# Patient Record
Sex: Female | Born: 1955 | Race: Black or African American | Hispanic: No | Smoking: Former smoker
Health system: Southern US, Community
[De-identification: ages and names within clinical notes are randomized; demographics above are authoritative.]

## PROBLEM LIST (undated history)

## (undated) DIAGNOSIS — M1990 Unspecified osteoarthritis, unspecified site: Secondary | ICD-10-CM

## (undated) DIAGNOSIS — J189 Pneumonia, unspecified organism: Secondary | ICD-10-CM

## (undated) DIAGNOSIS — D573 Sickle-cell trait: Secondary | ICD-10-CM

## (undated) DIAGNOSIS — C539 Malignant neoplasm of cervix uteri, unspecified: Secondary | ICD-10-CM

## (undated) DIAGNOSIS — E785 Hyperlipidemia, unspecified: Secondary | ICD-10-CM

## (undated) DIAGNOSIS — M109 Gout, unspecified: Secondary | ICD-10-CM

## (undated) DIAGNOSIS — Z720 Tobacco use: Secondary | ICD-10-CM

## (undated) DIAGNOSIS — F419 Anxiety disorder, unspecified: Secondary | ICD-10-CM

## (undated) DIAGNOSIS — E119 Type 2 diabetes mellitus without complications: Secondary | ICD-10-CM

## (undated) DIAGNOSIS — Z9889 Other specified postprocedural states: Secondary | ICD-10-CM

## (undated) DIAGNOSIS — K8020 Calculus of gallbladder without cholecystitis without obstruction: Secondary | ICD-10-CM

## (undated) DIAGNOSIS — Z9981 Dependence on supplemental oxygen: Secondary | ICD-10-CM

## (undated) DIAGNOSIS — G4733 Obstructive sleep apnea (adult) (pediatric): Secondary | ICD-10-CM

## (undated) DIAGNOSIS — J449 Chronic obstructive pulmonary disease, unspecified: Secondary | ICD-10-CM

## (undated) DIAGNOSIS — I10 Essential (primary) hypertension: Secondary | ICD-10-CM

## (undated) DIAGNOSIS — D649 Anemia, unspecified: Secondary | ICD-10-CM

## (undated) DIAGNOSIS — Z9989 Dependence on other enabling machines and devices: Secondary | ICD-10-CM

## (undated) DIAGNOSIS — I5032 Chronic diastolic (congestive) heart failure: Secondary | ICD-10-CM

## (undated) DIAGNOSIS — K219 Gastro-esophageal reflux disease without esophagitis: Secondary | ICD-10-CM

## (undated) DIAGNOSIS — R0902 Hypoxemia: Secondary | ICD-10-CM

## (undated) HISTORY — DX: Essential (primary) hypertension: I10

## (undated) HISTORY — DX: Obstructive sleep apnea (adult) (pediatric): G47.33

## (undated) HISTORY — DX: Other specified postprocedural states: Z98.890

## (undated) HISTORY — PX: CARDIAC CATHETERIZATION: SHX172

## (undated) HISTORY — DX: Dependence on other enabling machines and devices: Z99.89

## (undated) HISTORY — DX: Chronic obstructive pulmonary disease, unspecified: J44.9

## (undated) HISTORY — PX: TUBAL LIGATION: SHX77

## (undated) HISTORY — DX: Malignant neoplasm of cervix uteri, unspecified: C53.9

## (undated) HISTORY — PX: BREAST BIOPSY: SHX20

## (undated) HISTORY — DX: Chronic diastolic (congestive) heart failure: I50.32

---

## 1988-11-12 DIAGNOSIS — C539 Malignant neoplasm of cervix uteri, unspecified: Secondary | ICD-10-CM

## 1988-11-12 HISTORY — DX: Malignant neoplasm of cervix uteri, unspecified: C53.9

## 2007-03-16 ENCOUNTER — Emergency Department (HOSPITAL_COMMUNITY): Admission: EM | Admit: 2007-03-16 | Discharge: 2007-03-16 | Payer: Self-pay | Admitting: Emergency Medicine

## 2008-10-06 LAB — PULMONARY FUNCTION TEST

## 2010-07-31 LAB — PULMONARY FUNCTION TEST

## 2011-04-04 ENCOUNTER — Ambulatory Visit (INDEPENDENT_AMBULATORY_CARE_PROVIDER_SITE_OTHER): Payer: Self-pay | Admitting: Emergency Medicine

## 2011-04-04 ENCOUNTER — Encounter: Payer: Self-pay | Admitting: Emergency Medicine

## 2011-04-04 DIAGNOSIS — G4733 Obstructive sleep apnea (adult) (pediatric): Secondary | ICD-10-CM | POA: Insufficient documentation

## 2011-04-04 DIAGNOSIS — E119 Type 2 diabetes mellitus without complications: Secondary | ICD-10-CM | POA: Insufficient documentation

## 2011-04-04 DIAGNOSIS — J449 Chronic obstructive pulmonary disease, unspecified: Secondary | ICD-10-CM | POA: Insufficient documentation

## 2011-04-04 DIAGNOSIS — I10 Essential (primary) hypertension: Secondary | ICD-10-CM

## 2011-04-04 DIAGNOSIS — J309 Allergic rhinitis, unspecified: Secondary | ICD-10-CM | POA: Insufficient documentation

## 2011-04-04 MED ORDER — FLUTICASONE-SALMETEROL 250-50 MCG/DOSE IN AEPB: 1.0000 | INHALATION_SPRAY | Freq: Two times a day (BID) | RESPIRATORY_TRACT | Status: DC

## 2011-04-04 MED ORDER — TIOTROPIUM BROMIDE MONOHYDRATE 18 MCG IN CAPS: 18.0000 ug | ORAL_CAPSULE | Freq: Every day | RESPIRATORY_TRACT | Status: DC

## 2011-04-04 MED ORDER — TRIAMTERENE-HCTZ 37.5-25 MG PO CAPS: 1.0000 | ORAL_CAPSULE | Freq: Every day | ORAL | Status: DC

## 2011-04-04 NOTE — Progress Notes (Signed)
SATURATION QUALIFICATIONS:  Patient Saturations on Room Air at Rest = 94%  Patient Saturations on Room Air while Ambulating = 88%  Patient Saturations on 2 Liters of oxygen while Ambulating = 95%

## 2011-04-04 NOTE — Assessment & Plan Note (Signed)
Will fill her HTN meds until we can get her a local PCP

## 2011-04-04 NOTE — Assessment & Plan Note (Signed)
Needs to get copies of her PSG from [facility]

## 2011-04-04 NOTE — Patient Instructions (Addendum)
We will obtain copies of your records from [facility] Please continue your Advair and Spiriva Continue your oxygen as you are using it. We will refer you to a local DME company to get your oxygen.  Walking oximetry today We will fill your triamterene-HCTZ until you can get a local primary doctor You need to to quit smoking - continue your efforts to stop! Follow up with Dr Delton Coombes in 3 months

## 2011-04-04 NOTE — Assessment & Plan Note (Signed)
Walking oximetry today to qualify for O2 Refer to DME company for o2 and CPAP Advair + Spiriva Obtain old records

## 2011-04-04 NOTE — Progress Notes (Signed)
  Subjective:    Patient ID: Stephanie Schultz, female    DOB: June 01, 1956, 55 y.o.   MRN: [id number]  HPI 55 yo smoker (90 pk-yrs, has cut down to about 2 cig a day), hx HTN, borderline DM, allergies. Dx with COPD at [facility] in [place]. She was started on O2 in ~2009. She had PFT within the last year. She remains fairly active as long as she wears her O2. She can walk about 30-40 feet, has to stop when shopping to rest. She has daily cough, productive of white phlegm. She has rare exacerbations, last was in Jan 2012.   Review of Systems  HENT: Positive for ear pain.   Respiratory: Positive for cough and shortness of breath.   Gastrointestinal:       Heartburn  Musculoskeletal: Positive for joint swelling.  Neurological: Positive for headaches.  Psychiatric/Behavioral:       Anxiety   Past Medical History  Diagnosis Date  . Emphysema   . COPD (chronic obstructive pulmonary disease)   . OSA on CPAP   . Borderline diabetes   . Hypertension      Family History  Problem Relation Age of Onset  . Lung cancer Paternal Aunt   . Lung cancer Paternal Grandfather      History   Social History  . Marital Status: Single    Spouse Name: N/A    Number of Children: 3  . Years of Education: N/A   Occupational History  . Disabled     Emphysema   Social History Main Topics  . Smoking status: Current Everyday Smoker -- 0.2 packs/day for 35 years    Types: Cigarettes  . Smokeless tobacco: Never Used  . Alcohol Use: No  . Drug Use: No  . Sexually Active: Not on file   Other Topics Concern  . Not on file   Social History Narrative  . No narrative on file     No Known Allergies   No outpatient prescriptions prior to visit.        Objective:   Physical Exam Gen: Pleasant, obese, in no distress,  normal affect  ENT: No lesions,  mouth clear,  oropharynx clear, no postnasal drip  Neck: No JVD, no TMG, no carotid bruits  Lungs: No use of accessory muscles, no dullness to  percussion, clear without rales or rhonchi  Cardiovascular: RRR, heart sounds normal, no murmur or gallops, no peripheral edema  Musculoskeletal: No deformities, no cyanosis or clubbing  Neuro: alert, non focal  Skin: Warm, no lesions or rashes, old scars on B UE's      Assessment & Plan:  COPD (chronic obstructive pulmonary disease) Walking oximetry today to qualify for O2 Refer to DME company for o2 and CPAP Advair + Spiriva Obtain old records  Obstructive sleep apnea Needs to get copies of her PSG from [place]  Hypertension Will fill her HTN meds until we can get her a local PCP

## 2011-04-24 ENCOUNTER — Encounter: Payer: Self-pay | Admitting: Emergency Medicine

## 2011-04-25 ENCOUNTER — Telehealth: Payer: Self-pay | Admitting: Emergency Medicine

## 2011-04-25 NOTE — Telephone Encounter (Signed)
Per pt's chart, looks like this was already called in on 04/04/11 for # 30 x 11 refills. Called and spoke with Beth from [facility] and was informed they did indeed receive this rx but kept it on hold until pt was ready for refill.  Called and spoke with pt and informed her of this.  Nothing further needed.

## 2011-07-05 ENCOUNTER — Ambulatory Visit (INDEPENDENT_AMBULATORY_CARE_PROVIDER_SITE_OTHER): Payer: Self-pay | Admitting: Emergency Medicine

## 2011-07-05 ENCOUNTER — Encounter: Payer: Self-pay | Admitting: Emergency Medicine

## 2011-07-05 DIAGNOSIS — J4489 Other specified chronic obstructive pulmonary disease: Secondary | ICD-10-CM

## 2011-07-05 DIAGNOSIS — J449 Chronic obstructive pulmonary disease, unspecified: Secondary | ICD-10-CM

## 2011-07-05 DIAGNOSIS — G4733 Obstructive sleep apnea (adult) (pediatric): Secondary | ICD-10-CM

## 2011-07-05 MED ORDER — ALBUTEROL SULFATE HFA 108 (90 BASE) MCG/ACT IN AERS: 2.0000 | INHALATION_SPRAY | RESPIRATORY_TRACT | Status: DC | PRN

## 2011-07-05 MED ORDER — ALBUTEROL SULFATE (2.5 MG/3ML) 0.083% IN NEBU: 2.5000 mg | INHALATION_SOLUTION | RESPIRATORY_TRACT | Status: DC | PRN

## 2011-07-05 NOTE — Progress Notes (Signed)
  Subjective:    Patient ID: Stephanie Schultz, female    DOB: 1956-10-16, 55 y.o.   MRN: [id number]  HPI 55 yo smoker (90 pk-yrs, has cut down to about 2 cig a day), hx HTN, borderline DM, allergies. Dx with COPD at [facility] in [place]. She was started on O2 in ~2009. She had PFT within the last year. She remains fairly active as long as she wears her O2. She can walk about 30-40 feet, has to stop when shopping to rest. She has daily cough, productive of white phlegm. She has rare exacerbations, last was in Jan 2012.   ROV 07/05/11 -- COPD, OSA. Her data from [facility] showed she needs CPAP 10. We ordered O2 thru [facility]. She continues to smoke 3 cigs a day. She has been coughing more, having more sputum - yellow/white sputum.  She is having more dyspnea, more wheezing x 3 weeks. She has gained 30+ lbs since coming to [place]. She has not been wearing her O2 reliably.     Objective:   Physical Exam Gen: Pleasant, obese, in no distress,  normal affect  ENT: No lesions,  mouth clear,  oropharynx clear, no postnasal drip  Neck: No JVD, no TMG, no carotid bruits  Lungs: No use of accessory muscles, distant, no wheezes but she does have UA   Cardiovascular: RRR, heart sounds normal, no murmur or gallops, no peripheral edema  Musculoskeletal: No deformities, no cyanosis or clubbing  Neuro: alert, non focal  Skin: Warm, no lesions or rashes, old scars on B UE's      Assessment & Plan:  COPD (chronic obstructive pulmonary disease) Do not believe she is exacerbated at this time.  - encouraged her to use O2 more reloiably w exertion - Spiriva + Advair - need to get her prn albuterol nebs + HFA - rov 3 months   Obstructive sleep apnea Re[port from [facility] indicates she need CPAP 10cmH2O, will order maintenance thru [facility]

## 2011-07-05 NOTE — Patient Instructions (Signed)
Please continue Spiriva and Advair We will have [facility] maintain your CPAP, and will ask them to provide nebulized albuterol Please wear your oxygen with all exertion.  Follow up with Dr Delton Coombes in 3 months or sooner if you have any problems

## 2011-07-05 NOTE — Assessment & Plan Note (Signed)
Do not believe she is exacerbated at this time.  - encouraged her to use O2 more reloiably w exertion - Spiriva + Advair - need to get her prn albuterol nebs + HFA - rov 3 months

## 2011-07-05 NOTE — Assessment & Plan Note (Signed)
Re[port from [facility] indicates she need CPAP 10cmH2O, will order maintenance thru [facility]

## 2011-09-20 ENCOUNTER — Encounter: Payer: Self-pay | Admitting: Emergency Medicine

## 2011-09-20 ENCOUNTER — Ambulatory Visit (INDEPENDENT_AMBULATORY_CARE_PROVIDER_SITE_OTHER): Payer: Medicaid Other | Admitting: Emergency Medicine

## 2011-09-20 DIAGNOSIS — J449 Chronic obstructive pulmonary disease, unspecified: Secondary | ICD-10-CM

## 2011-09-20 DIAGNOSIS — Z23 Encounter for immunization: Secondary | ICD-10-CM

## 2011-09-20 DIAGNOSIS — R911 Solitary pulmonary nodule: Secondary | ICD-10-CM | POA: Insufficient documentation

## 2011-09-20 DIAGNOSIS — G4733 Obstructive sleep apnea (adult) (pediatric): Secondary | ICD-10-CM

## 2011-09-20 DIAGNOSIS — J984 Other disorders of lung: Secondary | ICD-10-CM

## 2011-09-20 DIAGNOSIS — K219 Gastro-esophageal reflux disease without esophagitis: Secondary | ICD-10-CM

## 2011-09-20 DIAGNOSIS — J309 Allergic rhinitis, unspecified: Secondary | ICD-10-CM

## 2011-09-20 NOTE — Progress Notes (Signed)
  Subjective:    Patient ID: Stephanie Schultz, female    DOB: 1956/01/11, 55 y.o.   MRN: [id number]  HPI 55 yo smoker (90 pk-yrs, has cut down to about 2 cig a day), hx HTN, borderline DM, allergies. Dx with COPD at [facility] in [place]. She was started on O2 in ~2009. She had PFT within the last year. She remains fairly active as long as she wears her O2. She can walk about 30-40 feet, has to stop when shopping to rest. She has daily cough, productive of white phlegm. She has rare exacerbations, last was in Jan 2012.   ROV 07/05/11 -- COPD, OSA. Her data from [facility] showed she needs CPAP 10. We ordered O2 thru [facility]. She continues to smoke 3 cigs a day. She has been coughing more, having more sputum - yellow/white sputum.  She is having more dyspnea, more wheezing x 3 weeks. She has gained 30+ lbs since coming to [place]. She has not been wearing her O2 reliably.   ROV 09/20/11 -- COPD, OSA. Returns for f/u. Tells me that she has been rx for COPD exacerbation x 2 since our last visit. She tells me that her CXR showed "a spot on one of her lungs". Was done at [facility] on 08/23/11. She was treated with pred and azithro on each occasion. She is on Advair and Spiriva. Continues to have severe cough, costochondritic pain. The pred and azithro helped some but not completely. Ran out loratadine 2 weeks ago. Still on omeprazole bid. She is wearing CPAP 10 reliably.     Objective:   Physical Exam Gen: Pleasant, obese, in no distress,  normal affect  ENT: No lesions,  mouth clear,  oropharynx clear, no postnasal drip  Neck: No JVD, no TMG, no carotid bruits  Lungs: No use of accessory muscles, distant, no wheezes but she does have UA   Cardiovascular: RRR, heart sounds normal, no murmur or gallops, no peripheral edema  Musculoskeletal: No deformities, no cyanosis or clubbing  Neuro: alert, non focal  Skin: Warm, no lesions or rashes, old scars on B UE's      Assessment & Plan:  COPD (chronic  obstructive pulmonary disease) May have had 2 exacerbations since last time, but I suspect that this was UA instability and cough leading to SOB. Need to control her UA irritation as well as her COPD - trial changing advair to dulera to see if better tolerated - continue spiriva - prn saba  - flu shot - needs pneumovax after age 34 - rov 1 month  Chronic allergic rhinitis Restart loratadine  Obstructive sleep apnea CPAP 10  GERD (gastroesophageal reflux disease) Omeprazole bid  Pulmonary nodule Will obtain the CXR from [place] and initiate workup from there if nodule present

## 2011-09-20 NOTE — Progress Notes (Signed)
Addended by: Julaine Hua on: 09/20/2011 04:12 PM   Modules accepted: Orders

## 2011-09-20 NOTE — Assessment & Plan Note (Signed)
May have had 2 exacerbations since last time, but I suspect that this was UA instability and cough leading to SOB. Need to control her UA irritation as well as her COPD - trial changing advair to dulera to see if better tolerated - continue spiriva - prn saba  - flu shot - needs pneumovax after age 55 - rov 1 month

## 2011-09-20 NOTE — Assessment & Plan Note (Signed)
Omeprazole bid 

## 2011-09-20 NOTE — Patient Instructions (Signed)
We will obtain your CXR from [facility] Change Advair to [facility] 200/5, 2 puffs twice a day Continue Spiriva Restart loratadine daily Continue your omeprazole twice a day Wear your CPAP every night Flu shot today You need to stop smoking Follow up in 1 month

## 2011-09-20 NOTE — Assessment & Plan Note (Signed)
Will obtain the CXR from [place] and initiate workup from there if nodule present

## 2011-09-20 NOTE — Assessment & Plan Note (Signed)
Restart loratadine

## 2011-09-20 NOTE — Assessment & Plan Note (Signed)
CPAP 10

## 2011-10-25 ENCOUNTER — Other Ambulatory Visit (INDEPENDENT_AMBULATORY_CARE_PROVIDER_SITE_OTHER): Payer: Medicaid Other

## 2011-10-25 ENCOUNTER — Encounter: Payer: Self-pay | Admitting: Emergency Medicine

## 2011-10-25 ENCOUNTER — Ambulatory Visit (INDEPENDENT_AMBULATORY_CARE_PROVIDER_SITE_OTHER): Payer: Medicaid Other | Admitting: Emergency Medicine

## 2011-10-25 DIAGNOSIS — J449 Chronic obstructive pulmonary disease, unspecified: Secondary | ICD-10-CM

## 2011-10-25 DIAGNOSIS — G4733 Obstructive sleep apnea (adult) (pediatric): Secondary | ICD-10-CM

## 2011-10-25 DIAGNOSIS — J984 Other disorders of lung: Secondary | ICD-10-CM

## 2011-10-25 DIAGNOSIS — K219 Gastro-esophageal reflux disease without esophagitis: Secondary | ICD-10-CM

## 2011-10-25 DIAGNOSIS — R911 Solitary pulmonary nodule: Secondary | ICD-10-CM

## 2011-10-25 DIAGNOSIS — J309 Allergic rhinitis, unspecified: Secondary | ICD-10-CM

## 2011-10-25 LAB — BASIC METABOLIC PANEL
CO2: 29 mEq/L (ref 19–32)
Calcium: 9.6 mg/dL (ref 8.4–10.5)
Chloride: 102 mEq/L (ref 96–112)
Glucose, Bld: 92 mg/dL (ref 70–99)
Potassium: 3.7 mEq/L (ref 3.5–5.1)
Sodium: 141 mEq/L (ref 135–145)

## 2011-10-25 MED ORDER — MOMETASONE FURO-FORMOTEROL FUM 200-5 MCG/ACT IN AERO: 2.0000 | INHALATION_SPRAY | Freq: Two times a day (BID) | RESPIRATORY_TRACT | Status: DC

## 2011-10-25 MED ORDER — FLUTICASONE PROPIONATE 50 MCG/ACT NA SUSP: 2.0000 | Freq: Two times a day (BID) | NASAL | Status: DC

## 2011-10-25 NOTE — Assessment & Plan Note (Signed)
Omeprazole bid 

## 2011-10-25 NOTE — Assessment & Plan Note (Signed)
Unclear whether this is a true nodule - will order CT scan of the chest at [facility]

## 2011-10-25 NOTE — Progress Notes (Signed)
Addended by: Michel Bickers A on: 10/25/2011 02:34 PM   Modules accepted: Orders

## 2011-10-25 NOTE — Assessment & Plan Note (Addendum)
Continue dulera and spiriva SABA prn She would like to be referred downstairs for new PCP but they are not taking medicaid.

## 2011-10-25 NOTE — Assessment & Plan Note (Signed)
Continue CPAP 10

## 2011-10-25 NOTE — Assessment & Plan Note (Signed)
Still problematic,  - add fluticasone to loratadine

## 2011-10-25 NOTE — Progress Notes (Signed)
  Subjective:    Patient ID: Stephanie Schultz, female    DOB: 1956-06-25, 56 y.o.   MRN: [id number]  HPI 55 yo smoker (90 pk-yrs, has cut down to about 2 cig a day), hx HTN, borderline DM, allergies. Dx with COPD at [facility] in [place]. She was started on O2 in ~2009. She had PFT within the last year. She remains fairly active as long as she wears her O2. She can walk about 30-40 feet, has to stop when shopping to rest. She has daily cough, productive of white phlegm. She has rare exacerbations, last was in Jan 2012.   ROV 07/05/11 -- COPD, OSA. Her data from [facility] showed she needs CPAP 10. We ordered O2 thru [facility]. She continues to smoke 3 cigs a day. She has been coughing more, having more sputum - yellow/white sputum.  She is having more dyspnea, more wheezing x 3 weeks. She has gained 30+ lbs since coming to [place]. She has not been wearing her O2 reliably.   ROV 09/20/11 -- COPD, OSA. Returns for f/u. Tells me that she has been rx for COPD exacerbation x 2 since our last visit. She tells me that her CXR showed "a spot on one of her lungs". Was done at [facility] on 08/23/11. She was treated with pred and azithro on each occasion. She is on Advair and Spiriva. Continues to have severe cough, costochondritic pain. The pred and azithro helped some but not completely. Ran out loratadine 2 weeks ago. Still on omeprazole bid. She is wearing CPAP 10 reliably.   ROV 10/25/11 -- COPD, OSA on CPAP 10. Last time we started [facility] in place of Advair to see if this helped w UA sx. Still on Spiriva. Restarted loratadine and continued omeprazole bid. She feels that her breathing is the same, cough is a bit better on the [facility] No flares, but she is still having nasal gtt despite the loratadine.     Objective:   Physical Exam Gen: Pleasant, obese, in no distress,  normal affect  ENT: No lesions,  mouth clear,  oropharynx clear, no postnasal drip  Neck: No JVD, no TMG, no carotid bruits  Lungs: No use of accessory  muscles, distant, no wheezes but she does have UA   Cardiovascular: RRR, heart sounds normal, no murmur or gallops, no peripheral edema  Musculoskeletal: No deformities, no cyanosis or clubbing  Neuro: alert, non focal  Skin: Warm, no lesions or rashes, old scars on B UE's      Assessment & Plan:  Chronic allergic rhinitis Still problematic,  - add fluticasone to loratadine  COPD (chronic obstructive pulmonary disease) Continue dulera and spiriva SABA prn She would like to be referred downstairs for new PCP but they are not taking medicaid.   Pulmonary nodule Unclear whether this is a true nodule - will order CT scan of the chest at [facility] street  Obstructive sleep apnea Continue CPAP 10  GERD (gastroesophageal reflux disease) Omeprazole bid

## 2011-10-25 NOTE — Patient Instructions (Addendum)
Continue your dulera 2 puffs twice a day and Spiriva once a day Continue your loratadine 10mg  daily Start fluticasone nasal spray 2 sprays each side twice a day Continue your CPAP every night Use your omeprazole 20mg  twice a day We will perform a CT scan of the chest to look for pulmonary nodule Follow with Dr Delton Coombes in 4 months or sooner if you have any problems.

## 2011-10-26 ENCOUNTER — Telehealth: Payer: Self-pay | Admitting: Emergency Medicine

## 2011-10-26 NOTE — Telephone Encounter (Signed)
Dulera APPROVED from 10/26/2011 to 10/25/2012 through [employer] at [phone]. Member ID # [id number] M. Pt has tried and failed Advair. Patient and pharmacy notified of approval.

## 2011-10-31 ENCOUNTER — Other Ambulatory Visit: Payer: Medicaid Other

## 2011-11-01 ENCOUNTER — Ambulatory Visit (INDEPENDENT_AMBULATORY_CARE_PROVIDER_SITE_OTHER)
Admission: RE | Admit: 2011-11-01 | Discharge: 2011-11-01 | Disposition: A | Discharge status: Home / Self Care | Payer: Medicaid Other | Source: Ambulatory Visit | Attending: Emergency Medicine | Admitting: Emergency Medicine

## 2011-11-01 DIAGNOSIS — J984 Other disorders of lung: Secondary | ICD-10-CM

## 2011-11-01 DIAGNOSIS — R911 Solitary pulmonary nodule: Secondary | ICD-10-CM

## 2011-11-01 MED ORDER — IOHEXOL 300 MG/ML  SOLN
80.0000 mL | Freq: Once | INTRAMUSCULAR | Status: AC | PRN
  Administered 2011-11-01: 80 mL via INTRAVENOUS

## 2011-11-07 ENCOUNTER — Telehealth: Payer: Self-pay | Admitting: Emergency Medicine

## 2011-11-07 NOTE — Telephone Encounter (Signed)
Patient has pending results and would like her CT result. Please advise. Stephanie Schultz

## 2011-11-16 NOTE — Telephone Encounter (Signed)
Contacted patient and patient is aware of CT result

## 2011-11-16 NOTE — Telephone Encounter (Signed)
Please inform patient that there is NO pulmonary nodule on her CT scan, it was a false alarm. Thank you

## 2011-11-26 ENCOUNTER — Telehealth: Payer: Self-pay | Admitting: Emergency Medicine

## 2011-11-26 DIAGNOSIS — J449 Chronic obstructive pulmonary disease, unspecified: Secondary | ICD-10-CM

## 2011-11-26 MED ORDER — ALBUTEROL SULFATE (2.5 MG/3ML) 0.083% IN NEBU: 2.5000 mg | INHALATION_SOLUTION | RESPIRATORY_TRACT | Status: DC | PRN

## 2011-11-26 NOTE — Telephone Encounter (Signed)
SPOKE WITH PATIENT REQUESTED RX FOR ALBUTEROL NEB SOLUTION. [facility] [place]

## 2011-12-05 ENCOUNTER — Other Ambulatory Visit: Payer: Self-pay

## 2011-12-05 ENCOUNTER — Emergency Department (HOSPITAL_COMMUNITY)
Admission: EM | Admit: 2011-12-05 | Discharge: 2011-12-05 | Disposition: A | Discharge status: Home / Self Care | Payer: Medicaid Other | Attending: Emergency Medicine | Admitting: Emergency Medicine

## 2011-12-05 ENCOUNTER — Encounter (HOSPITAL_COMMUNITY): Payer: Self-pay | Admitting: *Deleted

## 2011-12-05 ENCOUNTER — Emergency Department (HOSPITAL_COMMUNITY): Payer: Medicaid Other

## 2011-12-05 ENCOUNTER — Telehealth: Payer: Self-pay | Admitting: Emergency Medicine

## 2011-12-05 DIAGNOSIS — R042 Hemoptysis: Secondary | ICD-10-CM | POA: Insufficient documentation

## 2011-12-05 DIAGNOSIS — I10 Essential (primary) hypertension: Secondary | ICD-10-CM | POA: Insufficient documentation

## 2011-12-05 DIAGNOSIS — J449 Chronic obstructive pulmonary disease, unspecified: Secondary | ICD-10-CM

## 2011-12-05 DIAGNOSIS — R079 Chest pain, unspecified: Secondary | ICD-10-CM | POA: Insufficient documentation

## 2011-12-05 DIAGNOSIS — J438 Other emphysema: Secondary | ICD-10-CM | POA: Insufficient documentation

## 2011-12-05 DIAGNOSIS — R0602 Shortness of breath: Secondary | ICD-10-CM | POA: Insufficient documentation

## 2011-12-05 LAB — COMPREHENSIVE METABOLIC PANEL
Albumin: 4.2 g/dL (ref 3.5–5.2)
Alkaline Phosphatase: 52 U/L (ref 39–117)
BUN: 15 mg/dL (ref 6–23)
CO2: 28 mEq/L (ref 19–32)
Chloride: 98 mEq/L (ref 96–112)
Creatinine, Ser: 1.16 mg/dL — ABNORMAL HIGH (ref 0.50–1.10)
GFR calc non Af Amer: 53 mL/min — ABNORMAL LOW (ref >90)
Potassium: 3.8 mEq/L (ref 3.5–5.1)
Total Bilirubin: 0.2 mg/dL — ABNORMAL LOW (ref 0.3–1.2)

## 2011-12-05 LAB — CBC
HCT: 38.3 % (ref 36.0–46.0)
Hemoglobin: 13.7 g/dL (ref 12.0–15.0)
MCH: 29 pg (ref 26.0–34.0)
MCHC: 35.8 g/dL (ref 30.0–36.0)
MCV: 81.1 fL (ref 78.0–100.0)
RBC: 4.72 MIL/uL (ref 3.87–5.11)

## 2011-12-05 LAB — TROPONIN I: Troponin I: <0.3 ng/mL (ref <0.30)

## 2011-12-05 MED ORDER — ALBUTEROL SULFATE (5 MG/ML) 0.5% IN NEBU
2.5000 mg | INHALATION_SOLUTION | RESPIRATORY_TRACT | Status: AC
  Administered 2011-12-05: 2.5 mg via RESPIRATORY_TRACT
  Filled 2011-12-05: qty 0.5

## 2011-12-05 MED ORDER — AZITHROMYCIN 250 MG PO TABS: 250.0000 mg | ORAL_TABLET | Freq: Every day | ORAL | Status: AC

## 2011-12-05 MED ORDER — METHYLPREDNISOLONE SODIUM SUCC 125 MG IJ SOLR
125.0000 mg | INTRAMUSCULAR | Status: AC
  Administered 2011-12-05: 125 mg via INTRAVENOUS
  Filled 2011-12-05: qty 2

## 2011-12-05 MED ORDER — PREDNISONE 10 MG PO TABS: 50.0000 mg | ORAL_TABLET | Freq: Every day | ORAL | Status: AC

## 2011-12-05 NOTE — ED Provider Notes (Signed)
History     CSN: [id number]  Arrival date & time 12/05/11  1655   First MD Initiated Contact with Patient 12/05/11 1704      Chief Complaint  Patient presents with  . Hemoptysis    (Consider location/radiation/quality/duration/timing/severity/associated sxs/prior treatment) HPI This patient presents with hemoptysis.  She notes that over the past 3 weeks.  She has had intermittent pain focally about her lower sternum / epigastrum and left inframammary area.  The pain is described as a tightness, is worse with deep inspiration and with coughing.  No clear alleviating factors.  She also notes cough that is been persistent over the past 3 or 4 days.  Today during a coughing spell.  The patient received a small amount of blood in her sputum she denies any current fever, other abdominal pain. She does c/o new diarrhea, and mild anorexia.  Past Medical History  Diagnosis Date  . Emphysema   . COPD (chronic obstructive pulmonary disease)   . OSA on CPAP   . Borderline diabetes   . Hypertension     Past Surgical History  Procedure Date  . Tubal ligation   . Cardiac catheterization     Family History  Problem Relation Age of Onset  . Lung cancer Paternal Aunt   . Lung cancer Paternal Grandfather     History  Substance Use Topics  . Smoking status: Current Everyday Smoker -- 0.2 packs/day for 35 years    Types: Cigarettes  . Smokeless tobacco: Never Used   Comment: Approx 90 pk-yrs  . Alcohol Use: No    OB History    Grav Para Term Preterm Abortions TAB SAB Ect Mult Living                  Review of Systems  Constitutional:       HPI  HENT:       HPI otherwise negative  Eyes: Negative.   Respiratory:       HPI, otherwise negative  Cardiovascular:       HPI, otherwise nmegative  Gastrointestinal: Negative for vomiting.  Genitourinary:       HPI, otherwise negative  Musculoskeletal:       HPI, otherwise negative  Skin: Negative.   Neurological: Negative for  syncope.    Allergies  Review of patient's allergies indicates no known allergies.  Home Medications   Current Outpatient Rx  Name Route Sig Dispense Refill  . ALBUTEROL SULFATE HFA 108 (90 BASE) MCG/ACT IN AERS Inhalation Inhale 2 puffs into the lungs every 4 (four) hours as needed for shortness of breath. 1 Inhaler 11  . ALBUTEROL SULFATE (2.5 MG/3ML) 0.083% IN NEBU Nebulization Take 3 mLs (2.5 mg total) by nebulization every 4 (four) hours as needed for wheezing. 75 mL 12  . AMITRIPTYLINE HCL 10 MG PO TABS Oral Take 20 mg by mouth at bedtime.     . CYCLOBENZAPRINE HCL 10 MG PO TABS Oral Take 10 mg by mouth 3 (three) times daily as needed. For muscle spasms    . FLUTICASONE PROPIONATE 50 MCG/ACT NA SUSP Nasal Place 2 sprays into the nose 2 (two) times daily. 16 g 12  . LORATADINE 10 MG PO TABS Oral Take 10 mg by mouth daily.      . MOMETASONE FURO-FORMOTEROL FUM 200-5 MCG/ACT IN AERO Inhalation Inhale 2 puffs into the lungs 2 (two) times daily. 1 Inhaler 11  . OMEPRAZOLE 20 MG PO CPDR Oral Take 20 mg by mouth 2 (two)  times daily.      Marland Kitchen TIOTROPIUM BROMIDE MONOHYDRATE 18 MCG IN CAPS Inhalation Place 1 capsule (18 mcg total) into inhaler and inhale daily. 30 capsule 11  . TRIAMTERENE-HCTZ 37.5-25 MG PO CAPS Oral Take 1 capsule by mouth daily. 30 capsule 2  . VITAMIN B-12 50 MCG PO TABS Oral Take 50 mcg by mouth daily.        BP 131/61  Pulse 90  Temp(Src) 98 F (36.7 C) (Oral)  Resp 24  SpO2 98%  Physical Exam  Nursing note and vitals reviewed. Constitutional: She is oriented to person, place, and time. She appears well-developed and well-nourished. No distress.  HENT:  Head: Normocephalic and atraumatic.  Eyes: Conjunctivae and EOM are normal.  Cardiovascular: Normal rate and regular rhythm.   Pulmonary/Chest: Effort normal and breath sounds normal. No stridor. No respiratory distress.  Abdominal: She exhibits no distension.  Musculoskeletal: She exhibits no edema.    Neurological: She is alert and oriented to person, place, and time. No cranial nerve deficit.  Skin: Skin is warm and dry.  Psychiatric: She has a normal mood and affect.    ED Course  Procedures (including critical care time)   Labs Reviewed  CBC  TROPONIN I  LIPASE, BLOOD  COMPREHENSIVE METABOLIC PANEL   No results found.   No diagnosis found.   Pulse ox 99% on 2l [place], abnormal  Smoking cessation counseling provided   Date: 12/05/2011  Rate: 84  Rhythm: normal sinus rhythm  QRS Axis: normal  Intervals: normal  ST/T Wave abnormalities: normal  Conduction Disutrbances:none  Narrative Interpretation:   Old EKG Reviewed: none available NORMAL   CXR: reviewed by me  MDM  This 56 year old female with COPD, now presents with chest pain, and dyspnea.  On exam, she is in no distress, and her VS are unremarkable for her ([place] noted).  The patient 's labs are reassuring, w minor elevations in her transaminases.  Her CXR is c/w emphysematous changes.  This presentation is most c/w progression of her COPD.  She received ED nebulizer treatment and steroids.  D/C home in stable condition.        Gerhard Munch, MD 12/05/11 [phone]

## 2011-12-05 NOTE — ED Notes (Signed)
[id number]<JY> Expected date:<BR> Expected time:<BR> Means of arrival:<BR> Comments:<BR> Pt is in endoscopy, so must hold bed

## 2011-12-05 NOTE — ED Notes (Signed)
Per EMS, pt from home, pt was sitting in the couch upon arrival, reports coughing up very small amount of blood.  Pt wears home O2 at 2L [place].  Pt also reports sternal chest pain when coughing.

## 2011-12-05 NOTE — ED Notes (Signed)
Pt alert and oriented x4. Respirations even and unlabored, bilateral symmetrical rise and fall of chest. Skin warm and dry. In no acute distress. Denies needs.   

## 2011-12-05 NOTE — Telephone Encounter (Signed)
Spoke with pt. She is c/o nasal congestion and nose bleeds on and off x 1 month. She states that she has also been noticing increased SOB and has had prod cough since this am with blood streaked sputum. No fever. Advised needs ov- offered appt for today and she states unable to come in then due to transoprtation issues and so scheduled ov with TP for 9:45 am tomorrow. Advised seek emergency care sooner if needed.

## 2011-12-06 ENCOUNTER — Ambulatory Visit: Payer: Medicaid Other | Admitting: Adult Health

## 2011-12-06 ENCOUNTER — Telehealth: Payer: Self-pay | Admitting: Emergency Medicine

## 2011-12-06 DIAGNOSIS — J309 Allergic rhinitis, unspecified: Secondary | ICD-10-CM

## 2011-12-06 DIAGNOSIS — I10 Essential (primary) hypertension: Secondary | ICD-10-CM

## 2011-12-06 DIAGNOSIS — J449 Chronic obstructive pulmonary disease, unspecified: Secondary | ICD-10-CM

## 2011-12-06 MED ORDER — ALBUTEROL SULFATE (2.5 MG/3ML) 0.083% IN NEBU: 2.5000 mg | INHALATION_SOLUTION | RESPIRATORY_TRACT | Status: DC | PRN

## 2011-12-06 MED ORDER — ALBUTEROL SULFATE HFA 108 (90 BASE) MCG/ACT IN AERS: 2.0000 | INHALATION_SPRAY | RESPIRATORY_TRACT | Status: DC | PRN

## 2011-12-06 MED ORDER — TRIAMTERENE-HCTZ 37.5-25 MG PO CAPS: 1.0000 | ORAL_CAPSULE | Freq: Every day | ORAL | Status: DC

## 2011-12-06 MED ORDER — FLUTICASONE PROPIONATE 50 MCG/ACT NA SUSP: 2.0000 | Freq: Two times a day (BID) | NASAL | Status: DC

## 2011-12-06 MED ORDER — MOMETASONE FURO-FORMOTEROL FUM 200-5 MCG/ACT IN AERO: 2.0000 | INHALATION_SPRAY | Freq: Two times a day (BID) | RESPIRATORY_TRACT | Status: DC

## 2011-12-06 MED ORDER — TIOTROPIUM BROMIDE MONOHYDRATE 18 MCG IN CAPS: 18.0000 ug | ORAL_CAPSULE | Freq: Every day | RESPIRATORY_TRACT | Status: DC

## 2011-12-06 NOTE — Telephone Encounter (Signed)
I spoke with pt and she stated she needs all her rx's [facility] prescribes for her sent to [facility]. She states she has moved and now lives in [place]. i have sent her albuerol inhaler, albuterol neb, dyazide, spiriva, flonase, dulera. Nothing further was needed

## 2011-12-10 ENCOUNTER — Ambulatory Visit: Payer: Medicaid Other | Admitting: Adult Health

## 2011-12-10 ENCOUNTER — Telehealth: Payer: Self-pay | Admitting: Emergency Medicine

## 2011-12-10 MED ORDER — LORATADINE 10 MG PO TABS: 10.0000 mg | ORAL_TABLET | Freq: Every day | ORAL | Status: DC

## 2011-12-10 NOTE — Telephone Encounter (Signed)
Pt requesting refills on Claritin, Amitryptyline and Flexeril.  Pt informed that I would send refill for Claritin but other meds would need to be refilled by primary md.  Pt verbalized understanding.

## 2011-12-17 ENCOUNTER — Encounter: Payer: Self-pay | Admitting: Adult Health

## 2011-12-17 ENCOUNTER — Ambulatory Visit (INDEPENDENT_AMBULATORY_CARE_PROVIDER_SITE_OTHER): Payer: Medicaid Other | Admitting: Adult Health

## 2011-12-17 VITALS — BP 106/66 | HR 90 | Temp 97.0°F | Ht 60.0 in | Wt 216.0 lb

## 2011-12-17 DIAGNOSIS — J449 Chronic obstructive pulmonary disease, unspecified: Secondary | ICD-10-CM

## 2011-12-17 MED ORDER — CEFDINIR 300 MG PO CAPS: 300.0000 mg | ORAL_CAPSULE | Freq: Two times a day (BID) | ORAL | Status: DC

## 2011-12-17 NOTE — Progress Notes (Signed)
Addended by: Boone Master E on: 12/17/2011 02:56 PM   Modules accepted: Orders

## 2011-12-17 NOTE — Assessment & Plan Note (Signed)
Flare  Encouraged on smoking cesstation  Omnicef 300mg  Twice daily  For 7 days  Mucinex DM Twice daily  As needed  Cough/congestion  Fluids and rest  Please contact office for sooner follow up if symptoms do not improve or worsen or seek emergency care  follow up Dr. Delton Coombes  In 2 months and As needed

## 2011-12-17 NOTE — Patient Instructions (Addendum)
Omnicef 300mg  Twice daily  For 7 days  Mucinex DM Twice daily  As needed  Cough/congestion  Fluids and rest  Please contact office for sooner follow up if symptoms do not improve or worsen or seek emergency care  follow up Dr. Delton Coombes  In 2 months and As needed

## 2011-12-17 NOTE — Progress Notes (Signed)
Subjective:    Patient ID: Stephanie Schultz, female    DOB: 1956/03/28, 56 y.o.   MRN: [id number]  HPI 56 yo smoker (90 pk-yrs, has cut down to about 2 cig a day), hx HTN, borderline DM, allergies. Dx with COPD at [facility] in [place]. She was started on O2 in ~2009. She had PFT within the last year. She remains fairly active as long as she wears her O2. She can walk about 30-40 feet, has to stop when shopping to rest. She has daily cough, productive of white phlegm. She has rare exacerbations, last was in Jan 2012.   ROV 07/05/11 -- COPD, OSA. Her data from [facility] showed she needs CPAP 10. We ordered O2 thru [facility]. She continues to smoke 3 cigs a day. She has been coughing more, having more sputum - yellow/white sputum.  She is having more dyspnea, more wheezing x 3 weeks. She has gained 30+ lbs since coming to [place]. She has not been wearing her O2 reliably.   ROV 09/20/11 -- COPD, OSA. Returns for f/u. Tells me that she has been rx for COPD exacerbation x 2 since our last visit. She tells me that her CXR showed "a spot on one of her lungs". Was done at [facility] ER on 08/23/11. She was treated with pred and azithro on each occasion. She is on Advair and Spiriva. Continues to have severe cough, costochondritic pain. The pred and azithro helped some but not completely. Ran out loratadine 2 weeks ago. Still on omeprazole bid. She is wearing CPAP 10 reliably.   ROV 10/25/11 -- COPD, OSA on CPAP 10. Last time we started [facility] in place of Advair to see if this helped w UA sx. Still on Spiriva. Restarted loratadine and continued omeprazole bid. She feels that her breathing is the same, cough is a bit better on the [facility]. No flares, but she is still having nasal gtt despite the loratadine.   12/17/2011 Acute OV  Pt presents for an acute office visit, complains of increased SOB, wheezing, prod cough with yellow-to-brown mucus  2 weeks . Went to ED on 1.23.13 and was given zpak and prednisone 50mg  x 4 days. CXR with  chronic changing. Has finished abx and steroids. Got some better but cough never went away. Still smoking 1/2 PPD (takes O2 off -goes outside to smoke) .  Remains on Dulera and Spiriva  With no missed doses.  OTC not helping.  Wants referral for home health.  Encouraged to quit smoking.   ROS:  Constitutional:   No  weight loss, night sweats,  Fevers, chills,  +fatigue, or  lassitude.  HEENT:   No headaches,  Difficulty swallowing,  Tooth/dental problems, or  Sore throat,                No sneezing, itching, ear ache, nasal congestion, post nasal drip,   CV:  No chest pain,  Orthopnea, PND, swelling in lower extremities, anasarca, dizziness, palpitations, syncope.   GI  No heartburn, indigestion, abdominal pain, nausea, vomiting, diarrhea, change in bowel habits, loss of appetite, bloody stools.   Resp:  No coughing up of blood.    No chest wall deformity  Skin: no rash or lesions.  GU: no dysuria, change in color of urine, no urgency or frequency.  No flank pain, no hematuria   MS:  No joint pain or swelling.  No decreased range of motion.  No back pain.  Psych:  No change in mood or affect.  No depression or anxiety.  No memory loss.        Objective:   Physical Exam Gen: Pleasant, obese, in no distress,  normal affect  ENT: No lesions,  mouth clear,  oropharynx clear, no postnasal drip  Neck: No JVD, no TMG, no carotid bruits  Lungs: No use of accessory muscles, distant, coarse BS , no wheezes   Cardiovascular: RRR, heart sounds normal, no murmur or gallops, no peripheral edema  Musculoskeletal: No deformities, no cyanosis or clubbing  Neuro: alert, non focal  Skin: Warm, no lesions or rashes, old scars on B UE's      Assessment & Plan:

## 2011-12-26 ENCOUNTER — Encounter (HOSPITAL_COMMUNITY): Payer: Self-pay | Admitting: Emergency Medicine

## 2011-12-26 ENCOUNTER — Emergency Department (HOSPITAL_COMMUNITY): Payer: Medicaid Other

## 2011-12-26 ENCOUNTER — Emergency Department (HOSPITAL_COMMUNITY)
Admission: EM | Admit: 2011-12-26 | Discharge: 2011-12-26 | Disposition: A | Discharge status: Home / Self Care | Payer: Medicaid Other | Attending: Emergency Medicine | Admitting: Emergency Medicine

## 2011-12-26 ENCOUNTER — Other Ambulatory Visit: Payer: Self-pay

## 2011-12-26 DIAGNOSIS — R059 Cough, unspecified: Secondary | ICD-10-CM | POA: Insufficient documentation

## 2011-12-26 DIAGNOSIS — Z79899 Other long term (current) drug therapy: Secondary | ICD-10-CM | POA: Insufficient documentation

## 2011-12-26 DIAGNOSIS — J449 Chronic obstructive pulmonary disease, unspecified: Secondary | ICD-10-CM | POA: Insufficient documentation

## 2011-12-26 DIAGNOSIS — Z9981 Dependence on supplemental oxygen: Secondary | ICD-10-CM | POA: Insufficient documentation

## 2011-12-26 DIAGNOSIS — R5381 Other malaise: Secondary | ICD-10-CM | POA: Insufficient documentation

## 2011-12-26 DIAGNOSIS — K7689 Other specified diseases of liver: Secondary | ICD-10-CM | POA: Insufficient documentation

## 2011-12-26 DIAGNOSIS — J4489 Other specified chronic obstructive pulmonary disease: Secondary | ICD-10-CM | POA: Insufficient documentation

## 2011-12-26 DIAGNOSIS — I517 Cardiomegaly: Secondary | ICD-10-CM | POA: Insufficient documentation

## 2011-12-26 DIAGNOSIS — R0602 Shortness of breath: Secondary | ICD-10-CM | POA: Insufficient documentation

## 2011-12-26 DIAGNOSIS — R509 Fever, unspecified: Secondary | ICD-10-CM | POA: Insufficient documentation

## 2011-12-26 DIAGNOSIS — R05 Cough: Secondary | ICD-10-CM | POA: Insufficient documentation

## 2011-12-26 LAB — COMPREHENSIVE METABOLIC PANEL WITH GFR
ALT: 68 U/L — ABNORMAL HIGH (ref 0–35)
AST: 49 U/L — ABNORMAL HIGH (ref 0–37)
Albumin: 4.3 g/dL (ref 3.5–5.2)
Alkaline Phosphatase: 58 U/L (ref 39–117)
BUN: 14 mg/dL (ref 6–23)
CO2: 27 meq/L (ref 19–32)
Calcium: 10.4 mg/dL (ref 8.4–10.5)
Chloride: 100 meq/L (ref 96–112)
Creatinine, Ser: 1.06 mg/dL (ref 0.50–1.10)
GFR calc Af Amer: 68 mL/min — ABNORMAL LOW
GFR calc non Af Amer: 59 mL/min — ABNORMAL LOW
Glucose, Bld: 99 mg/dL (ref 70–99)
Potassium: 3.8 meq/L (ref 3.5–5.1)
Sodium: 140 meq/L (ref 135–145)
Total Bilirubin: 0.2 mg/dL — ABNORMAL LOW (ref 0.3–1.2)
Total Protein: 9.1 g/dL — ABNORMAL HIGH (ref 6.0–8.3)

## 2011-12-26 LAB — URINALYSIS, ROUTINE W REFLEX MICROSCOPIC
Bilirubin Urine: NEGATIVE
Glucose, UA: NEGATIVE mg/dL
Hgb urine dipstick: NEGATIVE
Ketones, ur: NEGATIVE mg/dL
Leukocytes, UA: NEGATIVE
Nitrite: NEGATIVE
Protein, ur: NEGATIVE mg/dL
Specific Gravity, Urine: 1.015 (ref 1.005–1.030)
Urobilinogen, UA: 0.2 mg/dL (ref 0.0–1.0)
pH: 6.5 (ref 5.0–8.0)

## 2011-12-26 LAB — D-DIMER, QUANTITATIVE: D-Dimer, Quant: 0.97 ug/mL-FEU — ABNORMAL HIGH (ref 0.00–0.48)

## 2011-12-26 LAB — PRO B NATRIURETIC PEPTIDE: Pro B Natriuretic peptide (BNP): 5 pg/mL (ref 0–125)

## 2011-12-26 LAB — CBC
Hemoglobin: 14.2 g/dL (ref 12.0–15.0)
MCH: 29.5 pg (ref 26.0–34.0)
MCHC: 36.2 g/dL — ABNORMAL HIGH (ref 30.0–36.0)
RDW: 14.9 % (ref 11.5–15.5)

## 2011-12-26 LAB — LIPASE, BLOOD: Lipase: 24 U/L (ref 11–59)

## 2011-12-26 LAB — POCT I-STAT TROPONIN I: Troponin i, poc: 0 ng/mL (ref 0.00–0.08)

## 2011-12-26 MED ORDER — IOHEXOL 300 MG/ML  SOLN
100.0000 mL | Freq: Once | INTRAMUSCULAR | Status: AC | PRN
  Administered 2011-12-26: 70 mL via INTRAVENOUS

## 2011-12-26 MED ORDER — IPRATROPIUM BROMIDE 0.02 % IN SOLN
0.5000 mg | Freq: Once | RESPIRATORY_TRACT | Status: AC
  Administered 2011-12-26: 0.5 mg via RESPIRATORY_TRACT
  Filled 2011-12-26: qty 2.5

## 2011-12-26 MED ORDER — ALBUTEROL SULFATE (5 MG/ML) 0.5% IN NEBU
5.0000 mg | INHALATION_SOLUTION | Freq: Once | RESPIRATORY_TRACT | Status: AC
  Administered 2011-12-26: 5 mg via RESPIRATORY_TRACT
  Filled 2011-12-26: qty 1

## 2011-12-26 MED ORDER — METHYLPREDNISOLONE SODIUM SUCC 125 MG IJ SOLR
125.0000 mg | Freq: Once | INTRAMUSCULAR | Status: AC
  Administered 2011-12-26: 125 mg via INTRAVENOUS
  Filled 2011-12-26: qty 2

## 2011-12-26 MED ORDER — PREDNISONE 10 MG PO TABS: 20.0000 mg | ORAL_TABLET | Freq: Every day | ORAL | Status: DC

## 2011-12-26 NOTE — ED Notes (Signed)
Per pt.  Pt was seen last Friday for cough.  Pt states she was given abx but has not seen relief.  Pt states she has had fever, chills, non productive cough and sob.  Pt has COPD and normally on 2L.  Lungs clear bilaterally.  Pt dyspneic.  100% on 6L.  Pt also has sharp pain in LUQ.

## 2011-12-26 NOTE — Discharge Instructions (Signed)
Return to the ED with any concerns including difficulty breathing, chest pain, swelling of your legs, vomiting and not able to keep down liquids, decreased level of alertness or lethargy, or any other alarming symptoms.

## 2011-12-26 NOTE — ED Notes (Signed)
Patient transported to CT 

## 2011-12-26 NOTE — ED Provider Notes (Signed)
History     CSN: [id number]  Arrival date & time 12/26/11  1543   First MD Initiated Contact with Patient 12/26/11 1631      Chief Complaint  Patient presents with  . Shortness of Breath  . Cough  . Abdominal Pain    (Consider location/radiation/quality/duration/timing/severity/associated sxs/prior treatment) HPI Patient presents with complaint of cough and shortness of breath. She states that she has a history of COPD and uses 2 L of oxygen at baseline at home. She has been coughing up a small amount of sputum. She feels that her symptoms are wo swelling in her legs. She denies any chest pain. She also complains of intermittent sharp pains in her left upper abdomen which she describes as "knots". She states that she has had muscle spasms in the past. These pains come and go and upon my evaluation she has no abdominal pain or tenderness. She denies any fever or chills. She was seen recently in the ED and diagnosed with a COPD exacerbation. She was then followed with her primary Dr. who started her on Cefdinir. She is almost finished with the course of antibiotics and will be finished tomorrow.  There are no other associated systemic symptoms, there are no alleviating or modifying factors.    Past Medical History  Diagnosis Date  . Emphysema   . COPD (chronic obstructive pulmonary disease)   . OSA on CPAP   . Borderline diabetes   . Hypertension     Past Surgical History  Procedure Date  . Tubal ligation   . Cardiac catheterization     Family History  Problem Relation Age of Onset  . Lung cancer Paternal Aunt   . Lung cancer Paternal Grandfather     History  Substance Use Topics  . Smoking status: Current Everyday Smoker -- 0.2 packs/day for 35 years    Types: Cigarettes  . Smokeless tobacco: Never Used   Comment: Approx 90 pk-yrs  . Alcohol Use: No    OB History    Grav Para Term Preterm Abortions TAB SAB Ect Mult Living                  Review of Systems ROS  reviewed and otherwise negative except for mentioned in HPI  Allergies  Review of patient's allergies indicates no known allergies.  Home Medications   Current Outpatient Rx  Name Route Sig Dispense Refill  . ALBUTEROL SULFATE HFA 108 (90 BASE) MCG/ACT IN AERS Inhalation Inhale 2 puffs into the lungs every 4 (four) hours as needed for shortness of breath. 1 Inhaler 11  . ALBUTEROL SULFATE (2.5 MG/3ML) 0.083% IN NEBU Nebulization Take 2.5 mg by nebulization 2 (two) times daily.    Marland Kitchen AMITRIPTYLINE HCL 10 MG PO TABS Oral Take 20 mg by mouth at bedtime.     Marland Kitchen CEFDINIR 300 MG PO CAPS Oral Take 300 mg by mouth See admin instructions. Course started 12/21/11 for 7 day supply..Medication is being taken twice a day    . CYCLOBENZAPRINE HCL 10 MG PO TABS Oral Take 10 mg by mouth 3 (three) times daily as needed. For muscle spasms    . FLUTICASONE PROPIONATE 50 MCG/ACT NA SUSP Nasal Place 2 sprays into the nose 2 (two) times daily. 16 g 5  . LORATADINE 10 MG PO TABS Oral Take 1 tablet (10 mg total) by mouth daily. 30 tablet 5  . MOMETASONE FURO-FORMOTEROL FUM 200-5 MCG/ACT IN AERO Inhalation Inhale 2 puffs into the lungs 2 (two)  times daily. 1 Inhaler 5  . TIOTROPIUM BROMIDE MONOHYDRATE 18 MCG IN CAPS Inhalation Place 1 capsule (18 mcg total) into inhaler and inhale daily. 30 capsule 5  . TRIAMTERENE-HCTZ 37.5-25 MG PO CAPS Oral Take 1 each (1 capsule total) by mouth daily. 30 capsule 3  . VITAMIN B-12 50 MCG PO TABS Oral Take 50 mcg by mouth daily.      Marland Kitchen OMEPRAZOLE 20 MG PO CPDR Oral Take 1 capsule (20 mg total) by mouth 2 (two) times daily. 60 capsule 5  . PREDNISONE 10 MG PO TABS Oral Take 2 tablets (20 mg total) by mouth daily. 10 tablet 0    BP 136/78  Pulse 90  Temp(Src) 98 F (36.7 C) (Oral)  Resp 22  SpO2 100% Vitals reviewed Physical Exam Physical Examination: General appearance - alert, well appearing, and in no distress Mental status - alert, oriented to person, place, and time Mouth  - mucous membranes moist, pharynx normal without lesions Chest - clear to auscultation, no wheezes, rales or rhonchi, symmetric air entry Heart - normal rate, regular rhythm, normal S1, S2, no murmurs, rubs, clicks or gallops Abdomen - soft, nontender, nondistended, no masses or organomegaly Musculoskeletal - no joint tenderness, deformity or swelling Extremities - peripheral pulses normal, no pedal edema, no clubbing or cyanosis Skin - normal coloration and turgor, no rashes  ED Course  Procedures (including critical care time)   Date: 12/26/2011  Rate: 84  Rhythm: normal sinus rhythm  QRS Axis: normal  Intervals: normal  ST/T Wave abnormalities: normal  Conduction Disutrbances:none  Narrative Interpretation:   Old EKG Reviewed: unchanged    Labs Reviewed  CBC - Abnormal; Notable for the following:    MCHC 36.2 (*)    All other components within normal limits  COMPREHENSIVE METABOLIC PANEL - Abnormal; Notable for the following:    Total Protein 9.1 (*)    AST 49 (*)    ALT 68 (*)    Total Bilirubin 0.2 (*)    GFR calc non Af Amer 59 (*)    GFR calc Af Amer 68 (*)    All other components within normal limits  D-DIMER, QUANTITATIVE - Abnormal; Notable for the following:    D-Dimer, Quant 0.97 (*)    All other components within normal limits  LIPASE, BLOOD  URINALYSIS, ROUTINE W REFLEX MICROSCOPIC  PRO B NATRIURETIC PEPTIDE  POCT I-STAT TROPONIN I  LAB REPORT - SCANNED   Dg Chest 2 View  12/26/2011  *RADIOLOGY REPORT*  Clinical Data: Cough, shortness of breath, weakness.  CHEST - 2 VIEW  Comparison: 12/05/2011  Findings: Mild cardiomegaly.  No overt edema.  No focal opacity or effusion.  Degenerative changes in the thoracic spine.  IMPRESSION: Cardiomegaly.  No acute or active disease.  Original Report Authenticated By: Cyndie Chime, M.D.   Ct Angio Chest W/cm &/or Wo Cm  12/26/2011  *RADIOLOGY REPORT*  Clinical Data: Shortness of breath, cough, fever  CT ANGIOGRAPHY  CHEST  Technique:  Multidetector CT imaging of the chest using the standard protocol during bolus administration of intravenous contrast. Multiplanar reconstructed images including MIPs were obtained and reviewed to evaluate the vascular anatomy.  Contrast: 70mL OMNIPAQUE IOHEXOL 300 MG/ML IV SOLN  Comparison: 11/01/2011  Findings: There is good contrast opacification of the pulmonary artery branches.  No discrete filling defect to suggest acute PE.Patchy aortic arch calcifications. Good contrast opacification of the thoracic aorta with no evidence of dissection, aneurysm, or stenosis. There is classic 3-vessel  brachiocephalic arch anatomy. No pleural or pericardial effusion.  No hilar or mediastinal adenopathy.  Minimal linear scarring or subsegmental atelectasis in basilar segments of both lower lobes.  No confluent airspace infiltrate.  There is diffuse fatty infiltration of the liver with a small hypervascular lesion in the anterior right hepatic segment near the dome as before.  The remainder the visualized upper abdomen is unremarkable.  Flowing osteophytes across multiple contiguous levels in the mid and lower thoracic spine.  IMPRESSION:  1.  Negative for acute PE or thoracic aortic dissection. 2.  Fatty liver.  Original Report Authenticated By: Thora Lance III, M.D.     1. COPD (chronic obstructive pulmonary disease)       MDM  Patient presenting with cough and shortness of breath. She is currently taking antibiotics and completed a course of steroids approximately 2 weeks ago for COPD exacerbation. Her chest x-ray was reassuring today. Her d-dimer was elevated so CT angiogram for chest was obtained which was also reassuring, no evidence for PE. Patient states she's feeling improved after breathing treatments and steroids in ED period she was instructed to finish her course of Ceftin ER and will be given another round of oral steroids. She is to use her albuterol inhaler/nebulizer every 6  hours at home. She's given strict return precautions and is agreeable with the plan for discharge.        Ethelda Chick, MD 12/28/11 [phone]

## 2011-12-26 NOTE — ED Notes (Signed)
Patient stable upon discharge.  

## 2011-12-27 ENCOUNTER — Telehealth: Payer: Self-pay | Admitting: Emergency Medicine

## 2011-12-27 MED ORDER — OMEPRAZOLE 20 MG PO CPDR: 20.0000 mg | DELAYED_RELEASE_CAPSULE | Freq: Two times a day (BID) | ORAL | Status: DC

## 2011-12-27 NOTE — Telephone Encounter (Signed)
Left detailed message on pt's VM advising rx has been sent and tcb if anything further was needed

## 2011-12-27 NOTE — Telephone Encounter (Signed)
RX has been sent--atc pt to make aware but NA [facility]

## 2012-01-03 ENCOUNTER — Ambulatory Visit: Payer: Medicaid Other | Admitting: Adult Health

## 2012-02-18 ENCOUNTER — Ambulatory Visit (INDEPENDENT_AMBULATORY_CARE_PROVIDER_SITE_OTHER): Payer: Medicaid Other | Admitting: Emergency Medicine

## 2012-02-18 ENCOUNTER — Encounter: Payer: Self-pay | Admitting: Emergency Medicine

## 2012-02-18 VITALS — BP 124/72 | HR 88 | Temp 97.8°F | Ht 60.0 in | Wt 216.0 lb

## 2012-02-18 DIAGNOSIS — G4733 Obstructive sleep apnea (adult) (pediatric): Secondary | ICD-10-CM

## 2012-02-18 DIAGNOSIS — J309 Allergic rhinitis, unspecified: Secondary | ICD-10-CM

## 2012-02-18 DIAGNOSIS — K219 Gastro-esophageal reflux disease without esophagitis: Secondary | ICD-10-CM

## 2012-02-18 DIAGNOSIS — J449 Chronic obstructive pulmonary disease, unspecified: Secondary | ICD-10-CM

## 2012-02-18 MED ORDER — NICOTINE 7 MG/24HR TD PT24: 1.0000 | MEDICATED_PATCH | TRANSDERMAL | Status: AC

## 2012-02-18 NOTE — Assessment & Plan Note (Signed)
Continue PPI ?

## 2012-02-18 NOTE — Patient Instructions (Signed)
Please continue your inhaled medications and Oxygen as you are using them We discussed stopping smoking today - we will try to start nicotine patches and then stopping. Set your quit date and then start using the patches to help with the cravings Use your CPAP every night Continue your flonase nasal spray and restart your loratadine (Claritin) every day Follow with Dr Delton Coombes in 3 months

## 2012-02-18 NOTE — Assessment & Plan Note (Signed)
-   continue same BD regimen - O2 - discussed tobacco cessation.

## 2012-02-18 NOTE — Assessment & Plan Note (Signed)
Using flonase. Didn't continue loratadine, but is abou8t to restart  - nasal steroid + add back loratadiine.

## 2012-02-18 NOTE — Progress Notes (Signed)
Subjective:    Patient ID: Stephanie Schultz, female    DOB: January 20, 1956, 56 y.o.   MRN: [id number]  HPI 56 yo smoker (90 pk-yrs, has cut down to about 2 cig a day), hx HTN, borderline DM, allergies. Dx with COPD at [facility] in [place]. She was started on O2 in ~2009. She had PFT within the last year. She remains fairly active as long as she wears her O2. She can walk about 30-40 feet, has to stop when shopping to rest. She has daily cough, productive of white phlegm. She has rare exacerbations, last was in Jan 2012.   ROV 07/05/11 -- COPD, OSA. Her data from [facility] showed she needs CPAP 10. We ordered O2 thru [facility]. She continues to smoke 3 cigs a day. She has been coughing more, having more sputum - yellow/white sputum.  She is having more dyspnea, more wheezing x 3 weeks. She has gained 30+ lbs since coming to [place]. She has not been wearing her O2 reliably.   ROV 09/20/11 -- COPD, OSA. Returns for f/u. Tells me that she has been rx for COPD exacerbation x 2 since our last visit. She tells me that her CXR showed "a spot on one of her lungs". Was done at [facility] ER on 08/23/11. She was treated with pred and azithro on each occasion. She is on Advair and Spiriva. Continues to have severe cough, costochondritic pain. The pred and azithro helped some but not completely. Ran out loratadine 2 weeks ago. Still on omeprazole bid. She is wearing CPAP 10 reliably.   ROV 10/25/11 -- COPD, OSA on CPAP 10. Last time we started [facility] in place of Advair to see if this helped w UA sx. Still on Spiriva. Restarted loratadine and continued omeprazole bid. She feels that her breathing is the same, cough is a bit better on the [facility]. No flares, but she is still having nasal gtt despite the loratadine.   12/17/2011 Acute OV  Pt presents for an acute office visit, complains of increased SOB, wheezing, prod cough with yellow-to-brown mucus  2 weeks . Went to ED on 1.23.13 and was given zpak and prednisone 50mg  x 4 days. CXR with  chronic changing. Has finished abx and steroids. Got some better but cough never went away. Still smoking 1/2 PPD (takes O2 off -goes outside to smoke) .  Remains on Dulera and Spiriva  With no missed doses.  OTC not helping.  Wants referral for home health.  Encouraged to quit smoking.   ROV 02/18/12 -- COPD, OSA on CPAP, chronic cough. Had an AE with residual cough as described above 2/13. Remains on Spiriva + Dulera. Remains on omeprazole bid, flonase, not using loratadine right now. Still smoking about 3 cig a day. She is interested in trying the nicotine patches    Objective:   Physical Exam Gen: Pleasant, obese, in no distress,  normal affect  ENT: No lesions,  mouth clear,  oropharynx clear, no postnasal drip  Neck: No JVD, no TMG, no carotid bruits  Lungs: No use of accessory muscles, distant, coarse BS , no wheezes   Cardiovascular: RRR, heart sounds normal, no murmur or gallops, no peripheral edema  Musculoskeletal: No deformities, no cyanosis or clubbing  Neuro: alert, non focal  Skin: Warm, no lesions or rashes, old scars on B UE's   Assessment & Plan:  COPD (chronic obstructive pulmonary disease) - continue same BD regimen - O2 - discussed tobacco cessation.   Obstructive sleep apnea -  continue CPAP every night  GERD (gastroesophageal reflux disease) Continue PPI  Chronic allergic rhinitis Using flonase. Didn't continue loratadine, but is abou8t to restart  - nasal steroid + add back loratadiine.

## 2012-02-18 NOTE — Assessment & Plan Note (Signed)
continue CPAP every night 

## 2012-02-27 ENCOUNTER — Other Ambulatory Visit: Payer: Self-pay | Admitting: Internal Medicine

## 2012-02-27 DIAGNOSIS — Z1231 Encounter for screening mammogram for malignant neoplasm of breast: Secondary | ICD-10-CM

## 2012-03-04 ENCOUNTER — Ambulatory Visit
Admission: RE | Admit: 2012-03-04 | Discharge: 2012-03-04 | Disposition: A | Discharge status: Home / Self Care | Payer: Medicaid Other | Source: Ambulatory Visit | Attending: Internal Medicine | Admitting: Internal Medicine

## 2012-03-04 DIAGNOSIS — Z1231 Encounter for screening mammogram for malignant neoplasm of breast: Secondary | ICD-10-CM

## 2012-03-05 ENCOUNTER — Other Ambulatory Visit: Payer: Self-pay | Admitting: Internal Medicine

## 2012-03-05 DIAGNOSIS — N644 Mastodynia: Secondary | ICD-10-CM

## 2012-04-16 ENCOUNTER — Other Ambulatory Visit: Payer: Self-pay | Admitting: Emergency Medicine

## 2012-04-17 ENCOUNTER — Telehealth: Payer: Self-pay | Admitting: Emergency Medicine

## 2012-04-17 NOTE — Telephone Encounter (Signed)
RX for this was sent called in yesterday 04/16/12--pt aware and needed nothing further

## 2012-04-18 ENCOUNTER — Ambulatory Visit
Admission: RE | Admit: 2012-04-18 | Discharge: 2012-04-18 | Disposition: A | Discharge status: Home / Self Care | Payer: Medicaid Other | Source: Ambulatory Visit | Attending: Internal Medicine | Admitting: Internal Medicine

## 2012-04-18 ENCOUNTER — Other Ambulatory Visit: Payer: Self-pay | Admitting: Internal Medicine

## 2012-04-18 DIAGNOSIS — N644 Mastodynia: Secondary | ICD-10-CM

## 2012-04-18 DIAGNOSIS — N63 Unspecified lump in unspecified breast: Secondary | ICD-10-CM

## 2012-05-20 ENCOUNTER — Ambulatory Visit
Admission: RE | Admit: 2012-05-20 | Discharge: 2012-05-20 | Disposition: A | Discharge status: Home / Self Care | Payer: Medicaid Other | Source: Ambulatory Visit | Attending: Internal Medicine | Admitting: Internal Medicine

## 2012-05-20 DIAGNOSIS — N63 Unspecified lump in unspecified breast: Secondary | ICD-10-CM

## 2012-06-11 ENCOUNTER — Ambulatory Visit: Payer: Medicaid Other | Admitting: Emergency Medicine

## 2012-06-11 ENCOUNTER — Other Ambulatory Visit: Payer: Self-pay | Admitting: Emergency Medicine

## 2012-07-08 ENCOUNTER — Ambulatory Visit (INDEPENDENT_AMBULATORY_CARE_PROVIDER_SITE_OTHER): Payer: Medicaid Other | Admitting: Emergency Medicine

## 2012-07-08 ENCOUNTER — Encounter: Payer: Self-pay | Admitting: Emergency Medicine

## 2012-07-08 VITALS — BP 124/72 | HR 95 | Temp 98.7°F | Ht 60.0 in | Wt 220.2 lb

## 2012-07-08 DIAGNOSIS — K219 Gastro-esophageal reflux disease without esophagitis: Secondary | ICD-10-CM

## 2012-07-08 DIAGNOSIS — R040 Epistaxis: Secondary | ICD-10-CM

## 2012-07-08 DIAGNOSIS — G4733 Obstructive sleep apnea (adult) (pediatric): Secondary | ICD-10-CM

## 2012-07-08 DIAGNOSIS — J449 Chronic obstructive pulmonary disease, unspecified: Secondary | ICD-10-CM

## 2012-07-08 DIAGNOSIS — J309 Allergic rhinitis, unspecified: Secondary | ICD-10-CM

## 2012-07-08 MED ORDER — FLUTICASONE PROPIONATE 50 MCG/ACT NA SUSP: 2.0000 | Freq: Two times a day (BID) | NASAL | Status: DC

## 2012-07-08 NOTE — Patient Instructions (Addendum)
Please continue your oxygen and CPAP as you are using them Continue your Spiriva and Dulera.  Continue your omeprazole twice a day We will refer you to see the ENT doctor about your nose bleeds Restart your fluticasone nasal spray Follow with Dr Delton Coombes in 6 months or sooner if you have any problems

## 2012-07-08 NOTE — Progress Notes (Signed)
Subjective:    Patient ID: Stephanie Schultz, female    DOB: 19-Apr-1956, 56 y.o.   MRN: [id number]  HPI 56 yo smoker (90 pk-yrs, has cut down to about 2 cig a day), hx HTN, borderline DM, allergies. Dx with COPD at [facility] in [place]. She was started on O2 in ~2009. She had PFT within the last year. She remains fairly active as long as she wears her O2. She can walk about 30-40 feet, has to stop when shopping to rest. She has daily cough, productive of white phlegm. She has rare exacerbations, last was in Jan 2012.   ROV 07/05/11 -- COPD, OSA. Her data from [facility] showed she needs CPAP 10. We ordered O2 thru [facility]. She continues to smoke 3 cigs a day. She has been coughing more, having more sputum - yellow/white sputum.  She is having more dyspnea, more wheezing x 3 weeks. She has gained 30+ lbs since coming to [place]. She has not been wearing her O2 reliably.   ROV 09/20/11 -- COPD, OSA. Returns for f/u. Tells me that she has been rx for COPD exacerbation x 2 since our last visit. She tells me that her CXR showed "a spot on one of her lungs". Was done at [facility] ER on 08/23/11. She was treated with pred and azithro on each occasion. She is on Advair and Spiriva. Continues to have severe cough, costochondritic pain. The pred and azithro helped some but not completely. Ran out loratadine 2 weeks ago. Still on omeprazole bid. She is wearing CPAP 10 reliably.   ROV 10/25/11 -- COPD, OSA on CPAP 10. Last time we started [facility] in place of Advair to see if this helped w UA sx. Still on Spiriva. Restarted loratadine and continued omeprazole bid. She feels that her breathing is the same, cough is a bit better on the [facility]. No flares, but she is still having nasal gtt despite the loratadine.   12/17/2011 Acute OV  Pt presents for an acute office visit, complains of increased SOB, wheezing, prod cough with yellow-to-brown mucus  2 weeks . Went to ED on 1.23.13 and was given zpak and prednisone 50mg  x 4 days. CXR with  chronic changing. Has finished abx and steroids. Got some better but cough never went away. Still smoking 1/2 PPD (takes O2 off -goes outside to smoke) .  Remains on Dulera and Spiriva  With no missed doses.  OTC not helping.  Wants referral for home health.  Encouraged to quit smoking.   ROV 02/18/12 -- COPD, OSA on CPAP, chronic cough. Had an AE with residual cough as described above 2/13. Remains on Spiriva + Dulera. Remains on omeprazole bid, flonase, not using loratadine right now. Still smoking about 3 cig a day. She is interested in trying the nicotine patches  ROV 07/08/12 -- COPD, OSA on CPAP, chronic cough. She reports that she quit smoking July 3! She has gained 60 lbs over the last year. Her breathing is stable, still limited. She is wearing her O2 and her CPAP. Continues to take Spiriva + Dulera. She reports that she is still having GERD sx even on omeprazole bid. She ran out of flonase 1 month ago. She has had some epistaxis on R.     Objective:   Physical Exam Filed Vitals:   07/08/12 1630  BP: 124/72  Pulse: 95  Temp: 98.7 F (37.1 C)   Gen: Pleasant, obese, in no distress,  normal affect  ENT: No lesions,  mouth  clear,  oropharynx clear, no postnasal drip  Neck: No JVD, no TMG, no carotid bruits  Lungs: No use of accessory muscles, distant, coarse BS , no wheezes   Cardiovascular: RRR, heart sounds normal, no murmur or gallops, no peripheral edema  Musculoskeletal: No deformities, no cyanosis or clubbing  Neuro: alert, non focal  Skin: Warm, no lesions or rashes, old scars on B UE's   Assessment & Plan:  Obstructive sleep apnea Continue CPAP  GERD (gastroesophageal reflux disease) Continue omeprazole, need to work on wt loss  COPD (chronic obstructive pulmonary disease) Continue same BDs  Chronic allergic rhinitis Restart flonase  Epistaxis, recurrent Has required cautery in the past (as a child) - referral to ENT for eval

## 2012-07-08 NOTE — Assessment & Plan Note (Signed)
Restart flonase 

## 2012-07-08 NOTE — Assessment & Plan Note (Signed)
Continue CPAP.  

## 2012-07-08 NOTE — Assessment & Plan Note (Signed)
Continue omeprazole, need to work on wt loss

## 2012-07-08 NOTE — Assessment & Plan Note (Signed)
Continue same BD's  

## 2012-07-08 NOTE — Assessment & Plan Note (Signed)
Has required cautery in the past (as a child) - referral to ENT for eval

## 2012-07-10 ENCOUNTER — Other Ambulatory Visit: Payer: Self-pay | Admitting: Emergency Medicine

## 2012-07-17 ENCOUNTER — Emergency Department (HOSPITAL_COMMUNITY): Payer: Medicaid Other

## 2012-07-17 ENCOUNTER — Emergency Department (HOSPITAL_COMMUNITY)
Admission: EM | Admit: 2012-07-17 | Discharge: 2012-07-17 | Disposition: A | Discharge status: Home / Self Care | Payer: Medicaid Other | Attending: Emergency Medicine | Admitting: Emergency Medicine

## 2012-07-17 ENCOUNTER — Encounter (HOSPITAL_COMMUNITY): Payer: Self-pay | Admitting: *Deleted

## 2012-07-17 DIAGNOSIS — J4489 Other specified chronic obstructive pulmonary disease: Secondary | ICD-10-CM | POA: Insufficient documentation

## 2012-07-17 DIAGNOSIS — F411 Generalized anxiety disorder: Secondary | ICD-10-CM | POA: Insufficient documentation

## 2012-07-17 DIAGNOSIS — Z9981 Dependence on supplemental oxygen: Secondary | ICD-10-CM | POA: Insufficient documentation

## 2012-07-17 DIAGNOSIS — J029 Acute pharyngitis, unspecified: Secondary | ICD-10-CM | POA: Insufficient documentation

## 2012-07-17 DIAGNOSIS — I10 Essential (primary) hypertension: Secondary | ICD-10-CM | POA: Insufficient documentation

## 2012-07-17 DIAGNOSIS — R0602 Shortness of breath: Secondary | ICD-10-CM | POA: Insufficient documentation

## 2012-07-17 DIAGNOSIS — Z79899 Other long term (current) drug therapy: Secondary | ICD-10-CM | POA: Insufficient documentation

## 2012-07-17 DIAGNOSIS — J449 Chronic obstructive pulmonary disease, unspecified: Secondary | ICD-10-CM

## 2012-07-17 DIAGNOSIS — J40 Bronchitis, not specified as acute or chronic: Secondary | ICD-10-CM

## 2012-07-17 HISTORY — DX: Anxiety disorder, unspecified: F41.9

## 2012-07-17 MED ORDER — ALBUTEROL SULFATE (5 MG/ML) 0.5% IN NEBU
5.0000 mg | INHALATION_SOLUTION | Freq: Once | RESPIRATORY_TRACT | Status: AC
  Administered 2012-07-17: 5 mg via RESPIRATORY_TRACT
  Filled 2012-07-17: qty 1

## 2012-07-17 MED ORDER — IPRATROPIUM BROMIDE 0.02 % IN SOLN
0.5000 mg | Freq: Once | RESPIRATORY_TRACT | Status: AC
  Administered 2012-07-17: 0.5 mg via RESPIRATORY_TRACT
  Filled 2012-07-17: qty 2.5

## 2012-07-17 MED ORDER — AZITHROMYCIN 250 MG PO TABS
500.0000 mg | ORAL_TABLET | Freq: Once | ORAL | Status: AC
  Administered 2012-07-17: 500 mg via ORAL
  Filled 2012-07-17: qty 2

## 2012-07-17 MED ORDER — PREDNISONE 20 MG PO TABS
60.0000 mg | ORAL_TABLET | Freq: Once | ORAL | Status: AC
  Administered 2012-07-17: 60 mg via ORAL
  Filled 2012-07-17: qty 3

## 2012-07-17 MED ORDER — ALBUTEROL SULFATE HFA 108 (90 BASE) MCG/ACT IN AERS: 2.0000 | INHALATION_SPRAY | RESPIRATORY_TRACT | Status: DC | PRN

## 2012-07-17 MED ORDER — ALPRAZOLAM 0.25 MG PO TABS
0.2500 mg | ORAL_TABLET | Freq: Once | ORAL | Status: AC
  Administered 2012-07-17: 0.25 mg via ORAL
  Filled 2012-07-17: qty 1

## 2012-07-17 MED ORDER — PREDNISONE 20 MG PO TABS: ORAL_TABLET | ORAL | Status: AC

## 2012-07-17 MED ORDER — AZITHROMYCIN 250 MG PO TABS: ORAL_TABLET | ORAL | Status: AC

## 2012-07-17 NOTE — ED Notes (Signed)
MD at bedside. 

## 2012-07-17 NOTE — ED Notes (Signed)
RT at bedside.

## 2012-07-17 NOTE — ED Notes (Signed)
Patient transported to X-ray 

## 2012-07-17 NOTE — ED Notes (Signed)
RUE:[id number]<JW> Expected date:<BR> Expected time: 8:49 AM<BR> Means of arrival:Ambulance<BR> Comments:<BR> SOB/ Hx COPD, stable

## 2012-07-17 NOTE — ED Notes (Signed)
Per [facility] pt from home with reports of shortness of breath but endorses hx of COPD for about 3 years. EMS reports pt also endorses anxiety and lives alone. EMS reports pt wears home O2 at 2L which she was wearing upon their arrival, O2 was increased to 4L and after speaking with pt and helping her calm down, symptoms improved.

## 2012-07-17 NOTE — ED Provider Notes (Signed)
History     CSN: [id number]  Arrival date & time 07/17/12  6295   First MD Initiated Contact with Patient 07/17/12 [phone]      Chief Complaint  Patient presents with  . Shortness of Breath  . Anxiety    (Consider location/radiation/quality/duration/timing/severity/associated sxs/prior treatment) Patient is a 56 y.o. female presenting with shortness of breath and anxiety. The history is provided by the patient.  Shortness of Breath  Associated symptoms include cough, shortness of breath and wheezing. Pertinent negatives include no chest pain and no fever.  Anxiety Associated symptoms include shortness of breath. Pertinent negatives include no chest pain and no abdominal pain.  pt w hx copd, presents w increased wheezing, and worsening non productive cough in past week. Denies sore throat or runny nose. No fever or chills. No chest pain or discomfort. States on home o2 continuously. Uses neb txs daily/prn, however has not used yet today. States otherwise compliant w meds, denies recent change in meds. No leg pain or swelling. No dvt or pe hx. Denies orthopnea or pnd.      Past Medical History  Diagnosis Date  . Emphysema   . COPD (chronic obstructive pulmonary disease)   . OSA on CPAP   . Borderline diabetes   . Hypertension   . Anxiety     Past Surgical History  Procedure Date  . Tubal ligation   . Cardiac catheterization     Family History  Problem Relation Age of Onset  . Lung cancer Paternal Aunt   . Lung cancer Paternal Grandfather     History  Substance Use Topics  . Smoking status: Former Smoker -- 1.0 packs/day for 36 years    Types: Cigarettes    Quit date: 05/14/2012  . Smokeless tobacco: Never Used   Comment: Approx 90 pk-yrs  . Alcohol Use: No    OB History    Grav Para Term Preterm Abortions TAB SAB Ect Mult Living                  Review of Systems  Constitutional: Negative for fever and chills.  HENT: Negative for neck pain and neck  stiffness.   Eyes: Negative for redness.  Respiratory: Positive for cough, shortness of breath and wheezing.   Cardiovascular: Negative for chest pain and leg swelling.  Gastrointestinal: Negative for abdominal pain.  Genitourinary: Negative for flank pain.  Musculoskeletal: Negative for back pain.  Skin: Negative for rash.  Neurological: Negative for light-headedness.  Hematological: Does not bruise/bleed easily.  Psychiatric/Behavioral: The patient is nervous/anxious.     Allergies  Review of patient's allergies indicates no known allergies.  Home Medications   Current Outpatient Rx  Name Route Sig Dispense Refill  . ACETAMINOPHEN 500 MG PO TABS Oral Take 500 mg by mouth every 6 (six) hours as needed.    . ALBUTEROL SULFATE HFA 108 (90 BASE) MCG/ACT IN AERS Inhalation Inhale 2 puffs into the lungs every 4 (four) hours as needed for shortness of breath. 1 Inhaler 11  . ALBUTEROL SULFATE (2.5 MG/3ML) 0.083% IN NEBU Nebulization Take 2.5 mg by nebulization 4 (four) times daily as needed.     Marland Kitchen AMITRIPTYLINE HCL 50 MG PO TABS Oral Take 100 mg by mouth at bedtime.    . ASPIRIN EC 81 MG PO TBEC Oral Take 81 mg by mouth daily.    Marland Kitchen VITAMIN B-12 500 MCG PO LOZG Oral Take 500 mcg by mouth daily.    . DULERA 200-5 MCG/ACT IN  AERO  inhale 2 puffs twice a day 13 g 0  . FLUTICASONE PROPIONATE 50 MCG/ACT NA SUSP Nasal Place 2 sprays into the nose 2 (two) times daily. 16 g 11  . LORATADINE 10 MG PO TABS Oral Take 1 tablet (10 mg total) by mouth daily. 30 tablet 5  . OMEPRAZOLE 20 MG PO CPDR  take 1 capsule by mouth twice a day 60 capsule 0  . SPIRIVA HANDIHALER 18 MCG IN CAPS  inhale contents of 1 capsule by mouth once daily 30 capsule 5  . TRAMADOL HCL 50 MG PO TABS Oral Take 50 mg by mouth every 6 (six) hours as needed. pain    . TRIAMTERENE-HCTZ 37.5-25 MG PO CAPS  take 1 capsule by mouth once daily 30 capsule 3    BP 137/64  Pulse 89  Temp 98 F (36.7 C) (Oral)  Resp 22  SpO2  97%  Physical Exam  Nursing note and vitals reviewed. Constitutional: She is oriented to person, place, and time. She appears well-developed and well-nourished. No distress.  HENT:  Nose: Nose normal.  Mouth/Throat: Oropharynx is clear and moist.  Eyes: Conjunctivae are normal. No scleral icterus.  Neck: Neck supple. No tracheal deviation present.  Cardiovascular: Normal rate, regular rhythm, normal heart sounds and intact distal pulses.   Pulmonary/Chest: Effort normal. No respiratory distress. She has wheezes.       Diminished/difficut to hear breath sounds bil. Wheezing bil.   Abdominal: Soft. Normal appearance. She exhibits no distension. There is no tenderness.  Musculoskeletal: She exhibits no edema and no tenderness.  Neurological: She is alert and oriented to person, place, and time.  Skin: Skin is warm and dry. No rash noted.  Psychiatric:       Mildly anxious.     ED Course  Procedures (including critical care time)   Dg Chest 2 View  07/17/2012  *RADIOLOGY REPORT*  Clinical Data: Shortness of breath and cough  CHEST - 2 VIEW  Comparison: 08/24/2012  Findings: Heart size appears normal.  There is no pleural effusion or edema identified.  No airspace consolidation identified.  There is coarsened interstitial markings  IMPRESSION: No active cardiopulmonary abnormalities.   Original Report Authenticated By: Rosealee Albee, M.D.        MDM  Albuterol and atrovent neb.   Cxr.  Reviewed nursing notes and prior charts for additional history.   Pt notes hx anxiety, states out of med for same. Xanax po.   Mild wheezing persists.  pred po. Additional albuterol and atrovent neb.  Given copd, worsening cough, will rx bronchitis. No pna on cxr.   Recheck pt eating/drinking. Nad.           Suzi Roots, MD 07/17/12 [phone]

## 2012-07-17 NOTE — ED Notes (Signed)
Pt reports that she feels that breathing has worsened since she began taking Ultram on Friday which is a new prescription.

## 2012-07-24 ENCOUNTER — Telehealth: Payer: Self-pay | Admitting: Emergency Medicine

## 2012-07-24 NOTE — Telephone Encounter (Signed)
I spoke with pt and she stated she has a new PCP Dr. Nanine Means. He will not respond to refilling amitriptyline since they did not originally prescribe this for pt. This was giving to her from her doctor in [place]. She is wanting to know if Dr. Delton Coombes will refill this for her. She is completely out. Please advise Dr. Delton Coombes thank you

## 2012-07-25 MED ORDER — AMITRIPTYLINE HCL 50 MG PO TABS: 100.0000 mg | ORAL_TABLET | Freq: Every day | ORAL | Status: DC

## 2012-07-25 NOTE — Telephone Encounter (Signed)
I didn't prescribe this medication. We can give her 1 month supply, no RF and then she needs to discuss with PCP, arrange to get from him.

## 2012-07-25 NOTE — Telephone Encounter (Signed)
I spoke with pt and she verbalized her understanding of this. rx has been sent

## 2012-07-25 NOTE — Telephone Encounter (Signed)
Pt is very mad that she did not get this medication filled yesterday & would like to ensure that this gets filled today.  Pt states she is only getting one hr of sleep at night for the past two weeks & wants this taken care of.  Advised pt that RB would be in the office this afternoon.  Antionette Fairy

## 2012-08-11 ENCOUNTER — Other Ambulatory Visit: Payer: Self-pay | Admitting: Emergency Medicine

## 2012-08-18 ENCOUNTER — Other Ambulatory Visit: Payer: Self-pay | Admitting: Gastroenterology

## 2012-08-25 ENCOUNTER — Other Ambulatory Visit: Payer: Self-pay | Admitting: Emergency Medicine

## 2012-09-05 ENCOUNTER — Encounter (HOSPITAL_COMMUNITY): Payer: Self-pay | Admitting: *Deleted

## 2012-09-05 ENCOUNTER — Ambulatory Visit (HOSPITAL_COMMUNITY)
Admission: RE | Admit: 2012-09-05 | Discharge: 2012-09-05 | Disposition: A | Discharge status: Home / Self Care | Payer: Medicaid Other | Source: Ambulatory Visit | Attending: Gastroenterology | Admitting: Gastroenterology

## 2012-09-05 ENCOUNTER — Encounter (HOSPITAL_COMMUNITY)
Admission: RE | Disposition: A | Discharge status: Home / Self Care | Payer: Self-pay | Source: Ambulatory Visit | Attending: Gastroenterology

## 2012-09-05 DIAGNOSIS — K648 Other hemorrhoids: Secondary | ICD-10-CM | POA: Insufficient documentation

## 2012-09-05 DIAGNOSIS — I10 Essential (primary) hypertension: Secondary | ICD-10-CM | POA: Insufficient documentation

## 2012-09-05 DIAGNOSIS — K644 Residual hemorrhoidal skin tags: Secondary | ICD-10-CM | POA: Insufficient documentation

## 2012-09-05 DIAGNOSIS — D126 Benign neoplasm of colon, unspecified: Secondary | ICD-10-CM | POA: Insufficient documentation

## 2012-09-05 DIAGNOSIS — K219 Gastro-esophageal reflux disease without esophagitis: Secondary | ICD-10-CM | POA: Insufficient documentation

## 2012-09-05 DIAGNOSIS — Z1211 Encounter for screening for malignant neoplasm of colon: Secondary | ICD-10-CM | POA: Insufficient documentation

## 2012-09-05 DIAGNOSIS — J438 Other emphysema: Secondary | ICD-10-CM | POA: Insufficient documentation

## 2012-09-05 HISTORY — PX: COLONOSCOPY: SHX5424

## 2012-09-05 HISTORY — DX: Gastro-esophageal reflux disease without esophagitis: K21.9

## 2012-09-05 HISTORY — DX: Anemia, unspecified: D64.9

## 2012-09-05 SURGERY — COLONOSCOPY
Anesthesia: Moderate Sedation

## 2012-09-05 MED ORDER — ALBUTEROL SULFATE (5 MG/ML) 0.5% IN NEBU
INHALATION_SOLUTION | RESPIRATORY_TRACT | Status: AC
  Filled 2012-09-05: qty 0.5

## 2012-09-05 MED ORDER — ALBUTEROL SULFATE (5 MG/ML) 0.5% IN NEBU: 2.5000 mg | INHALATION_SOLUTION | Freq: Once | RESPIRATORY_TRACT | Status: DC

## 2012-09-05 MED ORDER — MIDAZOLAM HCL 10 MG/2ML IJ SOLN
INTRAMUSCULAR | Status: AC
  Filled 2012-09-05: qty 4

## 2012-09-05 MED ORDER — SODIUM CHLORIDE 0.9 % IV SOLN: INTRAVENOUS | Status: DC

## 2012-09-05 MED ORDER — FENTANYL CITRATE 0.05 MG/ML IJ SOLN
INTRAMUSCULAR | Status: DC | PRN
  Administered 2012-09-05 (×3): 25 ug via INTRAVENOUS
  Administered 2012-09-05: 12.5 ug via INTRAVENOUS

## 2012-09-05 MED ORDER — DIPHENHYDRAMINE HCL 50 MG/ML IJ SOLN
INTRAMUSCULAR | Status: AC
  Filled 2012-09-05: qty 1

## 2012-09-05 MED ORDER — FENTANYL CITRATE 0.05 MG/ML IJ SOLN
INTRAMUSCULAR | Status: AC
  Filled 2012-09-05: qty 4

## 2012-09-05 MED ORDER — MIDAZOLAM HCL 10 MG/2ML IJ SOLN
INTRAMUSCULAR | Status: DC | PRN
  Administered 2012-09-05: 2 mg via INTRAVENOUS
  Administered 2012-09-05 (×2): 1 mg via INTRAVENOUS
  Administered 2012-09-05: 2 mg via INTRAVENOUS

## 2012-09-05 NOTE — Op Note (Signed)
[facility] [address] [place], [address]   OPERATIVE PROCEDURE REPORT  PATIENT: Stephanie Schultz, Stephanie Schultz  MR#: [id number] BIRTHDATE: August 01, 1956  GENDER: Female ENDOSCOPIST: Jeani Hawking, MD ASSISTANT:   Angelique Blonder, RN Dorisann Frames, technician PROCEDURE DATE: 09/05/2012 PROCEDURE:   Colonoscopy with snare polypectomy ASA CLASS:   Class III INDICATIONS:average risk patient for colon cancer. MEDICATIONS: Versed 6 mg IV and Fentanyl 87.5 mcg IV  DESCRIPTION OF PROCEDURE:   After the risks benefits and alternatives of the procedure were thoroughly explained, informed consent was obtained.        The Pentax Colonoscope [id number] endoscope was introduced through the anus  and advanced to the    , No adverse events experienced.    The quality of the prep was good. .  The instrument was then slowly withdrawn as the colon was fully examined.     COLON FINDINGS: The patient had an excellent prep except for the cecum.  In the cecum vegetable matter was encountered and it was difficult to suction, but with rotation of the patient and flooding the cecum good views of the mucosal were obtained.  A 2 mm sessile cecal polyp was removed with a cold snare.  A large 1.5 cm pedunculated descending colon polyp was removed with a cold snare. A 3 mm sessile descending colon polyp was removed with a cold snare.  No other abnormalities identified.     Retroflexed views revealed internal/external hemorrhoids.     Technical difficulties precluded the capture of some images.  The scope was then withdrawn from the patient and the procedure terminated.  COMPLICATIONS: There were no complications.  IMPRESSION 1) Polyps. 2) Hemorrhoids.  RECOMMENDATIONS: 1.  Await biopsy results 2.  2) Repeat the colonoscopy in 3 years.   _______________________________ eSignedJeani Hawking, MD 09/05/2012 10:38 AM

## 2012-09-05 NOTE — H&P (Signed)
  Reason for Consult: Screening colonoscopy Referring Physician: Lonia Blood, M.D.  Stephanie Schultz HPI: This is a 56 year old female with severe COPD referred for a screening colonoscopy.  The patient denies any issues with hematochezia, melena, abdominal pain, nausea, vomiting, or any family history of colon cancer.  Past Medical History  Diagnosis Date  . Borderline diabetes   . Hypertension   . Anxiety   . Emphysema   . COPD (chronic obstructive pulmonary disease)   . OSA on CPAP     CPAP at night   . Dizziness     pt believes this is motion sickness or vertigo  . GERD (gastroesophageal reflux disease)   . Headache   . Cancer 1990    cervical   . Anemia     Past Surgical History  Procedure Date  . Tubal ligation   . Cardiac catheterization     Family History  Problem Relation Age of Onset  . Lung cancer Paternal Aunt   . Lung cancer Paternal Grandfather     Social History:  reports that she has been smoking Cigarettes.  She has a 36 pack-year smoking history. She has never used smokeless tobacco. She reports that she does not drink alcohol or use illicit drugs.  Allergies: No Known Allergies  Medications:  Scheduled:   . albuterol  2.5 mg Nebulization Once   Continuous:   . sodium chloride      Results for orders placed during the hospital encounter of 09/05/12 (from the past 24 hour(s))  GLUCOSE, CAPILLARY     Status: Normal   Collection Time   09/05/12  8:48 AM      Component Value Range   Glucose-Capillary 99  70 - 99 mg/dL     No results found.  ROS:  As stated above in the HPI otherwise negative.  Blood pressure 124/71, temperature 97.9 F (36.6 C), temperature source Oral, resp. rate 33, height 5' (1.524 m), weight 100.699 kg (222 lb), SpO2 93.00%.    PE: Gen: NAD, Alert and Oriented HEENT:  [place]/AT, EOMI Neck: Supple, no LAD Lungs: Some end-expiratory wheezing CV: RRR without M/G/R ABM: Soft, NTND, +BS Ext: No C/C/E  Assessment/Plan: 1)  Screening colonoscopy. 2) COPD.  Plan: 1) Colonoscopy. 2) Pre procedure albuterol.  Stephanie Schultz D 09/05/2012, 9:50 AM

## 2012-09-08 ENCOUNTER — Encounter (HOSPITAL_COMMUNITY): Payer: Self-pay | Admitting: Gastroenterology

## 2012-09-16 ENCOUNTER — Other Ambulatory Visit: Payer: Self-pay | Admitting: Internal Medicine

## 2012-09-16 DIAGNOSIS — N63 Unspecified lump in unspecified breast: Secondary | ICD-10-CM

## 2012-10-16 ENCOUNTER — Emergency Department (HOSPITAL_COMMUNITY): Payer: Medicaid Other

## 2012-10-16 ENCOUNTER — Emergency Department (HOSPITAL_COMMUNITY)
Admission: EM | Admit: 2012-10-16 | Discharge: 2012-10-16 | Disposition: A | Discharge status: Home / Self Care | Payer: Medicaid Other | Attending: Emergency Medicine | Admitting: Emergency Medicine

## 2012-10-16 ENCOUNTER — Other Ambulatory Visit: Payer: Medicaid Other

## 2012-10-16 ENCOUNTER — Encounter (HOSPITAL_COMMUNITY): Payer: Self-pay | Admitting: Emergency Medicine

## 2012-10-16 DIAGNOSIS — F17200 Nicotine dependence, unspecified, uncomplicated: Secondary | ICD-10-CM | POA: Insufficient documentation

## 2012-10-16 DIAGNOSIS — L0231 Cutaneous abscess of buttock: Secondary | ICD-10-CM | POA: Insufficient documentation

## 2012-10-16 DIAGNOSIS — Z7982 Long term (current) use of aspirin: Secondary | ICD-10-CM | POA: Insufficient documentation

## 2012-10-16 DIAGNOSIS — Z79899 Other long term (current) drug therapy: Secondary | ICD-10-CM | POA: Insufficient documentation

## 2012-10-16 DIAGNOSIS — R0789 Other chest pain: Secondary | ICD-10-CM

## 2012-10-16 DIAGNOSIS — R1013 Epigastric pain: Secondary | ICD-10-CM | POA: Insufficient documentation

## 2012-10-16 DIAGNOSIS — Z8719 Personal history of other diseases of the digestive system: Secondary | ICD-10-CM | POA: Insufficient documentation

## 2012-10-16 DIAGNOSIS — R0609 Other forms of dyspnea: Secondary | ICD-10-CM | POA: Insufficient documentation

## 2012-10-16 DIAGNOSIS — Z9981 Dependence on supplemental oxygen: Secondary | ICD-10-CM | POA: Insufficient documentation

## 2012-10-16 DIAGNOSIS — Z862 Personal history of diseases of the blood and blood-forming organs and certain disorders involving the immune mechanism: Secondary | ICD-10-CM | POA: Insufficient documentation

## 2012-10-16 DIAGNOSIS — F411 Generalized anxiety disorder: Secondary | ICD-10-CM | POA: Insufficient documentation

## 2012-10-16 DIAGNOSIS — Z9989 Dependence on other enabling machines and devices: Secondary | ICD-10-CM | POA: Insufficient documentation

## 2012-10-16 DIAGNOSIS — R0989 Other specified symptoms and signs involving the circulatory and respiratory systems: Secondary | ICD-10-CM | POA: Insufficient documentation

## 2012-10-16 DIAGNOSIS — J4489 Other specified chronic obstructive pulmonary disease: Secondary | ICD-10-CM | POA: Insufficient documentation

## 2012-10-16 DIAGNOSIS — L0390 Cellulitis, unspecified: Secondary | ICD-10-CM

## 2012-10-16 DIAGNOSIS — I10 Essential (primary) hypertension: Secondary | ICD-10-CM | POA: Insufficient documentation

## 2012-10-16 DIAGNOSIS — J449 Chronic obstructive pulmonary disease, unspecified: Secondary | ICD-10-CM | POA: Insufficient documentation

## 2012-10-16 DIAGNOSIS — Z8541 Personal history of malignant neoplasm of cervix uteri: Secondary | ICD-10-CM | POA: Insufficient documentation

## 2012-10-16 DIAGNOSIS — J438 Other emphysema: Secondary | ICD-10-CM | POA: Insufficient documentation

## 2012-10-16 LAB — CBC WITH DIFFERENTIAL/PLATELET
Basophils Absolute: 0 10*3/uL (ref 0.0–0.1)
Basophils Relative: 0 % (ref 0–1)
Eosinophils Absolute: 0.2 10*3/uL (ref 0.0–0.7)
Eosinophils Relative: 3 % (ref 0–5)
HCT: 38.7 % (ref 36.0–46.0)
Hemoglobin: 13.8 g/dL (ref 12.0–15.0)
Lymphocytes Relative: 39 % (ref 12–46)
Lymphs Abs: 2.3 10*3/uL (ref 0.7–4.0)
MCH: 29.4 pg (ref 26.0–34.0)
MCHC: 35.7 g/dL (ref 30.0–36.0)
MCV: 82.3 fL (ref 78.0–100.0)
Monocytes Absolute: 0.3 10*3/uL (ref 0.1–1.0)
Monocytes Relative: 6 % (ref 3–12)
Neutro Abs: 3 10*3/uL (ref 1.7–7.7)
Neutrophils Relative %: 52 % (ref 43–77)
Platelets: 229 10*3/uL (ref 150–400)
RBC: 4.7 MIL/uL (ref 3.87–5.11)
RDW: 14.4 % (ref 11.5–15.5)
WBC: 5.8 10*3/uL (ref 4.0–10.5)

## 2012-10-16 LAB — COMPREHENSIVE METABOLIC PANEL
ALT: 48 U/L — ABNORMAL HIGH (ref 0–35)
AST: 32 U/L (ref 0–37)
Albumin: 3.7 g/dL (ref 3.5–5.2)
Alkaline Phosphatase: 53 U/L (ref 39–117)
BUN: 8 mg/dL (ref 6–23)
CO2: 27 mEq/L (ref 19–32)
Calcium: 9.4 mg/dL (ref 8.4–10.5)
Chloride: 101 mEq/L (ref 96–112)
Creatinine, Ser: 0.89 mg/dL (ref 0.50–1.10)
GFR calc Af Amer: 84 mL/min — ABNORMAL LOW (ref >90)
GFR calc non Af Amer: 72 mL/min — ABNORMAL LOW (ref >90)
Glucose, Bld: 124 mg/dL — ABNORMAL HIGH (ref 70–99)
Potassium: 3 mEq/L — ABNORMAL LOW (ref 3.5–5.1)
Sodium: 140 mEq/L (ref 135–145)
Total Bilirubin: 0.2 mg/dL — ABNORMAL LOW (ref 0.3–1.2)
Total Protein: 7.7 g/dL (ref 6.0–8.3)

## 2012-10-16 LAB — URINALYSIS, ROUTINE W REFLEX MICROSCOPIC
Bilirubin Urine: NEGATIVE
Glucose, UA: NEGATIVE mg/dL
Hgb urine dipstick: NEGATIVE
Ketones, ur: NEGATIVE mg/dL
Leukocytes, UA: NEGATIVE
Nitrite: NEGATIVE
Protein, ur: NEGATIVE mg/dL
Specific Gravity, Urine: 1.021 (ref 1.005–1.030)
Urobilinogen, UA: 0.2 mg/dL (ref 0.0–1.0)
pH: 6.5 (ref 5.0–8.0)

## 2012-10-16 LAB — MAGNESIUM: Magnesium: 2.1 mg/dL (ref 1.5–2.5)

## 2012-10-16 LAB — TROPONIN I: Troponin I: <0.3 ng/mL (ref <0.30)

## 2012-10-16 LAB — POCT I-STAT TROPONIN I

## 2012-10-16 MED ORDER — SULFAMETHOXAZOLE-TRIMETHOPRIM 800-160 MG PO TABS: 2.0000 | ORAL_TABLET | Freq: Two times a day (BID) | ORAL | Status: DC

## 2012-10-16 MED ORDER — POTASSIUM CHLORIDE CRYS ER 20 MEQ PO TBCR
40.0000 meq | EXTENDED_RELEASE_TABLET | Freq: Once | ORAL | Status: AC
  Administered 2012-10-16: 40 meq via ORAL
  Filled 2012-10-16: qty 2

## 2012-10-16 MED ORDER — IPRATROPIUM BROMIDE 0.02 % IN SOLN
0.5000 mg | Freq: Once | RESPIRATORY_TRACT | Status: AC
  Administered 2012-10-16: 0.5 mg via RESPIRATORY_TRACT
  Filled 2012-10-16: qty 2.5

## 2012-10-16 MED ORDER — ALBUTEROL SULFATE (5 MG/ML) 0.5% IN NEBU
5.0000 mg | INHALATION_SOLUTION | Freq: Once | RESPIRATORY_TRACT | Status: AC
  Administered 2012-10-16: 5 mg via RESPIRATORY_TRACT
  Filled 2012-10-16: qty 1

## 2012-10-16 NOTE — ED Provider Notes (Signed)
History     CSN: [id number]  Arrival date & time 10/16/12  0918   First MD Initiated Contact with Patient 10/16/12 1104      Chief Complaint  Patient presents with  . Recurrent Skin Infections    (Consider location/radiation/quality/duration/timing/severity/associated sxs/prior treatment) HPI The patient presents to the ED with chief complaint of dizziness for 9 days.  SHe reports near syncope event 9 days ago.  She has tried Meclizine for her dizziness without relief.  She also complains of increase in dyspnea requiring more home oxygen. She denies LE swelling. She reports central and Left sided chest discomfort during encounter.  The discomfort is described as burning and lasts for seconds.  She reports only eating a piece of candy today.    She also complains of a "spider bite" on her R buttox. She denies pain but reports purities in the area.  She has tried to express the area and has reported putting alcohol on the area.   Past Medical History  Diagnosis Date  . Borderline diabetes   . Hypertension   . Anxiety   . Emphysema   . COPD (chronic obstructive pulmonary disease)   . OSA on CPAP     CPAP at night   . Dizziness     pt believes this is motion sickness or vertigo  . GERD (gastroesophageal reflux disease)   . Headache   . Cancer 1990    cervical   . Anemia     Past Surgical History  Procedure Date  . Tubal ligation   . Cardiac catheterization   . Colonoscopy 09/05/2012    Procedure: COLONOSCOPY;  Surgeon: Theda Belfast, MD;  Location: [facility];  Service: Endoscopy;  Laterality: N/A;    Family History  Problem Relation Age of Onset  . Lung cancer Paternal Aunt   . Lung cancer Paternal Grandfather     History  Substance Use Topics  . Smoking status: Current Every Day Smoker -- 1.0 packs/day for 36 years    Types: Cigarettes    Last Attempt to Quit: 05/14/2012  . Smokeless tobacco: Never Used     Comment: Approx 90 pk-yrs  . Alcohol Use: No     OB History    Grav Para Term Preterm Abortions TAB SAB Ect Mult Living                  Review of Systems All other systems negative except as documented in the HPI. All pertinent positives and negatives as reviewed in the HPI.  Allergies  Review of patient's allergies indicates no known allergies.  Home Medications   Current Outpatient Rx  Name  Route  Sig  Dispense  Refill  . ALBUTEROL SULFATE HFA 108 (90 BASE) MCG/ACT IN AERS   Inhalation   Inhale 2 puffs into the lungs every 4 (four) hours as needed for wheezing.   1 Inhaler   3   . ALBUTEROL SULFATE (2.5 MG/3ML) 0.083% IN NEBU   Nebulization   Take 2.5 mg by nebulization 4 (four) times daily as needed.          Marland Kitchen AMITRIPTYLINE HCL 50 MG PO TABS   Oral   Take 2 tablets (100 mg total) by mouth at bedtime. Further refills will need to come from her PCP   60 tablet   0   . ASPIRIN EC 81 MG PO TBEC   Oral   Take 81 mg by mouth daily.         Marland Kitchen  VITAMIN B-12 500 MCG PO LOZG   Oral   Take 500 mcg by mouth daily.         . DULERA 200-5 MCG/ACT IN AERO      inhale 2 puffs twice a day   1 Inhaler   3   . FLUTICASONE PROPIONATE 50 MCG/ACT NA SUSP   Nasal   Place 2 sprays into the nose 2 (two) times daily.   16 g   11   . LORATADINE 10 MG PO TABS   Oral   Take 1 tablet (10 mg total) by mouth daily.   30 tablet   5   . OMEPRAZOLE 20 MG PO CPDR      take 1 capsule by mouth twice a day   60 capsule   3   . SPIRIVA HANDIHALER 18 MCG IN CAPS      inhale contents of 1 capsule by mouth once daily   30 capsule   5   . TRAMADOL HCL 50 MG PO TABS   Oral   Take 50 mg by mouth every 6 (six) hours as needed. pain         . TRIAMTERENE-HCTZ 37.5-25 MG PO CAPS      take 1 capsule by mouth once daily   30 capsule   3     BP 139/69  Pulse 94  Temp 98.1 F (36.7 C) (Oral)  Resp 18  SpO2 94%  Physical Exam  Constitutional: She appears well-developed and well-nourished.  Eyes: EOM are  normal. Right eye exhibits no nystagmus.  Cardiovascular: Normal rate, regular rhythm, S1 normal and S2 normal.        Distant hear sounds  Pulmonary/Chest: Effort normal. Not tachypneic. She has wheezes.  Abdominal: Soft. Bowel sounds are normal. There is tenderness in the epigastric area. There is no rebound and no guarding.  Neurological: She is alert.    ED Course  Procedures (including critical care time)  Labs Reviewed  COMPREHENSIVE METABOLIC PANEL - Abnormal; Notable for the following:    Potassium 3.0 (*)     Glucose, Bld 124 (*)     ALT 48 (*)     Total Bilirubin 0.2 (*)     GFR calc non Af Amer 72 (*)     GFR calc Af Amer 84 (*)     All other components within normal limits  URINALYSIS, ROUTINE W REFLEX MICROSCOPIC - Abnormal; Notable for the following:    APPearance CLOUDY (*)     All other components within normal limits  CBC WITH DIFFERENTIAL  TROPONIN I   Dg Chest 2 View  10/16/2012  *RADIOLOGY REPORT*  Clinical Data: Chest pain, shortness of breath  CHEST - 2 VIEW  Comparison: Chest x-ray of 07/17/2012  Findings: No active infiltrate or effusion is seen.  Cardiomegaly is stable.  There are diffuse degenerative changes throughout the thoracic spine.  IMPRESSION: Stable cardiomegaly.  No active lung disease.   Original Report Authenticated By: Dwyane Dee, M.D.      No diagnosis found.  1315 Patient reports chest discomfort is intermittent, lasting up to 1 minute and is requesting food. 1450 Upon further questioning she describes the dizziness as lightheadedness that occurs after ambulation from a seated position, which has increased in frequency since Sunday. Pulm/Chest clear to ascultation bilaterally, no wheezing.   The patients pain is atypical for cardiac chest pain. The patient has pain for a few seconds at a time. The patient was witnessed to have  this episode by me and she states that it was a burning pain. MDM  MDM Reviewed: nursing note, vitals and  previous chart Interpretation: labs, ECG and x-ray    Date: 10/16/2012  Rate: 76  Rhythm: normal sinus rhythm  QRS Axis: normal  Intervals: normal  ST/T Wave abnormalities: nonspecific T wave changes  Conduction Disutrbances:none  Narrative Interpretation:   Old EKG Reviewed: unchanged          Carlyle Dolly, PA-C 10/16/12 1611

## 2012-10-16 NOTE — ED Notes (Signed)
On Sunday noticed a bump to rt buttocks area and has had small bumps there before and states this is bigger than those. Has itching and not feeling well. States that is has caused her to feel dizzy at times.

## 2012-10-16 NOTE — ED Notes (Signed)
RT paged for breathing tx

## 2012-10-21 NOTE — ED Provider Notes (Signed)
Medical screening examination/treatment/procedure(s) were performed by non-physician practitioner and as supervising physician I was immediately available for consultation/collaboration.  Marwan T Powers, MD 10/21/12 0811 

## 2012-10-23 ENCOUNTER — Other Ambulatory Visit: Payer: Medicaid Other

## 2012-11-12 HISTORY — PX: BREAST BIOPSY: SHX20

## 2012-11-14 ENCOUNTER — Telehealth: Payer: Self-pay | Admitting: Emergency Medicine

## 2012-11-14 ENCOUNTER — Other Ambulatory Visit: Payer: Self-pay | Admitting: Emergency Medicine

## 2012-11-14 DIAGNOSIS — G4733 Obstructive sleep apnea (adult) (pediatric): Secondary | ICD-10-CM

## 2012-11-14 NOTE — Telephone Encounter (Signed)
Duplicate msg.

## 2012-11-14 NOTE — Telephone Encounter (Signed)
Spoke with pt She states due for new machine Order was sent to [facility] for new CPAP  Nothing further needed per pt

## 2012-11-20 ENCOUNTER — Other Ambulatory Visit: Payer: Self-pay | Admitting: Internal Medicine

## 2012-11-20 ENCOUNTER — Ambulatory Visit
Admission: RE | Admit: 2012-11-20 | Discharge: 2012-11-20 | Disposition: A | Discharge status: Home / Self Care | Payer: Medicaid Other | Source: Ambulatory Visit | Attending: Internal Medicine | Admitting: Internal Medicine

## 2012-11-20 DIAGNOSIS — N63 Unspecified lump in unspecified breast: Secondary | ICD-10-CM

## 2012-11-25 ENCOUNTER — Encounter: Payer: Self-pay | Admitting: Emergency Medicine

## 2012-11-25 ENCOUNTER — Ambulatory Visit (INDEPENDENT_AMBULATORY_CARE_PROVIDER_SITE_OTHER): Payer: Medicaid Other | Admitting: Emergency Medicine

## 2012-11-25 VITALS — BP 126/80 | HR 97 | Temp 97.6°F | Ht 60.0 in | Wt 211.8 lb

## 2012-11-25 DIAGNOSIS — G4733 Obstructive sleep apnea (adult) (pediatric): Secondary | ICD-10-CM

## 2012-11-25 DIAGNOSIS — J449 Chronic obstructive pulmonary disease, unspecified: Secondary | ICD-10-CM

## 2012-11-25 MED ORDER — ALBUTEROL SULFATE (2.5 MG/3ML) 0.083% IN NEBU: 2.5000 mg | INHALATION_SOLUTION | Freq: Four times a day (QID) | RESPIRATORY_TRACT | Status: DC | PRN

## 2012-11-25 MED ORDER — MOMETASONE FURO-FORMOTEROL FUM 200-5 MCG/ACT IN AERO: 2.0000 | INHALATION_SPRAY | Freq: Two times a day (BID) | RESPIRATORY_TRACT | Status: DC

## 2012-11-25 MED ORDER — ALBUTEROL SULFATE HFA 108 (90 BASE) MCG/ACT IN AERS: 2.0000 | INHALATION_SPRAY | RESPIRATORY_TRACT | Status: DC | PRN

## 2012-11-25 NOTE — Patient Instructions (Addendum)
Please restart your Dulera 2 puffs twice a day Continue your Spiriva Use your albuterol as needed Wear your oxygen as prescribed We will work on getting your CPAP replaced after the company gets your sleep study from [place]. You may need to have a pressure re-titration in the future to insure you are on the correct settings Follow with Dr Delton Coombes in 3 months or sooner if you have any problems.

## 2012-11-25 NOTE — Assessment & Plan Note (Signed)
More cough, more frequent albuterol use.  - need to restart her Dulera - contineu the spiriva - discussed smoking cessation strategies - rov 3 months

## 2012-11-25 NOTE — Progress Notes (Signed)
Subjective:    Patient ID: Stephanie Schultz, female    DOB: 1956/06/30, 57 y.o.   MRN: [id number]  HPI 57 yo smoker (90 pk-yrs, has cut down to about 2 cig a day), hx HTN, borderline DM, allergies. Dx with COPD at [facility] in [place]. She was started on O2 in ~2009. She had PFT within the last year. She remains fairly active as long as she wears her O2. She can walk about 30-40 feet, has to stop when shopping to rest. She has daily cough, productive of white phlegm. She has rare exacerbations, last was in Jan 2012.   ROV 07/05/11 -- COPD, OSA. Her data from [facility] showed she needs CPAP 10. We ordered O2 thru [facility]. She continues to smoke 3 cigs a day. She has been coughing more, having more sputum - yellow/white sputum.  She is having more dyspnea, more wheezing x 3 weeks. She has gained 30+ lbs since coming to [place]. She has not been wearing her O2 reliably.   ROV 09/20/11 -- COPD, OSA. Returns for f/u. Tells me that she has been rx for COPD exacerbation x 2 since our last visit. She tells me that her CXR showed "a spot on one of her lungs". Was done at [facility] ER on 08/23/11. She was treated with pred and azithro on each occasion. She is on Advair and Spiriva. Continues to have severe cough, costochondritic pain. The pred and azithro helped some but not completely. Ran out loratadine 2 weeks ago. Still on omeprazole bid. She is wearing CPAP 10 reliably.   ROV 10/25/11 -- COPD, OSA on CPAP 10. Last time we started [facility] in place of Advair to see if this helped w UA sx. Still on Spiriva. Restarted loratadine and continued omeprazole bid. She feels that her breathing is the same, cough is a bit better on the [facility]. No flares, but she is still having nasal gtt despite the loratadine.   12/17/2011 Acute OV  Pt presents for an acute office visit, complains of increased SOB, wheezing, prod cough with yellow-to-brown mucus  2 weeks . Went to ED on 1.23.13 and was given zpak and prednisone 50mg  x 4 days. CXR with  chronic changing. Has finished abx and steroids. Got some better but cough never went away. Still smoking 1/2 PPD (takes O2 off -goes outside to smoke) .  Remains on Dulera and Spiriva  With no missed doses.  OTC not helping.  Wants referral for home health.  Encouraged to quit smoking.   ROV 02/18/12 -- COPD, OSA on CPAP, chronic cough. Had an AE with residual cough as described above 2/13. Remains on Spiriva + Dulera. Remains on omeprazole bid, flonase, not using loratadine right now. Still smoking about 3 cig a day. She is interested in trying the nicotine patches  ROV 07/08/12 -- COPD, OSA on CPAP, chronic cough. She reports that she quit smoking July 3! She has gained 60 lbs over the last year. Her breathing is stable, still limited. She is wearing her O2 and her CPAP. Continues to take Spiriva + Dulera. She reports that she is still having GERD sx even on omeprazole bid. She ran out of flonase 1 month ago. She has had some epistaxis on R.   ROV 11/25/12 -- COPD, OSA on CPAP, chronic cough. Since last time she restarted smoking - currently at 5 cig a day. She ran out of her Elwin Sleight a week ago. Still taking spiriva. She has been having more cough over about  2 weeks. She has been using albuterol nebs bid - about to run out of it, need refill. She remains on loratadine, fluticasone, omeprazole. She hasn't been using CPAP regularly - needs new mask, this is being arranged. She is to undergo breast bx for a nodule this week.     Objective:   Physical Exam Filed Vitals:   11/25/12 1409  BP: 126/80  Pulse: 97  Temp: 97.6 F (36.4 C)   Gen: Pleasant, obese, in no distress,  normal affect  ENT: No lesions,  mouth clear,  oropharynx clear, no postnasal drip  Neck: No JVD, no TMG, no carotid bruits  Lungs: No use of accessory muscles, distant, coarse BS , no wheezes   Cardiovascular: RRR, heart sounds normal, no murmur or gallops, no peripheral edema  Musculoskeletal: No deformities, no  cyanosis or clubbing  Neuro: alert, non focal  Skin: Warm, no lesions or rashes, old scars on B UE's   Assessment & Plan:  Obstructive sleep apnea Working on getting a replacement machine and mask. Needed her to authorize release of her sleep study for her DME company - this is being done. Suspect she will need a pressure re-titration since she has gained significant weight since the original test  COPD (chronic obstructive pulmonary disease) More cough, more frequent albuterol use.  - need to restart her Dulera - contineu the spiriva - discussed smoking cessation strategies - rov 3 months

## 2012-11-25 NOTE — Assessment & Plan Note (Signed)
Working on getting a replacement machine and mask. Needed her to authorize release of her sleep study for her DME company - this is being done. Suspect she will need a pressure re-titration since she has gained significant weight since the original test

## 2012-11-25 NOTE — Addendum Note (Signed)
Addended by: Darrell Jewel on: 11/25/2012 05:08 PM   Modules accepted: Orders

## 2012-11-26 ENCOUNTER — Telehealth: Payer: Self-pay | Admitting: Emergency Medicine

## 2012-11-26 MED ORDER — BUDESONIDE-FORMOTEROL FUMARATE 160-4.5 MCG/ACT IN AERO: 2.0000 | INHALATION_SPRAY | Freq: Two times a day (BID) | RESPIRATORY_TRACT | Status: DC

## 2012-11-26 NOTE — Telephone Encounter (Signed)
Let her know we will try symbicort 160 instead, 2 puffs bid

## 2012-11-26 NOTE — Telephone Encounter (Signed)
lmomtcb x1 

## 2012-11-26 NOTE — Telephone Encounter (Signed)
Spoke with patient, she is aware that symbicort 160 2 puffs bid will be called into [employer] per RB. Patient verbalized understanding and nothing further needed at this time.

## 2012-11-26 NOTE — Telephone Encounter (Signed)
Called [facility]. dulera needs PA Phone #: [phone] Medicaid # [id number]   Called to initiate PA on dulera. It was denied. Pt must first try and fail bit advair diskus and advair HFA/symbicort. Pt has only tried advair and spiriva. Please advise RB thanks

## 2012-11-28 ENCOUNTER — Ambulatory Visit
Admission: RE | Admit: 2012-11-28 | Discharge: 2012-11-28 | Disposition: A | Discharge status: Home / Self Care | Payer: Medicaid Other | Source: Ambulatory Visit | Attending: Internal Medicine | Admitting: Internal Medicine

## 2012-11-28 DIAGNOSIS — N63 Unspecified lump in unspecified breast: Secondary | ICD-10-CM

## 2012-12-05 ENCOUNTER — Emergency Department (HOSPITAL_COMMUNITY): Payer: Medicaid Other

## 2012-12-05 ENCOUNTER — Inpatient Hospital Stay (HOSPITAL_COMMUNITY)
Admission: EM | Admit: 2012-12-05 | Discharge: 2012-12-08 | DRG: 191 | Disposition: A | Discharge status: Home / Self Care | Payer: Medicaid Other | Attending: Internal Medicine | Admitting: Internal Medicine

## 2012-12-05 DIAGNOSIS — J45901 Unspecified asthma with (acute) exacerbation: Principal | ICD-10-CM | POA: Diagnosis present

## 2012-12-05 DIAGNOSIS — R911 Solitary pulmonary nodule: Secondary | ICD-10-CM

## 2012-12-05 DIAGNOSIS — F17200 Nicotine dependence, unspecified, uncomplicated: Secondary | ICD-10-CM | POA: Diagnosis present

## 2012-12-05 DIAGNOSIS — J441 Chronic obstructive pulmonary disease with (acute) exacerbation: Principal | ICD-10-CM | POA: Diagnosis present

## 2012-12-05 DIAGNOSIS — E119 Type 2 diabetes mellitus without complications: Secondary | ICD-10-CM | POA: Diagnosis present

## 2012-12-05 DIAGNOSIS — R040 Epistaxis: Secondary | ICD-10-CM

## 2012-12-05 DIAGNOSIS — Z6841 Body Mass Index (BMI) 40.0 and over, adult: Secondary | ICD-10-CM

## 2012-12-05 DIAGNOSIS — Z79899 Other long term (current) drug therapy: Secondary | ICD-10-CM

## 2012-12-05 DIAGNOSIS — E669 Obesity, unspecified: Secondary | ICD-10-CM | POA: Diagnosis present

## 2012-12-05 DIAGNOSIS — R51 Headache: Secondary | ICD-10-CM | POA: Diagnosis present

## 2012-12-05 DIAGNOSIS — Z7982 Long term (current) use of aspirin: Secondary | ICD-10-CM

## 2012-12-05 DIAGNOSIS — K219 Gastro-esophageal reflux disease without esophagitis: Secondary | ICD-10-CM | POA: Diagnosis present

## 2012-12-05 DIAGNOSIS — J309 Allergic rhinitis, unspecified: Secondary | ICD-10-CM

## 2012-12-05 DIAGNOSIS — G4733 Obstructive sleep apnea (adult) (pediatric): Secondary | ICD-10-CM | POA: Diagnosis present

## 2012-12-05 DIAGNOSIS — Z8541 Personal history of malignant neoplasm of cervix uteri: Secondary | ICD-10-CM

## 2012-12-05 DIAGNOSIS — I10 Essential (primary) hypertension: Secondary | ICD-10-CM | POA: Diagnosis present

## 2012-12-05 DIAGNOSIS — Z9981 Dependence on supplemental oxygen: Secondary | ICD-10-CM

## 2012-12-05 DIAGNOSIS — J449 Chronic obstructive pulmonary disease, unspecified: Secondary | ICD-10-CM | POA: Diagnosis present

## 2012-12-05 DIAGNOSIS — F411 Generalized anxiety disorder: Secondary | ICD-10-CM | POA: Diagnosis present

## 2012-12-05 LAB — CBC
Hemoglobin: 13 g/dL (ref 12.0–15.0)
MCH: 30.2 pg (ref 26.0–34.0)
MCHC: 36.4 g/dL — ABNORMAL HIGH (ref 30.0–36.0)
RDW: 14.8 % (ref 11.5–15.5)

## 2012-12-05 LAB — BASIC METABOLIC PANEL
CO2: 28 mEq/L (ref 19–32)
Calcium: 9.5 mg/dL (ref 8.4–10.5)
Creatinine, Ser: 0.94 mg/dL (ref 0.50–1.10)
GFR calc non Af Amer: 68 mL/min — ABNORMAL LOW (ref >90)

## 2012-12-05 LAB — TROPONIN I: Troponin I: <0.3 ng/mL (ref <0.30)

## 2012-12-05 MED ORDER — METHYLPREDNISOLONE SODIUM SUCC 125 MG IJ SOLR
125.0000 mg | Freq: Once | INTRAMUSCULAR | Status: AC
  Administered 2012-12-05: 125 mg via INTRAVENOUS
  Filled 2012-12-05: qty 2

## 2012-12-05 MED ORDER — ALBUTEROL (5 MG/ML) CONTINUOUS INHALATION SOLN
10.0000 mg/h | INHALATION_SOLUTION | Freq: Once | RESPIRATORY_TRACT | Status: AC
  Administered 2012-12-05: 10 mg/h via RESPIRATORY_TRACT
  Filled 2012-12-05: qty 20

## 2012-12-05 NOTE — ED Notes (Signed)
Pt has been having painful breathing x 3 days; pt reports breathing being worse "when sun goes down"; pt also c/o pain in back bilaterally with inspiration; pt has also been coughing up white sputum and has filled up 3 cups of sputum; pt received 1 albuterol treatment en route; pt had expiratory wheezes and diminished breath sounds prior to treatment; pt does wear oxygen at home 2L; pt also reports having a fever at home yesterday (did not check; felt warm) along with chills and diaphoresis; pt denying those symptoms today; pt has cough present;

## 2012-12-05 NOTE — ED Provider Notes (Signed)
History     CSN: [id number]  Arrival date & time 12/05/12  1910   First MD Initiated Contact with Patient 12/05/12 1917      Chief Complaint  Patient presents with  . Shortness of Breath    (Consider location/radiation/quality/duration/timing/severity/associated sxs/prior treatment) HPI Comments: 57 y/o f h/o COPD p/w SOB and diffuse upper back pain. Feels like prior pna's. No chest pain. Subjective fever and chills last night. Not today. Cough productive of white sputum. No urinary changes.   Patient is a 57 y.o. female presenting with shortness of breath. The history is provided by the patient.  Shortness of Breath  Episode onset: 3 days ago. The onset was gradual. The problem occurs continuously. The problem has been gradually worsening. The problem is moderate. The symptoms are relieved by rest. The symptoms are aggravated by activity. Associated symptoms include a fever (subjective. yesterday), cough (3 days. white sputum today), shortness of breath and wheezing. Pertinent negatives include no chest pain and no rhinorrhea. The fever has been present for less than 1 day. Her temperature was unmeasured prior to arrival. The cough is productive. The cough is worsened by activity. Urine output has been normal.    Past Medical History  Diagnosis Date  . Borderline diabetes   . Hypertension   . Anxiety   . Emphysema   . COPD (chronic obstructive pulmonary disease)   . OSA on CPAP     CPAP at night   . Dizziness     pt believes this is motion sickness or vertigo  . GERD (gastroesophageal reflux disease)   . Headache   . Cancer 1990    cervical   . Anemia     Past Surgical History  Procedure Date  . Tubal ligation   . Cardiac catheterization   . Colonoscopy 09/05/2012    Procedure: COLONOSCOPY;  Surgeon: Theda Belfast, MD;  Location: [facility];  Service: Endoscopy;  Laterality: N/A;    Family History  Problem Relation Age of Onset  . Lung cancer Paternal Aunt   .  Lung cancer Paternal Grandfather     History  Substance Use Topics  . Smoking status: Current Every Day Smoker -- 1.0 packs/day for 36 years    Types: Cigarettes    Last Attempt to Quit: 05/14/2012  . Smokeless tobacco: Never Used     Comment: Approx 90 pk-yrs. Pt states smoking 5 cigs per day.  . Alcohol Use: No    OB History    Grav Para Term Preterm Abortions TAB SAB Ect Mult Living                  Review of Systems  Constitutional: Positive for fever (subjective. yesterday) and chills.  HENT: Positive for congestion. Negative for rhinorrhea, neck pain and neck stiffness.   Eyes: Negative for pain and visual disturbance.  Respiratory: Positive for cough (3 days. white sputum today), shortness of breath and wheezing.   Cardiovascular: Negative for chest pain and leg swelling.  Gastrointestinal: Positive for diarrhea (x4. NB). Negative for nausea, vomiting, abdominal pain and blood in stool.  Genitourinary: Negative for dysuria, hematuria, flank pain and difficulty urinating.  Musculoskeletal: Positive for back pain.  Skin: Negative for color change and rash.  Neurological: Negative for dizziness and headaches.  All other systems reviewed and are negative.    Allergies  Review of patient's allergies indicates no known allergies.  Home Medications   Current Outpatient Rx  Name  Route  Sig  Dispense  Refill  . ACETAMINOPHEN 500 MG PO TABS   Oral   Take 1,000 mg by mouth daily as needed. For pain         . ALBUTEROL SULFATE HFA 108 (90 BASE) MCG/ACT IN AERS   Inhalation   Inhale 2 puffs into the lungs every 4 (four) hours as needed for wheezing.   1 Inhaler   3   . ALBUTEROL SULFATE (2.5 MG/3ML) 0.083% IN NEBU   Nebulization   Take 2.5 mg by nebulization 4 (four) times daily as needed. DX: 496. For wheezing         . AMITRIPTYLINE HCL 50 MG PO TABS   Oral   Take 2 tablets (100 mg total) by mouth at bedtime. Further refills will need to come from her PCP    60 tablet   0   . ASPIRIN EC 81 MG PO TBEC   Oral   Take 81 mg by mouth daily.         . BUDESONIDE-FORMOTEROL FUMARATE 160-4.5 MCG/ACT IN AERO   Inhalation   Inhale 2 puffs into the lungs 2 (two) times daily.   1 Inhaler   3   . VITAMIN B-12 500 MCG SL SUBL   Sublingual   Place 1 tablet under the tongue daily.         Marland Kitchen FLUTICASONE PROPIONATE 50 MCG/ACT NA SUSP   Nasal   Place 2 sprays into the nose 2 (two) times daily.   16 g   11   . LORATADINE 10 MG PO TABS      take 1 tablet by mouth once daily   30 tablet   5   . OMEPRAZOLE 20 MG PO CPDR   Oral   Take 20 mg by mouth 2 (two) times daily.         Marland Kitchen POTASSIUM CHLORIDE CRYS ER 20 MEQ PO TBCR   Oral   Take 20 mEq by mouth 2 (two) times daily.         Marland Kitchen SALINE NASAL SPRAY 0.65 % NA SOLN   Nasal   Place 1 spray into the nose at bedtime as needed. For nasal congestion         . SPIRIVA HANDIHALER 18 MCG IN CAPS      inhale contents of 1 capsule by mouth once daily   30 capsule   5   . TRAMADOL HCL 50 MG PO TABS   Oral   Take 50 mg by mouth every 6 (six) hours as needed. pain         . TRIAMTERENE-HCTZ 37.5-25 MG PO CAPS      take 1 capsule by mouth once daily   30 capsule   3     BP 110/89  Pulse 95  Temp 98.4 F (36.9 C) (Oral)  Resp 29  SpO2 96%  Physical Exam  Nursing note and vitals reviewed. Constitutional: She is oriented to person, place, and time. She appears well-developed and well-nourished. No distress.  HENT:  Head: Normocephalic and atraumatic.  Mouth/Throat: Oropharynx is clear and moist.  Eyes: Conjunctivae normal are normal. Right eye exhibits no discharge. Left eye exhibits no discharge.  Neck: Normal range of motion. No tracheal deviation present.  Cardiovascular: Normal rate, regular rhythm, normal heart sounds and intact distal pulses.   Pulmonary/Chest: Effort normal. No stridor. No respiratory distress. She has wheezes (diffuse expiratory wheezes). She has no  rales.  Abdominal: Soft. She exhibits no distension. There is  no tenderness. There is no rigidity, no guarding and no CVA tenderness.  Musculoskeletal: She exhibits no edema and no tenderness.       Cervical back: Normal. She exhibits no tenderness, no bony tenderness and no deformity.       Thoracic back: She exhibits tenderness. She exhibits no bony tenderness and no deformity.       Lumbar back: Normal.       Diffuse upper back pain b/l. No focal bony tenderness.  Neurological: She is alert and oriented to person, place, and time.  Skin: Skin is warm and dry.  Psychiatric: She has a normal mood and affect. Her behavior is normal.    ED Course  Procedures (including critical care time)  Labs Reviewed  CBC - Abnormal; Notable for the following:    HCT 35.7 (*)     MCHC 36.4 (*)     All other components within normal limits  BASIC METABOLIC PANEL - Abnormal; Notable for the following:    Potassium 3.4 (*)     Glucose, Bld 117 (*)     GFR calc non Af Amer 68 (*)     GFR calc Af Amer 78 (*)     All other components within normal limits  TROPONIN I   Dg Chest Port 1 View  12/05/2012  *RADIOLOGY REPORT*  Clinical Data: Shortness of breath, cough, fever  PORTABLE CHEST - 1 VIEW  Comparison: 10/16/2012  Findings: Cardiomediastinal silhouette is unremarkable.  No acute infiltrate or pleural effusion.  No pulmonary edema.  Stable mild degenerative changes thoracic spine.  IMPRESSION: No active disease.  No significant change.   Original Report Authenticated By: Natasha Mead, M.D.      1. Chronic allergic rhinitis      Date: 12/05/2012  Rate: 93  Rhythm: normal sinus rhythm  QRS Axis: normal  Intervals: normal  ST/T Wave abnormalities: nonspecific T wave changes  Conduction Disutrbances:none  Narrative Interpretation:   Old EKG Reviewed: unchanged    MDM    57 y/o F h/o COPD, pna, HTN p/w SOB and associated diffuse upper back pain. Similar to prior pna's. Subjective fever and  chills yesterday. Productive cough x3 days. Some improvement with albuterol neb en route with EMS. Baseline 2L [place] at home.  HDS, af. O2 sats 98% on 2L [place]. Expiratory wheezes. Given cont albuterol treatment   Likely COPD exacerbation. Some improvement with solumedrol and continuous albuterol neb for one hour. Still with wheezing and subjective SOB. No resp distress. Admit to hospitalist service. Levaquin given per request.  Unlikely PNA as CXR neg, no leukocytosis, no fever Unlikely ACS as troponin neg, EKG as above Unlikely PE as atypical presentation, low risk per [facility] Doubt Aortic Dissection, Pancreatitis, Pneumothorax, Arrhythmia, Endo/Myo/Pericarditis, Esophageal pathology, or other Emergent pathology.    Labs and imaging reviewed by myself and considered in medical decision making if ordered. Imaging interpreted by radiology.   Discussed case with Dr. Ranae Palms who is in agreement with assessment and plan.         Stevie Kern, MD 12/06/12 [phone]

## 2012-12-05 NOTE — ED Notes (Signed)
MD at bedside. 

## 2012-12-06 ENCOUNTER — Encounter (HOSPITAL_COMMUNITY): Payer: Self-pay | Admitting: Internal Medicine

## 2012-12-06 DIAGNOSIS — I10 Essential (primary) hypertension: Secondary | ICD-10-CM

## 2012-12-06 DIAGNOSIS — J449 Chronic obstructive pulmonary disease, unspecified: Secondary | ICD-10-CM

## 2012-12-06 DIAGNOSIS — J309 Allergic rhinitis, unspecified: Secondary | ICD-10-CM

## 2012-12-06 DIAGNOSIS — E119 Type 2 diabetes mellitus without complications: Secondary | ICD-10-CM

## 2012-12-06 LAB — GLUCOSE, CAPILLARY
Glucose-Capillary: 143 mg/dL — ABNORMAL HIGH (ref 70–99)
Glucose-Capillary: 223 mg/dL — ABNORMAL HIGH (ref 70–99)

## 2012-12-06 MED ORDER — CYANOCOBALAMIN 500 MCG PO TABS
500.0000 ug | ORAL_TABLET | Freq: Every day | ORAL | Status: DC
  Administered 2012-12-06 – 2012-12-08 (×3): 500 ug via ORAL
  Filled 2012-12-06 (×3): qty 1

## 2012-12-06 MED ORDER — VITAMIN B-12 500 MCG SL SUBL: 1.0000 | SUBLINGUAL_TABLET | Freq: Every day | SUBLINGUAL | Status: DC

## 2012-12-06 MED ORDER — SODIUM CHLORIDE 0.9 % IV SOLN: 250.0000 mL | INTRAVENOUS | Status: DC | PRN

## 2012-12-06 MED ORDER — SODIUM CHLORIDE 0.9 % IJ SOLN
3.0000 mL | Freq: Two times a day (BID) | INTRAMUSCULAR | Status: DC
  Administered 2012-12-08: 3 mL via INTRAVENOUS

## 2012-12-06 MED ORDER — TRIAMTERENE-HCTZ 37.5-25 MG PO TABS
1.0000 | ORAL_TABLET | Freq: Every day | ORAL | Status: DC
  Administered 2012-12-06 – 2012-12-08 (×3): 1 via ORAL
  Filled 2012-12-06 (×3): qty 1

## 2012-12-06 MED ORDER — ONDANSETRON HCL 4 MG/2ML IJ SOLN: 4.0000 mg | Freq: Four times a day (QID) | INTRAMUSCULAR | Status: DC | PRN

## 2012-12-06 MED ORDER — ACETAMINOPHEN 500 MG PO TABS: 1000.0000 mg | ORAL_TABLET | Freq: Every day | ORAL | Status: DC | PRN

## 2012-12-06 MED ORDER — LEVOFLOXACIN 500 MG PO TABS
500.0000 mg | ORAL_TABLET | Freq: Once | ORAL | Status: AC
  Administered 2012-12-06: 500 mg via ORAL
  Filled 2012-12-06: qty 1

## 2012-12-06 MED ORDER — AMITRIPTYLINE HCL 100 MG PO TABS
100.0000 mg | ORAL_TABLET | Freq: Every day | ORAL | Status: DC
  Administered 2012-12-06 – 2012-12-07 (×2): 100 mg via ORAL
  Filled 2012-12-06 (×3): qty 1

## 2012-12-06 MED ORDER — ALBUTEROL SULFATE (5 MG/ML) 0.5% IN NEBU
2.5000 mg | INHALATION_SOLUTION | Freq: Four times a day (QID) | RESPIRATORY_TRACT | Status: DC
  Administered 2012-12-06 – 2012-12-08 (×10): 2.5 mg via RESPIRATORY_TRACT
  Filled 2012-12-06 (×11): qty 0.5

## 2012-12-06 MED ORDER — METHYLPREDNISOLONE SODIUM SUCC 125 MG IJ SOLR
60.0000 mg | Freq: Four times a day (QID) | INTRAMUSCULAR | Status: DC
  Administered 2012-12-06 (×2): 60 mg via INTRAVENOUS
  Administered 2012-12-06: 05:00:00 via INTRAVENOUS
  Administered 2012-12-07 (×2): 60 mg via INTRAVENOUS
  Filled 2012-12-06 (×9): qty 0.96

## 2012-12-06 MED ORDER — BUDESONIDE-FORMOTEROL FUMARATE 160-4.5 MCG/ACT IN AERO
2.0000 | INHALATION_SPRAY | Freq: Two times a day (BID) | RESPIRATORY_TRACT | Status: DC
  Administered 2012-12-06 – 2012-12-08 (×4): 2 via RESPIRATORY_TRACT
  Filled 2012-12-06: qty 6

## 2012-12-06 MED ORDER — TIOTROPIUM BROMIDE MONOHYDRATE 18 MCG IN CAPS
18.0000 ug | ORAL_CAPSULE | Freq: Every day | RESPIRATORY_TRACT | Status: DC
  Administered 2012-12-07 – 2012-12-08 (×2): 18 ug via RESPIRATORY_TRACT
  Filled 2012-12-06: qty 5

## 2012-12-06 MED ORDER — INSULIN ASPART 100 UNIT/ML ~~LOC~~ SOLN
0.0000 [IU] | Freq: Three times a day (TID) | SUBCUTANEOUS | Status: DC
  Administered 2012-12-06 (×2): 7 [IU] via SUBCUTANEOUS
  Administered 2012-12-06: 3 [IU] via SUBCUTANEOUS
  Administered 2012-12-07 (×2): 7 [IU] via SUBCUTANEOUS
  Administered 2012-12-07: 3 [IU] via SUBCUTANEOUS
  Administered 2012-12-08: 4 [IU] via SUBCUTANEOUS

## 2012-12-06 MED ORDER — ALBUTEROL (5 MG/ML) CONTINUOUS INHALATION SOLN
10.0000 mg/h | INHALATION_SOLUTION | Freq: Once | RESPIRATORY_TRACT | Status: AC
  Administered 2012-12-06: 10 mg/h via RESPIRATORY_TRACT

## 2012-12-06 MED ORDER — ALBUTEROL SULFATE (5 MG/ML) 0.5% IN NEBU
2.5000 mg | INHALATION_SOLUTION | RESPIRATORY_TRACT | Status: DC | PRN
  Administered 2012-12-07: 2.5 mg via RESPIRATORY_TRACT
  Filled 2012-12-06 (×2): qty 0.5

## 2012-12-06 MED ORDER — POTASSIUM CHLORIDE CRYS ER 20 MEQ PO TBCR
20.0000 meq | EXTENDED_RELEASE_TABLET | Freq: Two times a day (BID) | ORAL | Status: DC
  Administered 2012-12-06 – 2012-12-08 (×5): 20 meq via ORAL
  Filled 2012-12-06 (×6): qty 1

## 2012-12-06 MED ORDER — ENOXAPARIN SODIUM 40 MG/0.4ML ~~LOC~~ SOLN
40.0000 mg | SUBCUTANEOUS | Status: DC
  Administered 2012-12-06 – 2012-12-08 (×3): 40 mg via SUBCUTANEOUS
  Filled 2012-12-06 (×3): qty 0.4

## 2012-12-06 MED ORDER — DOCUSATE SODIUM 100 MG PO CAPS
100.0000 mg | ORAL_CAPSULE | Freq: Two times a day (BID) | ORAL | Status: DC
  Administered 2012-12-06 – 2012-12-08 (×5): 100 mg via ORAL
  Filled 2012-12-06 (×6): qty 1

## 2012-12-06 MED ORDER — LEVOFLOXACIN IN D5W 500 MG/100ML IV SOLN
500.0000 mg | INTRAVENOUS | Status: DC
  Administered 2012-12-06 – 2012-12-07 (×2): 500 mg via INTRAVENOUS
  Filled 2012-12-06 (×3): qty 100

## 2012-12-06 MED ORDER — ASPIRIN EC 81 MG PO TBEC
81.0000 mg | DELAYED_RELEASE_TABLET | Freq: Every day | ORAL | Status: DC
  Administered 2012-12-06 – 2012-12-08 (×3): 81 mg via ORAL
  Filled 2012-12-06 (×3): qty 1

## 2012-12-06 MED ORDER — INSULIN ASPART 100 UNIT/ML ~~LOC~~ SOLN
0.0000 [IU] | Freq: Every day | SUBCUTANEOUS | Status: DC
  Administered 2012-12-07: 2 [IU] via SUBCUTANEOUS

## 2012-12-06 MED ORDER — FLUTICASONE PROPIONATE 50 MCG/ACT NA SUSP
2.0000 | Freq: Two times a day (BID) | NASAL | Status: DC
  Administered 2012-12-06 – 2012-12-07 (×4): 2 via NASAL
  Filled 2012-12-06 (×2): qty 16

## 2012-12-06 MED ORDER — ZOLPIDEM TARTRATE 5 MG PO TABS
5.0000 mg | ORAL_TABLET | Freq: Every evening | ORAL | Status: DC | PRN
  Administered 2012-12-06 – 2012-12-07 (×2): 5 mg via ORAL
  Filled 2012-12-06 (×2): qty 1

## 2012-12-06 MED ORDER — ALUM & MAG HYDROXIDE-SIMETH 200-200-20 MG/5ML PO SUSP: 30.0000 mL | Freq: Four times a day (QID) | ORAL | Status: DC | PRN

## 2012-12-06 MED ORDER — TRIAMTERENE-HCTZ 37.5-25 MG PO CAPS
1.0000 | ORAL_CAPSULE | Freq: Every day | ORAL | Status: DC
Start: 2012-12-06 — End: 2012-12-06
  Filled 2012-12-06: qty 1

## 2012-12-06 MED ORDER — SODIUM CHLORIDE 0.9 % IJ SOLN: 3.0000 mL | INTRAMUSCULAR | Status: DC | PRN

## 2012-12-06 MED ORDER — PANTOPRAZOLE SODIUM 40 MG PO TBEC
40.0000 mg | DELAYED_RELEASE_TABLET | Freq: Every day | ORAL | Status: DC
  Administered 2012-12-06 – 2012-12-08 (×3): 40 mg via ORAL
  Filled 2012-12-06 (×3): qty 1

## 2012-12-06 MED ORDER — TRAMADOL HCL 50 MG PO TABS
50.0000 mg | ORAL_TABLET | Freq: Four times a day (QID) | ORAL | Status: DC | PRN
  Administered 2012-12-07 (×2): 50 mg via ORAL
  Filled 2012-12-06 (×2): qty 1

## 2012-12-06 MED ORDER — SODIUM CHLORIDE 0.9 % IJ SOLN
3.0000 mL | Freq: Two times a day (BID) | INTRAMUSCULAR | Status: DC
  Administered 2012-12-06 – 2012-12-08 (×4): 3 mL via INTRAVENOUS

## 2012-12-06 MED ORDER — ONDANSETRON HCL 4 MG PO TABS: 4.0000 mg | ORAL_TABLET | Freq: Four times a day (QID) | ORAL | Status: DC | PRN

## 2012-12-06 NOTE — ED Provider Notes (Signed)
Pt admitted for COPD exacerbation. Nurse called me into room with concerns of patient having significant difficulty breathing.  Pt last continuous neb Tx at 8pm yesterday. Albuterol on continuous Neb re-ordered because patients lungs are tight and patient is having inspiratory and expiratory wheezes. Bipap put on patient by nurse. O2 sats stayed above 90%. Pt does not appear fatigued. Respiratory Therapy in room and is starting Neb Tx.   Dorthula Matas, PA 12/06/12 0130

## 2012-12-06 NOTE — ED Provider Notes (Signed)
I saw and evaluated the patient, reviewed the resident's note and I agree with the findings and plan.   Loren Racer, MD 12/06/12 [phone]

## 2012-12-06 NOTE — ED Provider Notes (Signed)
Medical screening examination/treatment/procedure(s) were performed by non-physician practitioner and as supervising physician I was immediately available for consultation/collaboration.   Nyheem Binette, MD 12/06/12 1533 

## 2012-12-06 NOTE — ED Notes (Signed)
Nebulizer treatment given to pt; RN reassessed pt; pt still has inspiratory and expiratory wheezes;

## 2012-12-06 NOTE — ED Notes (Signed)
Pts monitor starting to alarm stating pt was in asystole; RN went to check on pt; pt found to be in a coughing fit stating she was not able to catch her breath; pt looked like she was having difficulty breathing; pt placed on non-rebreather with O2 sats in mid 90's on 6L; pt on assessment with expiratory and inspiratory wheezes with chest tightness; PA notified; PA assessed pt; continuous nebulizer ordered; RT at bedside starting continuous nebulizer;

## 2012-12-06 NOTE — H&P (Signed)
[facility] History and Physical  Stephanie Schultz EAV:[id number] DOB: 10-02-1956    PCP:   Dorrene German, MD   Chief Complaint: increase shortness of breath and wheezing.  HPI: Stephanie Schultz is an 57 y.o. female with hx of severe COPD and asthma, one home oxygen of 2 L [place], obesity, sleep apnea, HTN, GERD, presents to the ER with 3 days hx of increase white sputum productive cough, increase shortness of breath and wheezing, unrelieved with nebulizer at home.  She denied any fever or chills, chest pain only with coughing.  Evaluation in the ER included a negative CXR, a normal WBC, normal renal fx tests.  Her troponin was negative.  She was given several nebs, IV solumedrol, and IV antibiotic and hospitalist was asked to admit her for COPD exacerbation.  Rewiew of Systems:  Constitutional: Negative for malaise, fever and chills. No significant weight loss or weight gain Eyes: Negative for eye pain, redness and discharge, diplopia, visual changes, or flashes of light. ENMT: Negative for ear pain, hoarseness, nasal congestion, sinus pressure and sore throat. No headaches; tinnitus, drooling, or problem swallowing. Cardiovascular: Negative for palpitations, diaphoresis, and peripheral edema. ; No orthopnea, PND Respiratory: Negative for hemoptysis,  and stridor. No pleuritic chestpain. Gastrointestinal: Negative for nausea, vomiting, diarrhea, constipation, abdominal pain, melena, blood in stool, hematemesis, jaundice and rectal bleeding.    Genitourinary: Negative for frequency, dysuria, incontinence,flank pain and hematuria; Musculoskeletal: Negative for back pain and neck pain. Negative for swelling and trauma.;  Skin: . Negative for pruritus, rash, abrasions, bruising and skin lesion.; ulcerations Neuro: Negative for headache, lightheadedness and neck stiffness. Negative for weakness, altered level of consciousness , altered mental status, extremity weakness, burning feet, involuntary  movement, seizure and syncope.  Psych: negative for anxiety, depression, insomnia, tearfulness, panic attacks, hallucinations, paranoia, suicidal or homicidal ideation    Past Medical History  Diagnosis Date  . Borderline diabetes   . Hypertension   . Anxiety   . Emphysema   . COPD (chronic obstructive pulmonary disease)   . OSA on CPAP     CPAP at night   . Dizziness     pt believes this is motion sickness or vertigo  . GERD (gastroesophageal reflux disease)   . Headache   . Cancer 1990    cervical   . Anemia     Past Surgical History  Procedure Date  . Tubal ligation   . Cardiac catheterization   . Colonoscopy 09/05/2012    Procedure: COLONOSCOPY;  Surgeon: Theda Belfast, MD;  Location: [facility];  Service: Endoscopy;  Laterality: N/A;    Medications:  HOME MEDS: Prior to Admission medications   Medication Sig Start Date End Date Taking? Authorizing Provider  acetaminophen (TYLENOL) 500 MG tablet Take 1,000 mg by mouth daily as needed. For pain   Yes Historical Provider, MD  albuterol (PROVENTIL HFA;VENTOLIN HFA) 108 (90 BASE) MCG/ACT inhaler Inhale 2 puffs into the lungs every 4 (four) hours as needed for wheezing. 11/25/12 11/25/13 Yes Leslye Peer, MD  albuterol (PROVENTIL) (2.5 MG/3ML) 0.083% nebulizer solution Take 2.5 mg by nebulization 4 (four) times daily as needed. DX: 496. For wheezing 11/25/12 11/25/13 Yes Leslye Peer, MD  amitriptyline (ELAVIL) 50 MG tablet Take 2 tablets (100 mg total) by mouth at bedtime. Further refills will need to come from her PCP 07/25/12  Yes Leslye Peer, MD  aspirin EC 81 MG tablet Take 81 mg by mouth daily.   Yes Historical Provider, MD  budesonide-formoterol (SYMBICORT) 160-4.5 MCG/ACT inhaler Inhale 2 puffs into the lungs 2 (two) times daily. 11/26/12  Yes Leslye Peer, MD  Cyanocobalamin (VITAMIN B-12) 500 MCG SUBL Place 1 tablet under the tongue daily.   Yes Historical Provider, MD  fluticasone (FLONASE) 50 MCG/ACT nasal  spray Place 2 sprays into the nose 2 (two) times daily. 07/08/12 07/08/13 Yes Leslye Peer, MD  loratadine (CLARITIN) 10 MG tablet take 1 tablet by mouth once daily 11/14/12  Yes Leslye Peer, MD  omeprazole (PRILOSEC) 20 MG capsule Take 20 mg by mouth 2 (two) times daily.   Yes Historical Provider, MD  potassium chloride SA (K-DUR,KLOR-CON) 20 MEQ tablet Take 20 mEq by mouth 2 (two) times daily.   Yes Historical Provider, MD  sodium chloride (OCEAN) 0.65 % nasal spray Place 1 spray into the nose at bedtime as needed. For nasal congestion   Yes Historical Provider, MD  SPIRIVA HANDIHALER 18 MCG inhalation capsule inhale contents of 1 capsule by mouth once daily 06/11/12  Yes Leslye Peer, MD  traMADol (ULTRAM) 50 MG tablet Take 50 mg by mouth every 6 (six) hours as needed. pain   Yes Historical Provider, MD  triamterene-hydrochlorothiazide (DYAZIDE) 37.5-25 MG per capsule take 1 capsule by mouth once daily 08/11/12  Yes Leslye Peer, MD     Allergies:  No Known Allergies  Social History:   reports that she has been smoking Cigarettes.  She has a 36 pack-year smoking history. She has never used smokeless tobacco. She reports that she does not drink alcohol or use illicit drugs.  Family History: Family History  Problem Relation Age of Onset  . Lung cancer Paternal Aunt   . Lung cancer Paternal Grandfather      Physical Exam: Filed Vitals:   12/06/12 0230 12/06/12 0245 12/06/12 0300 12/06/12 0348  BP: 120/49 134/61 127/66 127/66  Pulse: 109 113 113 115  Temp:    98.4 F (36.9 C)  TempSrc:    Oral  Resp:    21  Height:    5' (1.524 m)  Weight:    94.847 kg (209 lb 1.6 oz)  SpO2: 100% 99% 99% 97%   Blood pressure 127/66, pulse 115, temperature 98.4 F (36.9 C), temperature source Oral, resp. rate 21, height 5' (1.524 m), weight 94.847 kg (209 lb 1.6 oz), SpO2 97.00%.  GEN:  Pleasant  patient lying in the stretcher in no acute distress; cooperative with exam. PSYCH:  alert and  oriented x4; does not appear anxious or depressed; affect is appropriate. HEENT: Mucous membranes pink and anicteric; PERRLA; EOM intact; no cervical lymphadenopathy nor thyromegaly or carotid bruit; no JVD; There were no stridor. Neck is very supple. Breasts:: Not examined CHEST WALL: No tenderness CHEST: She has tight insp and exp wheezing, but able to complete her sentences and uses no accessory muscles. HEART: Regular rate and rhythm.  There are no murmur, rub, or gallops.   BACK: No kyphosis or scoliosis; no CVA tenderness ABDOMEN: soft and non-tender; no masses, no organomegaly, normal abdominal bowel sounds; no pannus; no intertriginous candida. There is no rebound and no distention. Rectal Exam: Not done EXTREMITIES: No bone or joint deformity; age-appropriate arthropathy of the hands and knees; no edema; no ulcerations.  There is no calf tenderness. Genitalia: not examined PULSES: 2+ and symmetric SKIN: Normal hydration no rash or ulceration CNS: Cranial nerves 2-12 grossly intact no focal lateralizing neurologic deficit.  Speech is fluent; uvula elevated with phonation, facial symmetry  and tongue midline. DTR are normal bilaterally, cerebella exam is intact, barbinski is negative and strengths are equaled bilaterally.  No sensory loss.   Labs on Admission:  Basic Metabolic Panel:  Lab 12/05/12 1610  NA 139  K 3.4*  CL 101  CO2 28  GLUCOSE 117*  BUN 8  CREATININE 0.94  CALCIUM 9.5  MG --  PHOS --   Liver Function Tests: No results found for this basename: AST:5,ALT:5,ALKPHOS:5,BILITOT:5,PROT:5,ALBUMIN:5 in the last 168 hours No results found for this basename: LIPASE:5,AMYLASE:5 in the last 168 hours No results found for this basename: AMMONIA:5 in the last 168 hours CBC:  Lab 12/05/12 1941  WBC 5.7  NEUTROABS --  HGB 13.0  HCT 35.7*  MCV 82.8  PLT 235   Cardiac Enzymes:  Lab 12/05/12 1941  CKTOTAL --  CKMB --  CKMBINDEX --  TROPONINI <0.30    CBG:  Lab  12/06/12 0603  GLUCAP 218*     Radiological Exams on Admission: Dg Chest Port 1 View  12/05/2012  *RADIOLOGY REPORT*  Clinical Data: Shortness of breath, cough, fever  PORTABLE CHEST - 1 VIEW  Comparison: 10/16/2012  Findings: Cardiomediastinal silhouette is unremarkable.  No acute infiltrate or pleural effusion.  No pulmonary edema.  Stable mild degenerative changes thoracic spine.  IMPRESSION: No active disease.  No significant change.   Original Report Authenticated By: Natasha Mead, M.D.     Assessment/Plan Present on Admission:  . COPD (chronic obstructive pulmonary disease) . Obstructive sleep apnea . Hypertension . Diabetes mellitus . GERD (gastroesophageal reflux disease)   PLAN:  She is admitted to telemetry for COPD exacerbation.  I will give her supplemental oxygen.  She will be given frequent nebs and IV solumedrol.  Since there is evidence (NEJM, 2013) that inpatient COPD does better with antibiotics, will give her Levaquin as well.  For her DM, will need to add additional resistant SSI.  She is quite wheezy, but stable enough for admission to telemetry.  She is a full code and will be under [facility] service.  Thank you for allowing me to partake in the care of your nice patient.  Other plans as per orders.  Code Status: FULL Unk Lightning, MD. [facility] Pager [phone] 7pm to 7am.  12/06/2012, 6:29 AM

## 2012-12-07 DIAGNOSIS — G4733 Obstructive sleep apnea (adult) (pediatric): Secondary | ICD-10-CM

## 2012-12-07 DIAGNOSIS — J441 Chronic obstructive pulmonary disease with (acute) exacerbation: Secondary | ICD-10-CM

## 2012-12-07 LAB — GLUCOSE, CAPILLARY
Glucose-Capillary: 214 mg/dL — ABNORMAL HIGH (ref 70–99)
Glucose-Capillary: 235 mg/dL — ABNORMAL HIGH (ref 70–99)

## 2012-12-07 MED ORDER — METHYLPREDNISOLONE SODIUM SUCC 125 MG IJ SOLR
80.0000 mg | Freq: Four times a day (QID) | INTRAMUSCULAR | Status: AC
  Administered 2012-12-07 – 2012-12-08 (×4): 80 mg via INTRAVENOUS
  Filled 2012-12-07 (×4): qty 1.28

## 2012-12-07 MED ORDER — PREDNISONE 50 MG PO TABS
60.0000 mg | ORAL_TABLET | Freq: Every day | ORAL | Status: DC
  Administered 2012-12-08: 60 mg via ORAL
  Filled 2012-12-07 (×2): qty 1

## 2012-12-07 NOTE — Progress Notes (Signed)
[facility] PROGRESS NOTE Assessment/Plan: COPD (chronic obstructive pulmonary disease) (04/04/2011) - solumedrol, inhalers and antibiotics started on 1.25.2014. - still SOB with ambulation, wheezing B/l on physical exam. -   Diabetes mellitus (04/04/2011) - good controlled, cont current treatment.  Hypertension (04/04/2011) - well controlled.  Obstructive sleep apnea (04/04/2011) - bi-pap  Code Status: full Family Communication: none  Disposition Plan: home in am   Consultants:  none  Procedures:  none  Antibiotics:  levaquin  HPI/Subjective: Relates her SOB is improved but still dyspneic with ambulation  Objective: Filed Vitals:   12/06/12 2013 12/06/12 2241 12/07/12 0156 12/07/12 0446  BP: 152/76   118/57  Pulse: 88 93  90  Temp: 97.6 F (36.4 C)   98.4 F (36.9 C)  TempSrc: Oral   Oral  Resp: 19 20  18   Height:      Weight:      SpO2: 98% 100% 100% 97%    Intake/Output Summary (Last 24 hours) at 12/07/12 0811 Last data filed at 12/06/12 0932  Gross per 24 hour  Intake      0 ml  Output    400 ml  Net   -400 ml   Filed Weights   12/06/12 0348  Weight: 94.847 kg (209 lb 1.6 oz)    Exam:  General: Alert, awake, oriented x3, in no acute distress.  HEENT: No bruits, no goiter.  Heart: Regular rate and rhythm, without murmurs, rubs, gallops.  Lungs: Good air movement, wheezing B/L Abdomen: Soft, nontender, nondistended, positive bowel sounds.  Neuro: Grossly intact, nonfocal.   Data Reviewed: Basic Metabolic Panel:  Lab 12/05/12 1610  NA 139  K 3.4*  CL 101  CO2 28  GLUCOSE 117*  BUN 8  CREATININE 0.94  CALCIUM 9.5  MG --  PHOS --   Liver Function Tests: No results found for this basename: AST:5,ALT:5,ALKPHOS:5,BILITOT:5,PROT:5,ALBUMIN:5 in the last 168 hours No results found for this basename: LIPASE:5,AMYLASE:5 in the last 168 hours No results found for this basename: AMMONIA:5 in the last 168 hours CBC:  Lab 12/05/12  1941  WBC 5.7  NEUTROABS --  HGB 13.0  HCT 35.7*  MCV 82.8  PLT 235   Cardiac Enzymes:  Lab 12/05/12 1941  CKTOTAL --  CKMB --  CKMBINDEX --  TROPONINI <0.30   BNP (last 3 results)  Basename 12/26/11 1620  PROBNP 5.0   CBG:  Lab 12/07/12 0553 12/06/12 2057 12/06/12 1629 12/06/12 1122 12/06/12 0603  GLUCAP 150* 147* 223* 143* 218*    No results found for this or any previous visit (from the past 240 hour(s)).   Studies: Dg Chest Port 1 View  12/05/2012  *RADIOLOGY REPORT*  Clinical Data: Shortness of breath, cough, fever  PORTABLE CHEST - 1 VIEW  Comparison: 10/16/2012  Findings: Cardiomediastinal silhouette is unremarkable.  No acute infiltrate or pleural effusion.  No pulmonary edema.  Stable mild degenerative changes thoracic spine.  IMPRESSION: No active disease.  No significant change.   Original Report Authenticated By: Natasha Mead, M.D.     Scheduled Meds:   . albuterol  2.5 mg Nebulization Q6H  . amitriptyline  100 mg Oral QHS  . aspirin EC  81 mg Oral Daily  . budesonide-formoterol  2 puff Inhalation BID  . vitamin B-12  500 mcg Oral Daily  . docusate sodium  100 mg Oral BID  . enoxaparin (LOVENOX) injection  40 mg Subcutaneous Q24H  . fluticasone  2 spray Each Nare BID  . insulin aspart  0-20 Units Subcutaneous TID WC  . insulin aspart  0-5 Units Subcutaneous QHS  . levofloxacin (LEVAQUIN) IV  500 mg Intravenous Q24H  . pantoprazole  40 mg Oral Daily  . potassium chloride SA  20 mEq Oral BID  . predniSONE  60 mg Oral Q breakfast  . sodium chloride  3 mL Intravenous Q12H  . sodium chloride  3 mL Intravenous Q12H  . tiotropium  18 mcg Inhalation Daily  . triamterene-hydrochlorothiazide  1 each Oral Daily   Continuous Infusions:    Marinda Elk  [facility] Pager [phone].  If 8PM-8AM, please contact night-coverage at [email or web address], password [facility] 12/07/2012, 8:11 AM  LOS: 2 days

## 2012-12-08 DIAGNOSIS — J449 Chronic obstructive pulmonary disease, unspecified: Secondary | ICD-10-CM

## 2012-12-08 LAB — GLUCOSE, CAPILLARY

## 2012-12-08 MED ORDER — LEVOFLOXACIN 750 MG PO TABS: 750.0000 mg | ORAL_TABLET | Freq: Every day | ORAL | Status: DC

## 2012-12-08 MED ORDER — PREDNISONE 10 MG PO TABS: ORAL_TABLET | ORAL | Status: DC

## 2012-12-08 NOTE — Progress Notes (Signed)
Utilization Review Completed.Stephanie Schultz T12/05/2013  

## 2012-12-08 NOTE — Discharge Summary (Signed)
Physician Discharge Summary  Stephanie Schultz ZOX:[id number] DOB: 01-01-1956 DOA: 12/05/2012  PCP: Dorrene German, MD  Admit date: 12/05/2012 Discharge date: 12/08/2012  Time spent: 30 minutes  Recommendations for Outpatient Follow-up:  1. Follow up with PCP  Discharge Diagnoses:  Principal Problem:  *COPD with exacerbation Active Problems:  Diabetes mellitus  Hypertension  Obstructive sleep apnea  GERD (gastroesophageal reflux disease)   Discharge Condition: stable  Diet recommendation: heart healthy   Filed Weights   12/06/12 0348  Weight: 94.847 kg (209 lb 1.6 oz)    History of present illness:  57 y.o. female with hx of severe COPD and asthma, one home oxygen of 2 L [place], obesity, sleep apnea, HTN, GERD, presents to the ER with 3 days hx of increase white sputum productive cough, increase shortness of breath and wheezing, unrelieved with nebulizer at home. She denied any fever or chills, chest pain only with coughing. Evaluation in the ER included a negative CXR, a normal WBC, normal renal fx tests. Her troponin was negative. She was given several nebs, IV solumedrol, and IV antibiotic and hospitalist was asked to admit her for COPD exacerbation   Hospital Course:  COPD (chronic obstructive pulmonary disease) (04/04/2011) - solumedrol, inhalers and antibiotics started on 1.25.2014.  - transition to oral steroids and will continue tapered as an outpatient. Also cont antibiotics for additional 5 days. - most likely 2/2 to ongoing smoking.  Diabetes mellitus (04/04/2011) - good controlled, cont current treatment.   Hypertension (04/04/2011) - well controlled.   Obstructive sleep apnea (04/04/2011) - bi-pap   Procedures:  None  Consultations:  None    Discharge Exam: Filed Vitals:   12/07/12 2105 12/08/12 0204 12/08/12 0411 12/08/12 0753  BP: 125/63  129/68   Pulse: 88  96   Temp: 97.3 F (36.3 C)  98 F (36.7 C)   TempSrc: Oral  Oral   Resp: 18  19     Height:      Weight:      SpO2: 97% 97% 99% 97%    General: A&O x3 Cardiovascular: RRR Respiratory: good air movement CTA B/L  Discharge Instructions  Discharge Orders    Future Orders Please Complete By Expires   Diet - low sodium heart healthy      Increase activity slowly          Medication List     As of 12/08/2012  9:55 AM    TAKE these medications         acetaminophen 500 MG tablet   Commonly known as: TYLENOL   Take 1,000 mg by mouth daily as needed. For pain      albuterol 108 (90 BASE) MCG/ACT inhaler   Commonly known as: PROVENTIL HFA;VENTOLIN HFA   Inhale 2 puffs into the lungs every 4 (four) hours as needed for wheezing.      albuterol (2.5 MG/3ML) 0.083% nebulizer solution   Commonly known as: PROVENTIL   Take 2.5 mg by nebulization 4 (four) times daily as needed. DX: 496.  For wheezing      amitriptyline 50 MG tablet   Commonly known as: ELAVIL   Take 2 tablets (100 mg total) by mouth at bedtime. Further refills will need to come from her PCP      aspirin EC 81 MG tablet   Take 81 mg by mouth daily.      budesonide-formoterol 160-4.5 MCG/ACT inhaler   Commonly known as: SYMBICORT   Inhale 2 puffs into the lungs 2 (two)  times daily.      fluticasone 50 MCG/ACT nasal spray   Commonly known as: FLONASE   Place 2 sprays into the nose 2 (two) times daily.      levofloxacin 750 MG tablet   Commonly known as: LEVAQUIN   Take 1 tablet (750 mg total) by mouth daily.      loratadine 10 MG tablet   Commonly known as: CLARITIN   take 1 tablet by mouth once daily      omeprazole 20 MG capsule   Commonly known as: PRILOSEC   Take 20 mg by mouth 2 (two) times daily.      potassium chloride SA 20 MEQ tablet   Commonly known as: K-DUR,KLOR-CON   Take 20 mEq by mouth 2 (two) times daily.      predniSONE 10 MG tablet   Commonly known as: DELTASONE   Takes 6 tablets for 1 days, then 5 tablets for 1 days, then 4 tablets for 1 days, then 3 tablets for  1 days, then 2 tabs for 1 days, then 1 tab for 1 days, and then stop.      sodium chloride 0.65 % nasal spray   Commonly known as: OCEAN   Place 1 spray into the nose at bedtime as needed. For nasal congestion      SPIRIVA HANDIHALER 18 MCG inhalation capsule   Generic drug: tiotropium   inhale contents of 1 capsule by mouth once daily      traMADol 50 MG tablet   Commonly known as: ULTRAM   Take 50 mg by mouth every 6 (six) hours as needed. pain      triamterene-hydrochlorothiazide 37.5-25 MG per capsule   Commonly known as: DYAZIDE   take 1 capsule by mouth once daily      Vitamin B-12 500 MCG Subl   Place 1 tablet under the tongue daily.           Follow-up Information    Follow up with AVBUERE,EDWIN A, MD. In 2 weeks. (hospital follow up)    Contact information:   [address] [place] [address] [phone]           The results of significant diagnostics from this hospitalization (including imaging, microbiology, ancillary and laboratory) are listed below for reference.    Significant Diagnostic Studies: US Breast Right  11/20/2012  *RADIOLOGY REPORT*  Clinical Data:  Patient presents for a 39-month follow-up diagnostic right breast mammogram and ultrasound to evaluate a mass at the 6 o'clock position.  The patient also states new palpable abnormality in the right axilla.  DIGITAL DIAGNOSTIC RIGHT MAMMOGRAM WITH CAD AND RIGHT BREAST ULTRASOUND:  Comparison:  04/18/2012 and ultrasounds 07/22/2009, 08/31/2008 and 08/09/2008  Findings:  ACR Breast Density Category 2: There is a scattered fibroglandular pattern.  Exam demonstrates no change in mass of the upper outer right breast which was previously biopsied and found to be benign.  There is no significant change in a circumscribed ovoid mass over the lower central right breast.  There is no change in a 5 mm mass of the upper outer right breast.  There is a new 6 mm nodular density over the upper outer right breast  in the posterior third.  Mammographic images were processed with CAD.  On physical exam, I palpate a superficial 3-4 mm nodular density of the right axilla likely dermal in origin.  Ultrasound is performed, showing a well defined ovoid hypoechoic focus with increased through transmission within the skin over  the right axilla corresponding to patient's palpable abnormality and measuring 2 x 4 x 5 mm compatible with a sebaceous cyst or epidermal inclusion cyst.  Ultrasound over the 6 o'clock position 8 cm from the nipple again demonstrates a circumscribed ovoid hypoechoic mass with increased through transmission measuring 0.6 x 1.0 x 1.1 cm likely benign.   Ultrasound over the upper outer right breast 15 cm from the nipple demonstrates a  6 mm intramammary lymph node.  Ultrasound also at the 11 o'clock position 18 cm from the nipple demonstrates a small hypoechoic focus with echogenic halo measuring 7 x 8 x 8 mm likely corresponding to the new mammographic nodular density.  This may represent a focus of fat necrosis, although is indeterminate.  IMPRESSION: Stable probable benign mass at the 6 o'clock position of the right breast 8 cm from the nipple.  5 mm dermatologic lesion of the right axilla corresponding to patient's palpable abnormality.  New indeterminate  7 x 8 x 8 mm hypoechoic focus with echogenic halo over the 11 o'clock position of the right breast 18 cm from the nipple.  RECOMMENDATION: Recommend ultrasound-guided core biopsy of the indeterminate mass at the 11 o'clock position 18 cm from the nipple.  Also recommend continued 79-month follow-up diagnostic right breast mammogram and ultrasound of the probable benign mass at the 6 o'clock position. Recommend follow-up of patient's palpable dermatologic lesion on a clinical basis.  I have discussed the findings and recommendations with the patient. Results were also provided in writing at the conclusion of the visit.   BI-RADS CATEGORY 4:  Suspicious  abnormality - biopsy should be considered.  Biopsy scheduled for 11/28/2012.   Original Report Authenticated By: Elberta Fortis, M.D.    Dg Chest Port 1 View  12/05/2012  *RADIOLOGY REPORT*  Clinical Data: Shortness of breath, cough, fever  PORTABLE CHEST - 1 VIEW  Comparison: 10/16/2012  Findings: Cardiomediastinal silhouette is unremarkable.  No acute infiltrate or pleural effusion.  No pulmonary edema.  Stable mild degenerative changes thoracic spine.  IMPRESSION: No active disease.  No significant change.   Original Report Authenticated By: Natasha Mead, M.D.    Mm Digital Diagnostic Unilat R  11/20/2012  *RADIOLOGY REPORT*  Clinical Data:  Patient presents for a 87-month follow-up diagnostic right breast mammogram and ultrasound to evaluate a mass at the 6 o'clock position.  The patient also states new palpable abnormality in the right axilla.  DIGITAL DIAGNOSTIC RIGHT MAMMOGRAM WITH CAD AND RIGHT BREAST ULTRASOUND:  Comparison:  04/18/2012 and ultrasounds 07/22/2009, 08/31/2008 and 08/09/2008  Findings:  ACR Breast Density Category 2: There is a scattered fibroglandular pattern.  Exam demonstrates no change in mass of the upper outer right breast which was previously biopsied and found to be benign.  There is no significant change in a circumscribed ovoid mass over the lower central right breast.  There is no change in a 5 mm mass of the upper outer right breast.  There is a new 6 mm nodular density over the upper outer right breast in the posterior third.  Mammographic images were processed with CAD.  On physical exam, I palpate a superficial 3-4 mm nodular density of the right axilla likely dermal in origin.  Ultrasound is performed, showing a well defined ovoid hypoechoic focus with increased through transmission within the skin over the right axilla corresponding to patient's palpable abnormality and measuring 2 x 4 x 5 mm compatible with a sebaceous cyst or epidermal inclusion cyst.  Ultrasound over the  6  o'clock position 8 cm from the nipple again demonstrates a circumscribed ovoid hypoechoic mass with increased through transmission measuring 0.6 x 1.0 x 1.1 cm likely benign.   Ultrasound over the upper outer right breast 15 cm from the nipple demonstrates a  6 mm intramammary lymph node.  Ultrasound also at the 11 o'clock position 18 cm from the nipple demonstrates a small hypoechoic focus with echogenic halo measuring 7 x 8 x 8 mm likely corresponding to the new mammographic nodular density.  This may represent a focus of fat necrosis, although is indeterminate.  IMPRESSION: Stable probable benign mass at the 6 o'clock position of the right breast 8 cm from the nipple.  5 mm dermatologic lesion of the right axilla corresponding to patient's palpable abnormality.  New indeterminate  7 x 8 x 8 mm hypoechoic focus with echogenic halo over the 11 o'clock position of the right breast 18 cm from the nipple.  RECOMMENDATION: Recommend ultrasound-guided core biopsy of the indeterminate mass at the 11 o'clock position 18 cm from the nipple.  Also recommend continued 54-month follow-up diagnostic right breast mammogram and ultrasound of the probable benign mass at the 6 o'clock position. Recommend follow-up of patient's palpable dermatologic lesion on a clinical basis.  I have discussed the findings and recommendations with the patient. Results were also provided in writing at the conclusion of the visit.   BI-RADS CATEGORY 4:  Suspicious abnormality - biopsy should be considered.  Biopsy scheduled for 11/28/2012.   Original Report Authenticated By: Elberta Fortis, M.D.    US Breast Bx W Loc Dev 1st Lesion Img Bx Spec US Guide  12/08/2012  **ADDENDUM** CREATED: 12/01/2012 10:24:38  Pathology revealed fat necrosis in the right breast. This was found to be concordant by Dr. Cain Saupe. Pathology was relayed to the patient by telephone. The patient reported minimal tenderness at the biopsy site. Post biopsy instructions  were reviewed and her questions were answered. She was encouraged to call [facility] for any additional concerns. She was asked to return in July of 2014 for screening mammography.  Pathology results are dictated by Sonnie Alamo RN, BSN on December 01, 2012.  **END ADDENDUM** SIGNED BY: Harrel Lemon, M.D.   11/28/2012  *RADIOLOGY REPORT*  Clinical Data:  Suspicious right breast mass 11 o'clock location  ULTRASOUND GUIDED VACUUM ASSISTED CORE BIOPSY OF THE RIGHT BREAST  Comparison: Previous exams.  I met with the patient and we discussed the procedure of ultrasound- guided biopsy, including benefits and alternatives.  We discussed the high likelihood of a successful procedure. We discussed the risks of the procedure including infection, bleeding, tissue injury, clip migration, and inadequate sampling.  Informed written consent was given.  Using sterile technique, 2% lidocaine ultrasound guidance and a 12 gauge vacuum assisted needle biopsy was performed of the right breast mass 11 o'clock location using a lateral to medial approach.  At the conclusion of the procedure, a  wing shaped tissue marker clip was deployed into the biopsy cavity.  Follow-up 2-view mammogram was performed and dictated separately.  IMPRESSION:  Ultrasound-guided biopsy of right breast mass 11 o'clock location, with wing shaped clip placement.  Pathology is pending.  No apparent complications.  *RADIOLOGY REPORT*  Clinical Data:  Post ultrasound guided right breast biopsy 11 o'clock location  DIGITAL DIAGNOSTIC RIGHT MAMMOGRAM  Comparison:  Previous exams.  Findings:  Films are performed following ultrasound guided biopsy of right breast mass 11 o'clock location.  A wing shaped clip is appropriately located at the site of the biopsied mass.  IMPRESSION:  Appropriate wing shaped clip location, right breast 11 o'clock location biopsy site.  Original Report Authenticated By: Christiana Pellant, M.D.      Microbiology: No results found for this or any previous visit (from the past 240 hour(s)).   Labs: Basic Metabolic Panel:  Lab 12/05/12 1610  NA 139  K 3.4*  CL 101  CO2 28  GLUCOSE 117*  BUN 8  CREATININE 0.94  CALCIUM 9.5  MG --  PHOS --   Liver Function Tests: No results found for this basename: AST:5,ALT:5,ALKPHOS:5,BILITOT:5,PROT:5,ALBUMIN:5 in the last 168 hours No results found for this basename: LIPASE:5,AMYLASE:5 in the last 168 hours No results found for this basename: AMMONIA:5 in the last 168 hours CBC:  Lab 12/05/12 1941  WBC 5.7  NEUTROABS --  HGB 13.0  HCT 35.7*  MCV 82.8  PLT 235   Cardiac Enzymes:  Lab 12/05/12 1941  CKTOTAL --  CKMB --  CKMBINDEX --  TROPONINI <0.30   BNP: BNP (last 3 results)  Basename 12/26/11 1620  PROBNP 5.0   CBG:  Lab 12/08/12 0557 12/07/12 2133 12/07/12 1601 12/07/12 1105 12/07/12 0553  GLUCAP 186* 235* 214* 209* 150*    Signed:  Marinda Elk  [facility] 12/08/2012, 9:55 AM

## 2012-12-08 NOTE — Progress Notes (Signed)
PT has refused CPAP machine for tonight. PT says she has been resting good tonight with her nasal cannula and may try the CPAP again tomorrow night. RT will assist as needed.

## 2012-12-08 NOTE — Progress Notes (Signed)
Discharge instructions along with med list and scripts provided. IV d/c'd with catheter intact. Patient transported to main lobby via volunteer wheelchair. Belongings and patient O2 with patient. [name]

## 2012-12-15 ENCOUNTER — Other Ambulatory Visit: Payer: Self-pay | Admitting: Emergency Medicine

## 2012-12-16 ENCOUNTER — Telehealth: Payer: Self-pay | Admitting: Emergency Medicine

## 2012-12-16 NOTE — Telephone Encounter (Signed)
LMTCB x 1 for the pt  

## 2012-12-16 NOTE — Telephone Encounter (Signed)
I spoke with pt. She c/o cough w/ dark yellow thick phlem, sweats, chills, increase SOB (using oxygen 24/7), wheezing, ? Fever. She was d/c from hospital 12/08/12. Pt stated she finished abx and prednisone giving to her from the hospital. Offered appt to be seen today but refused stated she does not have transportation. She is requesting recs. Please advise RB thanks  No Known Allergies

## 2012-12-16 NOTE — Telephone Encounter (Signed)
LMOM TCB x1. Sick, will hold in triage.

## 2012-12-16 NOTE — Telephone Encounter (Signed)
She needs to be seen as soon as she can arrange transportation. Needs CXR, full eval

## 2012-12-16 NOTE — Telephone Encounter (Signed)
Pt returned call & can be reached at [phone].  I attempted to schedule an appt w/ the pt.  Pt refused all appointments stating that she is going to an funeral tomorrow & then states that she has to give so much notice to transportation to get here.  I offered to schedule an appt w/ either the NP or another doc later this week, pt again refused these appointments.  Would like to speak w/ the nurse.  Pt states she is afraid she will end up in ED like previously.  Antionette Fairy

## 2012-12-17 NOTE — Telephone Encounter (Signed)
Pt has been scheduled for an appt with TP on Fri., 12/19/12 @ 9:30. She will be back in town tonight but needs time to arrange for transportation. If the pt's sxs get any worse she will go to an Urgent Care or seek emergency help. Pt verbalized understanding.

## 2012-12-19 ENCOUNTER — Encounter: Payer: Self-pay | Admitting: Adult Health

## 2012-12-19 ENCOUNTER — Ambulatory Visit (INDEPENDENT_AMBULATORY_CARE_PROVIDER_SITE_OTHER): Payer: Medicaid Other | Admitting: Adult Health

## 2012-12-19 ENCOUNTER — Ambulatory Visit (INDEPENDENT_AMBULATORY_CARE_PROVIDER_SITE_OTHER)
Admission: RE | Admit: 2012-12-19 | Discharge: 2012-12-19 | Disposition: A | Discharge status: Home / Self Care | Payer: Medicaid Other | Source: Ambulatory Visit | Attending: Adult Health | Admitting: Adult Health

## 2012-12-19 VITALS — BP 118/70 | HR 107 | Temp 96.9°F | Ht 60.0 in | Wt 212.0 lb

## 2012-12-19 DIAGNOSIS — R490 Dysphonia: Secondary | ICD-10-CM

## 2012-12-19 DIAGNOSIS — J441 Chronic obstructive pulmonary disease with (acute) exacerbation: Secondary | ICD-10-CM

## 2012-12-19 NOTE — Patient Instructions (Addendum)
Continue on Symbicort, and Spiriva, make sure you brush rinse and gargle after inhaler use. Use Claritin daily for drainage. Work on stopping smoking. Continue on CPAP at nighttime. Clinical weight loss. Fair referring you to a ENT  specialist for your chronic hoarseness. Follow with Dr. Delton Coombes in 3-4 weeks and as needed

## 2012-12-19 NOTE — Progress Notes (Signed)
Subjective:    Patient ID: Stephanie Schultz, female    DOB: 09-04-1956, 57 y.o.   MRN: [id number]  HPI 57 yo smoker (90 pk-yrs, has cut down to about 2 cig a day), hx HTN, borderline DM, allergies. Dx with COPD at [facility] in [place]. She was started on O2 in ~2009. She had PFT within the last year. She remains fairly active as long as she wears her O2. She can walk about 30-40 feet, has to stop when shopping to rest. She has daily cough, productive of white phlegm. She has rare exacerbations, last was in Jan 2012.   ROV 07/05/11 -- COPD, OSA. Her data from [facility] showed she needs CPAP 10. We ordered O2 thru [facility]. She continues to smoke 3 cigs a day. She has been coughing more, having more sputum - yellow/white sputum.  She is having more dyspnea, more wheezing x 3 weeks. She has gained 30+ lbs since coming to [place]. She has not been wearing her O2 reliably.   ROV 09/20/11 -- COPD, OSA. Returns for f/u. Tells me that she has been rx for COPD exacerbation x 2 since our last visit. She tells me that her CXR showed "a spot on one of her lungs". Was done at [facility] ER on 08/23/11. She was treated with pred and azithro on each occasion. She is on Advair and Spiriva. Continues to have severe cough, costochondritic pain. The pred and azithro helped some but not completely. Ran out loratadine 2 weeks ago. Still on omeprazole bid. She is wearing CPAP 10 reliably.   ROV 10/25/11 -- COPD, OSA on CPAP 10. Last time we started [facility] in place of Advair to see if this helped w UA sx. Still on Spiriva. Restarted loratadine and continued omeprazole bid. She feels that her breathing is the same, cough is a bit better on the [facility]. No flares, but she is still having nasal gtt despite the loratadine.   12/17/2011 Acute OV  Pt presents for an acute office visit, complains of increased SOB, wheezing, prod cough with yellow-to-brown mucus  2 weeks . Went to ED on 1.23.13 and was given zpak and prednisone 50mg  x 4 days. CXR with  chronic changing. Has finished abx and steroids. Got some better but cough never went away. Still smoking 1/2 PPD (takes O2 off -goes outside to smoke) .  Remains on Dulera and Spiriva  With no missed doses.  OTC not helping.  Wants referral for home health.  Encouraged to quit smoking.   ROV 02/18/12 -- COPD, OSA on CPAP, chronic cough. Had an AE with residual cough as described above 2/13. Remains on Spiriva + Dulera. Remains on omeprazole bid, flonase, not using loratadine right now. Still smoking about 3 cig a day. She is interested in trying the nicotine patches  ROV 07/08/12 -- COPD, OSA on CPAP, chronic cough. She reports that she quit smoking July 3! She has gained 60 lbs over the last year. Her breathing is stable, still limited. She is wearing her O2 and her CPAP. Continues to take Spiriva + Dulera. She reports that she is still having GERD sx even on omeprazole bid. She ran out of flonase 1 month ago. She has had some epistaxis on R.   ROV 11/25/12 -- COPD, OSA on CPAP, chronic cough. Since last time she restarted smoking - currently at 5 cig a day. She ran out of her Elwin Sleight a week ago. Still taking spiriva. She has been having more cough over about  2 weeks. She has been using albuterol nebs bid - about to run out of it, need refill. She remains on loratadine, fluticasone, omeprazole. She hasn't been using CPAP regularly - needs new mask, this is being arranged. She is to undergo breast bx for a nodule this week.    12/19/2012 Post Hospital follow up  Patient returns for post hospital followup. Patient was admitted January 24-27th 2014 for COPD, exacerbation. She was treated with IV antibiotics, nebulized bronchodilators, and IV steroids.  She was discharged on a steroid taper, along with Levaquin.  Since discharge. She has had  some improvement however, continues to have productive cough, with some thick, yellow. Mucus. Unfortunately patient has restarted smoking. Smoking cessation education  was given. Chest x-ray today shows no acute process Patient denies any hemoptysis, orthopnea, PND, or leg swelling.     ROS:  Constitutional:   No  weight loss, night sweats,  Fevers, chills,  +fatigue, or  lassitude.  HEENT:   No headaches,  Difficulty swallowing,  Tooth/dental problems, or  Sore throat,                No sneezing, itching, ear ache,  +nasal congestion, post nasal drip,   CV:  No chest pain,  Orthopnea, PND, swelling in lower extremities, anasarca, dizziness, palpitations, syncope.   GI  No heartburn, indigestion, abdominal pain, nausea, vomiting, diarrhea, change in bowel habits, loss of appetite, bloody stools.   Resp:  No coughing up of blood.    No chest wall deformity  Skin: no rash or lesions.  GU: no dysuria, change in color of urine, no urgency or frequency.  No flank pain, no hematuria   MS:  No joint pain or swelling.  No decreased range of motion.  No back pain.  Psych:  No change in mood or affect. No depression or anxiety.  No memory loss.         Objective:   Physical Exam Filed Vitals:   12/19/12 0945  BP: 118/70  Pulse: 107  Temp: 96.9 F (36.1 C)   Gen: Pleasant, obese, in no distress,  normal affect  ENT: No lesions,  mouth clear,  oropharynx clear, no postnasal drip  Neck: No JVD, no TMG, no carotid bruits  Lungs: No use of accessory muscles, distant, coarse BS , no wheezes   Cardiovascular: RRR, heart sounds normal, no murmur or gallops, no peripheral edema  Musculoskeletal: No deformities, no cyanosis or clubbing  Neuro: alert, non focal  Skin: Warm, no lesions or rashes, old scars on B UE's   Assessment & Plan:  No problem-specific assessment & plan notes found for this encounter.

## 2012-12-22 ENCOUNTER — Encounter: Payer: Self-pay | Admitting: Adult Health

## 2012-12-22 DIAGNOSIS — R490 Dysphonia: Secondary | ICD-10-CM | POA: Insufficient documentation

## 2012-12-22 NOTE — Assessment & Plan Note (Signed)
Resolving exacerbation    Plan  Continue on Symbicort, and Spiriva, make sure you brush rinse and gargle after inhaler use. Use Claritin daily for drainage. Work on stopping smoking. Continue on CPAP at nighttime. Clinical weight loss. Fair referring you to a ENT  specialist for your chronic hoarseness. Follow with Dr. Delton Coombes in 3-4 weeks and as needed

## 2013-01-13 ENCOUNTER — Telehealth: Payer: Self-pay | Admitting: Emergency Medicine

## 2013-01-13 DIAGNOSIS — G4733 Obstructive sleep apnea (adult) (pediatric): Secondary | ICD-10-CM

## 2013-01-13 NOTE — Telephone Encounter (Signed)
lmomtcb on Stephanie Schultz's named VM

## 2013-01-13 NOTE — Telephone Encounter (Signed)
Spoke with [place] and she stated that they need a new order faxed in for this pts cpap for the settings.  RB please advise thanks  No Known Allergies

## 2013-01-14 ENCOUNTER — Ambulatory Visit: Payer: Medicaid Other | Admitting: Emergency Medicine

## 2013-01-14 NOTE — Telephone Encounter (Signed)
According to my last documentation she was on CPAP 10 cmH20. Not clear to me why they need a new order. I will put into EP[facility], but please clarify why they need this.

## 2013-01-15 ENCOUNTER — Inpatient Hospital Stay (HOSPITAL_COMMUNITY): Payer: Medicaid Other

## 2013-01-15 ENCOUNTER — Inpatient Hospital Stay (HOSPITAL_COMMUNITY)
Admission: EM | Admit: 2013-01-15 | Discharge: 2013-01-20 | DRG: 189 | Disposition: A | Discharge status: Home / Self Care | Payer: Medicaid Other | Attending: Internal Medicine | Admitting: Internal Medicine

## 2013-01-15 ENCOUNTER — Emergency Department (HOSPITAL_COMMUNITY): Payer: Medicaid Other

## 2013-01-15 ENCOUNTER — Encounter (HOSPITAL_COMMUNITY): Payer: Self-pay | Admitting: Emergency Medicine

## 2013-01-15 ENCOUNTER — Other Ambulatory Visit: Payer: Self-pay

## 2013-01-15 DIAGNOSIS — Z9981 Dependence on supplemental oxygen: Secondary | ICD-10-CM

## 2013-01-15 DIAGNOSIS — R911 Solitary pulmonary nodule: Secondary | ICD-10-CM

## 2013-01-15 DIAGNOSIS — R072 Precordial pain: Secondary | ICD-10-CM | POA: Diagnosis present

## 2013-01-15 DIAGNOSIS — D573 Sickle-cell trait: Secondary | ICD-10-CM | POA: Diagnosis present

## 2013-01-15 DIAGNOSIS — E119 Type 2 diabetes mellitus without complications: Secondary | ICD-10-CM | POA: Diagnosis present

## 2013-01-15 DIAGNOSIS — I509 Heart failure, unspecified: Secondary | ICD-10-CM | POA: Diagnosis present

## 2013-01-15 DIAGNOSIS — J309 Allergic rhinitis, unspecified: Secondary | ICD-10-CM

## 2013-01-15 DIAGNOSIS — J449 Chronic obstructive pulmonary disease, unspecified: Secondary | ICD-10-CM | POA: Diagnosis present

## 2013-01-15 DIAGNOSIS — J9620 Acute and chronic respiratory failure, unspecified whether with hypoxia or hypercapnia: Secondary | ICD-10-CM | POA: Diagnosis present

## 2013-01-15 DIAGNOSIS — L02219 Cutaneous abscess of trunk, unspecified: Secondary | ICD-10-CM | POA: Diagnosis present

## 2013-01-15 DIAGNOSIS — R040 Epistaxis: Secondary | ICD-10-CM

## 2013-01-15 DIAGNOSIS — R079 Chest pain, unspecified: Secondary | ICD-10-CM

## 2013-01-15 DIAGNOSIS — Z87891 Personal history of nicotine dependence: Secondary | ICD-10-CM | POA: Diagnosis present

## 2013-01-15 DIAGNOSIS — Z7982 Long term (current) use of aspirin: Secondary | ICD-10-CM

## 2013-01-15 DIAGNOSIS — D649 Anemia, unspecified: Secondary | ICD-10-CM | POA: Diagnosis present

## 2013-01-15 DIAGNOSIS — I2510 Atherosclerotic heart disease of native coronary artery without angina pectoris: Secondary | ICD-10-CM | POA: Diagnosis present

## 2013-01-15 DIAGNOSIS — J441 Chronic obstructive pulmonary disease with (acute) exacerbation: Secondary | ICD-10-CM

## 2013-01-15 DIAGNOSIS — I10 Essential (primary) hypertension: Secondary | ICD-10-CM | POA: Diagnosis present

## 2013-01-15 DIAGNOSIS — F17200 Nicotine dependence, unspecified, uncomplicated: Secondary | ICD-10-CM | POA: Diagnosis present

## 2013-01-15 DIAGNOSIS — R0602 Shortness of breath: Secondary | ICD-10-CM

## 2013-01-15 DIAGNOSIS — Z720 Tobacco use: Secondary | ICD-10-CM

## 2013-01-15 DIAGNOSIS — R490 Dysphonia: Secondary | ICD-10-CM

## 2013-01-15 DIAGNOSIS — Z79899 Other long term (current) drug therapy: Secondary | ICD-10-CM

## 2013-01-15 DIAGNOSIS — G4733 Obstructive sleep apnea (adult) (pediatric): Secondary | ICD-10-CM | POA: Diagnosis present

## 2013-01-15 DIAGNOSIS — D72829 Elevated white blood cell count, unspecified: Secondary | ICD-10-CM | POA: Diagnosis present

## 2013-01-15 DIAGNOSIS — N764 Abscess of vulva: Secondary | ICD-10-CM | POA: Diagnosis present

## 2013-01-15 DIAGNOSIS — I5031 Acute diastolic (congestive) heart failure: Secondary | ICD-10-CM | POA: Diagnosis present

## 2013-01-15 DIAGNOSIS — R9389 Abnormal findings on diagnostic imaging of other specified body structures: Secondary | ICD-10-CM

## 2013-01-15 DIAGNOSIS — K219 Gastro-esophageal reflux disease without esophagitis: Secondary | ICD-10-CM | POA: Diagnosis present

## 2013-01-15 HISTORY — DX: Morbid (severe) obesity due to excess calories: E66.01

## 2013-01-15 HISTORY — DX: Sickle-cell trait: D57.3

## 2013-01-15 HISTORY — DX: Tobacco use: Z72.0

## 2013-01-15 LAB — URINALYSIS, MICROSCOPIC ONLY
Glucose, UA: NEGATIVE mg/dL
Hgb urine dipstick: NEGATIVE
Protein, ur: NEGATIVE mg/dL
pH: 5.5 (ref 5.0–8.0)

## 2013-01-15 LAB — GLUCOSE, CAPILLARY
Glucose-Capillary: 212 mg/dL — ABNORMAL HIGH (ref 70–99)
Glucose-Capillary: 137 mg/dL — ABNORMAL HIGH (ref 70–99)

## 2013-01-15 LAB — CBC WITH DIFFERENTIAL/PLATELET
Basophils Absolute: 0 10*3/uL (ref 0.0–0.1)
Basophils Relative: 0 % (ref 0–1)
Hemoglobin: 11.4 g/dL — ABNORMAL LOW (ref 12.0–15.0)
MCHC: 36.3 g/dL — ABNORMAL HIGH (ref 30.0–36.0)
Neutro Abs: 6.5 10*3/uL (ref 1.7–7.7)
Neutrophils Relative %: 84 % — ABNORMAL HIGH (ref 43–77)
RDW: 15 % (ref 11.5–15.5)

## 2013-01-15 LAB — COMPREHENSIVE METABOLIC PANEL
AST: 188 U/L — ABNORMAL HIGH (ref 0–37)
Albumin: 3.5 g/dL (ref 3.5–5.2)
Alkaline Phosphatase: 43 U/L (ref 39–117)
Chloride: 106 mEq/L (ref 96–112)
Potassium: 3.7 mEq/L (ref 3.5–5.1)
Sodium: 140 mEq/L (ref 135–145)
Total Bilirubin: 0.3 mg/dL (ref 0.3–1.2)

## 2013-01-15 LAB — POCT I-STAT, CHEM 8
BUN: 10 mg/dL (ref 6–23)
Calcium, Ion: 1.18 mmol/L (ref 1.12–1.23)
Chloride: 107 mEq/L (ref 96–112)
Creatinine, Ser: 0.8 mg/dL (ref 0.50–1.10)
Glucose, Bld: 134 mg/dL — ABNORMAL HIGH (ref 70–99)

## 2013-01-15 MED ORDER — TIOTROPIUM BROMIDE MONOHYDRATE 18 MCG IN CAPS
18.0000 ug | ORAL_CAPSULE | Freq: Every day | RESPIRATORY_TRACT | Status: DC
  Administered 2013-01-15 – 2013-01-20 (×6): 18 ug via RESPIRATORY_TRACT
  Filled 2013-01-15 (×2): qty 5

## 2013-01-15 MED ORDER — SODIUM CHLORIDE 0.9 % IJ SOLN
3.0000 mL | Freq: Two times a day (BID) | INTRAMUSCULAR | Status: DC
  Administered 2013-01-16 – 2013-01-20 (×5): 3 mL via INTRAVENOUS

## 2013-01-15 MED ORDER — PANTOPRAZOLE SODIUM 40 MG PO TBEC: 40.0000 mg | DELAYED_RELEASE_TABLET | Freq: Every day | ORAL | Status: DC

## 2013-01-15 MED ORDER — ACETAMINOPHEN 325 MG PO TABS
650.0000 mg | ORAL_TABLET | Freq: Once | ORAL | Status: AC
  Administered 2013-01-15: 650 mg via ORAL
  Filled 2013-01-15: qty 2

## 2013-01-15 MED ORDER — INSULIN ASPART 100 UNIT/ML ~~LOC~~ SOLN
0.0000 [IU] | Freq: Three times a day (TID) | SUBCUTANEOUS | Status: DC
  Administered 2013-01-15: 5 [IU] via SUBCUTANEOUS
  Administered 2013-01-15: 2 [IU] via SUBCUTANEOUS
  Administered 2013-01-16 (×2): 3 [IU] via SUBCUTANEOUS
  Administered 2013-01-17: 8 [IU] via SUBCUTANEOUS
  Administered 2013-01-17: 2 [IU] via SUBCUTANEOUS
  Administered 2013-01-17: 3 [IU] via SUBCUTANEOUS
  Administered 2013-01-18: 5 [IU] via SUBCUTANEOUS
  Administered 2013-01-18: 8 [IU] via SUBCUTANEOUS
  Administered 2013-01-18: 11 [IU] via SUBCUTANEOUS
  Administered 2013-01-19: 5 [IU] via SUBCUTANEOUS
  Administered 2013-01-19: 11 [IU] via SUBCUTANEOUS
  Administered 2013-01-19: 3 [IU] via SUBCUTANEOUS

## 2013-01-15 MED ORDER — ASPIRIN EC 81 MG PO TBEC
81.0000 mg | DELAYED_RELEASE_TABLET | Freq: Every day | ORAL | Status: DC
  Administered 2013-01-15 – 2013-01-20 (×6): 81 mg via ORAL
  Filled 2013-01-15 (×6): qty 1

## 2013-01-15 MED ORDER — MORPHINE SULFATE 2 MG/ML IJ SOLN
2.0000 mg | INTRAMUSCULAR | Status: DC | PRN
  Filled 2013-01-15: qty 1

## 2013-01-15 MED ORDER — SODIUM CHLORIDE 0.9 % IJ SOLN
3.0000 mL | Freq: Two times a day (BID) | INTRAMUSCULAR | Status: DC
  Administered 2013-01-15 – 2013-01-20 (×9): 3 mL via INTRAVENOUS

## 2013-01-15 MED ORDER — SODIUM CHLORIDE 0.9 % IJ SOLN
3.0000 mL | INTRAMUSCULAR | Status: DC | PRN
  Administered 2013-01-18: 3 mL via INTRAVENOUS

## 2013-01-15 MED ORDER — PANTOPRAZOLE SODIUM 40 MG PO TBEC
40.0000 mg | DELAYED_RELEASE_TABLET | Freq: Every day | ORAL | Status: DC
  Administered 2013-01-15 – 2013-01-20 (×6): 40 mg via ORAL
  Filled 2013-01-15 (×6): qty 1

## 2013-01-15 MED ORDER — DEXTROSE 5 % IV SOLN
500.0000 mg | INTRAVENOUS | Status: DC
  Administered 2013-01-15: 500 mg via INTRAVENOUS
  Filled 2013-01-15 (×2): qty 500

## 2013-01-15 MED ORDER — SODIUM CHLORIDE 0.9 % IV SOLN: 250.0000 mL | INTRAVENOUS | Status: DC | PRN

## 2013-01-15 MED ORDER — SALINE SPRAY 0.65 % NA SOLN
1.0000 | NASAL | Status: DC | PRN
  Filled 2013-01-15: qty 44

## 2013-01-15 MED ORDER — IPRATROPIUM BROMIDE 0.02 % IN SOLN
0.5000 mg | Freq: Once | RESPIRATORY_TRACT | Status: AC
  Administered 2013-01-15: 0.5 mg via RESPIRATORY_TRACT
  Filled 2013-01-15: qty 2.5

## 2013-01-15 MED ORDER — ALBUTEROL SULFATE (5 MG/ML) 0.5% IN NEBU
5.0000 mg | INHALATION_SOLUTION | Freq: Once | RESPIRATORY_TRACT | Status: AC
  Administered 2013-01-15: 5 mg via RESPIRATORY_TRACT
  Filled 2013-01-15: qty 40

## 2013-01-15 MED ORDER — PREDNISONE 20 MG PO TABS
60.0000 mg | ORAL_TABLET | Freq: Once | ORAL | Status: DC
  Filled 2013-01-15: qty 3

## 2013-01-15 MED ORDER — FUROSEMIDE 10 MG/ML IJ SOLN
20.0000 mg | Freq: Once | INTRAMUSCULAR | Status: AC
  Administered 2013-01-15: 20 mg via INTRAVENOUS
  Filled 2013-01-15: qty 2

## 2013-01-15 MED ORDER — TRAMADOL HCL 50 MG PO TABS
50.0000 mg | ORAL_TABLET | Freq: Four times a day (QID) | ORAL | Status: DC | PRN
  Administered 2013-01-15 – 2013-01-20 (×6): 50 mg via ORAL
  Filled 2013-01-15 (×5): qty 1

## 2013-01-15 MED ORDER — FLUTICASONE PROPIONATE 50 MCG/ACT NA SUSP
2.0000 | Freq: Two times a day (BID) | NASAL | Status: DC
  Administered 2013-01-15 – 2013-01-20 (×10): 2 via NASAL
  Filled 2013-01-15 (×2): qty 16

## 2013-01-15 MED ORDER — LORATADINE 10 MG PO TABS
10.0000 mg | ORAL_TABLET | Freq: Every day | ORAL | Status: DC
  Administered 2013-01-15 – 2013-01-20 (×6): 10 mg via ORAL
  Filled 2013-01-15 (×6): qty 1

## 2013-01-15 MED ORDER — POTASSIUM CHLORIDE CRYS ER 20 MEQ PO TBCR
20.0000 meq | EXTENDED_RELEASE_TABLET | Freq: Two times a day (BID) | ORAL | Status: DC
  Administered 2013-01-15 – 2013-01-20 (×11): 20 meq via ORAL
  Filled 2013-01-15 (×13): qty 1

## 2013-01-15 MED ORDER — ALBUTEROL SULFATE (5 MG/ML) 0.5% IN NEBU
2.5000 mg | INHALATION_SOLUTION | Freq: Four times a day (QID) | RESPIRATORY_TRACT | Status: DC
  Administered 2013-01-15 – 2013-01-19 (×19): 2.5 mg via RESPIRATORY_TRACT
  Filled 2013-01-15 (×19): qty 0.5

## 2013-01-15 MED ORDER — TRIAMTERENE-HCTZ 37.5-25 MG PO TABS
1.0000 | ORAL_TABLET | Freq: Every day | ORAL | Status: DC
  Administered 2013-01-15 – 2013-01-20 (×6): 1 via ORAL
  Filled 2013-01-15 (×6): qty 1

## 2013-01-15 MED ORDER — ENOXAPARIN SODIUM 40 MG/0.4ML ~~LOC~~ SOLN
40.0000 mg | SUBCUTANEOUS | Status: DC
  Administered 2013-01-15 – 2013-01-20 (×6): 40 mg via SUBCUTANEOUS
  Filled 2013-01-15 (×6): qty 0.4

## 2013-01-15 MED ORDER — INSULIN ASPART 100 UNIT/ML ~~LOC~~ SOLN
0.0000 [IU] | Freq: Every day | SUBCUTANEOUS | Status: DC
  Administered 2013-01-17: 3 [IU] via SUBCUTANEOUS
  Administered 2013-01-18: 5 [IU] via SUBCUTANEOUS

## 2013-01-15 MED ORDER — ONDANSETRON HCL 4 MG PO TABS
4.0000 mg | ORAL_TABLET | Freq: Four times a day (QID) | ORAL | Status: DC | PRN
  Administered 2013-01-15: 4 mg via ORAL
  Filled 2013-01-15: qty 1

## 2013-01-15 MED ORDER — ALBUTEROL SULFATE (5 MG/ML) 0.5% IN NEBU: 2.5000 mg | INHALATION_SOLUTION | RESPIRATORY_TRACT | Status: DC | PRN

## 2013-01-15 MED ORDER — PREDNISONE 20 MG PO TABS
40.0000 mg | ORAL_TABLET | Freq: Every day | ORAL | Status: DC
  Administered 2013-01-16: 40 mg via ORAL
  Filled 2013-01-15 (×2): qty 2

## 2013-01-15 MED ORDER — ONDANSETRON HCL 4 MG/2ML IJ SOLN: 4.0000 mg | Freq: Four times a day (QID) | INTRAMUSCULAR | Status: DC | PRN

## 2013-01-15 MED ORDER — FUROSEMIDE 10 MG/ML IJ SOLN
20.0000 mg | Freq: Every day | INTRAMUSCULAR | Status: DC
  Administered 2013-01-15 – 2013-01-19 (×5): 20 mg via INTRAVENOUS
  Filled 2013-01-15 (×4): qty 2

## 2013-01-15 MED ORDER — BUDESONIDE-FORMOTEROL FUMARATE 160-4.5 MCG/ACT IN AERO
2.0000 | INHALATION_SPRAY | Freq: Two times a day (BID) | RESPIRATORY_TRACT | Status: DC
  Administered 2013-01-15 – 2013-01-20 (×11): 2 via RESPIRATORY_TRACT
  Filled 2013-01-15: qty 6

## 2013-01-15 MED ORDER — ACETAMINOPHEN 500 MG PO TABS: 1000.0000 mg | ORAL_TABLET | ORAL | Status: DC | PRN

## 2013-01-15 MED ORDER — AMITRIPTYLINE HCL 100 MG PO TABS
100.0000 mg | ORAL_TABLET | Freq: Every day | ORAL | Status: DC
  Administered 2013-01-15 – 2013-01-19 (×5): 100 mg via ORAL
  Filled 2013-01-15 (×6): qty 1

## 2013-01-15 MED ORDER — SALINE NASAL SPRAY 0.65 % NA SOLN: 1.0000 | NASAL | Status: DC | PRN

## 2013-01-15 MED ORDER — AZITHROMYCIN 500 MG PO TABS
500.0000 mg | ORAL_TABLET | Freq: Every day | ORAL | Status: DC
  Administered 2013-01-16 – 2013-01-20 (×5): 500 mg via ORAL
  Filled 2013-01-15 (×5): qty 1

## 2013-01-15 MED ORDER — DOCUSATE SODIUM 100 MG PO CAPS
100.0000 mg | ORAL_CAPSULE | Freq: Two times a day (BID) | ORAL | Status: DC
  Administered 2013-01-15 – 2013-01-20 (×11): 100 mg via ORAL
  Filled 2013-01-15 (×12): qty 1

## 2013-01-15 NOTE — ED Provider Notes (Signed)
History     CSN: [id number]  Arrival date & time 01/15/13  0101   None     Chief Complaint  Patient presents with  . Shortness of Breath    (Consider location/radiation/quality/duration/timing/severity/associated sxs/prior treatment) The history is provided by a caregiver.   History provided by pt.   Pt w/ h/o emphysema, on 2L home O2, presents w/ progressively worsening exertional SOB x 3 days.  Has difficulty moving from her bed to bathroom now, unable to lay flat and has had to increase her O2.  No relief w/ nebs.  Associated w/ productive cough, tactile fever and left and right lateral rib cage pain.  Denies CP and LE edema.  Per prior chart, most recent admission in 11/2012 for COPD exacerbation.  No h/o CHF.  Has multiple other complaints as well, including headache, 2 weeks of abdominal pain, episode of vomiting 3 days ago, diarrhea, rectal boils and 2 weeks vaginal pruritis.   Past Medical History  Diagnosis Date  . Borderline diabetes   . Hypertension   . Anxiety   . Emphysema   . COPD (chronic obstructive pulmonary disease)   . OSA on CPAP     CPAP at night   . Dizziness     pt believes this is motion sickness or vertigo  . GERD (gastroesophageal reflux disease)   . Headache   . Cancer 1990    cervical   . Anemia     Past Surgical History  Procedure Laterality Date  . Tubal ligation    . Cardiac catheterization    . Colonoscopy  09/05/2012    Procedure: COLONOSCOPY;  Surgeon: Theda Belfast, MD;  Location: [facility];  Service: Endoscopy;  Laterality: N/A;    Family History  Problem Relation Age of Onset  . Lung cancer Paternal Aunt   . Lung cancer Paternal Grandfather     History  Substance Use Topics  . Smoking status: Current Every Day Smoker -- 1.00 packs/day for 36 years    Types: Cigarettes    Last Attempt to Quit: 05/14/2012  . Smokeless tobacco: Never Used     Comment: Approx 90 pk-yrs. Pt states smoking 5 cigs per day.  . Alcohol Use: No     OB History   Grav Para Term Preterm Abortions TAB SAB Ect Mult Living                  Review of Systems  All other systems reviewed and are negative.    Allergies  Review of patient's allergies indicates no known allergies.  Home Medications   Current Outpatient Rx  Name  Route  Sig  Dispense  Refill  . acetaminophen (TYLENOL) 500 MG tablet   Oral   Take 1,000 mg by mouth daily as needed. For pain         . albuterol (PROVENTIL HFA;VENTOLIN HFA) 108 (90 BASE) MCG/ACT inhaler   Inhalation   Inhale 2 puffs into the lungs every 4 (four) hours as needed for wheezing.   1 Inhaler   3   . albuterol (PROVENTIL) (2.5 MG/3ML) 0.083% nebulizer solution   Nebulization   Take 2.5 mg by nebulization 4 (four) times daily as needed. DX: 496. For wheezing         . amitriptyline (ELAVIL) 50 MG tablet   Oral   Take 2 tablets (100 mg total) by mouth at bedtime. Further refills will need to come from her PCP   60 tablet   0   .  aspirin EC 81 MG tablet   Oral   Take 81 mg by mouth daily.         . budesonide-formoterol (SYMBICORT) 160-4.5 MCG/ACT inhaler   Inhalation   Inhale 2 puffs into the lungs 2 (two) times daily.   1 Inhaler   3   . Cyanocobalamin (VITAMIN B-12) 500 MCG SUBL   Sublingual   Place 1 tablet under the tongue daily.         . fluticasone (FLONASE) 50 MCG/ACT nasal spray   Nasal   Place 2 sprays into the nose 2 (two) times daily.   16 g   11   . levofloxacin (LEVAQUIN) 750 MG tablet   Oral   Take 1 tablet (750 mg total) by mouth daily.   5 tablet   0   . loratadine (CLARITIN) 10 MG tablet      take 1 tablet by mouth once daily   30 tablet   5   . omeprazole (PRILOSEC) 20 MG capsule   Oral   Take 20 mg by mouth 2 (two) times daily.         Marland Kitchen omeprazole (PRILOSEC) 20 MG capsule      take 1 capsule by mouth twice a day   60 capsule   3   . potassium chloride SA (K-DUR,KLOR-CON) 20 MEQ tablet   Oral   Take 20 mEq by mouth 2  (two) times daily.         . predniSONE (DELTASONE) 10 MG tablet      Takes 6 tablets for 1 days, then 5 tablets for 1 days, then 4 tablets for 1 days, then 3 tablets for 1 days, then 2 tabs for 1 days, then 1 tab for 1 days, and then stop.   21 tablet   0   . sodium chloride (OCEAN) 0.65 % nasal spray   Nasal   Place 1 spray into the nose at bedtime as needed. For nasal congestion         . SPIRIVA HANDIHALER 18 MCG inhalation capsule      inhale contents of 1 capsule by mouth once daily   30 each   5   . traMADol (ULTRAM) 50 MG tablet   Oral   Take 50 mg by mouth every 6 (six) hours as needed. pain         . triamterene-hydrochlorothiazide (DYAZIDE) 37.5-25 MG per capsule      take 1 capsule by mouth once daily   30 capsule   3     BP 138/69  Pulse 97  Temp(Src) 97.9 F (36.6 C) (Oral)  Resp 24  SpO2 97%  Physical Exam  Nursing note and vitals reviewed. Constitutional: She is oriented to person, place, and time. She appears well-developed and well-nourished. No distress.  obese  HENT:  Head: Normocephalic and atraumatic.  Eyes:  Normal appearance  Neck: Normal range of motion.  Cardiovascular: Normal rate and regular rhythm.   Pulmonary/Chest: Effort normal and breath sounds normal. No respiratory distress.  Mild dyspnea but able to complete sentences at rest.  Worsens when she stands at bedside and leans back in bed.  Expiratory wheezing at lung bases.  Shallow respirations.   Abdominal: Soft. Bowel sounds are normal. She exhibits no distension.  Obese. Mild RUQ and epigastric ttp.    Genitourinary:  External genitalia appears nml but exam limited because patient unable to lay back d/t respiratory distress.  2cm firm, tender subq mass of left  lower medial buttock w/out overlying skin changes.   Musculoskeletal: Normal range of motion.  No LE edema  Neurological: She is alert and oriented to person, place, and time.  Skin: Skin is warm and dry. No rash  noted.  Pt appears mildly jaundiced    Psychiatric: She has a normal mood and affect. Her behavior is normal.    ED Course  Procedures (including critical care time)   Date: 01/15/2013  Rate: 86  Rhythm: normal sinus rhythm  QRS Axis: normal  Intervals: normal  ST/T Wave abnormalities: normal  Conduction Disutrbances:none  Narrative Interpretation:   Old EKG Reviewed: no sig changes   Labs Reviewed  CBC WITH DIFFERENTIAL - Abnormal; Notable for the following:    RBC 3.81 (*)    Hemoglobin 11.4 (*)    HCT 31.4 (*)    MCHC 36.3 (*)    Neutrophils Relative 84 (*)    Monocytes Relative 2 (*)    All other components within normal limits  COMPREHENSIVE METABOLIC PANEL - Abnormal; Notable for the following:    Glucose, Bld 137 (*)    AST 188 (*)    ALT 210 (*)    GFR calc non Af Amer 76 (*)    GFR calc Af Amer 88 (*)    All other components within normal limits  URINALYSIS, MICROSCOPIC ONLY - Abnormal; Notable for the following:    Bacteria, UA FEW (*)    All other components within normal limits  GLUCOSE, CAPILLARY - Abnormal; Notable for the following:    Glucose-Capillary 140 (*)    All other components within normal limits  GLUCOSE, CAPILLARY - Abnormal; Notable for the following:    Glucose-Capillary 212 (*)    All other components within normal limits  POCT I-STAT, CHEM 8 - Abnormal; Notable for the following:    Glucose, Bld 134 (*)    Hemoglobin 11.2 (*)    HCT 33.0 (*)    All other components within normal limits  TSH  HEPATITIS PANEL, ACUTE  POCT I-STAT TROPONIN I   Dg Chest 2 View  01/15/2013  *RADIOLOGY REPORT*  Clinical Data: Sudden onset of shortness of breath.  CHEST - 2 VIEW  Comparison: Chest radiograph performed 12/19/2012  Findings: The lungs are well-aerated.  Vascular congestion is noted, with mild bibasilar opacities, raising concern for mild interstitial edema.  Trace fluid is seen tracking along the right minor fissure.  No significant pleural  effusion or pneumothorax is seen.  The heart is borderline normal in size.  No acute osseous abnormalities are seen.  IMPRESSION: Vascular congestion, with mild bibasilar opacities, raising concern for mild interstitial edema.   Original Report Authenticated By: Tonia Ghent, M.D.      1. Shortness of breath   2. COPD with exacerbation   3. Diabetes mellitus   4. Obstructive sleep apnea       MDM  56yo F w/ h/o emphysema on home O2 presents w/ progressive dyspnea w/ increased oxygen demands, as well as several other complaints. Afebrile, respiratory distress, particularly w/ movement and leaning back, and diffuse expiratory wheezing on exam.  CXR shows vascular congestion and possible mild interstitial edema.  No relief of sx w/ albuterol neb (received IV solumedrol en route).  20mg  IV lasix ordered and [facility] consulted for admission.         Otilio Miu, PA-C 01/15/13 1421  Otilio Miu, PA-C 01/15/13 1424

## 2013-01-15 NOTE — Progress Notes (Signed)
[facility] PROGRESS NOTE  Stephanie Schultz WUJ:[id number] DOB: 17-Jun-1956 DOA: 01/15/2013 PCP: Dorrene German, MD  Brief narrative 57 y.o. female with hx of severe COPD and asthma, one home oxygen of 2 L [place], obesity, sleep apnea on 10cm CPAP under the care of Dr Delton Coombes, HTN, GERD, presents to the ER with 3 days hx of increase white sputum productive cough, increase shortness of breath and wheezing, unrelieved with nebulizer at home. She denied any fever or chills, chest pain only with coughing. Evaluation in the ER included a CXR although without infiltrate, does have interstitial edema, a normal WBC, normal renal fx tests. She was given several nebs, IV solumedrol in the ambulance, hospitalist was asked to admit her for COPD exacerbation. I noted she has been on PO levoquin. She also has vague and mild abdominal discomfort. There has been no nausea or vomiting. She was noted to have elevated LFTs but not alkaline phosphatase.    Assessment/Plan: 1. Acute on chronic respiratory failure: Precipitated by COPD exacerbation and possible acute diastolic CHF. Improving. Continue treatment for underlying causes. Continue oxygen 2. Acute exacerbation of COPD: Continue prednisone, IV azithromycin and bronchodilator nebulization's 3. Possible acute diastolic CHF: Follow 2-D echo. Continue IV Lasix. 4. Type II DM: Continue SSI. Fluctuating control. Monitor 5. Hypertension: Reasonably controlled 6. OSA: Continue nightly CPAP 7. Abdominal pain: Seems to have resolved. Mild transaminitis. Followup a.m. LFTs.? Fatty liver on abdominal ultrasound. 8. Anemia: Possibly chronic. Follow CBC in a.m.  Code Status: Full Family Communication: Discussed with patient. Disposition Plan: Home when medically stable.   Consultants:  None   Procedures:  None  Antibiotics:  IV azithromycin 3/6 >   HPI/Subjective: Indicates that dyspnea is better. Denies chest pain. Persisting mostly dry cough. Denies  abdominal pain. Wants Foley catheter removed.  Objective: Filed Vitals:   01/15/13 0600 01/15/13 0645 01/15/13 0926 01/15/13 1300  BP: 135/73 162/69  143/55  Pulse: 91 92  90  Temp:  97.6 F (36.4 C)  97.6 F (36.4 C)  TempSrc:  Oral  Oral  Resp: 22 24  20   Height:  5\' 1"  (1.549 m)    Weight:  96.4 kg (212 lb 8.4 oz)    SpO2: 95% 94% 95% 95%    Intake/Output Summary (Last 24 hours) at 01/15/13 1716 Last data filed at 01/15/13 1450  Gross per 24 hour  Intake    480 ml  Output   1750 ml  Net  -1270 ml   Filed Weights   01/15/13 0645  Weight: 96.4 kg (212 lb 8.4 oz)    Exam:   General exam: Comfortable. Morbidly obese  Respiratory system: Reduced breath sounds bilaterally with occasional bilateral expiratory rhonchi. No increased work of breathing. Able to speak in full sentences.  Cardiovascular system: S1 & S2 heard, RRR. No JVD, murmurs, gallops, clicks but has trace bilateral leg edema. Telemetry shows sinus tachycardia in the 110s.  Gastrointestinal system: Abdomen is nondistended, soft and nontender. Normal bowel sounds heard.  Central nervous system: Alert and oriented. No focal neurological deficits.  Extremities: Symmetric 5 x 5 power.   Data Reviewed: Basic Metabolic Panel:  Recent Labs Lab 01/15/13 0245 01/15/13 0305  NA 140 144  K 3.7 3.8  CL 106 107  CO2 26  --   GLUCOSE 137* 134*  BUN 11 10  CREATININE 0.85 0.80  CALCIUM 9.2  --    Liver Function Tests:  Recent Labs Lab 01/15/13 0245  AST 188*  ALT 210*  ALKPHOS 43  BILITOT 0.3  PROT 7.4  ALBUMIN 3.5   No results found for this basename: LIPASE, AMYLASE,  in the last 168 hours No results found for this basename: AMMONIA,  in the last 168 hours CBC:  Recent Labs Lab 01/15/13 0245 01/15/13 0305  WBC 7.7  --   NEUTROABS 6.5  --   HGB 11.4* 11.2*  HCT 31.4* 33.0*  MCV 82.4  --   PLT 199  --    Cardiac Enzymes: No results found for this basename: CKTOTAL, CKMB, CKMBINDEX,  TROPONINI,  in the last 168 hours BNP (last 3 results) No results found for this basename: PROBNP,  in the last 8760 hours CBG:  Recent Labs Lab 01/15/13 0805 01/15/13 1117 01/15/13 1630  GLUCAP 140* 212* 134*    No results found for this or any previous visit (from the past 240 hour(s)).   Studies: Dg Chest 2 View  01/15/2013  *RADIOLOGY REPORT*  Clinical Data: Sudden onset of shortness of breath.  CHEST - 2 VIEW  Comparison: Chest radiograph performed 12/19/2012  Findings: The lungs are well-aerated.  Vascular congestion is noted, with mild bibasilar opacities, raising concern for mild interstitial edema.  Trace fluid is seen tracking along the right minor fissure.  No significant pleural effusion or pneumothorax is seen.  The heart is borderline normal in size.  No acute osseous abnormalities are seen.  IMPRESSION: Vascular congestion, with mild bibasilar opacities, raising concern for mild interstitial edema.   Original Report Authenticated By: Tonia Ghent, M.D.    US Abdomen Complete  01/15/2013  *RADIOLOGY REPORT*  Clinical Data:  Elevated LFTs, history hypertension, COPD, GERD, cancer smoking, borderline diabetes  ULTRASOUND ABDOMEN:  Technique:  Sonography of upper abdominal structures was performed.  Comparison:  None  Gallbladder:  Normally distended without stones or wall thickening. No pericholecystic fluid or sonographic Murphy sign.  Common bile duct:  Normal caliber 4 mm diameter  Liver:  Echogenic, likely fatty infiltration, though this can be seen with cirrhosis and certain infiltrative disorders.  No gross hepatic mass or nodularity, though intrahepatic visualization is severely limited by poor sound transmission through echogenic parenchyma.  Hepatopetal portal venous flow.  IVC:  Normal appearance  Pancreas:  Small portion of pancreatic body normal appearance, remainder obscured by combination of bowel gas, increased hepatic echogenicity and body habitus.  Spleen:  Normal  morphology, 8.0 cm length  Right kidney:  11.0 cm length. Normal morphology without mass or hydronephrosis.  Left kidney:  10.5 cm length. Normal morphology without mass or hydronephrosis.  Aorta:  Significant portions obscured by bowel gas.  Proximally normal caliber.  Other:  No free fluid  IMPRESSION: Incomplete visualization of pancreas and liver as above. Probable fatty infiltration of liver. If better intrahepatic visualization is required, recommend CT or MR imaging with contrast.   Original Report Authenticated By: Ulyses Southward, M.D.      Additional labs:   Scheduled Meds: . albuterol  2.5 mg Nebulization Q6H  . amitriptyline  100 mg Oral QHS  . aspirin EC  81 mg Oral Daily  . azithromycin  500 mg Intravenous Q24H  . budesonide-formoterol  2 puff Inhalation BID  . docusate sodium  100 mg Oral BID  . enoxaparin (LOVENOX) injection  40 mg Subcutaneous Q24H  . fluticasone  2 spray Each Nare BID  . furosemide  20 mg Intravenous Daily  . insulin aspart  0-15 Units Subcutaneous TID WC  . insulin aspart  0-5 Units Subcutaneous QHS  .  loratadine  10 mg Oral Daily  . pantoprazole  40 mg Oral Daily  . potassium chloride SA  20 mEq Oral BID  . [START ON 01/16/2013] predniSONE  40 mg Oral QAC breakfast  . sodium chloride  3 mL Intravenous Q12H  . sodium chloride  3 mL Intravenous Q12H  . tiotropium  18 mcg Inhalation Daily  . triamterene-hydrochlorothiazide  1 each Oral Daily   Continuous Infusions:   Active Problems:   COPD with exacerbation   Diabetes mellitus   Hypertension   Obstructive sleep apnea    Time spent: 35 minutes    [facility]  [facility] Pager [phone].   If 8PM-8AM, please contact night-coverage at [email or web address], password [email or web address] 01/15/2013, 5:16 PM  LOS: 0 days

## 2013-01-15 NOTE — H&P (Signed)
[facility] History and Physical  Stephanie Schultz ZOX:[id number] DOB: 11-06-1956    PCP:   Dorrene German, MD   Chief Complaint: shortness of breath with exertion.  HPI: Stephanie Schultz is an 57 y.o. female  with hx of severe COPD and asthma, one home oxygen of 2 L [place], obesity, sleep apnea on 10cm CPAP under the care of Dr Delton Coombes,  HTN, GERD, presents to the ER with 3 days hx of increase white sputum productive cough, increase shortness of breath and wheezing, unrelieved with nebulizer at home. She denied any fever or chills, chest pain only with coughing. Evaluation in the ER included a CXR although without infiltrate, does have interstitial edema, a normal WBC, normal renal fx tests.  She was given several nebs, IV solumedrol in the ambulance, hospitalist was asked to admit her for COPD exacerbation.  I noted she has been on PO levoquin.  She also has vague and mild abdominal discomfort.  There has been no nausea or vomiting.   She was noted to have elevated LFTs but not alkaline phosphatase.     Rewiew of Systems:  Constitutional: Negative for malaise, fever and chills. No significant weight loss or weight gain Eyes: Negative for eye pain, redness and discharge, diplopia, visual changes, or flashes of light. ENMT: Negative for ear pain, hoarseness, nasal congestion, sinus pressure and sore throat. No headaches; tinnitus, drooling, or problem swallowing. Cardiovascular: Negative for chest pain, palpitations, diaphoresis,and peripheral edema. ; No orthopnea, PND Respiratory: Negative for cough, hemoptysis,  and stridor. No pleuritic chestpain. Gastrointestinal: Negative for nausea, vomiting, diarrhea, constipation, melena, blood in stool, hematemesis, jaundice and rectal bleeding.    Genitourinary: Negative for frequency, dysuria, incontinence,flank pain and hematuria; Musculoskeletal: Negative for back pain and neck pain. Negative for swelling and trauma.;  Skin: . Negative for pruritus,  rash, abrasions, bruising and skin lesion.; ulcerations Neuro: Negative for headache, lightheadedness and neck stiffness. Negative for weakness, altered level of consciousness , altered mental status, extremity weakness, burning feet, involuntary movement, seizure and syncope.  Psych: negative for anxiety, depression, insomnia, tearfulness, panic attacks, hallucinations, paranoia, suicidal or homicidal ideation   Past Medical History  Diagnosis Date  . Borderline diabetes   . Hypertension   . Anxiety   . Emphysema   . COPD (chronic obstructive pulmonary disease)   . OSA on CPAP     CPAP at night   . Dizziness     pt believes this is motion sickness or vertigo  . GERD (gastroesophageal reflux disease)   . Headache   . Cancer 1990    cervical   . Anemia     Past Surgical History  Procedure Laterality Date  . Tubal ligation    . Cardiac catheterization    . Colonoscopy  09/05/2012    Procedure: COLONOSCOPY;  Surgeon: Theda Belfast, MD;  Location: [facility];  Service: Endoscopy;  Laterality: N/A;    Medications:  HOME MEDS: Prior to Admission medications   Medication Sig Start Date End Date Taking? Authorizing Provider  albuterol (PROVENTIL HFA;VENTOLIN HFA) 108 (90 BASE) MCG/ACT inhaler Inhale 2 puffs into the lungs every 4 (four) hours as needed for wheezing. 11/25/12 11/25/13 Yes Leslye Peer, MD  albuterol (PROVENTIL) (2.5 MG/3ML) 0.083% nebulizer solution Take 2.5 mg by nebulization 4 (four) times daily as needed. DX: 496. For wheezing 11/25/12 11/25/13 Yes Leslye Peer, MD  aspirin EC 81 MG tablet Take 81 mg by mouth daily.   Yes Historical Provider, MD  budesonide-formoterol (  SYMBICORT) 160-4.5 MCG/ACT inhaler Inhale 2 puffs into the lungs 2 (two) times daily. 11/26/12  Yes Leslye Peer, MD  Cyanocobalamin (VITAMIN B-12) 500 MCG SUBL Place 1 tablet under the tongue daily.   Yes Historical Provider, MD  fluticasone (FLONASE) 50 MCG/ACT nasal spray Place 2 sprays into  the nose 2 (two) times daily. 07/08/12 07/08/13 Yes Leslye Peer, MD  loratadine (CLARITIN) 10 MG tablet take 1 tablet by mouth once daily 11/14/12  Yes Leslye Peer, MD  omeprazole (PRILOSEC) 20 MG capsule Take 20 mg by mouth 2 (two) times daily.   Yes Historical Provider, MD  potassium chloride SA (K-DUR,KLOR-CON) 20 MEQ tablet Take 20 mEq by mouth 2 (two) times daily.   Yes Historical Provider, MD  sodium chloride (OCEAN) 0.65 % nasal spray Place 1 spray into the nose at bedtime as needed. For nasal congestion   Yes Historical Provider, MD  SPIRIVA HANDIHALER 18 MCG inhalation capsule inhale contents of 1 capsule by mouth once daily 12/15/12  Yes Leslye Peer, MD  triamterene-hydrochlorothiazide (DYAZIDE) 37.5-25 MG per capsule take 1 capsule by mouth once daily 08/11/12  Yes Leslye Peer, MD     Allergies:  No Known Allergies  Social History:   reports that she has been smoking Cigarettes.  She has a 36 pack-year smoking history. She has never used smokeless tobacco. She reports that she does not drink alcohol or use illicit drugs.  Family History: Family History  Problem Relation Age of Onset  . Lung cancer Paternal Aunt   . Lung cancer Paternal Grandfather      Physical Exam: Filed Vitals:   01/15/13 0200 01/15/13 0256 01/15/13 0548 01/15/13 0600  BP: 122/61 130/73 139/112 135/73  Pulse: 94   91  Temp:   98.1 F (36.7 C)   TempSrc:   Oral   Resp: 22 22 25 22   SpO2: 97% 99% 97% 95%   Blood pressure 135/73, pulse 91, temperature 98.1 F (36.7 C), temperature source Oral, resp. rate 22, SpO2 95.00%.  GEN:  Pleasant patient lying in the stretcher in no acute distress; cooperative with exam. Not having respiratory difficulty. PSYCH:  alert and oriented x4; does not appear anxious or depressed; affect is appropriate. HEENT: Mucous membranes pink and anicteric; PERRLA; EOM intact; no cervical lymphadenopathy nor thyromegaly or carotid bruit; no JVD; There were no stridor. Neck  is very supple. Breasts:: Not examined CHEST WALL: No tenderness CHEST: Normal respiration, decreased breath sounds and having tight wheezing but no rales. HEART: Regular rate and rhythm.  There are no murmur, rub, or gallops.   BACK: No kyphosis or scoliosis; no CVA tenderness ABDOMEN: soft and non-tender; no masses, no organomegaly, normal abdominal bowel sounds; no pannus; no intertriginous candida. There is no rebound and no distention. Rectal Exam: Not done EXTREMITIES: No bone or joint deformity; age-appropriate arthropathy of the hands and knees; no edema; no ulcerations.  There is no calf tenderness. Genitalia: not examined PULSES: 2+ and symmetric SKIN: Normal hydration no rash or ulceration CNS: Cranial nerves 2-12 grossly intact no focal lateralizing neurologic deficit.  Speech is fluent; uvula elevated with phonation, facial symmetry and tongue midline. DTR are normal bilaterally, cerebella exam is intact, barbinski is negative and strengths are equaled bilaterally.  No sensory loss.   Labs on Admission:  Basic Metabolic Panel:  Recent Labs Lab 01/15/13 0245 01/15/13 0305  NA 140 144  K 3.7 3.8  CL 106 107  CO2 26  --  GLUCOSE 137* 134*  BUN 11 10  CREATININE 0.85 0.80  CALCIUM 9.2  --    Liver Function Tests:  Recent Labs Lab 01/15/13 0245  AST 188*  ALT 210*  ALKPHOS 43  BILITOT 0.3  PROT 7.4  ALBUMIN 3.5   No results found for this basename: LIPASE, AMYLASE,  in the last 168 hours No results found for this basename: AMMONIA,  in the last 168 hours CBC:  Recent Labs Lab 01/15/13 0245 01/15/13 0305  WBC 7.7  --   NEUTROABS 6.5  --   HGB 11.4* 11.2*  HCT 31.4* 33.0*  MCV 82.4  --   PLT 199  --    Cardiac Enzymes: No results found for this basename: CKTOTAL, CKMB, CKMBINDEX, TROPONINI,  in the last 168 hours  CBG: No results found for this basename: GLUCAP,  in the last 168 hours   Radiological Exams on Admission: Dg Chest 2  View  01/15/2013  *RADIOLOGY REPORT*  Clinical Data: Sudden onset of shortness of breath.  CHEST - 2 VIEW  Comparison: Chest radiograph performed 12/19/2012  Findings: The lungs are well-aerated.  Vascular congestion is noted, with mild bibasilar opacities, raising concern for mild interstitial edema.  Trace fluid is seen tracking along the right minor fissure.  No significant pleural effusion or pneumothorax is seen.  The heart is borderline normal in size.  No acute osseous abnormalities are seen.  IMPRESSION: Vascular congestion, with mild bibasilar opacities, raising concern for mild interstitial edema.   Original Report Authenticated By: Tonia Ghent, M.D.     EKG: Independently reviewed. NSR there is no acute ST-T changes.   Assessment/Plan Present on Admission:  . COPD with exacerbation . Diabetes mellitus . Hypertension . Obstructive sleep apnea Elevated LFTs.   PLAN: Will admit her for increased DOE.  I suspect this is mainly COPD exacerbation and a little volume overloaded.  I will continue with 40mg  PO Prednisone, along with oxygen and frequent nebs.  I will give her Lasix 20mg  IV daily along with her home diuretics just to get a little bit more fluid off. Please follow her creatinine carefully.   Since she has been on Levoquin, will change her antibiotic to IV Zithromax.  With respect to her DM, she is diet control, and now with steroids, I will give Insulin sliding scale and place her on carb modified diet.  For her HTN, it has been stable.  Her sleep apnea requires q hs CPAP with water pressure of 10cm as per Dr Delton Coombes.  I will continue that.  Her elevated liver fx test could be some congestion vs medication.  I will obtain a RUQ [place] to exclude cholelithiasis along with hepatitis panel.  She is stable, full code, and will be admitted to [facility].  Thank you for allowing me to partake in the care of your nice patient.  Other plans as per orders.  Code Status: FULL  Unk Lightning, MD. [facility] Pager [phone] 7pm to 7am.  01/15/2013, 6:05 AM

## 2013-01-15 NOTE — Progress Notes (Signed)
UR Chart Review Completed  

## 2013-01-15 NOTE — ED Notes (Signed)
Patient short of breath during ambulation to bathroom.

## 2013-01-15 NOTE — ED Notes (Signed)
Dr Conley Rolls in with patient at this time.

## 2013-01-15 NOTE — Progress Notes (Signed)
  Echocardiogram 2D Echocardiogram has been performed.  Jorje Guild 01/15/2013, 12:23 PM

## 2013-01-15 NOTE — ED Notes (Addendum)
Patient with increased shortness of breath since yesterday am, has taken 5 neb treatments since waking up.  Last one was at 8pm last night.  Patient denies CP.  Patient was given 5mg /0.5mg  DuoNeb and 125mg  Solumedrol enroute to ED.

## 2013-01-15 NOTE — ED Notes (Signed)
Patient complaining of headache at this time.  Patient medicated per Dr Conley Rolls.

## 2013-01-16 ENCOUNTER — Inpatient Hospital Stay (HOSPITAL_COMMUNITY): Payer: Medicaid Other

## 2013-01-16 DIAGNOSIS — J9620 Acute and chronic respiratory failure, unspecified whether with hypoxia or hypercapnia: Secondary | ICD-10-CM | POA: Diagnosis present

## 2013-01-16 DIAGNOSIS — I509 Heart failure, unspecified: Secondary | ICD-10-CM

## 2013-01-16 DIAGNOSIS — I5031 Acute diastolic (congestive) heart failure: Secondary | ICD-10-CM

## 2013-01-16 DIAGNOSIS — I5189 Other ill-defined heart diseases: Secondary | ICD-10-CM | POA: Insufficient documentation

## 2013-01-16 LAB — COMPREHENSIVE METABOLIC PANEL
Alkaline Phosphatase: 35 U/L — ABNORMAL LOW (ref 39–117)
BUN: 15 mg/dL (ref 6–23)
CO2: 29 mEq/L (ref 19–32)
Chloride: 102 mEq/L (ref 96–112)
Creatinine, Ser: 1 mg/dL (ref 0.50–1.10)
GFR calc Af Amer: 73 mL/min — ABNORMAL LOW (ref >90)
GFR calc non Af Amer: 63 mL/min — ABNORMAL LOW (ref >90)
Glucose, Bld: 107 mg/dL — ABNORMAL HIGH (ref 70–99)
Potassium: 3.6 mEq/L (ref 3.5–5.1)
Total Bilirubin: 0.3 mg/dL (ref 0.3–1.2)

## 2013-01-16 LAB — CBC
HCT: 32.7 % — ABNORMAL LOW (ref 36.0–46.0)
Hemoglobin: 11.5 g/dL — ABNORMAL LOW (ref 12.0–15.0)
MCV: 83.6 fL (ref 78.0–100.0)
WBC: 11.5 10*3/uL — ABNORMAL HIGH (ref 4.0–10.5)

## 2013-01-16 LAB — GLUCOSE, CAPILLARY
Glucose-Capillary: 153 mg/dL — ABNORMAL HIGH (ref 70–99)
Glucose-Capillary: 196 mg/dL — ABNORMAL HIGH (ref 70–99)

## 2013-01-16 MED ORDER — METHYLPREDNISOLONE SODIUM SUCC 125 MG IJ SOLR
60.0000 mg | Freq: Four times a day (QID) | INTRAMUSCULAR | Status: DC
  Administered 2013-01-16 – 2013-01-18 (×9): 60 mg via INTRAVENOUS
  Filled 2013-01-16 (×12): qty 0.96

## 2013-01-16 MED ORDER — CLOTRIMAZOLE 1 % VA CREA
1.0000 | TOPICAL_CREAM | Freq: Every day | VAGINAL | Status: DC
  Administered 2013-01-16 – 2013-01-19 (×4): 1 via VAGINAL
  Filled 2013-01-16: qty 45

## 2013-01-16 NOTE — Progress Notes (Signed)
The patient dropped to 80% on 2L of O2 via N/C after ambulating approximately 350 feet.  The patient recovered to 93% on 2 L of O2 when resting.

## 2013-01-16 NOTE — ED Provider Notes (Signed)
Medical screening examination/treatment/procedure(s) were performed by non-physician practitioner and as supervising physician I was immediately available for consultation/collaboration.  Sunnie Nielsen, MD 01/16/13 [phone]

## 2013-01-16 NOTE — Progress Notes (Signed)
[facility] PROGRESS NOTE  Denae Zulueta ZOX:[id number] DOB: Oct 24, 1956 DOA: 01/15/2013 PCP: Dorrene German, MD  Brief narrative 57 y.o. female with hx of severe COPD and asthma, one home oxygen of 2 L [place], obesity, sleep apnea on 10cm CPAP under the care of Dr Delton Coombes, HTN, GERD, presents to the ER with 3 days hx of increase white sputum productive cough, increase shortness of breath and wheezing, unrelieved with nebulizer at home. She denied any fever or chills, chest pain only with coughing. Evaluation in the ER included a CXR although without infiltrate, does have interstitial edema, a normal WBC, normal renal fx tests. She was given several nebs, IV solumedrol in the ambulance, hospitalist was asked to admit her for COPD exacerbation. I noted she has been on PO levoquin. She also has vague and mild abdominal discomfort. There has been no nausea or vomiting. She was noted to have elevated LFTs but not alkaline phosphatase.    Assessment/Plan: 1. Acute on chronic respiratory failure: Precipitated by COPD exacerbation and possible acute diastolic CHF. Continue treatment for underlying causes. Continue oxygen. Dyspnea at rest is better but still quite dyspneic with exertion and O2 sats dropped to 80% on oxygen. 2. Acute exacerbation of COPD: Continue IV azithromycin and bronchodilator nebulization's. Patient continues to have significant DOE and hypoxemia with activity. We'll change steroids to IV. Monitor closely. 3. Possible acute diastolic CHF: Continue IV Lasix. Echo shows moderate LVH and LVEF 65-70%. 4. Type II DM: Continue SSI. Fluctuating control. Monitor while on IV steroids. 5. Hypertension: Reasonably controlled 6. OSA: Continue nightly CPAP 7. Abdominal pain: resolved. Mild transaminitis. LFTs have improved.? Fatty liver on abdominal ultrasound. Unclear etiology. 8. Anemia: Possibly chronic. Stable  Code Status: Full Family Communication: Discussed with patient. Disposition Plan:  Home when medically stable. Not stable for discharge.   Consultants:  None   Procedures:  None  Antibiotics:  IV azithromycin 3/6 >   HPI/Subjective: Dyspnea at rest is better but still has significant DOE with activity. Nursing staff ambulated patient to nursing station on oxygen and patient became dyspneic and dropped saturation to 82%. Cough is better. No chest pain. No abdominal pain, nausea or vomiting. Complains of vaginal pruritus/thrush  Objective: Filed Vitals:   01/16/13 0629 01/16/13 0816 01/16/13 1401 01/16/13 1526  BP: 120/85  128/77   Pulse: 92  98   Temp: 97.3 F (36.3 C)  97.6 F (36.4 C)   TempSrc: Oral  Oral   Resp: 20  20   Height:      Weight: 96.888 kg (213 lb 9.6 oz)     SpO2: 98% 97% 95% 95%    Intake/Output Summary (Last 24 hours) at 01/16/13 1645 Last data filed at 01/16/13 1402  Gross per 24 hour  Intake    723 ml  Output   2947 ml  Net  -2224 ml   Filed Weights   01/15/13 0645 01/16/13 0629  Weight: 96.4 kg (212 lb 8.4 oz) 96.888 kg (213 lb 9.6 oz)    Exam:   General exam: Comfortable. Morbidly obese  Respiratory system: Reduced breath sounds bilaterally with occasional bilateral expiratory rhonchi. No increased work of breathing. Able to speak in full sentences.  Cardiovascular system: S1 & S2 heard, RRR. No JVD, murmurs, gallops, clicks but has trace bilateral leg edema. Telemetry shows sinus rhythm in the 80s to 90s  Gastrointestinal system: Abdomen is nondistended, soft and nontender. Normal bowel sounds heard.  Central nervous system: Alert and oriented. No focal neurological deficits.  Extremities: Symmetric 5 x 5 power.   Data Reviewed: Basic Metabolic Panel:  Recent Labs Lab 01/15/13 0245 01/15/13 0305 01/16/13 0618  NA 140 144 142  K 3.7 3.8 3.6  CL 106 107 102  CO2 26  --  29  GLUCOSE 137* 134* 107*  BUN 11 10 15   CREATININE 0.85 0.80 1.00  CALCIUM 9.2  --  9.3   Liver Function Tests:  Recent  Labs Lab 01/15/13 0245 01/16/13 0618  AST 188* 49*  ALT 210* 138*  ALKPHOS 43 35*  BILITOT 0.3 0.3  PROT 7.4 7.3  ALBUMIN 3.5 3.5   No results found for this basename: LIPASE, AMYLASE,  in the last 168 hours No results found for this basename: AMMONIA,  in the last 168 hours CBC:  Recent Labs Lab 01/15/13 0245 01/15/13 0305 01/16/13 0618  WBC 7.7  --  11.5*  NEUTROABS 6.5  --   --   HGB 11.4* 11.2* 11.5*  HCT 31.4* 33.0* 32.7*  MCV 82.4  --  83.6  PLT 199  --  249   Cardiac Enzymes: No results found for this basename: CKTOTAL, CKMB, CKMBINDEX, TROPONINI,  in the last 168 hours BNP (last 3 results) No results found for this basename: PROBNP,  in the last 8760 hours CBG:  Recent Labs Lab 01/15/13 1630 01/15/13 2140 01/16/13 0639 01/16/13 1140 01/16/13 1602  GLUCAP 134* 137* 112* 167* 153*    No results found for this or any previous visit (from the past 240 hour(s)).   Studies: Dg Chest 2 View  01/15/2013  *RADIOLOGY REPORT*  Clinical Data: Sudden onset of shortness of breath.  CHEST - 2 VIEW  Comparison: Chest radiograph performed 12/19/2012  Findings: The lungs are well-aerated.  Vascular congestion is noted, with mild bibasilar opacities, raising concern for mild interstitial edema.  Trace fluid is seen tracking along the right minor fissure.  No significant pleural effusion or pneumothorax is seen.  The heart is borderline normal in size.  No acute osseous abnormalities are seen.  IMPRESSION: Vascular congestion, with mild bibasilar opacities, raising concern for mild interstitial edema.   Original Report Authenticated By: Tonia Ghent, M.D.    US Abdomen Complete  01/15/2013  *RADIOLOGY REPORT*  Clinical Data:  Elevated LFTs, history hypertension, COPD, GERD, cancer smoking, borderline diabetes  ULTRASOUND ABDOMEN:  Technique:  Sonography of upper abdominal structures was performed.  Comparison:  None  Gallbladder:  Normally distended without stones or wall  thickening. No pericholecystic fluid or sonographic Murphy sign.  Common bile duct:  Normal caliber 4 mm diameter  Liver:  Echogenic, likely fatty infiltration, though this can be seen with cirrhosis and certain infiltrative disorders.  No gross hepatic mass or nodularity, though intrahepatic visualization is severely limited by poor sound transmission through echogenic parenchyma.  Hepatopetal portal venous flow.  IVC:  Normal appearance  Pancreas:  Small portion of pancreatic body normal appearance, remainder obscured by combination of bowel gas, increased hepatic echogenicity and body habitus.  Spleen:  Normal morphology, 8.0 cm length  Right kidney:  11.0 cm length. Normal morphology without mass or hydronephrosis.  Left kidney:  10.5 cm length. Normal morphology without mass or hydronephrosis.  Aorta:  Significant portions obscured by bowel gas.  Proximally normal caliber.  Other:  No free fluid  IMPRESSION: Incomplete visualization of pancreas and liver as above. Probable fatty infiltration of liver. If better intrahepatic visualization is required, recommend CT or MR imaging with contrast.   Original Report Authenticated By:  Ulyses Southward, M.D.    2-D echo 01/15/13 Study Conclusions  Left ventricle: The cavity size was normal. Wall thickness was increased in a pattern of moderate LVH. Systolic function was vigorous. The estimated ejection fraction was in the range of 65% to 70%. Wall motion was normal; there were no regional wall motion abnormalities.    Additional labs:   Scheduled Meds: . albuterol  2.5 mg Nebulization Q6H  . amitriptyline  100 mg Oral QHS  . aspirin EC  81 mg Oral Daily  . azithromycin  500 mg Oral Daily  . budesonide-formoterol  2 puff Inhalation BID  . clotrimazole  1 Applicatorful Vaginal QHS  . docusate sodium  100 mg Oral BID  . enoxaparin (LOVENOX) injection  40 mg Subcutaneous Q24H  . fluticasone  2 spray Each Nare BID  . furosemide  20 mg Intravenous Daily  .  insulin aspart  0-15 Units Subcutaneous TID WC  . insulin aspart  0-5 Units Subcutaneous QHS  . loratadine  10 mg Oral Daily  . methylPREDNISolone (SOLU-MEDROL) injection  60 mg Intravenous Q6H  . pantoprazole  40 mg Oral Daily  . potassium chloride SA  20 mEq Oral BID  . sodium chloride  3 mL Intravenous Q12H  . sodium chloride  3 mL Intravenous Q12H  . tiotropium  18 mcg Inhalation Daily  . triamterene-hydrochlorothiazide  1 each Oral Daily   Continuous Infusions:   Active Problems:   COPD with exacerbation   Diabetes mellitus   Hypertension   Obstructive sleep apnea    Time spent: 35 minutes    [facility]  [facility] Pager [phone].   If 8PM-8AM, please contact night-coverage at [email or web address], password [facility] 01/16/2013, 4:45 PM  LOS: 1 day

## 2013-01-17 LAB — GLUCOSE, CAPILLARY
Glucose-Capillary: 126 mg/dL — ABNORMAL HIGH (ref 70–99)
Glucose-Capillary: 158 mg/dL — ABNORMAL HIGH (ref 70–99)

## 2013-01-17 NOTE — Progress Notes (Signed)
Placed patient on CPAP via auto-mode with maximum pressure set at 20cm and minimum pressure set at 4cm. Oxygen set at 2lpm. Patient c/o left arm pain,nurse notified on arm pain

## 2013-01-17 NOTE — Progress Notes (Addendum)
[facility] PROGRESS NOTE  Stephanie Schultz WNU:[id number] DOB: [birth year] DOA: 01/15/2013 PCP: Dorrene German, MD  Brief narrative 57 y.o. female with hx of severe COPD and asthma, one home oxygen of 2 L [place], obesity, sleep apnea on 10cm CPAP under the care of Dr Delton Coombes, HTN, GERD, presents to the ER with 3 days hx of increase white sputum productive cough, increase shortness of breath and wheezing, unrelieved with nebulizer at home. She denied any fever or chills, chest pain only with coughing. Evaluation in the ER included a CXR although without infiltrate, does have interstitial edema, a normal WBC, normal renal fx tests. She was given several nebs, IV solumedrol in the ambulance, hospitalist was asked to admit her for COPD exacerbation. I noted she has been on PO levoquin. She also has vague and mild abdominal discomfort. There has been no nausea or vomiting. She was noted to have elevated LFTs but not alkaline phosphatase.    Assessment/Plan: 1. Acute on chronic respiratory failure: Secondary to COPD, will check BNP to check for possible diastolic CHF.  2. Acute exacerbation of COPD: Continue IV solumedrol, zithromax.  3. Possible acute diastolic CHF: Continue IV Lasix. Echo shows moderate LVH and LVEF 65-70%.  Will check BNP.  4. Type II DM: Continue sliding scale insulin. Glucose well controlled.                         5. Hypertension: BP stable                              6. OSA: Continue  CPAP at night  7.  Abdominal pain: Fatty liver on ultrasound,  May be stretching of liver capsule. Will recheck LFT in am.  8.   Anemia: Possibly chronic. Stable  Code Status: Full Family Communication: Discussed with patient. Disposition Plan: Home when medically stable. Not stable for discharge.   Consultants:  None   Procedures:  None  Antibiotics:  IV azithromycin 3/6 >   HPI/Subjective: Patient says that her breathing has improved but still not at her  baseline.  Objective: Filed Vitals:   01/17/13 0118 01/17/13 0506 01/17/13 0822 01/17/13 1139  BP:  143/72  137/80  Pulse:  106  84  Temp:  97.8 F (36.6 C)    TempSrc:  Oral    Resp:  18  18  Height:      Weight:  95.255 kg (210 lb)    SpO2: 96% 98% 98% 100%    Intake/Output Summary (Last 24 hours) at 01/17/13 1327 Last data filed at 01/17/13 1256  Gross per 24 hour  Intake   2080 ml  Output   3050 ml  Net   -970 ml   Filed Weights   01/15/13 0645 01/16/13 0629 01/17/13 0506  Weight: 96.4 kg (212 lb 8.4 oz) 96.888 kg (213 lb 9.6 oz) 95.255 kg (210 lb)    Exam:  Physical Exam: Head: Normocephalic, atraumatic.  Eyes: No signs of jaundice, EOMI Nose: Mucous membranes dry.   Neck: supple,No deformities, masses, or tenderness noted. Lungs: Normal respiratory effort. Scattered wheezing. Heart: Regular RR. S1 and S2 normal  Abdomen: BS normoactive. Soft, Nondistended, non-tender.  Extremities: No pretibial edema, no erythema  .   Data Reviewed: Basic Metabolic Panel:  Recent Labs Lab 01/15/13 0245 01/15/13 0305 01/16/13 0618  NA 140 144 142  K 3.7 3.8 3.6  CL 106 107 102  CO2 26  --  29  GLUCOSE 137* 134* 107*  BUN 11 10 15   CREATININE 0.85 0.80 1.00  CALCIUM 9.2  --  9.3   Liver Function Tests:  Recent Labs Lab 01/15/13 0245 01/16/13 0618  AST 188* 49*  ALT 210* 138*  ALKPHOS 43 35*  BILITOT 0.3 0.3  PROT 7.4 7.3  ALBUMIN 3.5 3.5   No results found for this basename: LIPASE, AMYLASE,  in the last 168 hours No results found for this basename: AMMONIA,  in the last 168 hours CBC:  Recent Labs Lab 01/15/13 0245 01/15/13 0305 01/16/13 0618  WBC 7.7  --  11.5*  NEUTROABS 6.5  --   --   HGB 11.4* 11.2* 11.5*  HCT 31.4* 33.0* 32.7*  MCV 82.4  --  83.6  PLT 199  --  249   Cardiac Enzymes: No results found for this basename: CKTOTAL, CKMB, CKMBINDEX, TROPONINI,  in the last 168 hours BNP (last 3 results) No results found for this  basename: PROBNP,  in the last 8760 hours CBG:  Recent Labs Lab 01/16/13 1140 01/16/13 1602 01/16/13 2142 01/17/13 0540 01/17/13 1119  GLUCAP 167* 153* 196* 126* 158*    No results found for this or any previous visit (from the past 240 hour(s)).   Studies: Dg Chest Port 1 View  01/16/2013  *RADIOLOGY REPORT*  Clinical Data: Shortness of breath, COPD/CHF exacerbation  PORTABLE CHEST - 1 VIEW  Comparison: 01/15/2013  Findings: Increased interstitial markings, possibly chronic.  No frank interstitial edema.  Mild bibasilar opacities, likely atelectasis.  No pleural effusion or pneumothorax.  Mild cardiomegaly.  IMPRESSION: Mild cardiomegaly.  No frank interstitial edema.  Mild bibasilar opacities, likely atelectasis.   Original Report Authenticated By: Charline Bills, M.D.    2-D echo 01/15/13 Study Conclusions  Left ventricle: The cavity size was normal. Wall thickness was increased in a pattern of moderate LVH. Systolic function was vigorous. The estimated ejection fraction was in the range of 65% to 70%. Wall motion was normal; there were no regional wall motion abnormalities.    Additional labs:   Scheduled Meds: . albuterol  2.5 mg Nebulization Q6H  . amitriptyline  100 mg Oral QHS  . aspirin EC  81 mg Oral Daily  . azithromycin  500 mg Oral Daily  . budesonide-formoterol  2 puff Inhalation BID  . clotrimazole  1 Applicatorful Vaginal QHS  . docusate sodium  100 mg Oral BID  . enoxaparin (LOVENOX) injection  40 mg Subcutaneous Q24H  . fluticasone  2 spray Each Nare BID  . furosemide  20 mg Intravenous Daily  . insulin aspart  0-15 Units Subcutaneous TID WC  . insulin aspart  0-5 Units Subcutaneous QHS  . loratadine  10 mg Oral Daily  . methylPREDNISolone (SOLU-MEDROL) injection  60 mg Intravenous Q6H  . pantoprazole  40 mg Oral Daily  . potassium chloride SA  20 mEq Oral BID  . sodium chloride  3 mL Intravenous Q12H  . sodium chloride  3 mL Intravenous Q12H  .  tiotropium  18 mcg Inhalation Daily  . triamterene-hydrochlorothiazide  1 each Oral Daily   Continuous Infusions:   Principal Problem:   Respiratory failure, acute-on-chronic Active Problems:   COPD with exacerbation   Diabetes mellitus   Hypertension   Obstructive sleep apnea   Diastolic CHF, acute    Time spent: 35 minutes    [facility]  [facility] Pager [phone].   If 8PM-8AM, please contact night-coverage  at [email or web address], password [facility] 01/17/2013, 1:27 PM  LOS: 2 days

## 2013-01-18 LAB — COMPREHENSIVE METABOLIC PANEL
AST: 21 U/L (ref 0–37)
CO2: 29 mEq/L (ref 19–32)
Chloride: 96 mEq/L (ref 96–112)
Creatinine, Ser: 0.85 mg/dL (ref 0.50–1.10)
GFR calc non Af Amer: 76 mL/min — ABNORMAL LOW (ref >90)
Glucose, Bld: 346 mg/dL — ABNORMAL HIGH (ref 70–99)
Total Bilirubin: 0.2 mg/dL — ABNORMAL LOW (ref 0.3–1.2)

## 2013-01-18 LAB — GLUCOSE, CAPILLARY
Glucose-Capillary: 327 mg/dL — ABNORMAL HIGH (ref 70–99)
Glucose-Capillary: 294 mg/dL — ABNORMAL HIGH (ref 70–99)
Glucose-Capillary: 247 mg/dL — ABNORMAL HIGH (ref 70–99)

## 2013-01-18 MED ORDER — INSULIN GLARGINE 100 UNIT/ML ~~LOC~~ SOLN
10.0000 [IU] | Freq: Every day | SUBCUTANEOUS | Status: DC
  Administered 2013-01-18: 10 [IU] via SUBCUTANEOUS

## 2013-01-18 MED ORDER — METHYLPREDNISOLONE SODIUM SUCC 40 MG IJ SOLR
40.0000 mg | Freq: Four times a day (QID) | INTRAMUSCULAR | Status: DC
  Administered 2013-01-18 – 2013-01-19 (×3): 40 mg via INTRAVENOUS
  Filled 2013-01-18 (×7): qty 1

## 2013-01-18 NOTE — Progress Notes (Signed)
Pt. Is not ready to wear CPAP at this time.  Pt. Is encouraged to call for RT when ready.  RN aware.

## 2013-01-18 NOTE — Progress Notes (Signed)
[facility] PROGRESS NOTE  Stephanie Schultz AVW:[id number] DOB: 05-15-1956 DOA: 01/15/2013 PCP: Dorrene German, MD  Brief narrative 57 y.o. female with hx of severe COPD and asthma, one home oxygen of 2 L [place], obesity, sleep apnea on 10cm CPAP under the care of Dr Delton Coombes, HTN, GERD, presents to the ER with 3 days hx of increase white sputum productive cough, increase shortness of breath and wheezing, unrelieved with nebulizer at home. She denied any fever or chills, chest pain only with coughing. Evaluation in the ER included a CXR although without infiltrate, does have interstitial edema, a normal WBC, normal renal fx tests. She was given several nebs, IV solumedrol in the ambulance, hospitalist was asked to admit her for COPD exacerbation. I noted she has been on PO levoquin. She also has vague and mild abdominal discomfort. There has been no nausea or vomiting. She was noted to have elevated LFTs but not alkaline phosphatase.    Assessment/Plan: 1. Acute on chronic respiratory failure: Secondary to COPD,BNP is normal   2. Acute exacerbation of COPD: Continue IV solumedrol, zithromax. Tolerating Bipap  3. Possible acute diastolic CHF: Echo shows moderate LVH and LVEF 65-70%. BNP is normal, output is negative four liters, if cr starts to go up in am, consider switching to po lasix.  4. Type II DM: Continue sliding scale insulin. Glucose elevated due to steroids, added lantus 10 units at bedtime.                         5. Hypertension: BP stable                              6. OSA: Continue  CPAP at night  7.  Abdominal pain: Fatty liver on ultrasound,  May be stretching of liver capsule. LFT's are back to normal.  8.   Anemia: Possibly chronic. Stable  Code Status: Full Family Communication: Discussed with patient. Disposition Plan: Home when medically stable. Not stable for discharge.   Consultants:  None   Procedures:  None  Antibiotics:  IV azithromycin 3/6 >    HPI/Subjective: Patient says that her breathing has improved but still not at her baseline.  Objective: Filed Vitals:   01/18/13 0145 01/18/13 0533 01/18/13 0820 01/18/13 1400  BP:  140/62  135/69  Pulse: 98 90  109  Temp:  97.8 F (36.6 C)  98.1 F (36.7 C)  TempSrc:  Axillary  Oral  Resp: 18 18  20   Height:      Weight:  91.672 kg (202 lb 1.6 oz)    SpO2: 97% 98% 96% 96%    Intake/Output Summary (Last 24 hours) at 01/18/13 1511 Last data filed at 01/18/13 1300  Gross per 24 hour  Intake   2000 ml  Output   2650 ml  Net   -650 ml   Filed Weights   01/16/13 0629 01/17/13 0506 01/18/13 0533  Weight: 96.888 kg (213 lb 9.6 oz) 95.255 kg (210 lb) 91.672 kg (202 lb 1.6 oz)    Exam:  Physical Exam: Head: Normocephalic, atraumatic.  Eyes: No signs of jaundice, EOMI Nose: Mucous membranes dry.   Neck: supple,No deformities, masses, or tenderness noted. Lungs: Normal respiratory effort. Scattered wheezing. Heart: Regular RR. S1 and S2 normal  Abdomen: BS normoactive. Soft, Nondistended, non-tender.  Extremities: No pretibial edema, no erythema  .   Data Reviewed: Basic Metabolic Panel:  Recent Labs  Lab 01/15/13 0245 01/15/13 0305 01/16/13 0618 01/18/13 0540  NA 140 144 142 137  K 3.7 3.8 3.6 4.3  CL 106 107 102 96  CO2 26  --  29 29  GLUCOSE 137* 134* 107* 346*  BUN 11 10 15 19   CREATININE 0.85 0.80 1.00 0.85  CALCIUM 9.2  --  9.3 9.5   Liver Function Tests:  Recent Labs Lab 01/15/13 0245 01/16/13 0618 01/18/13 0540  AST 188* 49* 21  ALT 210* 138* 84*  ALKPHOS 43 35* 83  BILITOT 0.3 0.3 0.2*  PROT 7.4 7.3 7.9  ALBUMIN 3.5 3.5 3.8   No results found for this basename: LIPASE, AMYLASE,  in the last 168 hours No results found for this basename: AMMONIA,  in the last 168 hours CBC:  Recent Labs Lab 01/15/13 0245 01/15/13 0305 01/16/13 0618  WBC 7.7  --  11.5*  NEUTROABS 6.5  --   --   HGB 11.4* 11.2* 11.5*  HCT 31.4* 33.0* 32.7*  MCV  82.4  --  83.6  PLT 199  --  249   Cardiac Enzymes: No results found for this basename: CKTOTAL, CKMB, CKMBINDEX, TROPONINI,  in the last 168 hours BNP (last 3 results)  Recent Labs  01/17/13 1410  PROBNP 103.2   CBG:  Recent Labs Lab 01/17/13 1119 01/17/13 1626 01/17/13 2121 01/18/13 0445 01/18/13 1150  GLUCAP 158* 268* 275* 327* 294*    No results found for this or any previous visit (from the past 240 hour(s)).   Studies: Dg Chest Port 1 View  01/16/2013  *RADIOLOGY REPORT*  Clinical Data: Shortness of breath, COPD/CHF exacerbation  PORTABLE CHEST - 1 VIEW  Comparison: 01/15/2013  Findings: Increased interstitial markings, possibly chronic.  No frank interstitial edema.  Mild bibasilar opacities, likely atelectasis.  No pleural effusion or pneumothorax.  Mild cardiomegaly.  IMPRESSION: Mild cardiomegaly.  No frank interstitial edema.  Mild bibasilar opacities, likely atelectasis.   Original Report Authenticated By: Charline Bills, M.D.    2-D echo 01/15/13 Study Conclusions  Left ventricle: The cavity size was normal. Wall thickness was increased in a pattern of moderate LVH. Systolic function was vigorous. The estimated ejection fraction was in the range of 65% to 70%. Wall motion was normal; there were no regional wall motion abnormalities.    Additional labs:   Scheduled Meds: . albuterol  2.5 mg Nebulization Q6H  . amitriptyline  100 mg Oral QHS  . aspirin EC  81 mg Oral Daily  . azithromycin  500 mg Oral Daily  . budesonide-formoterol  2 puff Inhalation BID  . clotrimazole  1 Applicatorful Vaginal QHS  . docusate sodium  100 mg Oral BID  . enoxaparin (LOVENOX) injection  40 mg Subcutaneous Q24H  . fluticasone  2 spray Each Nare BID  . furosemide  20 mg Intravenous Daily  . insulin aspart  0-15 Units Subcutaneous TID WC  . insulin aspart  0-5 Units Subcutaneous QHS  . loratadine  10 mg Oral Daily  . methylPREDNISolone (SOLU-MEDROL) injection  60 mg  Intravenous Q6H  . pantoprazole  40 mg Oral Daily  . potassium chloride SA  20 mEq Oral BID  . sodium chloride  3 mL Intravenous Q12H  . sodium chloride  3 mL Intravenous Q12H  . tiotropium  18 mcg Inhalation Daily  . triamterene-hydrochlorothiazide  1 each Oral Daily   Continuous Infusions:   Principal Problem:   Respiratory failure, acute-on-chronic Active Problems:   COPD with exacerbation  Diabetes mellitus   Hypertension   Obstructive sleep apnea   Diastolic CHF, acute    Time spent: 30 minutes    [facility]  [facility] Pager [phone].   If 8PM-8AM, please contact night-coverage at [email or web address], password [facility] 01/18/2013, 3:11 PM  LOS: 3 days

## 2013-01-18 NOTE — Progress Notes (Signed)
Placed pt. On CPAP via [facility], auto settings (min 4.0, max 20.0) with 3 lpm O2 bleed in.  Pt. Tolerating well at this time.

## 2013-01-19 ENCOUNTER — Encounter (HOSPITAL_COMMUNITY): Payer: Self-pay | Admitting: Nurse Practitioner

## 2013-01-19 DIAGNOSIS — L0231 Cutaneous abscess of buttock: Secondary | ICD-10-CM

## 2013-01-19 DIAGNOSIS — Z87891 Personal history of nicotine dependence: Secondary | ICD-10-CM | POA: Diagnosis present

## 2013-01-19 DIAGNOSIS — Z720 Tobacco use: Secondary | ICD-10-CM | POA: Diagnosis present

## 2013-01-19 DIAGNOSIS — J9620 Acute and chronic respiratory failure, unspecified whether with hypoxia or hypercapnia: Principal | ICD-10-CM

## 2013-01-19 DIAGNOSIS — L03317 Cellulitis of buttock: Secondary | ICD-10-CM

## 2013-01-19 DIAGNOSIS — D573 Sickle-cell trait: Secondary | ICD-10-CM | POA: Diagnosis present

## 2013-01-19 DIAGNOSIS — R079 Chest pain, unspecified: Secondary | ICD-10-CM

## 2013-01-19 LAB — CBC
Hemoglobin: 12.8 g/dL (ref 12.0–15.0)
MCHC: 36.5 g/dL — ABNORMAL HIGH (ref 30.0–36.0)
Platelets: 278 10*3/uL (ref 150–400)

## 2013-01-19 LAB — BASIC METABOLIC PANEL
BUN: 22 mg/dL (ref 6–23)
Calcium: 9.5 mg/dL (ref 8.4–10.5)
GFR calc Af Amer: >90 mL/min (ref >90)
GFR calc non Af Amer: >90 mL/min (ref >90)
Potassium: 4.5 mEq/L (ref 3.5–5.1)
Sodium: 137 mEq/L (ref 135–145)

## 2013-01-19 LAB — GLUCOSE, CAPILLARY
Glucose-Capillary: 302 mg/dL — ABNORMAL HIGH (ref 70–99)
Glucose-Capillary: 197 mg/dL — ABNORMAL HIGH (ref 70–99)

## 2013-01-19 LAB — HEPATITIS PANEL, ACUTE: Hep B C IgM: NEGATIVE

## 2013-01-19 MED ORDER — PREDNISONE 50 MG PO TABS
50.0000 mg | ORAL_TABLET | Freq: Every day | ORAL | Status: DC
  Filled 2013-01-19 (×2): qty 1

## 2013-01-19 MED ORDER — INSULIN ASPART 100 UNIT/ML ~~LOC~~ SOLN
4.0000 [IU] | Freq: Three times a day (TID) | SUBCUTANEOUS | Status: DC
  Administered 2013-01-19 – 2013-01-20 (×4): 4 [IU] via SUBCUTANEOUS

## 2013-01-19 MED ORDER — PREDNISONE 20 MG PO TABS
40.0000 mg | ORAL_TABLET | Freq: Every day | ORAL | Status: DC
  Administered 2013-01-19 – 2013-01-20 (×2): 40 mg via ORAL
  Filled 2013-01-19 (×3): qty 2

## 2013-01-19 MED ORDER — ALBUTEROL SULFATE (5 MG/ML) 0.5% IN NEBU
2.5000 mg | INHALATION_SOLUTION | Freq: Four times a day (QID) | RESPIRATORY_TRACT | Status: DC
  Administered 2013-01-20 (×3): 2.5 mg via RESPIRATORY_TRACT
  Filled 2013-01-19 (×3): qty 0.5

## 2013-01-19 MED ORDER — INSULIN GLARGINE 100 UNIT/ML ~~LOC~~ SOLN
15.0000 [IU] | Freq: Every day | SUBCUTANEOUS | Status: DC
  Administered 2013-01-19: 15 [IU] via SUBCUTANEOUS

## 2013-01-19 NOTE — Telephone Encounter (Signed)
Spoke with Stephanie Schultz. Efraim Kaufmann is looking into reason behind needing new CPAP settings. Will call our office back.

## 2013-01-19 NOTE — Consult Note (Signed)
CARDIOLOGY CONSULT NOTE  Patient ID: Stephanie Schultz MRN: [id number], DOB/AGE: [birth year]   Admit date: 01/15/2013 Date of Consult: 01/19/2013   Primary Physician: Dorrene German, MD Primary Cardiologist: new - seen by Ermalene Postin, MD  Pt. Profile  57 year old female with reported history of nonobstructive CAD by catheterization approximately 4 years ago who presented with acute on chronic respiratory failure and now reports exertional chest pain.  Problem List  Past Medical History  Diagnosis Date  . Borderline diabetes   . Hypertension   . Anxiety   . Emphysema   . COPD (chronic obstructive pulmonary disease)   . OSA on CPAP     CPAP at night   . Dizziness     pt believes this is motion sickness or vertigo  . GERD (gastroesophageal reflux disease)   . Headache   . Cancer 1990    cervical   . Anemia   . Morbid obesity   . Sickle cell trait   . Chest pain     a. 2010 cath @ [facility] in [place]->"nl"  . Tobacco abuse     a. up to 3ppd from age 44 to 29, now 1/4 ppd (01/2013)    Past Surgical History  Procedure Laterality Date  . Tubal ligation    . Cardiac catheterization    . Colonoscopy  09/05/2012    Procedure: COLONOSCOPY;  Surgeon: Theda Belfast, MD;  Location: [facility];  Service: Endoscopy;  Laterality: N/A;     Allergies  No Known Allergies  HPI   57 year old female with the above problem list. She has a long history of tobacco abuse, smoking up to 3 packs per day for a good portion of her adult life. She says she was diagnosed with COPD approximately 5 years ago and subsequently placed on oxygen. She has since cut back in her smoking and currently smokes a quarter of a pack a day. While living in [place] about 4 years ago, she developed acute dyspnea as well as chest tightness with radiation down the left arm. She was apparently hospitalized and says that she underwent stress testing which was followed by diagnostic catheterization. She was told that she had  normal coronary arteries and that her heart was not responsible for her chest pain or dyspnea. She moved to [place] just over a year ago and has been followed closely by pulmonology. She wears oxygen 24 hours a day, except when she is smoking.  Patient reports that over the past year or more, she has experienced intermittent episodes of exertional substernal chest squeezing with radiation down her left arm that occurs with activity almost exclusively when she is not wearing her oxygen. Symptoms often last about 3 or 4 minutes and resolve after she is placed her oxygen back on and has rested. Symptoms occur infrequently for the most part and are not generally predictable. She was admitted on March 6 secondary to a three-day history of productive cough, dyspnea, and wheezing. Following admission, she has been treated for acute on chronic respiratory failure and COPD exacerbation as well as mild volume overload with normal proBNP. Echocardiogram was performed on March 6, and showed normal LV function with moderate LVH.  Since hospitalization, she has noted recurrence of exertional chest squeezing left arm discomfort anytime she takes off her oxygen to walk to and from the bathroom. Because of this, we have been called to evaluate. She currently is pain-free at rest and offers no complaints.  Inpatient Medications  . albuterol  2.5 mg Nebulization Q6H  . amitriptyline  100 mg Oral QHS  . aspirin EC  81 mg Oral Daily  . azithromycin  500 mg Oral Daily  . budesonide-formoterol  2 puff Inhalation BID  . clotrimazole  1 Applicatorful Vaginal QHS  . docusate sodium  100 mg Oral BID  . enoxaparin (LOVENOX) injection  40 mg Subcutaneous Q24H  . fluticasone  2 spray Each Nare BID  . furosemide  20 mg Intravenous Daily  . insulin aspart  0-15 Units Subcutaneous TID WC  . insulin aspart  0-5 Units Subcutaneous QHS  . insulin aspart  4 Units Subcutaneous TID WC  . insulin glargine  15 Units Subcutaneous QHS    . loratadine  10 mg Oral Daily  . pantoprazole  40 mg Oral Daily  . potassium chloride SA  20 mEq Oral BID  . predniSONE  50 mg Oral Q breakfast  . sodium chloride  3 mL Intravenous Q12H  . sodium chloride  3 mL Intravenous Q12H  . tiotropium  18 mcg Inhalation Daily  . triamterene-hydrochlorothiazide  1 each Oral Daily   Family History Family History  Problem Relation Age of Onset  . Lung cancer Paternal Aunt   . Lung cancer Paternal Grandfather   . Other Father     unaware of father's medical history  . Diabetes Mother     alive @ 68  . Other      multiple siblings a&w.    Social History History   Social History  . Marital Status: Single    Spouse Name: N/A    Number of Children: 3  . Years of Education: N/A   Occupational History  . Disabled     Emphysema   Social History Main Topics  . Smoking status: Current Every Day Smoker -- 3.00 packs/day for 36 years    Types: Cigarettes    Last Attempt to Quit: 05/14/2012  . Smokeless tobacco: Never Used     Comment: Approx 90 pk-yrs (up to 3ppd until ~ 2009). Smoking 5 cigs per day now.  . Alcohol Use: No  . Drug Use: No  . Sexually Active: Not on file   Other Topics Concern  . Not on file   Social History Narrative   From [place], [place].  Moved to [place] about 8 years ago to be closer to her daughter. She moved back to [place] about a year and a half ago. She lives by herself. She does not routinely exercise or adhere to any particular diet.    Review of Systems  General:  No chills, fever, night sweats or weight changes.  Cardiovascular: +++ chest pain as outlined above.  She has chronic dyspnea on exertion, which acutely worsened prior to admission.  No edema, orthopnea, palpitations, paroxysmal nocturnal dyspnea. Dermatological: No rash, lesions/masses Respiratory: +++ productive cough and dyspnea. Urologic: No hematuria, dysuria Abdominal:   No nausea, vomiting, diarrhea, bright red blood per rectum,  melena, or hematemesis Neurologic:  No visual changes, wkns, changes in mental status. All other systems reviewed and are otherwise negative except as noted above.  Physical Exam  Blood pressure 164/86, pulse 91, temperature 97.8 F (36.6 C), temperature source Oral, resp. rate 20, height 5\' 1"  (1.549 m), weight 210 lb 12.2 oz (95.6 kg), SpO2 98.00%.  General: Pleasant, NAD Psych: Normal affect. Neuro: Alert and oriented X 3. Moves all extremities spontaneously. HEENT: Normal  Neck: Supple without bruits.  JVD difficult to assess 2/2 girth. Lungs:  Resp regular and unlabored, CTA. Heart: RRR no s3, s4, or murmurs. Abdomen: Soft, non-tender, non-distended, BS + x 4.   Extremities: No clubbing, cyanosis or edema. DP/PT/Radials 2+ and equal bilaterally.  Labs  Lab Results  Component Value Date   WBC 17.4* 01/19/2013   HGB 12.8 01/19/2013   HCT 35.1* 01/19/2013   MCV 82.8 01/19/2013   PLT 278 01/19/2013     Recent Labs Lab 01/18/13 0540 01/19/13 0546  NA 137 137  K 4.3 4.5  CL 96 98  CO2 29 26  BUN 19 22  CREATININE 0.85 0.78  CALCIUM 9.5 9.5  PROT 7.9  --   BILITOT 0.2*  --   ALKPHOS 83  --   ALT 84*  --   AST 21  --   GLUCOSE 346* 220*   Lab Results  Component Value Date   DDIMER 0.97* 12/26/2011    Radiology/Studies  US Abdomen Complete  01/15/2013  *RADIOLOGY REPORT*  Clinical Data:  Elevated LFTs, history hypertension, COPD, GERD, cancer smoking, borderline diabetes  ULTRASOUND ABDOMEN:  Technique:  Sonography of upper abdominal structures was performed.  Comparison:  None  Gallbladder:  Normally distended without stones or wall thickening. No pericholecystic fluid or sonographic Murphy sign.  Common bile duct:  Normal caliber 4 mm diameter  Liver:  Echogenic, likely fatty infiltration, though this can be seen with cirrhosis and certain infiltrative disorders.  No gross hepatic mass or nodularity, though intrahepatic visualization is severely limited by poor sound  transmission through echogenic parenchyma.  Hepatopetal portal venous flow.  IVC:  Normal appearance  Pancreas:  Small portion of pancreatic body normal appearance, remainder obscured by combination of bowel gas, increased hepatic echogenicity and body habitus.  Spleen:  Normal morphology, 8.0 cm length  Right kidney:  11.0 cm length. Normal morphology without mass or hydronephrosis.  Left kidney:  10.5 cm length. Normal morphology without mass or hydronephrosis.  Aorta:  Significant portions obscured by bowel gas.  Proximally normal caliber.  Other:  No free fluid  IMPRESSION: Incomplete visualization of pancreas and liver as above. Probable fatty infiltration of liver. If better intrahepatic visualization is required, recommend CT or MR imaging with contrast.   Original Report Authenticated By: Ulyses Southward, M.D.    Dg Chest Port 1 View  01/16/2013  *RADIOLOGY REPORT*  Clinical Data: Shortness of breath, COPD/CHF exacerbation  PORTABLE CHEST - 1 VIEW  Comparison: 01/15/2013  Findings: Increased interstitial markings, possibly chronic.  No frank interstitial edema.  Mild bibasilar opacities, likely atelectasis.  No pleural effusion or pneumothorax.  Mild cardiomegaly.  IMPRESSION: Mild cardiomegaly.  No frank interstitial edema.  Mild bibasilar opacities, likely atelectasis.   Original Report Authenticated By: Charline Bills, M.D.    2D Echocardiogram 01/15/2013  Study Conclusions  Left ventricle: The cavity size was normal. Wall thickness was increased in a pattern of moderate LVH. Systolic function was vigorous. The estimated ejection fraction was in the range of 65% to 70%. Wall motion was normal; there were no regional wall motion abnormalities. _____________  ECG  Rsr, 86, no acute st/t changes.  ASSESSMENT AND PLAN  1.  Exertional chest discomfort:  Pt reports a 1 yr h/o intermittent exertional chest discomfort with radiation of discomfort down the left arm, which has been more frequent  during this hospitalization.  Ss occur only when O2 is not being worn.  She had similar Ss about 4 yrs ago and was evaluated @ [facility] in [place] with stress  testing and cath, which she says showed normal coronary arteries.  She has persistent risk factors for CAD including obesity, htn, tobacco abuse, and diabetes (listed as borderline with markedly elevated sugars in setting of steroids here).  Recommend outpatient cardiology f/u after recovery from acute respiratory illness @ which time we can consider further ischemic evaluation.  Will try and obtain records from [facility].  2.  Acute on chronic respiratory failure:  Abx, inhalers, steroids per IM.  She is on triamterene, HCTZ, and IV lasix (20mg  daily).  ProBNP has been normal.  D/C IV lasix.    3.  HTN:  BP up this AM and has been running in 130-140 range otherwise while on IV lasix.  In setting of LVH along with diabetes, she would benefit from acei/arb therapy.  With #2, would not use ACEI.  Consider starting losartan 25mg  daily.  4.  DM:  Steroids presently driving hyperglycemia.  Per IM.  5.  Leukocytosis: in setting of steroids.  6.  Tobacco Abuse:  Cessation advised.  Signed, Nicolasa Ducking, NP 01/19/2013, 12:29 PM  Patient seen and examined, and situation examined.  She has had intermitted chest discomfort over the years.  It has been intermittent, and occurs centrally and down the left arm.  It is not reliabe in recurring.  She had similar findings at [facility] 4 years ago and they did a cath and told her that her heart looked "great".  It is not clearly associated with exertion, but can occur with exertion when she takes her oxygen off.  She was admitted with acute on chronic respiratory failure, and has a sickle trait.  Unfortunately she continues to smoke.  Exam remarkable for prolonged expiration and mild wheezes.  She coughs throughout the exam.  Cor regular.  No LE edema.   ECG  NSR with some delay R wave  progression, no acute changes.  CXR shows increased interstitial markings.  1.  Would attempt to resolve pulm issue at this time.  2. STRONGLY encourage dc of smoking---she has heard this before.  3.  Obtain records from [facility] in [place]----While at risk if she had normal cath 4 years ago then less likely to be cardiac in etiology  4.  Would defer stress testing at present as she is not a good candidate for testing.  Last test led to unremarkable cath per patient. 5.  If symptoms persist when pulmonary issues settle down, then consider recath.     Reviewed with patient and she is agreeable to this plan.

## 2013-01-19 NOTE — Consult Note (Signed)
Reason for Consult: Buttocks and labia abscess  Referring Physician: Dr. Marcellus Scott  Stephanie Schultz is an 57 y.o. female.  HPI: 57 year old African American female admitted with COPD and worsening shortness of breath and wheezing.She was hospitalized 01/15/13, for these symptoms. Over the last few days she's had some discomfort on her buttock and right labia. She actually has some drainage from one side on the buttock. We were asked to see to evaluate the sites. She is on steroids and insulin for her COPD and diabetes.  Past Medical History  Diagnosis Date  . Borderline diabetes   . Hypertension   . Anxiety   . Emphysema   . COPD (chronic obstructive pulmonary disease)   . OSA on CPAP     CPAP at night   . Dizziness     pt believes this is motion sickness or vertigo  . GERD (gastroesophageal reflux disease)   . Headache   . Cancer 1990    cervical   . Anemia    Body mass index is 39.8   . Shortness of breath     Past Surgical History  Procedure Laterality Date  . Tubal ligation    . Cardiac catheterization    . Colonoscopy  09/05/2012    Procedure: COLONOSCOPY;  Surgeon: Theda Belfast, MD;  Location: [facility];  Service: Endoscopy;  Laterality: N/A;    Family History  Problem Relation Age of Onset  . Lung cancer Paternal Aunt   . Lung cancer Paternal Grandfather     Social History:  reports that she has been smoking Cigarettes.  She has a 36 pack-year smoking history. She has never used smokeless tobacco. She reports that she does not drink alcohol or use illicit drugs.  Allergies: No Known Allergies  Medications:   Prior to Admission:  Prescriptions prior to admission  Medication Sig Dispense Refill  . albuterol (PROVENTIL HFA;VENTOLIN HFA) 108 (90 BASE) MCG/ACT inhaler Inhale 2 puffs into the lungs every 4 (four) hours as needed for wheezing.  1 Inhaler  3  . albuterol (PROVENTIL) (2.5 MG/3ML) 0.083% nebulizer solution Take 2.5 mg by nebulization 4 (four)  times daily as needed. DX: 496. For wheezing      . aspirin EC 81 MG tablet Take 81 mg by mouth daily.      . budesonide-formoterol (SYMBICORT) 160-4.5 MCG/ACT inhaler Inhale 2 puffs into the lungs 2 (two) times daily.  1 Inhaler  3  . Cyanocobalamin (VITAMIN B-12) 500 MCG SUBL Place 1 tablet under the tongue daily.      . fluticasone (FLONASE) 50 MCG/ACT nasal spray Place 2 sprays into the nose 2 (two) times daily.  16 g  11  . loratadine (CLARITIN) 10 MG tablet take 1 tablet by mouth once daily  30 tablet  5  . omeprazole (PRILOSEC) 20 MG capsule Take 20 mg by mouth 2 (two) times daily.      . potassium chloride SA (K-DUR,KLOR-CON) 20 MEQ tablet Take 20 mEq by mouth 2 (two) times daily.      . sodium chloride (OCEAN) 0.65 % nasal spray Place 1 spray into the nose at bedtime as needed. For nasal congestion      . SPIRIVA HANDIHALER 18 MCG inhalation capsule inhale contents of 1 capsule by mouth once daily  30 each  5  . triamterene-hydrochlorothiazide (DYAZIDE) 37.5-25 MG per capsule take 1 capsule by mouth once daily  30 capsule  3   Scheduled: . albuterol  2.5 mg Nebulization Q6H  .  amitriptyline  100 mg Oral QHS  . aspirin EC  81 mg Oral Daily  . azithromycin  500 mg Oral Daily  . budesonide-formoterol  2 puff Inhalation BID  . clotrimazole  1 Applicatorful Vaginal QHS  . docusate sodium  100 mg Oral BID  . enoxaparin (LOVENOX) injection  40 mg Subcutaneous Q24H  . fluticasone  2 spray Each Nare BID  . furosemide  20 mg Intravenous Daily  . insulin aspart  0-15 Units Subcutaneous TID WC  . insulin aspart  0-5 Units Subcutaneous QHS  . insulin aspart  4 Units Subcutaneous TID WC  . insulin glargine  15 Units Subcutaneous QHS  . loratadine  10 mg Oral Daily  . pantoprazole  40 mg Oral Daily  . potassium chloride SA  20 mEq Oral BID  . predniSONE  50 mg Oral Q breakfast  . sodium chloride  3 mL Intravenous Q12H  . sodium chloride  3 mL Intravenous Q12H  . tiotropium  18 mcg  Inhalation Daily  . triamterene-hydrochlorothiazide  1 each Oral Daily   Continuous:  ZOX:WRUEAV chloride, acetaminophen, albuterol, morphine injection, ondansetron (ZOFRAN) IV, ondansetron, sodium chloride, sodium chloride, traMADol Anti-infectives   Start     Dose/Rate Route Frequency Ordered Stop   01/16/13 1000  azithromycin (ZITHROMAX) tablet 500 mg     500 mg Oral Daily 01/15/13 1732     01/15/13 0730  azithromycin (ZITHROMAX) 500 mg in dextrose 5 % 250 mL IVPB  Status:  Discontinued     500 mg 250 mL/hr over 60 Minutes Intravenous Every 24 hours 01/15/13 0644 01/15/13 1732      Results for orders placed during the hospital encounter of 01/15/13 (from the past 48 hour(s))  PRO B NATRIURETIC PEPTIDE     Status: None   Collection Time    01/17/13  2:10 PM      Result Value Range   Pro B Natriuretic peptide (BNP) 103.2  0 - 125 pg/mL  GLUCOSE, CAPILLARY     Status: Abnormal   Collection Time    01/17/13  4:26 PM      Result Value Range   Glucose-Capillary 268 (*) 70 - 99 mg/dL   Comment 1 Notify RN    GLUCOSE, CAPILLARY     Status: Abnormal   Collection Time    01/17/13  9:21 PM      Result Value Range   Glucose-Capillary 275 (*) 70 - 99 mg/dL  GLUCOSE, CAPILLARY     Status: Abnormal   Collection Time    01/18/13  4:45 AM      Result Value Range   Glucose-Capillary 327 (*) 70 - 99 mg/dL  COMPREHENSIVE METABOLIC PANEL     Status: Abnormal   Collection Time    01/18/13  5:40 AM      Result Value Range   Sodium 137  135 - 145 mEq/L   Potassium 4.3  3.5 - 5.1 mEq/L   Chloride 96  96 - 112 mEq/L   CO2 29  19 - 32 mEq/L   Glucose, Bld 346 (*) 70 - 99 mg/dL   BUN 19  6 - 23 mg/dL   Creatinine, Ser 4.09  0.50 - 1.10 mg/dL   Calcium 9.5  8.4 - 81.1 mg/dL   Total Protein 7.9  6.0 - 8.3 g/dL   Albumin 3.8  3.5 - 5.2 g/dL   AST 21  0 - 37 U/L   ALT 84 (*) 0 - 35 U/L  Alkaline Phosphatase 83  39 - 117 U/L   Total Bilirubin 0.2 (*) 0.3 - 1.2 mg/dL   GFR calc non Af Amer  76 (*) >90 mL/min   GFR calc Af Amer 88 (*) >90 mL/min   Comment:            The eGFR has been calculated     using the CKD EPI equation.     This calculation has not been     validated in all clinical     situations.     eGFR's persistently     <90 mL/min signify     possible Chronic Kidney Disease.  GLUCOSE, CAPILLARY     Status: Abnormal   Collection Time    01/18/13 11:50 AM      Result Value Range   Glucose-Capillary 294 (*) 70 - 99 mg/dL   Comment 1 Notify RN    GLUCOSE, CAPILLARY     Status: Abnormal   Collection Time    01/18/13  5:16 PM      Result Value Range   Glucose-Capillary 247 (*) 70 - 99 mg/dL   Comment 1 Documented in Chart    GLUCOSE, CAPILLARY     Status: Abnormal   Collection Time    01/18/13  9:13 PM      Result Value Range   Glucose-Capillary 359 (*) 70 - 99 mg/dL  BASIC METABOLIC PANEL     Status: Abnormal   Collection Time    01/19/13  5:46 AM      Result Value Range   Sodium 137  135 - 145 mEq/L   Potassium 4.5  3.5 - 5.1 mEq/L   Chloride 98  96 - 112 mEq/L   CO2 26  19 - 32 mEq/L   Glucose, Bld 220 (*) 70 - 99 mg/dL   BUN 22  6 - 23 mg/dL   Creatinine, Ser 2.95  0.50 - 1.10 mg/dL   Calcium 9.5  8.4 - 62.1 mg/dL   GFR calc non Af Amer >90  >90 mL/min   GFR calc Af Amer >90  >90 mL/min   Comment:            The eGFR has been calculated     using the CKD EPI equation.     This calculation has not been     validated in all clinical     situations.     eGFR's persistently     <90 mL/min signify     possible Chronic Kidney Disease.  CBC     Status: Abnormal   Collection Time    01/19/13  5:46 AM      Result Value Range   WBC 17.4 (*) 4.0 - 10.5 K/uL   RBC 4.24  3.87 - 5.11 MIL/uL   Hemoglobin 12.8  12.0 - 15.0 g/dL   HCT 30.8 (*) 65.7 - 84.6 %   MCV 82.8  78.0 - 100.0 fL   MCH 30.2  26.0 - 34.0 pg   MCHC 36.5 (*) 30.0 - 36.0 g/dL   RDW 96.2  95.2 - 84.1 %   Platelets 278  150 - 400 K/uL  GLUCOSE, CAPILLARY     Status: Abnormal    Collection Time    01/19/13  5:53 AM      Result Value Range   Glucose-Capillary 212 (*) 70 - 99 mg/dL  GLUCOSE, CAPILLARY     Status: Abnormal   Collection Time    01/19/13 11:13 AM  Result Value Range   Glucose-Capillary 302 (*) 70 - 99 mg/dL   Comment 1 Notify RN      No results found.   Review of Systems  All other systems reviewed and are negative.  Ongoing SOB from COPD Blood pressure 164/86, pulse 91, temperature 97.8 F (36.6 C), temperature source Oral, resp. rate 20, height 5\' 1"  (1.549 m), weight 210 lb 12.2 oz (95.6 kg), SpO2 98.00%. Physical Exam  Constitutional: She is oriented to person, place, and time. She appears well-developed.  Overweight, dyspneic, no acute distress.  HENT:  Head: Normocephalic.  Nose: Nose normal.  Eyes: Conjunctivae are normal. Pupils are equal, round, and reactive to light. Right eye exhibits no discharge. Left eye exhibits no discharge.  Neck: Normal range of motion. Neck supple. No JVD present. No tracheal deviation present. No thyromegaly present.  Cardiovascular: Normal rate and regular rhythm.   Respiratory: No respiratory distress. She has no wheezes. She has no rales.  On O2 dyspneic with movement.  GI: Soft. Bowel sounds are normal. She exhibits no distension and no mass. There is no tenderness. There is no rebound and no guarding.  Musculoskeletal: Normal range of motion. She exhibits no edema.  Lymphadenopathy:    She has no cervical adenopathy.  Neurological: She is alert and oriented to person, place, and time. No cranial nerve deficit.  Skin:     Psychiatric: She has a normal mood and affect. Her behavior is normal. Judgment and thought content normal.    Assessment/Plan: 1. Small abscess left buttocks and right inferior labia. 2. COPD with acute on chronic respiratory failure 3. Type 2 diabetes 4. Hypertension 5. Obstructive sleep apnea with CPAP 6. Fatty liver on abdominal ultrasound 7. BMI 39.8  Plan: I  would recommend he local warm packs and sitz bath, with good hygiene.  If her sugars are well-controlled these should improve with local care.  Will [facility] physician assistant for Dr. Axel Filler.  JENNINGS,WILLARD 01/19/2013, 11:40 AM

## 2013-01-19 NOTE — Progress Notes (Signed)
SATURATION QUALIFICATIONS: (This note is used to comply with regulatory documentation for home oxygen)  Patient Saturations on Room Air at Res99 %  Patient Saturations on Room Air while Ambulating = 87%  Patient Saturations on 3 Liters of oxygen while Ambulating =95% Please briefly explain why patient needs home oxygen: desats while walking . With accompanying sob . Recovered O2v sat when applied

## 2013-01-19 NOTE — Progress Notes (Addendum)
[facility] PROGRESS NOTE  Stephanie Schultz ZOX:[id number] DOB: 1955-12-21 DOA: 01/15/2013 PCP: Dorrene German, MD  Brief narrative 57 y.o. female with hx of severe COPD and asthma, one home oxygen of 2 L [place], obesity, sleep apnea on 10cm CPAP under the care of Dr Delton Coombes, HTN, GERD, presents to the ER with 3 days hx of increase white sputum productive cough, increase shortness of breath and wheezing, unrelieved with nebulizer at home. She denied any fever or chills, chest pain only with coughing. Evaluation in the ER included a CXR although without infiltrate, does have interstitial edema, a normal WBC, normal renal fx tests. She was given several nebs, IV solumedrol in the ambulance, hospitalist was asked to admit her for COPD exacerbation. I noted she has been on PO levoquin. She also has vague and mild abdominal discomfort. There has been no nausea or vomiting. She was noted to have elevated LFTs but not alkaline phosphatase.    Assessment/Plan: 1. Acute on chronic respiratory failure: Secondary to COPD,BNP is normal. Improved. Ambulate patient and see how she does today.  2. Acute exacerbation of COPD: Improved. Patient indicates that her breathing is almost back to baseline. We'll change steroids to by mouth and taper. 3. Possible acute diastolic CHF: Echo shows moderate LVH and LVEF 65-70%. BNP is normal. TC Lasix. Continue Maxzide. 4. Type II DM: Uncontrolled secondary steroids. Rapidly taper steroids. Continue bedtime Lantus and sliding scale insulin. Should improve with tapering steroids. 5. Chest pain: Patient complains of precordial/substernal chest pain with radiation to left arm intermittently, worse when she has worsening dyspnea or with activity. Resolved after resting for a couple of minutes. Cardiac cath 4 years ago in [place] said to be normal. [facility] cardiology consulted. 6. Small abscess right buttock and left labia: Surgery consulted.                     7. Hypertension: BP stable.   8. OSA: Continue  CPAP at night.  9. Abdominal pain: Fatty liver on ultrasound,  May be stretching of liver capsule. LFT's have almost normalized. 10. Anemia: Possibly chronic. Stable 11. Tobacco abuse: Cessation counseled. 12. Leukocytosis: Most likely secondary to steroids.  Code Status: Full Family Communication: Discussed with patient. Disposition Plan: Home when medically stable. Not stable for discharge.   Consultants:  [place] cardiology  General surgery  Procedures:  None  Antibiotics:  IV azithromycin 3/6 >   HPI/Subjective: Complaints of boils in her vaginal and buttock region-? Draining/bleeding. Intermittent chest pain radiating to left arm-not new but has been having couple of episodes in the hospital.  Objective: Filed Vitals:   01/19/13 0204 01/19/13 0608 01/19/13 0830 01/19/13 1429  BP:  164/86  147/76  Pulse:  91  109  Temp:  97.8 F (36.6 C)  97.5 F (36.4 C)  TempSrc:  Oral  Oral  Resp:  20  20  Height:      Weight:  95.6 kg (210 lb 12.2 oz)    SpO2: 97% 95% 98% 95%    Intake/Output Summary (Last 24 hours) at 01/19/13 1509 Last data filed at 01/19/13 1427  Gross per 24 hour  Intake   1420 ml  Output   2850 ml  Net  -1430 ml   Filed Weights   01/17/13 0506 01/18/13 0533 01/19/13 0608  Weight: 95.255 kg (210 lb) 91.672 kg (202 lb 1.6 oz) 95.6 kg (210 lb 12.2 oz)    Exam:   General exam: Comfortable. Morbidly obese  Respiratory system: Much improved. Clear to auscultation. No increased work of breathing. Able to speak in full sentences.   Cardiovascular system: S1 & S2 heard, RRR. No JVD, murmurs, gallops, clicks but has trace bilateral leg edema.   Gastrointestinal system: Abdomen is nondistended, soft and nontender. Normal bowel sounds heard.   Central nervous system: Alert and oriented. No focal neurological deficits.   Extremities: Symmetric 5 x 5 power  Skin: Patient examined with female nurse chaperone in the room. Patient  has approximately 1 cm diameter area of induration, pointing, tenderness and fluctuance in the left gluteal region. She has a larger area of induration in the left external labial fold without drainage or open wound.   Data Reviewed: Basic Metabolic Panel:  Recent Labs Lab 01/15/13 0245 01/15/13 0305 01/16/13 0618 01/18/13 0540 01/19/13 0546  NA 140 144 142 137 137  K 3.7 3.8 3.6 4.3 4.5  CL 106 107 102 96 98  CO2 26  --  29 29 26   GLUCOSE 137* 134* 107* 346* 220*  BUN 11 10 15 19 22   CREATININE 0.85 0.80 1.00 0.85 0.78  CALCIUM 9.2  --  9.3 9.5 9.5   Liver Function Tests:  Recent Labs Lab 01/15/13 0245 01/16/13 0618 01/18/13 0540  AST 188* 49* 21  ALT 210* 138* 84*  ALKPHOS 43 35* 83  BILITOT 0.3 0.3 0.2*  PROT 7.4 7.3 7.9  ALBUMIN 3.5 3.5 3.8   No results found for this basename: LIPASE, AMYLASE,  in the last 168 hours No results found for this basename: AMMONIA,  in the last 168 hours CBC:  Recent Labs Lab 01/15/13 0245 01/15/13 0305 01/16/13 0618 01/19/13 0546  WBC 7.7  --  11.5* 17.4*  NEUTROABS 6.5  --   --   --   HGB 11.4* 11.2* 11.5* 12.8  HCT 31.4* 33.0* 32.7* 35.1*  MCV 82.4  --  83.6 82.8  PLT 199  --  249 278   Cardiac Enzymes: No results found for this basename: CKTOTAL, CKMB, CKMBINDEX, TROPONINI,  in the last 168 hours BNP (last 3 results)  Recent Labs  01/17/13 1410  PROBNP 103.2   CBG:  Recent Labs Lab 01/18/13 1150 01/18/13 1716 01/18/13 2113 01/19/13 0553 01/19/13 1113  GLUCAP 294* 247* 359* 212* 302*    No results found for this or any previous visit (from the past 240 hour(s)).   Studies: No results found. 2-D echo 01/15/13 Study Conclusions  Left ventricle: The cavity size was normal. Wall thickness was increased in a pattern of moderate LVH. Systolic function was vigorous. The estimated ejection fraction was in the range of 65% to 70%. Wall motion was normal; there were no regional wall motion  abnormalities.    Additional labs:   Scheduled Meds: . albuterol  2.5 mg Nebulization Q6H  . amitriptyline  100 mg Oral QHS  . aspirin EC  81 mg Oral Daily  . azithromycin  500 mg Oral Daily  . budesonide-formoterol  2 puff Inhalation BID  . clotrimazole  1 Applicatorful Vaginal QHS  . docusate sodium  100 mg Oral BID  . enoxaparin (LOVENOX) injection  40 mg Subcutaneous Q24H  . fluticasone  2 spray Each Nare BID  . insulin aspart  0-15 Units Subcutaneous TID WC  . insulin aspart  0-5 Units Subcutaneous QHS  . insulin aspart  4 Units Subcutaneous TID WC  . insulin glargine  15 Units Subcutaneous QHS  . loratadine  10 mg Oral Daily  .  pantoprazole  40 mg Oral Daily  . potassium chloride SA  20 mEq Oral BID  . predniSONE  40 mg Oral Q breakfast  . sodium chloride  3 mL Intravenous Q12H  . sodium chloride  3 mL Intravenous Q12H  . tiotropium  18 mcg Inhalation Daily  . triamterene-hydrochlorothiazide  1 each Oral Daily   Continuous Infusions:   Principal Problem:   Respiratory failure, acute-on-chronic Active Problems:   COPD with exacerbation   Diabetes mellitus   Hypertension   Obstructive sleep apnea   Tobacco abuse   Chest pain   Sickle cell trait   Morbid obesity    Time spent: 30 minutes    [facility]  [facility] Pager [phone].   If 8PM-8AM, please contact night-coverage at [email or web address], password [facility] 01/19/2013, 3:09 PM  LOS: 4 days

## 2013-01-19 NOTE — Consult Note (Signed)
I have seen and examined the patient and agree with PA Marlyne Beards note. Would treat with warm compresses at this time and Bactrim. No surgical needs at this time Call us if we help./

## 2013-01-20 ENCOUNTER — Telehealth: Payer: Self-pay | Admitting: Emergency Medicine

## 2013-01-20 LAB — GLUCOSE, CAPILLARY
Glucose-Capillary: 113 mg/dL — ABNORMAL HIGH (ref 70–99)
Glucose-Capillary: 111 mg/dL — ABNORMAL HIGH (ref 70–99)
Glucose-Capillary: 139 mg/dL — ABNORMAL HIGH (ref 70–99)

## 2013-01-20 MED ORDER — CLOTRIMAZOLE 1 % VA CREA: 1.0000 | TOPICAL_CREAM | Freq: Every day | VAGINAL | Status: DC

## 2013-01-20 MED ORDER — SULFAMETHOXAZOLE-TRIMETHOPRIM 800-160 MG PO TABS
1.0000 | ORAL_TABLET | Freq: Two times a day (BID) | ORAL | Status: DC
Start: 2013-01-20 — End: 2013-02-26

## 2013-01-20 MED ORDER — PREDNISONE 10 MG PO TABS: ORAL_TABLET | ORAL | Status: DC

## 2013-01-20 NOTE — Telephone Encounter (Signed)
[email or web address] -- does pt know who called her?

## 2013-01-20 NOTE — Progress Notes (Signed)
Patient Name: Stephanie Schultz Date of Encounter: 01/20/2013     Principal Problem:   Respiratory failure, acute-on-chronic Active Problems:   COPD with exacerbation   Diabetes mellitus   Hypertension   Obstructive sleep apnea   Tobacco abuse   Chest pain   Sickle cell trait   Morbid obesity    SUBJECTIVE  She is doing much better.  No chest pain.  This has resolved.  She and I discussed.  Awaiting records from [facility].    CURRENT MEDS . albuterol  2.5 mg Nebulization QID  . amitriptyline  100 mg Oral QHS  . aspirin EC  81 mg Oral Daily  . azithromycin  500 mg Oral Daily  . budesonide-formoterol  2 puff Inhalation BID  . clotrimazole  1 Applicatorful Vaginal QHS  . docusate sodium  100 mg Oral BID  . enoxaparin (LOVENOX) injection  40 mg Subcutaneous Q24H  . fluticasone  2 spray Each Nare BID  . insulin aspart  0-15 Units Subcutaneous TID WC  . insulin aspart  0-5 Units Subcutaneous QHS  . insulin aspart  4 Units Subcutaneous TID WC  . insulin glargine  15 Units Subcutaneous QHS  . loratadine  10 mg Oral Daily  . pantoprazole  40 mg Oral Daily  . potassium chloride SA  20 mEq Oral BID  . predniSONE  40 mg Oral Q breakfast  . sodium chloride  3 mL Intravenous Q12H  . sodium chloride  3 mL Intravenous Q12H  . tiotropium  18 mcg Inhalation Daily  . triamterene-hydrochlorothiazide  1 each Oral Daily    OBJECTIVE  Filed Vitals:   01/19/13 1429 01/19/13 2049 01/20/13 0610 01/20/13 0834  BP: 147/76 135/65 120/93   Pulse: 109 96 86   Temp: 97.5 F (36.4 C) 98 F (36.7 C) 97.5 F (36.4 C)   TempSrc: Oral Oral Oral   Resp: 20 20 20    Height:      Weight:   209 lb 11.2 oz (95.119 kg)   SpO2: 95% 92% 95% 94%    Intake/Output Summary (Last 24 hours) at 01/20/13 1432 Last data filed at 01/20/13 0924  Gross per 24 hour  Intake    650 ml  Output    301 ml  Net    349 ml   Filed Weights   01/18/13 0533 01/19/13 0608 01/20/13 0610  Weight: 202 lb 1.6 oz  (91.672 kg) 210 lb 12.2 oz (95.6 kg) 209 lb 11.2 oz (95.119 kg)    PHYSICAL EXAM  General: Pleasant, NAD. Neuro: Alert and oriented X 3. Moves all extremities spontaneously. Psych: Normal affect. HEENT:  Normal  Neck: Supple without bruits or JVD. Lungs:  Mild wheezing and prolonged expiration.   Heart: RRR no s3, s4, or murmurs. Extremities: No clubbing, cyanosis or edema. DP/PT/Radials 2+ and equal bilaterally.  Accessory Clinical Findings  CBC  Recent Labs  01/19/13 0546  WBC 17.4*  HGB 12.8  HCT 35.1*  MCV 82.8  PLT 278   Basic Metabolic Panel  Recent Labs  01/18/13 0540 01/19/13 0546  NA 137 137  K 4.3 4.5  CL 96 98  CO2 29 26  GLUCOSE 346* 220*  BUN 19 22  CREATININE 0.85 0.78  CALCIUM 9.5 9.5   Liver Function Tests  Recent Labs  01/18/13 0540  AST 21  ALT 84*  ALKPHOS 83  BILITOT 0.2*  PROT 7.9  ALBUMIN 3.8   No results found for this basename: LIPASE, AMYLASE,  in  the last 72 hours Cardiac Enzymes No results found for this basename: CKTOTAL, CKMB, CKMBINDEX, TROPONINI,  in the last 72 hours BNP No components found with this basename: POCBNP,  D-Dimer No results found for this basename: DDIMER,  in the last 72 hours Hemoglobin A1C No results found for this basename: HGBA1C,  in the last 72 hours Fasting Lipid Panel No results found for this basename: CHOL, HDL, LDLCALC, TRIG, CHOLHDL, LDLDIRECT,  in the last 72 hours Thyroid Function Tests No results found for this basename: TSH, T4TOTAL, FREET3, T3FREE, THYROIDAB,  in the last 72 hours   ECHO Study Conclusions  Left ventricle: The cavity size was normal. Wall thickness was increased in a pattern of moderate LVH. Systolic function was vigorous. The estimated ejection fraction was in the range of 65% to 70%. Wall motion was normal; there were no regional wall motion abnormalities.     Radiology/Studies  Dg Chest 2 View  01/15/2013  *RADIOLOGY REPORT*  Clinical Data: Sudden onset  of shortness of breath.  CHEST - 2 VIEW  Comparison: Chest radiograph performed 12/19/2012  Findings: The lungs are well-aerated.  Vascular congestion is noted, with mild bibasilar opacities, raising concern for mild interstitial edema.  Trace fluid is seen tracking along the right minor fissure.  No significant pleural effusion or pneumothorax is seen.  The heart is borderline normal in size.  No acute osseous abnormalities are seen.  IMPRESSION: Vascular congestion, with mild bibasilar opacities, raising concern for mild interstitial edema.   Original Report Authenticated By: Tonia Ghent, M.D.    US Abdomen Complete  01/15/2013  *RADIOLOGY REPORT*  Clinical Data:  Elevated LFTs, history hypertension, COPD, GERD, cancer smoking, borderline diabetes  ULTRASOUND ABDOMEN:  Technique:  Sonography of upper abdominal structures was performed.  Comparison:  None  Gallbladder:  Normally distended without stones or wall thickening. No pericholecystic fluid or sonographic Murphy sign.  Common bile duct:  Normal caliber 4 mm diameter  Liver:  Echogenic, likely fatty infiltration, though this can be seen with cirrhosis and certain infiltrative disorders.  No gross hepatic mass or nodularity, though intrahepatic visualization is severely limited by poor sound transmission through echogenic parenchyma.  Hepatopetal portal venous flow.  IVC:  Normal appearance  Pancreas:  Small portion of pancreatic body normal appearance, remainder obscured by combination of bowel gas, increased hepatic echogenicity and body habitus.  Spleen:  Normal morphology, 8.0 cm length  Right kidney:  11.0 cm length. Normal morphology without mass or hydronephrosis.  Left kidney:  10.5 cm length. Normal morphology without mass or hydronephrosis.  Aorta:  Significant portions obscured by bowel gas.  Proximally normal caliber.  Other:  No free fluid  IMPRESSION: Incomplete visualization of pancreas and liver as above. Probable fatty infiltration of  liver. If better intrahepatic visualization is required, recommend CT or MR imaging with contrast.   Original Report Authenticated By: Ulyses Southward, M.D.    Dg Chest Port 1 View  01/16/2013  *RADIOLOGY REPORT*  Clinical Data: Shortness of breath, COPD/CHF exacerbation  PORTABLE CHEST - 1 VIEW  Comparison: 01/15/2013  Findings: Increased interstitial markings, possibly chronic.  No frank interstitial edema.  Mild bibasilar opacities, likely atelectasis.  No pleural effusion or pneumothorax.  Mild cardiomegaly.  IMPRESSION: Mild cardiomegaly.  No frank interstitial edema.  Mild bibasilar opacities, likely atelectasis.   Original Report Authenticated By: Charline Bills, M.D.     ASSESSMENT AND PLAN  1.  Currently stable for now.  Will continue to observe.  Awaiting fu  data.  She will need OP fu after discharge.   She knows to report recurrent chest pain.    Signed, Shawnie Pons MD, [facility], FSCAI

## 2013-01-20 NOTE — Discharge Summary (Signed)
Physician Discharge Summary  Stephanie Schultz YQM:[id number] DOB: 08-Aug-1956 DOA: 01/15/2013  PCP: Lonia Blood, MD  Admit date: 01/15/2013 Discharge date: 01/20/2013  Time spent: Greater than 30 minutes  Recommendations for Outpatient Follow-up:  1. With Dr. Lonia Blood, PCP in 1 week 2. Follow up results of HbA1C drawn in hospital. 3. Please follow cardiac cath results that were done in [place], 4 years ago. 4. With [facility]  Discharge Diagnoses:  Principal Problem:   Respiratory failure, acute-on-chronic Active Problems:   COPD with exacerbation   Diabetes mellitus   Hypertension   Obstructive sleep apnea   Tobacco abuse   Chest pain   Sickle cell trait   Morbid obesity   Discharge Condition: Improved & Stable  Diet recommendation: Heart Healthy & Diabetic diet.  Filed Weights   01/18/13 0533 01/19/13 0608 01/20/13 0610  Weight: 91.672 kg (202 lb 1.6 oz) 95.6 kg (210 lb 12.2 oz) 95.119 kg (209 lb 11.2 oz)    History of present illness:  57 y.o. female with hx of severe COPD and asthma, one home oxygen of 2 L [place], obesity, sleep apnea on 10cm CPAP under the care of Dr Delton Coombes, HTN, GERD, presents to the ER with 3 days hx of increase white sputum productive cough, increase shortness of breath and wheezing, unrelieved with nebulizer at home. She denied any fever or chills, chest pain only with coughing. Evaluation in the ER included a CXR although without infiltrate, does have interstitial edema, a normal WBC, normal renal fx tests. She was given several nebs, IV solumedrol in the ambulance, hospitalist was asked to admit her for COPD exacerbation. I noted she has been on PO levoquin. She also has vague and mild abdominal discomfort. There has been no nausea or vomiting. She was noted to have elevated LFTs but not alkaline phosphatase.   Hospital Course:  1. Acute on chronic respiratory failure: Felt to be secondary to COPD exacerbation and element of acute diastolic CHF.  She was treated for same and her acute respiratory failure has resolved. Continue home or to a nightly CPAP. 2. Acute exacerbation of COPD: She was treated with azithromycin, bronchodilator nebulizations, oxygen and initially was placed on oral prednisone. She however did not demonstrate adequate response. Prednisone was switched to IV Solu-Medrol. With these measures she has done quite well and her breathing has returned to baseline. Acute exacerbation has resolved. Will complete rapid taper of prednisone. Completed 5 days of antibiotics. Continue home O2. 3. Possible acute diastolic CHF: Echo shows moderate LVH and LVEF 65-70%. BNP is normal. She was briefly placed on Lasix which was discontinued. Continue Maxzide. 4. Type II DM: Apparently diet controlled at home. Here she became hyperglycemic secondary to steroids. Lantus, mealtime and sliding scale insulins were started. Once her steroids were tapered, her hyperglycemia has started to rapidly improve. Will not send her home on any hypoglycemic agents but will rapidly taper her steroids. No A1c in system-requested prior to discharge and can be followed up by PCP. Diabetic diet. May consider medications depending on her A1c. 5. Chest pain: Patient complained of precordial/substernal chest pain with radiation to left arm intermittently, worse when she has worsening dyspnea or with activity. Resolves after resting for a couple of minutes. Cardiac cath 4 years ago in [place] said to be normal. [facility] consulted-they recommended treating acute pulmonary issues, discontinue smoking defer stress testing at present time. If her symptoms persists after resolution of pulmonary issues then may consider recath. Discussed with Dr. Rise Patience  for discharge home and he will arrange for outpatient followup with [facility] cardiology. 6. Small abscess right buttock and left labia: Surgery consulted-recommended warm compresses, oral Bactrim and good hygiene.   7. Hypertension: BP stable.  8. OSA: Continue CPAP at night.  9. Abdominal pain: Fatty liver on ultrasound. LFT's have almost normalized. Outpatient followup as deemed necessary 10. Anemia: Possibly chronic. Stable 11. Tobacco abuse: Cessation counseled. 12. Leukocytosis: Most likely secondary to steroids.   Consultants:  [place] cardiology  General surgery  Procedures:  None   Discharge Exam:  Complaints: Indicates that her breathing is back to her usual. No further chest pains. Vaginal and buttock swellings with minimal drainage and occasional mild pain-better.  Filed Vitals:   01/19/13 1429 01/19/13 2049 01/20/13 0610 01/20/13 0834  BP: 147/76 135/65 120/93   Pulse: 109 96 86   Temp: 97.5 F (36.4 C) 98 F (36.7 C) 97.5 F (36.4 C)   TempSrc: Oral Oral Oral   Resp: 20 20 20    Height:      Weight:   95.119 kg (209 lb 11.2 oz)   SpO2: 95% 92% 95% 94%    General exam: Comfortable. Morbidly obese  Respiratory system: Clear to auscultation. No increased work of breathing. Able to speak in full sentences.  Cardiovascular system: S1 & S2 heard, RRR. No JVD, murmurs, gallops, clicks or pedal edema. Telemetry shows sinus rhythm. Gastrointestinal system: Abdomen is nondistended, soft and nontender. Normal bowel sounds heard.  Central nervous system: Alert and oriented. No focal neurological deficits.  Extremities: Symmetric 5 x 5 power     Discharge Instructions      Discharge Orders   Future Orders Complete By Expires     (HEART FAILURE PATIENTS) Call MD:  Anytime you have any of the following symptoms: 1) 3 pound weight gain in 24 hours or 5 pounds in 1 week 2) shortness of breath, with or without a dry hacking cough 3) swelling in the hands, feet or stomach 4) if you have to sleep on extra pillows at night in order to breathe.  As directed     Call MD for:  difficulty breathing, headache or visual disturbances  As directed     Call MD for:  redness, tenderness, or  signs of infection (pain, swelling, redness, odor or green/yellow discharge around incision site)  As directed     Call MD for:  severe uncontrolled pain  As directed     Call MD for:  temperature >100.4  As directed     Diet - low sodium heart healthy  As directed     Diet Carb Modified  As directed     Discharge instructions  As directed     Comments:      1. Care of buttock & vaginal lesions: local warm packs and sitz bath, with good hygiene.  2. Continue home oxygen at previous settings. 3. Continue nightly CPAP at previous home settings.    Increase activity slowly  As directed         Medication List    TAKE these medications       albuterol 108 (90 BASE) MCG/ACT inhaler  Commonly known as:  PROVENTIL HFA;VENTOLIN HFA  Inhale 2 puffs into the lungs every 4 (four) hours as needed for wheezing.     albuterol (2.5 MG/3ML) 0.083% nebulizer solution  Commonly known as:  PROVENTIL  Take 2.5 mg by nebulization 4 (four) times daily as needed. DX: 496.  For wheezing  aspirin EC 81 MG tablet  Take 81 mg by mouth daily.     budesonide-formoterol 160-4.5 MCG/ACT inhaler  Commonly known as:  SYMBICORT  Inhale 2 puffs into the lungs 2 (two) times daily.     clotrimazole 1 % vaginal cream  Commonly known as:  GYNE-LOTRIMIN  Place 1 Applicatorful vaginally at bedtime. 7 days.     fluticasone 50 MCG/ACT nasal spray  Commonly known as:  FLONASE  Place 2 sprays into the nose 2 (two) times daily.     loratadine 10 MG tablet  Commonly known as:  CLARITIN  take 1 tablet by mouth once daily     omeprazole 20 MG capsule  Commonly known as:  PRILOSEC  Take 20 mg by mouth 2 (two) times daily.     potassium chloride SA 20 MEQ tablet  Commonly known as:  K-DUR,KLOR-CON  Take 20 mEq by mouth 2 (two) times daily.     predniSONE 10 MG tablet  Commonly known as:  DELTASONE  Take 3 tabs daily for 2 days, then 2 tabs daily for 2 days, then 1 tab daily for 2 days, then stop.      sodium chloride 0.65 % nasal spray  Commonly known as:  OCEAN  Place 1 spray into the nose at bedtime as needed. For nasal congestion     SPIRIVA HANDIHALER 18 MCG inhalation capsule  Generic drug:  tiotropium  inhale contents of 1 capsule by mouth once daily     sulfamethoxazole-trimethoprim 800-160 MG per tablet  Commonly known as:  BACTRIM DS  Take 1 tablet by mouth 2 (two) times daily.     triamterene-hydrochlorothiazide 37.5-25 MG per capsule  Commonly known as:  DYAZIDE  take 1 capsule by mouth once daily     Vitamin B-12 500 MCG Subl  Place 1 tablet under the tongue daily.       Follow-up Information   Follow up with [facility], MD. Schedule an appointment as soon as possible for a visit in 1 week.   Contact information:   [address] [place] [address] [phone]        The results of significant diagnostics from this hospitalization (including imaging, microbiology, ancillary and laboratory) are listed below for reference.    Significant Diagnostic Studies: Dg Chest 2 View  01/15/2013  *RADIOLOGY REPORT*  Clinical Data: Sudden onset of shortness of breath.  CHEST - 2 VIEW  Comparison: Chest radiograph performed 12/19/2012  Findings: The lungs are well-aerated.  Vascular congestion is noted, with mild bibasilar opacities, raising concern for mild interstitial edema.  Trace fluid is seen tracking along the right minor fissure.  No significant pleural effusion or pneumothorax is seen.  The heart is borderline normal in size.  No acute osseous abnormalities are seen.  IMPRESSION: Vascular congestion, with mild bibasilar opacities, raising concern for mild interstitial edema.   Original Report Authenticated By: Tonia Ghent, M.D.    US Abdomen Complete  01/15/2013  *RADIOLOGY REPORT*  Clinical Data:  Elevated LFTs, history hypertension, COPD, GERD, cancer smoking, borderline diabetes  ULTRASOUND ABDOMEN:  Technique:  Sonography of upper abdominal structures was  performed.  Comparison:  None  Gallbladder:  Normally distended without stones or wall thickening. No pericholecystic fluid or sonographic Murphy sign.  Common bile duct:  Normal caliber 4 mm diameter  Liver:  Echogenic, likely fatty infiltration, though this can be seen with cirrhosis and certain infiltrative disorders.  No gross hepatic mass or nodularity, though intrahepatic visualization is severely  limited by poor sound transmission through echogenic parenchyma.  Hepatopetal portal venous flow.  IVC:  Normal appearance  Pancreas:  Small portion of pancreatic body normal appearance, remainder obscured by combination of bowel gas, increased hepatic echogenicity and body habitus.  Spleen:  Normal morphology, 8.0 cm length  Right kidney:  11.0 cm length. Normal morphology without mass or hydronephrosis.  Left kidney:  10.5 cm length. Normal morphology without mass or hydronephrosis.  Aorta:  Significant portions obscured by bowel gas.  Proximally normal caliber.  Other:  No free fluid  IMPRESSION: Incomplete visualization of pancreas and liver as above. Probable fatty infiltration of liver. If better intrahepatic visualization is required, recommend CT or MR imaging with contrast.   Original Report Authenticated By: Ulyses Southward, M.D.    Dg Chest Port 1 View  01/16/2013  *RADIOLOGY REPORT*  Clinical Data: Shortness of breath, COPD/CHF exacerbation  PORTABLE CHEST - 1 VIEW  Comparison: 01/15/2013  Findings: Increased interstitial markings, possibly chronic.  No frank interstitial edema.  Mild bibasilar opacities, likely atelectasis.  No pleural effusion or pneumothorax.  Mild cardiomegaly.  IMPRESSION: Mild cardiomegaly.  No frank interstitial edema.  Mild bibasilar opacities, likely atelectasis.   Original Report Authenticated By: Charline Bills, M.D.    2-D echo 01/15/13  Study Conclusions  Left ventricle: The cavity size was normal. Wall thickness was increased in a pattern of moderate LVH. Systolic function  was vigorous. The estimated ejection fraction was in the range of 65% to 70%. Wall motion was normal; there were no regional wall motion abnormalities.    Microbiology: No results found for this or any previous visit (from the past 240 hour(s)).   Labs: Basic Metabolic Panel:  Recent Labs Lab 01/15/13 0245 01/15/13 0305 01/16/13 0618 01/18/13 0540 01/19/13 0546  NA 140 144 142 137 137  K 3.7 3.8 3.6 4.3 4.5  CL 106 107 102 96 98  CO2 26  --  29 29 26   GLUCOSE 137* 134* 107* 346* 220*  BUN 11 10 15 19 22   CREATININE 0.85 0.80 1.00 0.85 0.78  CALCIUM 9.2  --  9.3 9.5 9.5   Liver Function Tests:  Recent Labs Lab 01/15/13 0245 01/16/13 0618 01/18/13 0540  AST 188* 49* 21  ALT 210* 138* 84*  ALKPHOS 43 35* 83  BILITOT 0.3 0.3 0.2*  PROT 7.4 7.3 7.9  ALBUMIN 3.5 3.5 3.8   No results found for this basename: LIPASE, AMYLASE,  in the last 168 hours No results found for this basename: AMMONIA,  in the last 168 hours CBC:  Recent Labs Lab 01/15/13 0245 01/15/13 0305 01/16/13 0618 01/19/13 0546  WBC 7.7  --  11.5* 17.4*  NEUTROABS 6.5  --   --   --   HGB 11.4* 11.2* 11.5* 12.8  HCT 31.4* 33.0* 32.7* 35.1*  MCV 82.4  --  83.6 82.8  PLT 199  --  249 278   Cardiac Enzymes: No results found for this basename: CKTOTAL, CKMB, CKMBINDEX, TROPONINI,  in the last 168 hours BNP: BNP (last 3 results)  Recent Labs  01/17/13 1410  PROBNP 103.2   CBG:  Recent Labs Lab 01/19/13 1113 01/19/13 1609 01/19/13 2209 01/20/13 0622 01/20/13 1104  GLUCAP 302* 183* 197* 113* 111*    Additional labs:  TSH: 0.492  Hepatitis A IgM, hepatitis B surface antigen, hepatitis B C IgM, HCV antibody: Negative  UA: Not suggestive of UTI.     Signed:  HONGALGI,ANAND  [facility] 01/20/2013, 2:01 PM

## 2013-01-20 NOTE — Telephone Encounter (Signed)
According to Melissa: Dr. Delton Coombes sent in order for new CPAP machine for patient in Jan, but [facility] needed to get sleep study from [place] first. On the order placed in Jan 2014 there was no mention of cpap pressure setting. Melissa states they still have not received order so I have placed order to have CPAP set at 10 per Dr. Delton Coombes. (as listed below)  OV on 11/25/12 Patient Instructions    Please restart your Dulera 2 puffs twice a day  Continue your Spiriva  Use your albuterol as needed  Wear your oxygen as prescribed  We will work on getting your CPAP replaced after the company gets your sleep study from [place]. You may need to have a pressure re-titration in the future to insure you are on the correct settings  Follow with Dr Delton Coombes in 3 months or sooner if you have any problems.

## 2013-01-21 LAB — HEMOGLOBIN A1C: Mean Plasma Glucose: 151 mg/dL — ABNORMAL HIGH (ref <117)

## 2013-01-22 NOTE — Telephone Encounter (Signed)
Pt states nothing further needed at this time. Jennifer Castillo, CMA  

## 2013-02-02 ENCOUNTER — Encounter: Payer: Medicaid Other | Admitting: Physician Assistant

## 2013-02-12 ENCOUNTER — Ambulatory Visit (INDEPENDENT_AMBULATORY_CARE_PROVIDER_SITE_OTHER): Payer: Medicaid Other | Admitting: Physician Assistant

## 2013-02-12 ENCOUNTER — Encounter: Payer: Self-pay | Admitting: Physician Assistant

## 2013-02-12 VITALS — BP 114/68 | HR 90 | Ht 61.0 in | Wt 213.0 lb

## 2013-02-12 DIAGNOSIS — R079 Chest pain, unspecified: Secondary | ICD-10-CM

## 2013-02-12 DIAGNOSIS — Z720 Tobacco use: Secondary | ICD-10-CM

## 2013-02-12 DIAGNOSIS — I10 Essential (primary) hypertension: Secondary | ICD-10-CM

## 2013-02-12 DIAGNOSIS — J449 Chronic obstructive pulmonary disease, unspecified: Secondary | ICD-10-CM

## 2013-02-12 DIAGNOSIS — F17200 Nicotine dependence, unspecified, uncomplicated: Secondary | ICD-10-CM

## 2013-02-12 NOTE — Patient Instructions (Addendum)
NO CHANGES WITH MEDICATIONS TODAY  PLEASE FOLLOW UP WITH SCOTT WEAVER, PAC ON 02/26/13 @ 10:30 SAME DAY DR. Riley Kill IS IN THE OFFICE

## 2013-02-12 NOTE — Progress Notes (Signed)
[address] [place], [place]  [address] Phone: [phone] Fax:  [phone]  Date:  02/12/2013   ID:  Stephanie Schultz, DOB 10/17/1956, MRN [id number]  PCP:  Lonia Blood, MD  Primary Cardiologist:  Dr.  Shawnie Pons     History of Present Illness: Stephanie Schultz is a 57 y.o. female who returns for f/u after a recent admission to the hospital for chest pain in the setting of a/c respiratory failure from acute COPD exacerbation +/- acute diastolic CHF.  She was treated with steroids, pulmonary toilet and antibiotics as well as brief course of Lasix.  She has a PMH of DM2, HTN, COPD, OSA, tobacco abuse.  She reported a "normal" cath at [facility] in [place], [place] 4 years ago.  I received a copy of her cath report after the patient left the office.  LHC at [facility] in [place], [place] 09/2008:  Normal coronary arteries EF 70%.  Patient noted exertional chest and left arm squeezing while her O2 was off.  She was seen by cardiology.  Echo 01/15/13:  Mod LVH, EF 65-70%, normal wall motion.  She r/o for MI.  Dr. Riley Kill recommended treating respiratory illness first. It was felt that proceeding with stress testing would not be helpful as it led to a normal cath in the past.  Recommendation was to consider a cath if symptoms persisted.  Patient notes her CP is resolved.  She has occasional brief chest pain at rest.  The chest and left arm squeezing with exertion is gone. She has chronic DOE (NYHA Class III).  She sleeps on 2-3 pillows.  No PND.  She has had some recent hand and ankle edema.  No syncope.  She does get dizzy sometimes.    Labs (3/14):  K 4.5, Cr 0.78, ALT 84, Hgb 12.8, TSH 0.492  Wt Readings from Last 3 Encounters:  02/12/13 213 lb (96.616 kg)  01/20/13 209 lb 11.2 oz (95.119 kg)  12/19/12 212 lb (96.163 kg)     Past Medical History  Diagnosis Date  . Borderline diabetes   . Hypertension   . Anxiety   . Emphysema   . COPD (chronic obstructive pulmonary disease)   . OSA on  CPAP     CPAP at night   . Dizziness     pt believes this is motion sickness or vertigo  . GERD (gastroesophageal reflux disease)   . Headache   . Cancer 1990    cervical   . Anemia   . Morbid obesity   . Sickle cell trait   . Hx of cardiac catheterization     a. LHC at [facility] in [place], [place] 09/2008:  Normal coronary arteries EF 70%.  . Tobacco abuse     a. up to 3ppd from age 41 to 89, now 1/4 ppd (01/2013)    Current Outpatient Prescriptions  Medication Sig Dispense Refill  . albuterol (PROVENTIL HFA;VENTOLIN HFA) 108 (90 BASE) MCG/ACT inhaler Inhale 2 puffs into the lungs every 4 (four) hours as needed for wheezing.  1 Inhaler  3  . albuterol (PROVENTIL) (2.5 MG/3ML) 0.083% nebulizer solution Take 2.5 mg by nebulization 4 (four) times daily as needed. DX: 496. For wheezing      . aspirin EC 81 MG tablet Take 81 mg by mouth daily.      . budesonide-formoterol (SYMBICORT) 160-4.5 MCG/ACT inhaler Inhale 2 puffs into the lungs 2 (two) times daily.  1 Inhaler  3  . clotrimazole (GYNE-LOTRIMIN) 1 % vaginal  cream Place 1 Applicatorful vaginally at bedtime. 7 days.  45 g  0  . Cyanocobalamin (VITAMIN B-12) 500 MCG SUBL Place 1 tablet under the tongue daily.      . fluticasone (FLONASE) 50 MCG/ACT nasal spray Place 2 sprays into the nose 2 (two) times daily.  16 g  11  . loratadine (CLARITIN) 10 MG tablet take 1 tablet by mouth once daily  30 tablet  5  . omeprazole (PRILOSEC) 20 MG capsule Take 20 mg by mouth 2 (two) times daily.      . potassium chloride SA (K-DUR,KLOR-CON) 20 MEQ tablet Take 20 mEq by mouth 2 (two) times daily.      . predniSONE (DELTASONE) 10 MG tablet Take 3 tabs daily for 2 days, then 2 tabs daily for 2 days, then 1 tab daily for 2 days, then stop.  12 tablet  0  . sodium chloride (OCEAN) 0.65 % nasal spray Place 1 spray into the nose at bedtime as needed. For nasal congestion      . SPIRIVA HANDIHALER 18 MCG inhalation capsule inhale contents of 1 capsule by mouth once  daily  30 each  5  . sulfamethoxazole-trimethoprim (BACTRIM DS) 800-160 MG per tablet Take 1 tablet by mouth 2 (two) times daily.  14 tablet  0  . triamterene-hydrochlorothiazide (DYAZIDE) 37.5-25 MG per capsule take 1 capsule by mouth once daily  30 capsule  3   No current facility-administered medications for this visit.    Allergies:   No Known Allergies  Social History:  The patient  reports that she has been smoking Cigarettes.  She has a 108 pack-year smoking history. She has never used smokeless tobacco. She reports that she does not drink alcohol or use illicit drugs.   ROS:  Please see the history of present illness.   She has had some diarrhea recently.    All other systems reviewed and negative.   PHYSICAL EXAM: VS:  BP 114/68  Pulse 90  Ht 5\' 1"  (1.549 m)  Wt 213 lb (96.616 kg)  BMI 40.27 kg/m2 Well nourished, well developed, in no acute distress HEENT: normal Neck: no JVD Cardiac:  normal S1, S2; RRR; no murmur Chest:  Mild tenderness to palpation  Lungs:  Decreased breath sounds bilaterally, no wheezing, rhonchi or rales Abd: soft, nontender, no hepatomegaly Ext: no edema Skin: warm and dry Neuro:  CNs 2-12 intact, no focal abnormalities noted  EKG:  NSR, HR 92, normal axis, NSSTTW changes, no significant change when compare to several prior tracings.      ASSESSMENT AND PLAN:  1. Chest Pain:  Likely related to bronchospasm from recent COPD exacerbation.  Symptoms now improved.  Her cath in 2009 was completely normal.  Could consider repeat functional study if symptoms return.  I will bring her back in close f/u. 2. COPD:  F/u with pulmonary as planned. 3. Hypertension:  Controlled.  Continue current therapy.  4. Tobacco Abuse:  She knows she needs to quit. 5. Disposition:  F/u with me in 2-3 weeks.   [name], PA-C  12:32 PM 02/12/2013

## 2013-02-26 ENCOUNTER — Encounter: Payer: Self-pay | Admitting: Physician Assistant

## 2013-02-26 ENCOUNTER — Ambulatory Visit: Payer: Medicaid Other | Admitting: Emergency Medicine

## 2013-02-26 ENCOUNTER — Ambulatory Visit (INDEPENDENT_AMBULATORY_CARE_PROVIDER_SITE_OTHER): Payer: Medicaid Other | Admitting: Physician Assistant

## 2013-02-26 VITALS — BP 128/76 | HR 99 | Ht 60.0 in | Wt 212.8 lb

## 2013-02-26 DIAGNOSIS — I10 Essential (primary) hypertension: Secondary | ICD-10-CM

## 2013-02-26 DIAGNOSIS — F17200 Nicotine dependence, unspecified, uncomplicated: Secondary | ICD-10-CM

## 2013-02-26 DIAGNOSIS — J449 Chronic obstructive pulmonary disease, unspecified: Secondary | ICD-10-CM

## 2013-02-26 DIAGNOSIS — R079 Chest pain, unspecified: Secondary | ICD-10-CM

## 2013-02-26 DIAGNOSIS — Z720 Tobacco use: Secondary | ICD-10-CM

## 2013-02-26 NOTE — Progress Notes (Signed)
[address], [address] [place], [place]  [address] Phone: [phone] Fax:  [phone]  Date:  02/26/2013   ID:  Stephanie Schultz, DOB Dec 19, 1955, MRN [id number]  PCP:  Lonia Blood, MD  Primary Cardiologist:  Dr.  Shawnie Pons     History of Present Illness: Stephanie Schultz is a 57 y.o. female who returns for f/u.  She was recently admitted to the hospital for chest pain in the setting of a/c respiratory failure from acute COPD exacerbation +/- acute diastolic CHF.   She has a PMH of DM2, HTN, COPD, OSA, tobacco abuse.   LHC at [facility] in [place], [place] 09/2008:  Normal coronary arteries EF 70%.  Patient noted exertional chest and left arm squeezing while her O2 was off and was seen by cardiology.  Echo 01/15/13:  Mod LVH, EF 65-70%, normal wall motion.  Respiratory issues were to be treated first.  Recommendation was to consider a cath if symptoms persisted.  I saw her 4/3 in f/u.  Symptoms were improved.  I had not received her heart cath report yet from [facility].  I had her f/u today to revisit her symptoms.  She feels better.  No chest pain.  Dyspnea is stable.  She does note some sputum production.  No orthopnea, PND.  She has occasional pedal edema.  No syncope.    Labs (3/14):  K 4.5, Cr 0.78, ALT 84, Hgb 12.8, TSH 0.492  Wt Readings from Last 3 Encounters:  02/26/13 212 lb 12.8 oz (96.525 kg)  02/12/13 213 lb (96.616 kg)  01/20/13 209 lb 11.2 oz (95.119 kg)     Past Medical History  Diagnosis Date  . Borderline diabetes   . Hypertension   . Anxiety   . Emphysema   . COPD (chronic obstructive pulmonary disease)   . OSA on CPAP     CPAP at night   . Dizziness     pt believes this is motion sickness or vertigo  . GERD (gastroesophageal reflux disease)   . Headache   . Cancer 1990    cervical   . Anemia   . Morbid obesity   . Sickle cell trait   . Hx of cardiac catheterization     a. LHC at [facility] in [place], [place] 09/2008:  Normal coronary arteries EF 70%.  . Tobacco abuse       a. up to 3ppd from age 58 to 45, now 1/4 ppd (01/2013)    Current Outpatient Prescriptions  Medication Sig Dispense Refill  . albuterol (PROVENTIL HFA;VENTOLIN HFA) 108 (90 BASE) MCG/ACT inhaler Inhale 2 puffs into the lungs every 4 (four) hours as needed for wheezing.  1 Inhaler  3  . albuterol (PROVENTIL) (2.5 MG/3ML) 0.083% nebulizer solution Take 2.5 mg by nebulization 4 (four) times daily as needed. DX: 496. For wheezing      . aspirin EC 81 MG tablet Take 81 mg by mouth daily.      . budesonide-formoterol (SYMBICORT) 160-4.5 MCG/ACT inhaler Inhale 2 puffs into the lungs 2 (two) times daily.  1 Inhaler  3  . clotrimazole (GYNE-LOTRIMIN) 1 % vaginal cream Place 1 Applicatorful vaginally at bedtime. 7 days.  45 g  0  . Cyanocobalamin (VITAMIN B-12) 500 MCG SUBL Place 1 tablet under the tongue daily.      . fluticasone (FLONASE) 50 MCG/ACT nasal spray Place 2 sprays into the nose 2 (two) times daily.  16 g  11  . loratadine (CLARITIN) 10 MG tablet take 1  tablet by mouth once daily  30 tablet  5  . omeprazole (PRILOSEC) 20 MG capsule Take 20 mg by mouth 2 (two) times daily.      . potassium chloride SA (K-DUR,KLOR-CON) 20 MEQ tablet Take 20 mEq by mouth 2 (two) times daily.      . sodium chloride (OCEAN) 0.65 % nasal spray Place 1 spray into the nose at bedtime as needed. For nasal congestion      . SPIRIVA HANDIHALER 18 MCG inhalation capsule inhale contents of 1 capsule by mouth once daily  30 each  5  . triamterene-hydrochlorothiazide (DYAZIDE) 37.5-25 MG per capsule take 1 capsule by mouth once daily  30 capsule  3   No current facility-administered medications for this visit.    Allergies:   No Known Allergies  Social History:  The patient  reports that she has been smoking Cigarettes.  She has a 108 pack-year smoking history. She has never used smokeless tobacco. She reports that she does not drink alcohol or use illicit drugs.   ROS:  Please see the history of present illness.     All other systems reviewed and negative.   PHYSICAL EXAM: VS:  BP 128/76  Pulse 99  Ht 5' (1.524 m)  Wt 212 lb 12.8 oz (96.525 kg)  BMI 41.56 kg/m2 Well nourished, well developed, in no acute distress HEENT: normal Cardiac:  normal S1, S2; RRR; no murmur Lungs:  Decreased breath sounds bilaterally, no wheezing, rhonchi or rales Abd: soft, nontender  Ext: no edema Skin: warm and dry Neuro:  CNs 2-12 intact, no focal abnormalities noted  EKG:  NSR, HR 99, normal axis, NSSTTW changes, no significant change when compare to prior tracings.      ASSESSMENT AND PLAN:  1. Chest Pain:  Likely related to bronchospasm from recent COPD exacerbation.  No further CP.  Reviewed cath report from [facility] in 2009.  She had no CAD.  She can f/u with cardiology prn.   2. COPD:  F/u with pulmonary as planned. 3. Hypertension:  Controlled.  Continue current therapy.  4. Tobacco Abuse:  She knows she needs to quit. 5. Fatty Liver:  I advised her to f/u with her PCP. 6. Disposition:  F/u with cardiology prn.    [name], PA-C  10:43 AM 02/26/2013

## 2013-02-26 NOTE — Patient Instructions (Addendum)
NO CHANGES WERE MADE TODAY  YOU CAN FOLLOW UP AS NEEDED

## 2013-03-11 ENCOUNTER — Encounter: Payer: Self-pay | Admitting: Emergency Medicine

## 2013-03-11 ENCOUNTER — Ambulatory Visit (INDEPENDENT_AMBULATORY_CARE_PROVIDER_SITE_OTHER): Payer: Medicaid Other | Admitting: Emergency Medicine

## 2013-03-11 VITALS — BP 118/82 | HR 103 | Temp 98.1°F | Ht 60.0 in | Wt 216.2 lb

## 2013-03-11 DIAGNOSIS — G4733 Obstructive sleep apnea (adult) (pediatric): Secondary | ICD-10-CM

## 2013-03-11 DIAGNOSIS — J449 Chronic obstructive pulmonary disease, unspecified: Secondary | ICD-10-CM

## 2013-03-11 MED ORDER — ALBUTEROL SULFATE HFA 108 (90 BASE) MCG/ACT IN AERS: 2.0000 | INHALATION_SPRAY | RESPIRATORY_TRACT | Status: DC | PRN

## 2013-03-11 NOTE — Assessment & Plan Note (Signed)
Please continue your current medications Wear your CPAP every night Work on stopping smoking  Follow with Dr Delton Coombes in 2 months or sooner if you have any problems.

## 2013-03-11 NOTE — Progress Notes (Signed)
Subjective:    Patient ID: Stephanie Schultz, female    DOB: 1956-06-19, 57 y.o.   MRN: [id number]  HPI 57 yo smoker (90 pk-yrs, has cut down to about 2 cig a day), hx HTN, borderline DM, allergies. Dx with COPD at [facility] in [place]. She was started on O2 in ~2009. She had PFT within the last year. She remains fairly active as long as she wears her O2. She can walk about 30-40 feet, has to stop when shopping to rest. She has daily cough, productive of white phlegm. She has rare exacerbations, last was in Jan 2012.   ROV 07/05/11 -- COPD, OSA. Her data from [facility] showed she needs CPAP 10. We ordered O2 thru [facility]. She continues to smoke 3 cigs a day. She has been coughing more, having more sputum - yellow/white sputum.  She is having more dyspnea, more wheezing x 3 weeks. She has gained 30+ lbs since coming to [place]. She has not been wearing her O2 reliably.   ROV 09/20/11 -- COPD, OSA. Returns for f/u. Tells me that she has been rx for COPD exacerbation x 2 since our last visit. She tells me that her CXR showed "a spot on one of her lungs". Was done at [facility] on 08/23/11. She was treated with pred and azithro on each occasion. She is on Advair and Spiriva. Continues to have severe cough, costochondritic pain. The pred and azithro helped some but not completely. Ran out loratadine 2 weeks ago. Still on omeprazole bid. She is wearing CPAP 10 reliably.   ROV 10/25/11 -- COPD, OSA on CPAP 10. Last time we started [facility] in place of Advair to see if this helped w UA sx. Still on Spiriva. Restarted loratadine and continued omeprazole bid. She feels that her breathing is the same, cough is a bit better on the [facility]. No flares, but she is still having nasal gtt despite the loratadine.   12/17/2011 Acute OV  Pt presents for an acute office visit, complains of increased SOB, wheezing, prod cough with yellow-to-brown mucus  2 weeks . Went to ED on 1.23.13 and was given zpak and prednisone 50mg  x 4 days. CXR with  chronic changing. Has finished abx and steroids. Got some better but cough never went away. Still smoking 1/2 PPD (takes O2 off -goes outside to smoke) .  Remains on Dulera and Spiriva  With no missed doses.  OTC not helping.  Wants referral for home health.  Encouraged to quit smoking.   ROV 02/18/12 -- COPD, OSA on CPAP, chronic cough. Had an AE with residual cough as described above 2/13. Remains on Spiriva + Dulera. Remains on omeprazole bid, flonase, not using loratadine right now. Still smoking about 3 cig a day. She is interested in trying the nicotine patches  ROV 07/08/12 -- COPD, OSA on CPAP, chronic cough. She reports that she quit smoking July 3! She has gained 60 lbs over the last year. Her breathing is stable, still limited. She is wearing her O2 and her CPAP. Continues to take Spiriva + Dulera. She reports that she is still having GERD sx even on omeprazole bid. She ran out of flonase 1 month ago. She has had some epistaxis on R.   ROV 11/25/12 -- COPD, OSA on CPAP, chronic cough. Since last time she restarted smoking - currently at 5 cig a day. She ran out of her [place] Sleight a week ago. Still taking spiriva. She has been having more cough over about  2 weeks. She has been using albuterol nebs bid - about to run out of it, need refill. She remains on loratadine, fluticasone, omeprazole. She hasn't been using CPAP regularly - needs new mask, this is being arranged. She is to undergo breast bx for a nodule this week.    Post Hospital follow up 12/19/12 Patient returns for post hospital followup. Patient was admitted January 24-27th 2014 for COPD, exacerbation. She was treated with IV antibiotics, nebulized bronchodilators, and IV steroids.  She was discharged on a steroid taper, along with Levaquin.  Since discharge. She has had  some improvement however, continues to have productive cough, with some thick, yellow. Mucus. Unfortunately patient has restarted smoking. Smoking cessation education was  given. Chest x-ray today shows no acute process Patient denies any hemoptysis, orthopnea, PND, or leg swelling.   03/11/13 -- COPD, OSA on CPAP, chronic cough, tobacco use. Admitted 3/6 - 3/11 for AE-COPD. She feels better. She is having some nasal congestion, on loratadine + nasal steroid. ENT saw her and increased her PPI. She is wearing her CPAP.       Objective:   Physical Exam Filed Vitals:   03/11/13 1621  BP: 118/82  Pulse: 103  Temp: 98.1 F (36.7 C)   Gen: Pleasant, obese, in no distress,  normal affect  ENT: No lesions,  mouth clear,  oropharynx clear, no postnasal drip  Neck: No JVD, no TMG, no carotid bruits  Lungs: No use of accessory muscles, distant, coarse BS , no wheezes   Cardiovascular: RRR, heart sounds normal, no murmur or gallops, no peripheral edema  Musculoskeletal: No deformities, no cyanosis or clubbing  Neuro: alert, non focal  Skin: Warm, no lesions or rashes, old scars on B UE's   Assessment & Plan:  COPD (chronic obstructive pulmonary disease) Please continue your current medications Wear your CPAP every night Work on stopping smoking  Follow with Dr Delton Coombes in 2 months or sooner if you have any problems.

## 2013-03-11 NOTE — Patient Instructions (Addendum)
Please continue your current medications Wear your CPAP every night Work on stopping smoking  Follow with Dr Ilyana Manuele in 2 months or sooner if you have any problems. 

## 2013-04-10 ENCOUNTER — Other Ambulatory Visit: Payer: Self-pay | Admitting: Emergency Medicine

## 2013-04-13 ENCOUNTER — Encounter (HOSPITAL_COMMUNITY): Payer: Self-pay | Admitting: *Deleted

## 2013-04-13 ENCOUNTER — Observation Stay (HOSPITAL_COMMUNITY)
Admission: EM | Admit: 2013-04-13 | Discharge: 2013-04-15 | Disposition: A | Discharge status: Home / Self Care | Payer: Medicaid Other | Attending: Internal Medicine | Admitting: Internal Medicine

## 2013-04-13 ENCOUNTER — Emergency Department (HOSPITAL_COMMUNITY): Payer: Medicaid Other

## 2013-04-13 DIAGNOSIS — Z6841 Body Mass Index (BMI) 40.0 and over, adult: Secondary | ICD-10-CM | POA: Insufficient documentation

## 2013-04-13 DIAGNOSIS — K219 Gastro-esophageal reflux disease without esophagitis: Secondary | ICD-10-CM | POA: Insufficient documentation

## 2013-04-13 DIAGNOSIS — J9610 Chronic respiratory failure, unspecified whether with hypoxia or hypercapnia: Secondary | ICD-10-CM | POA: Insufficient documentation

## 2013-04-13 DIAGNOSIS — Z79899 Other long term (current) drug therapy: Secondary | ICD-10-CM | POA: Insufficient documentation

## 2013-04-13 DIAGNOSIS — I10 Essential (primary) hypertension: Secondary | ICD-10-CM | POA: Insufficient documentation

## 2013-04-13 DIAGNOSIS — J449 Chronic obstructive pulmonary disease, unspecified: Secondary | ICD-10-CM

## 2013-04-13 DIAGNOSIS — E119 Type 2 diabetes mellitus without complications: Secondary | ICD-10-CM

## 2013-04-13 DIAGNOSIS — J440 Chronic obstructive pulmonary disease with acute lower respiratory infection: Principal | ICD-10-CM | POA: Insufficient documentation

## 2013-04-13 DIAGNOSIS — J309 Allergic rhinitis, unspecified: Secondary | ICD-10-CM

## 2013-04-13 DIAGNOSIS — F17200 Nicotine dependence, unspecified, uncomplicated: Secondary | ICD-10-CM | POA: Insufficient documentation

## 2013-04-13 DIAGNOSIS — J441 Chronic obstructive pulmonary disease with (acute) exacerbation: Secondary | ICD-10-CM

## 2013-04-13 DIAGNOSIS — Z720 Tobacco use: Secondary | ICD-10-CM | POA: Diagnosis present

## 2013-04-13 DIAGNOSIS — D573 Sickle-cell trait: Secondary | ICD-10-CM

## 2013-04-13 DIAGNOSIS — R911 Solitary pulmonary nodule: Secondary | ICD-10-CM

## 2013-04-13 DIAGNOSIS — G4733 Obstructive sleep apnea (adult) (pediatric): Secondary | ICD-10-CM | POA: Insufficient documentation

## 2013-04-13 DIAGNOSIS — Z9981 Dependence on supplemental oxygen: Secondary | ICD-10-CM | POA: Insufficient documentation

## 2013-04-13 DIAGNOSIS — R079 Chest pain, unspecified: Secondary | ICD-10-CM

## 2013-04-13 DIAGNOSIS — R040 Epistaxis: Secondary | ICD-10-CM

## 2013-04-13 DIAGNOSIS — R490 Dysphonia: Secondary | ICD-10-CM

## 2013-04-13 DIAGNOSIS — J209 Acute bronchitis, unspecified: Principal | ICD-10-CM | POA: Diagnosis present

## 2013-04-13 DIAGNOSIS — I5031 Acute diastolic (congestive) heart failure: Secondary | ICD-10-CM

## 2013-04-13 DIAGNOSIS — Z87891 Personal history of nicotine dependence: Secondary | ICD-10-CM | POA: Diagnosis present

## 2013-04-13 DIAGNOSIS — R0602 Shortness of breath: Secondary | ICD-10-CM | POA: Insufficient documentation

## 2013-04-13 LAB — CBC
Hemoglobin: 12.8 g/dL (ref 12.0–15.0)
MCH: 28.9 pg (ref 26.0–34.0)
MCHC: 34.7 g/dL (ref 30.0–36.0)
MCHC: 34.5 g/dL (ref 30.0–36.0)
MCV: 83.3 fL (ref 78.0–100.0)
Platelets: 213 10*3/uL (ref 150–400)
RBC: 4.56 MIL/uL (ref 3.87–5.11)
RDW: 14.4 % (ref 11.5–15.5)
RDW: 14.4 % (ref 11.5–15.5)
WBC: 7.1 10*3/uL (ref 4.0–10.5)

## 2013-04-13 LAB — BASIC METABOLIC PANEL
CO2: 28 mEq/L (ref 19–32)
Calcium: 9.4 mg/dL (ref 8.4–10.5)
Creatinine, Ser: 0.85 mg/dL (ref 0.50–1.10)
GFR calc non Af Amer: 76 mL/min — ABNORMAL LOW (ref >90)
Glucose, Bld: 105 mg/dL — ABNORMAL HIGH (ref 70–99)
Sodium: 141 mEq/L (ref 135–145)

## 2013-04-13 LAB — CREATININE, SERUM
Creatinine, Ser: 0.92 mg/dL (ref 0.50–1.10)
GFR calc non Af Amer: 69 mL/min — ABNORMAL LOW (ref >90)

## 2013-04-13 LAB — POCT I-STAT TROPONIN I: Troponin i, poc: 0 ng/mL (ref 0.00–0.08)

## 2013-04-13 MED ORDER — BUDESONIDE-FORMOTEROL FUMARATE 160-4.5 MCG/ACT IN AERO
2.0000 | INHALATION_SPRAY | Freq: Two times a day (BID) | RESPIRATORY_TRACT | Status: DC
  Administered 2013-04-14 – 2013-04-15 (×3): 2 via RESPIRATORY_TRACT
  Filled 2013-04-13: qty 6

## 2013-04-13 MED ORDER — PREDNISONE 50 MG PO TABS
60.0000 mg | ORAL_TABLET | Freq: Two times a day (BID) | ORAL | Status: DC
  Administered 2013-04-13 – 2013-04-14 (×2): 60 mg via ORAL
  Filled 2013-04-13 (×3): qty 1

## 2013-04-13 MED ORDER — GUAIFENESIN ER 600 MG PO TB12
600.0000 mg | ORAL_TABLET | Freq: Two times a day (BID) | ORAL | Status: DC
  Administered 2013-04-13 – 2013-04-15 (×4): 600 mg via ORAL
  Filled 2013-04-13 (×5): qty 1

## 2013-04-13 MED ORDER — ALBUTEROL SULFATE (5 MG/ML) 0.5% IN NEBU: 2.5000 mg | INHALATION_SOLUTION | RESPIRATORY_TRACT | Status: DC

## 2013-04-13 MED ORDER — VITAMIN B-12 500 MCG PO TABS: 500.0000 ug | ORAL_TABLET | Freq: Every day | ORAL | Status: DC

## 2013-04-13 MED ORDER — ACETAMINOPHEN 325 MG PO TABS: 650.0000 mg | ORAL_TABLET | Freq: Four times a day (QID) | ORAL | Status: DC | PRN

## 2013-04-13 MED ORDER — ACETAMINOPHEN 650 MG RE SUPP: 650.0000 mg | Freq: Four times a day (QID) | RECTAL | Status: DC | PRN

## 2013-04-13 MED ORDER — IPRATROPIUM BROMIDE 0.02 % IN SOLN
0.5000 mg | RESPIRATORY_TRACT | Status: DC
  Administered 2013-04-13 – 2013-04-14 (×3): 0.5 mg via RESPIRATORY_TRACT
  Filled 2013-04-13 (×3): qty 2.5

## 2013-04-13 MED ORDER — SODIUM CHLORIDE 0.9 % IJ SOLN
3.0000 mL | Freq: Two times a day (BID) | INTRAMUSCULAR | Status: DC
  Administered 2013-04-13 – 2013-04-15 (×4): 3 mL via INTRAVENOUS

## 2013-04-13 MED ORDER — TRAZODONE HCL 50 MG PO TABS
50.0000 mg | ORAL_TABLET | Freq: Every evening | ORAL | Status: DC | PRN
  Administered 2013-04-13: 50 mg via ORAL
  Filled 2013-04-13: qty 1

## 2013-04-13 MED ORDER — IPRATROPIUM BROMIDE 0.02 % IN SOLN
0.5000 mg | Freq: Once | RESPIRATORY_TRACT | Status: AC
  Administered 2013-04-13: 0.5 mg via RESPIRATORY_TRACT
  Filled 2013-04-13 (×2): qty 2.5

## 2013-04-13 MED ORDER — SODIUM CHLORIDE 0.9 % IV SOLN: 250.0000 mL | INTRAVENOUS | Status: DC | PRN

## 2013-04-13 MED ORDER — DOXYCYCLINE HYCLATE 100 MG PO TABS
100.0000 mg | ORAL_TABLET | Freq: Two times a day (BID) | ORAL | Status: DC
  Administered 2013-04-13 – 2013-04-15 (×4): 100 mg via ORAL
  Filled 2013-04-13 (×6): qty 1

## 2013-04-13 MED ORDER — TRIAMTERENE-HCTZ 37.5-25 MG PO CAPS
1.0000 | ORAL_CAPSULE | Freq: Every day | ORAL | Status: DC
  Administered 2013-04-14 – 2013-04-15 (×2): 1 via ORAL
  Filled 2013-04-13 (×2): qty 1

## 2013-04-13 MED ORDER — ALBUTEROL SULFATE (5 MG/ML) 0.5% IN NEBU
5.0000 mg | INHALATION_SOLUTION | Freq: Once | RESPIRATORY_TRACT | Status: AC
  Administered 2013-04-13: 5 mg via RESPIRATORY_TRACT
  Filled 2013-04-13: qty 1

## 2013-04-13 MED ORDER — FLUTICASONE PROPIONATE 50 MCG/ACT NA SUSP
2.0000 | Freq: Every day | NASAL | Status: DC
  Administered 2013-04-14 – 2013-04-15 (×2): 2 via NASAL
  Filled 2013-04-13: qty 16

## 2013-04-13 MED ORDER — ALUM & MAG HYDROXIDE-SIMETH 200-200-20 MG/5ML PO SUSP: 30.0000 mL | Freq: Four times a day (QID) | ORAL | Status: DC | PRN

## 2013-04-13 MED ORDER — ALBUTEROL SULFATE (5 MG/ML) 0.5% IN NEBU
5.0000 mg | INHALATION_SOLUTION | Freq: Once | RESPIRATORY_TRACT | Status: AC
  Administered 2013-04-13: 5 mg via RESPIRATORY_TRACT
  Filled 2013-04-13 (×2): qty 1

## 2013-04-13 MED ORDER — LORATADINE 10 MG PO TABS
10.0000 mg | ORAL_TABLET | Freq: Every day | ORAL | Status: DC
  Administered 2013-04-13 – 2013-04-15 (×3): 10 mg via ORAL
  Filled 2013-04-13 (×3): qty 1

## 2013-04-13 MED ORDER — HYDROCODONE-ACETAMINOPHEN 5-325 MG PO TABS: 1.0000 | ORAL_TABLET | ORAL | Status: DC | PRN

## 2013-04-13 MED ORDER — ALBUTEROL SULFATE (5 MG/ML) 0.5% IN NEBU
10.0000 mg | INHALATION_SOLUTION | Freq: Once | RESPIRATORY_TRACT | Status: AC
  Administered 2013-04-13: 10 mg via RESPIRATORY_TRACT
  Filled 2013-04-13: qty 2

## 2013-04-13 MED ORDER — SODIUM CHLORIDE 0.9 % IJ SOLN
3.0000 mL | INTRAMUSCULAR | Status: DC | PRN
  Administered 2013-04-13: 3 mL via INTRAVENOUS

## 2013-04-13 MED ORDER — ASPIRIN EC 81 MG PO TBEC
81.0000 mg | DELAYED_RELEASE_TABLET | Freq: Every day | ORAL | Status: DC
  Administered 2013-04-13 – 2013-04-15 (×3): 81 mg via ORAL
  Filled 2013-04-13 (×3): qty 1

## 2013-04-13 MED ORDER — IPRATROPIUM BROMIDE 0.02 % IN SOLN: 0.5000 mg | RESPIRATORY_TRACT | Status: DC

## 2013-04-13 MED ORDER — SENNOSIDES-DOCUSATE SODIUM 8.6-50 MG PO TABS: 1.0000 | ORAL_TABLET | Freq: Every evening | ORAL | Status: DC | PRN

## 2013-04-13 MED ORDER — POTASSIUM CHLORIDE CRYS ER 20 MEQ PO TBCR
20.0000 meq | EXTENDED_RELEASE_TABLET | Freq: Two times a day (BID) | ORAL | Status: DC
  Administered 2013-04-13 – 2013-04-15 (×4): 20 meq via ORAL
  Filled 2013-04-13 (×5): qty 1

## 2013-04-13 MED ORDER — PREDNISONE 20 MG PO TABS
60.0000 mg | ORAL_TABLET | Freq: Once | ORAL | Status: AC
  Administered 2013-04-13: 60 mg via ORAL
  Filled 2013-04-13: qty 3

## 2013-04-13 MED ORDER — ENOXAPARIN SODIUM 40 MG/0.4ML ~~LOC~~ SOLN
40.0000 mg | SUBCUTANEOUS | Status: DC
  Administered 2013-04-13 – 2013-04-14 (×2): 40 mg via SUBCUTANEOUS
  Filled 2013-04-13 (×3): qty 0.4

## 2013-04-13 MED ORDER — ONDANSETRON HCL 4 MG PO TABS: 4.0000 mg | ORAL_TABLET | Freq: Four times a day (QID) | ORAL | Status: DC | PRN

## 2013-04-13 MED ORDER — CYANOCOBALAMIN 500 MCG PO TABS
500.0000 ug | ORAL_TABLET | Freq: Every day | ORAL | Status: DC
  Administered 2013-04-14 – 2013-04-15 (×2): 500 ug via ORAL
  Filled 2013-04-13 (×2): qty 1

## 2013-04-13 MED ORDER — PANTOPRAZOLE SODIUM 40 MG PO TBEC
40.0000 mg | DELAYED_RELEASE_TABLET | Freq: Every day | ORAL | Status: DC
  Administered 2013-04-14 – 2013-04-15 (×2): 40 mg via ORAL
  Filled 2013-04-13: qty 1

## 2013-04-13 MED ORDER — ONDANSETRON HCL 4 MG/2ML IJ SOLN: 4.0000 mg | Freq: Four times a day (QID) | INTRAMUSCULAR | Status: DC | PRN

## 2013-04-13 MED ORDER — ALBUTEROL SULFATE (5 MG/ML) 0.5% IN NEBU
2.5000 mg | INHALATION_SOLUTION | RESPIRATORY_TRACT | Status: DC
  Administered 2013-04-13 – 2013-04-14 (×3): 2.5 mg via RESPIRATORY_TRACT
  Filled 2013-04-13 (×3): qty 0.5

## 2013-04-13 MED ORDER — DOXYCYCLINE HYCLATE 100 MG PO TABS
100.0000 mg | ORAL_TABLET | Freq: Once | ORAL | Status: AC
  Administered 2013-04-13: 100 mg via ORAL
  Filled 2013-04-13: qty 1

## 2013-04-13 NOTE — ED Provider Notes (Signed)
History     CSN: [id number]  Arrival date & time 04/13/13  1043   First MD Initiated Contact with Patient 04/13/13 1108      Chief Complaint  Patient presents with  . Shortness of Breath  . Chest Pain    Patient is a 57 y.o. female presenting with shortness of breath. The history is provided by the patient.  Shortness of Breath Severity:  Moderate Onset quality:  Gradual Duration:  4 days Timing:  Intermittent Progression:  Worsening Chronicity:  Recurrent Relieved by:  Nothing Worsened by:  Nothing tried Ineffective treatments:  Oxygen (nebulizer) Associated symptoms: fever, hemoptysis, sputum production and wheezing   Associated symptoms: no vomiting    Pt has h/o COPD with O2 2L at all times Presents with increasing cough, SOB and sputum production.  She reports mostly greenish sputum but reports small amt of blood mixed in sputum She also reports bilateral "lung pain" from cough She also reports feeling feverish No LE edema She does report increased use of oxygen She reports similar to previous episodes of pneumonia  Past Medical History  Diagnosis Date  . Borderline diabetes   . Hypertension   . Anxiety   . Emphysema   . COPD (chronic obstructive pulmonary disease)   . OSA on CPAP     CPAP at night   . Dizziness     pt believes this is motion sickness or vertigo  . GERD (gastroesophageal reflux disease)   . Headache(784.0)   . Cancer 1990    cervical   . Anemia   . Morbid obesity   . Sickle cell trait   . Hx of cardiac catheterization     a. LHC at [facility] in [place], [place] 09/2008:  Normal coronary arteries EF 70%.  . Tobacco abuse     a. up to 3ppd from age 50 to 8, now 1/4 ppd (01/2013)    Past Surgical History  Procedure Laterality Date  . Tubal ligation    . Cardiac catheterization    . Colonoscopy  09/05/2012    Procedure: COLONOSCOPY;  Surgeon: Theda Belfast, MD;  Location: [facility] ENDOSCOPY;  Service: Endoscopy;  Laterality: N/A;    Family  History  Problem Relation Age of Onset  . Lung cancer Paternal Aunt   . Lung cancer Paternal Grandfather   . Other Father     unaware of father's medical history  . Diabetes Mother     alive @ 40  . Other      multiple siblings a&w.    History  Substance Use Topics  . Smoking status: Current Every Day Smoker -- 0.30 packs/day for 36 years    Types: Cigarettes    Last Attempt to Quit: 05/14/2012  . Smokeless tobacco: Never Used     Comment: Approx 90 pk-yrs (up to 3ppd until ~ 2009). Smoking 5 cigs per day now.  . Alcohol Use: No    OB History   Grav Para Term Preterm Abortions TAB SAB Ect Mult Living                  Review of Systems  Constitutional: Positive for fever and chills.  Respiratory: Positive for hemoptysis, sputum production, shortness of breath and wheezing.   Cardiovascular: Negative for leg swelling.  Gastrointestinal: Positive for diarrhea. Negative for vomiting.  Neurological: Negative for syncope and weakness.  All other systems reviewed and are negative.    Allergies  Review of patient's allergies indicates no known allergies.  Home Medications   Current Outpatient Rx  Name  Route  Sig  Dispense  Refill  . albuterol (PROVENTIL HFA;VENTOLIN HFA) 108 (90 BASE) MCG/ACT inhaler   Inhalation   Inhale 2 puffs into the lungs every 4 (four) hours as needed for wheezing.   1 Inhaler   3   . albuterol (PROVENTIL) (2.5 MG/3ML) 0.083% nebulizer solution   Nebulization   Take 2.5 mg by nebulization 4 (four) times daily as needed. DX: 496. For wheezing         . aspirin EC 81 MG tablet   Oral   Take 81 mg by mouth daily.         . fluticasone (FLONASE) 50 MCG/ACT nasal spray   Nasal   Place 2 sprays into the nose 2 (two) times daily.   16 g   11   . loratadine (CLARITIN) 10 MG tablet      take 1 tablet by mouth once daily   30 tablet   5   . omeprazole (PRILOSEC) 20 MG capsule   Oral   Take 20 mg by mouth 2 (two) times daily.          . potassium chloride SA (K-DUR,KLOR-CON) 20 MEQ tablet   Oral   Take 20 mEq by mouth 2 (two) times daily.         . sodium chloride (OCEAN) 0.65 % nasal spray   Nasal   Place 1 spray into the nose at bedtime as needed. For nasal congestion         . SPIRIVA HANDIHALER 18 MCG inhalation capsule      inhale contents of 1 capsule by mouth once daily   30 each   5   . SYMBICORT 160-4.5 MCG/ACT inhaler      inhale 2 puffs by mouth twice a day   10.2 g   6   . triamterene-hydrochlorothiazide (DYAZIDE) 37.5-25 MG per capsule      take 1 capsule by mouth once daily   30 capsule   3   . vitamin B-12 (CYANOCOBALAMIN) 500 MCG tablet   Oral   Take 500 mcg by mouth daily.           BP 138/83  Pulse 101  Temp(Src) 98.2 F (36.8 C) (Oral)  Resp 24  SpO2 98%  LMP 06/04/2000  Physical Exam CONSTITUTIONAL: Well developed/well nourished HEAD: Normocephalic/atraumatic EYES: EOMI/PERRL ENMT: Mucous membranes moist NECK: supple no meningeal signs SPINE:entire spine nontender CV: S1/S2 noted, no murmurs/rubs/gallops noted LUNGS:wheezing noted bilaterally.  No distress noted.  She is able to speak to me comfortably ABDOMEN: soft, nontender, no rebound or guarding GU:no cva tenderness NEURO: Pt is awake/alert, moves all extremitiesx4 EXTREMITIES: pulses normal, full ROM, no LE edema SKIN: warm, color normal PSYCH: no abnormalities of mood noted  ED Course  Procedures   Labs Reviewed  BASIC METABOLIC PANEL  CBC  POCT I-STAT TROPONIN I   11:40 AM Pt here with increased cough, sputum production and wheezing Will start with nebs and reassess  My suspicion for ACS/PE at this time is low 12:48 PM Pt with some improvement but does have wheeze still present Will try another neb She denies h/o intubation 2:46 PM Pt symptomatic after walking with increased wheeze despite recent nebs in the ED Will admit for observation for continued nebs and monitoring D/w dr  Lendell Caprice to admit  Pt agreeable with plan  MDM  Nursing notes including past medical history and social history reviewed and  considered in documentation xrays reviewed and considered Previous records reviewed and considered     04/13/2013 1049am / Rate: 95  /Rhythm: normal sinus rhythm  QRS Axis: normal  Intervals: normal  ST/T Wave abnormalities: nonspecific ST changes  Conduction Disutrbances:none  Narrative Interpretation:   Old EKG Reviewed: unchanged    Joya Gaskins, MD 04/13/13 [phone]

## 2013-04-13 NOTE — ED Notes (Signed)
Ambulated pt. Pt complained off dizziness and increased wheezing and SOB. Patient was assisted back to bed.

## 2013-04-13 NOTE — H&P (Signed)
[facility] History and Physical  Stephanie Schultz ZOX:[id number] DOB: 1956-02-19 DOA: 04/13/2013  Referring physician: Bebe Shaggy PCP: Lonia Blood, MD   Chief Complaint: cough, wheeze  HPI: Stephanie Schultz is a 57 y.o. female with dyspnea and cough productive of yellow sputum for a few days.  Chronic home O2 and cpap qhs for OSA.  Still smokes. Afebrile and VS stable.  CXR no infiltrate. Received pred and albuterol.  Remains DOE in the ED, so placed on obs.  Review of Systems: all systems reviewed. As above, otherwise negative  Past Medical History  Diagnosis Date  . Borderline diabetes   . Hypertension   . Anxiety   . Emphysema   . COPD (chronic obstructive pulmonary disease)   . OSA on CPAP     CPAP at night   . Dizziness     pt believes this is motion sickness or vertigo  . GERD (gastroesophageal reflux disease)   . Headache(784.0)   . Cancer 1990    cervical   . Anemia   . Morbid obesity   . Sickle cell trait   . Hx of cardiac catheterization     a. LHC at [facility] in [place], [place] 09/2008:  Normal coronary arteries EF 70%.  . Tobacco abuse     a. up to 3ppd from age 85 to 4, now 1/4 ppd (01/2013)   Past Surgical History  Procedure Laterality Date  . Tubal ligation    . Cardiac catheterization    . Colonoscopy  09/05/2012    Procedure: COLONOSCOPY;  Surgeon: Theda Belfast, MD;  Location: [facility];  Service: Endoscopy;  Laterality: N/A;   Social History: smokes a few cigarettes per day. Disabled. Denies heavy alcohol or drugs  No Known Allergies  Family History  Problem Relation Age of Onset  . Lung cancer Paternal Aunt   . Lung cancer Paternal Grandfather   . Other Father     unaware of father's medical history  . Diabetes Mother     alive @ 43  . Other      multiple siblings a&w.    Prior to Admission medications   Medication Sig Start Date End Date Taking? Authorizing Provider  albuterol (PROVENTIL HFA;VENTOLIN HFA) 108 (90 BASE) MCG/ACT inhaler Inhale  2 puffs into the lungs every 4 (four) hours as needed for wheezing. 03/11/13 03/11/14 Yes Leslye Peer, MD  albuterol (PROVENTIL) (2.5 MG/3ML) 0.083% nebulizer solution Take 2.5 mg by nebulization 4 (four) times daily as needed. DX: 496. For wheezing 11/25/12 11/25/13 Yes Leslye Peer, MD  aspirin EC 81 MG tablet Take 81 mg by mouth daily.   Yes Historical Provider, MD  fluticasone (FLONASE) 50 MCG/ACT nasal spray Place 2 sprays into the nose 2 (two) times daily. 07/08/12 07/08/13 Yes Leslye Peer, MD  loratadine (CLARITIN) 10 MG tablet take 1 tablet by mouth once daily 11/14/12  Yes Leslye Peer, MD  omeprazole (PRILOSEC) 20 MG capsule Take 20 mg by mouth 2 (two) times daily.   Yes Historical Provider, MD  potassium chloride SA (K-DUR,KLOR-CON) 20 MEQ tablet Take 20 mEq by mouth 2 (two) times daily.   Yes Historical Provider, MD  sodium chloride (OCEAN) 0.65 % nasal spray Place 1 spray into the nose at bedtime as needed. For nasal congestion   Yes Historical Provider, MD  [facility] HANDIHALER 18 MCG inhalation capsule inhale contents of 1 capsule by mouth once daily 12/15/12  Yes Leslye Peer, MD  SYMBICORT 160-4.5 MCG/ACT inhaler inhale 2  puffs by mouth twice a day 04/10/13  Yes Leslye Peer, MD  triamterene-hydrochlorothiazide (DYAZIDE) 37.5-25 MG per capsule take 1 capsule by mouth once daily 08/11/12  Yes Leslye Peer, MD  vitamin B-12 (CYANOCOBALAMIN) 500 MCG tablet Take 500 mcg by mouth daily.   Yes Historical Provider, MD   Physical Exam: Filed Vitals:   04/13/13 1400 04/13/13 1420 04/13/13 1430 04/13/13 1500  BP: 134/36  145/110 128/68  Pulse: 103  103 104  Temp:      TempSrc:      Resp: 22  21 28   SpO2: 96% 98% 100% 97%   BP 111/88  Pulse 101  Temp(Src) 98.2 F (36.8 C) (Oral)  Resp 18  Ht 5' (1.524 m)  Wt 93.6 kg (206 lb 5.6 oz)  BMI 40.3 kg/m2  SpO2 98%  LMP 06/04/2000  General Appearance:    Alert, cooperative, no distress, appears stated age. obese  Head:     Normocephalic, without obvious abnormality, atraumatic  Eyes:    PERRL, conjunctiva/corneas clear, EOM's intact, fundi    benign, both eyes  Ears:    Normal TM's and external ear canals, both ears  Nose:   Nares normal, septum midline, mucosa normal, no drainage    or sinus tenderness  Throat:   Lips, mucosa, and tongue normal; teeth and gums normal  Neck:   Supple, symmetrical, trachea midline, no adenopathy;    thyroid:  no enlargement/tenderness/nodules; no carotid   bruit or JVD  Back:     Symmetric, no curvature, ROM normal, no CVA tenderness  Lungs:     Diminished. Moderate air movement. Exp wheeze  Chest Wall:    No tenderness or deformity   Heart:    Regular rate and rhythm, S1 and S2 normal, no murmur, rub   or gallop  Breast Exam:    deferred  Abdomen:     Soft, non-tender, bowel sounds active all four quadrants,    no masses, no organomegaly  Genitalia:   deferred  Rectal:   deffered  Extremities:   Extremities normal, atraumatic, no cyanosis or edema  Pulses:   2+ and symmetric all extremities  Skin:   Skin color, texture, turgor normal, no rashes or lesions  Lymph nodes:   Cervical, supraclavicular, and axillary nodes normal  Neurologic:   CNII-XII intact, normal strength, sensation and reflexes    throughout    Psychiatric:  Normal affect  Labs on Admission:  Basic Metabolic Panel:  Recent Labs Lab 04/13/13 1103  NA 141  K 3.6  CL 101  CO2 28  GLUCOSE 105*  BUN 8  CREATININE 0.85  CALCIUM 9.4   Liver Function Tests: No results found for this basename: AST, ALT, ALKPHOS, BILITOT, PROT, ALBUMIN,  in the last 168 hours No results found for this basename: LIPASE, AMYLASE,  in the last 168 hours No results found for this basename: AMMONIA,  in the last 168 hours CBC:  Recent Labs Lab 04/13/13 1103  WBC 4.8  HGB 13.2  HCT 38.0  MCV 83.3  PLT 213   Cardiac Enzymes: No results found for this basename: CKTOTAL, CKMB, CKMBINDEX, TROPONINI,  in the last  168 hours  BNP (last 3 results)  Recent Labs  01/17/13 1410  PROBNP 103.2   CBG: No results found for this basename: GLUCAP,  in the last 168 hours  Radiological Exams on Admission: Dg Chest Portable 1 View  04/13/2013   *RADIOLOGY REPORT*  Clinical Data: Shortness of breath, cough  and congestion.  PORTABLE CHEST - 1 VIEW  Comparison: Single view chest 01/16/2013 and PA and lateral chest 12/05/2011.  CT chest 12/26/2011.  Findings: The lungs are emphysematous but clear.  Heart size is normal.  No pneumothorax or pleural effusion.  IMPRESSION: Emphysema without acute disease.   Original Report Authenticated By: Holley Dexter, M.D.   Assessment/Plan Principal Problem:   COPD exacerbation Active Problems:   Hypertension   Obstructive sleep apnea   GERD (gastroesophageal reflux disease)   Tobacco abuse   Morbid obesity   Acute bronchitis  Observation, antibiotics, prednisone, bronchodilators. Monitor CBGs. CPAP qhs. Continue O2 at 2 liters (home dose)  Code Status: full Family Communication:  Disposition Plan: home  Time spent: 50 minutes  Stephanie Schultz L [facility] Pager [phone]  If 7PM-7AM, please contact night-coverage [email or web address] Password [facility] 04/13/2013, 5:13 PM

## 2013-04-13 NOTE — Progress Notes (Signed)
Placed patient on auto titrate max 20 min 7 with 3lpm 02 bleed in and medium full face mask.  Patient tolerating well.

## 2013-04-13 NOTE — ED Notes (Signed)
Pt is here with increased shortness of breath, back pain, and chest tightness.  HX of COPD and oxygen dependent.  Pt reports some hemoptysis and green sputum

## 2013-04-14 MED ORDER — IPRATROPIUM BROMIDE 0.02 % IN SOLN
0.5000 mg | RESPIRATORY_TRACT | Status: DC
  Administered 2013-04-14 (×2): 0.5 mg via RESPIRATORY_TRACT
  Filled 2013-04-14 (×2): qty 2.5

## 2013-04-14 MED ORDER — ALBUTEROL SULFATE (5 MG/ML) 0.5% IN NEBU
2.5000 mg | INHALATION_SOLUTION | Freq: Four times a day (QID) | RESPIRATORY_TRACT | Status: DC
  Administered 2013-04-14 – 2013-04-15 (×5): 2.5 mg via RESPIRATORY_TRACT
  Filled 2013-04-14 (×5): qty 0.5

## 2013-04-14 MED ORDER — PREDNISONE 50 MG PO TABS
60.0000 mg | ORAL_TABLET | Freq: Every day | ORAL | Status: DC
  Administered 2013-04-15: 60 mg via ORAL
  Filled 2013-04-14 (×2): qty 1

## 2013-04-14 MED ORDER — ALBUTEROL SULFATE (5 MG/ML) 0.5% IN NEBU: 2.5000 mg | INHALATION_SOLUTION | RESPIRATORY_TRACT | Status: DC | PRN

## 2013-04-14 MED ORDER — ALBUTEROL SULFATE (5 MG/ML) 0.5% IN NEBU: 2.5000 mg | INHALATION_SOLUTION | RESPIRATORY_TRACT | Status: DC

## 2013-04-14 MED ORDER — GUAIFENESIN-DM 100-10 MG/5ML PO SYRP
10.0000 mL | ORAL_SOLUTION | ORAL | Status: DC | PRN
  Administered 2013-04-14 – 2013-04-15 (×6): 10 mL via ORAL
  Filled 2013-04-14: qty 10
  Filled 2013-04-14: qty 5
  Filled 2013-04-14 (×3): qty 10
  Filled 2013-04-14: qty 5
  Filled 2013-04-14: qty 10

## 2013-04-14 MED ORDER — INSULIN ASPART 100 UNIT/ML ~~LOC~~ SOLN
0.0000 [IU] | Freq: Three times a day (TID) | SUBCUTANEOUS | Status: DC
  Administered 2013-04-14: 3 [IU] via SUBCUTANEOUS
  Administered 2013-04-15: 2 [IU] via SUBCUTANEOUS
  Administered 2013-04-15: 1 [IU] via SUBCUTANEOUS

## 2013-04-14 NOTE — Progress Notes (Addendum)
[facility] PROGRESS NOTE  Stephanie Schultz WUJ:[id number] DOB: 1956/05/25 DOA: 04/13/2013 PCP: Lonia Blood, MD  Brief narrative Stephanie Schultz is a 57 y.o. female with dyspnea and cough productive of yellow sputum for a few days. Chronic home O2 and cpap qhs for OSA. Still smokes. Afebrile and VS stable. CXR no infiltrate. Received pred and albuterol. Remains DOE in the ED, so placed on obs.  Assessment/Plan: 1. COPD exacerbation/acute bronchitis: Extensively counseled regarding tobacco cessation and patient verbalized understanding. Continue prednisone, bronchodilators, oxygen. Add flutter valve. Improving. Monitor closely. 2. Tobacco abuse: Cessation counseled. 3. OSA: Continue nightly CPAP. 4. Chronic respiratory failure: Secondary to COPD and OSA. Management as above. 5. Hypertension: Continue diuretics. 6. Uncontrolled type II DM: Hemoglobin A1c in March 6.9. Not on meds at home. SSI. Consider by mouth meds at discharge. 7. GERD: Continue PPI 8. Morbid obesity  Code Status: Full Family Communication: None Disposition Plan: Home when medically stable   Consultants:  None  Procedures:  None  Antibiotics:  Oral doxycycline   HPI/Subjective: Overall feels much better. Some dyspnea on exertion. Denies chest pain.  Objective: Filed Vitals:   04/13/13 1700 04/13/13 1802 04/13/13 2209 04/14/13 0530  BP: 135/67 135/67 94/57 111/88  Pulse: 107 107 111 101  Temp: 97.4 F (36.3 C) 97.4 F (36.3 C) 98.4 F (36.9 C) 98.2 F (36.8 C)  TempSrc:  Oral Oral Oral  Resp:  18 18 18   Height: 5' (1.524 m) 5' (1.524 m)    Weight: 93.622 kg (206 lb 6.4 oz) 93.6 kg (206 lb 5.6 oz)    SpO2: 97% 97% 97% 98%    Intake/Output Summary (Last 24 hours) at 04/14/13 0740 Last data filed at 04/14/13 0532  Gross per 24 hour  Intake      0 ml  Output      4 ml  Net     -4 ml   Filed Weights   04/13/13 1700 04/13/13 1802  Weight: 93.622 kg (206 lb 6.4 oz) 93.6 kg (206 lb 5.6 oz)     Exam:   General exam: Comfortable. Seen ambulating in the room. Obese.  Respiratory system: Reduced breath sounds bilaterally with occasional expiratory rhonchi but no crackles. No increased work of breathing. Able to speak in full sentences.  Cardiovascular system: S1 & S2 heard, RRR. No JVD, murmurs, gallops, clicks or pedal edema.  Gastrointestinal system: Abdomen is obese/nondistended, soft and nontender. Normal bowel sounds heard.  Central nervous system: Alert and oriented. No focal neurological deficits.  Extremities: Symmetric 5 x 5 power.   Data Reviewed: Basic Metabolic Panel:  Recent Labs Lab 04/13/13 1103 04/13/13 1920  NA 141  --   K 3.6  --   CL 101  --   CO2 28  --   GLUCOSE 105*  --   BUN 8  --   CREATININE 0.85 0.92  CALCIUM 9.4  --    Liver Function Tests: No results found for this basename: AST, ALT, ALKPHOS, BILITOT, PROT, ALBUMIN,  in the last 168 hours No results found for this basename: LIPASE, AMYLASE,  in the last 168 hours No results found for this basename: AMMONIA,  in the last 168 hours CBC:  Recent Labs Lab 04/13/13 1103 04/13/13 1920  WBC 4.8 7.1  HGB 13.2 12.8  HCT 38.0 37.1  MCV 83.3 83.0  PLT 213 201   Cardiac Enzymes: No results found for this basename: CKTOTAL, CKMB, CKMBINDEX, TROPONINI,  in the last 168 hours BNP (last 3 results)  Recent Labs  01/17/13 1410  PROBNP 103.2   CBG:  Recent Labs Lab 04/13/13 1807 04/14/13 0530  GLUCAP 195* 190*    No results found for this or any previous visit (from the past 240 hour(s)).   Studies: Dg Chest Portable 1 View  04/13/2013   *RADIOLOGY REPORT*  Clinical Data: Shortness of breath, cough and congestion.  PORTABLE CHEST - 1 VIEW  Comparison: Single view chest 01/16/2013 and PA and lateral chest 12/05/2011.  CT chest 12/26/2011.  Findings: The lungs are emphysematous but clear.  Heart size is normal.  No pneumothorax or pleural effusion.  IMPRESSION: Emphysema  without acute disease.   Original Report Authenticated By: Holley Dexter, M.D.     Additional labs:   Scheduled Meds: . albuterol  2.5 mg Nebulization Q4H while awake  . aspirin EC  81 mg Oral Daily  . budesonide-formoterol  2 puff Inhalation BID  . vitamin B-12  500 mcg Oral Daily  . doxycycline  100 mg Oral Q12H  . enoxaparin (LOVENOX) injection  40 mg Subcutaneous Q24H  . fluticasone  2 spray Each Nare Daily  . guaiFENesin  600 mg Oral BID  . ipratropium  0.5 mg Nebulization Q4H while awake  . loratadine  10 mg Oral Daily  . pantoprazole  40 mg Oral Daily  . potassium chloride SA  20 mEq Oral BID  . predniSONE  60 mg Oral BID  . sodium chloride  3 mL Intravenous Q12H  . triamterene-hydrochlorothiazide  1 each Oral Daily   Continuous Infusions:   Principal Problem:   COPD exacerbation Active Problems:   Hypertension   Obstructive sleep apnea   GERD (gastroesophageal reflux disease)   Tobacco abuse   Morbid obesity   Acute bronchitis    Time spent: 20 minutes    [facility]  [facility] Pager [phone].   If 8PM-8AM, please contact night-coverage at [email or web address], password [facility] 04/14/2013, 7:40 AM  LOS: 1 day

## 2013-04-15 DIAGNOSIS — J449 Chronic obstructive pulmonary disease, unspecified: Secondary | ICD-10-CM

## 2013-04-15 DIAGNOSIS — R079 Chest pain, unspecified: Secondary | ICD-10-CM

## 2013-04-15 DIAGNOSIS — J9610 Chronic respiratory failure, unspecified whether with hypoxia or hypercapnia: Secondary | ICD-10-CM

## 2013-04-15 DIAGNOSIS — J309 Allergic rhinitis, unspecified: Secondary | ICD-10-CM

## 2013-04-15 LAB — GLUCOSE, CAPILLARY: Glucose-Capillary: 162 mg/dL — ABNORMAL HIGH (ref 70–99)

## 2013-04-15 MED ORDER — PREDNISONE (PAK) 10 MG PO TABS: ORAL_TABLET | ORAL | Status: DC

## 2013-04-15 MED ORDER — GUAIFENESIN ER 600 MG PO TB12: 600.0000 mg | ORAL_TABLET | Freq: Two times a day (BID) | ORAL | Status: DC

## 2013-04-15 MED ORDER — DOXYCYCLINE HYCLATE 100 MG PO TABS: 100.0000 mg | ORAL_TABLET | Freq: Two times a day (BID) | ORAL | Status: DC

## 2013-04-15 MED ORDER — ALBUTEROL SULFATE (2.5 MG/3ML) 0.083% IN NEBU: 2.5000 mg | INHALATION_SOLUTION | Freq: Four times a day (QID) | RESPIRATORY_TRACT | Status: DC

## 2013-04-15 MED ORDER — IPRATROPIUM BROMIDE 0.02 % IN SOLN
0.5000 mg | Freq: Four times a day (QID) | RESPIRATORY_TRACT | Status: DC
  Administered 2013-04-15 (×2): 0.5 mg via RESPIRATORY_TRACT
  Filled 2013-04-15 (×2): qty 2.5

## 2013-04-15 NOTE — Progress Notes (Signed)
Flutter valve started. Pt. Has good effort.

## 2013-04-15 NOTE — Progress Notes (Signed)
Patient discharged to home with instructions. 

## 2013-04-15 NOTE — Discharge Summary (Signed)
Physician Discharge Summary  Patient ID: Stephanie Schultz MRN: [id number] DOB/AGE: 1956-05-15 57 y.o.  Admit date: 04/13/2013 Discharge date: 04/15/2013  Primary Care Physician:  Lonia Blood, MD  Discharge Diagnoses:    . Tobacco abuse . Obstructive sleep apnea . Morbid obesity . Hypertension . GERD (gastroesophageal reflux disease) . COPD exacerbation . Acute bronchitis  Consults: None    Allergies:  No Known Allergies   Discharge Medications:   Medication List    TAKE these medications       albuterol 108 (90 BASE) MCG/ACT inhaler  Commonly known as:  PROVENTIL HFA;VENTOLIN HFA  Inhale 2 puffs into the lungs every 4 (four) hours as needed for wheezing.     albuterol (2.5 MG/3ML) 0.083% nebulizer solution  Commonly known as:  PROVENTIL  Take 3 mLs (2.5 mg total) by nebulization 4 (four) times daily. DX: 496.  For wheezing     aspirin EC 81 MG tablet  Take 81 mg by mouth daily.     doxycycline 100 MG tablet  Commonly known as:  VIBRA-TABS  Take 1 tablet (100 mg total) by mouth every 12 (twelve) hours. X 10days     fluticasone 50 MCG/ACT nasal spray  Commonly known as:  FLONASE  Place 2 sprays into the nose 2 (two) times daily.     guaiFENesin 600 MG 12 hr tablet  Commonly known as:  MUCINEX  Take 1 tablet (600 mg total) by mouth 2 (two) times daily.     loratadine 10 MG tablet  Commonly known as:  CLARITIN  take 1 tablet by mouth once daily     omeprazole 20 MG capsule  Commonly known as:  PRILOSEC  Take 20 mg by mouth 2 (two) times daily.     potassium chloride SA 20 MEQ tablet  Commonly known as:  K-DUR,KLOR-CON  Take 20 mEq by mouth 2 (two) times daily.     predniSONE 10 MG tablet  Commonly known as:  STERAPRED UNI-PAK  Take Prednisone 40mg  (4 tabs) x 3 days, then  30mg  (3 tabs) x 3 days, then 20mg  (2 tabs) x 3days, then 10mg  (1 tab) x 3days, then OFF.     sodium chloride 0.65 % nasal spray  Commonly known as:  OCEAN  Place 1 spray into the nose  at bedtime as needed. For nasal congestion     SPIR[facility]A HANDIHALER 18 MCG inhalation capsule  Generic drug:  tiotropium  inhale contents of 1 capsule by mouth once daily     SYMBICORT 160-4.5 MCG/ACT inhaler  Generic drug:  budesonide-formoterol  inhale 2 puffs by mouth twice a day     triamterene-hydrochlorothiazide 37.5-25 MG per capsule  Commonly known as:  DYAZIDE  take 1 capsule by mouth once daily     vitamin B-12 500 MCG tablet  Commonly known as:  CYANOCOBALAMIN  Take 500 mcg by mouth daily.         Brief H and P: For complete details please refer to admission H and P, but in brief Stephanie Schultz is a 57 y.o. female with dyspnea and cough productive of yellow sputum for a few days. Chronic home O2 and cpap qhs for OSA. Still smokes. Afebrile and VS stable. CXR no infiltrate. Received pred and albuterol. The patient was admitted for observation  Hospital Course:     COPD exacerbation: patient was admitted for COPD exacerbation, placed on scheduled nebs, oxygen, steroids, doxycycline antibiotic. Patient had significant improvement in her symptoms. She was recommended to continue with nebulizer  treatments 4 times a day, with prednisone taper. She still had some scattered wheezing however per patient, this had significantly improved and she can manage at home at this point. Advised the patient to followup with her primary care physician Dr. Mikeal Hawthorne on Monday.    Obstructive sleep apnea:  Cont CPAP at night    GERD (gastroesophageal reflux disease): cont PPI    Tobacco abuse- patient counseled extensively on smoking  Day of Discharge BP 127/77  Pulse 106  Temp(Src) 97.9 F (36.6 C) (Oral)  Resp 18  Ht 5' (1.524 m)  Wt 93.6 kg (206 lb 5.6 oz)  BMI 40.3 kg/m2  SpO2 98%  LMP 06/04/2000  Physical Exam: General: Alert and awake oriented x3 not in any acute distress. CVS: S1-S2 clear no murmur rubs or gallops Chest: Mild scattered wheezing  Abdomen: soft nontender,  nondistended, normal bowel sounds Extremities: no cyanosis, clubbing or edema noted bilaterally    The results of significant diagnostics from this hospitalization (including imaging, microbiology, ancillary and laboratory) are listed below for reference.    LAB RESULTS: Basic Metabolic Panel:  Recent Labs Lab 04/13/13 1103 04/13/13 1920  NA 141  --   K 3.6  --   CL 101  --   CO2 28  --   GLUCOSE 105*  --   BUN 8  --   CREATININE 0.85 0.92  CALCIUM 9.4  --    CBC:  Recent Labs Lab 04/13/13 1103 04/13/13 1920  WBC 4.8 7.1  HGB 13.2 12.8  HCT 38.0 37.1  MCV 83.3 83.0  PLT 213 201   Cardiac Enzymes: No results found for this basename: CKTOTAL, CKMB, CKMBINDEX, TROPONINI,  in the last 168 hours BNP: No components found with this basename: POCBNP,  CBG:  Recent Labs Lab 04/14/13 2110 04/15/13 0745  GLUCAP 211* 133*    Significant Diagnostic Studies:  Dg Chest Portable 1 View  04/13/2013   *RADIOLOGY REPORT*  Clinical Data: Shortness of breath, cough and congestion.  PORTABLE CHEST - 1 VIEW  Comparison: Single view chest 01/16/2013 and PA and lateral chest 12/05/2011.  CT chest 12/26/2011.  Findings: The lungs are emphysematous but clear.  Heart size is normal.  No pneumothorax or pleural effusion.  IMPRESSION: Emphysema without acute disease.   Original Report Authenticated By: Holley Dexter, M.D.     Disposition and Follow-up:     Discharge Orders   Future Appointments Provider Department Dept Phone   05/26/2013 9:00 AM Leslye Peer, MD [facility] [phone]   Future Orders Complete By Expires     Diet Carb Modified  As directed     Increase activity slowly  As directed         DISPOSITION: Home DIET: Carb modified diet ACTIVITY: As tolerated   DISCHARGE FOLLOW-UP Follow-up Information   Follow up with [facility], MD. Schedule an appointment as soon as possible for a visit in 1 week. (for follow-up)    Contact information:    [address] [place] [address] [phone]       Time spent on Discharge: 37 mins  Signed:   Zakye Baby M.D. [facility] 04/15/2013, 11:48 AM Pager: [phone]

## 2013-04-15 NOTE — Progress Notes (Signed)
Pt.refused CPAP at this time. States It isn't like her machine at home and she doesn't like it.

## 2013-04-26 ENCOUNTER — Encounter (HOSPITAL_COMMUNITY): Payer: Self-pay | Admitting: Emergency Medicine

## 2013-04-26 ENCOUNTER — Emergency Department (HOSPITAL_COMMUNITY): Payer: Medicaid Other

## 2013-04-26 ENCOUNTER — Inpatient Hospital Stay (HOSPITAL_COMMUNITY): Payer: Medicaid Other

## 2013-04-26 ENCOUNTER — Inpatient Hospital Stay (HOSPITAL_COMMUNITY)
Admission: EM | Admit: 2013-04-26 | Discharge: 2013-04-30 | DRG: 190 | Disposition: A | Discharge status: Home Health | Payer: Medicaid Other | Attending: Internal Medicine | Admitting: Internal Medicine

## 2013-04-26 DIAGNOSIS — J449 Chronic obstructive pulmonary disease, unspecified: Secondary | ICD-10-CM

## 2013-04-26 DIAGNOSIS — J9620 Acute and chronic respiratory failure, unspecified whether with hypoxia or hypercapnia: Secondary | ICD-10-CM | POA: Diagnosis present

## 2013-04-26 DIAGNOSIS — R911 Solitary pulmonary nodule: Secondary | ICD-10-CM

## 2013-04-26 DIAGNOSIS — Z9981 Dependence on supplemental oxygen: Secondary | ICD-10-CM

## 2013-04-26 DIAGNOSIS — R490 Dysphonia: Secondary | ICD-10-CM

## 2013-04-26 DIAGNOSIS — F411 Generalized anxiety disorder: Secondary | ICD-10-CM | POA: Diagnosis present

## 2013-04-26 DIAGNOSIS — J209 Acute bronchitis, unspecified: Secondary | ICD-10-CM

## 2013-04-26 DIAGNOSIS — Z87891 Personal history of nicotine dependence: Secondary | ICD-10-CM | POA: Diagnosis present

## 2013-04-26 DIAGNOSIS — D649 Anemia, unspecified: Secondary | ICD-10-CM | POA: Diagnosis present

## 2013-04-26 DIAGNOSIS — R51 Headache: Secondary | ICD-10-CM | POA: Diagnosis present

## 2013-04-26 DIAGNOSIS — R040 Epistaxis: Secondary | ICD-10-CM

## 2013-04-26 DIAGNOSIS — R079 Chest pain, unspecified: Secondary | ICD-10-CM | POA: Diagnosis present

## 2013-04-26 DIAGNOSIS — I10 Essential (primary) hypertension: Secondary | ICD-10-CM | POA: Diagnosis present

## 2013-04-26 DIAGNOSIS — T380X5A Adverse effect of glucocorticoids and synthetic analogues, initial encounter: Secondary | ICD-10-CM | POA: Diagnosis present

## 2013-04-26 DIAGNOSIS — E119 Type 2 diabetes mellitus without complications: Secondary | ICD-10-CM | POA: Diagnosis present

## 2013-04-26 DIAGNOSIS — I5189 Other ill-defined heart diseases: Secondary | ICD-10-CM | POA: Diagnosis present

## 2013-04-26 DIAGNOSIS — Z720 Tobacco use: Secondary | ICD-10-CM

## 2013-04-26 DIAGNOSIS — J309 Allergic rhinitis, unspecified: Secondary | ICD-10-CM

## 2013-04-26 DIAGNOSIS — D573 Sickle-cell trait: Secondary | ICD-10-CM | POA: Diagnosis present

## 2013-04-26 DIAGNOSIS — G4733 Obstructive sleep apnea (adult) (pediatric): Secondary | ICD-10-CM | POA: Diagnosis present

## 2013-04-26 DIAGNOSIS — K219 Gastro-esophageal reflux disease without esophagitis: Secondary | ICD-10-CM | POA: Diagnosis present

## 2013-04-26 DIAGNOSIS — I5032 Chronic diastolic (congestive) heart failure: Secondary | ICD-10-CM

## 2013-04-26 DIAGNOSIS — Z79899 Other long term (current) drug therapy: Secondary | ICD-10-CM

## 2013-04-26 DIAGNOSIS — D72829 Elevated white blood cell count, unspecified: Secondary | ICD-10-CM | POA: Diagnosis not present

## 2013-04-26 DIAGNOSIS — F17200 Nicotine dependence, unspecified, uncomplicated: Secondary | ICD-10-CM | POA: Diagnosis present

## 2013-04-26 DIAGNOSIS — J441 Chronic obstructive pulmonary disease with (acute) exacerbation: Principal | ICD-10-CM | POA: Diagnosis present

## 2013-04-26 LAB — COMPREHENSIVE METABOLIC PANEL
AST: 138 U/L — ABNORMAL HIGH (ref 0–37)
Albumin: 3.4 g/dL — ABNORMAL LOW (ref 3.5–5.2)
Chloride: 100 mEq/L (ref 96–112)
Creatinine, Ser: 0.81 mg/dL (ref 0.50–1.10)
Sodium: 140 mEq/L (ref 135–145)
Total Bilirubin: 0.2 mg/dL — ABNORMAL LOW (ref 0.3–1.2)

## 2013-04-26 LAB — CBC WITH DIFFERENTIAL/PLATELET
Basophils Absolute: 0 10*3/uL (ref 0.0–0.1)
Basophils Relative: 0 % (ref 0–1)
MCHC: 35.1 g/dL (ref 30.0–36.0)
Monocytes Absolute: 0.5 10*3/uL (ref 0.1–1.0)
Neutro Abs: 7.8 10*3/uL — ABNORMAL HIGH (ref 1.7–7.7)
Neutrophils Relative %: 71 % (ref 43–77)
Platelets: 197 10*3/uL (ref 150–400)
RDW: 15.2 % (ref 11.5–15.5)

## 2013-04-26 MED ORDER — ONDANSETRON HCL 4 MG PO TABS: 4.0000 mg | ORAL_TABLET | Freq: Four times a day (QID) | ORAL | Status: DC | PRN

## 2013-04-26 MED ORDER — ALBUTEROL SULFATE (5 MG/ML) 0.5% IN NEBU: 2.5000 mg | INHALATION_SOLUTION | RESPIRATORY_TRACT | Status: DC | PRN

## 2013-04-26 MED ORDER — NITROGLYCERIN 0.4 MG SL SUBL
0.4000 mg | SUBLINGUAL_TABLET | SUBLINGUAL | Status: DC | PRN
  Administered 2013-04-27 (×2): 0.4 mg via SUBLINGUAL
  Filled 2013-04-26: qty 25

## 2013-04-26 MED ORDER — LEVOFLOXACIN IN D5W 750 MG/150ML IV SOLN
750.0000 mg | Freq: Every day | INTRAVENOUS | Status: DC
  Administered 2013-04-27 – 2013-04-28 (×2): 750 mg via INTRAVENOUS
  Filled 2013-04-26 (×4): qty 150

## 2013-04-26 MED ORDER — LORAZEPAM 1 MG PO TABS
1.0000 mg | ORAL_TABLET | Freq: Four times a day (QID) | ORAL | Status: DC | PRN
  Administered 2013-04-27 – 2013-04-28 (×4): 1 mg via ORAL
  Filled 2013-04-26: qty 2
  Filled 2013-04-26: qty 1
  Filled 2013-04-26: qty 2
  Filled 2013-04-26: qty 1
  Filled 2013-04-26: qty 2

## 2013-04-26 MED ORDER — ONDANSETRON HCL 4 MG/2ML IJ SOLN: 4.0000 mg | Freq: Four times a day (QID) | INTRAMUSCULAR | Status: DC | PRN

## 2013-04-26 MED ORDER — PANTOPRAZOLE SODIUM 40 MG IV SOLR
40.0000 mg | Freq: Every day | INTRAVENOUS | Status: DC
  Administered 2013-04-27: 40 mg via INTRAVENOUS
  Filled 2013-04-26 (×2): qty 40

## 2013-04-26 MED ORDER — ASPIRIN EC 81 MG PO TBEC
81.0000 mg | DELAYED_RELEASE_TABLET | Freq: Every day | ORAL | Status: DC
  Administered 2013-04-27 – 2013-04-30 (×4): 81 mg via ORAL
  Filled 2013-04-26 (×4): qty 1

## 2013-04-26 MED ORDER — SODIUM CHLORIDE 0.9 % IV SOLN
INTRAVENOUS | Status: DC
  Administered 2013-04-26 – 2013-04-27 (×2): via INTRAVENOUS

## 2013-04-26 MED ORDER — IPRATROPIUM BROMIDE 0.02 % IN SOLN
0.5000 mg | RESPIRATORY_TRACT | Status: DC
  Administered 2013-04-27 (×5): 0.5 mg via RESPIRATORY_TRACT
  Filled 2013-04-26 (×5): qty 2.5

## 2013-04-26 MED ORDER — SODIUM CHLORIDE 0.9 % IV SOLN: INTRAVENOUS | Status: DC

## 2013-04-26 MED ORDER — METHYLPREDNISOLONE SODIUM SUCC 125 MG IJ SOLR
125.0000 mg | Freq: Once | INTRAMUSCULAR | Status: AC
  Administered 2013-04-26: 125 mg via INTRAVENOUS
  Filled 2013-04-26: qty 2

## 2013-04-26 MED ORDER — METHYLPREDNISOLONE SODIUM SUCC 125 MG IJ SOLR
125.0000 mg | Freq: Two times a day (BID) | INTRAMUSCULAR | Status: DC
  Administered 2013-04-27 (×2): 125 mg via INTRAVENOUS
  Filled 2013-04-26 (×3): qty 2

## 2013-04-26 MED ORDER — HEPARIN BOLUS VIA INFUSION
4000.0000 [IU] | Freq: Once | INTRAVENOUS | Status: AC
  Administered 2013-04-26: 4000 [IU] via INTRAVENOUS
  Filled 2013-04-26: qty 4000

## 2013-04-26 MED ORDER — MORPHINE SULFATE 2 MG/ML IJ SOLN
2.0000 mg | INTRAMUSCULAR | Status: DC | PRN
  Administered 2013-04-26: 2 mg via INTRAVENOUS
  Filled 2013-04-26: qty 1

## 2013-04-26 MED ORDER — ATORVASTATIN CALCIUM 80 MG PO TABS
80.0000 mg | ORAL_TABLET | Freq: Every day | ORAL | Status: DC
  Administered 2013-04-26 – 2013-04-29 (×4): 80 mg via ORAL
  Filled 2013-04-26 (×5): qty 1

## 2013-04-26 MED ORDER — LEVOFLOXACIN IN D5W 500 MG/100ML IV SOLN
500.0000 mg | Freq: Once | INTRAVENOUS | Status: AC
  Administered 2013-04-26: 500 mg via INTRAVENOUS
  Filled 2013-04-26: qty 100

## 2013-04-26 MED ORDER — ALBUTEROL (5 MG/ML) CONTINUOUS INHALATION SOLN
10.0000 mg/h | INHALATION_SOLUTION | RESPIRATORY_TRACT | Status: DC
  Administered 2013-04-26: 10 mg/h via RESPIRATORY_TRACT
  Filled 2013-04-26: qty 20

## 2013-04-26 MED ORDER — MORPHINE SULFATE 4 MG/ML IJ SOLN
4.0000 mg | Freq: Once | INTRAMUSCULAR | Status: AC
  Administered 2013-04-26: 4 mg via INTRAVENOUS
  Filled 2013-04-26: qty 1

## 2013-04-26 MED ORDER — ALBUTEROL SULFATE (5 MG/ML) 0.5% IN NEBU
2.5000 mg | INHALATION_SOLUTION | RESPIRATORY_TRACT | Status: DC
  Administered 2013-04-27 (×5): 2.5 mg via RESPIRATORY_TRACT
  Filled 2013-04-26 (×5): qty 0.5

## 2013-04-26 MED ORDER — HEPARIN (PORCINE) IN NACL 100-0.45 UNIT/ML-% IJ SOLN
1100.0000 [IU]/h | INTRAMUSCULAR | Status: DC
  Administered 2013-04-26: 900 [IU]/h via INTRAVENOUS
  Administered 2013-04-27: 1100 [IU]/h via INTRAVENOUS
  Filled 2013-04-26 (×2): qty 250

## 2013-04-26 NOTE — ED Provider Notes (Signed)
History     CSN: [id number]  Arrival date & time 04/26/13  1648   First MD Initiated Contact with Patient 04/26/13 1649      Chief Complaint  Patient presents with  . Shortness of Breath    (Consider location/radiation/quality/duration/timing/severity/associated sxs/prior treatment) HPI Pt admitted 6/2 for COPD exacerbation. States she left 6/11 AMA. She has had persistent and worsening SOB with productive cough. No fever or chills. Pt came by EMS and was given neb with no improvement. States she is on 2 liter [place] at home.  Past Medical History  Diagnosis Date  . Borderline diabetes   . Hypertension   . Anxiety   . Emphysema   . COPD (chronic obstructive pulmonary disease)   . OSA on CPAP     CPAP at night   . Dizziness     pt believes this is motion sickness or vertigo  . GERD (gastroesophageal reflux disease)   . Headache(784.0)   . Cancer 1990    cervical   . Anemia   . Morbid obesity   . Sickle cell trait   . Hx of cardiac catheterization     a. LHC at [facility] in [place], [place] 09/2008:  Normal coronary arteries EF 70%.  . Tobacco abuse     a. up to 3ppd from age 11 to 6, now 1/4 ppd (01/2013)    Past Surgical History  Procedure Laterality Date  . Tubal ligation    . Cardiac catheterization    . Colonoscopy  09/05/2012    Procedure: COLONOSCOPY;  Surgeon: Theda Belfast, MD;  Location: [facility];  Service: Endoscopy;  Laterality: N/A;    Family History  Problem Relation Age of Onset  . Lung cancer Paternal Aunt   . Lung cancer Paternal Grandfather   . Other Father     unaware of father's medical history  . Diabetes Mother     alive @ 4  . Other      multiple siblings a&w.    History  Substance Use Topics  . Smoking status: Current Every Day Smoker -- 0.30 packs/day for 36 years    Types: Cigarettes    Last Attempt to Quit: 05/14/2012  . Smokeless tobacco: Never Used     Comment: Approx 90 pk-yrs (up to 3ppd until ~ 2009). Smoking 5 cigs per day  now.  . Alcohol Use: No    OB History   Grav Para Term Preterm Abortions TAB SAB Ect Mult Living                  Review of Systems  Constitutional: Negative for fever and chills.  Respiratory: Positive for cough, shortness of breath and wheezing. Negative for chest tightness.   Cardiovascular: Negative for chest pain, palpitations and leg swelling.  Gastrointestinal: Negative for nausea, vomiting and abdominal pain.  Musculoskeletal: Negative for myalgias and back pain.  Skin: Negative for rash and wound.  Neurological: Negative for dizziness, weakness, light-headedness, numbness and headaches.  All other systems reviewed and are negative.    Allergies  Review of patient's allergies indicates no known allergies.  Home Medications   Current Outpatient Rx  Name  Route  Sig  Dispense  Refill  . albuterol (PROVENTIL HFA;VENTOLIN HFA) 108 (90 BASE) MCG/ACT inhaler   Inhalation   Inhale 2 puffs into the lungs every 4 (four) hours as needed for wheezing.   1 Inhaler   3   . albuterol (PROVENTIL) (2.5 MG/3ML) 0.083% nebulizer solution  Nebulization   Take 3 mLs (2.5 mg total) by nebulization 4 (four) times daily. DX: 496. For wheezing   75 mL   12   . cyclobenzaprine (FLEXERIL) 10 MG tablet   Oral   Take 10 mg by mouth 2 (two) times daily as needed for muscle spasms.         . diazepam (VALIUM) 10 MG tablet   Oral   Take 10 mg by mouth at bedtime.         Marland Kitchen doxycycline (VIBRA-TABS) 100 MG tablet   Oral   Take 1 tablet (100 mg total) by mouth every 12 (twelve) hours. X 10days   20 tablet   0   . fluticasone (FLONASE) 50 MCG/ACT nasal spray   Nasal   Place 2 sprays into the nose 2 (two) times daily.   16 g   11   . guaiFENesin (MUCINEX) 600 MG 12 hr tablet   Oral   Take 1 tablet (600 mg total) by mouth 2 (two) times daily.   30 tablet   0   . loratadine (CLARITIN) 10 MG tablet      take 1 tablet by mouth once daily   30 tablet   5   . miconazole  (MICOTIN) 2 % cream   Topical   Apply 1 application topically 2 (two) times daily.         Marland Kitchen omeprazole (PRILOSEC) 20 MG capsule   Oral   Take 20 mg by mouth 2 (two) times daily.         . potassium chloride SA (K-DUR,KLOR-CON) 20 MEQ tablet   Oral   Take 20 mEq by mouth See admin instructions. Sometimes 20 MEQ takes once daily, but usually 20 MEQ twice daily.         Marland Kitchen SPIRIVA HANDIHALER 18 MCG inhalation capsule      inhale contents of 1 capsule by mouth once daily   30 each   5   . SYMBICORT 160-4.5 MCG/ACT inhaler      inhale 2 puffs by mouth twice a day   10.2 g   6   . triamterene-hydrochlorothiazide (DYAZIDE) 37.5-25 MG per capsule      take 1 capsule by mouth once daily   30 capsule   3   . vitamin B-12 (CYANOCOBALAMIN) 500 MCG tablet   Oral   Take 500 mcg by mouth daily.         Marland Kitchen aspirin EC 81 MG tablet   Oral   Take 81 mg by mouth daily.         . predniSONE (STERAPRED UNI-PAK) 10 MG tablet      Take Prednisone 40mg  (4 tabs) x 3 days, then  30mg  (3 tabs) x 3 days, then 20mg  (2 tabs) x 3days, then 10mg  (1 tab) x 3days, then OFF.   30 tablet   0     BP 158/53  Pulse 110  Temp(Src) 98.5 F (36.9 C)  SpO2 97%  LMP 06/04/2000  Physical Exam  Nursing note and vitals reviewed. Constitutional: She is oriented to person, place, and time. She appears well-developed and well-nourished. No distress.  HENT:  Head: Normocephalic and atraumatic.  Mouth/Throat: Oropharynx is clear and moist.  Eyes: EOM are normal. Pupils are equal, round, and reactive to light.  Neck: Normal range of motion. Neck supple.  Cardiovascular: Normal rate and regular rhythm.   Pulmonary/Chest: Effort normal. No respiratory distress. She has wheezes. She has no rales.  Decreased  air movement with diffuse high pitched wheezes  Abdominal: Soft. Bowel sounds are normal. She exhibits no distension and no mass. There is no tenderness. There is no rebound and no guarding.   Musculoskeletal: Normal range of motion. She exhibits no edema and no tenderness.  Neurological: She is alert and oriented to person, place, and time.  Moves all ext without deficit, sensation intact  Skin: Skin is warm and dry. No rash noted. No erythema.  Psychiatric: She has a normal mood and affect. Her behavior is normal.    ED Course  Procedures (including critical care time)  Labs Reviewed  CBC WITH DIFFERENTIAL - Abnormal; Notable for the following:    WBC 10.9 (*)    Neutro Abs 7.8 (*)    All other components within normal limits  COMPREHENSIVE METABOLIC PANEL - Abnormal; Notable for the following:    Glucose, Bld 121 (*)    Albumin 3.4 (*)    AST 138 (*)    ALT 250 (*)    Total Bilirubin 0.2 (*)    GFR calc non Af Amer 81 (*)    All other components within normal limits  TROPONIN I   Dg Chest Port 1 View  04/26/2013   *RADIOLOGY REPORT*  Clinical Data: Shortness of breath  PORTABLE CHEST - 1 VIEW  Comparison: 04/13/2013  Findings: Cardiomediastinal silhouette is stable.  No acute infiltrate or pleural effusion.  No pulmonary edema.  Stable chronic mild interstitial prominence.  IMPRESSION: No active disease.  No significant change.   Original Report Authenticated By: Natasha Mead, M.D.     1. COPD exacerbation      Date: 04/26/2013  Rate:108  Rhythm: sinus tachycardia  QRS Axis: normal  Intervals: normal  ST/T Wave abnormalities: normal  Conduction Disutrbances:none  Narrative Interpretation:   Old EKG Reviewed: unchanged    MDM  Pt with persistent wheezing and decreased air movement despite continuous neb and steroids. Will discuss with [facility].         Loren Racer, MD 04/26/13 Ebony Cargo

## 2013-04-26 NOTE — H&P (Signed)
History and Physical  Stephanie Schultz ZOX:[id number] DOB: 06-05-1956 DOA: 04/26/2013  Referring physician: Dr. Ranae Palms PCP: Lonia Blood, MD   Chief Complaint: Shortness of breath  HPI: Stephanie Schultz is a 57 y.o. female with past medical history most significant for hypertension, diabetes, anxiety, COPD on home oxygen, obstructive sleep apnea, reflux disease, morbid obesity and ongoing tobacco use comes in with chief complaint of progressive shortness of breath . Patient was recently discharged from the hospital on June 4th. Patient said that she has been taking all her medications as prescribed including all her inhalers but her shortness of breath progressively got worse over last one and a half week. Shortness of breath was associated with progressive productive cough with yellow sputum and also increased use of her inhalers. Patient denies any fever or chills.   Patient's chest x-ray and initial labs are unremarkable for any acute lung infection. Patient's shortness of breath was unrelieved with multiple nebulizer treatments in the ER. At the time of visit patient started having 8/10 chest pain, pressure like sensation, present in the center of the chest, radiating to left arm, no exacerbating or relieving factors, unrelated with deep breathing. Patient was given morphine in the ER which relieved the pain somewhat.   It was decided to admit the patient to step down unit because of ongoing chest pain despite initial troponins and EKG being negative.    Chart Review: Patient had cardiac catheterization in 2009 with suggested normal coronary arteries.  Review of Systems:  15 point review of system was negative except for as noted above in the history of present illness.  Past Medical History  Diagnosis Date  . Borderline diabetes   . Hypertension   . Anxiety   . Emphysema   . COPD (chronic obstructive pulmonary disease)   . OSA on CPAP     CPAP at night   . Dizziness     pt believes this  is motion sickness or vertigo  . GERD (gastroesophageal reflux disease)   . Headache(784.0)   . Cancer 1990    cervical   . Anemia   . Morbid obesity   . Sickle cell trait   . Hx of cardiac catheterization     a. LHC at [facility] in [place], [place] 09/2008:  Normal coronary arteries EF 70%.  . Tobacco abuse     a. up to 3ppd from age 53 to 89, now 1/4 ppd (01/2013)    Past Surgical History  Procedure Laterality Date  . Tubal ligation    . Cardiac catheterization    . Colonoscopy  09/05/2012    Procedure: COLONOSCOPY;  Surgeon: Theda Belfast, MD;  Location: [facility];  Service: Endoscopy;  Laterality: N/A;    Social History:  reports that she has been smoking Cigarettes.  She has a 10.8 pack-year smoking history. She has never used smokeless tobacco. She reports that she does not drink alcohol or use illicit drugs.  No Known Allergies  Family History  Problem Relation Age of Onset  . Lung cancer Paternal Aunt   . Lung cancer Paternal Grandfather   . Other Father     unaware of father's medical history  . Diabetes Mother     alive @ 79  . Other      multiple siblings a&w.     Prior to Admission medications   Medication Sig Start Date End Date Taking? Authorizing Provider  albuterol (PROVENTIL HFA;VENTOLIN HFA) 108 (90 BASE) MCG/ACT inhaler Inhale 2 puffs into the lungs  every 4 (four) hours as needed for wheezing. 03/11/13 03/11/14 Yes Leslye Peer, MD  albuterol (PROVENTIL) (2.5 MG/3ML) 0.083% nebulizer solution Take 3 mLs (2.5 mg total) by nebulization 4 (four) times daily. DX: 496. For wheezing 04/15/13 04/15/14 Yes Ripudeep Jenna Luo, MD  cyclobenzaprine (FLEXERIL) 10 MG tablet Take 10 mg by mouth 2 (two) times daily as needed for muscle spasms.   Yes Historical Provider, MD  diazepam (VALIUM) 10 MG tablet Take 10 mg by mouth at bedtime.   Yes Historical Provider, MD  doxycycline (VIBRA-TABS) 100 MG tablet Take 1 tablet (100 mg total) by mouth every 12 (twelve) hours. X 10days  04/15/13  Yes Ripudeep K Rai, MD  fluticasone (FLONASE) 50 MCG/ACT nasal spray Place 2 sprays into the nose 2 (two) times daily. 07/08/12 07/08/13 Yes Leslye Peer, MD  guaiFENesin (MUCINEX) 600 MG 12 hr tablet Take 1 tablet (600 mg total) by mouth 2 (two) times daily. 04/15/13  Yes Ripudeep Jenna Luo, MD  loratadine (CLARITIN) 10 MG tablet take 1 tablet by mouth once daily 11/14/12  Yes Leslye Peer, MD  miconazole (MICOTIN) 2 % cream Apply 1 application topically 2 (two) times daily.   Yes Historical Provider, MD  omeprazole (PRILOSEC) 20 MG capsule Take 20 mg by mouth 2 (two) times daily.   Yes Historical Provider, MD  potassium chloride SA (K-DUR,KLOR-CON) 20 MEQ tablet Take 20 mEq by mouth See admin instructions. Sometimes 20 MEQ takes once daily, but usually 20 MEQ twice daily.   Yes Historical Provider, MD  SPIRIVA HANDIHALER 18 MCG inhalation capsule inhale contents of 1 capsule by mouth once daily 12/15/12  Yes Leslye Peer, MD  SYMBICORT 160-4.5 MCG/ACT inhaler inhale 2 puffs by mouth twice a day 04/10/13  Yes Leslye Peer, MD  triamterene-hydrochlorothiazide (DYAZIDE) 37.5-25 MG per capsule take 1 capsule by mouth once daily 08/11/12  Yes Leslye Peer, MD  vitamin B-12 (CYANOCOBALAMIN) 500 MCG tablet Take 500 mcg by mouth daily.   Yes Historical Provider, MD  aspirin EC 81 MG tablet Take 81 mg by mouth daily.    Historical Provider, MD  predniSONE (STERAPRED UNI-PAK) 10 MG tablet Take Prednisone 40mg  (4 tabs) x 3 days, then  30mg  (3 tabs) x 3 days, then 20mg  (2 tabs) x 3days, then 10mg  (1 tab) x 3days, then OFF. 04/15/13   Ripudeep Jenna Luo, MD   Physical Exam: Filed Vitals:   04/26/13 1930 04/26/13 2030 04/26/13 2130 04/26/13 2205  BP: 119/75 135/51 133/89 140/69  Pulse: 124 122 125 124  Temp:    97.9 F (36.6 C)  TempSrc:    Oral  Resp: 29 25 17 28   SpO2: 94% 94% 93% 95%   Physical Exam: General: Vital signs reviewed and noted. Well-developed, well-nourished, in no acute distress; alert,  appropriate and cooperative throughout examination.patient was somewhat tearful do to discomfort from chest pain   Head: Normocephalic, atraumatic.  Eyes: PERRL, EOMI, No signs of anemia or jaundince.  Nose: Mucous membranes moist, not inflammed, nonerythematous.  Throat: Oropharynx nonerythematous, no exudate appreciated.   Neck: No deformities, masses, or tenderness noted.Supple, No carotid Bruits, no JVD.  Lungs:  Normal respiratory effort. Diffuse expiratory wheezes noted throughout bilateral lung fields   Heart: RRR. S1 and S2 normal without gallop, murmur, or rubs.  Abdomen:  BS normoactive. Soft, Nondistended, non-tender.  No masses or organomegaly.  Extremities: No pretibial edema.  Neurologic: A&O X3, CN II - XII are grossly intact. Motor strength is  5/5 in the all 4 extremities, Sensations intact to light touch, Cerebellar signs negative.  Skin: No visible rashes, scars.     Wt Readings from Last 3 Encounters:  04/13/13 206 lb 5.6 oz (93.6 kg)  03/11/13 216 lb 3.2 oz (98.068 kg)  02/26/13 212 lb 12.8 oz (96.525 kg)    Labs on Admission:  Basic Metabolic Panel:  Recent Labs Lab 04/26/13 1748  NA 140  K 4.1  CL 100  CO2 30  GLUCOSE 121*  BUN 17  CREATININE 0.81  CALCIUM 9.8    Liver Function Tests:  Recent Labs Lab 04/26/13 1748  AST 138*  ALT 250*  ALKPHOS 64  BILITOT 0.2*  PROT 7.2  ALBUMIN 3.4*   CBC:  Recent Labs Lab 04/26/13 1748  WBC 10.9*  NEUTROABS 7.8*  HGB 13.3  HCT 37.9  MCV 83.7  PLT 197    Cardiac Enzymes:  Recent Labs Lab 04/26/13 1748  TROPONINI <0.30    BNP (last 3 results)  Recent Labs  01/17/13 1410  PROBNP 103.2    Radiological Exams on Admission: Dg Chest Port 1 View  04/26/2013   *RADIOLOGY REPORT*  Clinical Data: Shortness of breath  PORTABLE CHEST - 1 VIEW  Comparison: 04/13/2013  Findings: Cardiomediastinal silhouette is stable.  No acute infiltrate or pleural effusion.  No pulmonary edema.  Stable chronic  mild interstitial prominence.  IMPRESSION: No active disease.  No significant change.   Original Report Authenticated By: Natasha Mead, M.D.    EKG: Independently reviewed. 108 beats per minute, normal axis, sinus rhythm, nonspecific T wave changes in the lateral leads that is leading 1 and aVL which was also seen in previous EKG done on June 5   Principal Problem:   COPD exacerbation Active Problems:   COPD (chronic obstructive pulmonary disease)   Diabetes mellitus   Hypertension   Obstructive sleep apnea   GERD (gastroesophageal reflux disease)   Tobacco abuse   Chest pain   Sickle cell trait   Morbid obesity   Assessment/Plan  patient 57 year old female with past medical history most significant for severe COPD who was admitted again after recently being discharged for COPD exacerbation .   1. COPD exacerbation : Admit to step down as patient is having concurrent chest pain  -IV steroids  -Levaquin IV  -Albuterol and Atrovent every 4 hours scheduled  -Reassess in the morning  -No need for BiPAP at this time  -Since primary disease is emphysema patient has significant air trapping and I believe that anxiety is playing a big trouble in her exacerbation, hence I will start the patient on Ativan 1 mg when necessary every 6 hours  2. chest pain: Although patient had normal troponins and normal EKG but the description of chest pain is  typical for ACS/unstable angina -We'll admit the patient to step down -I will start heparin drip -Aspirin, statin, avoid beta blocker due to COPD exacerbation -Check fasting lipid panel and TSH -Nitrate, morphine, oxygen  3. type 2 diabetes -Sliding scale insulin  4. reflux disease: -IV Protonix  I will withhold rest of the medications at this time and restart oral medications in the morning.  Code Status:  full code  Family Communication:  None present at bedside Disposition Plan/Anticipated LOS:  2 days   Time spent:  75  minutes  Lars Mage, MD  [facility] Team 5  If 7PM-7AM, please contact night-coverage at [email or web address], password [facility] 04/26/2013, 10:23 PM

## 2013-04-26 NOTE — ED Notes (Signed)
Pt here from home with c/o sob ,pt wheezing upon arrival , neb  Going upon arrival

## 2013-04-26 NOTE — Progress Notes (Signed)
ANTICOAGULATION CONSULT NOTE - Initial Consult  Pharmacy Consult for Heparin Indication: chest pain/ACS  No Known Allergies  Patient Measurements:    Ht: 59 in    Wt: ~93 kg  IBW: 43 kg Heparin Dosing Weight: 65 kg  Vital Signs: Temp: 97.9 F (36.6 C) (06/15 2205) Temp src: Oral (06/15 2205) BP: 155/81 mmHg (06/15 2230) Pulse Rate: 123 (06/15 2230)  Labs:  Recent Labs  04/26/13 1748  HGB 13.3  HCT 37.9  PLT 197  CREATININE 0.81  TROPONINI <0.30    The CrCl is unknown because both a height and weight (above a minimum accepted value) are required for this calculation.   Medical History: Past Medical History  Diagnosis Date  . Borderline diabetes   . Hypertension   . Anxiety   . Emphysema   . COPD (chronic obstructive pulmonary disease)   . OSA on CPAP     CPAP at night   . Dizziness     pt believes this is motion sickness or vertigo  . GERD (gastroesophageal reflux disease)   . Headache(784.0)   . Cancer 1990    cervical   . Anemia   . Morbid obesity   . Sickle cell trait   . Hx of cardiac catheterization     a. LHC at [facility] in [place], [place] 09/2008:  Normal coronary arteries EF 70%.  . Tobacco abuse     a. up to 3ppd from age 36 to 12, now 1/4 ppd (01/2013)    Medications:  Prescriptions prior to admission  Medication Sig Dispense Refill  . albuterol (PROVENTIL HFA;VENTOLIN HFA) 108 (90 BASE) MCG/ACT inhaler Inhale 2 puffs into the lungs every 4 (four) hours as needed for wheezing.  1 Inhaler  3  . albuterol (PROVENTIL) (2.5 MG/3ML) 0.083% nebulizer solution Take 3 mLs (2.5 mg total) by nebulization 4 (four) times daily. DX: 496. For wheezing  75 mL  12  . cyclobenzaprine (FLEXERIL) 10 MG tablet Take 10 mg by mouth 2 (two) times daily as needed for muscle spasms.      . diazepam (VALIUM) 10 MG tablet Take 10 mg by mouth at bedtime.      Marland Kitchen doxycycline (VIBRA-TABS) 100 MG tablet Take 1 tablet (100 mg total) by mouth every 12 (twelve) hours. X 10days  20  tablet  0  . fluticasone (FLONASE) 50 MCG/ACT nasal spray Place 2 sprays into the nose 2 (two) times daily.  16 g  11  . guaiFENesin (MUCINEX) 600 MG 12 hr tablet Take 1 tablet (600 mg total) by mouth 2 (two) times daily.  30 tablet  0  . loratadine (CLARITIN) 10 MG tablet take 1 tablet by mouth once daily  30 tablet  5  . miconazole (MICOTIN) 2 % cream Apply 1 application topically 2 (two) times daily.      Marland Kitchen omeprazole (PRILOSEC) 20 MG capsule Take 20 mg by mouth 2 (two) times daily.      . potassium chloride SA (K-DUR,KLOR-CON) 20 MEQ tablet Take 20 mEq by mouth See admin instructions. Sometimes 20 MEQ takes once daily, but usually 20 MEQ twice daily.      Marland Kitchen SPIRIVA HANDIHALER 18 MCG inhalation capsule inhale contents of 1 capsule by mouth once daily  30 each  5  . SYMBICORT 160-4.5 MCG/ACT inhaler inhale 2 puffs by mouth twice a day  10.2 g  6  . triamterene-hydrochlorothiazide (DYAZIDE) 37.5-25 MG per capsule take 1 capsule by mouth once daily  30 capsule  3  . vitamin B-12 (CYANOCOBALAMIN) 500 MCG tablet Take 500 mcg by mouth daily.      Marland Kitchen aspirin EC 81 MG tablet Take 81 mg by mouth daily.      . predniSONE (STERAPRED UNI-PAK) 10 MG tablet Take Prednisone 40mg  (4 tabs) x 3 days, then  30mg  (3 tabs) x 3 days, then 20mg  (2 tabs) x 3days, then 10mg  (1 tab) x 3days, then OFF.  30 tablet  0    Assessment: 57 y.o. female presents with SOB, chest pain. Recently d/c from hospital 6/4 for COPD exacerbation. To begin heparin for r/o ACS. CBC stable at baseline.  Goal of Therapy:  Heparin level 0.3-0.7 units/ml Monitor platelets by anticoagulation protocol: Yes   Plan:  1. Heparin IV bolus 4000 units 2. Heparin gtt at 900 units/hr 3. F/u 6 hr heparin level  4. Daily heparin level and CBC  Christoper Fabian, PharmD, BCPS Clinical pharmacist, pager [phone] 04/26/2013,10:47 PM

## 2013-04-27 ENCOUNTER — Encounter (HOSPITAL_COMMUNITY): Payer: Self-pay | Admitting: *Deleted

## 2013-04-27 DIAGNOSIS — D72829 Elevated white blood cell count, unspecified: Secondary | ICD-10-CM | POA: Diagnosis not present

## 2013-04-27 LAB — TROPONIN I
Troponin I: <0.3 ng/mL (ref <0.30)
Troponin I: <0.3 ng/mL (ref <0.30)

## 2013-04-27 LAB — LIPID PANEL: HDL: 81 mg/dL (ref >39)

## 2013-04-27 LAB — TSH: TSH: 0.516 u[IU]/mL (ref 0.350–4.500)

## 2013-04-27 LAB — CBC
Hemoglobin: 12.3 g/dL (ref 12.0–15.0)
MCH: 29.4 pg (ref 26.0–34.0)
RBC: 4.19 MIL/uL (ref 3.87–5.11)

## 2013-04-27 LAB — D-DIMER, QUANTITATIVE: D-Dimer, Quant: 0.39 ug/mL-FEU (ref 0.00–0.48)

## 2013-04-27 LAB — HEPARIN LEVEL (UNFRACTIONATED)
Heparin Unfractionated: <0.1 [IU]/mL — ABNORMAL LOW (ref 0.30–0.70)
Heparin Unfractionated: 0.13 IU/mL — ABNORMAL LOW (ref 0.30–0.70)

## 2013-04-27 MED ORDER — FLUTICASONE PROPIONATE 50 MCG/ACT NA SUSP
2.0000 | Freq: Two times a day (BID) | NASAL | Status: DC
  Administered 2013-04-27 – 2013-04-30 (×6): 2 via NASAL
  Filled 2013-04-27 (×2): qty 16

## 2013-04-27 MED ORDER — HEPARIN SODIUM (PORCINE) 5000 UNIT/ML IJ SOLN
5000.0000 [IU] | Freq: Three times a day (TID) | INTRAMUSCULAR | Status: DC
  Administered 2013-04-28 – 2013-04-30 (×7): 5000 [IU] via SUBCUTANEOUS
  Filled 2013-04-27 (×11): qty 1

## 2013-04-27 MED ORDER — LORATADINE 10 MG PO TABS
10.0000 mg | ORAL_TABLET | Freq: Every day | ORAL | Status: DC
  Administered 2013-04-27 – 2013-04-30 (×4): 10 mg via ORAL
  Filled 2013-04-27 (×4): qty 1

## 2013-04-27 MED ORDER — HEPARIN BOLUS VIA INFUSION
2000.0000 [IU] | Freq: Once | INTRAVENOUS | Status: AC
  Administered 2013-04-27: 2000 [IU] via INTRAVENOUS
  Filled 2013-04-27: qty 2000

## 2013-04-27 MED ORDER — HEPARIN (PORCINE) IN NACL 100-0.45 UNIT/ML-% IJ SOLN
1350.0000 [IU]/h | INTRAMUSCULAR | Status: DC
  Administered 2013-04-27: 1350 [IU]/h via INTRAVENOUS
  Filled 2013-04-27: qty 250

## 2013-04-27 MED ORDER — GUAIFENESIN ER 600 MG PO TB12
600.0000 mg | ORAL_TABLET | Freq: Two times a day (BID) | ORAL | Status: DC
  Administered 2013-04-27 – 2013-04-30 (×6): 600 mg via ORAL
  Filled 2013-04-27 (×7): qty 1

## 2013-04-27 MED ORDER — MORPHINE SULFATE 2 MG/ML IJ SOLN
2.0000 mg | INTRAMUSCULAR | Status: DC | PRN
  Administered 2013-04-27 – 2013-04-29 (×7): 2 mg via INTRAVENOUS
  Filled 2013-04-27 (×7): qty 1

## 2013-04-27 MED ORDER — LEVALBUTEROL HCL 0.63 MG/3ML IN NEBU
0.6300 mg | INHALATION_SOLUTION | RESPIRATORY_TRACT | Status: DC | PRN
  Administered 2013-04-28: 0.63 mg via RESPIRATORY_TRACT
  Filled 2013-04-27: qty 3

## 2013-04-27 MED ORDER — ALBUTEROL SULFATE (5 MG/ML) 0.5% IN NEBU: 2.5000 mg | INHALATION_SOLUTION | Freq: Four times a day (QID) | RESPIRATORY_TRACT | Status: DC

## 2013-04-27 MED ORDER — IPRATROPIUM BROMIDE 0.02 % IN SOLN
0.5000 mg | Freq: Four times a day (QID) | RESPIRATORY_TRACT | Status: DC
  Administered 2013-04-27 – 2013-04-30 (×10): 0.5 mg via RESPIRATORY_TRACT
  Filled 2013-04-27 (×12): qty 2.5

## 2013-04-27 MED ORDER — PREDNISONE 20 MG PO TABS
40.0000 mg | ORAL_TABLET | Freq: Two times a day (BID) | ORAL | Status: DC
  Administered 2013-04-28: 40 mg via ORAL
  Filled 2013-04-27 (×3): qty 2

## 2013-04-27 MED ORDER — PANTOPRAZOLE SODIUM 40 MG PO TBEC
40.0000 mg | DELAYED_RELEASE_TABLET | Freq: Every day | ORAL | Status: DC
  Administered 2013-04-28 – 2013-04-29 (×2): 40 mg via ORAL
  Filled 2013-04-27 (×2): qty 1

## 2013-04-27 MED ORDER — LEVALBUTEROL HCL 0.63 MG/3ML IN NEBU
0.6300 mg | INHALATION_SOLUTION | Freq: Four times a day (QID) | RESPIRATORY_TRACT | Status: DC
  Administered 2013-04-27 – 2013-04-30 (×10): 0.63 mg via RESPIRATORY_TRACT
  Filled 2013-04-27 (×14): qty 3

## 2013-04-27 NOTE — Progress Notes (Signed)
[facility] Progress Note [place] TEAM 1 - Stepdown/ICU TEAM   Stephanie Schultz ZOX:[id number] DOB: 08/20/1956 DOA: 04/26/2013 PCP: Lonia Blood, MD  Brief narrative: 57 y.o. female with past medical history most significant for hypertension, diabetes, anxiety, COPD on home oxygen, obstructive sleep apnea, reflux disease, morbid obesity and ongoing tobacco use comes in with chief complaint of progressive shortness of breath . Patient was recently discharged from the hospital on June 4th. Patient said that she has been taking all her medications as prescribed including all her inhalers but her shortness of breath progressively got worse over last one and a half week. Shortness of breath was associated with progressive productive cough with yellow sputum and also increased use of her inhalers. Patient denied any fever or chills.   Patient's chest x-ray and initial labs were unremarkable for any acute lung infection. Patient's shortness of breath was unrelieved with multiple nebulizer treatments in the ER. At the time of visit patient started having 8/10 chest pain, pressure like sensation, present in the center of the chest, radiating to left arm, no exacerbating or relieving factors, unrelated with deep breathing. Patient was given morphine in the ER which relieved the pain somewhat.    Assessment/Plan:  Acute on chronic respiratory failure due to:   ? Chronic diastolic heart failure/ LVH   COPD exacerbation   Obstructive sleep apnea -still wheezing with wet sounding cough -no focal infiltrates seen on CXR -cont supportive care - taper steroids (was on Pred-pak at home with doxycycline) -no evidence of CHF -?compliance with CPAP at home  Diabetes mellitus type 2, controlled -last HgbA1c 6.9 -follow CBG while on steroids -was not on meds pre admit  Hypertension -moderately controlled -was on diuretic at home- hold now since URI with relative dehydration -decrease IVF to 50/hr    Leukocytosis -due to steroids  GERD (gastroesophageal reflux disease) -cont PPI  Tobacco abuse -counseled re: cessation   Chest pain -d dimer negative (6/16) -troponin negative x3 -resolved at time of visit today  Sickle cell trait  Morbid obesity  DVT prophylaxis: Heparin SQ Code Status: Full Family Communication: Patient Disposition Plan: Stepdown Isolation: None  Consultants: None  Procedures: None  Antibiotics: Levaquin 6/15 >>>  HPI/Subjective: Patient awake and endorses issues with persistent shortness of breath and cough but states does feel better than at time of admission.  Denies current chest pain.   Objective: Blood pressure 160/86, pulse 111, temperature 98.4 F (36.9 C), temperature source Oral, resp. rate 17, height 5' (1.524 m), weight 100 kg (220 lb 7.4 oz), last menstrual period 06/04/2000, SpO2 100.00%.  Intake/Output Summary (Last 24 hours) at 04/27/13 1346 Last data filed at 04/27/13 1342  Gross per 24 hour  Intake 1404.5 ml  Output    875 ml  Net  529.5 ml   Exam: General: No acute respiratory distress Lungs: diffuse exp wheeze - no focal crackles  Cardiovascular: Regular rate and rhythm with a mild persistent tachycardia; but without murmur gallop or rub normal S1 and S2, no peripheral edema or JVD Abdomen: Nontender, nondistended, soft, bowel sounds positive, no rebound, no ascites, no appreciable mass Musculoskeletal: No significant cyanosis, clubbing of bilateral lower extremities Neurological: Alert and oriented x 3, moves all extremities x 4 without focal neurological deficits, CN 2-12 intact  Scheduled Meds: Scheduled Meds: . albuterol  2.5 mg Nebulization Q4H  . aspirin EC  81 mg Oral Daily  . atorvastatin  80 mg Oral q1800  . ipratropium  0.5 mg Nebulization Q4H  .  levofloxacin (LEVAQUIN) IV  750 mg Intravenous QHS  . methylPREDNISolone (SOLU-MEDROL) injection  125 mg Intravenous Q12H  . pantoprazole (PROTONIX) IV  40 mg  Intravenous QHS    Data Reviewed: Basic Metabolic Panel:  Recent Labs Lab 04/26/13 1748  NA 140  K 4.1  CL 100  CO2 30  GLUCOSE 121*  BUN 17  CREATININE 0.81  CALCIUM 9.8   Liver Function Tests:  Recent Labs Lab 04/26/13 1748  AST 138*  ALT 250*  ALKPHOS 64  BILITOT 0.2*  PROT 7.2  ALBUMIN 3.4*   CBC:  Recent Labs Lab 04/26/13 1748 04/27/13 0725  WBC 10.9* 17.5*  NEUTROABS 7.8*  --   HGB 13.3 12.3  HCT 37.9 34.8*  MCV 83.7 83.1  PLT 197 188   Cardiac Enzymes:  Recent Labs Lab 04/26/13 1748 04/26/13 2333 04/27/13 0725  TROPONINI <0.30 <0.30 <0.30   BNP (last 3 results)  Recent Labs  01/17/13 1410  PROBNP 103.2     Recent Results (from the past 240 hour(s))  MRSA PCR SCREENING     Status: None   Collection Time    04/27/13 12:56 AM      Result Value Range Status   MRSA by PCR NEGATIVE  NEGATIVE Final   Comment:            The GeneXpert MRSA Assay (FDA     approved for NASAL specimens     only), is one component of a     comprehensive MRSA colonization     surveillance program. It is not     intended to diagnose MRSA     infection nor to guide or     monitor treatment for     MRSA infections.     Studies:  Recent x-ray studies have been reviewed in detail by the Attending Physician   Junious Silk, ANP [facility] Office  [phone] Pager [phone]  **If unable to reach the above provider after paging please contact the Flow Manager @ [phone]  On-Call/Text Page:      [email or web address]      password [email or web address]  If 7PM-7AM, please contact night-coverage [email or web address] Password [email or web address] 04/27/2013, 1:46 PM   LOS: 1 day   I have personally examined this patient and reviewed the entire database. I have reviewed the above note, made any necessary editorial changes, and agree with its content.  Lonia Blood, MD [facility]

## 2013-04-27 NOTE — Progress Notes (Signed)
ANTICOAGULATION CONSULT NOTE - Follow-up  Pharmacy Consult for Heparin Indication: chest pain/ACS  No Known Allergies  Patient Measurements: Height: 5' (152.4 cm) Weight: 220 lb 7.4 oz (100 kg) IBW/kg (Calculated) : 45.5   Heparin Dosing Weight: 65 kg  Vital Signs: Temp: 98.4 F (36.9 C) (06/16 1138) Temp src: Oral (06/16 1138) BP: 160/86 mmHg (06/16 1138) Pulse Rate: 111 (06/16 1138)  Labs:  Recent Labs  04/26/13 1748 04/26/13 2333 04/27/13 0725 04/27/13 1511  HGB 13.3  --  12.3  --   HCT 37.9  --  34.8*  --   PLT 197  --  188  --   HEPARINUNFRC  --   --  <0.10* 0.13*  CREATININE 0.81  --   --   --   TROPONINI <0.30 <0.30 <0.30  --     Estimated Creatinine Clearance: 84.4 ml/min (by C-G formula based on Cr of 0.81).  Assessment: 57 y.o. female presented with SOB, chest pain. Recently d/c from hospital 6/4 for COPD exacerbation. She continues on IV heparin and the heparin level is still low at 0.13 on 1100 units/hr.  Goal of Therapy:  Heparin level 0.3-0.7 units/ml Monitor platelets by anticoagulation protocol: Yes   Plan:  1. Heparin bolus 2000 units IV x 1 2. Increase heparin gtt to 1250 units/hr 3. Check a 6 hour heparin level  Harland German, [place] D 04/27/2013 5:21 PM

## 2013-04-27 NOTE — Progress Notes (Signed)
ANTICOAGULATION CONSULT NOTE - Follow-up  Pharmacy Consult for Heparin Indication: chest pain/ACS  No Known Allergies  Patient Measurements: Height: 5' (152.4 cm) Weight: 220 lb 7.4 oz (100 kg) IBW/kg (Calculated) : 45.5   Heparin Dosing Weight: 65 kg  Vital Signs: Temp: 98.5 F (36.9 C) (06/16 0735) Temp src: Oral (06/16 0735) BP: 144/76 mmHg (06/16 0735) Pulse Rate: 112 (06/16 0735)  Labs:  Recent Labs  04/26/13 1748 04/26/13 2333 04/27/13 0725  HGB 13.3  --  12.3  HCT 37.9  --  34.8*  PLT 197  --  188  HEPARINUNFRC  --   --  <0.10*  CREATININE 0.81  --   --   TROPONINI <0.30 <0.30 <0.30    Estimated Creatinine Clearance: 84.4 ml/min (by C-G formula based on Cr of 0.81).  Assessment: 57 y.o. female presented with SOB, chest pain. Recently d/c from hospital 6/4 for COPD exacerbation. She continues on IV heparin. Her initial heparin level is undetectable. CBC is stable, no bleeding noted.   Goal of Therapy:  Heparin level 0.3-0.7 units/ml Monitor platelets by anticoagulation protocol: Yes   Plan:  1. Heparin bolus 2000 units IV x 1 2. Increase heparin gtt to 1100 units/hr 3. Check a 6 hour heparin level  Lysle Pearl, PharmD, BCPS Pager # [phone] 04/27/2013 8:55 AM

## 2013-04-27 NOTE — Progress Notes (Signed)
Lenny Pastel NP made aware of MSCP radiating to back unrelieived by NTG SL . EKG obtained

## 2013-04-27 NOTE — Progress Notes (Signed)
[facility] progress note Chief complaint. Chest pain. History of present illness. This 57 year old female presented to [facility] with complaints of shortness of breath and chest pain. She was admitted for further evaluation. Her initial troponins and EKG were negative. Patient now complains of chest pain to nursing. EKG was obtained and reviewed. The EKG does not look significantly changed from the one done previously in the emergency room and does not look suggestive of ischemia. I came to see the patient at bedside. Post administration of morphine the patient states her chest pain had resolved. She describes the pain starting above the left breast and radiating to the left arm. The pain also radiated to her back. She describes pain 7/10 at its worst and currently she is pain-free. She states that the pain did produce some diaphoresis. Vital signs. Temperature 98.4, pulse 125, respiration 29, blood pressure 142/59. O2 sats 93%. General appearance. Obese middle-aged female who is alert, cooperative, and in no distress. Cardiac. Rate mildly tachycardic and rhythm regular. Lungs. Clear but reduced in the bases. Mild expiratory wheezing. Abdomen. Soft and obese with positive bowel sounds. No pain with palpation. Impression/plan. Problem #1. Chest pain. Current EKG looks unchanged and does not look suggestive of ischemia. Patient states that palpation over the left chest does cause pain similar to what she is experiencing. 2 of 3 troponins have now resulted and thus far are negative. He further EKG is scheduled for later this a.m. Also 1 further troponin is pending. We'll follow for these results. Patient currently chest pain-free. Patient continues on heparin drip and aspirin.

## 2013-04-28 DIAGNOSIS — R079 Chest pain, unspecified: Secondary | ICD-10-CM

## 2013-04-28 DIAGNOSIS — E119 Type 2 diabetes mellitus without complications: Secondary | ICD-10-CM

## 2013-04-28 LAB — BASIC METABOLIC PANEL
BUN: 28 mg/dL — ABNORMAL HIGH (ref 6–23)
GFR calc Af Amer: 81 mL/min — ABNORMAL LOW (ref >90)
GFR calc non Af Amer: 70 mL/min — ABNORMAL LOW (ref >90)
Potassium: 4.5 mEq/L (ref 3.5–5.1)

## 2013-04-28 LAB — CBC
HCT: 34.4 % — ABNORMAL LOW (ref 36.0–46.0)
MCHC: 34.3 g/dL (ref 30.0–36.0)
Platelets: 199 10*3/uL (ref 150–400)
RDW: 15.4 % (ref 11.5–15.5)
WBC: 21.2 10*3/uL — ABNORMAL HIGH (ref 4.0–10.5)

## 2013-04-28 MED ORDER — KETOROLAC TROMETHAMINE 15 MG/ML IJ SOLN
15.0000 mg | Freq: Once | INTRAMUSCULAR | Status: AC
  Administered 2013-04-28: 15 mg via INTRAVENOUS
  Filled 2013-04-28: qty 1

## 2013-04-28 MED ORDER — PREDNISONE 20 MG PO TABS: 20.0000 mg | ORAL_TABLET | Freq: Two times a day (BID) | ORAL | Status: DC

## 2013-04-28 MED ORDER — PREDNISONE 20 MG PO TABS
20.0000 mg | ORAL_TABLET | Freq: Two times a day (BID) | ORAL | Status: DC
  Administered 2013-04-29 – 2013-04-30 (×3): 20 mg via ORAL
  Filled 2013-04-28 (×5): qty 1

## 2013-04-28 NOTE — Progress Notes (Signed)
[facility] Progress Note [place] TEAM 1 - Stepdown/ICU TEAM   Arieonna Medine WRU:[id number] DOB: Oct 06, 1956 DOA: 04/26/2013 PCP: Lonia Blood, MD  Brief narrative: 57 y.o. female with past medical history most significant for hypertension, diabetes, anxiety, COPD on home oxygen, obstructive sleep apnea, reflux disease, morbid obesity and ongoing tobacco use comes in with chief complaint of progressive shortness of breath . Patient was recently discharged from the hospital on June 4th. Patient said that she has been taking all her medications as prescribed including all her inhalers but her shortness of breath progressively got worse over last one and a half week. Shortness of breath was associated with progressive productive cough with yellow sputum and also increased use of her inhalers. Patient denied any fever or chills.   Patient's chest x-ray and initial labs were unremarkable for any acute lung infection. Patient's shortness of breath was unrelieved with multiple nebulizer treatments in the ER. At the time of visit patient started having 8/10 chest pain, pressure like sensation, present in the center of the chest, radiating to left arm, no exacerbating or relieving factors, unrelated with deep breathing. Patient was given morphine in the ER which relieved the pain somewhat.    Assessment/Plan:  Acute on chronic respiratory failure due to:   ? Chronic diastolic heart failure/ LVH   COPD exacerbation   Obstructive sleep apnea -still wheezing but only minimally in lungs-primarily upper airway and associated with hoarseness -no focal infiltrates seen on CXR -cont supportive care - taper steroids (was on Pred-pak at home with doxycycline) -no evidence of CHF -?compliance with CPAP at home -mobilize  Diabetes mellitus type 2, controlled -last HgbA1c 6.9 -follow CBG while on steroids -was not on meds pre admit  Hypertension -moderately controlled -was on diuretic at home- hold  now since URI with relative dehydration -continue IVF at 50/hr   Leukocytosis -due to steroids  GERD (gastroesophageal reflux disease) -cont PPI  Tobacco abuse -counseled re: cessation   Chest pain -d dimer negative (6/16) -troponin negative x3 -recurrent nocturnally with features of costochrondritis  Sickle cell trait  Morbid obesity  DVT prophylaxis: Heparin SQ Code Status: Full Family Communication: Patient Disposition Plan: Transfer to floor Isolation: None  Consultants: None  Procedures: None  Antibiotics: Levaquin 6/15 >>>  HPI/Subjective: Patient still mildly SOB and hoarse-sts has not been OOB yet  Objective: Blood pressure 141/71, pulse 107, temperature 97.4 F (36.3 C), temperature source Axillary, resp. rate 23, height 5' (1.524 m), weight 100 kg (220 lb 7.4 oz), last menstrual period 06/04/2000, SpO2 98.00%.  Intake/Output Summary (Last 24 hours) at 04/28/13 1141 Last data filed at 04/28/13 1100  Gross per 24 hour  Intake   2980 ml  Output   1250 ml  Net   1730 ml   Exam: General: No acute respiratory distress Lungs: Focal wheeze left mid lung- upper airway wheezing with hoarseness - no focal crackles , 2L Cardiovascular: Regular rate and rhythm with a mild persistent tachycardia; but without murmur gallop or rub normal S1 and S2, no peripheral edema or JVD Abdomen: Nontender, nondistended, soft, bowel sounds positive, no rebound, no ascites, no appreciable mass Musculoskeletal: No significant cyanosis, clubbing of bilateral lower extremities Neurological: Alert and oriented x 3, moves all extremities x 4 without focal neurological deficits, CN 2-12 intact  Scheduled Meds: Scheduled Meds: . aspirin EC  81 mg Oral Daily  . atorvastatin  80 mg Oral q1800  . fluticasone  2 spray Each Nare BID  . guaiFENesin  600 mg Oral BID  . heparin subcutaneous  5,000 Units Subcutaneous Q8H  . ipratropium  0.5 mg Nebulization QID  . levalbuterol  0.63 mg  Nebulization QID  . levofloxacin (LEVAQUIN) IV  750 mg Intravenous QHS  . loratadine  10 mg Oral Daily  . pantoprazole  40 mg Oral Q1200  . predniSONE  40 mg Oral BID WC    Data Reviewed: Basic Metabolic Panel:  Recent Labs Lab 04/26/13 1748 04/28/13 0550  NA 140 143  K 4.1 4.5  CL 100 103  CO2 30 31  GLUCOSE 121* 149*  BUN 17 28*  CREATININE 0.81 0.91  CALCIUM 9.8 9.3   Liver Function Tests:  Recent Labs Lab 04/26/13 1748  AST 138*  ALT 250*  ALKPHOS 64  BILITOT 0.2*  PROT 7.2  ALBUMIN 3.4*   CBC:  Recent Labs Lab 04/26/13 1748 04/27/13 0725 04/28/13 0550  WBC 10.9* 17.5* 21.2*  NEUTROABS 7.8*  --   --   HGB 13.3 12.3 11.8*  HCT 37.9 34.8* 34.4*  MCV 83.7 83.1 84.1  PLT 197 188 199   Cardiac Enzymes:  Recent Labs Lab 04/26/13 1748 04/26/13 2333 04/27/13 0725  TROPONINI <0.30 <0.30 <0.30   BNP (last 3 results)  Recent Labs  01/17/13 1410  PROBNP 103.2     Recent Results (from the past 240 hour(s))  MRSA PCR SCREENING     Status: None   Collection Time    04/27/13 12:56 AM      Result Value Range Status   MRSA by PCR NEGATIVE  NEGATIVE Final   Comment:            The GeneXpert MRSA Assay (FDA     approved for NASAL specimens     only), is one component of a     comprehensive MRSA colonization     surveillance program. It is not     intended to diagnose MRSA     infection nor to guide or     monitor treatment for     MRSA infections.     Studies:  Recent x-ray studies have been reviewed in detail by the Attending Physician   Junious Silk, ANP [facility] Office  [phone] Pager [phone]  **If unable to reach the above provider after paging please contact the Flow Manager @ [phone]  On-Call/Text Page:      [email or web address]      password [email or web address]  If 7PM-7AM, please contact night-coverage [email or web address] Password [facility] 04/28/2013, 11:41 AM   LOS: 2 days    I have examined the patient, reviewed the chart and  modified the above note which I agree with.   Joyia Riehle,MD [phone] 04/28/2013, 4:07 PM

## 2013-04-29 DIAGNOSIS — F17200 Nicotine dependence, unspecified, uncomplicated: Secondary | ICD-10-CM

## 2013-04-29 DIAGNOSIS — I5032 Chronic diastolic (congestive) heart failure: Secondary | ICD-10-CM

## 2013-04-29 DIAGNOSIS — D72829 Elevated white blood cell count, unspecified: Secondary | ICD-10-CM

## 2013-04-29 DIAGNOSIS — J9620 Acute and chronic respiratory failure, unspecified whether with hypoxia or hypercapnia: Secondary | ICD-10-CM

## 2013-04-29 DIAGNOSIS — G4733 Obstructive sleep apnea (adult) (pediatric): Secondary | ICD-10-CM

## 2013-04-29 DIAGNOSIS — I10 Essential (primary) hypertension: Secondary | ICD-10-CM

## 2013-04-29 LAB — BASIC METABOLIC PANEL
Chloride: 103 mEq/L (ref 96–112)
GFR calc Af Amer: 73 mL/min — ABNORMAL LOW (ref >90)
GFR calc non Af Amer: 63 mL/min — ABNORMAL LOW (ref >90)
Glucose, Bld: 121 mg/dL — ABNORMAL HIGH (ref 70–99)
Potassium: 4.2 mEq/L (ref 3.5–5.1)
Sodium: 141 mEq/L (ref 135–145)

## 2013-04-29 LAB — CBC
Hemoglobin: 11.8 g/dL — ABNORMAL LOW (ref 12.0–15.0)
MCHC: 35.3 g/dL (ref 30.0–36.0)
RDW: 15.5 % (ref 11.5–15.5)
WBC: 14.5 10*3/uL — ABNORMAL HIGH (ref 4.0–10.5)

## 2013-04-29 MED ORDER — ACETAMINOPHEN 325 MG PO TABS
650.0000 mg | ORAL_TABLET | Freq: Four times a day (QID) | ORAL | Status: DC | PRN
  Administered 2013-04-29 – 2013-04-30 (×3): 650 mg via ORAL
  Filled 2013-04-29 (×3): qty 2

## 2013-04-29 MED ORDER — LEVOFLOXACIN 500 MG PO TABS
500.0000 mg | ORAL_TABLET | Freq: Every day | ORAL | Status: DC
  Administered 2013-04-29: 500 mg via ORAL
  Filled 2013-04-29 (×2): qty 1

## 2013-04-29 MED ORDER — TRIAMTERENE-HCTZ 37.5-25 MG PO TABS
1.0000 | ORAL_TABLET | Freq: Every day | ORAL | Status: DC
  Administered 2013-04-29 – 2013-04-30 (×2): 1 via ORAL
  Filled 2013-04-29 (×2): qty 1

## 2013-04-29 MED ORDER — ACETAMINOPHEN 325 MG PO TABS
650.0000 mg | ORAL_TABLET | Freq: Once | ORAL | Status: AC
  Administered 2013-04-29: 650 mg via ORAL
  Filled 2013-04-29: qty 2

## 2013-04-29 MED ORDER — TRIAMTERENE-HCTZ 37.5-25 MG PO CAPS
1.0000 | ORAL_CAPSULE | Freq: Every day | ORAL | Status: DC
  Filled 2013-04-29: qty 1

## 2013-04-29 MED ORDER — CLONAZEPAM 0.5 MG PO TABS
0.2500 mg | ORAL_TABLET | Freq: Two times a day (BID) | ORAL | Status: DC | PRN
Start: 2013-04-29 — End: 2013-04-30

## 2013-04-29 NOTE — Progress Notes (Signed)
Received pt.from 2600 nurse via wheelchair with 02 at 2lnc in use.Oriented pt.to the room & call bell.She is AAO X4 ;obese,skin intact ;SOB  On exertion.V/S taken.made comfortable in bed.Will cont.to monitor pt.Marland KitchenMarland KitchenMarland KitchenSonia Flem Enderle.RN

## 2013-04-29 NOTE — Consult Note (Signed)
PULMONARY  / CRITICAL CARE MEDICINE  Name: Stephanie Schultz MRN: [id number] DOB: 1956-11-05    ADMISSION DATE:  04/26/2013 CONSULTATION DATE:  04/29/13  REFERRING MD :  ghimire PRIMARY SERVICE:  [facility]  CHIEF COMPLAINT:  SOB  BRIEF PATIENT DESCRIPTION: 57 yo female with hx HTN, COPD on home O2, OSA, morbid obesity, ongoing tobacco abuse with recurrent hospitalizations r/t acute on chronic respiratory failure.  Admitted 6/15 by [facility] with SOB, cough and increased yellow sputum.  Also c/o chest pain.  Troponins and EKG negative. PCCM consulted to assist with chronic resp failure and frequent admissions.    SIGNIFICANT EVENTS / STUDIES:  none  LINES / TUBES: none  CULTURES: none  ANTIBIOTICS: Levaquin 6/15>>>  HISTORY OF PRESENT ILLNESS:  57yo female with hx HTN, COPD on home O2, morbid obesity, OSA, ongoing tobacco abuse presented 6/15 with SOB, cough, increased yellow sputum and chest pain.  Initial troponins and EKG negative.  Pt cont to c/o chest/back pain, worse with activity.  She also still c/o SOB and cough although improved since admission.    PAST MEDICAL HISTORY :  Past Medical History  Diagnosis Date  . Borderline diabetes   . Hypertension   . Anxiety   . Emphysema   . COPD (chronic obstructive pulmonary disease)   . OSA on CPAP     CPAP at night   . Dizziness     pt believes this is motion sickness or vertigo  . GERD (gastroesophageal reflux disease)   . Headache(784.0)   . Cancer 1990    cervical   . Anemia   . Morbid obesity   . Sickle cell trait   . Hx of cardiac catheterization     a. LHC at [facility] in [place], [place] 09/2008:  Normal coronary arteries EF 70%.  . Tobacco abuse     a. up to 3ppd from age 80 to 55, now 1/4 ppd (01/2013)   Past Surgical History  Procedure Laterality Date  . Tubal ligation    . Cardiac catheterization    . Colonoscopy  09/05/2012    Procedure: COLONOSCOPY;  Surgeon: Theda Belfast, MD;  Location: [facility] ENDOSCOPY;  Service:  Endoscopy;  Laterality: N/A;   Prior to Admission medications   Medication Sig Start Date End Date Taking? Authorizing Provider  albuterol (PROVENTIL HFA;VENTOLIN HFA) 108 (90 BASE) MCG/ACT inhaler Inhale 2 puffs into the lungs every 4 (four) hours as needed for wheezing. 03/11/13 03/11/14 Yes Leslye Peer, MD  albuterol (PROVENTIL) (2.5 MG/3ML) 0.083% nebulizer solution Take 3 mLs (2.5 mg total) by nebulization 4 (four) times daily. DX: 496. For wheezing 04/15/13 04/15/14 Yes Ripudeep Jenna Luo, MD  cyclobenzaprine (FLEXERIL) 10 MG tablet Take 10 mg by mouth 2 (two) times daily as needed for muscle spasms.   Yes Historical Provider, MD  diazepam (VALIUM) 10 MG tablet Take 10 mg by mouth at bedtime.   Yes Historical Provider, MD  doxycycline (VIBRA-TABS) 100 MG tablet Take 1 tablet (100 mg total) by mouth every 12 (twelve) hours. X 10days 04/15/13  Yes Ripudeep K Rai, MD  fluticasone (FLONASE) 50 MCG/ACT nasal spray Place 2 sprays into the nose 2 (two) times daily. 07/08/12 07/08/13 Yes Leslye Peer, MD  guaiFENesin (MUCINEX) 600 MG 12 hr tablet Take 1 tablet (600 mg total) by mouth 2 (two) times daily. 04/15/13  Yes Ripudeep Jenna Luo, MD  loratadine (CLARITIN) 10 MG tablet take 1 tablet by mouth once daily 11/14/12  Yes Leslye Peer, MD  miconazole (MICOTIN) 2 % cream Apply 1 application topically 2 (two) times daily.   Yes Historical Provider, MD  omeprazole (PRILOSEC) 20 MG capsule Take 20 mg by mouth 2 (two) times daily.   Yes Historical Provider, MD  potassium chloride SA (K-DUR,KLOR-CON) 20 MEQ tablet Take 20 mEq by mouth See admin instructions. Sometimes 20 MEQ takes once daily, but usually 20 MEQ twice daily.   Yes Historical Provider, MD  SPIRIVA HANDIHALER 18 MCG inhalation capsule inhale contents of 1 capsule by mouth once daily 12/15/12  Yes Leslye Peer, MD  SYMBICORT 160-4.5 MCG/ACT inhaler inhale 2 puffs by mouth twice a day 04/10/13  Yes Leslye Peer, MD  triamterene-hydrochlorothiazide (DYAZIDE)  37.5-25 MG per capsule take 1 capsule by mouth once daily 08/11/12  Yes Leslye Peer, MD  vitamin B-12 (CYANOCOBALAMIN) 500 MCG tablet Take 500 mcg by mouth daily.   Yes Historical Provider, MD  aspirin EC 81 MG tablet Take 81 mg by mouth daily.    Historical Provider, MD  predniSONE (STERAPRED UNI-PAK) 10 MG tablet Take Prednisone 40mg  (4 tabs) x 3 days, then  30mg  (3 tabs) x 3 days, then 20mg  (2 tabs) x 3days, then 10mg  (1 tab) x 3days, then OFF. 04/15/13   Ripudeep Jenna Luo, MD   No Known Allergies  FAMILY HISTORY:  Family History  Problem Relation Age of Onset  . Lung cancer Paternal Aunt   . Lung cancer Paternal Grandfather   . Other Father     unaware of father's medical history  . Diabetes Mother     alive @ 30  . Other      multiple siblings a&w.   SOCIAL HISTORY:  reports that she has been smoking Cigarettes.  She has a 10.8 pack-year smoking history. She has never used smokeless tobacco. She reports that she does not drink alcohol or use illicit drugs.  REVIEW OF SYSTEMS:   C/o SOB, 5/10 chest/back pain, does not radiate.  C/o cough, yellow sputum, sleepy.  Denies hemoptysis, leg/calf pain, headache, night sweats.  All other systems reviewed and were neg.   VITAL SIGNS: Temp:  [97.4 F (36.3 C)-98.4 F (36.9 C)] 98.3 F (36.8 C) (06/18 0602) Pulse Rate:  [98-125] 98 (06/18 0602) Resp:  [23-40] 28 (06/18 0602) BP: (126-162)/(77-108) 144/84 mmHg (06/18 0602) SpO2:  [84 %-99 %] 98 % (06/18 0813) Weight:  [227 lb 1.2 oz (103 kg)] 227 lb 1.2 oz (103 kg) (06/17 2323)  PHYSICAL EXAMINATION: General:  Obese female, NAD in bed Neuro:  Drowsy but awake, appropriate, MAE HEENT:  Mm dry, no JVD Cardiovascular:  s1s2 distant Lungs:  resps even non labored on [place], diminished throughout Abdomen:  Obese, soft, +bs Musculoskeletal:  Warm and dry, scant BLE edema    Recent Labs Lab 04/26/13 1748 04/28/13 0550 04/29/13 0616  NA 140 143 141  K 4.1 4.5 4.2  CL 100 103 103  CO2  30 31 30   BUN 17 28* 25*  CREATININE 0.81 0.91 1.00  GLUCOSE 121* 149* 121*    Recent Labs Lab 04/27/13 0725 04/28/13 0550 04/29/13 0616  HGB 12.3 11.8* 11.8*  HCT 34.8* 34.4* 33.4*  WBC 17.5* 21.2* 14.5*  PLT 188 199 191   No results found.  ASSESSMENT / PLAN:  Acute on chronic respiratory failure - multifactorial in setting AECOPD, OSA/OHS, possible chronic diastolic heart failure.  CXR without obvious infiltrate. No significant hypoxia.  PLAN -  -add qhs CPAP, auto-titration setup - pt says she  uses at home, not on here, ?complaince at home  -smoking cessation!  -keep dry as tol  -cont abx x 5 d total (last day 6/19) change to PO 6/18; steroids, BD; recommend tapering steroids to zero over about 10 days.  -keep dry as tol  -mobilize  -pulm hygiene  -needs pain control for chest/back pain but avoid oversedation (?etiology); whether she needs increas in her GERD therapy, ENT has made note of cobblestoning in her posterior pharynx on prior evals -frequent outpt f/u > follow w Aden Youngman July 11 at 4:15pm   [facility], NP 04/29/2013  9:02 AM Pager: [phone] or [phone]  *Care during the described time interval was provided by me and/or other providers on the critical care team. I have reviewed this patient's available data, including medical history, events of note, physical examination and test results as part of my evaluation.  Levy Pupa, MD, PhD 04/29/2013, 12:20 PM [facility] [phone] or if no answer [phone]

## 2013-04-29 NOTE — Progress Notes (Signed)
[facility] PROGRESS NOTE  Stephanie Schultz UJW:[id number] DOB: September 18, 1956 DOA: 04/26/2013 PCP: Lonia Blood, MD  Assessment/Plan: 1. Acute on chronic respiratory failure -multifactorial; may be due to COPD exacerbation, chronic diastolic heart failure/LVH, obstructive sleep apnea.   -still wheezing but only minimally in lungs-primarily upper airway and associated with hoarseness; hoarseness is chronic  -no focal infiltrates seen on CXR  -cont supportive care (atrovent, mucolytics, breathing treatments)- taper steroids (was on Pred-pak at home with doxycycline);   -no evidence of CHF  -?compliance with CPAP at home  -mobilize -discontinue morphine.  2. Diabetes mellitus type 2, controlled  -last HgbA1c 6.9  -follow CBG while on steroids  -was not on meds pre admit  3. Hypertension  -moderately controlled  -was on diuretic at home- resume HCTZ/triamterene today  4. Leukocytosis  -due to steroids -improving  5. GERD (gastroesophageal reflux disease)  -cont PPI   6. Tobacco abuse  -smoking cessation counseling   7. Chest pain  -d dimer negative (6/16)  -troponin negative x3  -recurrent nocturnally with features of costochrondritis   8. Sickle cell trait   9. Morbid obesity  Code Status: Full Family Communication: none Disposition Plan: Inpatient   Consultants:  Pulmonology  Procedures:  none  Antibiotics:  Levaquin started 6/15  HPI/Subjective: Patient is complaining of worsening shortness of breath and chest pain associated with coughing. She desires more pain relief.   Objective: Filed Vitals:   04/28/13 1954 04/28/13 2323 04/29/13 0602 04/29/13 0813  BP: 145/84 162/102 144/84   Pulse: 117 101 98   Temp: 98.4 F (36.9 C) 98.2 F (36.8 C) 98.3 F (36.8 C)   TempSrc: Oral Oral Oral   Resp: 23 24 28    Height:  5' (1.524 m)    Weight:  103 kg (227 lb 1.2 oz)    SpO2: 98% 97% 96% 98%    Intake/Output Summary (Last 24 hours) at 04/29/13  1135 Last data filed at 04/29/13 0904  Gross per 24 hour  Intake    970 ml  Output   1350 ml  Net   -380 ml   Filed Weights   04/27/13 0400 04/28/13 0406 04/28/13 2323  Weight: 100 kg (220 lb 7.4 oz) 100 kg (220 lb 7.4 oz) 103 kg (227 lb 1.2 oz)    Exam:   General:  Alert and oriented morbidly obese female, rapidly breathing but in no acute respiratory distress.   Cardiovascular: Regular rate and rhythm with persistent borderline tachycardia; but without murmur gallop or rub normal S1 and S2, no peripheral edema or JVD  Respiratory: mild wheezing in lower lung fields bilaterally. Diminished breath sounds throughout. On 2L oxygen. Fast respiratory rate, no retractions or increased work of breathing.   Abdomen: +BS, soft, non-tender, no masses.   Musculoskeletal: minimal bilateral lower extremity edema.    Data Reviewed: Basic Metabolic Panel:  Recent Labs Lab 04/26/13 1748 04/28/13 0550 04/29/13 0616  NA 140 143 141  K 4.1 4.5 4.2  CL 100 103 103  CO2 30 31 30   GLUCOSE 121* 149* 121*  BUN 17 28* 25*  CREATININE 0.81 0.91 1.00  CALCIUM 9.8 9.3 8.8   Liver Function Tests:  Recent Labs Lab 04/26/13 1748  AST 138*  ALT 250*  ALKPHOS 64  BILITOT 0.2*  PROT 7.2  ALBUMIN 3.4*   No results found for this basename: LIPASE, AMYLASE,  in the last 168 hours No results found for this basename: AMMONIA,  in the last 168 hours CBC:  Recent  Labs Lab 04/26/13 1748 04/27/13 0725 04/28/13 0550 04/29/13 0616  WBC 10.9* 17.5* 21.2* 14.5*  NEUTROABS 7.8*  --   --   --   HGB 13.3 12.3 11.8* 11.8*  HCT 37.9 34.8* 34.4* 33.4*  MCV 83.7 83.1 84.1 83.7  PLT 197 188 199 191   Cardiac Enzymes:  Recent Labs Lab 04/26/13 1748 04/26/13 2333 04/27/13 0725  TROPONINI <0.30 <0.30 <0.30   BNP (last 3 results)  Recent Labs  01/17/13 1410  PROBNP 103.2   CBG: No results found for this basename: GLUCAP,  in the last 168 hours  Recent Results (from the past 240  hour(s))  MRSA PCR SCREENING     Status: None   Collection Time    04/27/13 12:56 AM      Result Value Range Status   MRSA by PCR NEGATIVE  NEGATIVE Final   Comment:            The GeneXpert MRSA Assay (FDA     approved for NASAL specimens     only), is one component of a     comprehensive MRSA colonization     surveillance program. It is not     intended to diagnose MRSA     infection nor to guide or     monitor treatment for     MRSA infections.     Studies: No results found.  Scheduled Meds: . aspirin EC  81 mg Oral Daily  . atorvastatin  80 mg Oral q1800  . fluticasone  2 spray Each Nare BID  . guaiFENesin  600 mg Oral BID  . heparin subcutaneous  5,000 Units Subcutaneous Q8H  . ipratropium  0.5 mg Nebulization QID  . levalbuterol  0.63 mg Nebulization QID  . levofloxacin (LEVAQUIN) IV  750 mg Intravenous QHS  . loratadine  10 mg Oral Daily  . pantoprazole  40 mg Oral Q1200  . predniSONE  20 mg Oral BID WC   Continuous Infusions:   Active Problems:   Diabetes mellitus type 2, controlled   Hypertension   Obstructive sleep apnea   GERD (gastroesophageal reflux disease)   Acute and chronic respiratory failure   ? Chronic diastolic heart failure/ LVH   Tobacco abuse   Chest pain   Sickle cell trait   Morbid obesity   COPD exacerbation   Leukocytosis    Time spent: 35 min    Maris Berger PA-S  [facility] Pager [phone] If 7PM-7AM, please contact night-coverage at [email or web address], password [facility] 04/29/2013, 11:35 AM  LOS: 3 days   Attending Patient seen and examined, the with the above assessment and plan. She has had recurrent admissions, she has multifactorial dyspnea with elements from COPD, obstructive sleep apnea, anxiety all playing  a role. We'll get pulmonary evaluation today, suspect she is not very far from her usual baseline, she likely will be discharged home tomorrow.  S Ghimire

## 2013-04-30 MED ORDER — LEVOFLOXACIN 500 MG PO TABS: 500.0000 mg | ORAL_TABLET | Freq: Every day | ORAL | Status: DC

## 2013-04-30 MED ORDER — MORPHINE SULFATE 2 MG/ML IJ SOLN: 1.0000 mg | INTRAMUSCULAR | Status: DC | PRN

## 2013-04-30 MED ORDER — PREDNISONE (PAK) 10 MG PO TABS: ORAL_TABLET | ORAL | Status: DC

## 2013-04-30 MED ORDER — IPRATROPIUM BROMIDE 0.02 % IN SOLN: 0.5000 mg | Freq: Four times a day (QID) | RESPIRATORY_TRACT | Status: DC

## 2013-04-30 NOTE — Care Management Note (Addendum)
    Page 1 of 2   04/30/2013     11:21:15 AM   CARE MANAGEMENT NOTE 04/30/2013  Patient:  Stephanie Schultz,Stephanie Schultz   Account Number:  [id number]  Date Initiated:  04/30/2013  Documentation initiated by:  Letha Cape  Subjective/Objective Assessment:   dx dm, copd  admit- lives alone.  Patient has oxygen at home.     Action/Plan:   pt eval- no pt f/u needed.   Anticipated DC Date:  04/30/2013   Anticipated DC Plan:  HOME W HOME HEALTH SERVICES      DC Planning Services  CM consult      [facility] Choice  HOME HEALTH   Choice offered to / List presented to:  C-1 Patient        HH arranged  HH-1 RN  HH-10 DISEASE MANAGEMENT      HH agency  [facility]   Status of service:  Completed, signed off Medicare Important Message given?   (If response is "NO", the following Medicare IM given date fields will be blank) Date Medicare IM given:   Date Additional Medicare IM given:    Discharge Disposition:  HOME W HOME HEALTH SERVICES  Per UR Regulation:  Reviewed for med. necessity/level of care/duration of stay  If discussed at Long Length of Stay Meetings, dates discussed:    Comments:  04/30/13 11:11 Letha Cape RN, BSN [phone] patient lives alone, pta indep, patient has oxygen at home, patient would like to have [facility] for Copd program, she chose [facility], referral made to [facility] for [facility].  Soc will begin 24-48 hrs poste discharge. Per phsycial therapy no pt f/u needed. Per Tim at [facility] patient will need to follow up with Medicaid Case Worker,  called [facility] to see if she has a Counsellor.

## 2013-04-30 NOTE — Progress Notes (Signed)
Pt was placed on Auto CPAP with a nasal mask. 3 LPM oxygen bleed in. Pt vitals are stable and pt is comfortable.

## 2013-04-30 NOTE — Progress Notes (Signed)
NURSING PROGRESS NOTE  Stephanie Schultz [id number] Discharge Data: 04/30/2013 11:48 AM Attending Provider: Maretta Bees, MD VOZ:DGUYQ,IHKVQ, MD     [name] to be D/C'd Home per MD order.    All IV's discontinued with no bleeding noted.  All belongings returned to patient for patient to take home.   Last Vital Signs:  Blood pressure 160/92, pulse 98, temperature 98 F (36.7 C), temperature source Oral, resp. rate 24, height 5' (1.524 m), weight 103 kg (227 lb 1.2 oz), last menstrual period 06/04/2000, SpO2 96.00%.  Discharge Medication List   Medication List    STOP taking these medications       doxycycline 100 MG tablet  Commonly known as:  VIBRA-TABS      TAKE these medications       albuterol 108 (90 BASE) MCG/ACT inhaler  Commonly known as:  PROVENTIL HFA;VENTOLIN HFA  Inhale 2 puffs into the lungs every 4 (four) hours as needed for wheezing.     albuterol (2.5 MG/3ML) 0.083% nebulizer solution  Commonly known as:  PROVENTIL  Take 3 mLs (2.5 mg total) by nebulization 4 (four) times daily. DX: 496.  For wheezing     aspirin EC 81 MG tablet  Take 81 mg by mouth daily.     cyclobenzaprine 10 MG tablet  Commonly known as:  FLEXERIL  Take 10 mg by mouth 2 (two) times daily as needed for muscle spasms.     diazepam 10 MG tablet  Commonly known as:  VALIUM  Take 10 mg by mouth at bedtime.     fluticasone 50 MCG/ACT nasal spray  Commonly known as:  FLONASE  Place 2 sprays into the nose 2 (two) times daily.     guaiFENesin 600 MG 12 hr tablet  Commonly known as:  MUCINEX  Take 1 tablet (600 mg total) by mouth 2 (two) times daily.     ipratropium 0.02 % nebulizer solution  Commonly known as:  ATROVENT  Take 2.5 mLs (0.5 mg total) by nebulization 4 (four) times daily.     levofloxacin 500 MG tablet  Commonly known as:  LEVAQUIN  Take 1 tablet (500 mg total) by mouth at bedtime.     loratadine 10 MG tablet  Commonly known as:  CLARITIN  take 1 tablet  by mouth once daily     miconazole 2 % cream  Commonly known as:  MICOTIN  Apply 1 application topically 2 (two) times daily.     omeprazole 20 MG capsule  Commonly known as:  PRILOSEC  Take 20 mg by mouth 2 (two) times daily.     potassium chloride SA 20 MEQ tablet  Commonly known as:  K-DUR,KLOR-CON  Take 20 mEq by mouth See admin instructions. Sometimes 20 MEQ takes once daily, but usually 20 MEQ twice daily.     predniSONE 10 MG tablet  Commonly known as:  STERAPRED UNI-PAK  Take Prednisone 40mg  (4 tabs) x 3 days, then  30mg  (3 tabs) x 3 days, then 20mg  (2 tabs) x 3days, then 10mg  (1 tab) x 3days, then OFF.     SPIRIVA HANDIHALER 18 MCG inhalation capsule  Generic drug:  tiotropium  inhale contents of 1 capsule by mouth once daily     SYMBICORT 160-4.5 MCG/ACT inhaler  Generic drug:  budesonide-formoterol  inhale 2 puffs by mouth twice a day     triamterene-hydrochlorothiazide 37.5-25 MG per capsule  Commonly known as:  DYAZIDE  take 1 capsule by mouth once daily  vitamin B-12 500 MCG tablet  Commonly known as:  CYANOCOBALAMIN  Take 500 mcg by mouth daily.        Madelin Rear, MSN, RN, [employer]

## 2013-04-30 NOTE — Evaluation (Signed)
Physical Therapy Evaluation Patient Details Name: Stephanie Schultz MRN: [id number] DOB: Sep 11, 1956 Today's Date: 04/30/2013 Time: 1478-2956 PT Time Calculation (min): 11 min  PT Assessment / Plan / Recommendation Clinical Impression  Pt modified independent with mobility.  Pt is just limited by her SOB as she always is at baseline. No further PT needed.    PT Assessment  Patent does not need any further PT services    Follow Up Recommendations  No PT follow up    Does the patient have the potential to tolerate intense rehabilitation      Barriers to Discharge        Equipment Recommendations  None recommended by PT    Recommendations for Other Services     Frequency      Precautions / Restrictions Precautions Precautions: None Restrictions Weight Bearing Restrictions: No   Pertinent Vitals/Pain SaO2 96% after amb on 4L of O2.      Mobility  Bed Mobility Bed Mobility: Supine to Sit;Sitting - Scoot to Edge of Bed;Sit to Supine Supine to Sit: 6: Modified independent (Device/Increase time);HOB elevated Sitting - Scoot to Edge of Bed: 7: Independent Sit to Supine: 6: Modified independent (Device/Increase time);HOB elevated Transfers Transfers: Sit to Stand;Stand to Sit Sit to Stand: 7: Independent;With upper extremity assist;From bed Stand to Sit: 7: Independent;With upper extremity assist;To bed Ambulation/Gait Ambulation/Gait Assistance: 6: Modified independent (Device/Increase time) Ambulation Distance (Feet): 100 Feet Assistive device: Other (Comment) (Occasionally stopping at wall rail to catch breath) Ambulation/Gait Assistance Details: Pt require 3-4 standing rest breaks.  Pt able to push portable O2 tank. Gait Pattern: Step-through pattern;Decreased stride length Gait velocity: decr    Exercises     PT Diagnosis:    PT Problem List:   PT Treatment Interventions:     PT Goals    Visit Information  Last PT Received On: 04/30/13 Assistance Needed: +1     Subjective Data  Subjective: "Do I still get to go home?" Patient Stated Goal: Go home   Prior Functioning  Home Living Lives With: Alone Available Help at Discharge: Personal care attendant Type of Home: House Home Access: Stairs to enter Secretary/administrator of Steps: 2 Home Layout: One level Additional Comments: O2 Prior Function Comments: Amb without assistive device Communication Communication: No difficulties    Cognition  Cognition Arousal/Alertness: Awake/alert Behavior During Therapy: WFL for tasks assessed/performed Overall Cognitive Status: Within Functional Limits for tasks assessed    Extremity/Trunk Assessment Right Upper Extremity Assessment RUE ROM/Strength/Tone: [facility] for tasks assessed Left Upper Extremity Assessment LUE ROM/Strength/Tone: WFL for tasks assessed Right Lower Extremity Assessment RLE ROM/Strength/Tone: [facility] for tasks assessed Left Lower Extremity Assessment LLE ROM/Strength/Tone: WFL for tasks assessed   Balance Balance Balance Assessed: Yes Static Standing Balance Static Standing - Balance Support: No upper extremity supported;During functional activity Static Standing - Level of Assistance: 7: Independent  End of Session PT - End of Session Equipment Utilized During Treatment: Oxygen Activity Tolerance: Other (comment) (Limited by baseline SOB) Patient left: in bed;with call bell/phone within reach Nurse Communication: Mobility status  GP     [facility] 04/30/2013, 11:08 AM  Skip Mayer PT [phone]

## 2013-04-30 NOTE — Discharge Summary (Signed)
PATIENT DETAILS Name: Stephanie Schultz Age: 57 y.o. Sex: female Date of Birth: 24-Nov-1955 MRN: [id number]. Admit Date: 04/26/2013 Admitting Physician: Lars Mage, MD WUJ:WJXBJ,YNWGN, MD  Recommendations for Outpatient Follow-up:  1. Optimize COPD medications. 2. Avoid narcotics and benzodiazepines it can cause sedation 3. May need initiation of oral hypoglycemic agents.  PRIMARY DISCHARGE DIAGNOSIS:  Active Problems:   Diabetes mellitus type 2, controlled   Hypertension   Obstructive sleep apnea   GERD (gastroesophageal reflux disease)   Acute and chronic respiratory failure   ? Chronic diastolic heart failure/ LVH   Tobacco abuse   Chest pain   Sickle cell trait   Morbid obesity   COPD exacerbation   Leukocytosis      PAST MEDICAL HISTORY: Past Medical History  Diagnosis Date  . Borderline diabetes   . Hypertension   . Anxiety   . Emphysema   . COPD (chronic obstructive pulmonary disease)   . OSA on CPAP     CPAP at night   . Dizziness     pt believes this is motion sickness or vertigo  . GERD (gastroesophageal reflux disease)   . Headache(784.0)   . Cancer 1990    cervical   . Anemia   . Morbid obesity   . Sickle cell trait   . Hx of cardiac catheterization     a. LHC at [facility] in [place], [place] 09/2008:  Normal coronary arteries EF 70%.  . Tobacco abuse     a. up to 3ppd from age 89 to 58, now 1/4 ppd (01/2013)    DISCHARGE MEDICATIONS:   Medication List    STOP taking these medications       doxycycline 100 MG tablet  Commonly known as:  VIBRA-TABS      TAKE these medications       albuterol 108 (90 BASE) MCG/ACT inhaler  Commonly known as:  PROVENTIL HFA;VENTOLIN HFA  Inhale 2 puffs into the lungs every 4 (four) hours as needed for wheezing.     albuterol (2.5 MG/3ML) 0.083% nebulizer solution  Commonly known as:  PROVENTIL  Take 3 mLs (2.5 mg total) by nebulization 4 (four) times daily. DX: 496.  For wheezing     aspirin EC 81 MG tablet  Take  81 mg by mouth daily.     cyclobenzaprine 10 MG tablet  Commonly known as:  FLEXERIL  Take 10 mg by mouth 2 (two) times daily as needed for muscle spasms.     diazepam 10 MG tablet  Commonly known as:  VALIUM  Take 10 mg by mouth at bedtime.     fluticasone 50 MCG/ACT nasal spray  Commonly known as:  FLONASE  Place 2 sprays into the nose 2 (two) times daily.     guaiFENesin 600 MG 12 hr tablet  Commonly known as:  MUCINEX  Take 1 tablet (600 mg total) by mouth 2 (two) times daily.     ipratropium 0.02 % nebulizer solution  Commonly known as:  ATROVENT  Take 2.5 mLs (0.5 mg total) by nebulization 4 (four) times daily.     levofloxacin 500 MG tablet  Commonly known as:  LEVAQUIN  Take 1 tablet (500 mg total) by mouth at bedtime.     loratadine 10 MG tablet  Commonly known as:  CLARITIN  take 1 tablet by mouth once daily     miconazole 2 % cream  Commonly known as:  MICOTIN  Apply 1 application topically 2 (two) times daily.  omeprazole 20 MG capsule  Commonly known as:  PRILOSEC  Take 20 mg by mouth 2 (two) times daily.     potassium chloride SA 20 MEQ tablet  Commonly known as:  K-DUR,KLOR-CON  Take 20 mEq by mouth See admin instructions. Sometimes 20 MEQ takes once daily, but usually 20 MEQ twice daily.     predniSONE 10 MG tablet  Commonly known as:  STERAPRED UNI-PAK  Take Prednisone 40mg  (4 tabs) x 3 days, then  30mg  (3 tabs) x 3 days, then 20mg  (2 tabs) x 3days, then 10mg  (1 tab) x 3days, then OFF.     SPIRIVA HANDIHALER 18 MCG inhalation capsule  Generic drug:  tiotropium  inhale contents of 1 capsule by mouth once daily     SYMBICORT 160-4.5 MCG/ACT inhaler  Generic drug:  budesonide-formoterol  inhale 2 puffs by mouth twice a day     triamterene-hydrochlorothiazide 37.5-25 MG per capsule  Commonly known as:  DYAZIDE  take 1 capsule by mouth once daily     vitamin B-12 500 MCG tablet  Commonly known as:  CYANOCOBALAMIN  Take 500 mcg by mouth daily.         ALLERGIES:  No Known Allergies  BRIEF HPI:  See H&P, Labs, Consult and Test reports for all details in brief, patient is a 57 year old female with a past medical history of COPD on home oxygen, with recurrent numerous hospitalizations this year to be admitted for shortness of breath from presumed COPD exacerbation.   CONSULTATIONS:   pulmonary/intensive care  PERTINENT RADIOLOGIC STUDIES: X-ray Chest Pa And Lateral   04/27/2013   *RADIOLOGY REPORT*  Clinical Data: Acute shortness of breath.  CHEST - 2 VIEW  Comparison: Chest x-ray 04/26/2013.  Findings: Lung volumes are normal.  No consolidative airspace disease.  No pleural effusions.  No pneumothorax.  No pulmonary nodule or mass noted.  Pulmonary vasculature and the cardiomediastinal silhouette are within normal limits. Atherosclerosis in the thoracic aorta.  IMPRESSION: 1. No radiographic evidence of acute cardiopulmonary disease. 2.  Atherosclerosis.   Original Report Authenticated By: Trudie Reed, M.D.   Dg Chest Port 1 View  04/26/2013   *RADIOLOGY REPORT*  Clinical Data: Shortness of breath  PORTABLE CHEST - 1 VIEW  Comparison: 04/13/2013  Findings: Cardiomediastinal silhouette is stable.  No acute infiltrate or pleural effusion.  No pulmonary edema.  Stable chronic mild interstitial prominence.  IMPRESSION: No active disease.  No significant change.   Original Report Authenticated By: Natasha Mead, M.D.   Dg Chest Portable 1 View  04/13/2013   *RADIOLOGY REPORT*  Clinical Data: Shortness of breath, cough and congestion.  PORTABLE CHEST - 1 VIEW  Comparison: Single view chest 01/16/2013 and PA and lateral chest 12/05/2011.  CT chest 12/26/2011.  Findings: The lungs are emphysematous but clear.  Heart size is normal.  No pneumothorax or pleural effusion.  IMPRESSION: Emphysema without acute disease.   Original Report Authenticated By: Holley Dexter, M.D.     PERTINENT LAB RESULTS: CBC:  Recent Labs  04/28/13 0550  04/29/13 0616  WBC 21.2* 14.5*  HGB 11.8* 11.8*  HCT 34.4* 33.4*  PLT 199 191   CMET CMP     Component Value Date/Time   NA 141 04/29/2013 0616   K 4.2 04/29/2013 0616   CL 103 04/29/2013 0616   CO2 30 04/29/2013 0616   GLUCOSE 121* 04/29/2013 0616   BUN 25* 04/29/2013 0616   CREATININE 1.00 04/29/2013 0616   CALCIUM 8.8 04/29/2013 8295  PROT 7.2 04/26/2013 1748   ALBUMIN 3.4* 04/26/2013 1748   AST 138* 04/26/2013 1748   ALT 250* 04/26/2013 1748   ALKPHOS 64 04/26/2013 1748   BILITOT 0.2* 04/26/2013 1748   GFRNONAA 63* 04/29/2013 0616   GFRAA 73* 04/29/2013 0616    GFR Estimated Creatinine Clearance: 69.5 ml/min (by C-G formula based on Cr of 1). No results found for this basename: LIPASE, AMYLASE,  in the last 72 hours No results found for this basename: CKTOTAL, CKMB, CKMBINDEX, TROPONINI,  in the last 72 hours No components found with this basename: POCBNP,  No results found for this basename: DDIMER,  in the last 72 hours No results found for this basename: HGBA1C,  in the last 72 hours No results found for this basename: CHOL, HDL, LDLCALC, TRIG, CHOLHDL, LDLDIRECT,  in the last 72 hours No results found for this basename: TSH, T4TOTAL, FREET3, T3FREE, THYROIDAB,  in the last 72 hours No results found for this basename: VITAMINB12, FOLATE, FERRITIN, TIBC, IRON, RETICCTPCT,  in the last 72 hours Coags: No results found for this basename: PT, INR,  in the last 72 hours Microbiology: Recent Results (from the past 240 hour(s))  MRSA PCR SCREENING     Status: None   Collection Time    04/27/13 12:56 AM      Result Value Range Status   MRSA by PCR NEGATIVE  NEGATIVE Final   Comment:            The GeneXpert MRSA Assay (FDA     approved for NASAL specimens     only), is one component of a     comprehensive MRSA colonization     surveillance program. It is not     intended to diagnose MRSA     infection nor to guide or     monitor treatment for     MRSA infections.      BRIEF HOSPITAL COURSE:  Acute on chronic respiratory failure  -multifactorial; may be due to COPD exacerbation, chronic diastolic heart failure/LVH, obstructive sleep apnea.  -still wheezing but only minimally in lungs-primarily upper airway and associated with hoarseness; hoarseness is chronic No focal infiltrates seen on CXR  - Patient was admitted and given supportive care with steroids, antibiotics, nebulized bronchodilators. With this she made clinical improvement. Given the fact that she's had numerous recent admissions, pulmonology was consulted, she was seen by Dr. Delton Coombes. Pulmonology had no further recommendations from pulmonology apart from close outpatient followup, I will slowly taper off of prednisone, suspect she is in need of long-term prednisone at some point in the near future. She has been advised to completely stop smoking, compliance to medications and the CPAP was emphasized again.  . 2. Diabetes mellitus type 2, controlled  -last HgbA1c 6.9  -follow CBG while on steroids  -was not on meds pre admit  - Continue to monitor her, may need to be started on metformin at some point. At this time she wants to stay off medications for diabetes P.  3. Hypertension  -moderately controlled during her hospital stay - resume HCTZ/triamterene resumed on 6/18, please evaluate for further titration/ addition of other antihypertensive medications during her followup visit with her primary care practitioner or her primary pulmonologist  4. Leukocytosis  -due to steroids , no other evidence of infection. -improving   5. GERD (gastroesophageal reflux disease)  -cont PPI   6. Tobacco abuse  -smoking cessation counseling   7. Chest pain  -d dimer negative (6/16)  -  troponin negative x3  -recurrent nocturnally with features of costochrondritis  - Claims to be chest pain free this morning to me.  8. Sickle cell trait   9. Morbid obesity - Have the emphasized the importance of  weight loss.  10. Obstructive sleep apnea - Patient has been counseled extensively regarding the importance of compliance to CPAP therapy and also to the importance of weight loss.  TODAY-DAY OF DISCHARGE:  Subjective:   Kamri Gotsch today has no headache,no chest abdominal pain,no new weakness tingling or numbness, feels much better wants to go home today.   Objective:   Blood pressure 160/92, pulse 98, temperature 98 F (36.7 C), temperature source Oral, resp. rate 24, height 5' (1.524 m), weight 103 kg (227 lb 1.2 oz), last menstrual period 06/04/2000, SpO2 96.00%.  Intake/Output Summary (Last 24 hours) at 04/30/13 1120 Last data filed at 04/29/13 2233  Gross per 24 hour  Intake    460 ml  Output      0 ml  Net    460 ml   Filed Weights   04/27/13 0400 04/28/13 0406 04/28/13 2323  Weight: 100 kg (220 lb 7.4 oz) 100 kg (220 lb 7.4 oz) 103 kg (227 lb 1.2 oz)    Exam Awake Alert, Oriented *3, No new F.N deficits, Normal affect Jeannette.AT,PERRAL Supple Neck,No JVD, No cervical lymphadenopathy appriciated.  Symmetrical Chest wall movement, Good air movement bilaterally, CTAB RRR,No Gallops,Rubs or new Murmurs, No Parasternal Heave +ve B.Sounds, Abd Soft, Non tender, No organomegaly appriciated, No rebound -guarding or rigidity. No Cyanosis, Clubbing or edema, No new Rash or bruise  DISCHARGE CONDITION: Stable  DISPOSITION: Home  DISCHARGE INSTRUCTIONS:    Activity:  As tolerated with Full fall precautions use walker/cane & assistance as needed  Diet recommendation: Diabetic Diet Heart Healthy diet       Discharge Orders   Future Appointments Provider Department Dept Phone   05/22/2013 4:15 PM Leslye Peer, MD [facility] [phone]   05/26/2013 9:00 AM Leslye Peer, MD [facility] [phone]   Future Orders Complete By Expires     Call MD for:  difficulty breathing, headache or visual disturbances  As directed     Diet - low sodium  heart healthy  As directed     Increase activity slowly  As directed        Follow-up Information   Follow up with Leslye Peer., MD On 05/22/2013. (4:15pm)    Contact information:   [address] [place] [address] [phone]       Follow up with Lonia Blood, MD. Schedule an appointment as soon as possible for a visit in 1 week.   Contact information:   [address] [place] [address] [phone]      Total Time spent on discharge equals 45 minutes.  SignedJeoffrey Massed 04/30/2013 11:20 AM

## 2013-05-04 ENCOUNTER — Telehealth: Payer: Self-pay | Admitting: Emergency Medicine

## 2013-05-04 DIAGNOSIS — J9620 Acute and chronic respiratory failure, unspecified whether with hypoxia or hypercapnia: Secondary | ICD-10-CM

## 2013-05-04 DIAGNOSIS — J449 Chronic obstructive pulmonary disease, unspecified: Secondary | ICD-10-CM

## 2013-05-04 NOTE — Telephone Encounter (Signed)
Spoke with pt  She states that she was recently d/c'ed from the hospital  She was advised nurse with [facility] would come to check on her and she still has not heard from them I see no mention of this on the d/c summary  Do you want to order referral for home health aid for her? Please advise, thanks!

## 2013-05-05 NOTE — Telephone Encounter (Signed)
Spoke with pt and advised that referral has been sent to [facility] for home health evaluation.

## 2013-05-05 NOTE — Telephone Encounter (Signed)
Yes order a HH evaluation to see what services she needs / qualifies for. Thanks.

## 2013-05-08 ENCOUNTER — Inpatient Hospital Stay (HOSPITAL_COMMUNITY)
Admission: AD | Admit: 2013-05-08 | Discharge: 2013-05-11 | DRG: 190 | Disposition: A | Discharge status: Home / Self Care | Payer: Medicaid Other | Source: Other Acute Inpatient Hospital | Attending: Internal Medicine | Admitting: Internal Medicine

## 2013-05-08 DIAGNOSIS — Z6841 Body Mass Index (BMI) 40.0 and over, adult: Secondary | ICD-10-CM

## 2013-05-08 DIAGNOSIS — I10 Essential (primary) hypertension: Secondary | ICD-10-CM | POA: Diagnosis present

## 2013-05-08 DIAGNOSIS — J309 Allergic rhinitis, unspecified: Secondary | ICD-10-CM

## 2013-05-08 DIAGNOSIS — I509 Heart failure, unspecified: Secondary | ICD-10-CM | POA: Diagnosis present

## 2013-05-08 DIAGNOSIS — T380X5A Adverse effect of glucocorticoids and synthetic analogues, initial encounter: Secondary | ICD-10-CM | POA: Diagnosis present

## 2013-05-08 DIAGNOSIS — R7402 Elevation of levels of lactic acid dehydrogenase (LDH): Secondary | ICD-10-CM | POA: Diagnosis present

## 2013-05-08 DIAGNOSIS — J209 Acute bronchitis, unspecified: Secondary | ICD-10-CM

## 2013-05-08 DIAGNOSIS — Z720 Tobacco use: Secondary | ICD-10-CM

## 2013-05-08 DIAGNOSIS — F411 Generalized anxiety disorder: Secondary | ICD-10-CM | POA: Diagnosis present

## 2013-05-08 DIAGNOSIS — E119 Type 2 diabetes mellitus without complications: Secondary | ICD-10-CM | POA: Diagnosis present

## 2013-05-08 DIAGNOSIS — Z91199 Patient's noncompliance with other medical treatment and regimen due to unspecified reason: Secondary | ICD-10-CM

## 2013-05-08 DIAGNOSIS — R7401 Elevation of levels of liver transaminase levels: Secondary | ICD-10-CM | POA: Diagnosis present

## 2013-05-08 DIAGNOSIS — R490 Dysphonia: Secondary | ICD-10-CM

## 2013-05-08 DIAGNOSIS — F17200 Nicotine dependence, unspecified, uncomplicated: Secondary | ICD-10-CM | POA: Diagnosis present

## 2013-05-08 DIAGNOSIS — D72829 Elevated white blood cell count, unspecified: Secondary | ICD-10-CM

## 2013-05-08 DIAGNOSIS — E876 Hypokalemia: Secondary | ICD-10-CM | POA: Diagnosis present

## 2013-05-08 DIAGNOSIS — J449 Chronic obstructive pulmonary disease, unspecified: Secondary | ICD-10-CM

## 2013-05-08 DIAGNOSIS — J9620 Acute and chronic respiratory failure, unspecified whether with hypoxia or hypercapnia: Secondary | ICD-10-CM | POA: Diagnosis present

## 2013-05-08 DIAGNOSIS — E875 Hyperkalemia: Secondary | ICD-10-CM | POA: Diagnosis present

## 2013-05-08 DIAGNOSIS — D573 Sickle-cell trait: Secondary | ICD-10-CM | POA: Diagnosis present

## 2013-05-08 DIAGNOSIS — R040 Epistaxis: Secondary | ICD-10-CM

## 2013-05-08 DIAGNOSIS — R079 Chest pain, unspecified: Secondary | ICD-10-CM

## 2013-05-08 DIAGNOSIS — Z9981 Dependence on supplemental oxygen: Secondary | ICD-10-CM

## 2013-05-08 DIAGNOSIS — J441 Chronic obstructive pulmonary disease with (acute) exacerbation: Principal | ICD-10-CM | POA: Diagnosis present

## 2013-05-08 DIAGNOSIS — Z87891 Personal history of nicotine dependence: Secondary | ICD-10-CM | POA: Diagnosis present

## 2013-05-08 DIAGNOSIS — I5032 Chronic diastolic (congestive) heart failure: Secondary | ICD-10-CM

## 2013-05-08 DIAGNOSIS — K7689 Other specified diseases of liver: Secondary | ICD-10-CM | POA: Diagnosis present

## 2013-05-08 DIAGNOSIS — K219 Gastro-esophageal reflux disease without esophagitis: Secondary | ICD-10-CM | POA: Diagnosis present

## 2013-05-08 DIAGNOSIS — G4733 Obstructive sleep apnea (adult) (pediatric): Secondary | ICD-10-CM | POA: Diagnosis present

## 2013-05-08 DIAGNOSIS — R911 Solitary pulmonary nodule: Secondary | ICD-10-CM

## 2013-05-08 DIAGNOSIS — Z9119 Patient's noncompliance with other medical treatment and regimen: Secondary | ICD-10-CM

## 2013-05-08 DIAGNOSIS — D649 Anemia, unspecified: Secondary | ICD-10-CM | POA: Diagnosis present

## 2013-05-08 HISTORY — DX: Pneumonia, unspecified organism: J18.9

## 2013-05-09 ENCOUNTER — Encounter (HOSPITAL_COMMUNITY): Payer: Self-pay | Admitting: General Practice

## 2013-05-09 DIAGNOSIS — J449 Chronic obstructive pulmonary disease, unspecified: Secondary | ICD-10-CM

## 2013-05-09 DIAGNOSIS — E119 Type 2 diabetes mellitus without complications: Secondary | ICD-10-CM

## 2013-05-09 DIAGNOSIS — J9620 Acute and chronic respiratory failure, unspecified whether with hypoxia or hypercapnia: Secondary | ICD-10-CM

## 2013-05-09 LAB — URINALYSIS W MICROSCOPIC + REFLEX CULTURE
Glucose, UA: NEGATIVE mg/dL
Hgb urine dipstick: NEGATIVE
Specific Gravity, Urine: 1.007 (ref 1.005–1.030)
pH: 7 (ref 5.0–8.0)

## 2013-05-09 LAB — COMPREHENSIVE METABOLIC PANEL
BUN: 24 mg/dL — ABNORMAL HIGH (ref 6–23)
CO2: 33 mEq/L — ABNORMAL HIGH (ref 19–32)
Calcium: 9.2 mg/dL (ref 8.4–10.5)
Chloride: 96 mEq/L (ref 96–112)
Creatinine, Ser: 0.82 mg/dL (ref 0.50–1.10)
GFR calc non Af Amer: 80 mL/min — ABNORMAL LOW (ref >90)
Total Bilirubin: 0.3 mg/dL (ref 0.3–1.2)

## 2013-05-09 LAB — CBC
HCT: 36.3 % (ref 36.0–46.0)
Hemoglobin: 12.7 g/dL (ref 12.0–15.0)
MCH: 29.3 pg (ref 26.0–34.0)
MCHC: 35 g/dL (ref 30.0–36.0)
RBC: 4.33 MIL/uL (ref 3.87–5.11)

## 2013-05-09 LAB — RAPID URINE DRUG SCREEN, HOSP PERFORMED
Cocaine: NOT DETECTED
Opiates: NOT DETECTED

## 2013-05-09 LAB — GLUCOSE, CAPILLARY: Glucose-Capillary: 153 mg/dL — ABNORMAL HIGH (ref 70–99)

## 2013-05-09 MED ORDER — PANTOPRAZOLE SODIUM 40 MG PO TBEC
40.0000 mg | DELAYED_RELEASE_TABLET | Freq: Every day | ORAL | Status: DC
  Administered 2013-05-09 – 2013-05-11 (×3): 40 mg via ORAL
  Filled 2013-05-09 (×3): qty 1

## 2013-05-09 MED ORDER — INSULIN ASPART 100 UNIT/ML ~~LOC~~ SOLN
0.0000 [IU] | Freq: Every day | SUBCUTANEOUS | Status: DC
  Administered 2013-05-09: 2 [IU] via SUBCUTANEOUS
  Administered 2013-05-10: 3 [IU] via SUBCUTANEOUS

## 2013-05-09 MED ORDER — ALBUTEROL SULFATE (5 MG/ML) 0.5% IN NEBU
2.5000 mg | INHALATION_SOLUTION | Freq: Four times a day (QID) | RESPIRATORY_TRACT | Status: DC
  Administered 2013-05-09: 2.5 mg via RESPIRATORY_TRACT
  Filled 2013-05-09: qty 0.5

## 2013-05-09 MED ORDER — FLUTICASONE PROPIONATE 50 MCG/ACT NA SUSP
2.0000 | Freq: Two times a day (BID) | NASAL | Status: DC
  Administered 2013-05-09 – 2013-05-11 (×5): 2 via NASAL
  Filled 2013-05-09: qty 16

## 2013-05-09 MED ORDER — BUDESONIDE-FORMOTEROL FUMARATE 160-4.5 MCG/ACT IN AERO
2.0000 | INHALATION_SPRAY | Freq: Two times a day (BID) | RESPIRATORY_TRACT | Status: DC
  Administered 2013-05-09 – 2013-05-11 (×4): 2 via RESPIRATORY_TRACT
  Filled 2013-05-09: qty 6

## 2013-05-09 MED ORDER — PREDNISONE 20 MG PO TABS
30.0000 mg | ORAL_TABLET | Freq: Every day | ORAL | Status: DC
  Administered 2013-05-09: 30 mg via ORAL
  Filled 2013-05-09 (×2): qty 1

## 2013-05-09 MED ORDER — METHYLPREDNISOLONE SODIUM SUCC 125 MG IJ SOLR: 60.0000 mg | Freq: Two times a day (BID) | INTRAMUSCULAR | Status: DC

## 2013-05-09 MED ORDER — TRIAMTERENE-HCTZ 37.5-25 MG PO TABS
1.0000 | ORAL_TABLET | Freq: Every day | ORAL | Status: DC
  Administered 2013-05-09 – 2013-05-10 (×2): 1 via ORAL
  Filled 2013-05-09 (×2): qty 1

## 2013-05-09 MED ORDER — TRIAMTERENE-HCTZ 37.5-25 MG PO CAPS
1.0000 | ORAL_CAPSULE | Freq: Every day | ORAL | Status: DC
  Filled 2013-05-09: qty 1

## 2013-05-09 MED ORDER — ASPIRIN EC 81 MG PO TBEC
81.0000 mg | DELAYED_RELEASE_TABLET | Freq: Every day | ORAL | Status: DC
  Administered 2013-05-09 – 2013-05-11 (×3): 81 mg via ORAL
  Filled 2013-05-09 (×3): qty 1

## 2013-05-09 MED ORDER — SODIUM CHLORIDE 0.9 % IJ SOLN
3.0000 mL | Freq: Two times a day (BID) | INTRAMUSCULAR | Status: DC
  Administered 2013-05-09 (×3): 3 mL via INTRAVENOUS

## 2013-05-09 MED ORDER — INSULIN ASPART 100 UNIT/ML ~~LOC~~ SOLN
0.0000 [IU] | Freq: Three times a day (TID) | SUBCUTANEOUS | Status: DC
  Administered 2013-05-09: 5 [IU] via SUBCUTANEOUS
  Administered 2013-05-10: 15 [IU] via SUBCUTANEOUS
  Administered 2013-05-10: 3 [IU] via SUBCUTANEOUS
  Administered 2013-05-11: 11 [IU] via SUBCUTANEOUS
  Administered 2013-05-11: 2 [IU] via SUBCUTANEOUS

## 2013-05-09 MED ORDER — METHYLPREDNISOLONE SODIUM SUCC 40 MG IJ SOLR
40.0000 mg | Freq: Two times a day (BID) | INTRAMUSCULAR | Status: DC
  Administered 2013-05-09 – 2013-05-10 (×3): 40 mg via INTRAVENOUS
  Filled 2013-05-09 (×4): qty 1

## 2013-05-09 MED ORDER — HEPARIN SODIUM (PORCINE) 5000 UNIT/ML IJ SOLN
5000.0000 [IU] | Freq: Three times a day (TID) | INTRAMUSCULAR | Status: DC
  Administered 2013-05-09 – 2013-05-11 (×8): 5000 [IU] via SUBCUTANEOUS
  Filled 2013-05-09 (×10): qty 1

## 2013-05-09 MED ORDER — IPRATROPIUM BROMIDE 0.02 % IN SOLN
0.5000 mg | Freq: Four times a day (QID) | RESPIRATORY_TRACT | Status: DC
  Administered 2013-05-09: 0.5 mg via RESPIRATORY_TRACT
  Filled 2013-05-09: qty 2.5

## 2013-05-09 MED ORDER — CYCLOBENZAPRINE HCL 10 MG PO TABS
10.0000 mg | ORAL_TABLET | Freq: Two times a day (BID) | ORAL | Status: DC | PRN
  Administered 2013-05-09 – 2013-05-11 (×4): 10 mg via ORAL
  Filled 2013-05-09 (×5): qty 1

## 2013-05-09 MED ORDER — POTASSIUM CHLORIDE CRYS ER 20 MEQ PO TBCR
20.0000 meq | EXTENDED_RELEASE_TABLET | Freq: Every day | ORAL | Status: DC
  Administered 2013-05-09: 20 meq via ORAL
  Filled 2013-05-09 (×2): qty 1

## 2013-05-09 MED ORDER — ALBUTEROL SULFATE HFA 108 (90 BASE) MCG/ACT IN AERS: 2.0000 | INHALATION_SPRAY | RESPIRATORY_TRACT | Status: DC | PRN

## 2013-05-09 MED ORDER — ALBUTEROL SULFATE (5 MG/ML) 0.5% IN NEBU
2.5000 mg | INHALATION_SOLUTION | Freq: Three times a day (TID) | RESPIRATORY_TRACT | Status: DC
  Administered 2013-05-09 – 2013-05-11 (×6): 2.5 mg via RESPIRATORY_TRACT
  Filled 2013-05-09 (×7): qty 0.5

## 2013-05-09 MED ORDER — IPRATROPIUM BROMIDE 0.02 % IN SOLN
0.5000 mg | Freq: Three times a day (TID) | RESPIRATORY_TRACT | Status: DC
  Administered 2013-05-09 – 2013-05-11 (×6): 0.5 mg via RESPIRATORY_TRACT
  Filled 2013-05-09 (×7): qty 2.5

## 2013-05-09 MED ORDER — GUAIFENESIN ER 600 MG PO TB12
600.0000 mg | ORAL_TABLET | Freq: Two times a day (BID) | ORAL | Status: DC
  Administered 2013-05-09 – 2013-05-11 (×5): 600 mg via ORAL
  Filled 2013-05-09 (×6): qty 1

## 2013-05-09 MED ORDER — TIOTROPIUM BROMIDE MONOHYDRATE 18 MCG IN CAPS: 18.0000 ug | ORAL_CAPSULE | Freq: Every day | RESPIRATORY_TRACT | Status: DC

## 2013-05-09 NOTE — Progress Notes (Addendum)
[facility] PROGRESS NOTE  Stephanie Schultz ZOX:[id number] DOB: [birth year] DOA: 05/08/2013 PCP: Lonia Blood, MD  Brief history 57 yo female with hx HTN, COPD on home O2-2 liters, OSA on 10cm, morbid obesity, ongoing tobacco abuse with recurrent hospitalizations r/t acute on chronic respiratory failure. The patient presented to the emergency department at [facility] after the patient became severely short of breath when she began smoking again. The patient was recently discharged from the hospital after a stay from 04/26/2013-04/30/2013 for COPD exacerbation. The patient was given aerosolized albuterol and Atrovent with some improvement of her wheezing. However the patient continued to have hypoxemia with oxygen saturation of 85% with conversation while still on her supplemental oxygen. D-dimer was negative at [facility]. Chest x-ray at [facility] showed chronic changes. EKG was sinus tachycardia without any acute ST-T wave changes. The patient was subsequently transferred to [facility] for definitive care.  Assessment/Plan: Acute on chronic respiratory failure  -multifactorial; may be due to COPD exacerbation, chronic diastolic heart failure/LVH, obstructive sleep apnea.  -still wheezing but only minimally in lungs-primarily upper airway and associated with hoarseness; hoarseness is chronic -start solumedrol 40mg  IV bid -UDS 2. Diabetes mellitus type 2, controlled  -last HgbA1c 6.9 on 01/20/2013 -follow CBG while on steroids  -was not on meds pre admit  - Continue to monitor her, may need to be started on metformin at some point.  3. Hypertension  - resume HCTZ/triamterene  4. Leukocytosis  -due to steroids -Urinalysis with reflex urine culture 5. Transaminasemia -UDS -RUQ U/S -?related to hepatic steatosis -Trend LFTs -Viral Hepatitis serology negative on 01/15/2013 6. Tobacco abuse  -smoking cessation counseling  7. Sickle cell trait  8. Morbid obesity  - Have the  emphasized the importance of weight loss.  9. Obstructive sleep apnea  - Patient has been counseled extensively regarding the importance of compliance to CPAP therapy and also to the importance of weight loss.   Family Communication:   Pt at beside Disposition Plan:   Home when medically stable          Procedures/Studies: X-ray Chest Pa And Lateral   04/27/2013   *RADIOLOGY REPORT*  Clinical Data: Acute shortness of breath.  CHEST - 2 VIEW  Comparison: Chest x-ray 04/26/2013.  Findings: Lung volumes are normal.  No consolidative airspace disease.  No pleural effusions.  No pneumothorax.  No pulmonary nodule or mass noted.  Pulmonary vasculature and the cardiomediastinal silhouette are within normal limits. Atherosclerosis in the thoracic aorta.  IMPRESSION: 1. No radiographic evidence of acute cardiopulmonary disease. 2.  Atherosclerosis.   Original Report Authenticated By: Trudie Reed, M.D.   Dg Chest Port 1 View  04/26/2013   *RADIOLOGY REPORT*  Clinical Data: Shortness of breath  PORTABLE CHEST - 1 VIEW  Comparison: 04/13/2013  Findings: Cardiomediastinal silhouette is stable.  No acute infiltrate or pleural effusion.  No pulmonary edema.  Stable chronic mild interstitial prominence.  IMPRESSION: No active disease.  No significant change.   Original Report Authenticated By: Natasha Mead, M.D.   Dg Chest Portable 1 View  04/13/2013   *RADIOLOGY REPORT*  Clinical Data: Shortness of breath, cough and congestion.  PORTABLE CHEST - 1 VIEW  Comparison: Single view chest 01/16/2013 and PA and lateral chest 12/05/2011.  CT chest 12/26/2011.  Findings: The lungs are emphysematous but clear.  Heart size is normal.  No pneumothorax or pleural effusion.  IMPRESSION: Emphysema without acute disease.   Original Report Authenticated By: Holley Dexter, M.D.  Subjective: Patient feels that she is breathing a little bit better. Denies any fevers, chills, chest pain, nausea, vomiting,  diarrhea. She has some right upper quadrant abdominal pain. No dysuria hematuria.  Objective: Filed Vitals:   05/09/13 0003 05/09/13 0348 05/09/13 0527 05/09/13 0737  BP: 168/78  143/82   Pulse: 112  92   Temp:   97.8 F (36.6 C)   TempSrc:   Oral   Resp: 18  18   Height:      Weight:   99.791 kg (220 lb)   SpO2: 96% 96% 98% 95%    Intake/Output Summary (Last 24 hours) at 05/09/13 1027 Last data filed at 05/09/13 1003  Gross per 24 hour  Intake    480 ml  Output    825 ml  Net   -345 ml   Weight change:  Exam:   General:  Pt is alert, follows commands appropriately, not in acute distress  HEENT: No icterus, No thrush,  [place]/AT  Cardiovascular: RRR, S1/S2, no rubs, no gallops  Respiratory: Mild bibasilar expiratory wheezes. Good air movement.  Abdomen: Soft/+BS, right upper quadrant tender non distended, no guarding. No peritoneal signs  Extremities: No edema, No lymphangitis, No petechiae, No rashes, no synovitis  Data Reviewed: Basic Metabolic Panel:  Recent Labs Lab 05/09/13 0450  NA 140  K 4.2  CL 96  CO2 33*  GLUCOSE 125*  BUN 24*  CREATININE 0.82  CALCIUM 9.2   Liver Function Tests:  Recent Labs Lab 05/09/13 0450  AST 149*  ALT 284*  ALKPHOS 74  BILITOT 0.3  PROT 7.2  ALBUMIN 3.6   No results found for this basename: LIPASE, AMYLASE,  in the last 168 hours No results found for this basename: AMMONIA,  in the last 168 hours CBC:  Recent Labs Lab 05/09/13 0450  WBC 10.2  HGB 12.7  HCT 36.3  MCV 83.8  PLT 173   Cardiac Enzymes: No results found for this basename: CKTOTAL, CKMB, CKMBINDEX, TROPONINI,  in the last 168 hours BNP: No components found with this basename: POCBNP,  CBG:  Recent Labs Lab 05/09/13 0612  GLUCAP 153*    No results found for this or any previous visit (from the past 240 hour(s)).   Scheduled Meds: . albuterol  2.5 mg Nebulization TID  . aspirin EC  81 mg Oral Daily  . budesonide-formoterol  2 puff  Inhalation BID  . fluticasone  2 spray Each Nare BID  . guaiFENesin  600 mg Oral BID  . heparin  5,000 Units Subcutaneous Q8H  . ipratropium  0.5 mg Nebulization TID  . methylPREDNISolone (SOLU-MEDROL) injection  40 mg Intravenous Q12H  . pantoprazole  40 mg Oral Daily  . potassium chloride SA  20 mEq Oral Daily  . sodium chloride  3 mL Intravenous Q12H  . triamterene-hydrochlorothiazide  1 tablet Oral Daily   Continuous Infusions:    Liza Czerwinski, DO  [facility] Pager [phone]  If 7PM-7AM, please contact night-coverage [email or web address] Password [facility] 05/09/2013, 10:27 AM   LOS: 1 day

## 2013-05-09 NOTE — H&P (Signed)
[facility] History and Physical  Dorrie Cocuzza ZOX:[id number] DOB: 1956-03-15 DOA: 05/08/2013  Referring physician: [facility] PCP: Lonia Blood, MD  Chief Complaint: SOB  HPI: Stephanie Schultz is a 57 y.o. female who is well known to our service and was just in the hospital from 6/15 to 6/19 for COPD exacerbation presents to [facility] with SOB.  The patient admits that she hadnt smoked anything since discharge on 6/19 but she tried to smoke a cigarette again today.  Unfortunately after taking her O2 off and trying to smoke the cigarette she became severely short of breath (so short of breath per report that she couldn't finish the cigarette).  Because she couldn't catch her breath she presented to the ED at [facility].  Review of Systems: 12 systems reviewed and otherwise negative.  Past Medical History  Diagnosis Date  . Borderline diabetes   . Hypertension   . Anxiety   . Emphysema   . COPD (chronic obstructive pulmonary disease)   . OSA on CPAP     CPAP at night   . Dizziness     pt believes this is motion sickness or vertigo  . GERD (gastroesophageal reflux disease)   . Headache(784.0)   . Cancer 1990    cervical   . Anemia   . Morbid obesity   . Sickle cell trait   . Hx of cardiac catheterization     a. LHC at [facility] in [place], [place] 09/2008:  Normal coronary arteries EF 70%.  . Tobacco abuse     a. up to 3ppd from age 28 to 64, now 1/4 ppd (01/2013)  . Shortness of breath   . Pneumonia    Past Surgical History  Procedure Laterality Date  . Tubal ligation    . Cardiac catheterization    . Colonoscopy  09/05/2012    Procedure: COLONOSCOPY;  Surgeon: Theda Belfast, MD;  Location: [facility] ENDOSCOPY;  Service: Endoscopy;  Laterality: N/A;   Social History:  reports that she has been smoking Cigarettes.  She has a 9 pack-year smoking history. She has never used smokeless tobacco. She reports that she does not drink alcohol or use illicit drugs.   No Known  Allergies  Family History  Problem Relation Age of Onset  . Lung cancer Paternal Aunt   . Lung cancer Paternal Grandfather   . Other Father     unaware of father's medical history  . Diabetes Mother     alive @ 55  . Other      multiple siblings a&w.    Prior to Admission medications   Medication Sig Start Date End Date Taking? Authorizing Provider  albuterol (PROVENTIL HFA;VENTOLIN HFA) 108 (90 BASE) MCG/ACT inhaler Inhale 2 puffs into the lungs every 4 (four) hours as needed for wheezing. 03/11/13 03/11/14  Leslye Peer, MD  albuterol (PROVENTIL) (2.5 MG/3ML) 0.083% nebulizer solution Take 3 mLs (2.5 mg total) by nebulization 4 (four) times daily. DX: 496. For wheezing 04/15/13 04/15/14  Ripudeep Jenna Luo, MD  aspirin EC 81 MG tablet Take 81 mg by mouth daily.    Historical Provider, MD  cyclobenzaprine (FLEXERIL) 10 MG tablet Take 10 mg by mouth 2 (two) times daily as needed for muscle spasms.    Historical Provider, MD  diazepam (VALIUM) 10 MG tablet Take 10 mg by mouth at bedtime.    Historical Provider, MD  fluticasone (FLONASE) 50 MCG/ACT nasal spray Place 2 sprays into the nose 2 (two) times daily. 07/08/12 07/08/13  Molly Maduro  Kavin Leech, MD  guaiFENesin (MUCINEX) 600 MG 12 hr tablet Take 1 tablet (600 mg total) by mouth 2 (two) times daily. 04/15/13   Ripudeep Jenna Luo, MD  ipratropium (ATROVENT) 0.02 % nebulizer solution Take 2.5 mLs (0.5 mg total) by nebulization 4 (four) times daily. 04/30/13   Shanker Levora Dredge, MD  levofloxacin (LEVAQUIN) 500 MG tablet Take 1 tablet (500 mg total) by mouth at bedtime. 04/30/13   Shanker Levora Dredge, MD  loratadine (CLARITIN) 10 MG tablet take 1 tablet by mouth once daily 11/14/12   Leslye Peer, MD  miconazole (MICOTIN) 2 % cream Apply 1 application topically 2 (two) times daily.    Historical Provider, MD  omeprazole (PRILOSEC) 20 MG capsule Take 20 mg by mouth 2 (two) times daily.    Historical Provider, MD  potassium chloride SA (K-DUR,KLOR-CON) 20 MEQ tablet  Take 20 mEq by mouth See admin instructions. Sometimes 20 MEQ takes once daily, but usually 20 MEQ twice daily.    Historical Provider, MD  predniSONE (STERAPRED UNI-PAK) 10 MG tablet Take Prednisone 40mg  (4 tabs) x 3 days, then  30mg  (3 tabs) x 3 days, then 20mg  (2 tabs) x 3days, then 10mg  (1 tab) x 3days, then OFF. 04/30/13   Shanker Levora Dredge, MD  SPIRIVA HANDIHALER 18 MCG inhalation capsule inhale contents of 1 capsule by mouth once daily 12/15/12   Leslye Peer, MD  SYMBICORT 160-4.5 MCG/ACT inhaler inhale 2 puffs by mouth twice a day 04/10/13   Leslye Peer, MD  triamterene-hydrochlorothiazide (DYAZIDE) 37.5-25 MG per capsule take 1 capsule by mouth once daily 08/11/12   Leslye Peer, MD  vitamin B-12 (CYANOCOBALAMIN) 500 MCG tablet Take 500 mcg by mouth daily.    Historical Provider, MD   Physical Exam: Filed Vitals:   05/08/13 2258 05/08/13 2300 05/09/13 0003  BP: 138/87  168/78  Pulse: 99 110 112  Temp: 97.8 F (36.6 C)    TempSrc: Oral    Resp: 17 18 18   Height: 5' (1.524 m)    Weight: 101.379 kg (223 lb 8 oz)    SpO2: 95%  96%    General:  NAD, resting comfortably in bed Eyes: PEERLA EOMI ENT: mucous membranes moist Neck: supple w/o JVD Cardiovascular: RRR w/o MRG Respiratory: Rales B, short of breath Abdomen: soft, nt, nd, bs+ Skin: no rash nor lesion Musculoskeletal: MAE, full ROM all 4 extremities Psychiatric: normal tone and affect Neurologic: AAOx3, grossly non-focal  Labs on Admission:  Basic Metabolic Panel: No results found for this basename: NA, K, CL, CO2, GLUCOSE, BUN, CREATININE, CALCIUM, MG, PHOS,  in the last 168 hours Liver Function Tests: No results found for this basename: AST, ALT, ALKPHOS, BILITOT, PROT, ALBUMIN,  in the last 168 hours No results found for this basename: LIPASE, AMYLASE,  in the last 168 hours No results found for this basename: AMMONIA,  in the last 168 hours CBC: No results found for this basename: WBC, NEUTROABS, HGB, HCT,  MCV, PLT,  in the last 168 hours Cardiac Enzymes: No results found for this basename: CKTOTAL, CKMB, CKMBINDEX, TROPONINI,  in the last 168 hours  BNP (last 3 results)  Recent Labs  01/17/13 1410  PROBNP 103.2   CBG: No results found for this basename: GLUCAP,  in the last 168 hours  Radiological Exams on Admission: No results found.  EKG: Independently reviewed.  Assessment/Plan Active Problems:   COPD (chronic obstructive pulmonary disease)   Diabetes mellitus type 2, controlled  Acute and chronic respiratory failure   Morbid obesity   COPD exacerbation   1. COPD - mild exacerbation possibly but seems to be more chronic and I think what happened is more that she took her O2 off and tried to smoke a cigarette which caused her to desat.  Have talked at length with the patient about the need to quit smoking.  Lungs dont really have severe wheezes at this time, will put patient on prednisone 30mg  daily (had been down to 20mg  daily on her taper at home). 2. DM2 - monitor CBG checks AC/HS, add SSI if needed especially as she is on prednisone, dosent appear to be on home meds though    Code Status: Full Code (must indicate code status--if unknown or must be presumed, indicate so) Family Communication: No family in room (indicate person spoken with, if applicable, with phone number if by telephone) Disposition Plan: Admit to inpatient (indicate anticipated LOS)  Time spent: 70 min  Kaira Stringfield M. [facility] Pager [phone]  If 7PM-7AM, please contact night-coverage [email or web address] Password [facility] 05/09/2013, 2:33 AM

## 2013-05-09 NOTE — Progress Notes (Signed)
Pt continues to be SOB with exertion but is constantly removing her oxygen. O2 replaced as needed

## 2013-05-10 ENCOUNTER — Inpatient Hospital Stay (HOSPITAL_COMMUNITY): Payer: Medicaid Other

## 2013-05-10 DIAGNOSIS — J441 Chronic obstructive pulmonary disease with (acute) exacerbation: Principal | ICD-10-CM

## 2013-05-10 DIAGNOSIS — I10 Essential (primary) hypertension: Secondary | ICD-10-CM

## 2013-05-10 LAB — COMPREHENSIVE METABOLIC PANEL
ALT: 281 U/L — ABNORMAL HIGH (ref 0–35)
AST: 97 U/L — ABNORMAL HIGH (ref 0–37)
Alkaline Phosphatase: 79 U/L (ref 39–117)
CO2: 33 mEq/L — ABNORMAL HIGH (ref 19–32)
Calcium: 9.6 mg/dL (ref 8.4–10.5)
Chloride: 100 mEq/L (ref 96–112)
GFR calc non Af Amer: 77 mL/min — ABNORMAL LOW (ref >90)
Potassium: 5.2 mEq/L — ABNORMAL HIGH (ref 3.5–5.1)
Sodium: 141 mEq/L (ref 135–145)
Total Bilirubin: 0.3 mg/dL (ref 0.3–1.2)

## 2013-05-10 LAB — GLUCOSE, CAPILLARY: Glucose-Capillary: 184 mg/dL — ABNORMAL HIGH (ref 70–99)

## 2013-05-10 MED ORDER — LORAZEPAM 1 MG PO TABS
1.0000 mg | ORAL_TABLET | Freq: Four times a day (QID) | ORAL | Status: DC | PRN
  Administered 2013-05-10: 1 mg via ORAL
  Filled 2013-05-10: qty 1

## 2013-05-10 MED ORDER — ACETAMINOPHEN 325 MG PO TABS
650.0000 mg | ORAL_TABLET | Freq: Four times a day (QID) | ORAL | Status: DC | PRN
  Administered 2013-05-11: 650 mg via ORAL
  Filled 2013-05-10: qty 2

## 2013-05-10 MED ORDER — PREDNISONE 20 MG PO TABS
40.0000 mg | ORAL_TABLET | Freq: Every day | ORAL | Status: DC
  Administered 2013-05-11: 40 mg via ORAL
  Filled 2013-05-10 (×2): qty 2

## 2013-05-10 MED ORDER — SODIUM CHLORIDE 0.9 % IV SOLN
INTRAVENOUS | Status: DC
  Administered 2013-05-10: 17:00:00 via INTRAVENOUS

## 2013-05-10 NOTE — Progress Notes (Signed)
[facility] PROGRESS NOTE  Stephanie Schultz ZOX:[id number] DOB: 1956/09/13 DOA: 05/08/2013 PCP: Lonia Blood, MD  Assessment/Plan: Acute on chronic respiratory failure  -multifactorial; may be due to COPD exacerbation, chronic diastolic heart failure/LVH, obstructive sleep apnea.  -wheezing has improved; hoarseness is chronic  -de-escalate solumedrol to po prednisone -UDS--neg 0. Hyperkalemia -Likely due to the patient's potassium supplementation as well as her triamterene -Discontinue potassium supplementation, discontinue triamterene 1. COPD exacerbation -Clinically improving on intravenous steroids -Change to by mouth steroids 2. Diabetes mellitus type 2, controlled  -last HgbA1c 6.9 on 01/20/2013  -follow CBG while on steroids  -was not on meds pre admit  - Continue to monitor her, may need to be started on metformin at some point.  -Noncompliance with diet -Nursing staff has noted that patient has been eating cookies and candy bars and other outside food 3. Hypertension  - resume HCTZ/triamterene  4. Leukocytosis  -due to steroids  -Urinalysis -negative for pyuria 5. Transaminasemia  -UDS  -RUQ U/S  -?related to hepatic steatosis  -Trend LFTs--trending down  -Viral Hepatitis serology negative on 01/15/2013  -Check HIV serology 6. Tobacco abuse  -smoking cessation counseling  7. Sickle cell trait  8. Morbid obesity  - Have the emphasized the importance of weight loss.  9. Obstructive sleep apnea  - Patient has been counseled extensively regarding the importance of compliance to CPAP therapy and also to the importance of weight loss.  Family Communication: sister at beside  Disposition Plan: Home when medically stable           Procedures/Studies: X-ray Chest Pa And Lateral   04/27/2013   *RADIOLOGY REPORT*  Clinical Data: Acute shortness of breath.  CHEST - 2 VIEW  Comparison: Chest x-ray 04/26/2013.  Findings: Lung volumes are normal.  No consolidative  airspace disease.  No pleural effusions.  No pneumothorax.  No pulmonary nodule or mass noted.  Pulmonary vasculature and the cardiomediastinal silhouette are within normal limits. Atherosclerosis in the thoracic aorta.  IMPRESSION: 1. No radiographic evidence of acute cardiopulmonary disease. 2.  Atherosclerosis.   Original Report Authenticated By: Trudie Reed, M.D.   US Abdomen Complete  05/10/2013   *RADIOLOGY REPORT*  Clinical Data:  Right upper quadrant pain  ABDOMINAL ULTRASOUND COMPLETE  Comparison:  01/15/2013  Findings:  Gallbladder:  No gallstones, gallbladder wall thickening, or pericholecystic fluid.  Common Bile Duct:  Within normal limits in caliber.  Liver: There is diffusely increased echogenicity without focal mass.  IVC:  Appears normal.  Pancreas:  The pancreas was poorly visualized.  No obvious mass.  Spleen:  Within normal limits in size and echotexture.  Right kidney:  Normal in size and parenchymal echogenicity.  No evidence of mass or hydronephrosis.  Left kidney:  Normal in size and parenchymal echogenicity.  No evidence of mass or hydronephrosis.  Abdominal Aorta:  Portions of the aorta were obscured.  No obvious aneurysm.  IMPRESSION: Limited visualization of the pancreas and aorta.  Probable diffuse hepatic steatosis.  Stable examination.   Original Report Authenticated By: Jolaine Click, M.D.   Dg Chest Port 1 View  04/26/2013   *RADIOLOGY REPORT*  Clinical Data: Shortness of breath  PORTABLE CHEST - 1 VIEW  Comparison: 04/13/2013  Findings: Cardiomediastinal silhouette is stable.  No acute infiltrate or pleural effusion.  No pulmonary edema.  Stable chronic mild interstitial prominence.  IMPRESSION: No active disease.  No significant change.   Original Report Authenticated By: Natasha Mead, M.D.   Dg Chest Portable 1 View  04/13/2013   *  RADIOLOGY REPORT*  Clinical Data: Shortness of breath, cough and congestion.  PORTABLE CHEST - 1 VIEW  Comparison: Single view chest 01/16/2013  and PA and lateral chest 12/05/2011.  CT chest 12/26/2011.  Findings: The lungs are emphysematous but clear.  Heart size is normal.  No pneumothorax or pleural effusion.  IMPRESSION: Emphysema without acute disease.   Original Report Authenticated By: Holley Dexter, M.D.         Subjective: Patient states her breathing has improved. Denies any dizziness, nausea, vomiting, diarrhea, abdominal pain, dysuria, hematuria. C/o chest pain.  Objective: Filed Vitals:   05/09/13 2106 05/10/13 0623 05/10/13 1419 05/10/13 1449  BP:  138/70  137/97  Pulse:  102  109  Temp:  98 F (36.7 C)  98 F (36.7 C)  TempSrc:  Oral  Oral  Resp:  18  19  Height:      Weight:  99.202 kg (218 lb 11.2 oz)    SpO2: 96% 98% 96% 98%    Intake/Output Summary (Last 24 hours) at 05/10/13 1522 Last data filed at 05/10/13 0931  Gross per 24 hour  Intake    120 ml  Output   1500 ml  Net  -1380 ml   Weight change: -2.177 kg (-4 lb 12.8 oz) Exam:   General:  Pt is alert, follows commands appropriately, not in acute distress  HEENT: No icterus, No thrush, [place]/AT  Cardiovascular: RRR, S1/S2, no rubs, no gallops  Respiratory: Diminished breath sounds bilaterally. No wheezing. Good air movement  Abdomen: Soft/+BS, non tender, non distended, no guarding  Extremities: trace edema, No lymphangitis, No petechiae, No rashes, no synovitis  Data Reviewed: Basic Metabolic Panel:  Recent Labs Lab 05/09/13 0450 05/10/13 0542  NA 140 141  K 4.2 5.2*  CL 96 100  CO2 33* 33*  GLUCOSE 125* 225*  BUN 24* 27*  CREATININE 0.82 0.84  CALCIUM 9.2 9.6   Liver Function Tests:  Recent Labs Lab 05/09/13 0450 05/10/13 0542  AST 149* 97*  ALT 284* 281*  ALKPHOS 74 79  BILITOT 0.3 0.3  PROT 7.2 7.5  ALBUMIN 3.6 3.6   No results found for this basename: LIPASE, AMYLASE,  in the last 168 hours No results found for this basename: AMMONIA,  in the last 168 hours CBC:  Recent Labs Lab 05/09/13 0450  WBC  10.2  HGB 12.7  HCT 36.3  MCV 83.8  PLT 173   Cardiac Enzymes: No results found for this basename: CKTOTAL, CKMB, CKMBINDEX, TROPONINI,  in the last 168 hours BNP: No components found with this basename: POCBNP,  CBG:  Recent Labs Lab 05/09/13 0612 05/09/13 1613 05/09/13 2134 05/10/13 0555 05/10/13 1055  GLUCAP 153* 213* 243* 196* 184*    No results found for this or any previous visit (from the past 240 hour(s)).   Scheduled Meds: . albuterol  2.5 mg Nebulization TID  . aspirin EC  81 mg Oral Daily  . budesonide-formoterol  2 puff Inhalation BID  . fluticasone  2 spray Each Nare BID  . guaiFENesin  600 mg Oral BID  . heparin  5,000 Units Subcutaneous Q8H  . insulin aspart  0-15 Units Subcutaneous TID WC  . insulin aspart  0-5 Units Subcutaneous QHS  . ipratropium  0.5 mg Nebulization TID  . methylPREDNISolone (SOLU-MEDROL) injection  40 mg Intravenous Q12H  . pantoprazole  40 mg Oral Daily  . sodium chloride  3 mL Intravenous Q12H   Continuous Infusions:    Laquandra Carrillo,  DO  [facility] Pager [phone]  If 7PM-7AM, please contact night-coverage [email or web address] Password [email or web address] 05/10/2013, 3:22 PM   LOS: 2 days

## 2013-05-10 NOTE — Discharge Summary (Signed)
Physician Discharge Summary  Stephanie Schultz WJX:[id number] DOB: [birth year] DOA: 05/08/2013  PCP: Lonia Blood, MD  Admit date: 05/08/2013 Discharge date: 05/11/2013  Recommendations for Outpatient Follow-up:  1. Pt will need to follow up with PCP in 1 week post discharge 2. Please obtain BMP to evaluate electrolytes and kidney function 3. Please also check CBC to evaluate Hg and Hct levels 4. Please monitor LFTs in 1 week Followup for HIV antibody test results  Discharge Diagnoses:  Active Problems:   COPD (chronic obstructive pulmonary disease)   Diabetes mellitus type 2, controlled   Hypertension   Acute and chronic respiratory failure   Tobacco abuse   Morbid obesity   COPD exacerbation   Transaminasemia Acute on chronic respiratory failure  -multifactorial; may be due to COPD exacerbation, chronic diastolic heart failure/LVH, obstructive sleep apnea.  -wheezing has improved; hoarseness is chronic  -de-escalate solumedrol to po prednisone  -UDS--neg  0. Hyperkalemia  -Likely due to the patient's potassium supplementation as well as her triamterene  -Discontinue potassium supplementation, discontinue triamterene  -judicious fluids were given -K improved with above measures -Patient will not be restarted on her triamterene or potassium supplementation 1. COPD exacerbation  -Clinically improving on intravenous steroids  -Change to by mouth steroids  -home with weaning prednisone, 40mg  starting 05/12/13 -Plan to wean 10 mg every 24 hours starting with prednisone 40 mg daily -continue usual home 2 liters [place] which she was using before admit 2. Diabetes mellitus type 2, controlled  -last HgbA1c 6.9 on 01/20/2013  -follow CBG while on steroids--CBGs were in the 200s during the hospitalization -was not on meds pre admit  -Noncompliance with diet  -Nursing staff has noted that patient has been eating cookies and candy bars and other outside food  -Patient was given a prescription for  metformin 500 mg daily at time of d/c -she will f/u with primary care physician for further titration -Patient was instructed to follow up with her primary care provider for further adjustment of her diabetic regimen 3. Hypertension  - due to the patient's hyperkalemia, Dyazide was d/ced -with the patient's tachycardia, pt started on  Metoprolol 12.5 mg twice a day -Echocardiogram on 01/15/2013 showed ejection fraction 65-70% with moderate LVH -The patient experienced chest pain during the hospitalization. EKG and troponins were negative. 3a.  Chest pain/Tachycardia -D-dimer was negative on 05/08/2013 and was also previously negative 04/27/2013 -Troponins were negative x3 -EKG did not show any acute ST to T wave change -Patient was started on metoprolol 12.5 mg twice a day 4. Leukocytosis  -due to steroids  -Urinalysis -negative for pyuria  5. Transaminasemia  -UDSneg -RUQ U/S--negative for gallbladder wall thickening or cholelithiasis. Negative CBD, + hepatic steatosis  -?related to hepatic steatosis  -Trend LFTs--trending down  -Viral Hepatitis serology negative on 01/15/2013  -Check HIV serology--pending at the time of discharge  -Patient was instructed to follow up with her primary care provider to continue workup and followup 6. Tobacco abuse  -smoking cessation counseling  7. Sickle cell trait  8. Morbid obesity  - Have the emphasized the importance of weight loss.  9. Obstructive sleep apnea  - Patient has been counseled extensively regarding the importance of compliance to CPAP therapy and also to the importance of weight loss.      Discharge Condition: stable  Disposition:  Follow-up Information   Follow up with [facility], MD In 1 week.   Contact information:   [address] [place] [address] [phone]     home  Diet:1800 calorie ADA Wt Readings from Last 3 Encounters:  05/11/13 100.835 kg (222 lb 4.8 oz)  04/28/13 103 kg (227 lb 1.2 oz)   04/13/13 93.6 kg (206 lb 5.6 oz)    Hospital Course:  57 yo female with hx HTN, COPD on home O2-2 liters, OSA on 10cm, morbid obesity, ongoing tobacco abuse with recurrent hospitalizations r/t acute on chronic respiratory failure. The patient presented to the emergency department at [facility] after the patient became severely short of breath when she began smoking again. The patient was recently discharged from the hospital after a stay from 04/26/2013-04/30/2013 for COPD exacerbation. The patient was given aerosolized albuterol and Atrovent with some improvement of her wheezing. However the patient continued to have hypoxemia with oxygen saturation of 85% with conversation while still on her supplemental oxygen. D-dimer was negative at [facility]. Chest x-ray at [facility] showed chronic changes. EKG was sinus tachycardia without any acute ST-T wave changes. The patient was subsequently transferred to [facility] for definitive care. The patient was found to be wheezing on the first hospital day. The patient was started on intravenous Solu-Medrol twice a day. Her Solu-Medrol was gradually decreased and the escalated to oral prednisone. The patient clinically improved without any further respiratory distress. She was maintained on her home O2. During the hospitalization, the patient was found to be noncompliant with her diabetic diet. A variety of outside food was brought in including cookies and candy bars. The patient was provided instructions regarding a diabetic diet. At the time of discharge, the patient was started on metformin 500 mg daily. She will follow up with her primary care physician who adjusted her medications as necessary. The patient was found to be hypokalemic. Judicious fluids were provided. Her triamterene was discontinued as well as potassium supplement patient with improvement of her potassium. The patient was noted to be intermittently tachycardic throughout the hospitalization  normally 100-110 range. This was felt to be due to her acute medical condition as well as her nebulizer treatments. The patient was given judicious fluids without significant improvement. Echocardiogram previously did not show any wall motion abnormalities on 01/25/2013. As a result, the patient was started on metoprolol tartrate 12.5 mg twice a day.     Discharge Exam: Filed Vitals:   05/11/13 0917  BP: 152/81  Pulse: 108  Temp:   Resp:    Filed Vitals:   05/10/13 2118 05/11/13 0451 05/11/13 0838 05/11/13 0917  BP:  160/88 132/68 152/81  Pulse: 110 99 102 108  Temp:  98.3 F (36.8 C)    TempSrc:  Oral    Resp: 17 20    Height:      Weight:  100.835 kg (222 lb 4.8 oz)    SpO2: 100% 94% 98%    General: A&O x 3, NAD, pleasant, cooperative Cardiovascular: RRR, no rub, no gallop, no S3 Respiratory: Scattered rales bilaterally. No wheezing. Good air movement. Abdomen:soft, nontender, nondistended, positive bowel sounds Extremities: trace edema, No lymphangitis, no petechiae  Discharge Instructions      Discharge Orders   Future Appointments Provider Department Dept Phone   05/22/2013 4:15 PM Leslye Peer, MD [facility] [phone]   05/26/2013 9:00 AM Leslye Peer, MD  [facility] [phone]   Future Orders Complete By Expires     Diet - low sodium heart healthy  As directed     Discharge instructions  As directed     Comments:      Prednisone 40mg  (  4 tabs)--start on 05/12/13 morning and decrease by one tablet daily until gone.    Increase activity slowly  As directed         Medication List    STOP taking these medications       levofloxacin 500 MG tablet  Commonly known as:  LEVAQUIN     potassium chloride SA 20 MEQ tablet  Commonly known as:  K-DUR,KLOR-CON     predniSONE 10 MG tablet  Commonly known as:  STERAPRED UNI-PAK  Replaced by:  predniSONE 10 MG tablet     triamterene-hydrochlorothiazide 37.5-25 MG per capsule  Commonly  known as:  DYAZIDE      TAKE these medications       albuterol 108 (90 BASE) MCG/ACT inhaler  Commonly known as:  PROVENTIL HFA;VENTOLIN HFA  Inhale 2 puffs into the lungs every 4 (four) hours as needed for wheezing.     albuterol (2.5 MG/3ML) 0.083% nebulizer solution  Commonly known as:  PROVENTIL  - Take 3 mLs (2.5 mg total) by nebulization 4 (four) times daily. DX: 496.  - For wheezing     aspirin EC 81 MG tablet  Take 81 mg by mouth daily.     budesonide-formoterol 160-4.5 MCG/ACT inhaler  Commonly known as:  SYMBICORT  Inhale 2 puffs into the lungs 2 (two) times daily.     cyclobenzaprine 10 MG tablet  Commonly known as:  FLEXERIL  Take 10 mg by mouth 2 (two) times daily as needed for muscle spasms.     diazepam 10 MG tablet  Commonly known as:  VALIUM  Take 10 mg by mouth at bedtime.     fluticasone 50 MCG/ACT nasal spray  Commonly known as:  FLONASE  Place 2 sprays into the nose 2 (two) times daily.     guaiFENesin 600 MG 12 hr tablet  Commonly known as:  MUCINEX  Take 1 tablet (600 mg total) by mouth 2 (two) times daily.     ipratropium 0.02 % nebulizer solution  Commonly known as:  ATROVENT  Take 2.5 mLs (0.5 mg total) by nebulization 4 (four) times daily.     loratadine 10 MG tablet  Commonly known as:  CLARITIN  Take 10 mg by mouth daily.     metFORMIN 500 MG tablet  Commonly known as:  GLUCOPHAGE  Take 1 tablet (500 mg total) by mouth daily with breakfast.     metoprolol tartrate 12.5 mg Tabs  Commonly known as:  LOPRESSOR  Take 0.5 tablets (12.5 mg total) by mouth 2 (two) times daily.     omeprazole 20 MG capsule  Commonly known as:  PRILOSEC  Take 20 mg by mouth 2 (two) times daily.     predniSONE 10 MG tablet  Commonly known as:  DELTASONE  Take 4 tablets (40 mg total) by mouth daily with breakfast. Start 05/12/13.  Decrease by 1 tablet every day.     tiotropium 18 MCG inhalation capsule  Commonly known as:  SPIRIVA  Place 18 mcg into  inhaler and inhale daily.     vitamin B-12 500 MCG tablet  Commonly known as:  CYANOCOBALAMIN  Take 500 mcg by mouth daily.         The results of significant diagnostics from this hospitalization (including imaging, microbiology, ancillary and laboratory) are listed below for reference.    Significant Diagnostic Studies: X-ray Chest Pa And Lateral   04/27/2013   *RADIOLOGY REPORT*  Clinical Data: Acute shortness of breath.  CHEST -  2 VIEW  Comparison: Chest x-ray 04/26/2013.  Findings: Lung volumes are normal.  No consolidative airspace disease.  No pleural effusions.  No pneumothorax.  No pulmonary nodule or mass noted.  Pulmonary vasculature and the cardiomediastinal silhouette are within normal limits. Atherosclerosis in the thoracic aorta.  IMPRESSION: 1. No radiographic evidence of acute cardiopulmonary disease. 2.  Atherosclerosis.   Original Report Authenticated By: Trudie Reed, M.D.   US Abdomen Complete  05/10/2013   *RADIOLOGY REPORT*  Clinical Data:  Right upper quadrant pain  ABDOMINAL ULTRASOUND COMPLETE  Comparison:  01/15/2013  Findings:  Gallbladder:  No gallstones, gallbladder wall thickening, or pericholecystic fluid.  Common Bile Duct:  Within normal limits in caliber.  Liver: There is diffusely increased echogenicity without focal mass.  IVC:  Appears normal.  Pancreas:  The pancreas was poorly visualized.  No obvious mass.  Spleen:  Within normal limits in size and echotexture.  Right kidney:  Normal in size and parenchymal echogenicity.  No evidence of mass or hydronephrosis.  Left kidney:  Normal in size and parenchymal echogenicity.  No evidence of mass or hydronephrosis.  Abdominal Aorta:  Portions of the aorta were obscured.  No obvious aneurysm.  IMPRESSION: Limited visualization of the pancreas and aorta.  Probable diffuse hepatic steatosis.  Stable examination.   Original Report Authenticated By: Jolaine Click, M.D.   Dg Chest Port 1 View  04/26/2013   *RADIOLOGY  REPORT*  Clinical Data: Shortness of breath  PORTABLE CHEST - 1 VIEW  Comparison: 04/13/2013  Findings: Cardiomediastinal silhouette is stable.  No acute infiltrate or pleural effusion.  No pulmonary edema.  Stable chronic mild interstitial prominence.  IMPRESSION: No active disease.  No significant change.   Original Report Authenticated By: Natasha Mead, M.D.   Dg Chest Portable 1 View  04/13/2013   *RADIOLOGY REPORT*  Clinical Data: Shortness of breath, cough and congestion.  PORTABLE CHEST - 1 VIEW  Comparison: Single view chest 01/16/2013 and PA and lateral chest 12/05/2011.  CT chest 12/26/2011.  Findings: The lungs are emphysematous but clear.  Heart size is normal.  No pneumothorax or pleural effusion.  IMPRESSION: Emphysema without acute disease.   Original Report Authenticated By: Holley Dexter, M.D.     Microbiology: No results found for this or any previous visit (from the past 240 hour(s)).   Labs: Basic Metabolic Panel:  Recent Labs Lab 05/09/13 0450 05/10/13 0542 05/11/13 0907  NA 140 141 139  K 4.2 5.2* 4.7  CL 96 100 95*  CO2 33* 33* 31  GLUCOSE 125* 225* 259*  BUN 24* 27* 28*  CREATININE 0.82 0.84 0.82  CALCIUM 9.2 9.6 9.5   Liver Function Tests:  Recent Labs Lab 05/09/13 0450 05/10/13 0542  AST 149* 97*  ALT 284* 281*  ALKPHOS 74 79  BILITOT 0.3 0.3  PROT 7.2 7.5  ALBUMIN 3.6 3.6   No results found for this basename: LIPASE, AMYLASE,  in the last 168 hours No results found for this basename: AMMONIA,  in the last 168 hours CBC:  Recent Labs Lab 05/09/13 0450  WBC 10.2  HGB 12.7  HCT 36.3  MCV 83.8  PLT 173   Cardiac Enzymes:  Recent Labs Lab 05/10/13 1810 05/10/13 2357 05/11/13 0910  TROPONINI <0.30 <0.30 <0.30   BNP: No components found with this basename: POCBNP,  CBG:  Recent Labs Lab 05/10/13 1055 05/10/13 1605 05/10/13 2109 05/11/13 0629 05/11/13 1102  GLUCAP 184* 459* 263* 121* 318*    Time coordinating discharge:  Greater than 30 minutes  Signed:  Blimy Napoleon, DO [facility] Pager: [phone] 05/11/2013, 1:25 PM

## 2013-05-10 NOTE — Progress Notes (Signed)
Pt  Called desk to c/o chest pain. When I went in to room she said that she had lower back pain.  I asked if she had chest pain like she reported    To desk and then she grabbed her chest and "yes and chest pain too." bp154/81  Sat 97% on 2l [place]. Ekg done and shown to Dr Tat. While doing ekg pt had been lying in bed quietly.

## 2013-05-11 DIAGNOSIS — R079 Chest pain, unspecified: Secondary | ICD-10-CM

## 2013-05-11 LAB — TROPONIN I
Troponin I: <0.3 ng/mL (ref <0.30)
Troponin I: <0.3 ng/mL (ref <0.30)

## 2013-05-11 LAB — BASIC METABOLIC PANEL
BUN: 28 mg/dL — ABNORMAL HIGH (ref 6–23)
Calcium: 9.5 mg/dL (ref 8.4–10.5)
Creatinine, Ser: 0.82 mg/dL (ref 0.50–1.10)
GFR calc Af Amer: >90 mL/min (ref >90)
GFR calc non Af Amer: 80 mL/min — ABNORMAL LOW (ref >90)

## 2013-05-11 MED ORDER — METOPROLOL TARTRATE 12.5 MG HALF TABLET: 12.5000 mg | ORAL_TABLET | Freq: Two times a day (BID) | ORAL | Status: DC

## 2013-05-11 MED ORDER — PREDNISONE 10 MG PO TABS: 40.0000 mg | ORAL_TABLET | Freq: Every day | ORAL | Status: DC

## 2013-05-11 MED ORDER — METFORMIN HCL 500 MG PO TABS: 500.0000 mg | ORAL_TABLET | Freq: Every day | ORAL | Status: DC

## 2013-05-11 MED ORDER — METOPROLOL TARTRATE 12.5 MG HALF TABLET
12.5000 mg | ORAL_TABLET | Freq: Two times a day (BID) | ORAL | Status: DC
  Filled 2013-05-11 (×2): qty 1

## 2013-05-11 NOTE — Plan of Care (Signed)
Problem: Undesirable Food Choices (NB-1.7) Goal: Nutrition education Formal process to instruct or train a patient/client in a skill or to impart knowledge to help patients/clients voluntarily manage or modify food choices and eating behavior to maintain or improve health. Outcome: Completed/Met Date Met:  05/11/13 RD consulted for diet education regarding DM.  Pt ready for d/c, tech preparing to take pt out of room. RD provided pt with "carbohydrate counting for diabetes" hand out from the Academy of [employer]. Provided phone number to contact if has any questions. Pt denies any questions regarding her diet at this time.   Clarene Duke RD, LDN Pager [phone] After Hours pager [phone]

## 2013-05-11 NOTE — Progress Notes (Signed)
Pt being dc to home, pt given dc instructions, follow up appointments and medications reviewed, pt given information on smoking cessation, pt verbalized understanding, pt left via wheelchair with family by cab

## 2013-05-11 NOTE — Progress Notes (Signed)
Inpatient Diabetes Program Recommendations  AACE/ADA: New Consensus Statement on Inpatient Glycemic Control (2013)  Target Ranges:  Prepandial:   less than 140 mg/dL      Peak postprandial:   less than 180 mg/dL (1-2 hours)      Critically ill patients:  140 - 180 mg/dL   Results for JEMA, DEEGAN (MRN [id number]) as of 05/11/2013 11:20  Ref. Range 05/10/2013 05:55 05/10/2013 10:55 05/10/2013 16:05 05/10/2013 21:09 05/11/2013 06:29 05/11/2013 11:02  Glucose-Capillary Latest Range: 70-99 mg/dL 409 (H) 811 (H) 914 (H) 263 (H) 121 (H) 318 (H)    Inpatient Diabetes Program Recommendations Correction (SSI): Please consider increasing Novolog correction to resistant scale. Insulin - Meal Coverage: Please consider adding Novolog 4 units TID meal coverage.  Note: According to the H&P, patient has a history of borderline diabetes and was not on any medications for diabetes management prior to admission.  Blood glucose over the past 24 hours has ranged from 121-459 mg/dl.  Noted in progress note on 6/30 from Dr. Arbutus Leas that nursing staff reports that patient has been eating cookies, candy, and other outside food being brought in.  Please increase Novolog correction to resistant scale and add Novolog 4 units TID meal coverage to improve inpatient glycemic control.  Will place dietician consult for diabetic diet teaching and will continue to follow.  Thanks, Orlando Penner, RN, MSN, CCRN Diabetes Coordinator Inpatient Diabetes Program [phone]

## 2013-05-12 NOTE — Progress Notes (Signed)
Utilization Review Completed.   Gulianna Hornsby, RN, BSN Nurse Case Manager  [phone]  

## 2013-05-13 ENCOUNTER — Inpatient Hospital Stay (HOSPITAL_COMMUNITY)
Admission: EM | Admit: 2013-05-13 | Discharge: 2013-05-17 | DRG: 191 | Discharge status: Against Medical Advice | Payer: Medicaid Other | Attending: Internal Medicine | Admitting: Internal Medicine

## 2013-05-13 ENCOUNTER — Emergency Department (HOSPITAL_COMMUNITY): Payer: Medicaid Other

## 2013-05-13 ENCOUNTER — Encounter (HOSPITAL_COMMUNITY): Payer: Self-pay | Admitting: Emergency Medicine

## 2013-05-13 DIAGNOSIS — J441 Chronic obstructive pulmonary disease with (acute) exacerbation: Principal | ICD-10-CM | POA: Diagnosis present

## 2013-05-13 DIAGNOSIS — R490 Dysphonia: Secondary | ICD-10-CM | POA: Diagnosis present

## 2013-05-13 DIAGNOSIS — R0789 Other chest pain: Secondary | ICD-10-CM | POA: Diagnosis present

## 2013-05-13 DIAGNOSIS — D573 Sickle-cell trait: Secondary | ICD-10-CM | POA: Diagnosis present

## 2013-05-13 DIAGNOSIS — F17200 Nicotine dependence, unspecified, uncomplicated: Secondary | ICD-10-CM | POA: Diagnosis present

## 2013-05-13 DIAGNOSIS — IMO0002 Reserved for concepts with insufficient information to code with codable children: Secondary | ICD-10-CM

## 2013-05-13 DIAGNOSIS — K219 Gastro-esophageal reflux disease without esophagitis: Secondary | ICD-10-CM | POA: Diagnosis present

## 2013-05-13 DIAGNOSIS — J9620 Acute and chronic respiratory failure, unspecified whether with hypoxia or hypercapnia: Secondary | ICD-10-CM

## 2013-05-13 DIAGNOSIS — E119 Type 2 diabetes mellitus without complications: Secondary | ICD-10-CM

## 2013-05-13 DIAGNOSIS — Z801 Family history of malignant neoplasm of trachea, bronchus and lung: Secondary | ICD-10-CM

## 2013-05-13 DIAGNOSIS — Z833 Family history of diabetes mellitus: Secondary | ICD-10-CM

## 2013-05-13 DIAGNOSIS — I10 Essential (primary) hypertension: Secondary | ICD-10-CM | POA: Diagnosis present

## 2013-05-13 DIAGNOSIS — G4733 Obstructive sleep apnea (adult) (pediatric): Secondary | ICD-10-CM | POA: Diagnosis present

## 2013-05-13 DIAGNOSIS — R197 Diarrhea, unspecified: Secondary | ICD-10-CM | POA: Diagnosis present

## 2013-05-13 DIAGNOSIS — R079 Chest pain, unspecified: Secondary | ICD-10-CM | POA: Diagnosis present

## 2013-05-13 DIAGNOSIS — Z6841 Body Mass Index (BMI) 40.0 and over, adult: Secondary | ICD-10-CM

## 2013-05-13 LAB — BASIC METABOLIC PANEL
BUN: 25 mg/dL — ABNORMAL HIGH (ref 6–23)
Creatinine, Ser: 0.92 mg/dL (ref 0.50–1.10)
GFR calc Af Amer: 80 mL/min — ABNORMAL LOW (ref >90)
GFR calc non Af Amer: 69 mL/min — ABNORMAL LOW (ref >90)
Glucose, Bld: 183 mg/dL — ABNORMAL HIGH (ref 70–99)
Potassium: 4.6 mEq/L (ref 3.5–5.1)

## 2013-05-13 LAB — CBC WITH DIFFERENTIAL/PLATELET
Basophils Relative: 0 % (ref 0–1)
Eosinophils Absolute: 0 10*3/uL (ref 0.0–0.7)
Eosinophils Relative: 0 % (ref 0–5)
HCT: 37.7 % (ref 36.0–46.0)
Hemoglobin: 13.4 g/dL (ref 12.0–15.0)
Lymphs Abs: 1.7 10*3/uL (ref 0.7–4.0)
MCH: 29.8 pg (ref 26.0–34.0)
MCHC: 35.5 g/dL (ref 30.0–36.0)
MCV: 83.8 fL (ref 78.0–100.0)
Monocytes Absolute: 0.4 10*3/uL (ref 0.1–1.0)
Monocytes Relative: 3 % (ref 3–12)
Neutrophils Relative %: 84 % — ABNORMAL HIGH (ref 43–77)
RBC: 4.5 MIL/uL (ref 3.87–5.11)

## 2013-05-13 LAB — POCT I-STAT 3, VENOUS BLOOD GAS (G3P V)
Acid-Base Excess: 5 mmol/L — ABNORMAL HIGH (ref 0.0–2.0)
O2 Saturation: 67 %
Patient temperature: 98
pO2, Ven: 37 mmHg (ref 30.0–45.0)

## 2013-05-13 MED ORDER — AZITHROMYCIN 250 MG PO TABS
500.0000 mg | ORAL_TABLET | Freq: Once | ORAL | Status: AC
  Administered 2013-05-14: 500 mg via ORAL
  Filled 2013-05-13: qty 2

## 2013-05-13 MED ORDER — ONDANSETRON HCL 4 MG/2ML IJ SOLN: 4.0000 mg | Freq: Three times a day (TID) | INTRAMUSCULAR | Status: DC | PRN

## 2013-05-13 MED ORDER — ALBUTEROL (5 MG/ML) CONTINUOUS INHALATION SOLN
INHALATION_SOLUTION | RESPIRATORY_TRACT | Status: AC
  Administered 2013-05-13: 10 mg
  Filled 2013-05-13: qty 20

## 2013-05-13 MED ORDER — ALBUTEROL SULFATE (5 MG/ML) 0.5% IN NEBU
INHALATION_SOLUTION | RESPIRATORY_TRACT | Status: AC
  Administered 2013-05-13: 5 mg
  Filled 2013-05-13: qty 1

## 2013-05-13 MED ORDER — IPRATROPIUM BROMIDE 0.02 % IN SOLN
RESPIRATORY_TRACT | Status: AC
  Administered 2013-05-13: 20:00:00
  Filled 2013-05-13: qty 2.5

## 2013-05-13 NOTE — ED Notes (Signed)
Respiratory paged and notified of continuous neb request per Dr lozier at bedside

## 2013-05-13 NOTE — ED Notes (Addendum)
Pt's nebulizer mask had been taken off, and the pt had not been given any supplemental O2. The respiratory therapist states that she did not take the mask off of the pt. Pt was satting at 85%. This RN put the pt on 2 L of supplemental 02, and her sats rose to 93%. Pt rapidly desats when she coughs.

## 2013-05-13 NOTE — ED Notes (Signed)
Dr. Rancour at bedside. 

## 2013-05-13 NOTE — ED Provider Notes (Signed)
History    CSN: [id number] Arrival date & time 05/13/13  4098  First MD Initiated Contact with Patient 05/13/13 1854     Chief Complaint  Patient presents with  . Shortness of Breath  . COPD   HPI Stephanie Schultz is a 57 year old female with an extensive past medical history including COPD GERD, pneumonia, morbid obesity, and emphysema presents emergency department for worsening shortness of breath. She was recently discharged from the hospital 6 days ago for a COPD exacerbation. She's been short of breath since that time. The shortness of breath has been continuous. It worsened with exertion. The shortness of breath was initially improved with her inhalers but has become less responsive. She became more short of breath today and called EMS. She just reports a productive cough of green sputum that has been worsening over past 4-5 days as well. She also reports left-sided pleuritic chest pain that has continued since her last hospitalization and has been worsening. She denies fevers chills sweats nausea vomiting. She also has right ear quadrant abdominal pain and what she had an abdominal ultrasound for several days ago and showed no evidence of cholecystitis. She also reports clay colored stools and diarrhea. Past Medical History  Diagnosis Date  . Borderline diabetes   . Hypertension   . Anxiety   . Emphysema   . COPD (chronic obstructive pulmonary disease)   . OSA on CPAP     CPAP at night   . Dizziness     pt believes this is motion sickness or vertigo  . GERD (gastroesophageal reflux disease)   . Headache(784.0)   . Cancer 1990    cervical   . Anemia   . Morbid obesity   . Sickle cell trait   . Hx of cardiac catheterization     a. LHC at [facility] in [place], [place] 09/2008:  Normal coronary arteries EF 70%.  . Tobacco abuse     a. up to 3ppd from age 8 to 29, now 1/4 ppd (01/2013)  . Shortness of breath   . Pneumonia    Past Surgical History  Procedure Laterality Date  . Tubal  ligation    . Cardiac catheterization    . Colonoscopy  09/05/2012    Procedure: COLONOSCOPY;  Surgeon: Theda Belfast, MD;  Location: [facility];  Service: Endoscopy;  Laterality: N/A;   Family History  Problem Relation Age of Onset  . Lung cancer Paternal Aunt   . Lung cancer Paternal Grandfather   . Other Father     unaware of father's medical history  . Diabetes Mother     alive @ 51  . Other      multiple siblings a&w.   History  Substance Use Topics  . Smoking status: Current Every Day Smoker -- 0.25 packs/day for 36 years    Types: Cigarettes  . Smokeless tobacco: Never Used     Comment: Approx 90 pk-yrs (up to 3ppd until ~ 2009). Smoking 5 cigs per day now.  . Alcohol Use: No   OB History   Grav Para Term Preterm Abortions TAB SAB Ect Mult Living                 Review of Systems  Constitutional: Negative for fever, chills, diaphoresis, activity change and appetite change.  HENT: Negative for sore throat, rhinorrhea, sneezing, drooling and trouble swallowing.   Eyes: Negative for discharge and redness.  Respiratory: Positive for cough, shortness of breath and wheezing. Negative for chest  tightness and stridor.   Cardiovascular: Positive for chest pain. Negative for leg swelling.  Gastrointestinal: Negative for nausea, vomiting, abdominal pain, diarrhea, constipation and blood in stool.  Genitourinary: Negative for difficulty urinating.  Musculoskeletal: Negative for myalgias and arthralgias.  Skin: Negative for pallor.  Neurological: Negative for dizziness, syncope, speech difficulty, weakness, light-headedness and headaches.  Hematological: Negative for adenopathy. Does not bruise/bleed easily.  Psychiatric/Behavioral: Negative for confusion and agitation.    Allergies  Review of patient's allergies indicates no known allergies.  Home Medications   Current Outpatient Rx  Name  Route  Sig  Dispense  Refill  . albuterol (PROVENTIL HFA;VENTOLIN HFA) 108 (90  BASE) MCG/ACT inhaler   Inhalation   Inhale 2 puffs into the lungs every 4 (four) hours as needed for wheezing.   1 Inhaler   3   . albuterol (PROVENTIL) (2.5 MG/3ML) 0.083% nebulizer solution   Nebulization   Take 3 mLs (2.5 mg total) by nebulization 4 (four) times daily. DX: 496. For wheezing   75 mL   12   . aspirin EC 81 MG tablet   Oral   Take 81 mg by mouth daily.         . budesonide-formoterol (SYMBICORT) 160-4.5 MCG/ACT inhaler   Inhalation   Inhale 2 puffs into the lungs 2 (two) times daily.         . cyclobenzaprine (FLEXERIL) 10 MG tablet   Oral   Take 10 mg by mouth 2 (two) times daily as needed for muscle spasms.         . diazepam (VALIUM) 10 MG tablet   Oral   Take 10 mg by mouth at bedtime.         . fluticasone (FLONASE) 50 MCG/ACT nasal spray   Nasal   Place 2 sprays into the nose 2 (two) times daily.   16 g   11   . guaiFENesin (MUCINEX) 600 MG 12 hr tablet   Oral   Take 1 tablet (600 mg total) by mouth 2 (two) times daily.   30 tablet   0   . ipratropium (ATROVENT) 0.02 % nebulizer solution   Nebulization   Take 2.5 mLs (0.5 mg total) by nebulization 4 (four) times daily.   75 mL   12   . loratadine (CLARITIN) 10 MG tablet   Oral   Take 10 mg by mouth daily.         . metFORMIN (GLUCOPHAGE) 500 MG tablet   Oral   Take 1 tablet (500 mg total) by mouth daily with breakfast.   30 tablet   0   . metoprolol tartrate (LOPRESSOR) 12.5 mg TABS   Oral   Take 0.5 tablets (12.5 mg total) by mouth 2 (two) times daily.   60 tablet   0   . omeprazole (PRILOSEC) 20 MG capsule   Oral   Take 20 mg by mouth 2 (two) times daily.         Marland Kitchen oxycodone (OXY-IR) 5 MG capsule   Oral   Take 5 mg by mouth every 6 (six) hours as needed for pain.         . predniSONE (DELTASONE) 10 MG tablet   Oral   Take 4 tablets (40 mg total) by mouth daily with breakfast. Start 05/12/13.  Decrease by 1 tablet every day.   10 tablet   0   .  tiotropium (SPIRIVA) 18 MCG inhalation capsule   Inhalation   Place 18 mcg into  inhaler and inhale daily.         . vitamin B-12 (CYANOCOBALAMIN) 500 MCG tablet   Oral   Take 500 mcg by mouth daily.          BP 121/61  Pulse 102  Temp(Src) 98.3 F (36.8 C) (Oral)  Resp 29  SpO2 97%  LMP 06/04/2000 Physical Exam  Constitutional: She is oriented to person, place, and time. She appears well-developed and well-nourished. She appears distressed Stage manager).  HENT:  Head: Normocephalic and atraumatic.  Right Ear: External ear normal.  Left Ear: External ear normal.  Eyes: Conjunctivae and EOM are normal. Right eye exhibits no discharge. Left eye exhibits no discharge.  Neck: Normal range of motion. Neck supple. No JVD present.  Cardiovascular: Normal rate, regular rhythm and normal heart sounds.  Exam reveals no gallop and no friction rub.   No murmur heard. Pulmonary/Chest: No stridor. She is in respiratory distress (coarse expiratory wheezing, decreased breath sounds). She has wheezes. She has no rales. She exhibits no tenderness.  Abdominal: Soft. Bowel sounds are normal. She exhibits no distension. There is no tenderness. There is no rebound and no guarding.  Musculoskeletal: Normal range of motion. She exhibits no edema.  Neurological: She is alert and oriented to person, place, and time.  Skin: Skin is warm. No rash noted. She is not diaphoretic.  Psychiatric: She has a normal mood and affect. Her behavior is normal.    ED Course  Procedures (including critical care time) Labs Reviewed  CBC WITH DIFFERENTIAL - Abnormal; Notable for the following:    WBC 13.7 (*)    RDW 15.7 (*)    Neutrophils Relative % 84 (*)    Neutro Abs 11.5 (*)    All other components within normal limits  BASIC METABOLIC PANEL - Abnormal; Notable for the following:    Glucose, Bld 183 (*)    BUN 25 (*)    GFR calc non Af Amer 69 (*)    GFR calc Af Amer 80 (*)    All other components within  normal limits  POCT I-STAT 3, BLOOD GAS (G3P V) - Abnormal; Notable for the following:    pH, Ven 7.357 (*)    pCO2, Ven 57.2 (*)    Bicarbonate 32.2 (*)    Acid-Base Excess 5.0 (*)    All other components within normal limits  BLOOD GAS, VENOUS  POCT I-STAT TROPONIN I   Dg Chest Portable 1 View  05/13/2013   *RADIOLOGY REPORT*  Clinical Data: Shortness of breath and cough.  PORTABLE CHEST - 1 VIEW  Comparison: Plain films of the chest 05/08/2013.  Findings: The lungs are clear.  Heart size is normal.  No pneumothorax or pleural fluid.  Study is somewhat limited by the patient's body habitus and portable technique.  IMPRESSION: No acute abnormality.   Original Report Authenticated By: Holley Dexter, M.D.   1. COPD exacerbation    Results for orders placed during the hospital encounter of 05/13/13  CBC WITH DIFFERENTIAL      Result Value Range   WBC 13.7 (*) 4.0 - 10.5 K/uL   RBC 4.50  3.87 - 5.11 MIL/uL   Hemoglobin 13.4  12.0 - 15.0 g/dL   HCT 16.1  09.6 - 04.5 %   MCV 83.8  78.0 - 100.0 fL   MCH 29.8  26.0 - 34.0 pg   MCHC 35.5  30.0 - 36.0 g/dL   RDW 40.9 (*) 81.1 - 91.4 %   Platelets  196  150 - 400 K/uL   Neutrophils Relative % 84 (*) 43 - 77 %   Neutro Abs 11.5 (*) 1.7 - 7.7 K/uL   Lymphocytes Relative 13  12 - 46 %   Lymphs Abs 1.7  0.7 - 4.0 K/uL   Monocytes Relative 3  3 - 12 %   Monocytes Absolute 0.4  0.1 - 1.0 K/uL   Eosinophils Relative 0  0 - 5 %   Eosinophils Absolute 0.0  0.0 - 0.7 K/uL   Basophils Relative 0  0 - 1 %   Basophils Absolute 0.0  0.0 - 0.1 K/uL  BASIC METABOLIC PANEL      Result Value Range   Sodium 140  135 - 145 mEq/L   Potassium 4.6  3.5 - 5.1 mEq/L   Chloride 98  96 - 112 mEq/L   CO2 31  19 - 32 mEq/L   Glucose, Bld 183 (*) 70 - 99 mg/dL   BUN 25 (*) 6 - 23 mg/dL   Creatinine, Ser 1.61  0.50 - 1.10 mg/dL   Calcium 9.6  8.4 - 09.6 mg/dL   GFR calc non Af Amer 69 (*) >90 mL/min   GFR calc Af Amer 80 (*) >90 mL/min  POCT I-STAT 3, BLOOD  GAS (G3P V)      Result Value Range   pH, Ven 7.357 (*) 7.250 - 7.300   pCO2, Ven 57.2 (*) 45.0 - 50.0 mmHg   pO2, Ven 37.0  30.0 - 45.0 mmHg   Bicarbonate 32.2 (*) 20.0 - 24.0 mEq/L   TCO2 34  0 - 100 mmol/L   O2 Saturation 67.0     Acid-Base Excess 5.0 (*) 0.0 - 2.0 mmol/L   Patient temperature 98.0 F     Collection site BRACHIAL ARTERY     Sample type VENOUS     Comment NOTIFIED PHYSICIAN    POCT I-STAT TROPONIN I      Result Value Range   Troponin i, poc 0.05  0.00 - 0.08 ng/mL   Comment 3              Date: 05/14/2013  Rate: 98  Rhythm: normal sinus rhythm  QRS Axis: normal  Intervals: normal  ST/T Wave abnormalities: nonspecific T wave changes  Conduction Disutrbances:none  Narrative Interpretation: NSR  Old EKG Reviewed: unchanged   MDM  [name] 57 y.o. presents with worsening shortness of breath since she was discharged recently. In route with EMS patient received 125 mg of Solu-Medrol, 1 treatment of albuterol, and one treatment of Atrovent. On arrival she was tachypneic, tachycardic, and in respiratory distress. She was started on a continuous DuoNeb tx with some improvement. CXR neg for PNA/PTX. PH 7.35, no acidosis. PCO2 elevated, which could be cw her COPD. Trop neg and doubt ACS as symptoms have persisted now for 4 days. Patient continues to report shortness of breath. There is persistent wheezing as well. Admission for acute COPD exacerbation indicated. Gave 500 mg Azithromycin in ED. Patient admitted. I discussed this patient;s care with my attending, Dr. Manus Gunning.  Sena Hitch, MD 05/14/13 [phone]

## 2013-05-13 NOTE — ED Notes (Signed)
Per EMS: shortness of breath since last Thursday, possible fever, yellow green mucus with cough since Thursday, and diarrhea. Pt hx of COPD. Started IV and given 125 solumedrol, 5 of albuterol; diminished throughout, lungs opened up after treatment. VSS 127/87 sat 97% on neb treatment HR 94 NSR CBG 153

## 2013-05-14 ENCOUNTER — Encounter (HOSPITAL_COMMUNITY): Payer: Self-pay | Admitting: Internal Medicine

## 2013-05-14 DIAGNOSIS — I10 Essential (primary) hypertension: Secondary | ICD-10-CM

## 2013-05-14 DIAGNOSIS — G4733 Obstructive sleep apnea (adult) (pediatric): Secondary | ICD-10-CM

## 2013-05-14 DIAGNOSIS — E119 Type 2 diabetes mellitus without complications: Secondary | ICD-10-CM

## 2013-05-14 DIAGNOSIS — J441 Chronic obstructive pulmonary disease with (acute) exacerbation: Principal | ICD-10-CM

## 2013-05-14 LAB — BLOOD GAS, ARTERIAL
Drawn by: 281201
O2 Content: 3 L/min
O2 Saturation: 97.2 %
Patient temperature: 98.6
pO2, Arterial: 102 mmHg — ABNORMAL HIGH (ref 80.0–100.0)

## 2013-05-14 LAB — TROPONIN I
Troponin I: <0.3 ng/mL (ref <0.30)
Troponin I: <0.3 ng/mL (ref <0.30)

## 2013-05-14 LAB — CBC WITH DIFFERENTIAL/PLATELET
Eosinophils Relative: 0 % (ref 0–5)
HCT: 35.9 % — ABNORMAL LOW (ref 36.0–46.0)
Lymphocytes Relative: 8 % — ABNORMAL LOW (ref 12–46)
Lymphs Abs: 1.3 10*3/uL (ref 0.7–4.0)
MCV: 84.1 fL (ref 78.0–100.0)
Monocytes Absolute: 0.5 10*3/uL (ref 0.1–1.0)
RBC: 4.27 MIL/uL (ref 3.87–5.11)
WBC: 17.2 10*3/uL — ABNORMAL HIGH (ref 4.0–10.5)

## 2013-05-14 LAB — COMPREHENSIVE METABOLIC PANEL
Albumin: 3.6 g/dL (ref 3.5–5.2)
BUN: 26 mg/dL — ABNORMAL HIGH (ref 6–23)
Creatinine, Ser: 0.77 mg/dL (ref 0.50–1.10)
Total Protein: 7.6 g/dL (ref 6.0–8.3)

## 2013-05-14 LAB — D-DIMER, QUANTITATIVE: D-Dimer, Quant: 0.3 ug/mL-FEU (ref 0.00–0.48)

## 2013-05-14 LAB — GLUCOSE, CAPILLARY
Glucose-Capillary: 359 mg/dL — ABNORMAL HIGH (ref 70–99)
Glucose-Capillary: 236 mg/dL — ABNORMAL HIGH (ref 70–99)

## 2013-05-14 LAB — PRO B NATRIURETIC PEPTIDE: Pro B Natriuretic peptide (BNP): 46.2 pg/mL (ref 0–125)

## 2013-05-14 MED ORDER — FLUTICASONE PROPIONATE 50 MCG/ACT NA SUSP
2.0000 | Freq: Every day | NASAL | Status: DC
  Administered 2013-05-14 – 2013-05-17 (×4): 2 via NASAL
  Filled 2013-05-14 (×2): qty 16

## 2013-05-14 MED ORDER — ONDANSETRON HCL 4 MG/2ML IJ SOLN: 4.0000 mg | Freq: Four times a day (QID) | INTRAMUSCULAR | Status: DC | PRN

## 2013-05-14 MED ORDER — ACETAMINOPHEN 325 MG PO TABS
650.0000 mg | ORAL_TABLET | Freq: Four times a day (QID) | ORAL | Status: DC | PRN
  Administered 2013-05-17: 650 mg via ORAL
  Filled 2013-05-14: qty 2

## 2013-05-14 MED ORDER — VITAMIN B-12 1000 MCG PO TABS
500.0000 ug | ORAL_TABLET | Freq: Every day | ORAL | Status: DC
  Administered 2013-05-14 – 2013-05-17 (×4): 500 ug via ORAL
  Filled 2013-05-14 (×4): qty 0.5

## 2013-05-14 MED ORDER — METHYLPREDNISOLONE SODIUM SUCC 125 MG IJ SOLR
80.0000 mg | Freq: Three times a day (TID) | INTRAMUSCULAR | Status: DC
  Filled 2013-05-14 (×2): qty 1.28

## 2013-05-14 MED ORDER — ONDANSETRON HCL 4 MG PO TABS: 4.0000 mg | ORAL_TABLET | Freq: Four times a day (QID) | ORAL | Status: DC | PRN

## 2013-05-14 MED ORDER — SODIUM CHLORIDE 0.9 % IJ SOLN
3.0000 mL | Freq: Two times a day (BID) | INTRAMUSCULAR | Status: DC
  Administered 2013-05-14 – 2013-05-17 (×8): 3 mL via INTRAVENOUS

## 2013-05-14 MED ORDER — METOPROLOL TARTRATE 12.5 MG HALF TABLET
12.5000 mg | ORAL_TABLET | Freq: Two times a day (BID) | ORAL | Status: DC
  Administered 2013-05-14 – 2013-05-15 (×4): 12.5 mg via ORAL
  Filled 2013-05-14 (×5): qty 1

## 2013-05-14 MED ORDER — ALBUTEROL SULFATE (5 MG/ML) 0.5% IN NEBU: 2.5000 mg | INHALATION_SOLUTION | RESPIRATORY_TRACT | Status: DC | PRN

## 2013-05-14 MED ORDER — CYCLOBENZAPRINE HCL 10 MG PO TABS
10.0000 mg | ORAL_TABLET | Freq: Two times a day (BID) | ORAL | Status: DC | PRN
  Administered 2013-05-17: 10 mg via ORAL
  Filled 2013-05-14: qty 1

## 2013-05-14 MED ORDER — LORATADINE 10 MG PO TABS
10.0000 mg | ORAL_TABLET | Freq: Every day | ORAL | Status: DC
  Administered 2013-05-14 – 2013-05-17 (×4): 10 mg via ORAL
  Filled 2013-05-14 (×4): qty 1

## 2013-05-14 MED ORDER — IPRATROPIUM BROMIDE 0.02 % IN SOLN
0.5000 mg | Freq: Four times a day (QID) | RESPIRATORY_TRACT | Status: DC
  Administered 2013-05-14 – 2013-05-17 (×14): 0.5 mg via RESPIRATORY_TRACT
  Filled 2013-05-14 (×15): qty 2.5

## 2013-05-14 MED ORDER — METHYLPREDNISOLONE SODIUM SUCC 125 MG IJ SOLR
80.0000 mg | Freq: Three times a day (TID) | INTRAMUSCULAR | Status: DC
  Administered 2013-05-14 – 2013-05-16 (×6): 80 mg via INTRAVENOUS
  Filled 2013-05-14 (×9): qty 1.28

## 2013-05-14 MED ORDER — ENOXAPARIN SODIUM 40 MG/0.4ML ~~LOC~~ SOLN
40.0000 mg | SUBCUTANEOUS | Status: DC
  Administered 2013-05-14 – 2013-05-17 (×4): 40 mg via SUBCUTANEOUS
  Filled 2013-05-14 (×4): qty 0.4

## 2013-05-14 MED ORDER — ACETAMINOPHEN 650 MG RE SUPP: 650.0000 mg | Freq: Four times a day (QID) | RECTAL | Status: DC | PRN

## 2013-05-14 MED ORDER — GUAIFENESIN ER 600 MG PO TB12
1200.0000 mg | ORAL_TABLET | Freq: Two times a day (BID) | ORAL | Status: DC
  Administered 2013-05-14 – 2013-05-17 (×7): 1200 mg via ORAL
  Filled 2013-05-14 (×9): qty 2

## 2013-05-14 MED ORDER — GUAIFENESIN ER 600 MG PO TB12
600.0000 mg | ORAL_TABLET | Freq: Two times a day (BID) | ORAL | Status: DC
  Administered 2013-05-14: 600 mg via ORAL
  Filled 2013-05-14 (×2): qty 1

## 2013-05-14 MED ORDER — DEXTROSE 5 % IV SOLN
500.0000 mg | INTRAVENOUS | Status: DC
  Administered 2013-05-14 – 2013-05-16 (×3): 500 mg via INTRAVENOUS
  Filled 2013-05-14 (×3): qty 500

## 2013-05-14 MED ORDER — PANTOPRAZOLE SODIUM 40 MG PO TBEC
40.0000 mg | DELAYED_RELEASE_TABLET | Freq: Every day | ORAL | Status: DC
  Administered 2013-05-14 – 2013-05-17 (×4): 40 mg via ORAL
  Filled 2013-05-14 (×4): qty 1

## 2013-05-14 MED ORDER — HYDRALAZINE HCL 20 MG/ML IJ SOLN: 10.0000 mg | INTRAMUSCULAR | Status: DC | PRN

## 2013-05-14 MED ORDER — OXYCODONE HCL 5 MG PO TABS
5.0000 mg | ORAL_TABLET | ORAL | Status: DC | PRN
  Administered 2013-05-15 – 2013-05-17 (×7): 5 mg via ORAL
  Filled 2013-05-14 (×7): qty 1

## 2013-05-14 MED ORDER — GUAIFENESIN-DM 100-10 MG/5ML PO SYRP
5.0000 mL | ORAL_SOLUTION | ORAL | Status: DC | PRN
  Administered 2013-05-14 – 2013-05-17 (×2): 5 mL via ORAL
  Filled 2013-05-14 (×2): qty 5

## 2013-05-14 MED ORDER — LEVALBUTEROL HCL 0.63 MG/3ML IN NEBU
0.6300 mg | INHALATION_SOLUTION | Freq: Four times a day (QID) | RESPIRATORY_TRACT | Status: DC
  Administered 2013-05-14 – 2013-05-17 (×13): 0.63 mg via RESPIRATORY_TRACT
  Filled 2013-05-14 (×24): qty 3

## 2013-05-14 MED ORDER — INSULIN ASPART 100 UNIT/ML ~~LOC~~ SOLN
0.0000 [IU] | Freq: Three times a day (TID) | SUBCUTANEOUS | Status: DC
  Administered 2013-05-14: 7 [IU] via SUBCUTANEOUS
  Administered 2013-05-14 (×2): 3 [IU] via SUBCUTANEOUS
  Administered 2013-05-15: 7 [IU] via SUBCUTANEOUS
  Administered 2013-05-15: 2 [IU] via SUBCUTANEOUS

## 2013-05-14 MED ORDER — LEVOFLOXACIN IN D5W 500 MG/100ML IV SOLN
500.0000 mg | INTRAVENOUS | Status: DC
  Administered 2013-05-14 – 2013-05-15 (×2): 500 mg via INTRAVENOUS
  Filled 2013-05-14 (×3): qty 100

## 2013-05-14 MED ORDER — ALBUTEROL SULFATE (5 MG/ML) 0.5% IN NEBU
2.5000 mg | INHALATION_SOLUTION | Freq: Four times a day (QID) | RESPIRATORY_TRACT | Status: DC
  Administered 2013-05-14: 2.5 mg via RESPIRATORY_TRACT
  Filled 2013-05-14 (×2): qty 0.5

## 2013-05-14 MED ORDER — SODIUM CHLORIDE 0.9 % IJ SOLN
3.0000 mL | Freq: Two times a day (BID) | INTRAMUSCULAR | Status: DC
  Administered 2013-05-14: 3 mL via INTRAVENOUS

## 2013-05-14 MED ORDER — METHYLPREDNISOLONE SODIUM SUCC 40 MG IJ SOLR
40.0000 mg | Freq: Every day | INTRAMUSCULAR | Status: DC
  Administered 2013-05-14: 40 mg via INTRAVENOUS
  Filled 2013-05-14: qty 1

## 2013-05-14 MED ORDER — ASPIRIN EC 325 MG PO TBEC
325.0000 mg | DELAYED_RELEASE_TABLET | Freq: Every day | ORAL | Status: DC
  Administered 2013-05-14 – 2013-05-17 (×4): 325 mg via ORAL
  Filled 2013-05-14 (×4): qty 1

## 2013-05-14 MED ORDER — BUDESONIDE 0.25 MG/2ML IN SUSP
0.2500 mg | Freq: Two times a day (BID) | RESPIRATORY_TRACT | Status: DC
  Administered 2013-05-14 – 2013-05-17 (×5): 0.25 mg via RESPIRATORY_TRACT
  Filled 2013-05-14 (×12): qty 2

## 2013-05-14 MED ORDER — LEVALBUTEROL HCL 0.63 MG/3ML IN NEBU
0.6300 mg | INHALATION_SOLUTION | RESPIRATORY_TRACT | Status: DC | PRN
  Administered 2013-05-15 – 2013-05-17 (×4): 0.63 mg via RESPIRATORY_TRACT
  Filled 2013-05-14 (×4): qty 3

## 2013-05-14 MED ORDER — OXYCODONE HCL 5 MG PO TABS: 5.0000 mg | ORAL_TABLET | Freq: Four times a day (QID) | ORAL | Status: DC | PRN

## 2013-05-14 NOTE — Progress Notes (Signed)
Pt. Admitted to unit this am from the ED. Pt. Alert and oriented. BP 190/83 HR 109 on admission. Scheduled BP medication administered as ordered and effective. Pt. SOB at rest and with exertion. Pt. Oriented to room and call light placed within reach. Pt. Denies pain. RN will continue to monitor pt. For changes in condition. Stephanie Schultz, Stephanie Schultz

## 2013-05-14 NOTE — Progress Notes (Addendum)
Pt found to have medications in her room, staff saw pt take pill, pt stated she needs her prednisone, nurse educated pt that she is recieveving IV solumedrol and does not need her po prednisone, pt then verbalized understanding, medications removed and being sent home with care giver, pt educated on policy on home medications, Dr Janee Morn notified and aware  Pt has mepliex to buttock for scratch, pt inc of urine no further skin breakdowns noted

## 2013-05-14 NOTE — Progress Notes (Signed)
Pt c/o c/p this am, EKG done, vss, Dr. Janee Morn notified, pt stated after EKG her pain subsided, no PRN's needed at this time, will continue to monitor

## 2013-05-14 NOTE — Progress Notes (Signed)
Spoke with pt in regards to CPAP ordered, pt currently refusing. Pt verbalizes understanding to call if at any point this decision changes. RT will assist as needed.

## 2013-05-14 NOTE — Progress Notes (Signed)
Pt. Refused cpap. 

## 2013-05-14 NOTE — Plan of Care (Signed)
Problem: Phase I Progression Outcomes Goal: Pain controlled Outcome: Completed/Met Date Met:  05/14/13 Pt has not had any c/o pain Goal: Tolerating diet Outcome: Completed/Met Date Met:  05/14/13 Pt tolerating diet, no c/o n/v

## 2013-05-14 NOTE — H&P (Signed)
[facility] History and Physical  Stephanie Schultz ZDG:[id number] DOB: 1956-10-03 DOA: 05/13/2013  Referring physician: ER physician. PCP: Lonia Blood, MD   Chief Complaint: Shortness of breath.  HPI: Stephanie Schultz is a 57 y.o. female with known history of COPD and ongoing tobacco abuse who was recently admitted for COPD exacerbation presents to the ER because of persistent shortness of breath. Patient states that after her discharge she was better but her symptoms started to recur. She was short of breath at rest also increased on exertion. Had productive cough denies any fever chills. In addition patient also has been having some chest pressure off and on. In the ER patient was found to have wheezing bilaterally and was placed on nebulizer and steroids and has been admitted for COPD exacerbation. EKG showed normal changes with chest x-ray showing nothing acute. Patient states that she also had diarrhea 5 episodes today morning but denies any nausea vomiting. During her last admission patient had a sonogram of the abdomen since patient complained of right upper quadrant pain which was unremarkable.  Review of Systems: As presented in the history of presenting illness, rest negative.  Past Medical History  Diagnosis Date  . Borderline diabetes   . Hypertension   . Anxiety   . Emphysema   . COPD (chronic obstructive pulmonary disease)   . OSA on CPAP     CPAP at night   . Dizziness     pt believes this is motion sickness or vertigo  . GERD (gastroesophageal reflux disease)   . Headache(784.0)   . Cancer 1990    cervical   . Anemia   . Morbid obesity   . Sickle cell trait   . Hx of cardiac catheterization     a. LHC at [facility] in [place], [place] 09/2008:  Normal coronary arteries EF 70%.  . Tobacco abuse     a. up to 3ppd from age 84 to 42, now 1/4 ppd (01/2013)  . Shortness of breath   . Pneumonia    Past Surgical History  Procedure Laterality Date  . Tubal ligation    . Cardiac  catheterization    . Colonoscopy  09/05/2012    Procedure: COLONOSCOPY;  Surgeon: Theda Belfast, MD;  Location: [facility];  Service: Endoscopy;  Laterality: N/A;   Social History:  reports that she has been smoking Cigarettes.  She has a 9 pack-year smoking history. She has never used smokeless tobacco. She reports that she does not drink alcohol or use illicit drugs. Home. where does patient live-- Can do ADLs. Can patient participate in ADLs?  No Known Allergies  Family History  Problem Relation Age of Onset  . Lung cancer Paternal Aunt   . Lung cancer Paternal Grandfather   . Other Father     unaware of father's medical history  . Diabetes Mother     alive @ 31  . Other      multiple siblings a&w.      Prior to Admission medications   Medication Sig Start Date End Date Taking? Authorizing Provider  albuterol (PROVENTIL HFA;VENTOLIN HFA) 108 (90 BASE) MCG/ACT inhaler Inhale 2 puffs into the lungs every 4 (four) hours as needed for wheezing. 03/11/13 03/11/14 Yes Leslye Peer, MD  albuterol (PROVENTIL) (2.5 MG/3ML) 0.083% nebulizer solution Take 3 mLs (2.5 mg total) by nebulization 4 (four) times daily. DX: 496. For wheezing 04/15/13 04/15/14 Yes Ripudeep Jenna Luo, MD  aspirin EC 81 MG tablet Take 81 mg by mouth daily.  Yes Historical Provider, MD  budesonide-formoterol (SYMBICORT) 160-4.5 MCG/ACT inhaler Inhale 2 puffs into the lungs 2 (two) times daily.   Yes Historical Provider, MD  cyclobenzaprine (FLEXERIL) 10 MG tablet Take 10 mg by mouth 2 (two) times daily as needed for muscle spasms.   Yes Historical Provider, MD  diazepam (VALIUM) 10 MG tablet Take 10 mg by mouth at bedtime.   Yes Historical Provider, MD  fluticasone (FLONASE) 50 MCG/ACT nasal spray Place 2 sprays into the nose 2 (two) times daily. 07/08/12 07/08/13 Yes Leslye Peer, MD  guaiFENesin (MUCINEX) 600 MG 12 hr tablet Take 1 tablet (600 mg total) by mouth 2 (two) times daily. 04/15/13  Yes Ripudeep K Rai, MD   ipratropium (ATROVENT) 0.02 % nebulizer solution Take 2.5 mLs (0.5 mg total) by nebulization 4 (four) times daily. 04/30/13  Yes Shanker Levora Dredge, MD  loratadine (CLARITIN) 10 MG tablet Take 10 mg by mouth daily.   Yes Historical Provider, MD  metFORMIN (GLUCOPHAGE) 500 MG tablet Take 1 tablet (500 mg total) by mouth daily with breakfast. 05/11/13  Yes Catarina Hartshorn, MD  metoprolol tartrate (LOPRESSOR) 12.5 mg TABS Take 0.5 tablets (12.5 mg total) by mouth 2 (two) times daily. 05/11/13  Yes Catarina Hartshorn, MD  omeprazole (PRILOSEC) 20 MG capsule Take 20 mg by mouth 2 (two) times daily.   Yes Historical Provider, MD  oxycodone (OXY-IR) 5 MG capsule Take 5 mg by mouth every 6 (six) hours as needed for pain.   Yes Historical Provider, MD  predniSONE (DELTASONE) 10 MG tablet Take 4 tablets (40 mg total) by mouth daily with breakfast. Start 05/12/13.  Decrease by 1 tablet every day. 05/11/13  Yes Catarina Hartshorn, MD  tiotropium (SPIRIVA) 18 MCG inhalation capsule Place 18 mcg into inhaler and inhale daily.   Yes Historical Provider, MD  vitamin B-12 (CYANOCOBALAMIN) 500 MCG tablet Take 500 mcg by mouth daily.   Yes Historical Provider, MD   Physical Exam: Filed Vitals:   05/13/13 2115 05/13/13 2154 05/13/13 2328 05/14/13 0004  BP: 136/61 129/88 157/85 121/61  Pulse: 107 103 107 102  Temp:      TempSrc:      Resp:  20 16 29   SpO2: 92% 95% 96% 97%     General:  Well-developed and nourished.  Eyes: Anicteric no pallor.  ENT: No discharge from ears eyes nose mouth.  Neck: No mass felt.  Cardiovascular: S1-S2 heard.  Respiratory: Mild expiratory wheeze heard bilaterally. No crepitations.  Abdomen: Soft nontender bowel sounds present.  Skin: No rash.  Musculoskeletal: No edema.  Psychiatric: Appears normal.  Neurologic: Alert awake oriented to time place and person. Moves all extremities.  Labs on Admission:  Basic Metabolic Panel:  Recent Labs Lab 05/09/13 0450 05/10/13 0542 05/11/13 0907  05/13/13 2016  NA 140 141 139 140  K 4.2 5.2* 4.7 4.6  CL 96 100 95* 98  CO2 33* 33* 31 31  GLUCOSE 125* 225* 259* 183*  BUN 24* 27* 28* 25*  CREATININE 0.82 0.84 0.82 0.92  CALCIUM 9.2 9.6 9.5 9.6   Liver Function Tests:  Recent Labs Lab 05/09/13 0450 05/10/13 0542  AST 149* 97*  ALT 284* 281*  ALKPHOS 74 79  BILITOT 0.3 0.3  PROT 7.2 7.5  ALBUMIN 3.6 3.6   No results found for this basename: LIPASE, AMYLASE,  in the last 168 hours No results found for this basename: AMMONIA,  in the last 168 hours CBC:  Recent Labs Lab 05/09/13 0450  05/13/13 2016  WBC 10.2 13.7*  NEUTROABS  --  11.5*  HGB 12.7 13.4  HCT 36.3 37.7  MCV 83.8 83.8  PLT 173 196   Cardiac Enzymes:  Recent Labs Lab 05/10/13 1810 05/10/13 2357 05/11/13 0910  TROPONINI <0.30 <0.30 <0.30    BNP (last 3 results)  Recent Labs  01/17/13 1410  PROBNP 103.2   CBG:  Recent Labs Lab 05/10/13 1055 05/10/13 1605 05/10/13 2109 05/11/13 0629 05/11/13 1102  GLUCAP 184* 459* 263* 121* 318*    Radiological Exams on Admission: Dg Chest Portable 1 View  05/13/2013   *RADIOLOGY REPORT*  Clinical Data: Shortness of breath and cough.  PORTABLE CHEST - 1 VIEW  Comparison: Plain films of the chest 05/08/2013.  Findings: The lungs are clear.  Heart size is normal.  No pneumothorax or pleural fluid.  Study is somewhat limited by the patient's body habitus and portable technique.  IMPRESSION: No acute abnormality.   Original Report Authenticated By: Holley Dexter, M.D.    EKG: Independently reviewed. Normal sinus rhythm with ST-T changes comparable with old EKG.  Assessment/Plan Principal Problem:   COPD exacerbation Active Problems:   Diabetes mellitus type 2, controlled   Hypertension   Obstructive sleep apnea   1. COPD exacerbation - continue IV steroids, nebulizer and Pulmicort with antibiotics. Check BNP and d-dimer. 2. Chest pain - cycle cardiac markers. Check d-dimer. 3. Diarrhea -  check stool for C. Difficile. 4. Recent admission showed elevated LFTs - recent sonogram of the abdomen did not show anything acute. Follow LFTs. 5. OSA - continue CPAP per respiratory. 6. Hypertension - recently taken off triamterene due to hyperkalemia. I have placed patient on when necessary IV hydralazine for systolic blood pressure more on 60. 7. Diabetes mellitus type 2 - closely follow CBGs as patient is on steroids. 8. Hoarseness - will need ENT referral eventually.    Code Status: Full code.  Family Communication: None.  Disposition Plan: Admit to inpatient.    Baylea Milburn N. [facility] Pager [phone].  If 7PM-7AM, please contact night-coverage [email or web address] Password [facility] 05/14/2013, 12:17 AM

## 2013-05-14 NOTE — Progress Notes (Signed)
I have seen and assessed patient and agree with Dr Katherene Ponto assessment and plan. Increase Solumedrol to 80mg  IVQ8. Change albuterol to xopenex. Add levaquin to azithromycin and follow. Flutter valve.

## 2013-05-14 NOTE — ED Provider Notes (Signed)
I saw and evaluated the patient, reviewed the resident's note and I agree with the findings and plan. If applicable, I agree with the resident's interpretation of the EKG.  If applicable, I was present for critical portions of any procedures performed.  SOB, cough, wheezing since hospital discharge for COPD exacerbation on 6/29. Mild distress, pursed lip breathing, expiratory wheezing bilaterally with rhonchi. Nebs, steroids, ABG, CXR. Doubt ACS or PE.  Glynn Octave, MD 05/14/13 [phone]

## 2013-05-15 DIAGNOSIS — R079 Chest pain, unspecified: Secondary | ICD-10-CM

## 2013-05-15 LAB — CBC
Hemoglobin: 12.6 g/dL (ref 12.0–15.0)
MCH: 29.5 pg (ref 26.0–34.0)
MCHC: 34.8 g/dL (ref 30.0–36.0)
Platelets: 226 10*3/uL (ref 150–400)

## 2013-05-15 LAB — GLUCOSE, CAPILLARY
Glucose-Capillary: 294 mg/dL — ABNORMAL HIGH (ref 70–99)
Glucose-Capillary: 204 mg/dL — ABNORMAL HIGH (ref 70–99)

## 2013-05-15 LAB — BASIC METABOLIC PANEL
BUN: 29 mg/dL — ABNORMAL HIGH (ref 6–23)
Calcium: 9.6 mg/dL (ref 8.4–10.5)
GFR calc non Af Amer: >90 mL/min (ref >90)
Glucose, Bld: 175 mg/dL — ABNORMAL HIGH (ref 70–99)

## 2013-05-15 MED ORDER — AMLODIPINE BESYLATE 5 MG PO TABS: 5.0000 mg | ORAL_TABLET | Freq: Every day | ORAL | Status: DC

## 2013-05-15 MED ORDER — NICOTINE 21 MG/24HR TD PT24
21.0000 mg | MEDICATED_PATCH | Freq: Every day | TRANSDERMAL | Status: DC
  Administered 2013-05-15 – 2013-05-17 (×3): 21 mg via TRANSDERMAL
  Filled 2013-05-15 (×3): qty 1

## 2013-05-15 MED ORDER — INSULIN GLARGINE 100 UNIT/ML ~~LOC~~ SOLN
5.0000 [IU] | Freq: Every day | SUBCUTANEOUS | Status: DC
  Administered 2013-05-15: 5 [IU] via SUBCUTANEOUS
  Filled 2013-05-15: qty 0.05

## 2013-05-15 MED ORDER — AMLODIPINE BESYLATE 10 MG PO TABS
10.0000 mg | ORAL_TABLET | Freq: Every day | ORAL | Status: DC
  Administered 2013-05-15 – 2013-05-16 (×2): 10 mg via ORAL
  Filled 2013-05-15 (×4): qty 1

## 2013-05-15 MED ORDER — INSULIN ASPART 100 UNIT/ML ~~LOC~~ SOLN
0.0000 [IU] | Freq: Three times a day (TID) | SUBCUTANEOUS | Status: DC
  Administered 2013-05-16: 20 [IU] via SUBCUTANEOUS
  Administered 2013-05-16: 15 [IU] via SUBCUTANEOUS
  Administered 2013-05-16: 7 [IU] via SUBCUTANEOUS
  Administered 2013-05-17: 20 [IU] via SUBCUTANEOUS
  Administered 2013-05-17: 11 [IU] via SUBCUTANEOUS
  Administered 2013-05-17: 4 [IU] via SUBCUTANEOUS

## 2013-05-15 MED ORDER — INSULIN ASPART 100 UNIT/ML ~~LOC~~ SOLN
11.0000 [IU] | Freq: Once | SUBCUTANEOUS | Status: AC
  Administered 2013-05-15: 11 [IU] via SUBCUTANEOUS

## 2013-05-15 NOTE — Progress Notes (Signed)
Pt given exit care note handouts on diabetes management, dietary consult placed, pt stated she wants any free resource available to help better manager her diabetes, pt also given information about how to watch diabetes videos while in the hospital

## 2013-05-15 NOTE — Plan of Care (Signed)
Problem: Problem: Diabetes Management Progression Goal: INCREASE DIABETES KNOWLEDGE Outcome: Progressing Pt given handouts on diabetes, dietary consult put in for pt per nursing, pt stated she wants to make changes regarding her health, education given to pt

## 2013-05-15 NOTE — Progress Notes (Signed)
Pt. Removed telemetry leads numerous times during the night. RN in pts. Room to explain to patient the importance of keeping the leads on . Pt. Verbalized understanding. Pt. Showing signs of confusion. Up and dressed with leads off stating she was getting ready to go home. RN reoriented pt. And reapplied telemetry monitor. Pt. Stated she forgot where she was and stated she was going to stay in the hospital until she was better. VSS. Pt. Resting in bed with call light within reach. RN will continue to monitor pt. For changes in condition. Prisma Decarlo, Cheryll Dessert

## 2013-05-15 NOTE — Progress Notes (Signed)
Pt. Refused cpap for tonight. Pt. States she is not going to wear cpap while she is in the hospital.

## 2013-05-15 NOTE — Plan of Care (Signed)
Problem: Food- and Nutrition-Related Knowledge Deficit (NB-1.1) Goal: Nutrition education Formal process to instruct or train a patient/client in a skill or to impart knowledge to help patients/clients voluntarily manage or modify food choices and eating behavior to maintain or improve health. Outcome: Completed/Met Date Met:  05/15/13  RD consulted for nutrition education regarding diabetes.     Lab Results  Component Value Date    HGBA1C 6.9* 01/20/2013    RD provided "Carbohydrate Counting for People with Diabetes" handout from the Academy of [employer]. Discussed different food groups and their effects on blood sugar, emphasizing carbohydrate-containing foods. Provided list of carbohydrates and recommended serving sizes of common foods.  Discussed importance of controlled and consistent carbohydrate intake throughout the day. Provided examples of ways to balance meals/snacks and encouraged intake of high-fiber, whole grain complex carbohydrates. Teach back method used.  Expect poor compliance.  Body mass index is 43.79 kg/(m^2). Pt meets criteria for Obese Class III based on current BMI.  Current diet order is Carbohydrate Modified Medium, patient is consuming approximately 100% of meals at this time. Labs and medications reviewed. No further nutrition interventions warranted at this time. RD contact information provided. If additional nutrition issues arise, please re-consult RD.  Jarold Motto MS, RD, LDN Pager: [phone] After-hours pager: [phone]

## 2013-05-15 NOTE — Progress Notes (Signed)
[facility] PROGRESS NOTE  Stephanie Schultz WUJ:[id number] DOB: [birth year] DOA: 05/13/2013 PCP: Lonia Blood, MD  Assessment/Plan: #1. Acute COPD exac Slowly improving clinically. Continue IV levaquin, azithromycin, IV steriods, mucinex, nebs, oxygen. Follow.   #2 chest pain Likely secondary to problem #1. Cardiac enzymes negative x3. D. Dimer negative. Patient with recent 2-D echo 3/ 2014 with EF of 65-70% with no wall motion abnormalities.EKG is unchanged. No need for any further workup cardiac wise at this time.  #3 hypertension Stable. D/C metoprolol for now secondary to problem #1.Will start Norvasc 10 mg daily.  #4 obstructive sleep apnea CPAP each bedtime.  #5 type 2 diabetes CBGs ranging from 185 - 305. Hemoglobin A1c was 6.9 on 01/20/2013. Diabetes education. CBGs elevated likely secondary to steroids. Will place on Lantus 5 units daily.  Change Sliding scale insulin to resistant.  #6 hoarseness Will need to followup with ENT as outpatient.  #7 prophylaxis PPI for GI prophylaxis.Lovenox for DVT prophylaxis.    Code Status: Full Family Communication: Updated patient at bedside. Disposition Plan: Home when medically stable.   Consultants:  None  Procedures:  CXR 05/13/13  Antibiotics:  IV Azithromycin 05/14/13  IV Levaquin 05/14/13   HPI/Subjective: Patient states feeling a little better  Objective: Filed Vitals:   05/15/13 0129 05/15/13 0514 05/15/13 0917 05/15/13 1352  BP:  136/84  156/77  Pulse:  104  103  Temp:  97.6 F (36.4 C)  98.3 F (36.8 C)  TempSrc:  Oral  Oral  Resp:  20  22  Height:      Weight:  101.696 kg (224 lb 3.2 oz)    SpO2: 100% 100% 100% 98%    Intake/Output Summary (Last 24 hours) at 05/15/13 1450 Last data filed at 05/15/13 1313  Gross per 24 hour  Intake   1380 ml  Output    250 ml  Net   1130 ml   Filed Weights   05/14/13 0105 05/15/13 0514  Weight: 102.059 kg (225 lb) 101.696 kg (224 lb 3.2 oz)     Exam:   General:  NAD  Cardiovascular: RRR  Respiratory: Expiratory wheezes. Some coarse BS.  Abdomen: Soft/NT/ND/+BS  Extremities: No c/c/e  Data Reviewed: Basic Metabolic Panel:  Recent Labs Lab 05/10/13 0542 05/11/13 0907 05/13/13 2016 05/14/13 0854 05/15/13 0500  NA 141 139 140 139 140  K 5.2* 4.7 4.6 4.6 4.9  CL 100 95* 98 98 100  CO2 33* 31 31 28  32  GLUCOSE 225* 259* 183* 264* 175*  BUN 27* 28* 25* 26* 29*  CREATININE 0.84 0.82 0.92 0.77 0.76  CALCIUM 9.6 9.5 9.6 9.8 9.6   Liver Function Tests:  Recent Labs Lab 05/09/13 0450 05/10/13 0542 05/14/13 0854  AST 149* 97* 57*  ALT 284* 281* 187*  ALKPHOS 74 79 71  BILITOT 0.3 0.3 0.3  PROT 7.2 7.5 7.6  ALBUMIN 3.6 3.6 3.6   No results found for this basename: LIPASE, AMYLASE,  in the last 168 hours No results found for this basename: AMMONIA,  in the last 168 hours CBC:  Recent Labs Lab 05/09/13 0450 05/13/13 2016 05/14/13 0854 05/15/13 0500  WBC 10.2 13.7* 17.2* 17.2*  NEUTROABS  --  11.5* 15.4*  --   HGB 12.7 13.4 12.5 12.6  HCT 36.3 37.7 35.9* 36.2  MCV 83.8 83.8 84.1 84.8  PLT 173 196 213 226   Cardiac Enzymes:  Recent Labs Lab 05/10/13 2357 05/11/13 0910 05/14/13 0230 05/14/13 0854 05/14/13 2004  TROPONINI <0.30 <0.30 <  0.30 <0.30 <0.30   BNP (last 3 results)  Recent Labs  01/17/13 1410 05/14/13 0230  PROBNP 103.2 46.2   CBG:  Recent Labs Lab 05/14/13 1057 05/14/13 1635 05/14/13 2255 05/15/13 0707 05/15/13 1045  GLUCAP 215* 350* 236* 185* 305*    Recent Results (from the past 240 hour(s))  CLOSTRIDIUM DIFFICILE BY PCR     Status: None   Collection Time    05/14/13  8:42 AM      Result Value Range Status   C difficile by pcr NEGATIVE  NEGATIVE Final     Studies: Dg Chest Portable 1 View  05/13/2013   *RADIOLOGY REPORT*  Clinical Data: Shortness of breath and cough.  PORTABLE CHEST - 1 VIEW  Comparison: Plain films of the chest 05/08/2013.  Findings: The  lungs are clear.  Heart size is normal.  No pneumothorax or pleural fluid.  Study is somewhat limited by the patient's body habitus and portable technique.  IMPRESSION: No acute abnormality.   Original Report Authenticated By: Holley Dexter, M.D.    Scheduled Meds: . aspirin EC  325 mg Oral Daily  . azithromycin  500 mg Intravenous Q24H  . budesonide (PULMICORT) nebulizer solution  0.25 mg Nebulization BID  . enoxaparin (LOVENOX) injection  40 mg Subcutaneous Q24H  . fluticasone  2 spray Each Nare Daily  . guaiFENesin  1,200 mg Oral BID  . insulin aspart  0-9 Units Subcutaneous TID WC  . ipratropium  0.5 mg Nebulization Q6H  . levalbuterol  0.63 mg Nebulization Q6H  . levofloxacin (LEVAQUIN) IV  500 mg Intravenous Q24H  . loratadine  10 mg Oral Daily  . methylPREDNISolone (SOLU-MEDROL) injection  80 mg Intravenous Q8H  . metoprolol tartrate  12.5 mg Oral BID  . nicotine  21 mg Transdermal Daily  . pantoprazole  40 mg Oral Daily  . sodium chloride  3 mL Intravenous Q12H  . vitamin B-12  500 mcg Oral Daily   Continuous Infusions:   Principal Problem:   COPD exacerbation Active Problems:   Diabetes mellitus type 2, controlled   Hypertension   Obstructive sleep apnea    Time spent: > 35 mins    [facility]  [facility] Pager [phone]. If 7PM-7AM, please contact night-coverage at [email or web address], password [facility] 05/15/2013, 2:50 PM  LOS: 2 days

## 2013-05-16 LAB — GLUCOSE, CAPILLARY
Glucose-Capillary: 354 mg/dL — ABNORMAL HIGH (ref 70–99)
Glucose-Capillary: 372 mg/dL — ABNORMAL HIGH (ref 70–99)
Glucose-Capillary: 263 mg/dL — ABNORMAL HIGH (ref 70–99)

## 2013-05-16 LAB — URINALYSIS, ROUTINE W REFLEX MICROSCOPIC
Glucose, UA: 500 mg/dL — AB
Hgb urine dipstick: NEGATIVE
Leukocytes, UA: NEGATIVE
Specific Gravity, Urine: 1.028 (ref 1.005–1.030)
Urobilinogen, UA: 1 mg/dL (ref 0.0–1.0)

## 2013-05-16 LAB — CBC
HCT: 35.9 % — ABNORMAL LOW (ref 36.0–46.0)
Platelets: 221 10*3/uL (ref 150–400)
RDW: 15.5 % (ref 11.5–15.5)
WBC: 14 10*3/uL — ABNORMAL HIGH (ref 4.0–10.5)

## 2013-05-16 LAB — BASIC METABOLIC PANEL
Chloride: 96 mEq/L (ref 96–112)
Creatinine, Ser: 0.84 mg/dL (ref 0.50–1.10)
GFR calc Af Amer: 90 mL/min — ABNORMAL LOW (ref >90)
GFR calc non Af Amer: 77 mL/min — ABNORMAL LOW (ref >90)

## 2013-05-16 MED ORDER — INSULIN GLARGINE 100 UNIT/ML ~~LOC~~ SOLN
15.0000 [IU] | Freq: Every day | SUBCUTANEOUS | Status: DC
  Administered 2013-05-16: 15 [IU] via SUBCUTANEOUS
  Filled 2013-05-16: qty 0.15

## 2013-05-16 MED ORDER — LEVOFLOXACIN 500 MG PO TABS
500.0000 mg | ORAL_TABLET | ORAL | Status: DC
  Administered 2013-05-16 – 2013-05-17 (×2): 500 mg via ORAL
  Filled 2013-05-16 (×2): qty 1

## 2013-05-16 MED ORDER — INSULIN GLARGINE 100 UNIT/ML ~~LOC~~ SOLN
5.0000 [IU] | Freq: Once | SUBCUTANEOUS | Status: AC
  Administered 2013-05-16: 5 [IU] via SUBCUTANEOUS
  Filled 2013-05-16: qty 0.05

## 2013-05-16 MED ORDER — INSULIN GLARGINE 100 UNIT/ML ~~LOC~~ SOLN
10.0000 [IU] | Freq: Every day | SUBCUTANEOUS | Status: DC
  Filled 2013-05-16: qty 0.1

## 2013-05-16 MED ORDER — LORAZEPAM 0.5 MG PO TABS
0.5000 mg | ORAL_TABLET | Freq: Three times a day (TID) | ORAL | Status: DC | PRN
  Administered 2013-05-16 (×2): 0.5 mg via ORAL
  Filled 2013-05-16 (×2): qty 1

## 2013-05-16 MED ORDER — METHYLPREDNISOLONE SODIUM SUCC 125 MG IJ SOLR
80.0000 mg | Freq: Two times a day (BID) | INTRAMUSCULAR | Status: DC
  Administered 2013-05-17 (×2): 80 mg via INTRAVENOUS
  Filled 2013-05-16 (×3): qty 1.28

## 2013-05-16 NOTE — Progress Notes (Signed)
[facility] PROGRESS NOTE  Stephanie Schultz ZOX:[id number] DOB: 1956/02/10 DOA: 05/13/2013 PCP: Lonia Blood, MD  Assessment/Plan: #1. Acute COPD exac Slowly improving clinically. DC IV azithromycin. Change IV Levaquin to oral Levaquin. IV steriod taper. Continue mucinex, nebs, oxygen. Follow.   #2 chest pain Likely secondary to problem #1. No further chest pain. Cardiac enzymes negative x3. D. Dimer negative. Patient with recent 2-D echo 3/ 2014 with EF of 65-70% with no wall motion abnormalities.EKG is unchanged. No need for any further workup cardiac wise at this time.  #3 hypertension Stable. D/C'd metoprolol secondary to problem #1. Continue Norvasc 10 mg daily.  #4 obstructive sleep apnea CPAP each bedtime.  #5 type 2 diabetes CBGs ranging from 204 - 372. Hemoglobin A1c was 6.9 on 01/20/2013. Diabetes education. CBGs elevated likely secondary to steroids. Will increase Lantus 15 units daily.  Continue resistant Sliding scale insulin.  #6 hoarseness Will need to followup with ENT as outpatient.  #7 prophylaxis PPI for GI prophylaxis.Lovenox for DVT prophylaxis.    Code Status: Full Family Communication: Updated patient at bedside. Disposition Plan: Home when medically stable.   Consultants:  None  Procedures:  CXR 05/13/13  Antibiotics:  IV Azithromycin 05/14/13--->05/16/2013  IV Levaquin 05/14/13 ---> 05/16/2013  Oral Levaquin 05/16/2013  HPI/Subjective: Patient states feeling better.  Objective: Filed Vitals:   05/16/13 0130 05/16/13 0437 05/16/13 0639 05/16/13 1100  BP:  127/64  151/77  Pulse:  113  108  Temp:  97.6 F (36.4 C)    TempSrc:  Oral    Resp:  20    Height:      Weight:  102.15 kg (225 lb 3.2 oz)    SpO2: 98% 99% 96%     Intake/Output Summary (Last 24 hours) at 05/16/13 1405 Last data filed at 05/16/13 0920  Gross per 24 hour  Intake    946 ml  Output    675 ml  Net    271 ml   Filed Weights   05/14/13 0105 05/15/13 0514 05/16/13  0437  Weight: 102.059 kg (225 lb) 101.696 kg (224 lb 3.2 oz) 102.15 kg (225 lb 3.2 oz)    Exam:   General:  NAD  Cardiovascular: RRR  Respiratory: Expiratory wheezes. Some coarse BS.  Abdomen: Soft/NT/ND/+BS  Extremities: No c/c/e  Data Reviewed: Basic Metabolic Panel:  Recent Labs Lab 05/11/13 0907 05/13/13 2016 05/14/13 0854 05/15/13 0500 05/16/13 0620  NA 139 140 139 140 138  K 4.7 4.6 4.6 4.9 4.7  CL 95* 98 98 100 96  CO2 31 31 28  32 29  GLUCOSE 259* 183* 264* 175* 372*  BUN 28* 25* 26* 29* 29*  CREATININE 0.82 0.92 0.77 0.76 0.84  CALCIUM 9.5 9.6 9.8 9.6 9.5   Liver Function Tests:  Recent Labs Lab 05/10/13 0542 05/14/13 0854  AST 97* 57*  ALT 281* 187*  ALKPHOS 79 71  BILITOT 0.3 0.3  PROT 7.5 7.6  ALBUMIN 3.6 3.6   No results found for this basename: LIPASE, AMYLASE,  in the last 168 hours No results found for this basename: AMMONIA,  in the last 168 hours CBC:  Recent Labs Lab 05/13/13 2016 05/14/13 0854 05/15/13 0500 05/16/13 0620  WBC 13.7* 17.2* 17.2* 14.0*  NEUTROABS 11.5* 15.4*  --   --   HGB 13.4 12.5 12.6 12.4  HCT 37.7 35.9* 36.2 35.9*  MCV 83.8 84.1 84.8 84.7  PLT 196 213 226 221   Cardiac Enzymes:  Recent Labs Lab 05/10/13 2357 05/11/13 0910 05/14/13  0230 05/14/13 0854 05/14/13 2004  TROPONINI <0.30 <0.30 <0.30 <0.30 <0.30   BNP (last 3 results)  Recent Labs  01/17/13 1410 05/14/13 0230  PROBNP 103.2 46.2   CBG:  Recent Labs Lab 05/15/13 1045 05/15/13 1615 05/15/13 2026 05/16/13 0628 05/16/13 1151  GLUCAP 305* 294* 204* 354* 372*    Recent Results (from the past 240 hour(s))  CLOSTRIDIUM DIFFICILE BY PCR     Status: None   Collection Time    05/14/13  8:42 AM      Result Value Range Status   C difficile by pcr NEGATIVE  NEGATIVE Final     Studies: No results found.  Scheduled Meds: . amLODipine  10 mg Oral Daily  . aspirin EC  325 mg Oral Daily  . budesonide (PULMICORT) nebulizer solution   0.25 mg Nebulization BID  . enoxaparin (LOVENOX) injection  40 mg Subcutaneous Q24H  . fluticasone  2 spray Each Nare Daily  . guaiFENesin  1,200 mg Oral BID  . insulin aspart  0-20 Units Subcutaneous TID WC  . insulin glargine  10 Units Subcutaneous QHS  . ipratropium  0.5 mg Nebulization Q6H  . levalbuterol  0.63 mg Nebulization Q6H  . levofloxacin  500 mg Oral Q24H  . loratadine  10 mg Oral Daily  . methylPREDNISolone (SOLU-MEDROL) injection  80 mg Intravenous Q8H  . nicotine  21 mg Transdermal Daily  . pantoprazole  40 mg Oral Daily  . sodium chloride  3 mL Intravenous Q12H  . vitamin B-12  500 mcg Oral Daily   Continuous Infusions:   Principal Problem:   COPD exacerbation Active Problems:   Diabetes mellitus type 2, controlled   Hypertension   Obstructive sleep apnea   GERD (gastroesophageal reflux disease)   Chest pain    Time spent: > 35 mins    [facility]  [facility] Pager [phone]. If 7PM-7AM, please contact night-coverage at [email or web address], password [facility] 05/16/2013, 2:05 PM  LOS: 3 days

## 2013-05-16 NOTE — Progress Notes (Signed)
Client is resting quieter since being given the Ativan.  Respiratory exchange even and slightly labored.  Head of bed elevated at 30 degrees.

## 2013-05-17 LAB — BASIC METABOLIC PANEL
BUN: 26 mg/dL — ABNORMAL HIGH (ref 6–23)
Calcium: 9 mg/dL (ref 8.4–10.5)
Creatinine, Ser: 0.72 mg/dL (ref 0.50–1.10)
GFR calc non Af Amer: >90 mL/min (ref >90)
Glucose, Bld: 340 mg/dL — ABNORMAL HIGH (ref 70–99)
Sodium: 138 mEq/L (ref 135–145)

## 2013-05-17 LAB — CBC
MCH: 29.3 pg (ref 26.0–34.0)
MCHC: 34.2 g/dL (ref 30.0–36.0)
Platelets: 209 10*3/uL (ref 150–400)
RDW: 15.8 % — ABNORMAL HIGH (ref 11.5–15.5)

## 2013-05-17 LAB — GLUCOSE, CAPILLARY: Glucose-Capillary: 183 mg/dL — ABNORMAL HIGH (ref 70–99)

## 2013-05-17 LAB — URINE CULTURE

## 2013-05-17 MED ORDER — INSULIN GLARGINE 100 UNIT/ML ~~LOC~~ SOLN
20.0000 [IU] | Freq: Every day | SUBCUTANEOUS | Status: DC
  Filled 2013-05-17: qty 0.2

## 2013-05-17 MED ORDER — METOPROLOL TARTRATE 12.5 MG HALF TABLET
12.5000 mg | ORAL_TABLET | Freq: Two times a day (BID) | ORAL | Status: DC
  Administered 2013-05-17: 12.5 mg via ORAL
  Filled 2013-05-17 (×2): qty 1

## 2013-05-17 NOTE — Progress Notes (Addendum)
Client is disoriented this morning.  This behavior noted by night nurse after being given Ativan.  MD made aware and Ativan discontinued.  Up in chair eating breakfast.    1456 More alert at this time.  Sitting on side of bed with Oxygen per order.  1816 Client has been on phone with daughter and now wants to go home.  States, " My daughter and I have been fighting but it better now." " She also says that she will call a cab to go home."    Md was notified and advised, he states that she can go AMA,.  Explained to her the issue and she now still wants to go home.  Very aggressive verbally.  She now will not sign AMA paper unless we replace $350.00 dollars she says is missing.  It is noted that client was in a yellow shiny wallet earlier today and had a bill in her hand, she showed me a twenty dollar bill and says someone took the other money.  She has been advised that it is not in her favor to leave without completing treatment, but, is insistent on leaving. SOB on exertion.   IV and monitor has been discontinued.   [id number]  Security has been called to address the issue related to the claim that money is missing.   The patient in 30 has given her an oversized top and new panties to wear home.  This was not requested, but, client in [phone] overheard [place] trying to find something for her to wear out.    She has home Oxygen that was brought to unit since her transport tank was noted empty by myself.  [facility] supplied the tank. The small transport tank was put in her grey and black bag. Along with black top, navy blue top.  Client has not been out of room other than her brief walk outside of door.   16 Security now on unit to talk with patient. Nida Boatman  Joni Reining her daughter called me from [place] and says that all her daughters are there.  They are concerned that she is texting "crazy stuff".  Client however is aware that she is in the hospital, knows her name, birth date, and what the day is.   Daughters feel that she is wanting out so she can smoke.   Joni Reining also says that there has been no disagreement between them.    Client signed AMA paper, but, remains in room.  Security was present during the signing of paper.  Tearful.  Now thinks $20.00 is missing, but, will not look in the wallet for it.  Sitting on side of bed with home oxygen in use.  Security watched me close black bag and place yellow shiny wallet in it. 1851 She now says that a home health aid is coming to get her.  Client has brown rim glasses with her. 1946 Security here to escort client off unit.  Remains alert and verbally aggressive.  Voided prior to leaving.

## 2013-05-17 NOTE — Care Management Note (Signed)
Cm by bedside RN concerning pt's empty portable oxygen tank. [facility] dme liason Jermaine notified at [phone]. No other needs identified at this time.   Leonie Green [phone]

## 2013-05-17 NOTE — Progress Notes (Signed)
[facility] PROGRESS NOTE  Stephanie Schultz ZOX:[id number] DOB: 12/24/1955 DOA: 05/13/2013 PCP: Lonia Blood, MD  Assessment/Plan: #1. Acute COPD exac Slowly improving clinically. Continue oxygen, nebs, Mucinex, Levaquin, steroid taper.    #2 chest pain Likely secondary to problem #1. No further chest pain. Cardiac enzymes negative x3. D. Dimer negative. Patient with recent 2-D echo 3/ 2014 with EF of 65-70% with no wall motion abnormalities.EKG is unchanged. No need for any further workup cardiac wise at this time.  #3 hypertension Stable. Resume metoprolol. Discontinue Norvasc.   #4 obstructive sleep apnea CPAP each bedtime.  #5 type 2 diabetes CBGs ranging from 242 - 383. Hemoglobin A1c was 6.9 on 01/20/2013. Diabetes education. CBGs elevated likely secondary to steroids. Will increase Lantus 20 units daily.  Continue resistant Sliding scale insulin.  #6 hoarseness Will need to followup with ENT as outpatient.  #7 prophylaxis PPI for GI prophylaxis.Lovenox for DVT prophylaxis.    Code Status: Full Family Communication: Updated patient at bedside. Disposition Plan: Home when medically stable.   Consultants:  None  Procedures:  CXR 05/13/13  Antibiotics:  IV Azithromycin 05/14/13--->05/16/2013  IV Levaquin 05/14/13 ---> 05/16/2013  Oral Levaquin 05/16/2013  HPI/Subjective: Patient states feeling better. Per nursing patient with some confusion after ativan.  Objective: Filed Vitals:   05/17/13 0158 05/17/13 0635 05/17/13 0729 05/17/13 1012  BP:  164/81  154/72  Pulse:  98  107  Temp:  97.9 F (36.6 C)    TempSrc:  Oral    Resp:  18    Height:      Weight:  103.42 kg (228 lb)    SpO2: 97% 98% 97%     Intake/Output Summary (Last 24 hours) at 05/17/13 1311 Last data filed at 05/17/13 1100  Gross per 24 hour  Intake   1090 ml  Output    925 ml  Net    165 ml   Filed Weights   05/15/13 0514 05/16/13 0437 05/17/13 0635  Weight: 101.696 kg (224 lb 3.2 oz)  102.15 kg (225 lb 3.2 oz) 103.42 kg (228 lb)    Exam:   General:  NAD  Cardiovascular: RRR  Respiratory: Min to mild wheezes. Some coarse BS.  Abdomen: Soft/NT/ND/+BS  Extremities: No c/c/e  Data Reviewed: Basic Metabolic Panel:  Recent Labs Lab 05/13/13 2016 05/14/13 0854 05/15/13 0500 05/16/13 0620 05/17/13 0615  NA 140 139 140 138 138  K 4.6 4.6 4.9 4.7 4.6  CL 98 98 100 96 97  CO2 31 28 32 29 32  GLUCOSE 183* 264* 175* 372* 340*  BUN 25* 26* 29* 29* 26*  CREATININE 0.92 0.77 0.76 0.84 0.72  CALCIUM 9.6 9.8 9.6 9.5 9.0   Liver Function Tests:  Recent Labs Lab 05/14/13 0854  AST 57*  ALT 187*  ALKPHOS 71  BILITOT 0.3  PROT 7.6  ALBUMIN 3.6   No results found for this basename: LIPASE, AMYLASE,  in the last 168 hours No results found for this basename: AMMONIA,  in the last 168 hours CBC:  Recent Labs Lab 05/13/13 2016 05/14/13 0854 05/15/13 0500 05/16/13 0620 05/17/13 0615  WBC 13.7* 17.2* 17.2* 14.0* 10.7*  NEUTROABS 11.5* 15.4*  --   --   --   HGB 13.4 12.5 12.6 12.4 12.2  HCT 37.7 35.9* 36.2 35.9* 35.7*  MCV 83.8 84.1 84.8 84.7 85.6  PLT 196 213 226 221 209   Cardiac Enzymes:  Recent Labs Lab 05/10/13 2357 05/11/13 0910 05/14/13 0230 05/14/13 0854 05/14/13 2004  TROPONINI <0.30 <0.30 <0.30 <0.30 <0.30   BNP (last 3 results)  Recent Labs  01/17/13 1410 05/14/13 0230  PROBNP 103.2 46.2   CBG:  Recent Labs Lab 05/16/13 1151 05/16/13 1633 05/16/13 2105 05/17/13 0614 05/17/13 1139  GLUCAP 372* 215* 263* 383* 242*    Recent Results (from the past 240 hour(s))  CLOSTRIDIUM DIFFICILE BY PCR     Status: None   Collection Time    05/14/13  8:42 AM      Result Value Range Status   C difficile by pcr NEGATIVE  NEGATIVE Final     Studies: No results found.  Scheduled Meds: . aspirin EC  325 mg Oral Daily  . budesonide (PULMICORT) nebulizer solution  0.25 mg Nebulization BID  . enoxaparin (LOVENOX) injection  40 mg  Subcutaneous Q24H  . fluticasone  2 spray Each Nare Daily  . guaiFENesin  1,200 mg Oral BID  . insulin aspart  0-20 Units Subcutaneous TID WC  . insulin glargine  20 Units Subcutaneous QHS  . ipratropium  0.5 mg Nebulization Q6H  . levalbuterol  0.63 mg Nebulization Q6H  . levofloxacin  500 mg Oral Q24H  . loratadine  10 mg Oral Daily  . methylPREDNISolone (SOLU-MEDROL) injection  80 mg Intravenous Q12H  . metoprolol tartrate  12.5 mg Oral BID  . nicotine  21 mg Transdermal Daily  . pantoprazole  40 mg Oral Daily  . sodium chloride  3 mL Intravenous Q12H  . vitamin B-12  500 mcg Oral Daily   Continuous Infusions:   Principal Problem:   COPD exacerbation Active Problems:   Diabetes mellitus type 2, controlled   Hypertension   Obstructive sleep apnea   GERD (gastroesophageal reflux disease)   Chest pain    Time spent: > 35 mins    [facility]  [facility] Pager [phone]. If 7PM-7AM, please contact night-coverage at [email or web address], password [facility] 05/17/2013, 1:11 PM  LOS: 4 days

## 2013-05-18 NOTE — Discharge Summary (Signed)
Physician Discharge Summary  Stephanie Schultz ZOX:[id number] DOB: May 24, 1956 DOA: 05/13/2013  PCP: Lonia Blood, MD  Admit date: 05/13/2013 Discharge date: 05/17/2013    Patient left AGAINST MEDICAL ADVICE Time spent: 60 minutes  Recommendations for Outpatient Follow-up:  1. Patient left AGAINST MEDICAL ADVICE. Hopefully patient will followup with her PCP.  Discharge Diagnoses:  Principal Problem:   COPD exacerbation Active Problems:   Diabetes mellitus type 2, controlled   Hypertension   Obstructive sleep apnea   GERD (gastroesophageal reflux disease)   Chest pain   Discharge Condition: Patient left AGAINST MEDICAL ADVICE  Diet recommendation: Patient left AGAINST MEDICAL ADVICE  Filed Weights   05/15/13 0514 05/16/13 0437 05/17/13 8119  Weight: 101.696 kg (224 lb 3.2 oz) 102.15 kg (225 lb 3.2 oz) 103.42 kg (228 lb)    History of present illness:  Stephanie Schultz is a 57 y.o. female with known history of COPD and ongoing tobacco abuse who was recently admitted for COPD exacerbation presents to the ER because of persistent shortness of breath. Patient states that after her discharge she was better but her symptoms started to recur. She was short of breath at rest also increased on exertion. Had productive cough denies any fever chills. In addition patient also has been having some chest pressure off and on. In the ER patient was found to have wheezing bilaterally and was placed on nebulizer and steroids and has been admitted for COPD exacerbation. EKG showed normal changes with chest x-ray showing nothing acute. Patient states that she also had diarrhea 5 episodes today morning but denies any nausea vomiting. During her last admission patient had a sonogram of the abdomen since patient complained of right upper quadrant pain which was unremarkable.      Hospital Course:  #1. Acute COPD exac  Patient was admitted with shortness of breath bilateral wheezing and felt to be in an acute COPD  exacerbation. Patient was not on oxygen, nebulizer treatments, IV steroids, IV antibiotics, Mucinex and follow. Patient was improving clinically but slowly during the hospitalization. Patient on the day of discharge and wanted to go and smoke a cigarette was told she could not do that and a such patient left AGAINST MEDICAL ADVICE. #2 chest pain  Likely secondary to problem #1. No further chest pain. Cardiac enzymes negative x3. D. Dimer negative. Patient with recent 2-D echo 3/ 2014 with EF of 65-70% with no wall motion abnormalities.EKG is unchanged. No need for any further workup cardiac wise at this time.  #3 hypertension  Stable. Resumed on home dose metoprolol.   #4 obstructive sleep apnea  CPAP each bedtime.  #5 type 2 diabetes  CBGs ranging from 242 - 383. Hemoglobin A1c was 6.9 on 01/20/2013. Diabetes education. CBGs elevated likely secondary to steroids. Due to elevated CBGs likely secondary to steroids patient's Lantus dose was increased to 20 units daily. Patient was subsequently placed on a resistant sliding scale insulin.  #6 hoarseness  Will need to followup with ENT as outpatient.   Procedures:  Chest x-ray 05/13/2013  Consultations:  None   Discharge Exam: Filed Vitals:   05/17/13 0729 05/17/13 1012 05/17/13 1329 05/17/13 1500  BP:  154/72  150/88  Pulse:  107  92  Temp:    98 F (36.7 C)  TempSrc:    Oral  Resp:    20  Height:      Weight:      SpO2: 97%  98% 100%    General: NAD Cardiovascular: RRR Respiratory: Minimal  to mild wheezes. Some coarse breath sounds.  Discharge Instructions   Future Appointments Provider Department Dept Phone   05/22/2013 4:15 PM Leslye Peer, MD [facility] [phone]   05/26/2013 9:00 AM Leslye Peer, MD [facility] [phone]       Medication List    ASK your doctor about these medications       albuterol 108 (90 BASE) MCG/ACT inhaler  Commonly known as:  PROVENTIL HFA;VENTOLIN HFA   Inhale 2 puffs into the lungs every 4 (four) hours as needed for wheezing.     albuterol (2.5 MG/3ML) 0.083% nebulizer solution  Commonly known as:  PROVENTIL  - Take 3 mLs (2.5 mg total) by nebulization 4 (four) times daily. DX: 496.  - For wheezing     aspirin EC 81 MG tablet  Take 81 mg by mouth daily.     budesonide-formoterol 160-4.5 MCG/ACT inhaler  Commonly known as:  SYMBICORT  Inhale 2 puffs into the lungs 2 (two) times daily.     cyclobenzaprine 10 MG tablet  Commonly known as:  FLEXERIL  Take 10 mg by mouth 2 (two) times daily as needed for muscle spasms.     diazepam 10 MG tablet  Commonly known as:  VALIUM  Take 10 mg by mouth at bedtime.     fluticasone 50 MCG/ACT nasal spray  Commonly known as:  FLONASE  Place 2 sprays into the nose 2 (two) times daily.     guaiFENesin 600 MG 12 hr tablet  Commonly known as:  MUCINEX  Take 1 tablet (600 mg total) by mouth 2 (two) times daily.     ipratropium 0.02 % nebulizer solution  Commonly known as:  ATROVENT  Take 2.5 mLs (0.5 mg total) by nebulization 4 (four) times daily.     loratadine 10 MG tablet  Commonly known as:  CLARITIN  Take 10 mg by mouth daily.     metFORMIN 500 MG tablet  Commonly known as:  GLUCOPHAGE  Take 1 tablet (500 mg total) by mouth daily with breakfast.     metoprolol tartrate 12.5 mg Tabs  Commonly known as:  LOPRESSOR  Take 0.5 tablets (12.5 mg total) by mouth 2 (two) times daily.     omeprazole 20 MG capsule  Commonly known as:  PRILOSEC  Take 20 mg by mouth 2 (two) times daily.     oxycodone 5 MG capsule  Commonly known as:  OXY-IR  Take 5 mg by mouth every 6 (six) hours as needed for pain.     predniSONE 10 MG tablet  Commonly known as:  DELTASONE  Take 4 tablets (40 mg total) by mouth daily with breakfast. Start 05/12/13.  Decrease by 1 tablet every day.     tiotropium 18 MCG inhalation capsule  Commonly known as:  SPIRIVA  Place 18 mcg into inhaler and inhale daily.      vitamin B-12 500 MCG tablet  Commonly known as:  CYANOCOBALAMIN  Take 500 mcg by mouth daily.       No Known Allergies    The results of significant diagnostics from this hospitalization (including imaging, microbiology, ancillary and laboratory) are listed below for reference.    Significant Diagnostic Studies: X-ray Chest Pa And Lateral   04/27/2013   *RADIOLOGY REPORT*  Clinical Data: Acute shortness of breath.  CHEST - 2 VIEW  Comparison: Chest x-ray 04/26/2013.  Findings: Lung volumes are normal.  No consolidative airspace disease.  No pleural effusions.  No  pneumothorax.  No pulmonary nodule or mass noted.  Pulmonary vasculature and the cardiomediastinal silhouette are within normal limits. Atherosclerosis in the thoracic aorta.  IMPRESSION: 1. No radiographic evidence of acute cardiopulmonary disease. 2.  Atherosclerosis.   Original Report Authenticated By: Trudie Reed, M.D.   US Abdomen Complete  05/10/2013   *RADIOLOGY REPORT*  Clinical Data:  Right upper quadrant pain  ABDOMINAL ULTRASOUND COMPLETE  Comparison:  01/15/2013  Findings:  Gallbladder:  No gallstones, gallbladder wall thickening, or pericholecystic fluid.  Common Bile Duct:  Within normal limits in caliber.  Liver: There is diffusely increased echogenicity without focal mass.  IVC:  Appears normal.  Pancreas:  The pancreas was poorly visualized.  No obvious mass.  Spleen:  Within normal limits in size and echotexture.  Right kidney:  Normal in size and parenchymal echogenicity.  No evidence of mass or hydronephrosis.  Left kidney:  Normal in size and parenchymal echogenicity.  No evidence of mass or hydronephrosis.  Abdominal Aorta:  Portions of the aorta were obscured.  No obvious aneurysm.  IMPRESSION: Limited visualization of the pancreas and aorta.  Probable diffuse hepatic steatosis.  Stable examination.   Original Report Authenticated By: Jolaine Click, M.D.   Dg Chest Portable 1 View  05/13/2013   *RADIOLOGY REPORT*   Clinical Data: Shortness of breath and cough.  PORTABLE CHEST - 1 VIEW  Comparison: Plain films of the chest 05/08/2013.  Findings: The lungs are clear.  Heart size is normal.  No pneumothorax or pleural fluid.  Study is somewhat limited by the patient's body habitus and portable technique.  IMPRESSION: No acute abnormality.   Original Report Authenticated By: Holley Dexter, M.D.   Dg Chest Port 1 View  04/26/2013   *RADIOLOGY REPORT*  Clinical Data: Shortness of breath  PORTABLE CHEST - 1 VIEW  Comparison: 04/13/2013  Findings: Cardiomediastinal silhouette is stable.  No acute infiltrate or pleural effusion.  No pulmonary edema.  Stable chronic mild interstitial prominence.  IMPRESSION: No active disease.  No significant change.   Original Report Authenticated By: Natasha Mead, M.D.    Microbiology: Recent Results (from the past 240 hour(s))  CLOSTRIDIUM DIFFICILE BY PCR     Status: None   Collection Time    05/14/13  8:42 AM      Result Value Range Status   C difficile by pcr NEGATIVE  NEGATIVE Final  URINE CULTURE     Status: None   Collection Time    05/16/13  3:37 AM      Result Value Range Status   Specimen Description URINE, CLEAN CATCH   Final   Special Requests NONE   Final   Culture  Setup Time 05/16/2013 15:32   Final   Colony Count 55,000 COLONIES/ML   Final   Culture     Final   Value: Multiple bacterial morphotypes present, none predominant. Suggest appropriate recollection if clinically indicated.   Report Status 05/17/2013 FINAL   Final     Labs: Basic Metabolic Panel:  Recent Labs Lab 05/13/13 2016 05/14/13 0854 05/15/13 0500 05/16/13 0620 05/17/13 0615  NA 140 139 140 138 138  K 4.6 4.6 4.9 4.7 4.6  CL 98 98 100 96 97  CO2 31 28 32 29 32  GLUCOSE 183* 264* 175* 372* 340*  BUN 25* 26* 29* 29* 26*  CREATININE 0.92 0.77 0.76 0.84 0.72  CALCIUM 9.6 9.8 9.6 9.5 9.0   Liver Function Tests:  Recent Labs Lab 05/14/13 0854  AST 57*  ALT 187*  ALKPHOS 71   BILITOT 0.3  PROT 7.6  ALBUMIN 3.6   No results found for this basename: LIPASE, AMYLASE,  in the last 168 hours No results found for this basename: AMMONIA,  in the last 168 hours CBC:  Recent Labs Lab 05/13/13 2016 05/14/13 0854 05/15/13 0500 05/16/13 0620 05/17/13 0615  WBC 13.7* 17.2* 17.2* 14.0* 10.7*  NEUTROABS 11.5* 15.4*  --   --   --   HGB 13.4 12.5 12.6 12.4 12.2  HCT 37.7 35.9* 36.2 35.9* 35.7*  MCV 83.8 84.1 84.8 84.7 85.6  PLT 196 213 226 221 209   Cardiac Enzymes:  Recent Labs Lab 05/14/13 0230 05/14/13 0854 05/14/13 2004  TROPONINI <0.30 <0.30 <0.30   BNP: BNP (last 3 results)  Recent Labs  01/17/13 1410 05/14/13 0230  PROBNP 103.2 46.2   CBG:  Recent Labs Lab 05/16/13 1633 05/16/13 2105 05/17/13 0614 05/17/13 1139 05/17/13 1649  GLUCAP 215* 263* 383* 242* 183*       Signed:  Kaliq Lege  [facility] 05/18/2013, 7:08 PM

## 2013-05-20 ENCOUNTER — Other Ambulatory Visit: Payer: Self-pay | Admitting: Emergency Medicine

## 2013-05-21 ENCOUNTER — Encounter (HOSPITAL_COMMUNITY): Payer: Self-pay | Admitting: Emergency Medicine

## 2013-05-21 ENCOUNTER — Inpatient Hospital Stay (HOSPITAL_COMMUNITY)
Admission: EM | Admit: 2013-05-21 | Discharge: 2013-05-26 | DRG: 190 | Disposition: A | Discharge status: Home Health | Payer: Medicaid Other | Attending: Family Medicine | Admitting: Family Medicine

## 2013-05-21 ENCOUNTER — Emergency Department (HOSPITAL_COMMUNITY): Payer: Medicaid Other

## 2013-05-21 DIAGNOSIS — K219 Gastro-esophageal reflux disease without esophagitis: Secondary | ICD-10-CM

## 2013-05-21 DIAGNOSIS — F419 Anxiety disorder, unspecified: Secondary | ICD-10-CM | POA: Diagnosis present

## 2013-05-21 DIAGNOSIS — D573 Sickle-cell trait: Secondary | ICD-10-CM | POA: Diagnosis present

## 2013-05-21 DIAGNOSIS — I10 Essential (primary) hypertension: Secondary | ICD-10-CM | POA: Diagnosis present

## 2013-05-21 DIAGNOSIS — J449 Chronic obstructive pulmonary disease, unspecified: Secondary | ICD-10-CM

## 2013-05-21 DIAGNOSIS — I5032 Chronic diastolic (congestive) heart failure: Secondary | ICD-10-CM

## 2013-05-21 DIAGNOSIS — R040 Epistaxis: Secondary | ICD-10-CM

## 2013-05-21 DIAGNOSIS — R490 Dysphonia: Secondary | ICD-10-CM

## 2013-05-21 DIAGNOSIS — Z87891 Personal history of nicotine dependence: Secondary | ICD-10-CM | POA: Diagnosis present

## 2013-05-21 DIAGNOSIS — R911 Solitary pulmonary nodule: Secondary | ICD-10-CM

## 2013-05-21 DIAGNOSIS — Z6841 Body Mass Index (BMI) 40.0 and over, adult: Secondary | ICD-10-CM

## 2013-05-21 DIAGNOSIS — F411 Generalized anxiety disorder: Secondary | ICD-10-CM | POA: Diagnosis present

## 2013-05-21 DIAGNOSIS — J309 Allergic rhinitis, unspecified: Secondary | ICD-10-CM

## 2013-05-21 DIAGNOSIS — F17200 Nicotine dependence, unspecified, uncomplicated: Secondary | ICD-10-CM | POA: Diagnosis present

## 2013-05-21 DIAGNOSIS — Z720 Tobacco use: Secondary | ICD-10-CM | POA: Diagnosis present

## 2013-05-21 DIAGNOSIS — J9620 Acute and chronic respiratory failure, unspecified whether with hypoxia or hypercapnia: Secondary | ICD-10-CM | POA: Diagnosis present

## 2013-05-21 DIAGNOSIS — R000 Tachycardia, unspecified: Secondary | ICD-10-CM | POA: Diagnosis present

## 2013-05-21 DIAGNOSIS — E119 Type 2 diabetes mellitus without complications: Secondary | ICD-10-CM | POA: Diagnosis present

## 2013-05-21 DIAGNOSIS — D72829 Elevated white blood cell count, unspecified: Secondary | ICD-10-CM

## 2013-05-21 DIAGNOSIS — G4733 Obstructive sleep apnea (adult) (pediatric): Secondary | ICD-10-CM | POA: Diagnosis present

## 2013-05-21 DIAGNOSIS — R079 Chest pain, unspecified: Secondary | ICD-10-CM

## 2013-05-21 DIAGNOSIS — J441 Chronic obstructive pulmonary disease with (acute) exacerbation: Principal | ICD-10-CM | POA: Diagnosis present

## 2013-05-21 DIAGNOSIS — Z79899 Other long term (current) drug therapy: Secondary | ICD-10-CM

## 2013-05-21 DIAGNOSIS — J209 Acute bronchitis, unspecified: Secondary | ICD-10-CM

## 2013-05-21 NOTE — ED Notes (Signed)
XR at bedside

## 2013-05-21 NOTE — ED Notes (Signed)
Dr Otter at bedside  

## 2013-05-21 NOTE — ED Notes (Signed)
Respiratory rate parameters adjusted to 8-35

## 2013-05-21 NOTE — ED Notes (Signed)
Patient reports hx of both GERD and Anxiety and states she takes medications daily for both of these. Patient initially c/o muscle spasms in her R leg on arrival, no longer complaining of this, patient now c/o mid sternal squeezing type pain rated 9.5/10. Patient states this is the same pain she was having in her leg and it has now moved. Patient states this started shortly after arrival to our facility.

## 2013-05-21 NOTE — ED Notes (Signed)
Per EMS. Patient was d/c from [facility] on 05/13/13. Patient with hx COPD, emphysema. Patient reports increased SOB all day today, states she has used "a bunch" of neb treatments at home. Patient states she uses 2L [place] at home. Patient received two 5 albuterol and 0.5 atrovent treatments while en route. Patient was speaking in broken sentences when they initially arrived.

## 2013-05-22 ENCOUNTER — Inpatient Hospital Stay: Payer: Medicaid Other | Admitting: Emergency Medicine

## 2013-05-22 DIAGNOSIS — J9620 Acute and chronic respiratory failure, unspecified whether with hypoxia or hypercapnia: Secondary | ICD-10-CM

## 2013-05-22 DIAGNOSIS — J441 Chronic obstructive pulmonary disease with (acute) exacerbation: Principal | ICD-10-CM

## 2013-05-22 DIAGNOSIS — F411 Generalized anxiety disorder: Secondary | ICD-10-CM

## 2013-05-22 DIAGNOSIS — F419 Anxiety disorder, unspecified: Secondary | ICD-10-CM | POA: Diagnosis present

## 2013-05-22 DIAGNOSIS — E119 Type 2 diabetes mellitus without complications: Secondary | ICD-10-CM | POA: Diagnosis present

## 2013-05-22 DIAGNOSIS — I10 Essential (primary) hypertension: Secondary | ICD-10-CM

## 2013-05-22 DIAGNOSIS — K219 Gastro-esophageal reflux disease without esophagitis: Secondary | ICD-10-CM

## 2013-05-22 LAB — BASIC METABOLIC PANEL
CO2: 36 mEq/L — ABNORMAL HIGH (ref 19–32)
Calcium: 9.9 mg/dL (ref 8.4–10.5)
Creatinine, Ser: 0.88 mg/dL (ref 0.50–1.10)
GFR calc non Af Amer: 73 mL/min — ABNORMAL LOW (ref >90)
Glucose, Bld: 198 mg/dL — ABNORMAL HIGH (ref 70–99)
Sodium: 139 mEq/L (ref 135–145)

## 2013-05-22 LAB — CBC WITH DIFFERENTIAL/PLATELET
Basophils Absolute: 0 10*3/uL (ref 0.0–0.1)
Eosinophils Absolute: 0.1 10*3/uL (ref 0.0–0.7)
Eosinophils Relative: 1 % (ref 0–5)
HCT: 41.6 % (ref 36.0–46.0)
Lymphocytes Relative: 8 % — ABNORMAL LOW (ref 12–46)
Lymphs Abs: 1.2 10*3/uL (ref 0.7–4.0)
MCH: 29.6 pg (ref 26.0–34.0)
MCV: 85.6 fL (ref 78.0–100.0)
Monocytes Absolute: 0.3 10*3/uL (ref 0.1–1.0)
Platelets: 256 10*3/uL (ref 150–400)
RDW: 15.5 % (ref 11.5–15.5)

## 2013-05-22 LAB — GLUCOSE, CAPILLARY
Glucose-Capillary: 249 mg/dL — ABNORMAL HIGH (ref 70–99)
Glucose-Capillary: 232 mg/dL — ABNORMAL HIGH (ref 70–99)
Glucose-Capillary: 136 mg/dL — ABNORMAL HIGH (ref 70–99)

## 2013-05-22 LAB — BLOOD GAS, ARTERIAL
Drawn by: 340271
FIO2: 0.36 %
Patient temperature: 98.6
pCO2 arterial: 62.7 mmHg (ref 35.0–45.0)
pH, Arterial: 7.368 (ref 7.350–7.450)

## 2013-05-22 MED ORDER — ACETAMINOPHEN 325 MG PO TABS
650.0000 mg | ORAL_TABLET | Freq: Four times a day (QID) | ORAL | Status: DC | PRN
  Administered 2013-05-26: 650 mg via ORAL
  Filled 2013-05-22: qty 2

## 2013-05-22 MED ORDER — METHYLPREDNISOLONE SODIUM SUCC 125 MG IJ SOLR
125.0000 mg | Freq: Once | INTRAMUSCULAR | Status: AC
  Administered 2013-05-22: 125 mg via INTRAVENOUS
  Filled 2013-05-22: qty 2

## 2013-05-22 MED ORDER — FLUTICASONE PROPIONATE 50 MCG/ACT NA SUSP
2.0000 | Freq: Two times a day (BID) | NASAL | Status: DC
  Administered 2013-05-22 – 2013-05-26 (×9): 2 via NASAL
  Filled 2013-05-22: qty 16

## 2013-05-22 MED ORDER — ALUM & MAG HYDROXIDE-SIMETH 200-200-20 MG/5ML PO SUSP
30.0000 mL | Freq: Four times a day (QID) | ORAL | Status: DC | PRN
  Administered 2013-05-23: 30 mL via ORAL
  Filled 2013-05-22: qty 30

## 2013-05-22 MED ORDER — OXYCODONE HCL 5 MG PO TABS
5.0000 mg | ORAL_TABLET | Freq: Four times a day (QID) | ORAL | Status: DC | PRN
Start: 2013-05-22 — End: 2013-05-26
  Administered 2013-05-22 – 2013-05-25 (×7): 5 mg via ORAL
  Filled 2013-05-22 (×9): qty 1

## 2013-05-22 MED ORDER — GUAIFENESIN ER 600 MG PO TB12
600.0000 mg | ORAL_TABLET | Freq: Two times a day (BID) | ORAL | Status: DC
  Administered 2013-05-22 – 2013-05-26 (×9): 600 mg via ORAL
  Filled 2013-05-22 (×10): qty 1

## 2013-05-22 MED ORDER — LORATADINE 10 MG PO TABS
10.0000 mg | ORAL_TABLET | Freq: Every day | ORAL | Status: DC
  Administered 2013-05-22 – 2013-05-26 (×5): 10 mg via ORAL
  Filled 2013-05-22 (×5): qty 1

## 2013-05-22 MED ORDER — ALBUTEROL SULFATE (5 MG/ML) 0.5% IN NEBU
2.5000 mg | INHALATION_SOLUTION | RESPIRATORY_TRACT | Status: DC
  Administered 2013-05-22 – 2013-05-24 (×16): 2.5 mg via RESPIRATORY_TRACT
  Filled 2013-05-22 (×16): qty 0.5

## 2013-05-22 MED ORDER — PANTOPRAZOLE SODIUM 40 MG PO TBEC
40.0000 mg | DELAYED_RELEASE_TABLET | Freq: Every day | ORAL | Status: DC
  Administered 2013-05-22 – 2013-05-25 (×4): 40 mg via ORAL
  Filled 2013-05-22 (×4): qty 1

## 2013-05-22 MED ORDER — CYANOCOBALAMIN 500 MCG PO TABS
500.0000 ug | ORAL_TABLET | Freq: Every day | ORAL | Status: DC
  Administered 2013-05-22 – 2013-05-26 (×5): 500 ug via ORAL
  Filled 2013-05-22 (×5): qty 1

## 2013-05-22 MED ORDER — BUDESONIDE-FORMOTEROL FUMARATE 160-4.5 MCG/ACT IN AERO
2.0000 | INHALATION_SPRAY | Freq: Two times a day (BID) | RESPIRATORY_TRACT | Status: DC
  Administered 2013-05-22 – 2013-05-26 (×9): 2 via RESPIRATORY_TRACT
  Filled 2013-05-22: qty 6

## 2013-05-22 MED ORDER — DIAZEPAM 5 MG PO TABS
10.0000 mg | ORAL_TABLET | Freq: Every day | ORAL | Status: DC
  Administered 2013-05-22 – 2013-05-25 (×4): 10 mg via ORAL
  Filled 2013-05-22 (×4): qty 2

## 2013-05-22 MED ORDER — ACETAMINOPHEN 650 MG RE SUPP: 650.0000 mg | Freq: Four times a day (QID) | RECTAL | Status: DC | PRN

## 2013-05-22 MED ORDER — HEPARIN SODIUM (PORCINE) 5000 UNIT/ML IJ SOLN
5000.0000 [IU] | Freq: Three times a day (TID) | INTRAMUSCULAR | Status: DC
  Administered 2013-05-22 – 2013-05-26 (×13): 5000 [IU] via SUBCUTANEOUS
  Filled 2013-05-22 (×16): qty 1

## 2013-05-22 MED ORDER — METFORMIN HCL 500 MG PO TABS
500.0000 mg | ORAL_TABLET | Freq: Every day | ORAL | Status: DC
  Administered 2013-05-22 – 2013-05-26 (×5): 500 mg via ORAL
  Filled 2013-05-22 (×6): qty 1

## 2013-05-22 MED ORDER — INSULIN ASPART 100 UNIT/ML ~~LOC~~ SOLN
0.0000 [IU] | Freq: Three times a day (TID) | SUBCUTANEOUS | Status: DC
  Administered 2013-05-22: 7 [IU] via SUBCUTANEOUS
  Administered 2013-05-22: 3 [IU] via SUBCUTANEOUS
  Administered 2013-05-22: 7 [IU] via SUBCUTANEOUS
  Administered 2013-05-23: 11 [IU] via SUBCUTANEOUS
  Administered 2013-05-23: 4 [IU] via SUBCUTANEOUS
  Administered 2013-05-24 (×2): 7 [IU] via SUBCUTANEOUS
  Administered 2013-05-24: 4 [IU] via SUBCUTANEOUS
  Administered 2013-05-25: 7 [IU] via SUBCUTANEOUS
  Administered 2013-05-25: 4 [IU] via SUBCUTANEOUS
  Administered 2013-05-26: 7 [IU] via SUBCUTANEOUS
  Administered 2013-05-26: 3 [IU] via SUBCUTANEOUS

## 2013-05-22 MED ORDER — SENNOSIDES-DOCUSATE SODIUM 8.6-50 MG PO TABS
1.0000 | ORAL_TABLET | Freq: Every evening | ORAL | Status: DC | PRN
  Filled 2013-05-22: qty 1

## 2013-05-22 MED ORDER — ONDANSETRON HCL 4 MG/2ML IJ SOLN: 4.0000 mg | Freq: Four times a day (QID) | INTRAMUSCULAR | Status: DC | PRN

## 2013-05-22 MED ORDER — ASPIRIN EC 81 MG PO TBEC
81.0000 mg | DELAYED_RELEASE_TABLET | Freq: Every day | ORAL | Status: DC
  Administered 2013-05-22 – 2013-05-26 (×5): 81 mg via ORAL
  Filled 2013-05-22 (×5): qty 1

## 2013-05-22 MED ORDER — ONDANSETRON HCL 4 MG PO TABS
4.0000 mg | ORAL_TABLET | Freq: Four times a day (QID) | ORAL | Status: DC | PRN
  Administered 2013-05-25: 4 mg via ORAL
  Filled 2013-05-22: qty 1

## 2013-05-22 MED ORDER — PREDNISONE 50 MG PO TABS
60.0000 mg | ORAL_TABLET | Freq: Two times a day (BID) | ORAL | Status: DC
  Administered 2013-05-22 – 2013-05-24 (×5): 60 mg via ORAL
  Filled 2013-05-22 (×7): qty 1

## 2013-05-22 MED ORDER — DOCUSATE SODIUM 100 MG PO CAPS
100.0000 mg | ORAL_CAPSULE | Freq: Two times a day (BID) | ORAL | Status: DC
  Administered 2013-05-22 – 2013-05-26 (×9): 100 mg via ORAL
  Filled 2013-05-22 (×10): qty 1

## 2013-05-22 MED ORDER — IPRATROPIUM BROMIDE 0.02 % IN SOLN
0.5000 mg | Freq: Four times a day (QID) | RESPIRATORY_TRACT | Status: DC
  Administered 2013-05-22 – 2013-05-25 (×15): 0.5 mg via RESPIRATORY_TRACT
  Filled 2013-05-22 (×15): qty 2.5

## 2013-05-22 MED ORDER — DOXYCYCLINE HYCLATE 100 MG PO TABS
100.0000 mg | ORAL_TABLET | Freq: Two times a day (BID) | ORAL | Status: DC
  Administered 2013-05-23 – 2013-05-26 (×7): 100 mg via ORAL
  Filled 2013-05-22 (×8): qty 1

## 2013-05-22 MED ORDER — CYCLOBENZAPRINE HCL 10 MG PO TABS
10.0000 mg | ORAL_TABLET | Freq: Two times a day (BID) | ORAL | Status: DC | PRN
  Administered 2013-05-23 (×2): 10 mg via ORAL
  Filled 2013-05-22 (×2): qty 1

## 2013-05-22 MED ORDER — INSULIN ASPART 100 UNIT/ML ~~LOC~~ SOLN
0.0000 [IU] | Freq: Every day | SUBCUTANEOUS | Status: DC
  Administered 2013-05-22: 4 [IU] via SUBCUTANEOUS
  Administered 2013-05-23: 5 [IU] via SUBCUTANEOUS

## 2013-05-22 NOTE — ED Notes (Signed)
Ambulated pt with portable pulse ox-pt acutely dyspneic with slight exertion-ambulated down side hall-RR increased to 40's/HR increased to 165 and O2 sat decreased to 83% on O2 4l/[place]-Dr. Norlene Campbell made aware-pt sitting on end of bed-too dyspneic to get back in bed-bedside table given to pt to support herself.  Placed back on monitor and continuous pulse ox.  O2 at 4l/[place]-Dr. Norlene Campbell back in to talk with pt-pt at this time agreeable to admission.

## 2013-05-22 NOTE — Progress Notes (Signed)
RT notified by RN. Patient asking for nocturnal CPAP. Placed on 10cmH20 via nasal apparatus and 4L O2 bleed. Ramp set to start at 4cm. Patient tolerated for approximately 3 minutes and refuses to try any longer at this time. She does, however, stress the need to get used to using a device at night and states she is aware that this will help her in the future. She complains that the feel of the flow/pressure is not like her home equipment, and she would like to possibly use it during awake hours to help her adjust to the feel. She also expressed interest in having her home health aide to bring in her home equipment for use during this admission. RT provided education and discussed options for CPAP. She continues to refuse to attempt again at this time. She states it makes her feel anxious and like she can not breath.

## 2013-05-22 NOTE — Progress Notes (Signed)
Patient seen and evaluated earlier this am by my associate.  Please refer to H and P for details regarding Assessment and plan.  Will reassess next am.  Discussed with nursing decreasing supplemental oxygen so that not to decrease respiratory drive in COPD patient.  Nazirah Tri, [employer]

## 2013-05-22 NOTE — ED Provider Notes (Signed)
History    CSN: [id number] Arrival date & time 05/21/13  2154  First MD Initiated Contact with Patient 05/21/13 2304     Chief Complaint  Patient presents with  . Shortness of Breath   (Consider location/radiation/quality/duration/timing/severity/associated sxs/prior Treatment) HPI] 57 yo female presents to the ER with complaint of sob, wheezing.  Pt with h/o COPD, recently left AMA on 7/2 after admission for COPD exacerbation.  Pt reports over the last 2 days she has had increased sob with exertion, tightness, wheezing.  She reports she has been using her medications as prescribed, using neb tx q 1-2 hours without relief.  Pt received albuterol/atrovent in route, reported to have been speaking in broken sentences initially.  Pt without difficulties speaking.  Reports steroid taper ended on Tuesday.  Reports she has not smoked since being admitted to the hospital this last time.  Past Medical History  Diagnosis Date  . Borderline diabetes   . Hypertension   . Anxiety   . Emphysema   . COPD (chronic obstructive pulmonary disease)   . OSA on CPAP     CPAP at night   . Dizziness     pt believes this is motion sickness or vertigo  . GERD (gastroesophageal reflux disease)   . Headache(784.0)   . Cancer 1990    cervical   . Anemia   . Morbid obesity   . Sickle cell trait   . Hx of cardiac catheterization     a. LHC at [facility] in [place], [place] 09/2008:  Normal coronary arteries EF 70%.  . Tobacco abuse     a. up to 3ppd from age 16 to 24, now 1/4 ppd (01/2013)  . Shortness of breath   . Pneumonia    Past Surgical History  Procedure Laterality Date  . Tubal ligation    . Cardiac catheterization    . Colonoscopy  09/05/2012    Procedure: COLONOSCOPY;  Surgeon: Theda Belfast, MD;  Location: [facility];  Service: Endoscopy;  Laterality: N/A;   Family History  Problem Relation Age of Onset  . Lung cancer Paternal Aunt   . Lung cancer Paternal Grandfather   . Other Father    unaware of father's medical history  . Diabetes Mother     alive @ 85  . Other      multiple siblings a&w.   History  Substance Use Topics  . Smoking status: Current Every Day Smoker -- 0.25 packs/day for 36 years    Types: Cigarettes  . Smokeless tobacco: Never Used     Comment: Approx 90 pk-yrs (up to 3ppd until ~ 2009). Smoking 5 cigs per day now.  . Alcohol Use: No   OB History   Grav Para Term Preterm Abortions TAB SAB Ect Mult Living                 Review of Systems  All other systems reviewed and are negative.    Allergies  Review of patient's allergies indicates no known allergies.  Home Medications   Current Outpatient Rx  Name  Route  Sig  Dispense  Refill  . albuterol (PROVENTIL HFA;VENTOLIN HFA) 108 (90 BASE) MCG/ACT inhaler   Inhalation   Inhale 2 puffs into the lungs every 4 (four) hours as needed for wheezing.   1 Inhaler   3   . albuterol (PROVENTIL) (2.5 MG/3ML) 0.083% nebulizer solution   Nebulization   Take 3 mLs (2.5 mg total) by nebulization 4 (four) times daily.  DX: 496. For wheezing   75 mL   12   . aspirin EC 81 MG tablet   Oral   Take 81 mg by mouth daily.         . budesonide-formoterol (SYMBICORT) 160-4.5 MCG/ACT inhaler   Inhalation   Inhale 2 puffs into the lungs 2 (two) times daily.         . cyclobenzaprine (FLEXERIL) 10 MG tablet   Oral   Take 10 mg by mouth 2 (two) times daily as needed for muscle spasms.         . diazepam (VALIUM) 10 MG tablet   Oral   Take 10 mg by mouth at bedtime.         . fluticasone (FLONASE) 50 MCG/ACT nasal spray   Nasal   Place 2 sprays into the nose 2 (two) times daily.   16 g   11   . guaiFENesin (MUCINEX) 600 MG 12 hr tablet   Oral   Take 1 tablet (600 mg total) by mouth 2 (two) times daily.   30 tablet   0   . ipratropium (ATROVENT) 0.02 % nebulizer solution   Nebulization   Take 2.5 mLs (0.5 mg total) by nebulization 4 (four) times daily.   75 mL   12   . loratadine  (CLARITIN) 10 MG tablet   Oral   Take 10 mg by mouth daily.         Marland Kitchen omeprazole (PRILOSEC) 20 MG capsule   Oral   Take 20 mg by mouth 2 (two) times daily.         Marland Kitchen oxycodone (OXY-IR) 5 MG capsule   Oral   Take 5 mg by mouth every 6 (six) hours as needed for pain.         . predniSONE (DELTASONE) 10 MG tablet   Oral   Take 4 tablets (40 mg total) by mouth daily with breakfast. Start 05/12/13.  Decrease by 1 tablet every day.   10 tablet   0   . tiotropium (SPIRIVA) 18 MCG inhalation capsule   Inhalation   Place 18 mcg into inhaler and inhale daily.         . vitamin B-12 (CYANOCOBALAMIN) 500 MCG tablet   Oral   Take 500 mcg by mouth daily.         . metFORMIN (GLUCOPHAGE) 500 MG tablet   Oral   Take 1 tablet (500 mg total) by mouth daily with breakfast.   30 tablet   0   . metoprolol tartrate (LOPRESSOR) 12.5 mg TABS   Oral   Take 0.5 tablets (12.5 mg total) by mouth 2 (two) times daily.   60 tablet   0    BP 139/74  Pulse 84  Temp(Src) 98.6 F (37 C) (Oral)  Resp 28  SpO2 98%  LMP 06/04/2000 Physical Exam  Constitutional: She is oriented to person, place, and time. She appears well-developed and well-nourished.  Morbidly obese female, hoarse voice  HENT:  Head: Normocephalic and atraumatic.  Nose: Nose normal.  Mouth/Throat: Oropharynx is clear and moist.  Eyes: Conjunctivae and EOM are normal. Pupils are equal, round, and reactive to light.  Neck: Normal range of motion. Neck supple. No JVD present. No tracheal deviation present. No thyromegaly present.  Cardiovascular: Normal rate, regular rhythm, normal heart sounds and intact distal pulses.  Exam reveals no gallop and no friction rub.   No murmur heard. Pulmonary/Chest: Effort normal. No stridor. No respiratory distress. She  has no wheezes. She has no rales. She exhibits no tenderness.  Prolonged expiratory phase, distant lung sounds but no wheeze noted  Abdominal: Soft. Bowel sounds are  normal. She exhibits no distension and no mass. There is no tenderness. There is no rebound and no guarding.  Musculoskeletal: Normal range of motion. She exhibits no edema and no tenderness.  Lymphadenopathy:    She has no cervical adenopathy.  Neurological: She is alert and oriented to person, place, and time. No cranial nerve deficit. She exhibits normal muscle tone. Coordination normal.  Skin: Skin is warm and dry. No rash noted. No erythema. No pallor.  Psychiatric: She has a normal mood and affect. Her behavior is normal. Judgment and thought content normal.    ED Course  Procedures (including critical care time) Labs Reviewed  CBC WITH DIFFERENTIAL - Abnormal; Notable for the following:    WBC 14.6 (*)    Neutrophils Relative % 90 (*)    Neutro Abs 13.1 (*)    Lymphocytes Relative 8 (*)    Monocytes Relative 2 (*)    All other components within normal limits  BASIC METABOLIC PANEL   Dg Chest Port 1 View  05/21/2013   *RADIOLOGY REPORT*  Clinical Data: Shortness of breath.  COPD.  PORTABLE CHEST - 1 VIEW  Comparison: 05/13/2013  Findings: Shallow inspiration.  Heart size and pulmonary vascularity are normal for technique and stable since previous study.  No focal consolidation or airspace disease.  No blunting of costophrenic angles.  No pneumothorax.  Mediastinal contours appear intact.  Calcification of the aorta.  IMPRESSION: No evidence of active pulmonary disease.  No significant change since previous study.   Original Report Authenticated By: Burman Nieves, M.D.   1. COPD exacerbation     MDM  57 yo female with sob, wheezing.  Wheezing much improved after one neb, thought initially may be due to anxiety, living along.  Pt ambulated with significant drop on o2 sats despite her home 02, became dyspneic and fatigued quickly.  No acute findings on workup.  Pt stopped steroid taper 2 days ago.  Reports she quit smoking, but does smell heavily of tobacco.  D/w hospitalist for  admission.  Olivia Mackie, MD 05/22/13 [phone]

## 2013-05-22 NOTE — Progress Notes (Signed)
CRITICAL VALUE ALERT  Critical value received:  CO2 64.4  Date of notification:  05/22/2013  Time of notification:  1630  Critical value read back:yes  Nurse who received alert:  Philbert Riser, RN  MD notified (1st page): Cena Benton  Time of first page:  1630  MD notified (2nd page):Vega  Time of second page:1630  Responding MD:  Cena Benton  Time MD responded:  1630

## 2013-05-22 NOTE — ED Notes (Signed)
Attempted to ambulate patient in hallway with O2 and portable pulse ox. Patient would not keep probe on her finger and became frustrated with me as I attempted to replace SPO2 sensor. Patient returned to her room and refused to ambulate any further. Patient requested to see her physician. Dr Norlene Campbell to bedside.

## 2013-05-22 NOTE — H&P (Signed)
[facility] History and Physical  Armenta Erskin ZOX:[id number] DOB: Sep 04, 1956 DOA: 05/21/2013  Referring physician: Norlene Campbell PCP: Lonia Blood, MD   Chief Complaint:   HPI: Stephanie Schultz is a 57 y.o. female with a history of COPD, chronic respiratory failure and obstructive sleep apnea who presents with worsening shortness of breath. She has been admitted to the hospital 4 times in the past 6 weeks. She left the hospital AGAINST MEDICAL ADVICE just 3 days ago. According to discharge summary, it was to go smoke. However, she reports that she is no longer smoking. Patient received bronchodilators and steroids here in the emergency room. Chest x-ray shows no pneumonia. The plan was to discharge home, but she became severely short of breath with ambulation, dropped her oxygen saturations into the 80s, so hospitalists were called to admit. During last hospitalization, she was on levofloxacin and azithromycin. She has been coughing up thick sputum. She has been seen by Dr. Delton Coombes during one of the recent hospitalizations and was to followup in his office today.   Review of Systems: as above. Otherwise negative  Past Medical History  Diagnosis Date  . Borderline diabetes   . Hypertension   . Anxiety   . Emphysema   . COPD (chronic obstructive pulmonary disease)   . OSA on CPAP     CPAP at night   . Dizziness     pt believes this is motion sickness or vertigo  . GERD (gastroesophageal reflux disease)   . Headache(784.0)   . Cancer 1990    cervical   . Anemia   . Morbid obesity   . Sickle cell trait   . Hx of cardiac catheterization     a. LHC at [facility] in [place], [place] 09/2008:  Normal coronary arteries EF 70%.  . Tobacco abuse     a. up to 3ppd from age 67 to 72, now 1/4 ppd (01/2013)  . Shortness of breath   . Pneumonia    Past Surgical History  Procedure Laterality Date  . Tubal ligation    . Cardiac catheterization    . Colonoscopy  09/05/2012    Procedure: COLONOSCOPY;  Surgeon:  Theda Belfast, MD;  Location: [facility];  Service: Endoscopy;  Laterality: N/A;   Social History: Heavy smoking history, previously up to 3 packs a day. Denies current use, but records show she left AMA to smoke a cigarette just 3 days ago. Denies heavy drinking or drug use.  No Known Allergies  Family History  Problem Relation Age of Onset  . Lung cancer Paternal Aunt   . Lung cancer Paternal Grandfather   . Other Father     unaware of father's medical history  . Diabetes Mother     alive @ 73  . Other      multiple siblings a&w.    Prior to Admission medications   Medication Sig Start Date End Date Taking? Authorizing Provider  albuterol (PROVENTIL HFA;VENTOLIN HFA) 108 (90 BASE) MCG/ACT inhaler Inhale 2 puffs into the lungs every 4 (four) hours as needed for wheezing. 03/11/13 03/11/14 Yes Leslye Peer, MD  albuterol (PROVENTIL) (2.5 MG/3ML) 0.083% nebulizer solution Take 3 mLs (2.5 mg total) by nebulization 4 (four) times daily. DX: 496. For wheezing 04/15/13 04/15/14 Yes Ripudeep Jenna Luo, MD  aspirin EC 81 MG tablet Take 81 mg by mouth daily.   Yes Historical Provider, MD  budesonide-formoterol (SYMBICORT) 160-4.5 MCG/ACT inhaler Inhale 2 puffs into the lungs 2 (two) times daily.   Yes Historical  Provider, MD  cyclobenzaprine (FLEXERIL) 10 MG tablet Take 10 mg by mouth 2 (two) times daily as needed for muscle spasms.   Yes Historical Provider, MD  diazepam (VALIUM) 10 MG tablet Take 10 mg by mouth at bedtime.   Yes Historical Provider, MD  fluticasone (FLONASE) 50 MCG/ACT nasal spray Place 2 sprays into the nose 2 (two) times daily. 07/08/12 07/08/13 Yes Leslye Peer, MD  guaiFENesin (MUCINEX) 600 MG 12 hr tablet Take 1 tablet (600 mg total) by mouth 2 (two) times daily. 04/15/13  Yes Ripudeep K Rai, MD  ipratropium (ATROVENT) 0.02 % nebulizer solution Take 2.5 mLs (0.5 mg total) by nebulization 4 (four) times daily. 04/30/13  Yes Shanker Levora Dredge, MD  loratadine (CLARITIN) 10 MG  tablet Take 10 mg by mouth daily.   Yes Historical Provider, MD  omeprazole (PRILOSEC) 20 MG capsule Take 20 mg by mouth 2 (two) times daily.   Yes Historical Provider, MD  oxycodone (OXY-IR) 5 MG capsule Take 5 mg by mouth every 6 (six) hours as needed for pain.   Yes Historical Provider, MD  predniSONE (DELTASONE) 10 MG tablet Take 4 tablets (40 mg total) by mouth daily with breakfast. Start 05/12/13.  Decrease by 1 tablet every day. 05/11/13  Yes Catarina Hartshorn, MD  tiotropium (SPIRIVA) 18 MCG inhalation capsule Place 18 mcg into inhaler and inhale daily.   Yes Historical Provider, MD  vitamin B-12 (CYANOCOBALAMIN) 500 MCG tablet Take 500 mcg by mouth daily.   Yes Historical Provider, MD  metFORMIN (GLUCOPHAGE) 500 MG tablet Take 1 tablet (500 mg total) by mouth daily with breakfast. 05/11/13   Catarina Hartshorn, MD  metoprolol tartrate (LOPRESSOR) 12.5 mg TABS Take 0.5 tablets (12.5 mg total) by mouth 2 (two) times daily. 05/11/13   Catarina Hartshorn, MD   Physical Exam: Filed Vitals:   05/21/13 2201 05/21/13 2208  BP:  139/74  Pulse:  84  Temp:  98.6 F (37 C)  TempSrc:  Oral  Resp:  28  SpO2: 99% 98%   BP 121/52  Pulse 92  Temp(Src) 98.6 F (37 C) (Oral)  Resp 28  SpO2 96%  LMP 06/04/2000  General Appearance:    Alert, cooperative, no distress, appears stated age. cushingoid   Head:    Normocephalic, without obvious abnormality, atraumatic.nose and face sees   Eyes:    PERRL, conjunctiva/corneas clear, EOM's intact, fundi    benign, both eyes  Ears:    Normal TM's and external ear canals, both ears  Nose:   Nares normal, septum midline, mucosa normal, no drainage    or sinus tenderness  Throat:   Lips, mucosa, and tongue normal; teeth and gums normal  Neck:   Supple, symmetrical, trachea midline, no adenopathy;    thyroid:  no enlargement/tenderness/nodules; no carotid   bruit or JVD  Back:     Symmetric, no curvature, ROM normal, no CVA tenderness  Lungs:     diminished throughout without  wheezes rhonchi or rales   Chest Wall:    No tenderness or deformity   Heart:    Regular rate and rhythm, S1 and S2 normal, no murmur, rub   or gallop     Abdomen:     Soft, non-tender, bowel sounds active all four quadrants,    no masses, no organomegaly  Genitalia:   deferred  Rectal:   deferred  Extremities:   Extremities normal, atraumatic, no cyanosis or edema  Pulses:   2+ and symmetric all extremities  Skin:   Skin color, texture, turgor normal, no rashes or lesions  Lymph nodes:   Cervical, supraclavicular, and axillary nodes normal  Neurologic:   CNII-XII intact, normal strength, sensation and reflexes    throughout    Psychiatric: Currently calm and cooperative, but according to ED physician, was agitated previously   Labs on Admission:  Basic Metabolic Panel:  Recent Labs Lab 05/15/13 0500 05/16/13 0620 05/17/13 0615 05/22/13 0045  NA 140 138 138 139  K 4.9 4.7 4.6 4.3  CL 100 96 97 93*  CO2 32 29 32 36*  GLUCOSE 175* 372* 340* 198*  BUN 29* 29* 26* 18  CREATININE 0.76 0.84 0.72 0.88  CALCIUM 9.6 9.5 9.0 9.9   Liver Function Tests: No results found for this basename: AST, ALT, ALKPHOS, BILITOT, PROT, ALBUMIN,  in the last 168 hours No results found for this basename: LIPASE, AMYLASE,  in the last 168 hours No results found for this basename: AMMONIA,  in the last 168 hours CBC:  Recent Labs Lab 05/15/13 0500 05/16/13 0620 05/17/13 0615 05/22/13 0045  WBC 17.2* 14.0* 10.7* 14.6*  NEUTROABS  --   --   --  13.1*  HGB 12.6 12.4 12.2 14.4  HCT 36.2 35.9* 35.7* 41.6  MCV 84.8 84.7 85.6 85.6  PLT 226 221 209 256   Cardiac Enzymes: No results found for this basename: CKTOTAL, CKMB, CKMBINDEX, TROPONINI,  in the last 168 hours  BNP (last 3 results)  Recent Labs  01/17/13 1410 05/14/13 0230  PROBNP 103.2 46.2   CBG:  Recent Labs Lab 05/16/13 1633 05/16/13 2105 05/17/13 0614 05/17/13 1139 05/17/13 1649  GLUCAP 215* 263* 383* 242* 183*     Radiological Exams on Admission: Dg Chest Port 1 View  05/21/2013   *RADIOLOGY REPORT*  Clinical Data: Shortness of breath.  COPD.  PORTABLE CHEST - 1 VIEW  Comparison: 05/13/2013  Findings: Shallow inspiration.  Heart size and pulmonary vascularity are normal for technique and stable since previous study.  No focal consolidation or airspace disease.  No blunting of costophrenic angles.  No pneumothorax.  Mediastinal contours appear intact.  Calcification of the aorta.  IMPRESSION: No evidence of active pulmonary disease.  No significant change since previous study.   Original Report Authenticated By: Burman Nieves, M.D.     Assessment/Plan Principal Problem:   COPD exacerbation: With recurrent hospitalizations. Will admit. Steroids, bronchodilators, oxygen, CPAP nightly, continue twice a day PPI. Needs to quit smoking! Recently had 5 days of levofloxacin and azithromycin. Will give doxycycline. Active Problems:   Hypertension   GERD (gastroesophageal reflux disease)   Hoarseness   Acute and chronic respiratory failure   Tobacco abuse   Morbid obesity   DM type 2 (diabetes mellitus, type 2): Continue metformin. Will give sliding scale insulin.   Anxiety   Code Status: full Family Communication: none today Disposition Plan: home  Time spent: 60 minutes  Rickayla Wieland L [facility] Pager [phone]  If 7PM-7AM, please contact night-coverage [email or web address] Password [email or web address] 05/22/2013, 1:24 AM

## 2013-05-22 NOTE — ED Notes (Signed)
Attempted to call report, "nurse doesn't know she is getting a patient, she'll have to call you back" per chandra.

## 2013-05-23 DIAGNOSIS — D72829 Elevated white blood cell count, unspecified: Secondary | ICD-10-CM

## 2013-05-23 DIAGNOSIS — J449 Chronic obstructive pulmonary disease, unspecified: Secondary | ICD-10-CM

## 2013-05-23 DIAGNOSIS — R079 Chest pain, unspecified: Secondary | ICD-10-CM

## 2013-05-23 LAB — GLUCOSE, CAPILLARY
Glucose-Capillary: 173 mg/dL — ABNORMAL HIGH (ref 70–99)
Glucose-Capillary: 104 mg/dL — ABNORMAL HIGH (ref 70–99)
Glucose-Capillary: 252 mg/dL — ABNORMAL HIGH (ref 70–99)

## 2013-05-23 NOTE — Progress Notes (Addendum)
Dayshift RT setup CPAP machine for patient but she has refused to wear it tonight, RT to monitor and assess as needed.

## 2013-05-23 NOTE — Progress Notes (Signed)
Pt states she does not want to wear CPAP tonight. RT will continue to monitor.

## 2013-05-23 NOTE — Progress Notes (Signed)
[facility] PROGRESS NOTE  Stephanie Schultz ZOX:[id number] DOB: 28-Jul-1956 DOA: 05/21/2013 PCP: Lonia Blood, MD  Assessment/Plan: 1. COPD exacerbation - Continue steroids (currently on prednisone 60 mg po daily), symbicort, albuterol, and doxycycline. Patient feels improvement on this regimen. - Will recheck cbc next am - Will try to wean patient back to home regimen of supplemental oxygen (2L)  2.  HTN - No antihypertensive medication on board - place on low sodium diet  3. GERD - Protonix on board currently stable  4. DM 2 - On diabetic diet - continue routine CBG's - Continue SSI - Patient on metformin with no plans to obtain CT scan with contrast I will continue currently.  Code Status: full Family Communication: no family at bedside Disposition Plan: Pending improvement in respiratory rate   Consultants:  None  Procedures:  None  Antibiotics:  doxycycline  HPI/Subjective: Patient feels better currently.  Still not at baseline  Objective: Filed Vitals:   05/23/13 0200 05/23/13 0600 05/23/13 0806 05/23/13 1159  BP: 150/73 142/58    Pulse: 102 100    Temp: 98.2 F (36.8 C) 97.7 F (36.5 C)    TempSrc: Oral Oral    Resp: 20 20    Height:      Weight:      SpO2: 100% 100% 100% 95%   No intake or output data in the 24 hours ending 05/23/13 1502 Filed Weights   05/22/13 0200  Weight: 99.4 kg (219 lb 2.2 oz)    Exam:   General:  Pt in NAD, alert and oriented  Cardiovascular: RRR, no MRG  Respiratory: prolonged expiratory phase, mild expiratory wheeze  Abdomen: soft, NT, ND  Musculoskeletal: no cyanosis or clubbing   Data Reviewed: Basic Metabolic Panel:  Recent Labs Lab 05/17/13 0615 05/22/13 0045  NA 138 139  K 4.6 4.3  CL 97 93*  CO2 32 36*  GLUCOSE 340* 198*  BUN 26* 18  CREATININE 0.72 0.88  CALCIUM 9.0 9.9   Liver Function Tests: No results found for this basename: AST, ALT, ALKPHOS, BILITOT, PROT, ALBUMIN,  in the last  168 hours No results found for this basename: LIPASE, AMYLASE,  in the last 168 hours No results found for this basename: AMMONIA,  in the last 168 hours CBC:  Recent Labs Lab 05/17/13 0615 05/22/13 0045  WBC 10.7* 14.6*  NEUTROABS  --  13.1*  HGB 12.2 14.4  HCT 35.7* 41.6  MCV 85.6 85.6  PLT 209 256   Cardiac Enzymes: No results found for this basename: CKTOTAL, CKMB, CKMBINDEX, TROPONINI,  in the last 168 hours BNP (last 3 results)  Recent Labs  01/17/13 1410 05/14/13 0230  PROBNP 103.2 46.2   CBG:  Recent Labs Lab 05/22/13 1157 05/22/13 1654 05/22/13 2207 05/23/13 0802 05/23/13 1151  GLUCAP 232* 136* 314* 173* 104*    Recent Results (from the past 240 hour(s))  CLOSTRIDIUM DIFFICILE BY PCR     Status: None   Collection Time    05/14/13  8:42 AM      Result Value Range Status   C difficile by pcr NEGATIVE  NEGATIVE Final  URINE CULTURE     Status: None   Collection Time    05/16/13  3:37 AM      Result Value Range Status   Specimen Description URINE, CLEAN CATCH   Final   Special Requests NONE   Final   Culture  Setup Time 05/16/2013 15:32   Final   Colony Count 55,000 COLONIES/ML  Final   Culture     Final   Value: Multiple bacterial morphotypes present, none predominant. Suggest appropriate recollection if clinically indicated.   Report Status 05/17/2013 FINAL   Final     Studies: Dg Chest Port 1 View  05/21/2013   *RADIOLOGY REPORT*  Clinical Data: Shortness of breath.  COPD.  PORTABLE CHEST - 1 VIEW  Comparison: 05/13/2013  Findings: Shallow inspiration.  Heart size and pulmonary vascularity are normal for technique and stable since previous study.  No focal consolidation or airspace disease.  No blunting of costophrenic angles.  No pneumothorax.  Mediastinal contours appear intact.  Calcification of the aorta.  IMPRESSION: No evidence of active pulmonary disease.  No significant change since previous study.   Original Report Authenticated By: Burman Nieves, M.D.    Scheduled Meds: . albuterol  2.5 mg Nebulization Q4H  . aspirin EC  81 mg Oral Daily  . budesonide-formoterol  2 puff Inhalation BID  . cyanocobalamin  500 mcg Oral Daily  . diazepam  10 mg Oral QHS  . docusate sodium  100 mg Oral BID  . doxycycline  100 mg Oral Q12H  . fluticasone  2 spray Each Nare BID  . guaiFENesin  600 mg Oral BID  . heparin  5,000 Units Subcutaneous Q8H  . insulin aspart  0-20 Units Subcutaneous TID WC  . insulin aspart  0-5 Units Subcutaneous QHS  . ipratropium  0.5 mg Nebulization QID  . loratadine  10 mg Oral Daily  . metFORMIN  500 mg Oral Q breakfast  . pantoprazole  40 mg Oral Daily  . predniSONE  60 mg Oral BID WC   Continuous Infusions:   Principal Problem:   COPD exacerbation Active Problems:   Hypertension   GERD (gastroesophageal reflux disease)   Hoarseness   Acute and chronic respiratory failure   Tobacco abuse   Morbid obesity   DM type 2 (diabetes mellitus, type 2)   Anxiety    Time spent: > 35 minutes    Stephanie Schultz  [facility] Pager [phone]. If 7PM-7AM, please contact night-coverage at [email or web address], password [facility] 05/23/2013, 3:02 PM  LOS: 2 days

## 2013-05-24 DIAGNOSIS — J209 Acute bronchitis, unspecified: Secondary | ICD-10-CM

## 2013-05-24 LAB — CBC
Hemoglobin: 12.8 g/dL (ref 12.0–15.0)
MCH: 29.6 pg (ref 26.0–34.0)
MCHC: 34.6 g/dL (ref 30.0–36.0)
RDW: 15.8 % — ABNORMAL HIGH (ref 11.5–15.5)

## 2013-05-24 LAB — GLUCOSE, CAPILLARY
Glucose-Capillary: 347 mg/dL — ABNORMAL HIGH (ref 70–99)
Glucose-Capillary: 235 mg/dL — ABNORMAL HIGH (ref 70–99)

## 2013-05-24 MED ORDER — DEXTROMETHORPHAN POLISTIREX 30 MG/5ML PO LQCR
30.0000 mg | Freq: Two times a day (BID) | ORAL | Status: DC
  Administered 2013-05-24 – 2013-05-26 (×6): 30 mg via ORAL
  Filled 2013-05-24 (×7): qty 5

## 2013-05-24 MED ORDER — PREDNISONE 50 MG PO TABS
60.0000 mg | ORAL_TABLET | Freq: Every day | ORAL | Status: DC
  Administered 2013-05-25 – 2013-05-26 (×2): 60 mg via ORAL
  Filled 2013-05-24 (×3): qty 1

## 2013-05-24 MED ORDER — LISINOPRIL 10 MG PO TABS
10.0000 mg | ORAL_TABLET | Freq: Every day | ORAL | Status: DC
  Administered 2013-05-24 – 2013-05-26 (×3): 10 mg via ORAL
  Filled 2013-05-24 (×4): qty 1

## 2013-05-24 MED ORDER — LEVALBUTEROL HCL 1.25 MG/0.5ML IN NEBU
1.2500 mg | INHALATION_SOLUTION | Freq: Four times a day (QID) | RESPIRATORY_TRACT | Status: DC
  Administered 2013-05-24 – 2013-05-25 (×4): 1.25 mg via RESPIRATORY_TRACT
  Filled 2013-05-24 (×8): qty 0.5

## 2013-05-24 NOTE — Progress Notes (Signed)
PT refused her cpap, tried to talk her into wearing it. But she said no

## 2013-05-24 NOTE — Progress Notes (Signed)
[facility] PROGRESS NOTE  Stephanie Schultz ZOX:[id number] DOB: 1956/04/19 DOA: 05/21/2013 PCP: Lonia Blood, MD  Assessment/Plan: 1. COPD exacerbation - Continue steroids: will decrease to prednisone 60 mg po daily (was taking bid), symbicort, albuterol, and doxycycline. Patient feels improvement on this regimen. - Will try to wean patient back to home regimen of supplemental oxygen (2L) - Given elevated heart rate will change albuterol to xopenex  2.  HTN - No antihypertensive medication on board - place on low sodium diet - start lisinopril  3. GERD - Protonix on board currently stable  4. DM 2 - On diabetic diet - continue routine CBG's - Continue SSI - Patient on metformin with no plans to obtain CT scan with contrast I will continue currently.  Code Status: full Family Communication: no family at bedside Disposition Plan: Pending improvement in respiratory rate   Consultants:  None  Procedures:  None  Antibiotics:  doxycycline  HPI/Subjective: Patient feels better currently.  No new complaints.  Objective: Filed Vitals:   05/24/13 0600 05/24/13 0720 05/24/13 0947 05/24/13 1323  BP: 160/85  142/75 151/66  Pulse: 105  107 111  Temp: 98.1 F (36.7 C)  97.7 F (36.5 C) 98.5 F (36.9 C)  TempSrc: Oral  Oral Oral  Resp: 20  20 22   Height:      Weight:      SpO2: 95% 99% 96% 97%    Intake/Output Summary (Last 24 hours) at 05/24/13 1523 Last data filed at 05/24/13 1324  Gross per 24 hour  Intake   1320 ml  Output      0 ml  Net   1320 ml   Filed Weights   05/22/13 0200  Weight: 99.4 kg (219 lb 2.2 oz)    Exam:   General:  Pt in NAD, alert and oriented  Cardiovascular: RRR, no MRG  Respiratory: prolonged expiratory phase, mild expiratory wheeze  Abdomen: soft, NT, ND  Musculoskeletal: no cyanosis or clubbing   Data Reviewed: Basic Metabolic Panel:  Recent Labs Lab 05/22/13 0045  NA 139  K 4.3  CL 93*  CO2 36*  GLUCOSE 198*   BUN 18  CREATININE 0.88  CALCIUM 9.9   Liver Function Tests: No results found for this basename: AST, ALT, ALKPHOS, BILITOT, PROT, ALBUMIN,  in the last 168 hours No results found for this basename: LIPASE, AMYLASE,  in the last 168 hours No results found for this basename: AMMONIA,  in the last 168 hours CBC:  Recent Labs Lab 05/22/13 0045 05/24/13 0600  WBC 14.6* 16.8*  NEUTROABS 13.1*  --   HGB 14.4 12.8  HCT 41.6 37.0  MCV 85.6 85.6  PLT 256 228   Cardiac Enzymes: No results found for this basename: CKTOTAL, CKMB, CKMBINDEX, TROPONINI,  in the last 168 hours BNP (last 3 results)  Recent Labs  01/17/13 1410 05/14/13 0230  PROBNP 103.2 46.2   CBG:  Recent Labs Lab 05/23/13 1151 05/23/13 1645 05/23/13 2131 05/24/13 0746 05/24/13 1147  GLUCAP 104* 252* 347* 163* 212*    Recent Results (from the past 240 hour(s))  URINE CULTURE     Status: None   Collection Time    05/16/13  3:37 AM      Result Value Range Status   Specimen Description URINE, CLEAN CATCH   Final   Special Requests NONE   Final   Culture  Setup Time 05/16/2013 15:32   Final   Colony Count 55,000 COLONIES/ML   Final   Culture  Final   Value: Multiple bacterial morphotypes present, none predominant. Suggest appropriate recollection if clinically indicated.   Report Status 05/17/2013 FINAL   Final     Studies: No results found.  Scheduled Meds: . albuterol  2.5 mg Nebulization Q4H  . aspirin EC  81 mg Oral Daily  . budesonide-formoterol  2 puff Inhalation BID  . cyanocobalamin  500 mcg Oral Daily  . dextromethorphan  30 mg Oral BID  . diazepam  10 mg Oral QHS  . docusate sodium  100 mg Oral BID  . doxycycline  100 mg Oral Q12H  . fluticasone  2 spray Each Nare BID  . guaiFENesin  600 mg Oral BID  . heparin  5,000 Units Subcutaneous Q8H  . insulin aspart  0-20 Units Subcutaneous TID WC  . insulin aspart  0-5 Units Subcutaneous QHS  . ipratropium  0.5 mg Nebulization QID  .  loratadine  10 mg Oral Daily  . metFORMIN  500 mg Oral Q breakfast  . pantoprazole  40 mg Oral Daily  . [START ON 05/25/2013] predniSONE  60 mg Oral Daily   Continuous Infusions:   Principal Problem:   COPD exacerbation Active Problems:   Hypertension   GERD (gastroesophageal reflux disease)   Hoarseness   Acute and chronic respiratory failure   Tobacco abuse   Morbid obesity   DM type 2 (diabetes mellitus, type 2)   Anxiety    Time spent: > 35 minutes    [name]  [facility] Pager [phone]. If 7PM-7AM, please contact night-coverage at [email or web address], password [facility] 05/24/2013, 3:23 PM  LOS: 3 days

## 2013-05-24 NOTE — Progress Notes (Signed)
Patient continues non compliance with ordered care path removing oxygen, refusing to wear cpap, and eating home supplied snacks.  Patient counseled on effective of concentrated starchy sweets on diabetes and cpap use for effective sleep.  Patient given evening valium and has slept off and on in between encounters by care team and bathroom use.

## 2013-05-25 LAB — BASIC METABOLIC PANEL
BUN: 20 mg/dL (ref 6–23)
CO2: 38 mEq/L — ABNORMAL HIGH (ref 19–32)
Calcium: 9.8 mg/dL (ref 8.4–10.5)
GFR calc non Af Amer: 61 mL/min — ABNORMAL LOW (ref >90)
Glucose, Bld: 129 mg/dL — ABNORMAL HIGH (ref 70–99)

## 2013-05-25 LAB — CBC
Hemoglobin: 12.2 g/dL (ref 12.0–15.0)
MCH: 29.2 pg (ref 26.0–34.0)
MCHC: 33.7 g/dL (ref 30.0–36.0)
MCV: 86.6 fL (ref 78.0–100.0)

## 2013-05-25 LAB — TROPONIN I: Troponin I: <0.3 ng/mL (ref <0.30)

## 2013-05-25 LAB — GLUCOSE, CAPILLARY
Glucose-Capillary: 184 mg/dL — ABNORMAL HIGH (ref 70–99)
Glucose-Capillary: 148 mg/dL — ABNORMAL HIGH (ref 70–99)

## 2013-05-25 MED ORDER — PANTOPRAZOLE SODIUM 40 MG PO TBEC
40.0000 mg | DELAYED_RELEASE_TABLET | Freq: Two times a day (BID) | ORAL | Status: DC
  Administered 2013-05-25 – 2013-05-26 (×2): 40 mg via ORAL
  Filled 2013-05-25 (×5): qty 1

## 2013-05-25 NOTE — Progress Notes (Signed)
Spoke to Dr. Cena Benton informed that before am meds given HR 119, but patient up to bathroom without O2 and O2 replaced. Md states to continue to monitor and he will likely discharge tomorrow.

## 2013-05-25 NOTE — Progress Notes (Signed)
Paged Dr. Cena Benton that EKG results avail in computer

## 2013-05-25 NOTE — Progress Notes (Signed)
Inpatient Diabetes Program Recommendations  AACE/ADA: New Consensus Statement on Inpatient Glycemic Control (2013)  Target Ranges:  Prepandial:   less than 140 mg/dL      Peak postprandial:   less than 180 mg/dL (1-2 hours)      Critically ill patients:  140 - 180 mg/dL   Reason for Visit: Hyperglycemia Results for Stephanie Schultz, Stephanie Schultz (MRN [id number]) as of 05/25/2013 12:02  Ref. Range 05/24/2013 11:47 05/24/2013 16:49 05/24/2013 22:31 05/25/2013 07:40  Glucose-Capillary Latest Range: 70-99 mg/dL 629 (H) 528 (H) 413 (H) 118 (H)     Inpatient Diabetes Program Recommendations Correction (SSI): . HgbA1C: Need updated HgbA1C to assess glycemic control prior to hospitalization  Note: Will follow.  Thank you. Ailene Ards, RD, LDN, CDE Inpatient Diabetes Coordinator [phone]

## 2013-05-25 NOTE — Progress Notes (Signed)
[facility] PROGRESS NOTE  Stephanie Schultz ZOX:[id number] DOB: [birth year] DOA: 05/21/2013 PCP: Lonia Blood, MD  Assessment/Plan: 1. COPD exacerbation - Continue steroids: will decrease to prednisone 60 mg po daily (was taking bid), symbicort, albuterol, and doxycycline. Patient feels improvement on this regimen. - Will try to wean patient back to home regimen of supplemental oxygen (2L) - Patient still tachycardic, will obtain ekg.    2.  HTN - No antihypertensive medication on board - place on low sodium diet - start lisinopril and creatinine 1.02 with improved BP with last recorded at 127/75  3. GERD - Protonix on board currently stable  4. DM 2 - On diabetic diet - continue routine CBG's - Continue SSI - Patient on metformin, currently with no plans to obtain CT scan with contrast as such I will continue.  Code Status: full Family Communication: no family at bedside Disposition Plan: Pending improvement in respiratory rate   Consultants:  None  Procedures:  None  Antibiotics:  doxycycline  HPI/Subjective: Patient feels better currently.  No new complaints.  Objective: Filed Vitals:   05/24/13 2305 05/25/13 0519 05/25/13 0833 05/25/13 1032  BP:  147/75  127/75  Pulse: 111 89  119  Temp:  97.8 F (36.6 C)    TempSrc:  Oral    Resp:  18    Height:      Weight:      SpO2: 92% 100% 96%     Intake/Output Summary (Last 24 hours) at 05/25/13 1310 Last data filed at 05/25/13 0600  Gross per 24 hour  Intake    720 ml  Output      2 ml  Net    718 ml   Filed Weights   05/22/13 0200  Weight: 99.4 kg (219 lb 2.2 oz)    Exam:   General:  Pt in NAD, alert and oriented  Cardiovascular: RRR, no MRG  Respiratory: prolonged expiratory phase, mild expiratory wheeze  Abdomen: soft, NT, ND  Musculoskeletal: no cyanosis or clubbing   Data Reviewed: Basic Metabolic Panel:  Recent Labs Lab 05/22/13 0045 05/25/13 0518  NA 139 144  K 4.3 3.9  CL  93* 100  CO2 36* 38*  GLUCOSE 198* 129*  BUN 18 20  CREATININE 0.88 1.02  CALCIUM 9.9 9.8   Liver Function Tests: No results found for this basename: AST, ALT, ALKPHOS, BILITOT, PROT, ALBUMIN,  in the last 168 hours No results found for this basename: LIPASE, AMYLASE,  in the last 168 hours No results found for this basename: AMMONIA,  in the last 168 hours CBC:  Recent Labs Lab 05/22/13 0045 05/24/13 0600 05/25/13 0518  WBC 14.6* 16.8* 11.4*  NEUTROABS 13.1*  --   --   HGB 14.4 12.8 12.2  HCT 41.6 37.0 36.2  MCV 85.6 85.6 86.6  PLT 256 228 198   Cardiac Enzymes: No results found for this basename: CKTOTAL, CKMB, CKMBINDEX, TROPONINI,  in the last 168 hours BNP (last 3 results)  Recent Labs  01/17/13 1410 05/14/13 0230  PROBNP 103.2 46.2   CBG:  Recent Labs Lab 05/24/13 1147 05/24/13 1649 05/24/13 2231 05/25/13 0740 05/25/13 1202  GLUCAP 212* 235* 139* 118* 222*    Recent Results (from the past 240 hour(s))  URINE CULTURE     Status: None   Collection Time    05/16/13  3:37 AM      Result Value Range Status   Specimen Description URINE, CLEAN CATCH   Final   Special Requests  NONE   Final   Culture  Setup Time 05/16/2013 15:32   Final   Colony Count 55,000 COLONIES/ML   Final   Culture     Final   Value: Multiple bacterial morphotypes present, none predominant. Suggest appropriate recollection if clinically indicated.   Report Status 05/17/2013 FINAL   Final     Studies: No results found.  Scheduled Meds: . aspirin EC  81 mg Oral Daily  . budesonide-formoterol  2 puff Inhalation BID  . cyanocobalamin  500 mcg Oral Daily  . dextromethorphan  30 mg Oral BID  . diazepam  10 mg Oral QHS  . docusate sodium  100 mg Oral BID  . doxycycline  100 mg Oral Q12H  . fluticasone  2 spray Each Nare BID  . guaiFENesin  600 mg Oral BID  . heparin  5,000 Units Subcutaneous Q8H  . insulin aspart  0-20 Units Subcutaneous TID WC  . insulin aspart  0-5 Units  Subcutaneous QHS  . ipratropium  0.5 mg Nebulization QID  . levalbuterol  1.25 mg Nebulization Q6H  . lisinopril  10 mg Oral Daily  . loratadine  10 mg Oral Daily  . metFORMIN  500 mg Oral Q breakfast  . pantoprazole  40 mg Oral Daily  . predniSONE  60 mg Oral Q breakfast   Continuous Infusions:   Principal Problem:   COPD exacerbation Active Problems:   Hypertension   GERD (gastroesophageal reflux disease)   Hoarseness   Acute and chronic respiratory failure   Tobacco abuse   Morbid obesity   DM type 2 (diabetes mellitus, type 2)   Anxiety    Time spent: > 35 minutes    [name]  [facility] Pager [phone]. If 7PM-7AM, please contact night-coverage at [email or web address], password [facility] 05/25/2013, 1:10 PM  LOS: 4 days

## 2013-05-25 NOTE — Progress Notes (Signed)
Pt stated that she does not want to wear cpap tonight.  Pt was advised that RT is available all night should she change her mind.  RN aware.

## 2013-05-25 NOTE — Progress Notes (Addendum)
Received referral from COPD [facility] program to evaluate for [facility] Care Management services. Spoke with Stephanie Schultz to explain [facility] Care Management services. Consents signed. States she lives alone but has an Engineer, production that comes 1-2 hrs a day thru [employer] out of [place]. Reports she uses [employer] for transportation to MD appointments. However, has to borrow money sometimes to pay for her medications. Discussed with patient at bedside that [facility] Care Management will follow up with her post discharge and will assess for the need for monthly visits. Confirmed best contact number to reach her. Left packet and contact information at bedside. Will update inpatient RNCM about bedside conversation.   [name], MSN- Ed, Charity fundraiser, BSN-- [facility] [phone]

## 2013-05-26 ENCOUNTER — Ambulatory Visit: Payer: Medicaid Other | Admitting: Emergency Medicine

## 2013-05-26 DIAGNOSIS — I5032 Chronic diastolic (congestive) heart failure: Secondary | ICD-10-CM

## 2013-05-26 MED ORDER — PREDNISONE 10 MG PO TABS: 10.0000 mg | ORAL_TABLET | ORAL | Status: DC

## 2013-05-26 MED ORDER — IPRATROPIUM BROMIDE 0.02 % IN SOLN
0.5000 mg | Freq: Four times a day (QID) | RESPIRATORY_TRACT | Status: DC
  Administered 2013-05-26 (×3): 0.5 mg via RESPIRATORY_TRACT
  Filled 2013-05-26 (×3): qty 2.5

## 2013-05-26 MED ORDER — DOXYCYCLINE HYCLATE 100 MG PO TABS: 100.0000 mg | ORAL_TABLET | Freq: Two times a day (BID) | ORAL | Status: DC

## 2013-05-26 MED ORDER — LEVALBUTEROL HCL 1.25 MG/0.5ML IN NEBU
1.2500 mg | INHALATION_SOLUTION | Freq: Four times a day (QID) | RESPIRATORY_TRACT | Status: DC
  Administered 2013-05-26 (×3): 1.25 mg via RESPIRATORY_TRACT
  Filled 2013-05-26 (×6): qty 0.5

## 2013-05-26 NOTE — Discharge Summary (Signed)
Physician Discharge Summary  Stephanie Schultz ZOX:[id number] DOB: 04/30/1956 DOA: 05/21/2013  PCP: Lonia Blood, MD  Admit date: 05/21/2013 Discharge date: 05/26/2013  Time spent: > 35 minutes  Recommendations for Outpatient Follow-up:  1. Please be sure to follow up with your primary care physician or pulmonologist in 1-2 weeks 2. Assess blood pressures and adjust antihypertensive regimen 3. Decide whether or not patient will require a more prolonged prednisone course.  Discharge Diagnoses:  Principal Problem:   COPD exacerbation Active Problems:   Hypertension   GERD (gastroesophageal reflux disease)   Hoarseness   Acute and chronic respiratory failure   Tobacco abuse   Morbid obesity   DM type 2 (diabetes mellitus, type 2)   Anxiety   Discharge Condition: stable  Diet recommendation: low sodium heart healthy  Filed Weights   05/22/13 0200  Weight: 99.4 kg (219 lb 2.2 oz)    History of present illness:  57 y/o with h/o chronic respiratory failure and copd that presented to the ED complaining of SOB.   Hospital Course:  1. COPD exacerbation - Continue steroids: will plan on sending patient home on prednisone taper - continue doxycycline to complete a total of 7 days of antibiotic therapy - continue home regimen   - Tachycardia was sinus rhythm on EKG. HR has fluctuated from 107-87 on day of discharge  2. HTN  - Will place back on her metoprolol on discharge   3. GERD  - stable will plan on continuing home regimen once patient discharged.  4. DM 2  - diabetic diet - patient to continue home regimen  Procedures:  None  Consultations:  none  Discharge Exam: Filed Vitals:   05/26/13 0555 05/26/13 0907 05/26/13 1007 05/26/13 1416  BP: 154/69  119/68 149/73  Pulse: 98  87 107  Temp: 98.6 F (37 C)  98.3 F (36.8 C) 98.4 F (36.9 C)  TempSrc: Oral  Oral Oral  Resp: 20  20 20   Height:      Weight:      SpO2: 100% 100% 96% 96%    General: Pt in NAD,  Alert and awake Cardiovascular: RRR, no MRG Respiratory: CTA BL, no wheezes, prolonged expiratory phase.  Discharge Instructions  Discharge Orders   Future Orders Complete By Expires     Call MD for:  difficulty breathing, headache or visual disturbances  As directed     Call MD for:  temperature >100.4  As directed     Diet - low sodium heart healthy  As directed     Discharge instructions  As directed     Comments:      Discharge to home with home health. F/u with your pulmonologist or primary care physician in 1-2 weeks or sooner should any new concerns arise.    Increase activity slowly  As directed         Medication List         albuterol 108 (90 BASE) MCG/ACT inhaler  Commonly known as:  PROVENTIL HFA;VENTOLIN HFA  Inhale 2 puffs into the lungs every 4 (four) hours as needed for wheezing.     albuterol (2.5 MG/3ML) 0.083% nebulizer solution  Commonly known as:  PROVENTIL  - Take 3 mLs (2.5 mg total) by nebulization 4 (four) times daily. DX: 496.  - For wheezing     aspirin EC 81 MG tablet  Take 81 mg by mouth daily.     budesonide-formoterol 160-4.5 MCG/ACT inhaler  Commonly known as:  SYMBICORT  Inhale 2  puffs into the lungs 2 (two) times daily.     cyclobenzaprine 10 MG tablet  Commonly known as:  FLEXERIL  Take 10 mg by mouth 2 (two) times daily as needed for muscle spasms.     diazepam 10 MG tablet  Commonly known as:  VALIUM  Take 10 mg by mouth at bedtime.     doxycycline 100 MG tablet  Commonly known as:  VIBRA-TABS  Take 1 tablet (100 mg total) by mouth every 12 (twelve) hours.     fluticasone 50 MCG/ACT nasal spray  Commonly known as:  FLONASE  Place 2 sprays into the nose 2 (two) times daily.     guaiFENesin 600 MG 12 hr tablet  Commonly known as:  MUCINEX  Take 1 tablet (600 mg total) by mouth 2 (two) times daily.     ipratropium 0.02 % nebulizer solution  Commonly known as:  ATROVENT  Take 2.5 mLs (0.5 mg total) by nebulization 4 (four)  times daily.     loratadine 10 MG tablet  Commonly known as:  CLARITIN  Take 10 mg by mouth daily.     metFORMIN 500 MG tablet  Commonly known as:  GLUCOPHAGE  Take 1 tablet (500 mg total) by mouth daily with breakfast.     metoprolol tartrate 12.5 mg Tabs  Commonly known as:  LOPRESSOR  Take 0.5 tablets (12.5 mg total) by mouth 2 (two) times daily.     omeprazole 20 MG capsule  Commonly known as:  PRILOSEC  Take 20 mg by mouth 2 (two) times daily.     oxycodone 5 MG capsule  Commonly known as:  OXY-IR  Take 5 mg by mouth every 6 (six) hours as needed for pain.     predniSONE 10 MG tablet  Commonly known as:  DELTASONE  Take 1 tablet (10 mg total) by mouth as directed. Please take 40 mg po daily for the next 4 days, then take 20 mg po daily for the next 4 days, then take 10 mg po daily for the next 4 days     tiotropium 18 MCG inhalation capsule  Commonly known as:  SPIRIVA  Place 18 mcg into inhaler and inhale daily.     vitamin B-12 500 MCG tablet  Commonly known as:  CYANOCOBALAMIN  Take 500 mcg by mouth daily.       No Known Allergies    The results of significant diagnostics from this hospitalization (including imaging, microbiology, ancillary and laboratory) are listed below for reference.    Significant Diagnostic Studies: X-ray Chest Pa And Lateral   04/27/2013   *RADIOLOGY REPORT*  Clinical Data: Acute shortness of breath.  CHEST - 2 VIEW  Comparison: Chest x-ray 04/26/2013.  Findings: Lung volumes are normal.  No consolidative airspace disease.  No pleural effusions.  No pneumothorax.  No pulmonary nodule or mass noted.  Pulmonary vasculature and the cardiomediastinal silhouette are within normal limits. Atherosclerosis in the thoracic aorta.  IMPRESSION: 1. No radiographic evidence of acute cardiopulmonary disease. 2.  Atherosclerosis.   Original Report Authenticated By: Trudie Reed, M.D.   US Abdomen Complete  05/10/2013   *RADIOLOGY REPORT*  Clinical  Data:  Right upper quadrant pain  ABDOMINAL ULTRASOUND COMPLETE  Comparison:  01/15/2013  Findings:  Gallbladder:  No gallstones, gallbladder wall thickening, or pericholecystic fluid.  Common Bile Duct:  Within normal limits in caliber.  Liver: There is diffusely increased echogenicity without focal mass.  IVC:  Appears normal.  Pancreas:  The  pancreas was poorly visualized.  No obvious mass.  Spleen:  Within normal limits in size and echotexture.  Right kidney:  Normal in size and parenchymal echogenicity.  No evidence of mass or hydronephrosis.  Left kidney:  Normal in size and parenchymal echogenicity.  No evidence of mass or hydronephrosis.  Abdominal Aorta:  Portions of the aorta were obscured.  No obvious aneurysm.  IMPRESSION: Limited visualization of the pancreas and aorta.  Probable diffuse hepatic steatosis.  Stable examination.   Original Report Authenticated By: Jolaine Click, M.D.   Dg Chest Port 1 View  05/21/2013   *RADIOLOGY REPORT*  Clinical Data: Shortness of breath.  COPD.  PORTABLE CHEST - 1 VIEW  Comparison: 05/13/2013  Findings: Shallow inspiration.  Heart size and pulmonary vascularity are normal for technique and stable since previous study.  No focal consolidation or airspace disease.  No blunting of costophrenic angles.  No pneumothorax.  Mediastinal contours appear intact.  Calcification of the aorta.  IMPRESSION: No evidence of active pulmonary disease.  No significant change since previous study.   Original Report Authenticated By: Burman Nieves, M.D.   Dg Chest Portable 1 View  05/13/2013   *RADIOLOGY REPORT*  Clinical Data: Shortness of breath and cough.  PORTABLE CHEST - 1 VIEW  Comparison: Plain films of the chest 05/08/2013.  Findings: The lungs are clear.  Heart size is normal.  No pneumothorax or pleural fluid.  Study is somewhat limited by the patient's body habitus and portable technique.  IMPRESSION: No acute abnormality.   Original Report Authenticated By: Holley Dexter, M.D.   Dg Chest Port 1 View  04/26/2013   *RADIOLOGY REPORT*  Clinical Data: Shortness of breath  PORTABLE CHEST - 1 VIEW  Comparison: 04/13/2013  Findings: Cardiomediastinal silhouette is stable.  No acute infiltrate or pleural effusion.  No pulmonary edema.  Stable chronic mild interstitial prominence.  IMPRESSION: No active disease.  No significant change.   Original Report Authenticated By: Natasha Mead, M.D.    Microbiology: No results found for this or any previous visit (from the past 240 hour(s)).   Labs: Basic Metabolic Panel:  Recent Labs Lab 05/22/13 0045 05/25/13 0518  NA 139 144  K 4.3 3.9  CL 93* 100  CO2 36* 38*  GLUCOSE 198* 129*  BUN 18 20  CREATININE 0.88 1.02  CALCIUM 9.9 9.8   Liver Function Tests: No results found for this basename: AST, ALT, ALKPHOS, BILITOT, PROT, ALBUMIN,  in the last 168 hours No results found for this basename: LIPASE, AMYLASE,  in the last 168 hours No results found for this basename: AMMONIA,  in the last 168 hours CBC:  Recent Labs Lab 05/22/13 0045 05/24/13 0600 05/25/13 0518  WBC 14.6* 16.8* 11.4*  NEUTROABS 13.1*  --   --   HGB 14.4 12.8 12.2  HCT 41.6 37.0 36.2  MCV 85.6 85.6 86.6  PLT 256 228 198   Cardiac Enzymes:  Recent Labs Lab 05/25/13 1430  TROPONINI <0.30   BNP: BNP (last 3 results)  Recent Labs  01/17/13 1410 05/14/13 0230  PROBNP 103.2 46.2   CBG:  Recent Labs Lab 05/25/13 1202 05/25/13 1717 05/25/13 2149 05/26/13 0738 05/26/13 1130  GLUCAP 222* 184* 148* 134* 207*       Signed:  [name]  [facility] 05/26/2013, 3:39 PM

## 2013-06-12 ENCOUNTER — Ambulatory Visit (INDEPENDENT_AMBULATORY_CARE_PROVIDER_SITE_OTHER): Payer: Medicaid Other | Admitting: Adult Health

## 2013-06-12 ENCOUNTER — Ambulatory Visit: Payer: Medicaid Other | Admitting: Emergency Medicine

## 2013-06-12 ENCOUNTER — Encounter: Payer: Self-pay | Admitting: Adult Health

## 2013-06-12 VITALS — BP 140/90 | HR 88 | Temp 97.9°F | Wt 225.8 lb

## 2013-06-12 DIAGNOSIS — G4733 Obstructive sleep apnea (adult) (pediatric): Secondary | ICD-10-CM

## 2013-06-12 DIAGNOSIS — J441 Chronic obstructive pulmonary disease with (acute) exacerbation: Secondary | ICD-10-CM

## 2013-06-12 NOTE — Patient Instructions (Addendum)
Continue on Symbicort, and Spiriva, make sure you brush rinse and gargle after inhaler use. Work on stopping smoking.-set a quit date - Engineer, maintenance day would be a great idea "  Restart CPAP at nighttime. We will send an order for new nasal pillows .  Follow with Dr. Delton Coombes in 6 weeks and as needed

## 2013-06-12 NOTE — Progress Notes (Signed)
Subjective:    Patient ID: Stephanie Schultz, female    DOB: 11-12-1956, 57 y.o.   MRN: [id number]  HPI 57 yo smoker (90 pk-yrs, has cut down to about 2 cig a day), hx HTN, borderline DM, allergies. Dx with COPD at [facility] in [place]. She was started on O2 in ~2009. She had PFT within the last year. She remains fairly active as long as she wears her O2. She can walk about 30-40 feet, has to stop when shopping to rest. She has daily cough, productive of white phlegm. She has rare exacerbations, last was in Jan 2012.   ROV 07/05/11 -- COPD, OSA. Her data from [facility] showed she needs CPAP 10. We ordered O2 thru [facility]. She continues to smoke 3 cigs a day. She has been coughing more, having more sputum - yellow/white sputum.  She is having more dyspnea, more wheezing x 3 weeks. She has gained 30+ lbs since coming to [place]. She has not been wearing her O2 reliably.   ROV 09/20/11 -- COPD, OSA. Returns for f/u. Tells me that she has been rx for COPD exacerbation x 2 since our last visit. She tells me that her CXR showed "a spot on one of her lungs". Was done at [facility] ER on 08/23/11. She was treated with pred and azithro on each occasion. She is on Advair and Spiriva. Continues to have severe cough, costochondritic pain. The pred and azithro helped some but not completely. Ran out loratadine 2 weeks ago. Still on omeprazole bid. She is wearing CPAP 10 reliably.   ROV 10/25/11 -- COPD, OSA on CPAP 10. Last time we started [facility] in place of Advair to see if this helped w UA sx. Still on Spiriva. Restarted loratadine and continued omeprazole bid. She feels that her breathing is the same, cough is a bit better on the [facility]. No flares, but she is still having nasal gtt despite the loratadine.   12/17/2011 Acute OV  Pt presents for an acute office visit, complains of increased SOB, wheezing, prod cough with yellow-to-brown mucus  2 weeks . Went to ED on 1.23.13 and was given zpak and prednisone 50mg  x 4 days. CXR with  chronic changing. Has finished abx and steroids. Got some better but cough never went away. Still smoking 1/2 PPD (takes O2 off -goes outside to smoke) .  Remains on Dulera and Spiriva  With no missed doses.  OTC not helping.  Wants referral for home health.  Encouraged to quit smoking.   ROV 02/18/12 -- COPD, OSA on CPAP, chronic cough. Had an AE with residual cough as described above 2/13. Remains on Spiriva + Dulera. Remains on omeprazole bid, flonase, not using loratadine right now. Still smoking about 3 cig a day. She is interested in trying the nicotine patches  ROV 07/08/12 -- COPD, OSA on CPAP, chronic cough. She reports that she quit smoking July 3! She has gained 60 lbs over the last year. Her breathing is stable, still limited. She is wearing her O2 and her CPAP. Continues to take Spiriva + Dulera. She reports that she is still having GERD sx even on omeprazole bid. She ran out of flonase 1 month ago. She has had some epistaxis on R.   ROV 11/25/12 -- COPD, OSA on CPAP, chronic cough. Since last time she restarted smoking - currently at 5 cig a day. She ran out of her Elwin Sleight a week ago. Still taking spiriva. She has been having more cough over about  2 weeks. She has been using albuterol nebs bid - about to run out of it, need refill. She remains on loratadine, fluticasone, omeprazole. She hasn't been using CPAP regularly - needs new mask, this is being arranged. She is to undergo breast bx for a nodule this week.    Post Hospital follow up 12/19/12 Patient returns for post hospital followup. Patient was admitted January 24-27th 2014 for COPD, exacerbation. She was treated with IV antibiotics, nebulized bronchodilators, and IV steroids.  She was discharged on a steroid taper, along with Levaquin.  Since discharge. She has had  some improvement however, continues to have productive cough, with some thick, yellow. Mucus. Unfortunately patient has restarted smoking. Smoking cessation education was  given. Chest x-ray today shows no acute process Patient denies any hemoptysis, orthopnea, PND, or leg swelling.   03/11/13 -- COPD, OSA on CPAP, chronic cough, tobacco use. Admitted 3/6 - 3/11 for AE-COPD. She feels better. She is having some nasal congestion, on loratadine + nasal steroid. ENT saw her and increased her PPI. She is wearing her CPAP.   06/12/2013 ER Follow up pt reports breathing is doing much better since last ED visit 7/10 w SOB given pred and abx but did not take abx-- wearing CPAP 1-2 nights per week-- states it makes her feel claustrophobic-- denies any other concerns at this time Cough and congestion have improved. Shortness, of breath is also decreased. Patient denies any hemoptysis, orthopnea, PND, or increased leg swelling  ROS  See history of present illness   Objective:   Physical Exam  Gen: Pleasant, obese, in no distress,  normal affect  ENT: No lesions,  mouth clear,  oropharynx clear, no postnasal drip  Neck: No JVD, no TMG, no carotid bruits  Lungs: No use of accessory muscles, distant, coarse BS , no wheezes   Cardiovascular: RRR, heart sounds normal, no murmur or gallops, no peripheral edema  Musculoskeletal: No deformities, no cyanosis or clubbing  Neuro: alert, non focal  Skin: Warm, no lesions or rashes, old scars on B UE's   Assessment & Plan:

## 2013-06-15 ENCOUNTER — Other Ambulatory Visit: Payer: Self-pay | Admitting: Emergency Medicine

## 2013-06-17 NOTE — Assessment & Plan Note (Signed)
Flare   Plan  Continue on Symbicort, and Spiriva, make sure you brush rinse and gargle after inhaler use. Work on stopping smoking.-set a quit date - Engineer, maintenance day would be a great idea "  Follow with Dr. Delton Coombes in 6 weeks and as needed

## 2013-06-17 NOTE — Assessment & Plan Note (Signed)
Restart CPAP at nighttime. We will send an order for new nasal pillows .  Follow with Dr. Delton Coombes in 6 weeks and as needed

## 2013-06-18 ENCOUNTER — Telehealth: Payer: Self-pay | Admitting: Emergency Medicine

## 2013-06-18 NOTE — Telephone Encounter (Signed)
LMTCB x1 for Efraim Kaufmann to see what is going on

## 2013-06-18 NOTE — Telephone Encounter (Signed)
Melissa returned call. Please call back at [phone]

## 2013-06-18 NOTE — Telephone Encounter (Signed)
LMOM x 2 

## 2013-06-19 NOTE — Telephone Encounter (Signed)
Per Melissa. Pt received a new mask w/in the past 6 months (not sure exact date). Medicaid will not cover another mask/nasal pillows until it is due for her again. If she wants the nasal pillows then she would have to pay out of pocket for this. Melissa stated they have already explained this to pt.   LMTCB x1 for pt

## 2013-06-19 NOTE — Telephone Encounter (Signed)
LMTCB for [facility]

## 2013-06-19 NOTE — Telephone Encounter (Signed)
Spoke with the pt and notified her of the below recs and she verbalized understanding  Nothing further needed per pt

## 2013-07-05 ENCOUNTER — Emergency Department (HOSPITAL_COMMUNITY)
Admission: EM | Admit: 2013-07-05 | Discharge: 2013-07-06 | Disposition: A | Discharge status: Home / Self Care | Payer: Medicaid Other | Attending: Emergency Medicine | Admitting: Emergency Medicine

## 2013-07-05 ENCOUNTER — Emergency Department (HOSPITAL_COMMUNITY): Payer: Medicaid Other

## 2013-07-05 ENCOUNTER — Encounter (HOSPITAL_COMMUNITY): Payer: Self-pay | Admitting: *Deleted

## 2013-07-05 DIAGNOSIS — R05 Cough: Secondary | ICD-10-CM | POA: Insufficient documentation

## 2013-07-05 DIAGNOSIS — I10 Essential (primary) hypertension: Secondary | ICD-10-CM | POA: Insufficient documentation

## 2013-07-05 DIAGNOSIS — Z9861 Coronary angioplasty status: Secondary | ICD-10-CM | POA: Insufficient documentation

## 2013-07-05 DIAGNOSIS — IMO0002 Reserved for concepts with insufficient information to code with codable children: Secondary | ICD-10-CM | POA: Insufficient documentation

## 2013-07-05 DIAGNOSIS — Z7982 Long term (current) use of aspirin: Secondary | ICD-10-CM | POA: Insufficient documentation

## 2013-07-05 DIAGNOSIS — Z8669 Personal history of other diseases of the nervous system and sense organs: Secondary | ICD-10-CM | POA: Insufficient documentation

## 2013-07-05 DIAGNOSIS — K219 Gastro-esophageal reflux disease without esophagitis: Secondary | ICD-10-CM | POA: Insufficient documentation

## 2013-07-05 DIAGNOSIS — G4733 Obstructive sleep apnea (adult) (pediatric): Secondary | ICD-10-CM | POA: Insufficient documentation

## 2013-07-05 DIAGNOSIS — Z9981 Dependence on supplemental oxygen: Secondary | ICD-10-CM | POA: Insufficient documentation

## 2013-07-05 DIAGNOSIS — Z8541 Personal history of malignant neoplasm of cervix uteri: Secondary | ICD-10-CM | POA: Insufficient documentation

## 2013-07-05 DIAGNOSIS — F411 Generalized anxiety disorder: Secondary | ICD-10-CM | POA: Insufficient documentation

## 2013-07-05 DIAGNOSIS — J441 Chronic obstructive pulmonary disease with (acute) exacerbation: Secondary | ICD-10-CM

## 2013-07-05 DIAGNOSIS — R0789 Other chest pain: Secondary | ICD-10-CM | POA: Insufficient documentation

## 2013-07-05 DIAGNOSIS — F17200 Nicotine dependence, unspecified, uncomplicated: Secondary | ICD-10-CM | POA: Insufficient documentation

## 2013-07-05 DIAGNOSIS — E119 Type 2 diabetes mellitus without complications: Secondary | ICD-10-CM | POA: Insufficient documentation

## 2013-07-05 DIAGNOSIS — R112 Nausea with vomiting, unspecified: Secondary | ICD-10-CM | POA: Insufficient documentation

## 2013-07-05 DIAGNOSIS — D571 Sickle-cell disease without crisis: Secondary | ICD-10-CM | POA: Insufficient documentation

## 2013-07-05 DIAGNOSIS — Z79899 Other long term (current) drug therapy: Secondary | ICD-10-CM | POA: Insufficient documentation

## 2013-07-05 DIAGNOSIS — Z8701 Personal history of pneumonia (recurrent): Secondary | ICD-10-CM | POA: Insufficient documentation

## 2013-07-05 DIAGNOSIS — R059 Cough, unspecified: Secondary | ICD-10-CM | POA: Insufficient documentation

## 2013-07-05 DIAGNOSIS — Z8709 Personal history of other diseases of the respiratory system: Secondary | ICD-10-CM | POA: Insufficient documentation

## 2013-07-05 LAB — POCT I-STAT TROPONIN I: Troponin i, poc: 0.01 ng/mL (ref 0.00–0.08)

## 2013-07-05 LAB — BASIC METABOLIC PANEL
BUN: 8 mg/dL (ref 6–23)
Calcium: 9.4 mg/dL (ref 8.4–10.5)
GFR calc Af Amer: >90 mL/min (ref >90)
GFR calc non Af Amer: >90 mL/min (ref >90)
Potassium: 3.4 mEq/L — ABNORMAL LOW (ref 3.5–5.1)
Sodium: 144 mEq/L (ref 135–145)

## 2013-07-05 LAB — CBC
Hemoglobin: 12.9 g/dL (ref 12.0–15.0)
MCHC: 33.6 g/dL (ref 30.0–36.0)
Platelets: 267 10*3/uL (ref 150–400)
RBC: 4.46 MIL/uL (ref 3.87–5.11)

## 2013-07-05 MED ORDER — METHYLPREDNISOLONE SODIUM SUCC 125 MG IJ SOLR
125.0000 mg | Freq: Once | INTRAMUSCULAR | Status: AC
Start: 2013-07-05 — End: 2013-07-05
  Administered 2013-07-05: 125 mg via INTRAVENOUS
  Filled 2013-07-05: qty 2

## 2013-07-05 MED ORDER — ALBUTEROL SULFATE (5 MG/ML) 0.5% IN NEBU
5.0000 mg | INHALATION_SOLUTION | Freq: Once | RESPIRATORY_TRACT | Status: AC
  Administered 2013-07-05: 5 mg via RESPIRATORY_TRACT

## 2013-07-05 MED ORDER — IPRATROPIUM BROMIDE 0.02 % IN SOLN
0.5000 mg | Freq: Once | RESPIRATORY_TRACT | Status: AC
  Administered 2013-07-05: 0.5 mg via RESPIRATORY_TRACT
  Filled 2013-07-05: qty 2.5

## 2013-07-05 MED ORDER — IPRATROPIUM BROMIDE 0.02 % IN SOLN: 0.5000 mg | Freq: Once | RESPIRATORY_TRACT | Status: DC

## 2013-07-05 MED ORDER — DOXYCYCLINE HYCLATE 100 MG PO CAPS: 100.0000 mg | ORAL_CAPSULE | Freq: Two times a day (BID) | ORAL | Status: DC

## 2013-07-05 MED ORDER — ALBUTEROL SULFATE (5 MG/ML) 0.5% IN NEBU
5.0000 mg | INHALATION_SOLUTION | Freq: Once | RESPIRATORY_TRACT | Status: DC
  Filled 2013-07-05: qty 1

## 2013-07-05 MED ORDER — PREDNISONE 20 MG PO TABS: 40.0000 mg | ORAL_TABLET | Freq: Every day | ORAL | Status: DC

## 2013-07-05 NOTE — ED Notes (Signed)
Patient transported to X-ray and returned 

## 2013-07-05 NOTE — ED Provider Notes (Signed)
CSN: [id number]     Arrival date & time 07/05/13  1940 History     First MD Initiated Contact with Patient 07/05/13 1955     Chief Complaint  Patient presents with  . Shortness of Breath   (Consider location/radiation/quality/duration/timing/severity/associated sxs/prior Treatment) The history is provided by the patient and medical records. No language interpreter was used.    Stephanie Schultz is a 57 y.o. female  with a hx of COPD, diabetes, hypertension, anxiety, sleep apnea on CPAP, GERD, anemia, morbid obesity, tobacco abuse presents to the Emergency Department complaining of gradual, persistent, progressively worsening chest pain described as chest tightness with associated increased shortness of breath and productive cough of yellow sputum beginning yesterday.  Associated symptoms include chills without fever.  She also endorses 2 episodes of nausea and vomiting which resolved on their own. Patient reports she is using her home nebulizer with some relief but then the chest pressure and shortness of breath returns. Exertion makes it worse.  Patient is on home oxygen 2 L per minute. Pt denies fever, headache, neck pain, abdominal pain,  diarrhea, weakness, dizziness, syncope.  Review shows the patient has been admitted twice in July for COPD exacerbations. She continues to smoke in spite of her oxygen use and nicotine patch use.     Past Medical History  Diagnosis Date  . Borderline diabetes   . Hypertension   . Anxiety   . Emphysema   . COPD (chronic obstructive pulmonary disease)   . OSA on CPAP     CPAP at night   . Dizziness     pt believes this is motion sickness or vertigo  . GERD (gastroesophageal reflux disease)   . Headache(784.0)   . Cancer 1990    cervical   . Anemia   . Morbid obesity   . Sickle cell trait   . Hx of cardiac catheterization     a. LHC at [facility] in [place], [place] 09/2008:  Normal coronary arteries EF 70%.  . Tobacco abuse     a. up to 3ppd from age 25 to  54, now 1/4 ppd (01/2013)  . Shortness of breath   . Pneumonia    Past Surgical History  Procedure Laterality Date  . Tubal ligation    . Cardiac catheterization    . Colonoscopy  09/05/2012    Procedure: COLONOSCOPY;  Surgeon: Theda Belfast, MD;  Location: [facility];  Service: Endoscopy;  Laterality: N/A;   Family History  Problem Relation Age of Onset  . Lung cancer Paternal Aunt   . Lung cancer Paternal Grandfather   . Other Father     unaware of father's medical history  . Diabetes Mother     alive @ 31  . Other      multiple siblings a&w.   History  Substance Use Topics  . Smoking status: Current Every Day Smoker -- 0.25 packs/day for 36 years    Types: Cigarettes  . Smokeless tobacco: Never Used     Comment: Approx 90 pk-yrs (up to 3ppd until ~ 2009). Smoking 5 cigs per day now.  . Alcohol Use: No   OB History   Grav Para Term Preterm Abortions TAB SAB Ect Mult Living                 Review of Systems  Constitutional: Negative for fever, diaphoresis, appetite change, fatigue and unexpected weight change.  HENT: Negative for mouth sores and neck stiffness.   Eyes: Negative for visual  disturbance.  Respiratory: Positive for cough, chest tightness, shortness of breath and wheezing.   Cardiovascular: Positive for chest pain.  Gastrointestinal: Positive for nausea and vomiting. Negative for abdominal pain, diarrhea and constipation.  Endocrine: Negative for polydipsia, polyphagia and polyuria.  Genitourinary: Negative for dysuria, urgency, frequency and hematuria.  Musculoskeletal: Negative for back pain.  Skin: Negative for rash.  Allergic/Immunologic: Negative for immunocompromised state.  Neurological: Negative for syncope, light-headedness and headaches.  Hematological: Does not bruise/bleed easily.  Psychiatric/Behavioral: Negative for sleep disturbance. The patient is not nervous/anxious.     Allergies  Review of patient's allergies indicates no known  allergies.  Home Medications   Current Outpatient Rx  Name  Route  Sig  Dispense  Refill  . albuterol (PROVENTIL HFA;VENTOLIN HFA) 108 (90 BASE) MCG/ACT inhaler   Inhalation   Inhale 2 puffs into the lungs every 4 (four) hours as needed for wheezing.   1 Inhaler   3   . albuterol (PROVENTIL) (2.5 MG/3ML) 0.083% nebulizer solution   Nebulization   Take 3 mLs (2.5 mg total) by nebulization 4 (four) times daily. DX: 496. For wheezing   75 mL   12   . aspirin EC 81 MG tablet   Oral   Take 81 mg by mouth daily.         . budesonide-formoterol (SYMBICORT) 160-4.5 MCG/ACT inhaler   Inhalation   Inhale 2 puffs into the lungs 2 (two) times daily.         . cyclobenzaprine (FLEXERIL) 10 MG tablet   Oral   Take 10 mg by mouth 2 (two) times daily as needed for muscle spasms.         . diazepam (VALIUM) 10 MG tablet   Oral   Take 10 mg by mouth at bedtime.         Stephanie Kitchen doxycycline (VIBRA-TABS) 100 MG tablet   Oral   Take 100 mg by mouth 2 (two) times daily.         . fluticasone (FLONASE) 50 MCG/ACT nasal spray   Nasal   Place 2 sprays into the nose 2 (two) times daily.   16 g   11   . ipratropium (ATROVENT) 0.02 % nebulizer solution   Nebulization   Take 2.5 mLs (0.5 mg total) by nebulization 4 (four) times daily.   75 mL   12   . loratadine (CLARITIN) 10 MG tablet   Oral   Take 10 mg by mouth daily.         . metFORMIN (GLUCOPHAGE) 500 MG tablet   Oral   Take 1 tablet (500 mg total) by mouth daily with breakfast.   30 tablet   0   . metoprolol tartrate (LOPRESSOR) 12.5 mg TABS   Oral   Take 0.5 tablets (12.5 mg total) by mouth 2 (two) times daily.   60 tablet   0   . omeprazole (PRILOSEC) 20 MG capsule   Oral   Take 20 mg by mouth 2 (two) times daily.         Stephanie Kitchen oxycodone (OXY-IR) 5 MG capsule   Oral   Take 5 mg by mouth every 6 (six) hours as needed for pain.         Stephanie Kitchen tiotropium (SPIRIVA) 18 MCG inhalation capsule   Inhalation   Place 18  mcg into inhaler and inhale daily.         . vitamin B-12 (CYANOCOBALAMIN) 500 MCG tablet   Oral   Take 500  mcg by mouth daily.         Stephanie Kitchen doxycycline (VIBRAMYCIN) 100 MG capsule   Oral   Take 1 capsule (100 mg total) by mouth 2 (two) times daily.   20 capsule   0   . predniSONE (DELTASONE) 20 MG tablet   Oral   Take 2 tablets (40 mg total) by mouth daily.   10 tablet   0    BP 156/89  Pulse 93  Temp(Src) 98.5 F (36.9 C) (Oral)  Resp 22  SpO2 96%  LMP 06/04/2000 Physical Exam  Nursing note and vitals reviewed. Constitutional: She appears well-developed and well-nourished. No distress.  Awake, alert, nontoxic appearance  HENT:  Head: Normocephalic and atraumatic.  Mouth/Throat: Oropharynx is clear and moist. No oropharyngeal exudate.  Eyes: Conjunctivae are normal. Pupils are equal, round, and reactive to light. No scleral icterus.  Neck: Normal range of motion. Neck supple.  Cardiovascular: Normal rate, regular rhythm, normal heart sounds and intact distal pulses.   No murmur heard. Pulses:      Radial pulses are 2+ on the right side, and 2+ on the left side.       Dorsalis pedis pulses are 2+ on the right side, and 2+ on the left side.  Capillary refill less than 3 seconds  Pulmonary/Chest: Accessory muscle usage present. Not tachypneic. She has decreased breath sounds (throughout). She has no wheezes. She has no rhonchi. She has no rales. She exhibits no tenderness and no bony tenderness.  Distant and decreased breath sounds throughout without audible wheezes, rhonchi or rales.  Abdominal: Soft. Bowel sounds are normal. She exhibits no mass. There is no tenderness. There is no rigidity, no rebound, no guarding and no CVA tenderness.  Obese abdomen Nontender to palpation without rigidity or guarding  Musculoskeletal: Normal range of motion. She exhibits no edema.  Lymphadenopathy:    She has no cervical adenopathy.  Neurological: She is alert.  Speech is clear  and goal oriented Moves extremities without ataxia  Skin: Skin is warm and dry. She is not diaphoretic.  Psychiatric: She has a normal mood and affect.    ED Course   Procedures (including critical care time)  Labs Reviewed  BASIC METABOLIC PANEL - Abnormal; Notable for the following:    Potassium 3.4 (*)    CO2 37 (*)    Glucose, Bld 102 (*)    All other components within normal limits  CBC  POCT I-STAT TROPONIN I   Dg Chest 2 View (if Patient Has Fever And/or Copd)  07/05/2013   CLINICAL DATA:  Shortness of breath with wheezing and low oxygen saturation. History of emphysema.  EXAM: CHEST  2 VIEW  COMPARISON:  05/21/2013.  FINDINGS: There is stable cardiomegaly with aortic atherosclerosis and vascular congestion. The basilar aeration has slightly improved. There is no confluent airspace opacity or significant pleural effusion. Degenerative changes throughout the spine are stable.  IMPRESSION: Stable cardiomegaly and vascular congestion. Mildly improved bibasilar atelectasis.   Electronically Signed   By: Roxy Horseman   On: 07/05/2013 20:55   1. COPD exacerbation     MDM  Stephanie Schultz presents with shortness of breath, chest pain, wheezing and significant history of COPD exacerbation. Patient continues to smoke in spite of her frequent exacerbations and home oxygen use.  Will assess for infection vs exacerbation.    11:32 PM Chest x-ray without evidence of pneumonia or pulmonary edema. CBC without leukocytosis, BMP mild hypokalemia 3.4 and negative troponin. ECG nonischemic.  Patient given 2 rounds of albuterol with complete relief of her chest pain, chest tightness and hypoxia.  Patient ambulated in the department with oxygen saturations above 90% (on her home O2 at 2LPM via [place]).  Significant improvement on lung exam with increased tidal volume and clear and equal breath sounds.  She states she feels much better wants to go home. No evidence of infection at this time. We'll discharge  home with doxycycline     (as she lost this prescription did not complete it) and prednisone.  I recommended followup with her primary care physician for further evaluation and treatment.  I have also discussed reasons to return immediately to the ER.  Patient expresses understanding and agrees with plan.     Stephanie Schultz Merinda Victorino, PA-C 07/05/13 [phone]

## 2013-07-05 NOTE — ED Notes (Addendum)
Pt presents with SHOB and cough, recent antibiotic treatment for congestion, pt took 4 days then could not find rest of medicine, noted decreased 02 sat on 2L and labored resp. Poor appetite and vomiting reported

## 2013-07-07 NOTE — ED Provider Notes (Signed)
Medical screening examination/treatment/procedure(s) were performed by non-physician practitioner and as supervising physician I was immediately available for consultation/collaboration.   Lyanne Co, MD 07/07/13 2258

## 2013-07-14 ENCOUNTER — Telehealth: Payer: Self-pay | Admitting: Emergency Medicine

## 2013-07-14 ENCOUNTER — Other Ambulatory Visit: Payer: Self-pay | Admitting: Emergency Medicine

## 2013-07-14 NOTE — Telephone Encounter (Signed)
I spoke with pt. She stated she called [facility] to have them look at her O2 bc she thinks it is leaking and 02 only lasting for about 30-40 minutes. She has not heard from anyone. I have sent to staff message to [place]. Pt aware we will have them give her a call

## 2013-07-16 ENCOUNTER — Emergency Department (HOSPITAL_COMMUNITY)
Admission: EM | Admit: 2013-07-16 | Discharge: 2013-07-16 | Disposition: A | Discharge status: Home / Self Care | Payer: Medicaid Other | Attending: Emergency Medicine | Admitting: Emergency Medicine

## 2013-07-16 ENCOUNTER — Emergency Department (HOSPITAL_COMMUNITY): Payer: Medicaid Other

## 2013-07-16 ENCOUNTER — Encounter (HOSPITAL_COMMUNITY): Payer: Self-pay

## 2013-07-16 DIAGNOSIS — G4733 Obstructive sleep apnea (adult) (pediatric): Secondary | ICD-10-CM | POA: Insufficient documentation

## 2013-07-16 DIAGNOSIS — Z79899 Other long term (current) drug therapy: Secondary | ICD-10-CM | POA: Insufficient documentation

## 2013-07-16 DIAGNOSIS — Z7982 Long term (current) use of aspirin: Secondary | ICD-10-CM | POA: Insufficient documentation

## 2013-07-16 DIAGNOSIS — K219 Gastro-esophageal reflux disease without esophagitis: Secondary | ICD-10-CM | POA: Insufficient documentation

## 2013-07-16 DIAGNOSIS — I10 Essential (primary) hypertension: Secondary | ICD-10-CM | POA: Insufficient documentation

## 2013-07-16 DIAGNOSIS — Z8541 Personal history of malignant neoplasm of cervix uteri: Secondary | ICD-10-CM | POA: Insufficient documentation

## 2013-07-16 DIAGNOSIS — M19079 Primary osteoarthritis, unspecified ankle and foot: Secondary | ICD-10-CM | POA: Insufficient documentation

## 2013-07-16 DIAGNOSIS — F17200 Nicotine dependence, unspecified, uncomplicated: Secondary | ICD-10-CM | POA: Insufficient documentation

## 2013-07-16 DIAGNOSIS — F411 Generalized anxiety disorder: Secondary | ICD-10-CM | POA: Insufficient documentation

## 2013-07-16 DIAGNOSIS — M19071 Primary osteoarthritis, right ankle and foot: Secondary | ICD-10-CM

## 2013-07-16 DIAGNOSIS — Z8701 Personal history of pneumonia (recurrent): Secondary | ICD-10-CM | POA: Insufficient documentation

## 2013-07-16 DIAGNOSIS — IMO0002 Reserved for concepts with insufficient information to code with codable children: Secondary | ICD-10-CM | POA: Insufficient documentation

## 2013-07-16 DIAGNOSIS — D649 Anemia, unspecified: Secondary | ICD-10-CM | POA: Insufficient documentation

## 2013-07-16 DIAGNOSIS — Z9861 Coronary angioplasty status: Secondary | ICD-10-CM | POA: Insufficient documentation

## 2013-07-16 DIAGNOSIS — D573 Sickle-cell trait: Secondary | ICD-10-CM | POA: Insufficient documentation

## 2013-07-16 DIAGNOSIS — Z792 Long term (current) use of antibiotics: Secondary | ICD-10-CM | POA: Insufficient documentation

## 2013-07-16 MED ORDER — OXYCODONE-ACETAMINOPHEN 5-325 MG PO TABS: 1.0000 | ORAL_TABLET | ORAL | Status: DC | PRN

## 2013-07-16 MED ORDER — MORPHINE SULFATE 4 MG/ML IJ SOLN
4.0000 mg | Freq: Once | INTRAMUSCULAR | Status: AC
  Administered 2013-07-16: 4 mg via INTRAVENOUS
  Filled 2013-07-16: qty 1

## 2013-07-16 MED ORDER — ONDANSETRON HCL 4 MG/2ML IJ SOLN
4.0000 mg | Freq: Once | INTRAMUSCULAR | Status: AC
  Administered 2013-07-16: 4 mg via INTRAVENOUS

## 2013-07-16 NOTE — ED Notes (Signed)
Bed: [facility] Expected date: 07/16/13 Expected time: 5:15 AM Means of arrival: Ambulance Comments: Foot pain

## 2013-07-16 NOTE — ED Notes (Signed)
Per EMS, pt from home.  States she woke up with right  foot pain.  Pain hurt all day yesterday but worsened in the night.  No injury to site.  Pt able to ambulate in apartment.  Pt has her home oxygen tank with her per EMS.  Vitals: None taken

## 2013-07-16 NOTE — ED Provider Notes (Signed)
CSN: [id number]     Arrival date & time 07/16/13  6213 History   First MD Initiated Contact with Patient 07/16/13 [phone]     Chief Complaint  Patient presents with  . Foot Pain   (Consider location/radiation/quality/duration/timing/severity/associated sxs/prior Treatment) HPI Comments: 57 year old female with 24 hours of right foot pain. Pain is most localized in the medial great toe. No trauma. Patient denies any erythema or warmth. No fevers, chills or swelling. Denies any prior history of gout. Denies any numbness, weakness, or tingling. Denies any proximal leg swelling.  The history is provided by the patient.    Past Medical History  Diagnosis Date  . Borderline diabetes   . Hypertension   . Anxiety   . Emphysema   . COPD (chronic obstructive pulmonary disease)   . OSA on CPAP     CPAP at night   . Dizziness     pt believes this is motion sickness or vertigo  . GERD (gastroesophageal reflux disease)   . Headache(784.0)   . Cancer 1990    cervical   . Anemia   . Morbid obesity   . Sickle cell trait   . Hx of cardiac catheterization     a. LHC at [facility] in [place], [place] 09/2008:  Normal coronary arteries EF 70%.  . Tobacco abuse     a. up to 3ppd from age 17 to 13, now 1/4 ppd (01/2013)  . Shortness of breath   . Pneumonia    Past Surgical History  Procedure Laterality Date  . Tubal ligation    . Cardiac catheterization    . Colonoscopy  09/05/2012    Procedure: COLONOSCOPY;  Surgeon: Theda Belfast, MD;  Location: [facility];  Service: Endoscopy;  Laterality: N/A;   Family History  Problem Relation Age of Onset  . Lung cancer Paternal Aunt   . Lung cancer Paternal Grandfather   . Other Father     unaware of father's medical history  . Diabetes Mother     alive @ 38  . Other      multiple siblings a&w.   History  Substance Use Topics  . Smoking status: Current Every Day Smoker -- 0.25 packs/day for 36 years    Types: Cigarettes  . Smokeless tobacco: Never  Used     Comment: Approx 90 pk-yrs (up to 3ppd until ~ 2009). Smoking 5 cigs per day now.  . Alcohol Use: No   OB History   Grav Para Term Preterm Abortions TAB SAB Ect Mult Living                 Review of Systems  Constitutional: Negative for fever and chills.  Gastrointestinal: Negative for vomiting.  Musculoskeletal: Negative for joint swelling.  Skin: Negative for color change and wound.  All other systems reviewed and are negative.    Allergies  Review of patient's allergies indicates no known allergies.  Home Medications   Current Outpatient Rx  Name  Route  Sig  Dispense  Refill  . albuterol (PROVENTIL HFA;VENTOLIN HFA) 108 (90 BASE) MCG/ACT inhaler   Inhalation   Inhale 2 puffs into the lungs every 4 (four) hours as needed for wheezing.   1 Inhaler   3   . albuterol (PROVENTIL) (2.5 MG/3ML) 0.083% nebulizer solution   Nebulization   Take 3 mLs (2.5 mg total) by nebulization 4 (four) times daily. DX: 496. For wheezing   75 mL   12   . budesonide-formoterol (SYMBICORT) 160-4.5 MCG/ACT  inhaler   Inhalation   Inhale 2 puffs into the lungs 2 (two) times daily.         . cyclobenzaprine (FLEXERIL) 10 MG tablet   Oral   Take 10 mg by mouth 2 (two) times daily as needed for muscle spasms.         . diazepam (VALIUM) 10 MG tablet   Oral   Take 10 mg by mouth at bedtime.         Marland Kitchen doxycycline (VIBRAMYCIN) 100 MG capsule   Oral   Take 1 capsule (100 mg total) by mouth 2 (two) times daily.   20 capsule   0   . fluticasone (FLONASE) 50 MCG/ACT nasal spray      instill 2 sprays INTO NOSE twice a day   16 g   6   . ipratropium (ATROVENT) 0.02 % nebulizer solution   Nebulization   Take 2.5 mLs (0.5 mg total) by nebulization 4 (four) times daily.   75 mL   12   . loratadine (CLARITIN) 10 MG tablet   Oral   Take 10 mg by mouth daily.         . metFORMIN (GLUCOPHAGE) 500 MG tablet   Oral   Take 1 tablet (500 mg total) by mouth daily with  breakfast.   30 tablet   0   . metoprolol tartrate (LOPRESSOR) 12.5 mg TABS   Oral   Take 0.5 tablets (12.5 mg total) by mouth 2 (two) times daily.   60 tablet   0   . omeprazole (PRILOSEC) 20 MG capsule   Oral   Take 20 mg by mouth 2 (two) times daily.         Marland Kitchen tiotropium (SPIRIVA) 18 MCG inhalation capsule   Inhalation   Place 18 mcg into inhaler and inhale daily.         . vitamin B-12 (CYANOCOBALAMIN) 500 MCG tablet   Oral   Take 500 mcg by mouth daily.         Marland Kitchen aspirin EC 81 MG tablet   Oral   Take 81 mg by mouth daily.         Marland Kitchen oxycodone (OXY-IR) 5 MG capsule   Oral   Take 5 mg by mouth every 6 (six) hours as needed for pain.         Marland Kitchen oxyCODONE-acetaminophen (PERCOCET/ROXICET) 5-325 MG per tablet   Oral   Take 1 tablet by mouth every 4 (four) hours as needed for pain.   20 tablet   0    BP 148/92  Pulse 97  Temp(Src) 98.2 F (36.8 C) (Oral)  Resp 20  Ht 5' (1.524 m)  Wt 216 lb (97.977 kg)  BMI 42.18 kg/m2  SpO2 92%  LMP 06/04/2000 Physical Exam  Vitals reviewed. Constitutional: She is oriented to person, place, and time. She appears well-developed and well-nourished.  HENT:  Head: Normocephalic and atraumatic.  Right Ear: External ear normal.  Left Ear: External ear normal.  Nose: Nose normal.  Eyes: Right eye exhibits no discharge. Left eye exhibits no discharge.  Cardiovascular:  Pulses:      Dorsalis pedis pulses are 2+ on the right side, and 2+ on the left side.  Pulmonary/Chest: Effort normal.  Abdominal: She exhibits no distension.  Musculoskeletal:       Right foot: She exhibits tenderness.       Feet:  NV intact in bilateral lower extremities  Neurological: She is alert and oriented to  person, place, and time. She has normal strength. No sensory deficit.  Skin: Skin is warm and dry. No erythema.    ED Course  Procedures (including critical care time) Labs Review Labs Reviewed - No data to display Imaging Review Dg  Foot Complete Right  07/16/2013   *RADIOLOGY REPORT*  Clinical Data: Foot pain.  No known trauma.  RIGHT FOOT COMPLETE - 3+ VIEW  Comparison: None.  Findings: No acute fracture, subluxation, or suspicious joint narrowing.  There is an 8 mm lucency within the proximal aspect proximal great toe phalanx, subchondral.  This predominately has corticated margins, and is likely a subchondral degenerative cyst secondary to osteoarthritis.  Related joint narrowing and marginal osteophytes are relatively mild.  No aggressive osseous erosion.  No radiodense foreign body.  IMPRESSION:  1.  No acute osseous abnormality. 2.  First MTP osteoarthritis with subchondral cyst/erosion in the proximal phalanx.   Original Report Authenticated By: Tiburcio Pea    MDM   1. Osteoarthritis of toe joint, right    No signs of infection or trauma. Patient's pain controlled with IV narcotics. No fevers or systemic symptoms to suggest infected joint. Has pain with range of motion but not limited range of motion. I feel that this is unlikely to be a septic joint based on presentation. Will treat with oral pain control and orthopedic followup.    Audree Camel, MD 07/16/13 [phone]

## 2013-08-06 ENCOUNTER — Telehealth: Payer: Self-pay | Admitting: Emergency Medicine

## 2013-08-06 NOTE — Telephone Encounter (Signed)
Melissa at [facility] is looking into this; may be a recert for O2. Melissa will call our office back. Hold in Triage until then.

## 2013-08-07 NOTE — Telephone Encounter (Signed)
I called and II llmm

## 2013-08-07 NOTE — Telephone Encounter (Signed)
I spoke with Stephanie Schultz. She stated she checked with many departments and was advised they did not need anything from Stephanie Schultz. I called Stephanie Schultz and made her aware. Nothing further needed

## 2013-09-22 ENCOUNTER — Telehealth: Payer: Self-pay | Admitting: Emergency Medicine

## 2013-09-22 DIAGNOSIS — J449 Chronic obstructive pulmonary disease, unspecified: Secondary | ICD-10-CM

## 2013-09-22 NOTE — Telephone Encounter (Signed)
I spoke with the patient and she states when she lived in [place] she had a PFT once a year. She states she has been seeing Dr. Delton Coombes x 4 years and has never had one in that time. She wants to know why and if one can be ordered. Please advise.  Stephanie Schultz, CMA

## 2013-09-22 NOTE — Telephone Encounter (Signed)
She is correct that [facility] in 2011 at [facility]. I don't think she needs PFTs every year, but it is time to repeat them now (probably overdue). I will order. Please let her know and make sure they get scheduled.

## 2013-09-23 NOTE — Telephone Encounter (Signed)
I spoke with pt. appt has been scheduled. She wanted to wait until Jan to have this done

## 2013-10-02 ENCOUNTER — Emergency Department (HOSPITAL_COMMUNITY)
Admission: EM | Admit: 2013-10-02 | Discharge: 2013-10-02 | Disposition: A | Discharge status: Home / Self Care | Payer: Medicaid Other | Attending: Emergency Medicine | Admitting: Emergency Medicine

## 2013-10-02 ENCOUNTER — Encounter (HOSPITAL_COMMUNITY): Payer: Self-pay | Admitting: Emergency Medicine

## 2013-10-02 ENCOUNTER — Emergency Department (HOSPITAL_COMMUNITY): Payer: Medicaid Other

## 2013-10-02 DIAGNOSIS — K219 Gastro-esophageal reflux disease without esophagitis: Secondary | ICD-10-CM | POA: Insufficient documentation

## 2013-10-02 DIAGNOSIS — Z8541 Personal history of malignant neoplasm of cervix uteri: Secondary | ICD-10-CM | POA: Insufficient documentation

## 2013-10-02 DIAGNOSIS — G4733 Obstructive sleep apnea (adult) (pediatric): Secondary | ICD-10-CM | POA: Insufficient documentation

## 2013-10-02 DIAGNOSIS — Z7982 Long term (current) use of aspirin: Secondary | ICD-10-CM | POA: Insufficient documentation

## 2013-10-02 DIAGNOSIS — Z8701 Personal history of pneumonia (recurrent): Secondary | ICD-10-CM | POA: Insufficient documentation

## 2013-10-02 DIAGNOSIS — Z862 Personal history of diseases of the blood and blood-forming organs and certain disorders involving the immune mechanism: Secondary | ICD-10-CM | POA: Insufficient documentation

## 2013-10-02 DIAGNOSIS — M25579 Pain in unspecified ankle and joints of unspecified foot: Secondary | ICD-10-CM | POA: Insufficient documentation

## 2013-10-02 DIAGNOSIS — M79675 Pain in left toe(s): Secondary | ICD-10-CM

## 2013-10-02 DIAGNOSIS — F17200 Nicotine dependence, unspecified, uncomplicated: Secondary | ICD-10-CM | POA: Insufficient documentation

## 2013-10-02 DIAGNOSIS — Z79899 Other long term (current) drug therapy: Secondary | ICD-10-CM | POA: Insufficient documentation

## 2013-10-02 DIAGNOSIS — F411 Generalized anxiety disorder: Secondary | ICD-10-CM | POA: Insufficient documentation

## 2013-10-02 DIAGNOSIS — I10 Essential (primary) hypertension: Secondary | ICD-10-CM | POA: Insufficient documentation

## 2013-10-02 DIAGNOSIS — J441 Chronic obstructive pulmonary disease with (acute) exacerbation: Secondary | ICD-10-CM | POA: Insufficient documentation

## 2013-10-02 DIAGNOSIS — Z95818 Presence of other cardiac implants and grafts: Secondary | ICD-10-CM | POA: Insufficient documentation

## 2013-10-02 DIAGNOSIS — IMO0002 Reserved for concepts with insufficient information to code with codable children: Secondary | ICD-10-CM | POA: Insufficient documentation

## 2013-10-02 DIAGNOSIS — E119 Type 2 diabetes mellitus without complications: Secondary | ICD-10-CM | POA: Insufficient documentation

## 2013-10-02 HISTORY — DX: Type 2 diabetes mellitus without complications: E11.9

## 2013-10-02 LAB — CBC WITH DIFFERENTIAL/PLATELET
Basophils Absolute: 0 10*3/uL (ref 0.0–0.1)
Basophils Relative: 0 % (ref 0–1)
Eosinophils Relative: 3 % (ref 0–5)
HCT: 38.7 % (ref 36.0–46.0)
Lymphocytes Relative: 31 % (ref 12–46)
Lymphs Abs: 2.4 10*3/uL (ref 0.7–4.0)
MCHC: 34.4 g/dL (ref 30.0–36.0)
MCV: 80 fL (ref 78.0–100.0)
Monocytes Absolute: 0.4 10*3/uL (ref 0.1–1.0)
Neutro Abs: 4.6 10*3/uL (ref 1.7–7.7)
Platelets: 225 10*3/uL (ref 150–400)
RDW: 16 % — ABNORMAL HIGH (ref 11.5–15.5)
WBC: 7.6 10*3/uL (ref 4.0–10.5)

## 2013-10-02 LAB — COMPREHENSIVE METABOLIC PANEL
ALT: 26 U/L (ref 0–35)
AST: 22 U/L (ref 0–37)
Albumin: 4 g/dL (ref 3.5–5.2)
BUN: 11 mg/dL (ref 6–23)
CO2: 26 mEq/L (ref 19–32)
Calcium: 9.8 mg/dL (ref 8.4–10.5)
Creatinine, Ser: 0.86 mg/dL (ref 0.50–1.10)
Sodium: 139 mEq/L (ref 135–145)
Total Protein: 8.1 g/dL (ref 6.0–8.3)

## 2013-10-02 LAB — TROPONIN I: Troponin I: <0.3 ng/mL (ref <0.30)

## 2013-10-02 MED ORDER — METHYLPREDNISOLONE SODIUM SUCC 125 MG IJ SOLR
125.0000 mg | Freq: Once | INTRAMUSCULAR | Status: AC
  Administered 2013-10-02: 125 mg via INTRAVENOUS
  Filled 2013-10-02: qty 2

## 2013-10-02 MED ORDER — HYDROCODONE-ACETAMINOPHEN 5-325 MG PO TABS: 1.0000 | ORAL_TABLET | ORAL | Status: DC | PRN

## 2013-10-02 MED ORDER — MORPHINE SULFATE 4 MG/ML IJ SOLN
4.0000 mg | Freq: Once | INTRAMUSCULAR | Status: AC
  Administered 2013-10-02: 4 mg via INTRAVENOUS
  Filled 2013-10-02: qty 1

## 2013-10-02 MED ORDER — PREDNISONE 50 MG PO TABS: 50.0000 mg | ORAL_TABLET | Freq: Every day | ORAL | Status: DC

## 2013-10-02 MED ORDER — ALBUTEROL SULFATE (5 MG/ML) 0.5% IN NEBU
5.0000 mg | INHALATION_SOLUTION | Freq: Once | RESPIRATORY_TRACT | Status: AC
  Administered 2013-10-02: 5 mg via RESPIRATORY_TRACT
  Filled 2013-10-02: qty 1

## 2013-10-02 NOTE — ED Provider Notes (Signed)
CSN: [id number]     Arrival date & time 10/02/13  0119 History   First MD Initiated Contact with Patient 10/02/13 0227     Chief Complaint  Patient presents with  . Foot Pain   (Consider location/radiation/quality/duration/timing/severity/associated sxs/prior Treatment) HPI Patient presents with left great toe pain x1 week. States the pain is gradually worsening and woke her from sleep. She denies any known trauma. She states she's had gout in the right foot in the past and thinks that is what may be going on in the left.   Patient has history of COPD and wears 2 L of oxygen at home. She states she has mild increased shortness of breath this evening. Denies any chest pain or pedal edema. She has a mild nonproductive cough. Past Medical History  Diagnosis Date  . Hypertension   . Anxiety   . Emphysema   . COPD (chronic obstructive pulmonary disease)   . OSA on CPAP     CPAP at night   . Dizziness     pt believes this is motion sickness or vertigo  . GERD (gastroesophageal reflux disease)   . Headache(784.0)   . Cancer 1990    cervical   . Anemia   . Morbid obesity   . Sickle cell trait   . Hx of cardiac catheterization     a. LHC at [facility] in [place], [place] 09/2008:  Normal coronary arteries EF 70%.  . Tobacco abuse     a. up to 3ppd from age 37 to 14, now 1/4 ppd (01/2013)  . Shortness of breath   . Pneumonia   . Diabetes mellitus without complication    Past Surgical History  Procedure Laterality Date  . Tubal ligation    . Cardiac catheterization    . Colonoscopy  09/05/2012    Procedure: COLONOSCOPY;  Surgeon: Theda Belfast, MD;  Location: [facility];  Service: Endoscopy;  Laterality: N/A;   Family History  Problem Relation Age of Onset  . Lung cancer Paternal Aunt   . Lung cancer Paternal Grandfather   . Other Father     unaware of father's medical history  . Diabetes Mother     alive @ 31  . Other      multiple siblings a&w.   History  Substance Use Topics   . Smoking status: Current Every Day Smoker -- 0.25 packs/day for 36 years    Types: Cigarettes  . Smokeless tobacco: Never Used     Comment: Approx 90 pk-yrs (up to 3ppd until ~ 2009). Smoking 5 cigs per day now.  . Alcohol Use: No   OB History   Grav Para Term Preterm Abortions TAB SAB Ect Mult Living                 Review of Systems  Constitutional: Negative for fever and chills.  Respiratory: Positive for cough, shortness of breath and wheezing.   Cardiovascular: Negative for chest pain, palpitations and leg swelling.  Gastrointestinal: Negative for nausea, vomiting, abdominal pain and diarrhea.  Genitourinary: Negative for dysuria, frequency, hematuria and flank pain.  Musculoskeletal: Positive for arthralgias. Negative for back pain, myalgias, neck pain and neck stiffness.  Skin: Negative for pallor, rash and wound.  Neurological: Negative for dizziness, weakness, light-headedness, numbness and headaches.  All other systems reviewed and are negative.    Allergies  Review of patient's allergies indicates no known allergies.  Home Medications   Current Outpatient Rx  Name  Route  Sig  Dispense  Refill  . albuterol (PROVENTIL HFA;VENTOLIN HFA) 108 (90 BASE) MCG/ACT inhaler   Inhalation   Inhale 2 puffs into the lungs every 4 (four) hours as needed for wheezing.   1 Inhaler   3   . albuterol (PROVENTIL) (2.5 MG/3ML) 0.083% nebulizer solution   Nebulization   Take 3 mLs (2.5 mg total) by nebulization 4 (four) times daily. DX: 496. For wheezing   75 mL   12   . aspirin EC 81 MG tablet   Oral   Take 81 mg by mouth daily.         . budesonide-formoterol (SYMBICORT) 160-4.5 MCG/ACT inhaler   Inhalation   Inhale 2 puffs into the lungs 2 (two) times daily.         . cyclobenzaprine (FLEXERIL) 10 MG tablet   Oral   Take 10 mg by mouth 2 (two) times daily as needed for muscle spasms.         . diazepam (VALIUM) 10 MG tablet   Oral   Take 10 mg by mouth at  bedtime.         . fluticasone (FLONASE) 50 MCG/ACT nasal spray      instill 2 sprays INTO NOSE twice a day   16 g   6   . glipiZIDE (GLUCOTROL) 10 MG tablet   Oral   Take 10 mg by mouth daily before breakfast.         . ipratropium (ATROVENT) 0.02 % nebulizer solution   Nebulization   Take 2.5 mLs (0.5 mg total) by nebulization 4 (four) times daily.   75 mL   12   . loratadine (CLARITIN) 10 MG tablet   Oral   Take 10 mg by mouth daily.         . metoprolol tartrate (LOPRESSOR) 12.5 mg TABS   Oral   Take 0.5 tablets (12.5 mg total) by mouth 2 (two) times daily.   60 tablet   0   . omeprazole (PRILOSEC) 20 MG capsule   Oral   Take 20 mg by mouth 2 (two) times daily.         Marland Kitchen oxycodone (OXY-IR) 5 MG capsule   Oral   Take 5 mg by mouth every 6 (six) hours as needed for pain.         . potassium chloride (K-DUR) 10 MEQ tablet   Oral   Take 10 mEq by mouth daily.         Marland Kitchen tiotropium (SPIRIVA) 18 MCG inhalation capsule   Inhalation   Place 18 mcg into inhaler and inhale daily.         . vitamin B-12 (CYANOCOBALAMIN) 500 MCG tablet   Oral   Take 500 mcg by mouth daily.          BP 122/58  Pulse 92  Temp(Src) 98.5 F (36.9 C) (Oral)  Resp 20  SpO2 96%  LMP 06/04/2000 Physical Exam  Nursing note and vitals reviewed. Constitutional: She is oriented to person, place, and time. She appears well-developed and well-nourished. No distress.  HENT:  Head: Normocephalic and atraumatic.  Mouth/Throat: Oropharynx is clear and moist. No oropharyngeal exudate.  Eyes: EOM are normal. Pupils are equal, round, and reactive to light.  Neck: Normal range of motion. Neck supple.  Cardiovascular: Normal rate and regular rhythm.   Pulmonary/Chest: Effort normal. No respiratory distress. She has no wheezes. She has no rales.  Decreased breath sounds throughout. Question whether due to body habitus.  Abdominal: Soft. Bowel sounds are normal. She exhibits no  distension and no mass. There is no tenderness. There is no rebound and no guarding.  Musculoskeletal: Normal range of motion. She exhibits tenderness (Tenderness to palpation over the MTP joint of the first digit of the left foot. No definite swelling to, warmth or redness.). She exhibits no edema.  No calf swelling or tenderness.  Neurological: She is alert and oriented to person, place, and time.  Moves all extremities without deficit. Sensation grossly intact.  Skin: Skin is warm and dry. No rash noted. No erythema.  Psychiatric:  Anxious appearing    ED Course  Procedures (including critical care time) Labs Review Labs Reviewed  CBC WITH DIFFERENTIAL  COMPREHENSIVE METABOLIC PANEL  PRO B NATRIURETIC PEPTIDE  TROPONIN I   Imaging Review No results found.  EKG Interpretation   None       MDM   No acute findings on workup. Of her left foot pain may be related to gallops I see no evidence for acute flare at this point. X-ray without any bony acute findings. Will treat symptomatically. Patient respiratory status appears to be at her baseline. Will get short course of steroids. Patient been advised to followup with her primary Dr. return reevaluate her foot and pulmonary status. Return precautions have been given.  Loren Racer, MD 10/02/13 [phone]

## 2013-10-02 NOTE — ED Notes (Signed)
Per EMS report: pt from home: Pt c/o left foot pain x 1 week.  EMS noted some swelling, redness, and increased temperature. Pt hx of COPD and c/o of some SOB. Pt on home O2, 2L [place].  Pt denies trauma to the foot.  EMS observed pt ambulate from pt's couch to their stretcher.  Pt a/o x 4.  Skin warm and dry.

## 2013-10-02 NOTE — ED Notes (Signed)
Patient transported to X-ray 

## 2013-10-02 NOTE — ED Notes (Signed)
Bed: WA06 Expected date: 10/02/13 Expected time: 1:06 AM Means of arrival: Ambulance Comments: 57 yo F  Foot pain

## 2013-11-02 ENCOUNTER — Other Ambulatory Visit: Payer: Self-pay | Admitting: Emergency Medicine

## 2013-11-03 ENCOUNTER — Telehealth: Payer: Self-pay | Admitting: Emergency Medicine

## 2013-11-03 NOTE — Telephone Encounter (Signed)
Rx has been sent in already. Pt is aware.

## 2013-11-11 ENCOUNTER — Other Ambulatory Visit: Payer: Self-pay | Admitting: Gastroenterology

## 2013-11-11 DIAGNOSIS — R1033 Periumbilical pain: Secondary | ICD-10-CM

## 2013-11-11 DIAGNOSIS — R1013 Epigastric pain: Secondary | ICD-10-CM

## 2013-11-16 ENCOUNTER — Ambulatory Visit (INDEPENDENT_AMBULATORY_CARE_PROVIDER_SITE_OTHER): Payer: Medicaid Other | Admitting: Emergency Medicine

## 2013-11-16 DIAGNOSIS — J449 Chronic obstructive pulmonary disease, unspecified: Secondary | ICD-10-CM

## 2013-11-16 LAB — PULMONARY FUNCTION TEST
DL/VA % pred: 61 %
DL/VA: 2.6 ml/min/mmHg/L
DLCO unc % pred: 39 %
DLCO unc: 7.51 ml/min/mmHg
FEF 25-75 POST: 0.56 L/s
FEF 25-75 PRE: 0.44 L/s
FEF2575-%Change-Post: 26 %
FEF2575-%PRED-POST: 28 %
FEF2575-%Pred-Pre: 22 %
FEV1-%CHANGE-POST: 7 %
FEV1-%PRED-POST: 53 %
FEV1-%Pred-Pre: 50 %
FEV1-PRE: 0.94 L
FEV1-Post: 1 L
FEV1FVC-%Change-Post: 2 %
FEV1FVC-%Pred-Pre: 73 %
FEV6-%Change-Post: 5 %
FEV6-%Pred-Post: 73 %
FEV6-%Pred-Pre: 69 %
FEV6-Post: 1.66 L
FEV6-Pre: 1.56 L
FEV6FVC-%Change-Post: 1 %
FEV6FVC-%PRED-PRE: 101 %
FEV6FVC-%Pred-Post: 102 %
FVC-%Change-Post: 4 %
FVC-%Pred-Post: 70 %
FVC-%Pred-Pre: 67 %
FVC-Post: 1.66 L
FVC-Pre: 1.58 L
Post FEV1/FVC ratio: 60 %
Post FEV6/FVC ratio: 100 %
Pre FEV1/FVC ratio: 59 %
Pre FEV6/FVC Ratio: 99 %
RV % pred: 109 %
RV: 1.84 L
TLC % pred: 80 %
TLC: 3.6 L

## 2013-11-16 NOTE — Progress Notes (Signed)
PFT done today. 

## 2013-11-20 ENCOUNTER — Ambulatory Visit
Admission: RE | Admit: 2013-11-20 | Discharge: 2013-11-20 | Disposition: A | Discharge status: Home / Self Care | Payer: Medicaid Other | Source: Ambulatory Visit | Attending: Gastroenterology | Admitting: Gastroenterology

## 2013-11-20 DIAGNOSIS — R1033 Periumbilical pain: Secondary | ICD-10-CM

## 2013-11-20 DIAGNOSIS — R1013 Epigastric pain: Secondary | ICD-10-CM

## 2013-11-20 MED ORDER — IOHEXOL 300 MG/ML  SOLN
125.0000 mL | Freq: Once | INTRAMUSCULAR | Status: AC | PRN
  Administered 2013-11-20: 125 mL via INTRAVENOUS

## 2013-11-30 ENCOUNTER — Telehealth: Payer: Self-pay | Admitting: Emergency Medicine

## 2013-11-30 NOTE — Telephone Encounter (Signed)
Pt states is very disappointed that she has not yet received result's of PFT done 11/16/13.  States she should have gotten a phone call with these results. Pt upset and wants call.  Should pt be scheduled for f/u as well?  Pt VERY upset!!! Please advise RB.

## 2013-12-01 NOTE — Telephone Encounter (Signed)
Stephanie Schultz addressed this. Stephanie Schultz has a follow up appt to discuss her PFT

## 2013-12-08 ENCOUNTER — Encounter: Payer: Self-pay | Admitting: Emergency Medicine

## 2013-12-08 ENCOUNTER — Ambulatory Visit (INDEPENDENT_AMBULATORY_CARE_PROVIDER_SITE_OTHER): Payer: Medicaid Other | Admitting: Emergency Medicine

## 2013-12-08 VITALS — BP 134/82 | HR 78 | Temp 98.0°F | Ht 60.0 in | Wt 215.0 lb

## 2013-12-08 DIAGNOSIS — F17200 Nicotine dependence, unspecified, uncomplicated: Secondary | ICD-10-CM

## 2013-12-08 DIAGNOSIS — Z720 Tobacco use: Secondary | ICD-10-CM

## 2013-12-08 DIAGNOSIS — J9620 Acute and chronic respiratory failure, unspecified whether with hypoxia or hypercapnia: Secondary | ICD-10-CM

## 2013-12-08 DIAGNOSIS — G4733 Obstructive sleep apnea (adult) (pediatric): Secondary | ICD-10-CM

## 2013-12-08 DIAGNOSIS — J449 Chronic obstructive pulmonary disease, unspecified: Secondary | ICD-10-CM

## 2013-12-08 NOTE — Patient Instructions (Signed)
Please continue your same medications as you have been taking them Wear your oxygen at 2L/min at all times Wear your CPAP every night Follow with Dr Lamonte Sakai in 4 months or sooner if you have any problems.

## 2013-12-08 NOTE — Assessment & Plan Note (Signed)
-   discussed cessation today

## 2013-12-08 NOTE — Assessment & Plan Note (Signed)
Severe based on sx and PFT. Reviewed these today and gave her a copy. Encouraged her to continue to work on stopping smoking.  - same meds and O2 - rov 4

## 2013-12-08 NOTE — Progress Notes (Signed)
Subjective:    Patient ID: Stephanie Schultz, female    DOB: 1956/01/29, 58 y.o.   MRN: [id number]  HPI 58 yo smoker (67 pk-yrs, has cut down to about 2 cig a day), hx HTN, borderline DM, allergies. Dx with COPD at [facility] in [place]. She was started on O2 in ~2009. She had PFT within the last year. She remains fairly active as long as she wears her O2. She can walk about 30-40 feet, has to stop when shopping to rest. She has daily cough, productive of white phlegm. She has rare exacerbations, last was in Jan 2012.   ROV 07/05/11 -- COPD, OSA. Her data from [place] showed she needs CPAP 10. We ordered O2 thru [facility]. She continues to smoke 3 cigs a day. She has been coughing more, having more sputum - yellow/white sputum.  She is having more dyspnea, more wheezing x 3 weeks. She has gained 30+ lbs since coming to [place]. She has not been wearing her O2 reliably.   ROV 09/20/11 -- COPD, OSA. Returns for f/u. Tells me that she has been rx for COPD exacerbation x 2 since our last visit. She tells me that her CXR showed "a spot on one of her lungs". Was done at [facility] ER on 08/23/11. She was treated with pred and azithro on each occasion. She is on Advair and Spiriva. Continues to have severe cough, costochondritic pain. The pred and azithro helped some but not completely. Ran out loratadine 2 weeks ago. Still on omeprazole bid. She is wearing CPAP 10 reliably.   ROV 10/25/11 -- COPD, OSA on CPAP 10. Last time we started [name] in place of Advair to see if this helped w UA sx. Still on Spiriva. Restarted loratadine and continued omeprazole bid. She feels that her breathing is the same, cough is a bit better on the [facility]. No flares, but she is still having nasal gtt despite the loratadine.   12/17/2011 Acute OV  Pt presents for an acute office visit, complains of increased SOB, wheezing, prod cough with yellow-to-brown mucus  2 weeks . Went to ED on 1.23.13 and was given zpak and prednisone 50mg  x 4 days. CXR with  chronic changing. Has finished abx and steroids. Got some better but cough never went away. Still smoking 1/2 PPD (takes O2 off -goes outside to smoke) .  Remains on Dulera and Spiriva  With no missed doses.  OTC not helping.  Wants referral for home health.  Encouraged to quit smoking.   ROV 02/18/12 -- COPD, OSA on CPAP, chronic cough. Had an AE with residual cough as described above 2/13. Remains on Spiriva + Dulera. Remains on omeprazole bid, flonase, not using loratadine right now. Still smoking about 3 cig a day. She is interested in trying the nicotine patches  ROV 07/08/12 -- COPD, OSA on CPAP, chronic cough. She reports that she quit smoking July 3! She has gained 60 lbs over the last year. Her breathing is stable, still limited. She is wearing her O2 and her CPAP. Continues to take Spiriva + Dulera. She reports that she is still having GERD sx even on omeprazole bid. She ran out of flonase 1 month ago. She has had some epistaxis on R.   ROV 11/25/12 -- COPD, OSA on CPAP, chronic cough. Since last time she restarted smoking - currently at 5 cig a day. She ran out of her [name] a week ago. Still taking spiriva. She has been having more cough over about  2 weeks. She has been using albuterol nebs bid - about to run out of it, need refill. She remains on loratadine, fluticasone, omeprazole. She hasn't been using CPAP regularly - needs new mask, this is being arranged. She is to undergo breast bx for a nodule this week.    [facility] follow up 12/19/12 Patient returns for post hospital followup. Patient was admitted January 24-27th 2014 for COPD, exacerbation. She was treated with IV antibiotics, nebulized bronchodilators, and IV steroids.  She was discharged on a steroid taper, along with Levaquin.  Since discharge. She has had  some improvement however, continues to have productive cough, with some thick, yellow. Mucus. Unfortunately patient has restarted smoking. Smoking cessation education was  given. Chest x-ray today shows no acute process Patient denies any hemoptysis, orthopnea, PND, or leg swelling.   03/11/13 -- COPD, OSA on CPAP, chronic cough, tobacco use. Admitted 3/6 - 3/11 for AE-COPD. She feels better. She is having some nasal congestion, on loratadine + nasal steroid. ENT saw her and increased her PPI. She is wearing her CPAP.   ER Follow up 06/12/13 --  pt reports breathing is doing much better since last ED visit 7/10 w SOB given pred and abx but did not take abx-- wearing CPAP 1-2 nights per week-- states it makes her feel claustrophobic-- denies any other concerns at this time Cough and congestion have improved. Shortness, of breath is also decreased. Patient denies any hemoptysis, orthopnea, PND, or increased leg swelling  ROV 12/08/13 -- COPD, OSA on CPAP, chronic cough, tobacco use.  She underwent PFT on 11/16/13 > severe AFL, no BD response, superimposed restriction, decreased DLCO.  Her FEV1 is stable from '09, FVC is decrease slightly. She is upset today because she was unable to be seen on 1/5 when she was here for PFT and was sick. She is also upset that I didn't come to see her when she was admitted to hospital since last Summer. She is still smoking a few cigarettes a day. Remains on Spiriva, symbicort, duonebs prn. Her O2 is set at  2L/min. She is wearing CPAP reliably    ROS  See history of present illness   Objective:   Physical Exam Filed Vitals:   12/08/13 1054 12/08/13 1059  BP: 134/82   Pulse: 78   Temp: 98 F (36.7 C)   TempSrc: Oral   Height: 5' (1.524 m)   Weight: 215 lb (97.523 kg)   SpO2: 80% 95%    Gen: Angry today, obese, in no distress,  normal affect  ENT: No lesions,  mouth clear,  oropharynx clear, no postnasal drip, hoarse voice  Neck: No JVD, no TMG, no carotid bruits  Lungs: No use of accessory muscles, distant, no wheezes   Cardiovascular: RRR, heart sounds normal, no murmur or gallops, no peripheral edema  Musculoskeletal:  No deformities, no cyanosis or clubbing  Neuro: alert, non focal  Skin: Warm, no lesions or rashes, old scars on B UE's   Assessment & Plan:   COPD (chronic obstructive pulmonary disease) Severe based on sx and PFT. Reviewed these today and gave her a copy. Encouraged her to continue to work on stopping smoking.  - same meds and O2 - rov 4  Acute and chronic respiratory failure Multifactorial - COPD, OSA/OHS, UA irritation   Tobacco abuse - discussed cessation today  Obstructive sleep apnea - continue CPAP qhs - distilled water only in humidifier

## 2013-12-08 NOTE — Assessment & Plan Note (Signed)
-   continue CPAP qhs - distilled water only in humidifier

## 2013-12-08 NOTE — Assessment & Plan Note (Signed)
Multifactorial - COPD, OSA/OHS, UA irritation

## 2013-12-10 ENCOUNTER — Other Ambulatory Visit: Payer: Self-pay | Admitting: Emergency Medicine

## 2013-12-14 ENCOUNTER — Other Ambulatory Visit: Payer: Self-pay | Admitting: Internal Medicine

## 2013-12-14 DIAGNOSIS — N63 Unspecified lump in unspecified breast: Secondary | ICD-10-CM

## 2013-12-22 ENCOUNTER — Ambulatory Visit
Admission: RE | Admit: 2013-12-22 | Discharge: 2013-12-22 | Disposition: A | Discharge status: Home / Self Care | Payer: Medicaid Other | Source: Ambulatory Visit | Attending: Internal Medicine | Admitting: Internal Medicine

## 2013-12-22 DIAGNOSIS — N63 Unspecified lump in unspecified breast: Secondary | ICD-10-CM

## 2013-12-31 ENCOUNTER — Other Ambulatory Visit: Payer: Self-pay | Admitting: Emergency Medicine

## 2014-01-01 ENCOUNTER — Telehealth: Payer: Self-pay | Admitting: Emergency Medicine

## 2014-01-01 NOTE — Telephone Encounter (Signed)
Pt will come by on Wednesday to be requalified for oxygen.

## 2014-01-01 NOTE — Telephone Encounter (Signed)
Returning call can be reached at [phone].Elnita Maxwell

## 2014-01-01 NOTE — Telephone Encounter (Signed)
When pt was in 12/08/13 she was not re tested.  Pt will need to be brought back in to do so.    LMTCB x1

## 2014-01-06 ENCOUNTER — Encounter (INDEPENDENT_AMBULATORY_CARE_PROVIDER_SITE_OTHER): Payer: Medicaid Other

## 2014-01-06 DIAGNOSIS — J449 Chronic obstructive pulmonary disease, unspecified: Secondary | ICD-10-CM

## 2014-01-09 ENCOUNTER — Other Ambulatory Visit: Payer: Self-pay | Admitting: Emergency Medicine

## 2014-01-18 NOTE — Progress Notes (Signed)
This encounter was created in error - please disregard.

## 2014-02-02 ENCOUNTER — Telehealth: Payer: Self-pay | Admitting: Emergency Medicine

## 2014-02-02 MED ORDER — IPRATROPIUM BROMIDE 0.02 % IN SOLN: 0.5000 mg | Freq: Four times a day (QID) | RESPIRATORY_TRACT | Status: DC

## 2014-02-02 NOTE — Telephone Encounter (Signed)
Pt aware RX has been sent to the pharm. Nothing further needed 

## 2014-02-08 ENCOUNTER — Telehealth: Payer: Self-pay | Admitting: Emergency Medicine

## 2014-02-08 DIAGNOSIS — J449 Chronic obstructive pulmonary disease, unspecified: Secondary | ICD-10-CM

## 2014-02-08 NOTE — Telephone Encounter (Signed)
Spoke with the pt and notified of recs per RB  She verbalized understanding and states nothing further needed Order was sent to [facility] for POC

## 2014-02-08 NOTE — Telephone Encounter (Signed)
Yes this is Ok 

## 2014-02-08 NOTE — Telephone Encounter (Signed)
Spoke with pt. She is requesting POC order to be faxed to [facility] for simply go. Per pt she uses 2 liters O2. Please advise RB if okay? thanks

## 2014-03-03 ENCOUNTER — Encounter: Payer: Self-pay | Admitting: Diagnostic Neuroimaging

## 2014-03-03 ENCOUNTER — Ambulatory Visit (INDEPENDENT_AMBULATORY_CARE_PROVIDER_SITE_OTHER): Payer: Medicaid Other | Admitting: Diagnostic Neuroimaging

## 2014-03-03 ENCOUNTER — Encounter (INDEPENDENT_AMBULATORY_CARE_PROVIDER_SITE_OTHER): Payer: Self-pay

## 2014-03-03 VITALS — BP 120/76 | HR 75 | Temp 98.0°F | Ht 60.0 in | Wt 206.5 lb

## 2014-03-03 DIAGNOSIS — E1142 Type 2 diabetes mellitus with diabetic polyneuropathy: Secondary | ICD-10-CM

## 2014-03-03 DIAGNOSIS — E1140 Type 2 diabetes mellitus with diabetic neuropathy, unspecified: Secondary | ICD-10-CM

## 2014-03-03 DIAGNOSIS — E1149 Type 2 diabetes mellitus with other diabetic neurological complication: Secondary | ICD-10-CM

## 2014-03-03 DIAGNOSIS — G5600 Carpal tunnel syndrome, unspecified upper limb: Secondary | ICD-10-CM

## 2014-03-03 DIAGNOSIS — M109 Gout, unspecified: Secondary | ICD-10-CM | POA: Insufficient documentation

## 2014-03-03 MED ORDER — GABAPENTIN 300 MG PO CAPS: 300.0000 mg | ORAL_CAPSULE | Freq: Two times a day (BID) | ORAL | Status: DC

## 2014-03-03 NOTE — Patient Instructions (Signed)
Start gabapentin 300 mg at bedtime. After one to 2 week, increase to twice a day. Stay on gabapentin for at least one to 2 months.

## 2014-03-03 NOTE — Progress Notes (Signed)
[facility]  PATIENT: Stephanie Schultz DOB: September 18, 1956  REFERRING CLINICIAN: Garba HISTORY FROM: patient  REASON FOR VISIT: new consult    HISTORICAL  CHIEF COMPLAINT:  Chief Complaint  Patient presents with  . Carpal Tunnel  . Numbness    fingers, both hands  . Pain    R hand    HISTORY OF PRESENT ILLNESS:   58 year old right-handed female here for evaluation of bilateral hand and foot pain and numbness and swelling.  5 years ago patient developed pain and numbness in her hands, was evaluated with EMG nerve conduction in [place], diagnosed carpal tunnel syndrome. She was treated with cortisone injections and splints without relief.  Now patient continues to have symptoms, getting worse. She also has swelling in her hand joints and toes. She was using diagnosed with gout and started on Indocin. Patient also has numbness and tingling in her toes.  Patient has diabetes with morning sugars running in the 80-100 range.  REVIEW OF SYSTEMS: Full 14 system review of systems performed and notable only for headache numbness anxiety change in appetite cramps I pain blurred vision weight loss itching.  ALLERGIES: No Known Allergies  HOME MEDICATIONS: Outpatient Prescriptions Prior to Visit  Medication Sig Dispense Refill  . albuterol (PROVENTIL HFA;VENTOLIN HFA) 108 (90 BASE) MCG/ACT inhaler Inhale 2 puffs into the lungs every 4 (four) hours as needed for wheezing.  1 Inhaler  3  . albuterol (PROVENTIL) (2.5 MG/3ML) 0.083% nebulizer solution INHALE CONTENTS OF 1 VIAL IN NEBULIZER 4 TIMES A DAY  300 mL  1  . aspirin EC 81 MG tablet Take 81 mg by mouth daily.      . budesonide-formoterol (SYMBICORT) 160-4.5 MCG/ACT inhaler Inhale 2 puffs into the lungs 2 (two) times daily.      Marland Kitchen esomeprazole (NEXIUM) 20 MG capsule Take 20 mg by mouth daily at 12 noon.      Marland Kitchen glipiZIDE (GLUCOTROL) 10 MG tablet Take 10 mg by mouth daily before breakfast.      .  HYDROcodone-acetaminophen (NORCO) 5-325 MG per tablet Take 1 tablet by mouth every 4 (four) hours as needed for severe pain.  10 tablet  0  . ipratropium (ATROVENT) 0.02 % nebulizer solution Take 2.5 mLs (0.5 mg total) by nebulization 4 (four) times daily.  360 mL  6  . loratadine (CLARITIN) 10 MG tablet Take 10 mg by mouth daily.      Marland Kitchen oxycodone (OXY-IR) 5 MG capsule Take 5 mg by mouth every 6 (six) hours as needed for pain.      Marland Kitchen SPIRIVA HANDIHALER 18 MCG inhalation capsule inhale the contents of one capsule in the handihaler once daily  30 capsule  5  . SYMBICORT 160-4.5 MCG/ACT inhaler inhale 2 puffs by mouth twice a day  10.2 g  6  . tiotropium (SPIRIVA) 18 MCG inhalation capsule Place 18 mcg into inhaler and inhale daily.      . potassium chloride (K-DUR) 10 MEQ tablet Take 10 mEq by mouth as needed.       . vitamin B-12 (CYANOCOBALAMIN) 500 MCG tablet Take 500 mcg by mouth daily.       No facility-administered medications prior to visit.    PAST MEDICAL HISTORY: Past Medical History  Diagnosis Date  . Hypertension   . Anxiety   . Emphysema   . COPD (chronic obstructive pulmonary disease)   . OSA on CPAP     CPAP at night   . Dizziness  pt believes this is motion sickness or vertigo  . GERD (gastroesophageal reflux disease)   . Headache(784.0)   . Cancer 1990    cervical   . Anemia   . Morbid obesity   . Sickle cell trait   . Hx of cardiac catheterization     a. LHC at [facility] in [place] 09/2008:  Normal coronary arteries EF 70%.  . Tobacco abuse     a. up to 3ppd from age 79 to 14, now 1/4 ppd (01/2013)  . Shortness of breath   . Pneumonia   . Diabetes mellitus without complication     PAST SURGICAL HISTORY: Past Surgical History  Procedure Laterality Date  . Tubal ligation    . Cardiac catheterization    . Colonoscopy  09/05/2012    Procedure: COLONOSCOPY;  Surgeon: Beryle Beams, MD;  Location: [facility];  Service: Endoscopy;  Laterality: N/A;     FAMILY HISTORY: Family History  Problem Relation Age of Onset  . Lung cancer Paternal Aunt   . Lung cancer Paternal Grandfather   . Other Father     unaware of father's medical history  . Diabetes Mother     alive @ 36  . Myasthenia gravis Mother   . Other      multiple siblings a&w.    SOCIAL HISTORY:  History   Social History  . Marital Status: Single    Spouse Name: N/A    Number of Children: 3  . Years of Education: 11th   Occupational History  . Disabled     Emphysema   Social History Main Topics  . Smoking status: Current Every Day Smoker -- 0.25 packs/day for 36 years    Types: Cigarettes  . Smokeless tobacco: Never Used     Comment: Approx 90 pk-yrs (up to 3ppd until ~ 2009). Smoking 3 cigs per day now.  . Alcohol Use: No  . Drug Use: No  . Sexual Activity: Yes   Other Topics Concern  . Not on file   Social History Narrative   From [place], [place].  Moved to [place] about 8 years ago to be closer to her daughter. She moved back to [place] about a year and a half ago. She lives by herself. She does not routinely exercise or adhere to any particular diet.   Caffeine Use: 1-2 cups daily     PHYSICAL EXAM  Filed Vitals:   03/03/14 0856  BP: 120/76  Pulse: 75  Temp: 98 F (36.7 C)  TempSrc: Oral  Height: 5' (1.524 m)  Weight: 206 lb 8 oz (93.668 kg)    Not recorded    Body mass index is 40.33 kg/(m^2).  GENERAL EXAM: Patient is in no distress; well developed, nourished and groomed; neck is supple  CARDIOVASCULAR: Regular rate and rhythm, no murmurs, no carotid bruits  NEUROLOGIC: MENTAL STATUS: awake, alert, oriented to person, place and time, recent and remote memory intact, normal attention and concentration, language fluent, comprehension intact, naming intact, fund of knowledge appropriate; PRESSURED SPEECH; PLEASANT.  CRANIAL NERVE: no papilledema on fundoscopic exam, pupils equal and reactive to light, visual fields full to  confrontation, extraocular muscles intact, no nystagmus, facial sensation and strength symmetric, hearing intact, palate elevates symmetrically, uvula midline, shoulder shrug symmetric, tongue midline. MOTOR: normal bulk and tone, full strength in the BUE, BLE; EXCEPT LIMITED IN GRIP (4) DUE TO PAIN SENSORY: VIB 5-6 SEC AT TOES; DECR PP IN FEET. POSITIVE PHALENS. NEG TINELS. COORDINATION: finger-nose-finger,  fine finger movements  normal REFLEXES: BUE TRACE, KNEES TRACE, ANKLES 0 GAIT/STATION: narrow based gait; SLOW AND CAUTIOUS.    DIAGNOSTIC DATA (LABS, IMAGING, TESTING) - I reviewed patient records, labs, notes, testing and imaging myself where available.  Lab Results  Component Value Date   WBC 7.6 10/02/2013   HGB 13.3 10/02/2013   HCT 38.7 10/02/2013   MCV 80.0 10/02/2013   PLT 225 10/02/2013      Component Value Date/Time   NA 139 10/02/2013 0333   K 3.6 10/02/2013 0333   CL 101 10/02/2013 0333   CO2 26 10/02/2013 0333   GLUCOSE 102* 10/02/2013 0333   BUN 11 10/02/2013 0333   CREATININE 0.86 10/02/2013 0333   CALCIUM 9.8 10/02/2013 0333   PROT 8.1 10/02/2013 0333   ALBUMIN 4.0 10/02/2013 0333   AST 22 10/02/2013 0333   ALT 26 10/02/2013 0333   ALKPHOS 40 10/02/2013 0333   BILITOT 0.3 10/02/2013 0333   GFRNONAA 75* 10/02/2013 0333   GFRAA 87* 10/02/2013 0333   Lab Results  Component Value Date   CHOL 201* 04/26/2013   HDL 81 04/26/2013   LDLCALC 88 04/26/2013   TRIG 160* 04/26/2013   CHOLHDL 2.5 04/26/2013   Lab Results  Component Value Date   HGBA1C 6.9* 01/20/2013   No results found for this basename: [id number]   Lab Results  Component Value Date   TSH 0.516 04/26/2013    09/25/13 URIC ACID - 7.5 (H)   ASSESSMENT AND PLAN  58 y.o. year old female here with diabetes, COPD, gout, here for evaluation of bilateral hand numbness and tingling, bilateral foot numbness, bilateral hand and foot swelling.   DIAGNOSIS: - Her symptoms are likely due to  combination of diabetic neuropathy, carpal tunnel syndrome and gout.   PLAN: - Patient is reluctant to undergo additional EMG nerve conduction testing, cortisone injections, surgery. We will treat conservatively with gabapentin.  Meds ordered this encounter  Medications  . gabapentin (NEURONTIN) 300 MG capsule    Sig: Take 1 capsule (300 mg total) by mouth 2 (two) times daily.    Dispense:  60 capsule    Refill:  6   Return in about 3 months (around 06/02/2014) for with Charlott Holler or Penumalli.   Penni Bombard, MD 05/18/8674, 4:49 AM Certified in Neurology, Neurophysiology and Neuroimaging  [facility] [address] [place], [place] [address] [phone]

## 2014-04-12 ENCOUNTER — Other Ambulatory Visit: Payer: Self-pay | Admitting: Gastroenterology

## 2014-04-12 DIAGNOSIS — R1013 Epigastric pain: Secondary | ICD-10-CM

## 2014-04-19 ENCOUNTER — Other Ambulatory Visit: Payer: Medicaid Other

## 2014-04-27 ENCOUNTER — Ambulatory Visit
Admission: RE | Admit: 2014-04-27 | Discharge: 2014-04-27 | Disposition: A | Discharge status: Home / Self Care | Payer: Medicaid Other | Source: Ambulatory Visit | Attending: Gastroenterology | Admitting: Gastroenterology

## 2014-04-27 ENCOUNTER — Telehealth: Payer: Self-pay | Admitting: Emergency Medicine

## 2014-04-27 DIAGNOSIS — R1013 Epigastric pain: Secondary | ICD-10-CM

## 2014-04-27 NOTE — Telephone Encounter (Signed)
Called spoke with pt. Aware she needs OV for an eval. She did not want to come in until Thursday. appt scheduled. Nothing further needed

## 2014-04-29 ENCOUNTER — Encounter: Payer: Self-pay | Admitting: Internal Medicine

## 2014-04-29 ENCOUNTER — Encounter (INDEPENDENT_AMBULATORY_CARE_PROVIDER_SITE_OTHER): Payer: Self-pay

## 2014-04-29 ENCOUNTER — Ambulatory Visit (INDEPENDENT_AMBULATORY_CARE_PROVIDER_SITE_OTHER): Payer: Medicaid Other | Admitting: Internal Medicine

## 2014-04-29 VITALS — BP 122/70 | HR 83 | Ht 60.0 in | Wt 211.0 lb

## 2014-04-29 DIAGNOSIS — G4733 Obstructive sleep apnea (adult) (pediatric): Secondary | ICD-10-CM

## 2014-04-29 DIAGNOSIS — J449 Chronic obstructive pulmonary disease, unspecified: Secondary | ICD-10-CM

## 2014-04-29 DIAGNOSIS — F17200 Nicotine dependence, unspecified, uncomplicated: Secondary | ICD-10-CM

## 2014-04-29 DIAGNOSIS — Z720 Tobacco use: Secondary | ICD-10-CM

## 2014-04-29 DIAGNOSIS — J441 Chronic obstructive pulmonary disease with (acute) exacerbation: Secondary | ICD-10-CM

## 2014-04-29 MED ORDER — METHYLPREDNISOLONE ACETATE 80 MG/ML IJ SUSP
80.0000 mg | Freq: Once | INTRAMUSCULAR | Status: AC
  Administered 2014-04-29: 80 mg via INTRAMUSCULAR

## 2014-04-29 MED ORDER — DOXYCYCLINE HYCLATE 100 MG PO TABS: ORAL_TABLET | ORAL | Status: DC

## 2014-04-29 NOTE — Patient Instructions (Signed)
Depo 80  Script sent for doxycycline antibiotic

## 2014-04-29 NOTE — Progress Notes (Signed)
Subjective:    Patient ID: Stephanie Schultz, female    DOB: 04/26/1956, 58 y.o.   MRN: [id number]  HPI 58 yo smoker (36 pk-yrs, has cut down to about 2 cig a day), hx HTN, borderline DM, allergies. Dx with COPD at [facility] in [place]. She was started on O2 in ~2009. She had PFT within the last year. She remains fairly active as long as she wears her O2. She can walk about 30-40 feet, has to stop when shopping to rest. She has daily cough, productive of white phlegm. She has rare exacerbations, last was in Jan 2012.   ROV 07/05/11 -- COPD, OSA. Her data from [place] showed she needs CPAP 10. We ordered O2 thru [facility]. She continues to smoke 3 cigs a day. She has been coughing more, having more sputum - yellow/white sputum.  She is having more dyspnea, more wheezing x 3 weeks. She has gained 30+ lbs since coming to [place]. She has not been wearing her O2 reliably.   ROV 09/20/11 -- COPD, OSA. Returns for f/u. Tells me that she has been rx for COPD exacerbation x 2 since our last visit. She tells me that her CXR showed "a spot on one of her lungs". Was done at [facility] ER on 08/23/11. She was treated with pred and azithro on each occasion. She is on Advair and Spiriva. Continues to have severe cough, costochondritic pain. The pred and azithro helped some but not completely. Ran out loratadine 2 weeks ago. Still on omeprazole bid. She is wearing CPAP 10 reliably.   ROV 10/25/11 -- COPD, OSA on CPAP 10. Last time we started [facility] in place of Advair to see if this helped w UA sx. Still on Spiriva. Restarted loratadine and continued omeprazole bid. She feels that her breathing is the same, cough is a bit better on the [facility]. No flares, but she is still having nasal gtt despite the loratadine.   12/17/2011 Acute OV  Pt presents for an acute office visit, complains of increased SOB, wheezing, prod cough with yellow-to-brown mucus  2 weeks . Went to ED on 1.23.13 and was given zpak and prednisone 50mg  x 4 days. CXR with  chronic changing. Has finished abx and steroids. Got some better but cough never went away. Still smoking 1/2 PPD (takes O2 off -goes outside to smoke) .  Remains on Dulera and Spiriva  With no missed doses.  OTC not helping.  Wants referral for home health.  Encouraged to quit smoking.   ROV 02/18/12 -- COPD, OSA on CPAP, chronic cough. Had an AE with residual cough as described above 2/13. Remains on Spiriva + Dulera. Remains on omeprazole bid, flonase, not using loratadine right now. Still smoking about 3 cig a day. She is interested in trying the nicotine patches  ROV 07/08/12 -- COPD, OSA on CPAP, chronic cough. She reports that she quit smoking July 3! She has gained 60 lbs over the last year. Her breathing is stable, still limited. She is wearing her O2 and her CPAP. Continues to take Spiriva + Dulera. She reports that she is still having GERD sx even on omeprazole bid. She ran out of flonase 1 month ago. She has had some epistaxis on R.   ROV 11/25/12 -- COPD, OSA on CPAP, chronic cough. Since last time she restarted smoking - currently at 5 cig a day. She ran out of her [name] a week ago. Still taking spiriva. She has been having more cough over about  2 weeks. She has been using albuterol nebs bid - about to run out of it, need refill. She remains on loratadine, fluticasone, omeprazole. She hasn't been using CPAP regularly - needs new mask, this is being arranged. She is to undergo breast bx for a nodule this week.    [facility] follow up 12/19/12 Patient returns for post hospital followup. Patient was admitted January 24-27th 2014 for COPD, exacerbation. She was treated with IV antibiotics, nebulized bronchodilators, and IV steroids.  She was discharged on a steroid taper, along with Levaquin.  Since discharge. She has had  some improvement however, continues to have productive cough, with some thick, yellow. Mucus. Unfortunately patient has restarted smoking. Smoking cessation education was  given. Chest x-ray today shows no acute process Patient denies any hemoptysis, orthopnea, PND, or leg swelling.   03/11/13 -- COPD, OSA on CPAP, chronic cough, tobacco use. Admitted 3/6 - 3/11 for AE-COPD. She feels better. She is having some nasal congestion, on loratadine + nasal steroid. ENT saw her and increased her PPI. She is wearing her CPAP.   ER Follow up 06/12/13 --  pt reports breathing is doing much better since last ED visit 7/10 w SOB given pred and abx but did not take abx-- wearing CPAP 1-2 nights per week-- states it makes her feel claustrophobic-- denies any other concerns at this time Cough and congestion have improved. Shortness, of breath is also decreased. Patient denies any hemoptysis, orthopnea, PND, or increased leg swelling  ROV 12/08/13 -- COPD, OSA on CPAP, chronic cough, tobacco use.  She underwent PFT on 11/16/13 > severe AFL, no BD response, superimposed restriction, decreased DLCO.  Her FEV1 is stable from '09, FVC is decrease slightly. She is upset today because she was unable to be seen on 1/5 when she was here for PFT and was sick. She is also upset that I didn't come to see her when she was admitted to hospital since last Summer. She is still smoking a few cigarettes a day. Remains on Spiriva, symbicort, duonebs prn. Her O2 is set at  2L/min. She is wearing CPAP reliably   COPD (chronic obstructive pulmonary disease) Severe based on sx and PFT. Reviewed these today and gave her a copy. Encouraged her to continue to work on stopping smoking.  - same meds and O2 - rov 4 Acute and chronic respiratory failure Multifactorial - COPD, OSA/OHS, UA irritation  Tobacco abuse - discussed cessation today Obstructive sleep apnea - continue CPAP qhs - distilled water only in humidifier  04/29/14- 19 yoF smoker followed by Dr Lamonte Sakai for COPD, OSA on CPAP, chronic cough, tobacco use ACUTE VISIT:  c/o: COPD flare.  Having increased sob, wheezing, tightness in chest and cough  non-productive.  Also, having in back lung area and front.  Symptoms x1 week.  Wearing CPAP 6-8 hours per night Admits still smoking "some". 2 episodes in the past week of increased cough, dry or scant brown/green. Ache across sternum with hard cough. Increased shortness of breath. Suspects she is catching a cold. No fever. Hopes to avoid prednisone. Using nebulizer 4 times daily, CPAP every night, O2 2 L/[facility].  ROS-see HPI Constitutional:   No-   weight loss, night sweats, fevers, chills, fatigue, lassitude. HEENT:   No-  headaches, difficulty swallowing, tooth/dental problems, sore throat,       No-  sneezing, itching, ear ache, nasal congestion, post nasal drip,  CV:  +atypicalchest pain, no-orthopnea, PND, swelling in lower extremities, anasarca,  dizziness, palpitations Resp: + shortness of breath with exertion or at rest.              + productive cough,  + non-productive cough,  No- coughing up of blood.              + change in color of mucus.  No- wheezing.   Skin: No-   rash or lesions. GI:  No-   heartburn, indigestion, abdominal pain, nausea, vomiting, GU:  MS:  No-   joint pain or swelling.   Neuro-     nothing unusual Psych:  No- change in mood or affect. No depression or anxiety.  No memory loss.   OBJ- Physical Exam O2 2L sat 94% General- Alert, Oriented, Affect-appropriate, Distress- none acute. +obese Skin- rash-none, lesions- none, excoriation- none Lymphadenopathy- none Head- atraumatic            Eyes- Gross vision intact, PERRLA, conjunctivae and secretions clear            Ears- Hearing, canals-normal            Nose- Clear, no-Septal dev, mucus, polyps, erosion, perforation             Throat- Mallampati II , mucosa clear , drainage- none, tonsils- atrophic Neck- flexible , trachea midline, no stridor , thyroid nl, carotid no bruit Chest - symmetrical excursion , unlabored           Heart/CV- RRR , no murmur , no gallop  , no  rub, nl s1 s2                           - JVD- none , edema- none, stasis changes- none, varices- none           Lung- +few distant rhonchi, unlabored, wheeze- none, cough+light , dullness-none,                       Rub-none           Chest wall-  Abd- Br/ Gen/ Rectal- Not done, not indicated Extrem- cyanosis- none, clubbing, none, atrophy- none, strength- nl Neuro- grossly intact to observation    Objective:

## 2014-05-02 ENCOUNTER — Encounter: Payer: Self-pay | Admitting: Internal Medicine

## 2014-05-02 NOTE — Assessment & Plan Note (Signed)
Compliant with CPAP 

## 2014-05-02 NOTE — Assessment & Plan Note (Signed)
Acute bronchitic exacerbation of COPD Plan-smoking cessation, Depo-Medrol, doxycycline. Follow up with [name]

## 2014-05-02 NOTE — Assessment & Plan Note (Signed)
I  told her diagnosis of severe COPD based on PFTs and counseled smoking cessation

## 2014-05-20 ENCOUNTER — Ambulatory Visit (INDEPENDENT_AMBULATORY_CARE_PROVIDER_SITE_OTHER): Payer: Medicaid Other | Admitting: General Surgery

## 2014-05-20 ENCOUNTER — Encounter (INDEPENDENT_AMBULATORY_CARE_PROVIDER_SITE_OTHER): Payer: Self-pay | Admitting: General Surgery

## 2014-05-20 ENCOUNTER — Other Ambulatory Visit: Payer: Self-pay | Admitting: Emergency Medicine

## 2014-05-20 VITALS — BP 145/86 | HR 81 | Temp 98.1°F | Resp 22 | Ht 60.0 in | Wt 210.8 lb

## 2014-05-20 DIAGNOSIS — K8020 Calculus of gallbladder without cholecystitis without obstruction: Secondary | ICD-10-CM

## 2014-05-20 NOTE — Progress Notes (Signed)
Patient ID: Stephanie Schultz, female   DOB: 1956/08/01, 58 y.o.   MRN: [id number]  Chief Complaint  Patient presents with  . Abdominal Pain    HPI Stephanie Schultz is a 58 y.o. female.  The patient is a 58 year old female who is referred by Dr. Almyra Free for evaluation of gallstones. 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 The patient recently underwent ultrasound which reveals multiple gallstones within the gallbladder. Patient comes in today with chief complaint of epigastric pain. She has been treated for GERD and recently had her Nexium increased. She states that her pain is not related to meals if she can remember. She states the pain in the epigastrium occurs throughout the day he had not after she eats, she'll states the pain is referred for left upper quadrant.Marland Kitchen  HPI  Past Medical History  Diagnosis Date  . Hypertension   . Anxiety   . Emphysema   . COPD (chronic obstructive pulmonary disease)   . OSA on CPAP     CPAP at night   . Dizziness     pt believes this is motion sickness or vertigo  . GERD (gastroesophageal reflux disease)   . Headache(784.0)   . Cancer 1990    cervical   . Anemia   . Morbid obesity   . Sickle cell trait   . Hx of cardiac catheterization     a. LHC at [facility] in [place] 09/2008:  Normal coronary arteries EF 70%.  . Tobacco abuse     a. up to 3ppd from age 49 to 59, now 1/4 ppd (01/2013)  . Shortness of breath   . Pneumonia   . Diabetes mellitus without complication     Past Surgical History  Procedure Laterality Date  . Tubal ligation    . Cardiac catheterization    . Colonoscopy  09/05/2012    Procedure: COLONOSCOPY;  Surgeon: Beryle Beams, MD;  Location: [facility];  Service: Endoscopy;  Laterality: N/A;    Family History  Problem Relation Age of Onset  . Lung cancer Paternal Aunt   . Lung cancer Paternal Grandfather   . Other Father     unaware  of father's medical history  . Diabetes Mother     alive @ 13  . Myasthenia gravis Mother   . Other      multiple siblings a&w.    Social History History  Substance Use Topics  . Smoking status: Current Every Day Smoker -- 0.25 packs/day for 36 years    Types: Cigarettes  . Smokeless tobacco: Never Used     Comment: Approx 90 pk-yrs (up to 3ppd until ~ 2009). Smoking 3 cigs per day now.  . Alcohol Use: No    No Known Allergies  Current Outpatient Prescriptions  Medication Sig Dispense Refill  . albuterol (PROVENTIL HFA;VENTOLIN HFA) 108 (90 BASE) MCG/ACT inhaler Inhale 2 puffs into the lungs every 4 (four) hours as needed for wheezing.  1 Inhaler  3  . albuterol (PROVENTIL) (2.5 MG/3ML) 0.083% nebulizer solution INHALE CONTENTS OF 1 VIAL IN NEBULIZER 4 TIMES A DAY  300 mL  1  . aspirin EC 81 MG tablet Take 81 mg by mouth daily.      . budesonide-formoterol (SYMBICORT) 160-4.5 MCG/ACT inhaler Inhale 2 puffs into the lungs 2 (two) times daily.      Marland Kitchen doxycycline (VIBRA-TABS) 100 MG tablet 2 today then one daily  8 tablet  0  . esomeprazole (NEXIUM) 20 MG capsule Take 20 mg by mouth daily at 12 noon.      . gabapentin (NEURONTIN) 300 MG capsule Take 1 capsule (300 mg total) by mouth 2 (two) times daily.  60 capsule  6  . glipiZIDE (GLUCOTROL) 10 MG tablet Take 10 mg by mouth daily before breakfast.      . HYDROcodone-acetaminophen (NORCO) 5-325 MG per tablet Take 1 tablet by mouth every 4 (four) hours as needed for severe pain.  10 tablet  0  . ipratropium (ATROVENT) 0.02 % nebulizer solution Take 2.5 mLs (0.5 mg total) by nebulization 4 (four) times daily.  360 mL  6  . loratadine (CLARITIN) 10 MG tablet Take 10 mg by mouth daily.      Marland Kitchen SPIRIVA HANDIHALER 18 MCG inhalation capsule inhale the contents of one capsule in the handihaler once daily  30 capsule  5  . tiotropium (SPIRIVA) 18 MCG inhalation capsule Place 18 mcg into inhaler and inhale daily.      Marland Kitchen zolpidem (AMBIEN) 10 MG  tablet Take 10 mg by mouth at bedtime.       No current facility-administered medications for this visit.    Review of Systems Review of Systems  Constitutional: Negative.   HENT: Negative.   Respiratory: Negative.   Cardiovascular: Negative.   Gastrointestinal: Positive for nausea and abdominal pain.  Neurological: Negative.   All other systems reviewed and are negative.   Blood pressure 145/86, pulse 81, temperature 98.1 F (36.7 C), temperature source Temporal, resp. rate 22, height 5' (1.524 m), weight 210 lb 12.8 oz (95.618 kg), last menstrual period 06/04/2000.  Physical Exam Physical Exam  Constitutional: She is oriented to person, place, and time. She appears well-developed and well-nourished.  HENT:  Head: Normocephalic and atraumatic.  Eyes: Conjunctivae and EOM are normal. Pupils are equal, round, and reactive to light.  Neck: Normal range of motion. Neck supple.  Cardiovascular: Normal rate, regular rhythm and normal heart sounds.   Pulmonary/Chest: Effort normal and breath sounds normal.  Abdominal: Soft. Bowel sounds are normal. She exhibits no distension. There is no tenderness. There is no rebound.  Musculoskeletal: Normal range of motion.  Neurological: She is alert and oriented to person, place, and time.  Skin: Skin is warm and dry.  Psychiatric: She has a normal mood and affect.    Data Reviewed Ultrasound with multiple gallstones  Assessment    58 year old female with cholelithiasis     Plan    1. At this time the patient let's see if increasing her Nexium will help with her reflux and epigastric pain. Long discussion with her that it is difficult to discern whether or not her pain is coming from her gallstones hypertension GERD. I discussed that secondary to her comorbidities she would be a higher risk for operative intervention and potentially could require postoperative stay in the hospital secondary to her pulmonary issues. 2. Patient in followup  if her pain is not resolving and we can discuss cholecystectomy.        Rosario Jacks., Petrice Beedy 05/20/2014, 10:23 AM

## 2014-06-02 ENCOUNTER — Encounter: Payer: Self-pay | Admitting: Nurse Practitioner

## 2014-06-02 ENCOUNTER — Ambulatory Visit (INDEPENDENT_AMBULATORY_CARE_PROVIDER_SITE_OTHER): Payer: Medicaid Other | Admitting: Nurse Practitioner

## 2014-06-02 VITALS — BP 131/75 | HR 75 | Temp 98.0°F | Ht 60.0 in | Wt 214.0 lb

## 2014-06-02 DIAGNOSIS — E1342 Other specified diabetes mellitus with diabetic polyneuropathy: Secondary | ICD-10-CM

## 2014-06-02 DIAGNOSIS — E1142 Type 2 diabetes mellitus with diabetic polyneuropathy: Secondary | ICD-10-CM

## 2014-06-02 DIAGNOSIS — G5600 Carpal tunnel syndrome, unspecified upper limb: Secondary | ICD-10-CM

## 2014-06-02 DIAGNOSIS — G5603 Carpal tunnel syndrome, bilateral upper limbs: Secondary | ICD-10-CM

## 2014-06-02 DIAGNOSIS — E1149 Type 2 diabetes mellitus with other diabetic neurological complication: Secondary | ICD-10-CM

## 2014-06-02 MED ORDER — GABAPENTIN 300 MG PO CAPS: 300.0000 mg | ORAL_CAPSULE | Freq: Three times a day (TID) | ORAL | Status: DC

## 2014-06-02 NOTE — Patient Instructions (Signed)
May increase Gabapentin 300 mg to 1 capsule 3 times a day. Return in about 6 months, sooner as needed.  Call with any problems or questions.  Neuropathic Pain We often think that pain has a physical cause. If we get rid of the cause, the pain should go away. Nerves themselves can also cause pain. It is called neuropathic pain, which means nerve abnormality. It may be difficult for the patients who have it and for the treating caregivers. Pain is usually described as acute (short-lived) or chronic (long-lasting). Acute pain is related to the physical sensations caused by an injury. It can last from a few seconds to many weeks, but it usually goes away when normal healing occurs. Chronic pain lasts beyond the typical healing time. With neuropathic pain, the nerve fibers themselves may be damaged or injured. They then send incorrect signals to other pain centers. The pain you feel is real, but the cause is not easy to find.  CAUSES  Chronic pain can result from diseases, such as diabetes and shingles (an infection related to chickenpox), or from trauma, surgery, or amputation. It can also happen without any known injury or disease. The nerves are sending pain messages, even though there is no identifiable cause for such messages.   Other common causes of neuropathy include diabetes, phantom limb pain, or Regional Pain Syndrome (RPS).  As with all forms of chronic back pain, if neuropathy is not correctly treated, there can be a number of associated problems that lead to a downward cycle for the patient. These include depression, sleeplessness, feelings of fear and anxiety, limited social interaction and inability to do normal daily activities or work.  The most dramatic and mysterious example of neuropathic pain is called "phantom limb syndrome." This occurs when an arm or a leg has been removed because of illness or injury. The brain still gets pain messages from the nerves that originally carried impulses  from the missing limb. These nerves now seem to misfire and cause troubling pain.  Neuropathic pain often seems to have no cause. It responds poorly to standard pain treatment. Neuropathic pain can occur after:  Shingles (herpes zoster virus infection).  A lasting burning sensation of the skin, caused usually by injury to a peripheral nerve.  Peripheral neuropathy which is widespread nerve damage, often caused by diabetes or alcoholism.  Phantom limb pain following an amputation.  Facial nerve problems (trigeminal neuralgia).  Multiple sclerosis.  Reflex sympathetic dystrophy.  Pain which comes with cancer and cancer chemotherapy.  Entrapment neuropathy such as when pressure is put on a nerve such as in carpal tunnel syndrome.  Back, leg, and hip problems (sciatica).  Spine or back surgery.  HIV Infection or AIDS where nerves are infected by viruses. Your caregiver can explain items in the above list which may apply to you. SYMPTOMS  Characteristics of neuropathic pain are:  Severe, sharp, electric shock-like, shooting, lightening-like, knife-like.  Pins and needles sensation.  Deep burning, deep cold, or deep ache.  Persistent numbness, tingling, or weakness.  Pain resulting from light touch or other stimulus that would not usually cause pain.  Increased sensitivity to something that would normally cause pain, such as a pinprick. Pain may persist for months or years following the healing of damaged tissues. When this happens, pain signals no longer sound an alarm about current injuries or injuries about to happen. Instead, the alarm system itself is not working correctly.  Neuropathic pain may get worse instead of better over time.  For some people, it can lead to serious disability. It is important to be aware that severe injury in a limb can occur without a proper, protective pain response.Burns, cuts, and other injuries may go unnoticed. Without proper treatment, these  injuries can become infected or lead to further disability. Take any injury seriously, and consult your caregiver for treatment. DIAGNOSIS  When you have a pain with no known cause, your caregiver will probably ask some specific questions:   Do you have any other conditions, such as diabetes, shingles, multiple sclerosis, or HIV infection?  How would you describe your pain? (Neuropathic pain is often described as shooting, stabbing, burning, or searing.)  Is your pain worse at any time of the day? (Neuropathic pain is usually worse at night.)  Does the pain seem to follow a certain physical pathway?  Does the pain come from an area that has missing or injured nerves? (An example would be phantom limb pain.)  Is the pain triggered by minor things such as rubbing against the sheets at night? These questions often help define the type of pain involved. Once your caregiver knows what is happening, treatment can begin. Anticonvulsant, antidepressant drugs, and various pain relievers seem to work in some cases. If another condition, such as diabetes is involved, better management of that disorder may relieve the neuropathic pain.  TREATMENT  Neuropathic pain is frequently long-lasting and tends not to respond to treatment with narcotic type pain medication. It may respond well to other drugs such as antiseizure and antidepressant medications. Usually, neuropathic problems do not completely go away, but partial improvement is often possible with proper treatment. Your caregivers have large numbers of medications available to treat you. Do not be discouraged if you do not get immediate relief. Sometimes different medications or a combination of medications will be tried before you receive the results you are hoping for. See your caregiver if you have pain that seems to be coming from nowhere and does not go away. Help is available.  SEEK IMMEDIATE MEDICAL CARE IF:   There is a sudden change in the  quality of your pain, especially if the change is on only one side of the body.  You notice changes of the skin, such as redness, black or purple discoloration, swelling, or an ulcer.  You cannot move the affected limbs. Document Released: 07/26/2004 Document Revised: 01/21/2012 Document Reviewed: 07/26/2004 [facility] Patient Information 2015 [place], [place]. This information is not intended to replace advice given to you by your health care provider. Make sure you discuss any questions you have with your health care provider.

## 2014-06-02 NOTE — Progress Notes (Signed)
PATIENT: Stephanie Schultz DOB: November 12, 1956  REASON FOR VISIT: routine follow up for neuropathy in hands HISTORY FROM: patient  HISTORY OF PRESENT ILLNESS: 58 year old right-handed female here for evaluation of bilateral hand and foot pain and numbness and swelling.   5 years ago patient developed pain and numbness in her hands, was evaluated with EMG nerve conduction in [place], diagnosed carpal tunnel syndrome. She was treated with cortisone injections and splints without relief. Now patient continues to have symptoms, getting worse. She also has swelling in her hand joints and toes. She was using diagnosed with gout and started on Indocin. Patient also has numbness and tingling in her toes.  Patient has diabetes with morning sugars running in the 80-100 range.   Update 06/02/14 (LL): Sine last visit, patient has had a good response to gabapentin. She states she can actually feel her fingertips now, but still has some pain in her hands. She is very pleased with current treatment.  REVIEW OF SYSTEMS: Full 14 system review of systems performed and notable only for headache, numbness, anxiety, change in appetite, blurred vision, eye itching, chest pain, nausea, ringing in ears.   ALLERGIES: No Known Allergies  HOME MEDICATIONS: Outpatient Prescriptions Prior to Visit  Medication Sig Dispense Refill  . albuterol (PROVENTIL HFA;VENTOLIN HFA) 108 (90 BASE) MCG/ACT inhaler Inhale 2 puffs into the lungs every 4 (four) hours as needed for wheezing.  1 Inhaler  3  . albuterol (PROVENTIL) (2.5 MG/3ML) 0.083% nebulizer solution INHALE CONTENTS OF 1 VIAL IN NEBULIZER 4 TIMES A DAY  300 mL  1  . aspirin EC 81 MG tablet Take 81 mg by mouth daily.      . budesonide-formoterol (SYMBICORT) 160-4.5 MCG/ACT inhaler Inhale 2 puffs into the lungs 2 (two) times daily.      Marland Kitchen esomeprazole (NEXIUM) 20 MG capsule Take 20 mg by mouth daily at 12 noon.      Marland Kitchen glipiZIDE (GLUCOTROL) 10 MG tablet Take 10 mg by  mouth daily before breakfast.      . HYDROcodone-acetaminophen (NORCO) 5-325 MG per tablet Take 1 tablet by mouth every 4 (four) hours as needed for severe pain.  10 tablet  0  . ipratropium (ATROVENT) 0.02 % nebulizer solution Take 2.5 mLs (0.5 mg total) by nebulization 4 (four) times daily.  360 mL  6  . loratadine (CLARITIN) 10 MG tablet Take 10 mg by mouth daily.      Marland Kitchen PROAIR HFA 108 (90 BASE) MCG/ACT inhaler inhale 2 puffs by mouth every 4 hours if needed for wheezing  8.5 g  3  . SPIRIVA HANDIHALER 18 MCG inhalation capsule inhale the contents of one capsule in the handihaler once daily  30 capsule  5  . tiotropium (SPIRIVA) 18 MCG inhalation capsule Place 18 mcg into inhaler and inhale daily.      Marland Kitchen zolpidem (AMBIEN) 10 MG tablet Take 10 mg by mouth at bedtime.      . gabapentin (NEURONTIN) 300 MG capsule Take 1 capsule (300 mg total) by mouth 2 (two) times daily.  60 capsule  6  . doxycycline (VIBRA-TABS) 100 MG tablet 2 today then one daily  8 tablet  0   No facility-administered medications prior to visit.    PHYSICAL EXAM Filed Vitals:   06/02/14 0828  BP: 131/75  Pulse: 75  Temp: 98 F (36.7 C)  TempSrc: Oral  Height: 5' (1.524 m)  Weight: 214 lb (97.07 kg)   Body mass index is 41.79  kg/(m^2).  Generalized: Well developed, in no acute distress.  On portable O2, 2L.  Head: normocephalic and atraumatic. Oropharynx benign  Neck: Supple, no carotid bruits  Cardiac: Regular rate rhythm, no murmur  Musculoskeletal: No deformity   Neurological examination  Mentation: Alert oriented to time, place, history taking. Follows all commands speech and language fluent Cranial nerve II-XII: Fundoscopic exam not done. Pupils were equal round reactive to light extraocular movements were full, visual field were full on confrontational test. Facial sensation and strength were normal. hearing was intact to finger rubbing bilaterally. Uvula tongue midline. head turning and shoulder shrug  and were normal and symmetric.Tongue protrusion into cheek strength was normal. Motor: The motor testing reveals 5 over 5 strength of all 4 extremities. Good symmetric motor tone is noted throughout. EXCEPT LIMITED IN GRIP (4) DUE TO PAIN  Sensory: VIB 5-6 SEC AT TOES; DECR PP IN FEET. POSITIVE PHALENS. NEG TINELS.  Coordination: Cerebellar testing reveals good finger-nose-finger and heel-to-shin bilaterally.  Gait and station: narrow based gait; SLOW AND CAUTIOUS.  Reflexes: BUE TRACE, KNEES TRACE, ANKLES 0   ASSESSMENT: 58 y.o. year old female here with diabetes, COPD, gout, here for evaluation of bilateral hand numbness and tingling, bilateral foot numbness, bilateral hand and foot swelling.   DIAGNOSIS:  - Her symptoms are likely due to combination of diabetic neuropathy, carpal tunnel syndrome and gout.   PLAN:  - Patient is reluctant to undergo additional EMG nerve conduction testing, cortisone injections, surgery. We will treat conservatively with gabapentin.  May increase Gabapentin 300 mg to 1 capsule 3 times a day. Return in about 6 months, sooner as needed.  Call with any problems or questions.  Meds ordered this encounter  Medications  . gabapentin (NEURONTIN) 300 MG capsule    Sig: Take 1 capsule (300 mg total) by mouth 3 (three) times daily.    Dispense:  90 capsule    Refill:  6    Order Specific Question:  Supervising Provider    Answer:  Penni Bombard [[id number]]   Return in about 6 months (around 12/03/2014) for neuropathy.  Rudi Rummage Zainab Crumrine, MSN, FNP-BC, A/GNP-C 06/02/2014, 8:57 AM [facility] [address], [place],  [address] [phone]  Note: This document was prepared with digital dictation and possible smart phrase technology. Any transcriptional errors that result from this process are unintentional.

## 2014-06-04 NOTE — Progress Notes (Signed)
I reviewed note and agree with plan.   Toi Stelly R. Sereen Schaff, MD  Certified in Neurology, Neurophysiology and Neuroimaging  [facility] [address], [address] [place], [place] [address] [phone]   

## 2014-07-05 ENCOUNTER — Emergency Department (HOSPITAL_COMMUNITY): Payer: Medicaid Other

## 2014-07-05 ENCOUNTER — Encounter (HOSPITAL_COMMUNITY): Payer: Self-pay | Admitting: Emergency Medicine

## 2014-07-05 ENCOUNTER — Emergency Department (HOSPITAL_COMMUNITY)
Admission: EM | Admit: 2014-07-05 | Discharge: 2014-07-05 | Disposition: A | Discharge status: Home / Self Care | Payer: Medicaid Other | Attending: Emergency Medicine | Admitting: Emergency Medicine

## 2014-07-05 DIAGNOSIS — E119 Type 2 diabetes mellitus without complications: Secondary | ICD-10-CM | POA: Diagnosis not present

## 2014-07-05 DIAGNOSIS — IMO0002 Reserved for concepts with insufficient information to code with codable children: Secondary | ICD-10-CM | POA: Diagnosis not present

## 2014-07-05 DIAGNOSIS — R0602 Shortness of breath: Secondary | ICD-10-CM | POA: Insufficient documentation

## 2014-07-05 DIAGNOSIS — J438 Other emphysema: Secondary | ICD-10-CM | POA: Insufficient documentation

## 2014-07-05 DIAGNOSIS — Z79899 Other long term (current) drug therapy: Secondary | ICD-10-CM | POA: Insufficient documentation

## 2014-07-05 DIAGNOSIS — E669 Obesity, unspecified: Secondary | ICD-10-CM | POA: Diagnosis not present

## 2014-07-05 DIAGNOSIS — Z8541 Personal history of malignant neoplasm of cervix uteri: Secondary | ICD-10-CM | POA: Diagnosis not present

## 2014-07-05 DIAGNOSIS — K219 Gastro-esophageal reflux disease without esophagitis: Secondary | ICD-10-CM | POA: Diagnosis not present

## 2014-07-05 DIAGNOSIS — Z9889 Other specified postprocedural states: Secondary | ICD-10-CM | POA: Insufficient documentation

## 2014-07-05 DIAGNOSIS — G4733 Obstructive sleep apnea (adult) (pediatric): Secondary | ICD-10-CM | POA: Insufficient documentation

## 2014-07-05 DIAGNOSIS — F17200 Nicotine dependence, unspecified, uncomplicated: Secondary | ICD-10-CM | POA: Insufficient documentation

## 2014-07-05 DIAGNOSIS — F411 Generalized anxiety disorder: Secondary | ICD-10-CM | POA: Diagnosis not present

## 2014-07-05 DIAGNOSIS — R51 Headache: Secondary | ICD-10-CM | POA: Insufficient documentation

## 2014-07-05 DIAGNOSIS — I10 Essential (primary) hypertension: Secondary | ICD-10-CM | POA: Diagnosis not present

## 2014-07-05 DIAGNOSIS — Z8701 Personal history of pneumonia (recurrent): Secondary | ICD-10-CM | POA: Diagnosis not present

## 2014-07-05 DIAGNOSIS — Z9981 Dependence on supplemental oxygen: Secondary | ICD-10-CM | POA: Insufficient documentation

## 2014-07-05 DIAGNOSIS — Z862 Personal history of diseases of the blood and blood-forming organs and certain disorders involving the immune mechanism: Secondary | ICD-10-CM | POA: Insufficient documentation

## 2014-07-05 DIAGNOSIS — J441 Chronic obstructive pulmonary disease with (acute) exacerbation: Secondary | ICD-10-CM

## 2014-07-05 LAB — I-STAT TROPONIN, ED: TROPONIN I, POC: 0 ng/mL (ref 0.00–0.08)

## 2014-07-05 LAB — BASIC METABOLIC PANEL
ANION GAP: 15 (ref 5–15)
BUN: 17 mg/dL (ref 6–23)
CHLORIDE: 103 meq/L (ref 96–112)
CO2: 24 mEq/L (ref 19–32)
CREATININE: 0.96 mg/dL (ref 0.50–1.10)
Calcium: 9.7 mg/dL (ref 8.4–10.5)
GFR calc Af Amer: 76 mL/min — ABNORMAL LOW (ref >90)
GFR, EST NON AFRICAN AMERICAN: 65 mL/min — AB (ref >90)
Glucose, Bld: 118 mg/dL — ABNORMAL HIGH (ref 70–99)
POTASSIUM: 3.7 meq/L (ref 3.7–5.3)
Sodium: 142 mEq/L (ref 137–147)

## 2014-07-05 LAB — CBC
HCT: 34.5 % — ABNORMAL LOW (ref 36.0–46.0)
HEMOGLOBIN: 12 g/dL (ref 12.0–15.0)
MCH: 29.2 pg (ref 26.0–34.0)
MCHC: 34.8 g/dL (ref 30.0–36.0)
MCV: 83.9 fL (ref 78.0–100.0)
PLATELETS: 231 10*3/uL (ref 150–400)
RBC: 4.11 MIL/uL (ref 3.87–5.11)
RDW: 14.2 % (ref 11.5–15.5)
WBC: 6.1 10*3/uL (ref 4.0–10.5)

## 2014-07-05 LAB — PRO B NATRIURETIC PEPTIDE: Pro B Natriuretic peptide (BNP): 16.1 pg/mL (ref 0–125)

## 2014-07-05 MED ORDER — PREDNISONE (PAK) 10 MG PO TABS: ORAL_TABLET | ORAL | Status: DC

## 2014-07-05 MED ORDER — METHYLPREDNISOLONE SODIUM SUCC 125 MG IJ SOLR
125.0000 mg | Freq: Once | INTRAMUSCULAR | Status: AC
  Administered 2014-07-05: 125 mg via INTRAVENOUS
  Filled 2014-07-05: qty 2

## 2014-07-05 MED ORDER — ALBUTEROL (5 MG/ML) CONTINUOUS INHALATION SOLN
10.0000 mg/h | INHALATION_SOLUTION | Freq: Once | RESPIRATORY_TRACT | Status: AC
  Administered 2014-07-05: 10 mg/h via RESPIRATORY_TRACT
  Filled 2014-07-05: qty 20

## 2014-07-05 NOTE — ED Provider Notes (Signed)
CSN: [id number]     Arrival date & time 07/05/14  1140 History   First MD Initiated Contact with Patient 07/05/14 1154     Chief Complaint  Patient presents with  . Shortness of Breath    Patient is a 58 y.o. female presenting with shortness of breath. The history is provided by the patient.  Shortness of Breath Severity:  Moderate Onset quality:  Gradual Duration:  4 days Timing:  Intermittent Progression:  Worsening Context: activity   Worsened by:  Activity Associated symptoms: cough and headaches   Associated symptoms: no abdominal pain, no chest pain, no fever, no rash, no swollen glands and no vomiting   She has noticed that walking short distances will make her get short of breath. Usually she can walk without trouble when she has her oxygen.  Now she has noticed that walking around in her house she has been getting short of breath.  She has been taking all of her medications.  She is worried that she has fluid on her lungs.  She continues to smoke cigarettes although not as much as before.  Past Medical History  Diagnosis Date  . Hypertension   . Anxiety   . Emphysema   . COPD (chronic obstructive pulmonary disease)   . OSA on CPAP     CPAP at night   . Dizziness     pt believes this is motion sickness or vertigo  . GERD (gastroesophageal reflux disease)   . Headache(784.0)   . Cancer 1990    cervical   . Anemia   . Morbid obesity   . Sickle cell trait   . Hx of cardiac catheterization     a. LHC at [facility] in [place], [place] 09/2008:  Normal coronary arteries EF 70%.  . Tobacco abuse     a. up to 3ppd from age 58 to 65, now 1/4 ppd (01/2013)  . Shortness of breath   . Pneumonia   . Diabetes mellitus without complication    Past Surgical History  Procedure Laterality Date  . Tubal ligation    . Cardiac catheterization    . Colonoscopy  09/05/2012    Procedure: COLONOSCOPY;  Surgeon: Beryle Beams, MD;  Location: [facility] ENDOSCOPY;  Service: Endoscopy;  Laterality: N/A;    Family History  Problem Relation Age of Onset  . Lung cancer Paternal Aunt   . Lung cancer Paternal Grandfather   . Other Father     unaware of father's medical history  . Diabetes Mother     alive @ 49  . Myasthenia gravis Mother   . Other      multiple siblings a&w.   History  Substance Use Topics  . Smoking status: Current Every Day Smoker -- 0.25 packs/day for 36 years    Types: Cigarettes  . Smokeless tobacco: Never Used     Comment: Approx 90 pk-yrs (up to 3ppd until ~ 2009). Smoking 3 cigs per day now.  . Alcohol Use: No   OB History   Grav Para Term Preterm Abortions TAB SAB Ect Mult Living                 Review of Systems  Constitutional: Negative for fever.  Respiratory: Positive for cough and shortness of breath.   Cardiovascular: Negative for chest pain.  Gastrointestinal: Negative for vomiting and abdominal pain.  Skin: Negative for rash.  Neurological: Positive for headaches.  All other systems reviewed and are negative.     Allergies  Review of patient's allergies indicates no known allergies.  Home Medications   Prior to Admission medications   Medication Sig Start Date End Date Taking? Authorizing Provider  albuterol (PROVENTIL HFA;VENTOLIN HFA) 108 (90 BASE) MCG/ACT inhaler Inhale 2 puffs into the lungs every 4 (four) hours as needed for wheezing. 03/11/13  Yes Collene Gobble, MD  albuterol (PROVENTIL) (2.5 MG/3ML) 0.083% nebulizer solution Take 2.5 mg by nebulization 4 (four) times daily.   Yes Historical Provider, MD  budesonide-formoterol (SYMBICORT) 160-4.5 MCG/ACT inhaler Inhale 2 puffs into the lungs 2 (two) times daily.   Yes Historical Provider, MD  gabapentin (NEURONTIN) 300 MG capsule Take 300 mg by mouth 2 (two) times daily.   Yes Historical Provider, MD  glipiZIDE (GLUCOTROL) 10 MG tablet Take 10 mg by mouth daily before breakfast.   Yes Historical Provider, MD  hyoscyamine (ANASPAZ) 0.125 MG TBDP disintergrating tablet Place 0.125 mg  under the tongue 4 (four) times daily.   Yes Historical Provider, MD  indomethacin (INDOCIN) 25 MG capsule Take 25 mg by mouth 2 (two) times daily with a meal.   Yes Historical Provider, MD  ipratropium (ATROVENT) 0.02 % nebulizer solution Take 2.5 mLs (0.5 mg total) by nebulization 4 (four) times daily. 02/02/14  Yes Collene Gobble, MD  loratadine (CLARITIN) 10 MG tablet Take 10 mg by mouth daily.   Yes Historical Provider, MD  omeprazole (PRILOSEC) 40 MG capsule Take 40 mg by mouth daily.   Yes Historical Provider, MD  tiotropium (SPIRIVA) 18 MCG inhalation capsule Place 18 mcg into inhaler and inhale daily.   Yes Historical Provider, MD  triamterene-hydrochlorothiazide (DYAZIDE) 37.5-25 MG per capsule Take 1 capsule by mouth daily.   Yes Historical Provider, MD  zolpidem (AMBIEN) 10 MG tablet Take 10 mg by mouth at bedtime.   Yes Historical Provider, MD  predniSONE (STERAPRED UNI-PAK) 10 MG tablet Take 6 tabs by mouth daily  for 2 days, then 5 tabs for 2 days, then 4 tabs for 2 days, then 3 tabs for 2 days, 2 tabs for 2 days, then 1 tab by mouth daily for 2 days 07/05/14   Dorie Rank, MD   BP 126/66  Pulse 91  Temp(Src) 98.1 F (36.7 C) (Oral)  Resp 20  SpO2 95%  LMP 06/04/2000 Physical Exam  Nursing note and vitals reviewed. Constitutional: She appears well-developed and well-nourished. No distress.  Obese   HENT:  Head: Normocephalic and atraumatic.  Right Ear: External ear normal.  Left Ear: External ear normal.  Eyes: Conjunctivae are normal. Right eye exhibits no discharge. Left eye exhibits no discharge. No scleral icterus.  Neck: Neck supple. No tracheal deviation present.  Cardiovascular: Normal rate, regular rhythm and intact distal pulses.   Pulmonary/Chest: Effort normal. No accessory muscle usage or stridor. No respiratory distress. She has decreased breath sounds. She has wheezes. She has no rales.  Abdominal: Soft. Bowel sounds are normal. She exhibits no distension. There  is no tenderness. There is no rebound and no guarding.  Musculoskeletal: She exhibits no edema and no tenderness.  Neurological: She is alert. She has normal strength. No cranial nerve deficit (no facial droop, extraocular movements intact, no slurred speech) or sensory deficit. She exhibits normal muscle tone. She displays no seizure activity. Coordination normal.  Skin: Skin is warm and dry. No rash noted.  Psychiatric: She has a normal mood and affect.    ED Course  Procedures (including critical care time) Labs Review Labs Reviewed  BASIC METABOLIC PANEL -  Abnormal; Notable for the following:    Glucose, Bld 118 (*)    GFR calc non Af Amer 65 (*)    GFR calc Af Amer 76 (*)    All other components within normal limits  CBC - Abnormal; Notable for the following:    HCT 34.5 (*)    All other components within normal limits  PRO B NATRIURETIC PEPTIDE  I-STAT TROPOININ, ED    Imaging Review Dg Chest 2 View  07/05/2014   CLINICAL DATA:  Shortness of breath.  EXAM: CHEST  2 VIEW  COMPARISON:  10/02/2013  FINDINGS: The cardiac silhouette, mediastinal and hilar contours are within normal limits. Chronic lung changes but no acute overlying pulmonary process. No pleural effusion. The bony thorax is intact.  IMPRESSION: Chronic lung changes but no acute pulmonary findings.   Electronically Signed   By: Kalman Jewels M.D.   On: 07/05/2014 13:08     EKG Interpretation   Date/Time:  Monday July 05 2014 12:00:02 EDT Ventricular Rate:  101 PR Interval:  136 QRS Duration: 91 QT Interval:  364 QTC Calculation: 472 R Axis:   47 Text Interpretation:  Sinus tachycardia Ventricular premature complex Low  voltage, precordial leads Nonspecific T abnormalities, lateral leads  Baseline wander in lead(s) III aVL aVF V1 V2 Artifact No significant  change since last tracing Confirmed by Jahnaya Branscome  MD-J, Cyndee Giammarco ([id number]) on  07/05/2014 12:06:42 PM     Medications  albuterol (PROVENTIL,VENTOLIN) solution  continuous neb (10 mg/hr Nebulization Given 07/05/14 1258)  methylPREDNISolone sodium succinate (SOLU-MEDROL) 125 mg/2 mL injection 125 mg (125 mg Intravenous Given 07/05/14 1231)   1516  Pt is breathing easily.  Feels better after treatment. MDM   Final diagnoses:  COPD exacerbation   Patient's symptoms are related to his COPD exacerbation. She does have evidence of pneumonia or congestive heart failure on her chest x-ray and laboratory tests.  She improved with breathing treatments and steroids. We'll discharge her home on a prednisone taper. I instructed her to follow up with her pulmonary doctor in the next week or so to make sure she is improving. Return as needed for worsening symptoms.   Dorie Rank, MD 07/05/14 [phone]

## 2014-07-05 NOTE — ED Notes (Signed)
Patient is X-RAY

## 2014-07-05 NOTE — Discharge Instructions (Signed)
Chronic Asthmatic Bronchitis Chronic asthmatic bronchitis is a complication of persistent asthma. After a period of time with asthma, some people develop airflow obstruction that is present all the time, even when not having an asthma attack.There is also persistent inflammation of the airways, and the bronchial tubes produce more mucus. Chronic asthmatic bronchitis usually is a permanent problem with the lungs. CAUSES  Chronic asthmatic bronchitis happens most often in people who have asthma and also smoke cigarettes. Occasionally, it can happen to a person with long-standing or severe asthma even if the person is not a smoker. SIGNS AND SYMPTOMS  Chronic asthmatic bronchitis usually causes symptoms of both asthma and chronic bronchitis, including:   Coughing.  Increased sputum production.  Wheezing and shortness of breath.  Chest discomfort.  Recurring infections. DIAGNOSIS  Your health care provider will take a medical history and perform a physical exam. Chronic asthmatic bronchitis is suspected when a person with asthma has abnormal results on breathing tests (pulmonary function tests) even when breathing symptoms are at their best. Other tests, such as a chest X-ray, may be performed to rule out other conditions.  TREATMENT  Treatment involves controlling symptoms with medicine and lifestyle changes.  Your health care provider may prescribe asthma medicines, including inhaler and nebulizer medicines.  Infection can be treated with medicine to kill germs (antibiotics). Serious infections may require hospitalization. These can include:  Pneumonia.  Sinus infections.  Acute bronchitis.   Preventing infection and hospitalization is very important. Get an influenza vaccination every year as directed by your health care provider. Ask your health care provider whether you need a pneumonia vaccine.  Ask your health care provider whether you would benefit from a pulmonary  rehabilitation program. HOME CARE INSTRUCTIONS  Take medicines only as directed by your health care provider.  If you are a cigarette smoker, the most important thing that you can do is quit. Talk to your health care provider for help with quitting smoking.  Avoid pollen, dust, animal dander, molds, smoke, and other things that cause attacks.  Regular exercise is very important to help you feel better. Discuss possible exercise routines with your health care provider.  If animal dander is the cause of asthma, you may not be able to keep pets.  It is important that you:  Become educated about your medical condition.  Participate in maintaining wellness.  Seek medical care as directed. Delay in seeking medical care could cause permanent injury and may be a risk to your life. SEEK MEDICAL CARE IF:  You have wheezing and shortness of breath even if taking medicine to prevent attacks.  You have muscle aches, chest pain, or thickening of sputum.  Your sputum changes from clear or white to yellow, green, gray, or bloody. SEEK IMMEDIATE MEDICAL CARE IF:  Your usual medicines do not stop your wheezing.  You have increased coughing or shortness of breath or both.  You have increased difficulty breathing.  You have any problems from the medicine you are taking, such as a rash, itching, swelling, or trouble breathing. MAKE SURE YOU:   Understand these instructions.  Will watch your condition.  Will get help right away if you are not doing well or get worse. Document Released: 08/16/2006 Document Revised: 03/15/2014 Document Reviewed: 12/07/2013 Exit[employer] Patient Information 2015 [employer]. This information is not intended to replace advice given to you by your health care provider. Make sure you discuss any questions you have with your health care provider.  

## 2014-07-05 NOTE — ED Notes (Signed)
Pt states she has had fluid around her lungs before and she feels like this at the moment, pt having SOB states it comes and goes. States she had pneumonia before and this does not feel like that.

## 2014-07-06 ENCOUNTER — Ambulatory Visit (INDEPENDENT_AMBULATORY_CARE_PROVIDER_SITE_OTHER): Payer: Medicaid Other | Admitting: Emergency Medicine

## 2014-07-06 ENCOUNTER — Encounter: Payer: Self-pay | Admitting: Emergency Medicine

## 2014-07-06 VITALS — BP 132/78 | HR 91 | Temp 98.0°F | Ht 60.0 in | Wt 212.8 lb

## 2014-07-06 DIAGNOSIS — J309 Allergic rhinitis, unspecified: Secondary | ICD-10-CM

## 2014-07-06 DIAGNOSIS — J449 Chronic obstructive pulmonary disease, unspecified: Secondary | ICD-10-CM

## 2014-07-06 DIAGNOSIS — K219 Gastro-esophageal reflux disease without esophagitis: Secondary | ICD-10-CM

## 2014-07-06 DIAGNOSIS — G4733 Obstructive sleep apnea (adult) (pediatric): Secondary | ICD-10-CM

## 2014-07-06 NOTE — Assessment & Plan Note (Signed)
Continue CPAP.  

## 2014-07-06 NOTE — Assessment & Plan Note (Signed)
Continue loratadine 

## 2014-07-06 NOTE — Patient Instructions (Signed)
Please continue your Spiriva and Symbicort Use albuterol as needed Wear your oxygen, and your CPAP at night Continue your omeprazole and you loratadine.  Follow with Dr Lamonte Sakai in 1 month

## 2014-07-06 NOTE — Assessment & Plan Note (Signed)
I do not believe she has an AE - her noise is all UA and there is no wheeze. Will reassure her and continue her usual BDs.

## 2014-07-06 NOTE — Assessment & Plan Note (Signed)
omeprazole

## 2014-07-06 NOTE — Progress Notes (Signed)
Subjective:    Patient ID: Stephanie Schultz, female    DOB: 1956/01/29, 58 y.o.   MRN: [id number]  HPI 58 yo smoker (67 pk-yrs, has cut down to about 2 cig a day), hx HTN, borderline DM, allergies. Dx with COPD at [facility] in [place]. She was started on O2 in ~2009. She had PFT within the last year. She remains fairly active as long as she wears her O2. She can walk about 30-40 feet, has to stop when shopping to rest. She has daily cough, productive of white phlegm. She has rare exacerbations, last was in Jan 2012.   ROV 07/05/11 -- COPD, OSA. Her data from [place] showed she needs CPAP 10. We ordered O2 thru [facility]. She continues to smoke 3 cigs a day. She has been coughing more, having more sputum - yellow/white sputum.  She is having more dyspnea, more wheezing x 3 weeks. She has gained 30+ lbs since coming to [place]. She has not been wearing her O2 reliably.   ROV 09/20/11 -- COPD, OSA. Returns for f/u. Tells me that she has been rx for COPD exacerbation x 2 since our last visit. She tells me that her CXR showed "a spot on one of her lungs". Was done at [facility] ER on 08/23/11. She was treated with pred and azithro on each occasion. She is on Advair and Spiriva. Continues to have severe cough, costochondritic pain. The pred and azithro helped some but not completely. Ran out loratadine 2 weeks ago. Still on omeprazole bid. She is wearing CPAP 10 reliably.   ROV 10/25/11 -- COPD, OSA on CPAP 10. Last time we started [name] in place of Advair to see if this helped w UA sx. Still on Spiriva. Restarted loratadine and continued omeprazole bid. She feels that her breathing is the same, cough is a bit better on the [facility]. No flares, but she is still having nasal gtt despite the loratadine.   12/17/2011 Acute OV  Pt presents for an acute office visit, complains of increased SOB, wheezing, prod cough with yellow-to-brown mucus  2 weeks . Went to ED on 1.23.13 and was given zpak and prednisone 50mg  x 4 days. CXR with  chronic changing. Has finished abx and steroids. Got some better but cough never went away. Still smoking 1/2 PPD (takes O2 off -goes outside to smoke) .  Remains on Dulera and Spiriva  With no missed doses.  OTC not helping.  Wants referral for home health.  Encouraged to quit smoking.   ROV 02/18/12 -- COPD, OSA on CPAP, chronic cough. Had an AE with residual cough as described above 2/13. Remains on Spiriva + Dulera. Remains on omeprazole bid, flonase, not using loratadine right now. Still smoking about 3 cig a day. She is interested in trying the nicotine patches  ROV 07/08/12 -- COPD, OSA on CPAP, chronic cough. She reports that she quit smoking July 3! She has gained 60 lbs over the last year. Her breathing is stable, still limited. She is wearing her O2 and her CPAP. Continues to take Spiriva + Dulera. She reports that she is still having GERD sx even on omeprazole bid. She ran out of flonase 1 month ago. She has had some epistaxis on R.   ROV 11/25/12 -- COPD, OSA on CPAP, chronic cough. Since last time she restarted smoking - currently at 5 cig a day. She ran out of her [name] a week ago. Still taking spiriva. She has been having more cough over about  2 weeks. She has been using albuterol nebs bid - about to run out of it, need refill. She remains on loratadine, fluticasone, omeprazole. She hasn't been using CPAP regularly - needs new mask, this is being arranged. She is to undergo breast bx for a nodule this week.    [facility] follow up 12/19/12 Patient returns for post hospital followup. Patient was admitted January 24-27th 2014 for COPD, exacerbation. She was treated with IV antibiotics, nebulized bronchodilators, and IV steroids.  She was discharged on a steroid taper, along with Levaquin.  Since discharge. She has had  some improvement however, continues to have productive cough, with some thick, yellow. Mucus. Unfortunately patient has restarted smoking. Smoking cessation education was  given. Chest x-ray today shows no acute process Patient denies any hemoptysis, orthopnea, PND, or leg swelling.   03/11/13 -- COPD, OSA on CPAP, chronic cough, tobacco use. Admitted 3/6 - 3/11 for AE-COPD. She feels better. She is having some nasal congestion, on loratadine + nasal steroid. ENT saw her and increased her PPI. She is wearing her CPAP.   ER Follow up 06/12/13 --  pt reports breathing is doing much better since last ED visit 7/10 w SOB given pred and abx but did not take abx-- wearing CPAP 1-2 nights per week-- states it makes her feel claustrophobic-- denies any other concerns at this time Cough and congestion have improved. Shortness, of breath is also decreased. Patient denies any hemoptysis, orthopnea, PND, or increased leg swelling  ROV 12/08/13 -- COPD, OSA on CPAP, chronic cough, tobacco use.  She underwent PFT on 11/16/13 > severe AFL, no BD response, superimposed restriction, decreased DLCO.  Her FEV1 is stable from '09, FVC is decrease slightly. She is upset today because she was unable to be seen on 1/5 when she was here for PFT and was sick. She is also upset that I didn't come to see her when she was admitted to hospital since last Summer. She is still smoking a few cigarettes a day. Remains on Spiriva, symbicort, duonebs prn. Her O2 is set at  2L/min. She is wearing CPAP reliably   ROV 07/06/14 -- follows for severe COPD, OSA, cough, continued tobacco. She was seen by Dr Stephanie Schultz in 6/15, and then again at the ED 8/24 (yesterday) for dyspnea, no chest tightness, no cough. She was given solumedrol and a script for a pred taper > she hasn't filled it.  She has taken her spiriva and symbicort. She is reliable with her CPAP mask.  She is on omeprazole and loratadine.     ROS  See history of present illness   Objective:   Physical Exam Filed Vitals:   07/06/14 1133  BP: 132/78  Pulse: 91  Temp: 98 F (36.7 C)  TempSrc: Oral  Height: 5' (1.524 m)  Weight: 212 lb 12.8 oz  (96.525 kg)  SpO2: 95%    Gen: Angry today, obese, in no distress,  normal affect  ENT: No lesions,  mouth clear,  oropharynx clear, no postnasal drip, hoarse voice  Neck: No JVD, no TMG, no carotid bruits, loud exp stridor and hoarse voice  Lungs: No use of accessory muscles, distant, no wheezes   Cardiovascular: RRR, heart sounds normal, no murmur or gallops, no peripheral edema  Musculoskeletal: No deformities, no cyanosis or clubbing  Neuro: alert, non focal  Skin: Warm, no lesions or rashes, old scars on B UE's   Assessment & Plan:   COPD (chronic obstructive pulmonary disease) I do  not believe she has an AE - her noise is all UA and there is no wheeze. Will reassure her and continue her usual BDs.   Obstructive sleep apnea Continue CPAP.   GERD (gastroesophageal reflux disease) omeprazole  Chronic allergic rhinitis Continue loratadine

## 2014-07-14 ENCOUNTER — Other Ambulatory Visit: Payer: Self-pay | Admitting: Emergency Medicine

## 2014-07-15 ENCOUNTER — Telehealth: Payer: Self-pay | Admitting: Emergency Medicine

## 2014-07-15 MED ORDER — ALBUTEROL SULFATE (2.5 MG/3ML) 0.083% IN NEBU: 2.5000 mg | INHALATION_SOLUTION | Freq: Four times a day (QID) | RESPIRATORY_TRACT | Status: DC

## 2014-07-15 NOTE — Telephone Encounter (Signed)
Pt notified that refill for Albuterol neb med was sent to pharmacy

## 2014-07-29 ENCOUNTER — Ambulatory Visit: Payer: Medicaid Other | Admitting: Internal Medicine

## 2014-08-03 ENCOUNTER — Encounter: Payer: Self-pay | Admitting: Emergency Medicine

## 2014-08-03 ENCOUNTER — Ambulatory Visit (INDEPENDENT_AMBULATORY_CARE_PROVIDER_SITE_OTHER): Payer: Medicaid Other | Admitting: Emergency Medicine

## 2014-08-03 VITALS — BP 130/60 | HR 86 | Temp 98.1°F | Ht 60.0 in | Wt 216.6 lb

## 2014-08-03 DIAGNOSIS — Z23 Encounter for immunization: Secondary | ICD-10-CM

## 2014-08-03 DIAGNOSIS — J449 Chronic obstructive pulmonary disease, unspecified: Secondary | ICD-10-CM

## 2014-08-03 DIAGNOSIS — J309 Allergic rhinitis, unspecified: Secondary | ICD-10-CM

## 2014-08-03 DIAGNOSIS — K219 Gastro-esophageal reflux disease without esophagitis: Secondary | ICD-10-CM

## 2014-08-03 NOTE — Assessment & Plan Note (Signed)
Please continue your Spiriva and Symbicort  Use albuterol as needed  Wear your oxygen at all times Flu shot today Follow with Dr Lamonte Sakai in 3 months or sooner if you have any problems

## 2014-08-03 NOTE — Assessment & Plan Note (Signed)
-   continue omeprazole

## 2014-08-03 NOTE — Progress Notes (Signed)
Subjective:    Patient ID: Stephanie Schultz, female    DOB: [birth year], 58 y.o.   MRN: [id number]  HPI 58 yo smoker (35 pk-yrs, has cut down to about 2 cig a day), hx HTN, borderline DM, allergies. Dx with COPD at [facility] in [place]. She was started on O2 in ~2009. She had PFT within the last year. She remains fairly active as long as she wears her O2. She can walk about 30-40 feet, has to stop when shopping to rest. She has daily cough, productive of white phlegm. She has rare exacerbations, last was in Jan 2012.   ROV 07/05/11 -- COPD, OSA. Her data from [place] showed she needs CPAP 10. We ordered O2 thru [facility]. She continues to smoke 3 cigs a day. She has been coughing more, having more sputum - yellow/white sputum.  She is having more dyspnea, more wheezing x 3 weeks. She has gained 30+ lbs since coming to [place]. She has not been wearing her O2 reliably.   ROV 09/20/11 -- COPD, OSA. Returns for f/u. Tells me that she has been rx for COPD exacerbation x 2 since our last visit. She tells me that her CXR showed "a spot on one of her lungs". Was done at [facility] ER on 08/23/11. She was treated with pred and azithro on each occasion. She is on Advair and Spiriva. Continues to have severe cough, costochondritic pain. The pred and azithro helped some but not completely. Ran out loratadine 2 weeks ago. Still on omeprazole bid. She is wearing CPAP 10 reliably.   ROV 10/25/11 -- COPD, OSA on CPAP 10. Last time we started [facility] in place of Advair to see if this helped w UA sx. Still on Spiriva. Restarted loratadine and continued omeprazole bid. She feels that her breathing is the same, cough is a bit better on the [facility]. No flares, but she is still having nasal gtt despite the loratadine.   12/17/2011 Acute OV  Pt presents for an acute office visit, complains of increased SOB, wheezing, prod cough with yellow-to-brown mucus  2 weeks . Went to ED on 1.23.13 and was given zpak and prednisone 50mg  x 4 days. CXR with  chronic changing. Has finished abx and steroids. Got some better but cough never went away. Still smoking 1/2 PPD (takes O2 off -goes outside to smoke) .  Remains on Dulera and Spiriva  With no missed doses.  OTC not helping.  Wants referral for home health.  Encouraged to quit smoking.   ROV 02/18/12 -- COPD, OSA on CPAP, chronic cough. Had an AE with residual cough as described above 2/13. Remains on Spiriva + Dulera. Remains on omeprazole bid, flonase, not using loratadine right now. Still smoking about 3 cig a day. She is interested in trying the nicotine patches  ROV 07/08/12 -- COPD, OSA on CPAP, chronic cough. She reports that she quit smoking July 3! She has gained 60 lbs over the last year. Her breathing is stable, still limited. She is wearing her O2 and her CPAP. Continues to take Spiriva + Dulera. She reports that she is still having GERD sx even on omeprazole bid. She ran out of flonase 1 month ago. She has had some epistaxis on R.   ROV 11/25/12 -- COPD, OSA on CPAP, chronic cough. Since last time she restarted smoking - currently at 5 cig a day. She ran out of her [name] a week ago. Still taking spiriva. She has been having more cough over about  2 weeks. She has been using albuterol nebs bid - about to run out of it, need refill. She remains on loratadine, fluticasone, omeprazole. She hasn't been using CPAP regularly - needs new mask, this is being arranged. She is to undergo breast bx for a nodule this week.    [facility] follow up 12/19/12 Patient returns for post hospital followup. Patient was admitted January 24-27th 2014 for COPD, exacerbation. She was treated with IV antibiotics, nebulized bronchodilators, and IV steroids.  She was discharged on a steroid taper, along with Levaquin.  Since discharge. She has had  some improvement however, continues to have productive cough, with some thick, yellow. Mucus. Unfortunately patient has restarted smoking. Smoking cessation education was  given. Chest x-ray today shows no acute process Patient denies any hemoptysis, orthopnea, PND, or leg swelling.   03/11/13 -- COPD, OSA on CPAP, chronic cough, tobacco use. Admitted 3/6 - 3/11 for AE-COPD. She feels better. She is having some nasal congestion, on loratadine + nasal steroid. ENT saw her and increased her PPI. She is wearing her CPAP.   ER Follow up 06/12/13 --  pt reports breathing is doing much better since last ED visit 7/10 w SOB given pred and abx but did not take abx-- wearing CPAP 1-2 nights per week-- states it makes her feel claustrophobic-- denies any other concerns at this time Cough and congestion have improved. Shortness, of breath is also decreased. Patient denies any hemoptysis, orthopnea, PND, or increased leg swelling  ROV 12/08/13 -- COPD, OSA on CPAP, chronic cough, tobacco use.  She underwent PFT on 11/16/13 > severe AFL, no BD response, superimposed restriction, decreased DLCO.  Her FEV1 is stable from '09, FVC is decrease slightly. She is upset today because she was unable to be seen on 1/5 when she was here for PFT and was sick. She is also upset that I didn't come to see her when she was admitted to hospital since last Summer. She is still smoking a few cigarettes a day. Remains on Spiriva, symbicort, duonebs prn. Her O2 is set at  2L/min. She is wearing CPAP reliably   ROV 07/06/14 -- follows for severe COPD, OSA, cough, continued tobacco. She was seen by Dr Annamaria Boots in 6/15, and then again at the ED 8/24 (yesterday) for dyspnea, no chest tightness, no cough. She was given solumedrol and a script for a pred taper > she hasn't filled it.  She has taken her spiriva and symbicort. She is reliable with her CPAP mask.  She is on omeprazole and loratadine.    ROV 08/03/14 -- severe COPD, OSA on CPAP, cough, continued tobacco use, about 3 cigarettes daily. Also some component UA disease. She continues to cough and have mid sternal chest pain. She is reliable with her omeprazole  40.  She has been using albuterol.    ROS  See history of present illness   Objective:   Physical Exam Filed Vitals:   08/03/14 0926  BP: 130/60  Pulse: 86  Temp: 98.1 F (36.7 C)  TempSrc: Oral  Height: 5' (1.524 m)  Weight: 216 lb 9.6 oz (98.249 kg)  SpO2: 94%    Gen: Obese, in no distress,  normal affect  ENT: No lesions,  mouth clear,  oropharynx clear, no postnasal drip, hoarse voice  Neck: No JVD, no TMG, no carotid bruits, loud exp stridor and hoarse voice  Lungs: No use of accessory muscles, distant, no wheezes   Cardiovascular: RRR, heart sounds normal, no murmur  or gallops, no peripheral edema  Musculoskeletal: No deformities, no cyanosis or clubbing  Neuro: alert, non focal  Skin: Warm, no lesions or rashes, old scars on B UE's   Assessment & Plan:   COPD (chronic obstructive pulmonary disease) Please continue your Spiriva and Symbicort  Use albuterol as needed  Wear your oxygen at all times Flu shot today Follow with Dr Lamonte Sakai in 3 months or sooner if you have any problems  Chronic allergic rhinitis Try using nasal saline spray 1-2x a day Continue your loratadine   GERD (gastroesophageal reflux disease) continue omeprazole.

## 2014-08-03 NOTE — Assessment & Plan Note (Signed)
Try using nasal saline spray 1-2x a day Continue your loratadine

## 2014-08-03 NOTE — Patient Instructions (Signed)
Please continue your Spiriva and Symbicort  Use albuterol as needed  Wear your CPAP every night Wear your oxygen at all times Try using nasal saline spray 1-2x a day Continue your omeprazole and loratadine  Flu shot today Follow with Dr Lamonte Sakai in 3 months or sooner if you have any problems.

## 2014-08-04 ENCOUNTER — Ambulatory Visit (INDEPENDENT_AMBULATORY_CARE_PROVIDER_SITE_OTHER): Payer: Medicaid Other | Admitting: Podiatry

## 2014-08-04 ENCOUNTER — Encounter: Payer: Self-pay | Admitting: Podiatry

## 2014-08-04 ENCOUNTER — Ambulatory Visit (INDEPENDENT_AMBULATORY_CARE_PROVIDER_SITE_OTHER): Payer: Medicaid Other

## 2014-08-04 DIAGNOSIS — B351 Tinea unguium: Secondary | ICD-10-CM

## 2014-08-04 DIAGNOSIS — E119 Type 2 diabetes mellitus without complications: Secondary | ICD-10-CM

## 2014-08-04 DIAGNOSIS — M79676 Pain in unspecified toe(s): Secondary | ICD-10-CM

## 2014-08-04 DIAGNOSIS — Q828 Other specified congenital malformations of skin: Secondary | ICD-10-CM

## 2014-08-04 DIAGNOSIS — S92912A Unspecified fracture of left toe(s), initial encounter for closed fracture: Secondary | ICD-10-CM

## 2014-08-04 DIAGNOSIS — M79609 Pain in unspecified limb: Secondary | ICD-10-CM

## 2014-08-04 DIAGNOSIS — S92919A Unspecified fracture of unspecified toe(s), initial encounter for closed fracture: Secondary | ICD-10-CM

## 2014-08-04 DIAGNOSIS — M898X9 Other specified disorders of bone, unspecified site: Secondary | ICD-10-CM

## 2014-08-04 NOTE — Patient Instructions (Signed)
Diabetes and Foot Care Diabetes may cause you to have problems because of poor blood supply (circulation) to your feet and legs. This may cause the skin on your feet to become thinner, break easier, and heal more slowly. Your skin may become dry, and the skin may peel and crack. You may also have nerve damage in your legs and feet causing decreased feeling in them. You may not notice minor injuries to your feet that could lead to infections or more serious problems. Taking care of your feet is one of the most important things you can do for yourself.  HOME CARE INSTRUCTIONS  Wear shoes at all times, even in the house. Do not go barefoot. Bare feet are easily injured.  Check your feet daily for blisters, cuts, and redness. If you cannot see the bottom of your feet, use a mirror or ask someone for help.  Wash your feet with warm water (do not use hot water) and mild soap. Then pat your feet and the areas between your toes until they are completely dry. Do not soak your feet as this can dry your skin.  Apply a moisturizing lotion or petroleum jelly (that does not contain alcohol and is unscented) to the skin on your feet and to dry, brittle toenails. Do not apply lotion between your toes.  Trim your toenails straight across. Do not dig under them or around the cuticle. File the edges of your nails with an emery board or nail file.  Do not cut corns or calluses or try to remove them with medicine.  Wear clean socks or stockings every day. Make sure they are not too tight. Do not wear knee-high stockings since they may decrease blood flow to your legs.  Wear shoes that fit properly and have enough cushioning. To break in new shoes, wear them for just a few hours a day. This prevents you from injuring your feet. Always look in your shoes before you put them on to be sure there are no objects inside.  Do not cross your legs. This may decrease the blood flow to your feet.  If you find a minor scrape,  cut, or break in the skin on your feet, keep it and the skin around it clean and dry. These areas may be cleansed with mild soap and water. Do not cleanse the area with peroxide, alcohol, or iodine.  When you remove an adhesive bandage, be sure not to damage the skin around it.  If you have a wound, look at it several times a day to make sure it is healing.  Do not use heating pads or hot water bottles. They may burn your skin. If you have lost feeling in your feet or legs, you may not know it is happening until it is too late.  Make sure your health care provider performs a complete foot exam at least annually or more often if you have foot problems. Report any cuts, sores, or bruises to your health care provider immediately. SEEK MEDICAL CARE IF:   You have an injury that is not healing.  You have cuts or breaks in the skin.  You have an ingrown nail.  You notice redness on your legs or feet.  You feel burning or tingling in your legs or feet.  You have pain or cramps in your legs and feet.  Your legs or feet are numb.  Your feet always feel cold. SEEK IMMEDIATE MEDICAL CARE IF:   There is increasing redness,   swelling, or pain in or around a wound.  There is a red line that goes up your leg.  Pus is coming from a wound.  You develop a fever or as directed by your health care provider.  You notice a bad smell coming from an ulcer or wound. Document Released: 10/26/2000 Document Revised: 07/01/2013 Document Reviewed: 04/07/2013 [facility] Patient Information 2015 [place], [place]. This information is not intended to replace advice given to you by your health care provider. Make sure you discuss any questions you have with your health care provider.  Stress Fracture When too much stress is put on the foot, as may occur in running and jumping sports, the lengthy shafts of the bones of the forefoot become susceptible to breaking due to repetitive stress (stress fracture) because of  thinness of these bone. A stress fracture is more common if osteoporosis is present or if inadequate athletic footwear is used. Shoes should be used which adequately support the sole of the foot to absorb the shocks of the activity participated in. Stress fractures are very common in competitive female runners who develop these small cracks on the surface of the bones in their legs and feet. The women most likely to suffer these injuries are those who restrict food intake and those who have irregular periods. Stress fractures usually start out as a minor discomfort in the foot or leg. The completion of fracture due to repetitive loading often occurs near the end of a long run. The pain may dissipate with rest. With the next exercise session, the pain may return earlier in the run. If an athlete notices that it hurts to touch just one spot on a bone and then stops running for a week, he or she may be tempted to return to running too soon. Often the pain is ignored in order to continue with high impact exercise. A stress fracture then develops. The athlete now has to avoid the hard pounding of running, but can ride a bike or swim for exercise once the pain has resolved with normal weight bearing until the fracture heals in 6-12 weeks. The most common sites for stress fractures are the bones in the front of the feet (metatarsals) and the long bone of the lower leg (tibia), but running can cause stress fractures anywhere in the lower extremities or pelvis. DIAGNOSIS  Usually the diagnosis is made by reviewing the patient's history. The bone involved progressively becomes more painful with activities. X-rays may show no break within the first 2-3 weeks that pain begins. A later X-ray may show signs that the bone is healing. Having a bone scan or MRI usually makes an earlier diagnosis possible. HOME CARE INSTRUCTIONS  Treatment may include a cast or walking shoe.  High impact activities should be stopped until  advised by your caregiver.  Wear shoes with adequate shock absorbing abilities and good support of the sole of the foot. This is especially important in the arch of the foot.  Alternative exercise may be undertaken while waiting for healing. This may include bicycling and swimming. If you do not have a cast or splint:  You may walk on your injured foot as tolerated or advised.  Do not put any weight on your injured foot until instructed. Slowly increase the amount of time you walk on the foot as the pain allows or as advised.  Use crutches until you can bear weight without pain. A gradual increase in weight bearing may help.  Apply ice to the  injured area for the first 2 days after you have been treated or as directed by your caregiver.  Put ice in a plastic bag.  Place a towel between your skin and the bag.  Leave the ice on for 15-20 minutes at a time, every hour while you are awake.  Only take over-the-counter or prescription medicines for pain or discomfort as directed by your caregiver.  If your caregiver has given you a follow-up appointment, it is very important to keep that appointment. Not keeping the appointment could result in a chronic or permanent injury, pain, and disability. SEEK IMMEDIATE MEDICAL CARE IF:   Pain is becoming worse rather than better.  Pain is uncontrolled with medicine.  You have increased swelling or redness in the foot.  The feeling in the foot or leg is diminished. MAKE SURE YOU:   Understand these instructions.  Will watch your condition.  Will get help right away if you are not doing well or get worse. Document Released: 01/19/2003 Document Revised: 02/23/2013 Document Reviewed: 06/14/2008 [facility] Patient Information 2015 [place], [place]. This information is not intended to replace advice given to you by your health care provider. Make sure you discuss any questions you have with your health care provider.

## 2014-08-04 NOTE — Progress Notes (Signed)
   Subjective:    Patient ID: Stephanie Schultz, female    DOB: 1956-04-14, 58 y.o.   MRN: [id number]  HPI Comments: "I have something gong on with my toe"  Patient c/o tenderness 5th toe bilateral for few months. She has a callused area at the lateral corner of the nail on each toe. She has tried to trim down. She was seeing doc at [facility] and they stop seeing Medicaid patients. Also states that her nails are painful particularly with shoe gear as they are elongated. States that she has pain in her toes at times. Denies any recent injury or trauma to the area. Denies any claudication symptoms or any tingling/numbness. No other complaints at this time.  Patient is diabetic. Unknown A1C. She does state that they have been "outrageous" recently running approximately 229.   Toe Pain       Review of Systems  Constitutional: Positive for diaphoresis, appetite change and unexpected weight change.  HENT: Positive for sinus pressure, sore throat and tinnitus.   Eyes: Positive for pain, itching and visual disturbance.  Respiratory: Positive for cough and wheezing.   Gastrointestinal: Positive for constipation.  Endocrine: Positive for polyphagia.  Musculoskeletal: Positive for arthralgias, gait problem and myalgias.  Neurological: Positive for dizziness and headaches.  All other systems reviewed and are negative.      Objective:   Physical Exam AAO x3, NAD DP/PT pulses palpable b/l. CRT < 3 sec Protective sensation intact with Simms Weinstein monofilament, Achilles tendon reflex intact. Nails hypertrophic, dystrophic, elongated, discolored x10. No surrounding erythema or drainage. Adductovarus the fifth digit with associated hyperkeratotic lesions on the dorsal lateral aspect of the fifth toes bilaterally. No the skin underlying. No pinpoint bony tenderness. No open lesions. Mild tenderness to digits on the proximal phalanx left greater than right with mild discomfort over the  proximal phalanx mostly on the second digit. No pain along the metatarsals. No pain with range of motion of MTPJ's. No calf pain, swelling, warmth.      Assessment & Plan:  58 year old female presents for diabetic risk assessment, symptomatic onychomycosis/hyperkeratotic lesions -X-rays were obtained and reviewed with the patient. On the left foot and he can see some evidence of periosteal reaction on the medial aspects of the second, third, fourth proximal phalanx midshaft. These were also present on the x-rays from 10/02/2013. However as the patient does have pain in the toes were mobilized for short period time and a surgical shoe.  -Nails sharply debrided F68 without complications to patient comfort. -Hyperkeratotic lesion sharply debrided x2 without complications. -Discussed the importance of daily foot inspection. -Followup in 3 weeks or sooner if any problems are to arise to recheck symptoms of left foot. In the meantime call any questions, concerns, change in symptoms. -Followup with primary care physician for other issues mentioned in the review of systems as is her chronic and no acute changes.

## 2014-08-27 ENCOUNTER — Ambulatory Visit: Payer: Medicaid Other | Admitting: Podiatry

## 2014-09-01 ENCOUNTER — Ambulatory Visit (INDEPENDENT_AMBULATORY_CARE_PROVIDER_SITE_OTHER): Payer: Medicaid Other

## 2014-09-01 ENCOUNTER — Encounter: Payer: Self-pay | Admitting: Podiatry

## 2014-09-01 ENCOUNTER — Ambulatory Visit (INDEPENDENT_AMBULATORY_CARE_PROVIDER_SITE_OTHER): Payer: Medicaid Other | Admitting: Podiatry

## 2014-09-01 VITALS — BP 137/68 | HR 104 | Resp 20

## 2014-09-01 DIAGNOSIS — S92506A Nondisplaced unspecified fracture of unspecified lesser toe(s), initial encounter for closed fracture: Secondary | ICD-10-CM

## 2014-09-01 DIAGNOSIS — M79673 Pain in unspecified foot: Secondary | ICD-10-CM

## 2014-09-05 NOTE — Progress Notes (Signed)
Patient ID: Stephanie Schultz, female   DOB: 10-Aug-1956, 58 y.o.   MRN: [id number]  Subjective: Stephanie Schultz returns after a fall evaluation of pain in her digits. She states that her toes have been feeling better since wearing the surgical shoe. She denies any acute changes since last appointment. No other complaints at this time. Denies any systemic complaints of fevers, chills, nausea, vomiting.  Objective: AO 3, NAD DP/PT pulses palpable bilaterally, CRT less than 3 seconds  Protective sensation intact with Simplex monofilament, Achilles tendon reflex intact No pain on palpation to the digits bilaterally or pain with vibratory sensation. No pain on palpation or with vibratory sensation to the metatarsals bilaterally. No pain with MTPJ range of motion. No swelling erythema, edema. No open lesions or pre-ulcerative lesions. No calf pain with compression, swelling, warmth.  Assessment: Follow-up of evaluation of bilateral digital pain.  Plan: -Treatment options discussed including alternatives, risks, complications. -Today's appointment x-rays were obtained and reviewed with the patient. -At this time patient can slowly transition out of the surgical shoe and back into a regular shoe. Discussed with her that if symptoms return to go back into the surgical shoe and call the office. Also call there is any increased swelling. -Follow-up in 2 months for routine diabetic risk assessment. In the meantime call the office in the questions, concerns, change in symptoms.

## 2014-09-29 ENCOUNTER — Encounter: Payer: Self-pay | Admitting: Diagnostic Neuroimaging

## 2014-10-13 ENCOUNTER — Telehealth: Payer: Self-pay | Admitting: Emergency Medicine

## 2014-10-13 MED ORDER — ALBUTEROL SULFATE (2.5 MG/3ML) 0.083% IN NEBU: 2.5000 mg | INHALATION_SOLUTION | Freq: Four times a day (QID) | RESPIRATORY_TRACT | Status: DC

## 2014-10-13 NOTE — Telephone Encounter (Signed)
Pt states she needs refill for her albuterol nebulizer meds. Pt aware I am sending refill to [facility] on [address].

## 2014-11-02 ENCOUNTER — Encounter (HOSPITAL_COMMUNITY): Payer: Self-pay

## 2014-11-02 ENCOUNTER — Telehealth: Payer: Self-pay | Admitting: Emergency Medicine

## 2014-11-02 ENCOUNTER — Emergency Department (HOSPITAL_COMMUNITY)
Admission: EM | Admit: 2014-11-02 | Discharge: 2014-11-03 | Disposition: A | Discharge status: Home / Self Care | Payer: Medicaid Other | Attending: Emergency Medicine | Admitting: Emergency Medicine

## 2014-11-02 DIAGNOSIS — Z791 Long term (current) use of non-steroidal anti-inflammatories (NSAID): Secondary | ICD-10-CM | POA: Diagnosis not present

## 2014-11-02 DIAGNOSIS — M47816 Spondylosis without myelopathy or radiculopathy, lumbar region: Secondary | ICD-10-CM

## 2014-11-02 DIAGNOSIS — Z9889 Other specified postprocedural states: Secondary | ICD-10-CM | POA: Diagnosis not present

## 2014-11-02 DIAGNOSIS — Z9981 Dependence on supplemental oxygen: Secondary | ICD-10-CM | POA: Insufficient documentation

## 2014-11-02 DIAGNOSIS — Z7951 Long term (current) use of inhaled steroids: Secondary | ICD-10-CM | POA: Diagnosis not present

## 2014-11-02 DIAGNOSIS — J449 Chronic obstructive pulmonary disease, unspecified: Secondary | ICD-10-CM | POA: Diagnosis not present

## 2014-11-02 DIAGNOSIS — Z720 Tobacco use: Secondary | ICD-10-CM | POA: Insufficient documentation

## 2014-11-02 DIAGNOSIS — Z8541 Personal history of malignant neoplasm of cervix uteri: Secondary | ICD-10-CM | POA: Insufficient documentation

## 2014-11-02 DIAGNOSIS — Z862 Personal history of diseases of the blood and blood-forming organs and certain disorders involving the immune mechanism: Secondary | ICD-10-CM | POA: Insufficient documentation

## 2014-11-02 DIAGNOSIS — K219 Gastro-esophageal reflux disease without esophagitis: Secondary | ICD-10-CM | POA: Diagnosis not present

## 2014-11-02 DIAGNOSIS — M47896 Other spondylosis, lumbar region: Secondary | ICD-10-CM | POA: Diagnosis not present

## 2014-11-02 DIAGNOSIS — Z8701 Personal history of pneumonia (recurrent): Secondary | ICD-10-CM | POA: Diagnosis not present

## 2014-11-02 DIAGNOSIS — E119 Type 2 diabetes mellitus without complications: Secondary | ICD-10-CM | POA: Diagnosis not present

## 2014-11-02 DIAGNOSIS — M545 Low back pain, unspecified: Secondary | ICD-10-CM

## 2014-11-02 DIAGNOSIS — G4733 Obstructive sleep apnea (adult) (pediatric): Secondary | ICD-10-CM | POA: Diagnosis not present

## 2014-11-02 DIAGNOSIS — F419 Anxiety disorder, unspecified: Secondary | ICD-10-CM | POA: Insufficient documentation

## 2014-11-02 DIAGNOSIS — Z79899 Other long term (current) drug therapy: Secondary | ICD-10-CM | POA: Diagnosis not present

## 2014-11-02 DIAGNOSIS — I10 Essential (primary) hypertension: Secondary | ICD-10-CM | POA: Diagnosis not present

## 2014-11-02 NOTE — Telephone Encounter (Signed)
Error

## 2014-11-02 NOTE — ED Notes (Signed)
Pt presents with c/o back pain. Pt reports the pain started approx 3 weeks ago but has gotten worse over the last several days. Pt reports she has barely been able to walk.  Pt did go to the emergency room when she was visiting family in another city and they reported that she pulled a muscle. Pt is on home oxygen.

## 2014-11-03 ENCOUNTER — Emergency Department (HOSPITAL_COMMUNITY): Payer: Medicaid Other

## 2014-11-03 ENCOUNTER — Ambulatory Visit: Payer: Medicaid Other | Admitting: Podiatry

## 2014-11-03 LAB — CBC WITH DIFFERENTIAL/PLATELET
BASOS PCT: 0 % (ref 0–1)
Basophils Absolute: 0 10*3/uL (ref 0.0–0.1)
EOS ABS: 0.3 10*3/uL (ref 0.0–0.7)
Eosinophils Relative: 4 % (ref 0–5)
HCT: 35.8 % — ABNORMAL LOW (ref 36.0–46.0)
Hemoglobin: 12.2 g/dL (ref 12.0–15.0)
LYMPHS ABS: 2.7 10*3/uL (ref 0.7–4.0)
Lymphocytes Relative: 38 % (ref 12–46)
MCH: 28.2 pg (ref 26.0–34.0)
MCHC: 34.1 g/dL (ref 30.0–36.0)
MCV: 82.7 fL (ref 78.0–100.0)
Monocytes Absolute: 0.4 10*3/uL (ref 0.1–1.0)
Monocytes Relative: 6 % (ref 3–12)
Neutro Abs: 3.7 10*3/uL (ref 1.7–7.7)
Neutrophils Relative %: 52 % (ref 43–77)
PLATELETS: 269 10*3/uL (ref 150–400)
RBC: 4.33 MIL/uL (ref 3.87–5.11)
RDW: 14.4 % (ref 11.5–15.5)
WBC: 7.1 10*3/uL (ref 4.0–10.5)

## 2014-11-03 LAB — COMPREHENSIVE METABOLIC PANEL
ALK PHOS: 39 U/L (ref 39–117)
ALT: 35 U/L (ref 0–35)
AST: 31 U/L (ref 0–37)
Albumin: 4.2 g/dL (ref 3.5–5.2)
Anion gap: 8 (ref 5–15)
BUN: 19 mg/dL (ref 6–23)
CALCIUM: 9.3 mg/dL (ref 8.4–10.5)
CO2: 26 mmol/L (ref 19–32)
Chloride: 104 mEq/L (ref 96–112)
Creatinine, Ser: 1.05 mg/dL (ref 0.50–1.10)
GFR calc Af Amer: 67 mL/min — ABNORMAL LOW (ref >90)
GFR calc non Af Amer: 58 mL/min — ABNORMAL LOW (ref >90)
Glucose, Bld: 73 mg/dL (ref 70–99)
Potassium: 3.8 mmol/L (ref 3.5–5.1)
SODIUM: 138 mmol/L (ref 135–145)
TOTAL PROTEIN: 7.8 g/dL (ref 6.0–8.3)
Total Bilirubin: 0.5 mg/dL (ref 0.3–1.2)

## 2014-11-03 LAB — URINALYSIS, ROUTINE W REFLEX MICROSCOPIC
Bilirubin Urine: NEGATIVE
Glucose, UA: NEGATIVE mg/dL
HGB URINE DIPSTICK: NEGATIVE
Ketones, ur: NEGATIVE mg/dL
Leukocytes, UA: NEGATIVE
Nitrite: NEGATIVE
Protein, ur: NEGATIVE mg/dL
SPECIFIC GRAVITY, URINE: 1.01 (ref 1.005–1.030)
Urobilinogen, UA: 0.2 mg/dL (ref 0.0–1.0)
pH: 7 (ref 5.0–8.0)

## 2014-11-03 MED ORDER — MORPHINE SULFATE 4 MG/ML IJ SOLN
6.0000 mg | Freq: Once | INTRAMUSCULAR | Status: AC
  Administered 2014-11-03: 6 mg via INTRAVENOUS
  Filled 2014-11-03: qty 2

## 2014-11-03 MED ORDER — DIAZEPAM 5 MG PO TABS
5.0000 mg | ORAL_TABLET | Freq: Once | ORAL | Status: AC
  Administered 2014-11-03: 5 mg via ORAL
  Filled 2014-11-03: qty 1

## 2014-11-03 MED ORDER — IBUPROFEN 400 MG PO TABS: 400.0000 mg | ORAL_TABLET | Freq: Three times a day (TID) | ORAL | Status: DC | PRN

## 2014-11-03 MED ORDER — METHOCARBAMOL 500 MG PO TABS
500.0000 mg | ORAL_TABLET | Freq: Two times a day (BID) | ORAL | Status: DC
Start: 2014-11-03 — End: 2014-12-21

## 2014-11-03 MED ORDER — KETOROLAC TROMETHAMINE 30 MG/ML IJ SOLN
30.0000 mg | Freq: Once | INTRAMUSCULAR | Status: AC
  Administered 2014-11-03: 30 mg via INTRAVENOUS
  Filled 2014-11-03: qty 1

## 2014-11-03 NOTE — ED Provider Notes (Signed)
CSN: [id number]     Arrival date & time 11/02/14  2337 History   First MD Initiated Contact with Patient 11/03/14 0021     Chief Complaint  Patient presents with  . Back Pain     The history is provided by the patient.   Patient reports low back pain over the past 3 weeks.  No abdominal pain.  Denies nausea vomiting.  Denies weakness in the arms or legs.  Family reports that her pain has been so severe that she's had difficulty getting out of bed secondary to pain.  Difficulty ambulating secondary to pain.  Denies weakness.  No prior history of significant back pain.  No recent back injury that she can think of.  Denies weight loss.  No history of active cancer.  No chest or abdominal pain.  No cough or difficulty breathing.  She tried hydrocodone at home without improvement in her symptoms.  No bowel or bladder complaints   Past Medical History  Diagnosis Date  . Hypertension   . Anxiety   . Emphysema   . COPD (chronic obstructive pulmonary disease)   . OSA on CPAP     CPAP at night   . Dizziness     pt believes this is motion sickness or vertigo  . GERD (gastroesophageal reflux disease)   . Headache(784.0)   . Cancer 1990    cervical   . Anemia   . Morbid obesity   . Sickle cell trait   . Hx of cardiac catheterization     a. LHC at [facility] in [place], [place] 09/2008:  Normal coronary arteries EF 70%.  . Tobacco abuse     a. up to 3ppd from age 79 to 60, now 1/4 ppd (01/2013)  . Shortness of breath   . Pneumonia   . Diabetes mellitus without complication    Past Surgical History  Procedure Laterality Date  . Tubal ligation    . Cardiac catheterization    . Colonoscopy  09/05/2012    Procedure: COLONOSCOPY;  Surgeon: Beryle Beams, MD;  Location: [facility] ENDOSCOPY;  Service: Endoscopy;  Laterality: N/A;   Family History  Problem Relation Age of Onset  . Lung cancer Paternal Aunt   . Lung cancer Paternal Grandfather   . Other Father     unaware of father's medical history  .  Diabetes Mother     alive @ 47  . Myasthenia gravis Mother   . Other      multiple siblings a&w.   History  Substance Use Topics  . Smoking status: Current Every Day Smoker -- 0.25 packs/day for 36 years    Types: Cigarettes  . Smokeless tobacco: Never Used     Comment: Approx 90 pk-yrs (up to 3ppd until ~ 2009). Smoking 3 cigs per day now.  . Alcohol Use: No   OB History    No data available     Review of Systems  All other systems reviewed and are negative.     Allergies  Review of patient's allergies indicates no known allergies.  Home Medications   Prior to Admission medications   Medication Sig Start Date End Date Taking? Authorizing Provider  albuterol (PROVENTIL HFA;VENTOLIN HFA) 108 (90 BASE) MCG/ACT inhaler Inhale 2 puffs into the lungs every 4 (four) hours as needed for wheezing. 03/11/13  Yes Collene Gobble, MD  albuterol (PROVENTIL) (2.5 MG/3ML) 0.083% nebulizer solution Take 3 mLs (2.5 mg total) by nebulization 4 (four) times daily. 10/13/14  Yes  Collene Gobble, MD  allopurinol (ZYLOPRIM) 100 MG tablet Take 100 mg by mouth daily.   Yes Historical Provider, MD  budesonide-formoterol (SYMBICORT) 160-4.5 MCG/ACT inhaler Inhale 2 puffs into the lungs 2 (two) times daily.   Yes Historical Provider, MD  gabapentin (NEURONTIN) 300 MG capsule Take 300 mg by mouth 2 (two) times daily.   Yes Historical Provider, MD  glipiZIDE (GLUCOTROL) 10 MG tablet Take 10 mg by mouth daily before breakfast.   Yes Historical Provider, MD  HYDROcodone-acetaminophen (NORCO/VICODIN) 5-325 MG per tablet Take 1 tablet by mouth every 6 (six) hours as needed. pain   Yes Historical Provider, MD  hyoscyamine (ANASPAZ) 0.125 MG TBDP disintergrating tablet Place 0.125 mg under the tongue 4 (four) times daily.   Yes Historical Provider, MD  indomethacin (INDOCIN) 25 MG capsule Take 25 mg by mouth 2 (two) times daily with a meal.   Yes Historical Provider, MD  ipratropium (ATROVENT) 0.02 % nebulizer  solution Take 2.5 mLs (0.5 mg total) by nebulization 4 (four) times daily. 02/02/14  Yes Collene Gobble, MD  loratadine (CLARITIN) 10 MG tablet Take 10 mg by mouth daily.   Yes Historical Provider, MD  omeprazole (PRILOSEC) 40 MG capsule Take 40 mg by mouth daily.   Yes Historical Provider, MD  tiotropium (SPIRIVA) 18 MCG inhalation capsule Place 18 mcg into inhaler and inhale daily.   Yes Historical Provider, MD  triamterene-hydrochlorothiazide (DYAZIDE) 37.5-25 MG per capsule Take 1 capsule by mouth daily.   Yes Historical Provider, MD  zolpidem (AMBIEN) 10 MG tablet Take 10 mg by mouth at bedtime.   Yes Historical Provider, MD  ibuprofen (ADVIL,MOTRIN) 400 MG tablet Take 1 tablet (400 mg total) by mouth every 8 (eight) hours as needed. 11/03/14   Hoy Morn, MD  methocarbamol (ROBAXIN) 500 MG tablet Take 1 tablet (500 mg total) by mouth 2 (two) times daily. 11/03/14   Hoy Morn, MD   BP 116/62 mmHg  Pulse 86  Temp(Src) 97.9 F (36.6 C) (Oral)  Resp 17  SpO2 99%  LMP 06/04/2000 Physical Exam  Constitutional: She is oriented to person, place, and time. She appears well-developed and well-nourished. No distress.  HENT:  Head: Normocephalic and atraumatic.  Eyes: EOM are normal.  Neck: Normal range of motion.  Cardiovascular: Normal rate, regular rhythm and normal heart sounds.   Pulmonary/Chest: Effort normal and breath sounds normal.  Abdominal: Soft. She exhibits no distension. There is no tenderness.  Musculoskeletal: Normal range of motion.  Lumbar and paralumbar tenderness with some paralumbar spasm.  No thoracic tenderness.  5 out of 5 strength in bilateral lower and upper extremity major muscle groups.  Neurological: She is alert and oriented to person, place, and time.  Skin: Skin is warm and dry.  Psychiatric: She has a normal mood and affect. Judgment normal.  Nursing note and vitals reviewed.   ED Course  Procedures (including critical care time) Labs  Review Labs Reviewed  CBC WITH DIFFERENTIAL - Abnormal; Notable for the following:    HCT 35.8 (*)    All other components within normal limits  COMPREHENSIVE METABOLIC PANEL - Abnormal; Notable for the following:    GFR calc non Af Amer 58 (*)    GFR calc Af Amer 67 (*)    All other components within normal limits  URINALYSIS, ROUTINE W REFLEX MICROSCOPIC    Imaging Review Dg Lumbar Spine Complete  11/03/2014   CLINICAL DATA:  Initial evaluation for severe low back pain.  No injury.  EXAM: LUMBAR SPINE - COMPLETE 4+ VIEW  COMPARISON:  None.  FINDINGS: Vertebral bodies are normally aligned with preservation of the normal lumbar lordosis paravertebral body heights are well preserved. No acute fracture listhesis.  Advanced degenerative disc disease as evidenced by intervertebral disc space narrowing, endplate sclerosis, and osteophytosis seen throughout the thoracolumbar spine, most severe at L3-4 and L5-S1. There are prominent bulky bridging anterior osteophytes at the thoracolumbar junction. Multilevel severe facet arthropathy present.  No paraspinal soft tissue abnormality.  IMPRESSION: 1. Severe multilevel degenerative disc disease with prominent bridging anterior osteophytic spurring and multilevel facet arthropathy. Changes are most severe at T11-12 and L3-4 through L5-S1. 2. No acute abnormality within the lumbar spine.   Electronically Signed   By: Jeannine Boga M.D.   On: 11/03/2014 01:59  I personally reviewed the imaging tests through PACS system I reviewed available ER/hospitalization records through the EMR    EKG Interpretation None      MDM   Final diagnoses:  Low back pain  Lumbar spondylosis, unspecified spinal osteoarthritis    3:23 AM Patient feels much better at this time after pain medication.  Normal strength in her lower kidneys.  This appears to be an exacerbation of arthritic lumbar spine disease.  Discharge home with muscle relaxants and  anti-inflammatories.  She gives hydrocodone from her primary care physician on a monthly basis and therefore prescribing her additional opioids will null and void her contract with her provider.  I've asked that she call her primary care physician this morning for close follow-up.  No indication for advanced imaging.  Doubt spinal epidural abscess.  No abdominal pain.  Doubt AAA.  Overall well-appearing.  Improvement in the ER.  She understands to return to the ER for new or worsening symptoms    Hoy Morn, MD 11/03/14 [phone]

## 2014-11-03 NOTE — Discharge Instructions (Signed)

## 2014-11-22 ENCOUNTER — Telehealth: Payer: Self-pay | Admitting: Emergency Medicine

## 2014-11-22 MED ORDER — ALBUTEROL SULFATE (2.5 MG/3ML) 0.083% IN NEBU: 2.5000 mg | INHALATION_SOLUTION | Freq: Four times a day (QID) | RESPIRATORY_TRACT | Status: DC

## 2014-11-22 NOTE — Telephone Encounter (Signed)
Called pt and is aware albuterol neb has been refilled. Nothing further needed

## 2014-12-03 ENCOUNTER — Ambulatory Visit: Payer: Medicaid Other | Admitting: Nurse Practitioner

## 2014-12-06 ENCOUNTER — Ambulatory Visit: Payer: Medicaid Other | Admitting: Diagnostic Neuroimaging

## 2014-12-09 ENCOUNTER — Other Ambulatory Visit: Payer: Self-pay | Admitting: Emergency Medicine

## 2014-12-09 ENCOUNTER — Telehealth: Payer: Self-pay | Admitting: Emergency Medicine

## 2014-12-09 MED ORDER — IPRATROPIUM BROMIDE 0.02 % IN SOLN: 0.5000 mg | Freq: Four times a day (QID) | RESPIRATORY_TRACT | Status: DC

## 2014-12-09 NOTE — Telephone Encounter (Signed)
Rx has been sent in. Pt is aware. 

## 2014-12-13 ENCOUNTER — Ambulatory Visit (INDEPENDENT_AMBULATORY_CARE_PROVIDER_SITE_OTHER): Payer: Self-pay | Admitting: Diagnostic Neuroimaging

## 2014-12-13 ENCOUNTER — Encounter: Payer: Self-pay | Admitting: Diagnostic Neuroimaging

## 2014-12-13 VITALS — BP 137/64 | HR 106 | Ht 60.0 in | Wt 216.4 lb

## 2014-12-13 DIAGNOSIS — G5603 Carpal tunnel syndrome, bilateral upper limbs: Secondary | ICD-10-CM

## 2014-12-13 DIAGNOSIS — G5602 Carpal tunnel syndrome, left upper limb: Secondary | ICD-10-CM

## 2014-12-13 DIAGNOSIS — M1990 Unspecified osteoarthritis, unspecified site: Secondary | ICD-10-CM

## 2014-12-13 DIAGNOSIS — E1342 Other specified diabetes mellitus with diabetic polyneuropathy: Secondary | ICD-10-CM

## 2014-12-13 DIAGNOSIS — G5601 Carpal tunnel syndrome, right upper limb: Secondary | ICD-10-CM

## 2014-12-13 DIAGNOSIS — M109 Gout, unspecified: Secondary | ICD-10-CM

## 2014-12-13 NOTE — Patient Instructions (Signed)
Follow up with Dr. Jonelle Sidle.  Consider rheumatology evaluation.

## 2014-12-13 NOTE — Progress Notes (Signed)
PATIENT: Trenyce Loera DOB: Feb 16, 1956  REASON FOR VISIT: routine follow up for neuropathy in hands HISTORY FROM: patient  Chief Complaint  Patient presents with  . Follow-up    diabetic neruopathy     HISTORY OF PRESENT ILLNESS:  UPDATE 12/13/14 (VRP): Now pt having more joint pain, joint stiffness, in fingers, hands, wrists, hips, ankles. No sig numbness. Also struggling with headaches x 2 weeks, in setting of recovery from sinus infection.   UPDATE 06/02/14 (LL): Since last visit, patient has had a good response to gabapentin. She states she can actually feel her fingertips now, but still has some pain in her hands. She is very pleased with current treatment.  PRIOR HPI (03/03/14, VRP): 59 year old right-handed female here for evaluation of bilateral hand and foot pain and numbness and swelling. 5 years ago patient developed pain and numbness in her hands, was evaluated with EMG nerve conduction in [place], diagnosed carpal tunnel syndrome. She was treated with cortisone injections and splints without relief. Now patient continues to have symptoms, getting worse. She also has swelling in her hand joints and toes. She was using diagnosed with gout and started on Indocin. Patient also has numbness and tingling in her toes. Patient has diabetes with morning sugars running in the 80-100 range.    REVIEW OF SYSTEMS: Full 14 system review of systems performed and notable only for chills blurred vision light sens nausea constipation neck pain back pain dizziness headache numbness.   ALLERGIES: No Known Allergies  HOME MEDICATIONS: Outpatient Prescriptions Prior to Visit  Medication Sig Dispense Refill  . albuterol (PROVENTIL HFA;VENTOLIN HFA) 108 (90 BASE) MCG/ACT inhaler Inhale 2 puffs into the lungs every 4 (four) hours as needed for wheezing. 1 Inhaler 3  . albuterol (PROVENTIL) (2.5 MG/3ML) 0.083% nebulizer solution Take 3 mLs (2.5 mg total) by nebulization 4 (four) times daily.  DX J44.9 360 mL 2  . allopurinol (ZYLOPRIM) 100 MG tablet Take 100 mg by mouth daily.    . budesonide-formoterol (SYMBICORT) 160-4.5 MCG/ACT inhaler Inhale 2 puffs into the lungs 2 (two) times daily.    Marland Kitchen gabapentin (NEURONTIN) 300 MG capsule Take 300 mg by mouth 2 (two) times daily.    Marland Kitchen glipiZIDE (GLUCOTROL) 10 MG tablet Take 10 mg by mouth daily before breakfast.    . hyoscyamine (ANASPAZ) 0.125 MG TBDP disintergrating tablet Place 0.125 mg under the tongue 4 (four) times daily.    Marland Kitchen ibuprofen (ADVIL,MOTRIN) 400 MG tablet Take 1 tablet (400 mg total) by mouth every 8 (eight) hours as needed. 12 tablet 0  . indomethacin (INDOCIN) 25 MG capsule Take 25 mg by mouth 2 (two) times daily with a meal.    . ipratropium (ATROVENT) 0.02 % nebulizer solution Take 2.5 mLs (0.5 mg total) by nebulization 4 (four) times daily. 360 mL 1  . loratadine (CLARITIN) 10 MG tablet Take 10 mg by mouth daily.    . methocarbamol (ROBAXIN) 500 MG tablet Take 1 tablet (500 mg total) by mouth 2 (two) times daily. 20 tablet 0  . omeprazole (PRILOSEC) 40 MG capsule Take 40 mg by mouth daily.    Marland Kitchen tiotropium (SPIRIVA) 18 MCG inhalation capsule Place 18 mcg into inhaler and inhale daily.    Marland Kitchen triamterene-hydrochlorothiazide (DYAZIDE) 37.5-25 MG per capsule Take 1 capsule by mouth daily.    Marland Kitchen zolpidem (AMBIEN) 10 MG tablet Take 10 mg by mouth at bedtime.    Marland Kitchen HYDROcodone-acetaminophen (NORCO/VICODIN) 5-325 MG per tablet Take 1 tablet by mouth  every 6 (six) hours as needed. pain     No facility-administered medications prior to visit.    PHYSICAL EXAM Filed Vitals:   12/13/14 1115  BP: 137/64  Pulse: 106  Height: 5' (1.524 m)  Weight: 216 lb 6.4 oz (98.158 kg)   Body mass index is 42.26 kg/(m^2).  Generalized: Well developed, in no acute distress.  On portable O2, 2L.  Neck: Supple, no carotid bruits  Cardiac: Regular rate rhythm, no murmur   Neurological examination  Mentation: Alert oriented to time, place,  history taking. Follows all commands speech and language fluent Cranial nerve II-XII: NO PAPILLEDEMA; Pupils were equal round reactive to light extraocular movements were full, visual field were full on confrontational test. Facial sensation and strength were normal. hearing was intact to finger rubbing bilaterally. Uvula tongue midline. head turning and shoulder shrug and were normal and symmetric.Tongue protrusion into cheek strength was normal. Motor: The motor testing reveals 5 over 5 strength of all 4 extremities. Good symmetric motor tone is noted throughout. EXCEPT LIMITED IN GRIP (4) DUE TO PAIN  Sensory: VIB 5-6 SEC AT TOES; DECR PP IN FEET.  Coordination: Cerebellar testing reveals good finger-nose-finger bilaterally.  Gait and station: narrow based gait; SLOW AND CAUTIOUS.  Reflexes: BUE TRACE, KNEES TRACE, ANKLES 0     ASSESSMENT:  59 y.o. female here with diabetes, COPD, gout, here for evaluation of bilateral hand numbness and tingling, bilateral foot numbness, bilateral hand and foot swelling.   DIAGNOSIS: diabetic neuropathy (mild), carpal tunnel syndrome (mild), joint pain (related to arthritis, gout; more severe)  PLAN:  - continue pain mgmt per PCP; consider rheumatology eval for joint issues, which seem to be more severe than her neuropathy or CTS issues - continue gabapentin; titrate up as tolerated per PCP or pain mgmt - f/u with PCP re: sinus infx and headache; neuro exam unremarkable today; if HA increases or neuro sxs develop, then consider CT or MRI brain  Return if symptoms worsen or fail to improve, for return to PCP.   Penni Bombard, MD 12/19/7822, 23:53 AM Certified in Neurology, Neurophysiology and Neuroimaging  [facility] [name], [place], [place] [address] [phone]

## 2014-12-21 ENCOUNTER — Ambulatory Visit: Payer: Medicaid Other | Admitting: Emergency Medicine

## 2014-12-21 ENCOUNTER — Encounter: Payer: Self-pay | Admitting: Pulmonary Disease

## 2014-12-21 ENCOUNTER — Ambulatory Visit (INDEPENDENT_AMBULATORY_CARE_PROVIDER_SITE_OTHER)
Admission: RE | Admit: 2014-12-21 | Discharge: 2014-12-21 | Disposition: A | Discharge status: Home / Self Care | Payer: Medicaid Other | Source: Ambulatory Visit | Attending: Pulmonary Disease | Admitting: Pulmonary Disease

## 2014-12-21 ENCOUNTER — Ambulatory Visit (INDEPENDENT_AMBULATORY_CARE_PROVIDER_SITE_OTHER): Payer: Medicaid Other | Admitting: Pulmonary Disease

## 2014-12-21 VITALS — BP 118/68 | HR 93 | Temp 97.0°F | Ht 60.0 in | Wt 215.0 lb

## 2014-12-21 DIAGNOSIS — J438 Other emphysema: Secondary | ICD-10-CM

## 2014-12-21 DIAGNOSIS — J441 Chronic obstructive pulmonary disease with (acute) exacerbation: Secondary | ICD-10-CM

## 2014-12-21 MED ORDER — CEFDINIR 300 MG PO CAPS: ORAL_CAPSULE | ORAL | Status: DC

## 2014-12-21 MED ORDER — PREDNISONE 10 MG PO TABS: ORAL_TABLET | ORAL | Status: DC

## 2014-12-21 NOTE — Patient Instructions (Signed)
omnicef 300mg , take 2 each am for 5 days Prednisone taper over 8 days Will check chest xray today, and call you with results. Keep followup with [name], but let us know if you are not getting better in 2-3 days.

## 2014-12-21 NOTE — Progress Notes (Signed)
   Subjective:    Patient ID: Stephanie Schultz, female    DOB: 06/30/1956, 59 y.o.   MRN: [id number]  HPI Patient comes in today for an acute sick visit. She is normally followed by [name] for severe COPD with chronic respiratory failure. Unfortunately, she continues to smoke, and gives a 3 to four-day history of increasing chest congestion, cough with yellow mucus, and increased shortness of breath. She has been compliant on her bronchodilator regimen and oxygen according to her history. She also complains of some sharp discomfort under both shoulder blades, but it is not necessarily with inspiration. She has had chronic lower extremity edema, but it is not worse than usual. She denies any calf tenderness.   Review of Systems  Constitutional: Negative for fever and unexpected weight change.  HENT: Positive for congestion. Negative for dental problem, ear pain, nosebleeds, postnasal drip, rhinorrhea, sinus pressure, sneezing, sore throat and trouble swallowing.   Eyes: Negative for redness and itching.  Respiratory: Positive for apnea, cough, shortness of breath and wheezing. Negative for chest tightness.   Cardiovascular: Positive for leg swelling. Negative for palpitations.  Gastrointestinal: Negative for nausea and vomiting.  Genitourinary: Negative for dysuria.  Musculoskeletal: Negative for joint swelling.  Skin: Negative for rash.  Neurological: Negative for headaches.  Hematological: Does not bruise/bleed easily.  Psychiatric/Behavioral: Negative for dysphoric mood. The patient is not nervous/anxious.        Objective:   Physical Exam Morbidly obese female in no acute distress. Smells of cigarette smoke. Nose without purulence or discharge noted Neck without lymphadenopathy or thyromegaly Chest with decreased depth of inspiration, scattered wheezes. Cardiac exam with regular rate and rhythm Lower extremities with edema noted, no cyanosis Alert and oriented, moves all 4  extremities.       Assessment & Plan:

## 2014-12-21 NOTE — Assessment & Plan Note (Signed)
The patient's history is most consistent with an acute bronchitis leading to an early COPD exacerbation. She has a congested cough during my exam, and has wheezes on auscultation.  She is complaining of some sharp discomfort in her back bilaterally, and does have some chronic lower extremity edema. If she is not improving in the next 48 hours or so, I would have a low threshold for evaluating for thromboembolic disease. We'll check a chest x-ray today for completeness to rule out an infiltrate or possible pneumothorax.

## 2014-12-29 ENCOUNTER — Telehealth: Payer: Self-pay | Admitting: Emergency Medicine

## 2014-12-29 NOTE — Telephone Encounter (Signed)
Pt needs ov tomorrow with cxr before visit.  She can see anyone who has opening.

## 2014-12-29 NOTE — Telephone Encounter (Signed)
Called and spoke with pt and she is going to try and set up transportation for appt tomorrow with [place].  appt scheduled at 3:45 with RB.  Pt to call back to confirm this appt.  Will need to let the ladies up front know that this appt has been confirmed when pt calls back.

## 2014-12-29 NOTE — Telephone Encounter (Signed)
Pt seen by [facility] 12/21/14       Patient Instructions     omnicef 300mg , take 2 each am for 5 days Prednisone taper over 8 days Will check chest xray today, and call you with results. Keep followup with [name], but let us know if you are not getting better in 2-3 days.    Pt states that she is not feeling any better, pt is having pain in front torso below both breast and in upper back. Reports that pain is present all the time, worse with coughing and taking deep breaths. Pt states that she is not having any increased SOB or cough.  Pt c/o still wheezing at times.  Pt completed abx on Sunday 12/26/14 and took last dose of Prednisone today.  Would like to know what to do next.  Please advise Dr Gwenette Greet as you saw the patient 12/21/14 and Dr Lamonte Sakai is not available this morning. thanks

## 2014-12-30 ENCOUNTER — Other Ambulatory Visit: Payer: Self-pay | Admitting: Emergency Medicine

## 2014-12-30 ENCOUNTER — Ambulatory Visit: Payer: Medicaid Other | Admitting: Emergency Medicine

## 2014-12-30 ENCOUNTER — Ambulatory Visit
Admission: RE | Admit: 2014-12-30 | Discharge: 2014-12-30 | Disposition: A | Discharge status: Home / Self Care | Payer: Medicaid Other | Source: Ambulatory Visit | Attending: Emergency Medicine | Admitting: Emergency Medicine

## 2014-12-30 ENCOUNTER — Telehealth: Payer: Self-pay

## 2014-12-30 DIAGNOSIS — J438 Other emphysema: Secondary | ICD-10-CM

## 2014-12-30 DIAGNOSIS — J441 Chronic obstructive pulmonary disease with (acute) exacerbation: Secondary | ICD-10-CM

## 2014-12-30 NOTE — Telephone Encounter (Signed)
lmtcb x1 

## 2014-12-30 NOTE — Telephone Encounter (Signed)
Spoke with pt. States that she will be coming to her appointment today with RB. Nothing further was needed.

## 2014-12-30 NOTE — Telephone Encounter (Signed)
Pt had 3:45 appt today with RB for an acute visit: see yesterday's telephone note.  Pt arrived, was instructed to get a chest x-ray shortly after arrival.  Pt went down to x-ray, came back up about 30 minutes later saying that the line was long and she would be picked up between 4:45 and 5:15 by her transportation, and had 5 minutes to get downstairs when they called her or else she would be left.  I spoke with RB and per RB advised pt to head back downstairs before her transportation got here to obtain x-ray so we could call in appropriate meds based on her cxr.  Pt was unhappy, Crystal was called to the lobby to also speak to the pt.  Pt finally agreed to go down to x-ray, only to leave the building.  I made RB aware.  Based on pt's symptoms and just finishing a pred taper on Sunday, a cxr is important for pt.  Pt has appt on 2/29 with RB and per RB he does not want to call in another pred taper at this time and wants a cxr before he calls something in for pt.  He states he will address this with her at this appt.  Nothing further needed at this time.

## 2015-01-10 ENCOUNTER — Encounter: Payer: Self-pay | Admitting: Emergency Medicine

## 2015-01-10 ENCOUNTER — Ambulatory Visit (INDEPENDENT_AMBULATORY_CARE_PROVIDER_SITE_OTHER)
Admission: RE | Admit: 2015-01-10 | Discharge: 2015-01-10 | Disposition: A | Discharge status: Home / Self Care | Payer: Medicaid Other | Source: Ambulatory Visit | Attending: Emergency Medicine | Admitting: Emergency Medicine

## 2015-01-10 ENCOUNTER — Ambulatory Visit (INDEPENDENT_AMBULATORY_CARE_PROVIDER_SITE_OTHER): Payer: Medicaid Other | Admitting: Emergency Medicine

## 2015-01-10 VITALS — BP 120/62 | HR 66 | Ht 60.0 in | Wt 213.0 lb

## 2015-01-10 DIAGNOSIS — J438 Other emphysema: Secondary | ICD-10-CM

## 2015-01-10 DIAGNOSIS — G4733 Obstructive sleep apnea (adult) (pediatric): Secondary | ICD-10-CM

## 2015-01-10 NOTE — Assessment & Plan Note (Signed)
Continue CPAP Need to work on exercoise and wt loss

## 2015-01-10 NOTE — Assessment & Plan Note (Signed)
Stable severe disease, still smokes. Compounded by obesity and her UA disease.  - will continue spiriva + symbicort - titrate her walking o2 today - add back flutic asone to loratadine and omeprazole for her UA sx

## 2015-01-10 NOTE — Progress Notes (Signed)
Subjective:    Patient ID: Stephanie Schultz, female    DOB: 07/30/1956, 59 y.o.   MRN: [id number]  HPI 59 yo smoker (90 pk-yrs, has cut down to about 2 cig a day), hx HTN, borderline DM, allergies. Dx with COPD at [facility] in [place]. She was started on O2 in ~2009. She had PFT within the last year. She remains fairly active as long as she wears her O2. She can walk about 30-40 feet, has to stop when shopping to rest. She has daily cough, productive of white phlegm. She has rare exacerbations, last was in Jan 2012.   ROV 07/05/11 -- COPD, OSA. Her data from [place] showed she needs CPAP 10. We ordered O2 thru [facility]. She continues to smoke 3 cigs a day. She has been coughing more, having more sputum - yellow/white sputum.  She is having more dyspnea, more wheezing x 3 weeks. She has gained 30+ lbs since coming to [place]. She has not been wearing her O2 reliably.   ROV 09/20/11 -- COPD, OSA. Returns for f/u. Tells me that she has been rx for COPD exacerbation x 2 since our last visit. She tells me that her CXR showed "a spot on one of her lungs". Was done at [facility] ER on 08/23/11. She was treated with pred and azithro on each occasion. She is on Advair and Spiriva. Continues to have severe cough, costochondritic pain. The pred and azithro helped some but not completely. Ran out loratadine 2 weeks ago. Still on omeprazole bid. She is wearing CPAP 10 reliably.   ROV 10/25/11 -- COPD, OSA on CPAP 10. Last time we started [facility] in place of Advair to see if this helped w UA sx. Still on Spiriva. Restarted loratadine and continued omeprazole bid. She feels that her breathing is the same, cough is a bit better on the [facility]. No flares, but she is still having nasal gtt despite the loratadine.   12/17/2011 Acute OV  Pt presents for an acute office visit, complains of increased SOB, wheezing, prod cough with yellow-to-brown mucus  2 weeks . Went to ED on 1.23.13 and was given zpak and prednisone 50mg  x 4 days. CXR with  chronic changing. Has finished abx and steroids. Got some better but cough never went away. Still smoking 1/2 PPD (takes O2 off -goes outside to smoke) .  Remains on Dulera and Spiriva  With no missed doses.  OTC not helping.  Wants referral for home health.  Encouraged to quit smoking.   ROV 02/18/12 -- COPD, OSA on CPAP, chronic cough. Had an AE with residual cough as described above 2/13. Remains on Spiriva + Dulera. Remains on omeprazole bid, flonase, not using loratadine right now. Still smoking about 3 cig a day. She is interested in trying the nicotine patches  ROV 07/08/12 -- COPD, OSA on CPAP, chronic cough. She reports that she quit smoking July 3! She has gained 60 lbs over the last year. Her breathing is stable, still limited. She is wearing her O2 and her CPAP. Continues to take Spiriva + Dulera. She reports that she is still having GERD sx even on omeprazole bid. She ran out of flonase 1 month ago. She has had some epistaxis on R.   ROV 11/25/12 -- COPD, OSA on CPAP, chronic cough. Since last time she restarted smoking - currently at 5 cig a day. She ran out of her [name] a week ago. Still taking spiriva. She has been having more cough over about  2 weeks. She has been using albuterol nebs bid - about to run out of it, need refill. She remains on loratadine, fluticasone, omeprazole. She hasn't been using CPAP regularly - needs new mask, this is being arranged. She is to undergo breast bx for a nodule this week.    [facility] follow up 12/19/12 Patient returns for post hospital followup. Patient was admitted January 24-27th 2014 for COPD, exacerbation. She was treated with IV antibiotics, nebulized bronchodilators, and IV steroids.  She was discharged on a steroid taper, along with Levaquin.  Since discharge. She has had  some improvement however, continues to have productive cough, with some thick, yellow. Mucus. Unfortunately patient has restarted smoking. Smoking cessation education was  given. Chest x-ray today shows no acute process Patient denies any hemoptysis, orthopnea, PND, or leg swelling.   03/11/13 -- COPD, OSA on CPAP, chronic cough, tobacco use. Admitted 3/6 - 3/11 for AE-COPD. She feels better. She is having some nasal congestion, on loratadine + nasal steroid. ENT saw her and increased her PPI. She is wearing her CPAP.   ER Follow up 06/12/13 --  pt reports breathing is doing much better since last ED visit 7/10 w SOB given pred and abx but did not take abx-- wearing CPAP 1-2 nights per week-- states it makes her feel claustrophobic-- denies any other concerns at this time Cough and congestion have improved. Shortness, of breath is also decreased. Patient denies any hemoptysis, orthopnea, PND, or increased leg swelling  ROV 12/08/13 -- COPD, OSA on CPAP, chronic cough, tobacco use.  She underwent PFT on 11/16/13 > severe AFL, no BD response, superimposed restriction, decreased DLCO.  Her FEV1 is stable from '09, FVC is decrease slightly. She is upset today because she was unable to be seen on 1/5 when she was here for PFT and was sick. She is also upset that I didn't come to see her when she was admitted to hospital since last Summer. She is still smoking a few cigarettes a day. Remains on Spiriva, symbicort, duonebs prn. Her O2 is set at  2L/min. She is wearing CPAP reliably   ROV 07/06/14 -- follows for severe COPD, OSA, cough, continued tobacco. She was seen by Dr Annamaria Boots in 6/15, and then again at the ED 8/24 (yesterday) for dyspnea, no chest tightness, no cough. She was given solumedrol and a script for a pred taper > she hasn't filled it.  She has taken her spiriva and symbicort. She is reliable with her CPAP mask.  She is on omeprazole and loratadine.    ROV 08/03/14 -- severe COPD, OSA on CPAP, cough, continued tobacco use, about 3 cigarettes daily. Also some component UA disease. She continues to cough and have midfollow up for  sternal chest pain. She is reliable with her  omeprazole 40.  She has been using albuterol.   ROV 01/10/15 -- follow up for severe COPD OSA, cough and tobacco use.  She follows up today following treatment for an apparent AE of her COPD and UA disease. She is doing a lot of coughing, causing mid abd and back soreness. She c/o some baseline soreness and chest tightness. She has loratdine, has not been nasal steroid for several weeks. She is on omeprazole.    ROS  See history of present illness   Objective:   Physical Exam Filed Vitals:   01/10/15 1330  BP: 120/62  Pulse: 66  Height: 5' (1.524 m)  Weight: 213 lb (96.616 kg)  SpO2: 98%  Gen: Obese, in no distress,  normal affect  ENT: No lesions,  mouth clear,  oropharynx clear, no postnasal drip, hoarse voice  Neck: No JVD, no TMG, no carotid bruits, loud exp stridor and hoarse voice  Lungs: No use of accessory muscles, distant, no wheezes   Cardiovascular: RRR, heart sounds normal, no murmur or gallops, no peripheral edema  Musculoskeletal: No deformities, no cyanosis or clubbing  Neuro: alert, non focal  Skin: Warm, no lesions or rashes, old scars on B UE's   Assessment & Plan:   COPD (chronic obstructive pulmonary disease) Stable severe disease, still smokes. Compounded by obesity and her UA disease.  - will continue spiriva + symbicort - titrate her walking o2 today - add back flutic asone to loratadine and omeprazole for her UA sx    Obstructive sleep apnea Continue CPAP Need to work on exercoise and wt loss

## 2015-01-10 NOTE — Patient Instructions (Signed)
Your CXR looks clear today Stay on your usual inhaled medications.  Stay on loratadine and omeprazole as you are taking them  Start fluticasone nasal spray, 2 sprays each side twice a day Wear your oxygen at all times.  Follow with Dr Lamonte Sakai in 3 months or sooner if you have any problems.

## 2015-01-11 ENCOUNTER — Telehealth: Payer: Self-pay | Admitting: Emergency Medicine

## 2015-01-11 MED ORDER — FLUTICASONE PROPIONATE 50 MCG/ACT NA SUSP: 2.0000 | Freq: Two times a day (BID) | NASAL | Status: DC

## 2015-01-11 NOTE — Telephone Encounter (Signed)
Saw where flonase was started yesterday at ov, not sent in to pharmacy.  E-scribed this to pharmacy.  lmtcb X1 to make pt aware.

## 2015-01-12 NOTE — Telephone Encounter (Signed)
Called and spoke to pt. Informed pt of the med sent in. Pt verbalized understanding and denied any further questions or concerns at this time.

## 2015-01-24 ENCOUNTER — Other Ambulatory Visit: Payer: Self-pay | Admitting: Emergency Medicine

## 2015-02-10 ENCOUNTER — Telehealth: Payer: Self-pay

## 2015-02-10 NOTE — Telephone Encounter (Signed)
02/10/15 Disc Received from [facility] and filed on shelf.[name]

## 2015-02-17 ENCOUNTER — Inpatient Hospital Stay (HOSPITAL_COMMUNITY)
Admission: EM | Admit: 2015-02-17 | Discharge: 2015-02-22 | DRG: 189 | Disposition: A | Discharge status: Home / Self Care | Payer: Medicaid Other | Attending: Internal Medicine | Admitting: Internal Medicine

## 2015-02-17 ENCOUNTER — Emergency Department (HOSPITAL_COMMUNITY): Payer: Medicaid Other

## 2015-02-17 ENCOUNTER — Encounter (HOSPITAL_COMMUNITY): Payer: Self-pay

## 2015-02-17 DIAGNOSIS — F17210 Nicotine dependence, cigarettes, uncomplicated: Secondary | ICD-10-CM | POA: Diagnosis present

## 2015-02-17 DIAGNOSIS — R0602 Shortness of breath: Secondary | ICD-10-CM

## 2015-02-17 DIAGNOSIS — E662 Morbid (severe) obesity with alveolar hypoventilation: Secondary | ICD-10-CM | POA: Diagnosis present

## 2015-02-17 DIAGNOSIS — Z8701 Personal history of pneumonia (recurrent): Secondary | ICD-10-CM

## 2015-02-17 DIAGNOSIS — Z794 Long term (current) use of insulin: Secondary | ICD-10-CM

## 2015-02-17 DIAGNOSIS — J449 Chronic obstructive pulmonary disease, unspecified: Secondary | ICD-10-CM | POA: Diagnosis present

## 2015-02-17 DIAGNOSIS — F419 Anxiety disorder, unspecified: Secondary | ICD-10-CM | POA: Diagnosis present

## 2015-02-17 DIAGNOSIS — D573 Sickle-cell trait: Secondary | ICD-10-CM | POA: Diagnosis present

## 2015-02-17 DIAGNOSIS — I5032 Chronic diastolic (congestive) heart failure: Secondary | ICD-10-CM | POA: Diagnosis present

## 2015-02-17 DIAGNOSIS — Z951 Presence of aortocoronary bypass graft: Secondary | ICD-10-CM

## 2015-02-17 DIAGNOSIS — I10 Essential (primary) hypertension: Secondary | ICD-10-CM | POA: Diagnosis present

## 2015-02-17 DIAGNOSIS — J9601 Acute respiratory failure with hypoxia: Secondary | ICD-10-CM | POA: Diagnosis present

## 2015-02-17 DIAGNOSIS — M1990 Unspecified osteoarthritis, unspecified site: Secondary | ICD-10-CM | POA: Diagnosis present

## 2015-02-17 DIAGNOSIS — J9621 Acute and chronic respiratory failure with hypoxia: Principal | ICD-10-CM | POA: Diagnosis present

## 2015-02-17 DIAGNOSIS — K219 Gastro-esophageal reflux disease without esophagitis: Secondary | ICD-10-CM | POA: Diagnosis present

## 2015-02-17 DIAGNOSIS — M109 Gout, unspecified: Secondary | ICD-10-CM | POA: Diagnosis present

## 2015-02-17 DIAGNOSIS — E1165 Type 2 diabetes mellitus with hyperglycemia: Secondary | ICD-10-CM | POA: Diagnosis present

## 2015-02-17 DIAGNOSIS — J9620 Acute and chronic respiratory failure, unspecified whether with hypoxia or hypercapnia: Secondary | ICD-10-CM | POA: Diagnosis present

## 2015-02-17 DIAGNOSIS — T380X5A Adverse effect of glucocorticoids and synthetic analogues, initial encounter: Secondary | ICD-10-CM | POA: Diagnosis present

## 2015-02-17 DIAGNOSIS — Z9981 Dependence on supplemental oxygen: Secondary | ICD-10-CM

## 2015-02-17 DIAGNOSIS — E119 Type 2 diabetes mellitus without complications: Secondary | ICD-10-CM

## 2015-02-17 DIAGNOSIS — Z6841 Body Mass Index (BMI) 40.0 and over, adult: Secondary | ICD-10-CM

## 2015-02-17 DIAGNOSIS — J441 Chronic obstructive pulmonary disease with (acute) exacerbation: Secondary | ICD-10-CM | POA: Diagnosis present

## 2015-02-17 DIAGNOSIS — E1140 Type 2 diabetes mellitus with diabetic neuropathy, unspecified: Secondary | ICD-10-CM | POA: Diagnosis present

## 2015-02-17 DIAGNOSIS — G4733 Obstructive sleep apnea (adult) (pediatric): Secondary | ICD-10-CM | POA: Diagnosis present

## 2015-02-17 LAB — BASIC METABOLIC PANEL
Anion gap: 10 (ref 5–15)
BUN: 12 mg/dL (ref 6–23)
CO2: 27 mmol/L (ref 19–32)
Calcium: 9.1 mg/dL (ref 8.4–10.5)
Chloride: 100 mmol/L (ref 96–112)
Creatinine, Ser: 1.12 mg/dL — ABNORMAL HIGH (ref 0.50–1.10)
GFR calc Af Amer: 62 mL/min — ABNORMAL LOW (ref >90)
GFR calc non Af Amer: 54 mL/min — ABNORMAL LOW (ref >90)
Glucose, Bld: 102 mg/dL — ABNORMAL HIGH (ref 70–99)
Potassium: 3.7 mmol/L (ref 3.5–5.1)
Sodium: 137 mmol/L (ref 135–145)

## 2015-02-17 LAB — I-STAT TROPONIN, ED: Troponin i, poc: 0 ng/mL (ref 0.00–0.08)

## 2015-02-17 LAB — CBC
HCT: 34.4 % — ABNORMAL LOW (ref 36.0–46.0)
Hemoglobin: 11.8 g/dL — ABNORMAL LOW (ref 12.0–15.0)
MCH: 28.6 pg (ref 26.0–34.0)
MCHC: 34.3 g/dL (ref 30.0–36.0)
MCV: 83.5 fL (ref 78.0–100.0)
Platelets: 239 10*3/uL (ref 150–400)
RBC: 4.12 MIL/uL (ref 3.87–5.11)
RDW: 15 % (ref 11.5–15.5)
WBC: 6.1 10*3/uL (ref 4.0–10.5)

## 2015-02-17 LAB — CBG MONITORING, ED: Glucose-Capillary: 134 mg/dL — ABNORMAL HIGH (ref 70–99)

## 2015-02-17 LAB — GLUCOSE, CAPILLARY: Glucose-Capillary: 175 mg/dL — ABNORMAL HIGH (ref 70–99)

## 2015-02-17 MED ORDER — ALLOPURINOL 100 MG PO TABS
100.0000 mg | ORAL_TABLET | Freq: Every day | ORAL | Status: DC
  Administered 2015-02-18 – 2015-02-21 (×4): 100 mg via ORAL
  Filled 2015-02-17 (×5): qty 1

## 2015-02-17 MED ORDER — ALBUTEROL (5 MG/ML) CONTINUOUS INHALATION SOLN: 5.0000 mg/h | INHALATION_SOLUTION | Freq: Once | RESPIRATORY_TRACT | Status: DC

## 2015-02-17 MED ORDER — ACETAMINOPHEN 325 MG PO TABS
650.0000 mg | ORAL_TABLET | Freq: Three times a day (TID) | ORAL | Status: DC | PRN
  Administered 2015-02-21: 650 mg via ORAL
  Filled 2015-02-17: qty 2

## 2015-02-17 MED ORDER — HEPARIN SODIUM (PORCINE) 5000 UNIT/ML IJ SOLN
5000.0000 [IU] | Freq: Three times a day (TID) | INTRAMUSCULAR | Status: DC
Start: 2015-02-17 — End: 2015-02-22
  Administered 2015-02-18 – 2015-02-22 (×13): 5000 [IU] via SUBCUTANEOUS
  Filled 2015-02-17 (×17): qty 1

## 2015-02-17 MED ORDER — LORATADINE 10 MG PO TABS
10.0000 mg | ORAL_TABLET | Freq: Every day | ORAL | Status: DC
  Administered 2015-02-18 – 2015-02-21 (×4): 10 mg via ORAL
  Filled 2015-02-17 (×5): qty 1

## 2015-02-17 MED ORDER — DOXYCYCLINE HYCLATE 100 MG IV SOLR
100.0000 mg | Freq: Two times a day (BID) | INTRAVENOUS | Status: DC
  Administered 2015-02-17: 100 mg via INTRAVENOUS
  Filled 2015-02-17 (×2): qty 100

## 2015-02-17 MED ORDER — HYOSCYAMINE SULFATE 0.125 MG PO TBDP: 0.1250 mg | ORAL_TABLET | Freq: Four times a day (QID) | ORAL | Status: DC

## 2015-02-17 MED ORDER — IPRATROPIUM-ALBUTEROL 0.5-2.5 (3) MG/3ML IN SOLN
3.0000 mL | RESPIRATORY_TRACT | Status: DC
  Administered 2015-02-17 (×2): 3 mL via RESPIRATORY_TRACT
  Filled 2015-02-17: qty 3

## 2015-02-17 MED ORDER — BUDESONIDE-FORMOTEROL FUMARATE 160-4.5 MCG/ACT IN AERO
2.0000 | INHALATION_SPRAY | Freq: Two times a day (BID) | RESPIRATORY_TRACT | Status: DC
  Administered 2015-02-17 – 2015-02-22 (×10): 2 via RESPIRATORY_TRACT
  Filled 2015-02-17: qty 6

## 2015-02-17 MED ORDER — TRIAMTERENE-HCTZ 37.5-25 MG PO CAPS
1.0000 | ORAL_CAPSULE | Freq: Every day | ORAL | Status: DC
  Administered 2015-02-18: 1 via ORAL
  Filled 2015-02-17 (×2): qty 1

## 2015-02-17 MED ORDER — INSULIN ASPART 100 UNIT/ML ~~LOC~~ SOLN
0.0000 [IU] | SUBCUTANEOUS | Status: DC
  Administered 2015-02-17: 1 [IU] via SUBCUTANEOUS
  Administered 2015-02-18: 3 [IU] via SUBCUTANEOUS
  Administered 2015-02-18: 2 [IU] via SUBCUTANEOUS
  Administered 2015-02-18: 3 [IU] via SUBCUTANEOUS
  Administered 2015-02-18 – 2015-02-19 (×5): 2 [IU] via SUBCUTANEOUS
  Administered 2015-02-19 (×2): 3 [IU] via SUBCUTANEOUS
  Administered 2015-02-19: 2 [IU] via SUBCUTANEOUS
  Administered 2015-02-19: 3 [IU] via SUBCUTANEOUS
  Administered 2015-02-20 (×3): 2 [IU] via SUBCUTANEOUS
  Administered 2015-02-20: 5 [IU] via SUBCUTANEOUS
  Administered 2015-02-20: 2 [IU] via SUBCUTANEOUS
  Administered 2015-02-20: 3 [IU] via SUBCUTANEOUS
  Administered 2015-02-20: 1 [IU] via SUBCUTANEOUS
  Administered 2015-02-21 (×2): 2 [IU] via SUBCUTANEOUS
  Filled 2015-02-17: qty 1

## 2015-02-17 MED ORDER — PREDNISONE 20 MG PO TABS
60.0000 mg | ORAL_TABLET | Freq: Once | ORAL | Status: AC
  Administered 2015-02-17: 60 mg via ORAL
  Filled 2015-02-17: qty 3

## 2015-02-17 MED ORDER — IPRATROPIUM-ALBUTEROL 0.5-2.5 (3) MG/3ML IN SOLN
3.0000 mL | RESPIRATORY_TRACT | Status: DC | PRN
  Administered 2015-02-18 – 2015-02-22 (×9): 3 mL via RESPIRATORY_TRACT
  Filled 2015-02-17 (×11): qty 3

## 2015-02-17 MED ORDER — TIOTROPIUM BROMIDE MONOHYDRATE 18 MCG IN CAPS
18.0000 ug | ORAL_CAPSULE | Freq: Every day | RESPIRATORY_TRACT | Status: DC
  Administered 2015-02-18 – 2015-02-21 (×4): 18 ug via RESPIRATORY_TRACT
  Filled 2015-02-17: qty 5

## 2015-02-17 MED ORDER — HYOSCYAMINE SULFATE 0.125 MG SL SUBL
0.1250 mg | SUBLINGUAL_TABLET | Freq: Four times a day (QID) | SUBLINGUAL | Status: DC
  Administered 2015-02-18 – 2015-02-21 (×16): 0.125 mg via SUBLINGUAL
  Filled 2015-02-17 (×21): qty 1

## 2015-02-17 MED ORDER — GI COCKTAIL ~~LOC~~
30.0000 mL | Freq: Once | ORAL | Status: AC
  Administered 2015-02-17: 30 mL via ORAL
  Filled 2015-02-17: qty 30

## 2015-02-17 MED ORDER — LORAZEPAM 1 MG PO TABS
1.0000 mg | ORAL_TABLET | Freq: Once | ORAL | Status: AC
  Administered 2015-02-17: 1 mg via ORAL
  Filled 2015-02-17: qty 1

## 2015-02-17 MED ORDER — ZOLPIDEM TARTRATE 10 MG PO TABS: 10.0000 mg | ORAL_TABLET | Freq: Every day | ORAL | Status: DC

## 2015-02-17 MED ORDER — ALBUTEROL (5 MG/ML) CONTINUOUS INHALATION SOLN
10.0000 mg/h | INHALATION_SOLUTION | RESPIRATORY_TRACT | Status: DC
  Administered 2015-02-17: 10 mg/h via RESPIRATORY_TRACT
  Filled 2015-02-17: qty 20

## 2015-02-17 MED ORDER — ALBUTEROL SULFATE (2.5 MG/3ML) 0.083% IN NEBU
5.0000 mg | INHALATION_SOLUTION | Freq: Once | RESPIRATORY_TRACT | Status: AC
  Administered 2015-02-17: 5 mg via RESPIRATORY_TRACT

## 2015-02-17 MED ORDER — IPRATROPIUM-ALBUTEROL 0.5-2.5 (3) MG/3ML IN SOLN
3.0000 mL | Freq: Once | RESPIRATORY_TRACT | Status: AC
  Administered 2015-02-17: 3 mL via RESPIRATORY_TRACT
  Filled 2015-02-17: qty 3

## 2015-02-17 MED ORDER — SODIUM CHLORIDE 0.9 % IV SOLN
INTRAVENOUS | Status: DC
  Administered 2015-02-17: 21:00:00 via INTRAVENOUS
  Administered 2015-02-19: 1000 mL via INTRAVENOUS
  Administered 2015-02-20 – 2015-02-21 (×2): via INTRAVENOUS

## 2015-02-17 MED ORDER — PREGABALIN 75 MG PO CAPS
75.0000 mg | ORAL_CAPSULE | Freq: Two times a day (BID) | ORAL | Status: DC
  Administered 2015-02-18 – 2015-02-21 (×9): 75 mg via ORAL
  Filled 2015-02-17 (×10): qty 1

## 2015-02-17 MED ORDER — ALBUTEROL (5 MG/ML) CONTINUOUS INHALATION SOLN
10.0000 mg/h | INHALATION_SOLUTION | Freq: Once | RESPIRATORY_TRACT | Status: DC
  Filled 2015-02-17: qty 20

## 2015-02-17 MED ORDER — OXYCODONE-ACETAMINOPHEN 5-325 MG PO TABS
1.0000 | ORAL_TABLET | Freq: Once | ORAL | Status: AC
  Administered 2015-02-17: 1 via ORAL
  Filled 2015-02-17: qty 1

## 2015-02-17 MED ORDER — PANTOPRAZOLE SODIUM 40 MG PO TBEC
40.0000 mg | DELAYED_RELEASE_TABLET | Freq: Every day | ORAL | Status: DC
  Administered 2015-02-17 – 2015-02-21 (×5): 40 mg via ORAL
  Filled 2015-02-17 (×6): qty 1

## 2015-02-17 MED ORDER — ZOLPIDEM TARTRATE 5 MG PO TABS
5.0000 mg | ORAL_TABLET | Freq: Every day | ORAL | Status: DC
  Administered 2015-02-18 – 2015-02-21 (×5): 5 mg via ORAL
  Filled 2015-02-17 (×5): qty 1

## 2015-02-17 MED ORDER — METHYLPREDNISOLONE SODIUM SUCC 125 MG IJ SOLR
125.0000 mg | INTRAMUSCULAR | Status: DC
  Administered 2015-02-17: 125 mg via INTRAVENOUS
  Filled 2015-02-17 (×2): qty 2

## 2015-02-17 NOTE — ED Notes (Signed)
Below orders not completed by EW. 

## 2015-02-17 NOTE — ED Notes (Signed)
Per respiratory, pt lungs very tight.  Recommended continuous neb.  Able to get verbal from PA,.

## 2015-02-17 NOTE — ED Notes (Signed)
Respiratory called

## 2015-02-17 NOTE — ED Notes (Signed)
MD at bedside. 

## 2015-02-17 NOTE — ED Notes (Signed)
Pt with hx of COPD.  Pt has oxygen.  Pt sob with ambulation.  C/O shortness of breath since last week.  Chest pain.  Productive cough.  Unknown for fever.  Sore throat

## 2015-02-17 NOTE — H&P (Addendum)
Hospitalist Admission History and Physical  Patient name: Stephanie Schultz Medical record number: [id number] Date of birth: 1956-02-21 Age: 59 y.o. Gender: female  Primary Care Provider: Barbette Merino, MD  Chief Complaint: acute on chronic resp failure w/ hypoxia   History of Present Illness:This is a 59 y.o. year old female with significant past medical history of morbid obesity, OSA, chronic resp failure on 2L [place] chronically, type 2 DM, COPD, tobacco abuse presenting with acute on chronic resp failure w/ hypoxia. Pt reports choking episode 4-5 days ago while trying to drink some water. Since this point, pt reports mild cough and SOB. Has not noticed any significant wheezing apart from today. Reports increased albuterol use. No fevers or chills. No nausea or vomiting. Has been compliant w/ CPAP.  Presented to ER afebrile, hemodynamically stable. Satting in mid 90s on 2L [place]. Pt desatted to upper 80s w/ ambulation per report. CBC and BMET grossly WNL. CXR negative for PNA. Given oral prednisone and CAT w/ still mild increased WOB. Pt feels uncomfortable going home per report.   Assessment and Plan: Stephanie Schultz is a 59 y.o. year old female presenting with Acute on chronic resp failure w/ hypoxia   Active Problems:   Acute respiratory failure with hypoxia   1- Acute on chronic resp failure w/ hypoxia -likely secondary to mild COPD flare from subacute aspiration event  -no infiltrate on imaging, afebrile, no leukocytosis -IV solumedrol  -duonebs -IV doxycycline -cont CPAP  -cont supplemental O2- on 2L [place] chronically  -follow  -appears w/ minimal resp distress at present. Able to speak in full sentences -discussed smoking cessation at length.    2-Chronic resp failure -on 2L [place] chronically in setting of COPD w/ body habitus/OSA/OHS likely confounders -treatment as above -no resp distress -follow -ABG as clinically indicated  3- Type 2 DM -SSI -A1C -hold orals -anticipate higher  sugars given steroid use -follow  4-HTN -BP stable  -cont home regimen   5-OSA -cont CPAP  -FEN/GI: heart healthy/carb modified diet  Prophylaxis: sub q heparin  Disposition: pending further evaluation Code Status: Full Code    Patient Active Problem List   Diagnosis Date Noted  . Acute respiratory failure with hypoxia 02/17/2015  . Arthritis 12/13/2014  . Gout 03/03/2014  . Diabetic neuropathy 03/03/2014  . Carpal tunnel syndrome 03/03/2014  . DM type 2 (diabetes mellitus, type 2) 05/22/2013  . Anxiety 05/22/2013  . Transaminasemia 05/09/2013  . Leukocytosis 04/27/2013  . Acute bronchitis 04/13/2013  . Tobacco abuse   . Chest pain   . Sickle cell trait   . Morbid obesity   . Acute and chronic respiratory failure 01/16/2013  . ? Chronic diastolic heart failure/ LVH 01/16/2013  . Hoarseness 12/22/2012  . Epistaxis, recurrent 07/08/2012  . GERD (gastroesophageal reflux disease) 09/20/2011  . Pulmonary nodule 09/20/2011  . COPD (chronic obstructive pulmonary disease) 04/04/2011  . Diabetes mellitus type 2, controlled 04/04/2011  . Hypertension 04/04/2011  . Obstructive sleep apnea 04/04/2011  . Chronic allergic rhinitis 04/04/2011   Past Medical History: Past Medical History  Diagnosis Date  . Hypertension   . Anxiety   . Emphysema   . COPD (chronic obstructive pulmonary disease)   . OSA on CPAP     CPAP at night   . Dizziness     pt believes this is motion sickness or vertigo  . GERD (gastroesophageal reflux disease)   . Headache(784.0)   . Cancer 1990    cervical   . Anemia   .  Morbid obesity   . Sickle cell trait   . Hx of cardiac catheterization     a. LHC at [facility] in [place], [place] 09/2008:  Normal coronary arteries EF 70%.  . Tobacco abuse     a. up to 3ppd from age 89 to 54, now 1/4 ppd (01/2013)  . Shortness of breath   . Pneumonia   . Diabetes mellitus without complication     Past Surgical History: Past Surgical History  Procedure Laterality  Date  . Tubal ligation    . Cardiac catheterization    . Colonoscopy  09/05/2012    Procedure: COLONOSCOPY;  Surgeon: Beryle Beams, MD;  Location: [facility];  Service: Endoscopy;  Laterality: N/A;    Social History: History   Social History  . Marital Status: Single    Spouse Name: N/A  . Number of Children: 3  . Years of Education: 11th   Occupational History  . Disabled     Emphysema   Social History Main Topics  . Smoking status: Current Every Day Smoker -- 0.50 packs/day for 36 years    Types: Cigarettes  . Smokeless tobacco: Never Used     Comment: Approx 90 pk-yrs (up to 3ppd until ~ 2009). Smoking 3 cigs per day now.  . Alcohol Use: No  . Drug Use: No  . Sexual Activity: Yes   Other Topics Concern  . None   Social History Narrative   From [place], [place].  Moved to [place] about 8 years ago to be closer to her daughter. She moved back to [place] about a year and a half ago. She lives by herself. She does not routinely exercise or adhere to any particular diet.   Caffeine Use: 1-2 cups daily    Family History: Family History  Problem Relation Age of Onset  . Lung cancer Paternal Aunt   . Lung cancer Paternal Grandfather   . Other Father     unaware of father's medical history  . Diabetes Mother     alive @ 49  . Myasthenia gravis Mother   . Other      multiple siblings a&w.    Allergies: No Known Allergies  Current Facility-Administered Medications  Medication Dose Route Frequency Provider Last Rate Last Dose  . 0.9 %  sodium chloride infusion   Intravenous Continuous Deneise Lever, MD      . acetaminophen (TYLENOL) tablet 650 mg  650 mg Oral Q8H PRN Deneise Lever, MD      . albuterol (PROVENTIL,VENTOLIN) solution continuous neb  10 mg/hr Nebulization Continuous Virgel Manifold, MD   Stopped at 02/17/15 1817  . [START ON 02/18/2015] allopurinol (ZYLOPRIM) tablet 100 mg  100 mg Oral Daily Deneise Lever, MD      . budesonide-formoterol  [facility]) 160-4.5 MCG/ACT inhaler 2 puff  2 puff Inhalation BID Deneise Lever, MD      . heparin injection 5,000 Units  5,000 Units Subcutaneous 3 times per day Deneise Lever, MD      . hyoscyamine [facility] disintergrating tablet 0.125 mg  0.125 mg Sublingual QID Deneise Lever, MD      . insulin aspart (novoLOG) injection 0-9 Units  0-9 Units Subcutaneous 6 times per day Deneise Lever, MD      . ipratropium-albuterol (DUONEB) 0.5-2.5 (3) MG/3ML nebulizer solution 3 mL  3 mL Nebulization Q4H Deneise Lever, MD      . ipratropium-albuterol (DUONEB) 0.5-2.5 (3) MG/3ML nebulizer solution 3 mL  3 mL Nebulization Q2H PRN Deneise Lever, MD      . Derrill Memo ON 02/18/2015] loratadine (CLARITIN) tablet 10 mg  10 mg Oral Daily Deneise Lever, MD      . methylPREDNISolone sodium succinate (SOLU-MEDROL) 125 mg/2 mL injection 125 mg  125 mg Intravenous Q24H Deneise Lever, MD      . pantoprazole (PROTONIX) EC tablet 40 mg  40 mg Oral Daily Deneise Lever, MD      . pregabalin (LYRICA) capsule 75 mg  75 mg Oral BID Deneise Lever, MD      . Derrill Memo ON 02/18/2015] tiotropium (SPIRIVA) inhalation capsule 18 mcg  18 mcg Inhalation Daily Deneise Lever, MD      . Derrill Memo ON 02/18/2015] triamterene-hydrochlorothiazide (DYAZIDE) 37.5-25 MG per capsule 1 capsule  1 capsule Oral Daily Deneise Lever, MD      . zolpidem [facility]) tablet 10 mg  10 mg Oral QHS Deneise Lever, MD       Current Outpatient Prescriptions  Medication Sig Dispense Refill  . acetaminophen (TYLENOL) 650 MG CR tablet Take 650 mg by mouth every 8 (eight) hours as needed for pain.    Marland Kitchen albuterol (PROVENTIL HFA;VENTOLIN HFA) 108 (90 BASE) MCG/ACT inhaler Inhale 2 puffs into the lungs every 4 (four) hours as needed for wheezing. 1 Inhaler 3  . albuterol (PROVENTIL) (2.5 MG/3ML) 0.083% nebulizer solution Take 3 mLs (2.5 mg total) by nebulization 4 (four) times daily. DX J44.9 360 mL 2  . allopurinol (ZYLOPRIM) 100 MG tablet Take 100 mg by mouth  daily.    . budesonide-formoterol (SYMBICORT) 160-4.5 MCG/ACT inhaler Inhale 2 puffs into the lungs 2 (two) times daily.    . fluticasone (FLONASE) 50 MCG/ACT nasal spray Place 2 sprays into both nostrils 2 (two) times daily. 16 g 6  . glipiZIDE (GLUCOTROL) 10 MG tablet Take 10 mg by mouth daily before breakfast.    . HYDROcodone-acetaminophen (NORCO) 7.5-325 MG per tablet Take 1 tablet by mouth 3 (three) times daily as needed for moderate pain or severe pain.   0  . hyoscyamine (ANASPAZ) 0.125 MG TBDP disintergrating tablet Place 0.125 mg under the tongue 4 (four) times daily.    . indomethacin (INDOCIN) 25 MG capsule Take 25 mg by mouth 2 (two) times daily with a meal.    . ipratropium (ATROVENT) 0.02 % nebulizer solution inhale contents of 1 vial in nebulizer four times a day 360 mL 1  . loratadine (CLARITIN) 10 MG tablet Take 10 mg by mouth daily.    . pantoprazole (PROTONIX) 40 MG tablet Take 40 mg by mouth daily.  0  . pregabalin (LYRICA) 75 MG capsule Take 75 mg by mouth 2 (two) times daily.    Marland Kitchen tiotropium (SPIRIVA) 18 MCG inhalation capsule Place 18 mcg into inhaler and inhale daily.    Marland Kitchen triamterene-hydrochlorothiazide (DYAZIDE) 37.5-25 MG per capsule Take 1 capsule by mouth daily.    Marland Kitchen zolpidem (AMBIEN) 10 MG tablet Take 10 mg by mouth at bedtime.     Review Of Systems: 12 point ROS negative except as noted above in HPI.  Physical Exam: Filed Vitals:   02/17/15 1830  BP: 123/64  Pulse: 97  Temp:   Resp: 20    General: alert, cooperative and morbidly obese HEENT: PERRLA and extra ocular movement intact Heart: S1, S2 normal, no murmur, rub or gallop, regular rate and rhythm Lungs: expiratory wheezes and minimally labored breathing  Abdomen:obese abdomen, non tender, +  bowel sounds Extremities: extremities normal, atraumatic, no cyanosis or edema Skin:no rashes Neurology: normal without focal findings  Labs and Imaging: Lab Results  Component Value Date/Time   NA 137  02/17/2015 01:00 PM   K 3.7 02/17/2015 01:00 PM   CL 100 02/17/2015 01:00 PM   CO2 27 02/17/2015 01:00 PM   BUN 12 02/17/2015 01:00 PM   CREATININE 1.12* 02/17/2015 01:00 PM   GLUCOSE 102* 02/17/2015 01:00 PM   Lab Results  Component Value Date   WBC 6.1 02/17/2015   HGB 11.8* 02/17/2015   HCT 34.4* 02/17/2015   MCV 83.5 02/17/2015   PLT 239 02/17/2015    Dg Chest 2 View (if Patient Has Fever And/or Copd)  02/17/2015   CLINICAL DATA:  Neck swelling that hinders breathing, severe shortness of breath, chest pain, history COPD, GERD, sickle trait, smoker, diabetes  EXAM: CHEST  2 VIEW  COMPARISON:  01/10/2015  FINDINGS: Normal heart size, mediastinal contours and pulmonary vascularity.  Atherosclerotic calcification aorta.  Lungs minimally hyperinflated clear.  No infiltrate, pleural effusion or pneumothorax.  Scattered endplate spur formation thoracic spine with diffuse idiopathic skeletal hyperostosis.  IMPRESSION: Hyperinflation question COPD.  No acute infiltrate.   Electronically Signed   By: Lavonia Dana M.D.   On: 02/17/2015 14:54           Shanda Howells MD  Pager: [phone]

## 2015-02-17 NOTE — ED Notes (Signed)
Dr. Newton at bedside. 

## 2015-02-17 NOTE — Progress Notes (Signed)
Pt refused CPAP for tonight and prefers to bring her home machine and nasal pillows tomorrow. Pt remains on 2Lpm SpO2 93%. RN notified.

## 2015-02-18 DIAGNOSIS — F17210 Nicotine dependence, cigarettes, uncomplicated: Secondary | ICD-10-CM | POA: Diagnosis present

## 2015-02-18 DIAGNOSIS — G4733 Obstructive sleep apnea (adult) (pediatric): Secondary | ICD-10-CM | POA: Diagnosis present

## 2015-02-18 DIAGNOSIS — K219 Gastro-esophageal reflux disease without esophagitis: Secondary | ICD-10-CM | POA: Diagnosis present

## 2015-02-18 DIAGNOSIS — I10 Essential (primary) hypertension: Secondary | ICD-10-CM | POA: Diagnosis present

## 2015-02-18 DIAGNOSIS — T380X5A Adverse effect of glucocorticoids and synthetic analogues, initial encounter: Secondary | ICD-10-CM | POA: Diagnosis present

## 2015-02-18 DIAGNOSIS — M109 Gout, unspecified: Secondary | ICD-10-CM | POA: Diagnosis present

## 2015-02-18 DIAGNOSIS — E662 Morbid (severe) obesity with alveolar hypoventilation: Secondary | ICD-10-CM | POA: Diagnosis present

## 2015-02-18 DIAGNOSIS — Z9981 Dependence on supplemental oxygen: Secondary | ICD-10-CM | POA: Diagnosis not present

## 2015-02-18 DIAGNOSIS — E1165 Type 2 diabetes mellitus with hyperglycemia: Secondary | ICD-10-CM | POA: Diagnosis present

## 2015-02-18 DIAGNOSIS — J438 Other emphysema: Secondary | ICD-10-CM | POA: Diagnosis not present

## 2015-02-18 DIAGNOSIS — F419 Anxiety disorder, unspecified: Secondary | ICD-10-CM | POA: Diagnosis present

## 2015-02-18 DIAGNOSIS — J9621 Acute and chronic respiratory failure with hypoxia: Secondary | ICD-10-CM | POA: Diagnosis present

## 2015-02-18 DIAGNOSIS — D573 Sickle-cell trait: Secondary | ICD-10-CM | POA: Diagnosis present

## 2015-02-18 DIAGNOSIS — I5032 Chronic diastolic (congestive) heart failure: Secondary | ICD-10-CM | POA: Diagnosis present

## 2015-02-18 DIAGNOSIS — Z794 Long term (current) use of insulin: Secondary | ICD-10-CM | POA: Diagnosis not present

## 2015-02-18 DIAGNOSIS — J441 Chronic obstructive pulmonary disease with (acute) exacerbation: Secondary | ICD-10-CM | POA: Diagnosis present

## 2015-02-18 DIAGNOSIS — M1990 Unspecified osteoarthritis, unspecified site: Secondary | ICD-10-CM | POA: Diagnosis present

## 2015-02-18 DIAGNOSIS — J9601 Acute respiratory failure with hypoxia: Secondary | ICD-10-CM | POA: Diagnosis not present

## 2015-02-18 DIAGNOSIS — Z951 Presence of aortocoronary bypass graft: Secondary | ICD-10-CM | POA: Diagnosis not present

## 2015-02-18 DIAGNOSIS — Z6841 Body Mass Index (BMI) 40.0 and over, adult: Secondary | ICD-10-CM | POA: Diagnosis not present

## 2015-02-18 DIAGNOSIS — E1140 Type 2 diabetes mellitus with diabetic neuropathy, unspecified: Secondary | ICD-10-CM | POA: Diagnosis present

## 2015-02-18 DIAGNOSIS — E119 Type 2 diabetes mellitus without complications: Secondary | ICD-10-CM | POA: Diagnosis not present

## 2015-02-18 DIAGNOSIS — Z8701 Personal history of pneumonia (recurrent): Secondary | ICD-10-CM | POA: Diagnosis not present

## 2015-02-18 LAB — COMPREHENSIVE METABOLIC PANEL
ALK PHOS: 36 U/L — AB (ref 39–117)
ALT: 35 U/L (ref 0–35)
AST: 32 U/L (ref 0–37)
Albumin: 4.1 g/dL (ref 3.5–5.2)
Anion gap: 12 (ref 5–15)
BUN: 17 mg/dL (ref 6–23)
CO2: 25 mmol/L (ref 19–32)
CREATININE: 1.22 mg/dL — AB (ref 0.50–1.10)
Calcium: 9.2 mg/dL (ref 8.4–10.5)
Chloride: 101 mmol/L (ref 96–112)
GFR calc Af Amer: 56 mL/min — ABNORMAL LOW (ref >90)
GFR calc non Af Amer: 49 mL/min — ABNORMAL LOW (ref >90)
Glucose, Bld: 215 mg/dL — ABNORMAL HIGH (ref 70–99)
POTASSIUM: 4.2 mmol/L (ref 3.5–5.1)
Sodium: 138 mmol/L (ref 135–145)
Total Bilirubin: 0.4 mg/dL (ref 0.3–1.2)
Total Protein: 7.8 g/dL (ref 6.0–8.3)

## 2015-02-18 LAB — CBC WITH DIFFERENTIAL/PLATELET
BASOS PCT: 0 % (ref 0–1)
Basophils Absolute: 0 10*3/uL (ref 0.0–0.1)
EOS ABS: 0 10*3/uL (ref 0.0–0.7)
Eosinophils Relative: 0 % (ref 0–5)
HEMATOCRIT: 34.2 % — AB (ref 36.0–46.0)
Hemoglobin: 11.4 g/dL — ABNORMAL LOW (ref 12.0–15.0)
LYMPHS ABS: 0.7 10*3/uL (ref 0.7–4.0)
LYMPHS PCT: 10 % — AB (ref 12–46)
MCH: 27.9 pg (ref 26.0–34.0)
MCHC: 33.3 g/dL (ref 30.0–36.0)
MCV: 83.8 fL (ref 78.0–100.0)
MONOS PCT: 0 % — AB (ref 3–12)
Monocytes Absolute: 0 10*3/uL — ABNORMAL LOW (ref 0.1–1.0)
NEUTROS ABS: 5.8 10*3/uL (ref 1.7–7.7)
Neutrophils Relative %: 90 % — ABNORMAL HIGH (ref 43–77)
PLATELETS: 227 10*3/uL (ref 150–400)
RBC: 4.08 MIL/uL (ref 3.87–5.11)
RDW: 14.9 % (ref 11.5–15.5)
WBC: 6.5 10*3/uL (ref 4.0–10.5)

## 2015-02-18 LAB — GLUCOSE, CAPILLARY
GLUCOSE-CAPILLARY: 195 mg/dL — AB (ref 70–99)
GLUCOSE-CAPILLARY: 217 mg/dL — AB (ref 70–99)
Glucose-Capillary: 226 mg/dL — ABNORMAL HIGH (ref 70–99)
Glucose-Capillary: 193 mg/dL — ABNORMAL HIGH (ref 70–99)
Glucose-Capillary: 187 mg/dL — ABNORMAL HIGH (ref 70–99)

## 2015-02-18 LAB — TROPONIN I: Troponin I: <0.03 ng/mL (ref <0.031)

## 2015-02-18 MED ORDER — METHYLPREDNISOLONE SODIUM SUCC 125 MG IJ SOLR
60.0000 mg | Freq: Four times a day (QID) | INTRAMUSCULAR | Status: DC
Start: 2015-02-18 — End: 2015-02-21
  Administered 2015-02-18 – 2015-02-21 (×12): 60 mg via INTRAVENOUS
  Filled 2015-02-18 (×19): qty 0.96

## 2015-02-18 MED ORDER — GI COCKTAIL ~~LOC~~
30.0000 mL | Freq: Once | ORAL | Status: AC
  Administered 2015-02-18: 30 mL via ORAL
  Filled 2015-02-18: qty 30

## 2015-02-18 MED ORDER — LEVOFLOXACIN 500 MG PO TABS
500.0000 mg | ORAL_TABLET | Freq: Every day | ORAL | Status: DC
  Administered 2015-02-18 – 2015-02-21 (×4): 500 mg via ORAL
  Filled 2015-02-18 (×5): qty 1

## 2015-02-18 MED ORDER — POLYETHYLENE GLYCOL 3350 17 G PO PACK
17.0000 g | PACK | Freq: Every day | ORAL | Status: DC
  Administered 2015-02-19 – 2015-02-21 (×4): 17 g via ORAL
  Filled 2015-02-18 (×5): qty 1

## 2015-02-18 MED ORDER — LORAZEPAM 0.5 MG PO TABS
0.5000 mg | ORAL_TABLET | Freq: Once | ORAL | Status: AC
  Administered 2015-02-18: 0.5 mg via ORAL
  Filled 2015-02-18: qty 1

## 2015-02-18 MED ORDER — POLYETHYLENE GLYCOL 3350 17 G PO PACK: 17.0000 g | PACK | Freq: Every day | ORAL | Status: DC

## 2015-02-18 MED ORDER — RACEPINEPHRINE HCL 2.25 % IN NEBU
0.5000 mL | INHALATION_SOLUTION | Freq: Once | RESPIRATORY_TRACT | Status: AC
  Administered 2015-02-18: 0.5 mL via RESPIRATORY_TRACT
  Filled 2015-02-18: qty 0.5

## 2015-02-18 MED ORDER — IPRATROPIUM-ALBUTEROL 0.5-2.5 (3) MG/3ML IN SOLN
3.0000 mL | Freq: Four times a day (QID) | RESPIRATORY_TRACT | Status: DC
  Administered 2015-02-18 – 2015-02-20 (×11): 3 mL via RESPIRATORY_TRACT
  Filled 2015-02-18 (×11): qty 3

## 2015-02-18 NOTE — Progress Notes (Signed)
UR completed 

## 2015-02-18 NOTE — Progress Notes (Signed)
[facility] PROGRESS NOTE  Stephanie Schultz NGE:[id number] DOB: May 21, 1956 DOA: 02/17/2015  PCP: Barbette Merino, MD  Brief HPI: 59 year old African-American female with morbid obesity, sleep apnea, chronic respiratory failure on home oxygen, COPD, presented with acutely worsening shortness of breath with hypoxia. There was some concern for aspiration. Chest x-ray did not show any infiltrate. She was admitted for further management.  Past medical history:  Past Medical History  Diagnosis Date  . Hypertension   . Anxiety   . Emphysema   . COPD (chronic obstructive pulmonary disease)   . OSA on CPAP     CPAP at night   . Dizziness     pt believes this is motion sickness or vertigo  . GERD (gastroesophageal reflux disease)   . Headache(784.0)   . Cancer 1990    cervical   . Anemia   . Morbid obesity   . Sickle cell trait   . Hx of cardiac catheterization     a. LHC at [facility] in [place], [place] 09/2008:  Normal coronary arteries EF 70%.  . Tobacco abuse     a. up to 3ppd from age 89 to 81, now 1/4 ppd (01/2013)  . Shortness of breath   . Pneumonia   . Diabetes mellitus without complication     Consultants: None  Procedures: None  Antibiotics: Initially doxycycline was given. Changed to Levaquin.  Subjective: Patient feels slightly better this morning. She mentions to me that she had some neck swelling, but no tongue swelling. Neck appears to be okay now. Had some burning sensation in her chest. Early this morning which was relieved after she was given GI cocktail.  Objective: Vital Signs  Filed Vitals:   02/18/15 0446 02/18/15 0519 02/18/15 0545 02/18/15 0737  BP:   158/64   Pulse:   116   Temp:      TempSrc:      Resp:   24   Height:      Weight:      SpO2: 89% 93% 94% 88%    Intake/Output Summary (Last 24 hours) at 02/18/15 1105 Last data filed at 02/18/15 0700  Gross per 24 hour  Intake      0 ml  Output    300 ml  Net   -300 ml   Filed Weights   02/17/15  2224  Weight: 95.255 kg (210 lb)    General appearance: alert, cooperative, appears stated age, no distress and morbidly obese Head: Normocephalic, without obvious abnormality, atraumatic Neck: no adenopathy, no carotid bruit, no JVD, supple, symmetrical, trachea midline and thyroid not enlarged, symmetric, no tenderness/mass/nodules Resp: Tight air entry bilaterally with wheezing. No definite crackles. Cardio: regular rate and rhythm, S1, S2 normal, no murmur, click, rub or gallop GI: soft, non-tender; bowel sounds normal; no masses,  no organomegaly Extremities: extremities normal, atraumatic, no cyanosis or edema Neurologic: Alert and oriented 3. No focal neurological deficits are noted.  Lab Results:  Basic Metabolic Panel:  Recent Labs Lab 02/17/15 1300 02/18/15 0408  NA 137 138  K 3.7 4.2  CL 100 101  CO2 27 25  GLUCOSE 102* 215*  BUN 12 17  CREATININE 1.12* 1.22*  CALCIUM 9.1 9.2   Liver Function Tests:  Recent Labs Lab 02/18/15 0408  AST 32  ALT 35  ALKPHOS 36*  BILITOT 0.4  PROT 7.8  ALBUMIN 4.1   CBC:  Recent Labs Lab 02/17/15 1300 02/18/15 0408  WBC 6.1 6.5  NEUTROABS  --  5.8  HGB  11.8* 11.4*  HCT 34.4* 34.2*  MCV 83.5 83.8  PLT 239 227    CBG:  Recent Labs Lab 02/17/15 2054 02/17/15 2339 02/18/15 0429 02/18/15 0742  GLUCAP 134* 175* 226* 193*     Studies/Results: Dg Chest 2 View (if Patient Has Fever And/or Copd)  02/17/2015   CLINICAL DATA:  Neck swelling that hinders breathing, severe shortness of breath, chest pain, history COPD, GERD, sickle trait, smoker, diabetes  EXAM: CHEST  2 VIEW  COMPARISON:  01/10/2015  FINDINGS: Normal heart size, mediastinal contours and pulmonary vascularity.  Atherosclerotic calcification aorta.  Lungs minimally hyperinflated clear.  No infiltrate, pleural effusion or pneumothorax.  Scattered endplate spur formation thoracic spine with diffuse idiopathic skeletal hyperostosis.  IMPRESSION:  Hyperinflation question COPD.  No acute infiltrate.   Electronically Signed   By: Lavonia Dana M.D.   On: 02/17/2015 14:54    Medications:  Scheduled: . allopurinol  100 mg Oral Daily  . budesonide-formoterol  2 puff Inhalation BID  . heparin  5,000 Units Subcutaneous 3 times per day  . hyoscyamine  0.125 mg Sublingual QID  . insulin aspart  0-9 Units Subcutaneous 6 times per day  . ipratropium-albuterol  3 mL Nebulization QID  . levofloxacin  500 mg Oral Daily  . loratadine  10 mg Oral Daily  . methylPREDNISolone (SOLU-MEDROL) injection  60 mg Intravenous Q6H  . pantoprazole  40 mg Oral Daily  . pregabalin  75 mg Oral BID  . tiotropium  18 mcg Inhalation Daily  . triamterene-hydrochlorothiazide  1 capsule Oral Daily  . zolpidem  5 mg Oral QHS   Continuous: . sodium chloride 50 mL/hr at 02/17/15 2104   [id number], ipratropium-albuterol  Assessment/Plan:  Active Problems:   COPD (chronic obstructive pulmonary disease)   Diabetes mellitus type 2, controlled   Hypertension   Obstructive sleep apnea   Acute on chronic respiratory failure   Acute respiratory failure with hypoxia     Acute on chronic resp failure w/ hypoxia Likely secondary to COPD flare from subacute aspiration event. Seems to be better this morning's. The lungs still very tight on auscultation. Continue nebulizer treatments. Increase frequency of intravenous steroids. Change antibiotics to Levaquin. Continue oxygen. Smoking cessation has been discussed. No clear abnormality noted in the neck area. She is on chronic home oxygen therapy.  Acute COPD exacerbation As above. Continue antibiotics and steroids. Along with nebulizer treatments.  Type 2 DM High blood sugars secondary to steroids. May need to utilize long-acting insulin. Continue SSI for now. Her oral agents are being held.  Essential HTN BP stable. Continue current regimen.  Elevated creatinine Previous creatinines have been normal. She  could be mildly dehydrated. Continue IV fluids. Monitor closely.  OSA Continue CPAP  DVT Prophylaxis: Heparin    Code Status: Full code  Family Communication: Discussed with the patient  Disposition Plan: Not ready for discharge. She comes from home and will likely return when improved.      [facility] Pager [phone] 02/18/2015, 11:05 AM  If 7PM-7AM, please contact night-coverage at [email or web address], password [facility]

## 2015-02-18 NOTE — Progress Notes (Signed)
Pt c/o burning in upper chest and midsternum. On call NP noitified.

## 2015-02-18 NOTE — Progress Notes (Signed)
Nutrition Brief Note  Patient identified on the Malnutrition Screening Tool (MST) Report  Pt reports attempting weight loss PTA. Pt reports feeling very hungry d/t the steroids she is on. UBW is 220 lb. Pt states she has had weight loss d/t cutting down on her dessert intake.   Wt Readings from Last 15 Encounters:  02/17/15 210 lb (95.255 kg)  01/10/15 213 lb (96.616 kg)  12/21/14 215 lb (97.523 kg)  12/13/14 216 lb 6.4 oz (98.158 kg)  08/03/14 216 lb 9.6 oz (98.249 kg)  07/06/14 212 lb 12.8 oz (96.525 kg)  06/02/14 214 lb (97.07 kg)  05/20/14 210 lb 12.8 oz (95.618 kg)  04/29/14 211 lb (95.709 kg)  03/03/14 206 lb 8 oz (93.668 kg)  12/08/13 215 lb (97.523 kg)  07/16/13 216 lb (97.977 kg)  06/12/13 225 lb 12.8 oz (102.422 kg)  05/22/13 219 lb 2.2 oz (99.4 kg)  05/17/13 228 lb (103.42 kg)    Body mass index is 41.01 kg/(m^2). Patient meets criteria for morbid obesity based on current BMI.   Current diet order is CHO modified, heart healthy.Labs and medications reviewed.   No nutrition interventions warranted at this time. If nutrition issues arise, please consult RD.   Clayton Bibles, MS, RD, LDN Pager: [phone] After Hours Pager: [phone]

## 2015-02-18 NOTE — Progress Notes (Signed)
Pt refused CPAP for tonight stated her daughter forgot to bring her machine from home. She does not like our CPAPs because they do not have the nasal pillows. Pt on 2 LPM and vitals stable. RT will continue to monitor.

## 2015-02-19 LAB — BASIC METABOLIC PANEL
Anion gap: 10 (ref 5–15)
BUN: 25 mg/dL — AB (ref 6–23)
CHLORIDE: 106 mmol/L (ref 96–112)
CO2: 23 mmol/L (ref 19–32)
CREATININE: 1.09 mg/dL (ref 0.50–1.10)
Calcium: 9.2 mg/dL (ref 8.4–10.5)
GFR calc non Af Amer: 56 mL/min — ABNORMAL LOW (ref >90)
GFR, EST AFRICAN AMERICAN: 65 mL/min — AB (ref >90)
GLUCOSE: 255 mg/dL — AB (ref 70–99)
Potassium: 4.4 mmol/L (ref 3.5–5.1)
SODIUM: 139 mmol/L (ref 135–145)

## 2015-02-19 LAB — GLUCOSE, CAPILLARY
GLUCOSE-CAPILLARY: 214 mg/dL — AB (ref 70–99)
GLUCOSE-CAPILLARY: 152 mg/dL — AB (ref 70–99)
GLUCOSE-CAPILLARY: 201 mg/dL — AB (ref 70–99)
GLUCOSE-CAPILLARY: 192 mg/dL — AB (ref 70–99)
Glucose-Capillary: 182 mg/dL — ABNORMAL HIGH (ref 70–99)
Glucose-Capillary: 202 mg/dL — ABNORMAL HIGH (ref 70–99)

## 2015-02-19 LAB — CBC
HCT: 32.6 % — ABNORMAL LOW (ref 36.0–46.0)
Hemoglobin: 10.8 g/dL — ABNORMAL LOW (ref 12.0–15.0)
MCH: 27.9 pg (ref 26.0–34.0)
MCHC: 33.1 g/dL (ref 30.0–36.0)
MCV: 84.2 fL (ref 78.0–100.0)
Platelets: 223 10*3/uL (ref 150–400)
RBC: 3.87 MIL/uL (ref 3.87–5.11)
RDW: 15.1 % (ref 11.5–15.5)
WBC: 12.9 10*3/uL — ABNORMAL HIGH (ref 4.0–10.5)

## 2015-02-19 LAB — HEMOGLOBIN A1C
Hgb A1c MFr Bld: 6.9 % — ABNORMAL HIGH (ref 4.8–5.6)
Mean Plasma Glucose: 151 mg/dL

## 2015-02-19 MED ORDER — LORAZEPAM 0.5 MG PO TABS
0.5000 mg | ORAL_TABLET | Freq: Once | ORAL | Status: AC
  Administered 2015-02-19: 0.5 mg via ORAL
  Filled 2015-02-19: qty 1

## 2015-02-19 MED ORDER — GI COCKTAIL ~~LOC~~
30.0000 mL | Freq: Once | ORAL | Status: DC
  Filled 2015-02-19: qty 30

## 2015-02-19 MED ORDER — TRIAMTERENE-HCTZ 37.5-25 MG PO TABS
1.0000 | ORAL_TABLET | Freq: Every day | ORAL | Status: DC
  Administered 2015-02-19 – 2015-02-20 (×2): 1 via ORAL
  Filled 2015-02-19 (×3): qty 1

## 2015-02-19 MED ORDER — INSULIN GLARGINE 100 UNIT/ML ~~LOC~~ SOLN
8.0000 [IU] | Freq: Every day | SUBCUTANEOUS | Status: DC
  Administered 2015-02-19 – 2015-02-21 (×3): 8 [IU] via SUBCUTANEOUS
  Filled 2015-02-19 (×4): qty 0.08

## 2015-02-19 NOTE — Progress Notes (Signed)
[facility] PROGRESS NOTE  Stephanie Schultz ERX:[id number] DOB: July 08, 1956 DOA: 02/17/2015  PCP: Barbette Merino, MD  Brief HPI: 59 year old African-American female with morbid obesity, sleep apnea, chronic respiratory failure on home oxygen, COPD, presented with acutely worsening shortness of breath with hypoxia. There was some concern for aspiration. Chest x-ray did not show any infiltrate. She was admitted for further management.  Past medical history:  Past Medical History  Diagnosis Date  . Hypertension   . Anxiety   . Emphysema   . COPD (chronic obstructive pulmonary disease)   . OSA on CPAP     CPAP at night   . Dizziness     pt believes this is motion sickness or vertigo  . GERD (gastroesophageal reflux disease)   . Headache(784.0)   . Cancer 1990    cervical   . Anemia   . Morbid obesity   . Sickle cell trait   . Hx of cardiac catheterization     a. LHC at [facility] in [place], [place] 09/2008:  Normal coronary arteries EF 70%.  . Tobacco abuse     a. up to 3ppd from age 74 to 69, now 1/4 ppd (01/2013)  . Shortness of breath   . Pneumonia   . Diabetes mellitus without complication     Consultants: None  Procedures: None  Antibiotics: Initially doxycycline was given. Changed to Levaquin 4/8.  Subjective: Patient continues to feel better. Still has coughing spells which results in pain in the lower chest area. Denies chest pain, otherwise. Breathing is improving. Discussed tobacco cessation.  Objective: Vital Signs  Filed Vitals:   02/18/15 2253 02/19/15 0450 02/19/15 0746 02/19/15 0747  BP: 141/66 109/61    Pulse: 97 93    Temp: 98 F (36.7 C) 98.2 F (36.8 C)    TempSrc: Oral Oral    Resp: 20 20    Height:      Weight:      SpO2: 95% 95% 99% 99%    Intake/Output Summary (Last 24 hours) at 02/19/15 0829 Last data filed at 02/18/15 1900  Gross per 24 hour  Intake    360 ml  Output      0 ml  Net    360 ml   Filed Weights   02/17/15 2224  Weight:  95.255 kg (210 lb)    General appearance: alert, cooperative, appears stated age, no distress and morbidly obese Resp: Improved air entry today compared to yesterday. Continues to have wheezing. No definite crackles. Cardio: regular rate and rhythm, S1, S2 normal, no murmur, click, rub or gallop GI: soft, non-tender; bowel sounds normal; no masses,  no organomegaly Extremities: extremities normal, atraumatic, no cyanosis or edema Neurologic: Alert and oriented 3. No focal neurological deficits are noted.  Lab Results:  Basic Metabolic Panel:  Recent Labs Lab 02/17/15 1300 02/18/15 0408 02/19/15 0420  NA 137 138 139  K 3.7 4.2 4.4  CL 100 101 106  CO2 27 25 23   GLUCOSE 102* 215* 255*  BUN 12 17 25*  CREATININE 1.12* 1.22* 1.09  CALCIUM 9.1 9.2 9.2   Liver Function Tests:  Recent Labs Lab 02/18/15 0408  AST 32  ALT 35  ALKPHOS 36*  BILITOT 0.4  PROT 7.8  ALBUMIN 4.1   CBC:  Recent Labs Lab 02/17/15 1300 02/18/15 0408 02/19/15 0420  WBC 6.1 6.5 12.9*  NEUTROABS  --  5.8  --   HGB 11.8* 11.4* 10.8*  HCT 34.4* 34.2* 32.6*  MCV 83.5 83.8 84.2  PLT 239 227 223    CBG:  Recent Labs Lab 02/18/15 1610 02/18/15 2008 02/19/15 0011 02/19/15 0422 02/19/15 0808  GLUCAP 195* 217* 202* 214* 152*     Studies/Results: Dg Chest 2 View (if Patient Has Fever And/or Copd)  02/17/2015   CLINICAL DATA:  Neck swelling that hinders breathing, severe shortness of breath, chest pain, history COPD, GERD, sickle trait, smoker, diabetes  EXAM: CHEST  2 VIEW  COMPARISON:  01/10/2015  FINDINGS: Normal heart size, mediastinal contours and pulmonary vascularity.  Atherosclerotic calcification aorta.  Lungs minimally hyperinflated clear.  No infiltrate, pleural effusion or pneumothorax.  Scattered endplate spur formation thoracic spine with diffuse idiopathic skeletal hyperostosis.  IMPRESSION: Hyperinflation question COPD.  No acute infiltrate.   Electronically Signed   By: Lavonia Dana M.D.   On: 02/17/2015 14:54    Medications:  Scheduled: . allopurinol  100 mg Oral Daily  . budesonide-formoterol  2 puff Inhalation BID  . heparin  5,000 Units Subcutaneous 3 times per day  . hyoscyamine  0.125 mg Sublingual QID  . insulin aspart  0-9 Units Subcutaneous 6 times per day  . ipratropium-albuterol  3 mL Nebulization QID  . levofloxacin  500 mg Oral Daily  . loratadine  10 mg Oral Daily  . methylPREDNISolone (SOLU-MEDROL) injection  60 mg Intravenous Q6H  . pantoprazole  40 mg Oral Daily  . polyethylene glycol  17 g Oral Daily  . pregabalin  75 mg Oral BID  . tiotropium  18 mcg Inhalation Daily  . triamterene-hydrochlorothiazide  1 tablet Oral Daily  . zolpidem  5 mg Oral QHS   Continuous: . sodium chloride 1,000 mL (02/19/15 0411)   JSE:[id number], ipratropium-albuterol  Assessment/Plan:  Principal Problem:   Acute respiratory failure with hypoxia Active Problems:   COPD (chronic obstructive pulmonary disease)   Diabetes mellitus type 2, controlled   Hypertension   Obstructive sleep apnea   Acute on chronic respiratory failure    Acute on chronic resp failure w/ hypoxia Likely secondary to COPD flare from subacute aspiration event. Patient is improving slowly. Continue current treatment including nebulizer treatments, steroids, antibiotics, oxygen. Smoking cessation has been discussed. She is on chronic home oxygen therapy.  Acute COPD exacerbation As above. Slowly improving. Continue antibiotics and steroids. Along with nebulizer treatments.  Type 2 DM, uncontrolled due to steroids High blood sugars secondary to steroids. Add low-dose Lantus. Continue SSI for now. Her oral agents are being held. HbA1c is 6.9.  Essential HTN BP stable. Continue current regimen.  Elevated creatinine Improve with hydration. Monitor closely.  OSA Continue CPAP  DVT Prophylaxis: Heparin    Code Status: Full code  Family Communication: Discussed with the  patient  Disposition Plan: Not ready for discharge. She comes from home and will likely return when improved.    LOS: 1 day   [place] [facility] Pager [phone] 02/19/2015, 8:29 AM  If 7PM-7AM, please contact night-coverage at [email or web address], password [facility]

## 2015-02-20 LAB — GLUCOSE, CAPILLARY
GLUCOSE-CAPILLARY: 159 mg/dL — AB (ref 70–99)
Glucose-Capillary: 143 mg/dL — ABNORMAL HIGH (ref 70–99)
Glucose-Capillary: 169 mg/dL — ABNORMAL HIGH (ref 70–99)
Glucose-Capillary: 174 mg/dL — ABNORMAL HIGH (ref 70–99)
Glucose-Capillary: 182 mg/dL — ABNORMAL HIGH (ref 70–99)
Glucose-Capillary: 223 mg/dL — ABNORMAL HIGH (ref 70–99)

## 2015-02-20 MED ORDER — IPRATROPIUM-ALBUTEROL 0.5-2.5 (3) MG/3ML IN SOLN
3.0000 mL | RESPIRATORY_TRACT | Status: DC
  Administered 2015-02-20 – 2015-02-21 (×5): 3 mL via RESPIRATORY_TRACT
  Filled 2015-02-20 (×5): qty 3

## 2015-02-20 MED ORDER — ALPRAZOLAM 0.5 MG PO TABS
0.5000 mg | ORAL_TABLET | Freq: Two times a day (BID) | ORAL | Status: DC | PRN
  Administered 2015-02-20 – 2015-02-22 (×5): 0.5 mg via ORAL
  Filled 2015-02-20 (×5): qty 1

## 2015-02-20 NOTE — Progress Notes (Signed)
Pt stated that she would self administer home CPAP when ready for bed.  RT inspected Pt's machine for frayed or damaged cords, Pt's machine is in proper working order.  Pt to call if any assistance is required throughout the night.  RT to monitor and assess as needed.

## 2015-02-20 NOTE — Progress Notes (Signed)
[facility] PROGRESS NOTE  Stephanie Schultz GDJ:[id number] DOB: Dec 07, 1955 DOA: 02/17/2015  PCP: Barbette Merino, MD  Brief HPI: 59 year old African-American female with morbid obesity, sleep apnea, chronic respiratory failure on home oxygen, COPD, presented with acutely worsening shortness of breath with hypoxia. There was some concern for aspiration. Chest x-ray did not show any infiltrate. She was admitted for further management.  Past medical history:  Past Medical History  Diagnosis Date  . Hypertension   . Anxiety   . Emphysema   . COPD (chronic obstructive pulmonary disease)   . OSA on CPAP     CPAP at night   . Dizziness     pt believes this is motion sickness or vertigo  . GERD (gastroesophageal reflux disease)   . Headache(784.0)   . Cancer 1990    cervical   . Anemia   . Morbid obesity   . Sickle cell trait   . Hx of cardiac catheterization     a. LHC at [facility] in [place], [place] 09/2008:  Normal coronary arteries EF 70%.  . Tobacco abuse     a. up to 3ppd from age 34 to 89, now 1/4 ppd (01/2013)  . Shortness of breath   . Pneumonia   . Diabetes mellitus without complication     Consultants: None  Procedures: None  Antibiotics: Initially doxycycline was given. Changed to Levaquin 4/8.  Subjective: Patient feels very anxious this morning. Breathing is better as long as she is sitting down. With exertion. She does get short of breath which triggers her anxiety. No other complaints offered.   Objective: Vital Signs  Filed Vitals:   02/19/15 2021 02/19/15 2330 02/20/15 0633 02/20/15 0818  BP: 143/71  131/80   Pulse: 91  94   Temp: 97.9 F (36.6 C)  98.1 F (36.7 C)   TempSrc: Oral  Oral   Resp: 20  20   Height:      Weight:      SpO2: 97% 97% 95% 96%    Intake/Output Summary (Last 24 hours) at 02/20/15 0913 Last data filed at 02/20/15 0700  Gross per 24 hour  Intake 3976.67 ml  Output    400 ml  Net 3576.67 ml   Filed Weights   02/17/15 2224    Weight: 95.255 kg (210 lb)    General appearance: alert, cooperative, appears anxious, no distress and morbidly obese Resp: Air entry is improving. Continues to have spend expiratory wheezing. No definite crackles. Cardio: regular rate and rhythm, S1, S2 normal, no murmur, click, rub or gallop GI: soft, non-tender; bowel sounds normal; no masses,  no organomegaly Extremities: extremities normal, atraumatic, no cyanosis or edema Neurologic: Alert and oriented 3. No focal neurological deficits are noted.  Lab Results:  Basic Metabolic Panel:  Recent Labs Lab 02/17/15 1300 02/18/15 0408 02/19/15 0420  NA 137 138 139  K 3.7 4.2 4.4  CL 100 101 106  CO2 27 25 23   GLUCOSE 102* 215* 255*  BUN 12 17 25*  CREATININE 1.12* 1.22* 1.09  CALCIUM 9.1 9.2 9.2   Liver Function Tests:  Recent Labs Lab 02/18/15 0408  AST 32  ALT 35  ALKPHOS 36*  BILITOT 0.4  PROT 7.8  ALBUMIN 4.1   CBC:  Recent Labs Lab 02/17/15 1300 02/18/15 0408 02/19/15 0420  WBC 6.1 6.5 12.9*  NEUTROABS  --  5.8  --   HGB 11.8* 11.4* 10.8*  HCT 34.4* 34.2* 32.6*  MCV 83.5 83.8 84.2  PLT 239 227  223    CBG:  Recent Labs Lab 02/19/15 1638 02/19/15 2021 02/20/15 0016 02/20/15 0622 02/20/15 0740  GLUCAP 192* 182* 223* 143* 159*     Studies/Results: No results found.  Medications:  Scheduled: . allopurinol  100 mg Oral Daily  . budesonide-formoterol  2 puff Inhalation BID  . gi cocktail  30 mL Oral Once  . heparin  5,000 Units Subcutaneous 3 times per day  . hyoscyamine  0.125 mg Sublingual QID  . insulin aspart  0-9 Units Subcutaneous 6 times per day  . insulin glargine  8 Units Subcutaneous Daily  . ipratropium-albuterol  3 mL Nebulization QID  . levofloxacin  500 mg Oral Daily  . loratadine  10 mg Oral Daily  . methylPREDNISolone (SOLU-MEDROL) injection  60 mg Intravenous Q6H  . pantoprazole  40 mg Oral Daily  . polyethylene glycol  17 g Oral Daily  . pregabalin  75 mg Oral BID   . tiotropium  18 mcg Inhalation Daily  . triamterene-hydrochlorothiazide  1 tablet Oral Daily  . zolpidem  5 mg Oral QHS   Continuous: . sodium chloride 50 mL/hr at 02/20/15 0023   XYB:[id number], ALPRAZolam, ipratropium-albuterol  Assessment/Plan:  Principal Problem:   Acute respiratory failure with hypoxia Active Problems:   COPD (chronic obstructive pulmonary disease)   Diabetes mellitus type 2, controlled   Hypertension   Obstructive sleep apnea   Acute on chronic respiratory failure    Acute on chronic resp failure w/ hypoxia Likely secondary to COPD flare from subacute aspiration event. Patient is improving slowly. Continue current treatment including nebulizer treatments, steroids, antibiotics, oxygen. Smoking cessation has been discussed. She is on chronic home oxygen therapy. Anxiolytics.  Acute COPD exacerbation As above. Slowly improving. Continue antibiotics and steroids. Along with nebulizer treatments.  Type 2 DM, uncontrolled due to steroids High blood sugars secondary to steroids. CABG improved with Lantus. Continue for now. Continue SSI for now. Her oral agents are being held. HbA1c is 6.9.  Essential HTN BP stable. Continue current regimen.  Elevated creatinine Improve with hydration. Monitor closely.  OSA Continue CPAP  DVT Prophylaxis: Heparin    Code Status: Full code  Family Communication: Discussed with the patient  Disposition Plan: Not ready for discharge. She comes from home and will likely return when improved. Start mobilizing.    LOS: 2 days   [place] [facility] Pager [phone] 02/20/2015, 9:13 AM  If 7PM-7AM, please contact night-coverage at [email or web address], password [facility]

## 2015-02-20 NOTE — Progress Notes (Signed)
Pt states that she will call for her CPAP after her night time meds have been administered.  RT will monitor and assess as needed.

## 2015-02-21 DIAGNOSIS — F411 Generalized anxiety disorder: Secondary | ICD-10-CM

## 2015-02-21 DIAGNOSIS — J441 Chronic obstructive pulmonary disease with (acute) exacerbation: Secondary | ICD-10-CM

## 2015-02-21 LAB — GLUCOSE, CAPILLARY
GLUCOSE-CAPILLARY: 139 mg/dL — AB (ref 70–99)
Glucose-Capillary: 157 mg/dL — ABNORMAL HIGH (ref 70–99)
Glucose-Capillary: 171 mg/dL — ABNORMAL HIGH (ref 70–99)
Glucose-Capillary: 173 mg/dL — ABNORMAL HIGH (ref 70–99)
Glucose-Capillary: 260 mg/dL — ABNORMAL HIGH (ref 70–99)

## 2015-02-21 LAB — BASIC METABOLIC PANEL
ANION GAP: 11 (ref 5–15)
BUN: 28 mg/dL — ABNORMAL HIGH (ref 6–23)
CALCIUM: 8.8 mg/dL (ref 8.4–10.5)
CHLORIDE: 105 mmol/L (ref 96–112)
CO2: 25 mmol/L (ref 19–32)
Creatinine, Ser: 1.25 mg/dL — ABNORMAL HIGH (ref 0.50–1.10)
GFR calc Af Amer: 55 mL/min — ABNORMAL LOW (ref >90)
GFR calc non Af Amer: 47 mL/min — ABNORMAL LOW (ref >90)
GLUCOSE: 210 mg/dL — AB (ref 70–99)
POTASSIUM: 4.2 mmol/L (ref 3.5–5.1)
SODIUM: 141 mmol/L (ref 135–145)

## 2015-02-21 MED ORDER — IPRATROPIUM-ALBUTEROL 0.5-2.5 (3) MG/3ML IN SOLN
3.0000 mL | Freq: Four times a day (QID) | RESPIRATORY_TRACT | Status: DC
  Administered 2015-02-21: 3 mL via RESPIRATORY_TRACT
  Filled 2015-02-21: qty 3

## 2015-02-21 MED ORDER — INSULIN ASPART 100 UNIT/ML ~~LOC~~ SOLN: 0.0000 [IU] | Freq: Every day | SUBCUTANEOUS | Status: DC

## 2015-02-21 MED ORDER — METHYLPREDNISOLONE SODIUM SUCC 125 MG IJ SOLR
60.0000 mg | Freq: Two times a day (BID) | INTRAMUSCULAR | Status: DC
  Administered 2015-02-21 – 2015-02-22 (×2): 60 mg via INTRAVENOUS
  Filled 2015-02-21 (×4): qty 0.96

## 2015-02-21 MED ORDER — INSULIN ASPART 100 UNIT/ML ~~LOC~~ SOLN
0.0000 [IU] | Freq: Three times a day (TID) | SUBCUTANEOUS | Status: DC
  Administered 2015-02-21: 2 [IU] via SUBCUTANEOUS
  Administered 2015-02-21: 1 [IU] via SUBCUTANEOUS
  Administered 2015-02-22: 2 [IU] via SUBCUTANEOUS

## 2015-02-21 MED ORDER — HYDROCODONE-ACETAMINOPHEN 7.5-325 MG PO TABS
1.0000 | ORAL_TABLET | Freq: Four times a day (QID) | ORAL | Status: DC | PRN
  Administered 2015-02-21: 1 via ORAL
  Filled 2015-02-21: qty 1

## 2015-02-21 NOTE — Evaluation (Signed)
Occupational Therapy Evaluation Patient Details Name: Stephanie Schultz MRN: [id number] DOB: 1956-05-03 Today's Date: 02/21/2015    History of Present Illness This is a 59 y.o. year old female with significant past medical history of morbid obesity, OSA, chronic resp failure on 2L [place] chronically, type 2 DM, COPD, tobacco abuse presenting 02/17/15 with acute on chronic resp failure w/ hypoxia   Clinical Impression   Pt moves quickly and is impulsive at times with not waiting for lines to be untangled/managed. O2 on 2L with activity was 95%. Encouraged plenty of rest breaks and sitting for much of bath. Will follow on acute for continued energy conservation education and progress ADL independence and safety.     Follow Up Recommendations  No OT follow up;Supervision/Assistance - 24 hour    Equipment Recommendations  None recommended by OT    Recommendations for Other Services       Precautions / Restrictions Precautions Precautions: Fall Precaution Comments: on oxygen      Mobility Bed Mobility                 Transfers Overall transfer level: Needs assistance Equipment used: None Transfers: Sit to/from Stand Sit to Stand: Supervision         General transfer comment: pt moves quickly. supevision for safety as lines tangled and pt didnt wait for them to be untangled.    Balance Overall balance assessment: Needs assistance         Standing balance support: During functional activity Standing balance-Leahy Scale: Good                              ADL Overall ADL's : Needs assistance/impaired Eating/Feeding: Independent;Sitting   Grooming: Wash/dry face;Set up;Sitting   Upper Body Bathing: Set up;Sitting   Lower Body Bathing: Min guard;Sit to/from stand   Upper Body Dressing : Set up;Sitting   Lower Body Dressing: Min guard;Sit to/from stand   Toilet Transfer: Min guard;Ambulation   Toileting- Clothing Manipulation and Hygiene: Min  guard;Sit to/from stand         General ADL Comments: Pt moves very quickly and stood from recliner before OT had IV pole unplugged and lines untanbled. Pt took off O2 in an attempt to also untangle O2 tubing and nasal cannula briefly hit the floor. Pt was offered new nasal cannula but pt declined a new one. She allowed OT to wipe off nose piece but then pt put it back on, still refusing for OT to replace cannula. Pt moves very quickly and even with sponge bathing in the bathroom, needed frequent cues to slow down as she was becoming fatigued and displayed decreased safety. O2 on 2L was 95%. Encouraged PLB and sitting for as much of task as possible.      Vision     Perception     Praxis      Pertinent Vitals/Pain Pain Assessment: No/denies pain  95% on 2L during bathing task.     Hand Dominance     Extremity/Trunk Assessment Upper Extremity Assessment Upper Extremity Assessment: Overall WFL for tasks assessed      Cervical / Trunk Assessment Cervical / Trunk Assessment: Normal   Communication Communication Communication: No difficulties   Cognition Arousal/Alertness: Awake/alert Behavior During Therapy: Impulsive Overall Cognitive Status: Within Functional Limits for tasks assessed                     General Comments  Exercises       Shoulder Instructions      Home Living Family/patient expects to be discharged to:: Private residence Living Arrangements: Alone Available Help at Discharge: Personal care attendant Type of Home: Apartment Home Access: Level entry     Home Layout: One level     Bathroom Shower/Tub: Teacher, early years/pre: Handicapped height     Home Equipment: Environmental consultant - 4 wheels;Cane - single point;Grab bars - toilet;Grab bars - tub/shower;Shower seat   Additional Comments: O2      Prior Functioning/Environment          Comments: Amb, without assist, has aide for chores, aide is also present for showers.  helps with meals.    OT Diagnosis: Generalized weakness   OT Problem List: Decreased strength;Decreased knowledge of use of DME or AE;Decreased activity tolerance   OT Treatment/Interventions: Self-care/ADL training;Patient/family education;Therapeutic activities;DME and/or AE instruction;Energy conservation    OT Goals(Current goals can be found in the care plan section) Acute Rehab OT Goals Patient Stated Goal: home OT Goal Formulation: With patient Time For Goal Achievement: 03/07/15 Potential to Achieve Goals: Good  OT Frequency: Min 2X/week   Barriers to D/C:            Co-evaluation              End of Session Equipment Utilized During Treatment: Oxygen  Activity Tolerance: Patient limited by fatigue Patient left: in chair;with call bell/phone within reach   Time: 0925-1007 OT Time Calculation (min): 42 min Charges:  OT General Charges $OT Visit: 1 Procedure OT Evaluation $Initial OT Evaluation Tier I: 1 Procedure OT Treatments $Self Care/Home Management : 8-22 mins $Therapeutic Activity: 8-22 mins G-Codes:    Jules Schick  [phone] 02/21/2015, 11:20 AM

## 2015-02-21 NOTE — Progress Notes (Signed)
[facility] PROGRESS NOTE  Stephanie Schultz LEX:[id number] DOB: 30-Jun-1956 DOA: 02/17/2015  PCP: Barbette Merino, MD  Brief HPI: 59 year old African-American female with morbid obesity, sleep apnea, chronic respiratory failure on home oxygen, COPD, presented with acutely worsening shortness of breath with hypoxia. There was some concern for aspiration. Chest x-ray did not show any infiltrate. She was admitted for further management.  Past medical history:  Past Medical History  Diagnosis Date  . Hypertension   . Anxiety   . Emphysema   . COPD (chronic obstructive pulmonary disease)   . OSA on CPAP     CPAP at night   . Dizziness     pt believes this is motion sickness or vertigo  . GERD (gastroesophageal reflux disease)   . Headache(784.0)   . Cancer 1990    cervical   . Anemia   . Morbid obesity   . Sickle cell trait   . Hx of cardiac catheterization     a. LHC at [facility] in [place], [place] 09/2008:  Normal coronary arteries EF 70%.  . Tobacco abuse     a. up to 3ppd from age 78 to 89, now 1/4 ppd (01/2013)  . Shortness of breath   . Pneumonia   . Diabetes mellitus without complication     Consultants: None  Procedures: None  Antibiotics: Initially doxycycline was given. Changed to Levaquin 4/8.  Subjective: Patient feels better this morning. Shortness of breath is improved. Wheezing is improved. Anxiety is improved. Hasn't ambulated yet.  Objective: Vital Signs  Filed Vitals:   02/20/15 2025 02/20/15 2038 02/20/15 2317 02/21/15 0546  BP: 146/82   149/73  Pulse: 78   83  Temp: 97.5 F (36.4 C)   97.9 F (36.6 C)  TempSrc: Oral   Oral  Resp: 20   20  Height:      Weight:      SpO2: 98% 95% 98% 97%    Intake/Output Summary (Last 24 hours) at 02/21/15 0740 Last data filed at 02/20/15 1815  Gross per 24 hour  Intake   1480 ml  Output    600 ml  Net    880 ml   Filed Weights   02/17/15 2224  Weight: 95.255 kg (210 lb)    General appearance: alert,  cooperative, anxious, no distress and morbidly obese Resp: Air entry is improving. Wheezing is better. No definite crackles. Cardio: regular rate and rhythm, S1, S2 normal, no murmur, click, rub or gallop GI: soft, non-tender; bowel sounds normal; no masses,  no organomegaly Extremities: extremities normal, atraumatic, no cyanosis or edema Neurologic: Alert and oriented 3. No focal neurological deficits are noted.  Lab Results:  Basic Metabolic Panel:  Recent Labs Lab 02/17/15 1300 02/18/15 0408 02/19/15 0420 02/21/15 0434  NA 137 138 139 141  K 3.7 4.2 4.4 4.2  CL 100 101 106 105  CO2 27 25 23 25   GLUCOSE 102* 215* 255* 210*  BUN 12 17 25* 28*  CREATININE 1.12* 1.22* 1.09 1.25*  CALCIUM 9.1 9.2 9.2 8.8   Liver Function Tests:  Recent Labs Lab 02/18/15 0408  AST 32  ALT 35  ALKPHOS 36*  BILITOT 0.4  PROT 7.8  ALBUMIN 4.1   CBC:  Recent Labs Lab 02/17/15 1300 02/18/15 0408 02/19/15 0420  WBC 6.1 6.5 12.9*  NEUTROABS  --  5.8  --   HGB 11.8* 11.4* 10.8*  HCT 34.4* 34.2* 32.6*  MCV 83.5 83.8 84.2  PLT 239 227 223  CBG:  Recent Labs Lab 02/20/15 1108 02/20/15 1631 02/20/15 2003 02/20/15 2333 02/21/15 0351  GLUCAP 169* 174* 182* 260* 173*     Studies/Results: No results found.  Medications:  Scheduled: . allopurinol  100 mg Oral Daily  . budesonide-formoterol  2 puff Inhalation BID  . gi cocktail  30 mL Oral Once  . heparin  5,000 Units Subcutaneous 3 times per day  . hyoscyamine  0.125 mg Sublingual QID  . insulin aspart  0-9 Units Subcutaneous 6 times per day  . insulin glargine  8 Units Subcutaneous Daily  . ipratropium-albuterol  3 mL Nebulization Q4H  . levofloxacin  500 mg Oral Daily  . loratadine  10 mg Oral Daily  . methylPREDNISolone (SOLU-MEDROL) injection  60 mg Intravenous Q12H  . pantoprazole  40 mg Oral Daily  . polyethylene glycol  17 g Oral Daily  . pregabalin  75 mg Oral BID  . tiotropium  18 mcg Inhalation Daily  .  zolpidem  5 mg Oral QHS   Continuous: . sodium chloride 50 mL/hr at 02/20/15 0023   MKL:[id number], ALPRAZolam, HYDROcodone-acetaminophen, ipratropium-albuterol  Assessment/Plan:  Principal Problem:   Acute respiratory failure with hypoxia Active Problems:   COPD (chronic obstructive pulmonary disease)   Diabetes mellitus type 2, controlled   Hypertension   Obstructive sleep apnea   Acute on chronic respiratory failure    Acute on chronic resp failure w/ hypoxia Likely secondary to COPD flare from subacute aspiration event. Patient is improving slowly. Continue current treatment including nebulizer treatments, steroids, antibiotics, oxygen. Cut back on steroids. Smoking cessation has been discussed. She is on chronic home oxygen therapy. Anxiety is improved with Anxiolytics.  Acute COPD exacerbation As above. Slowly improving. Continue antibiotics and steroids. Along with nebulizer treatments. Elevated WBC secondary to steroids.  Type 2 DM, uncontrolled due to steroids High blood sugars secondary to steroids. CBG improved with Lantus. Continue for now. Continue SSI for now. Her oral agents are being held. HbA1c is 6.9.  Essential HTN BP stable. Continue current regimen.  Elevated creatinine Creatinine is fluctuating but more or less improved with hydration. Monitor closely. Making urine.  OSA Continue CPAP  DVT Prophylaxis: Heparin    Code Status: Full code  Family Communication: Discussed with the patient  Disposition Plan: Ambulate today with PT and OT. Cut back on steroids. Anxiety is improved. Hopefully discharge in the next 1-2 days.     LOS: 3 days   [place] [facility] Pager [phone] 02/21/2015, 7:40 AM  If 7PM-7AM, please contact night-coverage at [email or web address], password [facility]

## 2015-02-21 NOTE — Progress Notes (Signed)
Pt placed on home CPAP with 2 LPM O2 bleed in.  Pt tolerating well at this time, RT to monitor and assess as needed.

## 2015-02-21 NOTE — Progress Notes (Signed)
Inpatient Diabetes Program Recommendations  AACE/ADA: New Consensus Statement on Inpatient Glycemic Control (2013)  Target Ranges:  Prepandial:   less than 140 mg/dL      Peak postprandial:   less than 180 mg/dL (1-2 hours)      Critically ill patients:  140 - 180 mg/dL     Results for KEAUNNA, SKIPPER (MRN [id number]) as of 02/21/2015 09:26  Ref. Range 02/20/2015 00:16 02/20/2015 06:22 02/20/2015 07:40 02/20/2015 11:08 02/20/2015 16:31 02/20/2015 20:03  Glucose-Capillary Latest Range: 70-99 mg/dL 223 (H) 143 (H) 159 (H) 169 (H) 174 (H) 182 (H)    Results for AVYN, ADEN (MRN [id number]) as of 02/21/2015 09:26  Ref. Range 02/20/2015 23:33 02/21/2015 03:51 02/21/2015 07:58  Glucose-Capillary Latest Range: 70-99 mg/dL 260 (H) 173 (H) 157 (H)     Current Orders: Lantus 8 units daily     Novolog Sensitive SSI Q4 hours     MD- Please consider changing Novolog SSI to Sensitive scale tid ac + HS (currently ordered as Q4 hour coverage and patient is eating solid PO diet at 100% of meals)     Will follow Wyn Quaker RN, MSN, CDE Diabetes Coordinator Inpatient Diabetes Program Team Pager: [phone] (8a-5p)

## 2015-02-21 NOTE — Care Management Note (Signed)
CARE MANAGEMENT NOTE 02/21/2015  Patient:  Stephanie Schultz,Stephanie Schultz   Account Number:  [id number]  Date Initiated:  02/21/2015  Documentation initiated by:  Marney Doctor  Subjective/Objective Assessment:   59 yo admitted with Acute respiratory failure     Action/Plan:   From home alone at senior appartments [facility] and plans to return there   Anticipated DC Date:  02/22/2015   Anticipated DC Plan:  [place]  CM consult      [place]   Choice offered to / List presented to:  C-1 Patient        [place] arranged  HH-1 RN      [place].   Status of service:  In process, will continue to follow Medicare Important Message given?   (If response is "NO", the following Medicare IM given date fields will be blank) Date Medicare IM given:   Medicare IM given by:   Date Additional Medicare IM given:   Additional Medicare IM given by:    Discharge Disposition:    Per UR Regulation:  Reviewed for med. necessity/level of care/duration of stay  If discussed at [place] of Stay Meetings, dates discussed:    Comments:  02/21/15 Marney Doctor RN,BSN,NCM [phone] PT is recommending HHPT at DC. Pt offered choice for [facility] providers and chose [facility] due to she uses them for her 02 at home already. Pt informed that because she has Medicaid she would be able to receive a HHRN for safety eval instead of HHPT. Pt ok with this. Pt has CPAP, Shower chair with back, Raised toilet seat with rails, Oxygen, Grab bars in shower, Grab bars around toilet, CBG Meter at home and states she doesn't need any other equipment. Pt has home 02 in her hospital room to travel home with. No other DC needs noted. Will need MD order for [facility] for safety eval.

## 2015-02-21 NOTE — Evaluation (Signed)
Physical Therapy Evaluation Patient Details Name: Neta Upadhyay MRN: [id number] DOB: [birth year] Today's Date: 02/21/2015   History of Present Illness  This is a 59 y.o. year old female with significant past medical history of morbid obesity, OSA, chronic resp failure on 2L [place] chronically, type 2 DM, COPD, tobacco abuse presenting 02/17/15 with acute on chronic resp failure w/ hypoxia  Clinical Impression  Patient tolerated  Well, sats dropped to 88% on 2 l after transfer to bsc and ambulation. Returns to > 94% with rest and pursed lip breaths. Patient will benefit from PT to address problems listed in note below. Patient has Home O2,    Follow Up Recommendations Home health PT;Supervision - Intermittent    Equipment Recommendations  None recommended by PT    Recommendations for Other Services       Precautions / Restrictions Precautions Precaution Comments: on oxygen      Mobility  Bed Mobility Overal bed mobility: Independent                Transfers Overall transfer level: Independent                  Ambulation/Gait Ambulation/Gait assistance: Supervision Ambulation Distance (Feet): 200 Feet Assistive device: 1 person hand held assist Gait Pattern/deviations: Step-through pattern     General Gait Details: no issues noted, intermittent HHA, generally independent, just assist with O2 and IV pole  Stairs            Wheelchair Mobility    Modified Rankin (Stroke Patients Only)       Balance Overall balance assessment: Needs assistance         Standing balance support: During functional activity Standing balance-Leahy Scale: Good                               Pertinent Vitals/Pain Pain Assessment: No/denies pain    Home Living Family/patient expects to be discharged to:: Private residence Living Arrangements: Alone Available Help at Discharge: Personal care attendant Type of Home: Apartment Home Access: Level entry      Home Layout: One level Home Equipment: Walker - 4 wheels;Cane - single point;Tub bench Additional Comments: O2, mobilie concentrator    Prior Function           Comments: Amb, without assist, has aide for chores,     Hand Dominance        Extremity/Trunk Assessment               Lower Extremity Assessment: Overall WFL for tasks assessed      Cervical / Trunk Assessment: Normal  Communication   Communication: No difficulties  Cognition Arousal/Alertness: Awake/alert Behavior During Therapy: WFL for tasks assessed/performed Overall Cognitive Status: Within Functional Limits for tasks assessed                      General Comments      Exercises        Assessment/Plan    PT Assessment Patient needs continued PT services  PT Diagnosis Difficulty walking   PT Problem List Decreased activity tolerance;Cardiopulmonary status limiting activity  PT Treatment Interventions Gait training;Functional mobility training   PT Goals (Current goals can be found in the Care Plan section) Acute Rehab PT Goals Patient Stated Goal: to go home, walk more PT Goal Formulation: With patient Time For Goal Achievement: 03/07/15 Potential to Achieve Goals: Good    Frequency Min  3X/week   Barriers to discharge        Co-evaluation               End of Session   Activity Tolerance: Patient tolerated treatment well Patient left: in chair;with call bell/phone within reach Nurse Communication: Mobility status         Time: 0370-4888 PT Time Calculation (min) (ACUTE ONLY): 20 min   Charges:   PT Evaluation $Initial PT Evaluation Tier I: 1 Procedure     PT G CodesClaretha Cooper 02/21/2015, 10:25 AM Tresa Endo PT [phone]

## 2015-02-22 LAB — BASIC METABOLIC PANEL
Anion gap: 9 (ref 5–15)
BUN: 32 mg/dL — AB (ref 6–23)
CO2: 26 mmol/L (ref 19–32)
CREATININE: 1.26 mg/dL — AB (ref 0.50–1.10)
Calcium: 8.7 mg/dL (ref 8.4–10.5)
Chloride: 106 mmol/L (ref 96–112)
GFR calc Af Amer: 54 mL/min — ABNORMAL LOW (ref >90)
GFR, EST NON AFRICAN AMERICAN: 47 mL/min — AB (ref >90)
Glucose, Bld: 149 mg/dL — ABNORMAL HIGH (ref 70–99)
Potassium: 3.8 mmol/L (ref 3.5–5.1)
Sodium: 141 mmol/L (ref 135–145)

## 2015-02-22 LAB — GLUCOSE, CAPILLARY: Glucose-Capillary: 184 mg/dL — ABNORMAL HIGH (ref 70–99)

## 2015-02-22 MED ORDER — ALPRAZOLAM 0.5 MG PO TABS: 0.5000 mg | ORAL_TABLET | Freq: Two times a day (BID) | ORAL | Status: DC | PRN

## 2015-02-22 MED ORDER — IPRATROPIUM-ALBUTEROL 0.5-2.5 (3) MG/3ML IN SOLN
3.0000 mL | RESPIRATORY_TRACT | Status: DC
  Administered 2015-02-22: 3 mL via RESPIRATORY_TRACT
  Filled 2015-02-22 (×2): qty 3

## 2015-02-22 MED ORDER — IPRATROPIUM-ALBUTEROL 0.5-2.5 (3) MG/3ML IN SOLN: 3.0000 mL | Freq: Four times a day (QID) | RESPIRATORY_TRACT | Status: DC | PRN

## 2015-02-22 MED ORDER — PREDNISONE 20 MG PO TABS: ORAL_TABLET | ORAL | Status: DC

## 2015-02-22 MED ORDER — LEVOFLOXACIN 500 MG PO TABS: 500.0000 mg | ORAL_TABLET | Freq: Every day | ORAL | Status: DC

## 2015-02-22 NOTE — Progress Notes (Signed)
Pt walked 100' on 3L O2 with minimal assist, minimal SOB. No c/o. Kept a good pace

## 2015-02-22 NOTE — Discharge Summary (Signed)
[facility]  Physician Discharge Summary   Patient ID: Stephanie Schultz MRN: [id number] DOB/AGE: 57/21/1957 57 y.o.  Admit date: 02/17/2015 Discharge date: 02/22/2015  PCP: Barbette Merino, MD  DISCHARGE DIAGNOSES:  Principal Problem:   Acute respiratory failure with hypoxia Active Problems:   COPD (chronic obstructive pulmonary disease)   Diabetes mellitus type 2, controlled   Hypertension   Obstructive sleep apnea   Acute on chronic respiratory failure   RECOMMENDATIONS FOR OUTPATIENT FOLLOW UP: 1. Close follow-up with PCP 2. Home health RN and PT arranged 3. Patient is to discuss her anxiety with her PCP. 4. Consider checking basic metabolic panel next week.   DISCHARGE CONDITION: fair  Diet recommendation: Modified carbohydrate  Filed Weights   02/17/15 2224  Weight: 95.255 kg (210 lb)    INITIAL HISTORY: 59 year old African-American female with morbid obesity, sleep apnea, chronic respiratory failure on home oxygen, COPD, presented with acutely worsening shortness of breath with hypoxia. There was some concern for aspiration. Chest x-ray did not show any infiltrate. She was admitted for further management.  Consultations:  None  Procedures:  None  HOSPITAL COURSE:   Acute on chronic resp failure w/ hypoxia Resolved. This was likely secondary to COPD flare from subacute aspiration event. She was started on treatments including nebulizers, steroids, antibiotics and oxygen. She is slowly improved. There was also a component of anxiety. She was given Xanax. She was asked to discuss this issue further with her PCP. If she does have long-term anxiety. She may benefit from medication such as Lexapro.   Acute COPD exacerbation As above. She is now back to baseline. She'll be discharged on tapering doses of steroids in a few more days of antibiotics.  Type 2 DM, uncontrolled due to steroids She experienced High blood sugars secondary to steroids. He was started  on Lantus. With tapering steroids. Her blood sugar should get better control. She may resume her oral agents. HbA1c is 6.9.  Essential HTN BP stable. Continue current regimen.  Elevated creatinine Creatinine is fluctuating but more or less improved with hydration. Making urine.  OSA Continue CPAP  Overall improved. Patient wants to go home. She is ambulated without much difficulty. She has oxygen at home. Stable for discharge.   PERTINENT LABS:  The results of significant diagnostics from this hospitalization (including imaging, microbiology, ancillary and laboratory) are listed below for reference.     Labs: Basic Metabolic Panel:  Recent Labs Lab 02/17/15 1300 02/18/15 0408 02/19/15 0420 02/21/15 0434 02/22/15 0450  NA 137 138 139 141 141  K 3.7 4.2 4.4 4.2 3.8  CL 100 101 106 105 106  CO2 27 25 23 25 26   GLUCOSE 102* 215* 255* 210* 149*  BUN 12 17 25* 28* 32*  CREATININE 1.12* 1.22* 1.09 1.25* 1.26*  CALCIUM 9.1 9.2 9.2 8.8 8.7   Liver Function Tests:  Recent Labs Lab 02/18/15 0408  AST 32  ALT 35  ALKPHOS 36*  BILITOT 0.4  PROT 7.8  ALBUMIN 4.1   CBC:  Recent Labs Lab 02/17/15 1300 02/18/15 0408 02/19/15 0420  WBC 6.1 6.5 12.9*  NEUTROABS  --  5.8  --   HGB 11.8* 11.4* 10.8*  HCT 34.4* 34.2* 32.6*  MCV 83.5 83.8 84.2  PLT 239 227 223   Cardiac Enzymes:  Recent Labs Lab 02/18/15 1016  TROPONINI <0.03   CBG:  Recent Labs Lab 02/21/15 0351 02/21/15 0758 02/21/15 1156 02/21/15 2149 02/22/15 0728  GLUCAP 173* 157* 139* 171* 184*  IMAGING STUDIES Dg Chest 2 View (if Patient Has Fever And/or Copd)  02/17/2015   CLINICAL DATA:  Neck swelling that hinders breathing, severe shortness of breath, chest pain, history COPD, GERD, sickle trait, smoker, diabetes  EXAM: CHEST  2 VIEW  COMPARISON:  01/10/2015  FINDINGS: Normal heart size, mediastinal contours and pulmonary vascularity.  Atherosclerotic calcification aorta.  Lungs minimally  hyperinflated clear.  No infiltrate, pleural effusion or pneumothorax.  Scattered endplate spur formation thoracic spine with diffuse idiopathic skeletal hyperostosis.  IMPRESSION: Hyperinflation question COPD.  No acute infiltrate.   Electronically Signed   By: Lavonia Dana M.D.   On: 02/17/2015 14:54    DISCHARGE EXAMINATION: Filed Vitals:   02/21/15 2104 02/21/15 2151 02/22/15 0525 02/22/15 0839  BP:  135/69 138/78   Pulse:  82 95   Temp:  97.8 F (36.6 C) 97.5 F (36.4 C)   TempSrc:  Oral Oral   Resp:  18 18   Height:      Weight:      SpO2: 98% 98% 98% 98%   General appearance: alert, cooperative, appears stated age and no distress Resp: Much improved air entry bilaterally. No wheezing. Cardio: regular rate and rhythm, S1, S2 normal, no murmur, click, rub or gallop GI: soft, non-tender; bowel sounds normal; no masses,  no organomegaly  DISPOSITION: Home  Discharge Instructions    Call MD for:  difficulty breathing, headache or visual disturbances    Complete by:  As directed      Call MD for:  extreme fatigue    Complete by:  As directed      Call MD for:  persistant dizziness or light-headedness    Complete by:  As directed      Call MD for:  persistant nausea and vomiting    Complete by:  As directed      Call MD for:  temperature >100.4    Complete by:  As directed      Discharge instructions    Complete by:  As directed   Please follow up with your PCP early next week.  You were cared for by a hospitalist during your hospital stay. If you have any questions about your discharge medications or the care you received while you were in the hospital after you are discharged, you can call the unit and asked to speak with the hospitalist on call if the hospitalist that took care of you is not available. Once you are discharged, your primary care physician will handle any further medical issues. Please note that NO REFILLS for any discharge medications will be authorized once  you are discharged, as it is imperative that you return to your primary care physician (or establish a relationship with a primary care physician if you do not have one) for your aftercare needs so that they can reassess your need for medications and monitor your lab values. If you do not have a primary care physician, you can call [phone] for a physician referral.     Increase activity slowly    Complete by:  As directed            ALLERGIES: No Known Allergies   Discharge Medication List as of 02/22/2015 10:21 AM    START taking these medications   Details  ALPRAZolam (XANAX) 0.5 MG tablet Take 1 tablet (0.5 mg total) by mouth 2 (two) times daily as needed for anxiety., Starting 02/22/2015, Until Discontinued, Print    levofloxacin (LEVAQUIN) 500 MG tablet Take  1 tablet (500 mg total) by mouth daily. For 5 more days., Starting 02/22/2015, Until Discontinued, Print    predniSONE (DELTASONE) 20 MG tablet Take 3 tablets once daily for 4 days, then take 2 tablets once daily for 4 days, then take 1 tablet once daily for 4 days, then STOP, Print      CONTINUE these medications which have NOT CHANGED   Details  acetaminophen (TYLENOL) 650 MG CR tablet Take 650 mg by mouth every 8 (eight) hours as needed for pain., Until Discontinued, Historical Med    albuterol (PROVENTIL HFA;VENTOLIN HFA) 108 (90 BASE) MCG/ACT inhaler Inhale 2 puffs into the lungs every 4 (four) hours as needed for wheezing., Starting 03/11/2013, Until Discontinued, Print    albuterol (PROVENTIL) (2.5 MG/3ML) 0.083% nebulizer solution Take 3 mLs (2.5 mg total) by nebulization 4 (four) times daily. DX J44.9, Starting 11/22/2014, Until Discontinued, Normal    allopurinol (ZYLOPRIM) 100 MG tablet Take 100 mg by mouth daily., Until Discontinued, Historical Med    budesonide-formoterol (SYMBICORT) 160-4.5 MCG/ACT inhaler Inhale 2 puffs into the lungs 2 (two) times daily., Until Discontinued, Historical Med    fluticasone  (FLONASE) 50 MCG/ACT nasal spray Place 2 sprays into both nostrils 2 (two) times daily., Starting 01/11/2015, Until Discontinued, Normal    glipiZIDE (GLUCOTROL) 10 MG tablet Take 10 mg by mouth daily before breakfast., Until Discontinued, Historical Med    HYDROcodone-acetaminophen (NORCO) 7.5-325 MG per tablet Take 1 tablet by mouth 3 (three) times daily as needed for moderate pain or severe pain. , Starting 11/23/2014, Until Discontinued, Historical Med    hyoscyamine (ANASPAZ) 0.125 MG TBDP disintergrating tablet Place 0.125 mg under the tongue 4 (four) times daily., Until Discontinued, Historical Med    ipratropium (ATROVENT) 0.02 % nebulizer solution inhale contents of 1 vial in nebulizer four times a day, Normal    loratadine (CLARITIN) 10 MG tablet Take 10 mg by mouth daily., Until Discontinued, Historical Med    pantoprazole (PROTONIX) 40 MG tablet Take 40 mg by mouth daily., Starting 01/19/2015, Until Discontinued, Historical Med    pregabalin (LYRICA) 75 MG capsule Take 75 mg by mouth 2 (two) times daily., Until Discontinued, Historical Med    tiotropium (SPIRIVA) 18 MCG inhalation capsule Place 18 mcg into inhaler and inhale daily., Until Discontinued, Historical Med    triamterene-hydrochlorothiazide (DYAZIDE) 37.5-25 MG per capsule Take 1 capsule by mouth daily., Until Discontinued, Historical Med    zolpidem (AMBIEN) 10 MG tablet Take 10 mg by mouth at bedtime., Until Discontinued, Historical Med      STOP taking these medications     indomethacin (INDOCIN) 25 MG capsule        Follow-up Information    Follow up with GARBA,LAWAL, MD. Schedule an appointment as soon as possible for a visit in 1 week.   Specialty:  Internal Medicine   Why:  post hospitalization follow up   Contact information:   [place] [place] [address] [phone]       TOTAL DISCHARGE TIME: 79 minutes  [facility] Pager [phone]  02/22/2015, 3:31  PM

## 2015-02-22 NOTE — Progress Notes (Signed)
Patient received multiple BD treatment throughout the night per her request. Wheezing present, with diminished sounds throughout all fields. RT treatment assessment protocol completed. Acuity score for TID or QID schedule. However, order changed to every 4 hours around the clock to help alleviate her SOB/dyspnea at night.

## 2015-02-22 NOTE — Discharge Instructions (Signed)

## 2015-02-23 NOTE — ED Provider Notes (Signed)
CSN: [id number]     Arrival date & time 02/17/15  1221 History   First MD Initiated Contact with Patient 02/17/15 1456     Chief Complaint  Patient presents with  . Shortness of Breath  . Chest Pain     (Consider location/radiation/quality/duration/timing/severity/associated sxs/prior Treatment) HPI   59 y.o. year old female with significant past medical history of morbid obesity, OSA, chronic resp failure on 2L [place] chronically, type 2 DM, COPD, tobacco abuse presenting with acute on chronic resp failure w/ hypoxia. Pt reports choking episode 4-5 days ago while trying to drink some water. Since this point, pt reports mild cough and SOB. Has not noticed any significant wheezing apart from today. Reports increased albuterol use. No fevers or chills. No nausea or vomiting. Has been compliant w/ CPAP  Past Medical History  Diagnosis Date  . Hypertension   . Anxiety   . Emphysema   . COPD (chronic obstructive pulmonary disease)   . OSA on CPAP     CPAP at night   . Dizziness     pt believes this is motion sickness or vertigo  . GERD (gastroesophageal reflux disease)   . Headache(784.0)   . Cancer 1990    cervical   . Anemia   . Morbid obesity   . Sickle cell trait   . Hx of cardiac catheterization     a. LHC at [facility] in [place], [place] 09/2008:  Normal coronary arteries EF 70%.  . Tobacco abuse     a. up to 3ppd from age 5 to 71, now 1/4 ppd (01/2013)  . Shortness of breath   . Pneumonia   . Diabetes mellitus without complication    Past Surgical History  Procedure Laterality Date  . Tubal ligation    . Cardiac catheterization    . Colonoscopy  09/05/2012    Procedure: COLONOSCOPY;  Surgeon: Beryle Beams, MD;  Location: [facility];  Service: Endoscopy;  Laterality: N/A;   Family History  Problem Relation Age of Onset  . Lung cancer Paternal Aunt   . Lung cancer Paternal Grandfather   . Other Father     unaware of father's medical history  . Diabetes Mother     alive @  90  . Myasthenia gravis Mother   . Other      multiple siblings a&w.   History  Substance Use Topics  . Smoking status: Current Every Day Smoker -- 0.50 packs/day for 36 years    Types: Cigarettes  . Smokeless tobacco: Never Used     Comment: Approx 90 pk-yrs (up to 3ppd until ~ 2009). Smoking 3 cigs per day now.  . Alcohol Use: No   OB History    No data available     Review of Systems  All systems reviewed and negative, other than as noted in HPI.   Allergies  Review of patient's allergies indicates no known allergies.  Home Medications   Prior to Admission medications   Medication Sig Start Date End Date Taking? Authorizing Provider  acetaminophen (TYLENOL) 650 MG CR tablet Take 650 mg by mouth every 8 (eight) hours as needed for pain.   Yes Historical Provider, MD  albuterol (PROVENTIL HFA;VENTOLIN HFA) 108 (90 BASE) MCG/ACT inhaler Inhale 2 puffs into the lungs every 4 (four) hours as needed for wheezing. 03/11/13  Yes Collene Gobble, MD  albuterol (PROVENTIL) (2.5 MG/3ML) 0.083% nebulizer solution Take 3 mLs (2.5 mg total) by nebulization 4 (four) times daily. DX J44.9  11/22/14  Yes Collene Gobble, MD  allopurinol (ZYLOPRIM) 100 MG tablet Take 100 mg by mouth daily.   Yes Historical Provider, MD  budesonide-formoterol (SYMBICORT) 160-4.5 MCG/ACT inhaler Inhale 2 puffs into the lungs 2 (two) times daily.   Yes Historical Provider, MD  fluticasone (FLONASE) 50 MCG/ACT nasal spray Place 2 sprays into both nostrils 2 (two) times daily. 01/11/15  Yes Collene Gobble, MD  glipiZIDE (GLUCOTROL) 10 MG tablet Take 10 mg by mouth daily before breakfast.   Yes Historical Provider, MD  HYDROcodone-acetaminophen (NORCO) 7.5-325 MG per tablet Take 1 tablet by mouth 3 (three) times daily as needed for moderate pain or severe pain.  11/23/14  Yes Historical Provider, MD  hyoscyamine (ANASPAZ) 0.125 MG TBDP disintergrating tablet Place 0.125 mg under the tongue 4 (four) times daily.   Yes  Historical Provider, MD  ipratropium (ATROVENT) 0.02 % nebulizer solution inhale contents of 1 vial in nebulizer four times a day 01/24/15  Yes Collene Gobble, MD  loratadine (CLARITIN) 10 MG tablet Take 10 mg by mouth daily.   Yes Historical Provider, MD  pantoprazole (PROTONIX) 40 MG tablet Take 40 mg by mouth daily. 01/19/15  Yes Historical Provider, MD  pregabalin (LYRICA) 75 MG capsule Take 75 mg by mouth 2 (two) times daily.   Yes Historical Provider, MD  tiotropium (SPIRIVA) 18 MCG inhalation capsule Place 18 mcg into inhaler and inhale daily.   Yes Historical Provider, MD  triamterene-hydrochlorothiazide (DYAZIDE) 37.5-25 MG per capsule Take 1 capsule by mouth daily.   Yes Historical Provider, MD  zolpidem (AMBIEN) 10 MG tablet Take 10 mg by mouth at bedtime.   Yes Historical Provider, MD  ALPRAZolam Duanne Moron) 0.5 MG tablet Take 1 tablet (0.5 mg total) by mouth 2 (two) times daily as needed for anxiety. 02/22/15   Bonnielee Haff, MD  levofloxacin (LEVAQUIN) 500 MG tablet Take 1 tablet (500 mg total) by mouth daily. For 5 more days. 02/22/15   Bonnielee Haff, MD  predniSONE (DELTASONE) 20 MG tablet Take 3 tablets once daily for 4 days, then take 2 tablets once daily for 4 days, then take 1 tablet once daily for 4 days, then STOP 02/22/15   Bonnielee Haff, MD   BP 138/78 mmHg  Pulse 95  Temp(Src) 97.5 F (36.4 C) (Oral)  Resp 18  Ht 5' (1.524 m)  Wt 210 lb (95.255 kg)  BMI 41.01 kg/m2  SpO2 98%  LMP 06/04/2000 (LMP Unknown) Physical Exam  Constitutional: She appears well-developed and well-nourished. No distress.  HENT:  Head: Normocephalic and atraumatic.  Eyes: Conjunctivae are normal. Right eye exhibits no discharge. Left eye exhibits no discharge.  Neck: Neck supple.  Cardiovascular: Normal rate, regular rhythm and normal heart sounds.  Exam reveals no gallop and no friction rub.   No murmur heard. Pulmonary/Chest: Effort normal. No respiratory distress. She has wheezes.  Poor air  movement  Abdominal: Soft. She exhibits no distension. There is no tenderness.  Musculoskeletal: She exhibits no edema or tenderness.  Neurological: She is alert.  Skin: Skin is warm and dry.  Psychiatric: She has a normal mood and affect. Her behavior is normal. Thought content normal.  Nursing note and vitals reviewed.   ED Course  Procedures (including critical care time) Labs Review Labs Reviewed  CBC - Abnormal; Notable for the following:    Hemoglobin 11.8 (*)    HCT 34.4 (*)    All other components within normal limits  BASIC METABOLIC PANEL - Abnormal; Notable  for the following:    Glucose, Bld 102 (*)    Creatinine, Ser 1.12 (*)    GFR calc non Af Amer 54 (*)    GFR calc Af Amer 62 (*)    All other components within normal limits  COMPREHENSIVE METABOLIC PANEL - Abnormal; Notable for the following:    Glucose, Bld 215 (*)    Creatinine, Ser 1.22 (*)    Alkaline Phosphatase 36 (*)    GFR calc non Af Amer 49 (*)    GFR calc Af Amer 56 (*)    All other components within normal limits  CBC WITH DIFFERENTIAL/PLATELET - Abnormal; Notable for the following:    Hemoglobin 11.4 (*)    HCT 34.2 (*)    Neutrophils Relative % 90 (*)    Lymphocytes Relative 10 (*)    Monocytes Relative 0 (*)    Monocytes Absolute 0.0 (*)    All other components within normal limits  HEMOGLOBIN A1C - Abnormal; Notable for the following:    Hgb A1c MFr Bld 6.9 (*)    All other components within normal limits  GLUCOSE, CAPILLARY - Abnormal; Notable for the following:    Glucose-Capillary 175 (*)    All other components within normal limits  GLUCOSE, CAPILLARY - Abnormal; Notable for the following:    Glucose-Capillary 226 (*)    All other components within normal limits  GLUCOSE, CAPILLARY - Abnormal; Notable for the following:    Glucose-Capillary 193 (*)    All other components within normal limits  GLUCOSE, CAPILLARY - Abnormal; Notable for the following:    Glucose-Capillary 187 (*)     All other components within normal limits  GLUCOSE, CAPILLARY - Abnormal; Notable for the following:    Glucose-Capillary 195 (*)    All other components within normal limits  CBC - Abnormal; Notable for the following:    WBC 12.9 (*)    Hemoglobin 10.8 (*)    HCT 32.6 (*)    All other components within normal limits  BASIC METABOLIC PANEL - Abnormal; Notable for the following:    Glucose, Bld 255 (*)    BUN 25 (*)    GFR calc non Af Amer 56 (*)    GFR calc Af Amer 65 (*)    All other components within normal limits  GLUCOSE, CAPILLARY - Abnormal; Notable for the following:    Glucose-Capillary 217 (*)    All other components within normal limits  GLUCOSE, CAPILLARY - Abnormal; Notable for the following:    Glucose-Capillary 202 (*)    All other components within normal limits  GLUCOSE, CAPILLARY - Abnormal; Notable for the following:    Glucose-Capillary 214 (*)    All other components within normal limits  GLUCOSE, CAPILLARY - Abnormal; Notable for the following:    Glucose-Capillary 152 (*)    All other components within normal limits  GLUCOSE, CAPILLARY - Abnormal; Notable for the following:    Glucose-Capillary 201 (*)    All other components within normal limits  GLUCOSE, CAPILLARY - Abnormal; Notable for the following:    Glucose-Capillary 192 (*)    All other components within normal limits  GLUCOSE, CAPILLARY - Abnormal; Notable for the following:    Glucose-Capillary 182 (*)    All other components within normal limits  GLUCOSE, CAPILLARY - Abnormal; Notable for the following:    Glucose-Capillary 223 (*)    All other components within normal limits  GLUCOSE, CAPILLARY - Abnormal; Notable for the following:  Glucose-Capillary 143 (*)    All other components within normal limits  GLUCOSE, CAPILLARY - Abnormal; Notable for the following:    Glucose-Capillary 159 (*)    All other components within normal limits  GLUCOSE, CAPILLARY - Abnormal; Notable for the  following:    Glucose-Capillary 169 (*)    All other components within normal limits  GLUCOSE, CAPILLARY - Abnormal; Notable for the following:    Glucose-Capillary 174 (*)    All other components within normal limits  BASIC METABOLIC PANEL - Abnormal; Notable for the following:    Glucose, Bld 210 (*)    BUN 28 (*)    Creatinine, Ser 1.25 (*)    GFR calc non Af Amer 47 (*)    GFR calc Af Amer 55 (*)    All other components within normal limits  GLUCOSE, CAPILLARY - Abnormal; Notable for the following:    Glucose-Capillary 182 (*)    All other components within normal limits  GLUCOSE, CAPILLARY - Abnormal; Notable for the following:    Glucose-Capillary 260 (*)    All other components within normal limits  GLUCOSE, CAPILLARY - Abnormal; Notable for the following:    Glucose-Capillary 173 (*)    All other components within normal limits  GLUCOSE, CAPILLARY - Abnormal; Notable for the following:    Glucose-Capillary 157 (*)    All other components within normal limits  GLUCOSE, CAPILLARY - Abnormal; Notable for the following:    Glucose-Capillary 139 (*)    All other components within normal limits  BASIC METABOLIC PANEL - Abnormal; Notable for the following:    Glucose, Bld 149 (*)    BUN 32 (*)    Creatinine, Ser 1.26 (*)    GFR calc non Af Amer 47 (*)    GFR calc Af Amer 54 (*)    All other components within normal limits  GLUCOSE, CAPILLARY - Abnormal; Notable for the following:    Glucose-Capillary 171 (*)    All other components within normal limits  GLUCOSE, CAPILLARY - Abnormal; Notable for the following:    Glucose-Capillary 184 (*)    All other components within normal limits  CBG MONITORING, ED - Abnormal; Notable for the following:    Glucose-Capillary 134 (*)    All other components within normal limits  TROPONIN I  I-STAT TROPOININ, ED    Imaging Review No results found.   Dg Chest 2 View (if Patient Has Fever And/or Copd)  02/17/2015   CLINICAL DATA:   Neck swelling that hinders breathing, severe shortness of breath, chest pain, history COPD, GERD, sickle trait, smoker, diabetes  EXAM: CHEST  2 VIEW  COMPARISON:  01/10/2015  FINDINGS: Normal heart size, mediastinal contours and pulmonary vascularity.  Atherosclerotic calcification aorta.  Lungs minimally hyperinflated clear.  No infiltrate, pleural effusion or pneumothorax.  Scattered endplate spur formation thoracic spine with diffuse idiopathic skeletal hyperostosis.  IMPRESSION: Hyperinflation question COPD.  No acute infiltrate.   Electronically Signed   By: Lavonia Dana M.D.   On: 02/17/2015 14:54     EKG Interpretation   Date/Time:  Thursday February 17 2015 12:37:00 EDT Ventricular Rate:  97 PR Interval:  137 QRS Duration: 83 QT Interval:  395 QTC Calculation: 502 R Axis:   60 Text Interpretation:  Sinus rhythm Borderline prolonged QT interval  Confirmed by Wilson Singer  MD, Zohal Reny ([id number]) on 02/17/2015 2:58:10 PM      MDM   Final diagnoses:  Acute respiratory failure with hypoxia    58yF  with sob. Clinically copd exacerbation. Increased oxygen requirement despite prolonged neb. Will admit.     Virgel Manifold, MD 02/23/15 1201

## 2015-02-26 ENCOUNTER — Emergency Department (HOSPITAL_COMMUNITY): Payer: Medicaid Other

## 2015-02-26 ENCOUNTER — Emergency Department (HOSPITAL_COMMUNITY)
Admission: EM | Admit: 2015-02-26 | Discharge: 2015-02-26 | Disposition: A | Discharge status: Home / Self Care | Payer: Medicaid Other | Attending: Emergency Medicine | Admitting: Emergency Medicine

## 2015-02-26 ENCOUNTER — Encounter (HOSPITAL_COMMUNITY): Payer: Self-pay | Admitting: *Deleted

## 2015-02-26 DIAGNOSIS — R059 Cough, unspecified: Secondary | ICD-10-CM

## 2015-02-26 DIAGNOSIS — Z79899 Other long term (current) drug therapy: Secondary | ICD-10-CM | POA: Diagnosis not present

## 2015-02-26 DIAGNOSIS — Z8701 Personal history of pneumonia (recurrent): Secondary | ICD-10-CM | POA: Insufficient documentation

## 2015-02-26 DIAGNOSIS — Z9981 Dependence on supplemental oxygen: Secondary | ICD-10-CM | POA: Insufficient documentation

## 2015-02-26 DIAGNOSIS — F419 Anxiety disorder, unspecified: Secondary | ICD-10-CM | POA: Insufficient documentation

## 2015-02-26 DIAGNOSIS — M940 Chondrocostal junction syndrome [Tietze]: Secondary | ICD-10-CM | POA: Insufficient documentation

## 2015-02-26 DIAGNOSIS — Z720 Tobacco use: Secondary | ICD-10-CM | POA: Diagnosis not present

## 2015-02-26 DIAGNOSIS — Z7951 Long term (current) use of inhaled steroids: Secondary | ICD-10-CM | POA: Insufficient documentation

## 2015-02-26 DIAGNOSIS — Z8669 Personal history of other diseases of the nervous system and sense organs: Secondary | ICD-10-CM | POA: Insufficient documentation

## 2015-02-26 DIAGNOSIS — Z9889 Other specified postprocedural states: Secondary | ICD-10-CM | POA: Insufficient documentation

## 2015-02-26 DIAGNOSIS — Z8541 Personal history of malignant neoplasm of cervix uteri: Secondary | ICD-10-CM | POA: Insufficient documentation

## 2015-02-26 DIAGNOSIS — I10 Essential (primary) hypertension: Secondary | ICD-10-CM | POA: Insufficient documentation

## 2015-02-26 DIAGNOSIS — E119 Type 2 diabetes mellitus without complications: Secondary | ICD-10-CM | POA: Insufficient documentation

## 2015-02-26 DIAGNOSIS — Z862 Personal history of diseases of the blood and blood-forming organs and certain disorders involving the immune mechanism: Secondary | ICD-10-CM | POA: Insufficient documentation

## 2015-02-26 DIAGNOSIS — K219 Gastro-esophageal reflux disease without esophagitis: Secondary | ICD-10-CM | POA: Diagnosis not present

## 2015-02-26 DIAGNOSIS — R1012 Left upper quadrant pain: Secondary | ICD-10-CM | POA: Diagnosis present

## 2015-02-26 DIAGNOSIS — R05 Cough: Secondary | ICD-10-CM

## 2015-02-26 DIAGNOSIS — J441 Chronic obstructive pulmonary disease with (acute) exacerbation: Secondary | ICD-10-CM | POA: Insufficient documentation

## 2015-02-26 LAB — CBC WITH DIFFERENTIAL/PLATELET
BASOS ABS: 0 10*3/uL (ref 0.0–0.1)
Basophils Relative: 0 % (ref 0–1)
EOS ABS: 0.2 10*3/uL (ref 0.0–0.7)
Eosinophils Relative: 1 % (ref 0–5)
HCT: 33.7 % — ABNORMAL LOW (ref 36.0–46.0)
HEMOGLOBIN: 11.6 g/dL — AB (ref 12.0–15.0)
LYMPHS ABS: 4.4 10*3/uL — AB (ref 0.7–4.0)
LYMPHS PCT: 25 % (ref 12–46)
MCH: 28.7 pg (ref 26.0–34.0)
MCHC: 34.4 g/dL (ref 30.0–36.0)
MCV: 83.4 fL (ref 78.0–100.0)
MONOS PCT: 6 % (ref 3–12)
Monocytes Absolute: 1.1 10*3/uL — ABNORMAL HIGH (ref 0.1–1.0)
NEUTROS ABS: 12.3 10*3/uL — AB (ref 1.7–7.7)
Neutrophils Relative %: 68 % (ref 43–77)
Platelets: 225 10*3/uL (ref 150–400)
RBC: 4.04 MIL/uL (ref 3.87–5.11)
RDW: 15.8 % — ABNORMAL HIGH (ref 11.5–15.5)
WBC: 18 10*3/uL — ABNORMAL HIGH (ref 4.0–10.5)

## 2015-02-26 LAB — COMPREHENSIVE METABOLIC PANEL
ALT: 128 U/L — ABNORMAL HIGH (ref 0–35)
AST: 44 U/L — AB (ref 0–37)
Albumin: 3.8 g/dL (ref 3.5–5.2)
Alkaline Phosphatase: 48 U/L (ref 39–117)
Anion gap: 9 (ref 5–15)
BILIRUBIN TOTAL: 0.4 mg/dL (ref 0.3–1.2)
BUN: 28 mg/dL — AB (ref 6–23)
CALCIUM: 8.8 mg/dL (ref 8.4–10.5)
CO2: 31 mmol/L (ref 19–32)
Chloride: 99 mmol/L (ref 96–112)
Creatinine, Ser: 0.98 mg/dL (ref 0.50–1.10)
GFR calc non Af Amer: 63 mL/min — ABNORMAL LOW (ref >90)
GFR, EST AFRICAN AMERICAN: 73 mL/min — AB (ref >90)
Glucose, Bld: 142 mg/dL — ABNORMAL HIGH (ref 70–99)
Potassium: 3.9 mmol/L (ref 3.5–5.1)
Sodium: 139 mmol/L (ref 135–145)
Total Protein: 6.5 g/dL (ref 6.0–8.3)

## 2015-02-26 LAB — BLOOD GAS, VENOUS
Acid-Base Excess: 5.3 mmol/L — ABNORMAL HIGH (ref 0.0–2.0)
BICARBONATE: 29.7 meq/L — AB (ref 20.0–24.0)
O2 Content: 3 L/min
O2 Saturation: 98 %
Patient temperature: 98.3
TCO2: 26.7 mmol/L (ref 0–100)
pCO2, Ven: 44.5 mmHg — ABNORMAL LOW (ref 45.0–50.0)
pH, Ven: 7.44 — ABNORMAL HIGH (ref 7.250–7.300)
pO2, Ven: 136 mmHg — ABNORMAL HIGH (ref 30.0–45.0)

## 2015-02-26 LAB — LIPASE, BLOOD: LIPASE: 17 U/L (ref 11–59)

## 2015-02-26 LAB — BRAIN NATRIURETIC PEPTIDE: B Natriuretic Peptide: 23.9 pg/mL (ref 0.0–100.0)

## 2015-02-26 MED ORDER — HYDROCOD POLST-CPM POLST ER 10-8 MG/5ML PO SUER: 5.0000 mL | Freq: Two times a day (BID) | ORAL | Status: DC | PRN

## 2015-02-26 MED ORDER — HYDROCOD POLST-CPM POLST ER 10-8 MG/5ML PO SUER
5.0000 mL | ORAL | Status: AC
  Administered 2015-02-26: 5 mL via ORAL
  Filled 2015-02-26: qty 5

## 2015-02-26 NOTE — ED Notes (Signed)
Patient transported to X-ray 

## 2015-02-26 NOTE — ED Notes (Signed)
Patient refusing to have ISTAT drawn

## 2015-02-26 NOTE — ED Provider Notes (Signed)
CSN: [id number]     Arrival date & time 02/26/15  9892 History   First MD Initiated Contact with Patient 02/26/15 0340     Chief Complaint  Patient presents with  . Abdominal Pain     (Consider location/radiation/quality/duration/timing/severity/associated sxs/prior Treatment) HPI Comments: 59 year old morbidly obese, chronically ill female with a history of COPD who was recently hospitalized for an exacerbation and discharged 5 days ago.  Reports last night she was awakened from sleep with increased coughing and severe, sharp, tearing of left chest pain with cough.  Denies any fever, nausea, vomiting, diarrhea, diaphoresis, chest pain  Patient is a 59 y.o. female presenting with abdominal pain. The history is provided by the patient.  Abdominal Pain Pain location:  LUQ Pain quality: aching   Pain radiates to:  Does not radiate Pain severity:  Moderate Onset quality:  Sudden Progression:  Worsening Chronicity:  Recurrent Context: awakening from sleep and recent illness   Relieved by:  Nothing Worsened by:  Coughing Ineffective treatments: routine medications. Associated symptoms: cough and shortness of breath   Associated symptoms: no chest pain, no fever, no nausea and no vomiting   Risk factors: obesity and recent hospitalization     Past Medical History  Diagnosis Date  . Hypertension   . Anxiety   . Emphysema   . COPD (chronic obstructive pulmonary disease)   . OSA on CPAP     CPAP at night   . Dizziness     pt believes this is motion sickness or vertigo  . GERD (gastroesophageal reflux disease)   . Headache(784.0)   . Cancer 1990    cervical   . Anemia   . Morbid obesity   . Sickle cell trait   . Hx of cardiac catheterization     a. LHC at [facility] in [place], [place] 09/2008:  Normal coronary arteries EF 70%.  . Tobacco abuse     a. up to 3ppd from age 89 to 79, now 1/4 ppd (01/2013)  . Shortness of breath   . Pneumonia   . Diabetes mellitus without complication     Past Surgical History  Procedure Laterality Date  . Tubal ligation    . Cardiac catheterization    . Colonoscopy  09/05/2012    Procedure: COLONOSCOPY;  Surgeon: Beryle Beams, MD;  Location: [facility];  Service: Endoscopy;  Laterality: N/A;   Family History  Problem Relation Age of Onset  . Lung cancer Paternal Aunt   . Lung cancer Paternal Grandfather   . Other Father     unaware of father's medical history  . Diabetes Mother     alive @ 56  . Myasthenia gravis Mother   . Other      multiple siblings a&w.   History  Substance Use Topics  . Smoking status: Current Every Day Smoker -- 0.50 packs/day for 36 years    Types: Cigarettes  . Smokeless tobacco: Never Used     Comment: Approx 90 pk-yrs (up to 3ppd until ~ 2009). Smoking 3 cigs per day now.  . Alcohol Use: No   OB History    No data available     Review of Systems  Constitutional: Negative for fever.  Respiratory: Positive for cough, shortness of breath and wheezing.   Cardiovascular: Negative for chest pain and leg swelling.  Gastrointestinal: Positive for abdominal pain. Negative for nausea and vomiting.      Allergies  Review of patient's allergies indicates no known allergies.  Home Medications  Prior to Admission medications   Medication Sig Start Date End Date Taking? Authorizing Provider  acetaminophen (TYLENOL) 650 MG CR tablet Take 650 mg by mouth every 8 (eight) hours as needed for pain.   Yes Historical Provider, MD  albuterol (PROVENTIL) (2.5 MG/3ML) 0.083% nebulizer solution Take 3 mLs (2.5 mg total) by nebulization 4 (four) times daily. DX J44.9 11/22/14  Yes Collene Gobble, MD  allopurinol (ZYLOPRIM) 100 MG tablet Take 100 mg by mouth daily.   Yes Historical Provider, MD  ALPRAZolam Duanne Moron) 0.5 MG tablet Take 1 tablet (0.5 mg total) by mouth 2 (two) times daily as needed for anxiety. 02/22/15  Yes Bonnielee Haff, MD  budesonide-formoterol [facility]) 160-4.5 MCG/ACT inhaler Inhale 2  puffs into the lungs 2 (two) times daily.   Yes Historical Provider, MD  fluticasone (FLONASE) 50 MCG/ACT nasal spray Place 2 sprays into both nostrils 2 (two) times daily. 01/11/15  Yes Collene Gobble, MD  glipiZIDE (GLUCOTROL) 10 MG tablet Take 10 mg by mouth daily before breakfast.   Yes Historical Provider, MD  HYDROcodone-acetaminophen (NORCO) 7.5-325 MG per tablet Take 1 tablet by mouth 3 (three) times daily as needed for moderate pain or severe pain.  11/23/14  Yes Historical Provider, MD  hyoscyamine (ANASPAZ) 0.125 MG TBDP disintergrating tablet Place 0.125 mg under the tongue 4 (four) times daily.   Yes Historical Provider, MD  ipratropium (ATROVENT) 0.02 % nebulizer solution inhale contents of 1 vial in nebulizer four times a day 01/24/15  Yes Collene Gobble, MD  levofloxacin (LEVAQUIN) 500 MG tablet Take 1 tablet (500 mg total) by mouth daily. For 5 more days. 02/22/15  Yes Bonnielee Haff, MD  loratadine (CLARITIN) 10 MG tablet Take 10 mg by mouth daily.   Yes Historical Provider, MD  pantoprazole (PROTONIX) 40 MG tablet Take 40 mg by mouth daily. 01/19/15  Yes Historical Provider, MD  predniSONE (DELTASONE) 20 MG tablet Take 3 tablets once daily for 4 days, then take 2 tablets once daily for 4 days, then take 1 tablet once daily for 4 days, then STOP 02/22/15  Yes Bonnielee Haff, MD  pregabalin (LYRICA) 75 MG capsule Take 75 mg by mouth 2 (two) times daily.   Yes Historical Provider, MD  tiotropium (SPIRIVA) 18 MCG inhalation capsule Place 18 mcg into inhaler and inhale daily.   Yes Historical Provider, MD  triamterene-hydrochlorothiazide (DYAZIDE) 37.5-25 MG per capsule Take 1 capsule by mouth daily.   Yes Historical Provider, MD  zolpidem (AMBIEN) 10 MG tablet Take 10 mg by mouth at bedtime.   Yes Historical Provider, MD  albuterol (PROVENTIL HFA;VENTOLIN HFA) 108 (90 BASE) MCG/ACT inhaler Inhale 2 puffs into the lungs every 4 (four) hours as needed for wheezing. 03/11/13   Collene Gobble, MD   chlorpheniramine-HYDROcodone (TUSSIONEX) 10-8 MG/5ML SUER Take 5 mLs by mouth every 12 (twelve) hours as needed for cough. 02/26/15   Junius Creamer, NP   BP 123/82 mmHg  Pulse 94  Temp(Src) 98.3 F (36.8 C) (Oral)  Resp 18  SpO2 98%  LMP 06/04/2000 (LMP Unknown) Physical Exam  Constitutional: She is oriented to person, place, and time. She appears well-developed and well-nourished.  Eyes: Pupils are equal, round, and reactive to light.  Neck: Normal range of motion.  Cardiovascular: Normal rate and regular rhythm.   Pulmonary/Chest: No respiratory distress. She has wheezes. She exhibits tenderness.  Abdominal: Soft. She exhibits no distension. There is tenderness in the left upper quadrant. There is no rebound.  Musculoskeletal: Normal range of motion.  Neurological: She is alert and oriented to person, place, and time.  Skin: Skin is warm and dry.  Nursing note and vitals reviewed.   ED Course  Procedures (including critical care time) Labs Review Labs Reviewed  CBC WITH DIFFERENTIAL/PLATELET - Abnormal; Notable for the following:    WBC 18.0 (*)    Hemoglobin 11.6 (*)    HCT 33.7 (*)    RDW 15.8 (*)    Neutro Abs 12.3 (*)    Lymphs Abs 4.4 (*)    Monocytes Absolute 1.1 (*)    All other components within normal limits  COMPREHENSIVE METABOLIC PANEL - Abnormal; Notable for the following:    Glucose, Bld 142 (*)    BUN 28 (*)    AST 44 (*)    ALT 128 (*)    GFR calc non Af Amer 63 (*)    GFR calc Af Amer 73 (*)    All other components within normal limits  BLOOD GAS, VENOUS - Abnormal; Notable for the following:    pH, Ven 7.440 (*)    pCO2, Ven 44.5 (*)    pO2, Ven 136.0 (*)    Bicarbonate 29.7 (*)    Acid-Base Excess 5.3 (*)    All other components within normal limits  LIPASE, BLOOD  BRAIN NATRIURETIC PEPTIDE  I-STAT TROPOININ, ED    Imaging Review Dg Chest 2 View  02/26/2015   CLINICAL DATA:  Hypertension and COPD.  Cough.  EXAM: CHEST  2 VIEW   COMPARISON:  02/17/2015  FINDINGS: Cardiomegaly and mild pulmonary venous congestion. No pneumonia or edema. No effusion or pneumothorax.  Aortic and hilar contours are stable.  No acute osseous findings.  IMPRESSION: Cardiomegaly and mild venous congestion.   Electronically Signed   By: Monte Fantasia M.D.   On: 02/26/2015 04:29     EKG Interpretation None      MDM   Final diagnoses:  Cough  Costochondritis         Junius Creamer, NP 02/26/15 [place], MD 02/26/15 [phone]

## 2015-02-26 NOTE — Discharge Instructions (Signed)
Make sure you keep your appointment as scheduled on Tuesday when you feel cough coming on.  Try to splint the area to minimize the movement of your ribs.  He been given a cough medication that should help decrease the intensity and frequency, or fever.  Coughing

## 2015-02-26 NOTE — ED Notes (Signed)
Bed: RW11 Expected date:  Expected time:  Means of arrival:  Comments: 78F Abd pain Lflank pain

## 2015-02-26 NOTE — ED Notes (Signed)
RT made aware of VBG

## 2015-02-26 NOTE — ED Notes (Signed)
Patient intermittently yelling out, stating that she is in pain Patient eating Cheetos upon entering room Patient in NAD Dr. Colin Rhein at bedside

## 2015-02-26 NOTE — ED Notes (Signed)
Patient with c/o LUQ pain Patient with hx of gall stones Patient also with cough Patient seen and DC recently r/t COPD Patient unsure if pain is from gall stones, coughing r/t COPD or constipation for which she is currently taking Miralax Patient took Hydrocodone at 39

## 2015-02-26 NOTE — ED Notes (Addendum)
Patient up for DC and states that her daughter will here shortly to take her home Patient did not bring her O2 tank with her to the ED and her daughter has left already to come get patient Patient states, "I don't want her to have to turn around. I am ready to go!" Patient informed of risks and consequences to leaving ED without O2  Patient continued to state that she didn't want her daughter to go back to the house to get her O2 tank Patient stated to this nurse, "I am going to chance it. I have been in the hospital since Tuesday and I am just ready to go home." Will inform EDP and ED Charge nurse

## 2015-03-04 ENCOUNTER — Ambulatory Visit
Admission: RE | Admit: 2015-03-04 | Discharge: 2015-03-04 | Disposition: A | Discharge status: Home / Self Care | Payer: Medicaid Other | Source: Ambulatory Visit | Attending: Internal Medicine | Admitting: Internal Medicine

## 2015-03-04 ENCOUNTER — Other Ambulatory Visit: Payer: Self-pay | Admitting: Internal Medicine

## 2015-03-04 DIAGNOSIS — J438 Other emphysema: Secondary | ICD-10-CM

## 2015-03-06 ENCOUNTER — Emergency Department (HOSPITAL_COMMUNITY): Payer: Medicaid Other

## 2015-03-06 ENCOUNTER — Encounter (HOSPITAL_COMMUNITY): Payer: Self-pay | Admitting: Emergency Medicine

## 2015-03-06 ENCOUNTER — Emergency Department (HOSPITAL_COMMUNITY)
Admission: EM | Admit: 2015-03-06 | Discharge: 2015-03-06 | Disposition: A | Discharge status: Home / Self Care | Payer: Medicaid Other | Attending: Emergency Medicine | Admitting: Emergency Medicine

## 2015-03-06 DIAGNOSIS — Z9981 Dependence on supplemental oxygen: Secondary | ICD-10-CM | POA: Insufficient documentation

## 2015-03-06 DIAGNOSIS — Z8701 Personal history of pneumonia (recurrent): Secondary | ICD-10-CM | POA: Insufficient documentation

## 2015-03-06 DIAGNOSIS — K219 Gastro-esophageal reflux disease without esophagitis: Secondary | ICD-10-CM | POA: Insufficient documentation

## 2015-03-06 DIAGNOSIS — Z8541 Personal history of malignant neoplasm of cervix uteri: Secondary | ICD-10-CM | POA: Insufficient documentation

## 2015-03-06 DIAGNOSIS — E119 Type 2 diabetes mellitus without complications: Secondary | ICD-10-CM | POA: Diagnosis not present

## 2015-03-06 DIAGNOSIS — R079 Chest pain, unspecified: Secondary | ICD-10-CM

## 2015-03-06 DIAGNOSIS — Z79899 Other long term (current) drug therapy: Secondary | ICD-10-CM | POA: Insufficient documentation

## 2015-03-06 DIAGNOSIS — Z9889 Other specified postprocedural states: Secondary | ICD-10-CM | POA: Diagnosis not present

## 2015-03-06 DIAGNOSIS — Z8669 Personal history of other diseases of the nervous system and sense organs: Secondary | ICD-10-CM | POA: Diagnosis not present

## 2015-03-06 DIAGNOSIS — J441 Chronic obstructive pulmonary disease with (acute) exacerbation: Secondary | ICD-10-CM | POA: Insufficient documentation

## 2015-03-06 DIAGNOSIS — Z7951 Long term (current) use of inhaled steroids: Secondary | ICD-10-CM | POA: Insufficient documentation

## 2015-03-06 DIAGNOSIS — R740 Nonspecific elevation of levels of transaminase and lactic acid dehydrogenase [LDH]: Secondary | ICD-10-CM | POA: Diagnosis not present

## 2015-03-06 DIAGNOSIS — R0602 Shortness of breath: Secondary | ICD-10-CM | POA: Diagnosis present

## 2015-03-06 DIAGNOSIS — I10 Essential (primary) hypertension: Secondary | ICD-10-CM | POA: Diagnosis not present

## 2015-03-06 DIAGNOSIS — F419 Anxiety disorder, unspecified: Secondary | ICD-10-CM | POA: Insufficient documentation

## 2015-03-06 DIAGNOSIS — R0789 Other chest pain: Secondary | ICD-10-CM

## 2015-03-06 DIAGNOSIS — R7401 Elevation of levels of liver transaminase levels: Secondary | ICD-10-CM

## 2015-03-06 DIAGNOSIS — Z720 Tobacco use: Secondary | ICD-10-CM | POA: Diagnosis not present

## 2015-03-06 LAB — COMPREHENSIVE METABOLIC PANEL
ALK PHOS: 55 U/L (ref 39–117)
ALT: 240 U/L — AB (ref 0–35)
AST: 80 U/L — ABNORMAL HIGH (ref 0–37)
Albumin: 4.2 g/dL (ref 3.5–5.2)
Anion gap: 12 (ref 5–15)
BUN: 21 mg/dL (ref 6–23)
CHLORIDE: 100 mmol/L (ref 96–112)
CO2: 28 mmol/L (ref 19–32)
Calcium: 9.8 mg/dL (ref 8.4–10.5)
Creatinine, Ser: 0.94 mg/dL (ref 0.50–1.10)
GFR calc Af Amer: 77 mL/min — ABNORMAL LOW (ref >90)
GFR calc non Af Amer: 67 mL/min — ABNORMAL LOW (ref >90)
GLUCOSE: 79 mg/dL (ref 70–99)
Potassium: 4.3 mmol/L (ref 3.5–5.1)
Sodium: 140 mmol/L (ref 135–145)
Total Bilirubin: 0.6 mg/dL (ref 0.3–1.2)
Total Protein: 7.7 g/dL (ref 6.0–8.3)

## 2015-03-06 LAB — CBC WITH DIFFERENTIAL/PLATELET
Basophils Absolute: 0 10*3/uL (ref 0.0–0.1)
Basophils Relative: 0 % (ref 0–1)
Eosinophils Absolute: 0.2 10*3/uL (ref 0.0–0.7)
Eosinophils Relative: 2 % (ref 0–5)
HCT: 37.2 % (ref 36.0–46.0)
Hemoglobin: 12.5 g/dL (ref 12.0–15.0)
LYMPHS PCT: 21 % (ref 12–46)
Lymphs Abs: 2.1 10*3/uL (ref 0.7–4.0)
MCH: 28.5 pg (ref 26.0–34.0)
MCHC: 33.6 g/dL (ref 30.0–36.0)
MCV: 84.9 fL (ref 78.0–100.0)
MONO ABS: 0.5 10*3/uL (ref 0.1–1.0)
Monocytes Relative: 5 % (ref 3–12)
NEUTROS PCT: 72 % (ref 43–77)
Neutro Abs: 7 10*3/uL (ref 1.7–7.7)
PLATELETS: 153 10*3/uL (ref 150–400)
RBC: 4.38 MIL/uL (ref 3.87–5.11)
RDW: 16.3 % — ABNORMAL HIGH (ref 11.5–15.5)
WBC: 9.9 10*3/uL (ref 4.0–10.5)

## 2015-03-06 LAB — POC OCCULT BLOOD, ED: Fecal Occult Bld: NEGATIVE

## 2015-03-06 LAB — LIPASE, BLOOD: LIPASE: 15 U/L (ref 11–59)

## 2015-03-06 LAB — D-DIMER, QUANTITATIVE (NOT AT ARMC): D DIMER QUANT: 2.4 ug{FEU}/mL — AB (ref 0.00–0.48)

## 2015-03-06 LAB — BRAIN NATRIURETIC PEPTIDE: B Natriuretic Peptide: 4.9 pg/mL (ref 0.0–100.0)

## 2015-03-06 LAB — I-STAT TROPONIN, ED
Troponin i, poc: 0 ng/mL (ref 0.00–0.08)
Troponin i, poc: 0 ng/mL (ref 0.00–0.08)

## 2015-03-06 MED ORDER — SODIUM CHLORIDE 0.9 % IV SOLN
INTRAVENOUS | Status: DC
  Administered 2015-03-06: 16:00:00 via INTRAVENOUS

## 2015-03-06 MED ORDER — KETOROLAC TROMETHAMINE 30 MG/ML IJ SOLN
15.0000 mg | Freq: Once | INTRAMUSCULAR | Status: AC
  Administered 2015-03-06: 15 mg via INTRAVENOUS
  Filled 2015-03-06: qty 1

## 2015-03-06 MED ORDER — MORPHINE SULFATE 4 MG/ML IJ SOLN
4.0000 mg | Freq: Once | INTRAMUSCULAR | Status: AC
  Administered 2015-03-06: 4 mg via INTRAVENOUS
  Filled 2015-03-06: qty 1

## 2015-03-06 MED ORDER — IOHEXOL 350 MG/ML SOLN
100.0000 mL | Freq: Once | INTRAVENOUS | Status: AC | PRN
  Administered 2015-03-06: 100 mL via INTRAVENOUS

## 2015-03-06 MED ORDER — OXYCODONE-ACETAMINOPHEN 5-325 MG PO TABS: ORAL_TABLET | ORAL | Status: DC

## 2015-03-06 MED ORDER — OXYCODONE HCL 5 MG PO TABS: 5.0000 mg | ORAL_TABLET | ORAL | Status: DC | PRN

## 2015-03-06 NOTE — Discharge Instructions (Signed)
Your liver function tests are elevated today. He must not take any medication that contains acetaminophen (Tylenol). Also do not drink any alcohol. Please let your primary care physician know about this. They will need to recheck your liver tests in the next few weeks.  For pain control please take Ibuprofen (also known as Motrin or Advil) 400mg  (this is normally 2 over the counter pills) every 6 hours. Take with food to minimize stomach irritation.  Take oxycodone for breakthrough pain, do not drink alcohol, drive, care for children or do other critical tasks while taking oxycodone.  Please follow with your primary care doctor in the next 2 days for a check-up. They must obtain records for further management.   Do not hesitate to return to the Emergency Department for any new, worsening or concerning symptoms.    Chest Wall Pain Chest wall pain is pain in or around the bones and muscles of your chest. It may take up to 6 weeks to get better. It may take longer if you must stay physically active in your work and activities.  CAUSES  Chest wall pain may happen on its own. However, it may be caused by:  A viral illness like the flu.  Injury.  Coughing.  Exercise.  Arthritis.  Fibromyalgia.  Shingles. HOME CARE INSTRUCTIONS   Avoid overtiring physical activity. Try not to strain or perform activities that cause pain. This includes any activities using your chest or your abdominal and side muscles, especially if heavy weights are used.  Put ice on the sore area.  Put ice in a plastic bag.  Place a towel between your skin and the bag.  Leave the ice on for 15-20 minutes per hour while awake for the first 2 days.  Only take over-the-counter or prescription medicines for pain, discomfort, or fever as directed by your caregiver. SEEK IMMEDIATE MEDICAL CARE IF:   Your pain increases, or you are very uncomfortable.  You have a fever.  Your chest pain becomes worse.  You have  new, unexplained symptoms.  You have nausea or vomiting.  You feel sweaty or lightheaded.  You have a cough with phlegm (sputum), or you cough up blood. MAKE SURE YOU:   Understand these instructions.  Will watch your condition.  Will get help right away if you are not doing well or get worse. Document Released: 10/29/2005 Document Revised: 01/21/2012 Document Reviewed: 06/25/2011 [facility] Patient Information 2015 [place], [place]. This information is not intended to replace advice given to you by your health care provider. Make sure you discuss any questions you have with your health care provider.

## 2015-03-06 NOTE — ED Notes (Signed)
Pt from home c/o increased SOB x2 days and LUQ pain. Pt adds that she has diarrhea x4 days but has been taking abx. Pt is on 2 L O2 for hx of COPD and emphysema. Pt is A&O and in NAD

## 2015-03-06 NOTE — ED Notes (Signed)
Patient transported to CT 

## 2015-03-06 NOTE — ED Notes (Addendum)
Nurse starting IV 

## 2015-03-06 NOTE — ED Provider Notes (Signed)
CSN: [id number]     Arrival date & time 03/06/15  1320 History   None    Chief Complaint  Patient presents with  . Shortness of Breath  . Abdominal Pain     (Consider location/radiation/quality/duration/timing/severity/associated sxs/prior Treatment) HPI   Stephanie Schultz is a 59 y.o. female with past medical history significant for COPD, obstructive sleep apnea, morbid obesity, tobacco abuse complaining of 10 out of 10 burning pain underneath the left breast worsening over the last 13 days. Patient has been taking Vicodin at home with some relief but she ran out last night. States that she's had loose stool which has been darker than normal she denies melena or hematochezia. Is not taking any blood thinners. Never had pain like this before. She denies shortness of breath above her baseline. Patient does have productive cough. States that the pain is not exacerbated by deep breathing. She denies fever, chills, nausea, vomiting, decreased by mouth intake. Recently finished course of antibiotics for COPD exacerbation.  Past Medical History  Diagnosis Date  . Hypertension   . Anxiety   . Emphysema   . COPD (chronic obstructive pulmonary disease)   . OSA on CPAP     CPAP at night   . Dizziness     pt believes this is motion sickness or vertigo  . GERD (gastroesophageal reflux disease)   . Headache(784.0)   . Cancer 1990    cervical   . Anemia   . Morbid obesity   . Sickle cell trait   . Hx of cardiac catheterization     a. LHC at [facility] in [place], [place] 09/2008:  Normal coronary arteries EF 70%.  . Tobacco abuse     a. up to 3ppd from age 45 to 87, now 1/4 ppd (01/2013)  . Shortness of breath   . Pneumonia   . Diabetes mellitus without complication    Past Surgical History  Procedure Laterality Date  . Tubal ligation    . Cardiac catheterization    . Colonoscopy  09/05/2012    Procedure: COLONOSCOPY;  Surgeon: Beryle Beams, MD;  Location: [facility] ENDOSCOPY;  Service: Endoscopy;   Laterality: N/A;   Family History  Problem Relation Age of Onset  . Lung cancer Paternal Aunt   . Lung cancer Paternal Grandfather   . Other Father     unaware of father's medical history  . Diabetes Mother     alive @ 8  . Myasthenia gravis Mother   . Other      multiple siblings a&w.   History  Substance Use Topics  . Smoking status: Current Every Day Smoker -- 0.50 packs/day for 36 years    Types: Cigarettes  . Smokeless tobacco: Never Used     Comment: Approx 90 pk-yrs (up to 3ppd until ~ 2009). Smoking 3 cigs per day now.  . Alcohol Use: No   OB History    No data available     Review of Systems  10 systems reviewed and found to be negative, except as noted in the HPI.   Allergies  Review of patient's allergies indicates no known allergies.  Home Medications   Prior to Admission medications   Medication Sig Start Date End Date Taking? Authorizing Provider  acetaminophen (TYLENOL) 500 MG tablet Take 1,500 mg by mouth every 6 (six) hours as needed for mild pain or headache.   Yes Historical Provider, MD  acetaminophen (TYLENOL) 650 MG CR tablet Take 650 mg by mouth every 8 (eight)  hours as needed for pain.   Yes Historical Provider, MD  albuterol (PROVENTIL HFA;VENTOLIN HFA) 108 (90 BASE) MCG/ACT inhaler Inhale 2 puffs into the lungs every 4 (four) hours as needed for wheezing. 03/11/13  Yes Collene Gobble, MD  albuterol (PROVENTIL) (2.5 MG/3ML) 0.083% nebulizer solution Take 3 mLs (2.5 mg total) by nebulization 4 (four) times daily. DX J44.9 11/22/14  Yes Collene Gobble, MD  allopurinol (ZYLOPRIM) 100 MG tablet Take 100 mg by mouth daily.   Yes Historical Provider, MD  ALPRAZolam Duanne Moron) 0.5 MG tablet Take 1 tablet (0.5 mg total) by mouth 2 (two) times daily as needed for anxiety. 02/22/15  Yes Bonnielee Haff, MD  budesonide-formoterol [facility]) 160-4.5 MCG/ACT inhaler Inhale 2 puffs into the lungs 2 (two) times daily.   Yes Historical Provider, MD    dextromethorphan-guaiFENesin (ROBITUSSIN-DM) 10-100 MG/5ML liquid Take 10 mLs by mouth every 4 (four) hours as needed for cough.   Yes Historical Provider, MD  fluticasone (FLONASE) 50 MCG/ACT nasal spray Place 2 sprays into both nostrils 2 (two) times daily. 01/11/15  Yes Collene Gobble, MD  glipiZIDE (GLUCOTROL) 10 MG tablet Take 10 mg by mouth daily before breakfast.   Yes Historical Provider, MD  HYDROcodone-acetaminophen (NORCO) 7.5-325 MG per tablet Take 1 tablet by mouth every 4 (four) hours as needed for moderate pain or severe pain.  11/23/14  Yes Historical Provider, MD  hyoscyamine (ANASPAZ) 0.125 MG TBDP disintergrating tablet Place 0.125 mg under the tongue 4 (four) times daily.   Yes Historical Provider, MD  ipratropium (ATROVENT) 0.02 % nebulizer solution inhale contents of 1 vial in nebulizer four times a day 01/24/15  Yes Collene Gobble, MD  loratadine (CLARITIN) 10 MG tablet Take 10 mg by mouth daily.   Yes Historical Provider, MD  pantoprazole (PROTONIX) 40 MG tablet Take 40 mg by mouth daily. 01/19/15  Yes Historical Provider, MD  pregabalin (LYRICA) 75 MG capsule Take 75 mg by mouth 2 (two) times daily.   Yes Historical Provider, MD  tiotropium (SPIRIVA) 18 MCG inhalation capsule Place 18 mcg into inhaler and inhale daily.   Yes Historical Provider, MD  triamterene-hydrochlorothiazide (DYAZIDE) 37.5-25 MG per capsule Take 1 capsule by mouth daily.   Yes Historical Provider, MD  zolpidem (AMBIEN) 10 MG tablet Take 10 mg by mouth at bedtime.   Yes Historical Provider, MD  oxyCODONE-acetaminophen (PERCOCET/ROXICET) 5-325 MG per tablet 1 to 2 tabs PO q6hrs  PRN for pain 03/06/15   Elmyra Ricks Gauri Galvao, PA-C   BP 121/66 mmHg  Pulse 91  Temp(Src) 98 F (36.7 C) (Oral)  Resp 21  SpO2 96%  LMP 06/04/2000 (LMP Unknown) Physical Exam  Constitutional: She is oriented to person, place, and time. She appears well-developed and well-nourished. No distress.  HENT:  Head: Normocephalic.   Mouth/Throat: Oropharynx is clear and moist.  Eyes: Conjunctivae and EOM are normal. Pupils are equal, round, and reactive to light.  Neck: Normal range of motion.  Cardiovascular: Normal rate, regular rhythm and intact distal pulses.   Pulmonary/Chest: Effort normal. No stridor. No respiratory distress. She has no wheezes. She has no rales. She exhibits tenderness.    O2 via nasal cannula, decreased air movement in all fields no appreciable wheezing.   Tender to palpation underneath the left breast crepitance  Abdominal: Soft. Bowel sounds are normal. She exhibits no distension and no mass. There is no tenderness. There is no rebound and no guarding.  Genitourinary:  Digital rectal exam a chaperoned by nurse:  No rashes or lesions, normal rectal tone, normal stool color  Musculoskeletal: Normal range of motion. She exhibits no edema or tenderness.  No calf asymmetry, superficial collaterals, palpable cords, edema, Homans sign negative bilaterally.    Neurological: She is alert and oriented to person, place, and time.  Psychiatric: She has a normal mood and affect.  Nursing note and vitals reviewed.   ED Course  Procedures (including critical care time) Labs Review Labs Reviewed  COMPREHENSIVE METABOLIC PANEL - Abnormal; Notable for the following:    AST 80 (*)    ALT 240 (*)    GFR calc non Af Amer 67 (*)    GFR calc Af Amer 77 (*)    All other components within normal limits  CBC WITH DIFFERENTIAL/PLATELET - Abnormal; Notable for the following:    RDW 16.3 (*)    All other components within normal limits  D-DIMER, QUANTITATIVE - Abnormal; Notable for the following:    D-Dimer, Quant 2.40 (*)    All other components within normal limits  CLOSTRIDIUM DIFFICILE BY PCR  LIPASE, BLOOD  BRAIN NATRIURETIC PEPTIDE  I-STAT TROPOININ, ED  POC OCCULT BLOOD, ED  Randolm Idol, ED    Imaging Review Dg Chest 2 View  03/06/2015   CLINICAL DATA:  Productive cough for 2 days   EXAM: CHEST  2 VIEW  COMPARISON:  03/04/2015, CT abdomen 11/20/2013  FINDINGS: There is chronic interstitial disease in the lingula unchanged compared with 11/20/2013 CT abdomen. There is no focal parenchymal opacity, pleural effusion, or pneumothorax. The heart and mediastinal contours are unremarkable.  The osseous structures are unremarkable.  IMPRESSION: No active cardiopulmonary disease.   Electronically Signed   By: Kathreen Devoid   On: 03/06/2015 15:26   Ct Angio Chest Pe W/cm &/or Wo Cm  03/06/2015   CLINICAL DATA:  Short of breath for 2 days. LEFT upper quadrant pain. COPD. Recent discharge for bronchitis.  EXAM: CT ANGIOGRAPHY CHEST WITH CONTRAST  TECHNIQUE: Multidetector CT imaging of the chest was performed using the standard protocol during bolus administration of intravenous contrast. Multiplanar CT image reconstructions and MIPs were obtained to evaluate the vascular anatomy.  CONTRAST:  158mL OMNIPAQUE IOHEXOL 350 MG/ML SOLN  COMPARISON:  None.  FINDINGS: Bones: No aggressive osseous lesions. Thoracic DISH. Confluent ossification extends into the cervical spine. Ankylosis across the spinous processes. Findings are consistent with DISH rather than ankylosing spondylitis.  Cardiovascular: Technically adequate study. Negative for pulmonary embolus. Enlargement of the pulmonary arteries suggesting pulmonary arterial hypertension. Study degraded by respiratory motion artifact. Aortic and branch vessel atherosclerotic calcification.  Lungs: Atelectasis at the lung bases.  No airspace consolidation.  Central airways: Patent.  Effusions: None.  Lymphadenopathy: None.  Esophagus: None.  Upper abdomen: Calcified granuloma in the anterior RIGHT hepatic lobe subcapsular region.  Other: Nonspecific 12 mm soft tissue nodule in the RIGHT breast appears unchanged compared to prior exam 11/20/2013.  Review of the MIP images confirms the above findings.  IMPRESSION: 1. Negative for pulmonary embolism. 2.  Atherosclerosis. 3. Atelectasis in the lungs.   Electronically Signed   By: Dereck Ligas M.D.   On: 03/06/2015 18:53     EKG Interpretation   Date/Time:  Sunday March 06 2015 15:46:41 EDT Ventricular Rate:  105 PR Interval:  129 QRS Duration: 77 QT Interval:  367 QTC Calculation: 485 R Axis:   54 Text Interpretation:  Sinus tachycardia Nonspecific T abnormalities,  lateral leads Confirmed by ALLEN  MD, ANTHONY ([id number]) on 03/06/2015 4:13:10  PM      MDM   Final diagnoses:  Chest pain  Chest wall pain    Filed Vitals:   03/06/15 1715 03/06/15 1730 03/06/15 1800 03/06/15 1902  BP:  108/75 123/74 121/66  Pulse: 104  102 91  Temp:      TempSrc:      Resp: 24 34 24 21  SpO2: 97%  95% 96%    Medications  0.9 %  sodium chloride infusion ( Intravenous New Bag/Given 03/06/15 1547)  morphine 4 MG/ML injection 4 mg (4 mg Intravenous Given 03/06/15 1547)  ketorolac (TORADOL) 30 MG/ML injection 15 mg (15 mg Intravenous Given 03/06/15 1642)  morphine 4 MG/ML injection 4 mg (4 mg Intravenous Given 03/06/15 1853)  iohexol (OMNIPAQUE) 350 MG/ML injection 100 mL (100 mLs Intravenous Contrast Given 03/06/15 1838)    Stephanie Schultz is a pleasant 59 y.o. female presenting with chest pain which she's had for several weeks worsening significantly over the last day, of note patient denies shortness of breath above her baseline (note this contradicts triage note, but I have confirmed this with the patient several times). EKG is nonischemic with no arrhythmia, troponin is negative. Blood work shows a transaminitis however her pain is very clearly on the left side. She has a normal bilirubin.  Discussed case with attending physician who recommends d-dimer, this is found to be elevated however CT angiogram is negative for PE.  Patient is tender to palpation under the left breast, she has COPD and chronic cough. I think this is likely just a chest wall irritation. Delta troponin is negative as well.  Patient states that she has ran out of her hydrocodone, she was using it too frequently. I've explained to her that she needs to take her medications as directed. I will write her for a very short prescription of oxycodone.  Evaluation does not show pathology that would require ongoing emergent intervention or inpatient treatment. Pt is hemodynamically stable and mentating appropriately. Discussed findings and plan with patient/guardian, who agrees with care plan. All questions answered. Return precautions discussed and outpatient follow up given.   New Prescriptions   OXYCODONE-ACETAMINOPHEN (PERCOCET/ROXICET) 5-325 MG PER TABLET    1 to 2 tabs PO q6hrs  PRN for pain         Monico Blitz, PA-C 03/07/15 0041  Lacretia Leigh, MD 03/08/15 [phone]

## 2015-03-17 ENCOUNTER — Telehealth: Payer: Self-pay | Admitting: Emergency Medicine

## 2015-03-17 DIAGNOSIS — J438 Other emphysema: Secondary | ICD-10-CM

## 2015-03-17 NOTE — Telephone Encounter (Signed)
Called spoke with Maynard. An updated O2 order has been placed. Nothing further needed

## 2015-03-22 ENCOUNTER — Other Ambulatory Visit: Payer: Self-pay

## 2015-03-22 DIAGNOSIS — Z1231 Encounter for screening mammogram for malignant neoplasm of breast: Secondary | ICD-10-CM

## 2015-03-31 ENCOUNTER — Observation Stay (HOSPITAL_COMMUNITY)
Admission: EM | Admit: 2015-03-31 | Discharge: 2015-04-01 | Disposition: A | Discharge status: Home / Self Care | Payer: Medicaid Other | Attending: Internal Medicine | Admitting: Internal Medicine

## 2015-03-31 ENCOUNTER — Encounter (HOSPITAL_COMMUNITY): Payer: Self-pay | Admitting: Emergency Medicine

## 2015-03-31 ENCOUNTER — Emergency Department (HOSPITAL_COMMUNITY): Payer: Medicaid Other

## 2015-03-31 DIAGNOSIS — J9610 Chronic respiratory failure, unspecified whether with hypoxia or hypercapnia: Secondary | ICD-10-CM | POA: Diagnosis not present

## 2015-03-31 DIAGNOSIS — R0781 Pleurodynia: Secondary | ICD-10-CM | POA: Diagnosis present

## 2015-03-31 DIAGNOSIS — J449 Chronic obstructive pulmonary disease, unspecified: Secondary | ICD-10-CM | POA: Diagnosis not present

## 2015-03-31 DIAGNOSIS — Z9851 Tubal ligation status: Secondary | ICD-10-CM | POA: Diagnosis not present

## 2015-03-31 DIAGNOSIS — G4733 Obstructive sleep apnea (adult) (pediatric): Secondary | ICD-10-CM | POA: Diagnosis not present

## 2015-03-31 DIAGNOSIS — J9611 Chronic respiratory failure with hypoxia: Secondary | ICD-10-CM | POA: Diagnosis present

## 2015-03-31 DIAGNOSIS — Z8701 Personal history of pneumonia (recurrent): Secondary | ICD-10-CM | POA: Insufficient documentation

## 2015-03-31 DIAGNOSIS — S2232XA Fracture of one rib, left side, initial encounter for closed fracture: Secondary | ICD-10-CM | POA: Diagnosis not present

## 2015-03-31 DIAGNOSIS — S2239XA Fracture of one rib, unspecified side, initial encounter for closed fracture: Secondary | ICD-10-CM | POA: Diagnosis present

## 2015-03-31 DIAGNOSIS — J438 Other emphysema: Secondary | ICD-10-CM

## 2015-03-31 DIAGNOSIS — E119 Type 2 diabetes mellitus without complications: Secondary | ICD-10-CM | POA: Insufficient documentation

## 2015-03-31 DIAGNOSIS — Z8541 Personal history of malignant neoplasm of cervix uteri: Secondary | ICD-10-CM | POA: Diagnosis not present

## 2015-03-31 DIAGNOSIS — K219 Gastro-esophageal reflux disease without esophagitis: Secondary | ICD-10-CM | POA: Insufficient documentation

## 2015-03-31 DIAGNOSIS — Z9981 Dependence on supplemental oxygen: Secondary | ICD-10-CM | POA: Diagnosis not present

## 2015-03-31 DIAGNOSIS — Z87891 Personal history of nicotine dependence: Secondary | ICD-10-CM | POA: Diagnosis not present

## 2015-03-31 DIAGNOSIS — F419 Anxiety disorder, unspecified: Secondary | ICD-10-CM | POA: Insufficient documentation

## 2015-03-31 DIAGNOSIS — D573 Sickle-cell trait: Secondary | ICD-10-CM | POA: Insufficient documentation

## 2015-03-31 DIAGNOSIS — I10 Essential (primary) hypertension: Secondary | ICD-10-CM | POA: Insufficient documentation

## 2015-03-31 DIAGNOSIS — R079 Chest pain, unspecified: Secondary | ICD-10-CM

## 2015-03-31 LAB — D-DIMER, QUANTITATIVE: D-Dimer, Quant: 2.17 ug/mL-FEU — ABNORMAL HIGH (ref 0.00–0.48)

## 2015-03-31 LAB — BASIC METABOLIC PANEL
Anion gap: 13 (ref 5–15)
BUN: 17 mg/dL (ref 6–20)
CALCIUM: 10.1 mg/dL (ref 8.9–10.3)
CO2: 28 mmol/L (ref 22–32)
Chloride: 100 mmol/L — ABNORMAL LOW (ref 101–111)
Creatinine, Ser: 0.94 mg/dL (ref 0.44–1.00)
GFR calc Af Amer: >60 mL/min (ref >60)
GLUCOSE: 94 mg/dL (ref 65–99)
Potassium: 3.9 mmol/L (ref 3.5–5.1)
Sodium: 141 mmol/L (ref 135–145)

## 2015-03-31 LAB — CBC WITH DIFFERENTIAL/PLATELET
BASOS ABS: 0 10*3/uL (ref 0.0–0.1)
BASOS PCT: 1 % (ref 0–1)
Eosinophils Absolute: 0.2 10*3/uL (ref 0.0–0.7)
Eosinophils Relative: 3 % (ref 0–5)
HCT: 34.5 % — ABNORMAL LOW (ref 36.0–46.0)
HEMOGLOBIN: 11.6 g/dL — AB (ref 12.0–15.0)
Lymphocytes Relative: 30 % (ref 12–46)
Lymphs Abs: 1.9 10*3/uL (ref 0.7–4.0)
MCH: 28.2 pg (ref 26.0–34.0)
MCHC: 33.6 g/dL (ref 30.0–36.0)
MCV: 83.9 fL (ref 78.0–100.0)
Monocytes Absolute: 0.3 10*3/uL (ref 0.1–1.0)
Monocytes Relative: 5 % (ref 3–12)
Neutro Abs: 4 10*3/uL (ref 1.7–7.7)
Neutrophils Relative %: 61 % (ref 43–77)
Platelets: 239 10*3/uL (ref 150–400)
RBC: 4.11 MIL/uL (ref 3.87–5.11)
RDW: 15 % (ref 11.5–15.5)
WBC: 6.4 10*3/uL (ref 4.0–10.5)

## 2015-03-31 LAB — URINALYSIS, ROUTINE W REFLEX MICROSCOPIC
Bilirubin Urine: NEGATIVE
Glucose, UA: NEGATIVE mg/dL
Hgb urine dipstick: NEGATIVE
KETONES UR: NEGATIVE mg/dL
LEUKOCYTES UA: NEGATIVE
NITRITE: NEGATIVE
PROTEIN: NEGATIVE mg/dL
Specific Gravity, Urine: 1.015 (ref 1.005–1.030)
Urobilinogen, UA: 0.2 mg/dL (ref 0.0–1.0)
pH: 7.5 (ref 5.0–8.0)

## 2015-03-31 LAB — TROPONIN I: Troponin I: <0.03 ng/mL (ref <0.031)

## 2015-03-31 LAB — GLUCOSE, CAPILLARY: Glucose-Capillary: 230 mg/dL — ABNORMAL HIGH (ref 65–99)

## 2015-03-31 LAB — BRAIN NATRIURETIC PEPTIDE: B NATRIURETIC PEPTIDE 5: 4.1 pg/mL (ref 0.0–100.0)

## 2015-03-31 MED ORDER — BUDESONIDE-FORMOTEROL FUMARATE 160-4.5 MCG/ACT IN AERO
2.0000 | INHALATION_SPRAY | Freq: Two times a day (BID) | RESPIRATORY_TRACT | Status: DC
  Administered 2015-03-31 – 2015-04-01 (×2): 2 via RESPIRATORY_TRACT
  Filled 2015-03-31: qty 6

## 2015-03-31 MED ORDER — ALBUTEROL SULFATE (2.5 MG/3ML) 0.083% IN NEBU: 2.5000 mg | INHALATION_SOLUTION | RESPIRATORY_TRACT | Status: DC | PRN

## 2015-03-31 MED ORDER — IPRATROPIUM-ALBUTEROL 0.5-2.5 (3) MG/3ML IN SOLN
3.0000 mL | Freq: Once | RESPIRATORY_TRACT | Status: AC
  Administered 2015-03-31: 3 mL via RESPIRATORY_TRACT
  Filled 2015-03-31: qty 3

## 2015-03-31 MED ORDER — HYDROMORPHONE HCL 1 MG/ML IJ SOLN
1.0000 mg | INTRAMUSCULAR | Status: DC | PRN
  Administered 2015-03-31 – 2015-04-01 (×2): 1 mg via INTRAVENOUS
  Filled 2015-03-31 (×2): qty 1

## 2015-03-31 MED ORDER — ALBUTEROL (5 MG/ML) CONTINUOUS INHALATION SOLN
10.0000 mg/h | INHALATION_SOLUTION | Freq: Once | RESPIRATORY_TRACT | Status: AC
  Administered 2015-03-31: 10 mg/h via RESPIRATORY_TRACT
  Filled 2015-03-31: qty 20

## 2015-03-31 MED ORDER — ENOXAPARIN SODIUM 40 MG/0.4ML ~~LOC~~ SOLN
40.0000 mg | SUBCUTANEOUS | Status: DC
  Administered 2015-03-31: 40 mg via SUBCUTANEOUS
  Filled 2015-03-31 (×2): qty 0.4

## 2015-03-31 MED ORDER — LORATADINE 10 MG PO TABS
10.0000 mg | ORAL_TABLET | Freq: Every day | ORAL | Status: DC
  Administered 2015-04-01: 10 mg via ORAL
  Filled 2015-03-31: qty 1

## 2015-03-31 MED ORDER — IPRATROPIUM-ALBUTEROL 0.5-2.5 (3) MG/3ML IN SOLN
3.0000 mL | Freq: Four times a day (QID) | RESPIRATORY_TRACT | Status: DC
  Administered 2015-04-01 (×2): 3 mL via RESPIRATORY_TRACT
  Filled 2015-03-31 (×2): qty 3

## 2015-03-31 MED ORDER — ALPRAZOLAM 0.5 MG PO TABS
0.5000 mg | ORAL_TABLET | Freq: Two times a day (BID) | ORAL | Status: DC | PRN
  Administered 2015-04-01: 0.5 mg via ORAL
  Filled 2015-03-31: qty 1

## 2015-03-31 MED ORDER — GUAIFENESIN ER 600 MG PO TB12
1200.0000 mg | ORAL_TABLET | Freq: Two times a day (BID) | ORAL | Status: DC
  Administered 2015-03-31: 1200 mg via ORAL
  Filled 2015-03-31: qty 2

## 2015-03-31 MED ORDER — IPRATROPIUM BROMIDE 0.02 % IN SOLN
RESPIRATORY_TRACT | Status: AC
  Administered 2015-03-31: 0.5 mg
  Filled 2015-03-31: qty 2.5

## 2015-03-31 MED ORDER — ALBUTEROL SULFATE (2.5 MG/3ML) 0.083% IN NEBU
5.0000 mg | INHALATION_SOLUTION | RESPIRATORY_TRACT | Status: DC | PRN
Start: 2015-03-31 — End: 2015-03-31

## 2015-03-31 MED ORDER — PANTOPRAZOLE SODIUM 40 MG PO TBEC
40.0000 mg | DELAYED_RELEASE_TABLET | Freq: Every day | ORAL | Status: DC
  Administered 2015-04-01: 40 mg via ORAL
  Filled 2015-03-31: qty 1

## 2015-03-31 MED ORDER — ALBUTEROL SULFATE (2.5 MG/3ML) 0.083% IN NEBU
2.5000 mg | INHALATION_SOLUTION | Freq: Four times a day (QID) | RESPIRATORY_TRACT | Status: DC
  Administered 2015-03-31: 2.5 mg via RESPIRATORY_TRACT
  Filled 2015-03-31: qty 3

## 2015-03-31 MED ORDER — INSULIN ASPART 100 UNIT/ML ~~LOC~~ SOLN
0.0000 [IU] | Freq: Three times a day (TID) | SUBCUTANEOUS | Status: DC
  Administered 2015-04-01 (×2): 1 [IU] via SUBCUTANEOUS

## 2015-03-31 MED ORDER — IPRATROPIUM BROMIDE 0.02 % IN SOLN
0.5000 mg | Freq: Four times a day (QID) | RESPIRATORY_TRACT | Status: DC
  Administered 2015-03-31: 0.5 mg via RESPIRATORY_TRACT
  Filled 2015-03-31: qty 2.5

## 2015-03-31 MED ORDER — ALLOPURINOL 100 MG PO TABS
100.0000 mg | ORAL_TABLET | Freq: Every day | ORAL | Status: DC
  Administered 2015-04-01: 100 mg via ORAL
  Filled 2015-03-31: qty 1

## 2015-03-31 MED ORDER — MORPHINE SULFATE 4 MG/ML IJ SOLN
4.0000 mg | Freq: Once | INTRAMUSCULAR | Status: AC
  Administered 2015-03-31: 4 mg via INTRAVENOUS
  Filled 2015-03-31: qty 1

## 2015-03-31 MED ORDER — SODIUM CHLORIDE 0.9 % IV SOLN: INTRAVENOUS | Status: DC

## 2015-03-31 MED ORDER — TRIAMTERENE-HCTZ 37.5-25 MG PO CAPS
1.0000 | ORAL_CAPSULE | Freq: Every day | ORAL | Status: DC
  Administered 2015-04-01: 1 via ORAL
  Filled 2015-03-31: qty 1

## 2015-03-31 MED ORDER — PREGABALIN 75 MG PO CAPS
75.0000 mg | ORAL_CAPSULE | Freq: Two times a day (BID) | ORAL | Status: DC
  Administered 2015-03-31 – 2015-04-01 (×2): 75 mg via ORAL
  Filled 2015-03-31 (×2): qty 1

## 2015-03-31 MED ORDER — METHYLPREDNISOLONE SODIUM SUCC 125 MG IJ SOLR
125.0000 mg | Freq: Once | INTRAMUSCULAR | Status: AC
Start: 2015-03-31 — End: 2015-03-31
  Administered 2015-03-31: 125 mg via INTRAVENOUS
  Filled 2015-03-31: qty 2

## 2015-03-31 MED ORDER — ALBUTEROL SULFATE (2.5 MG/3ML) 0.083% IN NEBU
2.5000 mg | INHALATION_SOLUTION | Freq: Four times a day (QID) | RESPIRATORY_TRACT | Status: DC | PRN
  Administered 2015-04-01: 2.5 mg via RESPIRATORY_TRACT
  Filled 2015-03-31: qty 3

## 2015-03-31 MED ORDER — IPRATROPIUM BROMIDE 0.02 % IN SOLN
0.5000 mg | Freq: Once | RESPIRATORY_TRACT | Status: DC
  Filled 2015-03-31: qty 2.5

## 2015-03-31 MED ORDER — MORPHINE SULFATE 2 MG/ML IJ SOLN
2.0000 mg | INTRAMUSCULAR | Status: DC | PRN
  Administered 2015-03-31: 2 mg via INTRAVENOUS
  Filled 2015-03-31: qty 1

## 2015-03-31 NOTE — ED Notes (Signed)
Pt incentive spirometry meet 750/1000.

## 2015-03-31 NOTE — H&P (Signed)
PCP:   Barbette Merino, MD   Chief Complaint:  Chest pain  HPI:  59 year old female who  has a past medical history of Hypertension; Anxiety; Emphysema; COPD (chronic obstructive pulmonary disease); OSA on CPAP; Dizziness; GERD (gastroesophageal reflux disease); Headache(784.0); Cancer (1990); Anemia; Morbid obesity; Sickle cell trait; cardiac catheterization; Tobacco abuse; Shortness of breath; Pneumonia; and Diabetes mellitus without complication. Today came to the hospital with chief complaint of left-sided chest pain getting worse on coughing and deep breathing. Patient was recently admitted to the hospital from 4/7-- 4/12 for acute on chronic respiratory failure at that time she was discharged Levaquin. Patient says that she never felt better since the discharge and a chest x-ray following antibiotics which showed pneumonia and was put him back on Levaquin by PCP. She had 3 visits at that time and was discharged home the last visit was at [facility] where she was prescribed antibiotics Augmentin which she has been taking and only has 1 pill left. Patient says that 2 days ago she developed left lower rib pain after coughing she felt a popping in her ribs. The pain is 8/10 intensity, also complains of thick white colored sputum. In the ED x-ray showed left eighth rib fracture with tiny pleural effusion. Patient being admitted for pain control She denies nausea vomiting or diarrhea. Denies dysuria urgency frequency of urination. Allergies:   Allergies  Allergen Reactions  . Acetaminophen Other (See Comments)    Pt was told that she could not take Tylenol, does not know why      Past Medical History  Diagnosis Date  . Hypertension   . Anxiety   . Emphysema   . COPD (chronic obstructive pulmonary disease)   . OSA on CPAP     CPAP at night   . Dizziness     pt believes this is motion sickness or vertigo  . GERD (gastroesophageal reflux disease)   . Headache(784.0)   . Cancer 1990   cervical   . Anemia   . Morbid obesity   . Sickle cell trait   . Hx of cardiac catheterization     a. LHC at [facility] in [place], [place] 09/2008:  Normal coronary arteries EF 70%.  . Tobacco abuse     a. up to 3ppd from age 81 to 21, now 1/4 ppd (01/2013)  . Shortness of breath   . Pneumonia   . Diabetes mellitus without complication     Past Surgical History  Procedure Laterality Date  . Tubal ligation    . Cardiac catheterization    . Colonoscopy  09/05/2012    Procedure: COLONOSCOPY;  Surgeon: Beryle Beams, MD;  Location: [facility];  Service: Endoscopy;  Laterality: N/A;    Prior to Admission medications   Medication Sig Start Date End Date Taking? Authorizing Provider  albuterol (PROVENTIL HFA;VENTOLIN HFA) 108 (90 BASE) MCG/ACT inhaler Inhale 2 puffs into the lungs every 4 (four) hours as needed for wheezing. 03/11/13  Yes Collene Gobble, MD  albuterol (PROVENTIL) (2.5 MG/3ML) 0.083% nebulizer solution Take 3 mLs (2.5 mg total) by nebulization 4 (four) times daily. DX J44.9 11/22/14  Yes Collene Gobble, MD  allopurinol (ZYLOPRIM) 100 MG tablet Take 100 mg by mouth daily.   Yes Historical Provider, MD  ALPRAZolam Duanne Moron) 0.5 MG tablet Take 1 tablet (0.5 mg total) by mouth 2 (two) times daily as needed for anxiety. 02/22/15  Yes Bonnielee Haff, MD  amoxicillin-clavulanate (AUGMENTIN) 875-125 MG per tablet Take 1 tablet by mouth  2 (two) times daily. for 10 days, pt switched to taking it once a day per nurses instructions due to headaches triggered by med. 03/15/15  Yes Historical Provider, MD  budesonide-formoterol (SYMBICORT) 160-4.5 MCG/ACT inhaler Inhale 2 puffs into the lungs 2 (two) times daily.   Yes Historical Provider, MD  dextromethorphan-guaiFENesin (ROBITUSSIN-DM) 10-100 MG/5ML liquid Take 10 mLs by mouth every 4 (four) hours as needed for cough.   Yes Historical Provider, MD  fluticasone (FLONASE) 50 MCG/ACT nasal spray Place 2 sprays into both nostrils 2 (two) times daily. 01/11/15   Yes Collene Gobble, MD  glipiZIDE (GLUCOTROL) 10 MG tablet Take 10 mg by mouth daily before breakfast.   Yes Historical Provider, MD  hyoscyamine (ANASPAZ) 0.125 MG TBDP disintergrating tablet Place 0.125 mg under the tongue 4 (four) times daily.   Yes Historical Provider, MD  indomethacin (INDOCIN) 25 MG capsule Take 1 capsule by mouth 2 (two) times daily as needed. For gout flareups 12/22/14  Yes Historical Provider, MD  ipratropium (ATROVENT) 0.02 % nebulizer solution inhale contents of 1 vial in nebulizer four times a day 01/24/15  Yes Collene Gobble, MD  loratadine (CLARITIN) 10 MG tablet Take 10 mg by mouth daily.   Yes Historical Provider, MD  oxyCODONE (ROXICODONE) 5 MG immediate release tablet Take 1 tablet (5 mg total) by mouth every 4 (four) hours as needed. Take 1-2 tablets every 4-6 hours as needed for pain control 03/06/15  Yes Nicole Pisciotta, PA-C  pantoprazole (PROTONIX) 40 MG tablet Take 40 mg by mouth daily. 01/19/15  Yes Historical Provider, MD  pregabalin (LYRICA) 75 MG capsule Take 75 mg by mouth 2 (two) times daily.   Yes Historical Provider, MD  tiotropium (SPIRIVA) 18 MCG inhalation capsule Place 18 mcg into inhaler and inhale daily.   Yes Historical Provider, MD  triamterene-hydrochlorothiazide (DYAZIDE) 37.5-25 MG per capsule Take 1 capsule by mouth daily.   Yes Historical Provider, MD  zolpidem (AMBIEN) 10 MG tablet Take 10 mg by mouth at bedtime.   Yes Historical Provider, MD  HYDROcodone-acetaminophen (NORCO) 7.5-325 MG per tablet Take 1 tablet by mouth every 4 (four) hours as needed for moderate pain or severe pain.  11/23/14   Historical Provider, MD    Social History:  reports that she has quit smoking. Her smoking use included Cigarettes. She has a 18 pack-year smoking history. She has never used smokeless tobacco. She reports that she does not drink alcohol or use illicit drugs.  Family History  Problem Relation Age of Onset  . Lung cancer Paternal Aunt   . Lung  cancer Paternal Grandfather   . Other Father     unaware of father's medical history  . Diabetes Mother     alive @ 17  . Myasthenia gravis Mother   . Other      multiple siblings a&w.     All the positives are listed in BOLD  Review of Systems:  HEENT: Headache, blurred vision, runny nose, sore throat Neck: Hypothyroidism, hyperthyroidism,,lymphadenopathy Chest : Shortness of breath, history of COPD, Asthma, cough Heart : Chest pain, history of coronary arterey disease GI:  Nausea, vomiting, diarrhea, constipation, GERD GU: Dysuria, urgency, frequency of urination, hematuria Neuro: Stroke, seizures, syncope Psych: Depression, anxiety, hallucinations   Physical Exam: Blood pressure 141/47, pulse 111, temperature 98.8 F (37.1 C), temperature source Oral, resp. rate 20, height 5' (1.524 m), weight 95.255 kg (210 lb), last menstrual period 06/04/2000, SpO2 90 %. Constitutional:   Patient is a  well-developed and well-nourished female* in no acute distress and cooperative with exam. Head: Normocephalic and atraumatic Mouth: Mucus membranes moist Eyes: PERRL, EOMI, conjunctivae normal Neck: Supple, No Thyromegaly Cardiovascular: RRR, S1 normal, S2 normal Pulmonary/Chest: CTAB, no wheezes, rales, or rhonchi Abdominal: Soft. Non-tender, non-distended, bowel sounds are normal, no masses, organomegaly, or guarding present.  Neurological: A&O x3, Strength is normal and symmetric bilaterally, cranial nerve II-XII are grossly intact, no focal motor deficit, sensory intact to light touch bilaterally.  Extremities : No Cyanosis, Clubbing or Edema  Labs on Admission:  Basic Metabolic Panel:  Recent Labs Lab 03/31/15 1231  NA 141  K 3.9  CL 100*  CO2 28  GLUCOSE 94  BUN 17  CREATININE 0.94  CALCIUM 10.1   Liver Function Tests: No results for input(s): AST, ALT, ALKPHOS, BILITOT, PROT, ALBUMIN in the last 168 hours. No results for input(s): LIPASE, AMYLASE in the last 168  hours. No results for input(s): AMMONIA in the last 168 hours. CBC:  Recent Labs Lab 03/31/15 1231  WBC 6.4  NEUTROABS 4.0  HGB 11.6*  HCT 34.5*  MCV 83.9  PLT 239   Cardiac Enzymes:  Recent Labs Lab 03/31/15 1231  TROPONINI <0.03    BNP (last 3 results)  Recent Labs  02/26/15 0424 03/06/15 1510 03/31/15 1231  BNP 23.9 4.9 4.1    ProBNP (last 3 results)  Recent Labs  07/05/14 1230  PROBNP 16.1    CBG: No results for input(s): GLUCAP in the last 168 hours.  Radiological Exams on Admission: Dg Ribs Unilateral W/chest Left  03/31/2015   CLINICAL DATA:  Left-sided rib pain.  EXAM: LEFT RIBS AND CHEST - 3+ VIEW  COMPARISON:  Chest CT for 03/02/2015 and chest radiograph 03/06/2015  FINDINGS: There are slightly increased bibasilar chest densities. Upper lungs remain clear. Heart size is normal. There is mild irregularity involving the left eighth rib. Cannot exclude a nondisplaced or minimally displaced fracture at this location.  IMPRESSION: Question a fracture involving the left eighth rib.  Slightly increased basilar densities could represent atelectasis. Cannot exclude a tiny left pleural effusion.   Electronically Signed   By: Markus Daft M.D.   On: 03/31/2015 14:10    EKG: Independently reviewed. Sinus rhythm   Assessment/Plan Active Problems:   COPD (chronic obstructive pulmonary disease)   Diabetes mellitus type 2, controlled   Hypertension   Chronic respiratory failure   Rib fracture  Rib fracture We'll admit the patient for pain control, incentive spirometry. We'll start morphine 2 mg every 4 hours when necessary for pain.  COPD Patient does not appear to be in exacerbation, continue albuterol ipratropium every 6 hours. Albuterol every 2 hours when necessary.  Diabetes mellitus We'll hold oral hypoglycemics, start sliding scale insulin with NovoLog  DVT prophylaxis Lovenox  Code status: Full code  Family discussion: No family at  bedside  Time Spent on Admission: 69 min  [facility] Pager: [phone] 03/31/2015, 5:51 PM  If 7PM-7AM, please contact night-coverage  [email or web address]  Password [email or web address]

## 2015-03-31 NOTE — Progress Notes (Signed)
Patient declines the use of nocturnal CPAP tonight. She wishes to use her home machine, but did not bring it with her to the hospital. She has agreed to have her daughter bring her home machine for use tomorrow night. RN aware.

## 2015-03-31 NOTE — ED Provider Notes (Signed)
CSN: [id number]     Arrival date & time 03/31/15  1143 History   First MD Initiated Contact with Patient 03/31/15 1155     Chief Complaint  Patient presents with  . Pneumonia  . rib cage pain      (Consider location/radiation/quality/duration/timing/severity/associated sxs/prior Treatment) The history is provided by the patient.     Pt with hx COPD, sleep apnea, HTN, morbid obesity, DM, tobacco use on chronic home O2 (2L [place]) with recent admission 4/7-4/12 for acute on chronic respiratory failure d/c home on Levaquin, reports she has never felt better since her discharge from the hospital, had a CXR following antibiotics in hospital that showed pneumonia, was put back on Levaquin by her PCP.  Has had three ED visits since that time and was d/c home each time, the most recent time for [facility] with an unknown antibiotics (?Augmentin).  States she has had left lower rib pain with coughing, she feels a popping in her ribs when she coughs that began 5 days ago.  The pain is described as slicing or digging.  Also notes thick chunky sputum that gags her when she lies flat and is now happening when she sits up. The sputum is beige/brown with occasional blood clots.  Has increased SOB with coughing.  Denies fevers, leg swelling, abdominal pain.   Past Medical History  Diagnosis Date  . Hypertension   . Anxiety   . Emphysema   . COPD (chronic obstructive pulmonary disease)   . OSA on CPAP     CPAP at night   . Dizziness     pt believes this is motion sickness or vertigo  . GERD (gastroesophageal reflux disease)   . Headache(784.0)   . Cancer 1990    cervical   . Anemia   . Morbid obesity   . Sickle cell trait   . Hx of cardiac catheterization     a. LHC at [facility] in [place], [place] 09/2008:  Normal coronary arteries EF 70%.  . Tobacco abuse     a. up to 3ppd from age 52 to 52, now 1/4 ppd (01/2013)  . Shortness of breath   . Pneumonia   . Diabetes mellitus without complication    Past Surgical  History  Procedure Laterality Date  . Tubal ligation    . Cardiac catheterization    . Colonoscopy  09/05/2012    Procedure: COLONOSCOPY;  Surgeon: Beryle Beams, MD;  Location: [facility];  Service: Endoscopy;  Laterality: N/A;   Family History  Problem Relation Age of Onset  . Lung cancer Paternal Aunt   . Lung cancer Paternal Grandfather   . Other Father     unaware of father's medical history  . Diabetes Mother     alive @ 37  . Myasthenia gravis Mother   . Other      multiple siblings a&w.   History  Substance Use Topics  . Smoking status: Former Smoker -- 0.50 packs/day for 36 years    Types: Cigarettes  . Smokeless tobacco: Never Used     Comment: Approx 90 pk-yrs (up to 3ppd until ~ 2009). Smoking 3 cigs per day now.  . Alcohol Use: No   OB History    No data available     Review of Systems  All other systems reviewed and are negative.     Allergies  Review of patient's allergies indicates no known allergies.  Home Medications   Prior to Admission medications   Medication Sig  Start Date End Date Taking? Authorizing Provider  acetaminophen (TYLENOL) 500 MG tablet Take 1,500 mg by mouth every 6 (six) hours as needed for mild pain or headache.    Historical Provider, MD  acetaminophen (TYLENOL) 650 MG CR tablet Take 650 mg by mouth every 8 (eight) hours as needed for pain.    Historical Provider, MD  albuterol (PROVENTIL HFA;VENTOLIN HFA) 108 (90 BASE) MCG/ACT inhaler Inhale 2 puffs into the lungs every 4 (four) hours as needed for wheezing. 03/11/13   Collene Gobble, MD  albuterol (PROVENTIL) (2.5 MG/3ML) 0.083% nebulizer solution Take 3 mLs (2.5 mg total) by nebulization 4 (four) times daily. DX J44.9 11/22/14   Collene Gobble, MD  allopurinol (ZYLOPRIM) 100 MG tablet Take 100 mg by mouth daily.    Historical Provider, MD  ALPRAZolam Duanne Moron) 0.5 MG tablet Take 1 tablet (0.5 mg total) by mouth 2 (two) times daily as needed for anxiety. 02/22/15   Bonnielee Haff,  MD  budesonide-formoterol (SYMBICORT) 160-4.5 MCG/ACT inhaler Inhale 2 puffs into the lungs 2 (two) times daily.    Historical Provider, MD  dextromethorphan-guaiFENesin (ROBITUSSIN-DM) 10-100 MG/5ML liquid Take 10 mLs by mouth every 4 (four) hours as needed for cough.    Historical Provider, MD  fluticasone (FLONASE) 50 MCG/ACT nasal spray Place 2 sprays into both nostrils 2 (two) times daily. 01/11/15   Collene Gobble, MD  glipiZIDE (GLUCOTROL) 10 MG tablet Take 10 mg by mouth daily before breakfast.    Historical Provider, MD  HYDROcodone-acetaminophen (NORCO) 7.5-325 MG per tablet Take 1 tablet by mouth every 4 (four) hours as needed for moderate pain or severe pain.  11/23/14   Historical Provider, MD  hyoscyamine (ANASPAZ) 0.125 MG TBDP disintergrating tablet Place 0.125 mg under the tongue 4 (four) times daily.    Historical Provider, MD  ipratropium (ATROVENT) 0.02 % nebulizer solution inhale contents of 1 vial in nebulizer four times a day 01/24/15   Collene Gobble, MD  loratadine (CLARITIN) 10 MG tablet Take 10 mg by mouth daily.    Historical Provider, MD  oxyCODONE (ROXICODONE) 5 MG immediate release tablet Take 1 tablet (5 mg total) by mouth every 4 (four) hours as needed. Take 1-2 tablets every 4-6 hours as needed for pain control 03/06/15   Elmyra Ricks Pisciotta, PA-C  pantoprazole (PROTONIX) 40 MG tablet Take 40 mg by mouth daily. 01/19/15   Historical Provider, MD  pregabalin (LYRICA) 75 MG capsule Take 75 mg by mouth 2 (two) times daily.    Historical Provider, MD  tiotropium (SPIRIVA) 18 MCG inhalation capsule Place 18 mcg into inhaler and inhale daily.    Historical Provider, MD  triamterene-hydrochlorothiazide (DYAZIDE) 37.5-25 MG per capsule Take 1 capsule by mouth daily.    Historical Provider, MD  zolpidem (AMBIEN) 10 MG tablet Take 10 mg by mouth at bedtime.    Historical Provider, MD   BP 140/74 mmHg  Temp(Src) 97.4 F (36.3 C) (Oral)  Resp 20  Ht 5' (1.524 m)  Wt 210 lb (95.255  kg)  BMI 41.01 kg/m2  SpO2 91%  LMP 06/04/2000 (LMP Unknown) Physical Exam  Constitutional: She appears well-developed and well-nourished. No distress.  Morbidly obese  HENT:  Head: Normocephalic and atraumatic.  Neck: Neck supple.  Cardiovascular: Normal rate and regular rhythm.   Pulmonary/Chest: Effort normal and breath sounds normal. No respiratory distress. She has no wheezes. She has no rales.    Abdominal: Soft. She exhibits no distension. There is no tenderness. There is  no rebound and no guarding.  Musculoskeletal: She exhibits no edema.  Neurological: She is alert.  Skin: She is not diaphoretic.  Nursing note and vitals reviewed.   ED Course  Procedures (including critical care time) Labs Review Labs Reviewed  CBC WITH DIFFERENTIAL/PLATELET - Abnormal; Notable for the following:    Hemoglobin 11.6 (*)    HCT 34.5 (*)    All other components within normal limits  BASIC METABOLIC PANEL - Abnormal; Notable for the following:    Chloride 100 (*)    All other components within normal limits  D-DIMER, QUANTITATIVE - Abnormal; Notable for the following:    D-Dimer, Quant 2.17 (*)    All other components within normal limits  BRAIN NATRIURETIC PEPTIDE  TROPONIN I  URINALYSIS, ROUTINE W REFLEX MICROSCOPIC    Imaging Review Dg Ribs Unilateral W/chest Left  03/31/2015   CLINICAL DATA:  Left-sided rib pain.  EXAM: LEFT RIBS AND CHEST - 3+ VIEW  COMPARISON:  Chest CT for 03/02/2015 and chest radiograph 03/06/2015  FINDINGS: There are slightly increased bibasilar chest densities. Upper lungs remain clear. Heart size is normal. There is mild irregularity involving the left eighth rib. Cannot exclude a nondisplaced or minimally displaced fracture at this location.  IMPRESSION: Question a fracture involving the left eighth rib.  Slightly increased basilar densities could represent atelectasis. Cannot exclude a tiny left pleural effusion.   Electronically Signed   By: Markus Daft  M.D.   On: 03/31/2015 14:10     EKG Interpretation   Date/Time:  Thursday Mar 31 2015 12:27:16 EDT Ventricular Rate:  83 PR Interval:  141 QRS Duration: 77 QT Interval:  380 QTC Calculation: 446 R Axis:   58 Text Interpretation:  Sinus rhythm No significant change since last  tracing Confirmed by Zenia Resides  MD, ANTHONY ([id number]) on 03/31/2015 12:30:42 PM       03/06/15 CT angio chest negative for PE.    MDM   Final diagnoses:  Left rib fracture, closed, initial encounter  Chronic respiratory failure, unspecified whether with hypoxia or hypercapnia  Chronic obstructive pulmonary disease, unspecified COPD, unspecified chronic bronchitis type   Afebrile nontoxic patient with chronic respiratory failure, COPD, sleep apnea, morbid obesity on home 2L [place] with continued cough productive of large amount of sputum she feels is choking her, causing difficulty for her to manage her symptoms at home or lie flat.  Has been hospitalized in early April, multiple rounds of antibiotics, found to have left rib fracture, likely from coughing, there is no trauma otherwise.  Possible tiny pleural effusion on left.  O2 drops to 88% while ambulating.  Pt continues to have significant pain.  She is unable to do incentive spirometry at this point secondary to pain.  Given patient's multiple comorbidities, I am concerned about significant worsening if not monitored and given better pain control, treatment for COPD prior to d/c home.  Discussed pt with Dr Darrick Meigs, [facility], who accepts patient for admission.     Clayton Bibles, PA-C 03/31/15 1545  Lacretia Leigh, MD 04/05/15 [phone]

## 2015-03-31 NOTE — ED Notes (Signed)
Pt presents to ED via EMS C/O L sided rib pain. Pt was admitted back on April 7th-12th for pneumonia. Pt has since had 3 ED visits ([facility], [facility]). Pt states that she is not able to sleep or lie down b/c trouble breathing and large amounts of mucous. Pt stated on Saturday night she was coughing and heard a "pop" with severe sharp pain L rib area radiating to back.

## 2015-03-31 NOTE — ED Notes (Signed)
Bed: OV29 Expected date:  Expected time:  Means of arrival:  Comments: EMS-LUQ pain

## 2015-04-01 ENCOUNTER — Inpatient Hospital Stay (HOSPITAL_COMMUNITY): Payer: Medicaid Other

## 2015-04-01 ENCOUNTER — Telehealth: Payer: Self-pay | Admitting: Emergency Medicine

## 2015-04-01 DIAGNOSIS — S2232XA Fracture of one rib, left side, initial encounter for closed fracture: Secondary | ICD-10-CM | POA: Diagnosis not present

## 2015-04-01 DIAGNOSIS — E119 Type 2 diabetes mellitus without complications: Secondary | ICD-10-CM | POA: Diagnosis not present

## 2015-04-01 DIAGNOSIS — J410 Simple chronic bronchitis: Secondary | ICD-10-CM

## 2015-04-01 LAB — GLUCOSE, CAPILLARY
Glucose-Capillary: 147 mg/dL — ABNORMAL HIGH (ref 65–99)
Glucose-Capillary: 131 mg/dL — ABNORMAL HIGH (ref 65–99)

## 2015-04-01 LAB — COMPREHENSIVE METABOLIC PANEL
ALBUMIN: 3.9 g/dL (ref 3.5–5.0)
ALK PHOS: 34 U/L — AB (ref 38–126)
ALT: 22 U/L (ref 14–54)
AST: 21 U/L (ref 15–41)
Anion gap: 11 (ref 5–15)
BUN: 23 mg/dL — AB (ref 6–20)
CHLORIDE: 100 mmol/L — AB (ref 101–111)
CO2: 26 mmol/L (ref 22–32)
Calcium: 9.5 mg/dL (ref 8.9–10.3)
Creatinine, Ser: 1.03 mg/dL — ABNORMAL HIGH (ref 0.44–1.00)
GFR calc non Af Amer: 60 mL/min — ABNORMAL LOW (ref >60)
Glucose, Bld: 192 mg/dL — ABNORMAL HIGH (ref 65–99)
POTASSIUM: 4.2 mmol/L (ref 3.5–5.1)
Sodium: 137 mmol/L (ref 135–145)
TOTAL PROTEIN: 7.8 g/dL (ref 6.5–8.1)
Total Bilirubin: 0.2 mg/dL — ABNORMAL LOW (ref 0.3–1.2)

## 2015-04-01 LAB — CBC
HCT: 33.7 % — ABNORMAL LOW (ref 36.0–46.0)
Hemoglobin: 11.5 g/dL — ABNORMAL LOW (ref 12.0–15.0)
MCH: 28.5 pg (ref 26.0–34.0)
MCHC: 34.1 g/dL (ref 30.0–36.0)
MCV: 83.6 fL (ref 78.0–100.0)
Platelets: 244 10*3/uL (ref 150–400)
RBC: 4.03 MIL/uL (ref 3.87–5.11)
RDW: 14.8 % (ref 11.5–15.5)
WBC: 10.8 10*3/uL — AB (ref 4.0–10.5)

## 2015-04-01 MED ORDER — IPRATROPIUM BROMIDE 0.02 % IN SOLN: 0.5000 mg | Freq: Four times a day (QID) | RESPIRATORY_TRACT | Status: DC | PRN

## 2015-04-01 MED ORDER — IBUPROFEN 600 MG PO TABS: 600.0000 mg | ORAL_TABLET | Freq: Three times a day (TID) | ORAL | Status: DC | PRN

## 2015-04-01 MED ORDER — OXYCODONE HCL 5 MG PO TABS
5.0000 mg | ORAL_TABLET | Freq: Four times a day (QID) | ORAL | Status: DC | PRN
  Administered 2015-04-01: 5 mg via ORAL
  Filled 2015-04-01: qty 1

## 2015-04-01 MED ORDER — HYDROCODONE-ACETAMINOPHEN 7.5-325 MG PO TABS: 1.0000 | ORAL_TABLET | ORAL | Status: DC | PRN

## 2015-04-01 MED ORDER — HYDROCODONE-ACETAMINOPHEN 5-325 MG PO TABS: 1.0000 | ORAL_TABLET | ORAL | Status: DC | PRN

## 2015-04-01 MED ORDER — IBUPROFEN 200 MG PO TABS
400.0000 mg | ORAL_TABLET | Freq: Four times a day (QID) | ORAL | Status: DC | PRN
  Administered 2015-04-01: 400 mg via ORAL
  Filled 2015-04-01: qty 2

## 2015-04-01 NOTE — Discharge Instructions (Signed)
Follow with Primary MD Barbette Merino, MD in 7 days , USE the IS given every hour while awake  Get CBC, CMP, 2 view Chest X ray checked  by Primary MD next visit.    Activity: As tolerated with Full fall precautions use walker/cane & assistance as needed   Disposition Home     Diet: Heart Healthy Low Carb , with feeding assistance and aspiration precautions.  Keep yourself well hydrated.   On your next visit with your primary care physician please Get Medicines reviewed and adjusted.   Please request your Prim.MD to go over all Hospital Tests and Procedure/Radiological results at the follow up, please get all Hospital records sent to your Prim MD by signing hospital release before you go home.   If you experience worsening of your admission symptoms, develop shortness of breath, life threatening emergency, suicidal or homicidal thoughts you must seek medical attention immediately by calling 911 or calling your MD immediately  if symptoms less severe.  You Must read complete instructions/literature along with all the possible adverse reactions/side effects for all the Medicines you take and that have been prescribed to you. Take any new Medicines after you have completely understood and accpet all the possible adverse reactions/side effects.   Do not drive, operating heavy machinery, perform activities at heights, swimming or participation in water activities or provide baby sitting services if your were admitted for syncope or siezures until you have seen by Primary MD or a Neurologist and advised to do so again.  Do not drive when taking Pain medications.    Do not take more than prescribed Pain, Sleep and Anxiety Medications  Special Instructions: If you have smoked or chewed Tobacco  in the last 2 yrs please stop smoking, stop any regular Alcohol  and or any Recreational drug use.  Wear Seat belts while driving.   Please note  You were cared for by a hospitalist during your  hospital stay. If you have any questions about your discharge medications or the care you received while you were in the hospital after you are discharged, you can call the unit and asked to speak with the hospitalist on call if the hospitalist that took care of you is not available. Once you are discharged, your primary care physician will handle any further medical issues. Please note that NO REFILLS for any discharge medications will be authorized once you are discharged, as it is imperative that you return to your primary care physician (or establish a relationship with a primary care physician if you do not have one) for your aftercare needs so that they can reassess your need for medications and monitor your lab values.

## 2015-04-01 NOTE — Progress Notes (Signed)
[facility]  Patient Status: Active (receiving services up to time of hospitalization)  [facility] is providing the following services: RN  If patient discharges after hours, please call [phone].   Lurlean Leyden 04/01/2015, 9:29 AM

## 2015-04-01 NOTE — Progress Notes (Signed)
Pt leaving at this time with her family. Alert, oriented,and without c/o. Discharge instructions/prescriptions given/explained with pt verbalizing understanding.  Pt leaving with home O2 intact. IS sent home and explained.

## 2015-04-01 NOTE — Discharge Summary (Signed)
Stephanie Schultz, is a 59 y.o. female  DOB 1955/12/26  MRN [id number].  Admission date:  03/31/2015  Admitting Physician  Oswald Hillock, MD  Discharge Date:  04/01/2015   Primary MD  Barbette Merino, MD  Recommendations for primary care physician for things to follow:   Repeat chest x-ray, CBC and BMP in a week.   Admission Diagnosis  Left rib fracture, closed, initial encounter [S22.32XA] Chronic respiratory failure, unspecified whether with hypoxia or hypercapnia [J96.10] Chronic obstructive pulmonary disease, unspecified COPD, unspecified chronic bronchitis type [J44.9]   Discharge Diagnosis  Left rib fracture, closed, initial encounter [S22.32XA] Chronic respiratory failure, unspecified whether with hypoxia or hypercapnia [J96.10] Chronic obstructive pulmonary disease, unspecified COPD, unspecified chronic bronchitis type [J44.9]    Active Problems:   COPD (chronic obstructive pulmonary disease)   Diabetes mellitus type 2, controlled   Hypertension   Chronic respiratory failure   Rib fracture      Past Medical History  Diagnosis Date  . Hypertension   . Anxiety   . Emphysema   . COPD (chronic obstructive pulmonary disease)   . OSA on CPAP     CPAP at night   . Dizziness     pt believes this is motion sickness or vertigo  . GERD (gastroesophageal reflux disease)   . Headache(784.0)   . Cancer 1990    cervical   . Anemia   . Morbid obesity   . Sickle cell trait   . Hx of cardiac catheterization     a. LHC at [facility] in [place], [place] 09/2008:  Normal coronary arteries EF 70%.  . Tobacco abuse     a. up to 3ppd from age 72 to 88, now 1/4 ppd (01/2013)  . Shortness of breath   . Pneumonia   . Diabetes mellitus without complication     Past Surgical History  Procedure Laterality Date  . Tubal ligation     . Cardiac catheterization    . Colonoscopy  09/05/2012    Procedure: COLONOSCOPY;  Surgeon: Beryle Beams, MD;  Location: [facility];  Service: Endoscopy;  Laterality: N/A;       History of present illness and  Hospital Course:     Kindly see H&P for history of present illness and admission details, please review complete Labs, Consult reports and Test reports for all details in brief  HPI  from the history and physical done on the day of admission  59 year old female who  has a past medical history of Hypertension; Anxiety; Emphysema; COPD (chronic obstructive pulmonary disease); OSA on CPAP; Dizziness; GERD (gastroesophageal reflux disease); Headache(784.0); Cancer (1990); Anemia; Morbid obesity; Sickle cell trait; cardiac catheterization; Tobacco abuse; Shortness of breath; Pneumonia; and Diabetes mellitus without complication.  Today came to the hospital with chief complaint of left-sided chest pain getting worse on coughing and deep breathing. Patient was recently admitted to the hospital from 4/7-- 4/12 for acute on chronic respiratory failure at that time she was discharged Levaquin. Patient says that she never felt better since  the discharge and a chest x-ray following antibiotics which showed pneumonia and was put him back on Levaquin by PCP. She had 3 visits at that time and was discharged home the last visit was at [facility] where she was prescribed antibiotics Augmentin which she has been taking and only has 1 pill left. Patient says that 2 days ago she developed left lower rib pain after coughing she felt a popping in her ribs. The pain is 8/10 intensity, also complains of thick white colored sputum.In the ED x-ray showed left eighth rib fracture with tiny pleural effusion. Patient being admitted for pain control. She denies nausea vomiting or diarrhea. Denies dysuria urgency frequency of urination.    Hospital Course   1. L. rib cage pain which is pleuritic secondary to left eighth  rib fracture which happened from excessive coughing, patient actually heard the pop when it happened. He has underlying history of COPD and is oxygen dependent, she was otherwise afebrile, nontoxic appearing, will place her on 5 days of when necessary NSAIDs along with her home narcotic regimen. She is on home oxygen which will be continued. Request PCP to repeat 2 view chest x-ray along with a CBC BMP in the next 7-10 days. She has been provided incentive spirometer and educated on using  IS personally.   2. COPD. At baseline no acute issues.   3. DM type II. Continue home regimen unchanged.   4. GERD. On PPI.   Discharge Condition: Stable   Follow UP      Discharge Instructions  and  Discharge Medications          Discharge Instructions    Discharge instructions    Complete by:  As directed   Follow with Primary MD GARBA,LAWAL, MD in 7 days , USE the IS given every hour while awake  Get CBC, CMP, 2 view Chest X ray checked  by Primary MD next visit.    Activity: As tolerated with Full fall precautions use walker/cane & assistance as needed   Disposition Home     Diet: Heart Healthy Low Carb , with feeding assistance and aspiration precautions.  Keep yourself well hydrated.   On your next visit with your primary care physician please Get Medicines reviewed and adjusted.   Please request your Prim.MD to go over all Hospital Tests and Procedure/Radiological results at the follow up, please get all Hospital records sent to your Prim MD by signing hospital release before you go home.   If you experience worsening of your admission symptoms, develop shortness of breath, life threatening emergency, suicidal or homicidal thoughts you must seek medical attention immediately by calling 911 or calling your MD immediately  if symptoms less severe.  You Must read complete instructions/literature along with all the possible adverse reactions/side effects for all the Medicines  you take and that have been prescribed to you. Take any new Medicines after you have completely understood and accpet all the possible adverse reactions/side effects.   Do not drive, operating heavy machinery, perform activities at heights, swimming or participation in water activities or provide baby sitting services if your were admitted for syncope or siezures until you have seen by Primary MD or a Neurologist and advised to do so again.  Do not drive when taking Pain medications.    Do not take more than prescribed Pain, Sleep and Anxiety Medications  Special Instructions: If you have smoked or chewed Tobacco  in the last 2 yrs please stop smoking, stop any  regular Alcohol  and or any Recreational drug use.  Wear Seat belts while driving.   Please note  You were cared for by a hospitalist during your hospital stay. If you have any questions about your discharge medications or the care you received while you were in the hospital after you are discharged, you can call the unit and asked to speak with the hospitalist on call if the hospitalist that took care of you is not available. Once you are discharged, your primary care physician will handle any further medical issues. Please note that NO REFILLS for any discharge medications will be authorized once you are discharged, as it is imperative that you return to your primary care physician (or establish a relationship with a primary care physician if you do not have one) for your aftercare needs so that they can reassess your need for medications and monitor your lab values.     Increase activity slowly    Complete by:  As directed             Medication List    STOP taking these medications        amoxicillin-clavulanate 875-125 MG per tablet  Commonly known as:  AUGMENTIN     indomethacin 25 MG capsule  Commonly known as:  INDOCIN     oxyCODONE 5 MG immediate release tablet  Commonly known as:  ROXICODONE      TAKE these  medications        albuterol 108 (90 BASE) MCG/ACT inhaler  Commonly known as:  PROVENTIL HFA;VENTOLIN HFA  Inhale 2 puffs into the lungs every 4 (four) hours as needed for wheezing.     albuterol (2.5 MG/3ML) 0.083% nebulizer solution  Commonly known as:  PROVENTIL  Take 3 mLs (2.5 mg total) by nebulization 4 (four) times daily. DX J44.9     allopurinol 100 MG tablet  Commonly known as:  ZYLOPRIM  Take 100 mg by mouth daily.     ALPRAZolam 0.5 MG tablet  Commonly known as:  XANAX  Take 1 tablet (0.5 mg total) by mouth 2 (two) times daily as needed for anxiety.     budesonide-formoterol 160-4.5 MCG/ACT inhaler  Commonly known as:  SYMBICORT  Inhale 2 puffs into the lungs 2 (two) times daily.     dextromethorphan-guaiFENesin 10-100 MG/5ML liquid  Commonly known as:  ROBITUSSIN-DM  Take 10 mLs by mouth every 4 (four) hours as needed for cough.     fluticasone 50 MCG/ACT nasal spray  Commonly known as:  FLONASE  Place 2 sprays into both nostrils 2 (two) times daily.     glipiZIDE 10 MG tablet  Commonly known as:  GLUCOTROL  Take 10 mg by mouth daily before breakfast.     HYDROcodone-acetaminophen 7.5-325 MG per tablet  Commonly known as:  NORCO  Take 1 tablet by mouth every 4 (four) hours as needed for moderate pain or severe pain.     hyoscyamine 0.125 MG Tbdp disintergrating tablet  Commonly known as:  ANASPAZ  Place 0.125 mg under the tongue 4 (four) times daily.     ibuprofen 600 MG tablet  Commonly known as:  ADVIL,MOTRIN  Take 1 tablet (600 mg total) by mouth every 8 (eight) hours as needed for headache.     ipratropium 0.02 % nebulizer solution  Commonly known as:  ATROVENT  inhale contents of 1 vial in nebulizer four times a day     loratadine 10 MG tablet  Commonly known as:  CLARITIN  Take 10 mg by mouth daily.     pantoprazole 40 MG tablet  Commonly known as:  PROTONIX  Take 40 mg by mouth daily.     pregabalin 75 MG capsule  Commonly known as:   LYRICA  Take 75 mg by mouth 2 (two) times daily.     tiotropium 18 MCG inhalation capsule  Commonly known as:  SPIRIVA  Place 18 mcg into inhaler and inhale daily.     triamterene-hydrochlorothiazide 37.5-25 MG per capsule  Commonly known as:  DYAZIDE  Take 1 capsule by mouth daily.     zolpidem 10 MG tablet  Commonly known as:  AMBIEN  Take 10 mg by mouth at bedtime.          Diet and Activity recommendation: See Discharge Instructions above   Consults obtained - None   Major procedures and Radiology Reports - PLEASE review detailed and final reports for all details, in brief -    Dg Ribs Unilateral Left  04/01/2015   CLINICAL DATA:  Chest pain, left anterior rib pain  EXAM: LEFT RIBS - 2 VIEW  COMPARISON:  03/31/2015  FINDINGS: Cardiac shadow is stable. There is a minimally displaced fracture of the left eighth rib identified posteriorly. This is similar to that seen on the recent exam. No other fractures are noted. No pneumothorax is seen. A small left-sided pleural effusion is again noted.  IMPRESSION: Stable minimally displaced left eighth rib fracture without complicating factors.   Electronically Signed   By: Inez Catalina M.D.   On: 04/01/2015 11:58   Dg Ribs Unilateral W/chest Left  03/31/2015   CLINICAL DATA:  Left-sided rib pain.  EXAM: LEFT RIBS AND CHEST - 3+ VIEW  COMPARISON:  Chest CT for 03/02/2015 and chest radiograph 03/06/2015  FINDINGS: There are slightly increased bibasilar chest densities. Upper lungs remain clear. Heart size is normal. There is mild irregularity involving the left eighth rib. Cannot exclude a nondisplaced or minimally displaced fracture at this location.  IMPRESSION: Question a fracture involving the left eighth rib.  Slightly increased basilar densities could represent atelectasis. Cannot exclude a tiny left pleural effusion.   Electronically Signed   By: Markus Daft M.D.   On: 03/31/2015 14:10       Micro Results      No results found  for this or any previous visit (from the past 240 hour(s)).     Today   Subjective:   Stephanie Schultz today has no headache,no  abdominal pain,no new weakness tingling or numbness, feels much better wants to go home today.  Mild L. rib care pain.  Objective:   Blood pressure 128/59, pulse 98, temperature 98.3 F (36.8 C), temperature source Oral, resp. rate 22, height 5' (1.524 m), weight 95.255 kg (210 lb), last menstrual period 06/04/2000, SpO2 96 %.   Intake/Output Summary (Last 24 hours) at 04/01/15 1241 Last data filed at 04/01/15 0933  Gross per 24 hour  Intake    240 ml  Output      0 ml  Net    240 ml    Exam Awake Alert, Oriented x 3, No new F.N deficits, Normal affect [place].AT,PERRAL Supple Neck,No JVD, No cervical lymphadenopathy appriciated.  Symmetrical Chest wall movement, Good air movement bilaterally, CTAB RRR,No Gallops,Rubs or new Murmurs, No Parasternal Heave +ve B.Sounds, Abd Soft, Non tender, No organomegaly appriciated, No rebound -guarding or rigidity. No Cyanosis, Clubbing or edema, No new Rash or bruise  Data Review  CBC w Diff:  Lab Results  Component Value Date   WBC 10.8* 04/01/2015   HGB 11.5* 04/01/2015   HCT 33.7* 04/01/2015   PLT 244 04/01/2015   LYMPHOPCT 30 03/31/2015   MONOPCT 5 03/31/2015   EOSPCT 3 03/31/2015   BASOPCT 1 03/31/2015    CMP:  Lab Results  Component Value Date   NA 137 04/01/2015   K 4.2 04/01/2015   CL 100* 04/01/2015   CO2 26 04/01/2015   BUN 23* 04/01/2015   CREATININE 1.03* 04/01/2015   PROT 7.8 04/01/2015   ALBUMIN 3.9 04/01/2015   BILITOT 0.2* 04/01/2015   ALKPHOS 34* 04/01/2015   AST 21 04/01/2015   ALT 22 04/01/2015  .   Total Time in preparing paper work, data evaluation and todays exam - 35 minutes  Thurnell Lose M.D on 04/01/2015 at 12:41 PM  [facility]   Office  [phone]

## 2015-04-01 NOTE — Telephone Encounter (Signed)
Spoke with pt, needs atrovent refill.  This has been sent. Also, pt states she was just hospitalized and needs a follow up with RB.  Pt scheduled for next available.  Nothing further needed at this time.

## 2015-04-02 LAB — HEMOGLOBIN A1C
Hgb A1c MFr Bld: 6.8 % — ABNORMAL HIGH (ref 4.8–5.6)
Mean Plasma Glucose: 148 mg/dL

## 2015-04-18 ENCOUNTER — Encounter: Payer: Self-pay | Admitting: Emergency Medicine

## 2015-04-18 ENCOUNTER — Ambulatory Visit (INDEPENDENT_AMBULATORY_CARE_PROVIDER_SITE_OTHER): Payer: Medicaid Other | Admitting: Emergency Medicine

## 2015-04-18 VITALS — BP 142/58 | HR 98 | Wt 217.0 lb

## 2015-04-18 DIAGNOSIS — G4733 Obstructive sleep apnea (adult) (pediatric): Secondary | ICD-10-CM

## 2015-04-18 DIAGNOSIS — J410 Simple chronic bronchitis: Secondary | ICD-10-CM

## 2015-04-18 NOTE — Patient Instructions (Signed)
Please continue your current inhaled medications as you're taking them Try stopping your Flonase for 3 days to see if your nose discomfort improves Try using nasal saline spray to moisten your nostrils and prevent irritation and pain Try to restart your CPAP at night We will get you information about stopping smoking, and classes to help you.  Follow with Dr Lamonte Sakai in 2 months or sooner if you have any problems.

## 2015-04-18 NOTE — Assessment & Plan Note (Signed)
She needs to restart her CPAP. We discussed this in detail today. Some pain in her right nostril has been keeping her from wearing nasal pillows. We will temporarily stop her Flonase, try nasal saline. Try to restart the nasal pillows

## 2015-04-18 NOTE — Assessment & Plan Note (Signed)
I unfortunately believe that we are at an impasse and will continue to have symptoms and flares as long she continues to smoke. I will continue her current inhaled medications. Continue to treat her allergic rhinitis. We discussed smoking cessation in detail today. He has tried nicotine replacement without success. I believe this can be an adjunct but she needs other support. We will arrange for her to go to smoking cessation classes

## 2015-04-18 NOTE — Progress Notes (Signed)
Subjective:    Patient ID: Stephanie Schultz, female    DOB: [birth year], 59 y.o.   MRN: [id number]  HPI 59 yo smoker (90 pk-yrs, has cut down to about 2 cig a day), hx HTN, borderline DM, allergies. Dx with COPD at [facility] in [place]. She was started on O2 in ~2009. She had PFT within the last year. She remains fairly active as long as she wears her O2. She can walk about 30-40 feet, has to stop when shopping to rest. She has daily cough, productive of white phlegm. She has rare exacerbations, last was in Jan 2012.   ROV 07/05/11 -- COPD, OSA. Her data from [place] showed she needs CPAP 10. We ordered O2 thru [facility]. She continues to smoke 3 cigs a day. She has been coughing more, having more sputum - yellow/white sputum.  She is having more dyspnea, more wheezing x 3 weeks. She has gained 30+ lbs since coming to [place]. She has not been wearing her O2 reliably.   ROV 09/20/11 -- COPD, OSA. Returns for f/u. Tells me that she has been rx for COPD exacerbation x 2 since our last visit. She tells me that her CXR showed "a spot on one of her lungs". Was done at [facility] ER on 08/23/11. She was treated with pred and azithro on each occasion. She is on Advair and Spiriva. Continues to have severe cough, costochondritic pain. The pred and azithro helped some but not completely. Ran out loratadine 2 weeks ago. Still on omeprazole bid. She is wearing CPAP 10 reliably.   ROV 10/25/11 -- COPD, OSA on CPAP 10. Last time we started [facility] in place of Advair to see if this helped w UA sx. Still on Spiriva. Restarted loratadine and continued omeprazole bid. She feels that her breathing is the same, cough is a bit better on the [facility]. No flares, but she is still having nasal gtt despite the loratadine.   12/17/2011 Acute OV  Pt presents for an acute office visit, complains of increased SOB, wheezing, prod cough with yellow-to-brown mucus  2 weeks . Went to ED on 1.23.13 and was given zpak and prednisone 50mg  x 4 days. CXR with  chronic changing. Has finished abx and steroids. Got some better but cough never went away. Still smoking 1/2 PPD (takes O2 off -goes outside to smoke) .  Remains on Dulera and Spiriva  With no missed doses.  OTC not helping.  Wants referral for home health.  Encouraged to quit smoking.   ROV 02/18/12 -- COPD, OSA on CPAP, chronic cough. Had an AE with residual cough as described above 2/13. Remains on Spiriva + Dulera. Remains on omeprazole bid, flonase, not using loratadine right now. Still smoking about 3 cig a day. She is interested in trying the nicotine patches  ROV 07/08/12 -- COPD, OSA on CPAP, chronic cough. She reports that she quit smoking July 3! She has gained 60 lbs over the last year. Her breathing is stable, still limited. She is wearing her O2 and her CPAP. Continues to take Spiriva + Dulera. She reports that she is still having GERD sx even on omeprazole bid. She ran out of flonase 1 month ago. She has had some epistaxis on R.   ROV 11/25/12 -- COPD, OSA on CPAP, chronic cough. Since last time she restarted smoking - currently at 5 cig a day. She ran out of her [name] a week ago. Still taking spiriva. She has been having more cough over about  2 weeks. She has been using albuterol nebs bid - about to run out of it, need refill. She remains on loratadine, fluticasone, omeprazole. She hasn't been using CPAP regularly - needs new mask, this is being arranged. She is to undergo breast bx for a nodule this week.    [facility] follow up 12/19/12 Patient returns for post hospital followup. Patient was admitted January 24-27th 2014 for COPD, exacerbation. She was treated with IV antibiotics, nebulized bronchodilators, and IV steroids.  She was discharged on a steroid taper, along with Levaquin.  Since discharge. She has had  some improvement however, continues to have productive cough, with some thick, yellow. Mucus. Unfortunately patient has restarted smoking. Smoking cessation education was  given. Chest x-ray today shows no acute process Patient denies any hemoptysis, orthopnea, PND, or leg swelling.   03/11/13 -- COPD, OSA on CPAP, chronic cough, tobacco use. Admitted 3/6 - 3/11 for AE-COPD. She feels better. She is having some nasal congestion, on loratadine + nasal steroid. ENT saw her and increased her PPI. She is wearing her CPAP.   ER Follow up 06/12/13 --  pt reports breathing is doing much better since last ED visit 7/10 w SOB given pred and abx but did not take abx-- wearing CPAP 1-2 nights per week-- states it makes her feel claustrophobic-- denies any other concerns at this time Cough and congestion have improved. Shortness, of breath is also decreased. Patient denies any hemoptysis, orthopnea, PND, or increased leg swelling  ROV 12/08/13 -- COPD, OSA on CPAP, chronic cough, tobacco use.  She underwent PFT on 11/16/13 > severe AFL, no BD response, superimposed restriction, decreased DLCO.  Her FEV1 is stable from '09, FVC is decrease slightly. She is upset today because she was unable to be seen on 1/5 when she was here for PFT and was sick. She is also upset that I didn't come to see her when she was admitted to hospital since last Summer. She is still smoking a few cigarettes a day. Remains on Spiriva, symbicort, duonebs prn. Her O2 is set at  2L/min. She is wearing CPAP reliably   ROV 07/06/14 -- follows for severe COPD, OSA, cough, continued tobacco. She was seen by Dr Annamaria Boots in 6/15, and then again at the ED 8/24 (yesterday) for dyspnea, no chest tightness, no cough. She was given solumedrol and a script for a pred taper > she hasn't filled it.  She has taken her spiriva and symbicort. She is reliable with her CPAP mask.  She is on omeprazole and loratadine.    ROV 08/03/14 -- severe COPD, OSA on CPAP, cough, continued tobacco use, about 3 cigarettes daily. Also some component UA disease. She continues to cough and have midfollow up for  sternal chest pain. She is reliable with her  omeprazole 40.  She has been using albuterol.   ROV 01/10/15 -- follow up for severe COPD OSA, cough and tobacco use.  She follows up today following treatment for an apparent AE of her COPD and UA disease. She is doing a lot of coughing, causing mid abd and back soreness. She c/o some baseline soreness and chest tightness. She has loratdine, has not been nasal steroid for several weeks. She is on omeprazole.   ROV 04/18/15 -- follow-up visit for COPD/asthma, chronic cough and upper airway irritation, tobacco use, and obstructive sleep apnea on CPAP. She was admitted to the hospital 4/7-12 and then 5/19-20 with AE-COPD and L rib fractures respectively. She is smoking  up to 1/2 pk a day, often aroun 3-4 cig a day. She is using symbicort + spiriva.  She is using albuterol on a schedule as well qid. She is also on flonase bid, loratadine. She is not wearing her CPAP, has not worn in in a month.  Feels close to her usual baseline. Still having L rib pain.   I reviewed the hospital notes from April and May..    ROS  See history of present illness   Objective:   Physical Exam Filed Vitals:   04/18/15 1134  BP: 142/58  Pulse: 98  Weight: 217 lb (98.431 kg)  SpO2: 91%    Gen: Obese, in no distress,  normal affect  ENT: No lesions,  mouth clear,  oropharynx clear, no postnasal drip, hoarse voice  Neck: No JVD, no TMG, no carotid bruits, loud exp stridor and hoarse voice  Lungs: No use of accessory muscles, distant, no wheezes   Cardiovascular: RRR, heart sounds normal, no murmur or gallops, no peripheral edema  Musculoskeletal: No deformities, no cyanosis or clubbing  Neuro: alert, non focal  Skin: Warm, no lesions or rashes, old scars on B UE's   Assessment & Plan:   COPD (chronic obstructive pulmonary disease) I unfortunately believe that we are at an impasse and will continue to have symptoms and flares as long she continues to smoke. I will continue her current inhaled medications.  Continue to treat her allergic rhinitis. We discussed smoking cessation in detail today. He has tried nicotine replacement without success. I believe this can be an adjunct but she needs other support. We will arrange for her to go to smoking cessation classes   Obstructive sleep apnea She needs to restart her CPAP. We discussed this in detail today. Some pain in her right nostril has been keeping her from wearing nasal pillows. We will temporarily stop her Flonase, try nasal saline. Try to restart the nasal pillows

## 2015-04-22 ENCOUNTER — Telehealth: Payer: Self-pay | Admitting: Emergency Medicine

## 2015-04-22 NOTE — Telephone Encounter (Signed)
Spoke with pt, states she has called the smoking cessation classes number that RB gave her 3X since Monday, has not been set up with one since and is upset about this.  Pt is wanting other resources for smoking cessation.    RB please advise.  Thanks!

## 2015-04-23 ENCOUNTER — Emergency Department (HOSPITAL_COMMUNITY): Payer: Medicaid Other

## 2015-04-23 ENCOUNTER — Emergency Department (HOSPITAL_COMMUNITY)
Admission: EM | Admit: 2015-04-23 | Discharge: 2015-04-23 | Disposition: A | Discharge status: Home / Self Care | Payer: Medicaid Other | Attending: Emergency Medicine | Admitting: Emergency Medicine

## 2015-04-23 ENCOUNTER — Encounter (HOSPITAL_COMMUNITY): Payer: Self-pay | Admitting: *Deleted

## 2015-04-23 DIAGNOSIS — R05 Cough: Secondary | ICD-10-CM

## 2015-04-23 DIAGNOSIS — G4733 Obstructive sleep apnea (adult) (pediatric): Secondary | ICD-10-CM | POA: Diagnosis not present

## 2015-04-23 DIAGNOSIS — Z8701 Personal history of pneumonia (recurrent): Secondary | ICD-10-CM | POA: Insufficient documentation

## 2015-04-23 DIAGNOSIS — Z862 Personal history of diseases of the blood and blood-forming organs and certain disorders involving the immune mechanism: Secondary | ICD-10-CM | POA: Diagnosis not present

## 2015-04-23 DIAGNOSIS — J441 Chronic obstructive pulmonary disease with (acute) exacerbation: Secondary | ICD-10-CM | POA: Diagnosis not present

## 2015-04-23 DIAGNOSIS — I10 Essential (primary) hypertension: Secondary | ICD-10-CM | POA: Insufficient documentation

## 2015-04-23 DIAGNOSIS — F419 Anxiety disorder, unspecified: Secondary | ICD-10-CM | POA: Diagnosis not present

## 2015-04-23 DIAGNOSIS — K219 Gastro-esophageal reflux disease without esophagitis: Secondary | ICD-10-CM | POA: Diagnosis not present

## 2015-04-23 DIAGNOSIS — Z8781 Personal history of (healed) traumatic fracture: Secondary | ICD-10-CM | POA: Diagnosis not present

## 2015-04-23 DIAGNOSIS — Z9889 Other specified postprocedural states: Secondary | ICD-10-CM | POA: Insufficient documentation

## 2015-04-23 DIAGNOSIS — R110 Nausea: Secondary | ICD-10-CM | POA: Diagnosis not present

## 2015-04-23 DIAGNOSIS — E119 Type 2 diabetes mellitus without complications: Secondary | ICD-10-CM | POA: Insufficient documentation

## 2015-04-23 DIAGNOSIS — R053 Chronic cough: Secondary | ICD-10-CM

## 2015-04-23 DIAGNOSIS — Z87891 Personal history of nicotine dependence: Secondary | ICD-10-CM | POA: Insufficient documentation

## 2015-04-23 DIAGNOSIS — R197 Diarrhea, unspecified: Secondary | ICD-10-CM | POA: Diagnosis not present

## 2015-04-23 DIAGNOSIS — Z8541 Personal history of malignant neoplasm of cervix uteri: Secondary | ICD-10-CM | POA: Diagnosis not present

## 2015-04-23 DIAGNOSIS — Z7951 Long term (current) use of inhaled steroids: Secondary | ICD-10-CM | POA: Insufficient documentation

## 2015-04-23 DIAGNOSIS — R0789 Other chest pain: Secondary | ICD-10-CM | POA: Insufficient documentation

## 2015-04-23 DIAGNOSIS — Z9981 Dependence on supplemental oxygen: Secondary | ICD-10-CM | POA: Insufficient documentation

## 2015-04-23 MED ORDER — METHOCARBAMOL 500 MG PO TABS: 500.0000 mg | ORAL_TABLET | Freq: Two times a day (BID) | ORAL | Status: DC

## 2015-04-23 MED ORDER — HYDROMORPHONE HCL 1 MG/ML IJ SOLN
1.0000 mg | Freq: Once | INTRAMUSCULAR | Status: AC
  Administered 2015-04-23: 1 mg via INTRAVENOUS
  Filled 2015-04-23: qty 1

## 2015-04-23 MED ORDER — DIAZEPAM 5 MG/ML IJ SOLN
5.0000 mg | Freq: Once | INTRAMUSCULAR | Status: AC
  Administered 2015-04-23: 5 mg via INTRAVENOUS
  Filled 2015-04-23: qty 2

## 2015-04-23 NOTE — ED Notes (Signed)
Bed: WA17 Expected date:  Expected time:  Means of arrival:  Comments: ems 

## 2015-04-23 NOTE — ED Notes (Signed)
Pt is on 3L of oxygen at home, upon discharge pt's ride sts they don't have her O2 tank, advised pt of importance of being on O2 and offered to call [facility] for transport, however pt refused stating she doesn't want extra charges and "It's just a short ride, I'll be fine".

## 2015-04-23 NOTE — ED Notes (Signed)
Per EMS pt coming from home with c/o rib pain, cough, COPD exacerbation and anxiety. Pt sts already has fractured left rib and sts this morning after coughing she felt pop and thinks she fractured more ribs. Pt is moaning and crying on arrival.

## 2015-04-23 NOTE — ED Provider Notes (Signed)
CSN: [id number]     Arrival date & time 04/23/15  1033 History   First MD Initiated Contact with Patient 04/23/15 1053     Chief Complaint  Patient presents with  . Cough  . COPD  . Rib Fracture  . Anxiety     (Consider location/radiation/quality/duration/timing/severity/associated sxs/prior Treatment) HPI  Pt is a 59yo female with hx of COPD and Left rib fracture on 03/31/15 from coughing, presenting to ED with c/o worsening Left sided chest pain after she coughed this morning and felt a new "pop"  Pain is sharp and stabbing, worse with palpation, cough, and deep breathing.  Pt concerned she fractured another rib.  Reports nausea and 1 episode of diarrhea but no vomiting.  She has taken percocet at home w/o relief.  Pt is dependent on 3L O2 all the time.     Past Medical History  Diagnosis Date  . Hypertension   . Anxiety   . Emphysema   . COPD (chronic obstructive pulmonary disease)   . OSA on CPAP     CPAP at night   . Dizziness     pt believes this is motion sickness or vertigo  . GERD (gastroesophageal reflux disease)   . Headache(784.0)   . Cancer 1990    cervical   . Anemia   . Morbid obesity   . Sickle cell trait   . Hx of cardiac catheterization     a. LHC at [facility] in [place], [place] 09/2008:  Normal coronary arteries EF 70%.  . Tobacco abuse     a. up to 3ppd from age 66 to 49, now 1/4 ppd (01/2013)  . Shortness of breath   . Pneumonia   . Diabetes mellitus without complication    Past Surgical History  Procedure Laterality Date  . Tubal ligation    . Cardiac catheterization    . Colonoscopy  09/05/2012    Procedure: COLONOSCOPY;  Surgeon: Beryle Beams, MD;  Location: [facility];  Service: Endoscopy;  Laterality: N/A;   Family History  Problem Relation Age of Onset  . Lung cancer Paternal Aunt   . Lung cancer Paternal Grandfather   . Other Father     unaware of father's medical history  . Diabetes Mother     alive @ 86  . Myasthenia gravis Mother   .  Other      multiple siblings a&w.   History  Substance Use Topics  . Smoking status: Former Smoker -- 0.50 packs/day for 36 years    Types: Cigarettes  . Smokeless tobacco: Never Used     Comment: Approx 90 pk-yrs (up to 3ppd until ~ 2009). Smoking 3 cigs per day now.  . Alcohol Use: No   OB History    No data available     Review of Systems  Constitutional: Negative for fever, chills, appetite change and fatigue.  Respiratory: Positive for cough ( chronic) and shortness of breath. Stridor:  chronic.   Cardiovascular: Positive for chest pain (Left side). Negative for palpitations and leg swelling.  Gastrointestinal: Negative for nausea, vomiting, abdominal pain and diarrhea.  All other systems reviewed and are negative.     Allergies  Acetaminophen  Home Medications   Prior to Admission medications   Medication Sig Start Date End Date Taking? Authorizing Provider  albuterol (PROVENTIL HFA;VENTOLIN HFA) 108 (90 BASE) MCG/ACT inhaler Inhale 2 puffs into the lungs every 4 (four) hours as needed for wheezing. 03/11/13  Yes Collene Gobble, MD  albuterol (PROVENTIL) (2.5 MG/3ML) 0.083% nebulizer solution Take 3 mLs (2.5 mg total) by nebulization 4 (four) times daily. DX J44.9 11/22/14  Yes Collene Gobble, MD  allopurinol (ZYLOPRIM) 100 MG tablet Take 100 mg by mouth daily.   Yes Historical Provider, MD  ALPRAZolam Duanne Moron) 0.5 MG tablet Take 1 tablet (0.5 mg total) by mouth 2 (two) times daily as needed for anxiety. 02/22/15  Yes Bonnielee Haff, MD  budesonide-formoterol [facility]) 160-4.5 MCG/ACT inhaler Inhale 2 puffs into the lungs 2 (two) times daily.   Yes Historical Provider, MD  fluticasone (FLONASE) 50 MCG/ACT nasal spray Place 2 sprays into both nostrils 2 (two) times daily. 01/11/15  Yes Collene Gobble, MD  glipiZIDE (GLUCOTROL) 10 MG tablet Take 10 mg by mouth daily before breakfast.   Yes Historical Provider, MD  HYDROcodone-acetaminophen (NORCO) 7.5-325 MG per tablet Take 1  tablet by mouth every 4 (four) hours as needed for moderate pain or severe pain. 04/01/15  Yes Thurnell Lose, MD  hyoscyamine (ANASPAZ) 0.125 MG TBDP disintergrating tablet Place 0.125 mg under the tongue 4 (four) times daily.   Yes Historical Provider, MD  ibuprofen (ADVIL,MOTRIN) 800 MG tablet Take 800 mg by mouth every 8 (eight) hours as needed for moderate pain.    Yes Historical Provider, MD  ipratropium (ATROVENT) 0.02 % nebulizer solution Take 2.5 mLs (0.5 mg total) by nebulization every 6 (six) hours as needed for wheezing or shortness of breath. 04/01/15  Yes Collene Gobble, MD  Linaclotide (LINZESS) 145 MCG CAPS capsule Take 145 mcg by mouth every morning.   Yes Historical Provider, MD  loratadine (CLARITIN) 10 MG tablet Take 10 mg by mouth daily.   Yes Historical Provider, MD  pantoprazole (PROTONIX) 40 MG tablet Take 40 mg by mouth daily. 01/19/15  Yes Historical Provider, MD  pregabalin (LYRICA) 75 MG capsule Take 75 mg by mouth 2 (two) times daily.   Yes Historical Provider, MD  tiotropium (SPIRIVA) 18 MCG inhalation capsule Place 18 mcg into inhaler and inhale daily.   Yes Historical Provider, MD  triamterene-hydrochlorothiazide (DYAZIDE) 37.5-25 MG per capsule Take 1 capsule by mouth daily.   Yes Historical Provider, MD  zolpidem (AMBIEN) 10 MG tablet Take 10 mg by mouth at bedtime.   Yes Historical Provider, MD  methocarbamol (ROBAXIN) 500 MG tablet Take 1 tablet (500 mg total) by mouth 2 (two) times daily. 04/23/15   Noland Fordyce, PA-C   BP 126/74 mmHg  Pulse 92  Temp(Src) 98.3 F (36.8 C) (Oral)  Resp 20  SpO2 97%  LMP 06/04/2000 (LMP Unknown) Physical Exam  Constitutional: She appears well-developed and well-nourished. No distress.  HENT:  Head: Normocephalic and atraumatic.  Eyes: Conjunctivae are normal. No scleral icterus.  Neck: Normal range of motion.  Cardiovascular: Normal rate, regular rhythm and normal heart sounds.   Pulmonary/Chest: Effort normal and breath  sounds normal. No respiratory distress. She has no wheezes. She has no rales. She exhibits tenderness.  Lungs: CTAB. Tenderness to Left lateral ribs, no crepitus. No flail chest.   Abdominal: Soft. Bowel sounds are normal. She exhibits no distension and no mass. There is no tenderness. There is no rebound and no guarding.  Musculoskeletal: Normal range of motion.  Neurological: She is alert.  Skin: Skin is warm and dry. She is not diaphoretic.  Nursing note and vitals reviewed.   ED Course  Procedures (including critical care time) Labs Review Labs Reviewed - No data to display  Imaging Review Dg Ribs  Unilateral W/chest Left  04/23/2015   CLINICAL DATA:  Shortness of breath and left-sided rib pain. Cough. Smoker with COPD.  EXAM: LEFT RIBS AND CHEST - 3+ VIEW  COMPARISON:  04/01/2015  FINDINGS: Frontal view of the chest demonstrates midline trachea. Mild cardiomegaly with transverse aortic atherosclerosis. No pleural effusion or pneumothorax. Left apical pleural thickening. Chronic interstitial thickening. Left greater than right bibasilar atelectasis.  Five views of left-sided ribs. Remote left eighth rib fracture. No new fractures.  IMPRESSION: 1. Interstitial thickening, most consistent chronic bronchitis. 2. Bibasilar atelectasis, similar. 3. Remote left eighth rib fracture. No acute rib fracture identified. 4.  Aortic atherosclerosis.   Electronically Signed   By: Abigail Miyamoto M.D.   On: 04/23/2015 11:57     EKG Interpretation None      MDM   Final diagnoses:  Left-sided chest wall pain  Chronic cough  Hx of fracture of rib   Pt with hx of COPD and Left 8th Rib fracture from coughing last month, c/o exacerbation of Left side chest wall pain. VItals: WNL, O2 Sat 93% on 2L O2.  Pt chronically on Home O2.   CXR: no acute findings, remote Left 8th rib fracture.  Pt was given pain medication in ED, states she feels comfortable being discharged home.  Rx: robaxin. Home care  instructions provided. Advised to f/u with PCP next week for recheck of symptoms if not improving.  Return precautions provided. Pt verbalized understanding and agreement with tx plan.    Noland Fordyce, PA-C 04/23/15 1456  Pamella Pert, MD 04/24/15 1052

## 2015-04-25 NOTE — Telephone Encounter (Signed)
I will see if there are any other resources that we can take advantage of. In the meantime have her go ahead and call the 1-800-QUIT-NOW number as an initial support system to help with cessation.

## 2015-04-25 NOTE — Telephone Encounter (Signed)
Called made pt aware of below. Nothing further needed 

## 2015-05-03 ENCOUNTER — Telehealth: Payer: Self-pay | Admitting: Emergency Medicine

## 2015-05-03 MED ORDER — BUDESONIDE-FORMOTEROL FUMARATE 160-4.5 MCG/ACT IN AERO: 2.0000 | INHALATION_SPRAY | Freq: Two times a day (BID) | RESPIRATORY_TRACT | Status: DC

## 2015-05-03 MED ORDER — ALBUTEROL SULFATE (2.5 MG/3ML) 0.083% IN NEBU: 2.5000 mg | INHALATION_SOLUTION | Freq: Four times a day (QID) | RESPIRATORY_TRACT | Status: DC

## 2015-05-03 MED ORDER — TIOTROPIUM BROMIDE MONOHYDRATE 18 MCG IN CAPS: 18.0000 ug | ORAL_CAPSULE | Freq: Every day | RESPIRATORY_TRACT | Status: DC

## 2015-05-03 MED ORDER — IPRATROPIUM BROMIDE 0.02 % IN SOLN: 0.5000 mg | Freq: Four times a day (QID) | RESPIRATORY_TRACT | Status: DC | PRN

## 2015-05-03 NOTE — Telephone Encounter (Signed)
Spoke with pt and advised that rx requested were sent to [facility] per pt request.

## 2015-05-04 ENCOUNTER — Ambulatory Visit: Payer: Medicaid Other

## 2015-05-11 ENCOUNTER — Telehealth: Payer: Self-pay | Admitting: Emergency Medicine

## 2015-05-11 NOTE — Telephone Encounter (Signed)
Per 04/18/15 OV; Patient Instructions       Please continue your current inhaled medications as you're taking them Try stopping your Flonase for 3 days to see if your nose discomfort improves Try using nasal saline spray to moisten your nostrils and prevent irritation and pain Try to restart your CPAP at night We will get you information about stopping smoking, and classes to help you.  Follow with Dr Lamonte Sakai in 2 months or sooner if you have any problems.  --  Called spoke with Legrand Como w/ [facility]. He reports pt told them we were going to call in a nasal spray. I see she has flonase on her medication list but he was unsure.  I advised we will call pt to figure out.  Called pt NA. Line rang numerous times. WCB

## 2015-05-12 MED ORDER — FLUTICASONE PROPIONATE 50 MCG/ACT NA SUSP: 2.0000 | Freq: Two times a day (BID) | NASAL | Status: DC

## 2015-05-12 NOTE — Telephone Encounter (Signed)
lmtcb for pt.  

## 2015-05-12 NOTE — Telephone Encounter (Signed)
Pt returned call [phone] °

## 2015-05-12 NOTE — Telephone Encounter (Signed)
Spoke with pt. States that she is needing a refill on Flonase. This has been sent to [facility]. Nothing further was needed.

## 2015-05-18 ENCOUNTER — Ambulatory Visit: Payer: Medicaid Other

## 2015-06-09 ENCOUNTER — Emergency Department (HOSPITAL_COMMUNITY)
Admission: EM | Admit: 2015-06-09 | Discharge: 2015-06-10 | Disposition: A | Discharge status: Home / Self Care | Payer: Medicaid Other | Attending: Emergency Medicine | Admitting: Emergency Medicine

## 2015-06-09 ENCOUNTER — Emergency Department (HOSPITAL_COMMUNITY): Payer: Medicaid Other

## 2015-06-09 ENCOUNTER — Encounter (HOSPITAL_COMMUNITY): Payer: Self-pay | Admitting: Oncology

## 2015-06-09 DIAGNOSIS — F419 Anxiety disorder, unspecified: Secondary | ICD-10-CM | POA: Diagnosis not present

## 2015-06-09 DIAGNOSIS — Z9981 Dependence on supplemental oxygen: Secondary | ICD-10-CM | POA: Diagnosis not present

## 2015-06-09 DIAGNOSIS — E119 Type 2 diabetes mellitus without complications: Secondary | ICD-10-CM | POA: Insufficient documentation

## 2015-06-09 DIAGNOSIS — R079 Chest pain, unspecified: Secondary | ICD-10-CM | POA: Diagnosis present

## 2015-06-09 DIAGNOSIS — Z87891 Personal history of nicotine dependence: Secondary | ICD-10-CM | POA: Insufficient documentation

## 2015-06-09 DIAGNOSIS — Z862 Personal history of diseases of the blood and blood-forming organs and certain disorders involving the immune mechanism: Secondary | ICD-10-CM | POA: Insufficient documentation

## 2015-06-09 DIAGNOSIS — Z8541 Personal history of malignant neoplasm of cervix uteri: Secondary | ICD-10-CM | POA: Diagnosis not present

## 2015-06-09 DIAGNOSIS — J441 Chronic obstructive pulmonary disease with (acute) exacerbation: Secondary | ICD-10-CM | POA: Insufficient documentation

## 2015-06-09 DIAGNOSIS — I10 Essential (primary) hypertension: Secondary | ICD-10-CM | POA: Diagnosis not present

## 2015-06-09 DIAGNOSIS — Z79899 Other long term (current) drug therapy: Secondary | ICD-10-CM | POA: Diagnosis not present

## 2015-06-09 DIAGNOSIS — Z7951 Long term (current) use of inhaled steroids: Secondary | ICD-10-CM | POA: Diagnosis not present

## 2015-06-09 DIAGNOSIS — X58XXXD Exposure to other specified factors, subsequent encounter: Secondary | ICD-10-CM | POA: Diagnosis not present

## 2015-06-09 DIAGNOSIS — Z9889 Other specified postprocedural states: Secondary | ICD-10-CM | POA: Diagnosis not present

## 2015-06-09 DIAGNOSIS — Z8701 Personal history of pneumonia (recurrent): Secondary | ICD-10-CM | POA: Insufficient documentation

## 2015-06-09 DIAGNOSIS — R52 Pain, unspecified: Secondary | ICD-10-CM

## 2015-06-09 DIAGNOSIS — S2242XD Multiple fractures of ribs, left side, subsequent encounter for fracture with routine healing: Secondary | ICD-10-CM | POA: Insufficient documentation

## 2015-06-09 DIAGNOSIS — G4733 Obstructive sleep apnea (adult) (pediatric): Secondary | ICD-10-CM | POA: Diagnosis not present

## 2015-06-09 DIAGNOSIS — S2232XA Fracture of one rib, left side, initial encounter for closed fracture: Secondary | ICD-10-CM

## 2015-06-09 DIAGNOSIS — K219 Gastro-esophageal reflux disease without esophagitis: Secondary | ICD-10-CM | POA: Insufficient documentation

## 2015-06-09 LAB — BASIC METABOLIC PANEL
Anion gap: 5 (ref 5–15)
BUN: 8 mg/dL (ref 6–20)
CALCIUM: 9 mg/dL (ref 8.9–10.3)
CHLORIDE: 109 mmol/L (ref 101–111)
CO2: 23 mmol/L (ref 22–32)
CREATININE: 0.71 mg/dL (ref 0.44–1.00)
GFR calc Af Amer: >60 mL/min (ref >60)
GFR calc non Af Amer: >60 mL/min (ref >60)
Glucose, Bld: 101 mg/dL — ABNORMAL HIGH (ref 65–99)
POTASSIUM: 3.4 mmol/L — AB (ref 3.5–5.1)
SODIUM: 137 mmol/L (ref 135–145)

## 2015-06-09 LAB — CBC
HCT: 41.6 % (ref 36.0–46.0)
Hemoglobin: 13.7 g/dL (ref 12.0–15.0)
MCH: 32.2 pg (ref 26.0–34.0)
MCHC: 32.9 g/dL (ref 30.0–36.0)
MCV: 97.7 fL (ref 78.0–100.0)
Platelets: 189 10*3/uL (ref 150–400)
RBC: 4.26 MIL/uL (ref 3.87–5.11)
RDW: 13.6 % (ref 11.5–15.5)
WBC: 11.2 10*3/uL — ABNORMAL HIGH (ref 4.0–10.5)

## 2015-06-09 NOTE — ED Notes (Addendum)
Per EMS pt presents d/t left sided chest wall pain that is worse with inhalation.  Pt has hx of fx rib on left side in April of 2016 however has not had pain from that in some time.  325 mg ASA given by EMS en route.  Pt is on 2L O2 chronically.

## 2015-06-09 NOTE — ED Notes (Signed)
Bed: [facility] Expected date:  Expected time:  Means of arrival:  Comments: EMS  

## 2015-06-09 NOTE — ED Provider Notes (Signed)
CSN: [id number]     Arrival date & time 06/09/15  2129 History  This chart was scribed for Rolland Porter, MD by Meriel Pica, ED Scribe. This patient was seen in room WA08/WA08 and the patient's care was started 11:35 PM.   Chief Complaint  Patient presents with  . Chest Pain    chest wall pain/pain w/ inhalation.   The history is provided by the patient. No language interpreter was used.    HPI Comments: Aniaya Bacha is a 59 y.o. female, with a PMhx of HTN, emphysema, COPD, GERD, and DM, who presents to the Emergency Department complaining of sudden onset, sharp, shooting pain in her left-sided rib region that radiates up to her left axillary area only upon deep breathing onset 3 hours ago. She states the pain awakened her from sleep. Pt reports onset of the pain with using the restroom earlier this evening. Pt associates SOB and sneezing. Her pain is exacerbated by coughing, sneezing, and deep breaths. She states she has felt mild discomfort for 3 days in the left rib area but the pain progressively worsened this evening prompting ED evaluation. She has taken 800mg  ibuprofen and 7.5-325mg  hydrocodone with no relief tonight. She notes a PMhx of a broken rib in a different location in April but denies the pain to be similar to pain experienced with that fracture. Pt is a current smoker. She is regularly followed by her PCP. Denies coughing more than usual, LE edema, or fevers. Additionally denies experiencing any pain similar to the current complaint in the past.    PCP Dr Jonelle Sidle Pulmonary Dr Malvin Johns  Past Medical History  Diagnosis Date  . Hypertension   . Anxiety   . Emphysema   . COPD (chronic obstructive pulmonary disease)   . OSA on CPAP     CPAP at night   . Dizziness     pt believes this is motion sickness or vertigo  . GERD (gastroesophageal reflux disease)   . Headache(784.0)   . Cancer 1990    cervical   . Anemia   . Morbid obesity   . Sickle cell trait   . Hx of cardiac  catheterization     a. LHC at [facility] in [place], [place] 09/2008:  Normal coronary arteries EF 70%.  . Tobacco abuse     a. up to 3ppd from age 89 to 56, now 1/4 ppd (01/2013)  . Shortness of breath   . Pneumonia   . Diabetes mellitus without complication    Past Surgical History  Procedure Laterality Date  . Tubal ligation    . Cardiac catheterization    . Colonoscopy  09/05/2012    Procedure: COLONOSCOPY;  Surgeon: Beryle Beams, MD;  Location: [facility];  Service: Endoscopy;  Laterality: N/A;   Family History  Problem Relation Age of Onset  . Lung cancer Paternal Aunt   . Lung cancer Paternal Grandfather   . Other Father     unaware of father's medical history  . Diabetes Mother     alive @ 59  . Myasthenia gravis Mother   . Other      multiple siblings a&w.   History  Substance Use Topics  . Smoking status: Former Smoker -- 0.50 packs/day for 36 years    Types: Cigarettes  . Smokeless tobacco: Never Used     Comment: Approx 90 pk-yrs (up to 3ppd until ~ 2009). Smoking 3 cigs per day now.  . Alcohol Use: No   Smokes 1/5  ppd Lives at home Uses oxygen 2 lpm [place]  OB History    No data available     Review of Systems  Constitutional: Negative for fever.  HENT: Positive for sneezing.   Respiratory: Positive for shortness of breath. Negative for cough.   Cardiovascular: Positive for chest pain. Negative for leg swelling.  All other systems reviewed and are negative.   Allergies  Acetaminophen  Home Medications   Prior to Admission medications   Medication Sig Start Date End Date Taking? Authorizing Provider  albuterol (PROVENTIL HFA;VENTOLIN HFA) 108 (90 BASE) MCG/ACT inhaler Inhale 2 puffs into the lungs every 4 (four) hours as needed for wheezing. 03/11/13  Yes Collene Gobble, MD  albuterol (PROVENTIL) (2.5 MG/3ML) 0.083% nebulizer solution Take 3 mLs (2.5 mg total) by nebulization 4 (four) times daily. DX J44.9 Patient taking differently: Take 2.5 mg by  nebulization every 6 (six) hours as needed for wheezing or shortness of breath. DX J44.9 05/03/15  Yes Collene Gobble, MD  allopurinol (ZYLOPRIM) 100 MG tablet Take 100 mg by mouth daily.   Yes Historical Provider, MD  ALPRAZolam Duanne Moron) 0.5 MG tablet Take 1 tablet (0.5 mg total) by mouth 2 (two) times daily as needed for anxiety. 02/22/15  Yes Bonnielee Haff, MD  budesonide-formoterol [facility]) 160-4.5 MCG/ACT inhaler Inhale 2 puffs into the lungs 2 (two) times daily. 05/03/15  Yes Collene Gobble, MD  fluticasone (FLONASE) 50 MCG/ACT nasal spray Place 2 sprays into both nostrils 2 (two) times daily. Patient taking differently: Place 2 sprays into both nostrils daily.  05/12/15  Yes Collene Gobble, MD  glipiZIDE (GLUCOTROL) 10 MG tablet Take 10 mg by mouth daily before breakfast.   Yes Historical Provider, MD  HYDROcodone-acetaminophen (NORCO) 7.5-325 MG per tablet Take 1 tablet by mouth every 4 (four) hours as needed for moderate pain or severe pain. 04/01/15  Yes Thurnell Lose, MD  hyoscyamine (ANASPAZ) 0.125 MG TBDP disintergrating tablet Place 0.125 mg under the tongue daily.    Yes Historical Provider, MD  ibuprofen (ADVIL,MOTRIN) 800 MG tablet Take 800 mg by mouth every 8 (eight) hours as needed for moderate pain.    Yes Historical Provider, MD  ipratropium (ATROVENT) 0.02 % nebulizer solution Take 2.5 mLs (0.5 mg total) by nebulization every 6 (six) hours as needed for wheezing or shortness of breath. 05/03/15  Yes Collene Gobble, MD  Linaclotide (LINZESS) 145 MCG CAPS capsule Take 145 mcg by mouth every morning.   Yes Historical Provider, MD  loratadine (CLARITIN) 10 MG tablet Take 10 mg by mouth daily.   Yes Historical Provider, MD  pantoprazole (PROTONIX) 40 MG tablet Take 40 mg by mouth daily. 01/19/15  Yes Historical Provider, MD  pregabalin (LYRICA) 75 MG capsule Take 75 mg by mouth 2 (two) times daily.   Yes Historical Provider, MD  tiotropium (SPIRIVA) 18 MCG inhalation capsule Place 1  capsule (18 mcg total) into inhaler and inhale daily. 05/03/15  Yes Collene Gobble, MD  triamterene-hydrochlorothiazide (DYAZIDE) 37.5-25 MG per capsule Take 1 capsule by mouth daily.   Yes Historical Provider, MD  zolpidem (AMBIEN) 10 MG tablet Take 10 mg by mouth at bedtime.   Yes Historical Provider, MD  methocarbamol (ROBAXIN) 500 MG tablet Take 1 or 2 po Q 6hrs for pain 06/10/15   Rolland Porter, MD  oxyCODONE (ROXICODONE) 5 MG immediate release tablet Take 1 tablet (5 mg total) by mouth every 6 (six) hours as needed for severe pain. 06/10/15  Rolland Porter, MD   BP 125/64 mmHg  Pulse 86  Temp(Src) 97.6 F (36.4 C) (Oral)  Resp 24  SpO2 92%  LMP 06/04/2000 (LMP Unknown)  Vital signs normal   Physical Exam  Constitutional: She is oriented to person, place, and time. She appears well-developed and well-nourished.  Non-toxic appearance. She does not appear ill. No distress.  HENT:  Head: Normocephalic and atraumatic.  Right Ear: External ear normal.  Left Ear: External ear normal.  Nose: Nose normal. No mucosal edema or rhinorrhea.  Mouth/Throat: Oropharynx is clear and moist and mucous membranes are normal. No dental abscesses or uvula swelling.  Eyes: Conjunctivae and EOM are normal. Pupils are equal, round, and reactive to light.  Neck: Normal range of motion and full passive range of motion without pain. Neck supple.  Cardiovascular: Normal rate, regular rhythm and normal heart sounds.  Exam reveals no gallop and no friction rub.   No murmur heard. Pulmonary/Chest: Effort normal. No respiratory distress. She has no wheezes. She has no rhonchi. She has no rales. She exhibits tenderness. She exhibits no crepitus.    Mildly diminished diffusely. Tender to lower rib margin in the left mid-axillary line.   Abdominal: Soft. Normal appearance and bowel sounds are normal. She exhibits no distension. There is no tenderness. There is no rebound and no guarding.  Musculoskeletal: Normal range of  motion. She exhibits no edema or tenderness.  Moves all extremities well.   Neurological: She is alert and oriented to person, place, and time. She has normal strength. No cranial nerve deficit.  Skin: Skin is warm, dry and intact. No rash noted. No erythema. No pallor.  Psychiatric: She has a normal mood and affect. Her speech is normal and behavior is normal. Her mood appears not anxious.  Nursing note and vitals reviewed.   ED Course  Procedures   Medications  oxyCODONE (Oxy IR/ROXICODONE) immediate release tablet 5 mg (5 mg Oral Given 06/10/15 0217)    DIAGNOSTIC STUDIES: Oxygen Saturation is 92% on RA, low by my interpretation.    COORDINATION OF CARE: 11:45 PM Discussed treatment plan which includes to order Xray of left ribs with pt. Discussed Chest Xray results with pt. Pt acknowledges and agrees to plan.   1:53 AM Discussed results of Xray with pt. She was started on oxycodone for her fracture rib pain.   Labs Review Results for orders placed or performed during the hospital encounter of 50/53/97  Basic metabolic panel  Result Value Ref Range   Sodium 137 135 - 145 mmol/L   Potassium 3.4 (L) 3.5 - 5.1 mmol/L   Chloride 109 101 - 111 mmol/L   CO2 23 22 - 32 mmol/L   Glucose, Bld 101 (H) 65 - 99 mg/dL   BUN 8 6 - 20 mg/dL   Creatinine, Ser 0.71 0.44 - 1.00 mg/dL   Calcium 9.0 8.9 - 10.3 mg/dL   GFR calc non Af Amer >60 >60 mL/min   GFR calc Af Amer >60 >60 mL/min   Anion gap 5 5 - 15  CBC  Result Value Ref Range   WBC 11.2 (H) 4.0 - 10.5 K/uL   RBC 4.26 3.87 - 5.11 MIL/uL   Hemoglobin 13.7 12.0 - 15.0 g/dL   HCT 41.6 36.0 - 46.0 %   MCV 97.7 78.0 - 100.0 fL   MCH 32.2 26.0 - 34.0 pg   MCHC 32.9 30.0 - 36.0 g/dL   RDW 13.6 11.5 - 15.5 %   Platelets 189  150 - 400 K/uL  CBG monitoring, ED  Result Value Ref Range   Glucose-Capillary 75 65 - 99 mg/dL   Laboratory interpretation all normal except for leukocytosis   Imaging Review Dg Chest 2 View  06/09/2015    CLINICAL DATA:  Left-sided chest pain radiating to the back for 3 days. Shortness of breath. History of left rib fracture April 2016.  EXAM: CHEST  2 VIEW  COMPARISON:  Most recent radiographs chest and left ribs 04/23/2015, chest CT 03/06/2015  FINDINGS: Borderline cardiomegaly, unchanged. Diffuse interstitium bronchial thickening, unchanged. Pulmonary vasculature is normal. No consolidation, pleural effusion, or pneumothorax. No acute osseous abnormalities are seen. The non left eighth rib fractures not well seen radiographically.  IMPRESSION: Stable interstitial thickening and borderline cardiomegaly. No acute pulmonary process. The known left rib fracture is not well seen.   Electronically Signed   By: Jeb Levering M.D.   On: 06/09/2015 23:14   Dg Ribs Unilateral Left  06/10/2015   CLINICAL DATA:  Left lower anterior rib pain this afternoon after several days coughing. Dyspnea.  EXAM: LEFT RIBS - 2 VIEW  COMPARISON:  06/09/2015  FINDINGS: There probably are subacute fractures of the left sixth and seventh ribs. No acute displaced fracture is evident. No pneumothorax or effusion.  IMPRESSION: Probable subacute left sixth and seventh rib fractures. No acute displaced fracture.   Electronically Signed   By: Andreas Newport M.D.   On: 06/10/2015 01:20     EKG Interpretation   Date/Time:  Thursday June 09 2015 22:09:49 EDT Ventricular Rate:  92 PR Interval:  146 QRS Duration: 78 QT Interval:  421 QTC Calculation: 521 R Axis:   70 Text Interpretation:  Sinus tachycardia Borderline T wave abnormalities  Prolonged QT interval Baseline wander in lead(s) II III aVF V1 V2 V3 V4 V5  V6 No significant change since last tracing 31 Mar 2015 Confirmed by Claiborne Stroble   MD-I, Saragrace Selke ([id number]) on 06/09/2015 11:11:05 PM      MDM   Final diagnoses:  Rib fractures, left, closed, initial encounter   Discharge Medication List as of 06/10/2015  2:02 AM    START taking these medications   Details  oxyCODONE  (ROXICODONE) 5 MG immediate release tablet Take 1 tablet (5 mg total) by mouth every 6 (six) hours as needed for severe pain., Starting 06/10/2015, Until Discontinued, Print        Plan discharge  Rolland Porter, MD, FACEP   I personally performed the services described in this documentation, which was scribed in my presence. The recorded information has been reviewed and considered.  Rolland Porter, MD, Barbette Or, MD 06/10/15 [phone]

## 2015-06-10 ENCOUNTER — Telehealth: Payer: Self-pay | Admitting: *Deleted

## 2015-06-10 ENCOUNTER — Emergency Department (HOSPITAL_COMMUNITY): Payer: Medicaid Other

## 2015-06-10 LAB — CBG MONITORING, ED: Glucose-Capillary: 75 mg/dL (ref 65–99)

## 2015-06-10 MED ORDER — METHOCARBAMOL 500 MG PO TABS: ORAL_TABLET | ORAL | Status: DC

## 2015-06-10 MED ORDER — OXYCODONE HCL 5 MG PO TABS
5.0000 mg | ORAL_TABLET | Freq: Once | ORAL | Status: AC
  Administered 2015-06-10: 5 mg via ORAL
  Filled 2015-06-10: qty 1

## 2015-06-10 MED ORDER — OXYCODONE HCL 5 MG PO TABS: 5.0000 mg | ORAL_TABLET | Freq: Four times a day (QID) | ORAL | Status: DC | PRN

## 2015-06-10 NOTE — Discharge Instructions (Signed)
Take deep breaths every 30 minutes to prevent collapsing of your lung which will make it easier for you to develop pneumonia. Recheck if you get a fever, struggle to breathe or feel worse. Have Dr Jonelle Sidle check you next week.     Rib Fracture A rib fracture is a break or crack in one of the bones of the ribs. The ribs are like a cage that goes around your upper chest. A broken or cracked rib is often painful, but most do not cause other problems. Most rib fractures heal on their own in 1-3 months. HOME CARE  Avoid activities that cause pain to the injured area. Protect your injured area.  Slowly increase activity as told by your doctor.  Take medicine as told by your doctor.  Put ice on the injured area for the first 1-2 days after you have been treated or as told by your doctor.  Put ice in a plastic bag.  Place a towel between your skin and the bag.  Leave the ice on for 15-20 minutes at a time, every 2 hours while you are awake.  Do deep breathing as told by your doctor. You may be told to:  Take deep breaths many times a day.  Cough many times a day while hugging a pillow.  Use a device (incentive spirometer) to perform deep breathing many times a day.  Drink enough fluids to keep your pee (urine) clear or pale yellow.   Do not wear a rib belt or binder. These do not allow you to breathe deeply. GET HELP RIGHT AWAY IF:   You have a fever.  You have trouble breathing.   You cannot stop coughing.  You cough up thick or bloody spit (mucus).   You feel sick to your stomach (nauseous), throw up (vomit), or have belly (abdominal) pain.   Your pain gets worse and medicine does not help.  MAKE SURE YOU:   Understand these instructions.  Will watch your condition.  Will get help right away if you are not doing well or get worse. Document Released: 08/07/2008 Document Revised: 02/23/2013 Document Reviewed: 12/31/2012 [facility] Patient Information 2015 [place],  [place]. This information is not intended to replace advice given to you by your health care provider. Make sure you discuss any questions you have with your health care provider.

## 2015-06-10 NOTE — Telephone Encounter (Signed)
[facility] called related to pt presenting Rx: oxyCODONE (ROXICODONE) 5 MG immediate release tablet 06/10/15 -- Rolland Porter, MD Take 1 tablet (5 mg total) by mouth every 6 (six) hours as needed for severe pain today after filling Rx: HYDROcodone-acetaminophen (NORCO) 7.5-325 MG per tablet #30 06/09/2015 04/01/15 -- Thurnell Lose, MD Take 1 tablet by mouth every 4 (four) hours as needed for moderate pain or severe pain 3 days ago.  NCM confirmed with EDP not to fill Rx presented today. Ericka Marcellus J. Clydene Laming, [place], [place], [employer] [phone]

## 2015-06-20 ENCOUNTER — Telehealth: Payer: Self-pay | Admitting: Emergency Medicine

## 2015-06-20 DIAGNOSIS — J449 Chronic obstructive pulmonary disease, unspecified: Secondary | ICD-10-CM

## 2015-06-20 DIAGNOSIS — J441 Chronic obstructive pulmonary disease with (acute) exacerbation: Secondary | ICD-10-CM

## 2015-06-20 NOTE — Telephone Encounter (Signed)
CXR order has been placed. Pt is aware. Nothing further was needed.

## 2015-06-21 ENCOUNTER — Ambulatory Visit (INDEPENDENT_AMBULATORY_CARE_PROVIDER_SITE_OTHER): Payer: Medicaid Other | Admitting: Emergency Medicine

## 2015-06-21 ENCOUNTER — Encounter: Payer: Self-pay | Admitting: Emergency Medicine

## 2015-06-21 VITALS — BP 132/74 | HR 88 | Ht 60.0 in | Wt 212.6 lb

## 2015-06-21 DIAGNOSIS — J410 Simple chronic bronchitis: Secondary | ICD-10-CM

## 2015-06-21 DIAGNOSIS — Z720 Tobacco use: Secondary | ICD-10-CM | POA: Diagnosis not present

## 2015-06-21 DIAGNOSIS — J309 Allergic rhinitis, unspecified: Secondary | ICD-10-CM

## 2015-06-21 DIAGNOSIS — G4733 Obstructive sleep apnea (adult) (pediatric): Secondary | ICD-10-CM

## 2015-06-21 MED ORDER — ESOMEPRAZOLE MAGNESIUM 40 MG PO CPDR: 40.0000 mg | DELAYED_RELEASE_CAPSULE | Freq: Every day | ORAL | Status: DC

## 2015-06-21 NOTE — Assessment & Plan Note (Signed)
No exacerbations since last time although she does continue to have cough. It was a sneeze that caused her to experience new rib fractures since her last visit.  our biggest intervention is bent to work on smoking cessation and she has made progress with this

## 2015-06-21 NOTE — Patient Instructions (Signed)
Please continue your medications as you are using them  Congratulations on decreasing your smoking! Please work hard on decreasing further. Our goal is to stop altogether.  Continue your CPAP every night Follow with Dr Lamonte Sakai in 3 months or sooner if you have any problems.

## 2015-06-21 NOTE — Addendum Note (Signed)
Addended by: Maurice March on: 06/21/2015 09:57 AM   Modules accepted: Orders

## 2015-06-21 NOTE — Assessment & Plan Note (Signed)
She has made great strides and decreasing her cigarettes. I congratulated her for this and we discussed strategies for her to for Stopping Completely. She Is Not Interested in Medications at This Time but We May Reconsider This in the Future to Being on Her Progress

## 2015-06-21 NOTE — Assessment & Plan Note (Signed)
Ranitidine and foot exam nasal spray. She occasionally has to stop the fluticasone due to nasal irritation and epistaxis

## 2015-06-21 NOTE — Assessment & Plan Note (Signed)
She has restarted her CPAP and notes that she does feel better since doing so. Encouraged her to continue

## 2015-06-21 NOTE — Progress Notes (Signed)
Subjective:    Patient ID: Stephanie Schultz, female    DOB: Feb 21, 1956, 59 y.o.   MRN: [id number]  HPI 59 yo smoker (90 pk-yrs, has cut down to about 2 cig a day), hx HTN, borderline DM, allergies. Dx with COPD at [facility] in [place]. She was started on O2 in ~2009. She had PFT within the last year. She remains fairly active as long as she wears her O2. She can walk about 30-40 feet, has to stop when shopping to rest. She has daily cough, productive of white phlegm. She has rare exacerbations, last was in Jan 2012.   ROV 07/05/11 -- COPD, OSA. Her data from [place] showed she needs CPAP 10. We ordered O2 thru [facility]. She continues to smoke 3 cigs a day. She has been coughing more, having more sputum - yellow/white sputum.  She is having more dyspnea, more wheezing x 3 weeks. She has gained 30+ lbs since coming to [place]. She has not been wearing her O2 reliably.   ROV 09/20/11 -- COPD, OSA. Returns for f/u. Tells me that she has been rx for COPD exacerbation x 2 since our last visit. She tells me that her CXR showed "a spot on one of her lungs". Was done at [facility] ER on 08/23/11. She was treated with pred and azithro on each occasion. She is on Advair and Spiriva. Continues to have severe cough, costochondritic pain. The pred and azithro helped some but not completely. Ran out loratadine 2 weeks ago. Still on omeprazole bid. She is wearing CPAP 10 reliably.   ROV 10/25/11 -- COPD, OSA on CPAP 10. Last time we started [facility] in place of Advair to see if this helped w UA sx. Still on Spiriva. Restarted loratadine and continued omeprazole bid. She feels that her breathing is the same, cough is a bit better on the [facility]. No flares, but she is still having nasal gtt despite the loratadine.   12/17/2011 Acute OV  Pt presents for an acute office visit, complains of increased SOB, wheezing, prod cough with yellow-to-brown mucus  2 weeks . Went to ED on 1.23.13 and was given zpak and prednisone 50mg  x 4 days. CXR with  chronic changing. Has finished abx and steroids. Got some better but cough never went away. Still smoking 1/2 PPD (takes O2 off -goes outside to smoke) .  Remains on Dulera and Spiriva  With no missed doses.  OTC not helping.  Wants referral for home health.  Encouraged to quit smoking.   ROV 02/18/12 -- COPD, OSA on CPAP, chronic cough. Had an AE with residual cough as described above 2/13. Remains on Spiriva + Dulera. Remains on omeprazole bid, flonase, not using loratadine right now. Still smoking about 3 cig a day. She is interested in trying the nicotine patches  ROV 07/08/12 -- COPD, OSA on CPAP, chronic cough. She reports that she quit smoking July 3! She has gained 60 lbs over the last year. Her breathing is stable, still limited. She is wearing her O2 and her CPAP. Continues to take Spiriva + Dulera. She reports that she is still having GERD sx even on omeprazole bid. She ran out of flonase 1 month ago. She has had some epistaxis on R.   ROV 11/25/12 -- COPD, OSA on CPAP, chronic cough. Since last time she restarted smoking - currently at 5 cig a day. She ran out of her [name] a week ago. Still taking spiriva. She has been having more cough over about  2 weeks. She has been using albuterol nebs bid - about to run out of it, need refill. She remains on loratadine, fluticasone, omeprazole. She hasn't been using CPAP regularly - needs new mask, this is being arranged. She is to undergo breast bx for a nodule this week.    [facility] follow up 12/19/12 Patient returns for post hospital followup. Patient was admitted January 24-27th 2014 for COPD, exacerbation. She was treated with IV antibiotics, nebulized bronchodilators, and IV steroids.  She was discharged on a steroid taper, along with Levaquin.  Since discharge. She has had  some improvement however, continues to have productive cough, with some thick, yellow. Mucus. Unfortunately patient has restarted smoking. Smoking cessation education was  given. Chest x-ray today shows no acute process Patient denies any hemoptysis, orthopnea, PND, or leg swelling.   03/11/13 -- COPD, OSA on CPAP, chronic cough, tobacco use. Admitted 3/6 - 3/11 for AE-COPD. She feels better. She is having some nasal congestion, on loratadine + nasal steroid. ENT saw her and increased her PPI. She is wearing her CPAP.   ER Follow up 06/12/13 --  pt reports breathing is doing much better since last ED visit 7/10 w SOB given pred and abx but did not take abx-- wearing CPAP 1-2 nights per week-- states it makes her feel claustrophobic-- denies any other concerns at this time Cough and congestion have improved. Shortness, of breath is also decreased. Patient denies any hemoptysis, orthopnea, PND, or increased leg swelling  ROV 12/08/13 -- COPD, OSA on CPAP, chronic cough, tobacco use.  She underwent PFT on 11/16/13 > severe AFL, no BD response, superimposed restriction, decreased DLCO.  Her FEV1 is stable from '09, FVC is decrease slightly. She is upset today because she was unable to be seen on 1/5 when she was here for PFT and was sick. She is also upset that I didn't come to see her when she was admitted to hospital since last Summer. She is still smoking a few cigarettes a day. Remains on Spiriva, symbicort, duonebs prn. Her O2 is set at  2L/min. She is wearing CPAP reliably   ROV 07/06/14 -- follows for severe COPD, OSA, cough, continued tobacco. She was seen by Dr Annamaria Boots in 6/15, and then again at the ED 8/24 (yesterday) for dyspnea, no chest tightness, no cough. She was given solumedrol and a script for a pred taper > she hasn't filled it.  She has taken her spiriva and symbicort. She is reliable with her CPAP mask.  She is on omeprazole and loratadine.    ROV 08/03/14 -- severe COPD, OSA on CPAP, cough, continued tobacco use, about 3 cigarettes daily. Also some component UA disease. She continues to cough and have midfollow up for  sternal chest pain. She is reliable with her  omeprazole 40.  She has been using albuterol.   ROV 01/10/15 -- follow up for severe COPD OSA, cough and tobacco use.  She follows up today following treatment for an apparent AE of her COPD and UA disease. She is doing a lot of coughing, causing mid abd and back soreness. She c/o some baseline soreness and chest tightness. She has loratdine, has not been nasal steroid for several weeks. She is on omeprazole.   ROV 04/18/15 -- follow-up visit for COPD/asthma, chronic cough and upper airway irritation, tobacco use, and obstructive sleep apnea on CPAP. She was admitted to the hospital 4/7-12 and then 5/19-20 with AE-COPD and L rib fractures respectively. She is smoking  up to 1/2 pk a day, often aroun 3-4 cig a day. She is using symbicort + spiriva.  She is using albuterol on a schedule as well qid. She is also on flonase bid, loratadine. She is not wearing her CPAP, has not worn in in a month.  Feels close to her usual baseline. Still having L rib pain.   I reviewed the hospital notes from April and May.  ROV 06/21/15 -- follow-up visit for chronic cough, upper airway irritation syndrome, COPD with a bronchodilator response, and obstructive sleep apnea on CPAP.   She has unfortunately had rib fx's. She is still smoking, has had some stretches of 8-13 days when she has not smoked any. He breathing has been better when she is off the cigarettes. She is using her albuterol at least 2x a day. She is on flonase, loratadine. She has had to stop the flonase sometimes due nasal irritation. She is wearing her CPAP, every night. Had her    ROS  See history of present illness   Objective:   Physical Exam Filed Vitals:   06/21/15 0913  BP: 132/74  Pulse: 88  Height: 5' (1.524 m)  Weight: 96.435 kg (212 lb 9.6 oz)  SpO2: 95%    Gen: Obese, in no distress,  normal affect  ENT: No lesions,  mouth clear,  oropharynx clear, no postnasal drip, hoarse voice  Neck: No JVD, no TMG, no carotid bruits, loud exp  stridor and hoarse voice  Lungs: No use of accessory muscles, distant, no wheezes   Cardiovascular: RRR, heart sounds normal, no murmur or gallops, no peripheral edema  Musculoskeletal: No deformities, no cyanosis or clubbing  Neuro: alert, non focal  Skin: Warm, no lesions or rashes, old scars on B UE's   Assessment & Plan:   COPD (chronic obstructive pulmonary disease) No exacerbations since last time although she does continue to have cough. It was a sneeze that caused her to experience new rib fractures since her last visit.  our biggest intervention is bent to work on smoking cessation and she has made progress with this  Obstructive sleep apnea She has restarted her CPAP and notes that she does feel better since doing so. Encouraged her to continue  Chronic allergic rhinitis Ranitidine and foot exam nasal spray. She occasionally has to stop the fluticasone due to nasal irritation and epistaxis  Tobacco abuse She has made great strides and decreasing her cigarettes. I congratulated her for this and we discussed strategies for her to for Stopping Completely. She Is Not Interested in Medications at This Time but We May Reconsider This in the Future to Being on Her Progress

## 2015-07-05 ENCOUNTER — Other Ambulatory Visit: Payer: Self-pay | Admitting: Emergency Medicine

## 2015-07-12 ENCOUNTER — Ambulatory Visit
Admission: RE | Admit: 2015-07-12 | Discharge: 2015-07-12 | Disposition: A | Discharge status: Home / Self Care | Payer: Medicaid Other | Source: Ambulatory Visit

## 2015-07-12 DIAGNOSIS — Z1231 Encounter for screening mammogram for malignant neoplasm of breast: Secondary | ICD-10-CM

## 2015-07-22 ENCOUNTER — Telehealth: Payer: Self-pay | Admitting: Emergency Medicine

## 2015-07-22 MED ORDER — AZITHROMYCIN 250 MG PO TABS: ORAL_TABLET | ORAL | Status: DC

## 2015-07-22 NOTE — Telephone Encounter (Signed)
Called spoke with pt. She reports x 3 days she has had prod cough (thick yellow phlem), chest tx, increase SOB w/ exertion. She wants something called in. RB not on scheduled. Please advise MW thanks

## 2015-07-22 NOTE — Telephone Encounter (Signed)
Spoke with pt and advised of Dr Wert's recommendations.  Rx sent to pharmacy. 

## 2015-07-22 NOTE — Telephone Encounter (Signed)
zpak and f/u early next week if not improving

## 2015-08-04 ENCOUNTER — Telehealth: Payer: Self-pay | Admitting: Emergency Medicine

## 2015-08-04 NOTE — Telephone Encounter (Signed)
LM for pt to return call. Not sure which Albuterol she needs refilled.

## 2015-08-05 NOTE — Telephone Encounter (Signed)
lmtcb X2 for pt.  

## 2015-08-08 ENCOUNTER — Telehealth: Payer: Self-pay | Admitting: Emergency Medicine

## 2015-08-08 MED ORDER — ALBUTEROL SULFATE HFA 108 (90 BASE) MCG/ACT IN AERS: 2.0000 | INHALATION_SPRAY | RESPIRATORY_TRACT | Status: DC | PRN

## 2015-08-08 NOTE — Telephone Encounter (Signed)
lmtcb x3 for pt. Per triage protocol, message will be closed. If/when the pt calls back a new message will need to be created.

## 2015-08-08 NOTE — Telephone Encounter (Signed)
Spoke with pt. Rx has been sent in. Nothing further was needed. 

## 2015-08-08 NOTE — Telephone Encounter (Signed)
Pt returned call [phone] °

## 2015-08-08 NOTE — Telephone Encounter (Signed)
lmtcb for pt.  

## 2015-08-09 ENCOUNTER — Other Ambulatory Visit: Payer: Self-pay | Admitting: Internal Medicine

## 2015-08-09 ENCOUNTER — Ambulatory Visit
Admission: RE | Admit: 2015-08-09 | Discharge: 2015-08-09 | Disposition: A | Discharge status: Home / Self Care | Payer: Medicaid Other | Source: Ambulatory Visit | Attending: Internal Medicine | Admitting: Internal Medicine

## 2015-08-09 DIAGNOSIS — S2249XA Multiple fractures of ribs, unspecified side, initial encounter for closed fracture: Secondary | ICD-10-CM

## 2015-08-15 ENCOUNTER — Other Ambulatory Visit: Payer: Self-pay | Admitting: Gastroenterology

## 2015-08-23 ENCOUNTER — Encounter (HOSPITAL_COMMUNITY): Payer: Self-pay | Admitting: *Deleted

## 2015-09-01 NOTE — H&P (Signed)
  Stephanie Schultz HPI: The patient currently denies any issues with hematochezia, melena, diarrhea, abdominal pain, nausea, vomiting, GERD, or dysphagia. There is no known family history of colon cancer. The patient denies any issues with chest pain, MI, or sleep apnea. 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 She was evaluated for her symptomatic cholelithiasis, but she has not undergone the surgery as she is high risk. Her pulmonary status is very poor. The patient complains of constipation and she uses Linzess with some efficacy.  Past Medical History  Diagnosis Date  . Hypertension   . Anxiety   . OSA on CPAP     CPAP at night   . Dizziness     pt believes this is motion sickness or vertigo  . GERD (gastroesophageal reflux disease)   . Headache(784.0)   . Cancer ([place]) 1990    cervical   . Anemia   . Morbid obesity ([place])   . Sickle cell trait ([place])   . Hx of cardiac catheterization     a. LHC at [facility] in [place] [place] 09/2008:  Normal coronary arteries EF 70%.  . Tobacco abuse     a. up to 3ppd from age 13 to 17, now 1/4 ppd (01/2013)  . Shortness of breath   . Pneumonia   . Diabetes mellitus without complication ([place])   . Emphysema   . COPD (chronic obstructive pulmonary disease) ([place])     dR . Rosiland Oz    Past Surgical History  Procedure Laterality Date  . Tubal ligation    . Cardiac catheterization    . Colonoscopy  09/05/2012    Procedure: COLONOSCOPY;  Surgeon: Beryle Beams, MD;  Location: [facility] ENDOSCOPY;  Service: Endoscopy;  Laterality: N/A;    Family History  Problem Relation Age of Onset  . Lung cancer Paternal Aunt   . Lung cancer Paternal Grandfather   . Other Father     unaware of father's medical history  . Diabetes Mother     alive @ 24  . Myasthenia gravis Mother   . Other      multiple siblings a&w.    Social History:  reports that she has been smoking Cigarettes.  She has a 18 pack-year  smoking history. She has never used smokeless tobacco. She reports that she does not drink alcohol or use illicit drugs.  Allergies:  Allergies  Allergen Reactions  . Acetaminophen Other (See Comments)    Pt was told that she could not take Tylenol, does not know why    Medications: Scheduled: Continuous:  No results found for this or any previous visit (from the past 24 hour(s)).   No results found.  ROS:  As stated above in the HPI otherwise negative.  Last menstrual period 06/04/2000.    PE: Gen: NAD, Alert and Oriented HEENT:  [place]/AT, EOMI Neck: Supple, no LAD Lungs: CTA Bilaterally CV: RRR without M/G/R ABM: Soft, NTND, +BS Ext: No C/C/E  Assessment/Plan: 1) Personal history of polyp - Colonoscopy.  Stephanie Schultz D 09/01/2015, 1:02 PM

## 2015-09-02 ENCOUNTER — Ambulatory Visit (HOSPITAL_COMMUNITY): Payer: Medicaid Other | Admitting: Anesthesiology

## 2015-09-02 ENCOUNTER — Ambulatory Visit (HOSPITAL_COMMUNITY)
Admission: RE | Admit: 2015-09-02 | Discharge: 2015-09-02 | Disposition: A | Discharge status: Home / Self Care | Payer: Medicaid Other | Source: Ambulatory Visit | Attending: Gastroenterology | Admitting: Gastroenterology

## 2015-09-02 ENCOUNTER — Encounter (HOSPITAL_COMMUNITY): Payer: Self-pay | Admitting: *Deleted

## 2015-09-02 ENCOUNTER — Encounter (HOSPITAL_COMMUNITY)
Admission: RE | Disposition: A | Discharge status: Home / Self Care | Payer: Self-pay | Source: Ambulatory Visit | Attending: Gastroenterology

## 2015-09-02 DIAGNOSIS — Z86010 Personal history of colonic polyps: Secondary | ICD-10-CM | POA: Diagnosis not present

## 2015-09-02 DIAGNOSIS — G4733 Obstructive sleep apnea (adult) (pediatric): Secondary | ICD-10-CM | POA: Insufficient documentation

## 2015-09-02 DIAGNOSIS — K219 Gastro-esophageal reflux disease without esophagitis: Secondary | ICD-10-CM | POA: Insufficient documentation

## 2015-09-02 DIAGNOSIS — Z9981 Dependence on supplemental oxygen: Secondary | ICD-10-CM | POA: Insufficient documentation

## 2015-09-02 DIAGNOSIS — Z6841 Body Mass Index (BMI) 40.0 and over, adult: Secondary | ICD-10-CM | POA: Insufficient documentation

## 2015-09-02 DIAGNOSIS — F17210 Nicotine dependence, cigarettes, uncomplicated: Secondary | ICD-10-CM | POA: Insufficient documentation

## 2015-09-02 DIAGNOSIS — J9620 Acute and chronic respiratory failure, unspecified whether with hypoxia or hypercapnia: Secondary | ICD-10-CM | POA: Insufficient documentation

## 2015-09-02 DIAGNOSIS — Z7984 Long term (current) use of oral hypoglycemic drugs: Secondary | ICD-10-CM | POA: Diagnosis not present

## 2015-09-02 DIAGNOSIS — J449 Chronic obstructive pulmonary disease, unspecified: Secondary | ICD-10-CM | POA: Diagnosis not present

## 2015-09-02 DIAGNOSIS — E119 Type 2 diabetes mellitus without complications: Secondary | ICD-10-CM | POA: Diagnosis not present

## 2015-09-02 DIAGNOSIS — K5900 Constipation, unspecified: Secondary | ICD-10-CM | POA: Diagnosis present

## 2015-09-02 DIAGNOSIS — D573 Sickle-cell trait: Secondary | ICD-10-CM | POA: Diagnosis not present

## 2015-09-02 DIAGNOSIS — D123 Benign neoplasm of transverse colon: Secondary | ICD-10-CM | POA: Diagnosis not present

## 2015-09-02 DIAGNOSIS — I10 Essential (primary) hypertension: Secondary | ICD-10-CM | POA: Diagnosis not present

## 2015-09-02 DIAGNOSIS — Z79899 Other long term (current) drug therapy: Secondary | ICD-10-CM | POA: Diagnosis not present

## 2015-09-02 HISTORY — PX: COLONOSCOPY WITH PROPOFOL: SHX5780

## 2015-09-02 LAB — GLUCOSE, CAPILLARY: GLUCOSE-CAPILLARY: 92 mg/dL (ref 65–99)

## 2015-09-02 SURGERY — COLONOSCOPY WITH PROPOFOL
Anesthesia: Monitor Anesthesia Care

## 2015-09-02 MED ORDER — PROPOFOL 10 MG/ML IV BOLUS
INTRAVENOUS | Status: AC
  Filled 2015-09-02: qty 20

## 2015-09-02 MED ORDER — PROPOFOL 500 MG/50ML IV EMUL
INTRAVENOUS | Status: DC | PRN
Start: 2015-09-02 — End: 2015-09-02
  Administered 2015-09-02: 100 ug/kg/min via INTRAVENOUS

## 2015-09-02 MED ORDER — ALBUTEROL SULFATE (2.5 MG/3ML) 0.083% IN NEBU
2.5000 mg | INHALATION_SOLUTION | Freq: Once | RESPIRATORY_TRACT | Status: AC
  Administered 2015-09-02: 2.5 mg via RESPIRATORY_TRACT

## 2015-09-02 MED ORDER — PROPOFOL 10 MG/ML IV BOLUS
INTRAVENOUS | Status: DC | PRN
  Administered 2015-09-02 (×5): 20 mg via INTRAVENOUS

## 2015-09-02 MED ORDER — SODIUM CHLORIDE 0.9 % IV SOLN
INTRAVENOUS | Status: DC
Start: 2015-09-02 — End: 2015-09-02

## 2015-09-02 MED ORDER — ALBUTEROL SULFATE (2.5 MG/3ML) 0.083% IN NEBU
INHALATION_SOLUTION | RESPIRATORY_TRACT | Status: AC
  Filled 2015-09-02: qty 3

## 2015-09-02 MED ORDER — LACTATED RINGERS IV SOLN
INTRAVENOUS | Status: DC
  Administered 2015-09-02: 1000 mL via INTRAVENOUS

## 2015-09-02 SURGICAL SUPPLY — 21 items

## 2015-09-02 NOTE — Discharge Instructions (Signed)

## 2015-09-02 NOTE — Op Note (Signed)
[facility] [place], [address]   COLONOSCOPY PROCEDURE REPORT  PATIENT: Stephanie Schultz, Stephanie Schultz  MR#: [id number] BIRTHDATE: [birth year] , 33  yrs. old GENDER: female ENDOSCOPIST: Carol Ada, MD REFERRED BY: PROCEDURE DATE:  Sep 17, 2015 PROCEDURE:   Colonoscopy with snare polypectomy ASA CLASS:   Class III INDICATIONS:Personal history of polyps MEDICATIONS: Monitored anesthesia care  DESCRIPTION OF PROCEDURE:   After the risks and benefits and of the procedure were explained, informed consent was obtained.  revealed no abnormalities of the rectum.    The [id number] ([id number]) endoscope was introduced through the anus and advanced to the cecum, which was identified by both the appendix and ileocecal valve .  The quality of the prep was excellent. .  The instrument was then slowly withdrawn as the colon was fully examined. Estimated blood loss is zero unless otherwise noted in this procedure report.   FINDINGS: Two 3 mm sessile transverse colon polyps were removed with a cold snare.  No evidence of any masses, inflammation, ulcerations, erosions, or vascular abnormalities.   No abnormalities with retroflexion in the rectum.     The scope was then withdrawn from the patient and the procedure completed.  WITHDRAWAL TIME: 13 minutes 0 seconds  COMPLICATIONS: There were no immediate complications. ENDOSCOPIC IMPRESSION: 1) Transverse colon polyps.  RECOMMENDATIONS: 1) Await biopsy results. 2) Repeat the colonoscopy in 5 years.  REPEAT EXAM:  cc: _______________________________ eSignedCarol Ada, MD Sep 17, 2015 11:22 AM   CPT CODES: ICD CODES:

## 2015-09-02 NOTE — Anesthesia Postprocedure Evaluation (Signed)
  Anesthesia Post-op Note  Patient: Stephanie Schultz  Procedure(s) Performed: Procedure(s) (LRB): COLONOSCOPY WITH PROPOFOL (N/A)  Patient Location: PACU  Anesthesia Type: MAC  Level of Consciousness: awake and alert   Airway and Oxygen Therapy: Patient Spontanous Breathing  Post-op Pain: mild  Post-op Assessment: Post-op Vital signs reviewed, Patient's Cardiovascular Status Stable, Respiratory Function Stable, Patent Airway and No signs of Nausea or vomiting  Last Vitals:  Filed Vitals:   09/02/15 1140  BP: 128/80  Pulse: 83  Temp:   Resp: 23    Post-op Vital Signs: stable   Complications: No apparent anesthesia complications

## 2015-09-02 NOTE — Transfer of Care (Signed)
Immediate Anesthesia Transfer of Care Note  Patient: Stephanie Schultz  Procedure(s) Performed: Procedure(s): COLONOSCOPY WITH PROPOFOL (N/A)  Patient Location: PACU and Endoscopy Unit  Anesthesia Type:MAC  Level of Consciousness: awake, alert , oriented and patient cooperative  Airway & Oxygen Therapy: Patient Spontanous Breathing and Patient connected to face mask oxygen  Post-op Assessment: Report given to RN and Post -op Vital signs reviewed and stable  Post vital signs: Reviewed and stable  Last Vitals:  Filed Vitals:   09/02/15 1020  BP: 106/49  Pulse: 86  Temp: 36.7 C  Resp: 19    Complications: No apparent anesthesia complications

## 2015-09-02 NOTE — Anesthesia Preprocedure Evaluation (Addendum)
Anesthesia Evaluation  Patient identified by MRN, date of birth, ID band Patient awake    Reviewed: Allergy & Precautions, H&P , NPO status , Patient's Chart, lab work & pertinent test results  Airway Mallampati: II  TM Distance: >3 FB Neck ROM: full    Dental  (+) Dental Advisory Given, Edentulous Upper, Edentulous Lower   Pulmonary shortness of breath and with exertion, sleep apnea , COPD,  COPD inhaler and oxygen dependent, Current Smoker,  Hoarseness.  Acute on chronic respiratory failure.  Chronic diastolic failure.   Pulmonary exam normal breath sounds clear to auscultation       Cardiovascular Exercise Tolerance: Poor hypertension, Pt. on medications Normal cardiovascular exam Rhythm:regular Rate:Normal  Prolonged QT   Neuro/Psych Anxiety negative neurological ROS  negative psych ROS   GI/Hepatic negative GI ROS, Neg liver ROS, GERD  Medicated and Controlled,  Endo/Other  diabetes, Well Controlled, Type 2, Oral Hypoglycemic AgentsMorbid obesity  Renal/GU negative Renal ROS  negative genitourinary   Musculoskeletal   Abdominal (+) + obese,   Peds  Hematology negative hematology ROS (+) Sickle cell trait ,   Anesthesia Other Findings   Reproductive/Obstetrics negative OB ROS                          Anesthesia Physical Anesthesia Plan  ASA: IV  Anesthesia Plan: MAC   Post-op Pain Management:    Induction:   Airway Management Planned:   Additional Equipment:   Intra-op Plan:   Post-operative Plan:   Informed Consent: I have reviewed the patients History and Physical, chart, labs and discussed the procedure including the risks, benefits and alternatives for the proposed anesthesia with the patient or authorized representative who has indicated his/her understanding and acceptance.   Dental Advisory Given  Plan Discussed with: CRNA and Surgeon  Anesthesia Plan Comments:          Anesthesia Quick Evaluation

## 2015-09-05 ENCOUNTER — Encounter (HOSPITAL_COMMUNITY): Payer: Self-pay | Admitting: Gastroenterology

## 2015-09-18 ENCOUNTER — Encounter (HOSPITAL_COMMUNITY): Payer: Self-pay | Admitting: Emergency Medicine

## 2015-09-18 ENCOUNTER — Emergency Department (HOSPITAL_COMMUNITY)
Admission: EM | Admit: 2015-09-18 | Discharge: 2015-09-18 | Disposition: A | Discharge status: Home / Self Care | Payer: Medicaid Other | Attending: Emergency Medicine | Admitting: Emergency Medicine

## 2015-09-18 ENCOUNTER — Emergency Department (HOSPITAL_COMMUNITY): Payer: Medicaid Other

## 2015-09-18 DIAGNOSIS — R110 Nausea: Secondary | ICD-10-CM | POA: Insufficient documentation

## 2015-09-18 DIAGNOSIS — Z9989 Dependence on other enabling machines and devices: Secondary | ICD-10-CM | POA: Insufficient documentation

## 2015-09-18 DIAGNOSIS — Z862 Personal history of diseases of the blood and blood-forming organs and certain disorders involving the immune mechanism: Secondary | ICD-10-CM | POA: Insufficient documentation

## 2015-09-18 DIAGNOSIS — Z7951 Long term (current) use of inhaled steroids: Secondary | ICD-10-CM | POA: Insufficient documentation

## 2015-09-18 DIAGNOSIS — Z8701 Personal history of pneumonia (recurrent): Secondary | ICD-10-CM | POA: Diagnosis not present

## 2015-09-18 DIAGNOSIS — F419 Anxiety disorder, unspecified: Secondary | ICD-10-CM | POA: Insufficient documentation

## 2015-09-18 DIAGNOSIS — Z9889 Other specified postprocedural states: Secondary | ICD-10-CM | POA: Diagnosis not present

## 2015-09-18 DIAGNOSIS — J441 Chronic obstructive pulmonary disease with (acute) exacerbation: Secondary | ICD-10-CM | POA: Diagnosis not present

## 2015-09-18 DIAGNOSIS — Z8541 Personal history of malignant neoplasm of cervix uteri: Secondary | ICD-10-CM | POA: Insufficient documentation

## 2015-09-18 DIAGNOSIS — G4733 Obstructive sleep apnea (adult) (pediatric): Secondary | ICD-10-CM | POA: Diagnosis not present

## 2015-09-18 DIAGNOSIS — R197 Diarrhea, unspecified: Secondary | ICD-10-CM | POA: Insufficient documentation

## 2015-09-18 DIAGNOSIS — I10 Essential (primary) hypertension: Secondary | ICD-10-CM | POA: Insufficient documentation

## 2015-09-18 DIAGNOSIS — E119 Type 2 diabetes mellitus without complications: Secondary | ICD-10-CM | POA: Insufficient documentation

## 2015-09-18 DIAGNOSIS — K219 Gastro-esophageal reflux disease without esophagitis: Secondary | ICD-10-CM | POA: Insufficient documentation

## 2015-09-18 DIAGNOSIS — Z79899 Other long term (current) drug therapy: Secondary | ICD-10-CM | POA: Diagnosis not present

## 2015-09-18 DIAGNOSIS — Z720 Tobacco use: Secondary | ICD-10-CM | POA: Diagnosis not present

## 2015-09-18 DIAGNOSIS — R05 Cough: Secondary | ICD-10-CM | POA: Diagnosis present

## 2015-09-18 LAB — CBC WITH DIFFERENTIAL/PLATELET
BASOS PCT: 0 %
Basophils Absolute: 0 10*3/uL (ref 0.0–0.1)
EOS ABS: 0.1 10*3/uL (ref 0.0–0.7)
EOS PCT: 2 %
HCT: 35.4 % — ABNORMAL LOW (ref 36.0–46.0)
Hemoglobin: 12.3 g/dL (ref 12.0–15.0)
LYMPHS PCT: 32 %
Lymphs Abs: 1.9 10*3/uL (ref 0.7–4.0)
MCH: 27.8 pg (ref 26.0–34.0)
MCHC: 34.7 g/dL (ref 30.0–36.0)
MCV: 80.1 fL (ref 78.0–100.0)
MONO ABS: 0.4 10*3/uL (ref 0.1–1.0)
MONOS PCT: 7 %
NEUTROS ABS: 3.6 10*3/uL (ref 1.7–7.7)
Neutrophils Relative %: 59 %
Platelets: 230 10*3/uL (ref 150–400)
RBC: 4.42 MIL/uL (ref 3.87–5.11)
RDW: 15.4 % (ref 11.5–15.5)
WBC: 6.1 10*3/uL (ref 4.0–10.5)

## 2015-09-18 LAB — COMPREHENSIVE METABOLIC PANEL
ALBUMIN: 4 g/dL (ref 3.5–5.0)
ALK PHOS: 44 U/L (ref 38–126)
ALT: 27 U/L (ref 14–54)
ANION GAP: 7 (ref 5–15)
AST: 26 U/L (ref 15–41)
BILIRUBIN TOTAL: 0.3 mg/dL (ref 0.3–1.2)
BUN: 14 mg/dL (ref 6–20)
CALCIUM: 9.3 mg/dL (ref 8.9–10.3)
CO2: 31 mmol/L (ref 22–32)
Chloride: 101 mmol/L (ref 101–111)
Creatinine, Ser: 0.99 mg/dL (ref 0.44–1.00)
GFR calc Af Amer: >60 mL/min (ref >60)
GLUCOSE: 100 mg/dL — AB (ref 65–99)
POTASSIUM: 4.2 mmol/L (ref 3.5–5.1)
Sodium: 139 mmol/L (ref 135–145)
TOTAL PROTEIN: 7.7 g/dL (ref 6.5–8.1)

## 2015-09-18 LAB — I-STAT TROPONIN, ED: TROPONIN I, POC: 0 ng/mL (ref 0.00–0.08)

## 2015-09-18 MED ORDER — PREDNISONE 20 MG PO TABS
60.0000 mg | ORAL_TABLET | Freq: Once | ORAL | Status: AC
  Administered 2015-09-18: 60 mg via ORAL
  Filled 2015-09-18: qty 3

## 2015-09-18 MED ORDER — PREDNISONE 10 MG PO TABS: 40.0000 mg | ORAL_TABLET | Freq: Every day | ORAL | Status: AC

## 2015-09-18 MED ORDER — AZITHROMYCIN 250 MG PO TABS: 250.0000 mg | ORAL_TABLET | Freq: Every day | ORAL | Status: AC

## 2015-09-18 MED ORDER — ALBUTEROL SULFATE (2.5 MG/3ML) 0.083% IN NEBU
5.0000 mg | INHALATION_SOLUTION | Freq: Once | RESPIRATORY_TRACT | Status: AC
  Administered 2015-09-18: 5 mg via RESPIRATORY_TRACT
  Filled 2015-09-18: qty 6

## 2015-09-18 NOTE — ED Notes (Signed)
Pt reported productive cough to thick white sputum with chest tightness, (-)diaphoresis, (+)SHOB, (+)nausea. Pt reported pain across entire chest and lt upper/mid back area.

## 2015-09-18 NOTE — ED Provider Notes (Signed)
CSN: [id number]     Arrival date & time 09/18/15  1149 History   First MD Initiated Contact with Patient 09/18/15 1454     Chief Complaint  Patient presents with  . Cough     (Consider location/radiation/quality/duration/timing/severity/associated sxs/prior Treatment) Patient is a 59 y.o. female presenting with cough.  Cough Cough characteristics:  Productive Sputum characteristics:  White, yellow and green Severity:  Moderate Onset quality:  Gradual Duration:  2 days Timing:  Constant Progression:  Worsening Chronicity:  New Smoker: yes   Context: sick contacts (granddaughter) and upper respiratory infection   Relieved by: nebulizers help a little. Ineffective treatments: robitussin. Associated symptoms: chest pain (chest tightness with coughing and breathing since Friday, worse with exertion, pain in back, similar to prior pneumonia), chills, rhinorrhea, sinus congestion and wheezing   Associated symptoms: no fever, no headaches, no rash, no shortness of breath and no sore throat     Past Medical History  Diagnosis Date  . Hypertension   . Anxiety   . OSA on CPAP     CPAP at night   . Dizziness     pt believes this is motion sickness or vertigo  . GERD (gastroesophageal reflux disease)   . Headache(784.0)   . Cancer ([place]) 1990    cervical   . Anemia   . Morbid obesity ([place])   . Sickle cell trait ([place])   . Hx of cardiac catheterization     a. LHC at [facility] in [place], [place] 09/2008:  Normal coronary arteries EF 70%.  . Tobacco abuse     a. up to 3ppd from age 62 to 20, now 1/4 ppd (01/2013)  . Shortness of breath   . Pneumonia   . Diabetes mellitus without complication ([place])   . Emphysema   . COPD (chronic obstructive pulmonary disease) ([place])     dR . Rosiland Oz   Past Surgical History  Procedure Laterality Date  . Tubal ligation    . Cardiac catheterization    . Colonoscopy  09/05/2012    Procedure: COLONOSCOPY;  Surgeon: Beryle Beams, MD;  Location: [facility]  ENDOSCOPY;  Service: Endoscopy;  Laterality: N/A;  . Colonoscopy with propofol N/A 09/02/2015    Procedure: COLONOSCOPY WITH PROPOFOL;  Surgeon: Carol Ada, MD;  Location: [facility] ENDOSCOPY;  Service: Endoscopy;  Laterality: N/A;   Family History  Problem Relation Age of Onset  . Lung cancer Paternal Aunt   . Lung cancer Paternal Grandfather   . Other Father     unaware of father's medical history  . Diabetes Mother     alive @ 64  . Myasthenia gravis Mother   . Other      multiple siblings a&w.   Social History  Substance Use Topics  . Smoking status: Current Some Day Smoker -- 0.50 packs/day for 36 years    Types: Cigarettes  . Smokeless tobacco: Never Used     Comment: Approx 90 pk-yrs (up to 3ppd until ~ 2009). Smoking 3 cigs per day now.  . Alcohol Use: No   OB History    No data available     Review of Systems  Constitutional: Positive for chills. Negative for fever.  HENT: Positive for rhinorrhea. Negative for sore throat.   Eyes: Negative for visual disturbance.  Respiratory: Positive for cough and wheezing. Negative for shortness of breath.   Cardiovascular: Positive for chest pain (chest tightness with coughing and breathing since Friday, worse with exertion, pain in back, similar to prior  pneumonia).  Gastrointestinal: Positive for nausea and diarrhea (had it this morning). Negative for vomiting, abdominal pain and blood in stool.  Genitourinary: Negative for difficulty urinating.  Musculoskeletal: Negative for back pain and neck pain.  Skin: Negative for rash.  Neurological: Negative for syncope and headaches.      Allergies  Acetaminophen  Home Medications   Prior to Admission medications   Medication Sig Start Date End Date Taking? Authorizing Provider  albuterol (PROVENTIL HFA;VENTOLIN HFA) 108 (90 BASE) MCG/ACT inhaler Inhale 2 puffs into the lungs every 4 (four) hours as needed for wheezing. 08/08/15  Yes Collene Gobble, MD  albuterol (PROVENTIL) (2.5  MG/3ML) 0.083% nebulizer solution Take 3 mLs (2.5 mg total) by nebulization 4 (four) times daily. DX J44.9 Patient taking differently: Take 2.5 mg by nebulization every 6 (six) hours as needed for wheezing or shortness of breath. DX J44.9 05/03/15  Yes Collene Gobble, MD  allopurinol (ZYLOPRIM) 100 MG tablet Take 100 mg by mouth daily.   Yes Historical Provider, MD  ALPRAZolam Duanne Moron) 0.5 MG tablet Take 1 tablet (0.5 mg total) by mouth 2 (two) times daily as needed for anxiety. 02/22/15  Yes Bonnielee Haff, MD  budesonide-formoterol [facility]) 160-4.5 MCG/ACT inhaler Inhale 2 puffs into the lungs 2 (two) times daily. 05/03/15  Yes Collene Gobble, MD  cholecalciferol (VITAMIN D) 1000 UNITS tablet Take 1,000 Units by mouth daily.   Yes Historical Provider, MD  fluticasone (FLONASE) 50 MCG/ACT nasal spray Place 2 sprays into both nostrils 2 (two) times daily. Patient taking differently: Place 2 sprays into both nostrils daily.  05/12/15  Yes Collene Gobble, MD  glipiZIDE (GLUCOTROL) 10 MG tablet Take 10 mg by mouth daily before breakfast.   Yes Historical Provider, MD  HYDROcodone-acetaminophen (NORCO) 7.5-325 MG per tablet Take 1 tablet by mouth every 4 (four) hours as needed for moderate pain or severe pain. Patient taking differently: Take 1 tablet by mouth every 8 (eight) hours as needed for moderate pain or severe pain.  04/01/15  Yes Thurnell Lose, MD  hyoscyamine (ANASPAZ) 0.125 MG TBDP disintergrating tablet Place 0.125 mg under the tongue 2 (two) times daily.    Yes Historical Provider, MD  ibuprofen (ADVIL,MOTRIN) 800 MG tablet Take 800 mg by mouth every 8 (eight) hours as needed for moderate pain.    Yes Historical Provider, MD  ipratropium (ATROVENT) 0.02 % nebulizer solution Take 2.5 mLs (0.5 mg total) by nebulization every 6 (six) hours as needed for wheezing or shortness of breath. 05/03/15  Yes Collene Gobble, MD  Linaclotide (LINZESS) 145 MCG CAPS capsule Take 145 mcg by mouth every  morning.   Yes Historical Provider, MD  loratadine (CLARITIN) 10 MG tablet Take 10 mg by mouth daily.   Yes Historical Provider, MD  NEXIUM 40 MG capsule TAKE ONE CAPSULE BY MOUTH DAILY AT 12 NOON 07/05/15  Yes Collene Gobble, MD  pregabalin (LYRICA) 75 MG capsule Take 75 mg by mouth 2 (two) times daily.   Yes Historical Provider, MD  sulfamethoxazole-trimethoprim (BACTRIM DS,SEPTRA DS) 800-160 MG tablet Take 1 tablet by mouth 2 (two) times daily.   Yes Historical Provider, MD  tiotropium (SPIRIVA) 18 MCG inhalation capsule Place 1 capsule (18 mcg total) into inhaler and inhale daily. 05/03/15  Yes Collene Gobble, MD  triamterene-hydrochlorothiazide (DYAZIDE) 37.5-25 MG per capsule Take 1 capsule by mouth daily.   Yes Historical Provider, MD  zolpidem (AMBIEN) 10 MG tablet Take 10 mg by mouth at  bedtime.   Yes Historical Provider, MD  azithromycin (ZITHROMAX Z-PAK) 250 MG tablet Take 1 tablet (250 mg total) by mouth daily. Take 2 tablets today, followed by 1 tablet per day for 4 days 09/18/15 09/22/15  Gareth Morgan, MD  predniSONE (DELTASONE) 10 MG tablet Take 4 tablets (40 mg total) by mouth daily. 09/18/15 09/22/15  Gareth Morgan, MD   BP 115/67 mmHg  Pulse 85  Temp(Src) 98 F (36.7 C) (Oral)  Resp 24  SpO2 98%  LMP 06/04/2000 (LMP Unknown) Physical Exam  Constitutional: She is oriented to person, place, and time. She appears well-developed and well-nourished. No distress.  HENT:  Head: Normocephalic and atraumatic.  Eyes: Conjunctivae and EOM are normal.  Neck: Normal range of motion.  Cardiovascular: Normal rate, regular rhythm, normal heart sounds and intact distal pulses.  Exam reveals no gallop and no friction rub.   No murmur heard. Pulmonary/Chest: Effort normal and breath sounds normal. No respiratory distress. She has no wheezes. She has no rales.  Abdominal: Soft. She exhibits no distension. There is no tenderness. There is no guarding.  Musculoskeletal: She exhibits no edema  or tenderness.  Neurological: She is alert and oriented to person, place, and time.  Skin: Skin is warm and dry. No rash noted. She is not diaphoretic. No erythema.  Nursing note and vitals reviewed.   ED Course  Procedures (including critical care time) Labs Review Labs Reviewed  CBC WITH DIFFERENTIAL/PLATELET - Abnormal; Notable for the following:    HCT 35.4 (*)    All other components within normal limits  COMPREHENSIVE METABOLIC PANEL - Abnormal; Notable for the following:    Glucose, Bld 100 (*)    All other components within normal limits  I-STAT TROPOININ, ED    Imaging Review Dg Chest 2 View  09/18/2015  CLINICAL DATA:  Shortness of breath and cough. EXAM: CHEST  2 VIEW COMPARISON:  08/09/2015 and 06/09/2015 FINDINGS: Heart size and pulmonary vascularity are normal. Chronic linear scarring at the right base posteriorly. Slight peribronchial thickening. Chronic pleural thickening laterally on the left due to old left rib fractures. Arthritic changes of both shoulders. IMPRESSION: Bronchitic changes. Electronically Signed   By: Lorriane Shire M.D.   On: 09/18/2015 13:24   I have personally reviewed and evaluated these images and lab results as part of my medical decision-making.   EKG Interpretation   Date/Time:  Sunday September 18 2015 12:18:46 EST Ventricular Rate:  87 PR Interval:  144 QRS Duration: 77 QT Interval:  370 QTC Calculation: 445 R Axis:   55 Text Interpretation:  Sinus rhythm No significant change since last  tracing Confirmed by [facility] MD, Corderro Koloski ([id number]) on 09/18/2015 3:06:48 PM      MDM   Final diagnoses:  COPD exacerbation ([place])   59 year old female with a history of COPD, smoking, DM, htn, OSA who presents with concern for shortness of breath and cough.  Differential diagnosis for dyspnea includes ACS, PE, COPD exacerbation, CHF exacerbation, anemia, pneumonia.  Chest x-ray was done which showed no sign of pneumonia, no pulmonary edema, no  pneumothorax.  EKG was evaluated by me which showed no acute ST changes.  No hypoxia, no tachypnea, and in setting of sick contacts/URI symptoms and wheezing, symptoms most likely secondary to COPD exacerbation in setting of URI.  Patient improved with albuterol nebulizer given in ED and 60mg  prednisone.  Patient stable for outpt treatment of mild COPD exacerbation. Given rx for prednisone for 4 days and azithromycin given change  in sputum. Patient discharged in stable condition with understanding of reasons to return.    Gareth Morgan, MD 09/18/15 2324

## 2015-09-23 ENCOUNTER — Other Ambulatory Visit: Payer: Self-pay | Admitting: Emergency Medicine

## 2015-09-30 ENCOUNTER — Encounter: Payer: Self-pay | Admitting: Emergency Medicine

## 2015-09-30 ENCOUNTER — Ambulatory Visit (INDEPENDENT_AMBULATORY_CARE_PROVIDER_SITE_OTHER): Payer: Medicaid Other | Admitting: Emergency Medicine

## 2015-09-30 VITALS — BP 120/76 | HR 95 | Ht 60.0 in | Wt 202.0 lb

## 2015-09-30 DIAGNOSIS — J410 Simple chronic bronchitis: Secondary | ICD-10-CM

## 2015-09-30 DIAGNOSIS — G4733 Obstructive sleep apnea (adult) (pediatric): Secondary | ICD-10-CM

## 2015-09-30 DIAGNOSIS — J309 Allergic rhinitis, unspecified: Secondary | ICD-10-CM | POA: Diagnosis not present

## 2015-09-30 NOTE — Patient Instructions (Signed)
Please continue your medications as you have been taking them Continue to use CPAP every night when sleeping Wear oxygen at all times We discussed smoking today, especially in light of your need for possible gallbladder surgery. Your COPD and sleep apnea put you at increased risk for problems with any surgery, but do not mean that you can't have an operation if the benefits outweigh these risks. If we are going to proceed to surgery and the most important thing for you to do is to stop smoking altogether at least 3 weeks before the procedure. We should make every effort to do this Follow with Dr Lamonte Sakai in 3 months or sooner if you have any problems.

## 2015-09-30 NOTE — Progress Notes (Signed)
Subjective:    Patient ID: Stephanie Schultz, female    DOB: 1956-05-06, 59 y.o.   MRN: [id number]  HPI 59 yo smoker (90 pk-yrs, has cut down to about 2 cig a day), hx HTN, borderline DM, allergies. Dx with COPD at [facility] in [place]. She was started on O2 in ~2009. She had PFT within the last year. She remains fairly active as long as she wears her O2. She can walk about 30-40 feet, has to stop when shopping to rest. She has daily cough, productive of white phlegm. She has rare exacerbations, last was in Jan 2012.   ROV 07/05/11 -- COPD, OSA. Her data from [place] showed she needs CPAP 10. We ordered O2 thru [facility]. She continues to smoke 3 cigs a day. She has been coughing more, having more sputum - yellow/white sputum.  She is having more dyspnea, more wheezing x 3 weeks. She has gained 30+ lbs since coming to [place]. She has not been wearing her O2 reliably.   ROV 09/20/11 -- COPD, OSA. Returns for f/u. Tells me that she has been rx for COPD exacerbation x 2 since our last visit. She tells me that her CXR showed "a spot on one of her lungs". Was done at [facility] ER on 08/23/11. She was treated with pred and azithro on each occasion. She is on Advair and Spiriva. Continues to have severe cough, costochondritic pain. The pred and azithro helped some but not completely. Ran out loratadine 2 weeks ago. Still on omeprazole bid. She is wearing CPAP 10 reliably.   ROV 10/25/11 -- COPD, OSA on CPAP 10. Last time we started [facility] in place of Advair to see if this helped w UA sx. Still on Spiriva. Restarted loratadine and continued omeprazole bid. She feels that her breathing is the same, cough is a bit better on the [facility] No flares, but she is still having nasal gtt despite the loratadine.   12/17/2011 Acute OV  Pt presents for an acute office visit, complains of increased SOB, wheezing, prod cough with yellow-to-brown mucus  2 weeks . Went to ED on 1.23.13 and was given zpak and prednisone 50mg  x 4 days. CXR with  chronic changing. Has finished abx and steroids. Got some better but cough never went away. Still smoking 1/2 PPD (takes O2 off -goes outside to smoke) .  Remains on Dulera and Spiriva  With no missed doses.  OTC not helping.  Wants referral for home health.  Encouraged to quit smoking.   ROV 02/18/12 -- COPD, OSA on CPAP, chronic cough. Had an AE with residual cough as described above 2/13. Remains on Spiriva + Dulera. Remains on omeprazole bid, flonase, not using loratadine right now. Still smoking about 3 cig a day. She is interested in trying the nicotine patches  ROV 07/08/12 -- COPD, OSA on CPAP, chronic cough. She reports that she quit smoking July 3! She has gained 60 lbs over the last year. Her breathing is stable, still limited. She is wearing her O2 and her CPAP. Continues to take Spiriva + Dulera. She reports that she is still having GERD sx even on omeprazole bid. She ran out of flonase 1 month ago. She has had some epistaxis on R.   ROV 11/25/12 -- COPD, OSA on CPAP, chronic cough. Since last time she restarted smoking - currently at 5 cig a day. She ran out of her [name] a week ago. Still taking spiriva. She has been having more cough over about  2 weeks. She has been using albuterol nebs bid - about to run out of it, need refill. She remains on loratadine, fluticasone, omeprazole. She hasn't been using CPAP regularly - needs new mask, this is being arranged. She is to undergo breast bx for a nodule this week.    [facility] follow up 12/19/12 Patient returns for post hospital followup. Patient was admitted January 24-27th 2014 for COPD, exacerbation. She was treated with IV antibiotics, nebulized bronchodilators, and IV steroids.  She was discharged on a steroid taper, along with Levaquin.  Since discharge. She has had  some improvement however, continues to have productive cough, with some thick, yellow. Mucus. Unfortunately patient has restarted smoking. Smoking cessation education was  given. Chest x-ray today shows no acute process Patient denies any hemoptysis, orthopnea, PND, or leg swelling.   03/11/13 -- COPD, OSA on CPAP, chronic cough, tobacco use. Admitted 3/6 - 3/11 for AE-COPD. She feels better. She is having some nasal congestion, on loratadine + nasal steroid. ENT saw her and increased her PPI. She is wearing her CPAP.   ER Follow up 06/12/13 --  pt reports breathing is doing much better since last ED visit 7/10 w SOB given pred and abx but did not take abx-- wearing CPAP 1-2 nights per week-- states it makes her feel claustrophobic-- denies any other concerns at this time Cough and congestion have improved. Shortness, of breath is also decreased. Patient denies any hemoptysis, orthopnea, PND, or increased leg swelling  ROV 12/08/13 -- COPD, OSA on CPAP, chronic cough, tobacco use.  She underwent PFT on 11/16/13 > severe AFL, no BD response, superimposed restriction, decreased DLCO.  Her FEV1 is stable from '09, FVC is decrease slightly. She is upset today because she was unable to be seen on 1/5 when she was here for PFT and was sick. She is also upset that I didn't come to see her when she was admitted to hospital since last Summer. She is still smoking a few cigarettes a day. Remains on Spiriva, symbicort, duonebs prn. Her O2 is set at  2L/min. She is wearing CPAP reliably   ROV 07/06/14 -- follows for severe COPD, OSA, cough, continued tobacco. She was seen by Dr Annamaria Boots in 6/15, and then again at the ED 8/24 (yesterday) for dyspnea, no chest tightness, no cough. She was given solumedrol and a script for a pred taper > she hasn't filled it.  She has taken her spiriva and symbicort. She is reliable with her CPAP mask.  She is on omeprazole and loratadine.    ROV 08/03/14 -- severe COPD, OSA on CPAP, cough, continued tobacco use, about 3 cigarettes daily. Also some component UA disease. She continues to cough and have midfollow up for  sternal chest pain. She is reliable with her  omeprazole 40.  She has been using albuterol.   ROV 01/10/15 -- follow up for severe COPD OSA, cough and tobacco use.  She follows up today following treatment for an apparent AE of her COPD and UA disease. She is doing a lot of coughing, causing mid abd and back soreness. She c/o some baseline soreness and chest tightness. She has loratdine, has not been nasal steroid for several weeks. She is on omeprazole.   ROV 04/18/15 -- follow-up visit for COPD/asthma, chronic cough and upper airway irritation, tobacco use, and obstructive sleep apnea on CPAP. She was admitted to the hospital 4/7-12 and then 5/19-20 with AE-COPD and L rib fractures respectively. She is smoking  up to 1/2 pk a day, often aroun 3-4 cig a day. She is using symbicort + spiriva.  She is using albuterol on a schedule as well qid. She is also on flonase bid, loratadine. She is not wearing her CPAP, has not worn in in a month.  Feels close to her usual baseline. Still having L rib pain.   I reviewed the hospital notes from April and May.  ROV 06/21/15 -- follow-up visit for chronic cough, upper airway irritation syndrome, COPD with a bronchodilator response, and obstructive sleep apnea on CPAP.   She has unfortunately had rib fx's. She is still smoking, has had some stretches of 8-13 days when she has not smoked any. He breathing has been better when she is off the cigarettes. She is using her albuterol at least 2x a day. She is on flonase, loratadine. She has had to stop the flonase sometimes due nasal irritation. She is wearing her CPAP, every night. Had her   ROV 09/30/15 -- follow-up visit for COPD with an asthmatic component, upper airway irritation syndrome and chronic cough. She also has a history of sleep apnea and uses CPAP, good complaining .  She had been well until about 2 weeks ago when she caught URI  from her grandchildren. She was treated with prednisone and azithro. She is now improved, feels back to baseline. She is having  gallbladder trouble, but has not pursued surgery due to her pulmonary risk. She is still considering this, but understands that her risk for prolonged ventilation is high.  Doing spiriva and symbicort, tolerates well.    ROS  See history of present illness   Objective:   Physical Exam Filed Vitals:   09/30/15 0948  BP: 120/76  Pulse: 95  Height: 5' (1.524 m)  Weight: 202 lb (91.627 kg)  SpO2: 95%    Gen: Obese, in no distress,  normal affect  ENT: No lesions,  mouth clear,  oropharynx clear, no postnasal drip, hoarse voice  Neck: No JVD, no TMG, no carotid bruits, loud exp stridor and hoarse voice  Lungs: No use of accessory muscles, distant, no wheezes   Cardiovascular: RRR, heart sounds normal, no murmur or gallops, no peripheral edema  Musculoskeletal: No deformities, no cyanosis or clubbing  Neuro: alert, non focal  Skin: Warm, no lesions or rashes, old scars on B UE's   Assessment & Plan:   Obstructive sleep apnea Continue CPAP every night  COPD (chronic obstructive pulmonary disease) Significant disease with daily limitations but currently at baseline following a recent exacerbation. I will continue her same bronchodilators, exertional oxygen. We discussed surgical risks today for possible gallbladder surgery. I explained to her that her lung disease does part her at increased risk for any kind of general anesthesia but that it does not absolutely preclude surgery if the benefits outweigh these risks  Chronic allergic rhinitis Continue same medications

## 2015-09-30 NOTE — Assessment & Plan Note (Signed)
Significant disease with daily limitations but currently at baseline following a recent exacerbation. I will continue her same bronchodilators, exertional oxygen. We discussed surgical risks today for possible gallbladder surgery. I explained to her that her lung disease does part her at increased risk for any kind of general anesthesia but that it does not absolutely preclude surgery if the benefits outweigh these risks

## 2015-09-30 NOTE — Assessment & Plan Note (Signed)
Continue same medications.

## 2015-09-30 NOTE — Assessment & Plan Note (Signed)
Continue CPAP every night. 

## 2015-10-20 ENCOUNTER — Ambulatory Visit: Payer: Self-pay | Admitting: General Surgery

## 2015-10-20 NOTE — H&P (Signed)
History of Present Illness Stephanie Ok MD; 10/20/2015 9:01 AM) The patient is a 59 year old female who presents for evaluation of gall stones. Stephanie Schultz is a 59 y.o. female. The patient is a 59 year old female who comes in today for further checkup.. 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 The patient recently underwent ultrasound which reveals multiple gallstones within the gallbladder. Patient comes in today with chief complaint of continued epigastric pain. She states that the pain is mainly after eating high fatty foods. She states that she continues with epigastric right upper quadrant pain. She does state that she started changing her diet however does occasionally have fried foods, high fatty foods.  Patient recently saw [name], her pulmonologist, explained to her she is a high risk candidate for general anesthesia.   Other Problems Elbert Ewings, CMA; 10/20/2015 8:30 AM) Anxiety Disorder Back Pain Cervical Cancer Chronic Obstructive Lung Disease Diabetes Mellitus Emphysema Of Lung High blood pressure Home Oxygen Use Sleep Apnea  Past Surgical History Elbert Ewings, CMA; 10/20/2015 8:30 AM) Breast Biopsy Right.  Diagnostic Studies History Elbert Ewings, [place]; 10/20/2015 8:30 AM) Colonoscopy within last year Mammogram >3 years ago Pap Smear 1-5 years ago  Allergies Elbert Ewings, CMA; 10/20/2015 8:30 AM) Acetaminophen Fruit *ANALGESICS - NonNarcotic*  Medication History Elbert Ewings, CMA; 10/20/2015 8:35 AM) Albuterol Sulfate ((2.5 MG/3ML)0.083% Nebulized Soln, Inhalation) Active. Zyloprim (100MG  Tablet, Oral) Active. Xanax (0.5MG  Tablet, Oral) Active. Symbicort (160-4.5MCG/ACT Aerosol, Inhalation) Active. Vitamin E (1000UNIT Capsule, Oral) Active. Flonase (50MCG/ACT Suspension, Nasal) Active. Glucotrol (10MG  Tablet, Oral)  Active. Hydrocodone-Acetaminophen (7.5-500MG  Tablet, Oral) Active. Spiriva HandiHaler (18MCG Capsule, Inhalation) Active. Ambien (10MG  Tablet, Oral) Active. Dyazide (37.5-25MG  Capsule, Oral) Active. Linzess (145MCG Capsule, Oral) Active. Atrovent (0.02% Solution, Inhalation) Active. Anaspaz (0.125MG  Tablet, Oral) Active. GlipiZIDE (10MG  Tablet, Oral) Active. Medications Reconciled  Social History Elbert Ewings, CMA; 10/20/2015 8:30 AM) Caffeine use Coffee. No alcohol use No drug use Tobacco use Current every day smoker.  Family History Elbert Ewings, [place]; 10/20/2015 8:30 AM) Alcohol Abuse Family Members In General. Hypertension Mother. Thyroid problems Daughter.  Pregnancy / Birth History Elbert Ewings, CMA; 10/20/2015 8:30 AM) Age at menarche 67 years. Age of menopause <47 Contraceptive History Oral contraceptives. Gravida 4 Irregular periods Maternal age 6-20 Para 3    Review of Systems Stephanie Ok MD; 10/20/2015 8:59 AM) General Present- Night Sweats and Weight Gain. Not Present- Appetite Loss, Chills, Fatigue, Fever and Weight Loss. Skin Not Present- Change in Wart/Mole, Dryness, Hives, Jaundice, New Lesions, Non-Healing Wounds, Rash and Ulcer. HEENT Present- Ringing in the Ears and Visual Disturbances. Not Present- Earache, Hearing Loss, Hoarseness, Nose Bleed, Oral Ulcers, Seasonal Allergies, Sinus Pain, Sore Throat, Wears glasses/contact lenses and Yellow Eyes. Respiratory Present- Wheezing. Not Present- Bloody sputum, Chronic Cough, Difficulty Breathing and Snoring. Cardiovascular Present- Difficulty Breathing Lying Down, Shortness of Breath and Swelling of Extremities. Not Present- Chest Pain, Leg Cramps, Palpitations and Rapid Heart Rate. Gastrointestinal Present- Abdominal Pain and Nausea. Musculoskeletal Not Present- Myalgia. Neurological Present- Headaches and Numbness. Not Present- Decreased Memory, Fainting, Seizures, Tingling, Tremor,  Trouble walking and Weakness.  Vitals Elbert Ewings CMA; 10/20/2015 8:35 AM) 10/20/2015 8:35 AM Weight: 221 lb Height: 60in Body Surface Area: 1.95 m Body Mass Index: 43.16 kg/m  Temp.: 97.64F(Temporal)  Pulse: 60 (Regular)  BP: 130/70 (Sitting, Left Arm, Standard)       Physical Exam Stephanie Ok, MD; 10/20/2015 9:2 AM) General Mental Status-Alert. General Appearance-Consistent with  stated age. Hydration-Well hydrated. Voice-Normal.  Head and Neck Head-normocephalic, atraumatic with no lesions or palpable masses.  Eye Eyeball - Bilateral-Extraocular movements intact. Sclera/Conjunctiva - Bilateral-No scleral icterus.  Chest and Lung Exam Chest and lung exam reveals -quiet, even and easy respiratory effort with no use of accessory muscles. Inspection Chest Wall - Normal. Back - normal.  Cardiovascular Cardiovascular examination reveals -normal heart sounds, regular rate and rhythm with no murmurs.  Abdomen Inspection Normal Exam - No Hernias. Palpation/Percussion Normal exam - Soft, Non Tender, No Rebound tenderness, No Rigidity (guarding) and No hepatosplenomegaly. Auscultation Normal exam - Bowel sounds normal.  Neurologic Neurologic evaluation reveals -alert and oriented x 3 with no impairment of recent or remote memory. Mental Status-Normal.  Musculoskeletal Normal Exam - Left-Upper Extremity Strength Normal and Lower Extremity Strength Normal. Normal Exam - Right-Upper Extremity Strength Normal, Lower Extremity Weakness.    Assessment & Plan Stephanie Ok MD; 10/20/2015 9:02 AM) SYMPTOMATIC CHOLELITHIASIS (K80.20) Impression: 59 year old female with a complicated past medical history currently on home oxygen, with symptomatic gallstones.  1. The patient will like to proceed to the operating room for a laparoscopic cholecystectomy. 2. Risks and benefits were discussed with the patient to generally include, but  not limited to: infection, bleeding, possible need for post op ERCP, damage to the bile ducts, bile leak, and possible need for further surgery. Also discussed with her the high likelihood of possible postoperative intubation. Also discussed the possibility of need for trach postoperatively, possible rehabilitation. Alternatives were offered and described. All questions were answered and the patient voiced understanding of the procedure and wishes to proceed at this point with a laparoscopic cholecystectomy. 3. [name] of pulmonology will need to be involved her care in the hospital.

## 2015-10-22 ENCOUNTER — Other Ambulatory Visit: Payer: Self-pay | Admitting: Emergency Medicine

## 2015-10-24 ENCOUNTER — Ambulatory Visit (INDEPENDENT_AMBULATORY_CARE_PROVIDER_SITE_OTHER): Payer: Medicaid Other | Admitting: Podiatry

## 2015-10-24 ENCOUNTER — Encounter: Payer: Self-pay | Admitting: Podiatry

## 2015-10-24 VITALS — BP 80/54 | HR 104 | Resp 18

## 2015-10-24 DIAGNOSIS — Q828 Other specified congenital malformations of skin: Secondary | ICD-10-CM

## 2015-10-24 DIAGNOSIS — B351 Tinea unguium: Secondary | ICD-10-CM

## 2015-10-24 DIAGNOSIS — M79676 Pain in unspecified toe(s): Secondary | ICD-10-CM

## 2015-10-25 NOTE — Progress Notes (Signed)
Patient ID: Stephanie Schultz, female   DOB: 1956/06/18, 59 y.o.   MRN: [id number]  Subjective: 59 year old female presents the office of the concerns of a callus the bottom of her left foot in the ball of her foot as well as a discoloration to her right big toenail. She states that she previously was given a pedicure done however due to the pain in the interim in the callus she did have them stop and she requested follow-up. She denies swelling redness or drainage at denies any pus. Denies any foreign objects stepping on recently. No other complaints at this time.  Objective: AAO 3, NAD DP/PT pulses palpable 2/4, CRT less than 3 seconds Protective sensation intact with Simms Weinstein monofilament On the left foot submetatarsal there is an annular hyperkeratotic lesion. Upon debridement there is no underlying ulceration, drainage or other signs of infection. There is no surrounding edema. No clinical signs of infection. Right hallux toenail does appear be hypertrophic, dystrophic and when white discoloration. There is mild tenderness palpation directly overlying the toenail and there is no surrounding erythema or drainage. No other areas of tenderness to bilateral lower extremities.  No other open lesions or pre-ulcer lesions. There is no pain with calf compression, swelling, warmth, erythema.  Assessment: 58 year old female left submetatarsal hyperkeratotic lesion, right hallux onychodystrophy likely onychomycosis  Plan: -Treatment options discussed including all alternatives, risks, and complications -Etiology of symptoms were discussed -Hyperkeratotic lesion debrided 1 without, complications/bleeding. -Nail debrided 1 without complications/bleeding. Discussed topical treatment. She'll pursue with over-the-counter fungi nail. -Follow-up as scheduled or sooner if any problems arise. In the meantime, encouraged to call the office with any questions, concerns, change in symptoms.   Celesta Gentile, DPM

## 2015-11-02 ENCOUNTER — Telehealth: Payer: Self-pay | Admitting: Emergency Medicine

## 2015-11-02 MED ORDER — PREDNISONE 10 MG PO TABS: ORAL_TABLET | ORAL | Status: DC

## 2015-11-02 MED ORDER — AZITHROMYCIN 250 MG PO TABS: ORAL_TABLET | ORAL | Status: AC

## 2015-11-02 NOTE — Telephone Encounter (Signed)
Patient Returned call [phone]

## 2015-11-02 NOTE — Telephone Encounter (Signed)
Spoke with pt. States that she is getting a chest cold. Reports chest congestion, coughing, sinus congestion. Cough is producing is thick mucus. Denies chest tightness, wheezing, SOB, fever Onset was 2 days ago. Would like an antibiotic sent in.  RB - please advise. Thanks.

## 2015-11-02 NOTE — Telephone Encounter (Signed)
Pt is aware of RB's recommendations. Rx's have been sent in. Nothing further was needed.

## 2015-11-02 NOTE — Telephone Encounter (Signed)
OK to send azithro Z-pack Also pred taper > Take 40mg  daily for 3 days, then 30mg  daily for 3 days, then 20mg  daily for 3 days, then 10mg  daily for 3 days, then stop

## 2015-11-02 NOTE — Telephone Encounter (Signed)
LMTCB

## 2015-11-24 ENCOUNTER — Telehealth: Payer: Self-pay | Admitting: Emergency Medicine

## 2015-11-24 ENCOUNTER — Ambulatory Visit: Payer: Medicaid Other | Admitting: Emergency Medicine

## 2015-11-24 NOTE — Telephone Encounter (Signed)
Pt cannot make appt today with RB d/t transportation issues.  Pt was scheduled today at 230 for ACUTE visit with RB and she has been having hemoptysis and bleeding from nose.  Chest hurts when coughing. Pt is requesting to be seen tomorrow - aware that Dr Lamonte Sakai not in office tomorrow morning.  Patient can see TP tomorrow morning 930 - appt rescheduled. Nothing further needed.

## 2015-11-24 NOTE — Telephone Encounter (Signed)
Called spoke with pt. She reports she is coughing up blood. appt scheduled this afternoon with RB at 2:30.

## 2015-11-25 ENCOUNTER — Ambulatory Visit (INDEPENDENT_AMBULATORY_CARE_PROVIDER_SITE_OTHER)
Admission: RE | Admit: 2015-11-25 | Discharge: 2015-11-25 | Disposition: A | Discharge status: Home / Self Care | Payer: Medicaid Other | Source: Ambulatory Visit | Attending: Adult Health | Admitting: Adult Health

## 2015-11-25 ENCOUNTER — Ambulatory Visit (INDEPENDENT_AMBULATORY_CARE_PROVIDER_SITE_OTHER): Payer: Medicaid Other | Admitting: Adult Health

## 2015-11-25 ENCOUNTER — Encounter: Payer: Self-pay | Admitting: Adult Health

## 2015-11-25 VITALS — BP 130/86 | HR 91 | Temp 97.8°F | Ht 60.0 in | Wt 221.0 lb

## 2015-11-25 DIAGNOSIS — R042 Hemoptysis: Secondary | ICD-10-CM

## 2015-11-25 DIAGNOSIS — J441 Chronic obstructive pulmonary disease with (acute) exacerbation: Secondary | ICD-10-CM

## 2015-11-25 DIAGNOSIS — J9611 Chronic respiratory failure with hypoxia: Secondary | ICD-10-CM

## 2015-11-25 MED ORDER — AMOXICILLIN-POT CLAVULANATE 875-125 MG PO TABS: 1.0000 | ORAL_TABLET | Freq: Two times a day (BID) | ORAL | Status: AC

## 2015-11-25 NOTE — Assessment & Plan Note (Signed)
Flare +/- early PNA vs Bronchitis  Repeat CXR   Plan  Augmentin 875mg  Twice daily  For 7 days  Mucinex DM Twice daily  As needed  Cough/congestion  Saline nasal spray and gel As needed   Continue on Symbicort and Spiriva .  Continue on Oxygen 2l/m  Follow up with [name] 2 weeks with chest xray .  Please contact office for sooner follow up if symptoms do not improve or worsen or seek emergency care

## 2015-11-25 NOTE — Assessment & Plan Note (Signed)
Cont on o2 .  

## 2015-11-25 NOTE — Patient Instructions (Signed)
Augmentin 875mg  Twice daily  For 7 days  Mucinex DM Twice daily  As needed  Cough/congestion  Saline nasal spray and gel As needed   Continue on Symbicort and Spiriva .  Continue on Oxygen 2l/m  Follow up with [name] 2 weeks with chest xray .  Please contact office for sooner follow up if symptoms do not improve or worsen or seek emergency care

## 2015-11-25 NOTE — Progress Notes (Signed)
Subjective:    Patient ID: Stephanie Schultz, female    DOB: Jun 14, 1956, 60 y.o.   MRN: [id number]  HPI 60 yo smoker (90 pk-yrs, has cut down to about 2 cig a day), hx HTN, borderline DM, allergies. Dx with COPD at [facility] in [place]. She was started on O2 in ~2009. She had PFT within the last year. She remains fairly active as long as she wears her O2. She can walk about 30-40 feet, has to stop when shopping to rest. She has daily cough, productive of white phlegm. She has rare exacerbations, last was in Jan 2012.   ROV 07/05/11 -- COPD, OSA. Her data from [place] showed she needs CPAP 10. We ordered O2 thru [facility]. She continues to smoke 3 cigs a day. She has been coughing more, having more sputum - yellow/white sputum.  She is having more dyspnea, more wheezing x 3 weeks. She has gained 30+ lbs since coming to [place]. She has not been wearing her O2 reliably.   ROV 09/20/11 -- COPD, OSA. Returns for f/u. Tells me that she has been rx for COPD exacerbation x 2 since our last visit. She tells me that her CXR showed "a spot on one of her lungs". Was done at [facility] ER on 08/23/11. She was treated with pred and azithro on each occasion. She is on Advair and Spiriva. Continues to have severe cough, costochondritic pain. The pred and azithro helped some but not completely. Ran out loratadine 2 weeks ago. Still on omeprazole bid. She is wearing CPAP 10 reliably.   ROV 10/25/11 -- COPD, OSA on CPAP 10. Last time we started [facility] in place of Advair to see if this helped w UA sx. Still on Spiriva. Restarted loratadine and continued omeprazole bid. She feels that her breathing is the same, cough is a bit better on the [facility]. No flares, but she is still having nasal gtt despite the loratadine.   12/17/2011 Acute OV  Pt presents for an acute office visit, complains of increased SOB, wheezing, prod cough with yellow-to-brown mucus  2 weeks . Went to ED on 1.23.13 and was given zpak and prednisone 50mg  x 4 days. CXR with  chronic changing. Has finished abx and steroids. Got some better but cough never went away. Still smoking 1/2 PPD (takes O2 off -goes outside to smoke) .  Remains on Dulera and Spiriva  With no missed doses.  OTC not helping.  Wants referral for home health.  Encouraged to quit smoking.   ROV 02/18/12 -- COPD, OSA on CPAP, chronic cough. Had an AE with residual cough as described above 2/13. Remains on Spiriva + Dulera. Remains on omeprazole bid, flonase, not using loratadine right now. Still smoking about 3 cig a day. She is interested in trying the nicotine patches  ROV 07/08/12 -- COPD, OSA on CPAP, chronic cough. She reports that she quit smoking July 3! She has gained 60 lbs over the last year. Her breathing is stable, still limited. She is wearing her O2 and her CPAP. Continues to take Spiriva + Dulera. She reports that she is still having GERD sx even on omeprazole bid. She ran out of flonase 1 month ago. She has had some epistaxis on R.   ROV 11/25/12 -- COPD, OSA on CPAP, chronic cough. Since last time she restarted smoking - currently at 5 cig a day. She ran out of her [name] a week ago. Still taking spiriva. She has been having more cough over about  2 weeks. She has been using albuterol nebs bid - about to run out of it, need refill. She remains on loratadine, fluticasone, omeprazole. She hasn't been using CPAP regularly - needs new mask, this is being arranged. She is to undergo breast bx for a nodule this week.    [facility] follow up 12/19/12 Patient returns for post hospital followup. Patient was admitted January 24-27th 2014 for COPD, exacerbation. She was treated with IV antibiotics, nebulized bronchodilators, and IV steroids.  She was discharged on a steroid taper, along with Levaquin.  Since discharge. She has had  some improvement however, continues to have productive cough, with some thick, yellow. Mucus. Unfortunately patient has restarted smoking. Smoking cessation education was  given. Chest x-ray today shows no acute process Patient denies any hemoptysis, orthopnea, PND, or leg swelling.   03/11/13 -- COPD, OSA on CPAP, chronic cough, tobacco use. Admitted 3/6 - 3/11 for AE-COPD. She feels better. She is having some nasal congestion, on loratadine + nasal steroid. ENT saw her and increased her PPI. She is wearing her CPAP.   ER Follow up 06/12/13 --  pt reports breathing is doing much better since last ED visit 7/10 w SOB given pred and abx but did not take abx-- wearing CPAP 1-2 nights per week-- states it makes her feel claustrophobic-- denies any other concerns at this time Cough and congestion have improved. Shortness, of breath is also decreased. Patient denies any hemoptysis, orthopnea, PND, or increased leg swelling  ROV 12/08/13 -- COPD, OSA on CPAP, chronic cough, tobacco use.  She underwent PFT on 11/16/13 > severe AFL, no BD response, superimposed restriction, decreased DLCO.  Her FEV1 is stable from '09, FVC is decrease slightly. She is upset today because she was unable to be seen on 1/5 when she was here for PFT and was sick. She is also upset that I didn't come to see her when she was admitted to hospital since last Summer. She is still smoking a few cigarettes a day. Remains on Spiriva, symbicort, duonebs prn. Her O2 is set at  2L/min. She is wearing CPAP reliably   ROV 07/06/14 -- follows for severe COPD, OSA, cough, continued tobacco. She was seen by Dr Annamaria Boots in 6/15, and then again at the ED 8/24 (yesterday) for dyspnea, no chest tightness, no cough. She was given solumedrol and a script for a pred taper > she hasn't filled it.  She has taken her spiriva and symbicort. She is reliable with her CPAP mask.  She is on omeprazole and loratadine.    ROV 08/03/14 -- severe COPD, OSA on CPAP, cough, continued tobacco use, about 3 cigarettes daily. Also some component UA disease. She continues to cough and have midfollow up for  sternal chest pain. She is reliable with her  omeprazole 40.  She has been using albuterol.   ROV 01/10/15 -- follow up for severe COPD OSA, cough and tobacco use.  She follows up today following treatment for an apparent AE of her COPD and UA disease. She is doing a lot of coughing, causing mid abd and back soreness. She c/o some baseline soreness and chest tightness. She has loratdine, has not been nasal steroid for several weeks. She is on omeprazole.   ROV 04/18/15 -- follow-up visit for COPD/asthma, chronic cough and upper airway irritation, tobacco use, and obstructive sleep apnea on CPAP. She was admitted to the hospital 4/7-12 and then 5/19-20 with AE-COPD and L rib fractures respectively. She is smoking  up to 1/2 pk a day, often aroun 3-4 cig a day. She is using symbicort + spiriva.  She is using albuterol on a schedule as well qid. She is also on flonase bid, loratadine. She is not wearing her CPAP, has not worn in in a month.  Feels close to her usual baseline. Still having L rib pain.   I reviewed the hospital notes from April and May.  ROV 06/21/15 -- follow-up visit for chronic cough, upper airway irritation syndrome, COPD with a bronchodilator response, and obstructive sleep apnea on CPAP.   She has unfortunately had rib fx's. She is still smoking, has had some stretches of 8-13 days when she has not smoked any. He breathing has been better when she is off the cigarettes. She is using her albuterol at least 2x a day. She is on flonase, loratadine. She has had to stop the flonase sometimes due nasal irritation. She is wearing her CPAP, every night. Had her   ROV 09/30/15 -- follow-up visit for COPD with an asthmatic component, upper airway irritation syndrome and chronic cough. She also has a history of sleep apnea and uses CPAP, good complaining .  She had been well until about 2 weeks ago when she caught URI  from her grandchildren. She was treated with prednisone and azithro. She is now improved, feels back to baseline. She is having  gallbladder trouble, but has not pursued surgery due to her pulmonary risk. She is still considering this, but understands that her risk for prolonged ventilation is high.  Doing spiriva and symbicort, tolerates well.     11/25/2015 Acute OV : COPD w/ asthma component Pt presents for an acute office visit.  Complains of 1 week of prod cough with bloody mucus, sinus congestion, nose bleeds at times. Denies any chest tightness/congestion, SOB, wheezing, fever, nausea or vomiting.  Feels flonase has made nose irriatated, she stopped this.  Remains on Spiriva and Symbicort  On O2 at 2l/m .  Patient denies any chest pain, orthopnea, PND, leg swelling, or fever. Took abx 1 month ago for bronchitis .  She is very excited that she has quit smoking , none for 2 weeks.  Appetite is good . No n/v/d.  CXR today show BB atx vs infiltrates    ROS  Constitutional:   No  weight loss, night sweats,  Fevers, chills, fatigue, or  lassitude.  HEENT:   No headaches,  Difficulty swallowing,  Tooth/dental problems, or  Sore throat,                No sneezing, itching, ear ache,  +nasal congestion, post nasal drip,   CV:  No chest pain,  Orthopnea, PND, swelling in lower extremities, anasarca, dizziness, palpitations, syncope.   GI  No heartburn, indigestion, abdominal pain, nausea, vomiting, diarrhea, change in bowel habits, loss of appetite, bloody stools.   Resp:   No chest wall deformity  Skin: no rash or lesions.  GU: no dysuria, change in color of urine, no urgency or frequency.  No flank pain, no hematuria   MS:  No joint pain or swelling.  No decreased range of motion.  No back pain.  Psych:  No change in mood or affect. No depression or anxiety.  No memory loss.        Objective:   Physical Exam Filed Vitals:   11/25/15 0943  BP: 130/86  Pulse: 91  Temp: 97.8 F (36.6 C)  TempSrc: Oral  Height: 5' (1.524 m)  Weight: 221 lb (100.245 kg)  SpO2: 94%   Body mass index is 43.16  kg/(m^2).     Gen: Obese, in no distress,  normal affect  ENT: No lesions,  mouth clear,  oropharynx clear, no postnasal drip, hoarse voice  Neck: No JVD, no TMG, no carotid bruits, loud exp stridor and hoarse voice  Lungs: No use of accessory muscles, distant, no wheezes   Cardiovascular: RRR, heart sounds normal, no murmur or gallops, no peripheral edema  Musculoskeletal: No deformities, no cyanosis or clubbing  Neuro: alert, non focal  Skin: Warm, no lesions or rashes, old scars on B UE's   Assessment & Plan:

## 2015-12-02 ENCOUNTER — Encounter (HOSPITAL_COMMUNITY): Payer: Self-pay

## 2015-12-02 ENCOUNTER — Emergency Department (HOSPITAL_COMMUNITY): Payer: Medicaid Other

## 2015-12-02 ENCOUNTER — Emergency Department (HOSPITAL_COMMUNITY)
Admission: EM | Admit: 2015-12-02 | Discharge: 2015-12-03 | Disposition: A | Discharge status: Home / Self Care | Payer: Medicaid Other | Attending: Emergency Medicine | Admitting: Emergency Medicine

## 2015-12-02 DIAGNOSIS — Z9981 Dependence on supplemental oxygen: Secondary | ICD-10-CM | POA: Diagnosis not present

## 2015-12-02 DIAGNOSIS — Z862 Personal history of diseases of the blood and blood-forming organs and certain disorders involving the immune mechanism: Secondary | ICD-10-CM | POA: Insufficient documentation

## 2015-12-02 DIAGNOSIS — G4733 Obstructive sleep apnea (adult) (pediatric): Secondary | ICD-10-CM | POA: Insufficient documentation

## 2015-12-02 DIAGNOSIS — I10 Essential (primary) hypertension: Secondary | ICD-10-CM | POA: Insufficient documentation

## 2015-12-02 DIAGNOSIS — Z8701 Personal history of pneumonia (recurrent): Secondary | ICD-10-CM | POA: Diagnosis not present

## 2015-12-02 DIAGNOSIS — J439 Emphysema, unspecified: Secondary | ICD-10-CM | POA: Insufficient documentation

## 2015-12-02 DIAGNOSIS — F419 Anxiety disorder, unspecified: Secondary | ICD-10-CM | POA: Insufficient documentation

## 2015-12-02 DIAGNOSIS — R600 Localized edema: Secondary | ICD-10-CM | POA: Diagnosis not present

## 2015-12-02 DIAGNOSIS — Z79899 Other long term (current) drug therapy: Secondary | ICD-10-CM | POA: Insufficient documentation

## 2015-12-02 DIAGNOSIS — Z87891 Personal history of nicotine dependence: Secondary | ICD-10-CM | POA: Diagnosis not present

## 2015-12-02 DIAGNOSIS — Z9889 Other specified postprocedural states: Secondary | ICD-10-CM | POA: Diagnosis not present

## 2015-12-02 DIAGNOSIS — K219 Gastro-esophageal reflux disease without esophagitis: Secondary | ICD-10-CM | POA: Insufficient documentation

## 2015-12-02 DIAGNOSIS — Z7984 Long term (current) use of oral hypoglycemic drugs: Secondary | ICD-10-CM | POA: Diagnosis not present

## 2015-12-02 DIAGNOSIS — R05 Cough: Secondary | ICD-10-CM | POA: Diagnosis not present

## 2015-12-02 DIAGNOSIS — Z8541 Personal history of malignant neoplasm of cervix uteri: Secondary | ICD-10-CM | POA: Insufficient documentation

## 2015-12-02 DIAGNOSIS — R61 Generalized hyperhidrosis: Secondary | ICD-10-CM | POA: Insufficient documentation

## 2015-12-02 DIAGNOSIS — R739 Hyperglycemia, unspecified: Secondary | ICD-10-CM

## 2015-12-02 DIAGNOSIS — E1165 Type 2 diabetes mellitus with hyperglycemia: Secondary | ICD-10-CM | POA: Diagnosis present

## 2015-12-02 DIAGNOSIS — Z7951 Long term (current) use of inhaled steroids: Secondary | ICD-10-CM | POA: Insufficient documentation

## 2015-12-02 LAB — CBG MONITORING, ED: Glucose-Capillary: 318 mg/dL — ABNORMAL HIGH (ref 65–99)

## 2015-12-02 MED ORDER — INSULIN ASPART 100 UNIT/ML ~~LOC~~ SOLN
10.0000 [IU] | Freq: Once | SUBCUTANEOUS | Status: DC
  Filled 2015-12-02: qty 1

## 2015-12-02 NOTE — ED Provider Notes (Signed)
CSN: [id number]     Arrival date & time 12/02/15  2042 History  By signing my name below, I, Irene Pap, attest that this documentation has been prepared under the direction and in the presence of Delora Fuel, MD. Electronically Signed: Irene Pap, ED Scribe. 12/02/2015. 1:28 AM.  Chief Complaint  Patient presents with  . Hyperglycemia   The history is provided by the patient. No language interpreter was used.  HPI Comments: Stephanie Schultz is a 60 y.o. Female with a hx of HTN, OSA on CPAP, cervical cancer, sickle cell trait, pneumonia, DM, emphysema and COPD brought in by EMS who presents to the Emergency Department complaining of increased blood sugar onset 4 hours ago. She states that she checked her CBG at 8 PM and could "feel something in her face." She states that it was very elevated at 279. She report eating and drinking half a soda, then checked her CBG again and it was 349. Pt was being treated with prednisone 5 weeks ago, but finished the course 2 weeks ago. Per EMS, pt was hyperventilating when they arrived, but is calm upon ED arrival; CBG en route 390. She states that she feels fine currently. Pt reports productive cough with dark sputum, but states that it has been the same since starting Augmentin a week ago for pneumonia with nebulizer treatments. Pt recently saw the pulmonologist for hemoptysis. She reports associated night diaphoresis, polydipsia, and frequency. Pt denies fever, chills, nausea, or vomiting. Pt quit smoking 22 days ago.    Past Medical History  Diagnosis Date  . Hypertension   . Anxiety   . OSA on CPAP     CPAP at night   . Dizziness     pt believes this is motion sickness or vertigo  . GERD (gastroesophageal reflux disease)   . Headache(784.0)   . Cancer ([place]) 1990    cervical   . Anemia   . Morbid obesity ([place])   . Sickle cell trait ([place])   . Hx of cardiac catheterization     a. LHC at [facility] in [place], [place] 09/2008:  Normal coronary arteries EF  70%.  . Tobacco abuse     a. up to 3ppd from age 5 to 6, now 1/4 ppd (01/2013)  . Shortness of breath   . Pneumonia   . Diabetes mellitus without complication ([place])   . Emphysema   . COPD (chronic obstructive pulmonary disease) ([place])     dR . Rosiland Oz   Past Surgical History  Procedure Laterality Date  . Tubal ligation    . Cardiac catheterization    . Colonoscopy  09/05/2012    Procedure: COLONOSCOPY;  Surgeon: Beryle Beams, MD;  Location: [facility] ENDOSCOPY;  Service: Endoscopy;  Laterality: N/A;  . Colonoscopy with propofol N/A 09/02/2015    Procedure: COLONOSCOPY WITH PROPOFOL;  Surgeon: Carol Ada, MD;  Location: [facility] ENDOSCOPY;  Service: Endoscopy;  Laterality: N/A;   Family History  Problem Relation Age of Onset  . Lung cancer Paternal Aunt   . Lung cancer Paternal Grandfather   . Other Father     unaware of father's medical history  . Diabetes Mother     alive @ 36  . Myasthenia gravis Mother   . Other      multiple siblings a&w.   Social History  Substance Use Topics  . Smoking status: Former Smoker -- 0.50 packs/day for 36 years    Types: Cigarettes    Quit date: 11/11/2015  .  Smokeless tobacco: Never Used     Comment: Approx 90 pk-yrs (up to 3ppd until ~ 2009). Smoking 3 cigs per day now.  . Alcohol Use: No   OB History    No data available     Review of Systems  Constitutional: Positive for diaphoresis. Negative for fever and chills.  Respiratory: Positive for cough.   Gastrointestinal: Negative for nausea and vomiting.  Endocrine: Positive for polydipsia.  Genitourinary: Positive for frequency.  All other systems reviewed and are negative.  Allergies  Acetaminophen  Home Medications   Prior to Admission medications   Medication Sig Start Date End Date Taking? Authorizing Provider  albuterol (PROVENTIL) (2.5 MG/3ML) 0.083% nebulizer solution Take 3 mLs (2.5 mg total) by nebulization 4 (four) times daily. DX J44.9 Patient taking differently:  Take 2.5 mg by nebulization every 6 (six) hours as needed for wheezing or shortness of breath. DX J44.9 05/03/15  Yes Collene Gobble, MD  allopurinol (ZYLOPRIM) 100 MG tablet Take 100 mg by mouth daily.   Yes Historical Provider, MD  ALPRAZolam Duanne Moron) 0.5 MG tablet Take 1 tablet (0.5 mg total) by mouth 2 (two) times daily as needed for anxiety. 02/22/15  Yes Bonnielee Haff, MD  amoxicillin-clavulanate (AUGMENTIN) 875-125 MG tablet Take 1 tablet by mouth 2 (two) times daily. 11/25/15 12/02/15 Yes Tammy S Parrett, NP  budesonide-formoterol (SYMBICORT) 160-4.5 MCG/ACT inhaler Inhale 2 puffs into the lungs 2 (two) times daily. 05/03/15  Yes Collene Gobble, MD  fluconazole (DIFLUCAN) 100 MG tablet Take 100 mg by mouth daily as needed (Yeast infection).   Yes Historical Provider, MD  glipiZIDE (GLUCOTROL) 10 MG tablet Take 10 mg by mouth daily before breakfast.   Yes Historical Provider, MD  HYDROcodone-acetaminophen (NORCO) 7.5-325 MG per tablet Take 1 tablet by mouth every 4 (four) hours as needed for moderate pain or severe pain. Patient taking differently: Take 1 tablet by mouth every 8 (eight) hours as needed for moderate pain or severe pain.  04/01/15  Yes Thurnell Lose, MD  ibuprofen (ADVIL,MOTRIN) 800 MG tablet Take 800 mg by mouth every 8 (eight) hours as needed for moderate pain.    Yes Historical Provider, MD  ipratropium (ATROVENT) 0.02 % nebulizer solution USE 1 VIAL IN NEBULIZER EVERY 6 HOURS AS NEEDED FOR WHEEZING OR SHORTNESS OF BREATH. 10/24/15  Yes Collene Gobble, MD  Linaclotide (LINZESS) 145 MCG CAPS capsule Take 145 mcg by mouth every morning.   Yes Historical Provider, MD  loratadine (CLARITIN) 10 MG tablet Take 10 mg by mouth daily.   Yes Historical Provider, MD  NEXIUM 40 MG capsule TAKE ONE CAPSULE BY MOUTH DAILY AT 12 NOON 07/05/15  Yes Collene Gobble, MD  nicotine (NICODERM CQ - DOSED IN MG/24 HOURS) 21 mg/24hr patch Place 21 mg onto the skin daily.  11/14/15  Yes Historical Provider,  MD  nystatin cream (MYCOSTATIN) Apply 1 application topically 2 (two) times daily. Yeast infection   Yes Historical Provider, MD  Olopatadine HCl (PAZEO) 0.7 % SOLN Apply 1 drop to eye daily.   Yes Historical Provider, MD  pregabalin (LYRICA) 75 MG capsule Take 75 mg by mouth 2 (two) times daily.   Yes Historical Provider, MD  PROAIR HFA 108 (90 BASE) MCG/ACT inhaler INHALE 2 PUFFS BY MOUTH EVERY 4 HOURS AS NEEDED FOR WHEEZING 09/23/15  Yes Collene Gobble, MD  tiotropium (SPIRIVA) 18 MCG inhalation capsule Place 1 capsule (18 mcg total) into inhaler and inhale daily. 05/03/15  Yes Herbie Baltimore  Agustina Caroli, MD  triamterene-hydrochlorothiazide (DYAZIDE) 37.5-25 MG per capsule Take 1 capsule by mouth daily.   Yes Historical Provider, MD  zolpidem (AMBIEN) 10 MG tablet Take 10 mg by mouth at bedtime.   Yes Historical Provider, MD  fluticasone (FLONASE) 50 MCG/ACT nasal spray INHALE 2 SPRAYS IN EACH NOSTRIL ONCE DAILY Patient not taking: Reported on 11/25/2015 10/24/15   Collene Gobble, MD   BP 122/70 mmHg  Pulse 94  Temp(Src) 98.2 F (36.8 C) (Oral)  Resp 20  SpO2 95%  LMP 06/04/2000 (LMP Unknown) Physical Exam  Constitutional: She is oriented to person, place, and time. She appears well-developed and well-nourished.  HENT:  Head: Normocephalic and atraumatic.  Cushingoid facies  Eyes: EOM are normal. Pupils are equal, round, and reactive to light.  Neck: Normal range of motion. Neck supple. No JVD present.  Cardiovascular: Normal rate, regular rhythm and normal heart sounds.  Exam reveals no gallop and no friction rub.   No murmur heard. Pulmonary/Chest: Effort normal and breath sounds normal. She has no wheezes. She has no rales. She exhibits no tenderness.  Abdominal: Soft. Bowel sounds are normal. She exhibits no distension and no mass. There is no tenderness.  Musculoskeletal: Normal range of motion.  1+ pitting edema bilaterally with mild venous stasis changes  Lymphadenopathy:    She has no  cervical adenopathy.  Neurological: She is alert and oriented to person, place, and time. No cranial nerve deficit. She exhibits normal muscle tone. Coordination normal.  Skin: Skin is warm and dry. No rash noted.  Psychiatric: She has a normal mood and affect. Her behavior is normal. Judgment and thought content normal.  Nursing note and vitals reviewed.   ED Course  Procedures (including critical care time) DIAGNOSTIC STUDIES: Oxygen Saturation is 95% on home O2, adequate by my interpretation.    COORDINATION OF CARE: 11:48 PM-Discussed treatment plan which includes labs with pt at bedside and pt agreed to plan.    Labs Review Results for orders placed or performed during the hospital encounter of 12/02/15  Urinalysis, Routine w reflex microscopic  Result Value Ref Range   Color, Urine YELLOW YELLOW   APPearance CLEAR CLEAR   Specific Gravity, Urine 1.026 1.005 - 1.030   pH 6.0 5.0 - 8.0   Glucose, UA >1000 (A) NEGATIVE mg/dL   Hgb urine dipstick NEGATIVE NEGATIVE   Bilirubin Urine NEGATIVE NEGATIVE   Ketones, ur NEGATIVE NEGATIVE mg/dL   Protein, ur NEGATIVE NEGATIVE mg/dL   Nitrite NEGATIVE NEGATIVE   Leukocytes, UA NEGATIVE NEGATIVE  Urine microscopic-add on  Result Value Ref Range   Squamous Epithelial / LPF 6-30 (A) NONE SEEN   WBC, UA NONE SEEN 0 - 5 WBC/hpf   RBC / HPF 0-5 0 - 5 RBC/hpf   Bacteria, UA NONE SEEN NONE SEEN  CBG monitoring, ED  Result Value Ref Range   Glucose-Capillary 318 (H) 65 - 99 mg/dL  CBG monitoring, ED  Result Value Ref Range   Glucose-Capillary 213 (H) 65 - 99 mg/dL   Comment 1 Notify RN    Imaging Review Dg Chest 2 View  12/03/2015  CLINICAL DATA:  Acute onset of shortness of breath. Initial encounter. EXAM: CHEST  2 VIEW COMPARISON:  Chest radiograph from 11/25/2015 FINDINGS: The lungs are well-aerated. Vascular congestion is noted. Mild bilateral atelectasis is noted. There is no evidence of pleural effusion or pneumothorax. Left  lateral pleural thickening appears to have first arisen in July 2016 shortly after left-sided rib  fractures, and likely reflects posttraumatic pleural changes. The heart is normal in size; the mediastinal contour is within normal limits. No acute osseous abnormalities are seen. IMPRESSION: Vascular congestion noted.  Mild bilateral atelectasis noted. Electronically Signed   By: Garald Balding M.D.   On: 12/03/2015 00:59   I have personally reviewed and evaluated these images and lab results as part of my medical decision-making.   MDM   Final diagnoses:  Hyperglycemia    Hyperglycemia. Old records are reviewed and she was seen in the physician's office on [place] 13[address] and started on Augmentin for x-ray report of pneumonia. X-ray was repeated showing no evidence of pneumonia. Blood draws were unsuccessful but urinalysis was checked showing no evidence of ketonuria. Blood sugar has come down to 213 without treatment. No indication for insulin administration. Patient is advised to monitor her blood sugars at home and see her PCP in follow-up to discuss possible adjustment in her medications.  I personally performed the services described in this documentation, which was scribed in my presence. The recorded information has been reviewed and is accurate.      Delora Fuel, MD [phone]

## 2015-12-02 NOTE — ED Notes (Signed)
Bed: WA07 Expected date:  Expected time:  Means of arrival:  Comments: EMS 39F CBG 390/CP on home O2

## 2015-12-02 NOTE — ED Notes (Signed)
Pt transported from home with c/o hyperglycemia, pt being tx for PNA with antbx and prednisone. Per EMS pt hyperventilating on their arrival, pt calm on arrival to ED, pt does have child with her that was in her care. Unable to obtain IV, CBG 390.

## 2015-12-03 LAB — URINE MICROSCOPIC-ADD ON
Bacteria, UA: NONE SEEN
WBC UA: NONE SEEN WBC/hpf (ref 0–5)

## 2015-12-03 LAB — URINALYSIS, ROUTINE W REFLEX MICROSCOPIC
Bilirubin Urine: NEGATIVE
Hgb urine dipstick: NEGATIVE
KETONES UR: NEGATIVE mg/dL
LEUKOCYTES UA: NEGATIVE
NITRITE: NEGATIVE
PROTEIN: NEGATIVE mg/dL
Specific Gravity, Urine: 1.026 (ref 1.005–1.030)
pH: 6 (ref 5.0–8.0)

## 2015-12-03 LAB — CBG MONITORING, ED: GLUCOSE-CAPILLARY: 213 mg/dL — AB (ref 65–99)

## 2015-12-03 NOTE — Discharge Instructions (Signed)
Monitor your blood sugars at home and keep a record of them. Bring that record with you when you see your doctor.  Hyperglycemia Hyperglycemia occurs when the glucose (sugar) in your blood is too high. Hyperglycemia can happen for many reasons, but it most often happens to people who do not know they have diabetes or are not managing their diabetes properly.  CAUSES  Whether you have diabetes or not, there are other causes of hyperglycemia. Hyperglycemia can occur when you have diabetes, but it can also occur in other situations that you might not be as aware of, such as: Diabetes  If you have diabetes and are having problems controlling your blood glucose, hyperglycemia could occur because of some of the following reasons:  Not following your meal plan.  Not taking your diabetes medications or not taking it properly.  Exercising less or doing less activity than you normally do.  Being sick. Pre-diabetes  This cannot be ignored. Before people develop Type 2 diabetes, they almost always have "pre-diabetes." This is when your blood glucose levels are higher than normal, but not yet high enough to be diagnosed as diabetes. Research has shown that some long-term damage to the body, especially the heart and circulatory system, may already be occurring during pre-diabetes. If you take action to manage your blood glucose when you have pre-diabetes, you may delay or prevent Type 2 diabetes from developing. Stress  If you have diabetes, you may be "diet" controlled or on oral medications or insulin to control your diabetes. However, you may find that your blood glucose is higher than usual in the hospital whether you have diabetes or not. This is often referred to as "stress hyperglycemia." Stress can elevate your blood glucose. This happens because of hormones put out by the body during times of stress. If stress has been the cause of your high blood glucose, it can be followed regularly by your  caregiver. That way he/she can make sure your hyperglycemia does not continue to get worse or progress to diabetes. Steroids  Steroids are medications that act on the infection fighting system (immune system) to block inflammation or infection. One side effect can be a rise in blood glucose. Most people can produce enough extra insulin to allow for this rise, but for those who cannot, steroids make blood glucose levels go even higher. It is not unusual for steroid treatments to "uncover" diabetes that is developing. It is not always possible to determine if the hyperglycemia will go away after the steroids are stopped. A special blood test called an A1c is sometimes done to determine if your blood glucose was elevated before the steroids were started. SYMPTOMS  Thirsty.  Frequent urination.  Dry mouth.  Blurred vision.  Tired or fatigue.  Weakness.  Sleepy.  Tingling in feet or leg. DIAGNOSIS  Diagnosis is made by monitoring blood glucose in one or all of the following ways:  A1c test. This is a chemical found in your blood.  Fingerstick blood glucose monitoring.  Laboratory results. TREATMENT  First, knowing the cause of the hyperglycemia is important before the hyperglycemia can be treated. Treatment may include, but is not be limited to:  Education.  Change or adjustment in medications.  Change or adjustment in meal plan.  Treatment for an illness, infection, etc.  More frequent blood glucose monitoring.  Change in exercise plan.  Decreasing or stopping steroids.  Lifestyle changes. HOME CARE INSTRUCTIONS   Test your blood glucose as directed.  Exercise regularly.  Your caregiver will give you instructions about exercise. Pre-diabetes or diabetes which comes on with stress is helped by exercising.  Eat wholesome, balanced meals. Eat often and at regular, fixed times. Your caregiver or nutritionist will give you a meal plan to guide your sugar intake.  Being at  an ideal weight is important. If needed, losing as little as 10 to 15 pounds may help improve blood glucose levels. SEEK MEDICAL CARE IF:   You have questions about medicine, activity, or diet.  You continue to have symptoms (problems such as increased thirst, urination, or weight gain). SEEK IMMEDIATE MEDICAL CARE IF:   You are vomiting or have diarrhea.  Your breath smells fruity.  You are breathing faster or slower.  You are very sleepy or incoherent.  You have numbness, tingling, or pain in your feet or hands.  You have chest pain.  Your symptoms get worse even though you have been following your caregiver's orders.  If you have any other questions or concerns.   This information is not intended to replace advice given to you by your health care provider. Make sure you discuss any questions you have with your health care provider.   Document Released: 04/24/2001 Document Revised: 01/21/2012 Document Reviewed: 07/05/2015 Elsevier Interactive Patient Education [employer].

## 2015-12-07 ENCOUNTER — Encounter (HOSPITAL_COMMUNITY): Payer: Self-pay

## 2015-12-07 NOTE — Patient Instructions (Addendum)
YOUR PROCEDURE IS SCHEDULED ON :  12/13/15  REPORT TO [facility] MAIN ENTRANCE FOLLOW SIGNS TO EAST ELEVATOR - GO TO 3rd FLOOR CHECK IN AT 3 EAST NURSES STATION (SHORT STAY) AT:  5:30 AM  CALL THIS NUMBER IF YOU HAVE PROBLEMS THE MORNING OF SURGERY [phone]  REMEMBER:ONLY 1 PER PERSON MAY GO TO SHORT STAY WITH YOU TO GET READY THE MORNING OF YOUR SURGERY  DO NOT EAT FOOD OR DRINK LIQUIDS AFTER MIDNIGHT  TAKE THESE MEDICINES THE MORNING OF SURGERY: ALLOPURINOL / SYMBICORT / ATROVENT / CLARITIN / NEXIUM / LYRICA  MAY USE ALBUTEROL IF NEEDED / MAY TAKE ALPRAZOLAM IF NEEDED / MAY TAKE TAKE HYDROCODONE IF NEEDED  BRING C-PAP TUBING  TO HOSPITAL  YOU MAY NOT HAVE ANY METAL ON YOUR BODY INCLUDING HAIR PINS AND PIERCING'S. DO NOT WEAR JEWELRY, MAKEUP, LOTIONS, POWDERS OR PERFUMES. DO NOT WEAR NAIL POLISH. DO NOT SHAVE 48 HRS PRIOR TO SURGERY. MEN MAY SHAVE FACE AND NECK.  DO NOT [place]. [facility] IS NOT RESPONSIBLE FOR VALUABLES.  CONTACTS, DENTURES OR PARTIALS MAY NOT BE WORN TO SURGERY. LEAVE SUITCASE IN CAR. CAN BE BROUGHT TO ROOM AFTER SURGERY.  PATIENTS DISCHARGED THE DAY OF SURGERY WILL NOT BE ALLOWED TO DRIVE HOME.  PLEASE READ OVER THE FOLLOWING INSTRUCTION SHEETS _________________________________________________________________________________                                          [place] - PREPARING FOR SURGERY  Before surgery, you can play an important role.  Because skin is not sterile, your skin needs to be as free of germs as possible.  You can reduce the number of germs on your skin by washing with CHG (chlorahexidine gluconate) soap before surgery.  CHG is an antiseptic cleaner which kills germs and bonds with the skin to continue killing germs even after washing. Please DO NOT use if you have an allergy to CHG or antibacterial soaps.  If your skin becomes reddened/irritated stop using the CHG and inform your nurse when you  arrive at Short Stay. Do not shave (including legs and underarms) for at least 48 hours prior to the first CHG shower.  You may shave your face. Please follow these instructions carefully:   1.  Shower with CHG Soap the night before surgery and the  morning of Surgery.   2.  If you choose to wash your hair, wash your hair first as usual with your  normal  Shampoo.   3.  After you shampoo, rinse your hair and body thoroughly to remove the  shampoo.                                         4.  Use CHG as you would any other liquid soap.  You can apply chg directly  to the skin and wash . Gently wash with scrungie or clean wascloth    5.  Apply the CHG Soap to your body ONLY FROM THE NECK DOWN.   Do not use on open                           Wound or open sores. Avoid contact with eyes, ears mouth  and genitals (private parts).                        Genitals (private parts) with your normal soap.              6.  Wash thoroughly, paying special attention to the area where your surgery  will be performed.   7.  Thoroughly rinse your body with warm water from the neck down.   8.  DO NOT shower/wash with your normal soap after using and rinsing off  the CHG Soap .                9.  Pat yourself dry with a clean towel.             10.  Wear clean night clothes to bed after shower             11.  Place clean sheets on your bed the night of your first shower and do not  sleep with pets.  Day of Surgery : Do not apply any lotions/deodorants the morning of surgery.  Please wear clean clothes to the hospital/surgery center.  FAILURE TO FOLLOW THESE INSTRUCTIONS MAY RESULT IN THE CANCELLATION OF YOUR SURGERY    PATIENT SIGNATURE_________________________________  ______________________________________________________________________

## 2015-12-08 ENCOUNTER — Encounter (HOSPITAL_COMMUNITY)
Admission: RE | Admit: 2015-12-08 | Discharge: 2015-12-08 | Disposition: A | Discharge status: Home / Self Care | Payer: Medicaid Other | Source: Ambulatory Visit | Attending: General Surgery | Admitting: General Surgery

## 2015-12-08 ENCOUNTER — Encounter (HOSPITAL_COMMUNITY): Payer: Self-pay

## 2015-12-08 DIAGNOSIS — Z01812 Encounter for preprocedural laboratory examination: Secondary | ICD-10-CM | POA: Diagnosis present

## 2015-12-08 HISTORY — DX: Gout, unspecified: M10.9

## 2015-12-08 HISTORY — DX: Hyperlipidemia, unspecified: E78.5

## 2015-12-08 HISTORY — DX: Unspecified osteoarthritis, unspecified site: M19.90

## 2015-12-08 HISTORY — DX: Calculus of gallbladder without cholecystitis without obstruction: K80.20

## 2015-12-08 LAB — CBC
HCT: 35.4 % — ABNORMAL LOW (ref 36.0–46.0)
Hemoglobin: 11.5 g/dL — ABNORMAL LOW (ref 12.0–15.0)
MCH: 27.3 pg (ref 26.0–34.0)
MCHC: 32.5 g/dL (ref 30.0–36.0)
MCV: 84.1 fL (ref 78.0–100.0)
PLATELETS: 246 10*3/uL (ref 150–400)
RBC: 4.21 MIL/uL (ref 3.87–5.11)
RDW: 15.6 % — ABNORMAL HIGH (ref 11.5–15.5)
WBC: 6 10*3/uL (ref 4.0–10.5)

## 2015-12-08 LAB — BASIC METABOLIC PANEL
ANION GAP: 11 (ref 5–15)
BUN: 17 mg/dL (ref 6–20)
CO2: 26 mmol/L (ref 22–32)
Calcium: 9.5 mg/dL (ref 8.9–10.3)
Chloride: 101 mmol/L (ref 101–111)
Creatinine, Ser: 1.03 mg/dL — ABNORMAL HIGH (ref 0.44–1.00)
GFR calc Af Amer: >60 mL/min (ref >60)
GFR, EST NON AFRICAN AMERICAN: 59 mL/min — AB (ref >60)
Glucose, Bld: 201 mg/dL — ABNORMAL HIGH (ref 65–99)
POTASSIUM: 4.2 mmol/L (ref 3.5–5.1)
SODIUM: 138 mmol/L (ref 135–145)

## 2015-12-09 ENCOUNTER — Encounter: Payer: Self-pay | Admitting: Adult Health

## 2015-12-09 ENCOUNTER — Ambulatory Visit (INDEPENDENT_AMBULATORY_CARE_PROVIDER_SITE_OTHER): Payer: Medicaid Other | Admitting: Adult Health

## 2015-12-09 VITALS — BP 122/78 | HR 92 | Temp 98.1°F | Ht 60.0 in | Wt 223.0 lb

## 2015-12-09 DIAGNOSIS — J449 Chronic obstructive pulmonary disease, unspecified: Secondary | ICD-10-CM

## 2015-12-09 DIAGNOSIS — J9611 Chronic respiratory failure with hypoxia: Secondary | ICD-10-CM

## 2015-12-09 LAB — HEMOGLOBIN A1C
HEMOGLOBIN A1C: 8.3 % — AB (ref 4.8–5.6)
Mean Plasma Glucose: 192 mg/dL

## 2015-12-09 NOTE — Progress Notes (Signed)
Subjective:    Patient ID: Stephanie Schultz, female    DOB: 03/19/1956, 60 y.o.   MRN: [id number]  HPI 60 yo smoker (90 pk-yrs, has cut down to about 2 cig a day), hx HTN, borderline DM, allergies. Dx with COPD at [facility] in [place]. She was started on O2 in ~2009. She had PFT within the last year. She remains fairly active as long as she wears her O2. She can walk about 30-40 feet, has to stop when shopping to rest. She has daily cough, productive of white phlegm. She has rare exacerbations, last was in Jan 2012.   ROV 07/05/11 -- COPD, OSA. Her data from [place] showed she needs CPAP 10. We ordered O2 thru [facility]. She continues to smoke 3 cigs a day. She has been coughing more, having more sputum - yellow/white sputum.  She is having more dyspnea, more wheezing x 3 weeks. She has gained 30+ lbs since coming to [place]. She has not been wearing her O2 reliably.   ROV 09/20/11 -- COPD, OSA. Returns for f/u. Tells me that she has been rx for COPD exacerbation x 2 since our last visit. She tells me that her CXR showed "a spot on one of her lungs". Was done at [facility] on 08/23/11. She was treated with pred and azithro on each occasion. She is on Advair and Spiriva. Continues to have severe cough, costochondritic pain. The pred and azithro helped some but not completely. Ran out loratadine 2 weeks ago. Still on omeprazole bid. She is wearing CPAP 10 reliably.   ROV 10/25/11 -- COPD, OSA on CPAP 10. Last time we started [facility] in place of Advair to see if this helped w UA sx. Still on Spiriva. Restarted loratadine and continued omeprazole bid. She feels that her breathing is the same, cough is a bit better on the [facility]. No flares, but she is still having nasal gtt despite the loratadine.   12/17/2011 Acute OV  Pt presents for an acute office visit, complains of increased SOB, wheezing, prod cough with yellow-to-brown mucus  2 weeks . Went to ED on 1.23.13 and was given zpak and prednisone 50mg  x 4 days. CXR with  chronic changing. Has finished abx and steroids. Got some better but cough never went away. Still smoking 1/2 PPD (takes O2 off -goes outside to smoke) .  Remains on Dulera and Spiriva  With no missed doses.  OTC not helping.  Wants referral for home health.  Encouraged to quit smoking.   ROV 02/18/12 -- COPD, OSA on CPAP, chronic cough. Had an AE with residual cough as described above 2/13. Remains on Spiriva + Dulera. Remains on omeprazole bid, flonase, not using loratadine right now. Still smoking about 3 cig a day. She is interested in trying the nicotine patches  ROV 07/08/12 -- COPD, OSA on CPAP, chronic cough. She reports that she quit smoking July 3! She has gained 60 lbs over the last year. Her breathing is stable, still limited. She is wearing her O2 and her CPAP. Continues to take Spiriva + Dulera. She reports that she is still having GERD sx even on omeprazole bid. She ran out of flonase 1 month ago. She has had some epistaxis on R.   ROV 11/25/12 -- COPD, OSA on CPAP, chronic cough. Since last time she restarted smoking - currently at 5 cig a day. She ran out of her [name] a week ago. Still taking spiriva. She has been having more cough over about  2 weeks. She has been using albuterol nebs bid - about to run out of it, need refill. She remains on loratadine, fluticasone, omeprazole. She hasn't been using CPAP regularly - needs new mask, this is being arranged. She is to undergo breast bx for a nodule this week.    [facility] follow up 12/19/12 Patient returns for post hospital followup. Patient was admitted January 24-27th 2014 for COPD, exacerbation. She was treated with IV antibiotics, nebulized bronchodilators, and IV steroids.  She was discharged on a steroid taper, along with Levaquin.  Since discharge. She has had  some improvement however, continues to have productive cough, with some thick, yellow. Mucus. Unfortunately patient has restarted smoking. Smoking cessation education was  given. Chest x-ray today shows no acute process Patient denies any hemoptysis, orthopnea, PND, or leg swelling.   03/11/13 -- COPD, OSA on CPAP, chronic cough, tobacco use. Admitted 3/6 - 3/11 for AE-COPD. She feels better. She is having some nasal congestion, on loratadine + nasal steroid. ENT saw her and increased her PPI. She is wearing her CPAP.   ER Follow up 06/12/13 --  pt reports breathing is doing much better since last ED visit 7/10 w SOB given pred and abx but did not take abx-- wearing CPAP 1-2 nights per week-- states it makes her feel claustrophobic-- denies any other concerns at this time Cough and congestion have improved. Shortness, of breath is also decreased. Patient denies any hemoptysis, orthopnea, PND, or increased leg swelling  ROV 12/08/13 -- COPD, OSA on CPAP, chronic cough, tobacco use.  She underwent PFT on 11/16/13 > severe AFL, no BD response, superimposed restriction, decreased DLCO.  Her FEV1 is stable from '09, FVC is decrease slightly. She is upset today because she was unable to be seen on 1/5 when she was here for PFT and was sick. She is also upset that I didn't come to see her when she was admitted to hospital since last Summer. She is still smoking a few cigarettes a day. Remains on Spiriva, symbicort, duonebs prn. Her O2 is set at  2L/min. She is wearing CPAP reliably   ROV 07/06/14 -- follows for severe COPD, OSA, cough, continued tobacco. She was seen by Dr Annamaria Boots in 6/15, and then again at the ED 8/24 (yesterday) for dyspnea, no chest tightness, no cough. She was given solumedrol and a script for a pred taper > she hasn't filled it.  She has taken her spiriva and symbicort. She is reliable with her CPAP mask.  She is on omeprazole and loratadine.    ROV 08/03/14 -- severe COPD, OSA on CPAP, cough, continued tobacco use, about 3 cigarettes daily. Also some component UA disease. She continues to cough and have midfollow up for  sternal chest pain. She is reliable with her  omeprazole 40.  She has been using albuterol.   ROV 01/10/15 -- follow up for severe COPD OSA, cough and tobacco use.  She follows up today following treatment for an apparent AE of her COPD and UA disease. She is doing a lot of coughing, causing mid abd and back soreness. She c/o some baseline soreness and chest tightness. She has loratdine, has not been nasal steroid for several weeks. She is on omeprazole.   ROV 04/18/15 -- follow-up visit for COPD/asthma, chronic cough and upper airway irritation, tobacco use, and obstructive sleep apnea on CPAP. She was admitted to the hospital 4/7-12 and then 5/19-20 with AE-COPD and L rib fractures respectively. She is smoking  up to 1/2 pk a day, often aroun 3-4 cig a day. She is using symbicort + spiriva.  She is using albuterol on a schedule as well qid. She is also on flonase bid, loratadine. She is not wearing her CPAP, has not worn in in a month.  Feels close to her usual baseline. Still having L rib pain.   I reviewed the hospital notes from April and May.  ROV 06/21/15 -- follow-up visit for chronic cough, upper airway irritation syndrome, COPD with a bronchodilator response, and obstructive sleep apnea on CPAP.   She has unfortunately had rib fx's. She is still smoking, has had some stretches of 8-13 days when she has not smoked any. He breathing has been better when she is off the cigarettes. She is using her albuterol at least 2x a day. She is on flonase, loratadine. She has had to stop the flonase sometimes due nasal irritation. She is wearing her CPAP, every night. Had her   ROV 09/30/15 -- follow-up visit for COPD with an asthmatic component, upper airway irritation syndrome and chronic cough. She also has a history of sleep apnea and uses CPAP, good complaining .  She had been well until about 2 weeks ago when she caught URI  from her grandchildren. She was treated with prednisone and azithro. She is now improved, feels back to baseline. She is having  gallbladder trouble, but has not pursued surgery due to her pulmonary risk. She is still considering this, but understands that her risk for prolonged ventilation is high.  Doing spiriva and symbicort, tolerates well.     12/09/2015 Follow up  : COPD w/ asthma component Returns for 2 week follow up . Had COPD flare last ov , tx w/ Augmentin.  She is feeling better . Still has occasional dry cough, wheezing, and chest congestion at times. Denies any sinus pressure/drainage, chest tightness, SOB, fever, nausea or vomiting.  Complains on nasal stuffiness.  Remains on Spiriva and Symbicort  On O2 at 2l/m .  Patient denies any chest pain, orthopnea, PND, leg swelling, or fever.  says she has not smoked since 11/11/15. Congratulated on cessation   ROS  Constitutional:   No  weight loss, night sweats,  Fevers, chills, fatigue, or  lassitude.  HEENT:   No headaches,  Difficulty swallowing,  Tooth/dental problems, or  Sore throat,                No sneezing, itching, ear ache,  +nasal congestion, post nasal drip,   CV:  No chest pain,  Orthopnea, PND, swelling in lower extremities, anasarca, dizziness, palpitations, syncope.   GI  No heartburn, indigestion, abdominal pain, nausea, vomiting, diarrhea, change in bowel habits, loss of appetite, bloody stools.   Resp:   No chest wall deformity  Skin: no rash or lesions.  GU: no dysuria, change in color of urine, no urgency or frequency.  No flank pain, no hematuria   MS:  No joint pain or swelling.  No decreased range of motion.  No back pain.  Psych:  No change in mood or affect. No depression or anxiety.  No memory loss.        Objective:   Physical Exam Filed Vitals:   12/09/15 1631  BP: 122/78  Pulse: 92  Temp: 98.1 F (36.7 C)  TempSrc: Oral  Height: 5' (1.524 m)  Weight: 223 lb (101.152 kg)  SpO2: 93%   Body mass index is 43.55 kg/(m^2).     Gen:  Obese, in no distress,  normal affect  ENT: No lesions,  mouth clear,   oropharynx clear, no postnasal drip  Neck: No JVD, no TMG, no carotid bruits, loud exp stridor and hoarse voice  Lungs: No use of accessory muscles, distant, no wheezes   Cardiovascular: RRR, heart sounds normal, no murmur or gallops, no peripheral edema  Musculoskeletal: No deformities, no cyanosis or clubbing  Neuro: alert, non focal  Skin: Warm, no lesions or rashes, old scars on B UE's   Assessment & Plan:

## 2015-12-09 NOTE — Patient Instructions (Addendum)
Mucinex DM Twice daily  As needed  Cough/congestion  Saline nasal spray and gel As needed   Continue on Symbicort and Spiriva .  Continue on Oxygen 2l/m  Follow up with [name] next month as planned  and As needed   Please contact office for sooner follow up if symptoms do not improve or worsen or seek emergency care

## 2015-12-13 ENCOUNTER — Encounter (HOSPITAL_COMMUNITY)
Admission: RE | Disposition: A | Discharge status: Home / Self Care | Payer: Self-pay | Source: Ambulatory Visit | Attending: General Surgery

## 2015-12-13 ENCOUNTER — Ambulatory Visit (HOSPITAL_COMMUNITY): Payer: Medicaid Other | Admitting: Registered Nurse

## 2015-12-13 ENCOUNTER — Inpatient Hospital Stay (HOSPITAL_COMMUNITY)
Admission: RE | Admit: 2015-12-13 | Discharge: 2015-12-16 | DRG: 417 | Disposition: A | Discharge status: Home / Self Care | Payer: Medicaid Other | Source: Ambulatory Visit | Attending: General Surgery | Admitting: General Surgery

## 2015-12-13 ENCOUNTER — Encounter (HOSPITAL_COMMUNITY): Payer: Self-pay | Admitting: *Deleted

## 2015-12-13 DIAGNOSIS — Z01812 Encounter for preprocedural laboratory examination: Secondary | ICD-10-CM

## 2015-12-13 DIAGNOSIS — Z8249 Family history of ischemic heart disease and other diseases of the circulatory system: Secondary | ICD-10-CM

## 2015-12-13 DIAGNOSIS — Z79891 Long term (current) use of opiate analgesic: Secondary | ICD-10-CM

## 2015-12-13 DIAGNOSIS — Z79899 Other long term (current) drug therapy: Secondary | ICD-10-CM

## 2015-12-13 DIAGNOSIS — K219 Gastro-esophageal reflux disease without esophagitis: Secondary | ICD-10-CM | POA: Diagnosis present

## 2015-12-13 DIAGNOSIS — J449 Chronic obstructive pulmonary disease, unspecified: Secondary | ICD-10-CM | POA: Diagnosis present

## 2015-12-13 DIAGNOSIS — Z9981 Dependence on supplemental oxygen: Secondary | ICD-10-CM

## 2015-12-13 DIAGNOSIS — E662 Morbid (severe) obesity with alveolar hypoventilation: Secondary | ICD-10-CM | POA: Diagnosis present

## 2015-12-13 DIAGNOSIS — F419 Anxiety disorder, unspecified: Secondary | ICD-10-CM | POA: Diagnosis present

## 2015-12-13 DIAGNOSIS — J9621 Acute and chronic respiratory failure with hypoxia: Secondary | ICD-10-CM | POA: Diagnosis not present

## 2015-12-13 DIAGNOSIS — E119 Type 2 diabetes mellitus without complications: Secondary | ICD-10-CM | POA: Diagnosis present

## 2015-12-13 DIAGNOSIS — Z6841 Body Mass Index (BMI) 40.0 and over, adult: Secondary | ICD-10-CM

## 2015-12-13 DIAGNOSIS — D573 Sickle-cell trait: Secondary | ICD-10-CM | POA: Diagnosis present

## 2015-12-13 DIAGNOSIS — J811 Chronic pulmonary edema: Secondary | ICD-10-CM | POA: Diagnosis present

## 2015-12-13 DIAGNOSIS — K8020 Calculus of gallbladder without cholecystitis without obstruction: Principal | ICD-10-CM | POA: Diagnosis present

## 2015-12-13 DIAGNOSIS — I10 Essential (primary) hypertension: Secondary | ICD-10-CM | POA: Diagnosis present

## 2015-12-13 DIAGNOSIS — Z9049 Acquired absence of other specified parts of digestive tract: Secondary | ICD-10-CM

## 2015-12-13 DIAGNOSIS — Z886 Allergy status to analgesic agent status: Secondary | ICD-10-CM

## 2015-12-13 DIAGNOSIS — J9811 Atelectasis: Secondary | ICD-10-CM

## 2015-12-13 HISTORY — PX: CHOLECYSTECTOMY: SHX55

## 2015-12-13 LAB — GLUCOSE, CAPILLARY
GLUCOSE-CAPILLARY: 134 mg/dL — AB (ref 65–99)
Glucose-Capillary: 140 mg/dL — ABNORMAL HIGH (ref 65–99)
Glucose-Capillary: 158 mg/dL — ABNORMAL HIGH (ref 65–99)
Glucose-Capillary: 158 mg/dL — ABNORMAL HIGH (ref 65–99)

## 2015-12-13 SURGERY — LAPAROSCOPIC CHOLECYSTECTOMY
Anesthesia: General

## 2015-12-13 MED ORDER — EPHEDRINE SULFATE 50 MG/ML IJ SOLN
INTRAMUSCULAR | Status: AC
  Filled 2015-12-13: qty 1

## 2015-12-13 MED ORDER — ONDANSETRON HCL 4 MG/2ML IJ SOLN
INTRAMUSCULAR | Status: AC
  Filled 2015-12-13: qty 2

## 2015-12-13 MED ORDER — LINACLOTIDE 145 MCG PO CAPS
145.0000 ug | ORAL_CAPSULE | Freq: Every day | ORAL | Status: DC
  Administered 2015-12-14 – 2015-12-16 (×3): 145 ug via ORAL
  Filled 2015-12-13 (×4): qty 1

## 2015-12-13 MED ORDER — SUGAMMADEX SODIUM 200 MG/2ML IV SOLN
INTRAVENOUS | Status: DC | PRN
  Administered 2015-12-13: 200 mg via INTRAVENOUS

## 2015-12-13 MED ORDER — CEFAZOLIN SODIUM-DEXTROSE 2-3 GM-% IV SOLR
2.0000 g | INTRAVENOUS | Status: AC
  Administered 2015-12-13: 2 g via INTRAVENOUS

## 2015-12-13 MED ORDER — ONDANSETRON HCL 4 MG/2ML IJ SOLN
4.0000 mg | Freq: Once | INTRAMUSCULAR | Status: AC | PRN
  Administered 2015-12-13: 4 mg via INTRAVENOUS

## 2015-12-13 MED ORDER — FENTANYL CITRATE (PF) 100 MCG/2ML IJ SOLN
25.0000 ug | INTRAMUSCULAR | Status: DC | PRN
  Administered 2015-12-13 (×3): 50 ug via INTRAVENOUS

## 2015-12-13 MED ORDER — MIDAZOLAM HCL 2 MG/2ML IJ SOLN
INTRAMUSCULAR | Status: AC
  Filled 2015-12-13: qty 2

## 2015-12-13 MED ORDER — ALPRAZOLAM 0.5 MG PO TABS
0.5000 mg | ORAL_TABLET | Freq: Two times a day (BID) | ORAL | Status: DC | PRN
  Administered 2015-12-14: 0.5 mg via ORAL
  Filled 2015-12-13: qty 1

## 2015-12-13 MED ORDER — ONDANSETRON HCL 4 MG/2ML IJ SOLN
INTRAMUSCULAR | Status: DC | PRN
  Administered 2015-12-13: 4 mg via INTRAVENOUS

## 2015-12-13 MED ORDER — METOCLOPRAMIDE HCL 5 MG/ML IJ SOLN
10.0000 mg | Freq: Once | INTRAMUSCULAR | Status: AC
  Administered 2015-12-13: 10 mg via INTRAVENOUS

## 2015-12-13 MED ORDER — CHLORHEXIDINE GLUCONATE 4 % EX LIQD: 1.0000 "application " | Freq: Once | CUTANEOUS | Status: DC

## 2015-12-13 MED ORDER — PROPOFOL 10 MG/ML IV BOLUS
INTRAVENOUS | Status: AC
  Filled 2015-12-13: qty 20

## 2015-12-13 MED ORDER — CEFAZOLIN SODIUM-DEXTROSE 2-3 GM-% IV SOLR
INTRAVENOUS | Status: AC
  Filled 2015-12-13: qty 50

## 2015-12-13 MED ORDER — ROCURONIUM BROMIDE 100 MG/10ML IV SOLN
INTRAVENOUS | Status: AC
  Filled 2015-12-13: qty 1

## 2015-12-13 MED ORDER — ONDANSETRON 4 MG PO TBDP: 4.0000 mg | ORAL_TABLET | Freq: Four times a day (QID) | ORAL | Status: DC | PRN

## 2015-12-13 MED ORDER — LACTATED RINGERS IV SOLN
INTRAVENOUS | Status: DC | PRN
  Administered 2015-12-13: 07:00:00 via INTRAVENOUS

## 2015-12-13 MED ORDER — ROCURONIUM BROMIDE 100 MG/10ML IV SOLN
INTRAVENOUS | Status: DC | PRN
  Administered 2015-12-13: 30 mg via INTRAVENOUS

## 2015-12-13 MED ORDER — FENTANYL CITRATE (PF) 250 MCG/5ML IJ SOLN
INTRAMUSCULAR | Status: AC
  Filled 2015-12-13: qty 5

## 2015-12-13 MED ORDER — BUPIVACAINE-EPINEPHRINE (PF) 0.25% -1:200000 IJ SOLN
INTRAMUSCULAR | Status: DC | PRN
  Administered 2015-12-13: 6 mL

## 2015-12-13 MED ORDER — INSULIN ASPART 100 UNIT/ML ~~LOC~~ SOLN
0.0000 [IU] | Freq: Three times a day (TID) | SUBCUTANEOUS | Status: DC
  Administered 2015-12-14 – 2015-12-15 (×4): 2 [IU] via SUBCUTANEOUS
  Administered 2015-12-15 – 2015-12-16 (×4): 3 [IU] via SUBCUTANEOUS

## 2015-12-13 MED ORDER — PRAVASTATIN SODIUM 20 MG PO TABS
20.0000 mg | ORAL_TABLET | Freq: Every day | ORAL | Status: DC
  Administered 2015-12-13 – 2015-12-15 (×3): 20 mg via ORAL
  Filled 2015-12-13 (×4): qty 1

## 2015-12-13 MED ORDER — SUCCINYLCHOLINE CHLORIDE 20 MG/ML IJ SOLN
INTRAMUSCULAR | Status: DC | PRN
  Administered 2015-12-13: 100 mg via INTRAVENOUS

## 2015-12-13 MED ORDER — DEXTROSE-NACL 5-0.9 % IV SOLN
INTRAVENOUS | Status: DC
  Administered 2015-12-13: 14:00:00 via INTRAVENOUS

## 2015-12-13 MED ORDER — ONDANSETRON HCL 4 MG/2ML IJ SOLN
4.0000 mg | Freq: Four times a day (QID) | INTRAMUSCULAR | Status: DC | PRN
  Administered 2015-12-13: 4 mg via INTRAVENOUS
  Filled 2015-12-13: qty 2

## 2015-12-13 MED ORDER — FENTANYL CITRATE (PF) 100 MCG/2ML IJ SOLN
INTRAMUSCULAR | Status: DC | PRN
  Administered 2015-12-13 (×4): 50 ug via INTRAVENOUS

## 2015-12-13 MED ORDER — METOCLOPRAMIDE HCL 5 MG/ML IJ SOLN
INTRAMUSCULAR | Status: AC
  Filled 2015-12-13: qty 2

## 2015-12-13 MED ORDER — ZOLPIDEM TARTRATE 5 MG PO TABS
5.0000 mg | ORAL_TABLET | Freq: Every evening | ORAL | Status: DC | PRN
  Administered 2015-12-13 – 2015-12-15 (×3): 5 mg via ORAL
  Filled 2015-12-13 (×3): qty 1

## 2015-12-13 MED ORDER — LIDOCAINE HCL (CARDIAC) 20 MG/ML IV SOLN
INTRAVENOUS | Status: DC | PRN
  Administered 2015-12-13: 100 mg via INTRAVENOUS

## 2015-12-13 MED ORDER — HYDROMORPHONE HCL 1 MG/ML IJ SOLN
1.0000 mg | INTRAMUSCULAR | Status: DC | PRN
  Administered 2015-12-13 – 2015-12-14 (×5): 1 mg via INTRAVENOUS
  Filled 2015-12-13 (×5): qty 1

## 2015-12-13 MED ORDER — INSULIN ASPART 100 UNIT/ML ~~LOC~~ SOLN: 0.0000 [IU] | Freq: Three times a day (TID) | SUBCUTANEOUS | Status: DC

## 2015-12-13 MED ORDER — FENTANYL CITRATE (PF) 100 MCG/2ML IJ SOLN
INTRAMUSCULAR | Status: AC
  Filled 2015-12-13: qty 2

## 2015-12-13 MED ORDER — MIDAZOLAM HCL 5 MG/5ML IJ SOLN
INTRAMUSCULAR | Status: DC | PRN
  Administered 2015-12-13 (×2): 1 mg via INTRAVENOUS

## 2015-12-13 MED ORDER — BUPIVACAINE-EPINEPHRINE (PF) 0.25% -1:200000 IJ SOLN
INTRAMUSCULAR | Status: AC
  Filled 2015-12-13: qty 30

## 2015-12-13 MED ORDER — OXYCODONE HCL 5 MG PO TABS: 5.0000 mg | ORAL_TABLET | ORAL | Status: DC | PRN

## 2015-12-13 MED ORDER — PRAVASTATIN SODIUM 20 MG PO TABS
20.0000 mg | ORAL_TABLET | Freq: Every day | ORAL | Status: DC
  Filled 2015-12-13: qty 1

## 2015-12-13 MED ORDER — OXYCODONE HCL 5 MG PO TABS
5.0000 mg | ORAL_TABLET | ORAL | Status: DC | PRN
  Administered 2015-12-14 – 2015-12-15 (×6): 5 mg via ORAL
  Filled 2015-12-13 (×7): qty 1

## 2015-12-13 MED ORDER — PROPOFOL 10 MG/ML IV BOLUS
INTRAVENOUS | Status: DC | PRN
  Administered 2015-12-13: 180 mg via INTRAVENOUS

## 2015-12-13 MED ORDER — NICOTINE 21 MG/24HR TD PT24
21.0000 mg | MEDICATED_PATCH | Freq: Every day | TRANSDERMAL | Status: DC
  Administered 2015-12-13 – 2015-12-16 (×4): 21 mg via TRANSDERMAL
  Filled 2015-12-13 (×4): qty 1

## 2015-12-13 MED ORDER — BUDESONIDE-FORMOTEROL FUMARATE 160-4.5 MCG/ACT IN AERO
2.0000 | INHALATION_SPRAY | Freq: Two times a day (BID) | RESPIRATORY_TRACT | Status: DC
  Administered 2015-12-13 – 2015-12-14 (×2): 2 via RESPIRATORY_TRACT
  Filled 2015-12-13: qty 6

## 2015-12-13 MED ORDER — LACTATED RINGERS IR SOLN
Status: DC | PRN
  Administered 2015-12-13: 1

## 2015-12-13 MED ORDER — SUGAMMADEX SODIUM 200 MG/2ML IV SOLN
INTRAVENOUS | Status: AC
  Filled 2015-12-13: qty 2

## 2015-12-13 MED ORDER — LIDOCAINE HCL (CARDIAC) 20 MG/ML IV SOLN
INTRAVENOUS | Status: AC
  Filled 2015-12-13: qty 5

## 2015-12-13 MED ORDER — KETOROLAC TROMETHAMINE 30 MG/ML IJ SOLN
INTRAMUSCULAR | Status: AC
  Administered 2015-12-13: 30 mg
  Filled 2015-12-13: qty 1

## 2015-12-13 MED ORDER — ATROPINE SULFATE 0.4 MG/ML IJ SOLN
INTRAMUSCULAR | Status: AC
  Filled 2015-12-13: qty 2

## 2015-12-13 MED ORDER — CETYLPYRIDINIUM CHLORIDE 0.05 % MT LIQD
7.0000 mL | Freq: Two times a day (BID) | OROMUCOSAL | Status: DC
  Administered 2015-12-14: 7 mL via OROMUCOSAL

## 2015-12-13 MED ORDER — IPRATROPIUM-ALBUTEROL 0.5-2.5 (3) MG/3ML IN SOLN
3.0000 mL | Freq: Four times a day (QID) | RESPIRATORY_TRACT | Status: DC | PRN
  Administered 2015-12-13 – 2015-12-14 (×2): 3 mL via RESPIRATORY_TRACT
  Filled 2015-12-13 (×2): qty 3

## 2015-12-13 MED ORDER — KETOROLAC TROMETHAMINE 30 MG/ML IJ SOLN: 30.0000 mg | Freq: Once | INTRAMUSCULAR | Status: DC

## 2015-12-13 MED ORDER — SODIUM CHLORIDE 0.9 % IJ SOLN
INTRAMUSCULAR | Status: AC
  Filled 2015-12-13: qty 10

## 2015-12-13 SURGICAL SUPPLY — 36 items
APPLIER CLIP 5 13 M/L LIGAMAX5 (MISCELLANEOUS)
BENZOIN TINCTURE PRP APPL 2/3 (GAUZE/BANDAGES/DRESSINGS) IMPLANT
CABLE HIGH FREQUENCY MONO STRZ (ELECTRODE) ×3 IMPLANT
CHLORAPREP W/TINT 26ML (MISCELLANEOUS) ×3 IMPLANT
CLIP APPLIE 5 13 M/L LIGAMAX5 (MISCELLANEOUS) IMPLANT
CLIP LIGATING HEMO O LOK GREEN (MISCELLANEOUS) ×6 IMPLANT
CLOSURE WOUND 1/2 X4 (GAUZE/BANDAGES/DRESSINGS) ×1
COVER MAYO STAND STRL (DRAPES) IMPLANT
COVER SURGICAL LIGHT HANDLE (MISCELLANEOUS) ×3 IMPLANT
COVER TRANSDUCER ULTRASND (DRAPES) ×3 IMPLANT
DECANTER SPIKE VIAL GLASS SM (MISCELLANEOUS) ×3 IMPLANT
DEVICE TROCAR PUNCTURE CLOSURE (ENDOMECHANICALS) ×3 IMPLANT
DRAPE C-ARM 42X120 X-RAY (DRAPES) IMPLANT
DRAPE LAPAROSCOPIC ABDOMINAL (DRAPES) ×3 IMPLANT
DRAPE UTILITY XL STRL (DRAPES) ×3 IMPLANT
ELECT REM PT RETURN 9FT ADLT (ELECTROSURGICAL) ×3
ELECTRODE REM PT RTRN 9FT ADLT (ELECTROSURGICAL) ×1 IMPLANT
GAUZE SPONGE 2X2 8PLY STRL LF (GAUZE/BANDAGES/DRESSINGS) ×1 IMPLANT
GAUZE SPONGE 4X4 12PLY STRL (GAUZE/BANDAGES/DRESSINGS) ×3 IMPLANT
GLOVE BIO SURGEON STRL SZ7.5 (GLOVE) ×3 IMPLANT
GOWN STRL REUS W/TWL XL LVL3 (GOWN DISPOSABLE) ×6 IMPLANT
HEMOSTAT SURGICEL 4X8 (HEMOSTASIS) IMPLANT
KIT BASIN OR (CUSTOM PROCEDURE TRAY) ×3 IMPLANT
NEEDLE INSUFFLATION 14GA 120MM (NEEDLE) ×3 IMPLANT
SCISSORS LAP 5X35 DISP (ENDOMECHANICALS) ×3 IMPLANT
SET CHOLANGIOGRAPH MIX (MISCELLANEOUS) IMPLANT
SET IRRIG TUBING LAPAROSCOPIC (IRRIGATION / IRRIGATOR) ×3 IMPLANT
SPONGE GAUZE 2X2 STER 10/PKG (GAUZE/BANDAGES/DRESSINGS) ×2
STRIP CLOSURE SKIN 1/2X4 (GAUZE/BANDAGES/DRESSINGS) ×2 IMPLANT
SUT MNCRL AB 4-0 PS2 18 (SUTURE) ×3 IMPLANT
TOWEL OR 17X26 10 PK STRL BLUE (TOWEL DISPOSABLE) ×3 IMPLANT
TOWEL OR NON WOVEN STRL DISP B (DISPOSABLE) ×3 IMPLANT
TRAY LAPAROSCOPIC (CUSTOM PROCEDURE TRAY) ×3 IMPLANT
TROCAR BLADELESS OPT 5 75 (ENDOMECHANICALS) ×3 IMPLANT
TROCAR SLEEVE XCEL 5X75 (ENDOMECHANICALS) ×3 IMPLANT
TROCAR XCEL NON-BLD 11X100MML (ENDOMECHANICALS) ×3 IMPLANT

## 2015-12-13 NOTE — Discharge Instructions (Signed)
[facility] ______[facility] °LAPAROSCOPIC SURGERY: POST OP INSTRUCTIONS °Always review your discharge instruction sheet given to you by the facility where your surgery was performed. °IF YOU HAVE DISABILITY OR FAMILY LEAVE FORMS, YOU MUST BRING THEM TO THE OFFICE FOR PROCESSING.   °DO NOT GIVE THEM TO YOUR DOCTOR. ° °1. A prescription for pain medication may be given to you upon discharge.  Take your pain medication as prescribed, if needed.  If narcotic pain medicine is not needed, then you may take acetaminophen (Tylenol) or ibuprofen (Advil) as needed. °2. Take your usually prescribed medications unless otherwise directed. °3. If you need a refill on your pain medication, please contact your pharmacy.  They will contact our office to request authorization. Prescriptions will not be filled after 5pm or on week-ends. °4. You should follow a light diet the first few days after arrival home, such as soup and crackers, etc.  Be sure to include lots of fluids daily. °5. Most patients will experience some swelling and bruising in the area of the incisions.  Ice packs will help.  Swelling and bruising can take several days to resolve.  °6. It is common to experience some constipation if taking pain medication after surgery.  Increasing fluid intake and taking a stool softener (such as Colace) will usually help or prevent this problem from occurring.  A mild laxative (Milk of Magnesia or Miralax) should be taken according to package instructions if there are no bowel movements after 48 hours. °7. Unless discharge instructions indicate otherwise, you may remove your bandages 24-48 hours after surgery, and you may shower at that time.  You may have steri-strips (small skin tapes) in place directly over the incision.  These strips should be left on the skin for 7-10 days.  If your surgeon used skin glue on the incision, you may shower in 24 hours.  The glue will flake off over the next 2-3 weeks.  Any sutures or  staples will be removed at the office during your follow-up visit. °8. ACTIVITIES:  You may resume regular (light) daily activities beginning the next day--such as daily self-care, walking, climbing stairs--gradually increasing activities as tolerated.  You may have sexual intercourse when it is comfortable.  Refrain from any heavy lifting or straining until approved by your doctor. °a. You may drive when you are no longer taking prescription pain medication, you can comfortably wear a seatbelt, and you can safely maneuver your car and apply brakes. °b. RETURN TO WORK:  __________________________________________________________ °9. You should see your doctor in the office for a follow-up appointment approximately 2-3 weeks after your surgery.  Make sure that you call for this appointment within a day or two after you arrive home to insure a convenient appointment time. °10. OTHER INSTRUCTIONS: __________________________________________________________________________________________________________________________ __________________________________________________________________________________________________________________________ °WHEN TO CALL YOUR DOCTOR: °1. Fever over 101.0 °2. Inability to urinate °3. Continued bleeding from incision. °4. Increased pain, redness, or drainage from the incision. °5. Increasing abdominal pain ° °The clinic staff is available to answer your questions during regular business hours.  Please don’t hesitate to call and ask to speak to one of the nurses for clinical concerns.  If you have a medical emergency, go to the nearest emergency room or call 911.  A surgeon from [facility] is always on call at the hospital. °[address], [address], [place], [place]  [address] ? [address] [address], [place], [place]   [address] °[phone] ? [phone] ? FAX [phone] °Web site:   [email or web address] °

## 2015-12-13 NOTE — Anesthesia Procedure Notes (Signed)
Procedure Name: Intubation Date/Time: 12/13/2015 7:35 AM Performed by: Carleene Cooper A Pre-anesthesia Checklist: Patient identified, Timeout performed, Emergency Drugs available, Suction available and Patient being monitored Patient Re-evaluated:Patient Re-evaluated prior to inductionOxygen Delivery Method: Circle system utilized Preoxygenation: Pre-oxygenation with 100% oxygen Intubation Type: IV induction Ventilation: Mask ventilation without difficulty and Oral airway inserted - appropriate to patient size Laryngoscope Size: Mac and 4 Grade View: Grade I Tube type: Oral Tube size: 7.5 mm Number of attempts: 1 Airway Equipment and Method: Stylet Placement Confirmation: breath sounds checked- equal and bilateral,  ETT inserted through vocal cords under direct vision and positive ETCO2 Secured at: 22 cm Tube secured with: Tape Dental Injury: Teeth and Oropharynx as per pre-operative assessment

## 2015-12-13 NOTE — Progress Notes (Signed)
Patient alert oriented, no shortness of breath or wheezing noted. Patient oxygen saturation at 89% increased to 5L. Oxygen saturation elevated to 95% and maintained. Will continue to monitor patient.

## 2015-12-13 NOTE — Anesthesia Preprocedure Evaluation (Addendum)
Anesthesia Evaluation  Patient identified by MRN, date of birth, ID band Patient awake    Reviewed: Allergy & Precautions, NPO status , Patient's Chart, lab work & pertinent test results  History of Anesthesia Complications Negative for: history of anesthetic complications  Airway Mallampati: III  TM Distance: >3 FB Neck ROM: Full  Mouth opening: Limited Mouth Opening  Dental no notable dental hx. (+) Dental Advisory Given, Edentulous Upper, Edentulous Lower   Pulmonary sleep apnea , COPD, former smoker,    Pulmonary exam normal breath sounds clear to auscultation       Cardiovascular hypertension, Pt. on medications Normal cardiovascular exam Rhythm:Regular Rate:Normal     Neuro/Psych  Headaches, PSYCHIATRIC DISORDERS Anxiety    GI/Hepatic Neg liver ROS, GERD  Medicated and Controlled,  Endo/Other  diabetesMorbid obesity  Renal/GU negative Renal ROS  negative genitourinary   Musculoskeletal  (+) Arthritis ,   Abdominal (+) + obese,   Peds negative pediatric ROS (+)  Hematology  (+) anemia ,   Anesthesia Other Findings   Reproductive/Obstetrics negative OB ROS                           Anesthesia Physical Anesthesia Plan  ASA: III  Anesthesia Plan: General   Post-op Pain Management:    Induction: Intravenous  Airway Management Planned: Oral ETT  Additional Equipment:   Intra-op Plan:   Post-operative Plan: Extubation in OR and Possible Post-op intubation/ventilation  Informed Consent: I have reviewed the patients History and Physical, chart, labs and discussed the procedure including the risks, benefits and alternatives for the proposed anesthesia with the patient or authorized representative who has indicated his/her understanding and acceptance.   Dental advisory given  Plan Discussed with: CRNA  Anesthesia Plan Comments:        Anesthesia Quick Evaluation

## 2015-12-13 NOTE — Transfer of Care (Signed)
Immediate Anesthesia Transfer of Care Note  Patient: Stephanie Schultz  Procedure(s) Performed: Procedure(s): LAPAROSCOPIC CHOLECYSTECTOMY (N/A)  Patient Location: PACU  Anesthesia Type:General  Level of Consciousness: awake, alert , oriented and patient cooperative  Airway & Oxygen Therapy: Patient Spontanous Breathing and Patient connected to face mask oxygen  Post-op Assessment: Report given to RN, Post -op Vital signs reviewed and stable and Patient moving all extremities  Post vital signs: Reviewed and stable. Last Vitals:  Filed Vitals:   12/13/15 0530  BP: 132/71  Pulse: 94  Temp: 36.7 C  Resp: 16    Complications: No apparent anesthesia complications

## 2015-12-13 NOTE — Progress Notes (Signed)
Patient alert and oriented, no respiratory distress noted. Oxygen decreased to 4.5L. Will continue to monitor patient and decrease oxygen levels to baseline of 2L (patient usage at home). MD aware and plan communicated.

## 2015-12-13 NOTE — Op Note (Signed)
12/13/2015  8:17 AM  PATIENT:  Stephanie Schultz  60 y.o. female  PRE-OPERATIVE DIAGNOSIS:  SYMPTOMATIC GALLSTONES  POST-OPERATIVE DIAGNOSIS:  SYMPTOMATIC GALLSTONES  PROCEDURE:  Procedure(s): LAPAROSCOPIC CHOLECYSTECTOMY (N/A)  SURGEON:  Surgeon(s) and Role:    * Ralene Ok, MD - Primary  ANESTHESIA:   local and general  EBL:   <5CC  BLOOD ADMINISTERED:none  DRAINS: none   LOCAL MEDICATIONS USED:  BUPIVICAINE   SPECIMEN:  Source of Specimen:  GALLBALDDERPATHOLOGY DISPOSITION OF SPECIMEN:  PATHOLOGY  COUNTS:  YES  TOURNIQUET:  * No tourniquets in log *  DICTATION: .Dragon Dictation The patient was taken to the operating and placed in the supine position with bilateral SCDs in place. The patient was prepped and draped in the usual sterile fashion. A time out was called and all facts were verified. A pneumoperitoneum was obtained via A Veress needle technique to a pressure of 57mm of mercury.  A 31mm trochar was then placed in the right upper quadrant under visualization, and there were no injuries to any abdominal organs. A 11 mm port was then placed in the umbilical region after infiltrating with local anesthesia under direct visualization. A second and third epigastric port and right lower quadrant port placement under direct visualization, respectively. The liver was very fatty and large. The gallbladder was identified and retracted, the peritoneum was then sharply dissected from the gallbladder and this dissection was carried down to Calot's triangle. The gallbladder was identified and stripped away circumferentially and seen going into the gallbladder 360, the critical angle was obtained.  2 clips were placed proximally one distally and the cystic duct transected. The cystic artery was identified and 2 clips placed proximally and one distally and transected. We then proceeded to remove the gallbladder off the hepatic fossa with Bovie cautery. A retrieval bag was then placed  in the abdomen and gallbladder placed in the bag. The hepatic fossa was then reexamined and hemostasis was achieved with Bovie cautery and was excellent at the end of the case. The subhepatic fossa and perihepatic fossa was then irrigated until the effluent was clear. The gallbladder and bag were removed from the abdominal cavity. The 11 mm trocar fascia was reapproximated with the Endo Close #1 Vicryl. The pneumoperitoneum was evacuated and all trochars removed under direct visulalization. The skin was then closed with 4-0 Monocryl and the skin dressed with Steri-Strips, gauze, and tape. The patient was awaken from general anesthesia and taken to the recovery room in stable condition.   PLAN OF CARE: Discharge to home after PACU  PATIENT DISPOSITION:  PACU - hemodynamically stable.   Delay start of Pharmacological VTE agent (>24hrs) due to surgical blood loss or risk of bleeding: not applicable

## 2015-12-13 NOTE — H&P (Signed)
The patient is a 60 year old female who presents for evaluation of gall stones. Brekyn Ogle is a 60 y.o. female. The patient is a 60 year old female who comes in today for further checkup.. 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 The patient recently underwent ultrasound which reveals multiple gallstones within the gallbladder. Patient comes in today with chief complaint of continued epigastric pain. She states that the pain is mainly after eating high fatty foods. She states that she continues with epigastric right upper quadrant pain. She does state that she started changing her diet however does occasionally have fried foods, high fatty foods.   Other Problems Elbert Ewings, CMA; 10/20/2015 8:30 AM) Anxiety Disorder Back Pain Cervical Cancer Chronic Obstructive Lung Disease Diabetes Mellitus Emphysema Of Lung High blood pressure Home Oxygen Use Sleep Apnea  Past Surgical History Elbert Ewings, CMA; 10/20/2015 8:30 AM) Breast Biopsy Right.  Diagnostic Studies History Elbert Ewings, [place]; 10/20/2015 8:30 AM) Colonoscopy within last year Mammogram >3 years ago Pap Smear 1-5 years ago  Allergies Elbert Ewings, CMA; 10/20/2015 8:30 AM) Acetaminophen Fruit *ANALGESICS - NonNarcotic*  Medication History Elbert Ewings, CMA; 10/20/2015 8:35 AM) Albuterol Sulfate ((2.5 MG/3ML)0.083% Nebulized Soln, Inhalation) Active. Zyloprim (100MG  Tablet, Oral) Active. Xanax (0.5MG  Tablet, Oral) Active. Symbicort (160-4.5MCG/ACT Aerosol, Inhalation) Active. Vitamin E (1000UNIT Capsule, Oral) Active. Flonase (50MCG/ACT Suspension, Nasal) Active. Glucotrol (10MG  Tablet, Oral) Active. Hydrocodone-Acetaminophen (7.5-500MG  Tablet, Oral) Active. Spiriva HandiHaler (18MCG Capsule, Inhalation) Active. Ambien (10MG  Tablet, Oral) Active. Dyazide (37.5-25MG  Capsule, Oral) Active. Linzess (145MCG  Capsule, Oral) Active. Atrovent (0.02% Solution, Inhalation) Active. Anaspaz (0.125MG  Tablet, Oral) Active. GlipiZIDE (10MG  Tablet, Oral) Active. Medications Reconciled  Social History Elbert Ewings, CMA; 10/20/2015 8:30 AM) Caffeine use Coffee. No alcohol use No drug use Tobacco use Current every day smoker.  Family History Elbert Ewings, [place]; 10/20/2015 8:30 AM) Alcohol Abuse Family Members In General. Hypertension Mother. Thyroid problems Daughter.  Pregnancy / Birth History Elbert Ewings, CMA; 10/20/2015 8:30 AM) Age at menarche 21 years. Age of menopause <47 Contraceptive History Oral contraceptives. Gravida 4 Irregular periods Maternal age 57-20 Para 3    Review of Systems Ralene Ok MD; 10/20/2015 8:59 AM) General Present- Night Sweats and Weight Gain. Not Present- Appetite Loss, Chills, Fatigue, Fever and Weight Loss. Skin Not Present- Change in Wart/Mole, Dryness, Hives, Jaundice, New Lesions, Non-Healing Wounds, Rash and Ulcer. HEENT Present- Ringing in the Ears and Visual Disturbances. Not Present- Earache, Hearing Loss, Hoarseness, Nose Bleed, Oral Ulcers, Seasonal Allergies, Sinus Pain, Sore Throat, Wears glasses/contact lenses and Yellow Eyes. Respiratory Present- Wheezing. Not Present- Bloody sputum, Chronic Cough, Difficulty Breathing and Snoring. Cardiovascular Present- Difficulty Breathing Lying Down, Shortness of Breath and Swelling of Extremities. Not Present- Chest Pain, Leg Cramps, Palpitations and Rapid Heart Rate. Gastrointestinal Present- Abdominal Pain and Nausea. Musculoskeletal Not Present- Myalgia. Neurological Present- Headaches and Numbness. Not Present- Decreased Memory, Fainting, Seizures, Tingling, Tremor, Trouble walking and Weakness.  BP 132/71 mmHg  Pulse 94  Temp(Src) 98 F (36.7 C) (Oral)  Resp 16  Ht 5' (1.524 m)  Wt 101.152 kg (223 lb)  BMI 43.55 kg/m2  SpO2 94%  LMP 06/04/2000 (LMP Unknown)   Physical Exam  Ralene Ok, MD; 10/20/2015 9:2 AM) General Mental Status-Alert. General Appearance-Consistent with stated age. Hydration-Well hydrated. Voice-Normal.  Head and Neck Head-normocephalic, atraumatic with no lesions or palpable masses.  Eye Eyeball - Bilateral-Extraocular movements intact. Sclera/Conjunctiva - Bilateral-No scleral icterus.  Chest and Lung Exam  Chest and lung exam reveals -quiet, even and easy respiratory effort with no use of accessory muscles. Inspection Chest Wall - Normal. Back - normal.  Cardiovascular Cardiovascular examination reveals -normal heart sounds, regular rate and rhythm with no murmurs.  Abdomen Inspection Normal Exam - No Hernias. Palpation/Percussion Normal exam - Soft, Non Tender, No Rebound tenderness, No Rigidity (guarding) and No hepatosplenomegaly. Auscultation Normal exam - Bowel sounds normal.  Neurologic Neurologic evaluation reveals -alert and oriented x 3 with no impairment of recent or remote memory. Mental Status-Normal.  Musculoskeletal Normal Exam - Left-Upper Extremity Strength Normal and Lower Extremity Strength Normal. Normal Exam - Right-Upper Extremity Strength Normal, Lower Extremity Weakness.    Assessment & Plan Ralene Ok MD; 10/20/2015 9:02 AM) SYMPTOMATIC CHOLELITHIASIS (K80.20) Impression: 60 year old female with a complicated past medical history currently on home oxygen, with symptomatic gallstones.  1. The patient will like to proceed to the operating room for a laparoscopic cholecystectomy. 2. Risks and benefits were discussed with the patient to generally include, but not limited to: infection, bleeding, possible need for post op ERCP, damage to the bile ducts, bile leak, and possible need for further surgery. Also discussed with her the high likelihood of possible postoperative intubation. Also discussed the possibility of need for trach postoperatively, possible  rehabilitation. Alternatives were offered and described. All questions were answered and the patient voiced understanding of the procedure and wishes to proceed at this point with a laparoscopic cholecystectomy.

## 2015-12-13 NOTE — Progress Notes (Signed)
Patient states that she will call when ready for CPAP to be placed on. RT will continue to monitor.

## 2015-12-13 NOTE — Progress Notes (Signed)
Patient alert and oriented with oxygen saturation at 85% on 2L oxygen. Increased oxygen levels until 4.5L oxygen at 90. MD notified of patient condition and duo neb order 72mL q6h PRN. RT called to administer PRN dose.

## 2015-12-13 NOTE — Progress Notes (Signed)
Attempted CPAP on patient but she states that her head hurts too much to wear after wearing CPAP for a few minutes. RT will continue to monitor as needed.

## 2015-12-13 NOTE — Anesthesia Postprocedure Evaluation (Signed)
Anesthesia Post Note  Patient: Stephanie Schultz  Procedure(s) Performed: Procedure(s) (LRB): LAPAROSCOPIC CHOLECYSTECTOMY (N/A)  Patient location during evaluation: PACU Anesthesia Type: General Level of consciousness: awake and alert Pain management: pain level controlled Vital Signs Assessment: post-procedure vital signs reviewed and stable Respiratory status: spontaneous breathing, nonlabored ventilation, respiratory function stable and patient connected to nasal cannula oxygen Cardiovascular status: blood pressure returned to baseline and stable Postop Assessment: no signs of nausea or vomiting Anesthetic complications: no    Last Vitals:  Filed Vitals:   12/13/15 1345 12/13/15 1444  BP: 143/59 130/68  Pulse: 92 91  Temp: 36.6 C 36.7 C  Resp: 18 20    Last Pain:  Filed Vitals:   12/13/15 1444  PainSc: 5                  Markeith Jue JENNETTE

## 2015-12-14 ENCOUNTER — Observation Stay (HOSPITAL_COMMUNITY): Payer: Medicaid Other

## 2015-12-14 DIAGNOSIS — J9621 Acute and chronic respiratory failure with hypoxia: Secondary | ICD-10-CM | POA: Diagnosis not present

## 2015-12-14 DIAGNOSIS — J9811 Atelectasis: Secondary | ICD-10-CM

## 2015-12-14 DIAGNOSIS — Z9049 Acquired absence of other specified parts of digestive tract: Secondary | ICD-10-CM | POA: Diagnosis not present

## 2015-12-14 DIAGNOSIS — J432 Centrilobular emphysema: Secondary | ICD-10-CM | POA: Diagnosis not present

## 2015-12-14 LAB — GLUCOSE, CAPILLARY
GLUCOSE-CAPILLARY: 136 mg/dL — AB (ref 65–99)
GLUCOSE-CAPILLARY: 149 mg/dL — AB (ref 65–99)
GLUCOSE-CAPILLARY: 123 mg/dL — AB (ref 65–99)
GLUCOSE-CAPILLARY: 159 mg/dL — AB (ref 65–99)
GLUCOSE-CAPILLARY: 108 mg/dL — AB (ref 65–99)

## 2015-12-14 MED ORDER — IPRATROPIUM-ALBUTEROL 0.5-2.5 (3) MG/3ML IN SOLN
3.0000 mL | Freq: Four times a day (QID) | RESPIRATORY_TRACT | Status: DC
  Administered 2015-12-14 (×2): 3 mL via RESPIRATORY_TRACT
  Filled 2015-12-14 (×2): qty 3

## 2015-12-14 MED ORDER — TIOTROPIUM BROMIDE MONOHYDRATE 18 MCG IN CAPS
18.0000 ug | ORAL_CAPSULE | Freq: Every day | RESPIRATORY_TRACT | Status: DC
  Administered 2015-12-14 – 2015-12-16 (×3): 18 ug via RESPIRATORY_TRACT
  Filled 2015-12-14: qty 5

## 2015-12-14 MED ORDER — BUDESONIDE 0.5 MG/2ML IN SUSP
0.5000 mg | Freq: Two times a day (BID) | RESPIRATORY_TRACT | Status: DC
  Administered 2015-12-14 – 2015-12-16 (×5): 0.5 mg via RESPIRATORY_TRACT
  Filled 2015-12-14 (×4): qty 2

## 2015-12-14 MED ORDER — FUROSEMIDE 10 MG/ML IJ SOLN
40.0000 mg | Freq: Four times a day (QID) | INTRAMUSCULAR | Status: AC
  Administered 2015-12-14 (×2): 40 mg via INTRAVENOUS
  Filled 2015-12-14 (×2): qty 4

## 2015-12-14 MED ORDER — ARFORMOTEROL TARTRATE 15 MCG/2ML IN NEBU
15.0000 ug | INHALATION_SOLUTION | Freq: Two times a day (BID) | RESPIRATORY_TRACT | Status: DC
  Administered 2015-12-14 – 2015-12-16 (×4): 15 ug via RESPIRATORY_TRACT
  Filled 2015-12-14 (×8): qty 2

## 2015-12-14 NOTE — Care Management Note (Signed)
Case Management Note  Patient Details  Name: Stephanie Schultz MRN: [id number] Date of Birth: 1956-08-12  Subjective/Objective:   Admitted s/p lap chole                   Action/Plan: Discharge planning, spoke with patient at bedside. Patient states she lives at home alone, manages all ADL's, uses [employer] for transportation needs. Has home O2 at 2l that is provided by [facility]. Has PCS 80h/month to assist with home care. Has friends who assist as well. Currently SOB, difficulty with weaning O2.   Expected Discharge Date:                  Expected Discharge Plan:  Home/Self Care  In-House Referral:  NA  Discharge planning Services  CM Consult  Post Acute Care Choice:  NA Choice offered to:  Patient  DME Arranged:  N/A DME Agency:  NA  HH Arranged:  NA HH Agency:  NA  Status of Service:  Completed, signed off  Medicare Important Message Given:    Date Medicare IM Given:    Medicare IM give by:    Date Additional Medicare IM Given:    Additional Medicare Important Message give by:     If discussed at [place] of Stay Meetings, dates discussed:    Additional Comments:  Guadalupe Maple, RN 12/14/2015, 3:22 PM

## 2015-12-14 NOTE — Progress Notes (Signed)
1 Day Post-Op  Subjective: Pt doing well POD 1.  Using IS.  Ambulating yesterday  Objective: Vital signs in last 24 hours: Temp:  [97.7 F (36.5 C)-98.6 F (37 C)] 98.6 F (37 C) (02/01 0532) Pulse Rate:  [89-106] 97 (02/01 0532) Resp:  [12-25] 23 (02/01 0532) BP: (111-195)/(47-96) 128/62 mmHg (02/01 0532) SpO2:  [85 %-100 %] 95 % (02/01 0532) Last BM Date: 12/12/15  Intake/Output from previous day: 01/31 0701 - 02/01 0700 In: 2150 [P.O.:120; I.V.:2030] Out: 300 [Urine:275; Blood:25] Intake/Output this shift:    General appearance: alert and cooperative Cardio: regular rate and rhythm, S1, S2 normal, no murmur, click, rub or gallop GI: soft, approp ttp, ND, incision c/d/i  Lab Results:  No results for input(s): WBC, HGB, HCT, PLT in the last 72 hours. BMET No results for input(s): NA, K, CL, CO2, GLUCOSE, BUN, CREATININE, CALCIUM in the last 72 hours. PT/INR No results for input(s): LABPROT, INR in the last 72 hours. ABG No results for input(s): PHART, HCO3 in the last 72 hours.  Invalid input(s): PCO2, PO2  Studies/Results: No results found.  Anti-infectives: Anti-infectives    Start     Dose/Rate Route Frequency Ordered Stop   12/13/15 0532  ceFAZolin (ANCEF) IVPB 2 g/50 mL premix     2 g 100 mL/hr over 30 Minutes Intravenous On call to O.R. 12/13/15 0532 12/13/15 0737      Assessment/Plan: s/p Procedure(s): LAPAROSCOPIC CHOLECYSTECTOMY (N/A) 1. Mobilize 2. Wean O2 as tol 3. Reg diet 4. Will have Pulm assess to see if anything to add/change to help decrease her 02 support 5. Home likely tomorrow if improved     Rosario Jacks., Anne Hahn 12/14/2015

## 2015-12-14 NOTE — Consult Note (Signed)
PULMONARY / CRITICAL CARE MEDICINE   Name: Stephanie Schultz MRN: [id number] DOB: 03-03-1956    ADMISSION DATE:  12/13/2015 CONSULTATION DATE: 12/14/2015  REFERRING MD: Justine Null  CHIEF COMPLAINT: hypoxia with increased supplemental oxygen requirements   HISTORY OF PRESENT ILLNESS:   60 year old female with sig medical history of COPD/emphysema - home oxygen use, OSA with CPAP use at night, anxiety, HTN, DM, and tobacco use. Presented 1/31 for evaluation of all stones in the setting of right upper epigastric pain. Ultrasound reveled multiple gallstones within the gallbladder. Patient proceeded to the OR for a laparoscopic cholecystectomy. After surgery it was noted that patient had an increase of supplemental oxygen requirements from 2L to 5L. PCCM to consult.   PAST MEDICAL HISTORY :  She  has a past medical history of Hypertension; Anxiety; OSA on CPAP; Dizziness; GERD (gastroesophageal reflux disease); Headache(784.0); Cancer ([place]) (1990); Anemia; Morbid obesity ([place]); Sickle cell trait ([place]); cardiac catheterization; Tobacco abuse; Shortness of breath; Pneumonia; Diabetes mellitus without complication ([place]); Emphysema; COPD (chronic obstructive pulmonary disease) ([place]); Hyperlipidemia; Supplemental oxygen dependent; Numbness; Arthritis; Gout; and Gallstones.  PAST SURGICAL HISTORY: She  has past surgical history that includes Tubal ligation; Cardiac catheterization; Colonoscopy (09/05/2012); Colonoscopy with propofol (N/A, 09/02/2015); and Cholecystectomy (N/A, 12/13/2015).  Allergies  Allergen Reactions  . Acetaminophen Other (See Comments)    Pt was told that she could not take Tylenol, does not know why    No current facility-administered medications on file prior to encounter.   Current Outpatient Prescriptions on File Prior to Encounter  Medication Sig  . albuterol (PROVENTIL) (2.5 MG/3ML) 0.083% nebulizer solution Take 3 mLs (2.5 mg total) by nebulization 4 (four) times daily. DX J44.9  (Patient taking differently: Take 2.5 mg by nebulization every 6 (six) hours as needed for wheezing or shortness of breath. DX J44.9)  . allopurinol (ZYLOPRIM) 100 MG tablet Take 100 mg by mouth daily.  Marland Kitchen ALPRAZolam (XANAX) 0.5 MG tablet Take 1 tablet (0.5 mg total) by mouth 2 (two) times daily as needed for anxiety.  . budesonide-formoterol (SYMBICORT) 160-4.5 MCG/ACT inhaler Inhale 2 puffs into the lungs 2 (two) times daily.  Marland Kitchen glipiZIDE (GLUCOTROL) 10 MG tablet Take 10 mg by mouth daily before breakfast.  . HYDROcodone-acetaminophen (NORCO) 7.5-325 MG per tablet Take 1 tablet by mouth every 4 (four) hours as needed for moderate pain or severe pain. (Patient taking differently: Take 1 tablet by mouth every 8 (eight) hours as needed for moderate pain or severe pain. )  . ibuprofen (ADVIL,MOTRIN) 800 MG tablet Take 800 mg by mouth every 8 (eight) hours as needed for moderate pain.   Marland Kitchen ipratropium (ATROVENT) 0.02 % nebulizer solution USE 1 VIAL IN NEBULIZER EVERY 6 HOURS AS NEEDED FOR WHEEZING OR SHORTNESS OF BREATH.  Marland Kitchen Linaclotide (LINZESS) 145 MCG CAPS capsule Take 145 mcg by mouth every morning.  . loratadine (CLARITIN) 10 MG tablet Take 10 mg by mouth daily.  Marland Kitchen NEXIUM 40 MG capsule TAKE ONE CAPSULE BY MOUTH DAILY AT 12 NOON  . pregabalin (LYRICA) 75 MG capsule Take 75 mg by mouth 2 (two) times daily.  Marland Kitchen PROAIR HFA 108 (90 BASE) MCG/ACT inhaler INHALE 2 PUFFS BY MOUTH EVERY 4 HOURS AS NEEDED FOR WHEEZING  . tiotropium (SPIRIVA) 18 MCG inhalation capsule Place 1 capsule (18 mcg total) into inhaler and inhale daily.  Marland Kitchen triamterene-hydrochlorothiazide (DYAZIDE) 37.5-25 MG per capsule Take 1 capsule by mouth daily.  Marland Kitchen zolpidem (AMBIEN) 10 MG tablet Take 10 mg by mouth at  bedtime.  . fluticasone (FLONASE) 50 MCG/ACT nasal spray INHALE 2 SPRAYS IN EACH NOSTRIL ONCE DAILY    FAMILY HISTORY:  Her indicated that her mother is alive. She indicated that her father is deceased.   SOCIAL HISTORY: She   reports that she quit smoking about 4 weeks ago. Her smoking use included Cigarettes. She has a 18 pack-year smoking history. She has never used smokeless tobacco. She reports that she does not drink alcohol or use illicit drugs.  REVIEW OF SYSTEMS:   Complains of increased dyspnea on exertion. 10/10 headache. Denies chest pain or dizziness.   SUBJECTIVE:  Alert and oriented female with increased shortness of breath. Patient was moving from [facility] to bed on 3L and destated to 80%, increased oxygen to 5L and patient recovered after about 10 mins.   VITAL SIGNS: BP 144/65 mmHg  Pulse 101  Temp(Src) 99.6 F (37.6 C) (Oral)  Resp 22  Ht 5' (1.524 m)  Wt 223 lb (101.152 kg)  BMI 43.55 kg/m2  SpO2 88%  LMP 06/04/2000 (LMP Unknown)  HEMODYNAMICS:    VENTILATOR SETTINGS:    INTAKE / OUTPUT: I/O last 3 completed shifts: In: 2150 [P.O.:120; I.V.:2030] Out: 300 [Urine:275; Blood:25]  PHYSICAL EXAMINATION: General: Adult female well hydrated, dyspnea on exertion Neuro:  Alert and oriented.  HEENT: normocephalic, EOM intact  Cardiovascular: RRR, no murmur, rub, or gallop Lungs:  Diminished clear breath sounds bilaterally, irregular, shallow breaths with use of accessory muscles.  Abdomen: Soft, non-distended. Active bowel sounds. Tender to touch r/t to surgery  Musculoskeletal: no deformities or deficits  Skin: Warm, Dry, Intact, incision to abdomen clean, dry and intact   LABS:  BMET  Recent Labs Lab 12/08/15 0830  NA 138  K 4.2  CL 101  CO2 26  BUN 17  CREATININE 1.03*  GLUCOSE 201*    Electrolytes  Recent Labs Lab 12/08/15 0830  CALCIUM 9.5    CBC  Recent Labs Lab 12/08/15 0830  WBC 6.0  HGB 11.5*  HCT 35.4*  PLT 246    Coag's No results for input(s): APTT, INR in the last 168 hours.  Sepsis Markers No results for input(s): LATICACIDVEN, PROCALCITON, O2SATVEN in the last 168 hours.  ABG No results for input(s): PHART, PCO2ART, PO2ART in the last  168 hours.  Liver Enzymes No results for input(s): AST, ALT, ALKPHOS, BILITOT, ALBUMIN in the last 168 hours.  Cardiac Enzymes No results for input(s): TROPONINI, PROBNP in the last 168 hours.  Glucose  Recent Labs Lab 12/13/15 0533 12/13/15 0845 12/13/15 1741 12/13/15 2153 12/14/15 0803  GLUCAP 140* 158* 134* 158* 136*    Imaging No results found.   STUDIES:  Chest X-ray 2/1 >>>  CULTURES:   ANTIBIOTICS: Ancef 2 g 1/31 >>>  SIGNIFICANT EVENTS: Lap Cholecystectomy 1/31     DISCUSSION: 60 year old female presented to hospital on 1/31 for surgical evaluation. A laparoscopic cholecystectomy was preformed later that day. After surgery patient was noted to have increased supplemental oxygenation requirements. At home patient wears a baseline of 2L. Since surgery need has increased to 5L. Discussed with patient about continuing to use incentive spirometry. A Chest X-ray will be ordered to rule out any acute events. Most likely a post-op complication from surgery with anesthesia and decreased mobility.    ASSESSMENT / PLAN:  Acute on Chronic Hypoxic Respiratory Failure with increased supplemental oxygen needs COPD/Emplysema -->no evidence of acute exacerbation  Sleep Apnea Home Oxygen and CPAP use S/p lap Chole w/  resultant post-op pain.  -> Most likely post-op atx superimposed on underlying COPD.  Plan Chest X-ray  OK to use her own home CPAP mask Change BD regimen to brovana and budesonide NEBS while in hospital; then back to symbicort at dc Resume spiriva now Add flutter Optimize pain rx (seems as though this is affecting pulmonary hygiene measures) Wean supplemental oxygen as tolerated to baseline of 2L    Erick Colace ACNP-BC [place] Pager # [phone] OR # [phone] if no answer   12/14/2015, 10:29 AM

## 2015-12-15 DIAGNOSIS — J439 Emphysema, unspecified: Secondary | ICD-10-CM | POA: Diagnosis not present

## 2015-12-15 DIAGNOSIS — K219 Gastro-esophageal reflux disease without esophagitis: Secondary | ICD-10-CM | POA: Diagnosis present

## 2015-12-15 DIAGNOSIS — Z79891 Long term (current) use of opiate analgesic: Secondary | ICD-10-CM | POA: Diagnosis not present

## 2015-12-15 DIAGNOSIS — K8020 Calculus of gallbladder without cholecystitis without obstruction: Secondary | ICD-10-CM | POA: Diagnosis present

## 2015-12-15 DIAGNOSIS — I509 Heart failure, unspecified: Secondary | ICD-10-CM | POA: Diagnosis not present

## 2015-12-15 DIAGNOSIS — R1013 Epigastric pain: Secondary | ICD-10-CM | POA: Diagnosis present

## 2015-12-15 DIAGNOSIS — I10 Essential (primary) hypertension: Secondary | ICD-10-CM | POA: Diagnosis present

## 2015-12-15 DIAGNOSIS — Z01812 Encounter for preprocedural laboratory examination: Secondary | ICD-10-CM | POA: Diagnosis not present

## 2015-12-15 DIAGNOSIS — E119 Type 2 diabetes mellitus without complications: Secondary | ICD-10-CM | POA: Diagnosis present

## 2015-12-15 DIAGNOSIS — G4733 Obstructive sleep apnea (adult) (pediatric): Secondary | ICD-10-CM

## 2015-12-15 DIAGNOSIS — F419 Anxiety disorder, unspecified: Secondary | ICD-10-CM | POA: Diagnosis present

## 2015-12-15 DIAGNOSIS — E662 Morbid (severe) obesity with alveolar hypoventilation: Secondary | ICD-10-CM | POA: Diagnosis present

## 2015-12-15 DIAGNOSIS — Z9049 Acquired absence of other specified parts of digestive tract: Secondary | ICD-10-CM | POA: Diagnosis not present

## 2015-12-15 DIAGNOSIS — D573 Sickle-cell trait: Secondary | ICD-10-CM | POA: Diagnosis present

## 2015-12-15 DIAGNOSIS — Z8249 Family history of ischemic heart disease and other diseases of the circulatory system: Secondary | ICD-10-CM | POA: Diagnosis not present

## 2015-12-15 DIAGNOSIS — Z79899 Other long term (current) drug therapy: Secondary | ICD-10-CM | POA: Diagnosis not present

## 2015-12-15 DIAGNOSIS — Z9981 Dependence on supplemental oxygen: Secondary | ICD-10-CM | POA: Diagnosis not present

## 2015-12-15 DIAGNOSIS — Z886 Allergy status to analgesic agent status: Secondary | ICD-10-CM | POA: Diagnosis not present

## 2015-12-15 DIAGNOSIS — J449 Chronic obstructive pulmonary disease, unspecified: Secondary | ICD-10-CM | POA: Diagnosis present

## 2015-12-15 DIAGNOSIS — Z6841 Body Mass Index (BMI) 40.0 and over, adult: Secondary | ICD-10-CM | POA: Diagnosis not present

## 2015-12-15 DIAGNOSIS — J9811 Atelectasis: Secondary | ICD-10-CM | POA: Diagnosis not present

## 2015-12-15 DIAGNOSIS — J9601 Acute respiratory failure with hypoxia: Secondary | ICD-10-CM | POA: Diagnosis not present

## 2015-12-15 DIAGNOSIS — R0600 Dyspnea, unspecified: Secondary | ICD-10-CM | POA: Diagnosis not present

## 2015-12-15 DIAGNOSIS — J811 Chronic pulmonary edema: Secondary | ICD-10-CM | POA: Diagnosis present

## 2015-12-15 DIAGNOSIS — J9621 Acute and chronic respiratory failure with hypoxia: Secondary | ICD-10-CM | POA: Diagnosis not present

## 2015-12-15 DIAGNOSIS — J9622 Acute and chronic respiratory failure with hypercapnia: Secondary | ICD-10-CM | POA: Diagnosis not present

## 2015-12-15 LAB — BASIC METABOLIC PANEL
ANION GAP: 12 (ref 5–15)
BUN: 17 mg/dL (ref 6–20)
CHLORIDE: 96 mmol/L — AB (ref 101–111)
CO2: 31 mmol/L (ref 22–32)
Calcium: 8.6 mg/dL — ABNORMAL LOW (ref 8.9–10.3)
Creatinine, Ser: 1.15 mg/dL — ABNORMAL HIGH (ref 0.44–1.00)
GFR, EST NON AFRICAN AMERICAN: 52 mL/min — AB (ref >60)
Glucose, Bld: 151 mg/dL — ABNORMAL HIGH (ref 65–99)
POTASSIUM: 3.5 mmol/L (ref 3.5–5.1)
SODIUM: 139 mmol/L (ref 135–145)

## 2015-12-15 LAB — GLUCOSE, CAPILLARY
GLUCOSE-CAPILLARY: 125 mg/dL — AB (ref 65–99)
GLUCOSE-CAPILLARY: 167 mg/dL — AB (ref 65–99)
GLUCOSE-CAPILLARY: 101 mg/dL — AB (ref 65–99)
Glucose-Capillary: 189 mg/dL — ABNORMAL HIGH (ref 65–99)

## 2015-12-15 MED ORDER — FUROSEMIDE 10 MG/ML IJ SOLN
40.0000 mg | Freq: Once | INTRAMUSCULAR | Status: AC
  Administered 2015-12-15: 40 mg via INTRAVENOUS
  Filled 2015-12-15: qty 4

## 2015-12-15 MED ORDER — POLYETHYLENE GLYCOL 3350 17 G PO PACK
17.0000 g | PACK | Freq: Every day | ORAL | Status: DC
  Administered 2015-12-15 – 2015-12-16 (×2): 17 g via ORAL
  Filled 2015-12-15 (×2): qty 1

## 2015-12-15 MED ORDER — AZITHROMYCIN 500 MG PO TABS
500.0000 mg | ORAL_TABLET | Freq: Every day | ORAL | Status: DC
  Administered 2015-12-15 – 2015-12-16 (×2): 500 mg via ORAL
  Filled 2015-12-15 (×3): qty 1

## 2015-12-15 MED ORDER — IBUPROFEN 800 MG PO TABS
800.0000 mg | ORAL_TABLET | Freq: Three times a day (TID) | ORAL | Status: DC | PRN
  Administered 2015-12-15 (×2): 800 mg via ORAL
  Filled 2015-12-15 (×2): qty 1

## 2015-12-15 MED ORDER — ENOXAPARIN SODIUM 40 MG/0.4ML ~~LOC~~ SOLN
40.0000 mg | SUBCUTANEOUS | Status: DC
  Administered 2015-12-15 – 2015-12-16 (×2): 40 mg via SUBCUTANEOUS
  Filled 2015-12-15 (×2): qty 0.4

## 2015-12-15 NOTE — Progress Notes (Signed)
Dr. Lake Bells aware via phone of results of desaturation screen. See new orders received.

## 2015-12-15 NOTE — Assessment & Plan Note (Signed)
Continue on Oxygen 2l/m  Follow up with [name] next month as planned  and As needed   Please contact office for sooner follow up if symptoms do not improve or worsen or seek emergency care

## 2015-12-15 NOTE — Consult Note (Signed)
[facility] PCCM  S: Feeling much better, diuresed well, some sinus congestion overnight, oxygenation improving  O:  Filed Vitals:   12/14/15 2200 12/14/15 2312 12/15/15 0536 12/15/15 0835  BP: 122/61  116/83   Pulse: 97 101 89   Temp: 98 F (36.7 C)  98.2 F (36.8 C)   TempSrc: Oral  Oral   Resp: 22 18 22    Height:      Weight:      SpO2: 94% 96% 94% 94%   3 L nasal cannula  General: Obese, no distress HEENT NCAT pupils equal round reactive to light Pulmonary slight crackle left base, no wheezing, good air movement bilaterally Cardiovascular regular rate and rhythm no murmurs gallops rubs GI: Bowel sounds positive nontender nondistended Extremities minimal edema ankles Neurologic awake alert, oriented 4, moves all 4 extremities  Chest x-ray images personally reviewed from 12/14/2015 showing interstitial edema bilaterally, no infiltrate   BMET    Component Value Date/Time   NA 139 12/15/2015 0433   K 3.5 12/15/2015 0433   CL 96* 12/15/2015 0433   CO2 31 12/15/2015 0433   GLUCOSE 151* 12/15/2015 0433   BUN 17 12/15/2015 0433   CREATININE 1.15* 12/15/2015 0433   CALCIUM 8.6* 12/15/2015 0433   GFRNONAA 52* 12/15/2015 0433   GFRAA >60 12/15/2015 0433     Impression: Acute on chronic hypoxemic respiratory failure secondary to pulmonary edema and atelectasis, improving. This is not terribly uncommon in the postoperative setting for morbidly obese person with chronic oxygen need secondary to COPD. She is asymptomatic and doing better with improved oxygen needs today.  Status post laproscopic cholecystectomy: Per surgery  Acute side use situs with sinus congestion and green mucus production  Obstructive sleep apnea   Plan: Lasix 1 more dose Ambulate on oxygen, titrate to O2 sats ration greater than 88% Z-Pak 5 days Continue bronchodilators If she's able to ambulate and maintain her O2 saturation greater than 88% on nasal cannula oxygen then she should be able to go  home today Continue CPAP at night  Follow-up with pulmonary and critical care medicine as previously scheduled with [name] (February 24).  Roselie Awkward, MD [place] PCCM Pager: [phone] Cell: [phone] After 3pm or if no response, call [phone]

## 2015-12-15 NOTE — Assessment & Plan Note (Signed)
Mucinex DM Twice daily  As needed  Cough/congestion  Saline nasal spray and gel As needed   Continue on Symbicort and Spiriva .  Continue on Oxygen 2l/m  Follow up with [name] next month as planned  and As needed   Please contact office for sooner follow up if symptoms do not improve or worsen or seek emergency care

## 2015-12-15 NOTE — Progress Notes (Signed)
2 Days Post-Op  Subjective: Pt with no acute changes overnight.  Diuresed yesterday  Objective: Vital signs in last 24 hours: Temp:  [98 F (36.7 C)-99.6 F (37.6 C)] 98.2 F (36.8 C) (02/02 0536) Pulse Rate:  [89-101] 89 (02/02 0536) Resp:  [18-22] 22 (02/02 0536) BP: (116-144)/(22-83) 116/83 mmHg (02/02 0536) SpO2:  [88 %-100 %] 94 % (02/02 0536) Last BM Date: 12/12/15  Intake/Output from previous day: 02/01 0701 - 02/02 0700 In: 721.2 [I.V.:721.2] Out: 2750 [Urine:2750] Intake/Output this shift:    General appearance: alert and cooperative Cardio: regular rate and rhythm, S1, S2 normal, no murmur, click, rub or gallop GI: soft, non-tender; bowel sounds normal; no masses,  no organomegaly and incisions c/d/i  Lab Results:  No results for input(s): WBC, HGB, HCT, PLT in the last 72 hours. BMET  Recent Labs  12/15/15 0433  NA 139  K 3.5  CL 96*  CO2 31  GLUCOSE 151*  BUN 17  CREATININE 1.15*  CALCIUM 8.6*     Studies/Results: Dg Chest Port 1 View  12/14/2015  CLINICAL DATA:  Status post cholecystectomy December 13, 2015 complaining of shortness of breath. EXAM: PORTABLE CHEST 1 VIEW COMPARISON:  December 03, 2015 FINDINGS: The heart size and mediastinal contours are stable. There is increased pulmonary interstitium bilaterally. There is no focal pneumonia or pleural effusion. Chronic changes of several lateral left ribs are identified unchanged. IMPRESSION: Mild interstitial pulmonary edema. Electronically Signed   By: Abelardo Diesel M.D.   On: 12/14/2015 14:20    Anti-infectives: Anti-infectives    Start     Dose/Rate Route Frequency Ordered Stop   12/13/15 0532  ceFAZolin (ANCEF) IVPB 2 g/50 mL premix     2 g 100 mL/hr over 30 Minutes Intravenous On call to O.R. 12/13/15 0532 12/13/15 0737      Assessment/Plan: s/p Procedure(s): LAPAROSCOPIC CHOLECYSTECTOMY (N/A) Appreciate Pulm assistance  Con't inpt care for O2 requirements Mobilize Pain control      Rosario Jacks., Anne Hahn 12/15/2015

## 2015-12-15 NOTE — Progress Notes (Signed)
[facility] Progress Note Patient Name: Stephanie Schultz DOB: 13-Nov-1955 MRN: [id number]   Date of Service  12/15/2015  HPI/Events of Note  [facility] PCCM  Ambulated on 4 L, O2 saturation dropped to 60-70'% per RN. Some dyspnea constipation    eICU Interventions  Plan  Extra lasix Order echo miralax      Intervention Category Intermediate Interventions: Respiratory distress - evaluation and management;Other:  Simonne Maffucci 12/15/2015, 3:58 PM

## 2015-12-15 NOTE — Progress Notes (Signed)
Pt walked approximately 140 ft with oxygen tank. Ambulated w/ O2 @6L  per [place] with sats in mid 90's.  Dcreased O2 to 4L as we walked with sats ranging 88-92 After walking a little further, decreased O2 to 3L with sat drop below 88% and as low as upper 70's. More dyspnea noted on 3L. Titrated O2 back to 4L. Pt able to sustain sats @88 % or better when ambulating. No acute distress noted. Assisted back to room and up to chair.

## 2015-12-15 NOTE — Progress Notes (Signed)
Offered pt assistance with home cpap, but she refused.  Pt stated she doesn't want to wear it tonight.  Pt was advised that RT is available all night and encouraged her to call should she need further assistance.

## 2015-12-16 ENCOUNTER — Inpatient Hospital Stay (HOSPITAL_COMMUNITY): Payer: Medicaid Other

## 2015-12-16 DIAGNOSIS — J9601 Acute respiratory failure with hypoxia: Secondary | ICD-10-CM

## 2015-12-16 DIAGNOSIS — R0600 Dyspnea, unspecified: Secondary | ICD-10-CM

## 2015-12-16 DIAGNOSIS — I509 Heart failure, unspecified: Secondary | ICD-10-CM

## 2015-12-16 LAB — BASIC METABOLIC PANEL WITH GFR
Anion gap: 11 (ref 5–15)
BUN: 19 mg/dL (ref 6–20)
CO2: 33 mmol/L — ABNORMAL HIGH (ref 22–32)
Calcium: 8.9 mg/dL (ref 8.9–10.3)
Chloride: 97 mmol/L — ABNORMAL LOW (ref 101–111)
Creatinine, Ser: 1.03 mg/dL — ABNORMAL HIGH (ref 0.44–1.00)
GFR calc Af Amer: >60 mL/min
GFR calc non Af Amer: 59 mL/min — ABNORMAL LOW
Glucose, Bld: 131 mg/dL — ABNORMAL HIGH (ref 65–99)
Potassium: 3 mmol/L — ABNORMAL LOW (ref 3.5–5.1)
Sodium: 141 mmol/L (ref 135–145)

## 2015-12-16 LAB — GLUCOSE, CAPILLARY
Glucose-Capillary: 143 mg/dL — ABNORMAL HIGH (ref 65–99)
Glucose-Capillary: 167 mg/dL — ABNORMAL HIGH (ref 65–99)
Glucose-Capillary: 166 mg/dL — ABNORMAL HIGH (ref 65–99)

## 2015-12-16 MED ORDER — PERFLUTREN LIPID MICROSPHERE
1.0000 mL | INTRAVENOUS | Status: AC | PRN
  Administered 2015-12-16: 2 mL via INTRAVENOUS
  Filled 2015-12-16: qty 10

## 2015-12-16 MED ORDER — BUDESONIDE-FORMOTEROL FUMARATE 160-4.5 MCG/ACT IN AERO
2.0000 | INHALATION_SPRAY | Freq: Two times a day (BID) | RESPIRATORY_TRACT | Status: DC
  Filled 2015-12-16: qty 6

## 2015-12-16 NOTE — Progress Notes (Signed)
[facility] PCCM  S: Feeling much better, diuresed well, less sinus congestion overnight, oxygenation improving. Ambulated on 3 liters wants to go home   O:  Filed Vitals:   12/15/15 2020 12/15/15 2200 12/16/15 0546 12/16/15 0936  BP:  122/59 116/90   Pulse:  93 86   Temp:  98.8 F (37.1 C) 98.4 F (36.9 C)   TempSrc:  Oral Oral   Resp:  18 18   Height:      Weight:      SpO2: 94% 100% 94% 95%   2.5 liters  Intake/Output Summary (Last 24 hours) at 12/16/15 1120 Last data filed at 12/16/15 1100  Gross per 24 hour  Intake    960 ml  Output   1200 ml  Net   -240 ml    General: Obese, no distress HEENT NCAT pupils equal round reactive to light, MMM  Pulmonary clear, no wheezing, good air movement bilaterally Cardiovascular regular rate and rhythm no murmurs gallops rubs GI: Bowel sounds positive nontender nondistended Extremities minimal edema ankles Neurologic awake alert, oriented 4, moves all 4 extremities  Chest x-ray images personally reviewed from 12/14/2015 showing interstitial edema bilaterally, no infiltrate   BMET    Component Value Date/Time   NA 141 12/16/2015 0454   K 3.0* 12/16/2015 0454   CL 97* 12/16/2015 0454   CO2 33* 12/16/2015 0454   GLUCOSE 131* 12/16/2015 0454   BUN 19 12/16/2015 0454   CREATININE 1.03* 12/16/2015 0454   CALCIUM 8.9 12/16/2015 0454   GFRNONAA 59* 12/16/2015 0454   GFRAA >60 12/16/2015 0454     Impression: Acute on chronic hypoxemic respiratory failure secondary to pulmonary edema and atelectasis, improving. This is not terribly uncommon in the postoperative setting for morbidly obese person with chronic oxygen need secondary to COPD. She is asymptomatic and doing better with improved oxygen needs today. She feels ready to go home Status post laproscopic cholecystectomy: Per surgery Acute sinusitis with sinus congestion and green mucus production Obstructive sleep apnea  Plan: Ambulate on oxygen, titrate to O2 sats ration greater  than 88% Z-Pak 5 days (total) Continue bronchodilators-->resume home symbicort and spiriva w/ PRN albuterol (NO NEED TO CONTINUE nebulized brovana and budesonide).  Continue CPAP at night  Follow-up with pulmonary and critical care medicine as previously scheduled with [name] (February 24).  Erick Colace ACNP-BC [place] Pager # [phone] OR # [phone] if no answer

## 2015-12-16 NOTE — Progress Notes (Signed)
Patient ambulated in hall on 2.5L oxygen maintained stat of 92-94%. Titrated to 2L maintained stat of 84-90%. Patient tolerated well and stopped when necessary to regain breath.

## 2015-12-16 NOTE — Progress Notes (Signed)
  Echocardiogram 2D Echocardiogram has been performed.  Stephanie Schultz 12/16/2015, 2:37 PM

## 2015-12-16 NOTE — Discharge Summary (Signed)
Physician Discharge Summary  Patient ID: Stephanie Schultz MRN: [id number] DOB/AGE: Nov 01, 1956 60 y.o.  Admit date: 12/13/2015 Discharge date: 12/16/2015  Admission Diagnoses: s/p lap chole  Discharge Diagnoses:  Active Problems:   S/P laparoscopic cholecystectomy   Discharged Condition: good  Hospital Course: Pt was admitted post op secondary to pain and O2 issues.  She was admitted to the floor and was undergoing pulm toilet. Her o2sats were her biggest issue.  Pulm was consulted and she was given medical therapy to help with her saturations.  She eventually was able to ambulate well with 2L of O2 and O2 sats stable.  Her pain was controlled.  She was tol PO diet well.  She was deemed stable for DC and [facility] home.  Consults: pulmonary/intensive care  Significant Diagnostic Studies: none  Treatments: surgery: as above  Discharge Exam: Blood pressure 116/90, pulse 86, temperature 98.4 F (36.9 C), temperature source Oral, resp. rate 18, height 5' (1.524 m), weight 101.152 kg (223 lb), last menstrual period 06/04/2000, SpO2 91 %. General appearance: alert and cooperative Cardio: regular rate and rhythm, S1, S2 normal, no murmur, click, rub or gallop GI: soft, non-tender; bowel sounds normal; no masses,  no organomegaly  Disposition: 01-Home or Self Care  Discharge Instructions    Diet - low sodium heart healthy    Complete by:  As directed      Increase activity slowly    Complete by:  As directed             Medication List    TAKE these medications        albuterol (2.5 MG/3ML) 0.083% nebulizer solution  Commonly known as:  PROVENTIL  Take 3 mLs (2.5 mg total) by nebulization 4 (four) times daily. DX J44.9     PROAIR HFA 108 (90 Base) MCG/ACT inhaler  Generic drug:  albuterol  INHALE 2 PUFFS BY MOUTH EVERY 4 HOURS AS NEEDED FOR WHEEZING     allopurinol 100 MG tablet  Commonly known as:  ZYLOPRIM  Take 100 mg by mouth daily.     ALPRAZolam 0.5 MG tablet  Commonly  known as:  XANAX  Take 1 tablet (0.5 mg total) by mouth 2 (two) times daily as needed for anxiety.     budesonide-formoterol 160-4.5 MCG/ACT inhaler  Commonly known as:  SYMBICORT  Inhale 2 puffs into the lungs 2 (two) times daily.     fluticasone 50 MCG/ACT nasal spray  Commonly known as:  FLONASE  INHALE 2 SPRAYS IN EACH NOSTRIL ONCE DAILY     glipiZIDE 10 MG tablet  Commonly known as:  GLUCOTROL  Take 10 mg by mouth daily before breakfast.     HYDROcodone-acetaminophen 7.5-325 MG tablet  Commonly known as:  NORCO  Take 1 tablet by mouth every 4 (four) hours as needed for moderate pain or severe pain.     ibuprofen 800 MG tablet  Commonly known as:  ADVIL,MOTRIN  Take 800 mg by mouth every 8 (eight) hours as needed for moderate pain.     ipratropium 0.02 % nebulizer solution  Commonly known as:  ATROVENT  USE 1 VIAL IN NEBULIZER EVERY 6 HOURS AS NEEDED FOR WHEEZING OR SHORTNESS OF BREATH.     LINZESS 145 MCG Caps capsule  Generic drug:  Linaclotide  Take 145 mcg by mouth every morning.     loratadine 10 MG tablet  Commonly known as:  CLARITIN  Take 10 mg by mouth daily.     NEXIUM 40 MG  capsule  Generic drug:  esomeprazole  TAKE ONE CAPSULE BY MOUTH DAILY AT 12 NOON     nicotine 21 mg/24hr patch  Commonly known as:  NICODERM CQ - dosed in mg/24 hours  Place 21 mg onto the skin daily.     nystatin cream  Commonly known as:  MYCOSTATIN  Apply 1 application topically 2 (two) times daily. Yeast infection     oxyCODONE 5 MG immediate release tablet  Commonly known as:  ROXICODONE  Take 1 tablet (5 mg total) by mouth every 4 (four) hours as needed for severe pain.     PAZEO 0.7 % Soln  Generic drug:  Olopatadine HCl  Apply 1 drop to eye daily.     pravastatin 20 MG tablet  Commonly known as:  PRAVACHOL  Take 20 mg by mouth daily.     pregabalin 75 MG capsule  Commonly known as:  LYRICA  Take 75 mg by mouth 2 (two) times daily.     tiotropium 18 MCG  inhalation capsule  Commonly known as:  SPIRIVA  Place 1 capsule (18 mcg total) into inhaler and inhale daily.     triamterene-hydrochlorothiazide 37.5-25 MG capsule  Commonly known as:  DYAZIDE  Take 1 capsule by mouth daily.     zolpidem 10 MG tablet  Commonly known as:  AMBIEN  Take 10 mg by mouth at bedtime.           Follow-up Information    Follow up with Reyes Ivan, MD. Schedule an appointment as soon as possible for a visit in 3 weeks.   Specialty:  General Surgery   Why:  For wound re-check   Contact information:   [address] [place] [id number] [phone]       Signed: Rosario Jacks., Anne Hahn 12/16/2015, 3:01 PM

## 2015-12-19 ENCOUNTER — Other Ambulatory Visit: Payer: Self-pay | Admitting: Emergency Medicine

## 2015-12-27 ENCOUNTER — Telehealth: Payer: Self-pay | Admitting: Emergency Medicine

## 2015-12-27 DIAGNOSIS — J449 Chronic obstructive pulmonary disease, unspecified: Secondary | ICD-10-CM

## 2015-12-27 NOTE — Telephone Encounter (Signed)
Spoke with pt, requesting an order for a larger home fill system with portable tanks to be sent to [facility].  RB please advise if you're ok with this order.  Thanks!

## 2015-12-29 NOTE — Telephone Encounter (Signed)
Called and spoke with the patient. Informed her of RB's recs. She voiced understanding and had no further questions. Order has been placed with [facility]. Nothing further needed at this time.

## 2015-12-29 NOTE — Telephone Encounter (Signed)
OK with me to have [facility] assess her needs, see if there are any options for larger tanks. She may have the largest available, I do not know.

## 2016-01-09 ENCOUNTER — Telehealth: Payer: Self-pay | Admitting: Emergency Medicine

## 2016-01-09 ENCOUNTER — Ambulatory Visit (INDEPENDENT_AMBULATORY_CARE_PROVIDER_SITE_OTHER): Payer: Medicaid Other | Admitting: Emergency Medicine

## 2016-01-09 ENCOUNTER — Encounter: Payer: Self-pay | Admitting: Emergency Medicine

## 2016-01-09 VITALS — BP 148/70 | HR 90 | Ht 60.0 in | Wt 229.0 lb

## 2016-01-09 DIAGNOSIS — G4733 Obstructive sleep apnea (adult) (pediatric): Secondary | ICD-10-CM

## 2016-01-09 DIAGNOSIS — J449 Chronic obstructive pulmonary disease, unspecified: Secondary | ICD-10-CM | POA: Diagnosis not present

## 2016-01-09 DIAGNOSIS — J309 Allergic rhinitis, unspecified: Secondary | ICD-10-CM | POA: Diagnosis not present

## 2016-01-09 NOTE — Assessment & Plan Note (Signed)
Continue same regimen 

## 2016-01-09 NOTE — Assessment & Plan Note (Signed)
Continue current bronchodilator regimen of Spiriva and Symbicort. Albuterol when necessary. Oxygen has been up titrated to 2.5 L/m Congratulated  her on her smoking cessation. She is very motivated to stay off cigarettes

## 2016-01-09 NOTE — Patient Instructions (Signed)
Please continue your CPAP. We will perform a home CPAP home titration or auto-titration toi see if we need to adjust your pressures.  Continue Spiriva, symbicort and oxygen Continue saline washes, loratadine.  CONGRATULATIONS  On stopping smoking! Keep up the good work! Follow with Dr Lamonte Sakai in 4 months or sooner if you have any problems.

## 2016-01-09 NOTE — Progress Notes (Signed)
Subjective:    Patient ID: Stephanie Schultz, female    DOB: 1956-06-11, 60 y.o.   MRN: [id number]  HPI 60 yo smoker (90 pk-yrs, has cut down to about 2 cig a day), hx HTN, borderline DM, allergies. Dx with COPD at [facility] in [place]. She was started on O2 in ~2009. She had PFT within the last year. She remains fairly active as long as she wears her O2. She can walk about 30-40 feet, has to stop when shopping to rest. She has daily cough, productive of white phlegm. She has rare exacerbations, last was in Jan 2012.   ROV 07/05/11 -- COPD, OSA. Her data from [place] showed she needs CPAP 10. We ordered O2 thru [facility]. She continues to smoke 3 cigs a day. She has been coughing more, having more sputum - yellow/white sputum.  She is having more dyspnea, more wheezing x 3 weeks. She has gained 30+ lbs since coming to [place]. She has not been wearing her O2 reliably.   ROV 09/20/11 -- COPD, OSA. Returns for f/u. Tells me that she has been rx for COPD exacerbation x 2 since our last visit. She tells me that her CXR showed "a spot on one of her lungs". Was done at [facility] ER on 08/23/11. She was treated with pred and azithro on each occasion. She is on Advair and Spiriva. Continues to have severe cough, costochondritic pain. The pred and azithro helped some but not completely. Ran out loratadine 2 weeks ago. Still on omeprazole bid. She is wearing CPAP 10 reliably.   ROV 10/25/11 -- COPD, OSA on CPAP 10. Last time we started [facility] in place of Advair to see if this helped w UA sx. Still on Spiriva. Restarted loratadine and continued omeprazole bid. She feels that her breathing is the same, cough is a bit better on the [facility]. No flares, but she is still having nasal gtt despite the loratadine.   12/17/2011 Acute OV  Pt presents for an acute office visit, complains of increased SOB, wheezing, prod cough with yellow-to-brown mucus  2 weeks . Went to ED on 1.23.13 and was given zpak and prednisone 50mg  x 4 days. CXR with  chronic changing. Has finished abx and steroids. Got some better but cough never went away. Still smoking 1/2 PPD (takes O2 off -goes outside to smoke) .  Remains on Dulera and Spiriva  With no missed doses.  OTC not helping.  Wants referral for home health.  Encouraged to quit smoking.   ROV 02/18/12 -- COPD, OSA on CPAP, chronic cough. Had an AE with residual cough as described above 2/13. Remains on Spiriva + Dulera. Remains on omeprazole bid, flonase, not using loratadine right now. Still smoking about 3 cig a day. She is interested in trying the nicotine patches  ROV 07/08/12 -- COPD, OSA on CPAP, chronic cough. She reports that she quit smoking July 3! She has gained 60 lbs over the last year. Her breathing is stable, still limited. She is wearing her O2 and her CPAP. Continues to take Spiriva + Dulera. She reports that she is still having GERD sx even on omeprazole bid. She ran out of flonase 1 month ago. She has had some epistaxis on R.   ROV 11/25/12 -- COPD, OSA on CPAP, chronic cough. Since last time she restarted smoking - currently at 5 cig a day. She ran out of her [name] a week ago. Still taking spiriva. She has been having more cough over about  2 weeks. She has been using albuterol nebs bid - about to run out of it, need refill. She remains on loratadine, fluticasone, omeprazole. She hasn't been using CPAP regularly - needs new mask, this is being arranged. She is to undergo breast bx for a nodule this week.    [facility] follow up 12/19/12 Patient returns for post hospital followup. Patient was admitted January 24-27th 2014 for COPD, exacerbation. She was treated with IV antibiotics, nebulized bronchodilators, and IV steroids.  She was discharged on a steroid taper, along with Levaquin.  Since discharge. She has had  some improvement however, continues to have productive cough, with some thick, yellow. Mucus. Unfortunately patient has restarted smoking. Smoking cessation education was  given. Chest x-ray today shows no acute process Patient denies any hemoptysis, orthopnea, PND, or leg swelling.   03/11/13 -- COPD, OSA on CPAP, chronic cough, tobacco use. Admitted 3/6 - 3/11 for AE-COPD. She feels better. She is having some nasal congestion, on loratadine + nasal steroid. ENT saw her and increased her PPI. She is wearing her CPAP.   ER Follow up 06/12/13 --  pt reports breathing is doing much better since last ED visit 7/10 w SOB given pred and abx but did not take abx-- wearing CPAP 1-2 nights per week-- states it makes her feel claustrophobic-- denies any other concerns at this time Cough and congestion have improved. Shortness, of breath is also decreased. Patient denies any hemoptysis, orthopnea, PND, or increased leg swelling  ROV 12/08/13 -- COPD, OSA on CPAP, chronic cough, tobacco use.  She underwent PFT on 11/16/13 > severe AFL, no BD response, superimposed restriction, decreased DLCO.  Her FEV1 is stable from '09, FVC is decrease slightly. She is upset today because she was unable to be seen on 1/5 when she was here for PFT and was sick. She is also upset that I didn't come to see her when she was admitted to hospital since last Summer. She is still smoking a few cigarettes a day. Remains on Spiriva, symbicort, duonebs prn. Her O2 is set at  2L/min. She is wearing CPAP reliably   ROV 07/06/14 -- follows for severe COPD, OSA, cough, continued tobacco. She was seen by Dr Annamaria Boots in 6/15, and then again at the ED 8/24 (yesterday) for dyspnea, no chest tightness, no cough. She was given solumedrol and a script for a pred taper > she hasn't filled it.  She has taken her spiriva and symbicort. She is reliable with her CPAP mask.  She is on omeprazole and loratadine.    ROV 08/03/14 -- severe COPD, OSA on CPAP, cough, continued tobacco use, about 3 cigarettes daily. Also some component UA disease. She continues to cough and have midfollow up for  sternal chest pain. She is reliable with her  omeprazole 40.  She has been using albuterol.   ROV 01/10/15 -- follow up for severe COPD OSA, cough and tobacco use.  She follows up today following treatment for an apparent AE of her COPD and UA disease. She is doing a lot of coughing, causing mid abd and back soreness. She c/o some baseline soreness and chest tightness. She has loratdine, has not been nasal steroid for several weeks. She is on omeprazole.   ROV 04/18/15 -- follow-up visit for COPD/asthma, chronic cough and upper airway irritation, tobacco use, and obstructive sleep apnea on CPAP. She was admitted to the hospital 4/7-12 and then 5/19-20 with AE-COPD and L rib fractures respectively. She is smoking  up to 1/2 pk a day, often aroun 3-4 cig a day. She is using symbicort + spiriva.  She is using albuterol on a schedule as well qid. She is also on flonase bid, loratadine. She is not wearing her CPAP, has not worn in in a month.  Feels close to her usual baseline. Still having L rib pain.   I reviewed the hospital notes from April and May.  ROV 06/21/15 -- follow-up visit for chronic cough, upper airway irritation syndrome, COPD with a bronchodilator response, and obstructive sleep apnea on CPAP.   She has unfortunately had rib fx's. She is still smoking, has had some stretches of 8-13 days when she has not smoked any. He breathing has been better when she is off the cigarettes. She is using her albuterol at least 2x a day. She is on flonase, loratadine. She has had to stop the flonase sometimes due nasal irritation. She is wearing her CPAP, every night. Had her   ROV 09/30/15 -- follow-up visit for COPD with an asthmatic component, upper airway irritation syndrome and chronic cough. She also has a history of sleep apnea and uses CPAP, good complaining .  She had been well until about 2 weeks ago when she caught URI  from her grandchildren. She was treated with prednisone and azithro. She is now improved, feels back to baseline. She is having  gallbladder trouble, but has not pursued surgery due to her pulmonary risk. She is still considering this, but understands that her risk for prolonged ventilation is high.  Doing spiriva and symbicort, tolerates well.   ROV 01/09/16 -- patient has a history of COPD with a positive bronchodilator response, upper airway irritation syndrome and chronic cough. She also has sleep apnea and is on CPAP. She was last seen in our office by TP for an acute exacerbation. She is currently managed on Symbicort and Spiriva. Supplemental oxygen at 2.5 L/m. She is doing well > . She is using saline washes, she is off nasal spray due to epistaxis, on loratadine. She has stayed off cigarettes since her GB surgery. Her breathing has clearly benefited. She is taking spiriva and symbicort reliably. She is wearing CPAP, but states that she has a HA daily.    ROS  See history of present illness   Objective:   Physical Exam Filed Vitals:   01/09/16 0909  BP: 148/70  Pulse: 90  Height: 5' (1.524 m)  Weight: 229 lb (103.874 kg)  SpO2: 94%    Gen: Obese, in no distress,  normal affect  ENT: No lesions,  mouth clear,  oropharynx clear, no postnasal drip, hoarse voice  Neck: No JVD, no TMG, no carotid bruits, loud exp stridor and hoarse voice  Lungs: No use of accessory muscles, distant, no wheezes   Cardiovascular: RRR, heart sounds normal, no murmur or gallops, no peripheral edema  Musculoskeletal: No deformities, no cyanosis or clubbing  Neuro: alert, non focal  Skin: Warm, no lesions or rashes, old scars on B UE's   Assessment & Plan:   COPD (chronic obstructive pulmonary disease) Continue current bronchodilator regimen of Spiriva and Symbicort. Albuterol when necessary. Oxygen has been up titrated to 2.5 L/m Congratulated  her on her smoking cessation. She is very motivated to stay off cigarettes  Obstructive sleep apnea She is having some headache at night even though she is compliant with her  CPAP. I would like to perform a home CPAP titration study to ensure that her pressures are adequate.  Chronic allergic rhinitis Continue same regimen

## 2016-01-09 NOTE — Assessment & Plan Note (Signed)
She is having some headache at night even though she is compliant with her CPAP. I would like to perform a home CPAP titration study to ensure that her pressures are adequate.

## 2016-01-09 NOTE — Telephone Encounter (Signed)
Spoke with Melissa. She will need a range that RB wants tested on the pt. Order has been fixed. Nothing further was needed.

## 2016-01-23 ENCOUNTER — Encounter (HOSPITAL_COMMUNITY): Payer: Self-pay | Admitting: Emergency Medicine

## 2016-01-23 ENCOUNTER — Observation Stay (HOSPITAL_COMMUNITY)
Admission: EM | Admit: 2016-01-23 | Discharge: 2016-01-26 | Disposition: A | Discharge status: Home / Self Care | Payer: Medicaid Other | Attending: Internal Medicine | Admitting: Internal Medicine

## 2016-01-23 ENCOUNTER — Emergency Department (HOSPITAL_COMMUNITY): Payer: Medicaid Other

## 2016-01-23 DIAGNOSIS — R0602 Shortness of breath: Secondary | ICD-10-CM | POA: Diagnosis present

## 2016-01-23 DIAGNOSIS — M109 Gout, unspecified: Secondary | ICD-10-CM | POA: Diagnosis not present

## 2016-01-23 DIAGNOSIS — J441 Chronic obstructive pulmonary disease with (acute) exacerbation: Principal | ICD-10-CM | POA: Insufficient documentation

## 2016-01-23 DIAGNOSIS — J309 Allergic rhinitis, unspecified: Secondary | ICD-10-CM | POA: Insufficient documentation

## 2016-01-23 DIAGNOSIS — Z7984 Long term (current) use of oral hypoglycemic drugs: Secondary | ICD-10-CM | POA: Insufficient documentation

## 2016-01-23 DIAGNOSIS — Z9981 Dependence on supplemental oxygen: Secondary | ICD-10-CM | POA: Diagnosis not present

## 2016-01-23 DIAGNOSIS — M1990 Unspecified osteoarthritis, unspecified site: Secondary | ICD-10-CM | POA: Diagnosis not present

## 2016-01-23 DIAGNOSIS — G8929 Other chronic pain: Secondary | ICD-10-CM | POA: Insufficient documentation

## 2016-01-23 DIAGNOSIS — D573 Sickle-cell trait: Secondary | ICD-10-CM | POA: Insufficient documentation

## 2016-01-23 DIAGNOSIS — G4733 Obstructive sleep apnea (adult) (pediatric): Secondary | ICD-10-CM | POA: Insufficient documentation

## 2016-01-23 DIAGNOSIS — R17 Unspecified jaundice: Secondary | ICD-10-CM | POA: Insufficient documentation

## 2016-01-23 DIAGNOSIS — Z9049 Acquired absence of other specified parts of digestive tract: Secondary | ICD-10-CM | POA: Diagnosis not present

## 2016-01-23 DIAGNOSIS — I10 Essential (primary) hypertension: Secondary | ICD-10-CM | POA: Diagnosis not present

## 2016-01-23 DIAGNOSIS — Z8541 Personal history of malignant neoplasm of cervix uteri: Secondary | ICD-10-CM | POA: Diagnosis not present

## 2016-01-23 DIAGNOSIS — K219 Gastro-esophageal reflux disease without esophagitis: Secondary | ICD-10-CM | POA: Insufficient documentation

## 2016-01-23 DIAGNOSIS — K589 Irritable bowel syndrome without diarrhea: Secondary | ICD-10-CM | POA: Diagnosis not present

## 2016-01-23 DIAGNOSIS — Z79899 Other long term (current) drug therapy: Secondary | ICD-10-CM | POA: Insufficient documentation

## 2016-01-23 DIAGNOSIS — E785 Hyperlipidemia, unspecified: Secondary | ICD-10-CM | POA: Diagnosis not present

## 2016-01-23 DIAGNOSIS — Z6841 Body Mass Index (BMI) 40.0 and over, adult: Secondary | ICD-10-CM | POA: Diagnosis not present

## 2016-01-23 DIAGNOSIS — F419 Anxiety disorder, unspecified: Secondary | ICD-10-CM | POA: Insufficient documentation

## 2016-01-23 DIAGNOSIS — Z87891 Personal history of nicotine dependence: Secondary | ICD-10-CM | POA: Diagnosis not present

## 2016-01-23 DIAGNOSIS — E119 Type 2 diabetes mellitus without complications: Secondary | ICD-10-CM | POA: Diagnosis not present

## 2016-01-23 DIAGNOSIS — D649 Anemia, unspecified: Secondary | ICD-10-CM | POA: Insufficient documentation

## 2016-01-23 DIAGNOSIS — J9611 Chronic respiratory failure with hypoxia: Secondary | ICD-10-CM | POA: Insufficient documentation

## 2016-01-23 LAB — CBC WITH DIFFERENTIAL/PLATELET
Basophils Absolute: 0 10*3/uL (ref 0.0–0.1)
Basophils Relative: 0 %
Eosinophils Absolute: 0.2 10*3/uL (ref 0.0–0.7)
Eosinophils Relative: 3 %
HEMATOCRIT: 32.4 % — AB (ref 36.0–46.0)
HEMOGLOBIN: 11.2 g/dL — AB (ref 12.0–15.0)
LYMPHS ABS: 1.9 10*3/uL (ref 0.7–4.0)
LYMPHS PCT: 30 %
MCH: 27.4 pg (ref 26.0–34.0)
MCHC: 34.6 g/dL (ref 30.0–36.0)
MCV: 79.2 fL (ref 78.0–100.0)
Monocytes Absolute: 0.3 10*3/uL (ref 0.1–1.0)
Monocytes Relative: 5 %
NEUTROS PCT: 62 %
Neutro Abs: 3.9 10*3/uL (ref 1.7–7.7)
Platelets: 239 10*3/uL (ref 150–400)
RBC: 4.09 MIL/uL (ref 3.87–5.11)
RDW: 15 % (ref 11.5–15.5)
WBC: 6.2 10*3/uL (ref 4.0–10.5)

## 2016-01-23 LAB — GLUCOSE, CAPILLARY: Glucose-Capillary: 206 mg/dL — ABNORMAL HIGH (ref 65–99)

## 2016-01-23 LAB — BASIC METABOLIC PANEL
Anion gap: 10 (ref 5–15)
BUN: 20 mg/dL (ref 6–20)
CHLORIDE: 102 mmol/L (ref 101–111)
CO2: 29 mmol/L (ref 22–32)
Calcium: 9.8 mg/dL (ref 8.9–10.3)
Creatinine, Ser: 0.99 mg/dL (ref 0.44–1.00)
GFR calc Af Amer: >60 mL/min (ref >60)
GLUCOSE: 109 mg/dL — AB (ref 65–99)
POTASSIUM: 3.8 mmol/L (ref 3.5–5.1)
Sodium: 141 mmol/L (ref 135–145)

## 2016-01-23 LAB — BRAIN NATRIURETIC PEPTIDE: B Natriuretic Peptide: 16.1 pg/mL (ref 0.0–100.0)

## 2016-01-23 LAB — I-STAT TROPONIN, ED: Troponin i, poc: 0 ng/mL (ref 0.00–0.08)

## 2016-01-23 MED ORDER — LORAZEPAM 2 MG/ML IJ SOLN
0.5000 mg | Freq: Once | INTRAMUSCULAR | Status: AC
  Administered 2016-01-23: 0.5 mg via INTRAVENOUS
  Filled 2016-01-23: qty 1

## 2016-01-23 MED ORDER — ALLOPURINOL 100 MG PO TABS
100.0000 mg | ORAL_TABLET | Freq: Every day | ORAL | Status: DC
  Administered 2016-01-24 – 2016-01-26 (×3): 100 mg via ORAL
  Filled 2016-01-23 (×3): qty 1

## 2016-01-23 MED ORDER — POTASSIUM CHLORIDE CRYS ER 20 MEQ PO TBCR
40.0000 meq | EXTENDED_RELEASE_TABLET | Freq: Once | ORAL | Status: AC
  Administered 2016-01-23: 40 meq via ORAL
  Filled 2016-01-23: qty 2

## 2016-01-23 MED ORDER — AZITHROMYCIN 250 MG PO TABS
250.0000 mg | ORAL_TABLET | Freq: Every day | ORAL | Status: DC
  Administered 2016-01-25 – 2016-01-26 (×2): 250 mg via ORAL
  Filled 2016-01-23 (×2): qty 1

## 2016-01-23 MED ORDER — PRAVASTATIN SODIUM 20 MG PO TABS
20.0000 mg | ORAL_TABLET | Freq: Every day | ORAL | Status: DC
  Administered 2016-01-24 – 2016-01-25 (×2): 20 mg via ORAL
  Filled 2016-01-23 (×3): qty 1

## 2016-01-23 MED ORDER — INSULIN ASPART 100 UNIT/ML ~~LOC~~ SOLN
0.0000 [IU] | Freq: Every day | SUBCUTANEOUS | Status: DC
  Administered 2016-01-23: 2 [IU] via SUBCUTANEOUS

## 2016-01-23 MED ORDER — LINACLOTIDE 145 MCG PO CAPS
145.0000 ug | ORAL_CAPSULE | Freq: Every day | ORAL | Status: DC | PRN
  Filled 2016-01-23: qty 1

## 2016-01-23 MED ORDER — PANTOPRAZOLE SODIUM 40 MG PO TBEC
80.0000 mg | DELAYED_RELEASE_TABLET | Freq: Every day | ORAL | Status: DC
  Administered 2016-01-24 – 2016-01-26 (×3): 80 mg via ORAL
  Filled 2016-01-23 (×3): qty 2

## 2016-01-23 MED ORDER — TRIAMTERENE-HCTZ 37.5-25 MG PO TABS
1.0000 | ORAL_TABLET | Freq: Every day | ORAL | Status: DC
  Administered 2016-01-24 – 2016-01-26 (×3): 1 via ORAL
  Filled 2016-01-23 (×3): qty 1

## 2016-01-23 MED ORDER — ALBUTEROL (5 MG/ML) CONTINUOUS INHALATION SOLN
10.0000 mg/h | INHALATION_SOLUTION | RESPIRATORY_TRACT | Status: AC
  Administered 2016-01-23: 10 mg/h via RESPIRATORY_TRACT
  Filled 2016-01-23: qty 20

## 2016-01-23 MED ORDER — PREDNISONE 20 MG PO TABS
40.0000 mg | ORAL_TABLET | Freq: Every day | ORAL | Status: DC
  Administered 2016-01-24 – 2016-01-26 (×3): 40 mg via ORAL
  Filled 2016-01-23 (×4): qty 2

## 2016-01-23 MED ORDER — IPRATROPIUM-ALBUTEROL 0.5-2.5 (3) MG/3ML IN SOLN
3.0000 mL | Freq: Once | RESPIRATORY_TRACT | Status: AC
  Administered 2016-01-23: 3 mL via RESPIRATORY_TRACT
  Filled 2016-01-23: qty 3

## 2016-01-23 MED ORDER — TIOTROPIUM BROMIDE MONOHYDRATE 18 MCG IN CAPS
18.0000 ug | ORAL_CAPSULE | Freq: Every day | RESPIRATORY_TRACT | Status: DC
  Administered 2016-01-24 – 2016-01-26 (×3): 18 ug via RESPIRATORY_TRACT
  Filled 2016-01-23: qty 5

## 2016-01-23 MED ORDER — METHYLPREDNISOLONE SODIUM SUCC 125 MG IJ SOLR
125.0000 mg | Freq: Once | INTRAMUSCULAR | Status: AC
  Administered 2016-01-23: 125 mg via INTRAVENOUS
  Filled 2016-01-23: qty 2

## 2016-01-23 MED ORDER — PREGABALIN 75 MG PO CAPS
75.0000 mg | ORAL_CAPSULE | Freq: Two times a day (BID) | ORAL | Status: DC
  Administered 2016-01-23 – 2016-01-26 (×6): 75 mg via ORAL
  Filled 2016-01-23 (×6): qty 1

## 2016-01-23 MED ORDER — ALBUTEROL SULFATE (2.5 MG/3ML) 0.083% IN NEBU: 2.5000 mg | INHALATION_SOLUTION | RESPIRATORY_TRACT | Status: DC | PRN

## 2016-01-23 MED ORDER — AZITHROMYCIN 500 MG PO TABS
500.0000 mg | ORAL_TABLET | Freq: Every day | ORAL | Status: AC
  Administered 2016-01-24: 500 mg via ORAL
  Filled 2016-01-23: qty 1

## 2016-01-23 MED ORDER — FUROSEMIDE 10 MG/ML IJ SOLN
20.0000 mg | Freq: Once | INTRAMUSCULAR | Status: AC
  Administered 2016-01-23: 20 mg via INTRAVENOUS
  Filled 2016-01-23: qty 4

## 2016-01-23 MED ORDER — ENOXAPARIN SODIUM 40 MG/0.4ML ~~LOC~~ SOLN
40.0000 mg | Freq: Every day | SUBCUTANEOUS | Status: DC
  Administered 2016-01-23 – 2016-01-25 (×3): 40 mg via SUBCUTANEOUS
  Filled 2016-01-23 (×4): qty 0.4

## 2016-01-23 MED ORDER — LORATADINE 10 MG PO TABS
10.0000 mg | ORAL_TABLET | Freq: Every day | ORAL | Status: DC
  Administered 2016-01-24 – 2016-01-26 (×3): 10 mg via ORAL
  Filled 2016-01-23 (×3): qty 1

## 2016-01-23 MED ORDER — ALBUTEROL SULFATE (2.5 MG/3ML) 0.083% IN NEBU
2.5000 mg | INHALATION_SOLUTION | Freq: Four times a day (QID) | RESPIRATORY_TRACT | Status: DC
  Administered 2016-01-24: 2.5 mg via RESPIRATORY_TRACT
  Filled 2016-01-23: qty 3

## 2016-01-23 MED ORDER — INSULIN ASPART 100 UNIT/ML ~~LOC~~ SOLN
0.0000 [IU] | Freq: Three times a day (TID) | SUBCUTANEOUS | Status: DC
  Administered 2016-01-24 – 2016-01-26 (×4): 2 [IU] via SUBCUTANEOUS
  Administered 2016-01-26: 5 [IU] via SUBCUTANEOUS

## 2016-01-23 MED ORDER — HYDROCODONE-ACETAMINOPHEN 7.5-325 MG PO TABS
1.0000 | ORAL_TABLET | Freq: Three times a day (TID) | ORAL | Status: DC | PRN
  Administered 2016-01-24: 1 via ORAL
  Filled 2016-01-23: qty 1

## 2016-01-23 NOTE — H&P (Signed)
History and Physical  Patient Name: Najai Jolivet     [id number]    DOB: 04/01/1956    DOA: 01/23/2016 Referring physician: Doreen Salvage, PA-C PCP: Barbette Merino, MD      Chief Complaint: SOB with exertion  HPI: Jamiemarie Chowdhury is a 60 y.o. female with a past medical history significant for COPD on home O2, allergic rhinitis, OSA, DM2, and HTN who presents with worsening shortness of breath.  The patient was at her baseline until about 4 days ago when she started to develop worsening shortness of breath with exertion.  At baseline she is able to clean her home and walk from one room to the other without dyspnea, but uses a cart at [employer]. However over the last 3 days she has been short of breath with any exertion, wheezing, and with chest congestion. There is been no chest pain, no chest pressure, no fever.  She has had an increase in purulent sputum, as well as sinus pain and headache. No sick contacts.  In the ED, she was afebrile, tachypneic, and required supplemental oxygen at her baseline. A chest x-ray showed no new opacity.  Cr 1.0, WBC 6.2, hemoglobin 11.2, BNP normal, TNI negative.  She was administered furosemide, DuoNeb, and Solu-Medrol, and TRH were asked to evaluate for admission.     Review of Systems:  Pt complains of shortness of breath with exertion, wheezing, chest congestion, sinus pressure. Pt denies any chest pain, chest tightness, chest discomfort, leg swelling, orthopnea, paroxysmal nocturnal dyspnea.  All other systems negative except as just noted or noted in the history of present illness.  Allergies  Allergen Reactions  . Acetaminophen Other (See Comments)    Pt was told that she could not take Tylenol, does not know why    Prior to Admission medications   Medication Sig Start Date End Date Taking? Authorizing Provider  albuterol (PROVENTIL) (2.5 MG/3ML) 0.083% nebulizer solution Take 3 mLs (2.5 mg total) by nebulization 4 (four) times daily. DX J44.9 Patient  taking differently: Take 2.5 mg by nebulization every 6 (six) hours as needed for wheezing or shortness of breath. DX J44.9 05/03/15  Yes Collene Gobble, MD  allopurinol (ZYLOPRIM) 100 MG tablet Take 100 mg by mouth daily.   Yes Historical Provider, MD  ALPRAZolam Duanne Moron) 0.5 MG tablet Take 1 tablet (0.5 mg total) by mouth 2 (two) times daily as needed for anxiety. 02/22/15  Yes Bonnielee Haff, MD  budesonide-formoterol [facility]) 160-4.5 MCG/ACT inhaler Inhale 2 puffs into the lungs 2 (two) times daily. 05/03/15  Yes Collene Gobble, MD  glipiZIDE (GLUCOTROL) 10 MG tablet Take 10 mg by mouth 2 (two) times daily before a meal.    Yes Historical Provider, MD  HYDROcodone-acetaminophen (NORCO) 7.5-325 MG per tablet Take 1 tablet by mouth every 4 (four) hours as needed for moderate pain or severe pain. Patient taking differently: Take 1 tablet by mouth every 8 (eight) hours as needed for moderate pain or severe pain.  04/01/15  Yes Thurnell Lose, MD  ibuprofen (ADVIL,MOTRIN) 800 MG tablet Take 800 mg by mouth every 8 (eight) hours as needed for moderate pain.    Yes Historical Provider, MD  ipratropium (ATROVENT) 0.02 % nebulizer solution USE 1 VIAL IN NEBULIZER EVERY 6 HOURS AS NEEDED FOR WHEEZING OR SHORTNESS OF BREATH. 10/24/15  Yes Collene Gobble, MD  Linaclotide (LINZESS) 145 MCG CAPS capsule Take 145 mcg by mouth daily as needed (CONSTIPATION).    Yes Historical Provider, MD  loratadine (  CLARITIN) 10 MG tablet Take 10 mg by mouth daily.   Yes Historical Provider, MD  NEXIUM 40 MG capsule TAKE ONE CAPSULE BY MOUTH EVERY MORNING Patient taking differently: TAKE 40 MG BY MOUTH EVERY MORNING 12/19/15  Yes Collene Gobble, MD  nystatin cream (MYCOSTATIN) Apply 1 application topically 2 (two) times daily. Yeast infection   Yes Historical Provider, MD  Olopatadine HCl (PAZEO) 0.7 % SOLN Apply 1 drop to eye daily.   Yes Historical Provider, MD  pioglitazone (ACTOS) 30 MG tablet Take 30 mg by mouth daily.  12/29/15  Yes Historical Provider, MD  pravastatin (PRAVACHOL) 20 MG tablet Take 20 mg by mouth daily.   Yes Historical Provider, MD  pregabalin (LYRICA) 75 MG capsule Take 75 mg by mouth 2 (two) times daily.   Yes Historical Provider, MD  PROAIR HFA 108 (90 Base) MCG/ACT inhaler INHALE 2 PUFFS BY MOUTH EVERY 4 HOURS AS NEEDED FOR WHEEZING 12/19/15  Yes Collene Gobble, MD  tiotropium (SPIRIVA) 18 MCG inhalation capsule Place 1 capsule (18 mcg total) into inhaler and inhale daily. 05/03/15  Yes Collene Gobble, MD  triamterene-hydrochlorothiazide (DYAZIDE) 37.5-25 MG per capsule Take 1 capsule by mouth daily.   Yes Historical Provider, MD  zolpidem (AMBIEN) 10 MG tablet Take 10 mg by mouth at bedtime.   Yes Historical Provider, MD    Past Medical History  Diagnosis Date  . Hypertension   . Anxiety   . OSA on CPAP     CPAP at night   . Dizziness     pt believes this is motion sickness or vertigo  . GERD (gastroesophageal reflux disease)   . Headache(784.0)   . Cancer ([place]) 1990    cervical   . Anemia   . Morbid obesity ([place])   . Sickle cell trait ([place])   . Hx of cardiac catheterization     a. LHC at [facility] in [place], [place] 09/2008:  Normal coronary arteries EF 70%.  . Tobacco abuse     a. up to 3ppd from age 30 to 37, now 1/4 ppd (01/2013)  . Shortness of breath   . Pneumonia   . Diabetes mellitus without complication ([place])   . Emphysema   . COPD (chronic obstructive pulmonary disease) ([place])     dR . ROBERT BYOUM  . Hyperlipidemia   . Supplemental oxygen dependent     2L CONTINUOUSLEY  . Numbness     FINGER TIPS  . Arthritis   . Gout   . Gallstones     Past Surgical History  Procedure Laterality Date  . Tubal ligation    . Cardiac catheterization    . Colonoscopy  09/05/2012    Procedure: COLONOSCOPY;  Surgeon: Beryle Beams, MD;  Location: [facility];  Service: Endoscopy;  Laterality: N/A;  . Colonoscopy with propofol N/A 09/02/2015    Procedure: COLONOSCOPY WITH PROPOFOL;   Surgeon: Carol Ada, MD;  Location: [facility];  Service: Endoscopy;  Laterality: N/A;  . Cholecystectomy N/A 12/13/2015    Procedure: LAPAROSCOPIC CHOLECYSTECTOMY;  Surgeon: Ralene Ok, MD;  Location: [facility] ORS;  Service: General;  Laterality: N/A;    Family history: family history includes Diabetes in her mother; Lung cancer in her paternal aunt and paternal grandfather; Myasthenia gravis in her mother; Other in her father.  Social History: Patient lives alone. She quit smoking recently. She is independent with all ADLs. She is ambulatory without a cane short distances, but uses a cart for longer distances because  of dyspnea.       Physical Exam: BP 137/62 mmHg  Pulse 99  Temp(Src) 98 F (36.7 C) (Oral)  Resp 20  SpO2 95%  LMP 06/04/2000 (LMP Unknown) General appearance: Obese adult female, alert and in mild distress from dyspnea.   Eyes: Mild icterus?, conjunctiva pink, lids and lashes normal.     ENT: No nasal deformity, discharge, or epistaxis.  No sinus swelling or erythema.  OP moist without lesions.   Lymph: No cervical or supraclavicular lymphadenopathy. Skin: Warm and dry.  No suspicious rashes or lesions. Cardiac: RRR, nl S1-S2, no murmurs appreciated.  Capillary refill is brisk.  JVP not visible due to habitus.  No LE edema.  Radial and DP pulses 2+ and symmetric. Respiratory: Normal respiratory rate and rhythm.  Pursed lip breathing, wheezes few auscultated with stethoscope. Inspiration small. No rales. Abdomen: Abdomen soft without rigidity.  No TTP. MSK: No deformities or effusions. Neuro: Sensorium intact and responding to questions, attention normal.  Speech is fluent.  Moves all extremities equally and with normal coordination.    Psych: Behavior appropriate.  Affect normal.  No evidence of aural or visual hallucinations or delusions.       Labs on Admission:  The metabolic panel shows normal electrolytes and renal function. BNP is normal. Troponin  negative. The complete blood count shows mild normocytic anemia, no leukocytosis.   Radiological Exams on Admission: Personally reviewed: Dg Chest 2 View  01/23/2016  CLINICAL DATA:  Shortness of breath for 3 days, on oxygen 3 years, history COPD/emphysema, hypertension, GERD, diabetes mellitus, pneumonia, cervical cancer, former smoker EXAM: CHEST  2 VIEW COMPARISON:  12/14/2015 FINDINGS: Enlargement of cardiac silhouette with pulmonary vascular congestion. Atherosclerotic calcification aorta. Subsegmental atelectasis RIGHT base persists. Lungs otherwise clear. No pleural effusion or pneumothorax. Question diffuse idiopathic skeletal hyperostosis. IMPRESSION: Enlargement of cardiac silhouette with pulmonary vascular congestion. Subsegmental atelectasis RIGHT base. Electronically Signed   By: Lavonia Dana M.D.   On: 01/23/2016 13:16    EKG: Independently reviewed. Rate 81, QTC 464, sinus rhythm without ST changes.    Assessment/Plan 1. COPD flare in chronic asthma with chronic hypoxic respiratory failure:  This is new.  The patient presents with dyspnea with exertion for 4 days without chest tightness, pain or pressure nor leg swelling or worsening orthopnea.  She has increased wheezing and nasal congestion, suggesting URI-triggered flare of her chronic asthma COPD.  PE is doubted.  ACS is not suggested by ECG or initial troponin, but anginal equivalent is possible.  CHF suggested by CXR but BNP low. -Prednisone 40 mg daily for 5 days -Albuterol scheduled and PRN -Azithromycin for 5 days -Walking desat test tomorrow -Hold home steroid LABA inhaler -Continue home tiotropium -Continue home loratadine -Cycle troponins -COPD protocol   2. DM2:  HgbA1c in Jan 8.3% -Sliding scale while inpatient, hold glipizide and pioglitazone  3. HTN:  -Continue home Dyazide  4. OSA:  -Continue CPAP  5. Chronic anemia:  Stable  6. Scleral icterus:  Etiology unclear.  -Check LFT tomorrow  7.  IBS:  -Continue home linaclotide  8. Chronic pain: -Continue home Norco PRN and pregabalin  9. Gout: Clinically inactive -Continue home allopurinol     DVT PPx: Lovenox Diet: Heart healthy diabetic Consultants: None Code Status: FULL Family Communication: None present  Medical decision making: What exists of the patient's previous chart was reviewed in depth and the case was discussed with Doreen Salvage, PA_C. Patient seen 9:40 PM on 01/23/2016.  Disposition Plan:  I recommend admission to med surg bed, observation status for dyspnea.  Clinical condition: stable.  Anticipate observation overnight, if improving and no increased oxygen requirement tomorrrow, can discharge to home.      Edwin Dada [facility] Pager [phone]

## 2016-01-23 NOTE — Progress Notes (Signed)
[facility] consulted for medication assistance.  This patient has Medicaid [employer], medication cost is usually three dollars or less.  Patient will not be eligible for medication assistance program offered by [facility].  No further EDCM needs at this time.

## 2016-01-23 NOTE — ED Provider Notes (Signed)
CSN: [id number]     Arrival date & time 01/23/16  1053 History   First MD Initiated Contact with Patient 01/23/16 1518     Chief Complaint  Patient presents with  . Shortness of Breath     (Consider location/radiation/quality/duration/timing/severity/associated sxs/prior Treatment) HPI   Stephanie Schultz is a(n) 60 y.o. female who presents to the ED with cc of Shortness of breath. She has a history of COPD,. She states she has had "fluid on the lungs before." The patient is on 2 L of oxygen via nasal cannula at home 24 hours a day. She states over the past 4 days. She's had worsening shortness of breath. She states that it is only with exertion. She states that normally she can clean her house and walked between rooms. However, over the past couple days. She's had severe shortness of breath. She states she gets extremely winded walking from room to room. She endorses PND and orthopnea. She denies any weight gain or swelling in the ankles. She denies unilateral leg swelling. She has chronic central chest pain which she attributes to the weight of her breasts. It is reproducible on examination. She does not have chest pain with exertion. She states she has been taking her nebulizer treatments as directed. She denies any wheezing.  Past Medical History  Diagnosis Date  . Hypertension   . Anxiety   . OSA on CPAP     CPAP at night   . Dizziness     pt believes this is motion sickness or vertigo  . GERD (gastroesophageal reflux disease)   . Headache(784.0)   . Cancer ([place]) 1990    cervical   . Anemia   . Morbid obesity ([place])   . Sickle cell trait ([place])   . Hx of cardiac catheterization     a. LHC at [facility] in [place], [place] 09/2008:  Normal coronary arteries EF 70%.  . Tobacco abuse     a. up to 3ppd from age 55 to 59, now 1/4 ppd (01/2013)  . Shortness of breath   . Pneumonia   . Diabetes mellitus without complication ([place])   . Emphysema   . COPD (chronic obstructive pulmonary disease) ([place])      dR . ROBERT BYOUM  . Hyperlipidemia   . Supplemental oxygen dependent     2L CONTINUOUSLEY  . Numbness     FINGER TIPS  . Arthritis   . Gout   . Gallstones    Past Surgical History  Procedure Laterality Date  . Tubal ligation    . Cardiac catheterization    . Colonoscopy  09/05/2012    Procedure: COLONOSCOPY;  Surgeon: Beryle Beams, MD;  [facility];  Service: Endoscopy;  Laterality: N/A;  . Colonoscopy with propofol N/A 09/02/2015    Procedure: COLONOSCOPY WITH PROPOFOL;  Surgeon: Carol Ada, MD;  [facility];  Service: Endoscopy;  Laterality: N/A;  . Cholecystectomy N/A 12/13/2015    Procedure: LAPAROSCOPIC CHOLECYSTECTOMY;  Surgeon: Ralene Ok, MD;  Location: [facility];  Service: General;  Laterality: N/A;   Family History  Problem Relation Age of Onset  . Lung cancer Paternal Aunt   . Lung cancer Paternal Grandfather   . Other Father     unaware of father's medical history  . Diabetes Mother     alive @ 47  . Myasthenia gravis Mother   . Other      multiple siblings a&w.   Social History  Substance Use Topics  . Smoking  status: Former Smoker -- 0.50 packs/day for 36 years    Types: Cigarettes    Quit date: 11/11/2015  . Smokeless tobacco: Never Used     Comment: Approx 90 pk-yrs (up to 3ppd until ~ 2009). Smoking 3 cigs per day now.  . Alcohol Use: No   OB History    No data available     Review of Systems  Ten systems reviewed and are negative for acute change, except as noted in the HPI.    Allergies  Acetaminophen  Home Medications   Prior to Admission medications   Medication Sig Start Date End Date Taking? Authorizing Provider  albuterol (PROVENTIL) (2.5 MG/3ML) 0.083% nebulizer solution Take 3 mLs (2.5 mg total) by nebulization 4 (four) times daily. DX J44.9 Patient taking differently: Take 2.5 mg by nebulization every 6 (six) hours as needed for wheezing or shortness of breath. DX J44.9 05/03/15  Yes Collene Gobble,  MD  allopurinol (ZYLOPRIM) 100 MG tablet Take 100 mg by mouth daily.   Yes Historical Provider, MD  ALPRAZolam Duanne Moron) 0.5 MG tablet Take 1 tablet (0.5 mg total) by mouth 2 (two) times daily as needed for anxiety. 02/22/15  Yes Bonnielee Haff, MD  budesonide-formoterol [facility]) 160-4.5 MCG/ACT inhaler Inhale 2 puffs into the lungs 2 (two) times daily. 05/03/15  Yes Collene Gobble, MD  glipiZIDE (GLUCOTROL) 10 MG tablet Take 10 mg by mouth 2 (two) times daily before a meal.    Yes Historical Provider, MD  HYDROcodone-acetaminophen (NORCO) 7.5-325 MG per tablet Take 1 tablet by mouth every 4 (four) hours as needed for moderate pain or severe pain. Patient taking differently: Take 1 tablet by mouth every 8 (eight) hours as needed for moderate pain or severe pain.  04/01/15  Yes Thurnell Lose, MD  ibuprofen (ADVIL,MOTRIN) 800 MG tablet Take 800 mg by mouth every 8 (eight) hours as needed for moderate pain.    Yes Historical Provider, MD  ipratropium (ATROVENT) 0.02 % nebulizer solution USE 1 VIAL IN NEBULIZER EVERY 6 HOURS AS NEEDED FOR WHEEZING OR SHORTNESS OF BREATH. 10/24/15  Yes Collene Gobble, MD  Linaclotide (LINZESS) 145 MCG CAPS capsule Take 145 mcg by mouth daily as needed (CONSTIPATION).    Yes Historical Provider, MD  loratadine (CLARITIN) 10 MG tablet Take 10 mg by mouth daily.   Yes Historical Provider, MD  nystatin cream (MYCOSTATIN) Apply 1 application topically 2 (two) times daily. Yeast infection   Yes Historical Provider, MD  Olopatadine HCl (PAZEO) 0.7 % SOLN Apply 1 drop to eye daily.   Yes Historical Provider, MD  pioglitazone (ACTOS) 30 MG tablet Take 30 mg by mouth daily. 12/29/15  Yes Historical Provider, MD  pravastatin (PRAVACHOL) 20 MG tablet Take 20 mg by mouth daily.   Yes Historical Provider, MD  pregabalin (LYRICA) 75 MG capsule Take 75 mg by mouth 2 (two) times daily.   Yes Historical Provider, MD  PROAIR HFA 108 (90 Base) MCG/ACT inhaler INHALE 2 PUFFS BY MOUTH EVERY 4  HOURS AS NEEDED FOR WHEEZING 12/19/15  Yes Collene Gobble, MD  tiotropium (SPIRIVA) 18 MCG inhalation capsule Place 1 capsule (18 mcg total) into inhaler and inhale daily. 05/03/15  Yes Collene Gobble, MD  triamterene-hydrochlorothiazide (DYAZIDE) 37.5-25 MG per capsule Take 1 capsule by mouth daily.   Yes Historical Provider, MD  zolpidem (AMBIEN) 10 MG tablet Take 10 mg by mouth at bedtime.   Yes Historical Provider, MD  azithromycin (ZITHROMAX) 250 MG tablet Take  1 tablet (250 mg total) by mouth daily. 01/26/16 01/29/16  Lavina Hamman, MD  docusate sodium (COLACE) 100 MG capsule Take 2 capsules (200 mg total) by mouth daily. 01/26/16   Lavina Hamman, MD  esomeprazole (NEXIUM) 40 MG capsule Take 1 capsule (40 mg total) by mouth daily before breakfast. 01/26/16   Lavina Hamman, MD  polyethylene glycol (MIRALAX / Floria Raveling) packet Take 17 g by mouth daily. 01/26/16   Lavina Hamman, MD  predniSONE (DELTASONE) 10 MG tablet Take 30mg  daily for 3days,Take 20mg  daily for 3days,Take 10mg  daily for 3days, then stop. 01/26/16   Lavina Hamman, MD   BP 140/72 mmHg  Pulse 78  Temp(Src) 97.4 F (36.3 C) (Oral)  Resp 18  Ht 5' (1.524 m)  Wt 102.7 kg  BMI 44.22 kg/m2  SpO2 95%  LMP 06/04/2000 (LMP Unknown) Physical Exam  Constitutional: She is oriented to person, place, and time. She appears well-developed and well-nourished. No distress.  Morbidly obese female. Initial evaluation. Patient returning from the bathroom and at bedside is extremely winded, speaking in short, staccato sentences.  HENT:  Head: Normocephalic and atraumatic.  Eyes: Conjunctivae are normal. No scleral icterus.  Neck: Normal range of motion. No JVD present.  Cardiovascular: Normal rate, regular rhythm and normal heart sounds.  Exam reveals no gallop and no friction rub.   No murmur heard. Pulmonary/Chest: Breath sounds normal. No respiratory distress. She has no wheezes. She exhibits tenderness (central chest tenderness to  palpation).  Poor air movement, difficult to auscultate breath sounds.  Abdominal: Soft. Bowel sounds are normal. She exhibits no distension and no mass. There is no tenderness. There is no guarding.  Musculoskeletal: She exhibits no edema.  Neurological: She is alert and oriented to person, place, and time.  Skin: Skin is warm and dry. She is not diaphoretic.  Nursing note and vitals reviewed.   ED Course  Procedures (including critical care time) Labs Review Labs Reviewed  BASIC METABOLIC PANEL - Abnormal; Notable for the following:    Glucose, Bld 109 (*)    All other components within normal limits  CBC WITH DIFFERENTIAL/PLATELET - Abnormal; Notable for the following:    Hemoglobin 11.2 (*)    HCT 32.4 (*)    All other components within normal limits  GLUCOSE, CAPILLARY - Abnormal; Notable for the following:    Glucose-Capillary 206 (*)    All other components within normal limits  COMPREHENSIVE METABOLIC PANEL - Abnormal; Notable for the following:    Glucose, Bld 166 (*)    BUN 25 (*)    Creatinine, Ser 1.03 (*)    Total Protein 8.6 (*)    Alkaline Phosphatase 34 (*)    GFR calc non Af Amer 59 (*)    All other components within normal limits  CBC - Abnormal; Notable for the following:    Hemoglobin 11.0 (*)    HCT 32.8 (*)    All other components within normal limits  GLUCOSE, CAPILLARY - Abnormal; Notable for the following:    Glucose-Capillary 137 (*)    All other components within normal limits  GLUCOSE, CAPILLARY - Abnormal; Notable for the following:    Glucose-Capillary 105 (*)    All other components within normal limits  GLUCOSE, CAPILLARY - Abnormal; Notable for the following:    Glucose-Capillary 121 (*)    All other components within normal limits  GLUCOSE, CAPILLARY - Abnormal; Notable for the following:    Glucose-Capillary 114 (*)  All other components within normal limits  GLUCOSE, CAPILLARY - Abnormal; Notable for the following:     Glucose-Capillary 118 (*)    All other components within normal limits  GLUCOSE, CAPILLARY - Abnormal; Notable for the following:    Glucose-Capillary 110 (*)    All other components within normal limits  GLUCOSE, CAPILLARY - Abnormal; Notable for the following:    Glucose-Capillary 141 (*)    All other components within normal limits  GLUCOSE, CAPILLARY - Abnormal; Notable for the following:    Glucose-Capillary 120 (*)    All other components within normal limits  GLUCOSE, CAPILLARY - Abnormal; Notable for the following:    Glucose-Capillary 100 (*)    All other components within normal limits  GLUCOSE, CAPILLARY - Abnormal; Notable for the following:    Glucose-Capillary 204 (*)    All other components within normal limits  GLUCOSE, CAPILLARY - Abnormal; Notable for the following:    Glucose-Capillary 134 (*)    All other components within normal limits  BRAIN NATRIURETIC PEPTIDE  TROPONIN I  TROPONIN I  I-STAT TROPOININ, ED    Imaging Review No results found. I have personally reviewed and evaluated these images and lab results as part of my medical decision-making.   EKG Interpretation   Date/Time:  Monday January 23 2016 12:04:03 EDT Ventricular Rate:  81 PR Interval:  157 QRS Duration: 79 QT Interval:  400 QTC Calculation: 464 R Axis:   61 Text Interpretation:  Sinus rhythm Low voltage, precordial leads Confirmed  by COOK  MD, BRIAN ([id number]) on 01/23/2016 3:39:34 PM      MDM   Final diagnoses:  COPD exacerbation (HCC)      8:57 AM BP 140/72 mmHg  Pulse 78  Temp(Src) 97.4 F (36.3 C) (Oral)  Resp 18  Ht 5' (1.524 m)  Wt 102.7 kg  BMI 44.22 kg/m2  SpO2 95%  LMP 06/04/2000 (LMP Unknown) Patient c/o severe cramping after lasix and albuterol. Will give PO potassium 40 meq and ativan    8:57 AM BP 140/72 mmHg  Pulse 78  Temp(Src) 97.4 F (36.3 C) (Oral)  Resp 18  Ht 5' (1.524 m)  Wt 102.7 kg  BMI 44.22 kg/m2  SpO2 95%  LMP 06/04/2000 (LMP  Unknown) Patient states that she gets very bad foot cramps every night. She does not feel improved after lasix and CAT.  Feel this is likely a COPD exacerbation  Patient with little improvement after 1 treatment. Patient will need admission for further treatment.  Margarita Mail, PA-C 01/27/16 [id number]  Nat Christen, MD 01/28/16 [phone]

## 2016-01-23 NOTE — ED Notes (Signed)
PA at bedside.

## 2016-01-23 NOTE — ED Notes (Signed)
Friday began having back pain and SOB, worsening over the weekend. States she was wheezing this morning and took a breathing trx with improvement. No wheezing or crackles auscultated in lungs in triage. C/o facial/sinus pain and a headache in triage. On 2L O2 at home at all times.

## 2016-01-24 ENCOUNTER — Ambulatory Visit: Payer: Medicaid Other

## 2016-01-24 DIAGNOSIS — J441 Chronic obstructive pulmonary disease with (acute) exacerbation: Secondary | ICD-10-CM | POA: Diagnosis not present

## 2016-01-24 DIAGNOSIS — J9621 Acute and chronic respiratory failure with hypoxia: Secondary | ICD-10-CM | POA: Diagnosis not present

## 2016-01-24 DIAGNOSIS — I10 Essential (primary) hypertension: Secondary | ICD-10-CM | POA: Diagnosis not present

## 2016-01-24 LAB — CBC
HEMATOCRIT: 32.8 % — AB (ref 36.0–46.0)
Hemoglobin: 11 g/dL — ABNORMAL LOW (ref 12.0–15.0)
MCH: 27.6 pg (ref 26.0–34.0)
MCHC: 33.5 g/dL (ref 30.0–36.0)
MCV: 82.2 fL (ref 78.0–100.0)
Platelets: 251 10*3/uL (ref 150–400)
RBC: 3.99 MIL/uL (ref 3.87–5.11)
RDW: 15 % (ref 11.5–15.5)
WBC: 8.7 10*3/uL (ref 4.0–10.5)

## 2016-01-24 LAB — GLUCOSE, CAPILLARY
GLUCOSE-CAPILLARY: 105 mg/dL — AB (ref 65–99)
GLUCOSE-CAPILLARY: 121 mg/dL — AB (ref 65–99)
GLUCOSE-CAPILLARY: 114 mg/dL — AB (ref 65–99)
Glucose-Capillary: 137 mg/dL — ABNORMAL HIGH (ref 65–99)

## 2016-01-24 LAB — COMPREHENSIVE METABOLIC PANEL
ALT: 37 U/L (ref 14–54)
AST: 34 U/L (ref 15–41)
Albumin: 4.2 g/dL (ref 3.5–5.0)
Alkaline Phosphatase: 34 U/L — ABNORMAL LOW (ref 38–126)
Anion gap: 14 (ref 5–15)
BUN: 25 mg/dL — AB (ref 6–20)
CHLORIDE: 103 mmol/L (ref 101–111)
CO2: 26 mmol/L (ref 22–32)
Calcium: 10 mg/dL (ref 8.9–10.3)
Creatinine, Ser: 1.03 mg/dL — ABNORMAL HIGH (ref 0.44–1.00)
GFR calc Af Amer: >60 mL/min (ref >60)
GFR, EST NON AFRICAN AMERICAN: 59 mL/min — AB (ref >60)
Glucose, Bld: 166 mg/dL — ABNORMAL HIGH (ref 65–99)
POTASSIUM: 4.2 mmol/L (ref 3.5–5.1)
SODIUM: 143 mmol/L (ref 135–145)
Total Bilirubin: 0.5 mg/dL (ref 0.3–1.2)
Total Protein: 8.6 g/dL — ABNORMAL HIGH (ref 6.5–8.1)

## 2016-01-24 LAB — TROPONIN I
Troponin I: <0.03 ng/mL (ref <0.031)
Troponin I: <0.03 ng/mL (ref <0.031)

## 2016-01-24 MED ORDER — ALBUTEROL SULFATE (2.5 MG/3ML) 0.083% IN NEBU
2.5000 mg | INHALATION_SOLUTION | Freq: Three times a day (TID) | RESPIRATORY_TRACT | Status: DC
  Administered 2016-01-24 – 2016-01-26 (×6): 2.5 mg via RESPIRATORY_TRACT
  Filled 2016-01-24 (×6): qty 3

## 2016-01-24 MED ORDER — CETYLPYRIDINIUM CHLORIDE 0.05 % MT LIQD
7.0000 mL | Freq: Two times a day (BID) | OROMUCOSAL | Status: DC
  Administered 2016-01-24: 7 mL via OROMUCOSAL

## 2016-01-24 MED ORDER — CHLORHEXIDINE GLUCONATE 0.12 % MT SOLN
15.0000 mL | Freq: Two times a day (BID) | OROMUCOSAL | Status: DC
  Administered 2016-01-24 – 2016-01-26 (×6): 15 mL via OROMUCOSAL
  Filled 2016-01-24 (×7): qty 15

## 2016-01-24 NOTE — Progress Notes (Signed)
PROGRESS NOTE    Stephanie Schultz  [id number]  DOB: 11-27-1955  DOA: 01/23/2016 PCP: Barbette Merino, MD Outpatient Specialists: Pulmonology: Dr. Waynard Reeds course: 60 year old female patient with history of COPD, chronic respiratory failure on home oxygen 2.5 L/m, OSA on nightly CPAP, former smoker, DM 2, HTN, morbid obesity, GERD, HLD, presented to ED with to 4 days history of progressively worsening dyspnea, wheezing and chest congestion with increased purulent sputum. She was admitted for management of COPD exacerbation.  Assessment & Plan:   COPD exacerbation - Likely triggered by URTI. Low index of suspicion for PE, ACS or CHF - Treating with oxygen, bronchodilator nebulizations, short course of oral prednisone and azithromycin.  - Clinically improving.  Acute on chronic hypoxic respiratory failure  - Acute respiratory failure precipitated by COPD exacerbation. Chronic respiratory failure secondary to COPD, OSA and possible OHS.  - Management as above and titrate oxygen as tolerated to prior home level.   OSA on nightly CPAP - Patient states that she cannot tolerate the facemask in the hospital and prefers to use home device but no one to bring it into the hospital because of her daughter being out of town.  DM 2 - A1c in January: 8.3 - Hold oral hypoglycemic while hospitalized. Treat with sliding scale insulin.  Essential hypertension - Reasonable inpatient control. Continue Dyazide.  Chronic anemia - Stable.    DVT prophylaxis: Lovenox  Code Status: Full Family Communication: None at bedside Disposition Plan: DC home possibly 3/15.   Consultants:  None   Procedures:  None   Antimicrobials:  Azithromycin 3/14 >  Subjective: Feels better compared to initial arrival to ED. Dyspnea improved but still has some with exertion. No wheezing or chest pain. Has chronic cough.  Objective: Filed Vitals:   01/23/16 2230 01/24/16 0221 01/24/16 0545  01/24/16 0845  BP: 146/71  138/81   Pulse: 95  91   Temp: 98.2 F (36.8 C)  97.9 F (36.6 C)   TempSrc: Oral  Oral   Resp: 22  20   Height: 5' (1.524 m)     Weight: 103.828 kg (228 lb 14.4 oz)     SpO2: 95% 93% 97% 94%   No intake or output data in the 24 hours ending 01/24/16 1155 Filed Weights   01/23/16 2230  Weight: 103.828 kg (228 lb 14.4 oz)    Exam:  General exam: Moderately built and obese female patient sitting comfortably at edge of bed this morning. Respiratory system: Fair breath sounds bilaterally. Occasional bilateral expiratory rhonchi. No increased work of breathing. Cardiovascular system: S1 & S2 heard, RRR. No JVD, murmurs, gallops, clicks or pedal edema. Gastrointestinal system: Abdomen is nondistended, soft and nontender. Normal bowel sounds heard. Central nervous system: Alert and oriented. No focal neurological deficits. Extremities: Symmetric 5 x 5 power.   Data Reviewed: Basic Metabolic Panel:  Recent Labs Lab 01/23/16 1535 01/24/16 0531  NA 141 143  K 3.8 4.2  CL 102 103  CO2 29 26  GLUCOSE 109* 166*  BUN 20 25*  CREATININE 0.99 1.03*  CALCIUM 9.8 10.0   Liver Function Tests:  Recent Labs Lab 01/24/16 0531  AST 34  ALT 37  ALKPHOS 34*  BILITOT 0.5  PROT 8.6*  ALBUMIN 4.2   No results for input(s): LIPASE, AMYLASE in the last 168 hours. No results for input(s): AMMONIA in the last 168 hours. CBC:  Recent Labs Lab 01/23/16 1535 01/24/16 0531  WBC 6.2 8.7  NEUTROABS 3.9  --  HGB 11.2* 11.0*  HCT 32.4* 32.8*  MCV 79.2 82.2  PLT 239 251   Cardiac Enzymes:  Recent Labs Lab 01/24/16 0531 01/24/16 1036  TROPONINI <0.03 <0.03   BNP (last 3 results) No results for input(s): PROBNP in the last 8760 hours. CBG:  Recent Labs Lab 01/23/16 2243 01/24/16 0745  GLUCAP 206* 137*    No results found for this or any previous visit (from the past 240 hour(s)).       Studies: Dg Chest 2 View  01/23/2016  CLINICAL  DATA:  Shortness of breath for 3 days, on oxygen 3 years, history COPD/emphysema, hypertension, GERD, diabetes mellitus, pneumonia, cervical cancer, former smoker EXAM: CHEST  2 VIEW COMPARISON:  12/14/2015 FINDINGS: Enlargement of cardiac silhouette with pulmonary vascular congestion. Atherosclerotic calcification aorta. Subsegmental atelectasis RIGHT base persists. Lungs otherwise clear. No pleural effusion or pneumothorax. Question diffuse idiopathic skeletal hyperostosis. IMPRESSION: Enlargement of cardiac silhouette with pulmonary vascular congestion. Subsegmental atelectasis RIGHT base. Electronically Signed   By: Lavonia Dana M.D.   On: 01/23/2016 13:16        Scheduled Meds: . albuterol  2.5 mg Nebulization TID  . allopurinol  100 mg Oral Daily  . antiseptic oral rinse  7 mL Mouth Rinse q12n4p  . [START ON 01/25/2016] azithromycin  250 mg Oral Daily  . chlorhexidine  15 mL Mouth Rinse BID  . enoxaparin (LOVENOX) injection  40 mg Subcutaneous QHS  . insulin aspart  0-15 Units Subcutaneous TID WC  . insulin aspart  0-5 Units Subcutaneous QHS  . loratadine  10 mg Oral Daily  . pantoprazole  80 mg Oral Q1200  . pravastatin  20 mg Oral Daily  . predniSONE  40 mg Oral Q breakfast  . pregabalin  75 mg Oral BID  . tiotropium  18 mcg Inhalation Daily  . triamterene-hydrochlorothiazide  1 tablet Oral Daily   Continuous Infusions:   Principal Problem:   COPD exacerbation (HCC) Active Problems:   Diabetes mellitus type 2, controlled ([place])   Hypertension   Obstructive sleep apnea   Morbid obesity ([place])   Chronic respiratory failure ([place])    Time spent: 30 minutes.    Vernell Leep, MD, FACP, FHM. [facility] Pager [phone]  If 7PM-7AM, please contact night-coverage [email or web address] Password [facility] 01/24/2016, 11:55 AM

## 2016-01-24 NOTE — Progress Notes (Signed)
Pt refused CPAP qhs.  Pt states she can only tolerate nasal pillows and will therefore have her daughter bring her CPAP from home.  RT will continue to monitor as needed.

## 2016-01-25 DIAGNOSIS — I10 Essential (primary) hypertension: Secondary | ICD-10-CM

## 2016-01-25 DIAGNOSIS — J441 Chronic obstructive pulmonary disease with (acute) exacerbation: Secondary | ICD-10-CM

## 2016-01-25 DIAGNOSIS — E119 Type 2 diabetes mellitus without complications: Secondary | ICD-10-CM | POA: Diagnosis not present

## 2016-01-25 DIAGNOSIS — G4733 Obstructive sleep apnea (adult) (pediatric): Secondary | ICD-10-CM

## 2016-01-25 DIAGNOSIS — J9611 Chronic respiratory failure with hypoxia: Secondary | ICD-10-CM | POA: Diagnosis not present

## 2016-01-25 LAB — GLUCOSE, CAPILLARY
GLUCOSE-CAPILLARY: 110 mg/dL — AB (ref 65–99)
GLUCOSE-CAPILLARY: 120 mg/dL — AB (ref 65–99)
Glucose-Capillary: 118 mg/dL — ABNORMAL HIGH (ref 65–99)
Glucose-Capillary: 141 mg/dL — ABNORMAL HIGH (ref 65–99)

## 2016-01-25 MED ORDER — ZOLPIDEM TARTRATE 5 MG PO TABS
5.0000 mg | ORAL_TABLET | Freq: Every evening | ORAL | Status: DC | PRN
  Administered 2016-01-25: 5 mg via ORAL
  Filled 2016-01-25: qty 1

## 2016-01-25 MED ORDER — DOCUSATE SODIUM 100 MG PO CAPS
200.0000 mg | ORAL_CAPSULE | Freq: Every day | ORAL | Status: DC
  Administered 2016-01-25 – 2016-01-26 (×2): 200 mg via ORAL
  Filled 2016-01-25 (×2): qty 2

## 2016-01-25 MED ORDER — ZOLPIDEM TARTRATE 5 MG PO TABS
5.0000 mg | ORAL_TABLET | Freq: Once | ORAL | Status: AC
  Administered 2016-01-25: 5 mg via ORAL
  Filled 2016-01-25: qty 1

## 2016-01-25 MED ORDER — POLYETHYLENE GLYCOL 3350 17 G PO PACK
17.0000 g | PACK | Freq: Every day | ORAL | Status: DC
  Administered 2016-01-25 – 2016-01-26 (×2): 17 g via ORAL
  Filled 2016-01-25 (×2): qty 1

## 2016-01-25 NOTE — Progress Notes (Signed)
Patient oxygen level at rest was 94 % on 2.5 liters of oxygen via nasal cannula. Ambulated patient in hallway 124 ft on 2.5 liters of oxygen-- oxygen saturation dropped to 84%,  increase oxygen to 3L oxygen level came up to 92%. Returned to room decreased oxygen back to 2.5 liters saturation 94%  Patient tolerated well.

## 2016-01-25 NOTE — Progress Notes (Signed)
[facility] Progress Note  Patient: Stephanie Schultz [id number]   PCP: Barbette Merino, MD DOB: 1956-03-27   DOA: 01/23/2016   DOS: 01/25/2016   Date of Service: the patient was seen and examined on 01/25/2016  Subjective: pt has been having mild shortness of breath, occassional cough, no fever or chills. Nutrition: tolerating oral diet  Brief hospital course: Patient was admitted on 01/23/2016, with complaint of shortness of breath and cough, was found to have COPD exacerbation. Currently further plan is continue nebulization overnight.  Assessment and Plan: 1. COPD exacerbation (HCC)  Chronic resp failure  On oral prednisone, azithromycin No ongoing fever or chills. Will continue nebulization scheduled at present and monitor Check PT.  2.  Diabetes mellitus type 2, controlled ([place]) Sugars well controlled.  3  Hypertension Blood pressure well controlled  4  Obstructive sleep apnea Using CPAP overnight  Activity: physical therapy tomorrow Bowel regimen: last BM 01/23/16 DVT Prophylaxis: subcutaneous Heparin Nutrition: cardiac diet Advance goals of care discussion: full code  HPI: As per the H and P dictated on admission, "Stephanie Schultz is a 60 y.o. female with a past medical history significant for COPD on home O2, allergic rhinitis, OSA, DM2, and HTN who presents with worsening shortness of breath.  The patient was at her baseline until about 4 days ago when she started to develop worsening shortness of breath with exertion. At baseline she is able to clean her home and walk from one room to the other without dyspnea, but uses a cart at [employer]. However over the last 3 days she has been short of breath with any exertion, wheezing, and with chest congestion. There is been no chest pain, no chest pressure, no fever. She has had an increase in purulent sputum, as well as sinus pain and headache. No sick contacts.  In the ED, she was afebrile, tachypneic, and required  supplemental oxygen at her baseline. A chest x-ray showed no new opacity. Cr 1.0, WBC 6.2, hemoglobin 11.2, BNP normal, TNI negative. She was administered furosemide, DuoNeb, and Solu-Medrol, and TRH were asked to evaluate for admission." Procedures: none Consultants: none Antibiotics: Anti-infectives    Start     Dose/Rate Route Frequency Ordered Stop   01/25/16 1000  azithromycin (ZITHROMAX) tablet 250 mg     250 mg Oral Daily 01/23/16 2248 01/29/16 0959   01/24/16 1000  azithromycin (ZITHROMAX) tablet 500 mg     500 mg Oral Daily 01/23/16 2248 01/24/16 0952      Family Communication: no family was present at bedside, at the time of interview.   Disposition:  Expected discharge date:01/26/2016 Barriers to safe discharge: oxygenation on exertion    Intake/Output Summary (Last 24 hours) at 01/25/16 1744 Last data filed at 01/25/16 1729  Gross per 24 hour  Intake    600 ml  Output      0 ml  Net    600 ml   Filed Weights   01/23/16 2230 01/25/16 0705  Weight: 103.828 kg (228 lb 14.4 oz) 102.7 kg (226 lb 6.6 oz)    Objective: Physical Exam: Filed Vitals:   01/25/16 0525 01/25/16 0705 01/25/16 1043 01/25/16 1407  BP: 124/84   130/64  Pulse: 76  80 78  Temp: 97.9 F (36.6 C)   97.7 F (36.5 C)  TempSrc: Oral   Oral  Resp: 20  20 18   Height:      Weight:  102.7 kg (226 lb 6.6 oz)    SpO2: 97%   97%  General: Appear in mild distress, no Rash; Oral Mucosa moist. Cardiovascular: S1 and S2 Present, no Murmur, no JVD Respiratory: Bilateral Air entry present and no Crackles, ocassional  wheezes Abdomen: Bowel Sound present, Soft and no tenderness Extremities: no Pedal edema, no calf tenderness Neurology: Grossly no focal neuro deficit.  Data Reviewed: CBC:  Recent Labs Lab 01/23/16 1535 01/24/16 0531  WBC 6.2 8.7  NEUTROABS 3.9  --   HGB 11.2* 11.0*  HCT 32.4* 32.8*  MCV 79.2 82.2  PLT 239 [id number]   Basic Metabolic Panel:  Recent Labs Lab 01/23/16 1535  01/24/16 0531  NA 141 143  K 3.8 4.2  CL 102 103  CO2 29 26  GLUCOSE 109* 166*  BUN 20 25*  CREATININE 0.99 1.03*  CALCIUM 9.8 10.0   Liver Function Tests:  Recent Labs Lab 01/24/16 0531  AST 34  ALT 37  ALKPHOS 34*  BILITOT 0.5  PROT 8.6*  ALBUMIN 4.2   No results for input(s): LIPASE, AMYLASE in the last 168 hours. No results for input(s): AMMONIA in the last 168 hours.  Cardiac Enzymes:  Recent Labs Lab 01/24/16 0531 01/24/16 1036  TROPONINI <0.03 <0.03    BNP (last 3 results)  Recent Labs  03/06/15 1510 03/31/15 1231 01/23/16 1535  BNP 4.9 4.1 16.1    CBG:  Recent Labs Lab 01/24/16 1652 01/24/16 2154 01/25/16 0722 01/25/16 1136 01/25/16 1633  GLUCAP 121* 114* 118* 110* 141*    No results found for this or any previous visit (from the past 240 hour(s)).   Studies: No results found.   Scheduled Meds: . albuterol  2.5 mg Nebulization TID  . allopurinol  100 mg Oral Daily  . azithromycin  250 mg Oral Daily  . chlorhexidine  15 mL Mouth Rinse BID  . enoxaparin (LOVENOX) injection  40 mg Subcutaneous QHS  . insulin aspart  0-15 Units Subcutaneous TID WC  . insulin aspart  0-5 Units Subcutaneous QHS  . loratadine  10 mg Oral Daily  . pantoprazole  80 mg Oral Q1200  . pravastatin  20 mg Oral Daily  . predniSONE  40 mg Oral Q breakfast  . pregabalin  75 mg Oral BID  . tiotropium  18 mcg Inhalation Daily  . triamterene-hydrochlorothiazide  1 tablet Oral Daily   Continuous Infusions:  PRN Meds: albuterol, HYDROcodone-acetaminophen, Linaclotide  Time spent: 30 minutes  Author: Berle Mull, MD [facility] Pager: [phone] 01/25/2016 5:44 PM  If 7PM-7AM, please contact night-coverage at [email or web address], password [facility]

## 2016-01-26 ENCOUNTER — Telehealth: Payer: Self-pay | Admitting: *Deleted

## 2016-01-26 DIAGNOSIS — J9611 Chronic respiratory failure with hypoxia: Secondary | ICD-10-CM | POA: Diagnosis not present

## 2016-01-26 DIAGNOSIS — E119 Type 2 diabetes mellitus without complications: Secondary | ICD-10-CM | POA: Diagnosis not present

## 2016-01-26 DIAGNOSIS — I10 Essential (primary) hypertension: Secondary | ICD-10-CM | POA: Diagnosis not present

## 2016-01-26 DIAGNOSIS — J441 Chronic obstructive pulmonary disease with (acute) exacerbation: Secondary | ICD-10-CM | POA: Diagnosis not present

## 2016-01-26 LAB — GLUCOSE, CAPILLARY
GLUCOSE-CAPILLARY: 204 mg/dL — AB (ref 65–99)
GLUCOSE-CAPILLARY: 134 mg/dL — AB (ref 65–99)
Glucose-Capillary: 100 mg/dL — ABNORMAL HIGH (ref 65–99)

## 2016-01-26 MED ORDER — POLYETHYLENE GLYCOL 3350 17 G PO PACK: 17.0000 g | PACK | Freq: Every day | ORAL | Status: DC

## 2016-01-26 MED ORDER — DOCUSATE SODIUM 100 MG PO CAPS: 200.0000 mg | ORAL_CAPSULE | Freq: Every day | ORAL | Status: DC

## 2016-01-26 MED ORDER — PREDNISONE 10 MG PO TABS: ORAL_TABLET | ORAL | Status: DC

## 2016-01-26 MED ORDER — ESOMEPRAZOLE MAGNESIUM 40 MG PO CPDR
40.0000 mg | DELAYED_RELEASE_CAPSULE | Freq: Every day | ORAL | Status: DC
Start: 2016-01-26 — End: 2016-03-24

## 2016-01-26 MED ORDER — AZITHROMYCIN 250 MG PO TABS: 250.0000 mg | ORAL_TABLET | Freq: Every day | ORAL | Status: AC

## 2016-01-26 NOTE — Discharge Summary (Signed)
[facility] Discharge Summary   Patient: Stephanie Schultz [id number]   PCP: Barbette Merino, MD DOB: January 17, 1956   Date of admission: 01/23/2016   Date of discharge:  01/26/2016    Discharge Diagnoses:  Principal Problem:   COPD exacerbation ([place]) Active Problems:   Diabetes mellitus type 2, controlled ([place])   Hypertension   Obstructive sleep apnea   Morbid obesity ([place])   Chronic respiratory failure ([place])   Recommendations for Outpatient Follow-up:  1. Follow-up with PCP in one week   Follow-up Information    Follow up with [facility], MD. Schedule an appointment as soon as possible for a visit in 1 week.   Specialty:  Internal Medicine   Contact information:   [address] [place] [address] [phone]      Diet recommendation: Carb modified and heart healthy diet  Activity: The patient is advised to gradually reintroduce usual activities.  Discharge Condition: good  History of present illness: As per the H and P dictated on admission, "Stephanie Schultz is a 60 y.o. female with a past medical history significant for COPD on home O2, allergic rhinitis, OSA, DM2, and HTN who presents with worsening shortness of breath.  The patient was at her baseline until about 4 days ago when she started to develop worsening shortness of breath with exertion. At baseline she is able to clean her home and walk from one room to the other without dyspnea, but uses a cart at [employer]. However over the last 3 days she has been short of breath with any exertion, wheezing, and with chest congestion. There is been no chest pain, no chest pressure, no fever. She has had an increase in purulent sputum, as well as sinus pain and headache. No sick contacts.  In the ED, she was afebrile, tachypneic, and required supplemental oxygen at her baseline. A chest x-ray showed no new opacity. Cr 1.0, WBC 6.2, hemoglobin 11.2, BNP normal, TNI negative. She was administered furosemide, DuoNeb, and  Solu-Medrol, and TRH were asked to evaluate for admission."  Hospital Course:  Summary of her active problems in the hospital is as following. 1. COPD exacerbation (HCC)  Acute on Chronic resp failure  On oral prednisone, azithromycin No ongoing fever or chills. Initially the patient was requiring increase oxygen, the day of the discharge the patient's oxygenation remained stable both on exertion as well as on rest on 2.5 L. Case manager was consulted to ensure adequate supplies.  2. Diabetes mellitus type 2, controlled ([place]) Sugars well controlled. Will resume home medication.  3 Hypertension Blood pressure well controlled Continue home medication.  4 Obstructive sleep apnea Using CPAP overnight, good compliance  Activity: Walking in the hallway without any combination. Bowel regimen: last BM 01/23/16, will discharge on stool softeners DVT Prophylaxis: Patient was provided subcutaneous Heparin during the hospitalization  All other chronic medical condition were stable during the hospitalization.  Patient was ambulatory without any assistance. Patient seen by physical therapy, who recommended no physical therapy follow-up On the day of the discharge the patient's oxygenation remained stable on both rest as well as on exertion, and no other acute medical condition were reported by patient. the patient was felt safe to be discharge at home with family support.  Procedures and Results:  None   Consultations:  None  DISCHARGE MEDICATION: Current Discharge Medication List    START taking these medications   Details  azithromycin (ZITHROMAX) 250 MG tablet Take 1 tablet (250 mg total) by mouth daily. Qty: 3 each,  Refills: 0    docusate sodium (COLACE) 100 MG capsule Take 2 capsules (200 mg total) by mouth daily. Qty: 10 capsule, Refills: 0    polyethylene glycol (MIRALAX / GLYCOLAX) packet Take 17 g by mouth daily. Qty: 14 each, Refills: 0    predniSONE (DELTASONE)  10 MG tablet Take 30mg  daily for 3days,Take 20mg  daily for 3days,Take 10mg  daily for 3days, then stop. Qty: 18 tablet, Refills: 0      CONTINUE these medications which have CHANGED   Details  esomeprazole (NEXIUM) 40 MG capsule Take 1 capsule (40 mg total) by mouth daily before breakfast. Qty: 30 capsule, Refills: 0      CONTINUE these medications which have NOT CHANGED   Details  albuterol (PROVENTIL) (2.5 MG/3ML) 0.083% nebulizer solution Take 3 mLs (2.5 mg total) by nebulization 4 (four) times daily. DX J44.9 Qty: 360 mL, Refills: 2    allopurinol (ZYLOPRIM) 100 MG tablet Take 100 mg by mouth daily.    ALPRAZolam (XANAX) 0.5 MG tablet Take 1 tablet (0.5 mg total) by mouth 2 (two) times daily as needed for anxiety. Qty: 10 tablet, Refills: 0    budesonide-formoterol (SYMBICORT) 160-4.5 MCG/ACT inhaler Inhale 2 puffs into the lungs 2 (two) times daily. Qty: 1 Inhaler, Refills: 6    glipiZIDE (GLUCOTROL) 10 MG tablet Take 10 mg by mouth 2 (two) times daily before a meal.     HYDROcodone-acetaminophen (NORCO) 7.5-325 MG per tablet Take 1 tablet by mouth every 4 (four) hours as needed for moderate pain or severe pain. Qty: 10 tablet, Refills: 0    ibuprofen (ADVIL,MOTRIN) 800 MG tablet Take 800 mg by mouth every 8 (eight) hours as needed for moderate pain.     ipratropium (ATROVENT) 0.02 % nebulizer solution USE 1 VIAL IN NEBULIZER EVERY 6 HOURS AS NEEDED FOR WHEEZING OR SHORTNESS OF BREATH. Qty: 300 mL, Refills: 3    Linaclotide (LINZESS) 145 MCG CAPS capsule Take 145 mcg by mouth daily as needed (CONSTIPATION).     loratadine (CLARITIN) 10 MG tablet Take 10 mg by mouth daily.    nystatin cream (MYCOSTATIN) Apply 1 application topically 2 (two) times daily. Yeast infection    Olopatadine HCl (PAZEO) 0.7 % SOLN Apply 1 drop to eye daily.    pioglitazone (ACTOS) 30 MG tablet Take 30 mg by mouth daily. Refills: 0    pravastatin (PRAVACHOL) 20 MG tablet Take 20 mg by mouth  daily.    pregabalin (LYRICA) 75 MG capsule Take 75 mg by mouth 2 (two) times daily.    PROAIR HFA 108 (90 Base) MCG/ACT inhaler INHALE 2 PUFFS BY MOUTH EVERY 4 HOURS AS NEEDED FOR WHEEZING Qty: 8.5 g, Refills: 5    tiotropium (SPIRIVA) 18 MCG inhalation capsule Place 1 capsule (18 mcg total) into inhaler and inhale daily. Qty: 30 capsule, Refills: 6    triamterene-hydrochlorothiazide (DYAZIDE) 37.5-25 MG per capsule Take 1 capsule by mouth daily.    zolpidem (AMBIEN) 10 MG tablet Take 10 mg by mouth at bedtime.       Allergies  Allergen Reactions  . Acetaminophen Other (See Comments)    Pt was told that she could not take Tylenol, does not know why   Discharge Instructions    Diet - low sodium heart healthy    Complete by:  As directed      Diet Carb Modified    Complete by:  As directed      Discharge instructions    Complete by:  As  directed   It is important that you read following instructions as well as go over your medication list with RN to help you understand your care after this hospitalization.  Discharge Instructions: Please follow-up with PCP in one week  Please request your primary care physician to go over all Hospital Tests and Procedure/Radiological results at the follow up,  Please get all Hospital records sent to your PCP by signing hospital release before you go home.   Do not take more than prescribed Pain, Sleep and Anxiety Medications. You were cared for by a hospitalist during your hospital stay. If you have any questions about your discharge medications or the care you received while you were in the hospital after you are discharged, you can call the unit and ask to speak with the hospitalist on call if the hospitalist that took care of you is not available.  Once you are discharged, your primary care physician will handle any further medical issues. Please note that NO REFILLS for any discharge medications will be authorized once you are discharged, as  it is imperative that you return to your primary care physician (or establish a relationship with a primary care physician if you do not have one) for your aftercare needs so that they can reassess your need for medications and monitor your lab values. You Must read complete instructions/literature along with all the possible adverse reactions/side effects for all the Medicines you take and that have been prescribed to you. Take any new Medicines after you have completely understood and accept all the possible adverse reactions/side effects. Wear Seat belts while driving. If you have smoked or chewed Tobacco in the last 2 yrs please stop smoking and/or stop any Recreational drug use.     Increase activity slowly    Complete by:  As directed           Discharge Exam: Filed Weights   01/23/16 2230 01/25/16 0705  Weight: 103.828 kg (228 lb 14.4 oz) 102.7 kg (226 lb 6.6 oz)   Filed Vitals:   01/25/16 2245 01/26/16 0744  BP:    Pulse: 84 78  Temp:    Resp: 18 18   General: Appear in no distress, no Rash; Oral Mucosa moist. Cardiovascular: S1 and S2 Present, no Murmur, no JVD Respiratory: Bilateral Air entry present and Clear to Auscultation, no Crackles, no wheezes Abdomen: Bowel Sound present, Soft and no tenderness Extremities: no Pedal edema, no calf tenderness Neurology: Grossly no focal neuro deficit.  The results of significant diagnostics from this hospitalization (including imaging, microbiology, ancillary and laboratory) are listed below for reference.    Significant Diagnostic Studies: Dg Chest 2 View  01/23/2016  CLINICAL DATA:  Shortness of breath for 3 days, on oxygen 3 years, history COPD/emphysema, hypertension, GERD, diabetes mellitus, pneumonia, cervical cancer, former smoker EXAM: CHEST  2 VIEW COMPARISON:  12/14/2015 FINDINGS: Enlargement of cardiac silhouette with pulmonary vascular congestion. Atherosclerotic calcification aorta. Subsegmental atelectasis RIGHT base  persists. Lungs otherwise clear. No pleural effusion or pneumothorax. Question diffuse idiopathic skeletal hyperostosis. IMPRESSION: Enlargement of cardiac silhouette with pulmonary vascular congestion. Subsegmental atelectasis RIGHT base. Electronically Signed   By: Lavonia Dana M.D.   On: 01/23/2016 13:16    Microbiology: No results found for this or any previous visit (from the past 240 hour(s)).   Labs: CBC:  Recent Labs Lab 01/23/16 1535 01/24/16 0531  WBC 6.2 8.7  NEUTROABS 3.9  --   HGB 11.2* 11.0*  HCT 32.4* 32.8*  MCV  79.2 82.2  PLT 239 [id number]   Basic Metabolic Panel:  Recent Labs Lab 01/23/16 1535 01/24/16 0531  NA 141 143  K 3.8 4.2  CL 102 103  CO2 29 26  GLUCOSE 109* 166*  BUN 20 25*  CREATININE 0.99 1.03*  CALCIUM 9.8 10.0   Liver Function Tests:  Recent Labs Lab 01/24/16 0531  AST 34  ALT 37  ALKPHOS 34*  BILITOT 0.5  PROT 8.6*  ALBUMIN 4.2   No results for input(s): LIPASE, AMYLASE in the last 168 hours. No results for input(s): AMMONIA in the last 168 hours. Cardiac Enzymes:  Recent Labs Lab 01/24/16 0531 01/24/16 1036  TROPONINI <0.03 <0.03   BNP (last 3 results)  Recent Labs  03/06/15 1510 03/31/15 1231 01/23/16 1535  BNP 4.9 4.1 16.1   CBG:  Recent Labs Lab 01/25/16 1136 01/25/16 1633 01/25/16 2109 01/26/16 0803 01/26/16 1215  GLUCAP 110* 141* 120* 100* 204*   Time spent: 30 minutes  Signed:  Ulysse Siemen  [facility]  01/26/2016  , 5:13 PM

## 2016-01-26 NOTE — Progress Notes (Signed)
PT Cancellation Note  Patient Details Name: Stephanie Schultz MRN: [id number] DOB: 11-11-1956   Cancelled Treatment:    Reason Eval/Treat Not Completed: PT screened, no needs identified, will sign off   Weston Anna, MPT Pager: [phone]

## 2016-01-26 NOTE — Telephone Encounter (Signed)
Pt left 1st name, DOB, and phone.

## 2016-01-26 NOTE — Progress Notes (Signed)
Patient is on HOME Unit. RT set up for patient. Patient able to put on herself. 2.5L O2  Bleed in. RT will continue to monitor as needed.

## 2016-01-26 NOTE — Progress Notes (Signed)
Per nursing staff pt is at baseline on his 02, and she has an 02 tank to travel home with.  No CM needs noted or communicated. Marney Doctor, RN,BSN,NCM [phone]

## 2016-01-27 ENCOUNTER — Ambulatory Visit: Payer: Medicaid Other | Admitting: Podiatry

## 2016-01-31 ENCOUNTER — Ambulatory Visit: Payer: Medicaid Other

## 2016-02-01 ENCOUNTER — Telehealth: Payer: Self-pay | Admitting: Emergency Medicine

## 2016-02-01 DIAGNOSIS — G4733 Obstructive sleep apnea (adult) (pediatric): Secondary | ICD-10-CM

## 2016-02-01 NOTE — Telephone Encounter (Signed)
Called and spoke with pt. She states she was discharged from the hospital on 01/26/16 and due to this she has been unable to get her CPAP titration done. She states that she was told that nasal pillows would be called into [facility]. I explained to her that the order was not placed. She states that [facility] told her she did not have a current prescription.  I explained to her that I would send a order in for nasal pillows. She voiced understanding and had no further questions. Order has been placed for nasal pillows. Nothing further needed.

## 2016-02-07 ENCOUNTER — Ambulatory Visit: Payer: Medicaid Other

## 2016-02-09 ENCOUNTER — Other Ambulatory Visit: Payer: Self-pay | Admitting: Emergency Medicine

## 2016-02-21 ENCOUNTER — Ambulatory Visit: Payer: Medicaid Other

## 2016-02-28 ENCOUNTER — Ambulatory Visit: Payer: Medicaid Other

## 2016-03-22 ENCOUNTER — Encounter (HOSPITAL_COMMUNITY): Payer: Self-pay | Admitting: Emergency Medicine

## 2016-03-22 ENCOUNTER — Emergency Department (HOSPITAL_COMMUNITY): Payer: Medicaid Other

## 2016-03-22 ENCOUNTER — Inpatient Hospital Stay (HOSPITAL_COMMUNITY)
Admission: EM | Admit: 2016-03-22 | Discharge: 2016-03-24 | DRG: 190 | Disposition: A | Discharge status: Home Health | Payer: Medicaid Other | Attending: Internal Medicine | Admitting: Internal Medicine

## 2016-03-22 DIAGNOSIS — Z6841 Body Mass Index (BMI) 40.0 and over, adult: Secondary | ICD-10-CM

## 2016-03-22 DIAGNOSIS — K219 Gastro-esophageal reflux disease without esophagitis: Secondary | ICD-10-CM | POA: Diagnosis present

## 2016-03-22 DIAGNOSIS — F419 Anxiety disorder, unspecified: Secondary | ICD-10-CM | POA: Diagnosis present

## 2016-03-22 DIAGNOSIS — J9621 Acute and chronic respiratory failure with hypoxia: Secondary | ICD-10-CM | POA: Diagnosis present

## 2016-03-22 DIAGNOSIS — I110 Hypertensive heart disease with heart failure: Secondary | ICD-10-CM | POA: Diagnosis present

## 2016-03-22 DIAGNOSIS — I5032 Chronic diastolic (congestive) heart failure: Secondary | ICD-10-CM | POA: Diagnosis present

## 2016-03-22 DIAGNOSIS — D573 Sickle-cell trait: Secondary | ICD-10-CM | POA: Diagnosis present

## 2016-03-22 DIAGNOSIS — Z7984 Long term (current) use of oral hypoglycemic drugs: Secondary | ICD-10-CM

## 2016-03-22 DIAGNOSIS — J441 Chronic obstructive pulmonary disease with (acute) exacerbation: Secondary | ICD-10-CM | POA: Diagnosis present

## 2016-03-22 DIAGNOSIS — Z87891 Personal history of nicotine dependence: Secondary | ICD-10-CM

## 2016-03-22 DIAGNOSIS — E119 Type 2 diabetes mellitus without complications: Secondary | ICD-10-CM

## 2016-03-22 DIAGNOSIS — M109 Gout, unspecified: Secondary | ICD-10-CM | POA: Diagnosis present

## 2016-03-22 DIAGNOSIS — D649 Anemia, unspecified: Secondary | ICD-10-CM | POA: Diagnosis present

## 2016-03-22 DIAGNOSIS — G4733 Obstructive sleep apnea (adult) (pediatric): Secondary | ICD-10-CM | POA: Diagnosis present

## 2016-03-22 DIAGNOSIS — J9611 Chronic respiratory failure with hypoxia: Secondary | ICD-10-CM | POA: Diagnosis present

## 2016-03-22 DIAGNOSIS — K5900 Constipation, unspecified: Secondary | ICD-10-CM | POA: Diagnosis present

## 2016-03-22 DIAGNOSIS — E785 Hyperlipidemia, unspecified: Secondary | ICD-10-CM | POA: Diagnosis present

## 2016-03-22 DIAGNOSIS — G8929 Other chronic pain: Secondary | ICD-10-CM | POA: Diagnosis present

## 2016-03-22 DIAGNOSIS — E1142 Type 2 diabetes mellitus with diabetic polyneuropathy: Secondary | ICD-10-CM | POA: Diagnosis present

## 2016-03-22 LAB — BASIC METABOLIC PANEL
ANION GAP: 12 (ref 5–15)
BUN: 18 mg/dL (ref 6–20)
CALCIUM: 9.3 mg/dL (ref 8.9–10.3)
CHLORIDE: 106 mmol/L (ref 101–111)
CO2: 24 mmol/L (ref 22–32)
Creatinine, Ser: 1.16 mg/dL — ABNORMAL HIGH (ref 0.44–1.00)
GFR calc non Af Amer: 51 mL/min — ABNORMAL LOW (ref >60)
GFR, EST AFRICAN AMERICAN: 59 mL/min — AB (ref >60)
GLUCOSE: 129 mg/dL — AB (ref 65–99)
POTASSIUM: 3.6 mmol/L (ref 3.5–5.1)
Sodium: 142 mmol/L (ref 135–145)

## 2016-03-22 LAB — CBC WITH DIFFERENTIAL/PLATELET
BASOS ABS: 0 10*3/uL (ref 0.0–0.1)
BASOS PCT: 0 %
Eosinophils Absolute: 0.1 10*3/uL (ref 0.0–0.7)
Eosinophils Relative: 1 %
HEMATOCRIT: 32.4 % — AB (ref 36.0–46.0)
HEMOGLOBIN: 11.1 g/dL — AB (ref 12.0–15.0)
LYMPHS PCT: 16 %
Lymphs Abs: 1.4 10*3/uL (ref 0.7–4.0)
MCH: 27.5 pg (ref 26.0–34.0)
MCHC: 34.3 g/dL (ref 30.0–36.0)
MCV: 80.4 fL (ref 78.0–100.0)
Monocytes Absolute: 0.2 10*3/uL (ref 0.1–1.0)
Monocytes Relative: 3 %
NEUTROS ABS: 6.7 10*3/uL (ref 1.7–7.7)
NEUTROS PCT: 80 %
Platelets: 255 10*3/uL (ref 150–400)
RBC: 4.03 MIL/uL (ref 3.87–5.11)
RDW: 15.1 % (ref 11.5–15.5)
WBC: 8.4 10*3/uL (ref 4.0–10.5)

## 2016-03-22 MED ORDER — SODIUM CHLORIDE 0.9 % IV SOLN
INTRAVENOUS | Status: DC
  Administered 2016-03-22: 23:00:00 via INTRAVENOUS

## 2016-03-22 NOTE — ED Notes (Signed)
Bed: RESA Expected date:  Expected time:  Means of arrival:  Comments: EMS resp distress 

## 2016-03-22 NOTE — ED Notes (Signed)
[facility] presents with a 60 yo female from home in respiratory distress and SOB.  Pt states she has progressively been having difficulty breathing for the past week and thinks that it may be coming from the changes in weather.  She gets SOB especially with exertion.  She is on 2 liters home O2 and has been coughing up yellowish sputum with a tinge of blood on occasion.  [facility] gave 2 neb treatments/10 mg albuterol and 0.5 mg atrovent and 125 mg Solumedrol.  CBG 117.  Hx of COPD and emphysema, diabetes.

## 2016-03-22 NOTE — ED Provider Notes (Signed)
CSN: [id number]     Arrival date & time 03/22/16  2149 History   First MD Initiated Contact with Patient 03/22/16 2156     Chief Complaint  Patient presents with  . Respiratory Distress  . Shortness of Breath     (Consider location/radiation/quality/duration/timing/severity/associated sxs/prior Treatment) HPI Comments: Patient complaining of increased shortness of breath times several days. Has a history of COPD and is on home oxygen at 2 L it also has a home nebulizer. Office been productive of yellow sputum with occasional blood tinge. Denies any fever or chills. Has a history of emphysema and this is similar. Denies any anginal or CHF type symptoms. Called EMS and was given albuterol 10 mg along with Atrovent and 125 mg of Solu-Medrol. She was transported here in she feels better at this time.  Patient is a 60 y.o. female presenting with shortness of breath. The history is provided by the patient.  Shortness of Breath   Past Medical History  Diagnosis Date  . Hypertension   . Anxiety   . OSA on CPAP     CPAP at night   . Dizziness     pt believes this is motion sickness or vertigo  . GERD (gastroesophageal reflux disease)   . Headache(784.0)   . Cancer ([place]) 1990    cervical   . Anemia   . Morbid obesity ([place])   . Sickle cell trait ([place])   . Hx of cardiac catheterization     a. LHC at [facility] in [place], [place] 09/2008:  Normal coronary arteries EF 70%.  . Tobacco abuse     a. up to 3ppd from age 77 to 71, now 1/4 ppd (01/2013)  . Shortness of breath   . Pneumonia   . Diabetes mellitus without complication ([place])   . Emphysema   . COPD (chronic obstructive pulmonary disease) ([place])     dR . ROBERT BYOUM  . Hyperlipidemia   . Supplemental oxygen dependent     2L [place]  . Numbness     FINGER TIPS  . Arthritis   . Gout   . Gallstones    Past Surgical History  Procedure Laterality Date  . Tubal ligation    . Cardiac catheterization    . Colonoscopy  09/05/2012     Procedure: COLONOSCOPY;  Surgeon: Beryle Beams, MD;  Location: [facility]OSCOPY;  Service: Endoscopy;  Laterality: N/A;  . Colonoscopy with propofol N/A 09/02/2015    Procedure: COLONOSCOPY WITH PROPOFOL;  Surgeon: Carol Ada, MD;  Location: [facility]OSCOPY;  Service: Endoscopy;  Laterality: N/A;  . Cholecystectomy N/A 12/13/2015    Procedure: LAPAROSCOPIC CHOLECYSTECTOMY;  Surgeon: Ralene Ok, MD;  Location: [facility] ORS;  Service: General;  Laterality: N/A;   Family History  Problem Relation Age of Onset  . Lung cancer Paternal Aunt   . Lung cancer Paternal Grandfather   . Other Father     unaware of father's medical history  . Diabetes Mother     alive @ 67  . Myasthenia gravis Mother   . Other      multiple siblings a&w.   Social History  Substance Use Topics  . Smoking status: Former Smoker -- 0.50 packs/day for 36 years    Types: Cigarettes    Quit date: 11/11/2015  . Smokeless tobacco: Never Used     Comment: Approx 90 pk-yrs (up to 3ppd until ~ 2009). Smoking 3 cigs per day now.  . Alcohol Use: No   OB History  No data available     Review of Systems  Respiratory: Positive for shortness of breath.   All other systems reviewed and are negative.     Allergies  Acetaminophen  Home Medications   Prior to Admission medications   Medication Sig Start Date End Date Taking? Authorizing Provider  albuterol (PROVENTIL) (2.5 MG/3ML) 0.083% nebulizer solution Take 3 mLs (2.5 mg total) by nebulization 4 (four) times daily. DX J44.9 Patient taking differently: Take 2.5 mg by nebulization every 6 (six) hours as needed for wheezing or shortness of breath. DX J44.9 05/03/15   Collene Gobble, MD  allopurinol (ZYLOPRIM) 100 MG tablet Take 100 mg by mouth daily.    Historical Provider, MD  ALPRAZolam Duanne Moron) 0.5 MG tablet Take 1 tablet (0.5 mg total) by mouth 2 (two) times daily as needed for anxiety. 02/22/15   Bonnielee Haff, MD  budesonide-formoterol (SYMBICORT) 160-4.5 MCG/ACT  inhaler Inhale 2 puffs into the lungs 2 (two) times daily. 05/03/15   Collene Gobble, MD  docusate sodium (COLACE) 100 MG capsule Take 2 capsules (200 mg total) by mouth daily. 01/26/16   Lavina Hamman, MD  esomeprazole (NEXIUM) 40 MG capsule Take 1 capsule (40 mg total) by mouth daily before breakfast. 01/26/16   Lavina Hamman, MD  glipiZIDE (GLUCOTROL) 10 MG tablet Take 10 mg by mouth 2 (two) times daily before a meal.     Historical Provider, MD  HYDROcodone-acetaminophen (NORCO) 7.5-325 MG per tablet Take 1 tablet by mouth every 4 (four) hours as needed for moderate pain or severe pain. Patient taking differently: Take 1 tablet by mouth every 8 (eight) hours as needed for moderate pain or severe pain.  04/01/15   Thurnell Lose, MD  ibuprofen (ADVIL,MOTRIN) 800 MG tablet Take 800 mg by mouth every 8 (eight) hours as needed for moderate pain.     Historical Provider, MD  ipratropium (ATROVENT) 0.02 % nebulizer solution USE 1 VIAL IN NEBULIZER EVERY 6 HOURS AS NEEDED FOR WHEEZING OR SHORTNESS OF BREATH. 02/10/16   Collene Gobble, MD  Linaclotide [facility]) 145 MCG CAPS capsule Take 145 mcg by mouth daily as needed (CONSTIPATION).     Historical Provider, MD  loratadine (CLARITIN) 10 MG tablet Take 10 mg by mouth daily.    Historical Provider, MD  nystatin cream (MYCOSTATIN) Apply 1 application topically 2 (two) times daily. Yeast infection    Historical Provider, MD  Olopatadine HCl (PAZEO) 0.7 % SOLN Apply 1 drop to eye daily.    Historical Provider, MD  pioglitazone (ACTOS) 30 MG tablet Take 30 mg by mouth daily. 12/29/15   Historical Provider, MD  polyethylene glycol (MIRALAX / GLYCOLAX) packet Take 17 g by mouth daily. 01/26/16   Lavina Hamman, MD  pravastatin (PRAVACHOL) 20 MG tablet Take 20 mg by mouth daily.    Historical Provider, MD  predniSONE (DELTASONE) 10 MG tablet Take 30mg  daily for 3days,Take 20mg  daily for 3days,Take 10mg  daily for 3days, then stop. 01/26/16   Lavina Hamman, MD   pregabalin (LYRICA) 75 MG capsule Take 75 mg by mouth 2 (two) times daily.    Historical Provider, MD  PROAIR HFA 108 (90 Base) MCG/ACT inhaler INHALE 2 PUFFS BY MOUTH EVERY 4 HOURS AS NEEDED FOR WHEEZING 12/19/15   Collene Gobble, MD  tiotropium (SPIRIVA) 18 MCG inhalation capsule Place 1 capsule (18 mcg total) into inhaler and inhale daily. 05/03/15   Collene Gobble, MD  triamterene-hydrochlorothiazide (DYAZIDE) 37.5-25 MG per capsule  Take 1 capsule by mouth daily.    Historical Provider, MD  zolpidem (AMBIEN) 10 MG tablet Take 10 mg by mouth at bedtime.    Historical Provider, MD   BP 103/59 mmHg  Pulse 94  Temp(Src) 97.9 F (36.6 C) (Oral)  Resp 30  SpO2 97%  LMP 06/04/2000 (LMP Unknown) Physical Exam  Constitutional: She is oriented to person, place, and time. She appears well-developed and well-nourished.  Non-toxic appearance. No distress.  HENT:  Head: Normocephalic and atraumatic.  Eyes: Conjunctivae, EOM and lids are normal. Pupils are equal, round, and reactive to light.  Neck: Normal range of motion. Neck supple. No tracheal deviation present. No thyroid mass present.  Cardiovascular: Normal rate, regular rhythm and normal heart sounds.  Exam reveals no gallop.   No murmur heard. Pulmonary/Chest: No stridor. She is in respiratory distress. She has decreased breath sounds. She has wheezes. She has no rhonchi. She has no rales.  Abdominal: Soft. Normal appearance and bowel sounds are normal. She exhibits no distension. There is no tenderness. There is no rebound and no CVA tenderness.  Musculoskeletal: Normal range of motion. She exhibits no edema or tenderness.  Neurological: She is alert and oriented to person, place, and time. She has normal strength. No cranial nerve deficit or sensory deficit. GCS eye subscore is 4. GCS verbal subscore is 5. GCS motor subscore is 6.  Skin: Skin is warm and dry. No abrasion and no rash noted.  Psychiatric: She has a normal mood and affect. Her  speech is normal and behavior is normal.  Nursing note and vitals reviewed.   ED Course  Procedures (including critical care time) Labs Review Labs Reviewed  CBC WITH DIFFERENTIAL/PLATELET  BASIC METABOLIC PANEL    Imaging Review No results found. I have personally reviewed and evaluated these images and lab results as part of my medical decision-making.   EKG Interpretation None      MDM   Final diagnoses:  None    Patient given albuterol agonist here continues to have wheezing. Chest x-ray without evidence of CHF. Received Solu-Medrol prior to arrival. Will be admitted for observation  Lacretia Leigh, MD 03/23/16 0040

## 2016-03-23 ENCOUNTER — Other Ambulatory Visit (HOSPITAL_COMMUNITY): Payer: Medicaid Other

## 2016-03-23 DIAGNOSIS — K219 Gastro-esophageal reflux disease without esophagitis: Secondary | ICD-10-CM

## 2016-03-23 DIAGNOSIS — D649 Anemia, unspecified: Secondary | ICD-10-CM | POA: Diagnosis present

## 2016-03-23 DIAGNOSIS — E119 Type 2 diabetes mellitus without complications: Secondary | ICD-10-CM | POA: Diagnosis not present

## 2016-03-23 DIAGNOSIS — E785 Hyperlipidemia, unspecified: Secondary | ICD-10-CM | POA: Diagnosis present

## 2016-03-23 DIAGNOSIS — Z87891 Personal history of nicotine dependence: Secondary | ICD-10-CM | POA: Diagnosis not present

## 2016-03-23 DIAGNOSIS — Z7984 Long term (current) use of oral hypoglycemic drugs: Secondary | ICD-10-CM | POA: Diagnosis not present

## 2016-03-23 DIAGNOSIS — J441 Chronic obstructive pulmonary disease with (acute) exacerbation: Principal | ICD-10-CM

## 2016-03-23 DIAGNOSIS — F419 Anxiety disorder, unspecified: Secondary | ICD-10-CM | POA: Diagnosis present

## 2016-03-23 DIAGNOSIS — G8929 Other chronic pain: Secondary | ICD-10-CM | POA: Diagnosis present

## 2016-03-23 DIAGNOSIS — K5900 Constipation, unspecified: Secondary | ICD-10-CM | POA: Diagnosis present

## 2016-03-23 DIAGNOSIS — J9611 Chronic respiratory failure with hypoxia: Secondary | ICD-10-CM | POA: Diagnosis not present

## 2016-03-23 DIAGNOSIS — G4733 Obstructive sleep apnea (adult) (pediatric): Secondary | ICD-10-CM

## 2016-03-23 DIAGNOSIS — M109 Gout, unspecified: Secondary | ICD-10-CM | POA: Diagnosis present

## 2016-03-23 DIAGNOSIS — I5032 Chronic diastolic (congestive) heart failure: Secondary | ICD-10-CM | POA: Diagnosis present

## 2016-03-23 DIAGNOSIS — E1142 Type 2 diabetes mellitus with diabetic polyneuropathy: Secondary | ICD-10-CM | POA: Diagnosis present

## 2016-03-23 DIAGNOSIS — D573 Sickle-cell trait: Secondary | ICD-10-CM | POA: Diagnosis present

## 2016-03-23 DIAGNOSIS — J9621 Acute and chronic respiratory failure with hypoxia: Secondary | ICD-10-CM | POA: Diagnosis present

## 2016-03-23 DIAGNOSIS — Z6841 Body Mass Index (BMI) 40.0 and over, adult: Secondary | ICD-10-CM | POA: Diagnosis not present

## 2016-03-23 DIAGNOSIS — R0602 Shortness of breath: Secondary | ICD-10-CM | POA: Diagnosis present

## 2016-03-23 DIAGNOSIS — I110 Hypertensive heart disease with heart failure: Secondary | ICD-10-CM | POA: Diagnosis present

## 2016-03-23 LAB — BASIC METABOLIC PANEL
Anion gap: 13 (ref 5–15)
BUN: 19 mg/dL (ref 6–20)
CALCIUM: 8.9 mg/dL (ref 8.9–10.3)
CHLORIDE: 107 mmol/L (ref 101–111)
CO2: 22 mmol/L (ref 22–32)
CREATININE: 1.31 mg/dL — AB (ref 0.44–1.00)
GFR calc Af Amer: 51 mL/min — ABNORMAL LOW (ref >60)
GFR, EST NON AFRICAN AMERICAN: 44 mL/min — AB (ref >60)
Glucose, Bld: 242 mg/dL — ABNORMAL HIGH (ref 65–99)
Potassium: 3.2 mmol/L — ABNORMAL LOW (ref 3.5–5.1)
SODIUM: 142 mmol/L (ref 135–145)

## 2016-03-23 LAB — CBC
HEMATOCRIT: 31.3 % — AB (ref 36.0–46.0)
HEMOGLOBIN: 10.6 g/dL — AB (ref 12.0–15.0)
MCH: 27.3 pg (ref 26.0–34.0)
MCHC: 33.9 g/dL (ref 30.0–36.0)
MCV: 80.7 fL (ref 78.0–100.0)
Platelets: 234 10*3/uL (ref 150–400)
RBC: 3.88 MIL/uL (ref 3.87–5.11)
RDW: 15.2 % (ref 11.5–15.5)
WBC: 6.7 10*3/uL (ref 4.0–10.5)

## 2016-03-23 LAB — BRAIN NATRIURETIC PEPTIDE: B NATRIURETIC PEPTIDE 5: 8.8 pg/mL (ref 0.0–100.0)

## 2016-03-23 LAB — TROPONIN I: Troponin I: <0.03 ng/mL (ref <0.031)

## 2016-03-23 LAB — IRON AND TIBC
Iron: 20 ug/dL — ABNORMAL LOW (ref 28–170)
Saturation Ratios: 5 % — ABNORMAL LOW (ref 10.4–31.8)
TIBC: 438 ug/dL (ref 250–450)
UIBC: 418 ug/dL

## 2016-03-23 LAB — GLUCOSE, CAPILLARY
GLUCOSE-CAPILLARY: 156 mg/dL — AB (ref 65–99)
GLUCOSE-CAPILLARY: 138 mg/dL — AB (ref 65–99)
GLUCOSE-CAPILLARY: 201 mg/dL — AB (ref 65–99)
Glucose-Capillary: 150 mg/dL — ABNORMAL HIGH (ref 65–99)

## 2016-03-23 LAB — D-DIMER, QUANTITATIVE (NOT AT ARMC): D DIMER QUANT: 0.35 ug{FEU}/mL (ref 0.00–0.50)

## 2016-03-23 LAB — CBG MONITORING, ED: GLUCOSE-CAPILLARY: 229 mg/dL — AB (ref 65–99)

## 2016-03-23 MED ORDER — POLYETHYLENE GLYCOL 3350 17 G PO PACK: 17.0000 g | PACK | Freq: Every day | ORAL | Status: DC | PRN

## 2016-03-23 MED ORDER — ALBUTEROL (5 MG/ML) CONTINUOUS INHALATION SOLN
10.0000 mg/h | INHALATION_SOLUTION | Freq: Once | RESPIRATORY_TRACT | Status: AC
  Administered 2016-03-23: 10 mg/h via RESPIRATORY_TRACT
  Filled 2016-03-23: qty 20

## 2016-03-23 MED ORDER — INSULIN ASPART 100 UNIT/ML ~~LOC~~ SOLN
0.0000 [IU] | Freq: Every day | SUBCUTANEOUS | Status: DC
  Administered 2016-03-23 (×2): 2 [IU] via SUBCUTANEOUS

## 2016-03-23 MED ORDER — ZOLPIDEM TARTRATE 5 MG PO TABS
5.0000 mg | ORAL_TABLET | Freq: Every day | ORAL | Status: DC
  Administered 2016-03-23: 5 mg via ORAL
  Filled 2016-03-23 (×2): qty 1

## 2016-03-23 MED ORDER — BUDESONIDE 0.5 MG/2ML IN SUSP
0.5000 mg | Freq: Two times a day (BID) | RESPIRATORY_TRACT | Status: DC
  Administered 2016-03-23 – 2016-03-24 (×3): 0.5 mg via RESPIRATORY_TRACT
  Filled 2016-03-23 (×3): qty 2

## 2016-03-23 MED ORDER — INSULIN ASPART 100 UNIT/ML ~~LOC~~ SOLN
0.0000 [IU] | Freq: Three times a day (TID) | SUBCUTANEOUS | Status: DC
  Administered 2016-03-23: 2 [IU] via SUBCUTANEOUS
  Administered 2016-03-23 – 2016-03-24 (×3): 1 [IU] via SUBCUTANEOUS

## 2016-03-23 MED ORDER — FLEET ENEMA 7-19 GM/118ML RE ENEM: 1.0000 | ENEMA | Freq: Once | RECTAL | Status: DC

## 2016-03-23 MED ORDER — ALBUTEROL SULFATE (2.5 MG/3ML) 0.083% IN NEBU: 2.5000 mg | INHALATION_SOLUTION | Freq: Four times a day (QID) | RESPIRATORY_TRACT | Status: DC | PRN

## 2016-03-23 MED ORDER — LEVOFLOXACIN IN D5W 500 MG/100ML IV SOLN
500.0000 mg | INTRAVENOUS | Status: DC
  Administered 2016-03-23 (×2): 500 mg via INTRAVENOUS
  Filled 2016-03-23 (×2): qty 100

## 2016-03-23 MED ORDER — OLOPATADINE HCL 0.1 % OP SOLN
1.0000 [drp] | Freq: Every day | OPHTHALMIC | Status: DC
  Administered 2016-03-23 – 2016-03-24 (×2): 1 [drp] via OPHTHALMIC
  Filled 2016-03-23: qty 5

## 2016-03-23 MED ORDER — ZOLPIDEM TARTRATE 10 MG PO TABS: 10.0000 mg | ORAL_TABLET | Freq: Every day | ORAL | Status: DC

## 2016-03-23 MED ORDER — METHYLPREDNISOLONE SODIUM SUCC 40 MG IJ SOLR
40.0000 mg | Freq: Three times a day (TID) | INTRAMUSCULAR | Status: DC
  Administered 2016-03-23 – 2016-03-24 (×3): 40 mg via INTRAVENOUS
  Filled 2016-03-23 (×3): qty 1

## 2016-03-23 MED ORDER — LINACLOTIDE 145 MCG PO CAPS
145.0000 ug | ORAL_CAPSULE | Freq: Every day | ORAL | Status: DC | PRN
  Administered 2016-03-24: 145 ug via ORAL
  Filled 2016-03-23 (×2): qty 1

## 2016-03-23 MED ORDER — ARFORMOTEROL TARTRATE 15 MCG/2ML IN NEBU
15.0000 ug | INHALATION_SOLUTION | Freq: Two times a day (BID) | RESPIRATORY_TRACT | Status: DC
  Administered 2016-03-23 – 2016-03-24 (×3): 15 ug via RESPIRATORY_TRACT
  Filled 2016-03-23 (×3): qty 2

## 2016-03-23 MED ORDER — ALLOPURINOL 100 MG PO TABS
100.0000 mg | ORAL_TABLET | Freq: Every day | ORAL | Status: DC
  Administered 2016-03-23 – 2016-03-24 (×2): 100 mg via ORAL
  Filled 2016-03-23 (×2): qty 1

## 2016-03-23 MED ORDER — DOCUSATE SODIUM 100 MG PO CAPS: 200.0000 mg | ORAL_CAPSULE | Freq: Every day | ORAL | Status: DC | PRN

## 2016-03-23 MED ORDER — PREGABALIN 75 MG PO CAPS
75.0000 mg | ORAL_CAPSULE | Freq: Two times a day (BID) | ORAL | Status: DC
  Administered 2016-03-23 – 2016-03-24 (×4): 75 mg via ORAL
  Filled 2016-03-23 (×4): qty 1

## 2016-03-23 MED ORDER — POTASSIUM CHLORIDE CRYS ER 20 MEQ PO TBCR
40.0000 meq | EXTENDED_RELEASE_TABLET | Freq: Once | ORAL | Status: AC
  Administered 2016-03-23: 40 meq via ORAL
  Filled 2016-03-23: qty 2

## 2016-03-23 MED ORDER — ALBUTEROL SULFATE (2.5 MG/3ML) 0.083% IN NEBU
2.5000 mg | INHALATION_SOLUTION | Freq: Four times a day (QID) | RESPIRATORY_TRACT | Status: DC
  Administered 2016-03-23 – 2016-03-24 (×5): 2.5 mg via RESPIRATORY_TRACT
  Filled 2016-03-23 (×6): qty 3

## 2016-03-23 MED ORDER — NYSTATIN 100000 UNIT/GM EX CREA
1.0000 "application " | TOPICAL_CREAM | Freq: Two times a day (BID) | CUTANEOUS | Status: DC
  Administered 2016-03-23 – 2016-03-24 (×3): 1 via TOPICAL
  Filled 2016-03-23: qty 15

## 2016-03-23 MED ORDER — PRAVASTATIN SODIUM 20 MG PO TABS
20.0000 mg | ORAL_TABLET | Freq: Every day | ORAL | Status: DC
  Administered 2016-03-23 – 2016-03-24 (×2): 20 mg via ORAL
  Filled 2016-03-23 (×2): qty 1

## 2016-03-23 MED ORDER — ONDANSETRON HCL 4 MG/2ML IJ SOLN: 4.0000 mg | Freq: Four times a day (QID) | INTRAMUSCULAR | Status: DC | PRN

## 2016-03-23 MED ORDER — HYDROCODONE-ACETAMINOPHEN 7.5-325 MG PO TABS
1.0000 | ORAL_TABLET | Freq: Three times a day (TID) | ORAL | Status: DC | PRN
Start: 2016-03-23 — End: 2016-03-24
  Administered 2016-03-23: 1 via ORAL
  Filled 2016-03-23: qty 1

## 2016-03-23 MED ORDER — IPRATROPIUM-ALBUTEROL 0.5-2.5 (3) MG/3ML IN SOLN: 3.0000 mL | RESPIRATORY_TRACT | Status: DC | PRN

## 2016-03-23 MED ORDER — LORATADINE 10 MG PO TABS
10.0000 mg | ORAL_TABLET | Freq: Every day | ORAL | Status: DC
  Administered 2016-03-23 – 2016-03-24 (×2): 10 mg via ORAL
  Filled 2016-03-23 (×2): qty 1

## 2016-03-23 MED ORDER — ALPRAZOLAM 0.5 MG PO TABS
0.5000 mg | ORAL_TABLET | Freq: Two times a day (BID) | ORAL | Status: DC | PRN
  Administered 2016-03-24: 0.5 mg via ORAL
  Filled 2016-03-23: qty 1

## 2016-03-23 MED ORDER — GUAIFENESIN ER 600 MG PO TB12
600.0000 mg | ORAL_TABLET | Freq: Two times a day (BID) | ORAL | Status: DC
  Administered 2016-03-23 – 2016-03-24 (×4): 600 mg via ORAL
  Filled 2016-03-23 (×4): qty 1

## 2016-03-23 MED ORDER — ONDANSETRON HCL 4 MG PO TABS: 4.0000 mg | ORAL_TABLET | Freq: Four times a day (QID) | ORAL | Status: DC | PRN

## 2016-03-23 MED ORDER — TIOTROPIUM BROMIDE MONOHYDRATE 18 MCG IN CAPS
18.0000 ug | ORAL_CAPSULE | Freq: Every day | RESPIRATORY_TRACT | Status: DC
  Administered 2016-03-23 – 2016-03-24 (×2): 18 ug via RESPIRATORY_TRACT
  Filled 2016-03-23: qty 5

## 2016-03-23 MED ORDER — PANTOPRAZOLE SODIUM 40 MG PO TBEC
80.0000 mg | DELAYED_RELEASE_TABLET | Freq: Every day | ORAL | Status: DC
  Administered 2016-03-23: 80 mg via ORAL
  Filled 2016-03-23: qty 2

## 2016-03-23 MED ORDER — ENOXAPARIN SODIUM 40 MG/0.4ML ~~LOC~~ SOLN
40.0000 mg | SUBCUTANEOUS | Status: DC
  Administered 2016-03-23: 40 mg via SUBCUTANEOUS
  Filled 2016-03-23: qty 0.4

## 2016-03-23 MED ORDER — POLYETHYLENE GLYCOL 3350 17 G PO PACK
17.0000 g | PACK | Freq: Every day | ORAL | Status: DC
  Administered 2016-03-23 – 2016-03-24 (×2): 17 g via ORAL
  Filled 2016-03-23 (×2): qty 1

## 2016-03-23 MED ORDER — PREDNISONE 20 MG PO TABS
40.0000 mg | ORAL_TABLET | Freq: Every day | ORAL | Status: DC
  Administered 2016-03-23: 40 mg via ORAL
  Filled 2016-03-23: qty 2

## 2016-03-23 NOTE — Progress Notes (Signed)
RT placed patient on CPAP. Patient home setting is 3 but setting is 4 cmH2O for comfort. Sterile water was added to water chamber for humidity. 2.5 liters oxygen is bleed into tubing. Patient is tolerating well. RT will continue to monitor.

## 2016-03-23 NOTE — Progress Notes (Signed)
Pt selected [place] for HHRN/COPD.

## 2016-03-23 NOTE — Progress Notes (Signed)
PROGRESS NOTE    Stephanie Schultz  [id number] DOB: November 24, 1955 DOA: 03/22/2016 PCP: Barbette Merino, MD     Brief Narrative: Stephanie Schultz is a 60 y.o. female with medical history significant of COPD, anxiety, OSA on CPAP, morbid obesity, anemia, anxiety, and chronic pain; who presents with complaints of shortness of breath which have progressively worsened over the last week. Shortness of breath symptoms worsen with any kind of exertion. Patient noted getting significantly winded walking from her bedroom to the bathroom the day of admission. She report constipation, indigestion and chest discomfort.   Assessment & Plan:   Principal Problem:   COPD exacerbation ([place]) Active Problems:   Diabetes mellitus type 2, controlled ([place])   Obstructive sleep apnea   GERD (gastroesophageal reflux disease)   Anxiety   Gout   Chronic respiratory failure (H[facility])  1-Acute on chronic hypoxic Respiratory Failure; secondary to COPD exacerbation.  Patient symptoms improved with steroids and nebulizer tx.  Due to dyspnea, and chest x ray with mild  pulmonary edema will order ECHO/  Continue with nebulizer, IV steroids for 24 hours.   2-Acute COPD exacerbation;  Continue with duoneb, IV steroids.  Levaquin.   3-constipation; added Bowel regimen.  4-Chest discomfort, indigestion. Troponin negative, d dimer negative. Check ECHO.  Continue with protonix.   Essential hypertension - Continue Dyazide   Diabetes mellitus type 2 with neuropathy  - Hold glipizide and Actos - CBGs every before meals and at bedtime with sensitive sliding scale insulin  - Continue Lyrica   Chronic pain  - continue hydrocodone prn   Anemia: Chronic. Patient near baseline hemoglobin of 11 Iron low. Start ferrous sulfate at dischareg  Chronic History of diastolic congestive heart failure: last EF 60-65% with a grade 1 diastolic dysfunction in [id number] . Chest x-ray showing some mild basilar congestion. BNP 8. - Continue to  monitor  Anxiety - continue Xanax prn   OSA on CPAP  - RT to supply CPAP  Hyperlipidemia - continue pravastatin  History of gout  -Continue allopurinol   Left leg pain and swelling - d-dimer negative  DVT prophylaxis: lovenox Code Status: full code..  Family Communication: care discussed with patient  Disposition Plan: plan to discharge home in 24 hours    Consultants:   none  Procedures:   ECHO pending  Antimicrobials:   Levaquin.    Subjective: She is feeling better. Breathing better, not at baseline yet. Still with cough and sinus congestion.   Objective: Filed Vitals:   03/23/16 0812 03/23/16 0820 03/23/16 0827 03/23/16 0831  BP:      Pulse:      Temp:      TempSrc:      Resp:      Height:      Weight:      SpO2: 97% 97% 97% 97%   No intake or output data in the 24 hours ending 03/23/16 1300 Filed Weights   03/23/16 0300  Weight: 106.232 kg (234 lb 3.2 oz)    Examination:  General exam: Appears calm and comfortable  Respiratory system:  Respiratory effort normal. Decrease breath sound, sporadic wheezing  Cardiovascular system: S1 & S2 heard, RRR. No JVD, murmurs, rubs, gallops or clicks. No pedal edema. Gastrointestinal system: Abdomen is nondistended, soft and nontender. No organomegaly or masses felt. Normal bowel sounds heard. Central nervous system: Alert and oriented. No focal neurological deficits. Extremities: Symmetric 5 x 5 power. Skin: No rashes, lesions or ulcers Psychiatry: Judgement and insight appear normal. Mood &  affect appropriate.     Data Reviewed: I have personally reviewed following labs and imaging studies  CBC:  Recent Labs Lab 03/22/16 2316 03/23/16 0342  WBC 8.4 6.7  NEUTROABS 6.7  --   HGB 11.1* 10.6*  HCT 32.4* 31.3*  MCV 80.4 80.7  PLT 255 [id number]   Basic Metabolic Panel:  Recent Labs Lab 03/22/16 2316 03/23/16 0342  NA 142 142  K 3.6 3.2*  CL 106 107  CO2 24 22  GLUCOSE 129* 242*  BUN 18 19   CREATININE 1.16* 1.31*  CALCIUM 9.3 8.9   GFR: Estimated Creatinine Clearance: 52.2 mL/min (by C-G formula based on Cr of 1.31). Liver Function Tests: No results for input(s): AST, ALT, ALKPHOS, BILITOT, PROT, ALBUMIN in the last 168 hours. No results for input(s): LIPASE, AMYLASE in the last 168 hours. No results for input(s): AMMONIA in the last 168 hours. Coagulation Profile: No results for input(s): INR, PROTIME in the last 168 hours. Cardiac Enzymes:  Recent Labs Lab 03/23/16 0343  TROPONINI <0.03   BNP (last 3 results) No results for input(s): PROBNP in the last 8760 hours. HbA1C: No results for input(s): HGBA1C in the last 72 hours. CBG:  Recent Labs Lab 03/23/16 0233 03/23/16 0749 03/23/16 1155  GLUCAP 229* 156* 138*   Lipid Profile: No results for input(s): CHOL, HDL, LDLCALC, TRIG, CHOLHDL, LDLDIRECT in the last 72 hours. Thyroid Function Tests: No results for input(s): TSH, T4TOTAL, FREET4, T3FREE, THYROIDAB in the last 72 hours. Anemia Panel:  Recent Labs  03/23/16 0343  TIBC 438  IRON 20*   Urine analysis:    Component Value Date/Time   COLORURINE YELLOW 12/03/2015 0011   APPEARANCEUR CLEAR 12/03/2015 0011   LABSPEC 1.026 12/03/2015 0011   PHURINE 6.0 12/03/2015 0011   GLUCOSEU >1000* 12/03/2015 0011   HGBUR NEGATIVE 12/03/2015 0011   BILIRUBINUR NEGATIVE 12/03/2015 0011   KETONESUR NEGATIVE 12/03/2015 0011   PROTEINUR NEGATIVE 12/03/2015 0011   UROBILINOGEN 0.2 03/31/2015 1231   NITRITE NEGATIVE 12/03/2015 0011   LEUKOCYTESUR NEGATIVE 12/03/2015 0011   Sepsis Labs: No results for input(s): PROCALCITON, LATICACIDVEN in the last 168 hours.  No results found for this or any previous visit (from the past 240 hour(s)).       Radiology Studies: Dg Chest Port 1 View  03/22/2016  CLINICAL DATA:  60 year old female with shortness of breath EXAM: PORTABLE CHEST 1 VIEW COMPARISON:  Radiograph dated 01/23/2016 FINDINGS: Single portable view of  the chest demonstrate minimal bibasilar atelectatic changes/scarring. There is no focal consolidation, pleural effusion, or pneumothorax. Mild increased vascular prominence. The cardiac silhouette is within normal limits. No acute osseous pathology. IMPRESSION: Possible mild vascular congestion.  No focal consolidation. Electronically Signed   By: Anner Crete M.D.   On: 03/22/2016 22:38        Scheduled Meds: . albuterol  2.5 mg Nebulization QID  . allopurinol  100 mg Oral Daily  . arformoterol  15 mcg Nebulization BID  . budesonide (PULMICORT) nebulizer solution  0.5 mg Nebulization BID  . enoxaparin (LOVENOX) injection  40 mg Subcutaneous Q24H  . guaiFENesin  600 mg Oral BID  . insulin aspart  0-5 Units Subcutaneous QHS  . insulin aspart  0-9 Units Subcutaneous TID WC  . levofloxacin (LEVAQUIN) IV  500 mg Intravenous Q24H  . loratadine  10 mg Oral Daily  . methylPREDNISolone (SOLU-MEDROL) injection  40 mg Intravenous Q8H  . nystatin cream  1 application Topical BID  . olopatadine  1  drop Both Eyes Daily  . pantoprazole  80 mg Oral Q1200  . polyethylene glycol  17 g Oral Daily  . pravastatin  20 mg Oral Daily  . pregabalin  75 mg Oral BID  . sodium phosphate  1 enema Rectal Once  . tiotropium  18 mcg Inhalation Daily  . zolpidem  5 mg Oral QHS   Continuous Infusions: . sodium chloride 20 mL/hr at 03/22/16 2321        Time spent: 35 minutes.     Elmarie Shiley, MD [facility] Pager [phone]  If 7PM-7AM, please contact night-coverage [email or web address] Password [email or web address] 03/23/2016, 1:00 PM

## 2016-03-23 NOTE — Evaluation (Signed)
Physical Therapy Evaluation Patient Details Name: Stephanie Schultz MRN: [id number] DOB: [birth year] Today's Date: 03/23/2016   History of Present Illness  60 y.o. female with medical history significant of COPD, anxiety, OSA on CPAP, emphysema, morbid obesity, anemia, anxiety, and chronic pain admitted for acute on chronic COPD exacerbation.  Clinical Impression  Pt admitted with above diagnosis. Pt currently with functional limitations due to the deficits listed below (see PT Problem List).  Pt will benefit from skilled PT to increase their independence and safety with mobility to allow discharge to the venue listed below.   Pt presents with DOE however educated to take frequent rest breaks and to use pursed lip breathing.  Pt reports she is obtaining her GED and walking to classes has been difficult lately due to bookbag and oxygen requirement.  Discussed using an assistive device such as her rollator to assist with mobility and endurance and to place her bookbag on seat for pushing instead of carrying since she is determined to not need to use her electric wheelchair (at least for classes).  SpO2 88-93% on 3L O2 [place] during ambulation.     Follow Up Recommendations No PT follow up    Equipment Recommendations  None recommended by PT    Recommendations for Other Services       Precautions / Restrictions Precautions Precaution Comments: monitor sats, chronic O2      Mobility  Bed Mobility Overal bed mobility: Needs Assistance Bed Mobility: Supine to Sit     Supine to sit: Supervision     General bed mobility comments: for lines  Transfers Overall transfer level: Needs assistance Equipment used: None Transfers: Sit to/from Stand Sit to Stand: Supervision         General transfer comment: supervision for lines  Ambulation/Gait Ambulation/Gait assistance: Min guard;Supervision Ambulation Distance (Feet): 360 Feet Assistive device: None Gait Pattern/deviations: Step-through  pattern;Decreased stride length     General Gait Details: pt pushed IV pole, required 4 standing rest breaks due to dyspnea, SpO2 dropped to 88% upon time for rest breaks and improved with pursed lip breathing and rest back up to 93%, remained on 3L O2 [place]  Stairs            Wheelchair Mobility    Modified Rankin (Stroke Patients Only)       Balance                                             Pertinent Vitals/Pain Pain Assessment: No/denies pain    Home Living Family/patient expects to be discharged to:: Private residence Living Arrangements: Alone   Type of Home: Apartment Home Access: Level entry     Home Layout: One level Home Equipment: Walker - 4 wheels;Cane - single point;Electric scooter      Prior Function Level of Independence: Independent with assistive device(s)         Comments: uses assistive device only when needed, chronic oxygen 2.5 -3L at baseline     Hand Dominance        Extremity/Trunk Assessment               Lower Extremity Assessment: Overall WFL for tasks assessed         Communication   Communication: No difficulties  Cognition Arousal/Alertness: Awake/alert Behavior During Therapy: WFL for tasks assessed/performed Overall Cognitive Status: Within Functional Limits for tasks assessed  General Comments      Exercises        Assessment/Plan    PT Assessment Patient needs continued PT services  PT Diagnosis Difficulty walking   PT Problem List Decreased activity tolerance;Decreased mobility;Cardiopulmonary status limiting activity  PT Treatment Interventions DME instruction;Gait training;Functional mobility training;Patient/family education;Therapeutic activities;Therapeutic exercise   PT Goals (Current goals can be found in the Care Plan section) Acute Rehab PT Goals PT Goal Formulation: With patient Time For Goal Achievement: 03/30/16 Potential to Achieve  Goals: Good    Frequency Min 3X/week   Barriers to discharge        Co-evaluation               End of Session Equipment Utilized During Treatment: Oxygen Activity Tolerance: Patient tolerated treatment well Patient left: in chair;with call bell/phone within reach;with nursing/sitter in room;with chair alarm set      Functional Assessment Tool Used: clinical judgement Functional Limitation: Mobility: Walking and moving around Mobility: Walking and Moving Around Current Status [id number]): At least 1 percent but less than 20 percent impaired, limited or restricted Mobility: Walking and Moving Around Goal Status [phone]): At least 1 percent but less than 20 percent impaired, limited or restricted    Time: 0926-0949 PT Time Calculation (min) (ACUTE ONLY): 23 min   Charges:   PT Evaluation $PT Eval Low Complexity: 1 Procedure     PT G Codes:   PT G-Codes **NOT FOR INPATIENT CLASS** Functional Assessment Tool Used: clinical judgement Functional Limitation: Mobility: Walking and moving around Mobility: Walking and Moving Around Current Status [id number]): At least 1 percent but less than 20 percent impaired, limited or restricted Mobility: Walking and Moving Around Goal Status [phone]): At least 1 percent but less than 20 percent impaired, limited or restricted    Shawnee Gambone,KATHrine E 03/23/2016, 11:31 AM Carmelia Bake, PT, DPT 03/23/2016 Pager: [phone]

## 2016-03-23 NOTE — H&P (Addendum)
History and Physical    Stephanie Schultz [id number] DOB: Feb 21, 1956 DOA: 03/22/2016  Referring MD/NP/PA: Dr. Zenia Resides PCP: Barbette Merino, MD   Patient coming from: Home  Chief Complaint: Shortness of breath  HPI: Stephanie Schultz is a 60 y.o. female with medical history significant of COPD, anxiety, OSA on CPAP, morbid obesity, anemia, anxiety, and chronic pain; who presents with complaints of shortness of breath which have progressively worsened over the last week. She notes that she had been putting off coming in due therapy classes as she is trying to get her GED. Associated symptoms included cough with thick yellowish sputum production, wheezing, and night sweats. Shortness of breath symptoms worsen with any kind of exertion. Patient noted getting significantly winded walking from her bedroom to the bathroom today. She denies any recent travel or prolonged periods of sitting. Home inhalers provided no relief of shortness of breath symptoms. The patient has noted some left leg swelling and pain with walking that is new and has noticed this over the last few days. Denies any chest pain, hemoptysis, fever, chills, focal weakness, changes in vision, or recent sick contacts. Patient last had similar symptoms which required her to be admitted to the hospital back in 01/2016. She successfully stopped smoking cigarettes on 11/06/2015.   ED Course: Upon admission to the emergency department patient was seen to be afebrile, tachypneic with respiratory rate up to 30, pulse up to 101, blood pressure maintained, and O2 sats is low as 85% on her home oxygen requirement. Initial lab work revealed WBC 8.4, hemoglobin 11.1, creatinine of 1.16, glucose 129, and all other labs were relatively unremarkable.  Chest x-ray showed signs of possible mild vascular congestion.  Review of Systems: As per HPI otherwise 10 point review of systems negative.    Past Medical History  Diagnosis Date  . Hypertension   . Anxiety   .  OSA on CPAP     CPAP at night   . Dizziness     pt believes this is motion sickness or vertigo  . GERD (gastroesophageal reflux disease)   . Headache(784.0)   . Cancer ([place]) 1990    cervical   . Anemia   . Morbid obesity ([place])   . Sickle cell trait ([place])   . Hx of cardiac catheterization     a. LHC at [facility] in [place], [place] 09/2008:  Normal coronary arteries EF 70%.  . Tobacco abuse     a. up to 3ppd from age 22 to 89, now 1/4 ppd (01/2013)  . Shortness of breath   . Pneumonia   . Diabetes mellitus without complication ([place])   . Emphysema   . COPD (chronic obstructive pulmonary disease) ([place])     dR . ROBERT BYOUM  . Hyperlipidemia   . Supplemental oxygen dependent     2L CONTINUOUSLEY  . Numbness     FINGER TIPS  . Arthritis   . Gout   . Gallstones     Past Surgical History  Procedure Laterality Date  . Tubal ligation    . Cardiac catheterization    . Colonoscopy  09/05/2012    Procedure: COLONOSCOPY;  Surgeon: Beryle Beams, MD;  Location: [facility] ENDOSCOPY;  Service: Endoscopy;  Laterality: N/A;  . Colonoscopy with propofol N/A 09/02/2015    Procedure: COLONOSCOPY WITH PROPOFOL;  Surgeon: Carol [place], MD;  Location: [facility]OSCOPY;  Service: Endoscopy;  Laterality: N/A;  . Cholecystectomy N/A 12/13/2015    Procedure: LAPAROSCOPIC CHOLECYSTECTOMY;  Surgeon: Ralene Ok, MD;  Location:  [facility] ORS;  Service: General;  Laterality: N/A;     reports that she quit smoking about 4 months ago. Her smoking use included Cigarettes. She has a 18 pack-year smoking history. She has never used smokeless tobacco. She reports that she does not drink alcohol or use illicit drugs.  Allergies  Allergen Reactions  . Acetaminophen Other (See Comments)    Pt was told that she could not take Tylenol, does not know why    Family History  Problem Relation Age of Onset  . Lung cancer Paternal Aunt   . Lung cancer Paternal Grandfather   . Other Father     unaware of father's medical history  .  Diabetes Mother     alive @ 81  . Myasthenia gravis Mother   . Other      multiple siblings a&w.    Prior to Admission medications   Medication Sig Start Date End Date Taking? Authorizing Provider  albuterol (PROVENTIL) (2.5 MG/3ML) 0.083% nebulizer solution Take 3 mLs (2.5 mg total) by nebulization 4 (four) times daily. DX J44.9 Patient taking differently: Take 2.5 mg by nebulization every 6 (six) hours as needed for wheezing or shortness of breath. DX J44.9 05/03/15  Yes Collene Gobble, MD  allopurinol (ZYLOPRIM) 100 MG tablet Take 100 mg by mouth daily.   Yes Historical Provider, MD  ALPRAZolam Duanne Moron) 0.5 MG tablet Take 1 tablet (0.5 mg total) by mouth 2 (two) times daily as needed for anxiety. 02/22/15  Yes Bonnielee Haff, MD  budesonide-formoterol [facility]) 160-4.5 MCG/ACT inhaler Inhale 2 puffs into the lungs 2 (two) times daily. 05/03/15  Yes Collene Gobble, MD  docusate sodium (COLACE) 100 MG capsule Take 2 capsules (200 mg total) by mouth daily. Patient taking differently: Take 200 mg by mouth daily as needed for mild constipation.  01/26/16  Yes Lavina Hamman, MD  esomeprazole (NEXIUM) 40 MG capsule Take 1 capsule (40 mg total) by mouth daily before breakfast. 01/26/16  Yes Lavina Hamman, MD  glipiZIDE (GLUCOTROL) 10 MG tablet Take 10 mg by mouth 2 (two) times daily before a meal.    Yes Historical Provider, MD  HYDROcodone-acetaminophen (NORCO) 7.5-325 MG per tablet Take 1 tablet by mouth every 4 (four) hours as needed for moderate pain or severe pain. Patient taking differently: Take 1 tablet by mouth every 8 (eight) hours as needed for moderate pain or severe pain.  04/01/15  Yes Thurnell Lose, MD  ibuprofen (ADVIL,MOTRIN) 800 MG tablet Take 800 mg by mouth every 8 (eight) hours as needed for moderate pain.    Yes Historical Provider, MD  ipratropium (ATROVENT) 0.02 % nebulizer solution USE 1 VIAL IN NEBULIZER EVERY 6 HOURS AS NEEDED FOR WHEEZING OR SHORTNESS OF BREATH. 02/10/16   Yes Collene Gobble, MD  Linaclotide (LINZESS) 145 MCG CAPS capsule Take 145 mcg by mouth daily as needed (CONSTIPATION).    Yes Historical Provider, MD  loratadine (CLARITIN) 10 MG tablet Take 10 mg by mouth daily.   Yes Historical Provider, MD  nystatin cream (MYCOSTATIN) Apply 1 application topically 2 (two) times daily. Yeast infection   Yes Historical Provider, MD  Olopatadine HCl (PAZEO) 0.7 % SOLN Apply 1 drop to eye daily.   Yes Historical Provider, MD  pioglitazone (ACTOS) 30 MG tablet Take 30 mg by mouth daily. 12/29/15  Yes Historical Provider, MD  polyethylene glycol (MIRALAX / GLYCOLAX) packet Take 17 g by mouth daily. Patient taking differently: Take 17 g by mouth daily  as needed for mild constipation.  01/26/16  Yes Lavina Hamman, MD  pravastatin (PRAVACHOL) 20 MG tablet Take 20 mg by mouth daily.   Yes Historical Provider, MD  pregabalin (LYRICA) 75 MG capsule Take 75 mg by mouth 2 (two) times daily.   Yes Historical Provider, MD  PROAIR HFA 108 (90 Base) MCG/ACT inhaler INHALE 2 PUFFS BY MOUTH EVERY 4 HOURS AS NEEDED FOR WHEEZING 12/19/15  Yes Collene Gobble, MD  tiotropium (SPIRIVA) 18 MCG inhalation capsule Place 1 capsule (18 mcg total) into inhaler and inhale daily. 05/03/15  Yes Collene Gobble, MD  triamterene-hydrochlorothiazide (DYAZIDE) 37.5-25 MG per capsule Take 1 capsule by mouth daily.   Yes Historical Provider, MD  zolpidem (AMBIEN) 10 MG tablet Take 10 mg by mouth at bedtime.   Yes Historical Provider, MD  predniSONE (DELTASONE) 10 MG tablet Take 30mg  daily for 3days,Take 20mg  daily for 3days,Take 10mg  daily for 3days, then stop. Patient not taking: Reported on 03/22/2016 01/26/16   Lavina Hamman, MD    Physical Exam: Filed Vitals:   03/22/16 2154 03/22/16 2316 03/22/16 2319 03/23/16 0026  BP: 103/59 125/76 125/76 112/58  Pulse: 94 101 101 95  Temp: 97.9 F (36.6 C)     TempSrc: Oral     Resp: 30 4 20 20   SpO2: 97% 85% 92% 94%      Constitutional: Obese female  currently receiving breathing treatment in no acute distress. Filed Vitals:   03/22/16 2154 03/22/16 2316 03/22/16 2319 03/23/16 0026  BP: 103/59 125/76 125/76 112/58  Pulse: 94 101 101 95  Temp: 97.9 F (36.6 C)     TempSrc: Oral     Resp: 30 4 20 20   SpO2: 97% 85% 92% 94%   Eyes: PERRL, lids and conjunctivae normal ENMT: Mucous membranes are moist. Posterior pharynx clear of any exudate or lesions.Normal dentition.  Neck: normal, supple, no masses, no thyromegaly Respiratory: Mildly Tachypneic, but able to talk in complete sentences. Decreased overall aeration. Cannot appreciate any wheezes but patient currently receiving hour-long DuoNeb. Cardiovascular: Regular rate and rhythm, no murmurs / rubs / gallops. Mild left leg swelling with trace- 1+ edema. 2+ pedal pulses. No carotid bruits.  Abdomen: no tenderness, no masses palpated. No hepatosplenomegaly. Bowel sounds positive.  Musculoskeletal: no clubbing / cyanosis. No joint deformity upper and lower extremities. Good ROM, no contractures. Normal muscle tone.  Skin: no rashes, lesions, ulcers. No induration Neurologic: CN 2-12 grossly intact. Sensation intact, DTR normal. Strength 5/5 in all 4.  Psychiatric: Normal judgment and insight. Alert and oriented x 3. Normal mood.    Labs on Admission: I have personally reviewed following labs and imaging studies  CBC:  Recent Labs Lab 03/22/16 2316  WBC 8.4  NEUTROABS 6.7  HGB 11.1*  HCT 32.4*  MCV 80.4  PLT 123456   Basic Metabolic Panel:  Recent Labs Lab 03/22/16 2316  NA 142  K 3.6  CL 106  CO2 24  GLUCOSE 129*  BUN 18  CREATININE 1.16*  CALCIUM 9.3   GFR: CrCl cannot be calculated (Unknown ideal weight.). Liver Function Tests: No results for input(s): AST, ALT, ALKPHOS, BILITOT, PROT, ALBUMIN in the last 168 hours. No results for input(s): LIPASE, AMYLASE in the last 168 hours. No results for input(s): AMMONIA in the last 168 hours. Coagulation Profile: No  results for input(s): INR, PROTIME in the last 168 hours. Cardiac Enzymes: No results for input(s): CKTOTAL, CKMB, CKMBINDEX, TROPONINI in the last 168 hours. BNP (  last 3 results) No results for input(s): PROBNP in the last 8760 hours. HbA1C: No results for input(s): HGBA1C in the last 72 hours. CBG: No results for input(s): GLUCAP in the last 168 hours. Lipid Profile: No results for input(s): CHOL, HDL, LDLCALC, TRIG, CHOLHDL, LDLDIRECT in the last 72 hours. Thyroid Function Tests: No results for input(s): TSH, T4TOTAL, FREET4, T3FREE, THYROIDAB in the last 72 hours. Anemia Panel: No results for input(s): VITAMINB12, FOLATE, FERRITIN, TIBC, IRON, RETICCTPCT in the last 72 hours. Urine analysis:    Component Value Date/Time   COLORURINE YELLOW 12/03/2015 0011   APPEARANCEUR CLEAR 12/03/2015 0011   LABSPEC 1.026 12/03/2015 0011   PHURINE 6.0 12/03/2015 0011   GLUCOSEU >1000* 12/03/2015 0011   HGBUR NEGATIVE 12/03/2015 0011   BILIRUBINUR NEGATIVE 12/03/2015 0011   KETONESUR NEGATIVE 12/03/2015 0011   PROTEINUR NEGATIVE 12/03/2015 0011   UROBILINOGEN 0.2 03/31/2015 1231   NITRITE NEGATIVE 12/03/2015 0011   LEUKOCYTESUR NEGATIVE 12/03/2015 0011   Sepsis Labs: No results found for this or any previous visit (from the past 240 hour(s)).   Radiological Exams on Admission: Dg Chest Port 1 View  03/22/2016  CLINICAL DATA:  60 year old female with shortness of breath EXAM: PORTABLE CHEST 1 VIEW COMPARISON:  Radiograph dated 01/23/2016 FINDINGS: Single portable view of the chest demonstrate minimal bibasilar atelectatic changes/scarring. There is no focal consolidation, pleural effusion, or pneumothorax. Mild increased vascular prominence. The cardiac silhouette is within normal limits. No acute osseous pathology. IMPRESSION: Possible mild vascular congestion.  No focal consolidation. Electronically Signed   By: Anner Crete M.D.   On: 03/22/2016 22:38    EKG: Independently  reviewed.Sinus tachycardia with borderline QT interval   Assessment/Plan Acute on chronic COPD exacerbation with chronic respiratory failure on 2 L of nasal oxygen: Patient with shortness of breath, wheezing, cough, and thick sputum production. Chest x-ray showed mild vascular congestion. - Admit to telemetry bed - Continuous pulse oximetry with nasal cannula oxygen to keep O2 sats greater than 92%  - Levaquin IV - DuoNeb prn 4 hours - Budesonide and Brovana nebs q12 hrs  - Continue Spiriva  - Prednisone 40 mg po - Mucinex   Left leg pain and swelling -Check d-dimer   Essential hypertension - Continue Dyazide   Diabetes mellitus type 2 with neuropathy  - Hold glipizide and Actos - CBGs every before meals and at bedtime with sensitive sliding scale insulin  - Continue Lyrica   Chronic pain  - continue hydrocodone prn   Anemia: Chronic. Patient near baseline hemoglobin of 11 - repeat CBC in am - Iron & TIBC  History of diastolic congestive heart failure: last EF 60-65% with a grade 1 diastolic dysfunction  in [id number] . Chest x-ray showing some mild basilar congestion. BNP 8. - Continue to monitor  Anxiety - continue Xanax prn   OSA on CPAP   - RT to supply CPAP  Hyperlipidemia - continue pravastatin  History of gout  -Continue allopurinol    GERD - Pharmacy substitution of Protonix for Nexium  Insomnia - Continue Ambien  DVT prophylaxis: lovenox Code Status: Full Family Communication: none  Disposition Plan: Possible discharge home in 1-3 days  Consults called:  None Admission status: Telemetry observation   Norval Morton MD [facility] Pager [phone]  If 7PM-7AM, please contact night-coverage [email or web address] Password [facility]  03/23/2016, 12:47 AM

## 2016-03-24 ENCOUNTER — Inpatient Hospital Stay (HOSPITAL_COMMUNITY): Payer: Medicaid Other

## 2016-03-24 LAB — BASIC METABOLIC PANEL
Anion gap: 10 (ref 5–15)
BUN: 22 mg/dL — AB (ref 6–20)
CO2: 28 mmol/L (ref 22–32)
CREATININE: 1.12 mg/dL — AB (ref 0.44–1.00)
Calcium: 9.5 mg/dL (ref 8.9–10.3)
Chloride: 107 mmol/L (ref 101–111)
GFR calc Af Amer: >60 mL/min (ref >60)
GFR, EST NON AFRICAN AMERICAN: 53 mL/min — AB (ref >60)
GLUCOSE: 164 mg/dL — AB (ref 65–99)
Potassium: 4.8 mmol/L (ref 3.5–5.1)
SODIUM: 145 mmol/L (ref 135–145)

## 2016-03-24 LAB — GLUCOSE, CAPILLARY: GLUCOSE-CAPILLARY: 138 mg/dL — AB (ref 65–99)

## 2016-03-24 MED ORDER — FLUTICASONE PROPIONATE 50 MCG/ACT NA SUSP
1.0000 | Freq: Every day | NASAL | Status: DC
  Administered 2016-03-24: 1 via NASAL
  Filled 2016-03-24: qty 16

## 2016-03-24 MED ORDER — PREDNISONE 20 MG PO TABS: ORAL_TABLET | ORAL | Status: DC

## 2016-03-24 MED ORDER — POLYETHYLENE GLYCOL 3350 17 G PO PACK: 17.0000 g | PACK | Freq: Every day | ORAL | Status: DC

## 2016-03-24 MED ORDER — PANTOPRAZOLE SODIUM 40 MG PO TBEC: 80.0000 mg | DELAYED_RELEASE_TABLET | Freq: Every day | ORAL | Status: DC

## 2016-03-24 MED ORDER — LEVOFLOXACIN 500 MG PO TABS: 500.0000 mg | ORAL_TABLET | Freq: Every day | ORAL | Status: DC

## 2016-03-24 MED ORDER — FLUTICASONE PROPIONATE 50 MCG/ACT NA SUSP: 1.0000 | Freq: Every day | NASAL | Status: DC

## 2016-03-24 MED ORDER — GUAIFENESIN ER 600 MG PO TB12: 600.0000 mg | ORAL_TABLET | Freq: Two times a day (BID) | ORAL | Status: DC

## 2016-03-24 NOTE — Progress Notes (Signed)
PHARMACIST - PHYSICIAN COMMUNICATION DR:   Tyrell Antonio CONCERNING: Antibiotic IV to Oral Route Change Policy  RECOMMENDATION: This patient is receiving Levaquin by the intravenous route.  Based on criteria approved by the Pharmacy and Therapeutics Committee, the antibiotic(s) is/are being converted to the equivalent oral dose form(s).   DESCRIPTION: These criteria include:  Patient being treated for a respiratory tract infection, urinary tract infection, cellulitis or clostridium difficile associated diarrhea if on metronidazole  The patient is not neutropenic and does not exhibit a GI malabsorption state  The patient is eating (either orally or via tube) and/or has been taking other orally administered medications for a least 24 hours  The patient is improving clinically and has a Tmax < 100.5  If you have questions about this conversion, please contact the Pharmacy Department  []   [phone] )  [facility] []   [phone] )  [facility] []   [phone] )  [facility] []   [phone] )  [facility] [x]   [phone] )  [place], PharmD, BCPS Pager: [phone] 03/24/2016@7 :03 AM

## 2016-03-24 NOTE — Discharge Summary (Signed)
Physician Discharge Summary  Kirti Moynahan [id number] DOB: October 12, 1956 DOA: 03/22/2016  PCP: Barbette Merino, MD  Admit date: 03/22/2016 Discharge date: 03/24/2016  Time spent: 35 minutes  Recommendations for Outpatient Follow-up:  Needs evaluation for anemia. Consider iron supplement Needs GI evaluation for constipation, indigestion, anemia.    Discharge Diagnoses:    Acute COPD exacerbation ([place])  Acute on chronic hypoxic Respiratory Failure;   Diabetes mellitus type 2, controlled ([place])   Obstructive sleep apnea   GERD (gastroesophageal reflux disease)   Anxiety   Gout   Chronic respiratory failure ([place])   Discharge Condition: stable  Diet recommendation: Carb modified.   Filed Weights   03/23/16 0300  Weight: 106.232 kg (234 lb 3.2 oz)    History of present illness:  HPI: Stephanie Schultz is a 60 y.o. female with medical history significant of COPD, anxiety, OSA on CPAP, morbid obesity, anemia, anxiety, and chronic pain; who presents with complaints of shortness of breath which have progressively worsened over the last week. She notes that she had been putting off coming in due therapy classes as she is trying to get her GED. Associated symptoms included cough with thick yellowish sputum production, wheezing, and night sweats. Shortness of breath symptoms worsen with any kind of exertion. Patient noted getting significantly winded walking from her bedroom to the bathroom today. She denies any recent travel or prolonged periods of sitting. Home inhalers provided no relief of shortness of breath symptoms. The patient has noted some left leg swelling and pain with walking that is new and has noticed this over the last few days. Denies any chest pain, hemoptysis, fever, chills, focal weakness, changes in vision, or recent sick contacts. Patient last had similar symptoms which required her to be admitted to the hospital back in 01/2016. She successfully stopped smoking cigarettes on  11/06/2015.  Hospital Course:   Brief Narrative: Sandralee Foto is a 60 y.o. female with medical history significant of COPD, anxiety, OSA on CPAP, morbid obesity, anemia, anxiety, and chronic pain; who presents with complaints of shortness of breath which have progressively worsened over the last week. Shortness of breath symptoms worsen with any kind of exertion. Patient noted getting significantly winded walking from her bedroom to the bathroom the day of admission. She report constipation, indigestion and chest discomfort.   Assessment & Plan:  Principal Problem:  COPD exacerbation ([place]) Active Problems:  Diabetes mellitus type 2, controlled ([place])  Obstructive sleep apnea  GERD (gastroesophageal reflux disease)  Anxiety  Gout  Chronic respiratory failure ([facility])  1-Acute on chronic hypoxic Respiratory Failure; secondary to COPD exacerbation.  Patient symptoms improved with steroids and nebulizer tx.  Continue with nebulizer, IV steroids for 24 hours.  Improved with tx of COPD. She had an echo 2 month ago. No need to repeated.   2-Acute COPD exacerbation;  Continue with duoneb, IV steroids.  Levaquin.  Discharge on prednisone taper.   3-constipation; added Bowel regimen. Had small bowel movement.  4-Chest discomfort 1 week ago, indigestion. Troponin negative, d dimer negative.  Continue with protonix.  Needs to follow up with GI  Essential hypertension - Continue Dyazide   Diabetes mellitus type 2 with neuropathy  - resume glipizide and Actos - CBGs every before meals and at bedtime with sensitive sliding scale insulin  - Continue Lyrica   Chronic pain  - continue hydrocodone prn   Anemia: Chronic. Patient near baseline hemoglobin of 11 Iron low. Will needs  ferrous sulfate when constipation better controlled   Chronic  History of diastolic congestive heart failure: last EF 60-65% with a grade 1 diastolic dysfunction in [id number] . Chest x-ray showing some  mild basilar congestion. BNP 8. - Continue to monitor  Anxiety - continue Xanax prn   OSA on CPAP  - RT to supply CPAP  Hyperlipidemia - continue pravastatin  History of gout  -Continue allopurinol   Left leg pain and swelling - d-dimer negative  Procedures:  none  Consultations:  none  Discharge Exam: Filed Vitals:   03/23/16 2042 03/24/16 0547  BP: 131/66 113/65  Pulse: 95 85  Temp: 99.2 F (37.3 C) 98.2 F (36.8 C)  Resp: 22 24    General: NAD Cardiovascular: S 1, S 2 RRR Respiratory: CTA  Discharge Instructions   Discharge Instructions    Diet general    Complete by:  As directed      Increase activity slowly    Complete by:  As directed           Current Discharge Medication List    START taking these medications   Details  fluticasone (FLONASE) 50 MCG/ACT nasal spray Place 1 spray into both nostrils daily. Qty: 16 g, Refills: 0    guaiFENesin (MUCINEX) 600 MG 12 hr tablet Take 1 tablet (600 mg total) by mouth 2 (two) times daily. Qty: 30 tablet, Refills: 0    levofloxacin (LEVAQUIN) 500 MG tablet Take 1 tablet (500 mg total) by mouth at bedtime. Qty: 2 tablet, Refills: 0    pantoprazole (PROTONIX) 40 MG tablet Take 2 tablets (80 mg total) by mouth daily at 12 noon. Qty: 30 tablet, Refills: 0      CONTINUE these medications which have CHANGED   Details  polyethylene glycol (MIRALAX / GLYCOLAX) packet Take 17 g by mouth daily. Qty: 14 each, Refills: 0    predniSONE (DELTASONE) 20 MG tablet Take 2 tablet by mouth for 4 days Qty: 8 tablet, Refills: 0      CONTINUE these medications which have NOT CHANGED   Details  albuterol (PROVENTIL) (2.5 MG/3ML) 0.083% nebulizer solution Take 3 mLs (2.5 mg total) by nebulization 4 (four) times daily. DX J44.9 Qty: 360 mL, Refills: 2    allopurinol (ZYLOPRIM) 100 MG tablet Take 100 mg by mouth daily.    ALPRAZolam (XANAX) 0.5 MG tablet Take 1 tablet (0.5 mg total) by mouth 2 (two) times  daily as needed for anxiety. Qty: 10 tablet, Refills: 0    budesonide-formoterol (SYMBICORT) 160-4.5 MCG/ACT inhaler Inhale 2 puffs into the lungs 2 (two) times daily. Qty: 1 Inhaler, Refills: 6    docusate sodium (COLACE) 100 MG capsule Take 2 capsules (200 mg total) by mouth daily. Qty: 10 capsule, Refills: 0    glipiZIDE (GLUCOTROL) 10 MG tablet Take 10 mg by mouth 2 (two) times daily before a meal.     HYDROcodone-acetaminophen (NORCO) 7.5-325 MG per tablet Take 1 tablet by mouth every 4 (four) hours as needed for moderate pain or severe pain. Qty: 10 tablet, Refills: 0    ipratropium (ATROVENT) 0.02 % nebulizer solution USE 1 VIAL IN NEBULIZER EVERY 6 HOURS AS NEEDED FOR WHEEZING OR SHORTNESS OF BREATH. Qty: 300 mL, Refills: 5    Linaclotide (LINZESS) 145 MCG CAPS capsule Take 145 mcg by mouth daily as needed (CONSTIPATION).     loratadine (CLARITIN) 10 MG tablet Take 10 mg by mouth daily.    nystatin cream (MYCOSTATIN) Apply 1 application topically 2 (two) times daily. Yeast infection    Olopatadine HCl (  PAZEO) 0.7 % SOLN Apply 1 drop to eye daily.    pioglitazone (ACTOS) 30 MG tablet Take 30 mg by mouth daily. Refills: 0    pravastatin (PRAVACHOL) 20 MG tablet Take 20 mg by mouth daily.    pregabalin (LYRICA) 75 MG capsule Take 75 mg by mouth 2 (two) times daily.    PROAIR HFA 108 (90 Base) MCG/ACT inhaler INHALE 2 PUFFS BY MOUTH EVERY 4 HOURS AS NEEDED FOR WHEEZING Qty: 8.5 g, Refills: 5    tiotropium (SPIRIVA) 18 MCG inhalation capsule Place 1 capsule (18 mcg total) into inhaler and inhale daily. Qty: 30 capsule, Refills: 6    triamterene-hydrochlorothiazide (DYAZIDE) 37.5-25 MG per capsule Take 1 capsule by mouth daily.    zolpidem (AMBIEN) 10 MG tablet Take 10 mg by mouth at bedtime.      STOP taking these medications     esomeprazole (NEXIUM) 40 MG capsule      ibuprofen (ADVIL,MOTRIN) 800 MG tablet        Allergies  Allergen Reactions  . Acetaminophen  Other (See Comments)    Pt was told that she could not take Tylenol, does not know why   Follow-up Information    Follow up with [facility], MD In 1 week.   Specialty:  Internal Medicine   Contact information:   [address] [place] [address] [phone]        The results of significant diagnostics from this hospitalization (including imaging, microbiology, ancillary and laboratory) are listed below for reference.    Significant Diagnostic Studies: Dg Chest Port 1 View  03/22/2016  CLINICAL DATA:  60 year old female with shortness of breath EXAM: PORTABLE CHEST 1 VIEW COMPARISON:  Radiograph dated 01/23/2016 FINDINGS: Single portable view of the chest demonstrate minimal bibasilar atelectatic changes/scarring. There is no focal consolidation, pleural effusion, or pneumothorax. Mild increased vascular prominence. The cardiac silhouette is within normal limits. No acute osseous pathology. IMPRESSION: Possible mild vascular congestion.  No focal consolidation. Electronically Signed   By: Anner Crete M.D.   On: 03/22/2016 22:38    Microbiology: No results found for this or any previous visit (from the past 240 hour(s)).   Labs: Basic Metabolic Panel:  Recent Labs Lab 03/22/16 2316 03/23/16 0342 03/24/16 0425  NA 142 142 145  K 3.6 3.2* 4.8  CL 106 107 107  CO2 24 22 28   GLUCOSE 129* 242* 164*  BUN 18 19 22*  CREATININE 1.16* 1.31* 1.12*  CALCIUM 9.3 8.9 9.5   Liver Function Tests: No results for input(s): AST, ALT, ALKPHOS, BILITOT, PROT, ALBUMIN in the last 168 hours. No results for input(s): LIPASE, AMYLASE in the last 168 hours. No results for input(s): AMMONIA in the last 168 hours. CBC:  Recent Labs Lab 03/22/16 2316 03/23/16 0342  WBC 8.4 6.7  NEUTROABS 6.7  --   HGB 11.1* 10.6*  HCT 32.4* 31.3*  MCV 80.4 80.7  PLT 255 234   Cardiac Enzymes:  Recent Labs Lab 03/23/16 0343  TROPONINI <0.03   BNP: BNP (last 3 results)  Recent Labs   03/31/15 1231 01/23/16 1535 03/23/16 0343  BNP 4.1 16.1 8.8    ProBNP (last 3 results) No results for input(s): PROBNP in the last 8760 hours.  CBG:  Recent Labs Lab 03/23/16 0749 03/23/16 1155 03/23/16 1707 03/23/16 2040 03/24/16 0804  GLUCAP 156* 138* 150* 201* 138*       Signed:  Niel Hummer A MD.  [facility] 03/24/2016, 9:29 AM

## 2016-03-24 NOTE — Care Management Note (Signed)
Case Management Note  Patient Details  Name: Stephanie Schultz MRN: [id number] Date of Birth: [birth year]  Subjective/Objective:      COPD exac              Action/Plan: Discharge Planning: AVS reviewed:  NCM spoke to pt and states she is active with [facility] for [facility]. She has her portable tank in the room. She has neb machine and RW at home. Notified [place] Liaison of pt's scheduled dc home today.  Roland Rack MD  Expected Discharge Date:  03/24/16               Expected Discharge Plan:  [place]  In-House Referral:  NA  Discharge planning Services  CM Consult  Post Acute Care Choice:  Home Health, Resumption of Svcs/PTA Provider Choice offered to:  Patient  DME Arranged:  N/A DME Agency:  NA  HH Arranged:  RN [place] Agency:  [place]  Status of Service:  Completed, signed off  Medicare Important Message Given:    Date Medicare IM Given:    Medicare IM give by:    Date Additional Medicare IM Given:    Additional Medicare Important Message give by:     If discussed at [place] of Stay Meetings, dates discussed:    Additional Comments:  Erenest Rasher, RN 03/24/2016, 11:04 AM

## 2016-03-24 NOTE — Progress Notes (Signed)
Pt presents with severe anxiety while wearing CPAP. Respiratory called to see if they have a mask similar to the one at home and they do not. CPAP removed and oxygen placed via nasal cannula. O2 sat 94% on 2.5L. Xanax 0.5mg  po given for anxiety.

## 2016-03-30 ENCOUNTER — Encounter: Payer: Self-pay | Admitting: Podiatry

## 2016-03-30 ENCOUNTER — Ambulatory Visit (INDEPENDENT_AMBULATORY_CARE_PROVIDER_SITE_OTHER): Payer: Medicaid Other | Admitting: Podiatry

## 2016-03-30 DIAGNOSIS — Q828 Other specified congenital malformations of skin: Secondary | ICD-10-CM | POA: Diagnosis not present

## 2016-03-30 NOTE — Progress Notes (Signed)
Subjective:     Patient ID: Stephanie Schultz, female   DOB: 11/18/1955, 60 y.o.   MRN: [id number]  HPI this patient presents to the office with chief complaint of a severely painful callus on the outside of her left forefoot. She also says she has a painful callus at the tip of her fifth toe right foot. She states both these areas are painful walking and wearing shoes. She is unable to self treat and she presents the office today for preventative foot care services. Patient is a type II diabetic with neuropathy   Review of Systems     Objective:   Physical Exam GENERAL APPEARANCE: Alert, conversant. Appropriately groomed. No acute distress.  VASCULAR: Pedal pulses are  palpable at  DP and PT bilateral.  Capillary refill time is immediate to all digits,  Normal temperature gradient.  Digital hair growth is present bilateral  NEUROLOGIC: sensation is normal to 5.07 monofilament at 5/5 sites bilateral.  Light touch is intact bilateral, Muscle strength normal.  MUSCULOSKELETAL: acceptable muscle strength, tone and stability bilateral.  Intrinsic muscluature intact bilateral.  Rectus appearance of foot and digits noted bilateral.   DERMATOLOGIC: skin color, texture, and turgor are within normal limits.  No preulcerative lesions or ulcers  are seen, no interdigital maceration noted.  No open lesions present.  Digital nails are asymptomatic. No drainage noted..  Porokeratosis sub 5th metatarsal left foot.  Listers corn fifth toe right foot.   .     Assessment:     Porokeratosis   Sub 5th met left foot. Hammer toe 5th right     Plan:     Debridement of porokeratosis /corn.  RTC 3 months.      Gregory Mayer DPM       

## 2016-04-06 ENCOUNTER — Ambulatory Visit (INDEPENDENT_AMBULATORY_CARE_PROVIDER_SITE_OTHER): Payer: Medicaid Other | Admitting: Adult Health

## 2016-04-06 ENCOUNTER — Encounter: Payer: Self-pay | Admitting: Adult Health

## 2016-04-06 VITALS — BP 132/78 | HR 85 | Temp 98.6°F | Ht 60.0 in | Wt 236.0 lb

## 2016-04-06 DIAGNOSIS — G4733 Obstructive sleep apnea (adult) (pediatric): Secondary | ICD-10-CM

## 2016-04-06 DIAGNOSIS — J9611 Chronic respiratory failure with hypoxia: Secondary | ICD-10-CM

## 2016-04-06 DIAGNOSIS — J441 Chronic obstructive pulmonary disease with (acute) exacerbation: Secondary | ICD-10-CM | POA: Diagnosis not present

## 2016-04-06 NOTE — Assessment & Plan Note (Signed)
Wear CPAP At bedtime   Work on weight loss.  Follow up [name]  In 1 month and As needed   Please contact office for sooner follow up if symptoms do not improve or worsen or seek emergency care

## 2016-04-06 NOTE — Assessment & Plan Note (Signed)
Wear Oxygen 2l/m rest and 3l/m with walking  Wear CPAP At bedtime   Work on weight loss.  Follow up [name]  In 1 month and As needed   Please contact office for sooner follow up if symptoms do not improve or worsen or seek emergency care

## 2016-04-06 NOTE — Patient Instructions (Addendum)
Stop Ipratropium neb.  Continue on Spriva and Symbicort  Use Albuterol neb As needed  Wheezing .  Wear Oxygen 2l/m rest and 3l/m with walking  Wear CPAP At bedtime   Work on weight loss.  Follow up [name]  In 1 month and As needed   Please contact office for sooner follow up if symptoms do not improve or worsen or seek emergency care

## 2016-04-06 NOTE — Assessment & Plan Note (Signed)
Recent flare now resolving   Plan  Stop Ipratropium neb.  Continue on Spriva and Symbicort  Use Albuterol neb As needed  Wheezing .  Wear Oxygen 2l/m rest and 3l/m with walking  Follow up [name]  In 1 month and As needed   Please contact office for sooner follow up if symptoms do not improve or worsen or seek emergency care

## 2016-04-06 NOTE — Progress Notes (Signed)
Subjective:    Patient ID: Stephanie Schultz, female    DOB: 01-Jan-1956, 60 y.o.   MRN: [id number]  HPI 60 yo female former smoker with COPD with asthma component and o2 dependent    04/06/2016 Acute OV   : COPD w/ asthma component, OSA on CPAP  Presents for an acute office visit. Complains of  increased SOB with activity, dry cough, chest congestion/tightness, chills and and low grade fever at bedtime.  Denies any sinus pressure/drainage, nausea or vomiting. No discolored mucus or chest pain. No calf pain or swelling.  Recent hospitalization 03/24/16 for COPD exacerbation. tx w/ abx, steroids and nebs.  She is better but still feels weak. Feels overwhelmed with sickness . Very tearful during exam  Support provided.  Remains on Spiriva and Symbicort  On O2 at 2l/m .  Walking in office needs 3l/m to keep sats >90%.  Patient denies any chest pain, orthopnea, PND, leg swelling, or fever.  Is mad and frustrated that we did not that we did not see her in the hospital  Tried to explain to her how the hospital consults work .   Furosemide 20mg  daily added yesterday at PCP. Does feel this is going to help .  Wt trending up for last 6 months .   Wearing CPAP At bedtime  . No mask issues.     Past Medical History  Diagnosis Date  . Hypertension   . Anxiety   . OSA on CPAP     CPAP at night   . Dizziness     pt believes this is motion sickness or vertigo  . GERD (gastroesophageal reflux disease)   . Headache(784.0)   . Cancer ([place]) 1990    cervical   . Anemia   . Morbid obesity ([place])   . Sickle cell trait ([place])   . Hx of cardiac catheterization     a. LHC at [facility] in [place], [place] 09/2008:  Normal coronary arteries EF 70%.  . Tobacco abuse     a. up to 3ppd from age 38 to 75, now 1/4 ppd (01/2013)  . Shortness of breath   . Pneumonia   . Diabetes mellitus without complication ([place])   . Emphysema   . COPD (chronic obstructive pulmonary disease) ([place])     dR . ROBERT BYOUM  .  Hyperlipidemia   . Supplemental oxygen dependent     2L CONTINUOUSLEY  . Numbness     FINGER TIPS  . Arthritis   . Gout   . Gallstones    Current Outpatient Prescriptions on File Prior to Visit  Medication Sig Dispense Refill  . albuterol (PROVENTIL) (2.5 MG/3ML) 0.083% nebulizer solution Take 3 mLs (2.5 mg total) by nebulization 4 (four) times daily. DX J44.9 (Patient taking differently: Take 2.5 mg by nebulization every 6 (six) hours as needed for wheezing or shortness of breath. DX J44.9) 360 mL 2  . allopurinol (ZYLOPRIM) 100 MG tablet Take 100 mg by mouth daily.    Marland Kitchen ALPRAZolam (XANAX) 0.5 MG tablet Take 1 tablet (0.5 mg total) by mouth 2 (two) times daily as needed for anxiety. 10 tablet 0  . budesonide-formoterol (SYMBICORT) 160-4.5 MCG/ACT inhaler Inhale 2 puffs into the lungs 2 (two) times daily. 1 Inhaler 6  . docusate sodium (COLACE) 100 MG capsule Take 2 capsules (200 mg total) by mouth daily. (Patient taking differently: Take 200 mg by mouth daily as needed for mild constipation. ) 10 capsule 0  . fluticasone (FLONASE) 50 MCG/ACT nasal  spray Place 1 spray into both nostrils daily. 16 g 0  . glipiZIDE (GLUCOTROL) 10 MG tablet Take 10 mg by mouth 2 (two) times daily before a meal.     . guaiFENesin (MUCINEX) 600 MG 12 hr tablet Take 1 tablet (600 mg total) by mouth 2 (two) times daily. 30 tablet 0  . HYDROcodone-acetaminophen (NORCO) 7.5-325 MG per tablet Take 1 tablet by mouth every 4 (four) hours as needed for moderate pain or severe pain. (Patient taking differently: Take 1 tablet by mouth every 8 (eight) hours as needed for moderate pain or severe pain. ) 10 tablet 0  . ipratropium (ATROVENT) 0.02 % nebulizer solution USE 1 VIAL IN NEBULIZER EVERY 6 HOURS AS NEEDED FOR WHEEZING OR SHORTNESS OF BREATH. 300 mL 5  . Linaclotide (LINZESS) 145 MCG CAPS capsule Take 145 mcg by mouth daily as needed (CONSTIPATION).     Marland Kitchen loratadine (CLARITIN) 10 MG tablet Take 10 mg by mouth daily.     Marland Kitchen nystatin cream (MYCOSTATIN) Apply 1 application topically 2 (two) times daily. Yeast infection    . Olopatadine HCl (PAZEO) 0.7 % SOLN Apply 1 drop to eye daily.    . pantoprazole (PROTONIX) 40 MG tablet Take 2 tablets (80 mg total) by mouth daily at 12 noon. 30 tablet 0  . pioglitazone (ACTOS) 30 MG tablet Take 30 mg by mouth daily.  0  . polyethylene glycol (MIRALAX / GLYCOLAX) packet Take 17 g by mouth daily. 14 each 0  . pravastatin (PRAVACHOL) 20 MG tablet Take 20 mg by mouth daily.    . pregabalin (LYRICA) 75 MG capsule Take 75 mg by mouth 2 (two) times daily.    Marland Kitchen PROAIR HFA 108 (90 Base) MCG/ACT inhaler INHALE 2 PUFFS BY MOUTH EVERY 4 HOURS AS NEEDED FOR WHEEZING 8.5 g 5  . tiotropium (SPIRIVA) 18 MCG inhalation capsule Place 1 capsule (18 mcg total) into inhaler and inhale daily. 30 capsule 6  . triamterene-hydrochlorothiazide (DYAZIDE) 37.5-25 MG per capsule Take 1 capsule by mouth daily.    Marland Kitchen zolpidem (AMBIEN) 10 MG tablet Take 10 mg by mouth at bedtime.     No current facility-administered medications on file prior to visit.     ROS  Constitutional:   No  weight loss, night sweats,  Fevers, chills,  +fatigue, or  lassitude.  HEENT:   No headaches,  Difficulty swallowing,  Tooth/dental problems, or  Sore throat,                No sneezing, itching, ear ache,  +nasal congestion, post nasal drip,   CV:  No chest pain,  Orthopnea, PND, swelling in lower extremities, anasarca, dizziness, palpitations, syncope.   GI  No heartburn, indigestion, abdominal pain, nausea, vomiting, diarrhea, change in bowel habits, loss of appetite, bloody stools.   Resp:   No chest wall deformity  Skin: no rash or lesions.  GU: no dysuria, change in color of urine, no urgency or frequency.  No flank pain, no hematuria   MS:  No joint pain or swelling.  No decreased range of motion.  No back pain.  Psych: +tearful         Objective:   Physical Exam Filed Vitals:   04/06/16 0948   BP: 132/78  Pulse: 85  Temp: 98.6 F (37 C)  TempSrc: Oral  Height: 5' (1.524 m)  Weight: 236 lb (107.049 kg)  SpO2: 94%   Body mass index is 46.09 kg/(m^2).     Gen:  Obese, in no distress,  normal affect  ENT: No lesions,  mouth clear,  oropharynx clear, no postnasal drip  Neck: No JVD, no TMG, no carotid bruits, no stridor   Lungs: No use of accessory muscles, distant, no wheezes   Cardiovascular: RRR, heart sounds normal, no murmur or gallops, no peripheral edema  Musculoskeletal: No deformities, no cyanosis or clubbing  Neuro: alert, non focal  Skin: Warm, no lesions or rashes, old scars on B UE's  Bellah Alia NP-C  [facility]  04/06/2016

## 2016-04-09 NOTE — Progress Notes (Signed)
Cardiology Office Note:    Date:  04/10/2016   ID:  Mikal Plane, DOB 1956/05/16, MRN [id number]  PCP:  Barbette Merino, MD  Cardiologist:  Dr.  Bing Quarry >> Dr. Fransico Him   Electrophysiologist:  N/a Pulmonologist: [name] GI:  Dr. Benson Norway  Referring MD: [name], MD   Chief Complaint  Patient presents with  . Hospitalization Follow-up    History of Present Illness:     Stephanie Schultz is a 60 y.o. female with a hx of DM2, HTN, COPD on chronic O2, OSA on CPAP, prior tobacco abuse. LHC at [facility] in [place], [place] 09/2008: Normal coronary arteries EF 70%. Patient noted exertional chest and left arm squeezing while her O2 was off and was seen by cardiology. Echo 01/15/13: Mod LVH, EF 65-70%, normal wall motion. Last seen in 02/2013.    Admitted 5/11-5/13 with AECOPD.  She was tx with a combination of steroids, bronchodilators, antibiotics.  CEs remained negative.  BNP was normal.  CXR suggested vascular congestion.    Referred back by her PCP for CHF.  She was placed on Lasix 20 mg QD last week.  She has chronic DOE.  She is NYHA 3.  She wears O2 chronically at 2.5 LPM. She has a lot of indigestion and abdominal bloating.  She has a long hx of constipation.  She is on Linzess.  She recently increased her PPI.  She notes chest discomfort with anxiety and with exertion.  Notes symptoms with nausea, diaphoresis, L arm pain.  She sleeps on an incline chronically and sleeps with CPAP.  Denies syncope. She has a chronic NP cough.     Past Medical History  Diagnosis Date  . Hypertension   . Anxiety   . OSA on CPAP     CPAP at night   . GERD (gastroesophageal reflux disease)   . Cancer ([place]) 1990    cervical   . Anemia     chronis  . Morbid obesity ([place])   . Sickle cell trait ([place])   . Hx of cardiac catheterization     a. LHC at [facility] in [place], [place] 09/2008:  Normal coronary arteries EF 70%.  . Tobacco abuse     a. up to 3ppd from age 33 to 19, now 1/4 ppd (01/2013) >> Quit  10/2015  . Pneumonia   . Diabetes mellitus without complication ([place])     a. A1c 8.3 in 11/2015  . Emphysema   . COPD (chronic obstructive pulmonary disease) ([facility])     Pulmo: [name]  . Hyperlipidemia   . Supplemental oxygen dependent     2L CONTINUOUSLEY  . Arthritis   . Gout   . Gallstones     s/p cholecystectomy    Past Surgical History  Procedure Laterality Date  . Tubal ligation    . Cardiac catheterization    . Colonoscopy  09/05/2012    Procedure: COLONOSCOPY;  Surgeon: Beryle Beams, MD;  Location: [facility] ENDOSCOPY;  Service: Endoscopy;  Laterality: N/A;  . Colonoscopy with propofol N/A 09/02/2015    Procedure: COLONOSCOPY WITH PROPOFOL;  Surgeon: Carol Ada, MD;  Location: [facility] ENDOSCOPY;  Service: Endoscopy;  Laterality: N/A;  . Cholecystectomy N/A 12/13/2015    Procedure: LAPAROSCOPIC CHOLECYSTECTOMY;  Surgeon: Ralene Ok, MD;  Location: [facility] ORS;  Service: General;  Laterality: N/A;    Current Medications: Outpatient Prescriptions Prior to Visit  Medication Sig Dispense Refill  . allopurinol (ZYLOPRIM) 100 MG tablet Take 100 mg by mouth daily.    Marland Kitchen  ALPRAZolam (XANAX) 0.5 MG tablet Take 1 tablet (0.5 mg total) by mouth 2 (two) times daily as needed for anxiety. 10 tablet 0  . budesonide-formoterol (SYMBICORT) 160-4.5 MCG/ACT inhaler Inhale 2 puffs into the lungs 2 (two) times daily. 1 Inhaler 6  . fluticasone (FLONASE) 50 MCG/ACT nasal spray Place 1 spray into both nostrils daily. 16 g 0  . furosemide (LASIX) 20 MG tablet Take 20 mg by mouth daily.    Marland Kitchen glipiZIDE (GLUCOTROL) 10 MG tablet Take 10 mg by mouth 2 (two) times daily before a meal.     . guaiFENesin (MUCINEX) 600 MG 12 hr tablet Take 1 tablet (600 mg total) by mouth 2 (two) times daily. 30 tablet 0  . Linaclotide (LINZESS) 145 MCG CAPS capsule Take 145 mcg by mouth daily as needed (CONSTIPATION).     Marland Kitchen loratadine (CLARITIN) 10 MG tablet Take 10 mg by mouth daily.    Marland Kitchen nystatin cream (MYCOSTATIN) Apply 1  application topically 2 (two) times daily. Yeast infection    . Olopatadine HCl (PAZEO) 0.7 % SOLN Place 1 drop into both eyes daily.     . pantoprazole (PROTONIX) 40 MG tablet Take 2 tablets (80 mg total) by mouth daily at 12 noon. 30 tablet 0  . pioglitazone (ACTOS) 30 MG tablet Take 30 mg by mouth daily.  0  . pravastatin (PRAVACHOL) 20 MG tablet Take 20 mg by mouth daily.    . pregabalin (LYRICA) 75 MG capsule Take 75 mg by mouth 2 (two) times daily.    Marland Kitchen PROAIR HFA 108 (90 Base) MCG/ACT inhaler INHALE 2 PUFFS BY MOUTH EVERY 4 HOURS AS NEEDED FOR WHEEZING (Patient taking differently: INHALE 2 PUFFS BY MOUTH EVERY 4 HOURS AS NEEDED FOR  WHEEZING) 8.5 g 5  . tiotropium (SPIRIVA) 18 MCG inhalation capsule Place 1 capsule (18 mcg total) into inhaler and inhale daily. 30 capsule 6  . triamterene-hydrochlorothiazide (DYAZIDE) 37.5-25 MG per capsule Take 1 capsule by mouth daily.    Marland Kitchen zolpidem (AMBIEN) 10 MG tablet Take 10 mg by mouth at bedtime.    Marland Kitchen albuterol (PROVENTIL) (2.5 MG/3ML) 0.083% nebulizer solution Take 3 mLs (2.5 mg total) by nebulization 4 (four) times daily. DX J44.9 (Patient taking differently: Take 2.5 mg by nebulization every 6 (six) hours as needed for wheezing or shortness of breath. DX J44.9) 360 mL 2  . docusate sodium (COLACE) 100 MG capsule Take 2 capsules (200 mg total) by mouth daily. (Patient not taking: Reported on 04/10/2016) 10 capsule 0  . HYDROcodone-acetaminophen (NORCO) 7.5-325 MG per tablet Take 1 tablet by mouth every 4 (four) hours as needed for moderate pain or severe pain. (Patient not taking: Reported on 04/10/2016) 10 tablet 0  . polyethylene glycol (MIRALAX / GLYCOLAX) packet Take 17 g by mouth daily. (Patient not taking: Reported on 04/10/2016) 14 each 0   No facility-administered medications prior to visit.      Allergies:   Acetaminophen   Social History   Social History  . Marital Status: Single    Spouse Name: N/A  . Number of Children: 3  . Years  of Education: 11th   Occupational History  . Disabled     Emphysema   Social History Main Topics  . Smoking status: Former Smoker -- 0.50 packs/day for 36 years    Types: Cigarettes    Quit date: 11/11/2015  . Smokeless tobacco: Never Used     Comment: Approx 90 pk-yrs (up to 3ppd until ~ 2009). Smoking  3 cigs per day now.  . Alcohol Use: No  . Drug Use: No  . Sexual Activity: Yes   Other Topics Concern  . None   Social History Narrative   From [place], [place].  Moved to [place] about 8 years ago to be closer to her daughter. She moved back to [place] about a year and a half ago. She lives by herself. She does not routinely exercise or adhere to any particular diet.   Caffeine Use: 1-2 cups daily   Disabled; Previously in home care     Family History:  The patient's family history includes Diabetes in her mother; Lung cancer in her paternal aunt and paternal grandfather; Myasthenia gravis in her mother; Other in her father. There is no history of Heart attack or Heart failure.   ROS:   Please see the history of present illness.    Review of Systems  Cardiovascular: Positive for chest pain.  Respiratory: Positive for shortness of breath.   Gastrointestinal: Positive for constipation, heartburn and nausea.  Genitourinary: Negative for hematuria.   All other systems reviewed and are negative.   Physical Exam:    VS:  BP 110/62 mmHg  Pulse 84  Ht 5' (1.524 m)  Wt 236 lb 6.4 oz (107.23 kg)  BMI 46.17 kg/m2  SpO2 97%  LMP 06/04/2000 (LMP Unknown)   GEN: Well nourished, well developed, in no acute distress HEENT: normal Neck: I cannot assess JVD, no masses Cardiac: Normal S1/S2, RRR; no murmurs, rubs, or gallops, no edema;  no carotid bruits,   Respiratory:  clear to auscultation bilaterally; no wheezing, rhonchi or rales GI:   nontender,  distended MS: no deformity or atrophy Skin: warm and dry Neuro: No focal deficits  Psych: Alert and oriented x 3, normal  affect   Wt Readings from Last 3 Encounters:  04/10/16 236 lb 6.4 oz (107.23 kg)  04/06/16 236 lb (107.049 kg)  03/23/16 234 lb 3.2 oz (106.232 kg)     Studies/Labs Reviewed:     EKG:  EKG is   ordered today.  The ekg ordered today demonstrates NSR, HR 78, normal axis, NSSTTW changes, QTc 444 ms  Recent Labs: 01/24/2016: ALT 37 03/23/2016: B Natriuretic Peptide 8.8; Hemoglobin 10.6*; Platelets 234 03/24/2016: BUN 22*; Creatinine, Ser 1.12*; Potassium 4.8; Sodium 145   Recent Lipid Panel    Component Value Date/Time   CHOL 201* 04/26/2013 2333   TRIG 160* 04/26/2013 2333   HDL 81 04/26/2013 2333   CHOLHDL 2.5 04/26/2013 2333   VLDL 32 04/26/2013 2333   LDLCALC 88 04/26/2013 2333  Labs from PCP (11/16):  TC 167, TG 233, LDL 81, HDL 39   Additional studies/ records that were reviewed today include:   Echo 12/16/15 Suboptimal, Definity contrast used, mild LVH, EF 60-65%, no RWMA, Gr 1 DD    ASSESSMENT:     1. Diastolic dysfunction   2. Other chest pain   3. Controlled type 2 diabetes mellitus without complication, without long-term current use of insulin ([place])   4. Essential hypertension   5. Chronic obstructive pulmonary disease, unspecified COPD type ([place])   6. Obstructive sleep apnea   7. Constipation, unspecified constipation type     PLAN:     In order of problems listed above:  1. Diastolic dysfunction - She is referred back for possible CHF.  She has mild LVH and mild diastolic dysfunction on recent echo.  She may have an element of diastolic CHF contributing.  However,  overall, her symptoms and clinical picture are not highly suggestive of CHF as a primary cause.  I suspect her COPD is the major contributor to her dyspnea. CXR in the hospital did demonstrate vascular congestion but her BNP has been normal.  She has abdominal distention but has a long hx of severe constipation, which I believe is contributing to her symptoms.  She was just placed on Lasix 20 mg QD by  her PCP.  It is reasonable to continue this drug to see how she responds.  If she has progressive symptoms in the future without clear clinical evidence of CHF, may need to consider RHC.  -  Continue Lasix  -  BMET 1 week  -  FU with PCP regarding Actos.   2. Chest pain - She has typical and atypical symptoms. LHC in 2009 was normal.  She has significant CRFs including DM.  However, her body habitus will make stress testing problematic.  A nuclear stress study will likely have poor images and would raise the risk of false +.  I reviewed her case with Dr. Allegra Lai in the office today.  Will try her on low dose nitrates to see how she responds.  Start Imdur 15 mg QD.  If symptoms progress without other clear cause, may need to consider cardiac cath.    3.  Diabetes - FU with PCP for further management.  As noted above, CHF may be playing a small role in her symptoms.  In any event, we should strongly consider avoiding Actos in this case.  I have asked her to FU with her PCP to discuss stopping Actos.   4. HTN - Controlled.   5. COPD - FU with Pulmonology as planned.  6. OSA - Continue CPAP.  She may also have OHS.    7. Constipation - I have asked her to FU with GI as her GERD is likely contributing to her CP and her constipation is contributing to her abdominal distention.    Medication Adjustments/Labs and Tests Ordered: Current medicines are reviewed at length with the patient today.  Concerns regarding medicines are outlined above.  Medication changes, Labs and Tests ordered today are outlined in the Patient Instructions noted below. Patient Instructions  Medication Instructions:  1. START IMDUR 30 MG TABLET WITH THE DIRECTIONS TO READ : TAKE 1/2 TABLET DAILY = 15 MG DAILY Labwork: BMET TO BE DONE IN 1 WEEK Testing/Procedures: NONE Follow-Up: DR. Radford Pax ON 07/04/16 @ 8:15 Any Other Special Instructions Will Be Listed Below (If Applicable). 1. YOU WILL NEED TO CALL GI FOR AN OFFICE  VISIT FOR GERD, CONSTIPATION 2. FOLLOW UP WITH PRIMARY CARE TO CONSIDER ALTERNATIVE TO ACTOS 3. MAKE SURE TO WEIGH ON A DAILY BASIS AND CALL THE OFFICE [phone] IF YOUR WEIGHT IS UP 3 LB'S IN 1 DAY If you need a refill on your cardiac medications before your next appointment, please call your pharmacy.    Signed, Richardson Dopp, PA-C  04/10/2016 10:09 AM    [facility] [place], [place], [place]  [address] Phone: [phone]; Fax: [phone]

## 2016-04-10 ENCOUNTER — Ambulatory Visit (INDEPENDENT_AMBULATORY_CARE_PROVIDER_SITE_OTHER): Payer: Medicaid Other | Admitting: Physician Assistant

## 2016-04-10 ENCOUNTER — Other Ambulatory Visit: Payer: Self-pay | Admitting: *Deleted

## 2016-04-10 ENCOUNTER — Encounter: Payer: Self-pay | Admitting: Physician Assistant

## 2016-04-10 VITALS — BP 110/62 | HR 84 | Ht 60.0 in | Wt 236.4 lb

## 2016-04-10 DIAGNOSIS — I519 Heart disease, unspecified: Secondary | ICD-10-CM | POA: Diagnosis not present

## 2016-04-10 DIAGNOSIS — I10 Essential (primary) hypertension: Secondary | ICD-10-CM

## 2016-04-10 DIAGNOSIS — R0789 Other chest pain: Secondary | ICD-10-CM

## 2016-04-10 DIAGNOSIS — G4733 Obstructive sleep apnea (adult) (pediatric): Secondary | ICD-10-CM | POA: Diagnosis not present

## 2016-04-10 DIAGNOSIS — J449 Chronic obstructive pulmonary disease, unspecified: Secondary | ICD-10-CM

## 2016-04-10 DIAGNOSIS — K5900 Constipation, unspecified: Secondary | ICD-10-CM

## 2016-04-10 DIAGNOSIS — I5189 Other ill-defined heart diseases: Secondary | ICD-10-CM

## 2016-04-10 DIAGNOSIS — E119 Type 2 diabetes mellitus without complications: Secondary | ICD-10-CM

## 2016-04-10 MED ORDER — ISOSORBIDE MONONITRATE ER 30 MG PO TB24: 15.0000 mg | ORAL_TABLET | Freq: Every day | ORAL | Status: DC

## 2016-04-10 NOTE — Patient Instructions (Addendum)
Medication Instructions:  1. START IMDUR 30 MG TABLET WITH THE DIRECTIONS TO READ : TAKE 1/2 TABLET DAILY = 15 MG DAILY Labwork: BMET TO BE DONE IN 1 WEEK Testing/Procedures: NONE Follow-Up: DR. Radford Pax ON 07/04/16 @ 8:15 Any Other Special Instructions Will Be Listed Below (If Applicable). 1. YOU WILL NEED TO CALL GI FOR AN OFFICE VISIT FOR GERD, CONSTIPATION 2. FOLLOW UP WITH PRIMARY CARE TO CONSIDER ALTERNATIVE TO ACTOS 3. MAKE SURE TO WEIGH ON A DAILY BASIS AND CALL THE OFFICE [phone] IF YOUR WEIGHT IS UP 3 LB'S IN 1 DAY If you need a refill on your cardiac medications before your next appointment, please call your pharmacy.

## 2016-04-10 NOTE — Telephone Encounter (Signed)
NEW RX FOR IMDUR 15 MG DAILY

## 2016-04-17 ENCOUNTER — Other Ambulatory Visit: Payer: Medicaid Other

## 2016-04-18 ENCOUNTER — Telehealth: Payer: Self-pay | Admitting: Physician Assistant

## 2016-04-18 NOTE — Telephone Encounter (Signed)
Spoke with Amy.  She was in the pt's home.  Pt has not been able to get to the office for blood work that was ordered and Amy can draw it while there today.  Verbal order given for BMP as previously ordered by Richardson Dopp, PA.

## 2016-04-18 NOTE — Telephone Encounter (Signed)
New message    Amy is calling for an order for labs, she is with [facility].  She wants to obtain labs at pt home

## 2016-04-20 ENCOUNTER — Other Ambulatory Visit: Payer: Medicaid Other

## 2016-04-26 ENCOUNTER — Telehealth: Payer: Self-pay | Admitting: Cardiology

## 2016-04-26 MED ORDER — AMLODIPINE BESYLATE 2.5 MG PO TABS: 2.5000 mg | ORAL_TABLET | Freq: Every day | ORAL | Status: DC

## 2016-04-26 NOTE — Telephone Encounter (Signed)
If HAs are debilitating, DC Imdur and start Amlodipine 2.5 mg QD. If HAs are bearable, it is reasonable to see if they will resolve over the next 2 weeks. Richardson Dopp, PA-C   04/26/2016 5:12 PM

## 2016-04-26 NOTE — Telephone Encounter (Signed)
Spoke with pt as [facility] nurse is longer with pt.  Pt states that since starting the Imdur she has had a HA off and on. She will go 3-4 without a HA and then will have a HA for 3-4 days. Pt has taken Ibuprofen once and Tylenol a few times, with no relief. Advised pt that HA's are an unfortunate side effect of this medication but normally HA improves after a short period of time. Pt states BP today was 130/70 with [place]. Pt states she has not had any CP since starting the Imdur. Advised pt that I would send this information to Richardson Dopp, PA-C for review and advisement.

## 2016-04-26 NOTE — Telephone Encounter (Signed)
Spoke with pt and gave her information from [place], [place]. She states headaches are bad and she would like to change medications.  I gave her instructions to stop Imdur and start Amlodipine 2.5 mg by mouth daily.  Will send prescription to [facility] on [address].

## 2016-04-26 NOTE — Telephone Encounter (Signed)
New message       Pt c/o medication issue:  1. Name of Medication: Imdur  2. How are you currently taking this medication (dosage and times per day)? 30 mg po taking 1/2 tablet daily  3. Are you having a reaction (difficulty breathing--STAT)? no  4. What is your medication issue? [facility] is there with the pt and the pt is complaining of headache, the pt is stating the headache is lasting all day when she has, and seems to think it's the medication, the pt states to the nurse if she hold the neck a certain way the headache kinda goes away. The nurse is saying this didn't start having the headache until the medication was started. The pt stated to nurse this headache has been going on for about a week since she has been on the medication.

## 2016-05-08 ENCOUNTER — Ambulatory Visit: Payer: Medicaid Other | Admitting: Emergency Medicine

## 2016-05-09 ENCOUNTER — Encounter: Payer: Self-pay | Admitting: Cardiology

## 2016-05-18 ENCOUNTER — Other Ambulatory Visit: Payer: Self-pay | Admitting: Emergency Medicine

## 2016-05-30 ENCOUNTER — Emergency Department (HOSPITAL_COMMUNITY)
Admission: EM | Admit: 2016-05-30 | Discharge: 2016-05-30 | Disposition: A | Discharge status: Home / Self Care | Payer: Medicaid Other | Attending: Internal Medicine | Admitting: Internal Medicine

## 2016-05-30 ENCOUNTER — Encounter (HOSPITAL_COMMUNITY): Payer: Self-pay

## 2016-05-30 DIAGNOSIS — Z859 Personal history of malignant neoplasm, unspecified: Secondary | ICD-10-CM | POA: Diagnosis not present

## 2016-05-30 DIAGNOSIS — J449 Chronic obstructive pulmonary disease, unspecified: Secondary | ICD-10-CM | POA: Diagnosis not present

## 2016-05-30 DIAGNOSIS — Z7984 Long term (current) use of oral hypoglycemic drugs: Secondary | ICD-10-CM | POA: Diagnosis not present

## 2016-05-30 DIAGNOSIS — E119 Type 2 diabetes mellitus without complications: Secondary | ICD-10-CM | POA: Insufficient documentation

## 2016-05-30 DIAGNOSIS — Z79899 Other long term (current) drug therapy: Secondary | ICD-10-CM | POA: Diagnosis not present

## 2016-05-30 DIAGNOSIS — M1990 Unspecified osteoarthritis, unspecified site: Secondary | ICD-10-CM | POA: Insufficient documentation

## 2016-05-30 DIAGNOSIS — E785 Hyperlipidemia, unspecified: Secondary | ICD-10-CM | POA: Diagnosis not present

## 2016-05-30 DIAGNOSIS — K529 Noninfective gastroenteritis and colitis, unspecified: Secondary | ICD-10-CM | POA: Insufficient documentation

## 2016-05-30 DIAGNOSIS — Z87891 Personal history of nicotine dependence: Secondary | ICD-10-CM | POA: Diagnosis not present

## 2016-05-30 DIAGNOSIS — I10 Essential (primary) hypertension: Secondary | ICD-10-CM | POA: Diagnosis not present

## 2016-05-30 DIAGNOSIS — R5383 Other fatigue: Secondary | ICD-10-CM | POA: Diagnosis not present

## 2016-05-30 DIAGNOSIS — R112 Nausea with vomiting, unspecified: Secondary | ICD-10-CM | POA: Diagnosis present

## 2016-05-30 LAB — COMPREHENSIVE METABOLIC PANEL
ALBUMIN: 4.3 g/dL (ref 3.5–5.0)
ALT: 42 U/L (ref 14–54)
AST: 45 U/L — AB (ref 15–41)
Alkaline Phosphatase: 35 U/L — ABNORMAL LOW (ref 38–126)
Anion gap: 9 (ref 5–15)
BUN: 17 mg/dL (ref 6–20)
CHLORIDE: 103 mmol/L (ref 101–111)
CO2: 28 mmol/L (ref 22–32)
Calcium: 9.3 mg/dL (ref 8.9–10.3)
Creatinine, Ser: 1.04 mg/dL — ABNORMAL HIGH (ref 0.44–1.00)
GFR calc Af Amer: >60 mL/min (ref >60)
GFR calc non Af Amer: 58 mL/min — ABNORMAL LOW (ref >60)
GLUCOSE: 134 mg/dL — AB (ref 65–99)
POTASSIUM: 3.6 mmol/L (ref 3.5–5.1)
Sodium: 140 mmol/L (ref 135–145)
Total Bilirubin: 0.5 mg/dL (ref 0.3–1.2)
Total Protein: 8.7 g/dL — ABNORMAL HIGH (ref 6.5–8.1)

## 2016-05-30 LAB — CBC
HEMATOCRIT: 36.8 % (ref 36.0–46.0)
Hemoglobin: 12.3 g/dL (ref 12.0–15.0)
MCH: 26.9 pg (ref 26.0–34.0)
MCHC: 33.4 g/dL (ref 30.0–36.0)
MCV: 80.3 fL (ref 78.0–100.0)
PLATELETS: 248 10*3/uL (ref 150–400)
RBC: 4.58 MIL/uL (ref 3.87–5.11)
RDW: 15.5 % (ref 11.5–15.5)
WBC: 15.1 10*3/uL — AB (ref 4.0–10.5)

## 2016-05-30 LAB — LIPASE, BLOOD: Lipase: 13 U/L (ref 11–51)

## 2016-05-30 MED ORDER — ONDANSETRON HCL 4 MG PO TABS: 4.0000 mg | ORAL_TABLET | Freq: Four times a day (QID) | ORAL | Status: DC | PRN

## 2016-05-30 MED ORDER — SODIUM CHLORIDE 0.9 % IV BOLUS (SEPSIS)
1000.0000 mL | Freq: Once | INTRAVENOUS | Status: AC
  Administered 2016-05-30: 1000 mL via INTRAVENOUS

## 2016-05-30 MED ORDER — GI COCKTAIL ~~LOC~~
30.0000 mL | Freq: Once | ORAL | Status: AC
  Administered 2016-05-30: 30 mL via ORAL
  Filled 2016-05-30: qty 30

## 2016-05-30 MED ORDER — ONDANSETRON HCL 4 MG/2ML IJ SOLN
4.0000 mg | Freq: Four times a day (QID) | INTRAMUSCULAR | Status: DC | PRN
  Administered 2016-05-30: 4 mg via INTRAVENOUS
  Filled 2016-05-30: qty 2

## 2016-05-30 NOTE — ED Notes (Signed)
She c/o few episodes of n/v/d since ~0900 today. She theorizes that some spaghetti she ate this morning was "bad".  She rec'd. IV Zofran en route to hospital.

## 2016-05-30 NOTE — ED Notes (Signed)
She arrives with her usual home dose of O2, which is 2.5 l.p.m., which we maintain.

## 2016-05-30 NOTE — ED Provider Notes (Signed)
CSN: [id number]     Arrival date & time 05/30/16  1520 History   First MD Initiated Contact with Patient 05/30/16 1522     Chief Complaint  Patient presents with  . Emesis  . Diarrhea     (Consider location/radiation/quality/duration/timing/severity/associated sxs/prior Treatment) HPI   This is a 60 year old female who presents to the ED for evaluation of nausea, vomiting and diarrhea that began at 6am this morning. The patient states she woke up this morning with chills and soon after had nausea and diarrhea. She states she's had 15+ episodes of watery foul smelling diarrhea today and has vomited 10+ times as well. She denies any blood or mucous in stool or recent antibiotic use. She noticed some streaks of blood in emesis during her last emesis.  The patient states that she ate some spaghetti last night that she cooked on Sunday that was made with beef and believes it was "bad." No one else in the family has eaten the spaghetti. She denies any sick contacts. She received IV Zofran en route to hospital and her nausea significantly improved.   Past Medical History  Diagnosis Date  . Hypertension   . Anxiety   . OSA on CPAP     CPAP at night   . GERD (gastroesophageal reflux disease)   . Cancer ([place]) 1990    cervical   . Anemia     chronis  . Morbid obesity ([place])   . Sickle cell trait ([place])   . Hx of cardiac catheterization     a. LHC at [facility] in [place], [place] 09/2008:  Normal coronary arteries EF 70%.  . Tobacco abuse     a. up to 3ppd from age 46 to 38, now 1/4 ppd (01/2013) >> Quit 10/2015  . Pneumonia   . Diabetes mellitus without complication ([place])     a. A1c 8.3 in 11/2015  . Emphysema   . COPD (chronic obstructive pulmonary disease) (HCC)     Pulmo: [name]  . Hyperlipidemia   . Supplemental oxygen dependent     2L CONTINUOUSLEY  . Arthritis   . Gout   . Gallstones     s/p cholecystectomy   Past Surgical History  Procedure Laterality Date  . Tubal ligation    .  Cardiac catheterization    . Colonoscopy  09/05/2012    Procedure: COLONOSCOPY;  Surgeon: Beryle Beams, MD;  Location: [facility];  Service: Endoscopy;  Laterality: N/A;  . Colonoscopy with propofol N/A 09/02/2015    Procedure: COLONOSCOPY WITH PROPOFOL;  Surgeon: Carol Ada, MD;  Location: [facility];  Service: Endoscopy;  Laterality: N/A;  . Cholecystectomy N/A 12/13/2015    Procedure: LAPAROSCOPIC CHOLECYSTECTOMY;  Surgeon: Ralene Ok, MD;  Location: [facility] ORS;  Service: General;  Laterality: N/A;   Family History  Problem Relation Age of Onset  . Lung cancer Paternal Aunt   . Lung cancer Paternal Grandfather   . Other Father     unaware of father's medical history  . Diabetes Mother     alive @ 57  . Myasthenia gravis Mother   . Other      multiple siblings a&w.  . Heart attack Neg Hx   . Heart failure Neg Hx    Social History  Substance Use Topics  . Smoking status: Former Smoker -- 0.50 packs/day for 36 years    Types: Cigarettes    Quit date: 11/11/2015  . Smokeless tobacco: Never Used     Comment:  Approx 90 pk-yrs (up to 3ppd until ~ 2009). Smoking 3 cigs per day now.  . Alcohol Use: No   OB History    No data available     Review of Systems  Constitutional: Positive for chills and fatigue. Negative for fever.  HENT: Negative for rhinorrhea, sneezing and sore throat.   Respiratory: Positive for cough. Negative for chest tightness and shortness of breath.   Cardiovascular: Negative for chest pain and leg swelling.  Gastrointestinal: Positive for nausea, vomiting, abdominal pain and diarrhea. Negative for blood in stool and abdominal distention.  Genitourinary: Negative for dysuria, flank pain and difficulty urinating.  Musculoskeletal: Negative for myalgias and neck stiffness.  Skin: Negative for rash.  Neurological: Positive for dizziness.      Allergies  Acetaminophen  Home Medications   Prior to Admission medications   Medication Sig Start  Date End Date Taking? Authorizing Provider  albuterol (PROVENTIL) (2.5 MG/3ML) 0.083% nebulizer solution Take 2.5 mg by nebulization every 6 (six) hours as needed for wheezing or shortness of breath.    Historical Provider, MD  allopurinol (ZYLOPRIM) 100 MG tablet Take 100 mg by mouth daily.    Historical Provider, MD  ALPRAZolam Duanne Moron) 0.5 MG tablet Take 1 tablet (0.5 mg total) by mouth 2 (two) times daily as needed for anxiety. 02/22/15   Bonnielee Haff, MD  amLODipine (NORVASC) 2.5 MG tablet Take 1 tablet (2.5 mg total) by mouth daily. 04/26/16   Liliane Shi, PA-C  budesonide-formoterol (SYMBICORT) 160-4.5 MCG/ACT inhaler Inhale 2 puffs into the lungs 2 (two) times daily. 05/03/15   Collene Gobble, MD  fluticasone (FLONASE) 50 MCG/ACT nasal spray Place 1 spray into both nostrils daily. 03/24/16   Belkys A Regalado, MD  fluticasone (FLONASE) 50 MCG/ACT nasal spray INHALE 2 SPRAYS IN EACH NOSTRIL ONCE DAILY 05/18/16   Collene Gobble, MD  furosemide (LASIX) 20 MG tablet Take 20 mg by mouth daily.    Historical Provider, MD  glipiZIDE (GLUCOTROL) 10 MG tablet Take 10 mg by mouth 2 (two) times daily before a meal.     Historical Provider, MD  guaiFENesin (MUCINEX) 600 MG 12 hr tablet Take 1 tablet (600 mg total) by mouth 2 (two) times daily. 03/24/16   Belkys A Regalado, MD  HYDROcodone-acetaminophen (NORCO) 7.5-325 MG tablet Take 1 tablet by mouth every 6 (six) hours as needed for moderate pain.    Historical Provider, MD  Linaclotide (LINZESS) 145 MCG CAPS capsule Take 145 mcg by mouth daily as needed (CONSTIPATION).     Historical Provider, MD  loratadine (CLARITIN) 10 MG tablet Take 10 mg by mouth daily.    Historical Provider, MD  nystatin cream (MYCOSTATIN) Apply 1 application topically 2 (two) times daily. Yeast infection    Historical Provider, MD  Olopatadine HCl (PAZEO) 0.7 % SOLN Place 1 drop into both eyes daily.     Historical Provider, MD  pantoprazole (PROTONIX) 40 MG tablet Take 2 tablets  (80 mg total) by mouth daily at 12 noon. 03/24/16   Belkys A Regalado, MD  pioglitazone (ACTOS) 30 MG tablet Take 30 mg by mouth daily. 12/29/15   Historical Provider, MD  pravastatin (PRAVACHOL) 20 MG tablet Take 20 mg by mouth daily.    Historical Provider, MD  pregabalin (LYRICA) 75 MG capsule Take 75 mg by mouth 2 (two) times daily.    Historical Provider, MD  PROAIR HFA 108 (90 Base) MCG/ACT inhaler INHALE 2 PUFFS BY MOUTH EVERY 4 HOURS AS NEEDED FOR  WHEEZING Patient taking differently: INHALE 2 PUFFS BY MOUTH EVERY 4 HOURS AS NEEDED FOR  WHEEZING 12/19/15   Collene Gobble, MD  tiotropium (SPIRIVA) 18 MCG inhalation capsule Place 1 capsule (18 mcg total) into inhaler and inhale daily. 05/03/15   Collene Gobble, MD  triamterene-hydrochlorothiazide (DYAZIDE) 37.5-25 MG per capsule Take 1 capsule by mouth daily.    Historical Provider, MD  zolpidem (AMBIEN) 10 MG tablet Take 10 mg by mouth at bedtime.    Historical Provider, MD   BP 122/59 mmHg  Pulse 110  Temp(Src) 98.8 F (37.1 C) (Oral)  SpO2 90%  LMP 06/04/2000 (LMP Unknown) Physical Exam  Constitutional: She appears well-developed and well-nourished. No distress.  HENT:  Head: Normocephalic and atraumatic.  Eyes: Pupils are equal, round, and reactive to light.  Cardiovascular: Regular rhythm.   Tachycardic, rate 106  Pulmonary/Chest: Effort normal. She has no wheezes. She has no rales.  Abdominal: Soft. She exhibits no distension. There is generalized tenderness. There is no rebound, no guarding and no tenderness at McBurney's point.  Skin: Skin is warm and dry.    ED Course  Procedures (including critical care time) Labs Review Labs Reviewed  CBC - Abnormal; Notable for the following:    WBC 15.1 (*)    All other components within normal limits  COMPREHENSIVE METABOLIC PANEL  LIPASE, BLOOD    Imaging Review No results found. I have personally reviewed and evaluated these images and lab results as part of my medical  decision-making.   EKG Interpretation   Date/Time:  Wednesday May 30 2016 17:25:29 EDT Ventricular Rate:  105 PR Interval:    QRS Duration: 93 QT Interval:  355 QTC Calculation: 470 R Axis:   59 Text Interpretation:  Sinus tachycardia No significant change since last  tracing Confirmed by FLOYD MD, Quillian Quince [id number]) on 05/30/2016 5:30:01 PM       MDM   Final diagnoses:  None   IVF ordered. Zofran given by EMS effective for nausea. EKG, CMP, BMP and lipase ordered as well.  Patient denies any blood or mucous in stool and denied recent antibiotic use.  GI cocktail given. Patient tolerated PO challenge.  Lipase negative. Patient to be discharged home with PO zofran and to follow up with PCP in 1 week. Patient to return if symptoms worsen.    Roby Donaway, DO 05/30/16 [place], DO 05/30/16 2106

## 2016-05-30 NOTE — ED Notes (Signed)
Pt waiting on PTAR  Pt given ginger ale and crackers

## 2016-05-30 NOTE — Discharge Instructions (Signed)
Follow up with PCP in 1 week. Please return to ED if symptoms worsen.   Colitis Colitis is inflammation of the colon. Colitis may last a short time (acute) or it may last a long time (chronic). CAUSES This condition may be caused by:  Viruses.  Bacteria.  Reactions to medicine.  Certain autoimmune diseases, such as Crohn disease or ulcerative colitis. SYMPTOMS Symptoms of this condition include:  Diarrhea.  Passing bloody or tarry stool.  Pain.  Fever.  Vomiting.  Tiredness (fatigue).  Weight loss.  Bloating.  Sudden increase in abdominal pain.  Having fewer bowel movements than usual. DIAGNOSIS This condition is diagnosed with a stool test or a blood test. You may also have other tests, including X-rays, a CT scan, or a colonoscopy. TREATMENT Treatment may include:  Resting the bowel. This involves not eating or drinking for a period of time.  Fluids that are given through an IV tube.  Medicine for pain and diarrhea.  Antibiotic medicines.  Cortisone medicines.  Surgery. HOME CARE INSTRUCTIONS Eating and Drinking  Follow instructions from your health care provider about eating or drinking restrictions.  Drink enough fluid to keep your urine clear or pale yellow.  Work with a dietitian to determine which foods cause your condition to flare up.  Avoid foods that cause flare-ups.  Eat a well-balanced diet. Medicines  Take over-the-counter and prescription medicines only as told by your health care provider.  If you were prescribed an antibiotic medicine, take it as told by your health care provider. Do not stop taking the antibiotic even if you start to feel better. General Instructions  Keep all follow-up visits as told by your health care provider. This is important. SEEK MEDICAL CARE IF:  Your symptoms do not go away.  You develop new symptoms. SEEK IMMEDIATE MEDICAL CARE IF:  You have a fever that does not go away with treatment.  You  develop chills.  You have extreme weakness, fainting, or dehydration.  You have repeated vomiting.  You develop severe pain in your abdomen.  You pass bloody or tarry stool.   This information is not intended to replace advice given to you by your health care provider. Make sure you discuss any questions you have with your health care provider.   Document Released: 12/06/2004 Document Revised: 07/20/2015 Document Reviewed: 02/21/2015 Elsevier Interactive Patient Education [employer].

## 2016-05-30 NOTE — ED Notes (Signed)
Bed: WHALG Expected date:  Expected time:  Means of arrival:  Comments: 

## 2016-05-30 NOTE — ED Notes (Addendum)
She feels better; and we have her ready for d/c; however, she discovers her home oxygen tank is out of O2.  It is marked as being from [place], but she states [employer] no longer supplies her O2--that this is "a two-year-old tank".  She further states that she has two small tanks at her home, "but there is no one that can go get them--the paramedics brought the wrong tank with me".

## 2016-05-30 NOTE — Progress Notes (Signed)
[facility] consulted for oxygen needs for discharge.  EDCM spoke to patient at bedside. Patient speaking with a "Malikai Gut" from [facility] on the phone as [facility] entered the room. Patient reports that [facility] can deliver oxygen to the ED, but patient reports her ride isn't going to wait that long. Patient reports there is no one else who can bring her oxygen from home.   Patient reports she has oxygen at home. EDCM suggested ambulance transfer home due to oxygen need.  Patient agreeable to this, "Or I can chance it",   Meaning ride with her friend without oxygen. [facility] informed patient that she may be charged for ambulance ride. Discussed with EDRN

## 2016-05-30 NOTE — ED Notes (Signed)
Bed: WA18 Expected date:  Expected time:  Means of arrival:  Comments: EMS-food poisoning 

## 2016-06-13 ENCOUNTER — Emergency Department (HOSPITAL_COMMUNITY): Payer: Medicaid Other

## 2016-06-13 ENCOUNTER — Emergency Department (HOSPITAL_COMMUNITY)
Admission: EM | Admit: 2016-06-13 | Discharge: 2016-06-13 | Disposition: A | Discharge status: Home / Self Care | Payer: Medicaid Other | Attending: Emergency Medicine | Admitting: Emergency Medicine

## 2016-06-13 ENCOUNTER — Encounter (HOSPITAL_COMMUNITY): Payer: Self-pay | Admitting: Emergency Medicine

## 2016-06-13 DIAGNOSIS — M1990 Unspecified osteoarthritis, unspecified site: Secondary | ICD-10-CM | POA: Insufficient documentation

## 2016-06-13 DIAGNOSIS — Z79899 Other long term (current) drug therapy: Secondary | ICD-10-CM | POA: Diagnosis not present

## 2016-06-13 DIAGNOSIS — E119 Type 2 diabetes mellitus without complications: Secondary | ICD-10-CM | POA: Diagnosis not present

## 2016-06-13 DIAGNOSIS — E785 Hyperlipidemia, unspecified: Secondary | ICD-10-CM | POA: Diagnosis not present

## 2016-06-13 DIAGNOSIS — I10 Essential (primary) hypertension: Secondary | ICD-10-CM | POA: Insufficient documentation

## 2016-06-13 DIAGNOSIS — Z87891 Personal history of nicotine dependence: Secondary | ICD-10-CM | POA: Insufficient documentation

## 2016-06-13 DIAGNOSIS — R112 Nausea with vomiting, unspecified: Secondary | ICD-10-CM | POA: Insufficient documentation

## 2016-06-13 DIAGNOSIS — R197 Diarrhea, unspecified: Secondary | ICD-10-CM | POA: Insufficient documentation

## 2016-06-13 DIAGNOSIS — J441 Chronic obstructive pulmonary disease with (acute) exacerbation: Secondary | ICD-10-CM | POA: Insufficient documentation

## 2016-06-13 DIAGNOSIS — R1110 Vomiting, unspecified: Secondary | ICD-10-CM

## 2016-06-13 DIAGNOSIS — R1084 Generalized abdominal pain: Secondary | ICD-10-CM | POA: Diagnosis present

## 2016-06-13 LAB — COMPREHENSIVE METABOLIC PANEL
ALBUMIN: 4.9 g/dL (ref 3.5–5.0)
ALK PHOS: 37 U/L — AB (ref 38–126)
ALT: 46 U/L (ref 14–54)
ANION GAP: 12 (ref 5–15)
AST: 57 U/L — ABNORMAL HIGH (ref 15–41)
BILIRUBIN TOTAL: 0.5 mg/dL (ref 0.3–1.2)
BUN: 21 mg/dL — ABNORMAL HIGH (ref 6–20)
CALCIUM: 9.5 mg/dL (ref 8.9–10.3)
CO2: 25 mmol/L (ref 22–32)
CREATININE: 1.25 mg/dL — AB (ref 0.44–1.00)
Chloride: 102 mmol/L (ref 101–111)
GFR calc Af Amer: 54 mL/min — ABNORMAL LOW (ref >60)
GFR calc non Af Amer: 47 mL/min — ABNORMAL LOW (ref >60)
GLUCOSE: 144 mg/dL — AB (ref 65–99)
Potassium: 3.7 mmol/L (ref 3.5–5.1)
Sodium: 139 mmol/L (ref 135–145)
TOTAL PROTEIN: 9.3 g/dL — AB (ref 6.5–8.1)

## 2016-06-13 LAB — LIPASE, BLOOD: Lipase: 17 U/L (ref 11–51)

## 2016-06-13 LAB — CBC
HEMATOCRIT: 39.8 % (ref 36.0–46.0)
Hemoglobin: 13.5 g/dL (ref 12.0–15.0)
MCH: 27.6 pg (ref 26.0–34.0)
MCHC: 33.9 g/dL (ref 30.0–36.0)
MCV: 81.4 fL (ref 78.0–100.0)
Platelets: 255 10*3/uL (ref 150–400)
RBC: 4.89 MIL/uL (ref 3.87–5.11)
RDW: 15.6 % — AB (ref 11.5–15.5)
WBC: 22.8 10*3/uL — AB (ref 4.0–10.5)

## 2016-06-13 LAB — CBG MONITORING, ED: Glucose-Capillary: 146 mg/dL — ABNORMAL HIGH (ref 65–99)

## 2016-06-13 MED ORDER — ONDANSETRON HCL 4 MG/2ML IJ SOLN
4.0000 mg | Freq: Once | INTRAMUSCULAR | Status: AC
  Administered 2016-06-13: 4 mg via INTRAVENOUS
  Filled 2016-06-13: qty 2

## 2016-06-13 MED ORDER — IOPAMIDOL (ISOVUE-300) INJECTION 61%
100.0000 mL | Freq: Once | INTRAVENOUS | Status: AC | PRN
  Administered 2016-06-13: 100 mL via INTRAVENOUS

## 2016-06-13 MED ORDER — SODIUM CHLORIDE 0.9 % IV SOLN
1000.0000 mL | Freq: Once | INTRAVENOUS | Status: AC
  Administered 2016-06-13: 1000 mL via INTRAVENOUS

## 2016-06-13 MED ORDER — MORPHINE SULFATE (PF) 4 MG/ML IV SOLN
4.0000 mg | INTRAVENOUS | Status: DC | PRN
Start: 2016-06-13 — End: 2016-06-14
  Administered 2016-06-13 (×2): 4 mg via INTRAVENOUS
  Filled 2016-06-13 (×2): qty 1

## 2016-06-13 MED ORDER — SODIUM CHLORIDE 0.9 % IV SOLN
1000.0000 mL | INTRAVENOUS | Status: DC
  Administered 2016-06-13 (×2): 1000 mL via INTRAVENOUS

## 2016-06-13 NOTE — ED Provider Notes (Signed)
Results for orders placed or performed during the hospital encounter of 06/13/16  CBC  Result Value Ref Range   WBC 22.8 (H) 4.0 - 10.5 K/uL   RBC 4.89 3.87 - 5.11 MIL/uL   Hemoglobin 13.5 12.0 - 15.0 g/dL   HCT 39.8 36.0 - 46.0 %   MCV 81.4 78.0 - 100.0 fL   MCH 27.6 26.0 - 34.0 pg   MCHC 33.9 30.0 - 36.0 g/dL   RDW 15.6 (H) 11.5 - 15.5 %   Platelets 255 150 - 400 K/uL  Comprehensive metabolic panel  Result Value Ref Range   Sodium 139 135 - 145 mmol/L   Potassium 3.7 3.5 - 5.1 mmol/L   Chloride 102 101 - 111 mmol/L   CO2 25 22 - 32 mmol/L   Glucose, Bld 144 (H) 65 - 99 mg/dL   BUN 21 (H) 6 - 20 mg/dL   Creatinine, Ser 1.25 (H) 0.44 - 1.00 mg/dL   Calcium 9.5 8.9 - 10.3 mg/dL   Total Protein 9.3 (H) 6.5 - 8.1 g/dL   Albumin 4.9 3.5 - 5.0 g/dL   AST 57 (H) 15 - 41 U/L   ALT 46 14 - 54 U/L   Alkaline Phosphatase 37 (L) 38 - 126 U/L   Total Bilirubin 0.5 0.3 - 1.2 mg/dL   GFR calc non Af Amer 47 (L) >60 mL/min   GFR calc Af Amer 54 (L) >60 mL/min   Anion gap 12 5 - 15  Lipase, blood  Result Value Ref Range   Lipase 17 11 - 51 U/L  CBG monitoring, ED  Result Value Ref Range   Glucose-Capillary 146 (H) 65 - 99 mg/dL   Ct Abdomen Pelvis W Contrast  Result Date: 06/13/2016 CLINICAL DATA:  Abdominal pain. Nausea and vomiting. Symptoms began at 6 a.m. to day. EXAM: CT ABDOMEN AND PELVIS WITH CONTRAST TECHNIQUE: Multidetector CT imaging of the abdomen and pelvis was performed using the standard protocol following bolus administration of intravenous contrast. CONTRAST:  142mL ISOVUE-300 IOPAMIDOL (ISOVUE-300) INJECTION 61% COMPARISON:  Acute abdominal series from the same day. CT abdomen and pelvis 11/20/2013. FINDINGS: Lower chest: There is nonunion across the previously-seen left eighth rib fracture. Adjacent linear scarring is noted. There is dependent atelectasis at the right base. The heart size is normal. No significant pleural or pericardial effusion is present. Hepatobiliary:  Diffuse fatty infiltration of liver is again noted. Focal calcification near the dome is unchanged. No discrete lesions are present. The common bile duct is within normal limits. The gallbladder is not visualized. Pancreas: The pancreas is within normal limits. No significant inflammatory changes are present. There is no solid or cystic mass. No ductal dilation is present. Spleen: Within normal limits Adrenals/Urinary Tract: The adrenal glands are normal bilaterally. The kidneys and ureters are unremarkable. The urinary bladder is normal. Stomach/Bowel: The stomach and duodenum are within normal limits. Small bowel is unremarkable. There is no obstruction or free air. The appendix is visualized and normal. The ascending and transverse colon is within normal limits. The descending and rectosigmoid colon are unremarkable. Vascular/Lymphatic: Scattered atherosclerotic calcifications are present without aneurysm. No significant abdominal or retroperitoneal adenopathy is present. Reproductive: The uterus is somewhat heterogeneous, suggesting fibroids. No discrete lesions are present. The adnexa are within normal limits for age. Other: No significant free fluid or free air is present. A 15 mm nodule in the right breast demonstrates slight interval increase in size since 2015 on the sagittal reformats. No other focal soft tissue  lesions are present. Musculoskeletal: Multilevel degenerative changes are noted within the lumbar spine. Focal leftward curvature is present at L4-5. Schmorl's nodes are present on the right at L4-5 with progression. There is overall progression of right-sided degenerative change. IMPRESSION: 1. No acute or focal lesion to explain the patient's symptoms. 2. Stable diffuse hepatic steatosis. 3. Atherosclerosis. 4. Slight progression of multilevel degenerative change. 5. Interval increase in size at 15 mm right breast nodule. Recommend diagnostic mammogram for further workup. 6. Remote left eighth  rib fracture with incomplete healing. 7. Scarring or atelectasis in both lower lobes. Electronically Signed   By: San Morelle M.D.   On: 06/13/2016 18:25   Dg Abd 2 Views  Result Date: 06/13/2016 CLINICAL DATA:  Go  that is nausea, vomiting, diarrhea. EXAM: ABDOMEN - 2 VIEW COMPARISON:  CT 11/20/2013 FINDINGS: There is normal bowel gas pattern. No free air. No organomegaly or suspicious calcification. No acute bony abnormality. IMPRESSION: Negative. Electronically Signed   By: Rolm Baptise M.D.   On: 06/13/2016 13:44      Patient was seen by Dr. Venora Maples. Care was taken over by me. She presents with vomiting and diarrhea associated epigastric pain. She had similar symptoms about 2 weeks ago. Her emesis is nonbloody. Her stool was nonbloody. She is status post cholecystectomy. She does have an elevated WBC count which is up from her last WBC count of 15,002 weeks ago. She no longer has abdominal pain other than some very mild discomfort in her epigastrium. CT scan doesn't show any acute abnormalities. There is an enlarged nodule in her breast which I discussed with the patient. She actually is in the process of scheduling her annual mammogram. Patient has tolerated by mouth fluids. She does describe to me left-sided chest pain. She's been having this for several weeks. She's very seeing cardiology. She has an upcoming appointment with Dr. Radford Pax. It doesn't sound any different then the pain she's had in the past. I did do an EKG which looks unchanged. At this point I don't feel that she needs troponin since she's not having any new or different symptoms. The pain is reproducible on palpation of the left chest wall. She was discharged home in good condition. I did encourage her to follow-up with her PCP. She'll need recheck on her labs and reassessment. She may need gastric neurology follow-up if her symptoms continue. She is on protonix, I encouraged her to continue this. Her vitals are WNL.  She was  advised return here if she has any worsening symptoms.   Malvin Johns, MD 06/13/16 2023

## 2016-06-13 NOTE — ED Notes (Signed)
Bed: WA08 Expected date:  Expected time:  Means of arrival:  Comments: EMS abdominal pain 

## 2016-06-13 NOTE — ED Provider Notes (Signed)
[facility] DEPT Provider Note   CSN: [id number] Arrival date & time: 06/13/16  1116  First Provider Contact:  First MD Initiated Contact with Patient 06/13/16 1135        History   Chief Complaint Chief Complaint  Patient presents with  . Abdominal Pain  . Nausea  . Emesis    HPI Stephanie Schultz is a 60 y.o. female.  Patient presents the emergency department with complaints of nausea vomiting diarrhea which began this morning.  She denies hematemesis.  No fevers or chills.  Reports no blood in her stool.  Reports generalized abdominal cramping.  No focal abdominal pain.  Denies back pain or flank pain.  No urinary complaints.  Denies chest pain shortness of breath.  Pain is moderate in severity   The history is provided by the patient and medical records.  Emesis      Past Medical History:  Diagnosis Date  . Anemia    chronis  . Anxiety   . Arthritis   . Cancer ([place]) 1990   cervical   . COPD (chronic obstructive pulmonary disease) (HCC)    Pulmo: [name]  . Diabetes mellitus without complication ([place])    a. A1c 8.3 in 11/2015  . Emphysema   . Gallstones    s/p cholecystectomy  . GERD (gastroesophageal reflux disease)   . Gout   . Hx of cardiac catheterization    a. LHC at [facility] in [place], [place] 09/2008:  Normal coronary arteries EF 70%.  . Hyperlipidemia   . Hypertension   . Morbid obesity ([place])   . OSA on CPAP    CPAP at night   . Pneumonia   . Sickle cell trait ([place])   . Supplemental oxygen dependent    2L CONTINUOUSLEY  . Tobacco abuse    a. up to 3ppd from age 77 to 38, now 1/4 ppd (01/2013) >> Quit 10/2015    Patient Active Problem List   Diagnosis Date Noted  . COPD exacerbation ([place]) 01/23/2016  . S/P laparoscopic cholecystectomy 12/13/2015  . Chronic respiratory failure ([place]) 03/31/2015  . Rib fracture 03/31/2015  . Arthritis 12/13/2014  . Gout 03/03/2014  . Diabetic neuropathy ([place]) 03/03/2014  . Carpal tunnel syndrome 03/03/2014  . DM  type 2 (diabetes mellitus, type 2) ([place]) 05/22/2013  . Anxiety 05/22/2013  . Transaminasemia 05/09/2013  . Leukocytosis 04/27/2013  . Tobacco abuse   . Chest pain   . Sickle cell trait ([place])   . Morbid obesity ([place])   . Diastolic dysfunction [id number]  . Hoarseness 12/22/2012  . Epistaxis, recurrent 07/08/2012  . GERD (gastroesophageal reflux disease) 09/20/2011  . Pulmonary nodule 09/20/2011  . COPD (chronic obstructive pulmonary disease) ([place]) 04/04/2011  . Diabetes mellitus type 2, controlled ([place]) 04/04/2011  . Hypertension 04/04/2011  . Obstructive sleep apnea 04/04/2011  . Chronic allergic rhinitis 04/04/2011    Past Surgical History:  Procedure Laterality Date  . CARDIAC CATHETERIZATION    . CHOLECYSTECTOMY N/A 12/13/2015   Procedure: LAPAROSCOPIC CHOLECYSTECTOMY;  Surgeon: Ralene Ok, MD;  Location: [facility] ORS;  Service: General;  Laterality: N/A;  . COLONOSCOPY  09/05/2012   Procedure: COLONOSCOPY;  Surgeon: Beryle Beams, MD;  Location: [facility] ENDOSCOPY;  Service: Endoscopy;  Laterality: N/A;  . COLONOSCOPY WITH PROPOFOL N/A 09/02/2015   Procedure: COLONOSCOPY WITH PROPOFOL;  Surgeon: Carol Ada, MD;  Location: [facility] ENDOSCOPY;  Service: Endoscopy;  Laterality: N/A;  . TUBAL LIGATION      OB History    No data  available       Home Medications    Prior to Admission medications   Medication Sig Start Date End Date Taking? Authorizing Provider  albuterol (PROVENTIL) (2.5 MG/3ML) 0.083% nebulizer solution Take 2.5 mg by nebulization 4 (four) times daily.     Historical Provider, MD  allopurinol (ZYLOPRIM) 100 MG tablet Take 100 mg by mouth daily.    Historical Provider, MD  ALPRAZolam Duanne Moron) 1 MG tablet Take 1 mg by mouth 2 (two) times daily. 04/13/16   Historical Provider, MD  amLODipine (NORVASC) 2.5 MG tablet Take 1 tablet (2.5 mg total) by mouth daily. 04/26/16   Liliane Shi, PA-C  budesonide-formoterol (SYMBICORT) 160-4.5 MCG/ACT inhaler Inhale 2 puffs into the  lungs 2 (two) times daily. 05/03/15   Collene Gobble, MD  fluticasone (FLONASE) 50 MCG/ACT nasal spray Place 1 spray into both nostrils daily. Patient not taking: Reported on 05/30/2016 03/24/16   Belkys A Regalado, MD  fluticasone (FLONASE) 50 MCG/ACT nasal spray INHALE 2 SPRAYS IN EACH NOSTRIL ONCE DAILY Patient taking differently: INHALE 1 SPRAYS IN EACH NOSTRIL ONCE DAILY 05/18/16   Collene Gobble, MD  furosemide (LASIX) 20 MG tablet Take 20 mg by mouth daily.    Historical Provider, MD  glipiZIDE (GLUCOTROL) 10 MG tablet Take 10 mg by mouth 2 (two) times daily before a meal.     Historical Provider, MD  guaiFENesin (MUCINEX) 600 MG 12 hr tablet Take 1 tablet (600 mg total) by mouth 2 (two) times daily. 03/24/16   Belkys A Regalado, MD  HYDROcodone-acetaminophen (NORCO) 7.5-325 MG tablet Take 1 tablet by mouth every 6 (six) hours as needed for moderate pain.    Historical Provider, MD  JANUVIA 100 MG tablet Take 100 mg by mouth daily. 05/24/16   Historical Provider, MD  Linaclotide (LINZESS) 145 MCG CAPS capsule Take 145 mcg by mouth daily as needed (CONSTIPATION).     Historical Provider, MD  loratadine (CLARITIN) 10 MG tablet Take 10 mg by mouth daily.    Historical Provider, MD  nystatin cream (MYCOSTATIN) Apply 1 application topically 2 (two) times daily. Yeast infection    Historical Provider, MD  Olopatadine HCl (PAZEO) 0.7 % SOLN Place 1 drop into both eyes daily.     Historical Provider, MD  ondansetron (ZOFRAN) 4 MG tablet Take 1 tablet (4 mg total) by mouth every 6 (six) hours as needed for nausea or vomiting. 05/30/16   Bethany Molt, DO  OXYGEN Inhale 2.5 L into the lungs continuous.    Historical Provider, MD  pantoprazole (PROTONIX) 40 MG tablet Take 2 tablets (80 mg total) by mouth daily at 12 noon. Patient taking differently: Take 40 mg by mouth daily at 12 noon.  03/24/16   Belkys A Regalado, MD  pioglitazone (ACTOS) 30 MG tablet Take 30 mg by mouth daily. 12/29/15   Historical Provider,  MD  pravastatin (PRAVACHOL) 20 MG tablet Take 20 mg by mouth daily.    Historical Provider, MD  pregabalin (LYRICA) 75 MG capsule Take 75 mg by mouth 2 (two) times daily.    Historical Provider, MD  PROAIR HFA 108 (90 Base) MCG/ACT inhaler INHALE 2 PUFFS BY MOUTH EVERY 4 HOURS AS NEEDED FOR WHEEZING Patient taking differently: INHALE 2 PUFFS BY MOUTH EVERY 4 HOURS AS NEEDED FOR  WHEEZING 12/19/15   Collene Gobble, MD  sodium chloride (OCEAN) 0.65 % SOLN nasal spray Place 1 spray into both nostrils as needed for congestion.    Historical Provider, MD  tiotropium (SPIRIVA) 18 MCG inhalation capsule Place 1 capsule (18 mcg total) into inhaler and inhale daily. 05/03/15   Collene Gobble, MD  triamterene-hydrochlorothiazide (DYAZIDE) 37.5-25 MG per capsule Take 1 capsule by mouth daily.    Historical Provider, MD  zolpidem (AMBIEN) 10 MG tablet Take 10 mg by mouth at bedtime.    Historical Provider, MD    Family History Family History  Problem Relation Age of Onset  . Other Father     unaware of father's medical history  . Diabetes Mother     alive @ 94  . Myasthenia gravis Mother   . Lung cancer Paternal Aunt   . Lung cancer Paternal Grandfather   . Other      multiple siblings a&w.  . Heart attack Neg Hx   . Heart failure Neg Hx     Social History Social History  Substance Use Topics  . Smoking status: Former Smoker    Packs/day: 0.50    Years: 36.00    Types: Cigarettes    Quit date: 11/11/2015  . Smokeless tobacco: Never Used     Comment: Approx 90 pk-yrs (up to 3ppd until ~ 2009). Smoking 3 cigs per day now.  . Alcohol use No     Allergies   Acetaminophen   Review of Systems Review of Systems  Gastrointestinal: Positive for vomiting.  All other systems reviewed and are negative.    Physical Exam Updated Vital Signs BP 138/69 (BP Location: Left Arm)   Pulse 115   Temp 98.4 F (36.9 C) (Oral)   Resp 26   Ht 5' (1.524 m)   Wt 216 lb (98 kg)   LMP 06/04/2000 (LMP  Unknown)   SpO2 93%   BMI 42.18 kg/m   Physical Exam  Constitutional: She is oriented to person, place, and time. She appears well-developed and well-nourished. No distress.  HENT:  Head: Normocephalic and atraumatic.  Eyes: EOM are normal.  Neck: Normal range of motion.  Cardiovascular: Normal rate, regular rhythm and normal heart sounds.   Pulmonary/Chest: Effort normal and breath sounds normal.  Abdominal: Soft. She exhibits no distension. There is tenderness.  Musculoskeletal: Normal range of motion.  Neurological: She is alert and oriented to person, place, and time.  Skin: Skin is warm and dry.  Psychiatric: She has a normal mood and affect. Judgment normal.  Nursing note and vitals reviewed.    ED Treatments / Results  Labs (all labs ordered are listed, but only abnormal results are displayed) Labs Reviewed  CBC - Abnormal; Notable for the following:       Result Value   WBC 22.8 (*)    RDW 15.6 (*)    All other components within normal limits  COMPREHENSIVE METABOLIC PANEL - Abnormal; Notable for the following:    Glucose, Bld 144 (*)    BUN 21 (*)    Creatinine, Ser 1.25 (*)    Total Protein 9.3 (*)    AST 57 (*)    Alkaline Phosphatase 37 (*)    GFR calc non Af Amer 47 (*)    GFR calc Af Amer 54 (*)    All other components within normal limits  CBG MONITORING, ED - Abnormal; Notable for the following:    Glucose-Capillary 146 (*)    All other components within normal limits  LIPASE, BLOOD    EKG  EKG Interpretation None       Radiology No results found.  Procedures Procedures (including critical care time)  Medications Ordered in ED Medications  0.9 %  sodium chloride infusion (1,000 mLs Intravenous New Bag/Given 06/13/16 1205)    Followed by  0.9 %  sodium chloride infusion (1,000 mLs Intravenous New Bag/Given 06/13/16 1153)  morphine 4 MG/ML injection 4 mg (4 mg Intravenous Given 06/13/16 1444)  ondansetron (ZOFRAN) injection 4 mg (4 mg  Intravenous Given 06/13/16 1152)     Initial Impression / Assessment and Plan / ED Course  I have reviewed the triage vital signs and the nursing notes.  Pertinent labs & imaging results that were available during my care of the patient were reviewed by me and considered in my medical decision making (see chart for details).  Clinical Course  Patient nausea vomiting diarrhea.  Elevated white blood cell count.  Will perform CT of the patient still having discomfort and pain at this time.  She is overall well-appearing.  She is nontoxic.  Dr. Tamera Punt to follow up on CT imaging  Final Clinical Impressions(s) / ED Diagnoses   Final diagnoses:  Vomiting    New Prescriptions New Prescriptions   No medications on file     Jola Schmidt, MD 06/13/16 1757

## 2016-06-13 NOTE — ED Notes (Signed)
Pt wheeled to lobby.  Ambulatory and independent at discharge.  Verbalized understanding of discharge instructions.

## 2016-06-13 NOTE — ED Triage Notes (Signed)
Patient here from home with complaints of abd pain, nausea, vomiting that started around 6 this morning. Actively vomiting in room. Reports being seen for same thing a couple of weeks ago. Pain 8/10.

## 2016-06-14 ENCOUNTER — Other Ambulatory Visit: Payer: Self-pay | Admitting: Internal Medicine

## 2016-06-14 ENCOUNTER — Other Ambulatory Visit: Payer: Self-pay | Admitting: Emergency Medicine

## 2016-06-14 DIAGNOSIS — N6310 Unspecified lump in the right breast, unspecified quadrant: Secondary | ICD-10-CM

## 2016-06-19 ENCOUNTER — Other Ambulatory Visit: Payer: Medicaid Other

## 2016-06-20 ENCOUNTER — Encounter: Payer: Self-pay | Admitting: Cardiology

## 2016-06-29 ENCOUNTER — Ambulatory Visit: Payer: Medicaid Other | Admitting: Podiatry

## 2016-07-04 ENCOUNTER — Ambulatory Visit: Payer: Medicaid Other | Admitting: Cardiology

## 2016-07-06 ENCOUNTER — Ambulatory Visit: Payer: Medicaid Other | Admitting: Cardiology

## 2016-07-19 ENCOUNTER — Other Ambulatory Visit: Payer: Medicaid Other

## 2016-07-19 ENCOUNTER — Inpatient Hospital Stay (HOSPITAL_COMMUNITY)
Admission: EM | Admit: 2016-07-19 | Discharge: 2016-07-22 | DRG: 190 | Disposition: A | Discharge status: Home / Self Care | Payer: Medicaid Other | Attending: Internal Medicine | Admitting: Internal Medicine

## 2016-07-19 ENCOUNTER — Emergency Department (HOSPITAL_COMMUNITY): Payer: Medicaid Other

## 2016-07-19 ENCOUNTER — Encounter (HOSPITAL_COMMUNITY): Payer: Self-pay

## 2016-07-19 ENCOUNTER — Other Ambulatory Visit: Payer: Self-pay | Admitting: Internal Medicine

## 2016-07-19 ENCOUNTER — Ambulatory Visit
Admission: RE | Admit: 2016-07-19 | Discharge: 2016-07-19 | Disposition: A | Discharge status: Home / Self Care | Payer: Medicaid Other | Source: Ambulatory Visit | Attending: Internal Medicine | Admitting: Internal Medicine

## 2016-07-19 DIAGNOSIS — J440 Chronic obstructive pulmonary disease with acute lower respiratory infection: Secondary | ICD-10-CM | POA: Diagnosis present

## 2016-07-19 DIAGNOSIS — G4733 Obstructive sleep apnea (adult) (pediatric): Secondary | ICD-10-CM | POA: Diagnosis present

## 2016-07-19 DIAGNOSIS — Z801 Family history of malignant neoplasm of trachea, bronchus and lung: Secondary | ICD-10-CM

## 2016-07-19 DIAGNOSIS — E785 Hyperlipidemia, unspecified: Secondary | ICD-10-CM | POA: Diagnosis present

## 2016-07-19 DIAGNOSIS — Z87891 Personal history of nicotine dependence: Secondary | ICD-10-CM

## 2016-07-19 DIAGNOSIS — Z9114 Patient's other noncompliance with medication regimen: Secondary | ICD-10-CM

## 2016-07-19 DIAGNOSIS — Z9981 Dependence on supplemental oxygen: Secondary | ICD-10-CM

## 2016-07-19 DIAGNOSIS — N6310 Unspecified lump in the right breast, unspecified quadrant: Secondary | ICD-10-CM

## 2016-07-19 DIAGNOSIS — Z833 Family history of diabetes mellitus: Secondary | ICD-10-CM

## 2016-07-19 DIAGNOSIS — Z79899 Other long term (current) drug therapy: Secondary | ICD-10-CM

## 2016-07-19 DIAGNOSIS — J9601 Acute respiratory failure with hypoxia: Secondary | ICD-10-CM | POA: Diagnosis present

## 2016-07-19 DIAGNOSIS — Z9049 Acquired absence of other specified parts of digestive tract: Secondary | ICD-10-CM

## 2016-07-19 DIAGNOSIS — I5189 Other ill-defined heart diseases: Secondary | ICD-10-CM | POA: Diagnosis present

## 2016-07-19 DIAGNOSIS — J209 Acute bronchitis, unspecified: Secondary | ICD-10-CM

## 2016-07-19 DIAGNOSIS — I110 Hypertensive heart disease with heart failure: Secondary | ICD-10-CM | POA: Diagnosis present

## 2016-07-19 DIAGNOSIS — K219 Gastro-esophageal reflux disease without esophagitis: Secondary | ICD-10-CM | POA: Diagnosis present

## 2016-07-19 DIAGNOSIS — J441 Chronic obstructive pulmonary disease with (acute) exacerbation: Principal | ICD-10-CM

## 2016-07-19 DIAGNOSIS — D573 Sickle-cell trait: Secondary | ICD-10-CM | POA: Diagnosis present

## 2016-07-19 DIAGNOSIS — I5032 Chronic diastolic (congestive) heart failure: Secondary | ICD-10-CM | POA: Diagnosis present

## 2016-07-19 DIAGNOSIS — J9600 Acute respiratory failure, unspecified whether with hypoxia or hypercapnia: Secondary | ICD-10-CM | POA: Diagnosis present

## 2016-07-19 DIAGNOSIS — E119 Type 2 diabetes mellitus without complications: Secondary | ICD-10-CM

## 2016-07-19 LAB — I-STAT TROPONIN, ED
Troponin i, poc: 0 ng/mL (ref 0.00–0.08)
Troponin i, poc: 0 ng/mL (ref 0.00–0.08)

## 2016-07-19 LAB — BASIC METABOLIC PANEL WITH GFR
Anion gap: 7 (ref 5–15)
BUN: 14 mg/dL (ref 6–20)
CO2: 28 mmol/L (ref 22–32)
Calcium: 9.2 mg/dL (ref 8.9–10.3)
Chloride: 106 mmol/L (ref 101–111)
Creatinine, Ser: 0.96 mg/dL (ref 0.44–1.00)
GFR calc Af Amer: >60 mL/min
GFR calc non Af Amer: >60 mL/min
Glucose, Bld: 79 mg/dL (ref 65–99)
Potassium: 3.9 mmol/L (ref 3.5–5.1)
Sodium: 141 mmol/L (ref 135–145)

## 2016-07-19 LAB — CBC WITH DIFFERENTIAL/PLATELET
Basophils Absolute: 0 K/uL (ref 0.0–0.1)
Basophils Relative: 0 %
Eosinophils Absolute: 0 K/uL (ref 0.0–0.7)
Eosinophils Relative: 0 %
HCT: 34.4 % — ABNORMAL LOW (ref 36.0–46.0)
Hemoglobin: 12 g/dL (ref 12.0–15.0)
Lymphocytes Relative: 32 %
Lymphs Abs: 2.2 K/uL (ref 0.7–4.0)
MCH: 27.5 pg (ref 26.0–34.0)
MCHC: 34.9 g/dL (ref 30.0–36.0)
MCV: 78.9 fL (ref 78.0–100.0)
Monocytes Absolute: 0.4 K/uL (ref 0.1–1.0)
Monocytes Relative: 6 %
Neutro Abs: 4.2 K/uL (ref 1.7–7.7)
Neutrophils Relative %: 62 %
Platelets: 243 K/uL (ref 150–400)
RBC: 4.36 MIL/uL (ref 3.87–5.11)
RDW: 15.4 % (ref 11.5–15.5)
WBC: 6.8 K/uL (ref 4.0–10.5)

## 2016-07-19 LAB — CBG MONITORING, ED: Glucose-Capillary: 84 mg/dL (ref 65–99)

## 2016-07-19 LAB — BRAIN NATRIURETIC PEPTIDE: B Natriuretic Peptide: 21.5 pg/mL (ref 0.0–100.0)

## 2016-07-19 LAB — D-DIMER, QUANTITATIVE: D-Dimer, Quant: 0.47 ug{FEU}/mL (ref 0.00–0.50)

## 2016-07-19 MED ORDER — IPRATROPIUM-ALBUTEROL 0.5-2.5 (3) MG/3ML IN SOLN
3.0000 mL | Freq: Once | RESPIRATORY_TRACT | Status: AC
  Administered 2016-07-19: 3 mL via RESPIRATORY_TRACT
  Filled 2016-07-19: qty 3

## 2016-07-19 MED ORDER — METHYLPREDNISOLONE SODIUM SUCC 125 MG IJ SOLR
125.0000 mg | Freq: Once | INTRAMUSCULAR | Status: AC
  Administered 2016-07-19: 125 mg via INTRAVENOUS
  Filled 2016-07-19: qty 2

## 2016-07-19 NOTE — ED Provider Notes (Signed)
[facility] DEPT Provider Note   CSN: [id number] Arrival date & time: 07/19/16  1557     History   Chief Complaint Chief Complaint  Patient presents with  . Shortness of Breath    HPI Stephanie Schultz is a 60 y.o. female with a pmhx of COPD, OSA, CHF, DM, HLD, HTN, morbid obesity who presents to the ED today c/o SOB. Pt states that yesterday morning she began experiencing worsening DOE. Pt states that today she was getting a mammogram performed and was unable to walk to the door due to SOB. Pt is on home O2, 2.5 L but has been requiring more O2. Pt states that she was prescribed a fluid pill last week and was supposed to take 2 BID but it caused her to have stomach pains so she has not taken it on over a week. Pt also states that she was experiencing "soreness" in the center of her chest yesterday that is worse with palpation. Pt is currently CP free. Pt also states that her ankles have been swelling. Pt is concerned that she may have "fluid on her lungs" so she came to the ED for further evaluation. Pt states that she has lost 12 lbs but still has high sodium diet.   HPI  Past Medical History:  Diagnosis Date  . Anemia    chronis  . Anxiety   . Arthritis   . Cancer ([place]) 1990   cervical   . COPD (chronic obstructive pulmonary disease) (HCC)    Pulmo: [name]  . Diabetes mellitus without complication ([place])    a. A1c 8.3 in 11/2015  . Emphysema   . Gallstones    s/p cholecystectomy  . GERD (gastroesophageal reflux disease)   . Gout   . Hx of cardiac catheterization    a. LHC at [facility] in [place], [place] 09/2008:  Normal coronary arteries EF 70%.  . Hyperlipidemia   . Hypertension   . Morbid obesity ([place])   . OSA on CPAP    CPAP at night   . Pneumonia   . Sickle cell trait ([place])   . Supplemental oxygen dependent    2L CONTINUOUSLEY  . Tobacco abuse    a. up to 3ppd from age 45 to 8, now 1/4 ppd (01/2013) >> Quit 10/2015    Patient Active Problem List   Diagnosis Date  Noted  . COPD exacerbation ([place]) 01/23/2016  . S/P laparoscopic cholecystectomy 12/13/2015  . Chronic respiratory failure ([place]) 03/31/2015  . Rib fracture 03/31/2015  . Arthritis 12/13/2014  . Gout 03/03/2014  . Diabetic neuropathy ([place]) 03/03/2014  . Carpal tunnel syndrome 03/03/2014  . DM type 2 (diabetes mellitus, type 2) ([place]) 05/22/2013  . Anxiety 05/22/2013  . Transaminasemia 05/09/2013  . Leukocytosis 04/27/2013  . Tobacco abuse   . Chest pain   . Sickle cell trait ([place])   . Morbid obesity ([place])   . Diastolic dysfunction [id number]  . Hoarseness 12/22/2012  . Epistaxis, recurrent 07/08/2012  . GERD (gastroesophageal reflux disease) 09/20/2011  . Pulmonary nodule 09/20/2011  . COPD (chronic obstructive pulmonary disease) ([place]) 04/04/2011  . Diabetes mellitus type 2, controlled ([place]) 04/04/2011  . Hypertension 04/04/2011  . Obstructive sleep apnea 04/04/2011  . Chronic allergic rhinitis 04/04/2011    Past Surgical History:  Procedure Laterality Date  . CARDIAC CATHETERIZATION    . CHOLECYSTECTOMY N/A 12/13/2015   Procedure: LAPAROSCOPIC CHOLECYSTECTOMY;  Surgeon: Ralene Ok, MD;  Location: [facility];  Service: General;  Laterality: N/A;  . COLONOSCOPY  09/05/2012   Procedure: COLONOSCOPY;  Surgeon: Beryle Beams, MD;  Location: [facility];  Service: Endoscopy;  Laterality: N/A;  . COLONOSCOPY WITH PROPOFOL N/A 09/02/2015   Procedure: COLONOSCOPY WITH PROPOFOL;  Surgeon: Carol Ada, MD;  Location: [facility] END[facility];  Service: Endoscopy;  Laterality: N/A;  . TUBAL LIGATION      OB History    No data available       Home Medications    Prior to Admission medications   Medication Sig Start Date End Date Taking? Authorizing Provider  albuterol (PROVENTIL) (2.5 MG/3ML) 0.083% nebulizer solution Take 2.5 mg by nebulization 4 (four) times daily.    Yes Historical Provider, MD  allopurinol (ZYLOPRIM) 100 MG tablet Take 100 mg by mouth daily.   Yes Historical Provider,  MD  ALPRAZolam Duanne Moron) 1 MG tablet Take 1 mg by mouth 2 (two) times daily as needed for anxiety.  04/13/16  Yes Historical Provider, MD  AMITIZA 24 MCG capsule Take 1 capsule by mouth daily. 07/13/16  Yes Historical Provider, MD  amLODipine (NORVASC) 2.5 MG tablet Take 1 tablet (2.5 mg total) by mouth daily. 04/26/16  Yes Scott Joylene Draft, PA-C  budesonide-formoterol (SYMBICORT) 160-4.5 MCG/ACT inhaler Inhale 2 puffs into the lungs 2 (two) times daily. 05/03/15  Yes Collene Gobble, MD  fluticasone (FLONASE) 50 MCG/ACT nasal spray INHALE 2 SPRAYS IN EACH NOSTRIL ONCE DAILY Patient taking differently: INHALE 1 SPRAYS IN EACH NOSTRIL ONCE DAILY 05/18/16  Yes Collene Gobble, MD  furosemide (LASIX) 20 MG tablet Take 20 mg by mouth daily.   Yes Historical Provider, MD  glipiZIDE (GLUCOTROL) 10 MG tablet Take 10 mg by mouth 2 (two) times daily before a meal.    Yes Historical Provider, MD  guaiFENesin (MUCINEX) 600 MG 12 hr tablet Take 1 tablet (600 mg total) by mouth 2 (two) times daily. 03/24/16  Yes Belkys A Regalado, MD  HYDROcodone-acetaminophen (NORCO) 7.5-325 MG tablet Take 1 tablet by mouth every 6 (six) hours as needed for moderate pain.   Yes Historical Provider, MD  JANUVIA 100 MG tablet Take 100 mg by mouth daily. 05/24/16  Yes Historical Provider, MD  Linaclotide Rolan Lipa) 145 MCG CAPS capsule Take 145 mcg by mouth daily.    Yes Historical Provider, MD  loratadine (CLARITIN) 10 MG tablet Take 10 mg by mouth daily.   Yes Historical Provider, MD  nystatin cream (MYCOSTATIN) Apply 1 application topically 2 (two) times daily as needed (yeast infection.).    Yes Historical Provider, MD  Olopatadine HCl (PAZEO) 0.7 % SOLN Place 1 drop into both eyes daily as needed (allergies).    Yes Historical Provider, MD  ondansetron (ZOFRAN) 4 MG tablet Take 1 tablet (4 mg total) by mouth every 6 (six) hours as needed for nausea or vomiting. 05/30/16  Yes Bethany Molt, DO  OXYGEN Inhale 2.5 L into the lungs continuous.    Yes Historical Provider, MD  pantoprazole (PROTONIX) 40 MG tablet Take 2 tablets (80 mg total) by mouth daily at 12 noon. Patient taking differently: Take 40 mg by mouth daily at 12 noon.  03/24/16  Yes Belkys A Regalado, MD  pioglitazone (ACTOS) 30 MG tablet Take 30 mg by mouth daily. 12/29/15  Yes Historical Provider, MD  pravastatin (PRAVACHOL) 20 MG tablet Take 20 mg by mouth daily.   Yes Historical Provider, MD  pregabalin (LYRICA) 75 MG capsule Take 75 mg by mouth 2 (two) times daily.   Yes Historical Provider, MD  PROAIR HFA 108 [phone]  Base) MCG/ACT inhaler INHALE 2 PUFFS BY MOUTH EVERY 4 HOURS AS NEEDED FOR WHEEZING 06/14/16  Yes Collene Gobble, MD  sodium chloride (OCEAN) 0.65 % SOLN nasal spray Place 1 spray into both nostrils as needed for congestion.   Yes Historical Provider, MD  tiotropium (SPIRIVA) 18 MCG inhalation capsule Place 1 capsule (18 mcg total) into inhaler and inhale daily. 05/03/15  Yes Collene Gobble, MD  triamterene-hydrochlorothiazide (DYAZIDE) 37.5-25 MG per capsule Take 1 capsule by mouth daily.   Yes Historical Provider, MD  zolpidem (AMBIEN) 10 MG tablet Take 10 mg by mouth at bedtime.   Yes Historical Provider, MD  fluticasone (FLONASE) 50 MCG/ACT nasal spray Place 1 spray into both nostrils daily. Patient not taking: Reported on 05/30/2016 03/24/16   Elmarie Shiley, MD    Family History Family History  Problem Relation Age of Onset  . Other Father     unaware of father's medical history  . Diabetes Mother     alive @ 60  . Myasthenia gravis Mother   . Lung cancer Paternal Aunt   . Lung cancer Paternal Grandfather   . Other      multiple siblings a&w.  . Heart attack Neg Hx   . Heart failure Neg Hx     Social History Social History  Substance Use Topics  . Smoking status: Former Smoker    Packs/day: 0.50    Years: 36.00    Types: Cigarettes    Quit date: 11/11/2015  . Smokeless tobacco: Never Used     Comment: Approx 90 pk-yrs (up to 3ppd until ~  2009). Smoking 3 cigs per day now.  . Alcohol use No     Allergies   Acetaminophen   Review of Systems Review of Systems  All other systems reviewed and are negative.    Physical Exam Updated Vital Signs BP 137/69 (BP Location: Left Wrist)   Pulse 99   Temp 97.7 F (36.5 C) (Oral)   Resp 20   Ht 5' (1.524 m)   Wt 104.8 kg   LMP 06/04/2000 (LMP Unknown)   SpO2 95%   BMI 45.11 kg/m   Physical Exam  Constitutional: She is oriented to person, place, and time. No distress.  Obese  HENT:  Head: Normocephalic and atraumatic.  Mouth/Throat: No oropharyngeal exudate.  Eyes: Conjunctivae and EOM are normal. Pupils are equal, round, and reactive to light. Right eye exhibits no discharge. Left eye exhibits no discharge. No scleral icterus.  Cardiovascular: Normal rate, regular rhythm, normal heart sounds and intact distal pulses.  Exam reveals no gallop and no friction rub.   No murmur heard. Pulmonary/Chest: No respiratory distress. She has wheezes ( b/l expiratory wheezes in upper lung fields). She has no rales. She exhibits tenderness ( anterior chest wall TTP).  Shallow breath sounds  Abdominal: Soft. She exhibits no distension. There is no tenderness. There is no guarding.  Musculoskeletal: Normal range of motion. She exhibits no edema.  Neurological: She is alert and oriented to person, place, and time.  Skin: Skin is warm and dry. No rash noted. She is not diaphoretic. No erythema. No pallor.  Psychiatric: She has a normal mood and affect. Her behavior is normal.  Nursing note and vitals reviewed.    ED Treatments / Results  Labs (all labs ordered are listed, but only abnormal results are displayed) Labs Reviewed  CBC WITH DIFFERENTIAL/PLATELET - Abnormal; Notable for the following:       Result Value  HCT 34.4 (*)    All other components within normal limits  BASIC METABOLIC PANEL  BRAIN NATRIURETIC PEPTIDE  D-DIMER, QUANTITATIVE (NOT AT [facility])  I-STAT  TROPOININ, ED  CBG MONITORING, ED  I-STAT TROPOININ, ED    EKG  EKG Interpretation None       Radiology Dg Chest 2 View  Result Date: 07/19/2016 CLINICAL DATA:  Worsening shortness of breath, history CHF, COPD, non compliant with diuretics, type II diabetes mellitus, former smoker, hypertension EXAM: CHEST  2 VIEW COMPARISON:  03/22/2016 FINDINGS: Enlargement of cardiac silhouette with pulmonary vascular congestion. Atherosclerotic calcification aorta. Minimal RIGHT basilar atelectasis. Lungs otherwise clear. No pleural effusion or pneumothorax. Deformities at old LEFT posterior LEFT rib fractures. No acute osseous findings. IMPRESSION: Enlargement of cardiac silhouette with pulmonary vascular congestion. Minimal RIGHT basilar atelectasis. Aortic atherosclerosis. Electronically Signed   By: Lavonia Dana M.D.   On: 07/19/2016 19:12   Mm Diag Breast Tomo Bilateral  Result Date: 07/19/2016 CLINICAL DATA:  60 year old female referred for an enlarging right breast nodule seen on recent abdominal CT dated 06/13/2016. EXAM: 2D DIGITAL DIAGNOSTIC BILATERAL MAMMOGRAM WITH CAD AND ADJUNCT TOMO COMPARISON:  Previous exam(s). ACR Breast Density Category b: There are scattered areas of fibroglandular density. FINDINGS: There is a 19 mm oval, circumscribed mass within the right breast at 6 o'clock, corresponding to the mass seen on recent CT within the right breast. No suspicious mass, calcifications, or other abnormality is identified within the left breast. Mammographic images were processed with CAD. IMPRESSION: Incomplete evaluation. RECOMMENDATION: A targeted right breast ultrasound is needed for further evaluation of the right breast mass seen mammographically. An order was not available for the ultrasound to be performed today. The patient noted that she was short of breath during her time at the [facility] and stated that she thought she needed to be seen in the emergency room. Transport was called and  the patient was transported to the emergency room. I have discussed the findings and recommendations with the patient. Results were also provided in writing at the conclusion of the visit. If applicable, a reminder letter will be sent to the patient regarding the next appointment. BI-RADS CATEGORY  0: Incomplete. Need additional imaging evaluation and/or prior mammograms for comparison. Electronically Signed   By: Pamelia Hoit M.D.   On: 07/19/2016 18:23    Procedures Procedures (including critical care time)  Medications Ordered in ED Medications - No data to display   Initial Impression / Assessment and Plan / ED Course  I have reviewed the triage vital signs and the nursing notes.  Pertinent labs & imaging results that were available during my care of the patient were reviewed by me and considered in my medical decision making (see chart for details).  Clinical Course    60 y.o F With past medical history of CHF, COPD who presents to the ED today complaining of shortness of breath, worsening exertion beginning yesterday. Presentation to ED patient is in no apparent distress. She is currently on 4 L of oxygen and saturating 93%. Shallow breath sounds on exam with very mild wheezes in the upper lung fields. No lower extremity swelling. However, patient states she has not taken her fluid pills in over a week. DDX includes COPD exacerbation versus CHF exacerbation. BNP unremarkable. No sign of pulmonary edema. Doubt CHF. D-dimer within normal limits. Doubt PE. Low suspicion ACS. EKG unremarkable. Troponins within normal limits. Patient given DuoNeb and Solu-Medrol in ED with mild symptomatic improvement. However,  with any movement or attempt to sit up patient has severe worsening of her symptoms of difficulty breathing. Patient did ambulate in the ED and O2 saturations dropped into the 70s. Sitting at bedside after attempted to ambulate patient still saturating 83% on 2.5 L of O2. After allowing  patient to rest and cyst on the bed patient sats came up to the mid 90s. Patient's symptoms likely related to COPD exacerbation. Recommend hospital admission given significant hypoxia while ambulating.  Spoke with Dr. Hal Hope who will admit pt observation.  Patient was discussed with and seen by Dr. Rex Kras who agrees with the treatment plan.    Final Clinical Impressions(s) / ED Diagnoses   Final diagnoses:  COPD exacerbation [facility])    New Prescriptions New Prescriptions   No medications on file     Carlos Levering, PA-C 07/20/16 [place], MD 07/20/16 [phone]

## 2016-07-19 NOTE — ED Notes (Signed)
Pt. Ambulated down the hall, unable to maintain O2 sats while ambulating. Pt. O2 sats at 83% while ambulating and pt. redirected back to room. Pt. Placed back on 2.5 liters of O2 at 91%. PA notified of decreased oxygen level.

## 2016-07-19 NOTE — ED Notes (Signed)
Patient states that she is suppose to be on a fluid pill, but took herself off because she has cramping when she takes the pill. Patient states she is suppose to take diuretic twice a day, but only took 2 pills last week.  Patient has swelling to bilateral feet and ankles.

## 2016-07-19 NOTE — ED Triage Notes (Signed)
Per EMS- Patient was at the [facility] having a mammogram. Patient had increased SOB with exertion only. Patient normally wears home O2 @2 .5L/min via [place]. Sats with the 2.5L of O@ was 88%. Staff increased O2 to 4L/min via [place] and sats increased to 96%. Patient reports increased SOB since yesterday.

## 2016-07-20 ENCOUNTER — Encounter (HOSPITAL_COMMUNITY): Payer: Self-pay | Admitting: Internal Medicine

## 2016-07-20 DIAGNOSIS — I110 Hypertensive heart disease with heart failure: Secondary | ICD-10-CM | POA: Diagnosis present

## 2016-07-20 DIAGNOSIS — J441 Chronic obstructive pulmonary disease with (acute) exacerbation: Principal | ICD-10-CM

## 2016-07-20 DIAGNOSIS — Z9981 Dependence on supplemental oxygen: Secondary | ICD-10-CM | POA: Diagnosis not present

## 2016-07-20 DIAGNOSIS — Z87891 Personal history of nicotine dependence: Secondary | ICD-10-CM | POA: Diagnosis not present

## 2016-07-20 DIAGNOSIS — I5032 Chronic diastolic (congestive) heart failure: Secondary | ICD-10-CM | POA: Diagnosis present

## 2016-07-20 DIAGNOSIS — D573 Sickle-cell trait: Secondary | ICD-10-CM | POA: Diagnosis present

## 2016-07-20 DIAGNOSIS — Z79899 Other long term (current) drug therapy: Secondary | ICD-10-CM | POA: Diagnosis not present

## 2016-07-20 DIAGNOSIS — J209 Acute bronchitis, unspecified: Secondary | ICD-10-CM | POA: Diagnosis present

## 2016-07-20 DIAGNOSIS — J9601 Acute respiratory failure with hypoxia: Secondary | ICD-10-CM | POA: Diagnosis not present

## 2016-07-20 DIAGNOSIS — R0602 Shortness of breath: Secondary | ICD-10-CM | POA: Diagnosis present

## 2016-07-20 DIAGNOSIS — Z9049 Acquired absence of other specified parts of digestive tract: Secondary | ICD-10-CM | POA: Diagnosis not present

## 2016-07-20 DIAGNOSIS — Z9114 Patient's other noncompliance with medication regimen: Secondary | ICD-10-CM | POA: Diagnosis not present

## 2016-07-20 DIAGNOSIS — K219 Gastro-esophageal reflux disease without esophagitis: Secondary | ICD-10-CM | POA: Diagnosis present

## 2016-07-20 DIAGNOSIS — I519 Heart disease, unspecified: Secondary | ICD-10-CM | POA: Diagnosis not present

## 2016-07-20 DIAGNOSIS — J440 Chronic obstructive pulmonary disease with acute lower respiratory infection: Secondary | ICD-10-CM | POA: Diagnosis present

## 2016-07-20 DIAGNOSIS — J9600 Acute respiratory failure, unspecified whether with hypoxia or hypercapnia: Secondary | ICD-10-CM | POA: Diagnosis present

## 2016-07-20 DIAGNOSIS — Z801 Family history of malignant neoplasm of trachea, bronchus and lung: Secondary | ICD-10-CM | POA: Diagnosis not present

## 2016-07-20 DIAGNOSIS — E119 Type 2 diabetes mellitus without complications: Secondary | ICD-10-CM | POA: Diagnosis present

## 2016-07-20 DIAGNOSIS — G4733 Obstructive sleep apnea (adult) (pediatric): Secondary | ICD-10-CM | POA: Diagnosis not present

## 2016-07-20 DIAGNOSIS — E785 Hyperlipidemia, unspecified: Secondary | ICD-10-CM | POA: Diagnosis present

## 2016-07-20 DIAGNOSIS — Z833 Family history of diabetes mellitus: Secondary | ICD-10-CM | POA: Diagnosis not present

## 2016-07-20 LAB — BASIC METABOLIC PANEL
ANION GAP: 10 (ref 5–15)
BUN: 16 mg/dL (ref 6–20)
CO2: 28 mmol/L (ref 22–32)
Calcium: 9.5 mg/dL (ref 8.9–10.3)
Chloride: 102 mmol/L (ref 101–111)
Creatinine, Ser: 1.01 mg/dL — ABNORMAL HIGH (ref 0.44–1.00)
Glucose, Bld: 221 mg/dL — ABNORMAL HIGH (ref 65–99)
Potassium: 4 mmol/L (ref 3.5–5.1)
SODIUM: 140 mmol/L (ref 135–145)

## 2016-07-20 LAB — CBC
HEMATOCRIT: 35 % — AB (ref 36.0–46.0)
HEMOGLOBIN: 11.5 g/dL — AB (ref 12.0–15.0)
MCH: 26.5 pg (ref 26.0–34.0)
MCHC: 32.9 g/dL (ref 30.0–36.0)
MCV: 80.6 fL (ref 78.0–100.0)
Platelets: 238 10*3/uL (ref 150–400)
RBC: 4.34 MIL/uL (ref 3.87–5.11)
RDW: 15.4 % (ref 11.5–15.5)
WBC: 6.3 10*3/uL (ref 4.0–10.5)

## 2016-07-20 LAB — GLUCOSE, CAPILLARY
GLUCOSE-CAPILLARY: 241 mg/dL — AB (ref 65–99)
GLUCOSE-CAPILLARY: 234 mg/dL — AB (ref 65–99)
Glucose-Capillary: 190 mg/dL — ABNORMAL HIGH (ref 65–99)
Glucose-Capillary: 141 mg/dL — ABNORMAL HIGH (ref 65–99)

## 2016-07-20 MED ORDER — PREGABALIN 75 MG PO CAPS
75.0000 mg | ORAL_CAPSULE | Freq: Two times a day (BID) | ORAL | Status: DC
  Administered 2016-07-20 – 2016-07-22 (×6): 75 mg via ORAL
  Filled 2016-07-20 (×6): qty 1

## 2016-07-20 MED ORDER — HYDROCODONE-ACETAMINOPHEN 7.5-325 MG PO TABS: 1.0000 | ORAL_TABLET | Freq: Four times a day (QID) | ORAL | Status: DC | PRN

## 2016-07-20 MED ORDER — LUBIPROSTONE 24 MCG PO CAPS: 24.0000 ug | ORAL_CAPSULE | Freq: Every day | ORAL | Status: DC

## 2016-07-20 MED ORDER — LORATADINE 10 MG PO TABS
10.0000 mg | ORAL_TABLET | Freq: Every day | ORAL | Status: DC
  Administered 2016-07-20 – 2016-07-22 (×3): 10 mg via ORAL
  Filled 2016-07-20 (×3): qty 1

## 2016-07-20 MED ORDER — POTASSIUM CHLORIDE CRYS ER 10 MEQ PO TBCR
10.0000 meq | EXTENDED_RELEASE_TABLET | Freq: Every day | ORAL | Status: DC
  Administered 2016-07-20 – 2016-07-22 (×3): 10 meq via ORAL
  Filled 2016-07-20 (×3): qty 1

## 2016-07-20 MED ORDER — ALBUTEROL SULFATE (2.5 MG/3ML) 0.083% IN NEBU
2.5000 mg | INHALATION_SOLUTION | Freq: Four times a day (QID) | RESPIRATORY_TRACT | Status: DC
  Administered 2016-07-20 – 2016-07-22 (×10): 2.5 mg via RESPIRATORY_TRACT
  Filled 2016-07-20 (×10): qty 3

## 2016-07-20 MED ORDER — GLIPIZIDE 10 MG PO TABS
10.0000 mg | ORAL_TABLET | Freq: Two times a day (BID) | ORAL | Status: DC
  Administered 2016-07-20 – 2016-07-22 (×5): 10 mg via ORAL
  Filled 2016-07-20 (×5): qty 1

## 2016-07-20 MED ORDER — INSULIN ASPART 100 UNIT/ML ~~LOC~~ SOLN
0.0000 [IU] | Freq: Three times a day (TID) | SUBCUTANEOUS | Status: DC
  Administered 2016-07-20 (×2): 3 [IU] via SUBCUTANEOUS
  Administered 2016-07-20: 2 [IU] via SUBCUTANEOUS
  Administered 2016-07-21 – 2016-07-22 (×3): 3 [IU] via SUBCUTANEOUS

## 2016-07-20 MED ORDER — ZOLPIDEM TARTRATE 5 MG PO TABS
5.0000 mg | ORAL_TABLET | Freq: Every day | ORAL | Status: DC
  Administered 2016-07-20 – 2016-07-21 (×2): 5 mg via ORAL
  Filled 2016-07-20 (×2): qty 1

## 2016-07-20 MED ORDER — ONDANSETRON HCL 4 MG PO TABS: 4.0000 mg | ORAL_TABLET | Freq: Four times a day (QID) | ORAL | Status: DC | PRN

## 2016-07-20 MED ORDER — FUROSEMIDE 10 MG/ML IJ SOLN
20.0000 mg | Freq: Every day | INTRAMUSCULAR | Status: DC
  Administered 2016-07-20: 20 mg via INTRAVENOUS
  Filled 2016-07-20: qty 4

## 2016-07-20 MED ORDER — TIOTROPIUM BROMIDE MONOHYDRATE 18 MCG IN CAPS
18.0000 ug | ORAL_CAPSULE | Freq: Every day | RESPIRATORY_TRACT | Status: DC
  Administered 2016-07-20 – 2016-07-22 (×3): 18 ug via RESPIRATORY_TRACT
  Filled 2016-07-20: qty 5

## 2016-07-20 MED ORDER — TRIAMTERENE-HCTZ 37.5-25 MG PO TABS
1.0000 | ORAL_TABLET | Freq: Every day | ORAL | Status: DC
  Administered 2016-07-20 – 2016-07-22 (×3): 1 via ORAL
  Filled 2016-07-20 (×3): qty 1

## 2016-07-20 MED ORDER — FLUTICASONE PROPIONATE 50 MCG/ACT NA SUSP
2.0000 | Freq: Every day | NASAL | Status: DC
  Administered 2016-07-20 – 2016-07-22 (×3): 2 via NASAL
  Filled 2016-07-20: qty 16

## 2016-07-20 MED ORDER — DEXTROSE 5 % IV SOLN
500.0000 mg | INTRAVENOUS | Status: DC
  Administered 2016-07-20 – 2016-07-22 (×3): 500 mg via INTRAVENOUS
  Filled 2016-07-20 (×3): qty 500

## 2016-07-20 MED ORDER — GUAIFENESIN ER 600 MG PO TB12
600.0000 mg | ORAL_TABLET | Freq: Two times a day (BID) | ORAL | Status: DC
  Administered 2016-07-20 – 2016-07-22 (×6): 600 mg via ORAL
  Filled 2016-07-20 (×6): qty 1

## 2016-07-20 MED ORDER — PIOGLITAZONE HCL 30 MG PO TABS
30.0000 mg | ORAL_TABLET | Freq: Every day | ORAL | Status: DC
  Administered 2016-07-20 – 2016-07-22 (×3): 30 mg via ORAL
  Filled 2016-07-20 (×3): qty 1

## 2016-07-20 MED ORDER — IPRATROPIUM-ALBUTEROL 0.5-2.5 (3) MG/3ML IN SOLN: 3.0000 mL | RESPIRATORY_TRACT | Status: DC | PRN

## 2016-07-20 MED ORDER — ENOXAPARIN SODIUM 40 MG/0.4ML ~~LOC~~ SOLN
40.0000 mg | SUBCUTANEOUS | Status: DC
  Administered 2016-07-20 – 2016-07-22 (×3): 40 mg via SUBCUTANEOUS
  Filled 2016-07-20 (×4): qty 0.4

## 2016-07-20 MED ORDER — ONDANSETRON HCL 4 MG/2ML IJ SOLN: 4.0000 mg | Freq: Four times a day (QID) | INTRAMUSCULAR | Status: DC | PRN

## 2016-07-20 MED ORDER — LINAGLIPTIN 5 MG PO TABS
5.0000 mg | ORAL_TABLET | Freq: Every day | ORAL | Status: DC
  Administered 2016-07-20 – 2016-07-22 (×3): 5 mg via ORAL
  Filled 2016-07-20 (×3): qty 1

## 2016-07-20 MED ORDER — PANTOPRAZOLE SODIUM 40 MG PO TBEC
40.0000 mg | DELAYED_RELEASE_TABLET | Freq: Every day | ORAL | Status: DC
  Administered 2016-07-20 – 2016-07-22 (×3): 40 mg via ORAL
  Filled 2016-07-20 (×3): qty 1

## 2016-07-20 MED ORDER — ALLOPURINOL 100 MG PO TABS
100.0000 mg | ORAL_TABLET | Freq: Every day | ORAL | Status: DC
  Administered 2016-07-20 – 2016-07-22 (×3): 100 mg via ORAL
  Filled 2016-07-20 (×3): qty 1

## 2016-07-20 MED ORDER — PRAVASTATIN SODIUM 20 MG PO TABS
20.0000 mg | ORAL_TABLET | Freq: Every day | ORAL | Status: DC
  Administered 2016-07-20 – 2016-07-21 (×3): 20 mg via ORAL
  Filled 2016-07-20 (×3): qty 1

## 2016-07-20 MED ORDER — ALPRAZOLAM 1 MG PO TABS
1.0000 mg | ORAL_TABLET | Freq: Two times a day (BID) | ORAL | Status: DC | PRN
  Administered 2016-07-20 – 2016-07-22 (×2): 1 mg via ORAL
  Filled 2016-07-20 (×2): qty 1

## 2016-07-20 MED ORDER — IPRATROPIUM-ALBUTEROL 0.5-2.5 (3) MG/3ML IN SOLN
3.0000 mL | RESPIRATORY_TRACT | Status: DC
  Administered 2016-07-20: 3 mL via RESPIRATORY_TRACT
  Filled 2016-07-20: qty 3

## 2016-07-20 MED ORDER — AMLODIPINE BESYLATE 5 MG PO TABS
2.5000 mg | ORAL_TABLET | Freq: Every day | ORAL | Status: DC
  Administered 2016-07-20 – 2016-07-22 (×3): 2.5 mg via ORAL
  Filled 2016-07-20 (×3): qty 1

## 2016-07-20 MED ORDER — METHYLPREDNISOLONE SODIUM SUCC 40 MG IJ SOLR
40.0000 mg | Freq: Every day | INTRAMUSCULAR | Status: DC
  Administered 2016-07-20 – 2016-07-22 (×3): 40 mg via INTRAVENOUS
  Filled 2016-07-20 (×3): qty 1

## 2016-07-20 MED ORDER — MOMETASONE FURO-FORMOTEROL FUM 200-5 MCG/ACT IN AERO
2.0000 | INHALATION_SPRAY | Freq: Two times a day (BID) | RESPIRATORY_TRACT | Status: DC
  Administered 2016-07-20 – 2016-07-22 (×5): 2 via RESPIRATORY_TRACT
  Filled 2016-07-20: qty 8.8

## 2016-07-20 MED ORDER — LINACLOTIDE 145 MCG PO CAPS
145.0000 ug | ORAL_CAPSULE | Freq: Every day | ORAL | Status: DC
  Administered 2016-07-20 – 2016-07-22 (×3): 145 ug via ORAL
  Filled 2016-07-20 (×3): qty 1

## 2016-07-20 NOTE — Progress Notes (Signed)
Patient uses nasal pillows with home CPAP device. She did not bring her home apparatus with her for this hospital visit and declines the use of any mask the hospital provides. She agrees to continue supplemental oxygen during sleep but refuses CPAP. RT will continue to follow. RN aware.

## 2016-07-20 NOTE — H&P (Signed)
History and Physical    Stephanie Schultz [id number] DOB: 22-Apr-1956 DOA: 07/19/2016  PCP: Barbette Merino, MD  Patient coming from: Home.  Chief Complaint: Shortness of breath.  HPI: Stephanie Schultz is a 60 y.o. female with history of OSA, COPD, diastolic dysfunction, diabetes mellitus presents to the ER because of shortness of breath. Patient's symptoms started 24 hours ago with shortness of breath gradually worsening. Denies any chest pain or productive cough. In the ER patient was found to be wheezing and also has gained 15 pounds when compared to last month. Patient states she has stopped taking her Lasix as scheduled because of abdominal cramps and last taken was a week ago. Chest x-ray shows vascular congestion but BNP is normal. Patient will be admitted for acute respiratory failure with hypoxia is probably from a combination of COPD and fluid overload.   ED Course: On ambulation in the ER patient was dropping her sats. Patient was placed on nebulizer steroids.  Review of Systems: As per HPI, rest all negative.   Past Medical History:  Diagnosis Date  . Anemia    chronis  . Anxiety   . Arthritis   . Cancer ([place]) 1990   cervical   . COPD (chronic obstructive pulmonary disease) (HCC)    Pulmo: [name]  . Diabetes mellitus without complication ([place])    a. A1c 8.3 in 11/2015  . Emphysema   . Gallstones    s/p cholecystectomy  . GERD (gastroesophageal reflux disease)   . Gout   . Hx of cardiac catheterization    a. LHC at [facility] in [place], [place] 09/2008:  Normal coronary arteries EF 70%.  . Hyperlipidemia   . Hypertension   . Morbid obesity ([place])   . OSA on CPAP    CPAP at night   . Pneumonia   . Sickle cell trait ([place])   . Supplemental oxygen dependent    2L CONTINUOUSLEY  . Tobacco abuse    a. up to 3ppd from age 88 to 58, now 1/4 ppd (01/2013) >> Quit 10/2015    Past Surgical History:  Procedure Laterality Date  . CARDIAC CATHETERIZATION    . CHOLECYSTECTOMY N/A  12/13/2015   Procedure: LAPAROSCOPIC CHOLECYSTECTOMY;  Surgeon: Ralene Ok, MD;  Location: [facility] ORS;  Service: General;  Laterality: N/A;  . COLONOSCOPY  09/05/2012   Procedure: COLONOSCOPY;  Surgeon: Beryle Beams, MD;  Location: [facility];  Service: Endoscopy;  Laterality: N/A;  . COLONOSCOPY WITH PROPOFOL N/A 09/02/2015   Procedure: COLONOSCOPY WITH PROPOFOL;  Surgeon: Carol Ada, MD;  Location: [facility] ENDOSCOPY;  Service: Endoscopy;  Laterality: N/A;  . TUBAL LIGATION       reports that she quit smoking about 8 months ago. Her smoking use included Cigarettes. She has a 18.00 pack-year smoking history. She has never used smokeless tobacco. She reports that she does not drink alcohol or use drugs.  Allergies  Allergen Reactions  . Acetaminophen Other (See Comments)    Pt was told that she could not take Tylenol, does not know why    Family History  Problem Relation Age of Onset  . Other Father     unaware of father's medical history  . Diabetes Mother     alive @ 91  . Myasthenia gravis Mother   . Lung cancer Paternal Aunt   . Lung cancer Paternal Grandfather   . Other      multiple siblings a&w.  . Heart attack Neg Hx   . Heart failure Neg  Hx     Prior to Admission medications   Medication Sig Start Date End Date Taking? Authorizing Provider  albuterol (PROVENTIL) (2.5 MG/3ML) 0.083% nebulizer solution Take 2.5 mg by nebulization 4 (four) times daily.    Yes Historical Provider, MD  allopurinol (ZYLOPRIM) 100 MG tablet Take 100 mg by mouth daily.   Yes Historical Provider, MD  ALPRAZolam Duanne Moron) 1 MG tablet Take 1 mg by mouth 2 (two) times daily as needed for anxiety.  04/13/16  Yes Historical Provider, MD  AMITIZA 24 MCG capsule Take 1 capsule by mouth daily. 07/13/16  Yes Historical Provider, MD  amLODipine (NORVASC) 2.5 MG tablet Take 1 tablet (2.5 mg total) by mouth daily. 04/26/16  Yes Scott Joylene Draft, PA-C  budesonide-formoterol (SYMBICORT) 160-4.5 MCG/ACT inhaler Inhale  2 puffs into the lungs 2 (two) times daily. 05/03/15  Yes Collene Gobble, MD  fluticasone (FLONASE) 50 MCG/ACT nasal spray INHALE 2 SPRAYS IN EACH NOSTRIL ONCE DAILY Patient taking differently: INHALE 1 SPRAYS IN EACH NOSTRIL ONCE DAILY 05/18/16  Yes Collene Gobble, MD  furosemide (LASIX) 20 MG tablet Take 20 mg by mouth daily.   Yes Historical Provider, MD  glipiZIDE (GLUCOTROL) 10 MG tablet Take 10 mg by mouth 2 (two) times daily before a meal.    Yes Historical Provider, MD  guaiFENesin (MUCINEX) 600 MG 12 hr tablet Take 1 tablet (600 mg total) by mouth 2 (two) times daily. 03/24/16  Yes Belkys A Regalado, MD  HYDROcodone-acetaminophen (NORCO) 7.5-325 MG tablet Take 1 tablet by mouth every 6 (six) hours as needed for moderate pain.   Yes Historical Provider, MD  JANUVIA 100 MG tablet Take 100 mg by mouth daily. 05/24/16  Yes Historical Provider, MD  Linaclotide Rolan Lipa) 145 MCG CAPS capsule Take 145 mcg by mouth daily.    Yes Historical Provider, MD  loratadine (CLARITIN) 10 MG tablet Take 10 mg by mouth daily.   Yes Historical Provider, MD  nystatin cream (MYCOSTATIN) Apply 1 application topically 2 (two) times daily as needed (yeast infection.).    Yes Historical Provider, MD  Olopatadine HCl (PAZEO) 0.7 % SOLN Place 1 drop into both eyes daily as needed (allergies).    Yes Historical Provider, MD  ondansetron (ZOFRAN) 4 MG tablet Take 1 tablet (4 mg total) by mouth every 6 (six) hours as needed for nausea or vomiting. 05/30/16  Yes Bethany Molt, DO  OXYGEN Inhale 2.5 L into the lungs continuous.   Yes Historical Provider, MD  pantoprazole (PROTONIX) 40 MG tablet Take 2 tablets (80 mg total) by mouth daily at 12 noon. Patient taking differently: Take 40 mg by mouth daily at 12 noon.  03/24/16  Yes Belkys A Regalado, MD  pioglitazone (ACTOS) 30 MG tablet Take 30 mg by mouth daily. 12/29/15  Yes Historical Provider, MD  pravastatin (PRAVACHOL) 20 MG tablet Take 20 mg by mouth daily.   Yes Historical  Provider, MD  pregabalin (LYRICA) 75 MG capsule Take 75 mg by mouth 2 (two) times daily.   Yes Historical Provider, MD  PROAIR HFA 108 (90 Base) MCG/ACT inhaler INHALE 2 PUFFS BY MOUTH EVERY 4 HOURS AS NEEDED FOR WHEEZING 06/14/16  Yes Collene Gobble, MD  sodium chloride (OCEAN) 0.65 % SOLN nasal spray Place 1 spray into both nostrils as needed for congestion.   Yes Historical Provider, MD  tiotropium (SPIRIVA) 18 MCG inhalation capsule Place 1 capsule (18 mcg total) into inhaler and inhale daily. 05/03/15  Yes Collene Gobble, MD  triamterene-hydrochlorothiazide (DYAZIDE) 37.5-25 MG per capsule Take 1 capsule by mouth daily.   Yes Historical Provider, MD  zolpidem (AMBIEN) 10 MG tablet Take 10 mg by mouth at bedtime.   Yes Historical Provider, MD  fluticasone (FLONASE) 50 MCG/ACT nasal spray Place 1 spray into both nostrils daily. Patient not taking: Reported on 05/30/2016 03/24/16   Elmarie Shiley, MD    Physical Exam: Vitals:   07/19/16 2300 07/20/16 0014 07/20/16 0015 07/20/16 0100  BP: 134/66 120/85  125/75  Pulse: 87 84  80  Resp: (!) 30  18 (!) 27  Temp:      TempSrc:      SpO2: 96% 94%  94%  Weight:      Height:          Constitutional: Not in distress. Vitals:   07/19/16 2300 07/20/16 0014 07/20/16 0015 07/20/16 0100  BP: 134/66 120/85  125/75  Pulse: 87 84  80  Resp: (!) 30  18 (!) 27  Temp:      TempSrc:      SpO2: 96% 94%  94%  Weight:      Height:       Eyes: Anicteric no pallor. ENMT: No discharge from the ears eyes nose or mouth. Neck: No JVD appreciated no mass felt. Respiratory: Mild expiratory wheeze or crepitations. Cardiovascular: S1 and S2 heard. Abdomen: Soft nontender bowel sounds present. No guarding or rigidity. Musculoskeletal: No edema. Skin: No rash. Neurologic: Alert awake oriented to time place and person. Moves all extremities. Psychiatric: Appears normal.   Labs on Admission: I have personally reviewed following labs and imaging  studies  CBC:  Recent Labs Lab 07/19/16 2055  WBC 6.8  NEUTROABS 4.2  HGB 12.0  HCT 34.4*  MCV 78.9  PLT 0000000   Basic Metabolic Panel:  Recent Labs Lab 07/19/16 1755  NA 141  K 3.9  CL 106  CO2 28  GLUCOSE 79  BUN 14  CREATININE 0.96  CALCIUM 9.2   GFR: Estimated Creatinine Clearance: 70.6 mL/min (by C-G formula based on SCr of 0.96 mg/dL). Liver Function Tests: No results for input(s): AST, ALT, ALKPHOS, BILITOT, PROT, ALBUMIN in the last 168 hours. No results for input(s): LIPASE, AMYLASE in the last 168 hours. No results for input(s): AMMONIA in the last 168 hours. Coagulation Profile: No results for input(s): INR, PROTIME in the last 168 hours. Cardiac Enzymes: No results for input(s): CKTOTAL, CKMB, CKMBINDEX, TROPONINI in the last 168 hours. BNP (last 3 results) No results for input(s): PROBNP in the last 8760 hours. HbA1C: No results for input(s): HGBA1C in the last 72 hours. CBG:  Recent Labs Lab 07/19/16 2014  GLUCAP 84   Lipid Profile: No results for input(s): CHOL, HDL, LDLCALC, TRIG, CHOLHDL, LDLDIRECT in the last 72 hours. Thyroid Function Tests: No results for input(s): TSH, T4TOTAL, FREET4, T3FREE, THYROIDAB in the last 72 hours. Anemia Panel: No results for input(s): VITAMINB12, FOLATE, FERRITIN, TIBC, IRON, RETICCTPCT in the last 72 hours. Urine analysis:    Component Value Date/Time   COLORURINE YELLOW 12/03/2015 0011   APPEARANCEUR CLEAR 12/03/2015 0011   LABSPEC 1.026 12/03/2015 0011   PHURINE 6.0 12/03/2015 0011   GLUCOSEU >1000 (A) 12/03/2015 0011   HGBUR NEGATIVE 12/03/2015 0011   BILIRUBINUR NEGATIVE 12/03/2015 0011   KETONESUR NEGATIVE 12/03/2015 0011   PROTEINUR NEGATIVE 12/03/2015 0011   UROBILINOGEN 0.2 03/31/2015 1231   NITRITE NEGATIVE 12/03/2015 0011   LEUKOCYTESUR NEGATIVE 12/03/2015 0011   Sepsis Labs: @LABRCNTIP (procalcitonin:4,lacticidven:4) )No  results found for this or any previous visit (from the past 240  hour(s)).   Radiological Exams on Admission: Dg Chest 2 View  Result Date: 07/19/2016 CLINICAL DATA:  Worsening shortness of breath, history CHF, COPD, non compliant with diuretics, type II diabetes mellitus, former smoker, hypertension EXAM: CHEST  2 VIEW COMPARISON:  03/22/2016 FINDINGS: Enlargement of cardiac silhouette with pulmonary vascular congestion. Atherosclerotic calcification aorta. Minimal RIGHT basilar atelectasis. Lungs otherwise clear. No pleural effusion or pneumothorax. Deformities at old LEFT posterior LEFT rib fractures. No acute osseous findings. IMPRESSION: Enlargement of cardiac silhouette with pulmonary vascular congestion. Minimal RIGHT basilar atelectasis. Aortic atherosclerosis. Electronically Signed   By: Lavonia Dana M.D.   On: 07/19/2016 19:12   Mm Diag Breast Tomo Bilateral  Result Date: 07/19/2016 CLINICAL DATA:  60 year old female referred for an enlarging right breast nodule seen on recent abdominal CT dated 06/13/2016. EXAM: 2D DIGITAL DIAGNOSTIC BILATERAL MAMMOGRAM WITH CAD AND ADJUNCT TOMO COMPARISON:  Previous exam(s). ACR Breast Density Category b: There are scattered areas of fibroglandular density. FINDINGS: There is a 19 mm oval, circumscribed mass within the right breast at 6 o'clock, corresponding to the mass seen on recent CT within the right breast. No suspicious mass, calcifications, or other abnormality is identified within the left breast. Mammographic images were processed with CAD. IMPRESSION: Incomplete evaluation. RECOMMENDATION: A targeted right breast ultrasound is needed for further evaluation of the right breast mass seen mammographically. An order was not available for the ultrasound to be performed today. The patient noted that she was short of breath during her time at the [facility] and stated that she thought she needed to be seen in the emergency room. Transport was called and the patient was transported to the emergency room. I have discussed  the findings and recommendations with the patient. Results were also provided in writing at the conclusion of the visit. If applicable, a reminder letter will be sent to the patient regarding the next appointment. BI-RADS CATEGORY  0: Incomplete. Need additional imaging evaluation and/or prior mammograms for comparison. Electronically Signed   By: Pamelia Hoit M.D.   On: 07/19/2016 18:23    EKG: Independently reviewed. Normal sinus rhythm.  Assessment/Plan Active Problems:   Obstructive sleep apnea   Diastolic dysfunction   DM type 2 (diabetes mellitus, type 2) (HCC)   Acute respiratory failure with hypoxia (HCC)   COPD exacerbation (HCC)   Acute respiratory failure ([place])    1. Acute respiratory failure with hypoxia probably a combination of COPD and fluid overload - last EF measured was in February 2017 which showed an EF of 60-65% with grade 1 diastolic dysfunction. At this time I have placed patient on Solu-Medrol nebulizer IV steroids and Zithromax for COPD exacerbation. And for fluid overload and diastolic dysfunction I have restarted patient's Lasix 20 mg IV daily. Closely follow intake output metabolic panel and daily weights. 2. OSA on CPAP - patient states she is compliant. 3. Diabetes mellitus type 2 - on Actos, glipizide and Januvia. If patient has significant fluid retention than Actos may need to be discontinued. Closely follow CBGs now is that patient is on Solu-Medrol. 4. Hypertension on amlodipine and triamterene HCTZ which will be continued. 5. History of gout and hyperlipidemia on allopurinol and statins.   DVT prophylaxis: Lovenox. Code Status: Full code.  Family Communication: Discussed with patient.  Disposition Plan: Home.  Consults called: None.  Admission status: Observation.    Rise Patience MD [facility] Pager [phone].  If 7PM-7AM, please contact night-coverage [email or web address] Password [email or web address]  07/20/2016, 2:10 AM

## 2016-07-20 NOTE — Progress Notes (Signed)
[facility]  I have examined the patient and reviewed the H and P.   Subjective: feels dyspnea has improved. Continues to have cough productive of large amounts of sputum. No fever or chills.  Principal Problem:   Acute respiratory failure with hypoxia /   COPD exacerbation - cont Solumedrol, Nebs  Active Problems:   Obstructive sleep apnea - CPAP    Diastolic dysfunction - on PRN Lasix at home    DM type 2   - cont ISS and Glucotrol  Debbe Odea, MD

## 2016-07-21 DIAGNOSIS — I519 Heart disease, unspecified: Secondary | ICD-10-CM

## 2016-07-21 DIAGNOSIS — G4733 Obstructive sleep apnea (adult) (pediatric): Secondary | ICD-10-CM

## 2016-07-21 LAB — GLUCOSE, CAPILLARY
GLUCOSE-CAPILLARY: 209 mg/dL — AB (ref 65–99)
GLUCOSE-CAPILLARY: 225 mg/dL — AB (ref 65–99)
GLUCOSE-CAPILLARY: 225 mg/dL — AB (ref 65–99)
GLUCOSE-CAPILLARY: 153 mg/dL — AB (ref 65–99)
Glucose-Capillary: 112 mg/dL — ABNORMAL HIGH (ref 65–99)

## 2016-07-21 NOTE — Progress Notes (Signed)
Ambulated in hallway with O2.  Pt became increasingly SOB and assisted back to room via wheelchair. VS and O2 sat obtained -see flowsheet.  Respiratory called for PRN breathing tx. Stacey Drain

## 2016-07-21 NOTE — Progress Notes (Signed)
PROGRESS NOTE    Stephanie Schultz  [id number] DOB: Jun 24, 1956 DOA: 07/19/2016  PCP: Barbette Merino, MD   Brief Narrative:  Stephanie Schultz is a 60 y.o. female with history of OSA, COPD, diastolic dysfunction, diabetes mellitus presents to the ER because of shortness of breath. She is admitted for a COPD exacerbation.   Subjective: Shortness of breath is better. Coughing up less sputum now.   Assessment & Plan:   Principal Problem:   Acute respiratory failure with hypoxia  /  COPD exacerbation - cont Solumedrol today - hopefully transition to Prednisone tomorrow - cont Nebs  Active Problems:   Obstructive sleep apnea - CPAP    Diastolic dysfunction - on PRN Lasix at home- currently not fluid overloaded    DM type 2   - cont ISS and Glucotrol   DVT prophylaxis: Lovenox Code Status: Full code Family Communication:  Disposition Plan: home in 1-2 days Consultants:    Procedures:    Antimicrobials:  Anti-infectives    Start     Dose/Rate Route Frequency Ordered Stop   07/20/16 0215  azithromycin (ZITHROMAX) 500 mg in dextrose 5 % 250 mL IVPB     500 mg 250 mL/hr over 60 Minutes Intravenous Every 24 hours 07/20/16 0209         Objective: Vitals:   07/20/16 2126 07/21/16 0627 07/21/16 0755 07/21/16 1149  BP:  (!) 120/95    Pulse:  85    Resp:  (!) 22    Temp:  97.6 F (36.4 C)    TempSrc:  Oral    SpO2: 98% 94% 98% (!) 87%  Weight:      Height:        Intake/Output Summary (Last 24 hours) at 07/21/16 1244 Last data filed at 07/21/16 0656  Gross per 24 hour  Intake              370 ml  Output              850 ml  Net             -480 ml   Filed Weights   07/19/16 1631 07/20/16 0211  Weight: 104.8 kg (231 lb) 106.6 kg (235 lb)    Examination: General exam: Appears comfortable  HEENT: PERRLA, oral mucosa moist, no sclera icterus or thrush Respiratory system: Clear to auscultation. Respiratory effort normal. Cardiovascular system: S1 & S2 heard, RRR.   No murmurs  Gastrointestinal system: Abdomen soft, non-tender, nondistended. Normal bowel sound. No organomegaly Central nervous system: Alert and oriented. No focal neurological deficits. Extremities: No cyanosis, clubbing or edema Skin: No rashes or ulcers Psychiatry:  Mood & affect appropriate.     Data Reviewed: I have personally reviewed following labs and imaging studies  CBC:  Recent Labs Lab 07/19/16 2055 07/20/16 0533  WBC 6.8 6.3  NEUTROABS 4.2  --   HGB 12.0 11.5*  HCT 34.4* 35.0*  MCV 78.9 80.6  PLT 243 [id number]   Basic Metabolic Panel:  Recent Labs Lab 07/19/16 1755 07/20/16 0533  NA 141 140  K 3.9 4.0  CL 106 102  CO2 28 28  GLUCOSE 79 221*  BUN 14 16  CREATININE 0.96 1.01*  CALCIUM 9.2 9.5   GFR: Estimated Creatinine Clearance: 67.8 mL/min (by C-G formula based on SCr of 1.01 mg/dL). Liver Function Tests: No results for input(s): AST, ALT, ALKPHOS, BILITOT, PROT, ALBUMIN in the last 168 hours. No results for input(s): LIPASE, AMYLASE in the last 168 hours. No  results for input(s): AMMONIA in the last 168 hours. Coagulation Profile: No results for input(s): INR, PROTIME in the last 168 hours. Cardiac Enzymes: No results for input(s): CKTOTAL, CKMB, CKMBINDEX, TROPONINI in the last 168 hours. BNP (last 3 results) No results for input(s): PROBNP in the last 8760 hours. HbA1C: No results for input(s): HGBA1C in the last 72 hours. CBG:  Recent Labs Lab 07/20/16 1640 07/20/16 2115 07/21/16 0735 07/21/16 1151 07/21/16 1152  GLUCAP 234* 141* 112* 225* 209*   Lipid Profile: No results for input(s): CHOL, HDL, LDLCALC, TRIG, CHOLHDL, LDLDIRECT in the last 72 hours. Thyroid Function Tests: No results for input(s): TSH, T4TOTAL, FREET4, T3FREE, THYROIDAB in the last 72 hours. Anemia Panel: No results for input(s): VITAMINB12, FOLATE, FERRITIN, TIBC, IRON, RETICCTPCT in the last 72 hours. Urine analysis:    Component Value Date/Time   COLORURINE  YELLOW 12/03/2015 0011   APPEARANCEUR CLEAR 12/03/2015 0011   LABSPEC 1.026 12/03/2015 0011   PHURINE 6.0 12/03/2015 0011   GLUCOSEU >1000 (A) 12/03/2015 0011   HGBUR NEGATIVE 12/03/2015 0011   BILIRUBINUR NEGATIVE 12/03/2015 0011   KETONESUR NEGATIVE 12/03/2015 0011   PROTEINUR NEGATIVE 12/03/2015 0011   UROBILINOGEN 0.2 03/31/2015 1231   NITRITE NEGATIVE 12/03/2015 0011   LEUKOCYTESUR NEGATIVE 12/03/2015 0011   Sepsis Labs: @LABRCNTIP (procalcitonin:4,lacticidven:4) )No results found for this or any previous visit (from the past 240 hour(s)).       Radiology Studies: Dg Chest 2 View  Result Date: 07/19/2016 CLINICAL DATA:  Worsening shortness of breath, history CHF, COPD, non compliant with diuretics, type II diabetes mellitus, former smoker, hypertension EXAM: CHEST  2 VIEW COMPARISON:  03/22/2016 FINDINGS: Enlargement of cardiac silhouette with pulmonary vascular congestion. Atherosclerotic calcification aorta. Minimal RIGHT basilar atelectasis. Lungs otherwise clear. No pleural effusion or pneumothorax. Deformities at old LEFT posterior LEFT rib fractures. No acute osseous findings. IMPRESSION: Enlargement of cardiac silhouette with pulmonary vascular congestion. Minimal RIGHT basilar atelectasis. Aortic atherosclerosis. Electronically Signed   By: Lavonia Dana M.D.   On: 07/19/2016 19:12   Mm Diag Breast Tomo Bilateral  Result Date: 07/19/2016 CLINICAL DATA:  60 year old female referred for an enlarging right breast nodule seen on recent abdominal CT dated 06/13/2016. EXAM: 2D DIGITAL DIAGNOSTIC BILATERAL MAMMOGRAM WITH CAD AND ADJUNCT TOMO COMPARISON:  Previous exam(s). ACR Breast Density Category b: There are scattered areas of fibroglandular density. FINDINGS: There is a 19 mm oval, circumscribed mass within the right breast at 6 o'clock, corresponding to the mass seen on recent CT within the right breast. No suspicious mass, calcifications, or other abnormality is identified within  the left breast. Mammographic images were processed with CAD. IMPRESSION: Incomplete evaluation. RECOMMENDATION: A targeted right breast ultrasound is needed for further evaluation of the right breast mass seen mammographically. An order was not available for the ultrasound to be performed today. The patient noted that she was short of breath during her time at the [facility] and stated that she thought she needed to be seen in the emergency room. Transport was called and the patient was transported to the emergency room. I have discussed the findings and recommendations with the patient. Results were also provided in writing at the conclusion of the visit. If applicable, a reminder letter will be sent to the patient regarding the next appointment. BI-RADS CATEGORY  0: Incomplete. Need additional imaging evaluation and/or prior mammograms for comparison. Electronically Signed   By: Pamelia Hoit M.D.   On: 07/19/2016 18:23      Scheduled Meds: .  albuterol  2.5 mg Nebulization QID  . allopurinol  100 mg Oral Daily  . amLODipine  2.5 mg Oral Daily  . azithromycin  500 mg Intravenous Q24H  . enoxaparin (LOVENOX) injection  40 mg Subcutaneous Q24H  . fluticasone  2 spray Each Nare Daily  . glipiZIDE  10 mg Oral BID AC  . guaiFENesin  600 mg Oral BID  . insulin aspart  0-9 Units Subcutaneous TID WC  . linaclotide  145 mcg Oral Daily  . linagliptin  5 mg Oral Daily  . loratadine  10 mg Oral Daily  . methylPREDNISolone (SOLU-MEDROL) injection  40 mg Intravenous Daily  . mometasone-formoterol  2 puff Inhalation BID  . pantoprazole  40 mg Oral Q1200  . pioglitazone  30 mg Oral Daily  . potassium chloride  10 mEq Oral Daily  . pravastatin  20 mg Oral Daily  . pregabalin  75 mg Oral BID  . tiotropium  18 mcg Inhalation Daily  . triamterene-hydrochlorothiazide  1 tablet Oral Daily  . zolpidem  5 mg Oral QHS   Continuous Infusions:    LOS: 1 day    Time spent in minutes: 21    [place],  MD [facility] Pager: [email or web address] Password [email or web address] 07/21/2016, 12:44 PM

## 2016-07-21 NOTE — Progress Notes (Signed)
Patient continues to decline the use of nocturnal CPAP without the use of her home nasal pillows. Awaiting family to bring her home apparatus to the hospital for nocturnal use. Until then, she agrees to remain on supplemental oxygen with increased HOB. RT will continue to follow.

## 2016-07-22 DIAGNOSIS — J209 Acute bronchitis, unspecified: Secondary | ICD-10-CM

## 2016-07-22 LAB — BASIC METABOLIC PANEL
Anion gap: 8 (ref 5–15)
BUN: 22 mg/dL — AB (ref 6–20)
CHLORIDE: 103 mmol/L (ref 101–111)
CO2: 31 mmol/L (ref 22–32)
CREATININE: 0.88 mg/dL (ref 0.44–1.00)
Calcium: 9.2 mg/dL (ref 8.9–10.3)
GFR calc Af Amer: >60 mL/min (ref >60)
GFR calc non Af Amer: >60 mL/min (ref >60)
Glucose, Bld: 115 mg/dL — ABNORMAL HIGH (ref 65–99)
POTASSIUM: 4 mmol/L (ref 3.5–5.1)
SODIUM: 142 mmol/L (ref 135–145)

## 2016-07-22 LAB — GLUCOSE, CAPILLARY
GLUCOSE-CAPILLARY: 92 mg/dL (ref 65–99)
GLUCOSE-CAPILLARY: 231 mg/dL — AB (ref 65–99)

## 2016-07-22 MED ORDER — PANTOPRAZOLE SODIUM 40 MG PO TBEC: 40.0000 mg | DELAYED_RELEASE_TABLET | Freq: Every day | ORAL | Status: DC

## 2016-07-22 MED ORDER — GUAIFENESIN ER 600 MG PO TB12: 600.0000 mg | ORAL_TABLET | Freq: Two times a day (BID) | ORAL | 0 refills | Status: DC

## 2016-07-22 MED ORDER — AZITHROMYCIN 250 MG PO TABS: 250.0000 mg | ORAL_TABLET | Freq: Every day | ORAL | 0 refills | Status: DC

## 2016-07-22 MED ORDER — PREDNISONE 10 MG PO TABS: 60.0000 mg | ORAL_TABLET | Freq: Every day | ORAL | 0 refills | Status: DC

## 2016-07-22 NOTE — Progress Notes (Signed)
Pt ambulated 300 ft in hallway, Oxygen sat 98% on 2L and remained 91-96% during ambulation.  Pt was SOB towards the end of her walk. MD notified.

## 2016-07-22 NOTE — Discharge Summary (Signed)
Physician Discharge Summary  Stephanie Schultz [id number] DOB: Jul 28, 1956 DOA: 07/19/2016  PCP: Barbette Merino, MD  Admit date: 07/19/2016 Discharge date: 07/22/2016  Admitted From: home  Disposition:  home   Recommendations for Outpatient Follow-up:  1. Consider pulmonary rehab  Home Health:  none  Equipment/Devices:  none    Discharge Condition:  stable   CODE STATUS:  Full code   Diet recommendation:  Diabetic heart healthy  Consultations:  none    Discharge Diagnoses:  Principal Problem:   Acute respiratory failure with hypoxia (HCC) Active Problems:   COPD exacerbation (HCC)   Acute bronchitis   Obstructive sleep apnea   Diastolic dysfunction   DM type 2 (diabetes mellitus, type 2) (HCC)   Acute respiratory failure (HCC)    Subjective: Cough and dyspnea much better.   Brief Summary: Stephanie Schultz a 60 y.o.femalewith history of OSA, COPD, diastolic dysfunction, diabetes mellitus presents to the ER because of shortness of breath. She is admitted for a COPD exacerbation.    Hospital Course:  Principal Problem:   Acute respiratory failure with hypoxia  /  COPD exacerbation - improved with IV steroids, aggressive Neb treatments and Zithromax - weaned Solumedrol to Prednisone - cont Nebs at home- cont Zithromax to complete course - O2 has been weaned from 4-5 L on admission to her baseline of 2 L and she has adequate O2 saturations at this level with exertion  Active Problems: Obstructive sleep apnea - CPAP  Diastolic dysfunction - on PRN Lasix at home- currently not fluid overloaded  DM type 2  - cont ISS and Glucotrol  Discharge Instructions  Discharge Instructions    Discharge instructions    Complete by:  As directed   Low sodium, heart healthy, diabetic diet   Increase activity slowly    Complete by:  As directed       Medication List    TAKE these medications   albuterol (2.5 MG/3ML) 0.083% nebulizer solution Commonly known as:   PROVENTIL Take 2.5 mg by nebulization 4 (four) times daily.   PROAIR HFA 108 (90 Base) MCG/ACT inhaler Generic drug:  albuterol INHALE 2 PUFFS BY MOUTH EVERY 4 HOURS AS NEEDED FOR WHEEZING   allopurinol 100 MG tablet Commonly known as:  ZYLOPRIM Take 100 mg by mouth daily.   ALPRAZolam 1 MG tablet Commonly known as:  XANAX Take 1 mg by mouth 2 (two) times daily as needed for anxiety.   AMITIZA 24 MCG capsule Generic drug:  lubiprostone Take 1 capsule by mouth daily.   amLODipine 2.5 MG tablet Commonly known as:  NORVASC Take 1 tablet (2.5 mg total) by mouth daily.   azithromycin 250 MG tablet Commonly known as:  ZITHROMAX Take 1 tablet (250 mg total) by mouth daily.   budesonide-formoterol 160-4.5 MCG/ACT inhaler Commonly known as:  SYMBICORT Inhale 2 puffs into the lungs 2 (two) times daily.   fluticasone 50 MCG/ACT nasal spray Commonly known as:  FLONASE Place 1 spray into both nostrils daily. What changed:  Another medication with the same name was removed. Continue taking this medication, and follow the directions you see here.   furosemide 20 MG tablet Commonly known as:  LASIX Take 20 mg by mouth daily.   glipiZIDE 10 MG tablet Commonly known as:  GLUCOTROL Take 10 mg by mouth 2 (two) times daily before a meal.   guaiFENesin 600 MG 12 hr tablet Commonly known as:  MUCINEX Take 1 tablet (600 mg total) by mouth 2 (two) times daily.  HYDROcodone-acetaminophen 7.5-325 MG tablet Commonly known as:  NORCO Take 1 tablet by mouth every 6 (six) hours as needed for moderate pain.   JANUVIA 100 MG tablet Generic drug:  sitaGLIPtin Take 100 mg by mouth daily.   LINZESS 145 MCG Caps capsule Generic drug:  linaclotide Take 145 mcg by mouth daily.   loratadine 10 MG tablet Commonly known as:  CLARITIN Take 10 mg by mouth daily.   nystatin cream Commonly known as:  MYCOSTATIN Apply 1 application topically 2 (two) times daily as needed (yeast infection.).    ondansetron 4 MG tablet Commonly known as:  ZOFRAN Take 1 tablet (4 mg total) by mouth every 6 (six) hours as needed for nausea or vomiting.   OXYGEN Inhale 2.5 L into the lungs continuous.   pantoprazole 40 MG tablet Commonly known as:  PROTONIX Take 1 tablet (40 mg total) by mouth daily at 12 noon.   PAZEO 0.7 % Soln Generic drug:  Olopatadine HCl Place 1 drop into both eyes daily as needed (allergies).   pioglitazone 30 MG tablet Commonly known as:  ACTOS Take 30 mg by mouth daily.   pravastatin 20 MG tablet Commonly known as:  PRAVACHOL Take 20 mg by mouth daily.   predniSONE 10 MG tablet Commonly known as:  DELTASONE Take 6 tablets (60 mg total) by mouth daily with breakfast. Taper by 10 mg daily until tablets are finished   pregabalin 75 MG capsule Commonly known as:  LYRICA Take 75 mg by mouth 2 (two) times daily.   sodium chloride 0.65 % Soln nasal spray Commonly known as:  OCEAN Place 1 spray into both nostrils as needed for congestion.   tiotropium 18 MCG inhalation capsule Commonly known as:  SPIRIVA Place 1 capsule (18 mcg total) into inhaler and inhale daily.   triamterene-hydrochlorothiazide 37.5-25 MG capsule Commonly known as:  DYAZIDE Take 1 capsule by mouth daily.   zolpidem 10 MG tablet Commonly known as:  AMBIEN Take 10 mg by mouth at bedtime.       Allergies  Allergen Reactions  . Acetaminophen Other (See Comments)    Pt was told that she could not take Tylenol, does not know why     Procedures/Studies:  Dg Chest 2 View  Result Date: 07/19/2016 CLINICAL DATA:  Worsening shortness of breath, history CHF, COPD, non compliant with diuretics, type II diabetes mellitus, former smoker, hypertension EXAM: CHEST  2 VIEW COMPARISON:  03/22/2016 FINDINGS: Enlargement of cardiac silhouette with pulmonary vascular congestion. Atherosclerotic calcification aorta. Minimal RIGHT basilar atelectasis. Lungs otherwise clear. No pleural effusion or  pneumothorax. Deformities at old LEFT posterior LEFT rib fractures. No acute osseous findings. IMPRESSION: Enlargement of cardiac silhouette with pulmonary vascular congestion. Minimal RIGHT basilar atelectasis. Aortic atherosclerosis. Electronically Signed   By: Lavonia Dana M.D.   On: 07/19/2016 19:12   Mm Diag Breast Tomo Bilateral  Result Date: 07/19/2016 CLINICAL DATA:  60 year old female referred for an enlarging right breast nodule seen on recent abdominal CT dated 06/13/2016. EXAM: 2D DIGITAL DIAGNOSTIC BILATERAL MAMMOGRAM WITH CAD AND ADJUNCT TOMO COMPARISON:  Previous exam(s). ACR Breast Density Category b: There are scattered areas of fibroglandular density. FINDINGS: There is a 19 mm oval, circumscribed mass within the right breast at 6 o'clock, corresponding to the mass seen on recent CT within the right breast. No suspicious mass, calcifications, or other abnormality is identified within the left breast. Mammographic images were processed with CAD. IMPRESSION: Incomplete evaluation. RECOMMENDATION: A targeted right breast ultrasound is  needed for further evaluation of the right breast mass seen mammographically. An order was not available for the ultrasound to be performed today. The patient noted that she was short of breath during her time at the [facility] and stated that she thought she needed to be seen in the emergency room. Transport was called and the patient was transported to the emergency room. I have discussed the findings and recommendations with the patient. Results were also provided in writing at the conclusion of the visit. If applicable, a reminder letter will be sent to the patient regarding the next appointment. BI-RADS CATEGORY  0: Incomplete. Need additional imaging evaluation and/or prior mammograms for comparison. Electronically Signed   By: Pamelia Hoit M.D.   On: 07/19/2016 18:23       Discharge Exam: Vitals:   07/21/16 2214 07/22/16 0441  BP: 121/61 (!) 113/58   Pulse: 87 73  Resp: 18 18  Temp: 98.5 F (36.9 C) 97.9 F (36.6 C)   Vitals:   07/21/16 2214 07/22/16 0441 07/22/16 0738 07/22/16 1209  BP: 121/61 (!) 113/58    Pulse: 87 73    Resp: 18 18    Temp: 98.5 F (36.9 C) 97.9 F (36.6 C)    TempSrc: Oral Oral    SpO2: 96% 96% 99% 94%  Weight:  105 kg (231 lb 8 oz)    Height:        General: Pt is alert, awake, not in acute distress Cardiovascular: RRR, S1/S2 +, no rubs, no gallops Respiratory: CTA bilaterally, no wheezing, no rhonchi Abdominal: Soft, NT, ND, bowel sounds + Extremities: no edema, no cyanosis    The results of significant diagnostics from this hospitalization (including imaging, microbiology, ancillary and laboratory) are listed below for reference.     Microbiology: No results found for this or any previous visit (from the past 240 hour(s)).   Labs: BNP (last 3 results)  Recent Labs  01/23/16 1535 03/23/16 0343 07/19/16 1757  BNP 16.1 8.8 [id number]   Basic Metabolic Panel:  Recent Labs Lab 07/19/16 1755 07/20/16 0533 07/22/16 0521  NA 141 140 142  K 3.9 4.0 4.0  CL 106 102 103  CO2 28 28 31   GLUCOSE 79 221* 115*  BUN 14 16 22*  CREATININE 0.96 1.01* 0.88  CALCIUM 9.2 9.5 9.2   Liver Function Tests: No results for input(s): AST, ALT, ALKPHOS, BILITOT, PROT, ALBUMIN in the last 168 hours. No results for input(s): LIPASE, AMYLASE in the last 168 hours. No results for input(s): AMMONIA in the last 168 hours. CBC:  Recent Labs Lab 07/19/16 2055 07/20/16 0533  WBC 6.8 6.3  NEUTROABS 4.2  --   HGB 12.0 11.5*  HCT 34.4* 35.0*  MCV 78.9 80.6  PLT 243 238   Cardiac Enzymes: No results for input(s): CKTOTAL, CKMB, CKMBINDEX, TROPONINI in the last 168 hours. BNP: Invalid input(s): POCBNP CBG:  Recent Labs Lab 07/21/16 1152 07/21/16 1637 07/21/16 2220 07/22/16 0743 07/22/16 1152  GLUCAP 209* 225* 153* 92 231*   D-Dimer  Recent Labs  07/19/16 2055  DDIMER 0.47   Hgb A1c No  results for input(s): HGBA1C in the last 72 hours. Lipid Profile No results for input(s): CHOL, HDL, LDLCALC, TRIG, CHOLHDL, LDLDIRECT in the last 72 hours. Thyroid function studies No results for input(s): TSH, T4TOTAL, T3FREE, THYROIDAB in the last 72 hours.  Invalid input(s): FREET3 Anemia work up No results for input(s): VITAMINB12, FOLATE, FERRITIN, TIBC, IRON, RETICCTPCT in the last 72 hours.  Urinalysis    Component Value Date/Time   COLORURINE YELLOW 12/03/2015 0011   APPEARANCEUR CLEAR 12/03/2015 0011   LABSPEC 1.026 12/03/2015 0011   PHURINE 6.0 12/03/2015 0011   GLUCOSEU >1000 (A) 12/03/2015 0011   HGBUR NEGATIVE 12/03/2015 0011   BILIRUBINUR NEGATIVE 12/03/2015 0011   KETONESUR NEGATIVE 12/03/2015 0011   PROTEINUR NEGATIVE 12/03/2015 0011   UROBILINOGEN 0.2 03/31/2015 1231   NITRITE NEGATIVE 12/03/2015 0011   LEUKOCYTESUR NEGATIVE 12/03/2015 0011   Sepsis Labs Invalid input(s): PROCALCITONIN,  WBC,  LACTICIDVEN Microbiology No results found for this or any previous visit (from the past 240 hour(s)).   Time coordinating discharge: Over 30 minutes  SIGNED:   Debbe Odea, MD  [facility] 07/22/2016, 12:19 PM Pager   If 7PM-7AM, please contact night-coverage [email or web address] Password [email or web address]

## 2016-07-22 NOTE — Progress Notes (Addendum)
Chaplain visit the result of a Spiritual Consult (Mona) in Stephanie Schultz' EPIC Chart. Stephanie Milnor is stressed over the thought of being more dependent on machine assisted breathing. In a life review she admits her choice to begin smoking at an early age lead her to her current medical condition. Our conversations centered on lower her stress and spiritual pain while faithfully adhering to medical protocols prescribed to her. She asked for and received a blessing and a prayer.  Leming marked completed in EPIC.  Page the chaplain should Stephanie Franceschini need or request further spiritual care.  Sallee Lange. Jina Olenick, DMin, MDiv Chaplain

## 2016-07-27 ENCOUNTER — Ambulatory Visit: Payer: Medicaid Other | Admitting: Podiatry

## 2016-08-02 ENCOUNTER — Ambulatory Visit: Payer: Medicaid Other | Admitting: *Deleted

## 2016-08-02 ENCOUNTER — Encounter: Payer: Medicaid Other | Attending: Internal Medicine | Admitting: Dietician

## 2016-08-02 ENCOUNTER — Encounter: Payer: Self-pay | Admitting: Dietician

## 2016-08-02 DIAGNOSIS — E782 Mixed hyperlipidemia: Secondary | ICD-10-CM | POA: Diagnosis not present

## 2016-08-02 DIAGNOSIS — I5032 Chronic diastolic (congestive) heart failure: Secondary | ICD-10-CM | POA: Diagnosis not present

## 2016-08-02 DIAGNOSIS — K219 Gastro-esophageal reflux disease without esophagitis: Secondary | ICD-10-CM | POA: Insufficient documentation

## 2016-08-02 DIAGNOSIS — E118 Type 2 diabetes mellitus with unspecified complications: Secondary | ICD-10-CM

## 2016-08-02 DIAGNOSIS — Z713 Dietary counseling and surveillance: Secondary | ICD-10-CM | POA: Diagnosis present

## 2016-08-02 NOTE — Progress Notes (Signed)
Diabetes Self-Management Education  Visit Type: First/Initial  Appt. Start Time: 1030 Appt. End Time: 1200  08/02/2016  [name], identified by name and date of birth, is a 60 y.o. female with a diagnosis of Diabetes: Type 2. Other hx includes:  GERD, CHF, HTN and breast abnormalities being checked for cancer.  She is on home O2.  O2 sats during this appointment were 93-95.  Upon returning from the restroom it was 106 but it increased back to her normal level with rest.  Multiple aspects with her health requiring nutritional intervention.  Focused on low sodium and general diabetic guidelines at this time.  Recent hospitalization for respiratory failure and was placed on Prednisone which has increased her blood sugar.  She states that she does not drink enough water but does avoid added salt.  Patient to go to the grocery store after this appointment.  Assisted her in making a shopping list.  Patient lives alone.  She has an aide daily.  Lacks support for grocery shopping and states that she at times will go without balanced meals to not having food at her home.  She has two daughters in different towns which she is reluctant to ask for help.  She does light cooking.  She relies on [employer] for transportation.  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  Her sister died one month ago and she has increased grief and cried during this visit.  She did not want to reschedule despite not feeling well physically and mentally.  Medicationss for diabetes includes:  Actos, Januvia, Glucotrol.  Her blood sugars were better controlled prior to the steroids.    ASSESSMENT  Height 5' (1.524 m), weight 236 lb (107 kg), last menstrual period 06/04/2000. Body mass index is 46.09 kg/m.      Diabetes Self-Management Education - 08/02/16 1108      Visit Information   Visit Type First/Initial     Initial Visit   Diabetes Type Type 2   Are you currently following a meal  plan? No   Are you taking your medications as prescribed? Yes   Date Diagnosed unknown     Health Coping   How would you rate your overall health? Fair     Psychosocial Assessment   Patient Belief/Attitude about Diabetes Motivated to manage diabetes   Self-care barriers None   Self-management support Doctor's office;Family   Other persons present Patient   Patient Concerns Nutrition/Meal planning;Glycemic Control;Weight Control   Special Needs None   Preferred Learning Style No preference indicated   Learning Readiness Ready   How often do you need to have someone help you when you read instructions, pamphlets, or other written materials from your doctor or pharmacy? 1 - Never   What is the last grade level you completed in school? working on GED currently     Pre-Education Assessment   Patient understands the diabetes disease and treatment process. Needs Review   Patient understands incorporating nutritional management into lifestyle. Needs Review   Patient undertands incorporating physical activity into lifestyle. Needs Review   Patient understands using medications safely. Demonstrates understanding / competency   Patient understands monitoring blood glucose, interpreting and using results Needs Review   Patient understands prevention, detection, and treatment of acute complications. Needs Review   Patient understands prevention, detection, and treatment of chronic complications. Demonstrates understanding / competency   Patient understands how to develop strategies to address psychosocial issues.  Needs Review   Patient understands how to develop strategies to promote health/change behavior. Needs Review     Complications   Last HgB A1C per patient/outside source 7.8 %  per patient 7.8% and thinks that this was in July 2017   How often do you check your blood sugar? 3-4 times/day   Fasting Blood glucose range (mg/dL) 70-129;>200  since hospitalization and steroids it is running  over 200   Postprandial Blood glucose range (mg/dL) >200   Number of hypoglycemic episodes per month 1   Can you tell when your blood sugar is low? Yes   What do you do if your blood sugar is low? drinks OJ or eats hard candy   Number of hyperglycemic episodes per week 21   Can you tell when your blood sugar is high? Yes   What do you do if your blood sugar is high? drink water   Have you had a dilated eye exam in the past 12 months? Yes   Have you had a dental exam in the past 12 months? Yes   Are you checking your feet? Yes   How many days per week are you checking your feet? 4     Dietary Intake   Breakfast plain oatmeal, raisins, boiled egg, 1/2 cup coffee with creamer and sugar   Snack (morning) salad   Lunch [place] sandwich, mayo, chips, fruit   Snack (afternoon) salad   Dinner sometimes skips OR pintos and cornbread OR chicken noodle soup  5:30-6   Snack (evening) none   Beverage(s) water, coffee, gingerale     Exercise   Exercise Type ADL's   How many days per week to you exercise? 0   How many minutes per day do you exercise? 0   Total minutes per week of exercise 0     Patient Education   Previous Diabetes Education No   Nutrition management  Role of diet in the treatment of diabetes and the relationship between the three main macronutrients and blood glucose level;Meal options for control of blood glucose level and chronic complications.   Monitoring Yearly dilated eye exam;Daily foot exams   Psychosocial adjustment Worked with patient to identify barriers to care and solutions;Identified and addressed patients feelings and concerns about diabetes   Personal strategies to promote health Lifestyle issues that need to be addressed for better diabetes care     Individualized Goals (developed by patient)   Nutrition General guidelines for healthy choices and portions discussed   Physical Activity Not Applicable   Medications take my medication as prescribed   Monitoring   test my blood glucose as discussed   Problem Solving who can assist with grocery shopping   Reducing Risk examine blood glucose patterns;do foot checks daily;treat hypoglycemia with 15 grams of carbs if blood glucose less than 70mg /dL   Health Coping ask for help with (comment);discuss diabetes with (comment)  grocery shopping     Post-Education Assessment   Patient understands the diabetes disease and treatment process. Needs Review   Patient understands incorporating nutritional management into lifestyle. Needs Review   Patient undertands incorporating physical activity into lifestyle. --  NA   Patient understands using medications safely. Demonstrates understanding / competency   Patient understands monitoring blood glucose, interpreting and using results Needs Review   Patient understands prevention, detection, and treatment of acute complications. Demonstrates understanding / competency   Patient understands prevention, detection, and treatment of chronic complications. Needs Review   Patient understands how to  develop strategies to address psychosocial issues. Needs Review   Patient understands how to develop strategies to promote health/change behavior. Needs Review     Outcomes   Expected Outcomes --  multiple issues, demonstrated interest, patient able to make small changes at a time.   Future DMSE 4-6 wks   Program Status Not Completed      Individualized Plan for Diabetes Self-Management Training:   Learning Objective:  Patient will have a greater understanding of diabetes self-management. Patient education plan is to attend individual and/or group sessions per assessed needs and concerns.   Plan:   Patient Instructions  Choose water or diet gingerale. Be sure to drink enough fluid Consider asking your daughters with help grocery shopping once per month. Have a small amount of protein with each meal (meat, egg, beans, milk) 1/2 your plate should be non starchy  vegetables 1/4 of your plate should be carbohydrate  You can have 1 small serving of fruit with each meal Continue to avoid added salt. Choose low sodium soup and no added salt vegetables.   Expected Outcomes:   (multiple issues, demonstrated interest, patient able to make small changes at a time.)  Education material provided: My Plate, will mail information on low sodium diet and guidelines for GERD.  Patient to follow up for further diabetes education.  If problems or questions, patient to contact team via:  Phone  Future DSME appointment: 4-6 wks

## 2016-08-02 NOTE — Patient Instructions (Signed)
Choose water or diet gingerale. Be sure to drink enough fluid Consider asking your daughters with help grocery shopping once per month. Have a small amount of protein with each meal (meat, egg, beans, milk) 1/2 your plate should be non starchy vegetables 1/4 of your plate should be carbohydrate  You can have 1 small serving of fruit with each meal Continue to avoid added salt. Choose low sodium soup and no added salt vegetables.

## 2016-08-07 ENCOUNTER — Ambulatory Visit
Admission: RE | Admit: 2016-08-07 | Discharge: 2016-08-07 | Disposition: A | Discharge status: Home / Self Care | Payer: Medicaid Other | Source: Ambulatory Visit | Attending: Internal Medicine | Admitting: Internal Medicine

## 2016-08-07 DIAGNOSIS — N6310 Unspecified lump in the right breast, unspecified quadrant: Secondary | ICD-10-CM

## 2016-08-10 ENCOUNTER — Ambulatory Visit (INDEPENDENT_AMBULATORY_CARE_PROVIDER_SITE_OTHER): Payer: Medicaid Other | Admitting: Cardiology

## 2016-08-10 ENCOUNTER — Encounter (HOSPITAL_COMMUNITY): Payer: Self-pay

## 2016-08-10 ENCOUNTER — Encounter: Payer: Self-pay | Admitting: Cardiology

## 2016-08-10 ENCOUNTER — Emergency Department (HOSPITAL_COMMUNITY): Payer: Medicaid Other

## 2016-08-10 ENCOUNTER — Inpatient Hospital Stay (HOSPITAL_COMMUNITY)
Admission: EM | Admit: 2016-08-10 | Discharge: 2016-08-16 | DRG: 190 | Disposition: A | Discharge status: Home Health | Payer: Medicaid Other | Attending: Family Medicine | Admitting: Family Medicine

## 2016-08-10 VITALS — BP 130/70 | HR 100 | Ht 60.0 in | Wt 234.4 lb

## 2016-08-10 DIAGNOSIS — Z8541 Personal history of malignant neoplasm of cervix uteri: Secondary | ICD-10-CM

## 2016-08-10 DIAGNOSIS — I5032 Chronic diastolic (congestive) heart failure: Secondary | ICD-10-CM | POA: Diagnosis not present

## 2016-08-10 DIAGNOSIS — I519 Heart disease, unspecified: Secondary | ICD-10-CM | POA: Diagnosis not present

## 2016-08-10 DIAGNOSIS — E785 Hyperlipidemia, unspecified: Secondary | ICD-10-CM | POA: Diagnosis present

## 2016-08-10 DIAGNOSIS — Z7984 Long term (current) use of oral hypoglycemic drugs: Secondary | ICD-10-CM | POA: Diagnosis not present

## 2016-08-10 DIAGNOSIS — Z720 Tobacco use: Secondary | ICD-10-CM | POA: Diagnosis present

## 2016-08-10 DIAGNOSIS — K219 Gastro-esophageal reflux disease without esophagitis: Secondary | ICD-10-CM | POA: Diagnosis present

## 2016-08-10 DIAGNOSIS — R0602 Shortness of breath: Secondary | ICD-10-CM | POA: Diagnosis present

## 2016-08-10 DIAGNOSIS — R079 Chest pain, unspecified: Secondary | ICD-10-CM | POA: Diagnosis not present

## 2016-08-10 DIAGNOSIS — I5189 Other ill-defined heart diseases: Secondary | ICD-10-CM | POA: Diagnosis present

## 2016-08-10 DIAGNOSIS — Z9114 Patient's other noncompliance with medication regimen: Secondary | ICD-10-CM | POA: Diagnosis not present

## 2016-08-10 DIAGNOSIS — Z9981 Dependence on supplemental oxygen: Secondary | ICD-10-CM

## 2016-08-10 DIAGNOSIS — J441 Chronic obstructive pulmonary disease with (acute) exacerbation: Secondary | ICD-10-CM | POA: Diagnosis present

## 2016-08-10 DIAGNOSIS — J9620 Acute and chronic respiratory failure, unspecified whether with hypoxia or hypercapnia: Secondary | ICD-10-CM | POA: Diagnosis present

## 2016-08-10 DIAGNOSIS — G4733 Obstructive sleep apnea (adult) (pediatric): Secondary | ICD-10-CM | POA: Diagnosis present

## 2016-08-10 DIAGNOSIS — J449 Chronic obstructive pulmonary disease, unspecified: Secondary | ICD-10-CM | POA: Diagnosis present

## 2016-08-10 DIAGNOSIS — I10 Essential (primary) hypertension: Secondary | ICD-10-CM | POA: Diagnosis present

## 2016-08-10 DIAGNOSIS — J9621 Acute and chronic respiratory failure with hypoxia: Secondary | ICD-10-CM | POA: Diagnosis not present

## 2016-08-10 DIAGNOSIS — M109 Gout, unspecified: Secondary | ICD-10-CM | POA: Diagnosis present

## 2016-08-10 DIAGNOSIS — Z87891 Personal history of nicotine dependence: Secondary | ICD-10-CM | POA: Diagnosis not present

## 2016-08-10 DIAGNOSIS — F419 Anxiety disorder, unspecified: Secondary | ICD-10-CM | POA: Diagnosis present

## 2016-08-10 DIAGNOSIS — E1140 Type 2 diabetes mellitus with diabetic neuropathy, unspecified: Secondary | ICD-10-CM | POA: Diagnosis present

## 2016-08-10 DIAGNOSIS — Z7951 Long term (current) use of inhaled steroids: Secondary | ICD-10-CM

## 2016-08-10 DIAGNOSIS — Z79899 Other long term (current) drug therapy: Secondary | ICD-10-CM

## 2016-08-10 DIAGNOSIS — E119 Type 2 diabetes mellitus without complications: Secondary | ICD-10-CM | POA: Diagnosis not present

## 2016-08-10 DIAGNOSIS — Z6839 Body mass index (BMI) 39.0-39.9, adult: Secondary | ICD-10-CM | POA: Diagnosis not present

## 2016-08-10 HISTORY — DX: Chronic diastolic (congestive) heart failure: I50.32

## 2016-08-10 HISTORY — DX: Dependence on supplemental oxygen: Z99.81

## 2016-08-10 LAB — CBC WITH DIFFERENTIAL/PLATELET
Basophils Absolute: 0 10*3/uL (ref 0.0–0.1)
Basophils Relative: 1 %
EOS PCT: 2 %
Eosinophils Absolute: 0.1 10*3/uL (ref 0.0–0.7)
HEMATOCRIT: 34.4 % — AB (ref 36.0–46.0)
HEMOGLOBIN: 11.4 g/dL — AB (ref 12.0–15.0)
LYMPHS ABS: 1.5 10*3/uL (ref 0.7–4.0)
LYMPHS PCT: 25 %
MCH: 26.6 pg (ref 26.0–34.0)
MCHC: 33.1 g/dL (ref 30.0–36.0)
MCV: 80.4 fL (ref 78.0–100.0)
Monocytes Absolute: 0.3 10*3/uL (ref 0.1–1.0)
Monocytes Relative: 5 %
NEUTROS PCT: 67 %
Neutro Abs: 4 10*3/uL (ref 1.7–7.7)
Platelets: 212 10*3/uL (ref 150–400)
RBC: 4.28 MIL/uL (ref 3.87–5.11)
RDW: 15.6 % — AB (ref 11.5–15.5)
WBC: 5.9 10*3/uL (ref 4.0–10.5)

## 2016-08-10 LAB — BRAIN NATRIURETIC PEPTIDE: B Natriuretic Peptide: 3.8 pg/mL (ref 0.0–100.0)

## 2016-08-10 LAB — I-STAT TROPONIN, ED: TROPONIN I, POC: 0.01 ng/mL (ref 0.00–0.08)

## 2016-08-10 LAB — GLUCOSE, CAPILLARY
Glucose-Capillary: 390 mg/dL — ABNORMAL HIGH (ref 65–99)
Glucose-Capillary: 359 mg/dL — ABNORMAL HIGH (ref 65–99)

## 2016-08-10 LAB — BASIC METABOLIC PANEL
ANION GAP: 8 (ref 5–15)
BUN: 15 mg/dL (ref 6–20)
CALCIUM: 9.6 mg/dL (ref 8.9–10.3)
CO2: 25 mmol/L (ref 22–32)
Chloride: 104 mmol/L (ref 101–111)
Creatinine, Ser: 1.02 mg/dL — ABNORMAL HIGH (ref 0.44–1.00)
GFR calc non Af Amer: 60 mL/min — ABNORMAL LOW (ref >60)
Glucose, Bld: 242 mg/dL — ABNORMAL HIGH (ref 65–99)
Potassium: 3.7 mmol/L (ref 3.5–5.1)
Sodium: 137 mmol/L (ref 135–145)

## 2016-08-10 MED ORDER — FLUTICASONE PROPIONATE 50 MCG/ACT NA SUSP
1.0000 | Freq: Every day | NASAL | Status: DC
  Administered 2016-08-11 – 2016-08-16 (×5): 1 via NASAL
  Filled 2016-08-10: qty 16

## 2016-08-10 MED ORDER — ONDANSETRON HCL 4 MG PO TABS: 4.0000 mg | ORAL_TABLET | Freq: Four times a day (QID) | ORAL | Status: DC | PRN

## 2016-08-10 MED ORDER — HYDROCODONE-ACETAMINOPHEN 7.5-325 MG PO TABS
1.0000 | ORAL_TABLET | Freq: Four times a day (QID) | ORAL | Status: DC | PRN
  Administered 2016-08-10 – 2016-08-14 (×4): 1 via ORAL
  Filled 2016-08-10 (×5): qty 1

## 2016-08-10 MED ORDER — SALINE SPRAY 0.65 % NA SOLN
1.0000 | NASAL | Status: DC | PRN
  Administered 2016-08-14: 1 via NASAL
  Filled 2016-08-10: qty 44

## 2016-08-10 MED ORDER — LEVOFLOXACIN IN D5W 500 MG/100ML IV SOLN
500.0000 mg | INTRAVENOUS | Status: DC
  Administered 2016-08-10 – 2016-08-15 (×6): 500 mg via INTRAVENOUS
  Filled 2016-08-10 (×6): qty 100

## 2016-08-10 MED ORDER — ZOLPIDEM TARTRATE 5 MG PO TABS: 10.0000 mg | ORAL_TABLET | Freq: Every day | ORAL | Status: DC

## 2016-08-10 MED ORDER — METHYLPREDNISOLONE SODIUM SUCC 125 MG IJ SOLR
125.0000 mg | Freq: Once | INTRAMUSCULAR | Status: AC
  Administered 2016-08-10: 125 mg via INTRAVENOUS
  Filled 2016-08-10: qty 2

## 2016-08-10 MED ORDER — FUROSEMIDE 20 MG PO TABS
20.0000 mg | ORAL_TABLET | Freq: Every day | ORAL | Status: DC
  Administered 2016-08-11 – 2016-08-16 (×6): 20 mg via ORAL
  Filled 2016-08-10 (×7): qty 1

## 2016-08-10 MED ORDER — ALBUTEROL SULFATE (2.5 MG/3ML) 0.083% IN NEBU
2.5000 mg | INHALATION_SOLUTION | RESPIRATORY_TRACT | Status: DC | PRN
  Administered 2016-08-15: 2.5 mg via RESPIRATORY_TRACT
  Filled 2016-08-10: qty 3

## 2016-08-10 MED ORDER — ALLOPURINOL 100 MG PO TABS
100.0000 mg | ORAL_TABLET | Freq: Every day | ORAL | Status: DC
  Administered 2016-08-10 – 2016-08-16 (×7): 100 mg via ORAL
  Filled 2016-08-10 (×7): qty 1

## 2016-08-10 MED ORDER — SODIUM CHLORIDE 0.9% FLUSH
3.0000 mL | Freq: Two times a day (BID) | INTRAVENOUS | Status: DC
  Administered 2016-08-10 – 2016-08-16 (×9): 3 mL via INTRAVENOUS

## 2016-08-10 MED ORDER — LINACLOTIDE 145 MCG PO CAPS
145.0000 ug | ORAL_CAPSULE | Freq: Every day | ORAL | Status: DC
  Administered 2016-08-11 – 2016-08-13 (×3): 145 ug via ORAL
  Filled 2016-08-10 (×4): qty 1

## 2016-08-10 MED ORDER — PREGABALIN 75 MG PO CAPS
75.0000 mg | ORAL_CAPSULE | Freq: Two times a day (BID) | ORAL | Status: DC
  Administered 2016-08-10 – 2016-08-16 (×12): 75 mg via ORAL
  Filled 2016-08-10 (×13): qty 1

## 2016-08-10 MED ORDER — GUAIFENESIN ER 600 MG PO TB12
600.0000 mg | ORAL_TABLET | Freq: Two times a day (BID) | ORAL | Status: DC
  Administered 2016-08-10 – 2016-08-16 (×13): 600 mg via ORAL
  Filled 2016-08-10 (×13): qty 1

## 2016-08-10 MED ORDER — PANTOPRAZOLE SODIUM 40 MG PO TBEC
40.0000 mg | DELAYED_RELEASE_TABLET | Freq: Every day | ORAL | Status: DC
  Administered 2016-08-10: 40 mg via ORAL
  Filled 2016-08-10: qty 1

## 2016-08-10 MED ORDER — SENNOSIDES-DOCUSATE SODIUM 8.6-50 MG PO TABS: 1.0000 | ORAL_TABLET | Freq: Every evening | ORAL | Status: DC | PRN

## 2016-08-10 MED ORDER — ENOXAPARIN SODIUM 40 MG/0.4ML ~~LOC~~ SOLN
40.0000 mg | SUBCUTANEOUS | Status: DC
  Administered 2016-08-10 – 2016-08-15 (×6): 40 mg via SUBCUTANEOUS
  Filled 2016-08-10 (×6): qty 0.4

## 2016-08-10 MED ORDER — AMLODIPINE BESYLATE 2.5 MG PO TABS
2.5000 mg | ORAL_TABLET | Freq: Every day | ORAL | Status: DC
  Administered 2016-08-11 – 2016-08-16 (×6): 2.5 mg via ORAL
  Filled 2016-08-10 (×5): qty 1

## 2016-08-10 MED ORDER — INSULIN ASPART 100 UNIT/ML ~~LOC~~ SOLN
0.0000 [IU] | Freq: Three times a day (TID) | SUBCUTANEOUS | Status: DC
  Administered 2016-08-10 – 2016-08-11 (×2): 15 [IU] via SUBCUTANEOUS

## 2016-08-10 MED ORDER — ALBUTEROL SULFATE (2.5 MG/3ML) 0.083% IN NEBU
2.5000 mg | INHALATION_SOLUTION | Freq: Four times a day (QID) | RESPIRATORY_TRACT | Status: DC
  Administered 2016-08-11 – 2016-08-12 (×8): 2.5 mg via RESPIRATORY_TRACT
  Filled 2016-08-10 (×7): qty 3

## 2016-08-10 MED ORDER — INFLUENZA VAC SPLIT QUAD 0.5 ML IM SUSY: 0.5000 mL | PREFILLED_SYRINGE | INTRAMUSCULAR | Status: DC

## 2016-08-10 MED ORDER — INSULIN ASPART 100 UNIT/ML ~~LOC~~ SOLN
0.0000 [IU] | Freq: Every day | SUBCUTANEOUS | Status: DC
  Administered 2016-08-10: 5 [IU] via SUBCUTANEOUS
  Administered 2016-08-11: 3 [IU] via SUBCUTANEOUS
  Administered 2016-08-12 – 2016-08-13 (×2): 4 [IU] via SUBCUTANEOUS
  Administered 2016-08-14: 3 [IU] via SUBCUTANEOUS
  Administered 2016-08-15: 2 [IU] via SUBCUTANEOUS

## 2016-08-10 MED ORDER — IPRATROPIUM-ALBUTEROL 0.5-2.5 (3) MG/3ML IN SOLN
3.0000 mL | Freq: Once | RESPIRATORY_TRACT | Status: AC
  Administered 2016-08-10: 3 mL via RESPIRATORY_TRACT
  Filled 2016-08-10: qty 3

## 2016-08-10 MED ORDER — NYSTATIN 100000 UNIT/GM EX CREA: 1.0000 "application " | TOPICAL_CREAM | Freq: Two times a day (BID) | CUTANEOUS | Status: DC | PRN

## 2016-08-10 MED ORDER — SODIUM CHLORIDE 0.9% FLUSH
3.0000 mL | Freq: Two times a day (BID) | INTRAVENOUS | Status: DC
  Administered 2016-08-10 – 2016-08-16 (×8): 3 mL via INTRAVENOUS

## 2016-08-10 MED ORDER — SODIUM CHLORIDE 0.9% FLUSH: 3.0000 mL | INTRAVENOUS | Status: DC | PRN

## 2016-08-10 MED ORDER — ZOLPIDEM TARTRATE 5 MG PO TABS
5.0000 mg | ORAL_TABLET | Freq: Every day | ORAL | Status: DC
Start: 2016-08-10 — End: 2016-08-16
  Administered 2016-08-10 – 2016-08-15 (×6): 5 mg via ORAL
  Filled 2016-08-10 (×6): qty 1

## 2016-08-10 MED ORDER — OLOPATADINE HCL 0.7 % OP SOLN: 1.0000 [drp] | Freq: Every day | OPHTHALMIC | Status: DC | PRN

## 2016-08-10 MED ORDER — PANTOPRAZOLE SODIUM 40 MG PO TBEC
40.0000 mg | DELAYED_RELEASE_TABLET | Freq: Every day | ORAL | Status: DC
  Administered 2016-08-11 – 2016-08-16 (×6): 40 mg via ORAL
  Filled 2016-08-10 (×6): qty 1

## 2016-08-10 MED ORDER — TIOTROPIUM BROMIDE MONOHYDRATE 18 MCG IN CAPS
18.0000 ug | ORAL_CAPSULE | Freq: Every day | RESPIRATORY_TRACT | Status: DC
  Administered 2016-08-11 – 2016-08-16 (×6): 18 ug via RESPIRATORY_TRACT
  Filled 2016-08-10 (×2): qty 5

## 2016-08-10 MED ORDER — ALPRAZOLAM 0.5 MG PO TABS
1.0000 mg | ORAL_TABLET | Freq: Two times a day (BID) | ORAL | Status: DC | PRN
  Administered 2016-08-10 – 2016-08-15 (×8): 1 mg via ORAL
  Filled 2016-08-10 (×3): qty 2
  Filled 2016-08-10: qty 4
  Filled 2016-08-10 (×5): qty 2

## 2016-08-10 MED ORDER — SODIUM CHLORIDE 0.9 % IV SOLN: 250.0000 mL | INTRAVENOUS | Status: DC | PRN

## 2016-08-10 MED ORDER — PRAVASTATIN SODIUM 20 MG PO TABS
20.0000 mg | ORAL_TABLET | Freq: Every day | ORAL | Status: DC
  Administered 2016-08-10 – 2016-08-16 (×7): 20 mg via ORAL
  Filled 2016-08-10 (×7): qty 1

## 2016-08-10 MED ORDER — METHYLPREDNISOLONE SODIUM SUCC 125 MG IJ SOLR
60.0000 mg | Freq: Four times a day (QID) | INTRAMUSCULAR | Status: AC
  Administered 2016-08-10 – 2016-08-11 (×3): 60 mg via INTRAVENOUS
  Filled 2016-08-10 (×3): qty 2

## 2016-08-10 MED ORDER — ALBUTEROL SULFATE (2.5 MG/3ML) 0.083% IN NEBU
2.5000 mg | INHALATION_SOLUTION | RESPIRATORY_TRACT | Status: DC
  Administered 2016-08-10 (×2): 2.5 mg via RESPIRATORY_TRACT
  Filled 2016-08-10 (×2): qty 3

## 2016-08-10 NOTE — Patient Instructions (Signed)
Medication Instructions:  Your physician recommends that you continue on your current medications as directed. Please refer to the Current Medication list given to you today.   Labwork: None   Testing/Procedures: None   Follow-Up: Your physician wants you to follow-up in: 6 months with Dr Radford Pax. (March 2018). You will receive a reminder letter in the mail two months in advance. If you don't receive a letter, please call our office to schedule the follow-up appointment.   Any Other Special Instructions Will Be Listed Below (If Applicable).  You are being transported by EMS to [facility] ED for further evaluation of your COPD.    If you need a refill on your cardiac medications before your next appointment, please call your pharmacy.

## 2016-08-10 NOTE — ED Notes (Signed)
Attempted to call report

## 2016-08-10 NOTE — Progress Notes (Signed)
1800 report received from ED RN.Marland Kitchen EPIC reviewed

## 2016-08-10 NOTE — Progress Notes (Signed)
1827 arrived from ED via stretcher able to transfer self  To bed . Pt able to speak in a complete sentence . But marked sob . Conversant placed back to bed . Kept comfortable in bed

## 2016-08-10 NOTE — H&P (Signed)
History and Physical    Stephanie Schultz [id number] DOB: Dec 26, 1955 DOA: 08/10/2016  PCP: Barbette Merino, MD Patient coming from: home  Chief Complaint: sob  HPI: Stephanie Schultz is a pleasant 60 y.o. female with medical history significant of  COPD on home oxygen, diastolic CHF, diabetes, hypertension, chronic dyspnea with exertion presents to the emergency department from Dr. Theodosia Blender office chief complaint of worsening dyspnea on exertion. Initial evaluation reveals acute on chronic respiratory failure likely related to COPD exacerbation  Information is obtained from the patient. She states she is better usual state of health for the last 2 weeks at which time she was discharged from the hospital for this same ~last week when she gradually became more short of breath. Associated symptoms include increased cough with increased sputum production. She describes the sputum is thick white mucus. She denies any headache fever chills. She reports she turned her oxygen up to 4 L little relief. She denies any lower extremity edema but states her abdomen "is always tight". He also complains of some chest "soreness" worse when she coughs. She denies abdominal pain nausea vomiting diarrhea dysuria hematuria frequency or urgency. She denies headache visual disturbances numbness tingling of her extremities. She denies lightheadedness syncope or near-syncope.    ED Course: In the emergency department oxygen saturation level 83% when she was given on the bedpan with 3 L of oxygen she's afebrile hemodynamically stable. She is provided with nebulizers and Solu-Medrol  Review of Systems: As per HPI otherwise 10 point review of systems negative.   Ambulatory Status: Independently with no recent falls  Past Medical History:  Diagnosis Date  . Anemia    chronis  . Anxiety   . Arthritis   . Cancer ([place]) 1990   cervical   . Chronic diastolic CHF (congestive heart failure) ([place]) 08/10/2016  . COPD (chronic  obstructive pulmonary disease) (H[facility])    Pulmo: [name]  . Diabetes mellitus without complication ([place])    a. A1c 8.3 in 11/2015  . Emphysema   . Gallstones    s/p cholecystectomy  . GERD (gastroesophageal reflux disease)   . Gout   . Hx of cardiac catheterization    a. LHC at [facility] in [place], [place] 09/2008:  Normal coronary arteries EF 70%.  . Hyperlipidemia   . Hypertension   . Morbid obesity ([place])   . OSA on CPAP    CPAP at night   . Pneumonia   . Sickle cell trait ([place])   . Supplemental oxygen dependent    2L CONTINUOUSLEY  . Tobacco abuse    a. up to 3ppd from age 44 to 43, now 1/4 ppd (01/2013) >> Quit 10/2015    Past Surgical History:  Procedure Laterality Date  . CARDIAC CATHETERIZATION    . CHOLECYSTECTOMY N/A 12/13/2015   Procedure: LAPAROSCOPIC CHOLECYSTECTOMY;  Surgeon: Ralene Ok, MD;  Location: [facility];  Service: General;  Laterality: N/A;  . COLONOSCOPY  09/05/2012   Procedure: COLONOSCOPY;  Surgeon: Beryle Beams, MD;  Location: [facility];  Service: Endoscopy;  Laterality: N/A;  . COLONOSCOPY WITH PROPOFOL N/A 09/02/2015   Procedure: COLONOSCOPY WITH PROPOFOL;  Surgeon: Carol Ada, MD;  Location: [facility] ENDOSCOPY;  Service: Endoscopy;  Laterality: N/A;  . TUBAL LIGATION      Social History   Social History  . Marital status: Single    Spouse name: N/A  . Number of children: 3  . Years of education: 11th   Occupational History  . Disabled  Emphysema   Social History Main Topics  . Smoking status: Former Smoker    Packs/day: 0.50    Years: 36.00    Types: Cigarettes    Quit date: 11/11/2015  . Smokeless tobacco: Never Used     Comment: Approx 90 pk-yrs (up to 3ppd until ~ 2009). Smoking 3 cigs per day now.  . Alcohol use No  . Drug use: No  . Sexual activity: Yes   Other Topics Concern  . Not on file   Social History Narrative   From [place], [place].  Moved to [place] about 8 years ago to be closer to her daughter. She moved back to  [place] about a year and a half ago. She lives by herself. She does not routinely exercise or adhere to any particular diet.   Caffeine Use: 1-2 cups daily   Disabled; Previously in home care    Allergies  Allergen Reactions  . Acetaminophen Other (See Comments)    Pt was told that she could not take Tylenol, does not know why    Family History  Problem Relation Age of Onset  . Other Father     unaware of father's medical history  . Diabetes Mother     alive @ 57  . Myasthenia gravis Mother   . Lung cancer Paternal Aunt   . Lung cancer Paternal Grandfather   . Other      multiple siblings a&w.  . Heart attack Neg Hx   . Heart failure Neg Hx     Prior to Admission medications   Medication Sig Start Date End Date Taking? Authorizing Provider  albuterol (PROVENTIL) (2.5 MG/3ML) 0.083% nebulizer solution Take 2.5 mg by nebulization 4 (four) times daily.    Yes Historical Provider, MD  allopurinol (ZYLOPRIM) 100 MG tablet Take 100 mg by mouth daily.   Yes Historical Provider, MD  ALPRAZolam Duanne Moron) 1 MG tablet Take 1 mg by mouth 2 (two) times daily as needed for anxiety.  04/13/16  Yes Historical Provider, MD  AMITIZA 24 MCG capsule Take 1 capsule by mouth daily. 07/13/16  Yes Historical Provider, MD  amLODipine (NORVASC) 2.5 MG tablet Take 1 tablet (2.5 mg total) by mouth daily. 04/26/16  Yes Scott Joylene Draft, PA-C  budesonide-formoterol (SYMBICORT) 160-4.5 MCG/ACT inhaler Inhale 2 puffs into the lungs 2 (two) times daily. 05/03/15  Yes Collene Gobble, MD  fluticasone (FLONASE) 50 MCG/ACT nasal spray Place 1 spray into both nostrils daily. 03/24/16  Yes Belkys A Regalado, MD  furosemide (LASIX) 20 MG tablet Take 20 mg by mouth daily.   Yes Historical Provider, MD  glipiZIDE (GLUCOTROL) 10 MG tablet Take 10 mg by mouth 2 (two) times daily before a meal.    Yes Historical Provider, MD  guaiFENesin (MUCINEX) 600 MG 12 hr tablet Take 1 tablet (600 mg total) by mouth 2 (two) times daily.  07/22/16  Yes Debbe Odea, MD  HYDROcodone-acetaminophen (NORCO) 7.5-325 MG tablet Take 1 tablet by mouth every 6 (six) hours as needed for moderate pain.   Yes Historical Provider, MD  JANUVIA 100 MG tablet Take 100 mg by mouth daily. 05/24/16  Yes Historical Provider, MD  Linaclotide Rolan Lipa) 145 MCG CAPS capsule Take 145 mcg by mouth daily.    Yes Historical Provider, MD  loratadine (CLARITIN) 10 MG tablet Take 10 mg by mouth daily.   Yes Historical Provider, MD  nystatin cream (MYCOSTATIN) Apply 1 application topically 2 (two) times daily as needed (yeast infection.).  Yes Historical Provider, MD  Olopatadine HCl (PAZEO) 0.7 % SOLN Place 1 drop into both eyes daily as needed (allergies).    Yes Historical Provider, MD  ondansetron (ZOFRAN) 4 MG tablet Take 1 tablet (4 mg total) by mouth every 6 (six) hours as needed for nausea or vomiting. 05/30/16  Yes Bethany Molt, DO  OXYGEN Inhale 2.5 L into the lungs continuous.   Yes Historical Provider, MD  pantoprazole (PROTONIX) 40 MG tablet Take 1 tablet (40 mg total) by mouth daily at 12 noon. 07/22/16  Yes Debbe Odea, MD  pioglitazone (ACTOS) 30 MG tablet Take 30 mg by mouth daily. 12/29/15  Yes Historical Provider, MD  pravastatin (PRAVACHOL) 20 MG tablet Take 20 mg by mouth daily.   Yes Historical Provider, MD  pregabalin (LYRICA) 75 MG capsule Take 75 mg by mouth 2 (two) times daily.   Yes Historical Provider, MD  PROAIR HFA 108 (90 Base) MCG/ACT inhaler INHALE 2 PUFFS BY MOUTH EVERY 4 HOURS AS NEEDED FOR WHEEZING 06/14/16  Yes Collene Gobble, MD  sodium chloride (OCEAN) 0.65 % SOLN nasal spray Place 1 spray into both nostrils as needed for congestion.   Yes Historical Provider, MD  tiotropium (SPIRIVA) 18 MCG inhalation capsule Place 1 capsule (18 mcg total) into inhaler and inhale daily. 05/03/15  Yes Collene Gobble, MD  triamterene-hydrochlorothiazide (DYAZIDE) 37.5-25 MG per capsule Take 1 capsule by mouth daily.   Yes Historical Provider, MD    zolpidem (AMBIEN) 10 MG tablet Take 10 mg by mouth at bedtime.   Yes Historical Provider, MD    Physical Exam: Vitals:   08/10/16 1045 08/10/16 1045 08/10/16 1130 08/10/16 1139  BP:   (!) 106/31 125/68  Pulse: 89  85 95  Resp: 24  21 (!) 33  Temp:  98.1 F (36.7 C)    TempSrc:  Oral    SpO2: 97%  98% 97%  Weight:      Height:         General:  Appears Rightly anxious but not uncomfortable  Eyes:  PERRL, EOMI, normal lids, iris ENT:  grossly normal hearing, lips & tongue, mucous membranes of her mouth are moist and pink Neck:  no LAD, masses or thyromegaly Cardiovascular:  RRR, no m/r/g. No LE edema.  Respiratory:  Mild increased work of breathing at rest. Uses abdominal accessory muscles. Breath sounds are quite diminished throughout year no wheeze crackles rhonchi. Abdomen:  soft, ntnd, obese positive bowel sounds no guarding or rebounding Skin:  no rash or induration seen on limited exam Musculoskeletal:  grossly normal tone BUE/BLE, good ROM, no bony abnormality Psychiatric:  grossly normal mood and affect, speech fluent and appropriate, AOx3 Neurologic:  CN 2-12 grossly intact, moves all extremities in coordinated fashion, sensation intact  Labs on Admission: I have personally reviewed following labs and imaging studies  CBC:  Recent Labs Lab 08/10/16 1022  WBC 5.9  NEUTROABS 4.0  HGB 11.4*  HCT 34.4*  MCV 80.4  PLT 99991111   Basic Metabolic Panel:  Recent Labs Lab 08/10/16 1022  NA 137  K 3.7  CL 104  CO2 25  GLUCOSE 242*  BUN 15  CREATININE 1.02*  CALCIUM 9.6   GFR: Estimated Creatinine Clearance: 73.6 mL/min (by C-G formula based on SCr of 1.02 mg/dL (H)). Liver Function Tests: No results for input(s): AST, ALT, ALKPHOS, BILITOT, PROT, ALBUMIN in the last 168 hours. No results for input(s): LIPASE, AMYLASE in the last 168 hours. No results for  input(s): AMMONIA in the last 168 hours. Coagulation Profile: No results for input(s): INR, PROTIME in  the last 168 hours. Cardiac Enzymes: No results for input(s): CKTOTAL, CKMB, CKMBINDEX, TROPONINI in the last 168 hours. BNP (last 3 results) No results for input(s): PROBNP in the last 8760 hours. HbA1C: No results for input(s): HGBA1C in the last 72 hours. CBG: No results for input(s): GLUCAP in the last 168 hours. Lipid Profile: No results for input(s): CHOL, HDL, LDLCALC, TRIG, CHOLHDL, LDLDIRECT in the last 72 hours. Thyroid Function Tests: No results for input(s): TSH, T4TOTAL, FREET4, T3FREE, THYROIDAB in the last 72 hours. Anemia Panel: No results for input(s): VITAMINB12, FOLATE, FERRITIN, TIBC, IRON, RETICCTPCT in the last 72 hours. Urine analysis:    Component Value Date/Time   COLORURINE YELLOW 12/03/2015 0011   APPEARANCEUR CLEAR 12/03/2015 0011   LABSPEC 1.026 12/03/2015 0011   PHURINE 6.0 12/03/2015 0011   GLUCOSEU >1000 (A) 12/03/2015 0011   HGBUR NEGATIVE 12/03/2015 0011   BILIRUBINUR NEGATIVE 12/03/2015 0011   KETONESUR NEGATIVE 12/03/2015 0011   PROTEINUR NEGATIVE 12/03/2015 0011   UROBILINOGEN 0.2 03/31/2015 1231   NITRITE NEGATIVE 12/03/2015 0011   LEUKOCYTESUR NEGATIVE 12/03/2015 0011    Creatinine Clearance: Estimated Creatinine Clearance: 73.6 mL/min (by C-G formula based on SCr of 1.02 mg/dL (H)).  Sepsis Labs: @LABRCNTIP (procalcitonin:4,lacticidven:4) )No results found for this or any previous visit (from the past 240 hour(s)).   Radiological Exams on Admission: Dg Chest 2 View  Result Date: 08/10/2016 CLINICAL DATA:  Shortness of breath. EXAM: CHEST  2 VIEW COMPARISON:  07/19/2016. FINDINGS: Mediastinum hilar structures are normal. Cardiomegaly with normal pulmonary vascularity. Low lung volumes with mild bibasilar atelectasis and/or infiltrates. No pleural effusion or pneumothorax. Multiple left rib fractures are noted. These appear old. IMPRESSION: 1. Low lung volumes with mild bibasilar atelectasis and/or infiltrates. 2.  Stable mild  cardiomegaly.  No pulmonary venous congestion. Electronically Signed   By: Marcello Moores  Register   On: 08/10/2016 11:17    EKG: Independently reviewed. Sinus rhythm Baseline wander in lead(s) I II no significant ST/T abnormalities Interpretation limited secondary to artifact  Assessment/Plan Principal Problem:   Acute on chronic respiratory failure (HCC) Active Problems:   COPD (chronic obstructive pulmonary disease) (HCC)   Hypertension   Obstructive sleep apnea   GERD (gastroesophageal reflux disease)   Diastolic dysfunction   Tobacco abuse   Morbid obesity ([place])   DM type 2 (diabetes mellitus, type 2) (HCC)   Anxiety   COPD exacerbation ([place])   #1. Acute on chronic respiratory failure likely related to COPD exacerbation. Oxygen saturation level dropped 83% while getting on the bedpan. Increased oxygen requirement. Chest x-ray reveals low lung volumes with mild by basilar atelectasis and/or infiltrates. No pulmonary venous congestion. Patient was evaluated by cardiology in the office today who opined COPD exacerbation. -Admit to telemetry -Scheduled nebulizers -Solu-Medrol -Levaquin -Flutter valve -Oxygen supplementation -Wean oxygen as able to baseline of 2-1/2 L  #2. COPD exacerbation. See #1. Home oxygen level 2.5 L. Home medications include Symbicort, mucinex, pro-air, Spiriva. Oxygen saturation level 93% on 3 L at rest. Chest x-ray as noted above. She is provided with Solu-Medrol and nebulizers in the emergency department -Continue scheduled nebulizers -Continue Solu-Medrol -Provide flutter valve -Provide Levaquin -Continue oxygen supplementation  #3. Chronic diastolic dysfunction. Echo in February 2017 with an EF of [id number] grade 1 diastolic dysfunction. Appears compensated. BNP within the limits of normal. She is on Lasix at home but has not been taking it of  late she believes it causes abdominal pain. Other home medications include hydrochlorothiazide. She was evaluated by  cardiology in the office today. Office note indicates her weight is stable and cardiology opines her respiratory issues related to #2 versus volume overload and I agree. Note does indicate patient would most likely need a cath once her respiratory status is improved -Daily weights -Monitor intake and output -Continue home medications -cards consult confirmed with cardmaster  4. Hypertension. Fair control in the emergency department. Home medications include Lasix, M.D. or both of which she has stopped taking. Amlodipine -Continue amlodipine -Monitor  #5. Diabetes type 2. On oral agents at home. Serum glucose on admission 242 -We will hold oral agents for now -Will use sliding scale insulin for optimal control -Obtain a hemoglobin A1c  #6. Morbid obesity. BMI 39 -Nutritional consult   DVT prophylaxis: lovenox  Code Status: full  Family Communication: none present  Disposition Plan: home  Consults called: cardilogy per cardmaster Admission status: inpatient    Radene Gunning MD [facility]  If 7PM-7AM, please contact night-coverage [email or web address] Password [email or web address]  08/10/2016, 1:55 PM

## 2016-08-10 NOTE — Progress Notes (Signed)
Cardiology Office Note    Date:  08/10/2016   ID:  Aayra Trach, DOB 1956/05/05, MRN [id number]  PCP:  Barbette Merino, MD  Cardiologist:  Fransico Him, MD   Chief Complaint  Patient presents with  . Congestive Heart Failure  . Hypertension    History of Present Illness:  Stephanie Schultz is a 60 y.o. female with a hx of DM2, HTN, COPD on chronic O2, OSA on CPAP, prior tobacco abuse. LHC at [facility] in [place], [place] 09/2008: Normal coronary arteries EF 70%. Patient noted exertional chest and left arm squeezing while her O2 was off and was seen by cardiology. Echo 01/15/13: Mod LVH, EF 65-70%, normal wall motion. Last seen in 02/2013.    Admitted 5/11-5/13 with AECOPD.  She was tx with a combination of steroids, bronchodilators, antibiotics.  CEs remained negative.  BNP was normal.  CXR suggested vascular congestion.  She was seen back in the office by Richardson Dopp, PA with complaints of CP in May. Due to her body habitus it as felt that nuclear or echo stress testing would likely results in poor images with increased risk of false positive study.  She was started on low dose long acting nitrates.      She has chronic DOE.  She is NYHA 3.  She wears O2 chronically at 2.5 LPM. She was just discharged from [facility] 2 weeks ago after acute respiratory failure with COPD exacerbatoin treated with IV steroids, antbx and nebs.  Over the past week she has become more SOB with cough productive of large amounts of thick white mucous.  She denies any fever but has had chills.  She is much more SOB and has had to turn her O2 up to 3-4L [place] instead of her 2.5L.  She has not had any LE edema.  She is also complaining of midsternal chest pain that is worse with inspiration and started out as sharp but now is sore.  Her ribs are very sore from coughing.  She is SOB at baseline with SOB just talking.  She complains that her chest is now very tight.      Past Medical History:  Diagnosis Date  . Anemia    chronis  .  Anxiety   . Arthritis   . Cancer ([place]) 1990   cervical   . Chronic diastolic CHF (congestive heart failure) ([place]) 08/10/2016  . COPD (chronic obstructive pulmonary disease) (HCC)    Pulmo: [name]  . Diabetes mellitus without complication ([place])    a. A1c 8.3 in 11/2015  . Emphysema   . Gallstones    s/p cholecystectomy  . GERD (gastroesophageal reflux disease)   . Gout   . Hx of cardiac catheterization    a. LHC at [facility] in [place], [place] 09/2008:  Normal coronary arteries EF 70%.  . Hyperlipidemia   . Hypertension   . Morbid obesity ([place])   . OSA on CPAP    CPAP at night   . Pneumonia   . Sickle cell trait ([place])   . Supplemental oxygen dependent    2L CONTINUOUSLEY  . Tobacco abuse    a. up to 3ppd from age 49 to 39, now 1/4 ppd (01/2013) >> Quit 10/2015    Past Surgical History:  Procedure Laterality Date  . CARDIAC CATHETERIZATION    . CHOLECYSTECTOMY N/A 12/13/2015   Procedure: LAPAROSCOPIC CHOLECYSTECTOMY;  Surgeon: Ralene Ok, MD;  Location: [facility];  Service: General;  Laterality: N/A;  . COLONOSCOPY  09/05/2012  Procedure: COLONOSCOPY;  Surgeon: Beryle Beams, MD;  [facility];  Service: Endoscopy;  Laterality: N/A;  . COLONOSCOPY WITH PROPOFOL N/A 09/02/2015   Procedure: COLONOSCOPY WITH PROPOFOL;  Surgeon: Carol Ada, MD;  [facility];  Service: Endoscopy;  Laterality: N/A;  . TUBAL LIGATION      Current Medications: Outpatient Medications Prior to Visit  Medication Sig Dispense Refill  . albuterol (PROVENTIL) (2.5 MG/3ML) 0.083% nebulizer solution Take 2.5 mg by nebulization 4 (four) times daily.     Marland Kitchen allopurinol (ZYLOPRIM) 100 MG tablet Take 100 mg by mouth daily.    Marland Kitchen ALPRAZolam (XANAX) 1 MG tablet Take 1 mg by mouth 2 (two) times daily as needed for anxiety.   0  . amLODipine (NORVASC) 2.5 MG tablet Take 1 tablet (2.5 mg total) by mouth daily. 30 tablet 11  . azithromycin (ZITHROMAX) 250 MG tablet Take 1 tablet (250 mg total) by  mouth daily. 3 each 0  . budesonide-formoterol (SYMBICORT) 160-4.5 MCG/ACT inhaler Inhale 2 puffs into the lungs 2 (two) times daily. 1 Inhaler 6  . fluticasone (FLONASE) 50 MCG/ACT nasal spray Place 1 spray into both nostrils daily. 16 g 0  . glipiZIDE (GLUCOTROL) 10 MG tablet Take 10 mg by mouth 2 (two) times daily before a meal.     . guaiFENesin (MUCINEX) 600 MG 12 hr tablet Take 1 tablet (600 mg total) by mouth 2 (two) times daily. 30 tablet 0  . HYDROcodone-acetaminophen (NORCO) 7.5-325 MG tablet Take 1 tablet by mouth every 6 (six) hours as needed for moderate pain.    Marland Kitchen JANUVIA 100 MG tablet Take 100 mg by mouth daily.  0  . Linaclotide (LINZESS) 145 MCG CAPS capsule Take 145 mcg by mouth daily.     Marland Kitchen loratadine (CLARITIN) 10 MG tablet Take 10 mg by mouth daily.    Marland Kitchen nystatin cream (MYCOSTATIN) Apply 1 application topically 2 (two) times daily as needed (yeast infection.).     Marland Kitchen Olopatadine HCl (PAZEO) 0.7 % SOLN Place 1 drop into both eyes daily as needed (allergies).     . ondansetron (ZOFRAN) 4 MG tablet Take 1 tablet (4 mg total) by mouth every 6 (six) hours as needed for nausea or vomiting. 12 tablet 0  . OXYGEN Inhale 2.5 L into the lungs continuous.    . pantoprazole (PROTONIX) 40 MG tablet Take 1 tablet (40 mg total) by mouth daily at 12 noon.    . pioglitazone (ACTOS) 30 MG tablet Take 30 mg by mouth daily.  0  . pravastatin (PRAVACHOL) 20 MG tablet Take 20 mg by mouth daily.    . predniSONE (DELTASONE) 10 MG tablet Take 6 tablets (60 mg total) by mouth daily with breakfast. Taper by 10 mg daily until tablets are finished 21 tablet 0  . pregabalin (LYRICA) 75 MG capsule Take 75 mg by mouth 2 (two) times daily.    Marland Kitchen PROAIR HFA 108 (90 Base) MCG/ACT inhaler INHALE 2 PUFFS BY MOUTH EVERY 4 HOURS AS NEEDED FOR WHEEZING 8.5 g 2  . sodium chloride (OCEAN) 0.65 % SOLN nasal spray Place 1 spray into both nostrils as needed for congestion.    Marland Kitchen tiotropium (SPIRIVA) 18 MCG inhalation  capsule Place 1 capsule (18 mcg total) into inhaler and inhale daily. 30 capsule 6  . triamterene-hydrochlorothiazide (DYAZIDE) 37.5-25 MG per capsule Take 1 capsule by mouth daily.    Marland Kitchen zolpidem (AMBIEN) 10 MG tablet Take 10 mg by mouth at bedtime.    Marland Kitchen  AMITIZA 24 MCG capsule Take 1 capsule by mouth daily.  0  . furosemide (LASIX) 20 MG tablet Take 20 mg by mouth daily.     No facility-administered medications prior to visit.      Allergies:   Acetaminophen   Social History   Social History  . Marital status: Single    Spouse name: N/A  . Number of children: 3  . Years of education: 11th   Occupational History  . Disabled     Emphysema   Social History Main Topics  . Smoking status: Former Smoker    Packs/day: 0.50    Years: 36.00    Types: Cigarettes    Quit date: 11/11/2015  . Smokeless tobacco: Never Used     Comment: Approx 90 pk-yrs (up to 3ppd until ~ 2009). Smoking 3 cigs per day now.  . Alcohol use No  . Drug use: No  . Sexual activity: Yes   Other Topics Concern  . None   Social History Narrative   From [place], [place].  Moved to [place] about 8 years ago to be closer to her daughter. She moved back to [place] about a year and a half ago. She lives by herself. She does not routinely exercise or adhere to any particular diet.   Caffeine Use: 1-2 cups daily   Disabled; Previously in home care     Family History:  The patient's family history includes Diabetes in her mother; Lung cancer in her paternal aunt and paternal grandfather; Myasthenia gravis in her mother; Other in her father.   ROS:   Please see the history of present illness.    ROS All other systems reviewed and are negative.  No flowsheet data found.     PHYSICAL EXAM:   VS:  BP 130/70 (BP Location: Right Arm, Patient Position: Sitting, Cuff Size: Normal)   Pulse 100   Ht 5' (1.524 m)   Wt 234 lb 6.4 oz (106.3 kg)   LMP 06/04/2000 (LMP Unknown)   SpO2 94%   BMI 45.78 kg/m    GEN:  Well nourished, well developed, in moderate respiratory distress HEENT: normal  Neck: no JVD, carotid bruits, or masses Cardiac: RRR; no murmurs, rubs, or gallops,no edema.  Intact distal pulses bilaterally.  Respiratory:  Decreased BS throughout GI: soft, nontender, nondistended, + BS MS: no deformity or atrophy  Skin: warm and dry, no rash Neuro:  Alert and Oriented x 3, Strength and sensation are intact Psych: euthymic mood, full affect  Wt Readings from Last 3 Encounters:  08/10/16 234 lb 6.4 oz (106.3 kg)  08/02/16 236 lb (107 kg)  07/22/16 231 lb 8 oz (105 kg)      Studies/Labs Reviewed:   EKG:  EKG is not ordered today.    Recent Labs: 06/13/2016: ALT 46 07/19/2016: B Natriuretic Peptide 21.5 07/20/2016: Hemoglobin 11.5; Platelets 238 07/22/2016: BUN 22; Creatinine, Ser 0.88; Potassium 4.0; Sodium 142   Lipid Panel    Component Value Date/Time   CHOL 201 (H) 04/26/2013 2333   TRIG 160 (H) 04/26/2013 2333   HDL 81 04/26/2013 2333   CHOLHDL 2.5 04/26/2013 2333   VLDL 32 04/26/2013 2333   LDLCALC 88 04/26/2013 2333    Additional studies/ records that were reviewed today include:  none    ASSESSMENT:    1. Chronic diastolic CHF (congestive heart failure) ([place])   2. Essential hypertension   3. Chest pain, unspecified chest pain type   4. COPD exacerbation ([place])  PLAN:  In order of problems listed above:  1. Chronic diastolic CHF - her weight is very stable.  I think her main respiratory problem is more due to her underlying COPD.  I do not think she has any significant volume overload on exam today.   2. HTN - BP controlled on current meds.  She will continue on amloidipine/diuretics.  BMET earlier in the month was stable. 3. Chest pain - I suspect that this is related to her underlying COPD. The Imdur that she started gave her a severe HA and she stopped it.  I think at this point she probably should have a cath once her respiratory status is improved.  A  nuclear or echo stress test would carry a high risk of false positive result and her body habitus is not ideal for coronary CTA.   4. COPD exacerbation - her respiratory status had declined with increased productive cough, SOB requiring higher doses of O2 and inability to ambulated due to SOB.  I think she is decompensated and recommend transfer to [facility] ER for further evaluation and admission for COPD exacerbation.  Will ask Cardiology to follow in hospital and ultimately get LHC to evaluate for ongoing CP.      Medication Adjustments/Labs and Tests Ordered: Current medicines are reviewed at length with the patient today.  Concerns regarding medicines are outlined above.  Medication changes, Labs and Tests ordered today are listed in the Patient Instructions below.  There are no Patient Instructions on file for this visit.   Signed, Fransico Him, MD  08/10/2016 9:21 AM    [address], [place], [place]  [address] Phone: [phone]; Fax: [phone]

## 2016-08-10 NOTE — ED Provider Notes (Signed)
[facility] DEPT Provider Note   CSN: [id number] Arrival date & time: 08/10/16  1000     History   Chief Complaint Chief Complaint  Patient presents with  . Shortness of Breath    HPI Stephanie Schultz is a 60 y.o. female.  Patient presents to the emergency department with chief complaint of shortness of breath. She states that the shortness of breath started on Monday of this week and has progressively worsened. She states that she was seen by Dr. Radford Pax of cardiology this morning, and was instructed to come to the emergency department for admission to the hospital for COPD exacerbation and to have subsequent left heart cath while in the hospital. Reportedly, patient's pulse oxygenation dropped to 83% prior to arrival today. She is normally on 2.5 L of home oxygen. She reports having some right-sided chest pain that radiates to her neck. She denies any fevers, or chills, nausea, or vomiting.  She states that she has had some non-productive cough.  She has not taken anything for her symptoms.     The history is provided by the patient. No language interpreter was used.    Past Medical History:  Diagnosis Date  . Anemia    chronis  . Anxiety   . Arthritis   . Cancer ([place]) 1990   cervical   . Chronic diastolic CHF (congestive heart failure) ([place]) 08/10/2016  . COPD (chronic obstructive pulmonary disease) (HCC)    Pulmo: [name]  . Diabetes mellitus without complication ([place])    a. A1c 8.3 in 11/2015  . Emphysema   . Gallstones    s/p cholecystectomy  . GERD (gastroesophageal reflux disease)   . Gout   . Hx of cardiac catheterization    a. LHC at [facility] in [place], [place] 09/2008:  Normal coronary arteries EF 70%.  . Hyperlipidemia   . Hypertension   . Morbid obesity ([place])   . OSA on CPAP    CPAP at night   . Pneumonia   . Sickle cell trait ([place])   . Supplemental oxygen dependent    2L CONTINUOUSLEY  . Tobacco abuse    a. up to 3ppd from age 38 to 28, now 1/4 ppd (01/2013)  >> Quit 10/2015    Patient Active Problem List   Diagnosis Date Noted  . Chronic diastolic CHF (congestive heart failure) ([place]) 08/10/2016  . Acute bronchitis 07/22/2016  . Acute respiratory failure ([place]) 07/20/2016  . COPD exacerbation ([place]) 01/23/2016  . S/P laparoscopic cholecystectomy 12/13/2015  . Chronic respiratory failure ([place]) 03/31/2015  . Rib fracture 03/31/2015  . Acute respiratory failure with hypoxia ([place]) 02/17/2015  . Arthritis 12/13/2014  . Gout 03/03/2014  . Diabetic neuropathy ([place]) 03/03/2014  . Carpal tunnel syndrome 03/03/2014  . DM type 2 (diabetes mellitus, type 2) ([place]) 05/22/2013  . Anxiety 05/22/2013  . Transaminasemia 05/09/2013  . Leukocytosis 04/27/2013  . Tobacco abuse   . Chest pain   . Sickle cell trait ([place])   . Morbid obesity ([place])   . Diastolic dysfunction [id number]  . Hoarseness 12/22/2012  . Epistaxis, recurrent 07/08/2012  . GERD (gastroesophageal reflux disease) 09/20/2011  . Pulmonary nodule 09/20/2011  . COPD (chronic obstructive pulmonary disease) ([place]) 04/04/2011  . Diabetes mellitus type 2, controlled ([place]) 04/04/2011  . Hypertension 04/04/2011  . Obstructive sleep apnea 04/04/2011  . Chronic allergic rhinitis 04/04/2011    Past Surgical History:  Procedure Laterality Date  . CARDIAC CATHETERIZATION    . CHOLECYSTECTOMY N/A 12/13/2015   Procedure:  LAPAROSCOPIC CHOLECYSTECTOMY;  Surgeon: Ralene Ok, MD;  [facility];  Service: General;  Laterality: N/A;  . COLONOSCOPY  09/05/2012   Procedure: COLONOSCOPY;  Surgeon: Beryle Beams, MD;  [facility];  Service: Endoscopy;  Laterality: N/A;  . COLONOSCOPY WITH PROPOFOL N/A 09/02/2015   Procedure: COLONOSCOPY WITH PROPOFOL;  Surgeon: Carol Ada, MD;  [facility];  Service: Endoscopy;  Laterality: N/A;  . TUBAL LIGATION      OB History    No data available       Home Medications    Prior to Admission medications   Medication Sig Start  Date End Date Taking? Authorizing Provider  albuterol (PROVENTIL) (2.5 MG/3ML) 0.083% nebulizer solution Take 2.5 mg by nebulization 4 (four) times daily.     Historical Provider, MD  allopurinol (ZYLOPRIM) 100 MG tablet Take 100 mg by mouth daily.    Historical Provider, MD  ALPRAZolam Duanne [place]on) 1 MG tablet Take 1 mg by mouth 2 (two) times daily as needed for anxiety.  04/13/16   Historical Provider, MD  AMITIZA 24 MCG capsule Take 1 capsule by mouth daily. 07/13/16   Historical Provider, MD  amLODipine (NORVASC) 2.5 MG tablet Take 1 tablet (2.5 mg total) by mouth daily. 04/26/16   Liliane Shi, PA-C  azithromycin (ZITHROMAX) 250 MG tablet Take 1 tablet (250 mg total) by mouth daily. 07/22/16   Debbe Odea, MD  budesonide-formoterol (SYMBICORT) 160-4.5 MCG/ACT inhaler Inhale 2 puffs into the lungs 2 (two) times daily. 05/03/15   Collene Gobble, MD  fluticasone (FLONASE) 50 MCG/ACT nasal spray Place 1 spray into both nostrils daily. 03/24/16   Belkys A Regalado, MD  furosemide (LASIX) 20 MG tablet Take 20 mg by mouth daily.    Historical Provider, MD  glipiZIDE (GLUCOTROL) 10 MG tablet Take 10 mg by mouth 2 (two) times daily before a meal.     Historical Provider, MD  guaiFENesin (MUCINEX) 600 MG 12 hr tablet Take 1 tablet (600 mg total) by mouth 2 (two) times daily. 07/22/16   Debbe Odea, MD  HYDROcodone-acetaminophen (NORCO) 7.5-325 MG tablet Take 1 tablet by mouth every 6 (six) hours as needed for moderate pain.    Historical Provider, MD  JANUVIA 100 MG tablet Take 100 mg by mouth daily. 05/24/16   Historical Provider, MD  Linaclotide Rolan Lipa) 145 MCG CAPS capsule Take 145 mcg by mouth daily.     Historical Provider, MD  loratadine (CLARITIN) 10 MG tablet Take 10 mg by mouth daily.    Historical Provider, MD  nystatin cream (MYCOSTATIN) Apply 1 application topically 2 (two) times daily as needed (yeast infection.).     Historical Provider, MD  Olopatadine HCl (PAZEO) 0.7 % SOLN Place 1 drop into both  eyes daily as needed (allergies).     Historical Provider, MD  ondansetron (ZOFRAN) 4 MG tablet Take 1 tablet (4 mg total) by mouth every 6 (six) hours as needed for nausea or vomiting. 05/30/16   Bethany Molt, DO  OXYGEN Inhale 2.5 L into the lungs continuous.    Historical Provider, MD  pantoprazole (PROTONIX) 40 MG tablet Take 1 tablet (40 mg total) by mouth daily at 12 noon. 07/22/16   Debbe Odea, MD  pioglitazone (ACTOS) 30 MG tablet Take 30 mg by mouth daily. 12/29/15   Historical Provider, MD  pravastatin (PRAVACHOL) 20 MG tablet Take 20 mg by mouth daily.    Historical Provider, MD  predniSONE (DELTASONE) 10 MG tablet Take 6 tablets (60 mg  total) by mouth daily with breakfast. Taper by 10 mg daily until tablets are finished 07/22/16   Debbe Odea, MD  pregabalin (LYRICA) 75 MG capsule Take 75 mg by mouth 2 (two) times daily.    Historical Provider, MD  PROAIR HFA 108 (90 Base) MCG/ACT inhaler INHALE 2 PUFFS BY MOUTH EVERY 4 HOURS AS NEEDED FOR WHEEZING 06/14/16   Collene Gobble, MD  sodium chloride (OCEAN) 0.65 % SOLN nasal spray Place 1 spray into both nostrils as needed for congestion.    Historical Provider, MD  tiotropium (SPIRIVA) 18 MCG inhalation capsule Place 1 capsule (18 mcg total) into inhaler and inhale daily. 05/03/15   Collene Gobble, MD  triamterene-hydrochlorothiazide (DYAZIDE) 37.5-25 MG per capsule Take 1 capsule by mouth daily.    Historical Provider, MD  zolpidem (AMBIEN) 10 MG tablet Take 10 mg by mouth at bedtime.    Historical Provider, MD    Family History Family History  Problem Relation Age of Onset  . Other Father     unaware of father's medical history  . Diabetes Mother     alive @ 56  . Myasthenia gravis Mother   . Lung cancer Paternal Aunt   . Lung cancer Paternal Grandfather   . Other      multiple siblings a&w.  . Heart attack Neg Hx   . Heart failure Neg Hx     Social History Social History  Substance Use Topics  . Smoking status: Former Smoker      Packs/day: 0.50    Years: 36.00    Types: Cigarettes    Quit date: 11/11/2015  . Smokeless tobacco: Never Used     Comment: Approx 90 pk-yrs (up to 3ppd until ~ 2009). Smoking 3 cigs per day now.  . Alcohol use No     Allergies   Acetaminophen   Review of Systems Review of Systems  Respiratory: Positive for shortness of breath.   Cardiovascular: Positive for chest pain.  All other systems reviewed and are negative.    Physical Exam Updated Vital Signs Ht 5\' 5"  (1.651 m)   Wt 106.1 kg   LMP 06/04/2000 (LMP Unknown)   SpO2 96%   BMI 38.94 kg/m   Physical Exam  Constitutional: She is oriented to person, place, and time. She appears well-developed and well-nourished.  HENT:  Head: Normocephalic and atraumatic.  Eyes: Conjunctivae and EOM are normal. Pupils are equal, round, and reactive to light.  Neck: Normal range of motion. Neck supple.  Cardiovascular: Normal rate and regular rhythm.  Exam reveals no gallop and no friction rub.   No murmur heard. Pulmonary/Chest: No respiratory distress. She has no wheezes. She has no rales. She exhibits no tenderness.  Diminished lung sounds on right Speaks in 3-5 word sentences  Abdominal: Soft. Bowel sounds are normal. She exhibits no distension and no mass. There is no tenderness. There is no rebound and no guarding.  Musculoskeletal: Normal range of motion. She exhibits no edema or tenderness.  No significant peripheral edema  Neurological: She is alert and oriented to person, place, and time.  Skin: Skin is warm and dry.  Psychiatric: She has a normal mood and affect. Her behavior is normal. Judgment and thought content normal.  Nursing note and vitals reviewed.    ED Treatments / Results  Labs (all labs ordered are listed, but only abnormal results are displayed) Labs Reviewed  [place],  ED    EKG  EKG  Interpretation  Date/Time:  Friday August 10 2016 10:04:55 EDT Ventricular Rate:  91 PR Interval:    QRS Duration: 108 QT Interval:  369 QTC Calculation: [id number] R Axis:   43 Text Interpretation:  Sinus rhythm Baseline wander in lead(s) I II no significant ST/T abnormalities Interpretation limited secondary to artifact Confirmed by KNOTT MD, DANIEL [id number]) on 08/10/2016 10:08:34 AM       Radiology No results found.  Procedures Procedures (including critical care time)  Medications Ordered in ED Medications  ipratropium-albuterol (DUONEB) 0.5-2.5 (3) MG/3ML nebulizer solution 3 mL (not administered)  methylPREDNISolone sodium succinate (SOLU-MEDROL) 125 mg/2 mL injection 125 mg (not administered)     Initial Impression / Assessment and Plan / ED Course  I have reviewed the triage vital signs and the nursing notes.  Pertinent labs & imaging results that were available during my care of the patient were reviewed by me and considered in my medical decision making (see chart for details).  Clinical Course   Patient sent to ED by cardiology for COPD exacerbation. Patient does have some decreased breath sounds on the right. Will give breathing treatment and steroids. Dr. Theodosia Blender recommendation was that the patient be admitted to the hospital for the exacerbation. I feel that is appropriate, as she was recently admitted for the same, and has had significantly decreased pulse oxygenation and has been feeling worse over the past 4 days. Will check troponin and EKG. Otherwise, plan for admission to medicine with cardiology consultation. Per Dr. Theodosia Blender note this morning, his seems as the cardiology would like to follow along and perhaps perform a left heart cath while the patient is in the hospital.  CXR show atelectasis vs infiltrate, patient is afebrile and doesn't have a leukocytosis, but does have worsening productive cough and back/rib pain.  It is reasonable to think that the CXR is  lagging behind the patient's symptoms.  Increased O2 requirement.  Feels very SOB while walking.  She was recently admitted to the hospital, and should probably be covered for HCAP given her worsening symptoms.  Will also discuss with Dr. Radford Pax to arrange for cardiology to see patient during admission.  Discussed with Dr. Laneta Simmers, who agrees with plan for hospitalist consultation.  Appreciate Black, NP and Dr. Denton Brick for admitting the patient.  Black, NP states that she will follow-up with cardiology about consultation.  Final Clinical Impressions(s) / ED Diagnoses   Final diagnoses:  COPD exacerbation [facility])    New Prescriptions New Prescriptions   No medications on file     Montine Circle, PA-C 08/10/16 1256    Leo Grosser, MD 08/10/16 1827

## 2016-08-10 NOTE — ED Triage Notes (Signed)
Pt  Coming from heart care for dyspnea with exertion since Monday. Pt seen at [facility] for respiratory virus. Pt hx of COPD and on 2.5 L O2 at all times. Pt ambulated with EMS and dropped O2 dropped to 85%. EDP at bedside.

## 2016-08-11 DIAGNOSIS — J9621 Acute and chronic respiratory failure with hypoxia: Secondary | ICD-10-CM

## 2016-08-11 DIAGNOSIS — I519 Heart disease, unspecified: Secondary | ICD-10-CM

## 2016-08-11 LAB — TROPONIN I: Troponin I: <0.03 ng/mL (ref <0.03)

## 2016-08-11 LAB — BASIC METABOLIC PANEL
Anion gap: 8 (ref 5–15)
BUN: 18 mg/dL (ref 6–20)
CHLORIDE: 102 mmol/L (ref 101–111)
CO2: 27 mmol/L (ref 22–32)
Calcium: 9.6 mg/dL (ref 8.9–10.3)
Creatinine, Ser: 1.15 mg/dL — ABNORMAL HIGH (ref 0.44–1.00)
GFR calc non Af Amer: 52 mL/min — ABNORMAL LOW (ref >60)
Glucose, Bld: 361 mg/dL — ABNORMAL HIGH (ref 65–99)
POTASSIUM: 4.2 mmol/L (ref 3.5–5.1)
SODIUM: 137 mmol/L (ref 135–145)

## 2016-08-11 LAB — CBC
HEMATOCRIT: 33.8 % — AB (ref 36.0–46.0)
HEMOGLOBIN: 11.2 g/dL — AB (ref 12.0–15.0)
MCH: 26.7 pg (ref 26.0–34.0)
MCHC: 33.1 g/dL (ref 30.0–36.0)
MCV: 80.7 fL (ref 78.0–100.0)
PLATELETS: 215 10*3/uL (ref 150–400)
RBC: 4.19 MIL/uL (ref 3.87–5.11)
RDW: 15.4 % (ref 11.5–15.5)
WBC: 7.5 10*3/uL (ref 4.0–10.5)

## 2016-08-11 LAB — GLUCOSE, CAPILLARY
GLUCOSE-CAPILLARY: 447 mg/dL — AB (ref 65–99)
GLUCOSE-CAPILLARY: 404 mg/dL — AB (ref 65–99)
Glucose-Capillary: 381 mg/dL — ABNORMAL HIGH (ref 65–99)
Glucose-Capillary: 264 mg/dL — ABNORMAL HIGH (ref 65–99)

## 2016-08-11 MED ORDER — INSULIN GLARGINE 100 UNIT/ML ~~LOC~~ SOLN
10.0000 [IU] | SUBCUTANEOUS | Status: DC
  Administered 2016-08-11 – 2016-08-15 (×5): 10 [IU] via SUBCUTANEOUS
  Filled 2016-08-11 (×7): qty 0.1

## 2016-08-11 MED ORDER — INSULIN ASPART 100 UNIT/ML ~~LOC~~ SOLN
0.0000 [IU] | Freq: Three times a day (TID) | SUBCUTANEOUS | Status: DC
  Administered 2016-08-11 (×2): 20 [IU] via SUBCUTANEOUS
  Administered 2016-08-12 (×2): 4 [IU] via SUBCUTANEOUS
  Administered 2016-08-12: 15 [IU] via SUBCUTANEOUS
  Administered 2016-08-13: 7 [IU] via SUBCUTANEOUS
  Administered 2016-08-13: 15 [IU] via SUBCUTANEOUS
  Administered 2016-08-13: 11 [IU] via SUBCUTANEOUS
  Administered 2016-08-14: 15 [IU] via SUBCUTANEOUS
  Administered 2016-08-14: 4 [IU] via SUBCUTANEOUS
  Administered 2016-08-14: 7 [IU] via SUBCUTANEOUS
  Administered 2016-08-15: 15 [IU] via SUBCUTANEOUS
  Administered 2016-08-15: 7 [IU] via SUBCUTANEOUS

## 2016-08-11 MED ORDER — INSULIN GLARGINE 100 UNIT/ML ~~LOC~~ SOLN
10.0000 [IU] | Freq: Once | SUBCUTANEOUS | Status: AC
  Administered 2016-08-11: 10 [IU] via SUBCUTANEOUS
  Filled 2016-08-11: qty 0.1

## 2016-08-11 NOTE — Progress Notes (Signed)
Patient setup on CPAP nasal mask with 3L O2 bleed in. 4 cmH2O and tolerating well at this time.

## 2016-08-11 NOTE — Progress Notes (Signed)
Initial Nutrition Assessment  DOCUMENTATION CODES:  Obesity unspecified  INTERVENTION:  Reinforced the outpatient diet education pt received 1 week ago with additional recommendations  Unfortunately, pt is largely limited in her ability to purchase and prepare meals by her poor health  NUTRITION DIAGNOSIS:  Increased nutrient needs related to her multitude of chronic disease states including chf, copd, DM as evidenced by estimated nutritional requirements for these conditions  GOAL:  Patient will meet greater than or equal to 90% of their needs  MONITOR:  PO intake, Labs, Weight trends, I & O's  REASON FOR ASSESSMENT:  Consult Assessment of nutrition requirement/status  ASSESSMENT:  60 y/o female PMHx COPD, CHF, DM, HTN, Cervical Cancer, OSA, Morbid obesity, GERD, Anxiety, HLD. She presented with severe DOE. Worked up for AoC resp failure related to COPD exacerbation.   Pt was seen [facility] for education on Diabetic control and weight management. RD reviewed the instructions that the pt had been given per that appointment.   Pt reports that she has not gone shopping since speaking with that RD. As a result she has not bought the lower sodium soups or the diet beverages. She says she will not ask her daughters for help as they are both single mothers, each of whom are also struggling financially and with very little excess time. She does state she does not use a salt shaker anymore "Im going to throw it out when I get home".   RD went through her dietary recall. Her breakfast is typically cereal w/ 2 boiled eggs. From a CHF perspective this if good. She eats sandwiches with deli meat at Lunch. She says she recently has gotten so sick of bread that she just eats the [place]. At dinner she makes sure to eat frozen vegetables. She notes eating chicken/turkey and trying fish recently.   There is no doubt that she is very motivated to  lose weight and become healthier, but her very poor health severely restricts her in her ability to purchase and prepare healthier options. As such, she is largely dependant on convenience items (such as deli meat) that are naturally higher in sodium.   RD would have liked to share more information verbally with patient, but she is extremely tangential and kept asking about the medications (namely her diuretics and potassium) she was on and how her medications at home need to be more like the ones she is on now.   Labs: Severely hyperglycemic Medications: Lasix, abx, insulin, prednisone was stopped at 1:00 pm. Ppi, linzess  Recent Labs Lab 08/10/16 1022 08/11/16 0413  NA 137 137  K 3.7 4.2  CL 104 102  CO2 25 27  BUN 15 18  CREATININE 1.02* 1.15*  CALCIUM 9.6 9.6  GLUCOSE 242* 361*   Diet Order:  Diet Heart Room service appropriate? Yes; Fluid consistency: Thin Diet heart healthy/carb modified Room service appropriate? Yes; Fluid consistency: Thin  Skin:  Reviewed, no issues  Last BM:  9/29  Height:   Ht Readings from Last 1 Encounters:  08/10/16 5\' 5"  (1.651 m)    Weight:  Wt Readings from Last 1 Encounters:  08/11/16 231 lb 0.7 oz (104.8 kg)   Wt Readings from Last 10 Encounters:  08/11/16 231 lb 0.7 oz (104.8 kg)  08/10/16 234 lb 6.4 oz (106.3 kg)  08/02/16 236 lb (107 kg)  07/22/16 231 lb 8 oz (105 kg)  06/13/16 216 lb (98 kg)  04/10/16  236 lb 6.4 oz (107.2 kg)  04/06/16 236 lb (107 kg)  03/23/16 234 lb 3.2 oz (106.2 kg)  01/25/16 226 lb 6.6 oz (102.7 kg)  01/09/16 229 lb (103.9 kg)   Ideal Body Weight:  56.82 kg  BMI:  Body mass index is 38.45 kg/m.  Estimated Nutritional Needs:  Kcal:  1600-1800 kcals (15-17 kcals/kg bw) Protein:  65-75 g (1.1-1.3 g/kg ibw) Fluid:  Per MD  EDUCATION NEEDS:  Education needs addressed  Burtis Junes RD, LDN, CNSC Clinical Nutrition Pager: [phone] 08/11/2016 6:45 PM

## 2016-08-11 NOTE — Progress Notes (Signed)
Patient is alert with Random talking with restlessness. Blood sugars levels in the high 300s need adjust to sliding scale, Weaning 02 to baseline 2.5, Iv steroids and antibiotics with Duo nebs every 4 hours. Antiacid given early per patient request. Overall states she feels better.

## 2016-08-11 NOTE — Progress Notes (Signed)
PROGRESS NOTE    Stephanie Schultz  [id number] DOB: 21-May-1956 DOA: 08/10/2016 PCP: Barbette Merino, MD   Brief Narrative:  60 y.o. female with medical history significant of  COPD on home oxygen, diastolic CHF, diabetes, hypertension, chronic dyspnea with exertion presents to the emergency department from Dr. Theodosia Blender office chief complaint of worsening dyspnea on exertion. Initial evaluation reveals acute on chronic respiratory failure likely related to COPD exacerbation.  Assessment & Plan:   Principal Problem:   Acute on chronic respiratory failure (HCC) - Continue supplemental oxygen - secondary to COPD exacerbation  Active Problems:   COPD (chronic obstructive pulmonary disease) (HCC) - continue bronchodilators, spiriva, levaquin    Hypertension - continue amlodipine    Obstructive sleep apnea - stable    GERD (gastroesophageal reflux disease) - stable on protonix   Diastolic dysfunction   Tobacco abuse   Morbid obesity ([place])   DM type 2 (diabetes mellitus, type 2) ([place]) - continue SSI    Anxiety - continue xanax prn, stable   DVT prophylaxis: Lovenox Code Status: Full Family Communication: None at bedside Disposition Plan:  Pending improvement in respiratory status   Consultants:   None   Procedures: None   Antimicrobials: Levaquin   Subjective: Pt has no new complaints. No acute issues overnight.  Objective: Vitals:   08/11/16 0830 08/11/16 1010 08/11/16 1220 08/11/16 1415  BP:  (!) 152/69 132/67   Pulse:  (!) 112 (!) 106   Resp:  20 20   Temp:  97.8 F (36.6 C) 98 F (36.7 C)   TempSrc:  Oral Oral   SpO2: 96% 93% 92% 92%  Weight:      Height:        Intake/Output Summary (Last 24 hours) at 08/11/16 1715 Last data filed at 08/11/16 1437  Gross per 24 hour  Intake             1240 ml  Output             2020 ml  Net             -780 ml   Filed Weights   08/10/16 1006 08/11/16 0515  Weight: 106.1 kg (234 lb) 104.8 kg (231 lb 0.7 oz)      Examination:  General exam: Appears calm and comfortable, in nad. Respiratory system: poor inspiratory effort, equal chest rise, mild exp wheeze Cardiovascular system: S1 & S2 heard, RRR. No JVD, murmurs, rubs, gallops or clicks.  Gastrointestinal system: Abdomen is nondistended, soft and nontender. No organomegaly or masses felt. Normal bowel sounds heard. Obese, difficult exam. Central nervous system: Alert and oriented. No focal neurological deficits. Extremities: Symmetric 5 x 5 power. Skin: No rashes, lesions or ulcers Psychiatry: Judgement and insight appear normal. Mood & affect appropriate.   Data Reviewed: I have personally reviewed following labs and imaging studies  CBC:  Recent Labs Lab 08/10/16 1022 08/11/16 0413  WBC 5.9 7.5  NEUTROABS 4.0  --   HGB 11.4* 11.2*  HCT 34.4* 33.8*  MCV 80.4 80.7  PLT 212 123456   Basic Metabolic Panel:  Recent Labs Lab 08/10/16 1022 08/11/16 0413  NA 137 137  K 3.7 4.2  CL 104 102  CO2 25 27  GLUCOSE 242* 361*  BUN 15 18  CREATININE 1.02* 1.15*  CALCIUM 9.6 9.6   GFR: Estimated Creatinine Clearance: 64.8 mL/min (by C-G formula based on SCr of 1.15 mg/dL (H)). Liver Function Tests: No results for input(s): AST, ALT, ALKPHOS, BILITOT, PROT, ALBUMIN  in the last 168 hours. No results for input(s): LIPASE, AMYLASE in the last 168 hours. No results for input(s): AMMONIA in the last 168 hours. Coagulation Profile: No results for input(s): INR, PROTIME in the last 168 hours. Cardiac Enzymes:  Recent Labs Lab 08/11/16 1040  TROPONINI <0.03   BNP (last 3 results) No results for input(s): PROBNP in the last 8760 hours. HbA1C: No results for input(s): HGBA1C in the last 72 hours. CBG:  Recent Labs Lab 08/10/16 1844 08/10/16 2111 08/11/16 0645 08/11/16 1344 08/11/16 1632  GLUCAP 390* 359* 381* 447* 404*   Lipid Profile: No results for input(s): CHOL, HDL, LDLCALC, TRIG, CHOLHDL, LDLDIRECT in the last 72  hours. Thyroid Function Tests: No results for input(s): TSH, T4TOTAL, FREET4, T3FREE, THYROIDAB in the last 72 hours. Anemia Panel: No results for input(s): VITAMINB12, FOLATE, FERRITIN, TIBC, IRON, RETICCTPCT in the last 72 hours. Sepsis Labs: No results for input(s): PROCALCITON, LATICACIDVEN in the last 168 hours.  No results found for this or any previous visit (from the past 240 hour(s)).       Radiology Studies: Dg Chest 2 View  Result Date: 08/10/2016 CLINICAL DATA:  Shortness of breath. EXAM: CHEST  2 VIEW COMPARISON:  07/19/2016. FINDINGS: Mediastinum hilar structures are normal. Cardiomegaly with normal pulmonary vascularity. Low lung volumes with mild bibasilar atelectasis and/or infiltrates. No pleural effusion or pneumothorax. Multiple left rib fractures are noted. These appear old. IMPRESSION: 1. Low lung volumes with mild bibasilar atelectasis and/or infiltrates. 2.  Stable mild cardiomegaly.  No pulmonary venous congestion. Electronically Signed   By: Marcello Moores  Register   On: 08/10/2016 11:17        Scheduled Meds: . albuterol  2.5 mg Nebulization Q6H  . allopurinol  100 mg Oral Daily  . amLODipine  2.5 mg Oral Daily  . enoxaparin (LOVENOX) injection  40 mg Subcutaneous Q24H  . fluticasone  1 spray Each Nare Daily  . furosemide  20 mg Oral Daily  . guaiFENesin  600 mg Oral BID  . insulin aspart  0-20 Units Subcutaneous TID WC  . insulin aspart  0-5 Units Subcutaneous QHS  . insulin glargine  10 Units Subcutaneous Q24H  . levofloxacin (LEVAQUIN) IV  500 mg Intravenous Q24H  . linaclotide  145 mcg Oral Daily  . pantoprazole  40 mg Oral Daily  . pravastatin  20 mg Oral Daily  . pregabalin  75 mg Oral BID  . sodium chloride flush  3 mL Intravenous Q12H  . sodium chloride flush  3 mL Intravenous Q12H  . tiotropium  18 mcg Inhalation Daily  . zolpidem  5 mg Oral QHS   Continuous Infusions:    LOS: 1 day   Time spent: > 35 minutes  Velvet Bathe, MD [facility] Pager [phone]  If 7PM-7AM, please contact night-coverage [email or web address] Password [email or web address] 08/11/2016, 5:15 PM

## 2016-08-11 NOTE — Progress Notes (Signed)
Physical Therapy Evaluation & Discharge Patient Details Name: Stephanie Schultz MRN: [id number] DOB: [birth year] Today's Date: 08/11/2016   History of Present Illness  60 y.o. female admitted for SOB, with a hx of DM2, HTN, COPD on chronic O2, OSA on CPAP, prior tobacco abuse.  Clinical Impression  Patient cleared for PT evaluation by nursing.  Patient demonstrates Independent room mobility, and able to instruct in self care needs.  Ambulatory with O2, without assistive device with Supervision, cues for appropriate pacing and rest intervals provided.  Patient education for general activity dosing, and not overexerting self, watching for warning signs to better pace activity.  Patient remains with medical complexity due to multiple co-morbid conditions, and is able to ambulate with staff.  No further need noted for skilled PT services, will DC PT consult.      Follow Up Recommendations No PT follow up    Equipment Recommendations  None recommended by PT    Recommendations for Other Services       Precautions / Restrictions Restrictions Weight Bearing Restrictions: No      Mobility  Bed Mobility Overal bed mobility: Independent                Transfers Overall transfer level: Independent Equipment used: None             General transfer comment: Supplemental O2  Ambulation/Gait Ambulation/Gait assistance: Modified independent (Device/Increase time) Ambulation Distance (Feet): 150 Feet (x2) Assistive device: None Gait Pattern/deviations: WFL(Within Functional Limits)   Gait velocity interpretation: at or above normal speed for age/gender General Gait Details: Patient reports back pain with extended walking, Desaturates over distance, can recover with standing rest break.  Stairs            Wheelchair Mobility    Modified Rankin (Stroke Patients Only)       Balance Overall balance assessment: No apparent balance deficits (not formally assessed)                                            Pertinent Vitals/Pain Pain Assessment: No/denies pain    Home Living Family/patient expects to be discharged to:: Private residence Living Arrangements: Alone Available Help at Discharge: Personal care attendant Type of Home: Apartment Home Access: Level entry     Home Layout: One level Home Equipment: Walker - 4 wheels;Cane - single point;Electric scooter Additional Comments: Uses O2 at home    Prior Function Level of Independence: Independent with assistive device(s)         Comments: uses assistive device only when needed, chronic oxygen 2.5 -3L at baseline     Hand Dominance        Extremity/Trunk Assessment   Upper Extremity Assessment: Overall WFL for tasks assessed;Defer to OT evaluation           Lower Extremity Assessment: Overall WFL for tasks assessed         Communication   Communication: No difficulties  Cognition Arousal/Alertness: Awake/alert Behavior During Therapy: WFL for tasks assessed/performed Overall Cognitive Status: Within Functional Limits for tasks assessed                      General Comments      Exercises     Assessment/Plan    PT Assessment Patent does not need any further PT services  PT Problem List  PT Treatment Interventions      PT Goals (Current goals can be found in the Care Plan section)       Frequency     Barriers to discharge        Co-evaluation               End of Session Equipment Utilized During Treatment: Gait belt;Oxygen Activity Tolerance: Patient tolerated treatment well Patient left: in bed;with call bell/phone within reach Nurse Communication: Mobility status         Time: 1630-1700 PT Time Calculation (min) (ACUTE ONLY): 30 min   Charges:   PT Evaluation $PT Eval Low Complexity: 1 Procedure PT Treatments $Gait Training: 8-22 mins   PT G Codes:        Tameeka Luo L 09/05/2016, 5:20 PM

## 2016-08-11 NOTE — Progress Notes (Signed)
PT Cancellation Note  Patient Details Name: Stephanie Schultz MRN: [id number] DOB: 13-Nov-1955   Cancelled Treatment:    Reason Eval/Treat Not Completed: Medical issues which prohibited therapy  Nurse reports patient 'having issues' at this time, request to try back later.   [name] L 08/11/2016, 11:11 AM

## 2016-08-11 NOTE — Progress Notes (Signed)
Patient Name: Stephanie Schultz Date of Encounter: 08/11/2016  Hospital Problem List     Principal Problem:   Acute on chronic respiratory failure ([place]) Active Problems:   COPD (chronic obstructive pulmonary disease) (HCC)   Hypertension   Obstructive sleep apnea   GERD (gastroesophageal reflux disease)   Diastolic dysfunction   Tobacco abuse   Morbid obesity ([place])   DM type 2 (diabetes mellitus, type 2) ([place])   Anxiety   COPD exacerbation [facility])    Patient Profile     Stephanie Schultz is a 60 y.o. female with a hx of DM2, HTN, COPD on chronic O2, OSA on CPAP, prior tobacco abuse. LHC at [facility] in [place], [place] 09/2008: Normal coronary arteries EF 70%. she was admitted with acute SOB.   Subjective   Episode of acute dyspnea earlier today.  However, no objective evidence of ischemia.  She was very anxious.    Inpatient Medications    . albuterol  2.5 mg Nebulization Q6H  . allopurinol  100 mg Oral Daily  . amLODipine  2.5 mg Oral Daily  . enoxaparin (LOVENOX) injection  40 mg Subcutaneous Q24H  . fluticasone  1 spray Each Nare Daily  . furosemide  20 mg Oral Daily  . guaiFENesin  600 mg Oral BID  . insulin aspart  0-15 Units Subcutaneous TID WC  . insulin aspart  0-5 Units Subcutaneous QHS  . levofloxacin (LEVAQUIN) IV  500 mg Intravenous Q24H  . linaclotide  145 mcg Oral Daily  . methylPREDNISolone (SOLU-MEDROL) injection  60 mg Intravenous Q6H  . pantoprazole  40 mg Oral Daily  . pravastatin  20 mg Oral Daily  . pregabalin  75 mg Oral BID  . sodium chloride flush  3 mL Intravenous Q12H  . sodium chloride flush  3 mL Intravenous Q12H  . tiotropium  18 mcg Inhalation Daily  . zolpidem  5 mg Oral QHS    Vital Signs    Vitals:   08/10/16 2013 08/11/16 0250 08/11/16 0515 08/11/16 0830  BP:   136/66   Pulse:   (!) 102   Resp:   20   Temp:   97.9 F (36.6 C)   TempSrc:   Oral   SpO2: 96% 94% 95% 96%  Weight:   231 lb 0.7 oz (104.8 kg)   Height:         Intake/Output Summary (Last 24 hours) at 08/11/16 1008 Last data filed at 08/11/16 1004  Gross per 24 hour  Intake             1060 ml  Output             1320 ml  Net             -260 ml   Filed Weights   08/10/16 1006 08/11/16 0515  Weight: 234 lb (106.1 kg) 231 lb 0.7 oz (104.8 kg)    Physical Exam    GEN: Well nourished, well developed, in  no acute distress.  Neck: Supple, no JVD, carotid bruits, or masses. Cardiac: RRR, no rubs, or gallops. No clubbing, cyanosis, no edema.  Radials/DP/PT 2+ and equal bilaterally.  Respiratory:  Respirations  regular and mildly labored, clear to auscultation bilaterally. GI: Soft, nontender, nondistended, BS + x 4. Neuro:  Strength and sensation are intact.   Labs    CBC  Recent Labs  08/10/16 1022 08/11/16 0413  WBC 5.9 7.5  NEUTROABS 4.0  --   HGB 11.4* 11.2*  HCT  34.4* 33.8*  MCV 80.4 80.7  PLT 212 123456   Basic Metabolic Panel  Recent Labs  08/10/16 1022 08/11/16 0413  NA 137 137  K 3.7 4.2  CL 104 102  CO2 25 27  GLUCOSE 242* 361*  BUN 15 18  CREATININE 1.02* 1.15*  CALCIUM 9.6 9.6   Liver Function Tests No results for input(s): AST, ALT, ALKPHOS, BILITOT, PROT, ALBUMIN in the last 72 hours. No results for input(s): LIPASE, AMYLASE in the last 72 hours. Cardiac Enzymes No results for input(s): CKTOTAL, CKMB, CKMBINDEX, TROPONINI in the last 72 hours. BNP Invalid input(s): POCBNP D-Dimer No results for input(s): DDIMER in the last 72 hours. Hemoglobin A1C No results for input(s): HGBA1C in the last 72 hours. Fasting Lipid Panel No results for input(s): CHOL, HDL, LDLCALC, TRIG, CHOLHDL, LDLDIRECT in the last 72 hours. Thyroid Function Tests No results for input(s): TSH, T4TOTAL, T3FREE, THYROIDAB in the last 72 hours.  Invalid input(s): FREET3    ECG    NSR, no acute ST T wave changes.  08/11/2016  Radiology    Dg Chest 2 View  Result Date: 08/10/2016 CLINICAL DATA:  Shortness of breath.  EXAM: CHEST  2 VIEW COMPARISON:  07/19/2016. FINDINGS: Mediastinum hilar structures are normal. Cardiomegaly with normal pulmonary vascularity. Low lung volumes with mild bibasilar atelectasis and/or infiltrates. No pleural effusion or pneumothorax. Multiple left rib fractures are noted. These appear old. IMPRESSION: 1. Low lung volumes with mild bibasilar atelectasis and/or infiltrates. 2.  Stable mild cardiomegaly.  No pulmonary venous congestion. Electronically Signed   By: Marcello Moores  Register   On: 08/10/2016 11:17   Dg Chest 2 View  Result Date: 07/19/2016 CLINICAL DATA:  Worsening shortness of breath, history CHF, COPD, non compliant with diuretics, type II diabetes mellitus, former smoker, hypertension EXAM: CHEST  2 VIEW COMPARISON:  03/22/2016 FINDINGS: Enlargement of cardiac silhouette with pulmonary vascular congestion. Atherosclerotic calcification aorta. Minimal RIGHT basilar atelectasis. Lungs otherwise clear. No pleural effusion or pneumothorax. Deformities at old LEFT posterior LEFT rib fractures. No acute osseous findings. IMPRESSION: Enlargement of cardiac silhouette with pulmonary vascular congestion. Minimal RIGHT basilar atelectasis. Aortic atherosclerosis. Electronically Signed   By: Lavonia Dana M.D.   On: 07/19/2016 19:12   US Breast Ltd Uni Right Inc Axilla  Result Date: 08/07/2016 CLINICAL DATA:  60 year old female who initially presented for evaluation of an enlarging right breast mass identified on CT. She was unable to stay for her ultrasound at that time, and presents today for that exam. EXAM: ULTRASOUND OF THE RIGHT BREAST COMPARISON:  Previous exam(s). FINDINGS: There is a circumscribed oval anechoic mass at 6 o'clock, 6 cm from the nipple with increased posterior acoustic enhancement measuring 1.6 cm, consistent with a benign cyst. IMPRESSION: The right breast mass is consistent with a benign cyst. RECOMMENDATION: Screening mammogram in one year.(Code:[id number]-B-01Y) I have discussed  the findings and recommendations with the patient. Results were also provided in writing at the conclusion of the visit. If applicable, a reminder letter will be sent to the patient regarding the next appointment. BI-RADS CATEGORY  2: Benign Finding(s) Electronically Signed   By: Ammie Ferrier M.D.   On: 08/07/2016 13:40   Mm Diag Breast Tomo Bilateral  Result Date: 07/19/2016 CLINICAL DATA:  60 year old female referred for an enlarging right breast nodule seen on recent abdominal CT dated 06/13/2016. EXAM: 2D DIGITAL DIAGNOSTIC BILATERAL MAMMOGRAM WITH CAD AND ADJUNCT TOMO COMPARISON:  Previous exam(s). ACR Breast Density Category b: There are scattered areas of  fibroglandular density. FINDINGS: There is a 19 mm oval, circumscribed mass within the right breast at 6 o'clock, corresponding to the mass seen on recent CT within the right breast. No suspicious mass, calcifications, or other abnormality is identified within the left breast. Mammographic images were processed with CAD. IMPRESSION: Incomplete evaluation. RECOMMENDATION: A targeted right breast ultrasound is needed for further evaluation of the right breast mass seen mammographically. An order was not available for the ultrasound to be performed today. The patient noted that she was short of breath during her time at the [facility] and stated that she thought she needed to be seen in the emergency room. Transport was called and the patient was transported to the emergency room. I have discussed the findings and recommendations with the patient. Results were also provided in writing at the conclusion of the visit. If applicable, a reminder letter will be sent to the patient regarding the next appointment. BI-RADS CATEGORY  0: Incomplete. Need additional imaging evaluation and/or prior mammograms for comparison. Electronically Signed   By: Pamelia Hoit M.D.   On: 07/19/2016 18:23    Assessment & Plan    CHEST PAIN:  Enzyme x 1 is negative.     ACUTE ON CHRONIC DYSPNEA:   Thought to be related to COPD.   BNP was normal.    HTN:     BP is mildly intermittently elevated.  She is very anxious.    Signed, Minus Breeding, MD  08/11/2016, 10:08 AM

## 2016-08-12 DIAGNOSIS — J449 Chronic obstructive pulmonary disease, unspecified: Secondary | ICD-10-CM

## 2016-08-12 LAB — GLUCOSE, CAPILLARY
GLUCOSE-CAPILLARY: 305 mg/dL — AB (ref 65–99)
Glucose-Capillary: 175 mg/dL — ABNORMAL HIGH (ref 65–99)
Glucose-Capillary: 180 mg/dL — ABNORMAL HIGH (ref 65–99)
Glucose-Capillary: 301 mg/dL — ABNORMAL HIGH (ref 65–99)

## 2016-08-12 LAB — HEMOGLOBIN A1C
HEMOGLOBIN A1C: 8.2 % — AB (ref 4.8–5.6)
MEAN PLASMA GLUCOSE: 189 mg/dL

## 2016-08-12 MED ORDER — GUAIFENESIN-DM 100-10 MG/5ML PO SYRP
5.0000 mL | ORAL_SOLUTION | ORAL | Status: DC | PRN
  Administered 2016-08-12 – 2016-08-13 (×3): 5 mL via ORAL
  Filled 2016-08-12 (×3): qty 5

## 2016-08-12 MED ORDER — ALBUTEROL SULFATE (2.5 MG/3ML) 0.083% IN NEBU
2.5000 mg | INHALATION_SOLUTION | Freq: Four times a day (QID) | RESPIRATORY_TRACT | Status: DC
  Administered 2016-08-13 – 2016-08-15 (×12): 2.5 mg via RESPIRATORY_TRACT
  Filled 2016-08-12 (×12): qty 3

## 2016-08-12 MED ORDER — METHYLPREDNISOLONE SODIUM SUCC 40 MG IJ SOLR
40.0000 mg | Freq: Every day | INTRAMUSCULAR | Status: DC
  Administered 2016-08-12 – 2016-08-15 (×4): 40 mg via INTRAVENOUS
  Filled 2016-08-12 (×4): qty 1

## 2016-08-12 NOTE — Progress Notes (Signed)
Patient Name: Stephanie Schultz Date of Encounter: 08/12/2016  Hospital Problem List     Principal Problem:   Acute on chronic respiratory failure ([place]) Active Problems:   COPD (chronic obstructive pulmonary disease) (H[facility])   Hypertension   Obstructive sleep apnea   GERD (gastroesophageal reflux disease)   Diastolic dysfunction   Tobacco abuse   Morbid obesity ([place])   DM type 2 (diabetes mellitus, type 2) ([place])   Anxiety   COPD exacerbation [facility])    Patient Profile     Stephanie Schultz is a 60 y.o. female with a hx of DM2, HTN, COPD on chronic O2, OSA on CPAP, prior tobacco abuse. LHC at [facility] in [place], [place] 09/2008: Normal coronary arteries EF 70%. she was admitted with acute SOB.   Subjective   Back pain.  Still SOB.    Inpatient Medications    . albuterol  2.5 mg Nebulization Q6H  . allopurinol  100 mg Oral Daily  . amLODipine  2.5 mg Oral Daily  . enoxaparin (LOVENOX) injection  40 mg Subcutaneous Q24H  . fluticasone  1 spray Each Nare Daily  . furosemide  20 mg Oral Daily  . guaiFENesin  600 mg Oral BID  . insulin aspart  0-20 Units Subcutaneous TID WC  . insulin aspart  0-5 Units Subcutaneous QHS  . insulin glargine  10 Units Subcutaneous Q24H  . levofloxacin (LEVAQUIN) IV  500 mg Intravenous Q24H  . linaclotide  145 mcg Oral Daily  . pantoprazole  40 mg Oral Daily  . pravastatin  20 mg Oral Daily  . pregabalin  75 mg Oral BID  . sodium chloride flush  3 mL Intravenous Q12H  . sodium chloride flush  3 mL Intravenous Q12H  . tiotropium  18 mcg Inhalation Daily  . zolpidem  5 mg Oral QHS    Vital Signs    Vitals:   08/11/16 2138 08/12/16 0244 08/12/16 0605 08/12/16 0854  BP: 135/61  126/79   Pulse: 97  89   Resp: 18  20   Temp: 97.6 F (36.4 C)  98 F (36.7 C)   TempSrc: Oral  Oral   SpO2: 95% 96% 97% 98%  Weight:   230 lb 9.6 oz (104.6 kg)   Height:        Intake/Output Summary (Last 24 hours) at 08/12/16 0932 Last data filed at 08/12/16 0800  Gross per 24 hour  Intake              880 ml  Output             2025 ml  Net            -1145 ml   Filed Weights   08/10/16 1006 08/11/16 0515 08/12/16 0605  Weight: 234 lb (106.1 kg) 231 lb 0.7 oz (104.8 kg) 230 lb 9.6 oz (104.6 kg)    Physical Exam    GEN: Well nourished, well developed, in  no acute distress.  Neck: Supple, no JVD, carotid bruits, or masses. Cardiac: RRR, no rubs, or gallops. No clubbing, cyanosis, trace edema.  Radials/DP/PT 2+ and equal bilaterally.  Respiratory:  Respirations  regular and mildly labored, scattered wheezing GI: Soft, nontender, nondistended, BS + x 4. Neuro:  Strength and sensation are intact.   Labs    CBC  Recent Labs  08/10/16 1022 08/11/16 0413  WBC 5.9 7.5  NEUTROABS 4.0  --   HGB 11.4* 11.2*  HCT 34.4* 33.8*  MCV 80.4 80.7  PLT 212 123456   Basic Metabolic Panel  Recent Labs  08/10/16 1022 08/11/16 0413  NA 137 137  K 3.7 4.2  CL 104 102  CO2 25 27  GLUCOSE 242* 361*  BUN 15 18  CREATININE 1.02* 1.15*  CALCIUM 9.6 9.6   Liver Function Tests No results for input(s): AST, ALT, ALKPHOS, BILITOT, PROT, ALBUMIN in the last 72 hours. No results for input(s): LIPASE, AMYLASE in the last 72 hours. Cardiac Enzymes  Recent Labs  08/11/16 1040 08/11/16 1622 08/11/16 2214  TROPONINI <0.03 <0.03 <0.03   BNP Invalid input(s): POCBNP D-Dimer No results for input(s): DDIMER in the last 72 hours. Hemoglobin A1C No results for input(s): HGBA1C in the last 72 hours. Fasting Lipid Panel No results for input(s): CHOL, HDL, LDLCALC, TRIG, CHOLHDL, LDLDIRECT in the last 72 hours. Thyroid Function Tests No results for input(s): TSH, T4TOTAL, T3FREE, THYROIDAB in the last 72 hours.  Invalid input(s): [place]    ECG    NA  Radiology    Dg Chest 2 View  Result Date: 08/10/2016 CLINICAL DATA:  Shortness of breath. EXAM: CHEST  2 VIEW COMPARISON:  07/19/2016. FINDINGS: Mediastinum hilar structures are normal.  Cardiomegaly with normal pulmonary vascularity. Low lung volumes with mild bibasilar atelectasis and/or infiltrates. No pleural effusion or pneumothorax. Multiple left rib fractures are noted. These appear old. IMPRESSION: 1. Low lung volumes with mild bibasilar atelectasis and/or infiltrates. 2.  Stable mild cardiomegaly.  No pulmonary venous congestion. Electronically Signed   By: Marcello Moores  Register   On: 08/10/2016 11:17   Dg Chest 2 View  Result Date: 07/19/2016 CLINICAL DATA:  Worsening shortness of breath, history CHF, COPD, non compliant with diuretics, type II diabetes mellitus, former smoker, hypertension EXAM: CHEST  2 VIEW COMPARISON:  03/22/2016 FINDINGS: Enlargement of cardiac silhouette with pulmonary vascular congestion. Atherosclerotic calcification aorta. Minimal RIGHT basilar atelectasis. Lungs otherwise clear. No pleural effusion or pneumothorax. Deformities at old LEFT posterior LEFT rib fractures. No acute osseous findings. IMPRESSION: Enlargement of cardiac silhouette with pulmonary vascular congestion. Minimal RIGHT basilar atelectasis. Aortic atherosclerosis. Electronically Signed   By: Lavonia Dana M.D.   On: 07/19/2016 19:12   US Breast Ltd Uni Right Inc Axilla  Result Date: 08/07/2016 CLINICAL DATA:  60 year old female who initially presented for evaluation of an enlarging right breast mass identified on CT. She was unable to stay for her ultrasound at that time, and presents today for that exam. EXAM: ULTRASOUND OF THE RIGHT BREAST COMPARISON:  Previous exam(s). FINDINGS: There is a circumscribed oval anechoic mass at 6 o'clock, 6 cm from the nipple with increased posterior acoustic enhancement measuring 1.6 cm, consistent with a benign cyst. IMPRESSION: The right breast mass is consistent with a benign cyst. RECOMMENDATION: Screening mammogram in one year.(Code:[id number]) I have discussed the findings and recommendations with the patient. Results were also provided in writing at the  conclusion of the visit. If applicable, a reminder letter will be sent to the patient regarding the next appointment. BI-RADS CATEGORY  2: Benign Finding(s) Electronically Signed   By: Ammie Ferrier M.D.   On: 08/07/2016 13:40   Mm Diag Breast Tomo Bilateral  Result Date: 07/19/2016 CLINICAL DATA:  60 year old female referred for an enlarging right breast nodule seen on recent abdominal CT dated 06/13/2016. EXAM: 2D DIGITAL DIAGNOSTIC BILATERAL MAMMOGRAM WITH CAD AND ADJUNCT TOMO COMPARISON:  Previous exam(s). ACR Breast Density Category b: There are scattered areas of fibroglandular density. FINDINGS: There is a 19 mm oval, circumscribed mass  within the right breast at 6 o'clock, corresponding to the mass seen on recent CT within the right breast. No suspicious mass, calcifications, or other abnormality is identified within the left breast. Mammographic images were processed with CAD. IMPRESSION: Incomplete evaluation. RECOMMENDATION: A targeted right breast ultrasound is needed for further evaluation of the right breast mass seen mammographically. An order was not available for the ultrasound to be performed today. The patient noted that she was short of breath during her time at the [facility] and stated that she thought she needed to be seen in the emergency room. Transport was called and the patient was transported to the emergency room. I have discussed the findings and recommendations with the patient. Results were also provided in writing at the conclusion of the visit. If applicable, a reminder letter will be sent to the patient regarding the next appointment. BI-RADS CATEGORY  0: Incomplete. Need additional imaging evaluation and/or prior mammograms for comparison. Electronically Signed   By: Pamelia Hoit M.D.   On: 07/19/2016 18:23    Assessment & Plan    CHEST PAIN:  Enzyme x 3 is negative.  No further cardiac work up.    ACUTE ON CHRONIC DYSPNEA:   Thought to be related to COPD.   BNP was  normal.    Please call with further questions.    Signed, Minus Breeding, MD  08/12/2016, 9:32 AM

## 2016-08-12 NOTE — Progress Notes (Signed)
Pt. Refused cpap. Pt. States she does not tolerate the cpap. She is used to wearing the nasal pillows at home.

## 2016-08-12 NOTE — Progress Notes (Signed)
PROGRESS NOTE    Stephanie Schultz  [id number] DOB: 04/05/1956 DOA: 08/10/2016 PCP: Barbette Merino, MD   Brief Narrative:  60 y.o. female with medical history significant of  COPD on home oxygen, diastolic CHF, diabetes, hypertension, chronic dyspnea with exertion presents to the emergency department from Dr. Theodosia Blender office chief complaint of worsening dyspnea on exertion. Initial evaluation reveals acute on chronic respiratory failure likely related to COPD exacerbation.  Assessment & Plan:   Principal Problem:   Acute on chronic respiratory failure (HCC) - Continue supplemental oxygen - Secondary to COPD exacerbation  Active Problems:   COPD (chronic obstructive pulmonary disease) (HCC) - continue bronchodilators, spiriva, levaquin - Will add solumedrol    Hypertension - continue amlodipine    Obstructive sleep apnea - stable    GERD (gastroesophageal reflux disease) - stable on protonix    Diastolic dysfunction   Tobacco abuse   Morbid obesity ([place])    DM type 2 (diabetes mellitus, type 2) ([place]) - continue SSI    Anxiety - continue xanax prn, stable   DVT prophylaxis: Lovenox Code Status: Full Family Communication: None at bedside Disposition Plan:  Pending improvement in respiratory status   Consultants:   None   Procedures: None   Antimicrobials: Levaquin   Subjective: Pt has no new complaints  Objective: Vitals:   08/12/16 0605 08/12/16 0854 08/12/16 1222 08/12/16 1453  BP: 126/79  127/73   Pulse: 89  87   Resp: 20  20   Temp: 98 F (36.7 C)  98.2 F (36.8 C)   TempSrc: Oral  Oral   SpO2: 97% 98% 94% 94%  Weight: 104.6 kg (230 lb 9.6 oz)     Height:        Intake/Output Summary (Last 24 hours) at 08/12/16 1509 Last data filed at 08/12/16 1348  Gross per 24 hour  Intake              940 ml  Output             1325 ml  Net             -385 ml   Filed Weights   08/10/16 1006 08/11/16 0515 08/12/16 0605  Weight: 106.1 kg (234 lb)  104.8 kg (231 lb 0.7 oz) 104.6 kg (230 lb 9.6 oz)    Examination:  General exam: Appears calm and comfortable, in nad. Respiratory system: poor inspiratory effort, equal chest rise, mild exp wheeze Cardiovascular system: S1 & S2 heard, RRR. No JVD, murmurs, rubs, gallops or clicks.  Gastrointestinal system: Abdomen is nondistended, soft and nontender. No organomegaly or masses felt. Normal bowel sounds heard. Obese, difficult exam. Central nervous system: Alert and oriented. No focal neurological deficits. Extremities: Symmetric 5 x 5 power. Skin: No rashes, lesions or ulcers Psychiatry: Judgement and insight appear normal. Mood & affect appropriate.   Data Reviewed: I have personally reviewed following labs and imaging studies  CBC:  Recent Labs Lab 08/10/16 1022 08/11/16 0413  WBC 5.9 7.5  NEUTROABS 4.0  --   HGB 11.4* 11.2*  HCT 34.4* 33.8*  MCV 80.4 80.7  PLT 212 123456   Basic Metabolic Panel:  Recent Labs Lab 08/10/16 1022 08/11/16 0413  NA 137 137  K 3.7 4.2  CL 104 102  CO2 25 27  GLUCOSE 242* 361*  BUN 15 18  CREATININE 1.02* 1.15*  CALCIUM 9.6 9.6   GFR: Estimated Creatinine Clearance: 64.8 mL/min (by C-G formula based on SCr of 1.15 mg/dL (H)). Liver  Function Tests: No results for input(s): AST, ALT, ALKPHOS, BILITOT, PROT, ALBUMIN in the last 168 hours. No results for input(s): LIPASE, AMYLASE in the last 168 hours. No results for input(s): AMMONIA in the last 168 hours. Coagulation Profile: No results for input(s): INR, PROTIME in the last 168 hours. Cardiac Enzymes:  Recent Labs Lab 08/11/16 1040 08/11/16 1622 08/11/16 2214  TROPONINI <0.03 <0.03 <0.03   BNP (last 3 results) No results for input(s): PROBNP in the last 8760 hours. HbA1C: No results for input(s): HGBA1C in the last 72 hours. CBG:  Recent Labs Lab 08/11/16 1344 08/11/16 1632 08/11/16 2135 08/12/16 0731 08/12/16 1150  GLUCAP 447* 404* 264* 175* 180*   Lipid  Profile: No results for input(s): CHOL, HDL, LDLCALC, TRIG, CHOLHDL, LDLDIRECT in the last 72 hours. Thyroid Function Tests: No results for input(s): TSH, T4TOTAL, FREET4, T3FREE, THYROIDAB in the last 72 hours. Anemia Panel: No results for input(s): VITAMINB12, FOLATE, FERRITIN, TIBC, IRON, RETICCTPCT in the last 72 hours. Sepsis Labs: No results for input(s): PROCALCITON, LATICACIDVEN in the last 168 hours.  No results found for this or any previous visit (from the past 240 hour(s)).       Radiology Studies: No results found.      Scheduled Meds: . albuterol  2.5 mg Nebulization Q6H  . allopurinol  100 mg Oral Daily  . amLODipine  2.5 mg Oral Daily  . enoxaparin (LOVENOX) injection  40 mg Subcutaneous Q24H  . fluticasone  1 spray Each Nare Daily  . furosemide  20 mg Oral Daily  . guaiFENesin  600 mg Oral BID  . insulin aspart  0-20 Units Subcutaneous TID WC  . insulin aspart  0-5 Units Subcutaneous QHS  . insulin glargine  10 Units Subcutaneous Q24H  . levofloxacin (LEVAQUIN) IV  500 mg Intravenous Q24H  . linaclotide  145 mcg Oral Daily  . pantoprazole  40 mg Oral Daily  . pravastatin  20 mg Oral Daily  . pregabalin  75 mg Oral BID  . sodium chloride flush  3 mL Intravenous Q12H  . sodium chloride flush  3 mL Intravenous Q12H  . tiotropium  18 mcg Inhalation Daily  . zolpidem  5 mg Oral QHS   Continuous Infusions:    LOS: 2 days   Time spent: > 35 minutes  Velvet Bathe, MD [facility] Pager [phone]  If 7PM-7AM, please contact night-coverage [email or web address] Password [email or web address] 08/12/2016, 3:09 PM

## 2016-08-13 DIAGNOSIS — K219 Gastro-esophageal reflux disease without esophagitis: Secondary | ICD-10-CM

## 2016-08-13 DIAGNOSIS — J441 Chronic obstructive pulmonary disease with (acute) exacerbation: Principal | ICD-10-CM

## 2016-08-13 LAB — GLUCOSE, CAPILLARY
GLUCOSE-CAPILLARY: 507 mg/dL — AB (ref 65–99)
GLUCOSE-CAPILLARY: 291 mg/dL — AB (ref 65–99)
Glucose-Capillary: 206 mg/dL — ABNORMAL HIGH (ref 65–99)
Glucose-Capillary: 327 mg/dL — ABNORMAL HIGH (ref 65–99)
Glucose-Capillary: 304 mg/dL — ABNORMAL HIGH (ref 65–99)

## 2016-08-13 MED ORDER — LINACLOTIDE 145 MCG PO CAPS
145.0000 ug | ORAL_CAPSULE | Freq: Every day | ORAL | Status: DC
  Administered 2016-08-14 – 2016-08-16 (×3): 145 ug via ORAL
  Filled 2016-08-13 (×3): qty 1

## 2016-08-13 NOTE — Progress Notes (Signed)
   08/13/16 1600  Clinical Encounter Type  Visit Type Initial  Referral From Other (Comment) (Spiritual Care Consult)  Spiritual Encounters  Spiritual Needs Prayer;Emotional   Introduced chaplaincy services   -Prayed w/ pt.   -Reflected pt. goals   - Greater independence from oxygen    - Speedy recovery    - Closer relationship w/ God (christian)  Family is a Chief Financial Officer for pt.

## 2016-08-13 NOTE — Evaluation (Signed)
Occupational Therapy Evaluation and Discharge Patient Details Name: Stephanie Schultz MRN: [id number] DOB: [birth year] Today's Date: 08/13/2016    History of Present Illness 60 y.o. female admitted for SOB, with a hx of DM2, HTN, COPD on chronic O2, OSA on CPAP, prior tobacco abuse.   Clinical Impression   This 60 yo female admitted with above presents to acute OT with all education completed and AE issued. We will D/C from acute OT.    Follow Up Recommendations  No OT follow up    Equipment Recommendations  None recommended by OT       Precautions / Restrictions Precautions Precaution Comments: DOE on home O2 Restrictions Weight Bearing Restrictions: No      Mobility Bed Mobility               General bed mobility comments: pt up at sink sitting in chair and had just finished up getting washed up  Transfers Overall transfer level: Independent               General transfer comment: Supplemental O2    Balance Overall balance assessment: No apparent balance deficits (not formally assessed)                                          ADL Overall ADL's : Modified independent                                       General ADL Comments: Did need VCs to purse lip breath when DOE increased. Pt aware that she should not use really hot/steamy water for showering since it will affect her breathing. She reports she uses the electric carts when shopping.  Issued pt a reacher and sock aid               Pertinent Vitals/Pain Pain Assessment: No/denies pain     Hand Dominance Right   Extremity/Trunk Assessment Upper Extremity Assessment Upper Extremity Assessment: Overall WFL for tasks assessed           Communication Communication Communication: No difficulties   Cognition Arousal/Alertness: Awake/alert Behavior During Therapy: WFL for tasks assessed/performed Overall Cognitive Status: Within Functional Limits for tasks  assessed                                Home Living Family/patient expects to be discharged to:: Private residence Living Arrangements: Alone Available Help at Discharge: Personal care attendant Type of Home: Apartment Home Access: Level entry     Home Layout: One level     Bathroom Shower/Tub: Walk-in shower         Home Equipment: Environmental consultant - 4 wheels;Cane - single point;Electric scooter;Hand held shower head;Grab bars - tub/shower;Shower seat;Bedside commode   Additional Comments: Uses O2 at home      Prior Functioning/Environment Level of Independence: Needs assistance    ADL's / Homemaking Assistance Needed: Has a HHAid 7 days a week 2-3 hours/day   Comments: uses assistive device only when needed, chronic oxygen 2.5 -3L at baseline              OT Goals(Current goals can be found in the care plan section) Acute Rehab OT Goals Patient Stated Goal: to go home  OT  Frequency:                End of Session Equipment Utilized During Treatment: Oxygen  Activity Tolerance: Patient tolerated treatment well Patient left:  (sitting on BSC--which she had gotten herself to)   Time: 1342-1400 OT Time Calculation (min): 18 min Charges:  OT General Charges $OT Visit: 1 Procedure OT Evaluation $OT Eval Moderate Complexity: 1 Procedure   Almon Register [id number] 08/13/2016, 2:07 PM

## 2016-08-13 NOTE — Progress Notes (Signed)
PROGRESS NOTE    Stephanie Schultz  [id number] DOB: 01-Aug-1956 DOA: 08/10/2016 PCP: Barbette Merino, MD   Brief Narrative:  60 y.o. female with medical history significant of  COPD on home oxygen, diastolic CHF, diabetes, hypertension, chronic dyspnea with exertion presents to the emergency department from Dr. Theodosia Blender office chief complaint of worsening dyspnea on exertion. Initial evaluation reveals acute on chronic respiratory failure likely related to COPD exacerbation.  Assessment & Plan:   Principal Problem:   Acute on chronic respiratory failure (HCC) - Continue supplemental oxygen - Secondary to COPD exacerbation  Active Problems:   COPD (chronic obstructive pulmonary disease) (HCC) - continue bronchodilators, spiriva, levaquin - Will continue solumedrol    Hypertension - continue amlodipine    Obstructive sleep apnea - stable    GERD (gastroesophageal reflux disease) - stable on protonix    Diastolic dysfunction   Tobacco abuse   Morbid obesity ([place])    DM type 2 (diabetes mellitus, type 2) ([place]) - continue SSI    Anxiety - continue xanax prn, stable   DVT prophylaxis: Lovenox Code Status: Full Family Communication: None at bedside Disposition Plan:  Pending improvement in respiratory status   Consultants:   None   Procedures: None   Antimicrobials: Levaquin   Subjective: Pt states her breathing is better but not at baseline  Objective: Vitals:   08/13/16 0838 08/13/16 0912 08/13/16 0949 08/13/16 1259  BP:   132/62 130/67  Pulse:   86 94  Resp:    18  Temp:    98.1 F (36.7 C)  TempSrc:    Oral  SpO2: 98% 98%  95%  Weight:      Height:        Intake/Output Summary (Last 24 hours) at 08/13/16 1414 Last data filed at 08/13/16 1000  Gross per 24 hour  Intake             1040 ml  Output              950 ml  Net               90 ml   Filed Weights   08/11/16 0515 08/12/16 0605 08/13/16 0506  Weight: 104.8 kg (231 lb 0.7 oz) 104.6 kg  (230 lb 9.6 oz) 104.5 kg (230 lb 6.4 oz)    Examination:  General exam: Appears calm and comfortable, in nad. Respiratory system: equal chest rise, chest still tight but passing air better than yesterday, no rhales Cardiovascular system: S1 & S2 heard, RRR. No JVD, murmurs, rubs, gallops or clicks.  Gastrointestinal system: Abdomen is nondistended, soft and nontender. No organomegaly or masses felt. Normal bowel sounds heard. Obese, difficult exam. Central nervous system: Alert and oriented. No focal neurological deficits. Extremities: Symmetric 5 x 5 power. Skin: No rashes, lesions or ulcers Psychiatry: Judgement and insight appear normal. Mood & affect appropriate.   Data Reviewed: I have personally reviewed following labs and imaging studies  CBC:  Recent Labs Lab 08/10/16 1022 08/11/16 0413  WBC 5.9 7.5  NEUTROABS 4.0  --   HGB 11.4* 11.2*  HCT 34.4* 33.8*  MCV 80.4 80.7  PLT 212 123456   Basic Metabolic Panel:  Recent Labs Lab 08/10/16 1022 08/11/16 0413  NA 137 137  K 3.7 4.2  CL 104 102  CO2 25 27  GLUCOSE 242* 361*  BUN 15 18  CREATININE 1.02* 1.15*  CALCIUM 9.6 9.6   GFR: Estimated Creatinine Clearance: 64.8 mL/min (by C-G formula based on  SCr of 1.15 mg/dL (H)). Liver Function Tests: No results for input(s): AST, ALT, ALKPHOS, BILITOT, PROT, ALBUMIN in the last 168 hours. No results for input(s): LIPASE, AMYLASE in the last 168 hours. No results for input(s): AMMONIA in the last 168 hours. Coagulation Profile: No results for input(s): INR, PROTIME in the last 168 hours. Cardiac Enzymes:  Recent Labs Lab 08/11/16 1040 08/11/16 1622 08/11/16 2214  TROPONINI <0.03 <0.03 <0.03   BNP (last 3 results) No results for input(s): PROBNP in the last 8760 hours. HbA1C:  Recent Labs  08/10/16 2030  HGBA1C 8.2*   CBG:  Recent Labs Lab 08/12/16 1150 08/12/16 1623 08/12/16 2055 08/13/16 0610 08/13/16 1232  GLUCAP 180* 301* 305* 206* 291*   Lipid  Profile: No results for input(s): CHOL, HDL, LDLCALC, TRIG, CHOLHDL, LDLDIRECT in the last 72 hours. Thyroid Function Tests: No results for input(s): TSH, T4TOTAL, FREET4, T3FREE, THYROIDAB in the last 72 hours. Anemia Panel: No results for input(s): VITAMINB12, FOLATE, FERRITIN, TIBC, IRON, RETICCTPCT in the last 72 hours. Sepsis Labs: No results for input(s): PROCALCITON, LATICACIDVEN in the last 168 hours.  No results found for this or any previous visit (from the past 240 hour(s)).       Radiology Studies: No results found.      Scheduled Meds: . albuterol  2.5 mg Nebulization QID  . allopurinol  100 mg Oral Daily  . amLODipine  2.5 mg Oral Daily  . enoxaparin (LOVENOX) injection  40 mg Subcutaneous Q24H  . fluticasone  1 spray Each Nare Daily  . furosemide  20 mg Oral Daily  . guaiFENesin  600 mg Oral BID  . insulin aspart  0-20 Units Subcutaneous TID WC  . insulin aspart  0-5 Units Subcutaneous QHS  . insulin glargine  10 Units Subcutaneous Q24H  . levofloxacin (LEVAQUIN) IV  500 mg Intravenous Q24H  . linaclotide  145 mcg Oral Daily  . methylPREDNISolone (SOLU-MEDROL) injection  40 mg Intravenous Daily  . pantoprazole  40 mg Oral Daily  . pravastatin  20 mg Oral Daily  . pregabalin  75 mg Oral BID  . sodium chloride flush  3 mL Intravenous Q12H  . sodium chloride flush  3 mL Intravenous Q12H  . tiotropium  18 mcg Inhalation Daily  . zolpidem  5 mg Oral QHS   Continuous Infusions:    LOS: 3 days   Time spent: > 35 minutes  Velvet Bathe, MD [facility] Pager [phone]  If 7PM-7AM, please contact night-coverage [email or web address] Password [email or web address] 08/13/2016, 2:14 PM

## 2016-08-13 NOTE — Progress Notes (Signed)
Inpatient Diabetes Program Recommendations  AACE/ADA: New Consensus Statement on Inpatient Glycemic Control (2015)  Target Ranges:  Prepandial:   less than 140 mg/dL      Peak postprandial:   less than 180 mg/dL (1-2 hours)      Critically ill patients:  140 - 180 mg/dL   Results for Stephanie Schultz, Stephanie Schultz (MRN [id number]) as of 08/13/2016 07:56  Ref. Range 08/12/2016 07:31 08/12/2016 11:50 08/12/2016 16:23 08/12/2016 20:55  Glucose-Capillary Latest Ref Range: 65 - 99 mg/dL 175 (H) 180 (H) 301 (H) 305 (H)   Results for Stephanie Schultz, Stephanie Schultz (MRN [id number]) as of 08/13/2016 07:56  Ref. Range 08/13/2016 06:10  Glucose-Capillary Latest Ref Range: 65 - 99 mg/dL 206 (H)    Admit with: SOB  History: DM, CHF, COPD  Home DM Meds: Glipizide 10 mg bid       Januvia 100 mg daily       Actos 30 mg daily  Current Insulin Orders: Lantus 10 units daily      Novolog Resistant Correction Scale/ SSI (0-20 units) TID AC + HS      -Patient currently getting IV Solumedrol 40 mg daily.  -Having fasting and postprandial glucose elevations.     MD- Please consider the following in-hospital insulin adjustments while patient getting IV Steroids:  1. Increase Lantus 15 units daily  2. Start Novolog Meal Coverage: Novolog 4 units tid with meals (hold if pt eats <50% of meal)     --Will follow patient during hospitalization--  Wyn Quaker RN, MSN, CDE Diabetes Coordinator Inpatient Glycemic Control Team Team Pager: [phone] (8a-5p)

## 2016-08-14 LAB — GLUCOSE, CAPILLARY
GLUCOSE-CAPILLARY: 154 mg/dL — AB (ref 65–99)
GLUCOSE-CAPILLARY: 149 mg/dL — AB (ref 65–99)
GLUCOSE-CAPILLARY: 203 mg/dL — AB (ref 65–99)
GLUCOSE-CAPILLARY: 342 mg/dL — AB (ref 65–99)
Glucose-Capillary: 294 mg/dL — ABNORMAL HIGH (ref 65–99)

## 2016-08-14 NOTE — Progress Notes (Signed)
Inpatient Diabetes Program Recommendations  AACE/ADA: New Consensus Statement on Inpatient Glycemic Control (2015)  Target Ranges:  Prepandial:   less than 140 mg/dL      Peak postprandial:   less than 180 mg/dL (1-2 hours)      Critically ill patients:  140 - 180 mg/dL   Results for Stephanie Schultz, Stephanie Schultz (MRN [id number]) as of 08/14/2016 07:43  Ref. Range 08/13/2016 06:10 08/13/2016 12:32 08/13/2016 16:22 08/13/2016 21:01  Glucose-Capillary Latest Ref Range: 65 - 99 mg/dL 206 (H) 291 (H) 327 (H) 304 (H)    Home DM Meds: Glipizide 10 mg bid                             Januvia 100 mg daily                             Actos 30 mg daily  Current Insulin Orders: Lantus 10 units daily                                       Novolog Resistant Correction Scale/ SSI (0-20 units) TID AC + HS      -Patient currently getting IV Solumedrol 40 mg daily.  -Having fasting and postprandial glucose elevations.     MD- Please consider the following in-hospital insulin adjustments while patient getting IV Steroids:  1. Increase Lantus 12 units daily  2. Start Novolog Meal Coverage: Novolog 4 units tid with meals (hold if pt eats <50% of meal)    --Will follow patient during hospitalization--  Wyn Quaker RN, MSN, CDE Diabetes Coordinator Inpatient Glycemic Control Team Team Pager: [phone] (8a-5p)

## 2016-08-14 NOTE — Progress Notes (Signed)
PROGRESS NOTE    Stephanie Schultz  [id number] DOB: [birth year] DOA: 08/10/2016 PCP: Barbette Merino, MD   Brief Narrative:  60 y.o. female with medical history significant of  COPD on home oxygen, diastolic CHF, diabetes, hypertension, chronic dyspnea with exertion presents to the emergency department from Dr. Theodosia Blender office chief complaint of worsening dyspnea on exertion. Initial evaluation reveals acute on chronic respiratory failure likely related to COPD exacerbation.  Assessment & Plan:   Principal Problem:   Acute on chronic respiratory failure (HCC) - Continue supplemental oxygen - Secondary to COPD exacerbation  Active Problems:   COPD (chronic obstructive pulmonary disease) (HCC) - continue bronchodilators, spiriva, levaquin - improving on solumedrol    Hypertension - continue amlodipine    Obstructive sleep apnea - stable    GERD (gastroesophageal reflux disease) - stable on protonix    Diastolic dysfunction   Tobacco abuse   Morbid obesity ([place])    DM type 2 (diabetes mellitus, type 2) ([place]) - continue SSI    Anxiety - continue xanax prn, stable   DVT prophylaxis: Lovenox Code Status: Full Family Communication: None at bedside Disposition Plan:  D/c most likely in the next 1-2 days   Consultants:   None   Procedures: None   Antimicrobials: Levaquin   Subjective: Pt reports feeling better.  Objective: Vitals:   08/14/16 0629 08/14/16 0946 08/14/16 1150 08/14/16 1307  BP: 138/87 (!) 121/52 131/82   Pulse: 74  84   Resp: 20  20   Temp: 97.8 F (36.6 C)  98.6 F (37 C)   TempSrc: Oral  Oral   SpO2: 100%  92% 96%  Weight: 104.3 kg (230 lb)     Height:        Intake/Output Summary (Last 24 hours) at 08/14/16 1453 Last data filed at 08/14/16 1345  Gross per 24 hour  Intake              845 ml  Output              950 ml  Net             -105 ml   Filed Weights   08/12/16 0605 08/13/16 0506 08/14/16 0629  Weight: 104.6 kg (230 lb  9.6 oz) 104.5 kg (230 lb 6.4 oz) 104.3 kg (230 lb)    Examination:  General exam: Appears calm and comfortable, in nad. Respiratory system: equal chest rise, chest still tight but passing air better than yesterday, no rhales Cardiovascular system: S1 & S2 heard, RRR. No JVD, murmurs, rubs, gallops or clicks.  Gastrointestinal system: Abdomen is nondistended, soft and nontender. No organomegaly or masses felt. Normal bowel sounds heard. Obese, difficult exam. Central nervous system: Alert and oriented. No focal neurological deficits. Extremities: Symmetric 5 x 5 power. Skin: No rashes, lesions or ulcers Psychiatry: Judgement and insight appear normal. Mood & affect appropriate.   Data Reviewed: I have personally reviewed following labs and imaging studies  CBC:  Recent Labs Lab 08/10/16 1022 08/11/16 0413  WBC 5.9 7.5  NEUTROABS 4.0  --   HGB 11.4* 11.2*  HCT 34.4* 33.8*  MCV 80.4 80.7  PLT 212 123456   Basic Metabolic Panel:  Recent Labs Lab 08/10/16 1022 08/11/16 0413  NA 137 137  K 3.7 4.2  CL 104 102  CO2 25 27  GLUCOSE 242* 361*  BUN 15 18  CREATININE 1.02* 1.15*  CALCIUM 9.6 9.6   GFR: Estimated Creatinine Clearance: 64.7 mL/min (by C-G formula based  on SCr of 1.15 mg/dL (H)). Liver Function Tests: No results for input(s): AST, ALT, ALKPHOS, BILITOT, PROT, ALBUMIN in the last 168 hours. No results for input(s): LIPASE, AMYLASE in the last 168 hours. No results for input(s): AMMONIA in the last 168 hours. Coagulation Profile: No results for input(s): INR, PROTIME in the last 168 hours. Cardiac Enzymes:  Recent Labs Lab 08/11/16 1040 08/11/16 1622 08/11/16 2214  TROPONINI <0.03 <0.03 <0.03   BNP (last 3 results) No results for input(s): PROBNP in the last 8760 hours. HbA1C: No results for input(s): HGBA1C in the last 72 hours. CBG:  Recent Labs Lab 08/13/16 1622 08/13/16 2101 08/14/16 0626 08/14/16 0944 08/14/16 1148  GLUCAP 327* 304* 154*  149* 203*   Lipid Profile: No results for input(s): CHOL, HDL, LDLCALC, TRIG, CHOLHDL, LDLDIRECT in the last 72 hours. Thyroid Function Tests: No results for input(s): TSH, T4TOTAL, FREET4, T3FREE, THYROIDAB in the last 72 hours. Anemia Panel: No results for input(s): VITAMINB12, FOLATE, FERRITIN, TIBC, IRON, RETICCTPCT in the last 72 hours. Sepsis Labs: No results for input(s): PROCALCITON, LATICACIDVEN in the last 168 hours.  No results found for this or any previous visit (from the past 240 hour(s)).       Radiology Studies: No results found.      Scheduled Meds: . albuterol  2.5 mg Nebulization QID  . allopurinol  100 mg Oral Daily  . amLODipine  2.5 mg Oral Daily  . enoxaparin (LOVENOX) injection  40 mg Subcutaneous Q24H  . fluticasone  1 spray Each Nare Daily  . furosemide  20 mg Oral Daily  . guaiFENesin  600 mg Oral BID  . insulin aspart  0-20 Units Subcutaneous TID WC  . insulin aspart  0-5 Units Subcutaneous QHS  . insulin glargine  10 Units Subcutaneous Q24H  . levofloxacin (LEVAQUIN) IV  500 mg Intravenous Q24H  . linaclotide  145 mcg Oral QAC breakfast  . methylPREDNISolone (SOLU-MEDROL) injection  40 mg Intravenous Daily  . pantoprazole  40 mg Oral Daily  . pravastatin  20 mg Oral Daily  . pregabalin  75 mg Oral BID  . sodium chloride flush  3 mL Intravenous Q12H  . sodium chloride flush  3 mL Intravenous Q12H  . tiotropium  18 mcg Inhalation Daily  . zolpidem  5 mg Oral QHS   Continuous Infusions:    LOS: 4 days   Time spent: > 35 minutes  Velvet Bathe, MD [facility] Pager [phone]  If 7PM-7AM, please contact night-coverage [email or web address] Password [email or web address] 08/14/2016, 2:53 PM

## 2016-08-15 DIAGNOSIS — I10 Essential (primary) hypertension: Secondary | ICD-10-CM

## 2016-08-15 DIAGNOSIS — G4733 Obstructive sleep apnea (adult) (pediatric): Secondary | ICD-10-CM

## 2016-08-15 DIAGNOSIS — E119 Type 2 diabetes mellitus without complications: Secondary | ICD-10-CM

## 2016-08-15 LAB — GLUCOSE, CAPILLARY
GLUCOSE-CAPILLARY: 212 mg/dL — AB (ref 65–99)
GLUCOSE-CAPILLARY: 293 mg/dL — AB (ref 65–99)
Glucose-Capillary: 113 mg/dL — ABNORMAL HIGH (ref 65–99)
Glucose-Capillary: 249 mg/dL — ABNORMAL HIGH (ref 65–99)

## 2016-08-15 MED ORDER — LEVOFLOXACIN 500 MG PO TABS
500.0000 mg | ORAL_TABLET | Freq: Every day | ORAL | Status: DC
  Administered 2016-08-16: 500 mg via ORAL
  Filled 2016-08-15: qty 1

## 2016-08-15 MED ORDER — ALBUTEROL SULFATE (2.5 MG/3ML) 0.083% IN NEBU
2.5000 mg | INHALATION_SOLUTION | Freq: Three times a day (TID) | RESPIRATORY_TRACT | Status: DC
  Administered 2016-08-16: 2.5 mg via RESPIRATORY_TRACT
  Filled 2016-08-15: qty 3

## 2016-08-15 MED ORDER — PREDNISONE 20 MG PO TABS
40.0000 mg | ORAL_TABLET | Freq: Every day | ORAL | Status: DC
  Administered 2016-08-16: 40 mg via ORAL
  Filled 2016-08-15: qty 2

## 2016-08-15 NOTE — Progress Notes (Signed)
Patient refusing CPAP tonight.

## 2016-08-15 NOTE — Progress Notes (Signed)
Pt iv infiltrated during iv levaquin ABX infusion.  New ivs place yesterday and today.  Dr. Quincy Simmonds informed instructed that he will change to po.  Pt made aware.  Karie Kirks, Therapist, sports.

## 2016-08-15 NOTE — Progress Notes (Signed)
PROGRESS NOTE    Stephanie Schultz  [id number] DOB: [birth year] DOA: 08/10/2016 PCP: Barbette Merino, MD   Brief Narrative:  60 y.o. female with medical history significant of  COPD on home oxygen, diastolic CHF, diabetes, hypertension, chronic dyspnea with exertion presents to the emergency department from Dr. Theodosia Blender office chief complaint of worsening dyspnea on exertion. Initial evaluation reveals acute on chronic respiratory failure likely related to COPD exacerbation.  Subjective: Patient seen and examined at bedside this Am. Patient reports sig improvement, no acute events overnight.   Assessment & Plan:  COPD (chronic obstructive pulmonary disease) ([place]) - continue bronchodilators, spiriva, levaquin - Will switch solumedrol to prednisone - need to be discharge on a taper for 12 days. If improved will d/c tomorrow am     Hypertension - continue amlodipine    Obstructive sleep apnea - stable    GERD (gastroesophageal reflux disease) - stable on protonix    DM type 2 (diabetes mellitus, type 2) ([place]) - continue SSI    Anxiety  - continue xanax prn, stable   DVT prophylaxis: Lovenox Code Status: Full Family Communication: None at bedside Disposition Plan:  D/c most likely tomorrow    Consultants:   None   Procedures: None   Antimicrobials: Levaquin   Objective: Vitals:   08/15/16 0542 08/15/16 0914 08/15/16 0951 08/15/16 1213  BP: 124/76  129/65   Pulse: 72     Resp: 18     Temp: 97.9 F (36.6 C)     TempSrc: Oral     SpO2: 96% 93%  97%  Weight: 104.3 kg (229 lb 14.4 oz)     Height:        Intake/Output Summary (Last 24 hours) at 08/15/16 1229 Last data filed at 08/15/16 0928  Gross per 24 hour  Intake              983 ml  Output             1320 ml  Net             -337 ml   Filed Weights   08/13/16 0506 08/14/16 0629 08/15/16 0542  Weight: 104.5 kg (230 lb 6.4 oz) 104.3 kg (230 lb) 104.3 kg (229 lb 14.4 oz)    Examination:  General  exam: Appears calm and comfortable, in nad. Respiratory system: equal chest rise, chest still tight but passing air better than yesterday, no rhales Cardiovascular system: S1 & S2 heard, RRR. No JVD, murmurs, rubs, gallops or clicks. . Central nervous system: Alert and oriented. No focal neurological deficits. Extremities: Symmetric 5 x 5 power. Skin: No rashes, lesions or ulcers Psychiatry: Judgement and insight appear normal. Mood & affect appropriate.   Data Reviewed: I have personally reviewed following labs and imaging studies  CBC:  Recent Labs Lab 08/10/16 1022 08/11/16 0413  WBC 5.9 7.5  NEUTROABS 4.0  --   HGB 11.4* 11.2*  HCT 34.4* 33.8*  MCV 80.4 80.7  PLT 212 123456   Basic Metabolic Panel:  Recent Labs Lab 08/10/16 1022 08/11/16 0413  NA 137 137  K 3.7 4.2  CL 104 102  CO2 25 27  GLUCOSE 242* 361*  BUN 15 18  CREATININE 1.02* 1.15*  CALCIUM 9.6 9.6   GFR: Estimated Creatinine Clearance: 64.7 mL/min (by C-G formula based on SCr of 1.15 mg/dL (H)). Liver Function Tests: No results for input(s): AST, ALT, ALKPHOS, BILITOT, PROT, ALBUMIN in the last 168 hours. No results for input(s): LIPASE, AMYLASE in  the last 168 hours. No results for input(s): AMMONIA in the last 168 hours. Coagulation Profile: No results for input(s): INR, PROTIME in the last 168 hours. Cardiac Enzymes:  Recent Labs Lab 08/11/16 1040 08/11/16 1622 08/11/16 2214  TROPONINI <0.03 <0.03 <0.03   BNP (last 3 results) No results for input(s): PROBNP in the last 8760 hours. HbA1C: No results for input(s): HGBA1C in the last 72 hours. CBG:  Recent Labs Lab 08/14/16 1148 08/14/16 1650 08/14/16 2051 08/15/16 0551 08/15/16 1137  GLUCAP 203* 342* 294* 113* 212*   Lipid Profile: No results for input(s): CHOL, HDL, LDLCALC, TRIG, CHOLHDL, LDLDIRECT in the last 72 hours. Thyroid Function Tests: No results for input(s): TSH, T4TOTAL, FREET4, T3FREE, THYROIDAB in the last 72  hours. Anemia Panel: No results for input(s): VITAMINB12, FOLATE, FERRITIN, TIBC, IRON, RETICCTPCT in the last 72 hours. Sepsis Labs: No results for input(s): PROCALCITON, LATICACIDVEN in the last 168 hours.  No results found for this or any previous visit (from the past 240 hour(s)).    Radiology Studies: No results found.   Scheduled Meds: . albuterol  2.5 mg Nebulization QID  . allopurinol  100 mg Oral Daily  . amLODipine  2.5 mg Oral Daily  . enoxaparin (LOVENOX) injection  40 mg Subcutaneous Q24H  . fluticasone  1 spray Each Nare Daily  . furosemide  20 mg Oral Daily  . guaiFENesin  600 mg Oral BID  . insulin aspart  0-20 Units Subcutaneous TID WC  . insulin aspart  0-5 Units Subcutaneous QHS  . insulin glargine  10 Units Subcutaneous Q24H  . levofloxacin (LEVAQUIN) IV  500 mg Intravenous Q24H  . linaclotide  145 mcg Oral QAC breakfast  . pantoprazole  40 mg Oral Daily  . pravastatin  20 mg Oral Daily  . [START ON 08/16/2016] predniSONE  40 mg Oral Q breakfast  . pregabalin  75 mg Oral BID  . sodium chloride flush  3 mL Intravenous Q12H  . sodium chloride flush  3 mL Intravenous Q12H  . tiotropium  18 mcg Inhalation Daily  . zolpidem  5 mg Oral QHS   Continuous Infusions:    LOS: 5 days    Chipper Oman, MD [facility] Pager [phone]  If 7PM-7AM, please contact night-coverage [email or web address] Password [email or web address] 08/15/2016, 12:29 PM

## 2016-08-16 ENCOUNTER — Ambulatory Visit: Payer: Medicaid Other | Admitting: Cardiology

## 2016-08-16 DIAGNOSIS — Z720 Tobacco use: Secondary | ICD-10-CM

## 2016-08-16 LAB — GLUCOSE, CAPILLARY
Glucose-Capillary: 116 mg/dL — ABNORMAL HIGH (ref 65–99)
Glucose-Capillary: 240 mg/dL — ABNORMAL HIGH (ref 65–99)

## 2016-08-16 MED ORDER — KETOROLAC TROMETHAMINE 15 MG/ML IJ SOLN: 15.0000 mg | Freq: Four times a day (QID) | INTRAMUSCULAR | Status: DC | PRN

## 2016-08-16 MED ORDER — PREDNISONE 10 MG (21) PO TBPK: ORAL_TABLET | ORAL | 0 refills | Status: DC

## 2016-08-16 MED ORDER — LEVOFLOXACIN 500 MG PO TABS: 500.0000 mg | ORAL_TABLET | Freq: Every day | ORAL | 0 refills | Status: AC

## 2016-08-16 NOTE — Discharge Summary (Signed)
Physician Discharge Summary  Stephanie Schultz  [id number]  DOB: 1956-09-05  DOA: 08/10/2016 PCP: Barbette Merino, MD  Admit date: 08/10/2016 Discharge date: 08/16/2016  Admitted From: Home Disposition: Home  Recommendations for Outpatient Follow-up:  1. Follow up with PCP in 1-2 weeks 2. Please obtain BMP/CBC in one week 3. Please follow up on the following pending results:  Home Health: Yes Equipment/Devices: O2   Discharge Condition: Stable CODE STATUS: Full Diet recommendation: Heart Healthy / Carb Modified  Brief/Interim Summary:  Stephanie Schultz is a pleasant 60 y.o. female with medical history significant of  COPD on home oxygen, diastolic CHF, diabetes, hypertension, chronic dyspnea with exertion presents to the emergency department from Dr. Theodosia Blender office chief complaint of worsening dyspnea on exertion. Initial evaluation reveals acute on chronic respiratory failure likely related to COPD exacerbation. In the emergency department oxygen saturation level 83%. Patient was started on IV steroids, antibiotics, and nebulizer. Patient significantly improved, solumedrol was taper down subsequently changed to prednisone. Patient on O2 at baseline, chronic conditions were stable during the whole hospital stay. Patient was discharge home on prednisone taper, and to continue inhalers.   Discharge Diagnoses:   COPD (chronic obstructive pulmonary disease) ([place]) - continue bronchodilators, spiriva, levaquin for 5 more days -Prednisone taper for 21 day, until see pulmonology - may need long term use - follow up with Pulm - follow up with PMF    Hypertension stable  - continue amlodipine    Obstructive sleep apnea - stable    GERD (gastroesophageal reflux disease) - stable on protonix    DM type 2 (diabetes mellitus, type 2) (H[facility]) - continue SSI    Anxiety  - continue xanax prn, stable  Discharge Instructions  Discharge Instructions    Call MD for:  difficulty breathing,  headache or visual disturbances    Complete by:  As directed    Call MD for:  extreme fatigue    Complete by:  As directed    Call MD for:  hives    Complete by:  As directed    Call MD for:  persistant dizziness or light-headedness    Complete by:  As directed    Call MD for:  persistant nausea and vomiting    Complete by:  As directed    Call MD for:  redness, tenderness, or signs of infection (pain, swelling, redness, odor or green/yellow discharge around incision site)    Complete by:  As directed    Call MD for:  severe uncontrolled pain    Complete by:  As directed    Call MD for:  temperature >100.4    Complete by:  As directed    Diet - low sodium heart healthy    Complete by:  As directed    Discharge instructions    Complete by:  As directed    Follow up with PMD in 3-5 days  Complete antibiotic treatment as indicated  Pulmonary follow up in 1-2 weeks   Increase activity slowly    Complete by:  As directed        Medication List    STOP taking these medications   guaiFENesin 600 MG 12 hr tablet Commonly known as:  MUCINEX     TAKE these medications   albuterol (2.5 MG/3ML) 0.083% nebulizer solution Commonly known as:  PROVENTIL Take 2.5 mg by nebulization 4 (four) times daily.   PROAIR HFA 108 (90 Base) MCG/ACT inhaler Generic drug:  albuterol INHALE 2 PUFFS BY MOUTH EVERY 4 HOURS  AS NEEDED FOR WHEEZING   allopurinol 100 MG tablet Commonly known as:  ZYLOPRIM Take 100 mg by mouth daily.   ALPRAZolam 1 MG tablet Commonly known as:  XANAX Take 1 mg by mouth 2 (two) times daily as needed for anxiety.   AMITIZA 24 MCG capsule Generic drug:  lubiprostone Take 1 capsule by mouth daily.   amLODipine 2.5 MG tablet Commonly known as:  NORVASC Take 1 tablet (2.5 mg total) by mouth daily.   budesonide-formoterol 160-4.5 MCG/ACT inhaler Commonly known as:  SYMBICORT Inhale 2 puffs into the lungs 2 (two) times daily.   fluticasone 50 MCG/ACT nasal  spray Commonly known as:  FLONASE Place 1 spray into both nostrils daily.   furosemide 20 MG tablet Commonly known as:  LASIX Take 20 mg by mouth daily.   glipiZIDE 10 MG tablet Commonly known as:  GLUCOTROL Take 10 mg by mouth 2 (two) times daily before a meal.   HYDROcodone-acetaminophen 7.5-325 MG tablet Commonly known as:  NORCO Take 1 tablet by mouth every 6 (six) hours as needed for moderate pain.   JANUVIA 100 MG tablet Generic drug:  sitaGLIPtin Take 100 mg by mouth daily.   levofloxacin 500 MG tablet Commonly known as:  LEVAQUIN Take 1 tablet (500 mg total) by mouth daily.   LINZESS 145 MCG Caps capsule Generic drug:  linaclotide Take 145 mcg by mouth daily.   loratadine 10 MG tablet Commonly known as:  CLARITIN Take 10 mg by mouth daily.   nystatin cream Commonly known as:  MYCOSTATIN Apply 1 application topically 2 (two) times daily as needed (yeast infection.).   ondansetron 4 MG tablet Commonly known as:  ZOFRAN Take 1 tablet (4 mg total) by mouth every 6 (six) hours as needed for nausea or vomiting.   OXYGEN Inhale 2.5 L into the lungs continuous.   pantoprazole 40 MG tablet Commonly known as:  PROTONIX Take 1 tablet (40 mg total) by mouth daily at 12 noon.   PAZEO 0.7 % Soln Generic drug:  Olopatadine HCl Place 1 drop into both eyes daily as needed (allergies).   pioglitazone 30 MG tablet Commonly known as:  ACTOS Take 30 mg by mouth daily.   pravastatin 20 MG tablet Commonly known as:  PRAVACHOL Take 20 mg by mouth daily.   predniSONE 10 MG (21) Tbpk tablet Commonly known as:  STERAPRED UNI-PAK 21 TAB Take 4 pills a day for 6 days Take 3 pills a day for 5 days  Take 2 pills a day for 4 days  Take 1 pill a day for 6 days   pregabalin 75 MG capsule Commonly known as:  LYRICA Take 75 mg by mouth 2 (two) times daily.   sodium chloride 0.65 % Soln nasal spray Commonly known as:  OCEAN Place 1 spray into both nostrils as needed for  congestion.   tiotropium 18 MCG inhalation capsule Commonly known as:  SPIRIVA Place 1 capsule (18 mcg total) into inhaler and inhale daily.   triamterene-hydrochlorothiazide 37.5-25 MG capsule Commonly known as:  DYAZIDE Take 1 capsule by mouth daily.   zolpidem 10 MG tablet Commonly known as:  AMBIEN Take 10 mg by mouth at bedtime.      Follow-up Information    GARBA,LAWAL, MD. Schedule an appointment as soon as possible for a visit in 1 week(s).   Specialty:  Internal Medicine Contact information: [address] [place] [address] [id number]        Cherlynn Kaiser, MD. Schedule  an appointment as soon as possible for a visit today.   Specialty:  Pulmonary Disease Contact information: [facility] [address] [id number] [phone]          Allergies  Allergen Reactions  . Acetaminophen Other (See Comments)    Pt was told that she could not take Tylenol, does not know why    Consultations:  Cardiology   Procedures/Studies: Dg Chest 2 View  Result Date: 08/10/2016 CLINICAL DATA:  Shortness of breath. EXAM: CHEST  2 VIEW COMPARISON:  07/19/2016. FINDINGS: Mediastinum hilar structures are normal. Cardiomegaly with normal pulmonary vascularity. Low lung volumes with mild bibasilar atelectasis and/or infiltrates. No pleural effusion or pneumothorax. Multiple left rib fractures are noted. These appear old. IMPRESSION: 1. Low lung volumes with mild bibasilar atelectasis and/or infiltrates. 2.  Stable mild cardiomegaly.  No pulmonary venous congestion. Electronically Signed   By: Marcello Moores  Register   On: 08/10/2016 11:17   Dg Chest 2 View  Result Date: 07/19/2016 CLINICAL DATA:  Worsening shortness of breath, history CHF, COPD, non compliant with diuretics, type II diabetes mellitus, former smoker, hypertension EXAM: CHEST  2 VIEW COMPARISON:  03/22/2016 FINDINGS: Enlargement of cardiac silhouette with pulmonary vascular congestion.  Atherosclerotic calcification aorta. Minimal RIGHT basilar atelectasis. Lungs otherwise clear. No pleural effusion or pneumothorax. Deformities at old LEFT posterior LEFT rib fractures. No acute osseous findings. IMPRESSION: Enlargement of cardiac silhouette with pulmonary vascular congestion. Minimal RIGHT basilar atelectasis. Aortic atherosclerosis. Electronically Signed   By: Lavonia Dana M.D.   On: 07/19/2016 19:12   US Breast Ltd Uni Right Inc Axilla  Result Date: 08/07/2016 CLINICAL DATA:  60 year old female who initially presented for evaluation of an enlarging right breast mass identified on CT. She was unable to stay for her ultrasound at that time, and presents today for that exam. EXAM: ULTRASOUND OF THE RIGHT BREAST COMPARISON:  Previous exam(s). FINDINGS: There is a circumscribed oval anechoic mass at 6 o'clock, 6 cm from the nipple with increased posterior acoustic enhancement measuring 1.6 cm, consistent with a benign cyst. IMPRESSION: The right breast mass is consistent with a benign cyst. RECOMMENDATION: Screening mammogram in one year.(Code:[id number]-[id number]) I have discussed the findings and recommendations with the patient. Results were also provided in writing at the conclusion of the visit. If applicable, a reminder letter will be sent to the patient regarding the next appointment. BI-RADS CATEGORY  2: Benign Finding(s) Electronically Signed   By: Ammie Ferrier M.D.   On: 08/07/2016 13:40   Mm Diag Breast Tomo Bilateral  Result Date: 07/19/2016 CLINICAL DATA:  60 year old female referred for an enlarging right breast nodule seen on recent abdominal CT dated 06/13/2016. EXAM: 2D DIGITAL DIAGNOSTIC BILATERAL MAMMOGRAM WITH CAD AND ADJUNCT TOMO COMPARISON:  Previous exam(s). ACR Breast Density Category b: There are scattered areas of fibroglandular density. FINDINGS: There is a 19 mm oval, circumscribed mass within the right breast at 6 o'clock, corresponding to the mass seen on recent CT within  the right breast. No suspicious mass, calcifications, or other abnormality is identified within the left breast. Mammographic images were processed with CAD. IMPRESSION: Incomplete evaluation. RECOMMENDATION: A targeted right breast ultrasound is needed for further evaluation of the right breast mass seen mammographically. An order was not available for the ultrasound to be performed today. The patient noted that she was short of breath during her time at the [facility] and stated that she thought she needed to be seen in the emergency room. Transport was  called and the patient was transported to the emergency room. I have discussed the findings and recommendations with the patient. Results were also provided in writing at the conclusion of the visit. If applicable, a reminder letter will be sent to the patient regarding the next appointment. BI-RADS CATEGORY  0: Incomplete. Need additional imaging evaluation and/or prior mammograms for comparison. Electronically Signed   By: Pamelia Hoit M.D.   On: 07/19/2016 18:23      Discharge Exam: Vitals:   08/15/16 2026 08/16/16 0446  BP: (!) 146/71 136/71  Pulse: 72 61  Resp: 18 18  Temp: 97.8 F (36.6 C) 97.8 F (36.6 C)   Vitals:   08/15/16 2026 08/16/16 0446 08/16/16 0929 08/16/16 0932  BP: (!) 146/71 136/71    Pulse: 72 61    Resp: 18 18    Temp: 97.8 F (36.6 C) 97.8 F (36.6 C)    TempSrc: Oral Oral    SpO2: 94% 99% 93% 96%  Weight:  104 kg (229 lb 3.2 oz)    Height:        General: Pt is alert, awake, not in acute distress Cardiovascular: RRR, S1/S2 +, no rubs, no gallops Respiratory: CTA bilaterally, no wheezing, no rhonchi Abdominal: Soft, NT, ND, bowel sounds + Extremities: no edema, no cyanosis  The results of significant diagnostics from this hospitalization (including imaging, microbiology, ancillary and laboratory) are listed below for reference.     Microbiology: No results found for this or any previous visit (from the  past 240 hour(s)).   Labs: BNP (last 3 results)  Recent Labs  03/23/16 0343 07/19/16 1757 08/10/16 1022  BNP 8.8 21.5 3.8   Basic Metabolic Panel:  Recent Labs Lab 08/10/16 1022 08/11/16 0413  NA 137 137  K 3.7 4.2  CL 104 102  CO2 25 27  GLUCOSE 242* 361*  BUN 15 18  CREATININE 1.02* 1.15*  CALCIUM 9.6 9.6   Liver Function Tests: No results for input(s): AST, ALT, ALKPHOS, BILITOT, PROT, ALBUMIN in the last 168 hours. No results for input(s): LIPASE, AMYLASE in the last 168 hours. No results for input(s): AMMONIA in the last 168 hours. CBC:  Recent Labs Lab 08/10/16 1022 08/11/16 0413  WBC 5.9 7.5  NEUTROABS 4.0  --   HGB 11.4* 11.2*  HCT 34.4* 33.8*  MCV 80.4 80.7  PLT 212 215   Cardiac Enzymes:  Recent Labs Lab 08/11/16 1040 08/11/16 1622 08/11/16 2214  TROPONINI <0.03 <0.03 <0.03   BNP: Invalid input(s): POCBNP CBG:  Recent Labs Lab 08/15/16 0551 08/15/16 1137 08/15/16 1659 08/15/16 2029 08/16/16 0609  GLUCAP 113* 212* 293* 249* 116*   D-Dimer No results for input(s): DDIMER in the last 72 hours. Hgb A1c No results for input(s): HGBA1C in the last 72 hours. Lipid Profile No results for input(s): CHOL, HDL, LDLCALC, TRIG, CHOLHDL, LDLDIRECT in the last 72 hours. Thyroid function studies No results for input(s): TSH, T4TOTAL, T3FREE, THYROIDAB in the last 72 hours.  Invalid input(s): FREET3 Anemia work up No results for input(s): VITAMINB12, FOLATE, FERRITIN, TIBC, IRON, RETICCTPCT in the last 72 hours. Urinalysis    Component Value Date/Time   COLORURINE YELLOW 12/03/2015 0011   APPEARANCEUR CLEAR 12/03/2015 0011   LABSPEC 1.026 12/03/2015 0011   PHURINE 6.0 12/03/2015 0011   GLUCOSEU >1000 (A) 12/03/2015 0011   HGBUR NEGATIVE 12/03/2015 0011   BILIRUBINUR NEGATIVE 12/03/2015 0011   KETONESUR NEGATIVE 12/03/2015 0011   PROTEINUR NEGATIVE 12/03/2015 0011   UROBILINOGEN 0.2  03/31/2015 1231   NITRITE NEGATIVE 12/03/2015 0011    LEUKOCYTESUR NEGATIVE 12/03/2015 0011   Sepsis Labs Invalid input(s): PROCALCITONIN,  WBC,  LACTICIDVEN Microbiology No results found for this or any previous visit (from the past 240 hour(s)).   SIGNED:  Chipper Oman, MD  [facility] 08/16/2016, 10:04 AM Pager   If 7PM-7AM, please contact night-coverage [email or web address] Password [email or web address]

## 2016-08-16 NOTE — Clinical Social Work Note (Signed)
Patient does not have a ride home today. She is unable to pay for a cab and all of her children live out of town and are unable to pick her up. Patient is unable to use bus with oxygen and the bus stop is reportedly too far from her apartment. Cab voucher filled out and will give to RN.  CSW signing off. Consult again if any social work needs arise.  Dayton Scrape, [place]

## 2016-08-17 ENCOUNTER — Ambulatory Visit: Payer: Medicaid Other | Admitting: Podiatry

## 2016-08-29 ENCOUNTER — Inpatient Hospital Stay: Payer: Medicaid Other | Admitting: Adult Health

## 2016-09-06 ENCOUNTER — Ambulatory Visit: Payer: Medicaid Other | Admitting: Dietician

## 2016-09-21 ENCOUNTER — Ambulatory Visit: Payer: Medicaid Other | Admitting: Dietician

## 2016-11-27 ENCOUNTER — Ambulatory Visit (INDEPENDENT_AMBULATORY_CARE_PROVIDER_SITE_OTHER): Payer: Medicaid Other | Admitting: Cardiology

## 2016-11-27 ENCOUNTER — Encounter: Payer: Self-pay | Admitting: Cardiology

## 2016-11-27 ENCOUNTER — Encounter: Payer: Self-pay | Admitting: *Deleted

## 2016-11-27 VITALS — BP 110/56 | HR 94 | Ht 60.0 in | Wt 222.8 lb

## 2016-11-27 DIAGNOSIS — R079 Chest pain, unspecified: Secondary | ICD-10-CM

## 2016-11-27 NOTE — Patient Instructions (Addendum)
Medication Instructions:   Your physician recommends that you continue on your current medications as directed. Please refer to the Current Medication list given to you today.   If you need a refill on your cardiac medications before your next appointment, please call your pharmacy.  Labwork: RETURN OF LABS ON November 29 2016.Marland Kitchen   Testing/Procedures:  SEE LETTER FOR CATH ON 12/07/16..   Follow-Up: WILL BE DETERMINED AFTER  CATH    Any Other Special Instructions Will Be Listed Below (If Applicable).

## 2016-11-27 NOTE — Progress Notes (Signed)
11/27/2016 Stephanie Schultz   09/05/1956  [id number]  Primary Physician Barbette Merino, MD Primary Cardiologist: Dr. Radford Pax   Reason for Visit/CC: Chest Pain   HPI:  Stephanie Schultz is a 61 y.o. female with a hx of DM2, HTN, COPD on chronic O2, OSA on CPAP, prior tobacco abuse. LHC at [facility] in [place], [place] 09/2008: Normal coronary arteries EF 70%. Echo 12/16/15: Mod LVH, EF 60-65%, normal wall motion, grade 1DD.   She was last seen by Dr. Radford Pax 08/10/2016. She complained of worsening dyspnea. Per office note, she was not felt to be volume overloaded.  It was felt that her symptoms were secondary to acute COPD exacerbation and Dr. Radford Pax had recommended hospital admission. She was sent to [facility] ED where she was admitted by IM and treated for COPD.  It was also outlined in Dr. Theodosia Blender note that she had also had issues with chest discomfort, however due to her weight/ body habitus it was felt that a nuclear or echo stress test would carry a high risk of false positive result and her body habitus is not ideal for coronary CTA.  Dr. Radford Pax had recommended a cath once her respiratory status improved. This was never performed.   She reports to clinic today with a CC of recurrent SSCP. Occurs at rest. Has occurred at night and wakes her from her sleep but also occurs during the day. Feels tight and radiated up to her neck. No difficulties swallowing. Not worse after meals. She reports full compliance with her reflux medications at home. Symptoms occur off and on. Currently CP free in clinic. She is pretty sedentary and does not exert herself much. BP is well controlled at 110/56.    Current Meds  Medication Sig  . albuterol (PROVENTIL) (2.5 MG/3ML) 0.083% nebulizer solution Take 2.5 mg by nebulization 4 (four) times daily.   Marland Kitchen allopurinol (ZYLOPRIM) 100 MG tablet Take 100 mg by mouth daily.  Marland Kitchen ALPRAZolam (XANAX) 1 MG tablet Take 1 mg by mouth 2 (two) times daily as needed for anxiety.   . AMITIZA 24 MCG  capsule Take 1 capsule by mouth daily.  Marland Kitchen amLODipine (NORVASC) 2.5 MG tablet Take 1 tablet (2.5 mg total) by mouth daily.  Marland Kitchen aspirin EC 81 MG tablet Take 81 mg by mouth daily.  . budesonide-formoterol (SYMBICORT) 160-4.5 MCG/ACT inhaler Inhale 2 puffs into the lungs 2 (two) times daily.  . fluticasone (FLONASE) 50 MCG/ACT nasal spray Place 1 spray into both nostrils daily.  . furosemide (LASIX) 20 MG tablet Take 20 mg by mouth daily.  Marland Kitchen glipiZIDE (GLUCOTROL) 10 MG tablet Take 10 mg by mouth 2 (two) times daily before a meal.   . HYDROcodone-acetaminophen (NORCO) 7.5-325 MG tablet Take 1 tablet by mouth every 6 (six) hours as needed for moderate pain.  Marland Kitchen JANUVIA 100 MG tablet Take 100 mg by mouth daily.  . Linaclotide (LINZESS) 145 MCG CAPS capsule Take 145 mcg by mouth daily.   Marland Kitchen loratadine (CLARITIN) 10 MG tablet Take 10 mg by mouth daily.  Marland Kitchen nystatin cream (MYCOSTATIN) Apply 1 application topically 2 (two) times daily as needed (yeast infection.).   Marland Kitchen ondansetron (ZOFRAN) 4 MG tablet Take 1 tablet (4 mg total) by mouth every 6 (six) hours as needed for nausea or vomiting.  . OXYGEN Inhale 2.5 L into the lungs continuous.  . pantoprazole (PROTONIX) 40 MG tablet Take 1 tablet (40 mg total) by mouth daily at 12 noon.  . pioglitazone (ACTOS) 30 MG tablet Take 30  mg by mouth daily.  . pravastatin (PRAVACHOL) 20 MG tablet Take 20 mg by mouth daily.  . pregabalin (LYRICA) 75 MG capsule Take 75 mg by mouth 2 (two) times daily.  Marland Kitchen PROAIR HFA 108 (90 Base) MCG/ACT inhaler INHALE 2 PUFFS BY MOUTH EVERY 4 HOURS AS NEEDED FOR WHEEZING  . sodium chloride (OCEAN) 0.65 % SOLN nasal spray Place 1 spray into both nostrils as needed for congestion.  Marland Kitchen tiotropium (SPIRIVA) 18 MCG inhalation capsule Place 1 capsule (18 mcg total) into inhaler and inhale daily.  Marland Kitchen triamterene-hydrochlorothiazide (DYAZIDE) 37.5-25 MG per capsule Take 1 capsule by mouth daily.  Marland Kitchen zolpidem (AMBIEN) 10 MG tablet Take 10 mg by mouth at  bedtime.   Allergies  Allergen Reactions  . Acetaminophen Other (See Comments)    Pt was told that she could not take Tylenol, does not know why   Past Medical History:  Diagnosis Date  . Anemia    chronis  . Anxiety   . Arthritis   . Cancer ([place]) 1990   cervical   . Chronic diastolic CHF (congestive heart failure) ([place]) 08/10/2016  . COPD (chronic obstructive pulmonary disease) ([facility])    Pulmo: [name]  . Diabetes mellitus without complication ([place])    a. A1c 8.3 in 11/2015  . Emphysema   . Gallstones    s/p cholecystectomy  . GERD (gastroesophageal reflux disease)   . Gout   . History of home oxygen therapy    "2.5L; 24/7" (08/10/2016)  . Hx of cardiac catheterization    a. LHC at [facility] in [place], [place] 09/2008:  Normal coronary arteries EF 70%.  . Hyperlipidemia   . Hypertension   . Morbid obesity ([place])   . OSA on CPAP    CPAP at night   . Pneumonia   . Sickle cell trait ([place])   . Supplemental oxygen dependent    2L CONTINUOUSLEY  . Tobacco abuse    a. up to 3ppd from age 9 to 59, now 1/4 ppd (01/2013) >> Quit 10/2015   Family History  Problem Relation Age of Onset  . Other Father     unaware of father's medical history  . Diabetes Mother     alive @ 49  . Myasthenia gravis Mother   . Lung cancer Paternal Aunt   . Lung cancer Paternal Grandfather   . Other      multiple siblings a&w.  . Heart attack Neg Hx   . Heart failure Neg Hx    Past Surgical History:  Procedure Laterality Date  . CARDIAC CATHETERIZATION    . CHOLECYSTECTOMY N/A 12/13/2015   Procedure: LAPAROSCOPIC CHOLECYSTECTOMY;  Surgeon: Ralene Ok, MD;  Location: [facility] ORS;  Service: General;  Laterality: N/A;  . COLONOSCOPY  09/05/2012   Procedure: COLONOSCOPY;  Surgeon: Beryle Beams, MD;  Location: [facility] ENDOSCOPY;  Service: Endoscopy;  Laterality: N/A;  . COLONOSCOPY WITH PROPOFOL N/A 09/02/2015   Procedure: COLONOSCOPY WITH PROPOFOL;  Surgeon: Carol Ada, MD;  Location: [facility] ENDOSCOPY;   Service: Endoscopy;  Laterality: N/A;  . TUBAL LIGATION     Social History   Social History  . Marital status: Divorced    Spouse name: N/A  . Number of children: 3  . Years of education: 11th   Occupational History  . Disabled     Emphysema   Social History Main Topics  . Smoking status: Former Smoker    Packs/day: 0.50    Years: 36.00    Types: Cigarettes  Quit date: 11/11/2015  . Smokeless tobacco: Never Used     Comment: Approx 90 pk-yrs (up to 3ppd until ~ 2009). Smoking 3 cigs per day now.  . Alcohol use No  . Drug use: No  . Sexual activity: Yes   Other Topics Concern  . Not on file   Social History Narrative   From [place], [place].  Moved to [place] about 8 years ago to be closer to her daughter. She moved back to [place] about a year and a half ago. She lives by herself. She does not routinely exercise or adhere to any particular diet.   Caffeine Use: 1-2 cups daily   Disabled; Previously in home care     Review of Systems: General: negative for chills, fever, night sweats or weight changes.  Cardiovascular: negative for chest pain, dyspnea on exertion, edema, orthopnea, palpitations, paroxysmal nocturnal dyspnea or shortness of breath Dermatological: negative for rash Respiratory: negative for cough or wheezing Urologic: negative for hematuria Abdominal: negative for nausea, vomiting, diarrhea, bright red blood per rectum, melena, or hematemesis Neurologic: negative for visual changes, syncope, or dizziness All other systems reviewed and are otherwise negative except as noted above.   Physical Exam:  Blood pressure (!) 110/56, pulse 94, height 5' (1.524 m), weight 222 lb 12.8 oz (101.1 kg), last menstrual period 06/04/2000.  General appearance: alert, cooperative and no distress Neck: no carotid bruit and no JVD Lungs: clear to auscultation bilaterally Heart: regular rate and rhythm, S1, S2 normal, no murmur, click, rub or gallop Extremities: no  LEE Pulses: 2+ and symmetric Skin: Skin color, texture, turgor normal. No rashes or lesions Neurologic: Grossly normal  EKG not performed.   ASSESSMENT AND PLAN:   1. Chest Pain with Moderate Risk for Cardiac Etiology: ongoing intermittent chest pain in a female with multiple cardiac risk factors including DM, HTN and prior tobacco use. She is currently pain free. Due to her weight/ body habitus, a nuclear or echo stress test would carry a high risk of false positive result and her body habitus is not ideal for coronary CTA. Dr. Radford Pax had recommended back in September 2017, that she wanted her to undergo a LHC for definitive assessment of her coronaries. This was not performed as patient required treatment for acute COPD exacerbation. Her respiratory status has improved and now stable. We discussed indication for LHC and the patient agrees to proceed. We also discussed potential associate risk including risk of death, MI, stroke, bleeding, vascular complications, nephrotoxicity and allergic reaction. She understands these risk and agrees to proceed.    Lyda Jester PA-C 11/27/2016 12:19 PM

## 2016-11-28 ENCOUNTER — Telehealth: Payer: Self-pay

## 2016-11-28 NOTE — Telephone Encounter (Signed)
11/28/16  18:24 11/28/16 Call to patient to notify her of office closure 11/29/16.  Pt is scheduled for cardiac cath 12/07/16.  Pt aware labs need to be drawn a few days prior to cath.  She will call office to reschedule lab appt.  TDS

## 2016-11-29 ENCOUNTER — Other Ambulatory Visit: Payer: Medicaid Other

## 2016-11-30 ENCOUNTER — Encounter (HOSPITAL_COMMUNITY): Payer: Self-pay | Admitting: *Deleted

## 2016-11-30 ENCOUNTER — Other Ambulatory Visit: Payer: Medicaid Other | Admitting: *Deleted

## 2016-11-30 ENCOUNTER — Telehealth: Payer: Self-pay

## 2016-11-30 ENCOUNTER — Inpatient Hospital Stay (HOSPITAL_COMMUNITY)
Admission: EM | Admit: 2016-11-30 | Discharge: 2016-12-04 | DRG: 286 | Disposition: A | Discharge status: Home / Self Care | Payer: Medicaid Other | Attending: Internal Medicine | Admitting: Internal Medicine

## 2016-11-30 ENCOUNTER — Emergency Department (HOSPITAL_COMMUNITY): Payer: Medicaid Other

## 2016-11-30 DIAGNOSIS — Z801 Family history of malignant neoplasm of trachea, bronchus and lung: Secondary | ICD-10-CM

## 2016-11-30 DIAGNOSIS — E119 Type 2 diabetes mellitus without complications: Secondary | ICD-10-CM | POA: Diagnosis not present

## 2016-11-30 DIAGNOSIS — K219 Gastro-esophageal reflux disease without esophagitis: Secondary | ICD-10-CM | POA: Diagnosis present

## 2016-11-30 DIAGNOSIS — I200 Unstable angina: Principal | ICD-10-CM | POA: Diagnosis present

## 2016-11-30 DIAGNOSIS — J441 Chronic obstructive pulmonary disease with (acute) exacerbation: Secondary | ICD-10-CM | POA: Diagnosis present

## 2016-11-30 DIAGNOSIS — Z9049 Acquired absence of other specified parts of digestive tract: Secondary | ICD-10-CM

## 2016-11-30 DIAGNOSIS — E876 Hypokalemia: Secondary | ICD-10-CM | POA: Diagnosis present

## 2016-11-30 DIAGNOSIS — Z833 Family history of diabetes mellitus: Secondary | ICD-10-CM | POA: Diagnosis not present

## 2016-11-30 DIAGNOSIS — J9601 Acute respiratory failure with hypoxia: Secondary | ICD-10-CM | POA: Diagnosis present

## 2016-11-30 DIAGNOSIS — Z9981 Dependence on supplemental oxygen: Secondary | ICD-10-CM

## 2016-11-30 DIAGNOSIS — E785 Hyperlipidemia, unspecified: Secondary | ICD-10-CM | POA: Diagnosis present

## 2016-11-30 DIAGNOSIS — Z7984 Long term (current) use of oral hypoglycemic drugs: Secondary | ICD-10-CM | POA: Diagnosis not present

## 2016-11-30 DIAGNOSIS — I5033 Acute on chronic diastolic (congestive) heart failure: Secondary | ICD-10-CM | POA: Diagnosis present

## 2016-11-30 DIAGNOSIS — E1140 Type 2 diabetes mellitus with diabetic neuropathy, unspecified: Secondary | ICD-10-CM | POA: Diagnosis present

## 2016-11-30 DIAGNOSIS — Z7951 Long term (current) use of inhaled steroids: Secondary | ICD-10-CM | POA: Diagnosis not present

## 2016-11-30 DIAGNOSIS — R079 Chest pain, unspecified: Secondary | ICD-10-CM | POA: Diagnosis present

## 2016-11-30 DIAGNOSIS — I110 Hypertensive heart disease with heart failure: Secondary | ICD-10-CM | POA: Diagnosis present

## 2016-11-30 DIAGNOSIS — Z720 Tobacco use: Secondary | ICD-10-CM

## 2016-11-30 DIAGNOSIS — Z7982 Long term (current) use of aspirin: Secondary | ICD-10-CM | POA: Diagnosis not present

## 2016-11-30 DIAGNOSIS — Z79899 Other long term (current) drug therapy: Secondary | ICD-10-CM | POA: Diagnosis not present

## 2016-11-30 DIAGNOSIS — Z87891 Personal history of nicotine dependence: Secondary | ICD-10-CM | POA: Diagnosis present

## 2016-11-30 DIAGNOSIS — D573 Sickle-cell trait: Secondary | ICD-10-CM | POA: Diagnosis present

## 2016-11-30 DIAGNOSIS — Z6841 Body Mass Index (BMI) 40.0 and over, adult: Secondary | ICD-10-CM | POA: Diagnosis not present

## 2016-11-30 DIAGNOSIS — G4733 Obstructive sleep apnea (adult) (pediatric): Secondary | ICD-10-CM | POA: Diagnosis present

## 2016-11-30 LAB — BASIC METABOLIC PANEL
ANION GAP: 11 (ref 5–15)
BUN: 13 mg/dL (ref 6–20)
CALCIUM: 9.7 mg/dL (ref 8.9–10.3)
CO2: 28 mmol/L (ref 22–32)
Chloride: 103 mmol/L (ref 101–111)
Creatinine, Ser: 1.06 mg/dL — ABNORMAL HIGH (ref 0.44–1.00)
GFR calc Af Amer: >60 mL/min (ref >60)
GFR, EST NON AFRICAN AMERICAN: 57 mL/min — AB (ref >60)
Glucose, Bld: 87 mg/dL (ref 65–99)
POTASSIUM: 3.4 mmol/L — AB (ref 3.5–5.1)
SODIUM: 142 mmol/L (ref 135–145)

## 2016-11-30 LAB — CBC
HEMATOCRIT: 34.5 % — AB (ref 36.0–46.0)
HEMOGLOBIN: 11.9 g/dL — AB (ref 12.0–15.0)
MCH: 28.1 pg (ref 26.0–34.0)
MCHC: 34.5 g/dL (ref 30.0–36.0)
MCV: 81.4 fL (ref 78.0–100.0)
Platelets: 225 10*3/uL (ref 150–400)
RBC: 4.24 MIL/uL (ref 3.87–5.11)
RDW: 14.9 % (ref 11.5–15.5)
WBC: 5.4 10*3/uL (ref 4.0–10.5)

## 2016-11-30 LAB — I-STAT TROPONIN, ED: TROPONIN I, POC: 0 ng/mL (ref 0.00–0.08)

## 2016-11-30 LAB — BRAIN NATRIURETIC PEPTIDE: B NATRIURETIC PEPTIDE 5: 9.8 pg/mL (ref 0.0–100.0)

## 2016-11-30 LAB — GLUCOSE, CAPILLARY: Glucose-Capillary: 176 mg/dL — ABNORMAL HIGH (ref 65–99)

## 2016-11-30 MED ORDER — TIOTROPIUM BROMIDE MONOHYDRATE 18 MCG IN CAPS
18.0000 ug | ORAL_CAPSULE | Freq: Every day | RESPIRATORY_TRACT | Status: DC
  Administered 2016-12-01 – 2016-12-04 (×2): 18 ug via RESPIRATORY_TRACT
  Filled 2016-11-30 (×2): qty 5

## 2016-11-30 MED ORDER — LORATADINE 10 MG PO TABS
10.0000 mg | ORAL_TABLET | Freq: Every day | ORAL | Status: DC
Start: 2016-11-30 — End: 2016-12-04
  Administered 2016-11-30 – 2016-12-04 (×4): 10 mg via ORAL
  Filled 2016-11-30 (×5): qty 1

## 2016-11-30 MED ORDER — FLUTICASONE PROPIONATE 50 MCG/ACT NA SUSP
1.0000 | Freq: Every day | NASAL | Status: DC
  Administered 2016-11-30 – 2016-12-03 (×4): 1 via NASAL
  Filled 2016-11-30: qty 16

## 2016-11-30 MED ORDER — AMLODIPINE BESYLATE 2.5 MG PO TABS
2.5000 mg | ORAL_TABLET | Freq: Every day | ORAL | Status: DC
  Administered 2016-12-01: 2.5 mg via ORAL
  Filled 2016-11-30: qty 1

## 2016-11-30 MED ORDER — NITROGLYCERIN 0.4 MG SL SUBL: 0.4000 mg | SUBLINGUAL_TABLET | SUBLINGUAL | Status: DC | PRN

## 2016-11-30 MED ORDER — INSULIN GLARGINE 100 UNIT/ML ~~LOC~~ SOLN
14.0000 [IU] | Freq: Every day | SUBCUTANEOUS | Status: DC
  Administered 2016-11-30 – 2016-12-03 (×4): 14 [IU] via SUBCUTANEOUS
  Filled 2016-11-30 (×4): qty 0.14

## 2016-11-30 MED ORDER — ZOLPIDEM TARTRATE 5 MG PO TABS
5.0000 mg | ORAL_TABLET | Freq: Every day | ORAL | Status: DC
  Administered 2016-11-30 – 2016-12-03 (×4): 5 mg via ORAL
  Filled 2016-11-30 (×4): qty 1

## 2016-11-30 MED ORDER — PANTOPRAZOLE SODIUM 40 MG PO TBEC
40.0000 mg | DELAYED_RELEASE_TABLET | Freq: Every day | ORAL | Status: DC
  Administered 2016-12-01 – 2016-12-03 (×3): 40 mg via ORAL
  Filled 2016-11-30 (×3): qty 1

## 2016-11-30 MED ORDER — FUROSEMIDE 10 MG/ML IJ SOLN
40.0000 mg | Freq: Two times a day (BID) | INTRAMUSCULAR | Status: DC
  Administered 2016-12-01: 40 mg via INTRAVENOUS
  Filled 2016-11-30: qty 4

## 2016-11-30 MED ORDER — ALLOPURINOL 100 MG PO TABS
100.0000 mg | ORAL_TABLET | Freq: Every day | ORAL | Status: DC
  Administered 2016-12-01 – 2016-12-04 (×4): 100 mg via ORAL
  Filled 2016-11-30 (×4): qty 1

## 2016-11-30 MED ORDER — ONDANSETRON HCL 4 MG PO TABS: 4.0000 mg | ORAL_TABLET | Freq: Four times a day (QID) | ORAL | Status: DC | PRN

## 2016-11-30 MED ORDER — HEPARIN SODIUM (PORCINE) 5000 UNIT/ML IJ SOLN
5000.0000 [IU] | Freq: Three times a day (TID) | INTRAMUSCULAR | Status: DC
  Administered 2016-11-30 – 2016-12-03 (×8): 5000 [IU] via SUBCUTANEOUS
  Filled 2016-11-30 (×8): qty 1

## 2016-11-30 MED ORDER — LUBIPROSTONE 24 MCG PO CAPS
24.0000 ug | ORAL_CAPSULE | Freq: Every day | ORAL | Status: DC
Start: 2016-12-01 — End: 2016-12-04
  Administered 2016-12-01 – 2016-12-04 (×3): 24 ug via ORAL
  Filled 2016-11-30 (×5): qty 1

## 2016-11-30 MED ORDER — LINAGLIPTIN 5 MG PO TABS
5.0000 mg | ORAL_TABLET | Freq: Every day | ORAL | Status: DC
  Administered 2016-12-01 – 2016-12-04 (×3): 5 mg via ORAL
  Filled 2016-11-30 (×3): qty 1

## 2016-11-30 MED ORDER — SODIUM CHLORIDE 0.9 % IV SOLN: 250.0000 mL | INTRAVENOUS | Status: DC | PRN

## 2016-11-30 MED ORDER — PREGABALIN 75 MG PO CAPS
75.0000 mg | ORAL_CAPSULE | Freq: Two times a day (BID) | ORAL | Status: DC
  Administered 2016-11-30 – 2016-12-04 (×8): 75 mg via ORAL
  Filled 2016-11-30 (×8): qty 1

## 2016-11-30 MED ORDER — IPRATROPIUM-ALBUTEROL 0.5-2.5 (3) MG/3ML IN SOLN
3.0000 mL | Freq: Three times a day (TID) | RESPIRATORY_TRACT | Status: DC
  Administered 2016-12-01 – 2016-12-04 (×9): 3 mL via RESPIRATORY_TRACT
  Filled 2016-11-30 (×12): qty 3

## 2016-11-30 MED ORDER — ASPIRIN EC 81 MG PO TBEC
81.0000 mg | DELAYED_RELEASE_TABLET | Freq: Every day | ORAL | Status: DC
  Administered 2016-12-01 – 2016-12-04 (×4): 81 mg via ORAL
  Filled 2016-11-30 (×5): qty 1

## 2016-11-30 MED ORDER — TRIAMTERENE-HCTZ 37.5-25 MG PO CAPS
1.0000 | ORAL_CAPSULE | Freq: Every day | ORAL | Status: DC
  Administered 2016-12-01 – 2016-12-04 (×3): 1 via ORAL
  Filled 2016-11-30 (×4): qty 1

## 2016-11-30 MED ORDER — SODIUM CHLORIDE 0.9% FLUSH: 3.0000 mL | INTRAVENOUS | Status: DC | PRN

## 2016-11-30 MED ORDER — GLIPIZIDE 10 MG PO TABS
10.0000 mg | ORAL_TABLET | Freq: Two times a day (BID) | ORAL | Status: DC
Start: 2016-12-01 — End: 2016-12-04
  Administered 2016-12-01 – 2016-12-04 (×5): 10 mg via ORAL
  Filled 2016-11-30 (×7): qty 1

## 2016-11-30 MED ORDER — SODIUM CHLORIDE 0.9% FLUSH
3.0000 mL | Freq: Two times a day (BID) | INTRAVENOUS | Status: DC
  Administered 2016-11-30 – 2016-12-03 (×5): 3 mL via INTRAVENOUS

## 2016-11-30 MED ORDER — IPRATROPIUM-ALBUTEROL 0.5-2.5 (3) MG/3ML IN SOLN
3.0000 mL | Freq: Four times a day (QID) | RESPIRATORY_TRACT | Status: DC
  Administered 2016-11-30: 3 mL via RESPIRATORY_TRACT
  Filled 2016-11-30: qty 3

## 2016-11-30 MED ORDER — MOMETASONE FURO-FORMOTEROL FUM 200-5 MCG/ACT IN AERO
2.0000 | INHALATION_SPRAY | Freq: Two times a day (BID) | RESPIRATORY_TRACT | Status: DC
  Administered 2016-12-01 – 2016-12-04 (×4): 2 via RESPIRATORY_TRACT
  Filled 2016-11-30: qty 8.8

## 2016-11-30 MED ORDER — ACETAMINOPHEN 325 MG PO TABS
650.0000 mg | ORAL_TABLET | ORAL | Status: DC | PRN
  Administered 2016-12-01 – 2016-12-04 (×5): 650 mg via ORAL
  Filled 2016-11-30 (×5): qty 2

## 2016-11-30 MED ORDER — SALINE SPRAY 0.65 % NA SOLN
1.0000 | NASAL | Status: DC | PRN
  Filled 2016-11-30: qty 44

## 2016-11-30 MED ORDER — ORAL CARE MOUTH RINSE
15.0000 mL | Freq: Two times a day (BID) | OROMUCOSAL | Status: DC
  Administered 2016-11-30 – 2016-12-03 (×5): 15 mL via OROMUCOSAL

## 2016-11-30 MED ORDER — HYDROCODONE-ACETAMINOPHEN 7.5-325 MG PO TABS: 1.0000 | ORAL_TABLET | Freq: Four times a day (QID) | ORAL | Status: DC | PRN

## 2016-11-30 MED ORDER — ALPRAZOLAM 0.5 MG PO TABS
1.0000 mg | ORAL_TABLET | Freq: Two times a day (BID) | ORAL | Status: DC | PRN
  Administered 2016-12-03: 1 mg via ORAL
  Filled 2016-11-30: qty 2

## 2016-11-30 MED ORDER — PRAVASTATIN SODIUM 20 MG PO TABS
20.0000 mg | ORAL_TABLET | Freq: Every day | ORAL | Status: DC
  Administered 2016-12-01 – 2016-12-03 (×3): 20 mg via ORAL
  Filled 2016-11-30 (×3): qty 1

## 2016-11-30 NOTE — H&P (Signed)
ADMISSION HISTORY & PHYSICAL   Chief Complaint:  Chest pain, progressive dyspnea  Cardiologist: Dr. Radford Pax  Primary Care Physician: Barbette Merino, MD  HPI:  This is a 61 y.o. female with a past medical history (per Ellen Henri, PA-C's note on 11/27/2016) significant for "DM2, HTN, COPD on chronic O2, OSA on CPAP, prior tobacco abuse. LHC at [facility] in [place], [place] 09/2008: Normal coronary arteries EF 70%. Echo 12/16/15: Mod LVH, EF 60-65%, normal wall motion, grade 1DD. She was last seen by Dr. Radford Pax 08/10/2016. She complained of worsening dyspnea. Per office note, she was not felt to be volume overloaded.  It was felt that her symptoms were secondary to acute COPD exacerbation and Dr. Radford Pax had recommended hospital admission. She was sent to [facility] ED where she was admitted by IM and treated for COPD.  It was also outlined in Dr. Theodosia Blender note that she had also had issues with chest discomfort, however due to her weight/ body habitus it was felt that a nuclear or echo stress test would carry a high risk of false positive result and her body habitus is not ideal for coronary CTA. Dr. Radford Pax had recommended a cath once her respiratory status improved. This was never performed. 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 Has occurred at night and wakes her from her sleep but also occurs during the day. Feels tight and radiated up to her neck. No difficulties swallowing. Not worse after meals. She reports full compliance with her reflux medications at home. Symptoms occur off and on. She was scheduled for an elective LHC on 12/07/2016.  She now presents with worsening dyspnea and hypoxia as well as left-sided chest pain. SPO2 today was 96% on 4L [place] (she is normally 90% on 2L [place]). She was advised to present to the ER by Dr. Radford Pax for evaluation. EKG on admission shows NSR without ischemic changes. Echo performed in 12/2015 shows EF of 60-65%, mild LVH, and grade 1 DD.  Initial troponin is negative. CXR suggests possible mild interstitial edema. Cardiology is asked to evaluate for chest pain and progressive dyspnea.  PMHx:  Past Medical History:  Diagnosis Date  . Anemia    chronis  . Anxiety   . Arthritis   . Cancer ([place]) 1990   cervical   . Chronic diastolic CHF (congestive heart failure) ([place]) 08/10/2016  . COPD (chronic obstructive pulmonary disease) (HCC)    Pulmo: [name]  . Diabetes mellitus without complication ([place])    a. A1c 8.3 in 11/2015  . Emphysema   . Gallstones    s/p cholecystectomy  . GERD (gastroesophageal reflux disease)   . Gout   . History of home oxygen therapy    "2.5L; 24/7" (08/10/2016)  . Hx of cardiac catheterization    a. LHC at [facility] in [place], [place] 09/2008:  Normal coronary arteries EF 70%.  . Hyperlipidemia   . Hypertension   . Morbid obesity ([place])   . OSA on CPAP    CPAP at night   . Pneumonia   . Sickle cell trait ([place])   . Supplemental oxygen dependent    2L CONTINUOUSLEY  . Tobacco abuse    a. up to 3ppd from age 55 to 6, now 1/4 ppd (01/2013) >> Quit 10/2015    Past Surgical History:  Procedure Laterality Date  . CARDIAC CATHETERIZATION    . CHOLECYSTECTOMY N/A 12/13/2015   Procedure: LAPAROSCOPIC CHOLECYSTECTOMY;  Surgeon: Ralene Ok, MD;  Location:  [facility] ORS;  Service: General;  Laterality: N/A;  . COLONOSCOPY  09/05/2012   Procedure: COLONOSCOPY;  Surgeon: Beryle Beams, MD;  Location: [facility] ENDOSCOPY;  Service: Endoscopy;  Laterality: N/A;  . COLONOSCOPY WITH PROPOFOL N/A 09/02/2015   Procedure: COLONOSCOPY WITH PROPOFOL;  Surgeon: Carol Ada, MD;  Location: [facility] ENDOSCOPY;  Service: Endoscopy;  Laterality: N/A;  . TUBAL LIGATION      FAMHx:  Family History  Problem Relation Age of Onset  . Other Father     unaware of father's medical history  . Diabetes Mother     alive @ 48  . Myasthenia gravis Mother   . Lung cancer Paternal Aunt   . Lung cancer Paternal Grandfather   . Other       multiple siblings a&w.  . Heart attack Neg Hx   . Heart failure Neg Hx     SOCHx:   reports that she quit smoking about 12 months ago. Her smoking use included Cigarettes. She has a 18.00 pack-year smoking history. She has never used smokeless tobacco. She reports that she does not drink alcohol or use drugs.  ALLERGIES:  No Active Allergies  ROS: Pertinent items noted in HPI and remainder of comprehensive ROS otherwise negative.  HOME MEDS: No current facility-administered medications on file prior to encounter.    Current Outpatient Prescriptions on File Prior to Encounter  Medication Sig Dispense Refill  . albuterol (PROVENTIL) (2.5 MG/3ML) 0.083% nebulizer solution Take 2.5 mg by nebulization 4 (four) times daily.     Marland Kitchen allopurinol (ZYLOPRIM) 100 MG tablet Take 100 mg by mouth daily.    Marland Kitchen ALPRAZolam (XANAX) 1 MG tablet Take 1 mg by mouth 2 (two) times daily as needed for anxiety.   0  . AMITIZA 24 MCG capsule Take 1 capsule by mouth daily.  0  . amLODipine (NORVASC) 2.5 MG tablet Take 1 tablet (2.5 mg total) by mouth daily. 30 tablet 11  . aspirin EC 81 MG tablet Take 81 mg by mouth daily.    . budesonide-formoterol (SYMBICORT) 160-4.5 MCG/ACT inhaler Inhale 2 puffs into the lungs 2 (two) times daily. 1 Inhaler 6  . fluticasone (FLONASE) 50 MCG/ACT nasal spray Place 1 spray into both nostrils daily. 16 g 0  . furosemide (LASIX) 20 MG tablet Take 20 mg by mouth daily.    Marland Kitchen glipiZIDE (GLUCOTROL) 10 MG tablet Take 10 mg by mouth 2 (two) times daily before a meal.     . HYDROcodone-acetaminophen (NORCO) 7.5-325 MG tablet Take 1 tablet by mouth every 6 (six) hours as needed for moderate pain.    Marland Kitchen JANUVIA 100 MG tablet Take 100 mg by mouth daily.  0  . loratadine (CLARITIN) 10 MG tablet Take 10 mg by mouth daily.    Marland Kitchen nystatin cream (MYCOSTATIN) Apply 1 application topically 2 (two) times daily as needed (yeast infection.).     Marland Kitchen ondansetron (ZOFRAN) 4 MG tablet Take 1 tablet (4 mg  total) by mouth every 6 (six) hours as needed for nausea or vomiting. 12 tablet 0  . pantoprazole (PROTONIX) 40 MG tablet Take 1 tablet (40 mg total) by mouth daily at 12 noon.    . pioglitazone (ACTOS) 30 MG tablet Take 30 mg by mouth daily.  0  . pravastatin (PRAVACHOL) 20 MG tablet Take 20 mg by mouth daily.    . pregabalin (LYRICA) 75 MG capsule Take 75 mg by mouth 2 (two) times daily.    Marland Kitchen PROAIR HFA 108 (90  Base) MCG/ACT inhaler INHALE 2 PUFFS BY MOUTH EVERY 4 HOURS AS NEEDED FOR WHEEZING 8.5 g 2  . sodium chloride (OCEAN) 0.65 % SOLN nasal spray Place 1 spray into both nostrils as needed for congestion.    Marland Kitchen tiotropium (SPIRIVA) 18 MCG inhalation capsule Place 1 capsule (18 mcg total) into inhaler and inhale daily. 30 capsule 6  . triamterene-hydrochlorothiazide (DYAZIDE) 37.5-25 MG per capsule Take 1 capsule by mouth daily.    Marland Kitchen zolpidem (AMBIEN) 10 MG tablet Take 10 mg by mouth at bedtime.    . Linaclotide (LINZESS) 145 MCG CAPS capsule Take 145 mcg by mouth daily.     . OXYGEN Inhale 2.5 L into the lungs continuous.      LABS/IMAGING: Results for orders placed or performed during the hospital encounter of 11/30/16 (from the past 48 hour(s))  Basic metabolic panel     Status: Abnormal   Collection Time: 11/30/16  2:32 PM  Result Value Ref Range   Sodium 142 135 - 145 mmol/L   Potassium 3.4 (L) 3.5 - 5.1 mmol/L   Chloride 103 101 - 111 mmol/L   CO2 28 22 - 32 mmol/L   Glucose, Bld 87 65 - 99 mg/dL   BUN 13 6 - 20 mg/dL   Creatinine, Ser 1.06 (H) 0.44 - 1.00 mg/dL   Calcium 9.7 8.9 - 10.3 mg/dL   GFR calc non Af Amer 57 (L) >60 mL/min   GFR calc Af Amer >60 >60 mL/min    Comment: (NOTE) The eGFR has been calculated using the CKD EPI equation. This calculation has not been validated in all clinical situations. eGFR's persistently <60 mL/min signify possible Chronic Kidney Disease.    Anion gap 11 5 - 15  CBC     Status: Abnormal   Collection Time: 11/30/16  2:32 PM  Result  Value Ref Range   WBC 5.4 4.0 - 10.5 K/uL   RBC 4.24 3.87 - 5.11 MIL/uL   Hemoglobin 11.9 (L) 12.0 - 15.0 g/dL   HCT 34.5 (L) 36.0 - 46.0 %   MCV 81.4 78.0 - 100.0 fL   MCH 28.1 26.0 - 34.0 pg   MCHC 34.5 30.0 - 36.0 g/dL   RDW 14.9 11.5 - 15.5 %   Platelets 225 150 - 400 K/uL  I-stat troponin, ED     Status: None   Collection Time: 11/30/16  2:37 PM  Result Value Ref Range   Troponin i, poc 0.00 0.00 - 0.08 ng/mL   Comment 3            Comment: Due to the release kinetics of cTnI, a negative result within the first hours of the onset of symptoms does not rule out myocardial infarction with certainty. If myocardial infarction is still suspected, repeat the test at appropriate intervals.    Dg Chest 2 View  Result Date: 11/30/2016 CLINICAL DATA:  Left-sided chest pain.  Shortness breath. EXAM: CHEST  2 VIEW COMPARISON:  Two-view chest x-ray 08/10/2016. FINDINGS: The heart size is normal. Aortic atherosclerosis is present. Interstitial prominence has increased. Linear density is present at the right base. Remote left-sided rib fractures are present. IMPRESSION: 1. Increase and interstitial prominence may reflect mild edema. 2. Linear density at the right base likely reflects atelectasis. 3. Aortic atherosclerosis. Electronically Signed   By: San Morelle M.D.   On: 11/30/2016 14:55    VITALS: Vitals:   11/30/16 1730 11/30/16 1745  BP: 108/62 (!) 112/54  Pulse: 76 81  Resp:  26   Temp:      EXAM: General appearance: alert, mild distress and morbidly obese Neck: no carotid bruit and no JVD Lungs: diminished breath sounds bilaterally and rhonchi LUL and RUL Heart: regular rate and rhythm Abdomen: soft, non-tender; bowel sounds normal; no masses,  no organomegaly and obese Extremities: extremities normal, atraumatic, no cyanosis or edema Pulses: 2+ and symmetric Skin: Skin color, texture, turgor normal. No rashes or lesions Neurologic: Grossly normal Psych: Mildly  anxious  IMPRESSION: Principal Problem:   Unstable angina (HCC) Active Problems:   GERD (gastroesophageal reflux disease)   Tobacco abuse   DM type 2 (diabetes mellitus, type 2) (HCC)   Acute respiratory failure with hypoxia (HCC)   COPD exacerbation (HCC)   PLAN: 1. Unstable angina - progressive worsening of central chest pain over the past 2 weeks. Was scheduled for an elective cath next Friday - will likely move this up to Monday. Trend troponins. Hold on heparin unless they are positive. Use nitrates and morphine for pain control. 2. COPD - she is also had progressive dyspnea and productive cough with increased oxygen requirement and more sputum production suggestive of acute exacerbation of COPD. Labs are fairly unremarkable and chest x-ray shows some mild increase in interstitial prominence. Will start with scheduled nebulizers. 3. Tobacco abuse - encourage smoking cessation 4. Type 2 diabetes - continue home therapies 5. GERD - continue home therapies, add H2 blocker and GI cocktail x 1 for chest pain.  Full code  Pixie Casino, MD, [facility] Attending Cardiologist Zearing C Hilty 11/30/2016, 6:13 PM

## 2016-11-30 NOTE — Progress Notes (Signed)
Pt takes 14 unit of pantus at night. Will contact MD on call about resuming this medication.

## 2016-11-30 NOTE — Telephone Encounter (Signed)
Received call in Triage that pt was SOB.  Pt here for blood draw in the lab. Pt on continuous 2 L O2 via [place]. Pt scheduled for cath on 12/07/16. Pt stated she has been SOB and had symptoms of indigestion for the past 2 weeks. Pt stated she is having slight CP described as an "ache in her chest". Checked vitals: O2 Sat 90%, HR 86, RR 16. Pt stated O2 Sats  are normally around 96% on 2 L O2. Informed Dr. Radford Pax to advise. Dr. Radford Pax recommended pt to report to the emergency department. Pt stated she did not have a ride to the ED. Called 911 for transport to [facility] Emergency Department. Stayed with pt until EMS arrived.

## 2016-11-30 NOTE — ED Provider Notes (Signed)
[facility] DEPT Provider Note   CSN: [id number] Arrival date & time: 11/30/16  1400     History   Chief Complaint Chief Complaint  Patient presents with  . Shortness of Breath  . Chest Pain    HPI Stephanie Schultz is a 61 y.o. female.  HPI Patient was sent in from cardiology preop labs. She is scheduled to have a heart cath in a week for her chest pain and dyspnea. Had shortness of breath there along with her chest pain was sent to the ER for further evaluation. States she's had some pain in her mid chest. Feels like her indigestion. She's been having this pain over the last couple weeks. May been worse last couple days. Also worsening shortness of breath. Cough with slight sputum production. She is on chronic oxygen. States that she was able to check on her neighbor and walk earlier today without pain but did get chest pain while she was walking also at times. Recent admission to hospital for shortness of breath COPD with chest pain. Patient's cardiologist is Dr. Radford Pax.   Past Medical History:  Diagnosis Date  . Anemia    chronis  . Anxiety   . Arthritis   . Cancer ([place]) 1990   cervical   . Chronic diastolic CHF (congestive heart failure) ([place]) 08/10/2016  . COPD (chronic obstructive pulmonary disease) (HCC)    Pulmo: [name]  . Diabetes mellitus without complication ([place])    a. A1c 8.3 in 11/2015  . Emphysema   . Gallstones    s/p cholecystectomy  . GERD (gastroesophageal reflux disease)   . Gout   . History of home oxygen therapy    "2.5L; 24/7" (08/10/2016)  . Hx of cardiac catheterization    a. LHC at [facility] in [place], [place] 09/2008:  Normal coronary arteries EF 70%.  . Hyperlipidemia   . Hypertension   . Morbid obesity ([place])   . OSA on CPAP    CPAP at night   . Pneumonia   . Sickle cell trait ([place])   . Supplemental oxygen dependent    2L CONTINUOUSLEY  . Tobacco abuse    a. up to 3ppd from age 14 to 5, now 1/4 ppd (01/2013) >> Quit 10/2015    Patient  Active Problem List   Diagnosis Date Noted  . Unstable angina ([place]) 11/30/2016  . Chronic diastolic CHF (congestive heart failure) ([place]) 08/10/2016  . Acute bronchitis 07/22/2016  . Acute respiratory failure ([place]) 07/20/2016  . COPD exacerbation ([place]) 01/23/2016  . S/P laparoscopic cholecystectomy 12/13/2015  . Chronic respiratory failure ([place]) 03/31/2015  . Rib fracture 03/31/2015  . Acute respiratory failure with hypoxia ([place]) 02/17/2015  . Arthritis 12/13/2014  . Gout 03/03/2014  . Diabetic neuropathy ([place]) 03/03/2014  . Carpal tunnel syndrome 03/03/2014  . DM type 2 (diabetes mellitus, type 2) ([place]) 05/22/2013  . Anxiety 05/22/2013  . Transaminasemia 05/09/2013  . Leukocytosis 04/27/2013  . Tobacco abuse   . Chest pain   . Sickle cell trait ([place])   . Morbid obesity ([place])   . Acute on chronic respiratory failure ([place]) 01/16/2013  . Diastolic dysfunction [id number]  . Hoarseness 12/22/2012  . Epistaxis, recurrent 07/08/2012  . GERD (gastroesophageal reflux disease) 09/20/2011  . Pulmonary nodule 09/20/2011  . COPD (chronic obstructive pulmonary disease) ([place]) 04/04/2011  . Diabetes mellitus type 2, controlled ([place]) 04/04/2011  . Hypertension 04/04/2011  . Obstructive sleep apnea 04/04/2011  . Chronic allergic rhinitis 04/04/2011    Past Surgical History:  Procedure Laterality Date  . CARDIAC CATHETERIZATION    . CHOLECYSTECTOMY N/A 12/13/2015   Procedure: LAPAROSCOPIC CHOLECYSTECTOMY;  Surgeon: Ralene Ok, MD;  Location: [facility] ORS;  Service: General;  Laterality: N/A;  . COLONOSCOPY  09/05/2012   Procedure: COLONOSCOPY;  Surgeon: Beryle Beams, MD;  Location: [facility] ENDOSCOPY;  Service: Endoscopy;  Laterality: N/A;  . COLONOSCOPY WITH PROPOFOL N/A 09/02/2015   Procedure: COLONOSCOPY WITH PROPOFOL;  Surgeon: Carol Ada, MD;  Location: [facility] ENDOSCOPY;  Service: Endoscopy;  Laterality: N/A;  . TUBAL LIGATION      OB History    No data available       Home  Medications    Prior to Admission medications   Medication Sig Start Date End Date Taking? Authorizing Provider  albuterol (PROVENTIL) (2.5 MG/3ML) 0.083% nebulizer solution Take 2.5 mg by nebulization 4 (four) times daily.    Yes Historical Provider, MD  allopurinol (ZYLOPRIM) 100 MG tablet Take 100 mg by mouth daily.   Yes Historical Provider, MD  ALPRAZolam Duanne Moron) 1 MG tablet Take 1 mg by mouth 2 (two) times daily as needed for anxiety.  04/13/16  Yes Historical Provider, MD  AMITIZA 24 MCG capsule Take 1 capsule by mouth daily. 07/13/16  Yes Historical Provider, MD  amLODipine (NORVASC) 2.5 MG tablet Take 1 tablet (2.5 mg total) by mouth daily. 04/26/16  Yes Liliane Shi, PA-C  aspirin EC 81 MG tablet Take 81 mg by mouth daily.   Yes Historical Provider, MD  budesonide-formoterol (SYMBICORT) 160-4.5 MCG/ACT inhaler Inhale 2 puffs into the lungs 2 (two) times daily. 05/03/15  Yes Collene Gobble, MD  fluticasone (FLONASE) 50 MCG/ACT nasal spray Place 1 spray into both nostrils daily. 03/24/16  Yes Belkys A Regalado, MD  furosemide (LASIX) 20 MG tablet Take 20 mg by mouth daily.   Yes Historical Provider, MD  glipiZIDE (GLUCOTROL) 10 MG tablet Take 10 mg by mouth 2 (two) times daily before a meal.    Yes Historical Provider, MD  HYDROcodone-acetaminophen (NORCO) 7.5-325 MG tablet Take 1 tablet by mouth every 6 (six) hours as needed for moderate pain.   Yes Historical Provider, MD  JANUVIA 100 MG tablet Take 100 mg by mouth daily. 05/24/16  Yes Historical Provider, MD  loratadine (CLARITIN) 10 MG tablet Take 10 mg by mouth daily.   Yes Historical Provider, MD  nystatin cream (MYCOSTATIN) Apply 1 application topically 2 (two) times daily as needed (yeast infection.).    Yes Historical Provider, MD  ondansetron (ZOFRAN) 4 MG tablet Take 1 tablet (4 mg total) by mouth every 6 (six) hours as needed for nausea or vomiting. 05/30/16  Yes Bethany Molt, DO  pantoprazole (PROTONIX) 40 MG tablet Take 1 tablet (40  mg total) by mouth daily at 12 noon. 07/22/16  Yes Debbe Odea, MD  pioglitazone (ACTOS) 30 MG tablet Take 30 mg by mouth daily. 12/29/15  Yes Historical Provider, MD  pravastatin (PRAVACHOL) 20 MG tablet Take 20 mg by mouth daily.   Yes Historical Provider, MD  pregabalin (LYRICA) 75 MG capsule Take 75 mg by mouth 2 (two) times daily.   Yes Historical Provider, MD  PROAIR HFA 108 (90 Base) MCG/ACT inhaler INHALE 2 PUFFS BY MOUTH EVERY 4 HOURS AS NEEDED FOR WHEEZING 06/14/16  Yes Collene Gobble, MD  sodium chloride (OCEAN) 0.65 % SOLN nasal spray Place 1 spray into both nostrils as needed for congestion.   Yes Historical Provider, MD  tiotropium (SPIRIVA) 18 MCG inhalation capsule Place 1  capsule (18 mcg total) into inhaler and inhale daily. 05/03/15  Yes Collene Gobble, MD  triamterene-hydrochlorothiazide (DYAZIDE) 37.5-25 MG per capsule Take 1 capsule by mouth daily.   Yes Historical Provider, MD  zolpidem (AMBIEN) 10 MG tablet Take 10 mg by mouth at bedtime.   Yes Historical Provider, MD  Linaclotide Rolan Lipa) 145 MCG CAPS capsule Take 145 mcg by mouth daily.     Historical Provider, MD  OXYGEN Inhale 2.5 L into the lungs continuous.    Historical Provider, MD    Family History Family History  Problem Relation Age of Onset  . Other Father     unaware of father's medical history  . Diabetes Mother     alive @ 36  . Myasthenia gravis Mother   . Lung cancer Paternal Aunt   . Lung cancer Paternal Grandfather   . Other      multiple siblings a&w.  . Heart attack Neg Hx   . Heart failure Neg Hx     Social History Social History  Substance Use Topics  . Smoking status: Former Smoker    Packs/day: 0.50    Years: 36.00    Types: Cigarettes    Quit date: 11/11/2015  . Smokeless tobacco: Never Used     Comment: Approx 90 pk-yrs (up to 3ppd until ~ 2009). Smoking 3 cigs per day now.  . Alcohol use No     Allergies   Patient has no active allergies.   Review of Systems Review of  Systems  Constitutional: Negative for appetite change and fever.  HENT: Negative for congestion.   Respiratory: Positive for cough and shortness of breath.   Cardiovascular: Positive for chest pain.  Gastrointestinal: Negative for abdominal pain.  Genitourinary: Negative for flank pain.  Musculoskeletal: Negative for back pain.  Skin: Negative for wound.  Neurological: Negative for syncope.  Hematological: Negative for adenopathy.     Physical Exam Updated Vital Signs BP 104/69 (BP Location: Right Arm)   Pulse 79   Temp 97.4 F (36.3 C) (Oral)   Resp 18   Ht 5' (1.524 m)   Wt 221 lb 3.2 oz (100.3 kg)   LMP 06/04/2000 (LMP Unknown)   SpO2 97%   BMI 43.20 kg/m   Physical Exam  Constitutional: She appears well-developed.  HENT:  Head: Atraumatic.  Eyes: Pupils are equal, round, and reactive to light.  Neck: Neck supple.  Cardiovascular: Normal rate.   Pulmonary/Chest: Effort normal. No respiratory distress. She has no wheezes.  Abdominal: There is no tenderness.  Musculoskeletal: She exhibits no edema.  Neurological: She is alert.  Skin: Skin is warm. Capillary refill takes less than 2 seconds.  Psychiatric: She has a normal mood and affect.     ED Treatments / Results  Labs (all labs ordered are listed, but only abnormal results are displayed) Labs Reviewed  BASIC METABOLIC PANEL - Abnormal; Notable for the following:       Result Value   Potassium 3.4 (*)    Creatinine, Ser 1.06 (*)    GFR calc non Af Amer 57 (*)    All other components within normal limits  CBC - Abnormal; Notable for the following:    Hemoglobin 11.9 (*)    HCT 34.5 (*)    All other components within normal limits  GLUCOSE, CAPILLARY - Abnormal; Notable for the following:    Glucose-Capillary 176 (*)    All other components within normal limits  MRSA PCR SCREENING  BRAIN NATRIURETIC PEPTIDE  LIPID PANEL  BASIC METABOLIC PANEL  CBC  I-STAT TROPOININ, ED    EKG  EKG  Interpretation  Date/Time:  Friday November 30 2016 14:18:46 EST Ventricular Rate:  81 PR Interval:  144 QRS Duration: 82 QT Interval:  408 QTC Calculation: 473 R Axis:   63 Text Interpretation:  Normal sinus rhythm Normal ECG Confirmed by Alvino Chapel  MD, Harl Wiechmann [phone]) on 11/30/2016 4:43:11 PM       Radiology Dg Chest 2 View  Result Date: 11/30/2016 CLINICAL DATA:  Left-sided chest pain.  Shortness breath. EXAM: CHEST  2 VIEW COMPARISON:  Two-view chest x-ray 08/10/2016. FINDINGS: The heart size is normal. Aortic atherosclerosis is present. Interstitial prominence has increased. Linear density is present at the right base. Remote left-sided rib fractures are present. IMPRESSION: 1. Increase and interstitial prominence may reflect mild edema. 2. Linear density at the right base likely reflects atelectasis. 3. Aortic atherosclerosis. Electronically Signed   By: San Morelle M.D.   On: 11/30/2016 14:55    Procedures Procedures (including critical care time)  Medications Ordered in ED Medications  aspirin EC tablet 81 mg (not administered)  lubiprostone (AMITIZA) capsule 24 mcg (not administered)  pantoprazole (PROTONIX) EC tablet 40 mg (not administered)  ALPRAZolam (XANAX) tablet 1 mg (not administered)  linagliptin (TRADJENTA) tablet 5 mg (not administered)  ondansetron (ZOFRAN) tablet 4 mg (not administered)  sodium chloride (OCEAN) 0.65 % nasal spray 1 spray (not administered)  amLODipine (NORVASC) tablet 2.5 mg (not administered)  HYDROcodone-acetaminophen (NORCO) 7.5-325 MG per tablet 1 tablet (not administered)  fluticasone (FLONASE) 50 MCG/ACT nasal spray 1 spray (1 spray Each Nare Given 11/30/16 2304)  pravastatin (PRAVACHOL) tablet 20 mg (not administered)  mometasone-formoterol (DULERA) 200-5 MCG/ACT inhaler 2 puff (2 puffs Inhalation Not Given 11/30/16 2115)  tiotropium (SPIRIVA) inhalation capsule 18 mcg (not administered)  pregabalin (LYRICA) capsule 75 mg (75 mg  Oral Given 11/30/16 2248)  allopurinol (ZYLOPRIM) tablet 100 mg (not administered)  loratadine (CLARITIN) tablet 10 mg (10 mg Oral Given 11/30/16 2248)  triamterene-hydrochlorothiazide (DYAZIDE) 37.5-25 MG per capsule 1 capsule (not administered)  zolpidem (AMBIEN) tablet 5 mg (5 mg Oral Given 11/30/16 2248)  glipiZIDE (GLUCOTROL) tablet 10 mg (not administered)  nitroGLYCERIN (NITROSTAT) SL tablet 0.4 mg (not administered)  acetaminophen (TYLENOL) tablet 650 mg (not administered)  heparin injection 5,000 Units (5,000 Units Subcutaneous Given 11/30/16 2248)  sodium chloride flush (NS) 0.9 % injection 3 mL (3 mLs Intravenous Given 11/30/16 2249)  sodium chloride flush (NS) 0.9 % injection 3 mL (not administered)  0.9 %  sodium chloride infusion (not administered)  furosemide (LASIX) injection 40 mg (not administered)  insulin glargine (LANTUS) injection 14 Units (14 Units Subcutaneous Given 11/30/16 2248)  MEDLINE mouth rinse (15 mLs Mouth Rinse Given 11/30/16 2200)  ipratropium-albuterol (DUONEB) 0.5-2.5 (3) MG/3ML nebulizer solution 3 mL (not administered)     Initial Impression / Assessment and Plan / ED Course  I have reviewed the triage vital signs and the nursing notes.  Pertinent labs & imaging results that were available during my care of the patient were reviewed by me and considered in my medical decision making (see chart for details).     Patient with chest pain. History of same. Due for stress test in 1 week. Seen by cardiology and will admit the patient. EKG and lab work reassuring.Potential for unstable angina.  Final Clinical Impressions(s) / ED Diagnoses   Final diagnoses:  Unstable angina [facility])    New Prescriptions Current Discharge Medication List  Davonna Belling, MD 11/30/16 2351

## 2016-11-30 NOTE — ED Notes (Signed)
Cardiologist at the bedside

## 2016-11-30 NOTE — ED Triage Notes (Signed)
Pt here from her cardiologist's office for increased sob and L sided chest pain (she was originally there for pre-calth labs).  EKG nsr, vs wnl.  96% on 4L (Pt normally on 2L, but had sats of 90% on 2L)

## 2016-12-01 DIAGNOSIS — I5033 Acute on chronic diastolic (congestive) heart failure: Secondary | ICD-10-CM

## 2016-12-01 LAB — CBC
HEMATOCRIT: 34.5 % (ref 34.0–46.6)
HEMATOCRIT: 33.1 % — AB (ref 36.0–46.0)
HEMOGLOBIN: 11.8 g/dL (ref 11.1–15.9)
HEMOGLOBIN: 11.1 g/dL — AB (ref 12.0–15.0)
MCH: 28.1 pg (ref 26.6–33.0)
MCH: 27.5 pg (ref 26.0–34.0)
MCHC: 34.2 g/dL (ref 31.5–35.7)
MCHC: 33.5 g/dL (ref 30.0–36.0)
MCV: 82 fL (ref 79–97)
MCV: 81.9 fL (ref 78.0–100.0)
PLATELETS: 254 10*3/uL (ref 150–379)
Platelets: 225 10*3/uL (ref 150–400)
RBC: 4.2 x10E6/uL (ref 3.77–5.28)
RBC: 4.04 MIL/uL (ref 3.87–5.11)
RDW: 15.5 % — ABNORMAL HIGH (ref 12.3–15.4)
RDW: 14.9 % (ref 11.5–15.5)
WBC: 5.1 10*3/uL (ref 3.4–10.8)
WBC: 5 10*3/uL (ref 4.0–10.5)

## 2016-12-01 LAB — GLUCOSE, CAPILLARY
GLUCOSE-CAPILLARY: 126 mg/dL — AB (ref 65–99)
Glucose-Capillary: 155 mg/dL — ABNORMAL HIGH (ref 65–99)
Glucose-Capillary: 110 mg/dL — ABNORMAL HIGH (ref 65–99)
Glucose-Capillary: 107 mg/dL — ABNORMAL HIGH (ref 65–99)

## 2016-12-01 LAB — LIPID PANEL
CHOLESTEROL: 119 mg/dL (ref 0–200)
HDL: 34 mg/dL — ABNORMAL LOW (ref >40)
LDL Cholesterol: 43 mg/dL (ref 0–99)
TRIGLYCERIDES: 212 mg/dL — AB (ref <150)
Total CHOL/HDL Ratio: 3.5 RATIO
VLDL: 42 mg/dL — ABNORMAL HIGH (ref 0–40)

## 2016-12-01 LAB — BASIC METABOLIC PANEL
ANION GAP: 8 (ref 5–15)
BUN/Creatinine Ratio: 15 (ref 9–23)
BUN: 16 mg/dL (ref 6–24)
BUN: 15 mg/dL (ref 6–20)
CALCIUM: 9.7 mg/dL (ref 8.7–10.2)
CHLORIDE: 97 mmol/L (ref 96–106)
CHLORIDE: 103 mmol/L (ref 101–111)
CO2: 26 mmol/L (ref 18–29)
CO2: 30 mmol/L (ref 22–32)
CREATININE: 1.07 mg/dL — AB (ref 0.57–1.00)
Calcium: 9.2 mg/dL (ref 8.9–10.3)
Creatinine, Ser: 1.1 mg/dL — ABNORMAL HIGH (ref 0.44–1.00)
GFR calc Af Amer: >60 mL/min (ref >60)
GFR calc non Af Amer: 57 mL/min/{1.73_m2} — ABNORMAL LOW (ref >59)
GFR, EST AFRICAN AMERICAN: 66 mL/min/{1.73_m2} (ref >59)
GFR, EST NON AFRICAN AMERICAN: 54 mL/min — AB (ref >60)
GLUCOSE: 141 mg/dL — AB (ref 65–99)
Glucose: 97 mg/dL (ref 65–99)
POTASSIUM: 3.2 mmol/L — AB (ref 3.5–5.1)
Potassium: 3.7 mmol/L (ref 3.5–5.2)
Sodium: 141 mmol/L (ref 134–144)
Sodium: 141 mmol/L (ref 135–145)

## 2016-12-01 LAB — MRSA PCR SCREENING: MRSA by PCR: POSITIVE — AB

## 2016-12-01 MED ORDER — SPIRONOLACTONE 25 MG PO TABS
12.5000 mg | ORAL_TABLET | Freq: Every day | ORAL | Status: DC
  Administered 2016-12-01 – 2016-12-04 (×4): 12.5 mg via ORAL
  Filled 2016-12-01 (×4): qty 1

## 2016-12-01 MED ORDER — POTASSIUM CHLORIDE CRYS ER 20 MEQ PO TBCR
40.0000 meq | EXTENDED_RELEASE_TABLET | Freq: Once | ORAL | Status: AC
Start: 2016-12-01 — End: 2016-12-01
  Administered 2016-12-01: 40 meq via ORAL
  Filled 2016-12-01: qty 2

## 2016-12-01 MED ORDER — CHLORHEXIDINE GLUCONATE CLOTH 2 % EX PADS
6.0000 | MEDICATED_PAD | Freq: Every day | CUTANEOUS | Status: DC
  Administered 2016-12-01 – 2016-12-04 (×4): 6 via TOPICAL

## 2016-12-01 MED ORDER — FUROSEMIDE 40 MG PO TABS
40.0000 mg | ORAL_TABLET | Freq: Every day | ORAL | Status: DC
  Administered 2016-12-01 – 2016-12-04 (×4): 40 mg via ORAL
  Filled 2016-12-01 (×4): qty 1

## 2016-12-01 MED ORDER — MUPIROCIN 2 % EX OINT
1.0000 "application " | TOPICAL_OINTMENT | Freq: Two times a day (BID) | CUTANEOUS | Status: DC
  Administered 2016-12-01 – 2016-12-04 (×8): 1 via NASAL
  Filled 2016-12-01 (×2): qty 22

## 2016-12-01 NOTE — Progress Notes (Signed)
Progress Note  Patient Name: Stephanie Schultz Date of Encounter: 12/01/2016  Primary Cardiologist: Radford Pax  Subjective   Breathing better after inhalers and one dose IV lasix . "I feel better after I got the fluid off"  No further CP. Troponins negative.   Inpatient Medications    Scheduled Meds: . allopurinol  100 mg Oral Daily  . amLODipine  2.5 mg Oral Daily  . aspirin EC  81 mg Oral Daily  . Chlorhexidine Gluconate Cloth  6 each Topical Q0600  . fluticasone  1 spray Each Nare Daily  . furosemide  40 mg Intravenous BID  . glipiZIDE  10 mg Oral BID AC  . heparin  5,000 Units Subcutaneous Q8H  . insulin glargine  14 Units Subcutaneous QHS  . ipratropium-albuterol  3 mL Nebulization TID  . linagliptin  5 mg Oral Daily  . loratadine  10 mg Oral Daily  . lubiprostone  24 mcg Oral Q breakfast  . mouth rinse  15 mL Mouth Rinse BID  . mometasone-formoterol  2 puff Inhalation BID  . mupirocin ointment  1 application Nasal BID  . pantoprazole  40 mg Oral Q1200  . pravastatin  20 mg Oral q1800  . pregabalin  75 mg Oral BID  . sodium chloride flush  3 mL Intravenous Q12H  . tiotropium  18 mcg Inhalation Daily  . triamterene-hydrochlorothiazide  1 capsule Oral Daily  . zolpidem  5 mg Oral QHS   Continuous Infusions:  PRN Meds: sodium chloride, acetaminophen, ALPRAZolam, HYDROcodone-acetaminophen, nitroGLYCERIN, ondansetron, sodium chloride, sodium chloride flush   Vital Signs    Vitals:   12/01/16 0913 12/01/16 0914 12/01/16 0917 12/01/16 1140  BP:      Pulse:      Resp:      Temp:    97.8 F (36.6 C)  TempSrc:    Oral  SpO2: 97% 96% 98%   Weight:      Height:        Intake/Output Summary (Last 24 hours) at 12/01/16 1200 Last data filed at 12/01/16 1000  Gross per 24 hour  Intake                0 ml  Output             1200 ml  Net            -1200 ml   Filed Weights   11/30/16 1410 11/30/16 2026 12/01/16 0324  Weight: 101.6 kg (224 lb) 100.3 kg (221 lb 3.2 oz)  99.1 kg (218 lb 6.4 oz)    Telemetry    NSR - Personally Reviewed  ECG    NSR 81. Mild non-specific ST scooping - Personally Reviewed  Physical Exam   Vitals:   12/01/16 0813 12/01/16 1140  BP: (!) 106/93   Pulse:    Resp:    Temp: 97.5 F (36.4 C) 97.8 F (36.6 C)    General appearance: obese woman lying in bed NAD Neck: no carotid bruit and no JVD Lungs: diminished breath sounds bilaterally no wheezes Heart: regular rate and rhythm Abdomen: soft, non-tender; bowel sounds normal; no masses,  no organomegaly and obese Extremities: extremities normal, atraumatic, no cyanosis or edema Pulses: 2+ and symmetric Skin: Skin color, texture, turgor normal. No rashes or lesions Neurologic: Grossly normal Psych: Mildly anxious  Labs    Chemistry Recent Labs Lab 11/30/16 1301 11/30/16 1432 12/01/16 0205  NA 141 142 141  K 3.7 3.4* 3.2*  CL 97 103  103  CO2 26 28 30   GLUCOSE 97 87 141*  BUN 16 13 15   CREATININE 1.07* 1.06* 1.10*  CALCIUM 9.7 9.7 9.2  GFRNONAA 57* 57* 54*  GFRAA 66 >60 >60  ANIONGAP  --  11 8     Hematology Recent Labs Lab 11/30/16 1301 11/30/16 1432 12/01/16 0205  WBC 5.1 5.4 5.0  RBC 4.20 4.24 4.04  HGB  --  11.9* 11.1*  HCT 34.5 34.5* 33.1*  MCV 82 81.4 81.9  MCH 28.1 28.1 27.5  MCHC 34.2 34.5 33.5  RDW 15.5* 14.9 14.9  PLT 254 225 225    Cardiac EnzymesNo results for input(s): TROPONINI in the last 168 hours.  Recent Labs Lab 11/30/16 1437  TROPIPOC 0.00     BNP Recent Labs Lab 11/30/16 1941  BNP 9.8     DDimer No results for input(s): DDIMER in the last 168 hours.   Radiology    Dg Chest 2 View  Result Date: 11/30/2016 CLINICAL DATA:  Left-sided chest pain.  Shortness breath. EXAM: CHEST  2 VIEW COMPARISON:  Two-view chest x-ray 08/10/2016. FINDINGS: The heart size is normal. Aortic atherosclerosis is present. Interstitial prominence has increased. Linear density is present at the right base. Remote left-sided rib  fractures are present. IMPRESSION: 1. Increase and interstitial prominence may reflect mild edema. 2. Linear density at the right base likely reflects atelectasis. 3. Aortic atherosclerosis. Electronically Signed   By: San Morelle M.D.   On: 11/30/2016 14:55    Cardiac Studies   pending  Patient Profile     61 y.o. female with morbid obesity, DM2, HTN, COPD on chronic O2, OSA on CPAP, prior tobacco abuse. LHC at [facility] in [place], [place] 09/2008: Normal coronary arteries EF 70%. Echo 12/16/15: Mod LVH, EF 60-65%, normal wall motion, grade 1DD. Admitted by Dr. Debara Pickett on 1/19 with worsening dyspnea and hypoxia as well as left-sided chest pain. Felt to have COPD flare and possible angina  Assessment & Plan    1. Chest pain, possible unstable angina - progressive worsening of central chest pain over the past 2 weeks. Was scheduled for an elective cath next Friday - this has been moved up to Monday. Troponins are normal.       --suspect symptoms mainly due to mild volume overload +/- COPD. Improved with diuresis and nebs      --given CRFs with plan cath Monday as scheduled      --continue ASA, statin. No heparin for now 2. COPD - she is also had progressive dyspnea and productive cough with increased oxygen requirement and more sputum production suggestive of acute exacerbation of COPD. BNP 10.        --symptomatically improved with nebulizer and IV lasix 3. Tobacco abuse - stopped smoking 12/16 4. Type 2 diabetes - continue home therapies. Needs weight loss 5. GERD - continue home therapies, add H2 blocker and GI cocktail 6. Hypokalemia - will supp 7. 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 Improved with lasix. Resume po lasix will use 40 daily instead of 20 bid.  Will add spiro. 8.   OSA       -wears nasal pillows at home. Can't tolerate mask.   Can go to tele. Plan cath Monday. Treat COPD flare.   Treasa School, MD    12/01/2016, 12:00 PM

## 2016-12-02 LAB — GLUCOSE, CAPILLARY
GLUCOSE-CAPILLARY: 108 mg/dL — AB (ref 65–99)
GLUCOSE-CAPILLARY: 131 mg/dL — AB (ref 65–99)
Glucose-Capillary: 125 mg/dL — ABNORMAL HIGH (ref 65–99)
Glucose-Capillary: 121 mg/dL — ABNORMAL HIGH (ref 65–99)

## 2016-12-02 NOTE — Plan of Care (Signed)
Problem: Safety: Goal: Ability to remain free from injury will improve Outcome: Completed/Met Date Met: 12/02/16 Patient knows where and how to use her call light and is aware that she can use the number on the white board to reach her RN/NT as needed and uses appropriately, bedside stand with patient's belongings are within her reach, will continue to monitor.

## 2016-12-02 NOTE — Progress Notes (Signed)
Report received in patient's room via Costella Hatcher RN using [employer], reviewed VS, labs, Tests and patient's general condition, assumed care of patient.

## 2016-12-02 NOTE — Progress Notes (Addendum)
Progress Note  Patient Name: Stephanie Schultz Date of Encounter: 12/02/2016  Primary Cardiologist: Radford Pax  Subjective   No further CP. Troponins negative. Breathing at baseline. Mild left forehead headache.   Inpatient Medications    Scheduled Meds: . allopurinol  100 mg Oral Daily  . amLODipine  2.5 mg Oral Daily  . aspirin EC  81 mg Oral Daily  . Chlorhexidine Gluconate Cloth  6 each Topical Q0600  . fluticasone  1 spray Each Nare Daily  . furosemide  40 mg Oral Daily  . glipiZIDE  10 mg Oral BID AC  . heparin  5,000 Units Subcutaneous Q8H  . insulin glargine  14 Units Subcutaneous QHS  . ipratropium-albuterol  3 mL Nebulization TID  . linagliptin  5 mg Oral Daily  . loratadine  10 mg Oral Daily  . lubiprostone  24 mcg Oral Q breakfast  . mouth rinse  15 mL Mouth Rinse BID  . mometasone-formoterol  2 puff Inhalation BID  . mupirocin ointment  1 application Nasal BID  . pantoprazole  40 mg Oral Q1200  . pravastatin  20 mg Oral q1800  . pregabalin  75 mg Oral BID  . sodium chloride flush  3 mL Intravenous Q12H  . spironolactone  12.5 mg Oral Daily  . tiotropium  18 mcg Inhalation Daily  . triamterene-hydrochlorothiazide  1 capsule Oral Daily  . zolpidem  5 mg Oral QHS   Continuous Infusions:  PRN Meds: sodium chloride, acetaminophen, ALPRAZolam, HYDROcodone-acetaminophen, nitroGLYCERIN, ondansetron, sodium chloride, sodium chloride flush   Vital Signs    Vitals:   12/01/16 2052 12/01/16 2100 12/02/16 0500 12/02/16 0815  BP:  107/66 (!) 99/55   Pulse:  79 81   Resp:  16 (!) 35   Temp:  97.8 F (36.6 C) 98 F (36.7 C)   TempSrc:      SpO2: 98% 94% 100% 97%  Weight:   220 lb 4.8 oz (99.9 kg)   Height:        Intake/Output Summary (Last 24 hours) at 12/02/16 0846 Last data filed at 12/02/16 0500  Gross per 24 hour  Intake              300 ml  Output              800 ml  Net             -500 ml   Filed Weights   11/30/16 2026 12/01/16 0324 12/02/16 0500    Weight: 221 lb 3.2 oz (100.3 kg) 218 lb 6.4 oz (99.1 kg) 220 lb 4.8 oz (99.9 kg)    Telemetry    NSR - Personally Reviewed  ECG    NSR 81.  Mild non-specific ST scooping- Personally Reviewed  Physical Exam   Vitals:   12/01/16 2100 12/02/16 0500  BP: 107/66 (!) 99/55  Pulse: 79 81  Resp: 16 (!) 35  Temp: 97.8 F (36.6 C) 98 F (36.7 C)    General appearance: obese woman lying in bed NAD Neck: no carotid bruit and no JVD Lungs: diminished breath sounds bilaterally no wheezes Heart: regular rate and rhythm Abdomen: soft, non-tender; bowel sounds normal; no masses,  no organomegaly and obese Extremities: extremities normal, atraumatic, no cyanosis or edema Pulses: 2+ and symmetric Skin: Skin color, texture, turgor normal. No rashes or lesions Neurologic: Grossly normal Psych: Mildly anxious  Labs    Chemistry  Recent Labs Lab 11/30/16 1301 11/30/16 1432 12/01/16 0205  NA 141 142  141  K 3.7 3.4* 3.2*  CL 97 103 103  CO2 26 28 30   GLUCOSE 97 87 141*  BUN 16 13 15   CREATININE 1.07* 1.06* 1.10*  CALCIUM 9.7 9.7 9.2  GFRNONAA 57* 57* 54*  GFRAA 66 >60 >60  ANIONGAP  --  11 8     Hematology  Recent Labs Lab 11/30/16 1301 11/30/16 1432 12/01/16 0205  WBC 5.1 5.4 5.0  RBC 4.20 4.24 4.04  HGB  --  11.9* 11.1*  HCT 34.5 34.5* 33.1*  MCV 82 81.4 81.9  MCH 28.1 28.1 27.5  MCHC 34.2 34.5 33.5  RDW 15.5* 14.9 14.9  PLT 254 225 225    Cardiac EnzymesNo results for input(s): TROPONINI in the last 168 hours.   Recent Labs Lab 11/30/16 1437  TROPIPOC 0.00     BNP  Recent Labs Lab 11/30/16 1941  BNP 9.8     DDimer No results for input(s): DDIMER in the last 168 hours.   Radiology    Dg Chest 2 View  Result Date: 11/30/2016 CLINICAL DATA:  Left-sided chest pain.  Shortness breath. EXAM: CHEST  2 VIEW COMPARISON:  Two-view chest x-ray 08/10/2016. FINDINGS: The heart size is normal. Aortic atherosclerosis is present. Interstitial prominence has  increased. Linear density is present at the right base. Remote left-sided rib fractures are present. IMPRESSION: 1. Increase and interstitial prominence may reflect mild edema. 2. Linear density at the right base likely reflects atelectasis. 3. Aortic atherosclerosis. Electronically Signed   By: San Morelle M.D.   On: 11/30/2016 14:55    Cardiac Studies   Pending cath monday  Patient Profile     61 y.o. female with morbid obesity, DM2, HTN, COPD on chronic O2, OSA on CPAP, prior tobacco abuse. LHC at [facility] in [place], [place] 09/2008: Normal coronary arteries EF 70%. Echo 12/16/15: Mod LVH, EF 60-65%, normal wall motion, grade 1DD. Admitted by Dr. Debara Pickett on 1/19 with worsening dyspnea and hypoxia as well as left-sided chest pain. Felt to have COPD flare and possible angina  Assessment & Plan    1. Chest pain, possible unstable angina - progressive worsening of central chest pain over the past 2 weeks. Was scheduled for an elective cath next Friday - this has been moved up to Monday. Troponins are normal.       --suspect symptoms mainly due to mild volume overload +/- COPD. Improved with diuresis and nebs      --given CRFs with plan cath Monday as scheduled      --continue ASA, statin. No heparin for now 2. COPD - she is also had progressive dyspnea and productive cough with increased oxygen requirement and more sputum production suggestive of acute exacerbation of COPD. BNP 10.        --symptomatically improved with nebulizer and IV lasix 3. Tobacco abuse - stopped smoking 12/16 4. Type 2 diabetes - continue home therapies. Needs weight loss 5. GERD - continue home therapies, add H2 blocker and GI cocktail 6. Hypokalemia - will supp 7. 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 Improved with lasix. Resume po lasix will use 40 daily instead of 20 bid.  Added spiro. BP a bit low, will stop amlodipine 2.5. 8.   OSA       -wears nasal  pillows at home. Can't tolerate mask.    Plan cath Monday.    Signed, Candee Furbish, MD  12/02/2016,  8:46 AM

## 2016-12-03 ENCOUNTER — Encounter (HOSPITAL_COMMUNITY)
Admission: EM | Disposition: A | Discharge status: Home / Self Care | Payer: Self-pay | Source: Home / Self Care | Attending: Internal Medicine

## 2016-12-03 HISTORY — PX: CARDIAC CATHETERIZATION: SHX172

## 2016-12-03 LAB — BASIC METABOLIC PANEL
Anion gap: 11 (ref 5–15)
BUN: 16 mg/dL (ref 6–20)
CHLORIDE: 103 mmol/L (ref 101–111)
CO2: 26 mmol/L (ref 22–32)
Calcium: 9.1 mg/dL (ref 8.9–10.3)
Creatinine, Ser: 1.03 mg/dL — ABNORMAL HIGH (ref 0.44–1.00)
GFR calc Af Amer: >60 mL/min (ref >60)
GFR calc non Af Amer: 59 mL/min — ABNORMAL LOW (ref >60)
Glucose, Bld: 81 mg/dL (ref 65–99)
POTASSIUM: 3.6 mmol/L (ref 3.5–5.1)
SODIUM: 140 mmol/L (ref 135–145)

## 2016-12-03 LAB — GLUCOSE, CAPILLARY
GLUCOSE-CAPILLARY: 125 mg/dL — AB (ref 65–99)
GLUCOSE-CAPILLARY: 196 mg/dL — AB (ref 65–99)
Glucose-Capillary: 89 mg/dL (ref 65–99)
Glucose-Capillary: 80 mg/dL (ref 65–99)

## 2016-12-03 LAB — PROTIME-INR
INR: 0.98
PROTHROMBIN TIME: 12.9 s (ref 11.4–15.2)

## 2016-12-03 SURGERY — LEFT HEART CATH AND CORONARY ANGIOGRAPHY

## 2016-12-03 MED ORDER — MIDAZOLAM HCL 2 MG/2ML IJ SOLN
INTRAMUSCULAR | Status: DC | PRN
  Administered 2016-12-03 (×2): 1 mg via INTRAVENOUS

## 2016-12-03 MED ORDER — MIDAZOLAM HCL 2 MG/2ML IJ SOLN
INTRAMUSCULAR | Status: AC
  Filled 2016-12-03: qty 2

## 2016-12-03 MED ORDER — SODIUM CHLORIDE 0.9% FLUSH: 3.0000 mL | Freq: Two times a day (BID) | INTRAVENOUS | Status: DC

## 2016-12-03 MED ORDER — VERAPAMIL HCL 2.5 MG/ML IV SOLN
INTRAVENOUS | Status: DC | PRN
  Administered 2016-12-03: 8 mL via INTRA_ARTERIAL

## 2016-12-03 MED ORDER — SODIUM CHLORIDE 0.9% FLUSH: 3.0000 mL | INTRAVENOUS | Status: DC | PRN

## 2016-12-03 MED ORDER — HEPARIN SODIUM (PORCINE) 1000 UNIT/ML IJ SOLN
INTRAMUSCULAR | Status: AC
  Filled 2016-12-03: qty 1

## 2016-12-03 MED ORDER — POTASSIUM CHLORIDE CRYS ER 20 MEQ PO TBCR
20.0000 meq | EXTENDED_RELEASE_TABLET | Freq: Two times a day (BID) | ORAL | Status: AC
  Administered 2016-12-03 (×2): 20 meq via ORAL
  Filled 2016-12-03 (×2): qty 1

## 2016-12-03 MED ORDER — HEPARIN (PORCINE) IN NACL 2-0.9 UNIT/ML-% IJ SOLN
INTRAMUSCULAR | Status: AC
  Filled 2016-12-03: qty 1000

## 2016-12-03 MED ORDER — SODIUM CHLORIDE 0.9 % IV SOLN
250.0000 mL | INTRAVENOUS | Status: DC | PRN
Start: 2016-12-03 — End: 2016-12-04

## 2016-12-03 MED ORDER — LIDOCAINE HCL (PF) 1 % IJ SOLN
INTRAMUSCULAR | Status: DC | PRN
  Administered 2016-12-03: 3 mL

## 2016-12-03 MED ORDER — HEPARIN SODIUM (PORCINE) 1000 UNIT/ML IJ SOLN
INTRAMUSCULAR | Status: DC | PRN
  Administered 2016-12-03: 5000 [IU] via INTRAVENOUS

## 2016-12-03 MED ORDER — FENTANYL CITRATE (PF) 100 MCG/2ML IJ SOLN
INTRAMUSCULAR | Status: AC
  Filled 2016-12-03: qty 2

## 2016-12-03 MED ORDER — IOPAMIDOL (ISOVUE-370) INJECTION 76%
INTRAVENOUS | Status: AC
  Filled 2016-12-03: qty 100

## 2016-12-03 MED ORDER — VERAPAMIL HCL 2.5 MG/ML IV SOLN
INTRAVENOUS | Status: AC
  Filled 2016-12-03: qty 2

## 2016-12-03 MED ORDER — HEPARIN SODIUM (PORCINE) 5000 UNIT/ML IJ SOLN
5000.0000 [IU] | Freq: Three times a day (TID) | INTRAMUSCULAR | Status: DC
  Administered 2016-12-04: 5000 [IU] via SUBCUTANEOUS
  Filled 2016-12-03: qty 1

## 2016-12-03 MED ORDER — ALPRAZOLAM 0.25 MG PO TABS: 0.2500 mg | ORAL_TABLET | Freq: Three times a day (TID) | ORAL | Status: DC | PRN

## 2016-12-03 MED ORDER — SODIUM CHLORIDE 0.9 % IV SOLN
INTRAVENOUS | Status: DC
  Administered 2016-12-03: 09:00:00 via INTRAVENOUS

## 2016-12-03 MED ORDER — SODIUM CHLORIDE 0.9 % IV SOLN: 250.0000 mL | INTRAVENOUS | Status: DC | PRN

## 2016-12-03 MED ORDER — SODIUM CHLORIDE 0.9 % WEIGHT BASED INFUSION: 1.0000 mL/kg/h | INTRAVENOUS | Status: AC

## 2016-12-03 MED ORDER — IOPAMIDOL (ISOVUE-370) INJECTION 76%
INTRAVENOUS | Status: DC | PRN
  Administered 2016-12-03: 60 mL via INTRA_ARTERIAL

## 2016-12-03 MED ORDER — FENTANYL CITRATE (PF) 100 MCG/2ML IJ SOLN
INTRAMUSCULAR | Status: DC | PRN
  Administered 2016-12-03 (×2): 25 ug via INTRAVENOUS

## 2016-12-03 MED ORDER — SODIUM CHLORIDE 0.9% FLUSH
3.0000 mL | Freq: Two times a day (BID) | INTRAVENOUS | Status: DC
  Administered 2016-12-03: 3 mL via INTRAVENOUS

## 2016-12-03 MED ORDER — HEPARIN (PORCINE) IN NACL 2-0.9 UNIT/ML-% IJ SOLN
INTRAMUSCULAR | Status: DC | PRN
  Administered 2016-12-03: 1000 mL

## 2016-12-03 MED ORDER — LIDOCAINE HCL (PF) 1 % IJ SOLN
INTRAMUSCULAR | Status: AC
  Filled 2016-12-03: qty 30

## 2016-12-03 SURGICAL SUPPLY — 10 items
CATH 5FR JL3.5 JR4 ANG PIG MP (CATHETERS) ×3 IMPLANT
DEVICE RAD COMP TR BAND LRG (VASCULAR PRODUCTS) ×3 IMPLANT
GLIDESHEATH SLEND SS 6F .021 (SHEATH) ×6 IMPLANT
GUIDEWIRE INQWIRE 1.5J.035X260 (WIRE) ×1 IMPLANT
INQWIRE 1.5J .035X260CM (WIRE) ×3
KIT HEART LEFT (KITS) ×3 IMPLANT
PACK CARDIAC CATHETERIZATION (CUSTOM PROCEDURE TRAY) ×3 IMPLANT
SYR MEDRAD MARK V 150ML (SYRINGE) ×3 IMPLANT
TRANSDUCER W/STOPCOCK (MISCELLANEOUS) ×3 IMPLANT
TUBING CIL FLEX 10 FLL-RA (TUBING) ×3 IMPLANT

## 2016-12-03 NOTE — Interval H&P Note (Signed)
History and Physical Interval Note:  12/03/2016 3:44 PM  Stephanie Schultz  has presented today for surgery, with the diagnosis of cp  The various methods of treatment have been discussed with the patient and family. After consideration of risks, benefits and other options for treatment, the patient has consented to  Procedure(s): Left Heart Cath and Coronary Angiography (N/A) as a surgical intervention .  The patient's history has been reviewed, patient examined, no change in status, stable for surgery.  I have reviewed the patient's chart and labs.  Questions were answered to the patient's satisfaction.    Cath Lab Visit (complete for each Cath Lab visit)  Clinical Evaluation Leading to the Procedure:   ACS: Yes.    Non-ACS:    Anginal Classification: CCS III  Anti-ischemic medical therapy: Minimal Therapy (1 class of medications)  Non-Invasive Test Results: No non-invasive testing performed  Prior CABG: No previous CABG       Stephanie Schultz 12/03/2016 3:44 PM

## 2016-12-03 NOTE — Progress Notes (Signed)
Progress Note  Patient Name: Stephanie Schultz Date of Encounter: 12/03/2016  Primary Cardiologist: Dr. Radford Pax  Subjective   Feeling anxious about cath today. Denies chest pain and SOB.   Inpatient Medications    Scheduled Meds: . allopurinol  100 mg Oral Daily  . aspirin EC  81 mg Oral Daily  . Chlorhexidine Gluconate Cloth  6 each Topical Q0600  . fluticasone  1 spray Each Nare Daily  . furosemide  40 mg Oral Daily  . glipiZIDE  10 mg Oral BID AC  . heparin  5,000 Units Subcutaneous Q8H  . insulin glargine  14 Units Subcutaneous QHS  . ipratropium-albuterol  3 mL Nebulization TID  . linagliptin  5 mg Oral Daily  . loratadine  10 mg Oral Daily  . lubiprostone  24 mcg Oral Q breakfast  . mouth rinse  15 mL Mouth Rinse BID  . mometasone-formoterol  2 puff Inhalation BID  . mupirocin ointment  1 application Nasal BID  . pantoprazole  40 mg Oral Q1200  . pravastatin  20 mg Oral q1800  . pregabalin  75 mg Oral BID  . sodium chloride flush  3 mL Intravenous Q12H  . spironolactone  12.5 mg Oral Daily  . tiotropium  18 mcg Inhalation Daily  . triamterene-hydrochlorothiazide  1 capsule Oral Daily  . zolpidem  5 mg Oral QHS   Continuous Infusions:  PRN Meds: sodium chloride, acetaminophen, ALPRAZolam, HYDROcodone-acetaminophen, nitroGLYCERIN, ondansetron, sodium chloride, sodium chloride flush   Vital Signs    Vitals:   12/02/16 1416 12/02/16 2052 12/02/16 2055 12/03/16 0500  BP:   128/73 (!) 101/52  Pulse:   97 76  Resp:   (!) 28 (!) 27  Temp:   98 F (36.7 C) 98.1 F (36.7 C)  TempSrc:      SpO2: 94% 96% 98% 97%  Weight:    220 lb (99.8 kg)  Height:        Intake/Output Summary (Last 24 hours) at 12/03/16 0744 Last data filed at 12/03/16 0500  Gross per 24 hour  Intake              720 ml  Output              850 ml  Net             -130 ml   Filed Weights   12/01/16 0324 12/02/16 0500 12/03/16 0500  Weight: 218 lb 6.4 oz (99.1 kg) 220 lb 4.8 oz (99.9 kg)  220 lb (99.8 kg)    Telemetry    NSR - Personally Reviewed  ECG     NSR- Personally Reviewed  Physical Exam   GEN: No acute distress.  Neck: No JVD Cardiac: RRR, no murmurs, rubs, or gallops.  Respiratory: Clear to auscultation bilaterally. GI: Soft, nontender, non-distended  MS: No edema; No deformity. Neuro:  AAOx3. Psych: Normal affect  Labs    Chemistry Recent Labs Lab 11/30/16 1301 11/30/16 1432 12/01/16 0205  NA 141 142 141  K 3.7 3.4* 3.2*  CL 97 103 103  CO2 26 28 30   GLUCOSE 97 87 141*  BUN 16 13 15   CREATININE 1.07* 1.06* 1.10*  CALCIUM 9.7 9.7 9.2  GFRNONAA 57* 57* 54*  GFRAA 66 >60 >60  ANIONGAP  --  11 8     Hematology Recent Labs Lab 11/30/16 1301 11/30/16 1432 12/01/16 0205  WBC 5.1 5.4 5.0  RBC 4.20 4.24 4.04  HGB  --  11.9* 11.1*  HCT 34.5 34.5* 33.1*  MCV 82 81.4 81.9  MCH 28.1 28.1 27.5  MCHC 34.2 34.5 33.5  RDW 15.5* 14.9 14.9  PLT 254 225 225     Recent Labs Lab 11/30/16 1437  TROPIPOC 0.00     BNP Recent Labs Lab 11/30/16 1941  BNP 9.8       Radiology    No results found.    Patient Profile     61 y.o. female with a past medical history of morbid obesity, DM2, HTN, COPD on chronic O2, OSA on CPAP, prior tobacco abuse. LHC at [facility] in [place], [place] 09/2008: Normal coronary arteries EF 70%. Echo 12/16/15: Mod LVH, EF 60-65%, normal wall motion, grade 1DD. Admitted by Dr. Debara Schultz on 1/19 with worsening dyspnea and hypoxia as well as left-sided chest pain. Felt to have COPD flare and possible angina  Assessment & Plan    1. Chest pain, possible unstable angina: Progressive worsening of cental chest pain over the past 2 weeks. Was scheduled for an elective cath, which was moved up to today. Troponins negative.   2. COPD: she is also had progressive dyspnea and productive cough with increased oxygen requirement and more sputum production suggestive of acute exacerbation of COPD. BNP 10.   3. History of tobacco  abuse: quit smoking Dec. 2016.   4. DM: last A1c was 8.2. Manages with her PCP.   5. Acute on chronic diastolic CHF: BNP was low but may be false negative due to obesity. Improved with lasix. Resume po lasix will use 40 daily instead of 20 bid.  Added spiro.   6. Hypokalemia: K is 3.2 this am, orders placed for repletion.   Signed, Stephanie Leas, NP  12/03/2016, 7:44 AM   Patient seen and examined and history reviewed. Agree with above findings and plan. Patient is feeling better. Less SOB. No chest pain at rest. troponins all negative. Plan for cardiac cath today ( patient was not on schedule- I have added). The procedure and risks were reviewed including but not limited to death, myocardial infarction, stroke, arrythmias, bleeding, transfusion, emergency surgery, dye allergy, or renal dysfunction. The patient voices understanding and is agreeable to proceed.Marland Kitchen  Stephanie Schultz, Stephanie Schultz 12/03/2016 10:39 AM

## 2016-12-03 NOTE — H&P (View-Only) (Signed)
Progress Note  Patient Name: Stephanie Schultz Date of Encounter: 12/03/2016  Primary Cardiologist: Dr. Radford Pax  Subjective   Feeling anxious about cath today. Denies chest pain and SOB.   Inpatient Medications    Scheduled Meds: . allopurinol  100 mg Oral Daily  . aspirin EC  81 mg Oral Daily  . Chlorhexidine Gluconate Cloth  6 each Topical Q0600  . fluticasone  1 spray Each Nare Daily  . furosemide  40 mg Oral Daily  . glipiZIDE  10 mg Oral BID AC  . heparin  5,000 Units Subcutaneous Q8H  . insulin glargine  14 Units Subcutaneous QHS  . ipratropium-albuterol  3 mL Nebulization TID  . linagliptin  5 mg Oral Daily  . loratadine  10 mg Oral Daily  . lubiprostone  24 mcg Oral Q breakfast  . mouth rinse  15 mL Mouth Rinse BID  . mometasone-formoterol  2 puff Inhalation BID  . mupirocin ointment  1 application Nasal BID  . pantoprazole  40 mg Oral Q1200  . pravastatin  20 mg Oral q1800  . pregabalin  75 mg Oral BID  . sodium chloride flush  3 mL Intravenous Q12H  . spironolactone  12.5 mg Oral Daily  . tiotropium  18 mcg Inhalation Daily  . triamterene-hydrochlorothiazide  1 capsule Oral Daily  . zolpidem  5 mg Oral QHS   Continuous Infusions:  PRN Meds: sodium chloride, acetaminophen, ALPRAZolam, HYDROcodone-acetaminophen, nitroGLYCERIN, ondansetron, sodium chloride, sodium chloride flush   Vital Signs    Vitals:   12/02/16 1416 12/02/16 2052 12/02/16 2055 12/03/16 0500  BP:   128/73 (!) 101/52  Pulse:   97 76  Resp:   (!) 28 (!) 27  Temp:   98 F (36.7 C) 98.1 F (36.7 C)  TempSrc:      SpO2: 94% 96% 98% 97%  Weight:    220 lb (99.8 kg)  Height:        Intake/Output Summary (Last 24 hours) at 12/03/16 0744 Last data filed at 12/03/16 0500  Gross per 24 hour  Intake              720 ml  Output              850 ml  Net             -130 ml   Filed Weights   12/01/16 0324 12/02/16 0500 12/03/16 0500  Weight: 218 lb 6.4 oz (99.1 kg) 220 lb 4.8 oz (99.9 kg)  220 lb (99.8 kg)    Telemetry    NSR - Personally Reviewed  ECG     NSR- Personally Reviewed  Physical Exam   GEN: No acute distress.  Neck: No JVD Cardiac: RRR, no murmurs, rubs, or gallops.  Respiratory: Clear to auscultation bilaterally. GI: Soft, nontender, non-distended  MS: No edema; No deformity. Neuro:  AAOx3. Psych: Normal affect  Labs    Chemistry Recent Labs Lab 11/30/16 1301 11/30/16 1432 12/01/16 0205  NA 141 142 141  K 3.7 3.4* 3.2*  CL 97 103 103  CO2 26 28 30   GLUCOSE 97 87 141*  BUN 16 13 15   CREATININE 1.07* 1.06* 1.10*  CALCIUM 9.7 9.7 9.2  GFRNONAA 57* 57* 54*  GFRAA 66 >60 >60  ANIONGAP  --  11 8     Hematology Recent Labs Lab 11/30/16 1301 11/30/16 1432 12/01/16 0205  WBC 5.1 5.4 5.0  RBC 4.20 4.24 4.04  HGB  --  11.9* 11.1*  HCT 34.5 34.5* 33.1*  MCV 82 81.4 81.9  MCH 28.1 28.1 27.5  MCHC 34.2 34.5 33.5  RDW 15.5* 14.9 14.9  PLT 254 225 225     Recent Labs Lab 11/30/16 1437  TROPIPOC 0.00     BNP Recent Labs Lab 11/30/16 1941  BNP 9.8       Radiology    No results found.    Patient Profile     61 y.o. female with a past medical history of morbid obesity, DM2, HTN, COPD on chronic O2, OSA on CPAP, prior tobacco abuse. LHC at [facility] in [place], [place] 09/2008: Normal coronary arteries EF 70%. Echo 12/16/15: Mod LVH, EF 60-65%, normal wall motion, grade 1DD. Admitted by Dr. Debara Pickett on 1/19 with worsening dyspnea and hypoxia as well as left-sided chest pain. Felt to have COPD flare and possible angina  Assessment & Plan    1. Chest pain, possible unstable angina: Progressive worsening of cental chest pain over the past 2 weeks. Was scheduled for an elective cath, which was moved up to today. Troponins negative.   2. COPD: she is also had progressive dyspnea and productive cough with increased oxygen requirement and more sputum production suggestive of acute exacerbation of COPD. BNP 10.   3. History of tobacco  abuse: quit smoking Dec. 2016.   4. DM: last A1c was 8.2. Manages with her PCP.   5. Acute on chronic diastolic CHF: BNP was low but may be false negative due to obesity. Improved with lasix. Resume po lasix will use 40 daily instead of 20 bid.  Added spiro.   6. Hypokalemia: K is 3.2 this am, orders placed for repletion.   Signed, Arbutus Leas, NP  12/03/2016, 7:44 AM   Patient seen and examined and history reviewed. Agree with above findings and plan. Patient is feeling better. Less SOB. No chest pain at rest. troponins all negative. Plan for cardiac cath today ( patient was not on schedule- I have added). The procedure and risks were reviewed including but not limited to death, myocardial infarction, stroke, arrythmias, bleeding, transfusion, emergency surgery, dye allergy, or renal dysfunction. The patient voices understanding and is agreeable to proceed.Marland Kitchen  Jenney Brester [place], [place] 12/03/2016 10:39 AM

## 2016-12-03 NOTE — Progress Notes (Signed)
Updated report received in patient's room via Costella Hatcher RN using SBAR format updated on the day's events, assumed care of patient.

## 2016-12-04 ENCOUNTER — Encounter (HOSPITAL_COMMUNITY): Payer: Self-pay | Admitting: Cardiology

## 2016-12-04 LAB — GLUCOSE, CAPILLARY
GLUCOSE-CAPILLARY: 139 mg/dL — AB (ref 65–99)
Glucose-Capillary: 90 mg/dL (ref 65–99)

## 2016-12-04 MED ORDER — FUROSEMIDE 40 MG PO TABS: 40.0000 mg | ORAL_TABLET | Freq: Every day | ORAL | 12 refills | Status: DC

## 2016-12-04 MED ORDER — SPIRONOLACTONE 25 MG PO TABS: 12.5000 mg | ORAL_TABLET | Freq: Every day | ORAL | 12 refills | Status: DC

## 2016-12-04 MED ORDER — POTASSIUM CHLORIDE ER 20 MEQ PO TBCR: 20.0000 meq | EXTENDED_RELEASE_TABLET | Freq: Every day | ORAL | 12 refills | Status: DC

## 2016-12-04 NOTE — Progress Notes (Signed)
Progress Note  Patient Name: Stephanie Schultz Date of Encounter: 12/04/2016  Primary Cardiologist: Dr. Radford Pax  Subjective   Feels great, has been up walking around and denies chest pain.   Inpatient Medications    Scheduled Meds: . allopurinol  100 mg Oral Daily  . aspirin EC  81 mg Oral Daily  . Chlorhexidine Gluconate Cloth  6 each Topical Q0600  . fluticasone  1 spray Each Nare Daily  . furosemide  40 mg Oral Daily  . glipiZIDE  10 mg Oral BID AC  . heparin  5,000 Units Subcutaneous Q8H  . insulin glargine  14 Units Subcutaneous QHS  . ipratropium-albuterol  3 mL Nebulization TID  . linagliptin  5 mg Oral Daily  . loratadine  10 mg Oral Daily  . lubiprostone  24 mcg Oral Q breakfast  . mouth rinse  15 mL Mouth Rinse BID  . mometasone-formoterol  2 puff Inhalation BID  . mupirocin ointment  1 application Nasal BID  . pantoprazole  40 mg Oral Q1200  . pravastatin  20 mg Oral q1800  . pregabalin  75 mg Oral BID  . sodium chloride flush  3 mL Intravenous Q12H  . sodium chloride flush  3 mL Intravenous Q12H  . spironolactone  12.5 mg Oral Daily  . tiotropium  18 mcg Inhalation Daily  . triamterene-hydrochlorothiazide  1 capsule Oral Daily  . zolpidem  5 mg Oral QHS   Continuous Infusions:  PRN Meds: sodium chloride, acetaminophen, ALPRAZolam, HYDROcodone-acetaminophen, nitroGLYCERIN, ondansetron, sodium chloride, sodium chloride flush, sodium chloride flush   Vital Signs    Vitals:   12/03/16 2135 12/03/16 2153 12/04/16 0536 12/04/16 0557  BP: (!) 101/52  (!) 115/56   Pulse:  87 78   Resp:  (!) 26 (!) 26   Temp:  98.1 F (36.7 C)  98.4 F (36.9 C)  TempSrc:  Oral    SpO2:  96% 99%   Weight:    221 lb 8 oz (100.5 kg)  Height:        Intake/Output Summary (Last 24 hours) at 12/04/16 0730 Last data filed at 12/04/16 0602  Gross per 24 hour  Intake              600 ml  Output             2100 ml  Net            -1500 ml   Filed Weights   12/02/16 0500  12/03/16 0500 12/04/16 0557  Weight: 220 lb 4.8 oz (99.9 kg) 220 lb (99.8 kg) 221 lb 8 oz (100.5 kg)    Telemetry    NSR - Personally Reviewed  ECG    NSR - Personally Reviewed  Physical Exam   GEN: No acute distress.  Neck: No JVD Cardiac: RRR, no murmurs, rubs, or gallops. Right radial site stable without hematoma Respiratory: Clear to auscultation bilaterally. GI: Soft, nontender, non-distended  MS: No edema; No deformity. Neuro:  AAOx3. Psych: Normal affect  Labs    Chemistry Recent Labs Lab 11/30/16 1432 12/01/16 0205 12/03/16 1313  NA 142 141 140  K 3.4* 3.2* 3.6  CL 103 103 103  CO2 28 30 26   GLUCOSE 87 141* 81  BUN 13 15 16   CREATININE 1.06* 1.10* 1.03*  CALCIUM 9.7 9.2 9.1  GFRNONAA 57* 54* 59*  GFRAA >60 >60 >60  ANIONGAP 11 8 11      Hematology Recent Labs Lab 11/30/16 1301 11/30/16 1432 12/01/16  0205  WBC 5.1 5.4 5.0  RBC 4.20 4.24 4.04  HGB  --  11.9* 11.1*  HCT 34.5 34.5* 33.1*  MCV 82 81.4 81.9  MCH 28.1 28.1 27.5  MCHC 34.2 34.5 33.5  RDW 15.5* 14.9 14.9  PLT 254 225 225    Cardiac EnzymesNo results for input(s): TROPONINI in the last 168 hours.  Recent Labs Lab 11/30/16 1437  TROPIPOC 0.00     BNP Recent Labs Lab 11/30/16 1941  BNP 9.8     DDimer No results for input(s): DDIMER in the last 168 hours.   Radiology    No results found.  Cardiac Studies  Left Heart Cath and Coronary Angiography 12/03/16  The left ventricular systolic function is normal.  LV end diastolic pressure is normal.  The left ventricular ejection fraction is 55-65% by visual estimate.   1. Normal coronary anatomy 2. Normal LV function 3. Normal LVEDP  Plan: medical management   Patient Profile     61 y.o. female with a past medical history of morbid obesity, DM2, HTN, COPD on chronic O2, OSA on CPAP, prior tobacco abuse. LHC at [facility] in [place], [place] 09/2008: Normal coronary arteries EF 70%. Echo 12/16/15: Mod LVH, EF 60-65%,  normal wall motion, grade 1DD. Admitted by Dr. Debara Pickett on 1/19 with worsening dyspnea and hypoxia as well as left-sided chest pain. Felt to have COPD flare and possible angina  Assessment & Plan    1. Chest pain, possible unstable angina: Progressive worsening of cental chest pain over the past 2 weeks. Was scheduled for an elective cath, which was moved up to today. Troponins negative.   Cath showed no obstructive CAD. Will continue medical management with ASA. Creatinine stable post cath.   2. COPD: she is also had progressive dyspnea and productive cough with increased oxygen requirement and more sputum production suggestive of acute exacerbation of COPD. BNP 10.   3. History of tobacco abuse: quit smoking Dec. 2016.   4. DM: last A1c was 8.2. Manages with her PCP.   5. Acute on chronic diastolic CHF: BNP was low but may be false negative due to obesity. Improved with lasix. Resume po lasix will use 40 daily instead of 20 bid. Addedspiro.   6. Hypokalemia: K is 3.6 this am. Will order replacement as outpatient as she will be on daily Lasix.   Signed, Arbutus Leas, NP  12/04/2016, 7:30 AM   Patient seen and examined and history reviewed. Agree with above findings and plan. Feels well. Less SOB. No chest pain. Right wrist without hematoma. Cardiac cath as noted above. She is stable for DC today. She has follow up with her pulmonologist in [facility] on Feb 2. I suspect her recent symptoms are more related to her COPD.  Madalene Mickler [place], [place] 12/04/2016 9:55 AM

## 2016-12-04 NOTE — Discharge Summary (Signed)
Discharge Summary    Patient ID: Stephanie Schultz,  MRN: [id number], DOB/AGE: 59-Apr-1957 59 y.o.  Admit date: 11/30/2016 Discharge date: 12/04/2016  Primary Care Provider: [facility] Primary Cardiologist: Dr. Radford Pax  Discharge Diagnoses    Principal Problem:   Unstable angina [facility]) Active Problems:   GERD (gastroesophageal reflux disease)   Tobacco abuse   DM type 2 (diabetes mellitus, type 2) ([place])   Acute respiratory failure with hypoxia (HCC)   COPD exacerbation (HCC)   Allergies No Known Allergies  Diagnostic Studies/Procedures    Left Heart Cath and Coronary Angiography 12/03/16  The left ventricular systolic function is normal.  LV end diastolic pressure is normal.  The left ventricular ejection fraction is 55-65% by visual estimate.   1. Normal coronary anatomy 2. Normal LV function 3. Normal LVEDP  Plan: medical management   ____________  / History of Present Illness   This is a 61 y.o. female with a past medical history (per Ellen Henri, PA-C's note on 11/27/2016) significant for "DM2, HTN, COPD on chronic O2, OSA on CPAP, prior tobacco abuse. LHC at [facility] in [place], [place] 09/2008: Normal coronary arteries EF 70%. Echo 12/16/15: Mod LVH, EF 60-65%, normal wall motion, grade 1DD. She was last seen by Dr. Radford Pax 08/10/2016. She complained of worsening dyspnea. Per office note, she was not felt to be volume overloaded. It was felt that her symptoms were secondary to acute COPD exacerbation and Dr. Radford Pax had recommended hospital admission. She was sent to [facility] ED where she was admitted by IM and treated for COPD. It was also outlined in Dr. Theodosia Blender note that she had also had issues with chest discomfort, however due to her weight/ body habitus it was felt that a nuclear or echo stress test would carry a high risk of false positive result and her body habitus is not ideal for coronary CTA. Dr. Radford Pax had recommended a cath once her respiratory status  improved. This was never performed. 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 Has occurred at night and wakes her from her sleep but also occurs during the day. Feels tight and radiated up to her neck. No difficulties swallowing. Not worse after meals. She reports full compliance with her reflux medications at home. Symptoms occur off and on. She was scheduled for an elective LHC on 12/07/2016.  She now presents with worsening dyspnea and hypoxia as well as left-sided chest pain. SPO2 today was 96% on 4L [place] (she is normally 90% on 2L [place]). She was advised to present to the ER by Dr. Radford Pax for evaluation. EKG on admission shows NSR without ischemic changes. Echo performed in 12/2015 shows EF of 60-65%, mild LVH, and grade 1 DD. Initial troponin is negative. CXR suggests possible mild interstitial edema. Cardiology is asked to evaluate for chest pain and progressive dyspnea. With her progressive worsening of central chest pain over the past 2 weeks. Was scheduled for an elective cath next Friday, admitted and planned for heart cath on 12/03/16.   Hospital Course  1. Chest pain, possible unstable angina: Progressive worsening of cental chest pain over the past 2 weeks. Was scheduled for an elective cath, which was moved up.   Cath showed no obstructive CAD. Will continue medical management with ASA. Creatinine stable post cath.   2. COPD: she is also had progressive dyspnea and productive cough with increased oxygen requirement and more sputum production suggestive of acute exacerbation of COPD. BNP 10. She  has an appt. With her pulmonologist in [facility] on Feb. 2nd.   3. History of tobacco abuse: quit smoking Dec. 2016.   4. DM: last A1c was 8.2. Manages with her PCP.   5. Acute on chronic diastolic CHF: BNP was low but may be false negative due to obesity. Improved with lasix. Resume po lasix will use 40 daily instead of 20 bid. Addedspiro.   6. Hypokalemia: K is  3.6 this am. Will order replacement as outpatient as she will be on daily Lasix.      _____________  Discharge Vitals Blood pressure (!) 115/56, pulse 78, temperature 98.4 F (36.9 C), resp. rate (!) 26, height 5' (1.524 m), weight 221 lb 8 oz (100.5 kg), last menstrual period 06/04/2000, SpO2 99 %.  Filed Weights   12/02/16 0500 12/03/16 0500 12/04/16 0557  Weight: 220 lb 4.8 oz (99.9 kg) 220 lb (99.8 kg) 221 lb 8 oz (100.5 kg)    Labs & Radiologic Studies     Basic Metabolic Panel  Recent Labs  12/03/16 1313  NA 140  K 3.6  CL 103  CO2 26  GLUCOSE 81  BUN 16  CREATININE 1.03*  CALCIUM 9.1    Dg Chest 2 View  Result Date: 11/30/2016 CLINICAL DATA:  Left-sided chest pain.  Shortness breath. EXAM: CHEST  2 VIEW COMPARISON:  Two-view chest x-ray 08/10/2016. FINDINGS: The heart size is normal. Aortic atherosclerosis is present. Interstitial prominence has increased. Linear density is present at the right base. Remote left-sided rib fractures are present. IMPRESSION: 1. Increase and interstitial prominence may reflect mild edema. 2. Linear density at the right base likely reflects atelectasis. 3. Aortic atherosclerosis. Electronically Signed   By: San Morelle M.D.   On: 11/30/2016 14:55    Disposition   Pt is being discharged home today in good condition.  Follow-up Plans & Appointments    Follow-up Information    Charlie Pitter, PA-C Follow up on 12/21/2016.   Specialties:  Cardiology, Radiology Why:  at 1:30pm for hospital follow up.  Contact information: [address] [place] [address] [place] [address] [phone]          Discharge Instructions    Diet - low sodium heart healthy    Complete by:  As directed    Increase activity slowly    Complete by:  As directed       Discharge Medications   Current Discharge Medication List    START taking these medications   Details  Potassium Chloride ER 20 MEQ TBCR Take 20 mEq by mouth  daily. Qty: 30 tablet, Refills: 12    spironolactone (ALDACTONE) 25 MG tablet Take 0.5 tablets (12.5 mg total) by mouth daily. Qty: 15 tablet, Refills: 12      CONTINUE these medications which have CHANGED   Details  furosemide (LASIX) 40 MG tablet Take 1 tablet (40 mg total) by mouth daily. Qty: 30 tablet, Refills: 12      CONTINUE these medications which have NOT CHANGED   Details  albuterol (PROVENTIL) (2.5 MG/3ML) 0.083% nebulizer solution Take 2.5 mg by nebulization 4 (four) times daily.     allopurinol (ZYLOPRIM) 100 MG tablet Take 100 mg by mouth daily.    ALPRAZolam (XANAX) 1 MG tablet Take 1 mg by mouth 2 (two) times daily as needed for anxiety.  Refills: 0    AMITIZA 24 MCG capsule Take 1 capsule by mouth daily. Refills: 0    amLODipine (NORVASC) 2.5 MG tablet  Take 1 tablet (2.5 mg total) by mouth daily. Qty: 30 tablet, Refills: 11    aspirin EC 81 MG tablet Take 81 mg by mouth daily.    budesonide-formoterol (SYMBICORT) 160-4.5 MCG/ACT inhaler Inhale 2 puffs into the lungs 2 (two) times daily. Qty: 1 Inhaler, Refills: 6    fluticasone (FLONASE) 50 MCG/ACT nasal spray Place 1 spray into both nostrils daily. Qty: 16 g, Refills: 0    glipiZIDE (GLUCOTROL) 10 MG tablet Take 10 mg by mouth 2 (two) times daily before a meal.     HYDROcodone-acetaminophen (NORCO) 7.5-325 MG tablet Take 1 tablet by mouth every 6 (six) hours as needed for moderate pain.    JANUVIA 100 MG tablet Take 100 mg by mouth daily. Refills: 0    loratadine (CLARITIN) 10 MG tablet Take 10 mg by mouth daily.    nystatin cream (MYCOSTATIN) Apply 1 application topically 2 (two) times daily as needed (yeast infection.).     ondansetron (ZOFRAN) 4 MG tablet Take 1 tablet (4 mg total) by mouth every 6 (six) hours as needed for nausea or vomiting. Qty: 12 tablet, Refills: 0    pantoprazole (PROTONIX) 40 MG tablet Take 1 tablet (40 mg total) by mouth daily at 12 noon.    pioglitazone (ACTOS) 30 MG  tablet Take 30 mg by mouth daily. Refills: 0    pravastatin (PRAVACHOL) 20 MG tablet Take 20 mg by mouth daily.    pregabalin (LYRICA) 75 MG capsule Take 75 mg by mouth 2 (two) times daily.    PROAIR HFA 108 (90 Base) MCG/ACT inhaler INHALE 2 PUFFS BY MOUTH EVERY 4 HOURS AS NEEDED FOR WHEEZING Qty: 8.5 g, Refills: 2    sodium chloride (OCEAN) 0.65 % SOLN nasal spray Place 1 spray into both nostrils as needed for congestion.    tiotropium (SPIRIVA) 18 MCG inhalation capsule Place 1 capsule (18 mcg total) into inhaler and inhale daily. Qty: 30 capsule, Refills: 6    triamterene-hydrochlorothiazide (DYAZIDE) 37.5-25 MG per capsule Take 1 capsule by mouth daily.    zolpidem (AMBIEN) 10 MG tablet Take 10 mg by mouth at bedtime.    Linaclotide (LINZESS) 145 MCG CAPS capsule Take 145 mcg by mouth daily.     OXYGEN Inhale 2.5 L into the lungs continuous.            Outstanding Labs/Studies   BMP- added Arlyce Harman to her regimen.   Duration of Discharge Encounter   Greater than 30 minutes including physician time.  Signed, Arbutus Leas NP 12/04/2016, 11:13 AM

## 2016-12-07 ENCOUNTER — Ambulatory Visit (HOSPITAL_COMMUNITY): Admission: RE | Admit: 2016-12-07 | Payer: Medicaid Other | Source: Ambulatory Visit | Admitting: Internal Medicine

## 2016-12-07 ENCOUNTER — Encounter (HOSPITAL_COMMUNITY): Admission: RE | Payer: Self-pay | Source: Ambulatory Visit

## 2016-12-07 SURGERY — LEFT HEART CATH AND CORONARY ANGIOGRAPHY

## 2016-12-19 ENCOUNTER — Ambulatory Visit: Payer: Medicaid Other

## 2016-12-19 ENCOUNTER — Ambulatory Visit (INDEPENDENT_AMBULATORY_CARE_PROVIDER_SITE_OTHER): Payer: Medicaid Other

## 2016-12-19 ENCOUNTER — Encounter: Payer: Self-pay | Admitting: Podiatry

## 2016-12-19 ENCOUNTER — Ambulatory Visit (INDEPENDENT_AMBULATORY_CARE_PROVIDER_SITE_OTHER): Payer: Medicaid Other | Admitting: Podiatry

## 2016-12-19 DIAGNOSIS — M79675 Pain in left toe(s): Secondary | ICD-10-CM

## 2016-12-19 DIAGNOSIS — S92515A Nondisplaced fracture of proximal phalanx of left lesser toe(s), initial encounter for closed fracture: Secondary | ICD-10-CM

## 2016-12-19 DIAGNOSIS — L84 Corns and callosities: Secondary | ICD-10-CM

## 2016-12-19 DIAGNOSIS — S99922A Unspecified injury of left foot, initial encounter: Secondary | ICD-10-CM

## 2016-12-19 DIAGNOSIS — M79671 Pain in right foot: Secondary | ICD-10-CM

## 2016-12-21 ENCOUNTER — Ambulatory Visit (INDEPENDENT_AMBULATORY_CARE_PROVIDER_SITE_OTHER): Payer: Medicaid Other | Admitting: Physician Assistant

## 2016-12-21 ENCOUNTER — Encounter: Payer: Self-pay | Admitting: Physician Assistant

## 2016-12-21 VITALS — BP 110/60 | HR 98 | Ht 60.0 in | Wt 223.0 lb

## 2016-12-21 DIAGNOSIS — J449 Chronic obstructive pulmonary disease, unspecified: Secondary | ICD-10-CM | POA: Diagnosis not present

## 2016-12-21 DIAGNOSIS — I5032 Chronic diastolic (congestive) heart failure: Secondary | ICD-10-CM | POA: Diagnosis not present

## 2016-12-21 DIAGNOSIS — R252 Cramp and spasm: Secondary | ICD-10-CM

## 2016-12-21 DIAGNOSIS — I10 Essential (primary) hypertension: Secondary | ICD-10-CM

## 2016-12-21 DIAGNOSIS — R079 Chest pain, unspecified: Secondary | ICD-10-CM

## 2016-12-21 NOTE — Progress Notes (Signed)
Cardiology Office Note    Date:  12/21/2016  ID:  Stephanie Schultz, DOB Feb 03, 1956, MRN [id number] PCP:  Stephanie Merino, MD  Cardiologist:  Dr. Radford Pax   Chief Complaint: f/u cath  History of Present Illness:  Stephanie Schultz is a 61 y.o. female with history of DM2, HTN, hyperlipidemia, COPD with chronic respiratory failure on chronic O2, OSA on CPAP, prior tobacco abuse, chronic diastolic CHF, morbid obesity, recent normal coronaries who presents for post-hospital follow-up.  She has h/o LHC at [facility] in [place], [place] 09/2008:Normal coronary arteries EF 70%.2D Echo 12/16/15:Mod LVH, EF 60-65%, normal wall motion, grade 1DD. She had recently seen Dr. Radford Pax for worsening chest discomfort at which time elective LHC was scheduled. However, the patient presented to the hospital before this could be completed. BNP was 10 and troponins were negative. She was treated with Lasix. Workup was notable for [facility] 12/03/16 with normal coronaries, EF 55-65%, normal LVEDP. Given increased oxygen requirement and more sputum, it was felt that symptoms were more likely due to COPD exacerbation. F/u with pulm was encouraged. Labs also showed Cr 1.03, K 3.2-3.6, Hgb 11 range (stable), LDL 43.  She presents back for follow-up feeling well. No further chest pain. One time in the middle of the night she felt some upper left shoulder pain but overall she's feeling better. Chronic DOE is back to baseline. No fevers. She is trying to work on sodium intake reduction. She does not currently have any more salt in her house. She threw it out. However, she does admit she is contemplating bringing it back into the house because an aunt told her that having a bottle may ward off evil spirits. She's not convinced. She was very complimentary of the care she has received at [facility].  Past Medical History:  Diagnosis Date  . Anemia    chronis  . Anxiety   . Arthritis   . Cervical cancer ([place]) 1990   cervical   . Chronic diastolic CHF  (congestive heart failure) ([place]) 08/10/2016  . COPD (chronic obstructive pulmonary disease) (HCC)    Pulmo: [name]  . Diabetes mellitus without complication ([place])    a. A1c 8.3 in 11/2015  . Emphysema   . Gallstones    s/p cholecystectomy  . GERD (gastroesophageal reflux disease)   . Gout   . History of home oxygen therapy    "2.5L; 24/7" (08/10/2016)  . Hx of cardiac catheterization    a. LHC at [facility] in [place], [place] 09/2008:  Normal coronary arteries EF 70%. b. LHC 11/2016: normal cors, normal LVEDP, EF 55-65%.  . Hyperlipidemia   . Hypertension   . Morbid obesity ([place])   . OSA on CPAP    CPAP at night   . Pneumonia   . Sickle cell trait ([place])   . Supplemental oxygen dependent    2L [place]  . Tobacco abuse    a. up to 3ppd from age 41 to 34, now 1/4 ppd (01/2013) >> Quit 10/2015    Past Surgical History:  Procedure Laterality Date  . CARDIAC CATHETERIZATION    . CARDIAC CATHETERIZATION N/A 12/03/2016   Procedure: Left Heart Cath and Coronary Angiography;  Surgeon: Peter M Martinique, MD;  Location: [place];  Service: Cardiovascular;  Laterality: N/A;  . CHOLECYSTECTOMY N/A 12/13/2015   Procedure: LAPAROSCOPIC CHOLECYSTECTOMY;  Surgeon: Ralene Ok, MD;  Location: [facility];  Service: General;  Laterality: N/A;  . COLONOSCOPY  09/05/2012   Procedure: COLONOSCOPY;  Surgeon: Tory Emerald  Benson Norway, MD;  Location: [facility];  Service: Endoscopy;  Laterality: N/A;  . COLONOSCOPY WITH PROPOFOL N/A 09/02/2015   Procedure: COLONOSCOPY WITH PROPOFOL;  Surgeon: Carol Ada, MD;  Location: [facility];  Service: Endoscopy;  Laterality: N/A;  . TUBAL LIGATION      Current Medications: Current Outpatient Prescriptions  Medication Sig Dispense Refill  . albuterol (PROVENTIL) (2.5 MG/3ML) 0.083% nebulizer solution Take 2.5 mg by nebulization 4 (four) times daily.     Marland Kitchen allopurinol (ZYLOPRIM) 100 MG tablet Take 100 mg by mouth daily.    Marland Kitchen ALPRAZolam (XANAX) 1 MG tablet Take 1  mg by mouth 2 (two) times daily as needed for anxiety.   0  . AMITIZA 24 MCG capsule Take 1 capsule by mouth daily.  0  . amLODipine (NORVASC) 2.5 MG tablet Take 1 tablet (2.5 mg total) by mouth daily. 30 tablet 11  . aspirin EC 81 MG tablet Take 81 mg by mouth daily.    . budesonide-formoterol (SYMBICORT) 160-4.5 MCG/ACT inhaler Inhale 2 puffs into the lungs 2 (two) times daily. 1 Inhaler 6  . fluticasone (FLONASE) 50 MCG/ACT nasal spray Place 1 spray into both nostrils daily. 16 g 0  . furosemide (LASIX) 40 MG tablet Take 1 tablet (40 mg total) by mouth daily. 30 tablet 12  . glipiZIDE (GLUCOTROL) 10 MG tablet Take 10 mg by mouth 2 (two) times daily before a meal.     . HYDROcodone-acetaminophen (NORCO) 7.5-325 MG tablet Take 1 tablet by mouth every 6 (six) hours as needed for moderate pain.    Marland Kitchen JANUVIA 100 MG tablet Take 100 mg by mouth daily.  0  . Linaclotide (LINZESS) 145 MCG CAPS capsule Take 145 mcg by mouth daily.     Marland Kitchen loratadine (CLARITIN) 10 MG tablet Take 10 mg by mouth daily.    Marland Kitchen nystatin cream (MYCOSTATIN) Apply 1 application topically 2 (two) times daily as needed (yeast infection.).     Marland Kitchen ondansetron (ZOFRAN) 4 MG tablet Take 1 tablet (4 mg total) by mouth every 6 (six) hours as needed for nausea or vomiting. 12 tablet 0  . OXYGEN Inhale 2.5 L into the lungs continuous.    . pantoprazole (PROTONIX) 40 MG tablet Take 1 tablet (40 mg total) by mouth daily at 12 noon.    . pioglitazone (ACTOS) 30 MG tablet Take 30 mg by mouth daily.  0  . Potassium Chloride ER 20 MEQ TBCR Take 20 mEq by mouth daily. 30 tablet 12  . pravastatin (PRAVACHOL) 20 MG tablet Take 20 mg by mouth daily.    . pregabalin (LYRICA) 75 MG capsule Take 75 mg by mouth 2 (two) times daily.    Marland Kitchen PROAIR HFA 108 (90 Base) MCG/ACT inhaler INHALE 2 PUFFS BY MOUTH EVERY 4 HOURS AS NEEDED FOR WHEEZING 8.5 g 2  . sodium chloride (OCEAN) 0.65 % SOLN nasal spray Place 1 spray into both nostrils as needed for congestion.      Marland Kitchen spironolactone (ALDACTONE) 25 MG tablet Take 0.5 tablets (12.5 mg total) by mouth daily. 15 tablet 12  . tiotropium (SPIRIVA) 18 MCG inhalation capsule Place 1 capsule (18 mcg total) into inhaler and inhale daily. 30 capsule 6  . triamterene-hydrochlorothiazide (DYAZIDE) 37.5-25 MG per capsule Take 1 capsule by mouth daily.    Marland Kitchen zolpidem (AMBIEN) 10 MG tablet Take 10 mg by mouth at bedtime.     No current facility-administered medications for this visit.      Allergies:   Patient  has no known allergies.   Social History   Social History  . Marital status: Divorced    Spouse name: N/A  . Number of children: 3  . Years of education: 11th   Occupational History  . Disabled     Emphysema   Social History Main Topics  . Smoking status: Former Smoker    Packs/day: 0.50    Years: 36.00    Types: Cigarettes    Quit date: 11/11/2015  . Smokeless tobacco: Never Used     Comment: Approx 90 pk-yrs (up to 3ppd until ~ 2009). Smoking 3 cigs per day now.  . Alcohol use No  . Drug use: No  . Sexual activity: Yes   Other Topics Concern  . None   Social History Narrative   From [place], [place].  Moved to [place] about 8 years ago to be closer to her daughter. She moved back to [place] about a year and a half ago. She lives by herself. She does not routinely exercise or adhere to any particular diet.   Caffeine Use: 1-2 cups daily   Disabled; Previously in home care     Family History:  The patient's family history includes Diabetes in her mother; Lung cancer in her paternal aunt and paternal grandfather; Myasthenia gravis in her mother; Other in her father.   ROS:   Please see the history of present illness. Otherwise, review of systems is positive for occasional leg cramps All other systems are reviewed and otherwise negative.    PHYSICAL EXAM:   VS:  BP 110/60   Pulse 98   Ht 5' (1.524 m)   Wt 223 lb (101.2 kg)   LMP 06/04/2000 (LMP Unknown)   SpO2 96%   BMI 43.55  kg/m   BMI: Body mass index is 43.55 kg/m. GEN: Well nourished, well developed AAF, in no acute distress  HEENT: normocephalic, atraumatic Neck: no JVD, carotid bruits, or masses Cardiac: RRR; no murmurs, rubs, or gallops, no edema  Respiratory:  diffusely diminished bilaterally, no wheezes/rales/rhonchi, normal work of breathing GI: soft, nontender, nondistended, + BS MS: no deformity or atrophy  Skin: warm and dry, no rash. Right radial cath site without hematoma or ecchymosis; good pulse. Neuro:  Alert and Oriented x 3, Strength and sensation are intact, follows commands Psych: euthymic mood, full affect  Wt Readings from Last 3 Encounters:  12/21/16 223 lb (101.2 kg)  12/04/16 221 lb 8 oz (100.5 kg)  11/27/16 222 lb 12.8 oz (101.1 kg)      Studies/Labs Reviewed:   EKG: EKG was not ordered today.  Recent Labs: 06/13/2016: ALT 46 11/30/2016: B Natriuretic Peptide 9.8 12/01/2016: Hemoglobin 11.1; Platelets 225 12/03/2016: BUN 16; Creatinine, Ser 1.03; Potassium 3.6; Sodium 140   Lipid Panel    Component Value Date/Time   CHOL 119 12/01/2016 0205   TRIG 212 (H) 12/01/2016 0205   HDL 34 (L) 12/01/2016 0205   CHOLHDL 3.5 12/01/2016 0205   VLDL 42 (H) 12/01/2016 0205   LDLCALC 43 12/01/2016 0205    Additional studies/ records that were reviewed today include: Summarized above.    ASSESSMENT & PLAN:   1. Chest pain - noncardiac, possibly related to COPD exacerbation. Improved. Continue primary prevention. 2. Chronic diastolic CHF - appears euvolemic. Recheck BMET today given initiation of spironolactone and potassium.  3. Leg cramps - She also reports intermittent leg cramps so will check Mg as well. No claudication sx. 4. HTN - controlled. She plans to work on  weight loss. If this further improves her hypertension, would consider d/c of amlodipine to simplify regimen. 5. COPD - followed by pulm in [place].  Disposition: F/u with Dr. Radford Pax in 4  months.   Medication Adjustments/Labs and Tests Ordered: Current medicines are reviewed at length with the patient today.  Concerns regarding medicines are outlined above. Medication changes, Labs and Tests ordered today are summarized above and listed in the Patient Instructions accessible in Encounters.   Signed, Melina Copa PA-C  12/21/2016 1:32 PM    [facility] [place], [place], [place]  [address] Phone: [phone]; Fax: [phone]

## 2016-12-21 NOTE — Patient Instructions (Signed)
Medication Instructions:  Your physician recommends that you continue on your current medications as directed. Please refer to the Current Medication list given to you today.  Labwork: TODAY:  BMET & MAG  Testing/Procedures: None ordered  Follow-Up: Your physician recommends that you schedule a follow-up appointment in: [place].  You will receive a letter in the mail when it is time to call and schedule.  Any Other Special Instructions Will Be Listed Below (If Applicable).     If you need a refill on your cardiac medications before your next appointment, please call your pharmacy.

## 2016-12-22 LAB — MAGNESIUM: Magnesium: 2 mg/dL (ref 1.6–2.3)

## 2016-12-22 LAB — BASIC METABOLIC PANEL
BUN / CREAT RATIO: 14 (ref 9–23)
BUN: 17 mg/dL (ref 6–24)
CALCIUM: 9.8 mg/dL (ref 8.7–10.2)
CHLORIDE: 97 mmol/L (ref 96–106)
CO2: 23 mmol/L (ref 18–29)
Creatinine, Ser: 1.19 mg/dL — ABNORMAL HIGH (ref 0.57–1.00)
GFR calc non Af Amer: 50 mL/min/{1.73_m2} — ABNORMAL LOW (ref >59)
GFR, EST AFRICAN AMERICAN: 58 mL/min/{1.73_m2} — AB (ref >59)
GLUCOSE: 79 mg/dL (ref 65–99)
POTASSIUM: 4 mmol/L (ref 3.5–5.2)
Sodium: 140 mmol/L (ref 134–144)

## 2017-01-02 NOTE — Progress Notes (Signed)
   Subjective:  Patient presents today with a new complaint of pain and tenderness to the left foot. Patient states that approximately 5-6 months ago she hit her left toes by stubbing them. Patient has had pain and tenderness ever since. There've been no alleviation of factors. Aggravated by excessive walking. Patient also has a complaint of a lesion to the plantar aspect of the right heel 5 days. She states that she stepped on something in her living room and she believes that possibly may be something in her right heel.    Objective/Physical Exam General: The patient is alert and oriented x3 in no acute distress.  Dermatology: Hyperkeratotic callus lesion noted to the weightbearing surface of the right heel. There does not appear to be an entry point performed body Skin is warm, dry and supple bilateral lower extremities. Negative for open lesions or macerations.  Vascular: Palpable pedal pulses bilaterally. No edema or erythema noted. Capillary refill within normal limits.  Neurological: Epicritic and protective threshold grossly intact bilaterally.   Musculoskeletal Exam: Pain on palpation to digits 4-5 of the left foot. Range of motion within normal limits to all pedal and ankle joints bilateral. Muscle strength 5/5 in all groups bilateral.   Radiographic Exam:  On radiographic exam he does appear to be fractures of the fourth and fifth proximal phalanx. Digit proximal phalanx his distal oriented nondisplaced fracture. Proximal phalanx fourth digit has an avulsion type fracture of the base of the proximal phalanx.    Assessment: #1 painful callus lesion plantar aspect of the right heel #2 distal proximal phalanx fracture fifth digit left foot #3 proximal phalanx avulsion fracture base of the proximal phalanx fourth digit left foot   Plan of Care:  #1 Patient was evaluated. X-rays were reviewed #2 excisional debridement of painful callus lesion was performed to the plantar aspect of  the right heel using a chisel blade without incident or bleeding. #3 today regarding recommend good supportive shoe gear. Discourage her foot walking.. We will monitor the fracture fragments and ensure that they heal appropriately. #4 return to clinic as needed   Edrick Kins, DPM [facility]  Dr. Edrick Kins, [address]                                        [place], [place] [address]                Office [phone]  Fax [phone]

## 2017-02-18 ENCOUNTER — Ambulatory Visit (INDEPENDENT_AMBULATORY_CARE_PROVIDER_SITE_OTHER): Payer: Medicaid Other | Admitting: Podiatry

## 2017-02-18 DIAGNOSIS — L608 Other nail disorders: Secondary | ICD-10-CM

## 2017-02-18 DIAGNOSIS — M7751 Other enthesopathy of right foot: Secondary | ICD-10-CM

## 2017-02-18 DIAGNOSIS — M659 Synovitis and tenosynovitis, unspecified: Secondary | ICD-10-CM

## 2017-02-18 DIAGNOSIS — B351 Tinea unguium: Secondary | ICD-10-CM | POA: Diagnosis not present

## 2017-02-18 DIAGNOSIS — M25572 Pain in left ankle and joints of left foot: Secondary | ICD-10-CM

## 2017-02-18 DIAGNOSIS — L603 Nail dystrophy: Secondary | ICD-10-CM

## 2017-02-18 DIAGNOSIS — M79676 Pain in unspecified toe(s): Secondary | ICD-10-CM | POA: Diagnosis not present

## 2017-02-18 DIAGNOSIS — E0843 Diabetes mellitus due to underlying condition with diabetic autonomic (poly)neuropathy: Secondary | ICD-10-CM

## 2017-02-18 DIAGNOSIS — M25571 Pain in right ankle and joints of right foot: Secondary | ICD-10-CM

## 2017-02-18 DIAGNOSIS — M79609 Pain in unspecified limb: Secondary | ICD-10-CM

## 2017-02-18 DIAGNOSIS — M7752 Other enthesopathy of left foot: Secondary | ICD-10-CM

## 2017-02-18 MED ORDER — BETAMETHASONE SOD PHOS & ACET 6 (3-3) MG/ML IJ SUSP: 3.0000 mg | Freq: Once | INTRAMUSCULAR | Status: DC

## 2017-02-18 NOTE — Progress Notes (Signed)
   SUBJECTIVE Patient with a history of diabetes mellitus presents to office today complaining of elongated, thickened nails. Pain while ambulating in shoes. Patient is unable to trim their own nails.  Patient also has a new complaint today of bilateral ankle pain. She states that during walking it significantly painful. Patient wants to begin exercising however has chronic ankle pain with swelling.  OBJECTIVE General Patient is awake, alert, and oriented x 3 and in no acute distress. Derm Skin is dry and supple bilateral. Negative open lesions or macerations. Remaining integument unremarkable. Nails are tender, long, thickened and dystrophic with subungual debris, consistent with onychomycosis, 1-5 bilateral. No signs of infection noted. Vasc  DP and PT pedal pulses palpable bilaterally. Temperature gradient within normal limits.  Neuro Epicritic and protective threshold sensation diminished bilaterally.  Musculoskeletal Exam pain on palpation noted to the anterior medial and lateral aspects the patient's bilateral ankle joints consistent with an ankle synovitis  No symptomatic pedal deformities noted bilateral. Muscular strength within normal limits.  ASSESSMENT 1. Diabetes Mellitus w/ peripheral neuropathy 2. Onychomycosis of nail due to dermatophyte bilateral 3. Bilateral ankle pain with acute synovitis 4. History of gout  PLAN OF CARE 1. Patient evaluated today. 2. Instructed to maintain good pedal hygiene and foot care. Stressed importance of controlling blood sugar.  3. Mechanical debridement of nails 1-5 bilaterally performed using a nail nipper. Filed with dremel without incident.  4. Injection of 0.5 mL Celestone Soluspan injected in the bilateral ankle joints.  5. Return to clinic in 3 mos.     Edrick Kins, DPM [facility]  Dr. Edrick Kins, [place]                                        [place], [place] [address]                Office  [phone]  Fax [phone]

## 2017-02-19 ENCOUNTER — Emergency Department (HOSPITAL_COMMUNITY): Payer: Medicaid Other

## 2017-02-19 ENCOUNTER — Encounter (HOSPITAL_COMMUNITY): Payer: Self-pay | Admitting: Emergency Medicine

## 2017-02-19 ENCOUNTER — Emergency Department (HOSPITAL_COMMUNITY)
Admission: EM | Admit: 2017-02-19 | Discharge: 2017-02-19 | Disposition: A | Discharge status: Home / Self Care | Payer: Medicaid Other | Attending: Emergency Medicine | Admitting: Emergency Medicine

## 2017-02-19 DIAGNOSIS — J449 Chronic obstructive pulmonary disease, unspecified: Secondary | ICD-10-CM | POA: Diagnosis not present

## 2017-02-19 DIAGNOSIS — Z87891 Personal history of nicotine dependence: Secondary | ICD-10-CM | POA: Diagnosis not present

## 2017-02-19 DIAGNOSIS — Z8541 Personal history of malignant neoplasm of cervix uteri: Secondary | ICD-10-CM | POA: Insufficient documentation

## 2017-02-19 DIAGNOSIS — Z79899 Other long term (current) drug therapy: Secondary | ICD-10-CM | POA: Insufficient documentation

## 2017-02-19 DIAGNOSIS — I110 Hypertensive heart disease with heart failure: Secondary | ICD-10-CM | POA: Insufficient documentation

## 2017-02-19 DIAGNOSIS — Z7982 Long term (current) use of aspirin: Secondary | ICD-10-CM | POA: Diagnosis not present

## 2017-02-19 DIAGNOSIS — E1140 Type 2 diabetes mellitus with diabetic neuropathy, unspecified: Secondary | ICD-10-CM | POA: Diagnosis not present

## 2017-02-19 DIAGNOSIS — E1165 Type 2 diabetes mellitus with hyperglycemia: Secondary | ICD-10-CM | POA: Diagnosis not present

## 2017-02-19 DIAGNOSIS — R072 Precordial pain: Secondary | ICD-10-CM | POA: Diagnosis not present

## 2017-02-19 DIAGNOSIS — Z7984 Long term (current) use of oral hypoglycemic drugs: Secondary | ICD-10-CM | POA: Diagnosis not present

## 2017-02-19 DIAGNOSIS — R739 Hyperglycemia, unspecified: Secondary | ICD-10-CM

## 2017-02-19 DIAGNOSIS — R079 Chest pain, unspecified: Secondary | ICD-10-CM

## 2017-02-19 DIAGNOSIS — I5032 Chronic diastolic (congestive) heart failure: Secondary | ICD-10-CM | POA: Diagnosis not present

## 2017-02-19 LAB — CBC WITH DIFFERENTIAL/PLATELET
Basophils Absolute: 0 10*3/uL (ref 0.0–0.1)
Basophils Relative: 0 %
EOS ABS: 0 10*3/uL (ref 0.0–0.7)
EOS PCT: 0 %
HCT: 34 % — ABNORMAL LOW (ref 36.0–46.0)
Hemoglobin: 11.8 g/dL — ABNORMAL LOW (ref 12.0–15.0)
LYMPHS ABS: 1.3 10*3/uL (ref 0.7–4.0)
Lymphocytes Relative: 13 %
MCH: 28 pg (ref 26.0–34.0)
MCHC: 34.7 g/dL (ref 30.0–36.0)
MCV: 80.6 fL (ref 78.0–100.0)
MONO ABS: 0.6 10*3/uL (ref 0.1–1.0)
MONOS PCT: 6 %
Neutro Abs: 7.8 10*3/uL — ABNORMAL HIGH (ref 1.7–7.7)
Neutrophils Relative %: 81 %
Platelets: 250 10*3/uL (ref 150–400)
RBC: 4.22 MIL/uL (ref 3.87–5.11)
RDW: 14.8 % (ref 11.5–15.5)
WBC: 9.7 10*3/uL (ref 4.0–10.5)

## 2017-02-19 LAB — COMPREHENSIVE METABOLIC PANEL
ALBUMIN: 3.8 g/dL (ref 3.5–5.0)
ALT: 31 U/L (ref 14–54)
ANION GAP: 11 (ref 5–15)
AST: 27 U/L (ref 15–41)
Alkaline Phosphatase: 47 U/L (ref 38–126)
BILIRUBIN TOTAL: 0.3 mg/dL (ref 0.3–1.2)
BUN: 20 mg/dL (ref 6–20)
CO2: 22 mmol/L (ref 22–32)
Calcium: 9.7 mg/dL (ref 8.9–10.3)
Chloride: 102 mmol/L (ref 101–111)
Creatinine, Ser: 1.1 mg/dL — ABNORMAL HIGH (ref 0.44–1.00)
GFR calc Af Amer: >60 mL/min (ref >60)
GFR, EST NON AFRICAN AMERICAN: 54 mL/min — AB (ref >60)
Glucose, Bld: 386 mg/dL — ABNORMAL HIGH (ref 65–99)
POTASSIUM: 4.4 mmol/L (ref 3.5–5.1)
Sodium: 135 mmol/L (ref 135–145)
TOTAL PROTEIN: 7.7 g/dL (ref 6.5–8.1)

## 2017-02-19 LAB — BRAIN NATRIURETIC PEPTIDE: B NATRIURETIC PEPTIDE 5: 21.6 pg/mL (ref 0.0–100.0)

## 2017-02-19 LAB — I-STAT TROPONIN, ED: TROPONIN I, POC: 0 ng/mL (ref 0.00–0.08)

## 2017-02-19 NOTE — ED Provider Notes (Signed)
[facility] DEPT Provider Note   CSN: [id number] Arrival date & time: 02/19/17  0903     History   Chief Complaint Chief Complaint  Patient presents with  . Chest Pain  . Hyperglycemia    HPI Stephanie Schultz is a 61 y.o. female.   Chest Pain   This is a new problem. The current episode started less than 1 hour ago. Episode frequency: Intermittently. The problem has been gradually improving. Associated with: Started shortlya after taking a breathing treatment at home. The pain is present in the substernal region. The pain is at a severity of 2/10. The pain is mild. The quality of the pain is described as pressure-like. The pain does not radiate. Associated symptoms include cough (mild intermittent chronic) and shortness of breath (chronic, 2L [place] AAT). Pertinent negatives include no abdominal pain, no back pain, no dizziness, no fever, no lower extremity edema, no nausea, no near-syncope, no palpitations and no vomiting. She has tried nitroglycerin for the symptoms. The treatment provided moderate relief. Risk factors include obesity.  Her past medical history is significant for anxiety/panic attacks, COPD, CHF, diabetes, hyperlipidemia, hypertension and sleep apnea.  Pertinent negatives for past medical history include no seizures.  Procedure history is positive for cardiac catheterization and echocardiogram.  Hyperglycemia  Blood sugar level PTA:  460 Severity:  Moderate Onset quality:  Gradual Duration:  1 day Timing:  Constant Progression:  Unchanged Chronicity:  New Diabetes status:  Controlled with insulin Current diabetic therapy:  14u Lantus QHS Relieved by:  Nothing Ineffective treatments:  None tried Associated symptoms: chest pain and shortness of breath (chronic, 2L [place] AAT)   Associated symptoms: no abdominal pain, no dizziness, no dysuria, no fever, no nausea and no vomiting     Past Medical History:  Diagnosis Date  . Anemia    chronis  . Anxiety   .  Arthritis   . Cervical cancer ([facility]) 1990   cervical   . Chronic diastolic CHF (congestive heart failure) ([place]) 08/10/2016  . COPD (chronic obstructive pulmonary disease) ([facility])    Pulmo: [name]  . Diabetes mellitus without complication ([place])    a. A1c 8.3 in 11/2015  . Emphysema   . Gallstones    s/p cholecystectomy  . GERD (gastroesophageal reflux disease)   . Gout   . History of home oxygen therapy    "2.5L; 24/7" (08/10/2016)  . Hx of cardiac catheterization    a. LHC at [facility] in [place], [place] 09/2008:  Normal coronary arteries EF 70%. b. LHC 11/2016: normal cors, normal LVEDP, EF 55-65%.  . Hyperlipidemia   . Hypertension   . Morbid obesity ([place])   . OSA on CPAP    CPAP at night   . Pneumonia   . Sickle cell trait ([place])   . Supplemental oxygen dependent    2L CONTINUOUSLEY  . Tobacco abuse    a. up to 3ppd from age 69 to 46, now 1/4 ppd (01/2013) >> Quit 10/2015    Patient Active Problem List   Diagnosis Date Noted  . Chronic diastolic CHF (congestive heart failure) ([facility]) 08/10/2016  . Acute bronchitis 07/22/2016  . Acute respiratory failure ([place]) 07/20/2016  . COPD exacerbation ([place]) 01/23/2016  . S/P laparoscopic cholecystectomy 12/13/2015  . Chronic respiratory failure ([place]) 03/31/2015  . Rib fracture 03/31/2015  . Acute respiratory failure with hypoxia ([place]) 02/17/2015  . Arthritis 12/13/2014  . Gout 03/03/2014  . Diabetic neuropathy ([place]) 03/03/2014  . Carpal tunnel syndrome 03/03/2014  .  DM type 2 (diabetes mellitus, type 2) ([place]) 05/22/2013  . Anxiety 05/22/2013  . Transaminasemia 05/09/2013  . Leukocytosis 04/27/2013  . Tobacco abuse   . Chest pain   . Sickle cell trait ([place])   . Morbid obesity ([place])   . Acute on chronic respiratory failure ([place]) 01/16/2013  . Diastolic dysfunction 17/51/0258  . Hoarseness 12/22/2012  . Epistaxis, recurrent 07/08/2012  . GERD (gastroesophageal reflux disease) 09/20/2011  . Pulmonary nodule 09/20/2011  . COPD (chronic  obstructive pulmonary disease) ([place]) 04/04/2011  . Diabetes mellitus type 2, controlled ([place]) 04/04/2011  . Hypertension 04/04/2011  . Obstructive sleep apnea 04/04/2011  . Chronic allergic rhinitis 04/04/2011    Past Surgical History:  Procedure Laterality Date  . CARDIAC CATHETERIZATION    . CARDIAC CATHETERIZATION N/A 12/03/2016   Procedure: Left Heart Cath and Coronary Angiography;  Surgeon: Peter M Martinique, MD;  Location: [facility];  Service: Cardiovascular;  Laterality: N/A;  . CHOLECYSTECTOMY N/A 12/13/2015   Procedure: LAPAROSCOPIC CHOLECYSTECTOMY;  Surgeon: Ralene Ok, MD;  Location: [facility];  Service: General;  Laterality: N/A;  . COLONOSCOPY  09/05/2012   Procedure: COLONOSCOPY;  Surgeon: Beryle Beams, MD;  Location: [facility];  Service: Endoscopy;  Laterality: N/A;  . COLONOSCOPY WITH PROPOFOL N/A 09/02/2015   Procedure: COLONOSCOPY WITH PROPOFOL;  Surgeon: Carol Ada, MD;  Location: [facility];  Service: Endoscopy;  Laterality: N/A;  . TUBAL LIGATION      OB History    No data available       Home Medications    Prior to Admission medications   Medication Sig Start Date End Date Taking? Authorizing Provider  albuterol (PROVENTIL) (2.5 MG/3ML) 0.083% nebulizer solution Take 2.5 mg by nebulization 4 (four) times daily.     Historical Provider, MD  allopurinol (ZYLOPRIM) 100 MG tablet Take 100 mg by mouth daily.    Historical Provider, MD  ALPRAZolam Duanne Moron) 1 MG tablet Take 1 mg by mouth 2 (two) times daily as needed for anxiety.  04/13/16   Historical Provider, MD  AMITIZA 24 MCG capsule Take 1 capsule by mouth daily. 07/13/16   Historical Provider, MD  amLODipine (NORVASC) 2.5 MG tablet Take 1 tablet (2.5 mg total) by mouth daily. 04/26/16   Liliane Shi, PA-C  aspirin EC 81 MG tablet Take 81 mg by mouth daily.    Historical Provider, MD  budesonide-formoterol (SYMBICORT) 160-4.5 MCG/ACT inhaler Inhale 2 puffs into the lungs 2 (two) times daily.  05/03/15   Collene Gobble, MD  fluticasone (FLONASE) 50 MCG/ACT nasal spray Place 1 spray into both nostrils daily. 03/24/16   Belkys A Regalado, MD  furosemide (LASIX) 40 MG tablet Take 1 tablet (40 mg total) by mouth daily. 12/05/16   Arbutus Leas, NP  glipiZIDE (GLUCOTROL) 10 MG tablet Take 10 mg by mouth 2 (two) times daily before a meal.     Historical Provider, MD  HYDROcodone-acetaminophen (NORCO) 7.5-325 MG tablet Take 1 tablet by mouth every 6 (six) hours as needed for moderate pain.    Historical Provider, MD  JANUVIA 100 MG tablet Take 100 mg by mouth daily. 05/24/16   Historical Provider, MD  Linaclotide Rolan Lipa) 145 MCG CAPS capsule Take 145 mcg by mouth daily.     Historical Provider, MD  loratadine (CLARITIN) 10 MG tablet Take 10 mg by mouth daily.    Historical Provider, MD  nystatin cream (MYCOSTATIN) Apply 1 application topically 2 (two) times daily as needed (yeast infection.).  Historical Provider, MD  ondansetron (ZOFRAN) 4 MG tablet Take 1 tablet (4 mg total) by mouth every 6 (six) hours as needed for nausea or vomiting. 05/30/16   Bethany Molt, DO  OXYGEN Inhale 2.5 L into the lungs continuous.    Historical Provider, MD  pantoprazole (PROTONIX) 40 MG tablet Take 1 tablet (40 mg total) by mouth daily at 12 noon. 07/22/16   Debbe Odea, MD  pioglitazone (ACTOS) 30 MG tablet Take 30 mg by mouth daily. 12/29/15   Historical Provider, MD  Potassium Chloride ER 20 MEQ TBCR Take 20 mEq by mouth daily. 12/04/16   Arbutus Leas, NP  pravastatin (PRAVACHOL) 20 MG tablet Take 20 mg by mouth daily.    Historical Provider, MD  pregabalin (LYRICA) 75 MG capsule Take 75 mg by mouth 2 (two) times daily.    Historical Provider, MD  PROAIR HFA 108 (90 Base) MCG/ACT inhaler INHALE 2 PUFFS BY MOUTH EVERY 4 HOURS AS NEEDED FOR WHEEZING 06/14/16   Collene Gobble, MD  sodium chloride (OCEAN) 0.65 % SOLN nasal spray Place 1 spray into both nostrils as needed for congestion.    Historical Provider, MD    spironolactone (ALDACTONE) 25 MG tablet Take 0.5 tablets (12.5 mg total) by mouth daily. 12/05/16   Arbutus Leas, NP  tiotropium (SPIRIVA) 18 MCG inhalation capsule Place 1 capsule (18 mcg total) into inhaler and inhale daily. 05/03/15   Collene Gobble, MD  triamterene-hydrochlorothiazide (DYAZIDE) 37.5-25 MG per capsule Take 1 capsule by mouth daily.    Historical Provider, MD  zolpidem (AMBIEN) 10 MG tablet Take 10 mg by mouth at bedtime.    Historical Provider, MD    Family History Family History  Problem Relation Age of Onset  . Other Father     unaware of father's medical history  . Diabetes Mother     alive @ 39  . Myasthenia gravis Mother   . Lung cancer Paternal Aunt   . Lung cancer Paternal Grandfather   . Other      multiple siblings a&w.  . Heart attack Neg Hx   . Heart failure Neg Hx     Social History Social History  Substance Use Topics  . Smoking status: Former Smoker    Packs/day: 0.50    Years: 36.00    Types: Cigarettes    Quit date: 11/11/2015  . Smokeless tobacco: Never Used     Comment: Approx 90 pk-yrs (up to 3ppd until ~ 2009). Smoking 3 cigs per day now.  . Alcohol use No     Allergies   Patient has no known allergies.   Review of Systems Review of Systems  Constitutional: Negative for chills and fever.  HENT: Negative for ear pain and sore throat.   Eyes: Negative for pain and visual disturbance.  Respiratory: Positive for cough (mild intermittent chronic) and shortness of breath (chronic, 2L [place] AAT).   Cardiovascular: Positive for chest pain. Negative for palpitations, leg swelling and near-syncope.  Gastrointestinal: Negative for abdominal pain, nausea and vomiting.  Genitourinary: Negative for dysuria and hematuria.  Musculoskeletal: Negative for arthralgias and back pain.  Skin: Negative for color change and rash.  Neurological: Negative for dizziness, seizures and syncope.  All other systems reviewed and are negative.    Physical  Exam Updated Vital Signs LMP 06/04/2000 (LMP Unknown)   Physical Exam  Constitutional: She is oriented to person, place, and time. She appears well-developed and well-nourished. No distress.  HENT:  Head:  Normocephalic and atraumatic.  Eyes: Conjunctivae are normal.  Neck: Neck supple.  Cardiovascular: Normal rate and regular rhythm.   No murmur heard. Pulmonary/Chest: Effort normal and breath sounds normal. No stridor. No respiratory distress.  On home 2L [place]  Abdominal: Soft. There is no tenderness.  Musculoskeletal: She exhibits no edema.  Neurological: She is alert and oriented to person, place, and time.  Skin: Skin is warm and dry.  Psychiatric: She has a normal mood and affect. Her behavior is normal.  Nursing note and vitals reviewed.    ED Treatments / Results  Labs (all labs ordered are listed, but only abnormal results are displayed) Labs Reviewed - No data to display  EKG  EKG Interpretation None       Radiology No results found.  Procedures Procedures (including critical care time)  Medications Ordered in ED Medications - No data to display   Initial Impression / Assessment and Plan / ED Course  I have reviewed the triage vital signs and the nursing notes.  Pertinent labs & imaging results that were available during my care of the patient were reviewed by me and considered in my medical decision making (see chart for details).    Pt with h/o HTN, HLD, CHF, COPD (on 2L [place] AAT), DM, & anxiety presents with hyperglycemia & CP. Says her sugars have been elevated since last night and she isn't sure why; she decided to call EMS today to have them check her sugars in the event that her glucometer was malfunctioning. Prior to EMS arrival, the Pt used one of her daily breathing treatments and then started to feel an aching, substernal pressure sensation that she rated 4-5/10 in severity; there was no radiation, or associated N/V, diaphoresis, lightheadedness, or  increased SOB. When the medics arrived, they gave her 1xNTG which improved her pain to 2/10. Endorses several days of constipation, but denies F/C, N/V/D, urinary symptoms, or recent illness. Of note, the Pt was admitted in Jan '18 for CP/SOB & had a normal LHC; chart review shows she also had betamethasone injections to both ankles yesterday.  VS & exam as above. EKG: SR @ 82bpm w/o signs of ischemia. HEART score 5. CXR WNL. Labs remarkable for glucose 386 w/normal HCO3 & AG. BNP 21.6 & troponin undetectable. Hyperglycemia likely related to steroid injections, given the Pt denies any other changes to her day-to-day routine over the last few weeks.  Cardiology consulted and evaluated the Pt in the ED; they do not believe she requires admission from a cardiac standpoint, given her recent workup. Cause of her CP unclear, but doubt emergent etiology given history, exam, and work-up.  Vital results of the patient. We'll discharge the patient home. Recommend follow with PCP. Patient knowledge understanding of, and concurs with the plan. All questions answered to her satisfaction. In stable condition at the time of discharge.  Final Clinical Impressions(s) / ED Diagnoses   Final diagnoses:  Hyperglycemia  Chest pain, unspecified type    New Prescriptions New Prescriptions   No medications on file     Jenny Reichmann, MD 02/19/17 [place], MD 02/19/17 2013

## 2017-02-19 NOTE — ED Triage Notes (Signed)
Pt here from home with c/o chest pain , sob and nausea , pt also has hyperglycemia

## 2017-03-01 ENCOUNTER — Encounter (HOSPITAL_COMMUNITY): Payer: Self-pay | Admitting: Neurology

## 2017-03-01 ENCOUNTER — Emergency Department (HOSPITAL_COMMUNITY)
Admission: EM | Admit: 2017-03-01 | Discharge: 2017-03-01 | Disposition: A | Discharge status: Home / Self Care | Payer: Medicaid Other | Attending: Emergency Medicine | Admitting: Emergency Medicine

## 2017-03-01 ENCOUNTER — Emergency Department (HOSPITAL_COMMUNITY): Payer: Medicaid Other

## 2017-03-01 DIAGNOSIS — Z87891 Personal history of nicotine dependence: Secondary | ICD-10-CM | POA: Diagnosis not present

## 2017-03-01 DIAGNOSIS — Z7982 Long term (current) use of aspirin: Secondary | ICD-10-CM | POA: Diagnosis not present

## 2017-03-01 DIAGNOSIS — Z79899 Other long term (current) drug therapy: Secondary | ICD-10-CM | POA: Insufficient documentation

## 2017-03-01 DIAGNOSIS — I5032 Chronic diastolic (congestive) heart failure: Secondary | ICD-10-CM | POA: Insufficient documentation

## 2017-03-01 DIAGNOSIS — K2970 Gastritis, unspecified, without bleeding: Secondary | ICD-10-CM | POA: Insufficient documentation

## 2017-03-01 DIAGNOSIS — J449 Chronic obstructive pulmonary disease, unspecified: Secondary | ICD-10-CM | POA: Insufficient documentation

## 2017-03-01 DIAGNOSIS — Z8541 Personal history of malignant neoplasm of cervix uteri: Secondary | ICD-10-CM | POA: Insufficient documentation

## 2017-03-01 DIAGNOSIS — Z7984 Long term (current) use of oral hypoglycemic drugs: Secondary | ICD-10-CM | POA: Diagnosis not present

## 2017-03-01 DIAGNOSIS — I110 Hypertensive heart disease with heart failure: Secondary | ICD-10-CM | POA: Insufficient documentation

## 2017-03-01 DIAGNOSIS — E1140 Type 2 diabetes mellitus with diabetic neuropathy, unspecified: Secondary | ICD-10-CM | POA: Insufficient documentation

## 2017-03-01 DIAGNOSIS — R1011 Right upper quadrant pain: Secondary | ICD-10-CM | POA: Diagnosis present

## 2017-03-01 LAB — URINALYSIS, ROUTINE W REFLEX MICROSCOPIC
BACTERIA UA: NONE SEEN
Bilirubin Urine: NEGATIVE
Glucose, UA: NEGATIVE mg/dL
KETONES UR: NEGATIVE mg/dL
Leukocytes, UA: NEGATIVE
Nitrite: NEGATIVE
PROTEIN: NEGATIVE mg/dL
Specific Gravity, Urine: >1.046 — ABNORMAL HIGH (ref 1.005–1.030)
pH: 5 (ref 5.0–8.0)

## 2017-03-01 LAB — I-STAT CG4 LACTIC ACID, ED: LACTIC ACID, VENOUS: 2.13 mmol/L — AB (ref 0.5–1.9)

## 2017-03-01 LAB — COMPREHENSIVE METABOLIC PANEL
ALBUMIN: 4 g/dL (ref 3.5–5.0)
ALK PHOS: 54 U/L (ref 38–126)
ALT: 70 U/L — ABNORMAL HIGH (ref 14–54)
ANION GAP: 13 (ref 5–15)
AST: 60 U/L — ABNORMAL HIGH (ref 15–41)
BUN: 15 mg/dL (ref 6–20)
CALCIUM: 9.5 mg/dL (ref 8.9–10.3)
CHLORIDE: 101 mmol/L (ref 101–111)
CO2: 26 mmol/L (ref 22–32)
Creatinine, Ser: 1.07 mg/dL — ABNORMAL HIGH (ref 0.44–1.00)
GFR calc Af Amer: >60 mL/min (ref >60)
GFR calc non Af Amer: 56 mL/min — ABNORMAL LOW (ref >60)
GLUCOSE: 162 mg/dL — AB (ref 65–99)
Potassium: 4 mmol/L (ref 3.5–5.1)
SODIUM: 140 mmol/L (ref 135–145)
Total Bilirubin: 0.3 mg/dL (ref 0.3–1.2)
Total Protein: 7.9 g/dL (ref 6.5–8.1)

## 2017-03-01 LAB — CBC
HEMATOCRIT: 37.3 % (ref 36.0–46.0)
HEMOGLOBIN: 12.8 g/dL (ref 12.0–15.0)
MCH: 28.1 pg (ref 26.0–34.0)
MCHC: 34.3 g/dL (ref 30.0–36.0)
MCV: 81.8 fL (ref 78.0–100.0)
Platelets: 230 10*3/uL (ref 150–400)
RBC: 4.56 MIL/uL (ref 3.87–5.11)
RDW: 14.5 % (ref 11.5–15.5)
WBC: 11.8 10*3/uL — ABNORMAL HIGH (ref 4.0–10.5)

## 2017-03-01 LAB — LIPASE, BLOOD: Lipase: 19 U/L (ref 11–51)

## 2017-03-01 MED ORDER — ONDANSETRON 4 MG PO TBDP: 4.0000 mg | ORAL_TABLET | Freq: Three times a day (TID) | ORAL | 0 refills | Status: DC | PRN

## 2017-03-01 MED ORDER — ONDANSETRON HCL 4 MG/2ML IJ SOLN
4.0000 mg | Freq: Once | INTRAMUSCULAR | Status: AC
  Administered 2017-03-01: 4 mg via INTRAVENOUS
  Filled 2017-03-01: qty 2

## 2017-03-01 MED ORDER — METOCLOPRAMIDE HCL 5 MG/ML IJ SOLN
10.0000 mg | Freq: Once | INTRAMUSCULAR | Status: AC
  Administered 2017-03-01: 10 mg via INTRAVENOUS
  Filled 2017-03-01: qty 2

## 2017-03-01 MED ORDER — IOPAMIDOL (ISOVUE-300) INJECTION 61%
INTRAVENOUS | Status: AC
  Administered 2017-03-01: 100 mL
  Filled 2017-03-01: qty 100

## 2017-03-01 MED ORDER — SODIUM CHLORIDE 0.9 % IV BOLUS (SEPSIS)
1000.0000 mL | Freq: Once | INTRAVENOUS | Status: AC
  Administered 2017-03-01: 1000 mL via INTRAVENOUS

## 2017-03-01 MED ORDER — FAMOTIDINE IN NACL 20-0.9 MG/50ML-% IV SOLN
20.0000 mg | Freq: Once | INTRAVENOUS | Status: AC
  Administered 2017-03-01: 20 mg via INTRAVENOUS
  Filled 2017-03-01: qty 50

## 2017-03-01 MED ORDER — HYDROMORPHONE HCL 1 MG/ML IJ SOLN
0.5000 mg | Freq: Once | INTRAMUSCULAR | Status: AC
  Administered 2017-03-01: 0.5 mg via INTRAVENOUS
  Filled 2017-03-01: qty 1

## 2017-03-01 MED ORDER — ONDANSETRON HCL 4 MG/2ML IJ SOLN
4.0000 mg | Freq: Once | INTRAMUSCULAR | Status: AC | PRN
  Administered 2017-03-01: 4 mg via INTRAVENOUS
  Filled 2017-03-01: qty 2

## 2017-03-01 NOTE — ED Provider Notes (Signed)
[facility] DEPT Provider Note   CSN: [id number] Arrival date & time: 03/01/17  4174     History   Chief Complaint Chief Complaint  Patient presents with  . Abdominal Pain    HPI Stephanie Schultz is a 61 y.o. female.  Patient is a 61 year old female with a history of diabetes, COPD on oxygen therapy, diastolic CHF, anemia, hypertension, that is postcholecystectomy presenting today with worsening abdominal pain and vomiting.  Patient states that she has had upper abdominal pain for the last 4-5 months with chronic nausea. She has mentioned this to her doctor but not had any further evaluation with GI. She states initially the pain improved after having her gallbladder removed 2 years ago but it has recently come back in the last 6 months. However this morning at 6 AM she woke up with multiple episodes of vomiting. The pain in her epigastric and right upper quadrant area is persistent and crampy and sharp in nature. It does not radiate into her back. She denies any urinary symptoms. She did have a loose stool this morning as well. She had not eaten before the pain started. She denies any fever. She has a chronic cough and shortness of breath but feels they are no different than normal. She denies any chest pain.   The history is provided by the patient.  Abdominal Pain      Past Medical History:  Diagnosis Date  . Anemia    chronis  . Anxiety   . Arthritis   . Cervical cancer ([place]) 1990   cervical   . Chronic diastolic CHF (congestive heart failure) ([place]) 08/10/2016  . COPD (chronic obstructive pulmonary disease) (HCC)    Pulmo: [name]  . Diabetes mellitus without complication ([place])    a. A1c 8.3 in 11/2015  . Emphysema   . Gallstones    s/p cholecystectomy  . GERD (gastroesophageal reflux disease)   . Gout   . History of home oxygen therapy    "2.5L; 24/7" (08/10/2016)  . Hx of cardiac catheterization    a. LHC at [facility] in [place], [place] 09/2008:  Normal coronary arteries EF  70%. b. LHC 11/2016: normal cors, normal LVEDP, EF 55-65%.  . Hyperlipidemia   . Hypertension   . Morbid obesity ([place])   . OSA on CPAP    CPAP at night   . Pneumonia   . Sickle cell trait ([place])   . Supplemental oxygen dependent    2L CONTINUOUSLEY  . Tobacco abuse    a. up to 3ppd from age 42 to 13, now 1/4 ppd (01/2013) >> Quit 10/2015    Patient Active Problem List   Diagnosis Date Noted  . Chronic diastolic CHF (congestive heart failure) ([place]) 08/10/2016  . Acute bronchitis 07/22/2016  . Acute respiratory failure ([place]) 07/20/2016  . COPD exacerbation ([place]) 01/23/2016  . S/P laparoscopic cholecystectomy 12/13/2015  . Chronic respiratory failure ([place]) 03/31/2015  . Rib fracture 03/31/2015  . Acute respiratory failure with hypoxia ([place]) 02/17/2015  . Arthritis 12/13/2014  . Gout 03/03/2014  . Diabetic neuropathy ([place]) 03/03/2014  . Carpal tunnel syndrome 03/03/2014  . DM type 2 (diabetes mellitus, type 2) ([place]) 05/22/2013  . Anxiety 05/22/2013  . Transaminasemia 05/09/2013  . Leukocytosis 04/27/2013  . Tobacco abuse   . Chest pain   . Sickle cell trait ([place])   . Morbid obesity ([place])   . Acute on chronic respiratory failure ([place]) 01/16/2013  . Diastolic dysfunction 06/25/4817  . Hoarseness 12/22/2012  .  Epistaxis, recurrent 07/08/2012  . GERD (gastroesophageal reflux disease) 09/20/2011  . Pulmonary nodule 09/20/2011  . COPD (chronic obstructive pulmonary disease) ([place]) 04/04/2011  . Diabetes mellitus type 2, controlled ([place]) 04/04/2011  . Hypertension 04/04/2011  . Obstructive sleep apnea 04/04/2011  . Chronic allergic rhinitis 04/04/2011    Past Surgical History:  Procedure Laterality Date  . CARDIAC CATHETERIZATION    . CARDIAC CATHETERIZATION N/A 12/03/2016   Procedure: Left Heart Cath and Coronary Angiography;  Surgeon: Peter M Martinique, MD;  Location: [facility];  Service: Cardiovascular;  Laterality: N/A;  . CHOLECYSTECTOMY N/A 12/13/2015   Procedure:  LAPAROSCOPIC CHOLECYSTECTOMY;  Surgeon: Ralene Ok, MD;  Location: [facility];  Service: General;  Laterality: N/A;  . COLONOSCOPY  09/05/2012   Procedure: COLONOSCOPY;  Surgeon: Beryle Beams, MD;  Location: [facility];  Service: Endoscopy;  Laterality: N/A;  . COLONOSCOPY WITH PROPOFOL N/A 09/02/2015   Procedure: COLONOSCOPY WITH PROPOFOL;  Surgeon: Carol Ada, MD;  Location: [facility];  Service: Endoscopy;  Laterality: N/A;  . TUBAL LIGATION      OB History    No data available       Home Medications    Prior to Admission medications   Medication Sig Start Date End Date Taking? Authorizing Provider  albuterol (PROVENTIL) (2.5 MG/3ML) 0.083% nebulizer solution Take 2.5 mg by nebulization 4 (four) times daily.     Historical Provider, MD  allopurinol (ZYLOPRIM) 100 MG tablet Take 100 mg by mouth daily.    Historical Provider, MD  ALPRAZolam Duanne Moron) 1 MG tablet Take 1 mg by mouth 2 (two) times daily as needed for anxiety.  04/13/16   Historical Provider, MD  AMITIZA 24 MCG capsule Take 1 capsule by mouth daily. 07/13/16   Historical Provider, MD  amLODipine (NORVASC) 2.5 MG tablet Take 1 tablet (2.5 mg total) by mouth daily. 04/26/16   Liliane Shi, PA-C  aspirin EC 81 MG tablet Take 81 mg by mouth daily.    Historical Provider, MD  budesonide-formoterol (SYMBICORT) 160-4.5 MCG/ACT inhaler Inhale 2 puffs into the lungs 2 (two) times daily. 05/03/15   Collene Gobble, MD  fluticasone (FLONASE) 50 MCG/ACT nasal spray Place 1 spray into both nostrils daily. 03/24/16   Belkys A Regalado, MD  furosemide (LASIX) 40 MG tablet Take 1 tablet (40 mg total) by mouth daily. 12/05/16   Arbutus Leas, NP  glipiZIDE (GLUCOTROL) 10 MG tablet Take 10 mg by mouth 2 (two) times daily before a meal.     Historical Provider, MD  HYDROcodone-acetaminophen (NORCO) 7.5-325 MG tablet Take 1 tablet by mouth every 6 (six) hours as needed for moderate pain.    Historical Provider, MD  JANUVIA 100 MG tablet Take  100 mg by mouth daily. 05/24/16   Historical Provider, MD  Linaclotide Rolan Lipa) 145 MCG CAPS capsule Take 145 mcg by mouth daily.     Historical Provider, MD  loratadine (CLARITIN) 10 MG tablet Take 10 mg by mouth daily.    Historical Provider, MD  nystatin cream (MYCOSTATIN) Apply 1 application topically 2 (two) times daily as needed (yeast infection.).     Historical Provider, MD  ondansetron (ZOFRAN) 4 MG tablet Take 1 tablet (4 mg total) by mouth every 6 (six) hours as needed for nausea or vomiting. 05/30/16   Bethany Molt, DO  OXYGEN Inhale 2.5 L into the lungs continuous.    Historical Provider, MD  pantoprazole (PROTONIX) 40 MG tablet Take 1 tablet (40 mg total) by mouth  daily at 12 noon. 07/22/16   Debbe Odea, MD  pioglitazone (ACTOS) 30 MG tablet Take 30 mg by mouth daily. 12/29/15   Historical Provider, MD  Potassium Chloride ER 20 MEQ TBCR Take 20 mEq by mouth daily. 12/04/16   Arbutus Leas, NP  pravastatin (PRAVACHOL) 20 MG tablet Take 20 mg by mouth daily.    Historical Provider, MD  pregabalin (LYRICA) 75 MG capsule Take 75 mg by mouth 2 (two) times daily.    Historical Provider, MD  PROAIR HFA 108 (90 Base) MCG/ACT inhaler INHALE 2 PUFFS BY MOUTH EVERY 4 HOURS AS NEEDED FOR WHEEZING 06/14/16   Collene Gobble, MD  sodium chloride (OCEAN) 0.65 % SOLN nasal spray Place 1 spray into both nostrils as needed for congestion.    Historical Provider, MD  spironolactone (ALDACTONE) 25 MG tablet Take 0.5 tablets (12.5 mg total) by mouth daily. 12/05/16   Arbutus Leas, NP  tiotropium (SPIRIVA) 18 MCG inhalation capsule Place 1 capsule (18 mcg total) into inhaler and inhale daily. 05/03/15   Collene Gobble, MD  triamterene-hydrochlorothiazide (DYAZIDE) 37.5-25 MG per capsule Take 1 capsule by mouth daily.    Historical Provider, MD  zolpidem (AMBIEN) 10 MG tablet Take 10 mg by mouth at bedtime.    Historical Provider, MD    Family History Family History  Problem Relation Age of Onset  . Other  Father     unaware of father's medical history  . Diabetes Mother     alive @ 9  . Myasthenia gravis Mother   . Lung cancer Paternal Aunt   . Lung cancer Paternal Grandfather   . Other      multiple siblings a&w.  . Heart attack Neg Hx   . Heart failure Neg Hx     Social History Social History  Substance Use Topics  . Smoking status: Former Smoker    Packs/day: 0.50    Years: 36.00    Types: Cigarettes    Quit date: 11/11/2015  . Smokeless tobacco: Never Used     Comment: Approx 90 pk-yrs (up to 3ppd until ~ 2009). Smoking 3 cigs per day now.  . Alcohol use No     Allergies   Patient has no known allergies.   Review of Systems Review of Systems  Gastrointestinal: Positive for abdominal pain.  All other systems reviewed and are negative.    Physical Exam Updated Vital Signs BP (!) 98/52 (BP Location: Right Arm)   Pulse 98   Temp 98.3 F (36.8 C) (Oral)   Resp 18   LMP 06/04/2000 (LMP Unknown)   SpO2 98%   Physical Exam  Constitutional: She is oriented to person, place, and time. She appears well-developed and well-nourished. She appears distressed.  Looks uncomfortable and vomiting on exam  HENT:  Head: Normocephalic and atraumatic.  Mouth/Throat: Oropharynx is clear and moist.  Eyes: Conjunctivae and EOM are normal. Pupils are equal, round, and reactive to light.  Neck: Normal range of motion. Neck supple.  Cardiovascular: Normal rate, regular rhythm and intact distal pulses.   No murmur heard. Pulmonary/Chest: Effort normal and breath sounds normal. No respiratory distress. She has no wheezes. She has no rales.  Abdominal: Soft. She exhibits no distension. There is tenderness in the right upper quadrant and epigastric area. There is guarding. There is no rebound. No hernia.  Obese abdomen but no notable hernias  Musculoskeletal: Normal range of motion. She exhibits no edema or tenderness.  Neurological: She is  alert and oriented to person, place, and  time.  Skin: Skin is warm and dry. No rash noted. No erythema.  Psychiatric: She has a normal mood and affect. Her behavior is normal.  Nursing note and vitals reviewed.    ED Treatments / Results  Labs (all labs ordered are listed, but only abnormal results are displayed) Labs Reviewed  COMPREHENSIVE METABOLIC PANEL - Abnormal; Notable for the following:       Result Value   Glucose, Bld 162 (*)    Creatinine, Ser 1.07 (*)    AST 60 (*)    ALT 70 (*)    GFR calc non Af Amer 56 (*)    All other components within normal limits  CBC - Abnormal; Notable for the following:    WBC 11.8 (*)    All other components within normal limits  URINALYSIS, ROUTINE W REFLEX MICROSCOPIC - Abnormal; Notable for the following:    Specific Gravity, Urine >1.046 (*)    Hgb urine dipstick SMALL (*)    Squamous Epithelial / LPF 0-5 (*)    All other components within normal limits  I-STAT CG4 LACTIC ACID, ED - Abnormal; Notable for the following:    Lactic Acid, Venous 2.13 (*)    All other components within normal limits  LIPASE, BLOOD    EKG  EKG Interpretation None       Radiology Ct Abdomen Pelvis W Contrast  Result Date: 03/01/2017 CLINICAL DATA:  Upper abdominal pain radiating to the umbilicus since this morning, with nausea. EXAM: CT ABDOMEN AND PELVIS WITH CONTRAST TECHNIQUE: Multidetector CT imaging of the abdomen and pelvis was performed using the standard protocol following bolus administration of intravenous contrast. CONTRAST:  110mL ISOVUE-300 IOPAMIDOL (ISOVUE-300) INJECTION 61% COMPARISON:  06/13/2016 FINDINGS: Lower chest: Chronic intercostal pulmonary herniation at lateral at 8th, 9th rib interspace. Mild atelectasis or scarring at the bases. No acute finding Hepatobiliary: Hepatic steatosis. Capsular calcification near the central ligament, benign.Absent gallbladder. Normal common bile duct diameter. Pancreas: Unremarkable. Spleen: Unremarkable. Adrenals/Urinary Tract: Negative  adrenals. No hydronephrosis or stone. Unremarkable bladder. Stomach/Bowel:  No obstruction. No appendicitis. Vascular/Lymphatic: No acute vascular abnormality. No mass or adenopathy. Reproductive:Lobulated appearance of the lower uterine segment, suspect 3-4 mm fibroid. Other: Broad fatty umbilical hernia, chronic. No ascites or pneumoperitoneum. Nodular density in the inferior right breast, status post diagnostic sonography 08/07/2016. Musculoskeletal: No acute abnormalities. Spondylosis, disc degeneration, advanced facet arthropathy. Diffuse lumbar spinal stenosis, high-grade at L3-4 especially. Lower thoracic spondylosis results in multilevel ankylosis. IMPRESSION: 1. No acute finding or change from prior to explain abdominal pain. 2.  Aortic Atherosclerosis (ICD10-I70.0). 3. Advanced lumbar disc and facet degeneration with spinal stenosis. 4. Fatty umbilical hernia. 5. Hepatic steatosis and additional stable findings are described above. Electronically Signed   By: Monte Fantasia M.D.   On: 03/01/2017 11:02    Procedures Procedures (including critical care time)  Medications Ordered in ED Medications  ondansetron (ZOFRAN) injection 4 mg (4 mg Intravenous Given 03/01/17 0945)  sodium chloride 0.9 % bolus 1,000 mL (0 mLs Intravenous Stopped 03/01/17 1101)  ondansetron (ZOFRAN) injection 4 mg (4 mg Intravenous Given 03/01/17 1216)  HYDROmorphone (DILAUDID) injection 0.5 mg (0.5 mg Intravenous Given 03/01/17 1001)  iopamidol (ISOVUE-300) 61 % injection (100 mLs  Contrast Given 03/01/17 1043)  famotidine (PEPCID) IVPB 20 mg premix (0 mg Intravenous Stopped 03/01/17 1313)  metoCLOPramide (REGLAN) injection 10 mg (10 mg Intravenous Given 03/01/17 1500)     Initial Impression / Assessment and Plan /  ED Course  I have reviewed the triage vital signs and the nursing notes.  Pertinent labs & imaging results that were available during my care of the patient were reviewed by me and considered in my medical  decision making (see chart for details).     Pt presenting today with persistent abdominal pain and multiple episodes of vomiting.  Seems that patient's upper abdominal pain and nausea has been going on for 4-5 months now. She had pain like this in the past but it improved after having a cholecystectomy. This is the first time she has started vomiting with this. She denies any infectious symptoms. She does note that last week she had food poisoning but this feels different. She did have one loose stool this morning. She denies any respiratory or cardiac symptoms. Concern for possible small bowel obstruction versus retained gallstone choledocholithiasis versus pancreatitis or hepatitis. She denies any urinary symptoms and low suspicion for GU cause of her symptoms today. No prior history of AAA. Patient given pain and nausea control. Lactate mildly elevated at 2.13, CMP with mild elevation of her liver function AST 60 and ALT 70, total bilirubin within normal limits, lipase within normal limits, CBC mild leukocytosis of 11. CT of the abdomen and pelvis pending  3:53 PM CT without acute findings.  Concern that pt may have pill gastritis or PUD.  Will need to follow up with Dr. Benson Norway.  Pt also taking her pills in morning and not eating until afternoon.  Sent home with odt zofran.  Final Clinical Impressions(s) / ED Diagnoses   Final diagnoses:  Gastritis without bleeding, unspecified chronicity, unspecified gastritis type    New Prescriptions New Prescriptions   ONDANSETRON (ZOFRAN ODT) 4 MG DISINTEGRATING TABLET    Take 1 tablet (4 mg total) by mouth every 8 (eight) hours as needed for nausea or vomiting.     Blanchie Dessert, MD 03/01/17 [phone]

## 2017-03-01 NOTE — ED Triage Notes (Signed)
Per ems- pt comes from home, woke up at 0600, feeling LLQ abdominal pain. n/v x 1. BM today it was normal. Last Thursday had food poisoning. BP 170/96, HR 88, RR 20. CBG 162. 96% 2 L all the time.

## 2017-03-01 NOTE — ED Notes (Signed)
PT did not have to use RR at this time  

## 2017-03-01 NOTE — ED Notes (Signed)
Patient transported to CT 

## 2017-03-18 IMAGING — MG 2D DIGITAL DIAGNOSTIC BILATERAL MAMMOGRAM WITH CAD AND ADJUNCT T
8 of 15 series · 8 of 31 positions shown · non-contrast
Comparison: Previous exam(s).

CLINICAL DATA: 59-year-old female referred for an enlarging right
breast nodule seen on recent abdominal CT dated 06/13/2016.

EXAM:
2D DIGITAL DIAGNOSTIC BILATERAL MAMMOGRAM WITH CAD AND ADJUNCT TOMO

[R CV]
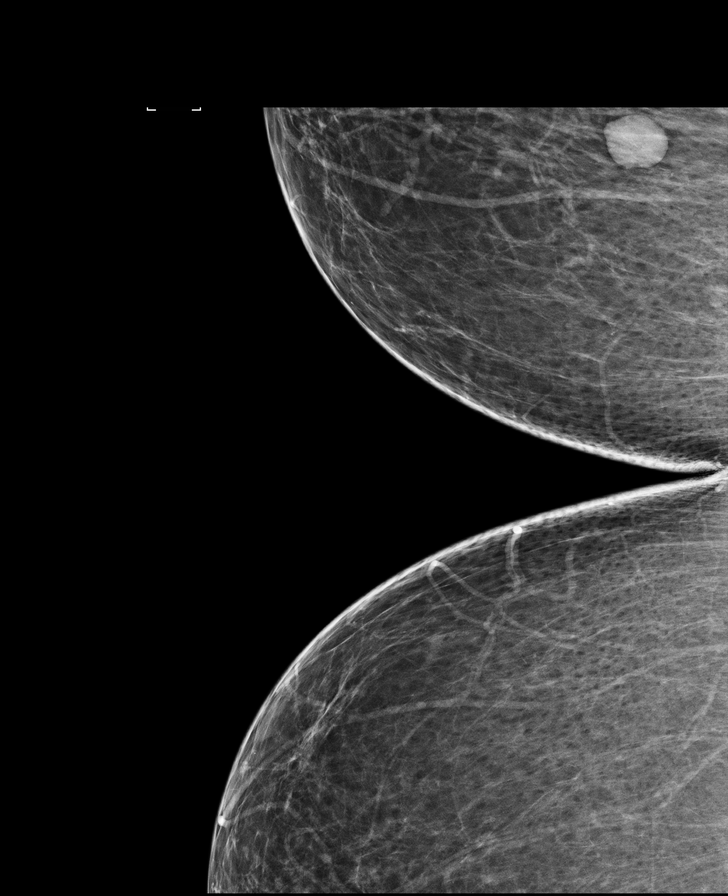

[R MLO (1 of 2)]
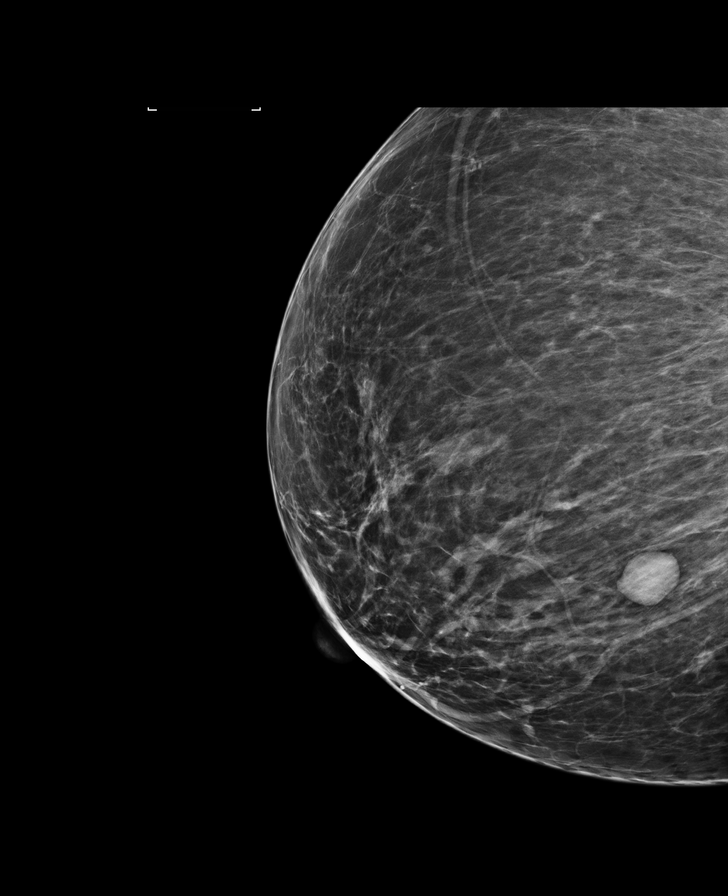

[L MLO]
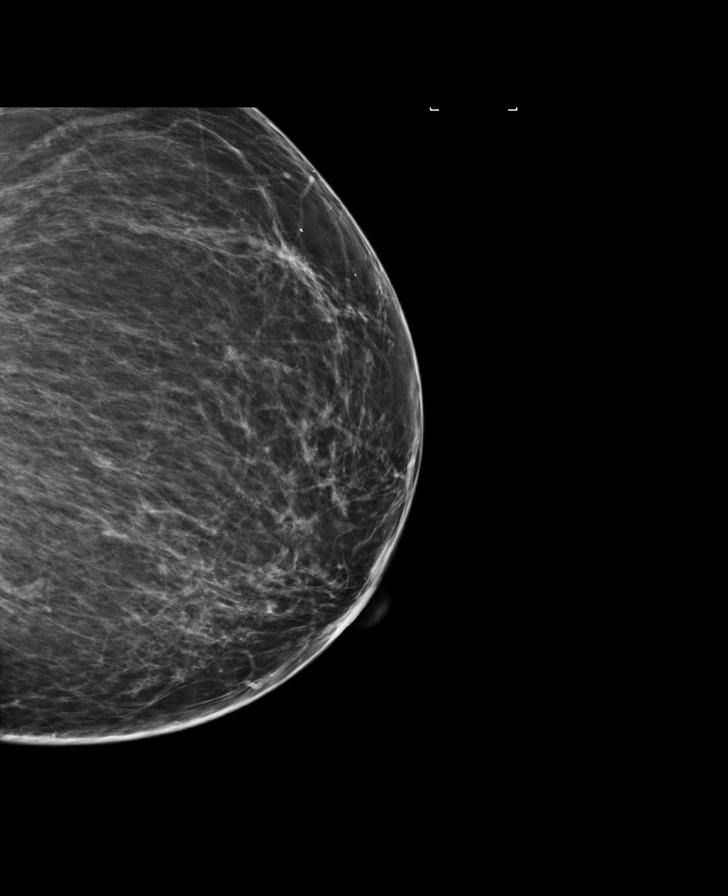

[R MLO (2 of 2)]
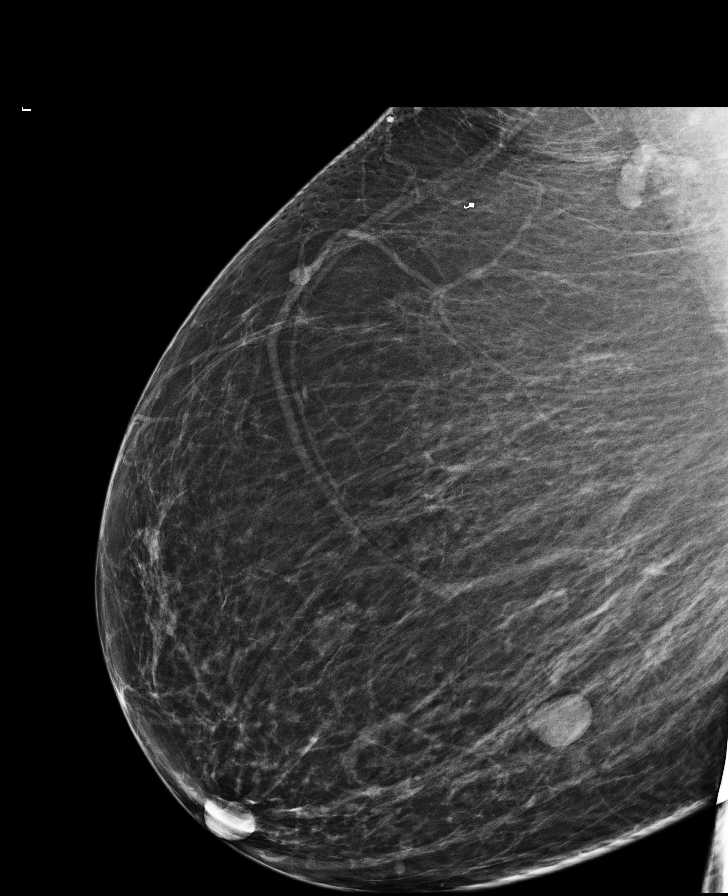

[L MLO synth-2D]
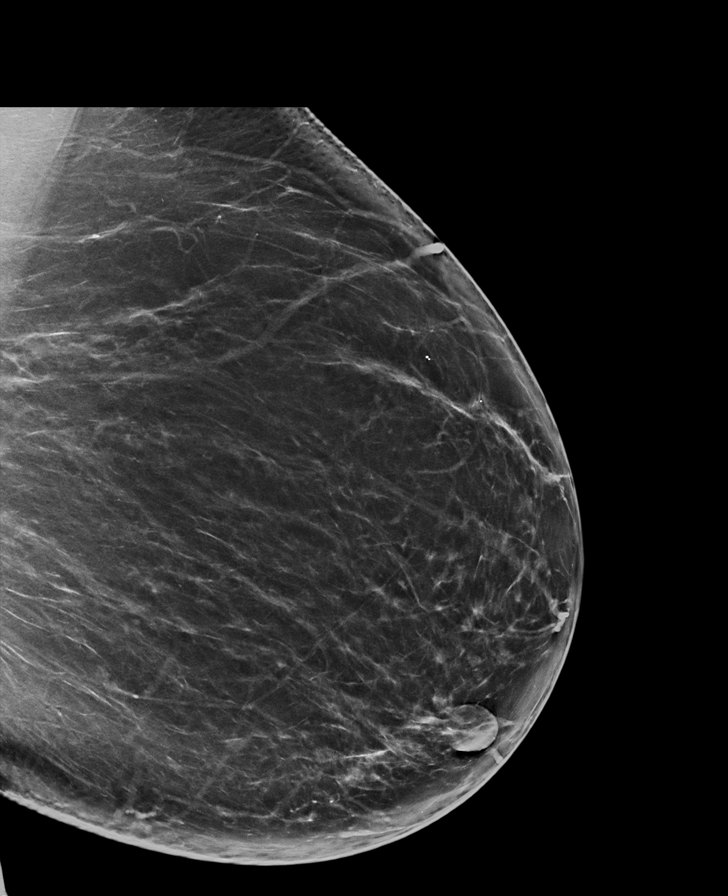

[R CC synth-2D]
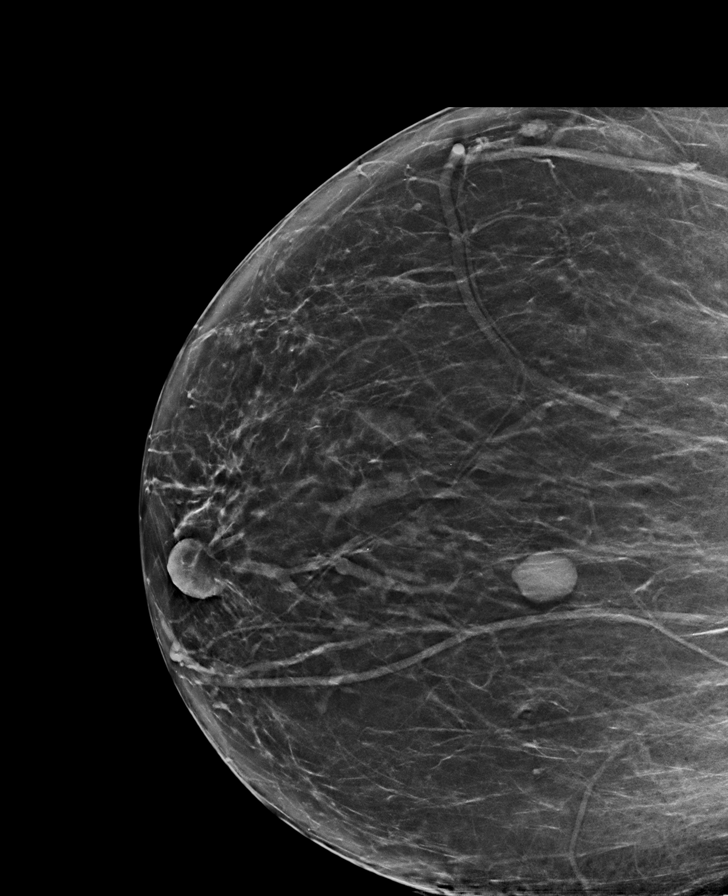

[R MLO synth-2D]
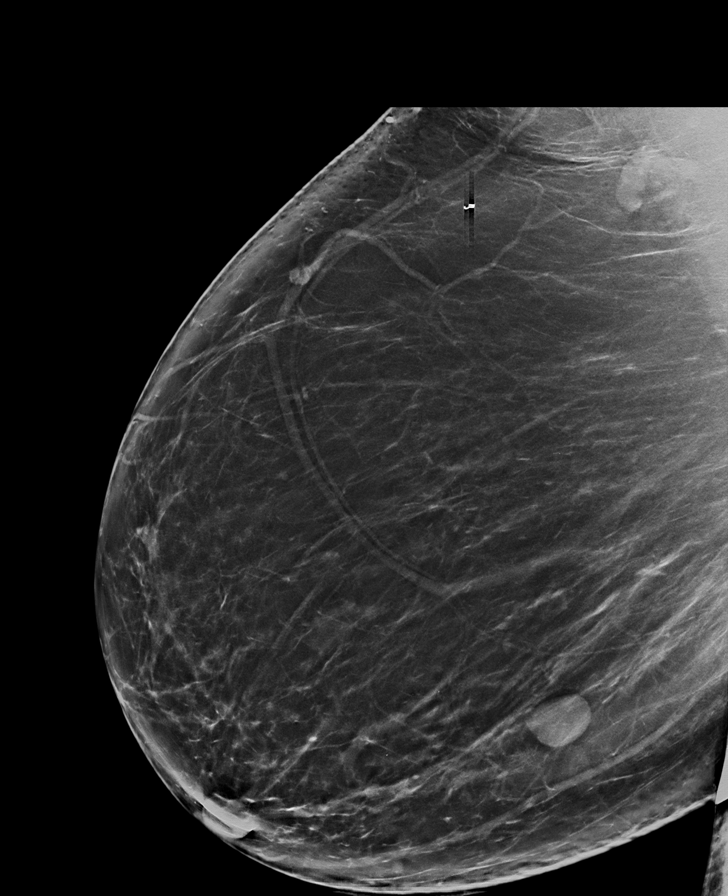

[L CC synth-2D]
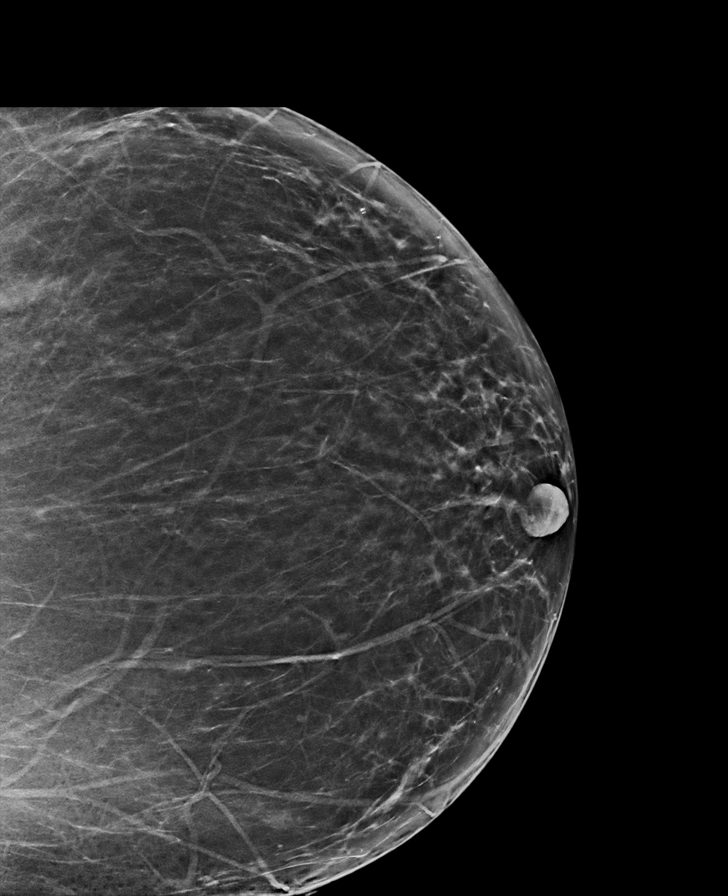

[8 of 31 positions shown; findings below may reference images not displayed]

ACR Breast Density Category b: There are scattered areas of
fibroglandular density.
FINDINGS: There is a 19 mm oval, circumscribed mass within the right breast at
6 o'clock, corresponding to the mass seen on recent CT within the
right breast. No suspicious mass, calcifications, or other
abnormality is identified within the left breast.

Mammographic images were processed with CAD.
IMPRESSION: Incomplete evaluation.

RECOMMENDATION:
A targeted right breast ultrasound is needed for further evaluation
of the right breast mass seen mammographically. An order was not
available for the ultrasound to be performed today.

The patient noted that she was short of breath during her time at
the [REDACTED] and stated that she thought she needed to be seen
in the emergency room. Transport was called and the patient was
transported to the emergency room.

I have discussed the findings and recommendations with the patient.
Results were also provided in writing at the conclusion of the
visit. If applicable, a reminder letter will be sent to the patient
regarding the next appointment.

BI-RADS CATEGORY  0: Incomplete. Need additional imaging evaluation
and/or prior mammograms for comparison.

## 2017-03-20 ENCOUNTER — Other Ambulatory Visit: Payer: Self-pay | Admitting: Gastroenterology

## 2017-03-28 ENCOUNTER — Encounter (HOSPITAL_COMMUNITY): Payer: Self-pay

## 2017-03-29 ENCOUNTER — Ambulatory Visit (HOSPITAL_COMMUNITY): Payer: Medicaid Other | Admitting: Certified Registered Nurse Anesthetist

## 2017-03-29 ENCOUNTER — Ambulatory Visit (HOSPITAL_COMMUNITY)
Admission: RE | Admit: 2017-03-29 | Discharge: 2017-03-29 | Disposition: A | Discharge status: Home / Self Care | Payer: Medicaid Other | Source: Ambulatory Visit | Attending: Gastroenterology | Admitting: Gastroenterology

## 2017-03-29 ENCOUNTER — Encounter (HOSPITAL_COMMUNITY): Payer: Self-pay | Admitting: *Deleted

## 2017-03-29 ENCOUNTER — Encounter (HOSPITAL_COMMUNITY)
Admission: RE | Disposition: A | Discharge status: Home / Self Care | Payer: Self-pay | Source: Ambulatory Visit | Attending: Gastroenterology

## 2017-03-29 DIAGNOSIS — D573 Sickle-cell trait: Secondary | ICD-10-CM | POA: Insufficient documentation

## 2017-03-29 DIAGNOSIS — I110 Hypertensive heart disease with heart failure: Secondary | ICD-10-CM | POA: Diagnosis not present

## 2017-03-29 DIAGNOSIS — F419 Anxiety disorder, unspecified: Secondary | ICD-10-CM | POA: Diagnosis not present

## 2017-03-29 DIAGNOSIS — K429 Umbilical hernia without obstruction or gangrene: Secondary | ICD-10-CM | POA: Insufficient documentation

## 2017-03-29 DIAGNOSIS — Z8541 Personal history of malignant neoplasm of cervix uteri: Secondary | ICD-10-CM | POA: Diagnosis not present

## 2017-03-29 DIAGNOSIS — I5032 Chronic diastolic (congestive) heart failure: Secondary | ICD-10-CM | POA: Insufficient documentation

## 2017-03-29 DIAGNOSIS — E785 Hyperlipidemia, unspecified: Secondary | ICD-10-CM | POA: Diagnosis not present

## 2017-03-29 DIAGNOSIS — G4733 Obstructive sleep apnea (adult) (pediatric): Secondary | ICD-10-CM | POA: Insufficient documentation

## 2017-03-29 DIAGNOSIS — R112 Nausea with vomiting, unspecified: Secondary | ICD-10-CM | POA: Diagnosis not present

## 2017-03-29 DIAGNOSIS — E119 Type 2 diabetes mellitus without complications: Secondary | ICD-10-CM | POA: Diagnosis not present

## 2017-03-29 DIAGNOSIS — Z9981 Dependence on supplemental oxygen: Secondary | ICD-10-CM | POA: Diagnosis not present

## 2017-03-29 DIAGNOSIS — Z79899 Other long term (current) drug therapy: Secondary | ICD-10-CM | POA: Diagnosis not present

## 2017-03-29 DIAGNOSIS — Z6841 Body Mass Index (BMI) 40.0 and over, adult: Secondary | ICD-10-CM | POA: Diagnosis not present

## 2017-03-29 DIAGNOSIS — Z9049 Acquired absence of other specified parts of digestive tract: Secondary | ICD-10-CM | POA: Diagnosis not present

## 2017-03-29 DIAGNOSIS — K219 Gastro-esophageal reflux disease without esophagitis: Secondary | ICD-10-CM | POA: Insufficient documentation

## 2017-03-29 DIAGNOSIS — Z87891 Personal history of nicotine dependence: Secondary | ICD-10-CM | POA: Diagnosis not present

## 2017-03-29 DIAGNOSIS — J449 Chronic obstructive pulmonary disease, unspecified: Secondary | ICD-10-CM | POA: Diagnosis not present

## 2017-03-29 DIAGNOSIS — M109 Gout, unspecified: Secondary | ICD-10-CM | POA: Insufficient documentation

## 2017-03-29 HISTORY — PX: ESOPHAGOGASTRODUODENOSCOPY (EGD) WITH PROPOFOL: SHX5813

## 2017-03-29 LAB — GLUCOSE, CAPILLARY: Glucose-Capillary: 160 mg/dL — ABNORMAL HIGH (ref 65–99)

## 2017-03-29 SURGERY — ESOPHAGOGASTRODUODENOSCOPY (EGD) WITH PROPOFOL
Anesthesia: Monitor Anesthesia Care

## 2017-03-29 MED ORDER — LIDOCAINE 2% (20 MG/ML) 5 ML SYRINGE
INTRAMUSCULAR | Status: AC
  Filled 2017-03-29: qty 5

## 2017-03-29 MED ORDER — ONDANSETRON HCL 4 MG/2ML IJ SOLN
INTRAMUSCULAR | Status: DC | PRN
  Administered 2017-03-29: 4 mg via INTRAVENOUS

## 2017-03-29 MED ORDER — FENTANYL CITRATE (PF) 250 MCG/5ML IJ SOLN
INTRAMUSCULAR | Status: DC | PRN
  Administered 2017-03-29 (×2): 50 ug via INTRAVENOUS

## 2017-03-29 MED ORDER — SODIUM CHLORIDE 0.9 % IV SOLN: INTRAVENOUS | Status: DC

## 2017-03-29 MED ORDER — LACTATED RINGERS IV SOLN
INTRAVENOUS | Status: DC
  Administered 2017-03-29: 10:00:00 via INTRAVENOUS

## 2017-03-29 MED ORDER — PROPOFOL 500 MG/50ML IV EMUL
INTRAVENOUS | Status: DC | PRN
  Administered 2017-03-29: 200 ug/kg/min via INTRAVENOUS

## 2017-03-29 MED ORDER — FENTANYL CITRATE (PF) 100 MCG/2ML IJ SOLN
INTRAMUSCULAR | Status: AC
  Filled 2017-03-29: qty 2

## 2017-03-29 MED ORDER — PROPOFOL 10 MG/ML IV BOLUS
INTRAVENOUS | Status: AC
  Filled 2017-03-29: qty 20

## 2017-03-29 MED ORDER — LIDOCAINE HCL (CARDIAC) 20 MG/ML IV SOLN
INTRAVENOUS | Status: DC | PRN
  Administered 2017-03-29: 50 mg via INTRAVENOUS

## 2017-03-29 SURGICAL SUPPLY — 14 items

## 2017-03-29 NOTE — H&P (Signed)
Stephanie Schultz HPI: Over the past 3 months she started to experience nausea and vomiting. Her symptoms progressively worsened to the point that she presented to the ER 3-4 weeks ago. The work up was negative. Her CT scan was unrevealing for any acute findings, but she did have atherosclerosis, fatty umbilical hernia, hepatic steatosis, and intercostal pulmonary herniation. She is s/p lap chole and her liver enzymes are mildly abnormal with minor fluctuations.  Past Medical History:  Diagnosis Date  . Anemia    chronis  . Anxiety   . Arthritis   . Cervical cancer ([facility]phanie Schultz) 1990   cervical   . Chronic diastolic CHF (congestive heart failure) ([facility]) 08/10/2016  . COPD (chronic obstructive pulmonary disease) (H[facility])    Pulmo: Dr. Lamonte Schultz  . Diabetes mellitus without complication ([facility]phan[facility])    a. A1c 8.3 in 11/2015  . Emphysema   . Gallstones    s/p cholecystectomy  . GERD (gastroesophageal reflux disease)   . Gout   . History of home oxygen therapy    "2.5L; 24/7" (08/10/2016)  . Hx of cardiac catheterization    a. LHC at [facility] in [place], [facility] 09/2008:  Normal coronary arteries EF 70%. b. LHC 11/2016: normal cors, normal LVEDP, EF 55-65%.  . Hyperlipidemia   . Hypertension   . Morbid obesity ([facility])   . OSA on CPAP    CPAP at night   . Pneumonia   . Sickle cell trait ([facility]phanie [facility])   . Supplemental oxygen dependent    2L CONTINUOUSLEY  . Tobacco abuse    a. up to 3ppd from age 38 to 40, now 1/4 ppd (01/2013) >> Quit 10/2015    Past Surgical History:  Procedure Laterality Date  . CARDIAC CATHETERIZATION    . CARDIAC CATHETERIZATION N/A 12/03/2016   Procedure: Left Heart Cath and Coronary Angiography;  Surgeon: Stephanie M Martinique, MD;  Location: [facility];  Service: Cardiovascular;  Laterality: N/A;  . CHOLECYSTECTOMY N/A 12/13/2015   Procedure: LAPAROSCOPIC CHOLECYSTECTOMY;  Surgeon: Stephanie Ok, MD;  Location: [facility];  Service: General;  Laterality: N/A;  . COLONOSCOPY  09/05/2012   Procedure: COLONOSCOPY;  Surgeon: Stephanie Beams, MD;  Location: [facility];  Service: [facility];  Laterality: N/A;  . COLONOSCOPY WITH PROPOFOL N/A 09/02/2015   Procedure: COLONOSCOPY WITH PROPOFOL;  Surgeon: Stephanie Ada, MD;  Location: [facility];  Service: [facility];  Laterality: N/A;  . TUBAL LIGATION      Family History  Problem Relation Age of Onset  . Other Father        unaware of father's medical history  . Diabetes Mother        alive @ 87  . Myasthenia gravis Mother   . Lung cancer Paternal Aunt   . Lung cancer Paternal Grandfather   . Other Unknown        multiple siblings a&w.  . Heart attack Neg Hx   . Heart failure Neg Hx     Social History:  reports that she quit smoking about 16 months ago. Her smoking use included Cigarettes. She has a 18.00 pack-year smoking history. She has never used smokeless tobacco. She reports that she does not drink alcohol or use drugs.  Allergies: No Active Allergies  Medications:  Scheduled:  Continuous: . lactated ringers 20 mL/hr at 03/29/17 1006    Results for orders placed or performed during the hospital encounter of 03/29/17 (from the past 24 hour(s))  Glucose, capillary     Status: Abnormal  Collection Time: 03/29/17 10:03 AM  Result Value Ref Range   Glucose-Capillary 160 (H) 65 - 99 mg/dL     No results found.  ROS:  As stated above in the HPI otherwise negative.  Blood pressure 122/67, pulse 79, temperature 98 F (36.7 C), temperature source Oral, resp. rate (!) 21, height 5' (1.524 m), weight 99.8 kg (220 lb), last menstrual period 06/04/2000, SpO2 97 %.    PE: Gen: NAD, Alert and Oriented HEENT:  [place]/AT, EOMI Neck: Supple, no LAD Lungs: CTA Bilaterally CV: RRR without M/G/R ABM: Soft, NTND, +BS, morbidly obese Ext: No C/C/E  Assessment/Plan: 1) Nausea and vomiting - EGD.  Stephanie Schultz D 03/29/2017, 10:11 AM

## 2017-03-29 NOTE — Discharge Instructions (Signed)
Esophagogastroduodenoscopy, Care After °Refer to this sheet in the next few weeks. These instructions provide you with information about caring for yourself after your procedure. Your health care provider may also give you more specific instructions. Your treatment has been planned according to current medical practices, but problems sometimes occur. Call your health care provider if you have any problems or questions after your procedure. °What can I expect after the procedure? °After the procedure, it is common to have: °· A sore throat. °· Nausea. °· Bloating. °· Dizziness. °· Fatigue. °Follow these instructions at home: °· Do not eat or drink anything until the numbing medicine (local anesthetic) has worn off and your gag reflex has returned. You will know that the local anesthetic has worn off when you can swallow comfortably. °· Do not drive for 24 hours if you received a medicine to help you relax (sedative). °· If your health care provider took a tissue sample for testing during the procedure, make sure to get your test results. This is your responsibility. Ask your health care provider or the department performing the test when your results will be ready. °· Keep all follow-up visits as told by your health care provider. This is important. °Contact a health care provider if: °· You cannot stop coughing. °· You are not urinating. °· You are urinating less than usual. °Get help right away if: °· You have trouble swallowing. °· You cannot eat or drink. °· You have throat or chest pain that gets worse. °· You are dizzy or light-headed. °· You faint. °· You have nausea or vomiting. °· You have chills. °· You have a fever. °· You have severe abdominal pain. °· You have black, tarry, or bloody stools. °This information is not intended to replace advice given to you by your health care provider. Make sure you discuss any questions you have with your health care provider. °Document Released: 10/15/2012 Document  Revised: 04/05/2016 Document Reviewed: 09/22/2015 °Elsevier Interactive Patient Education © 2017 [employer] ° °

## 2017-03-29 NOTE — Transfer of Care (Signed)
Immediate Anesthesia Transfer of Care Note  Patient: Stephanie Schultz  Procedure(s) Performed: Procedure(s): ESOPHAGOGASTRODUODENOSCOPY (EGD) WITH PROPOFOL (N/A)  Patient Location: PACU  Anesthesia Type:MAC  Level of Consciousness: awake, alert , oriented and patient cooperative  Airway & Oxygen Therapy: Patient Spontanous Breathing and Patient connected to nasal cannula oxygen  Post-op Assessment: Report given to RN, Post -op Vital signs reviewed and stable and Patient moving all extremities X 4  Post vital signs: stable  Last Vitals:  Vitals:   03/29/17 0945 03/29/17 1147  BP: 122/67 (!) 115/57  Pulse: 79 82  Resp: (!) 21 (!) 25  Temp: 36.7 C 36.6 C    Last Pain:  Vitals:   03/29/17 1147  TempSrc: Oral         Complications: No apparent anesthesia complications

## 2017-03-29 NOTE — Op Note (Signed)
[facility] Patient Name: Stephanie Schultz Procedure Date: 03/29/2017 MRN: [id number] Attending MD: Carol Ada , MD Date of Birth: Jul 19, 1956 CSN: [id number] Age: 61 Admit Type: Outpatient Procedure:                Upper GI endoscopy Indications:              Nausea with vomiting Providers:                Carol Ada, MD, Carmie End, RN, Lillie Fragmin, RN, William Dalton, Technician Referring MD:              Medicines:                Propofol per Anesthesia Complications:            No immediate complications. Estimated Blood Loss:     Estimated blood loss was minimal. Procedure:                Pre-Anesthesia Assessment:                           - Prior to the procedure, a History and Physical                            was performed, and patient medications and                            allergies were reviewed. The patient's tolerance of                            previous anesthesia was also reviewed. The risks                            and benefits of the procedure and the sedation                            options and risks were discussed with the patient.                            All questions were answered, and informed consent                            was obtained. Prior Anticoagulants: The patient has                            taken no previous anticoagulant or antiplatelet                            agents. ASA Grade Assessment: III - A patient with                            severe systemic disease. After reviewing the risks  and benefits, the patient was deemed in                            satisfactory condition to undergo the procedure.                           - Sedation was administered by an anesthesia                            professional. Deep sedation was attained.                           After obtaining informed consent, the endoscope was                            passed  under direct vision. Throughout the                            procedure, the patient's blood pressure, pulse, and                            oxygen saturations were monitored continuously. The                            EG-[id number] ([id number]) scope was introduced through the                            mouth, and advanced to the second part of duodenum.                            The upper GI endoscopy was accomplished without                            difficulty. The patient tolerated the procedure                            well. Scope In: Scope Out: Findings:      The esophagus was normal.      The entire examined stomach was normal. Biopsies were taken with a cold       forceps for histology.      The examined duodenum was normal. Impression:               - Normal esophagus.                           - Normal stomach. Biopsied.                           - Normal examined duodenum. Moderate Sedation:      N/A- Per Anesthesia Care Recommendation:           - Patient has a contact number available for                            emergencies. The signs and symptoms of potential  delayed complications were discussed with the                            patient. Return to normal activities tomorrow.                            Written discharge instructions were provided to the                            patient.                           - Resume previous diet.                           - Continue present medications.                           - Await pathology results.                           - Return to GI clinic in 4 weeks. Procedure Code(s):        --- Professional ---                           [phone], Esophagogastroduodenoscopy, flexible,                            transoral; with biopsy, single or multiple Diagnosis Code(s):        --- Professional ---                           R11.2, Nausea with vomiting, unspecified CPT copyright 2016 American Medical  Association. All rights reserved. The codes documented in this report are preliminary and upon coder review may  be revised to meet current compliance requirements. Carol Ada, MD Carol Ada, MD 03/29/2017 11:38:09 AM This report has been signed electronically. Number of Addenda: 0

## 2017-03-29 NOTE — Anesthesia Preprocedure Evaluation (Signed)
Anesthesia Evaluation  Patient identified by MRN, date of birth, ID band Patient awake    Reviewed: Allergy & Precautions, NPO status , Patient's Chart, lab work & pertinent test results  Airway Mallampati: III   Neck ROM: full    Dental   Pulmonary sleep apnea , COPD,  oxygen dependent, former smoker,    breath sounds clear to auscultation       Cardiovascular hypertension, +CHF   Rhythm:regular Rate:Normal     Neuro/Psych Anxiety    GI/Hepatic GERD  ,  Endo/Other  diabetes, Type 2Morbid obesity  Renal/GU      Musculoskeletal  (+) Arthritis ,   Abdominal   Peds  Hematology  (+) Sickle cell trait ,   Anesthesia Other Findings   Reproductive/Obstetrics                             Anesthesia Physical Anesthesia Plan  ASA: IV  Anesthesia Plan: MAC   Post-op Pain Management:    Induction: Intravenous  Airway Management Planned: Nasal Cannula  Additional Equipment:   Intra-op Plan:   Post-operative Plan:   Informed Consent: I have reviewed the patients History and Physical, chart, labs and discussed the procedure including the risks, benefits and alternatives for the proposed anesthesia with the patient or authorized representative who has indicated his/her understanding and acceptance.     Plan Discussed with: CRNA, Anesthesiologist and Surgeon  Anesthesia Plan Comments:         Anesthesia Quick Evaluation

## 2017-03-31 ENCOUNTER — Encounter (HOSPITAL_COMMUNITY): Payer: Self-pay | Admitting: Gastroenterology

## 2017-04-03 NOTE — Anesthesia Postprocedure Evaluation (Signed)
Anesthesia Post Note  Patient: Stephanie Schultz  Procedure(s) Performed: Procedure(s) (LRB): ESOPHAGOGASTRODUODENOSCOPY (EGD) WITH PROPOFOL (N/A)  Patient location during evaluation: PACU Anesthesia Type: MAC Level of consciousness: awake and alert Pain management: pain level controlled Vital Signs Assessment: post-procedure vital signs reviewed and stable Respiratory status: spontaneous breathing, nonlabored ventilation, respiratory function stable and patient connected to nasal cannula oxygen Cardiovascular status: stable and blood pressure returned to baseline Anesthetic complications: no       Last Vitals:  Vitals:   03/29/17 1200 03/29/17 1210  BP: 124/69 122/61  Pulse: 79 79  Resp: (!) 25 (!) 25  Temp:      Last Pain:  Vitals:   04/01/17 0955  TempSrc:   PainSc: 0-No pain                 Samin Milke S

## 2017-04-15 NOTE — Progress Notes (Signed)
Cardiology Office Note    Date:  04/16/2017   ID:  Stephanie, Schultz 11-04-1956, MRN [id number]  PCP:  [name], MD  Cardiologist:  Fransico Him, MD   Chief Complaint  Patient presents with  . Hypertension  . Congestive Heart Failure    History of Present Illness:  Stephanie Schultz is a 61 y.o. female with history of DM2, HTN, hyperlipidemia, COPD with chronic respiratory failure on chronic O2, OSA on CPAP, prior tobacco abuse, chronic diastolic CHF and  morbid obesity.  She had a cath in 2009 showing normal coronary arteries and normal LVF.  She had recurrent CP and underwent repeat LHC 12/03/16 with normal coronaries, EF 55-65%, normal LVEDP. Given increased oxygen requirement and more sputum, it was felt that symptoms were more likely due to COPD exacerbation.  She is now back today for followup.  She is doing well.  She denies any chest pain or pressure.  She has chronic SOB which is stable.  She on chronic O2 at 2L. She denies any PND, orthopnea, dizziness, palpitations or syncope.  She says that she feels like her legs have been swelling recently.      Past Medical History:  Diagnosis Date  . Anemia    chronis  . Anxiety   . Arthritis   . Cervical cancer ([place]) 1990   cervical   . Chronic diastolic CHF (congestive heart failure) ([place]) 08/10/2016  . COPD (chronic obstructive pulmonary disease) ([place])    O2 dependent. Pulmo: [name]  . Diabetes mellitus without complication ([place])    a. A1c 8.3 in 11/2015  . Emphysema   . Gallstones    s/p cholecystectomy  . GERD (gastroesophageal reflux disease)   . Gout   . History of home oxygen therapy    "2.5L; 24/7" (08/10/2016)  . Hx of cardiac catheterization    a. LHC at [facility] in [place] [place] 09/2008:  Normal coronary arteries EF 70%. b. LHC 11/2016: normal cors, normal LVEDP, EF 55-65%.  . Hyperlipidemia   . Hypertension   . Morbid obesity ([place])   . OSA on CPAP    CPAP at night   . Pneumonia   . Sickle  cell trait ([place])   . Supplemental oxygen dependent    2L CONTINUOUSLEY  . Tobacco abuse    a. up to 3ppd from age 43 to 75, now 1/4 ppd (01/2013) >> Quit 10/2015    Past Surgical History:  Procedure Laterality Date  . CARDIAC CATHETERIZATION    . CARDIAC CATHETERIZATION N/A 12/03/2016   Procedure: Left Heart Cath and Coronary Angiography;  Surgeon: Peter M Martinique, MD;  Location: [facility];  Service: Cardiovascular;  Laterality: N/A;  . CHOLECYSTECTOMY N/A 12/13/2015   Procedure: LAPAROSCOPIC CHOLECYSTECTOMY;  Surgeon: Ralene Ok, MD;  Location: [facility];  Service: General;  Laterality: N/A;  . COLONOSCOPY  09/05/2012   Procedure: COLONOSCOPY;  Surgeon: Beryle Beams, MD;  Location: [facility];  Service: Endoscopy;  Laterality: N/A;  . COLONOSCOPY WITH PROPOFOL N/A 09/02/2015   Procedure: COLONOSCOPY WITH PROPOFOL;  Surgeon: Carol Ada, MD;  Location: [facility];  Service: Endoscopy;  Laterality: N/A;  . ESOPHAGOGASTRODUODENOSCOPY (EGD) WITH PROPOFOL N/A 03/29/2017   Procedure: ESOPHAGOGASTRODUODENOSCOPY (EGD) WITH PROPOFOL;  Surgeon: Carol Ada, MD;  Location: [facility];  Service: Endoscopy;  Laterality: N/A;  . TUBAL LIGATION      Current Medications: Current Meds  Medication Sig  . albuterol (PROVENTIL) (2.5 MG/3ML) 0.083% nebulizer solution Take  2.5 mg by nebulization 4 (four) times daily as needed for wheezing or shortness of breath.   . allopurinol (ZYLOPRIM) 100 MG tablet Take 100 mg by mouth daily.  Marland Kitchen ALPRAZolam (XANAX) 1 MG tablet Take 1 mg by mouth 2 (two) times daily as needed for anxiety.   Marland Kitchen amLODipine (NORVASC) 2.5 MG tablet Take 1 tablet (2.5 mg total) by mouth daily.  Marland Kitchen aspirin EC 81 MG tablet Take 81 mg by mouth daily.  . budesonide-formoterol (SYMBICORT) 160-4.5 MCG/ACT inhaler Inhale 2 puffs into the lungs 2 (two) times daily.  . clobetasol cream (TEMOVATE) 0.17 % Apply 1 application topically 2 (two) times daily.  . cyclobenzaprine (FLEXERIL)  10 MG tablet Take 10 mg by mouth 2 (two) times daily as needed for muscle spasms.  . fluticasone (FLONASE) 50 MCG/ACT nasal spray Place 1 spray into both nostrils daily.  . furosemide (LASIX) 40 MG tablet Take 1 tablet (40 mg total) by mouth daily.  Marland Kitchen glipiZIDE (GLUCOTROL) 10 MG tablet Take 10 mg by mouth 2 (two) times daily before a meal.   . HYDROcodone-acetaminophen (NORCO) 7.5-325 MG tablet Take 1 tablet by mouth every 6 (six) hours as needed for moderate pain.  Marland Kitchen JANUVIA 100 MG tablet Take 100 mg by mouth daily.  Marland Kitchen LANTUS SOLOSTAR 100 UNIT/ML Solostar Pen Inject 14 Units into the skin at bedtime.  . Linaclotide (LINZESS) 145 MCG CAPS capsule Take 145 mcg by mouth daily.   Marland Kitchen loratadine (CLARITIN) 10 MG tablet Take 10 mg by mouth daily.  . ondansetron (ZOFRAN ODT) 4 MG disintegrating tablet Take 1 tablet (4 mg total) by mouth every 8 (eight) hours as needed for nausea or vomiting.  . OXYGEN Inhale 2.5 L into the lungs continuous.  . pantoprazole (PROTONIX) 40 MG tablet Take 1 tablet (40 mg total) by mouth daily at 12 noon.  . Potassium Chloride ER 20 MEQ TBCR Take 20 mEq by mouth daily.  . pravastatin (PRAVACHOL) 20 MG tablet Take 20 mg by mouth daily.  Marland Kitchen PROAIR HFA 108 (90 Base) MCG/ACT inhaler INHALE 2 PUFFS BY MOUTH EVERY 4 HOURS AS NEEDED FOR WHEEZING  . sodium chloride (OCEAN) 0.65 % SOLN nasal spray Place 1 spray into both nostrils 2 (two) times daily as needed for congestion.   Marland Kitchen spironolactone (ALDACTONE) 25 MG tablet Take 0.5 tablets (12.5 mg total) by mouth daily.  Marland Kitchen tiotropium (SPIRIVA) 18 MCG inhalation capsule Place 1 capsule (18 mcg total) into inhaler and inhale daily.  Marland Kitchen triamterene-hydrochlorothiazide (MAXZIDE-25) 37.5-25 MG tablet Take 1 tablet by mouth daily.  Marland Kitchen zolpidem (AMBIEN) 10 MG tablet Take 10 mg by mouth at bedtime.   Current Facility-Administered Medications for the 04/16/17 encounter (Office Visit) with Sueanne Margarita, MD  Medication  . betamethasone  acetate-betamethasone sodium phosphate (CELESTONE) injection 3 mg    Allergies:   Patient has no known allergies.   Social History   Social History  . Marital status: Divorced    Spouse name: N/A  . Number of children: 3  . Years of education: 11th   Occupational History  . Disabled     Emphysema   Social History Main Topics  . Smoking status: Former Smoker    Packs/day: 0.50    Years: 36.00    Types: Cigarettes    Quit date: 11/11/2015  . Smokeless tobacco: Never Used     Comment: Approx 90 pk-yrs (up to 3ppd until ~ 2009). Smoking 3 cigs per day now.  . Alcohol use No  .  Drug use: No  . Sexual activity: Yes   Other Topics Concern  . None   Social History Narrative   From [place], [place].  Moved to [place] about 8 years ago to be closer to her daughter. She moved back to [place] about a year and a half ago. She lives by herself. She does not routinely exercise or adhere to any particular diet.   Caffeine Use: 1-2 cups daily   Disabled; Previously in home care     Family History:  The patient's family history includes Diabetes in her mother; Lung cancer in her paternal aunt and paternal grandfather; Myasthenia gravis in her mother; Other in her father.   ROS:   Please see the history of present illness.    ROS All other systems reviewed and are negative.  No flowsheet data found.     PHYSICAL EXAM:   VS:  BP (!) 150/66   Pulse 100   Ht 5' (1.524 m)   Wt 217 lb 12.8 oz (98.8 kg)   LMP 06/04/2000 (LMP Unknown)   SpO2 93%   BMI 42.54 kg/m    GEN: Well nourished, well developed, in no acute distress  HEENT: normal  Neck: no JVD, carotid bruits, or masses Cardiac: RRR; no murmurs, rubs, or gallops,no edema.  Intact distal pulses bilaterally.  Respiratory:  clear to auscultation bilaterally, normal work of breathing GI: soft, nontender, nondistended, + BS MS: no deformity or atrophy  Skin: warm and dry, no rash Neuro:  Alert and Oriented x 3, Strength  and sensation are intact Psych: euthymic mood, full affect  Wt Readings from Last 3 Encounters:  04/16/17 217 lb 12.8 oz (98.8 kg)  03/29/17 220 lb (99.8 kg)  02/19/17 223 lb (101.2 kg)      Studies/Labs Reviewed:   EKG:  EKG is not ordered today.   Recent Labs: 12/21/2016: Magnesium 2.0 02/19/2017: B Natriuretic Peptide 21.6 03/01/2017: ALT 70; BUN 15; Creatinine, Ser 1.07; Hemoglobin 12.8; Platelets 230; Potassium 4.0; Sodium 140   Lipid Panel    Component Value Date/Time   CHOL 119 12/01/2016 0205   TRIG 212 (H) 12/01/2016 0205   HDL 34 (L) 12/01/2016 0205   CHOLHDL 3.5 12/01/2016 0205   VLDL 42 (H) 12/01/2016 0205   LDLCALC 43 12/01/2016 0205    Additional studies/ records that were reviewed today include:  none    ASSESSMENT:    1. Chronic diastolic CHF (congestive heart failure) ([place])   2. Essential hypertension   3. Chest pain, unspecified type      PLAN:  In order of problems listed above:  1. Chronic diastolic CHF - she appear euvolemic on exam today.  Her weight is stable and she has actually lost 6lbs. She will continue on lasix 40mg  daily.   2. HTN - Her BP is adequately controlled on exam. She will continue on  aldactone 25mg  daily.  Since she is taking lasix daily I will stop her Maxide and increase amlodipine to 5mg  daily.  She will follow up with extender in 2 weeks.  Check BMET in 1 week.   3. Chest pain - noncardiac with normal coronary arteries 11/2016.      Medication Adjustments/Labs and Tests Ordered: Current medicines are reviewed at length with the patient today.  Concerns regarding medicines are outlined above.  Medication changes, Labs and Tests ordered today are listed in the Patient Instructions below.  There are no Patient Instructions on file for this visit.   Signed, Tressia Miners  Radford Pax, MD  04/16/2017 8:16 AM    [facility] [address], [place], [place]  [address] Phone: [phone]; Fax: [phone]

## 2017-04-16 ENCOUNTER — Encounter: Payer: Self-pay | Admitting: Cardiology

## 2017-04-16 ENCOUNTER — Ambulatory Visit (INDEPENDENT_AMBULATORY_CARE_PROVIDER_SITE_OTHER): Payer: Medicaid Other | Admitting: Cardiology

## 2017-04-16 ENCOUNTER — Encounter (INDEPENDENT_AMBULATORY_CARE_PROVIDER_SITE_OTHER): Payer: Self-pay

## 2017-04-16 VITALS — BP 150/66 | HR 100 | Ht 60.0 in | Wt 217.8 lb

## 2017-04-16 DIAGNOSIS — R079 Chest pain, unspecified: Secondary | ICD-10-CM | POA: Diagnosis not present

## 2017-04-16 DIAGNOSIS — I5032 Chronic diastolic (congestive) heart failure: Secondary | ICD-10-CM | POA: Diagnosis not present

## 2017-04-16 DIAGNOSIS — I10 Essential (primary) hypertension: Secondary | ICD-10-CM

## 2017-04-16 MED ORDER — AMLODIPINE BESYLATE 5 MG PO TABS: 5.0000 mg | ORAL_TABLET | Freq: Every day | ORAL | 11 refills | Status: DC

## 2017-04-16 NOTE — Patient Instructions (Signed)
Medication Instructions:  1) STOP MAXIDE 2) INCREASE AMLODIPINE to 5 mg daily  Labwork: Your physician recommends that you return for lab work in: [place] (BMET).  Testing/Procedures: None  Follow-Up: Your physician recommends that you schedule a follow-up appointment in 2 weeks with Dr. Theodosia Blender assistant.  Your physician wants you to follow-up in: 6 months with Dr. Radford Pax. You will receive a reminder letter in the mail two months in advance. If you don't receive a letter, please call our office to schedule the follow-up appointment.   Any Other Special Instructions Will Be Listed Below (If Applicable).     If you need a refill on your cardiac medications before your next appointment, please call your pharmacy.

## 2017-04-23 ENCOUNTER — Other Ambulatory Visit: Payer: Medicaid Other | Admitting: *Deleted

## 2017-04-23 DIAGNOSIS — I5032 Chronic diastolic (congestive) heart failure: Secondary | ICD-10-CM

## 2017-04-23 DIAGNOSIS — I10 Essential (primary) hypertension: Secondary | ICD-10-CM

## 2017-04-23 LAB — BASIC METABOLIC PANEL
BUN / CREAT RATIO: 15 (ref 9–23)
BUN: 17 mg/dL (ref 6–24)
CHLORIDE: 97 mmol/L (ref 96–106)
CO2: 25 mmol/L (ref 20–29)
Calcium: 9.9 mg/dL (ref 8.7–10.2)
Creatinine, Ser: 1.17 mg/dL — ABNORMAL HIGH (ref 0.57–1.00)
GFR calc non Af Amer: 51 mL/min/{1.73_m2} — ABNORMAL LOW (ref >59)
GFR, EST AFRICAN AMERICAN: 59 mL/min/{1.73_m2} — AB (ref >59)
Glucose: 110 mg/dL — ABNORMAL HIGH (ref 65–99)
Potassium: 4.2 mmol/L (ref 3.5–5.2)
Sodium: 139 mmol/L (ref 134–144)

## 2017-04-25 ENCOUNTER — Emergency Department (HOSPITAL_COMMUNITY): Payer: Medicaid Other

## 2017-04-25 ENCOUNTER — Emergency Department (HOSPITAL_COMMUNITY)
Admission: EM | Admit: 2017-04-25 | Discharge: 2017-04-25 | Disposition: A | Discharge status: Home / Self Care | Payer: Medicaid Other | Attending: Emergency Medicine | Admitting: Emergency Medicine

## 2017-04-25 ENCOUNTER — Encounter (HOSPITAL_COMMUNITY): Payer: Self-pay

## 2017-04-25 DIAGNOSIS — M5412 Radiculopathy, cervical region: Secondary | ICD-10-CM | POA: Insufficient documentation

## 2017-04-25 DIAGNOSIS — I5032 Chronic diastolic (congestive) heart failure: Secondary | ICD-10-CM | POA: Insufficient documentation

## 2017-04-25 DIAGNOSIS — Z7984 Long term (current) use of oral hypoglycemic drugs: Secondary | ICD-10-CM | POA: Insufficient documentation

## 2017-04-25 DIAGNOSIS — E119 Type 2 diabetes mellitus without complications: Secondary | ICD-10-CM | POA: Diagnosis not present

## 2017-04-25 DIAGNOSIS — Z8541 Personal history of malignant neoplasm of cervix uteri: Secondary | ICD-10-CM | POA: Diagnosis not present

## 2017-04-25 DIAGNOSIS — Z79899 Other long term (current) drug therapy: Secondary | ICD-10-CM | POA: Diagnosis not present

## 2017-04-25 DIAGNOSIS — M542 Cervicalgia: Secondary | ICD-10-CM | POA: Diagnosis present

## 2017-04-25 DIAGNOSIS — J449 Chronic obstructive pulmonary disease, unspecified: Secondary | ICD-10-CM | POA: Diagnosis not present

## 2017-04-25 DIAGNOSIS — I110 Hypertensive heart disease with heart failure: Secondary | ICD-10-CM | POA: Diagnosis not present

## 2017-04-25 DIAGNOSIS — Z87891 Personal history of nicotine dependence: Secondary | ICD-10-CM | POA: Insufficient documentation

## 2017-04-25 DIAGNOSIS — Z7982 Long term (current) use of aspirin: Secondary | ICD-10-CM | POA: Diagnosis not present

## 2017-04-25 LAB — CBC
HCT: 38 % (ref 36.0–46.0)
HEMOGLOBIN: 13.1 g/dL (ref 12.0–15.0)
MCH: 28 pg (ref 26.0–34.0)
MCHC: 34.5 g/dL (ref 30.0–36.0)
MCV: 81.2 fL (ref 78.0–100.0)
PLATELETS: 253 10*3/uL (ref 150–400)
RBC: 4.68 MIL/uL (ref 3.87–5.11)
RDW: 14.4 % (ref 11.5–15.5)
WBC: 5.3 10*3/uL (ref 4.0–10.5)

## 2017-04-25 LAB — BASIC METABOLIC PANEL
ANION GAP: 10 (ref 5–15)
BUN: 16 mg/dL (ref 6–20)
CALCIUM: 9.8 mg/dL (ref 8.9–10.3)
CO2: 27 mmol/L (ref 22–32)
CREATININE: 1.19 mg/dL — AB (ref 0.44–1.00)
Chloride: 102 mmol/L (ref 101–111)
GFR calc Af Amer: 57 mL/min — ABNORMAL LOW (ref >60)
GFR calc non Af Amer: 49 mL/min — ABNORMAL LOW (ref >60)
Glucose, Bld: 94 mg/dL (ref 65–99)
Potassium: 3.6 mmol/L (ref 3.5–5.1)
SODIUM: 139 mmol/L (ref 135–145)

## 2017-04-25 LAB — I-STAT TROPONIN, ED
TROPONIN I, POC: 0.01 ng/mL (ref 0.00–0.08)
Troponin i, poc: 0 ng/mL (ref 0.00–0.08)

## 2017-04-25 MED ORDER — ONDANSETRON HCL 4 MG PO TABS
4.0000 mg | ORAL_TABLET | Freq: Once | ORAL | Status: AC
  Administered 2017-04-25: 4 mg via ORAL
  Filled 2017-04-25: qty 1

## 2017-04-25 MED ORDER — HYDROCODONE-ACETAMINOPHEN 5-325 MG PO TABS
2.0000 | ORAL_TABLET | Freq: Once | ORAL | Status: AC
  Administered 2017-04-25: 2 via ORAL
  Filled 2017-04-25: qty 2

## 2017-04-25 NOTE — ED Triage Notes (Signed)
Per EMS, pt from home, around 0800 this morning was at the pharmacy pt began having left shoulder pain radiating down the left arm and has had intermittent pain in same area since. Denies CP. 12 lead showed NSR. VS 130/62, Pulse 94, 18 RR, 98% on 2.5L(pt always on O2).

## 2017-04-25 NOTE — ED Notes (Signed)
Pt called for x 1

## 2017-04-25 NOTE — Care Management (Signed)
ED CM noted consult for medication assistance. Patient noted to have [place] Medicaid Access, patient is not eligible for any medication assistance under Medicaid Guidelines. CM updated [employer] on [employer].

## 2017-04-25 NOTE — ED Provider Notes (Signed)
[facility] DEPT Provider Note   CSN: [id number] Arrival date & time: 04/25/17  1558     History   Chief Complaint Chief Complaint  Patient presents with  . Shoulder Pain    HPI Stephanie Schultz is a 61 y.o. female.  HPI   61 yo F with PMhx as below including COPD, chronic non-cardiac CP (clean cath 11/2016) here with left arm pain. Pt was sweeping her house earlier today after it was sprayed for ants. She then went to the pharmacy and noticed acute onset of sharp, tingling, burning, stabbing pain in her left lateral neck that "shot down" her left arm, involving the back of her arm and ulnar fingers. She had associated sharp pain and spasm in her neck that slowly worked out over 15 minutes as she moved her arm. She has h/o muscle spasms but has never had pain like this. She does endorse chronic neck pain that worsens with coughing. No fever or chills. No recent illness. No recent falls.  Past Medical History:  Diagnosis Date  . Anemia    chronis  . Anxiety   . Arthritis   . Cervical cancer ([place]) 1990   cervical   . Chronic diastolic CHF (congestive heart failure) ([place]) 08/10/2016  . COPD (chronic obstructive pulmonary disease) ([place])    O2 dependent. Pulmo: [name]  . Diabetes mellitus without complication ([place])    a. A1c 8.3 in 11/2015  . Emphysema   . Gallstones    s/p cholecystectomy  . GERD (gastroesophageal reflux disease)   . Gout   . History of home oxygen therapy    "2.5L; 24/7" (08/10/2016)  . Hx of cardiac catheterization    a. LHC at [facility] in [place], [place] 09/2008:  Normal coronary arteries EF 70%. b. LHC 11/2016: normal cors, normal LVEDP, EF 55-65%.  . Hyperlipidemia   . Hypertension   . Morbid obesity ([place])   . OSA on CPAP    CPAP at night   . Pneumonia   . Sickle cell trait ([place])   . Supplemental oxygen dependent    2L CONTINUOUSLEY  . Tobacco abuse    a. up to 3ppd from age 24 to 15, now 1/4 ppd (01/2013) >> Quit 10/2015    Patient Active  Problem List   Diagnosis Date Noted  . Chronic diastolic CHF (congestive heart failure) ([place]) 08/10/2016  . Acute bronchitis 07/22/2016  . Acute respiratory failure ([place]) 07/20/2016  . COPD exacerbation ([place]) 01/23/2016  . S/P laparoscopic cholecystectomy 12/13/2015  . Chronic respiratory failure ([place]) 03/31/2015  . Rib fracture 03/31/2015  . Acute respiratory failure with hypoxia ([place]) 02/17/2015  . Arthritis 12/13/2014  . Gout 03/03/2014  . Diabetic neuropathy ([place]) 03/03/2014  . Carpal tunnel syndrome 03/03/2014  . DM type 2 (diabetes mellitus, type 2) ([place]) 05/22/2013  . Anxiety 05/22/2013  . Transaminasemia 05/09/2013  . Leukocytosis 04/27/2013  . Tobacco abuse   . Chest pain   . Sickle cell trait ([place])   . Morbid obesity ([place])   . Acute on chronic respiratory failure ([place]) 01/16/2013  . Diastolic dysfunction 42/70/6237  . Hoarseness 12/22/2012  . Epistaxis, recurrent 07/08/2012  . GERD (gastroesophageal reflux disease) 09/20/2011  . Pulmonary nodule 09/20/2011  . COPD (chronic obstructive pulmonary disease) ([place]) 04/04/2011  . Diabetes mellitus type 2, controlled ([place]) 04/04/2011  . Hypertension 04/04/2011  . Obstructive sleep apnea 04/04/2011  . Chronic allergic rhinitis 04/04/2011    Past Surgical History:  Procedure Laterality Date  . CARDIAC  CATHETERIZATION    . CARDIAC CATHETERIZATION N/A 12/03/2016   Procedure: Left Heart Cath and Coronary Angiography;  Surgeon: Peter M Martinique, MD;  Location: [facility];  Service: Cardiovascular;  Laterality: N/A;  . CHOLECYSTECTOMY N/A 12/13/2015   Procedure: LAPAROSCOPIC CHOLECYSTECTOMY;  Surgeon: Ralene Ok, MD;  Location: [facility];  Service: General;  Laterality: N/A;  . COLONOSCOPY  09/05/2012   Procedure: COLONOSCOPY;  Surgeon: Beryle Beams, MD;  Location: [facility];  Service: Endoscopy;  Laterality: N/A;  . COLONOSCOPY WITH PROPOFOL N/A 09/02/2015   Procedure: COLONOSCOPY WITH PROPOFOL;  Surgeon: Carol Ada, MD;  Location: [facility];  Service: Endoscopy;  Laterality: N/A;  . ESOPHAGOGASTRODUODENOSCOPY (EGD) WITH PROPOFOL N/A 03/29/2017   Procedure: ESOPHAGOGASTRODUODENOSCOPY (EGD) WITH PROPOFOL;  Surgeon: Carol Ada, MD;  Location: [facility];  Service: Endoscopy;  Laterality: N/A;  . TUBAL LIGATION      OB History    No data available       Home Medications    Prior to Admission medications   Medication Sig Start Date End Date Taking? Authorizing Provider  albuterol (PROVENTIL) (2.5 MG/3ML) 0.083% nebulizer solution Take 2.5 mg by nebulization 4 (four) times daily as needed for wheezing or shortness of breath.    Yes [provider]  allopurinol (ZYLOPRIM) 100 MG tablet Take 100 mg by mouth daily.   Yes [provider]  ALPRAZolam Duanne Moron) 1 MG tablet Take 1 mg by mouth 2 (two) times daily as needed for anxiety.  04/13/16  Yes [provider]  amLODipine (NORVASC) 10 MG tablet Take 10 mg by mouth daily.   Yes [provider]  aspirin EC 81 MG tablet Take 81 mg by mouth daily.   Yes [provider]  budesonide-formoterol (SYMBICORT) 160-4.5 MCG/ACT inhaler Inhale 2 puffs into the lungs 2 (two) times daily. 05/03/15  Yes Collene Gobble, MD  clobetasol cream (TEMOVATE) 1.32 % Apply 1 application topically as needed (rash on the bottom).  03/25/17  Yes [provider]  cyclobenzaprine (FLEXERIL) 10 MG tablet Take 10 mg by mouth 2 (two) times daily as needed for muscle spasms.   Yes [provider]  fluticasone (FLONASE) 50 MCG/ACT nasal spray Place 1 spray into both nostrils daily. 03/24/16  Yes Regalado, Belkys A, MD  furosemide (LASIX) 40 MG tablet Take 1 tablet (40 mg total) by mouth daily. 12/05/16  Yes Arbutus Leas, NP  glipiZIDE (GLUCOTROL) 10 MG tablet Take 10 mg by mouth 2 (two) times daily before a meal.    Yes [provider]  HYDROcodone-acetaminophen (NORCO) 7.5-325 MG tablet Take 1 tablet by mouth every 6  (six) hours as needed for moderate pain.   Yes [provider]  JANUVIA 100 MG tablet Take 100 mg by mouth daily. 05/24/16  Yes [provider]  LANTUS SOLOSTAR 100 UNIT/ML Solostar Pen Inject 18 Units into the skin at bedtime.  12/23/16  Yes [provider]  Linaclotide (LINZESS) 145 MCG CAPS capsule Take 145 mcg by mouth daily.    Yes [provider]  loratadine (CLARITIN) 10 MG tablet Take 10 mg by mouth daily.   Yes [provider]  ondansetron (ZOFRAN ODT) 4 MG disintegrating tablet Take 1 tablet (4 mg total) by mouth every 8 (eight) hours as needed for nausea or vomiting. 03/01/17  Yes Blanchie Dessert, MD  OXYGEN Inhale 2 L into the lungs continuous.    Yes [provider]  pantoprazole (PROTONIX) 40 MG tablet Take 1  tablet (40 mg total) by mouth daily at 12 noon. 07/22/16  Yes Debbe Odea, MD  Potassium Chloride ER 20 MEQ TBCR Take 20 mEq by mouth daily. 12/04/16  Yes Arbutus Leas, NP  pravastatin (PRAVACHOL) 20 MG tablet Take 20 mg by mouth as needed.    Yes [provider]  PROAIR HFA 108 (90 Base) MCG/ACT inhaler INHALE 2 PUFFS BY MOUTH EVERY 4 HOURS AS NEEDED FOR WHEEZING 06/14/16  Yes Byrum, Rose Fillers, MD  sodium chloride (OCEAN) 0.65 % SOLN nasal spray Place 1 spray into both nostrils 2 (two) times daily as needed for congestion.    Yes [provider]  spironolactone (ALDACTONE) 25 MG tablet Take 0.5 tablets (12.5 mg total) by mouth daily. 12/05/16  Yes Arbutus Leas, NP  tiotropium (SPIRIVA) 18 MCG inhalation capsule Place 1 capsule (18 mcg total) into inhaler and inhale daily. 05/03/15  Yes Collene Gobble, MD  zolpidem (AMBIEN) 10 MG tablet Take 10 mg by mouth at bedtime.   Yes [provider]  amLODipine (NORVASC) 5 MG tablet Take 1 tablet (5 mg total) by mouth daily. Patient not taking: Reported on 04/25/2017 04/16/17   Sueanne Margarita, MD    Family History Family History  Problem Relation Age of Onset    . Other Father        unaware of father's medical history  . Diabetes Mother        alive @ 46  . Myasthenia gravis Mother   . Lung cancer Paternal Aunt   . Lung cancer Paternal Grandfather   . Other Unknown        multiple siblings a&w.  . Heart attack Neg Hx   . Heart failure Neg Hx     Social History Social History  Substance Use Topics  . Smoking status: Former Smoker    Packs/day: 0.50    Years: 36.00    Types: Cigarettes    Quit date: 11/11/2015  . Smokeless tobacco: Never Used     Comment: Approx 90 pk-yrs (up to 3ppd until ~ 2009). Smoking 3 cigs per day now.  . Alcohol use No     Allergies   Patient has no known allergies.   Review of Systems Review of Systems  Constitutional: Negative for fatigue and fever.  Musculoskeletal: Positive for arthralgias and myalgias.  Neurological: Positive for numbness.  All other systems reviewed and are negative.    Physical Exam Updated Vital Signs BP 129/86   Pulse 70   Temp 98.1 F (36.7 C) (Oral)   Resp 16   Ht 5' (1.524 m)   Wt 98.4 kg (217 lb)   LMP 06/04/2000 (LMP Unknown)   SpO2 95%   BMI 42.38 kg/m   Physical Exam  Constitutional: She is oriented to person, place, and time. She appears well-developed and well-nourished. No distress.  HENT:  Head: Normocephalic and atraumatic.  Eyes: Conjunctivae are normal.  Neck: Neck supple.  MModerate left paraspinal TTP over lower cervical spine. Positive Spurling's test on left with reproduction of pain along posterior and ulnar left arm. Strength 5/5 in UE and LE. Normal sensation to light touch. 2+ brachioradialis reflexes.  Cardiovascular: Normal rate, regular rhythm and normal heart sounds.  Exam reveals no friction rub.   No murmur heard. Pulmonary/Chest: Effort normal and breath sounds normal. No respiratory distress. She has no wheezes. She has no rales.  Abdominal: Soft. She exhibits no distension.  Musculoskeletal: She exhibits no edema.   Neurological:  She is alert and oriented to person, place, and time. She exhibits normal muscle tone.  Skin: Skin is warm. Capillary refill takes less than 2 seconds.  Psychiatric: She has a normal mood and affect.  Nursing note and vitals reviewed.    ED Treatments / Results  Labs (all labs ordered are listed, but only abnormal results are displayed) Labs Reviewed  BASIC METABOLIC PANEL - Abnormal; Notable for the following:       Result Value   Creatinine, Ser 1.19 (*)    GFR calc non Af Amer 49 (*)    GFR calc Af Amer 57 (*)    All other components within normal limits  CBC  I-STAT TROPOININ, ED  I-STAT TROPOININ, ED    EKG  EKG Interpretation  Date/Time:  Thursday April 25 2017 16:15:39 EDT Ventricular Rate:  81 PR Interval:  150 QRS Duration: 80 QT Interval:  402 QTC Calculation: 466 R Axis:   71 Text Interpretation:  Normal sinus rhythm Normal ECG No significant change since last tracing Confirmed by Duffy Bruce [phone]) on 04/25/2017 5:16:32 PM       Radiology Dg Chest 2 View  Result Date: 04/25/2017 CLINICAL DATA:  Left shoulder and arm pain. EXAM: CHEST  2 VIEW COMPARISON:  02/19/2017. FINDINGS: Normal sized heart. Decreased linear scarring at the right lung base. The remainder of the lungs are clear with normal vascularity. Old, healed left rib fractures. Thoracic spine degenerative changes. IMPRESSION: No acute abnormality. Electronically Signed   By: Claudie Revering M.D.   On: 04/25/2017 19:53   Ct Cervical Spine Wo Contrast  Result Date: 04/25/2017 CLINICAL DATA:  Neck pain without a history of injury. EXAM: CT CERVICAL SPINE WITHOUT CONTRAST TECHNIQUE: Multidetector CT imaging of the cervical spine was performed without intravenous contrast. Multiplanar CT image reconstructions were also generated. COMPARISON:  None. FINDINGS: Note: Fine detail of the mid lower cervical spine is degraded by body habitus. Alignment: Loss of normal cervical lordosis. Skull base  and vertebrae: No acute fracture. No primary bone lesion or focal pathologic process. Flowing bridging anterior osteophyte extends from C4 down the T2 compatible with DISH. Soft tissues and spinal canal: No prevertebral fluid or swelling. No visible canal hematoma. Disc levels: Loss of disc height noted at C5-6, see 6 7, and C7-T1. Bilateral upper facet osteoarthritis is evident and the right C2-3 facets are fused. Upper chest: Subcutaneous nodule posterior upper left back likely a sebaceous cyst. Emphysema noted upper lobes. Other: None. IMPRESSION: 1. No acute fracture identified although image quality in the mid and lower spine is degraded by large body habitus. 2. Large anterior flowing osteophyte extends from C4-T2 consistent with DISH. 3. Loss of cervical lordosis. This can be related to patient positioning, muscle spasm or soft tissue injury. 4. Cortical endplates in the mid and lower cervical spine are not well demonstrated on this study, likely related to patient body habitus. If there is clinical concern for discitis osteomyelitis, MRI without and with contrast would be required to further evaluate. Electronically Signed   By: Misty Stanley M.D.   On: 04/25/2017 19:28    Procedures Procedures (including critical care time)  Medications Ordered in ED Medications  HYDROcodone-acetaminophen (NORCO/VICODIN) 5-325 MG per tablet 2 tablet (2 tablets Oral Given 04/25/17 1809)  ondansetron (ZOFRAN) tablet 4 mg (4 mg Oral Given 04/25/17 2006)     Initial Impression / Assessment and Plan / ED Course  I have reviewed the triage vital signs and the nursing notes.  Pertinent labs & imaging results that were available during my care of the patient were reviewed by me and considered in my medical decision making (see chart for details).     61 yo F with extensive PMHx as above including chronic COPD, morbid obesity, right heart failure, and chronic arthritis here with left arm pain. Pain is sharp,  stabbing, and reproduced with Spurling test on left - suspect acute cervical radiculopathy. No bony lesions/acute fx on CT scan. No signs of weakness or numbness that persists. Pain is all localized to L arm and I do not suspect referred pain from chest or lungs. Delta trop neg, EKG non-ischemic, doubt ACS. No worsening hypoxia, tachypnea, or s/s PE. Will d/c with outpt follow-up, analgesia.  Final Clinical Impressions(s) / ED Diagnoses   Final diagnoses:  Neck pain  Cervical radiculopathy    New Prescriptions Discharge Medication List as of 04/25/2017  9:40 PM       Duffy Bruce, MD 04/26/17 1455

## 2017-04-25 NOTE — ED Notes (Signed)
Pt verbalized understanding discharge instructions and denies any further needs or questions at this time. VS stable, ambulatory and steady gait.   

## 2017-04-30 ENCOUNTER — Ambulatory Visit (INDEPENDENT_AMBULATORY_CARE_PROVIDER_SITE_OTHER): Payer: Medicaid Other | Admitting: Cardiology

## 2017-04-30 ENCOUNTER — Encounter: Payer: Self-pay | Admitting: Cardiology

## 2017-04-30 VITALS — BP 136/66 | HR 86 | Ht 60.0 in | Wt 217.0 lb

## 2017-04-30 DIAGNOSIS — I5032 Chronic diastolic (congestive) heart failure: Secondary | ICD-10-CM

## 2017-04-30 DIAGNOSIS — Z5181 Encounter for therapeutic drug level monitoring: Secondary | ICD-10-CM

## 2017-04-30 DIAGNOSIS — I10 Essential (primary) hypertension: Secondary | ICD-10-CM

## 2017-04-30 LAB — BASIC METABOLIC PANEL
BUN/Creatinine Ratio: 19 (ref 9–23)
BUN: 25 mg/dL — ABNORMAL HIGH (ref 6–24)
CALCIUM: 9.8 mg/dL (ref 8.7–10.2)
CHLORIDE: 96 mmol/L (ref 96–106)
CO2: 22 mmol/L (ref 20–29)
Creatinine, Ser: 1.35 mg/dL — ABNORMAL HIGH (ref 0.57–1.00)
GFR calc non Af Amer: 43 mL/min/{1.73_m2} — ABNORMAL LOW (ref >59)
GFR, EST AFRICAN AMERICAN: 50 mL/min/{1.73_m2} — AB (ref >59)
GLUCOSE: 148 mg/dL — AB (ref 65–99)
POTASSIUM: 4 mmol/L (ref 3.5–5.2)
Sodium: 138 mmol/L (ref 134–144)

## 2017-04-30 NOTE — Patient Instructions (Signed)
Medication Instructions:  None  Labwork: BMET today  Testing/Procedures: None  Follow-Up: Keep follow up as planned in December with Dr. Radford Pax.   Any Other Special Instructions Will Be Listed Below (If Applicable).     If you need a refill on your cardiac medications before your next appointment, please call your pharmacy.

## 2017-04-30 NOTE — Progress Notes (Signed)
bmet  

## 2017-04-30 NOTE — Progress Notes (Signed)
04/30/2017 Stephanie Schultz   1956-07-20  [id number]  Primary Physician [name], MD Primary Cardiologist: Dr. Radford Pax    Reason for Visit/CC: F/u for HTN; medication management   HPI:  Stephanie Schultz is a 61 y.o. female, followed by Dr. Radford Pax, who presents to clinic for 2 week f/u after recent medication adjustments for HTN.  Her PMH is significant for DM2, HTN, hyperlipidemia, COPD with chronic respiratory failure on chronic O2, OSA on CPAP, prior tobacco abuse, chronic diastolic CHF and  morbid obesity.  She had a cath in 2009 showing normal coronary arteries and normal LVF.  She had recurrent CP and underwent repeat LHC 12/03/16 with normal coronaries, EF 55-65%, normal LVEDP. Based on symptoms, increased O2 requirements and increased sputum production, she was felt to have an acute COPD exacerbation and was treated with improvement in symptoms.   She was recently seen by Dr. Radford Pax on 04/16/17 for routine f/u. She was noted to be stable from a respiratory standpoint. No increased O2 requirements. She was noted to be euvolemic and was actually down 6 lb from previous office weight. BP was adequately controlled. Given she was taking lasix daily, Dr. Radford Pax stopped her Maxide and increased her amlodipine to 5 mg daily (pt reports she has actually been taking 10 mg daily). Aldactone was continued. Shew was ordered to return in 2 week for f/u with an APP + repeat BMP.   She has done well from a symptoms standpoint. No CP or increased dyspnea. O2 requirements stable. No significant weight gain. She has been compliant with medications and tolerating increase dose of amlodipine well w/o side effects.     Current Meds  Medication Sig  . albuterol (PROVENTIL) (2.5 MG/3ML) 0.083% nebulizer solution Take 2.5 mg by nebulization 4 (four) times daily as needed for wheezing or shortness of breath.   . allopurinol (ZYLOPRIM) 100 MG tablet Take 100 mg by mouth daily.  Marland Kitchen ALPRAZolam  (XANAX) 1 MG tablet Take 1 mg by mouth 2 (two) times daily as needed for anxiety.   Marland Kitchen amLODipine (NORVASC) 10 MG tablet Take 10 mg by mouth daily.  Marland Kitchen aspirin EC 81 MG tablet Take 81 mg by mouth daily.  . budesonide-formoterol (SYMBICORT) 160-4.5 MCG/ACT inhaler Inhale 2 puffs into the lungs 2 (two) times daily.  . clobetasol cream (TEMOVATE) 0.27 % Apply 1 application topically as needed (rash on the bottom).   . cyclobenzaprine (FLEXERIL) 10 MG tablet Take 10 mg by mouth 2 (two) times daily as needed for muscle spasms.  . fluticasone (FLONASE) 50 MCG/ACT nasal spray Place 1 spray into both nostrils daily.  . furosemide (LASIX) 40 MG tablet Take 1 tablet (40 mg total) by mouth daily.  Marland Kitchen glipiZIDE (GLUCOTROL) 10 MG tablet Take 10 mg by mouth 2 (two) times daily before a meal.   . HYDROcodone-acetaminophen (NORCO) 7.5-325 MG tablet Take 1 tablet by mouth every 6 (six) hours as needed for moderate pain.  Marland Kitchen JANUVIA 100 MG tablet Take 100 mg by mouth daily.  Marland Kitchen LANTUS SOLOSTAR 100 UNIT/ML Solostar Pen Inject 18 Units into the skin at bedtime.   . Linaclotide (LINZESS) 145 MCG CAPS capsule Take 145 mcg by mouth daily.   Marland Kitchen loratadine (CLARITIN) 10 MG tablet Take 10 mg by mouth daily.  . ondansetron (ZOFRAN ODT) 4 MG disintegrating tablet Take 1 tablet (4 mg total) by mouth every 8 (eight) hours as needed for nausea or vomiting.  . OXYGEN Inhale 2 L into the  lungs continuous.   . pantoprazole (PROTONIX) 40 MG tablet Take 1 tablet (40 mg total) by mouth daily at 12 noon.  . Potassium Chloride ER 20 MEQ TBCR Take 20 mEq by mouth daily.  . pravastatin (PRAVACHOL) 20 MG tablet Take 20 mg by mouth as needed.   Marland Kitchen PROAIR HFA 108 (90 Base) MCG/ACT inhaler INHALE 2 PUFFS BY MOUTH EVERY 4 HOURS AS NEEDED FOR WHEEZING  . sodium chloride (OCEAN) 0.65 % SOLN nasal spray Place 1 spray into both nostrils 2 (two) times daily as needed for congestion.   Marland Kitchen spironolactone (ALDACTONE) 25 MG tablet Take 0.5 tablets (12.5 mg  total) by mouth daily.  Marland Kitchen tiotropium (SPIRIVA) 18 MCG inhalation capsule Place 1 capsule (18 mcg total) into inhaler and inhale daily.  . Vitamin D, Ergocalciferol, (DRISDOL) 50000 units CAPS capsule take 1 capsule by mouth every week for 6 WEEKS  . zolpidem (AMBIEN) 10 MG tablet Take 10 mg by mouth at bedtime.   Current Facility-Administered Medications for the 04/30/17 encounter (Office Visit) with Consuelo Pandy, PA-C  Medication  . betamethasone acetate-betamethasone sodium phosphate (CELESTONE) injection 3 mg   No Known Allergies Past Medical History:  Diagnosis Date  . Anemia    chronis  . Anxiety   . Arthritis   . Cervical cancer ([place]) 1990   cervical   . Chronic diastolic CHF (congestive heart failure) ([place]) 08/10/2016  . COPD (chronic obstructive pulmonary disease) ([place])    O2 dependent. Pulmo: [name]  . Diabetes mellitus without complication ([place])    a. A1c 8.3 in 11/2015  . Emphysema   . Gallstones    s/p cholecystectomy  . GERD (gastroesophageal reflux disease)   . Gout   . History of home oxygen therapy    "2.5L; 24/7" (08/10/2016)  . Hx of cardiac catheterization    a. LHC at [facility] in [place] [place] 09/2008:  Normal coronary arteries EF 70%. b. LHC 11/2016: normal cors, normal LVEDP, EF 55-65%.  . Hyperlipidemia   . Hypertension   . Morbid obesity ([place])   . OSA on CPAP    CPAP at night   . Pneumonia   . Sickle cell trait ([place])   . Supplemental oxygen dependent    2L CONTINUOUSLEY  . Tobacco abuse    a. up to 3ppd from age 85 to 32, now 1/4 ppd (01/2013) >> Quit 10/2015   Family History  Problem Relation Age of Onset  . Other Father        unaware of father's medical history  . Diabetes Mother        alive @ 22  . Myasthenia gravis Mother   . Lung cancer Paternal Aunt   . Lung cancer Paternal Grandfather   . Other Unknown        multiple siblings a&w.  . Heart attack Neg Hx   . Heart failure Neg Hx    Past Surgical History:  Procedure Laterality  Date  . CARDIAC CATHETERIZATION    . CARDIAC CATHETERIZATION N/A 12/03/2016   Procedure: Left Heart Cath and Coronary Angiography;  Surgeon: Peter M Martinique, MD;  Location: [facility];  Service: Cardiovascular;  Laterality: N/A;  . CHOLECYSTECTOMY N/A 12/13/2015   Procedure: LAPAROSCOPIC CHOLECYSTECTOMY;  Surgeon: Ralene Ok, MD;  Location: [facility];  Service: General;  Laterality: N/A;  . COLONOSCOPY  09/05/2012   Procedure: COLONOSCOPY;  Surgeon: Beryle Beams, MD;  Location: [facility];  Service: Endoscopy;  Laterality: N/A;  . COLONOSCOPY  WITH PROPOFOL N/A 09/02/2015   Procedure: COLONOSCOPY WITH PROPOFOL;  Surgeon: Carol Ada, MD;  Location: [facility];  Service: Endoscopy;  Laterality: N/A;  . ESOPHAGOGASTRODUODENOSCOPY (EGD) WITH PROPOFOL N/A 03/29/2017   Procedure: ESOPHAGOGASTRODUODENOSCOPY (EGD) WITH PROPOFOL;  Surgeon: Carol Ada, MD;  Location: [facility];  Service: Endoscopy;  Laterality: N/A;  . TUBAL LIGATION     Social History   Social History  . Marital status: Divorced    Spouse name: N/A  . Number of children: 3  . Years of education: 11th   Occupational History  . Disabled     Emphysema   Social History Main Topics  . Smoking status: Former Smoker    Packs/day: 0.50    Years: 36.00    Types: Cigarettes    Quit date: 11/11/2015  . Smokeless tobacco: Never Used     Comment: Approx 90 pk-yrs (up to 3ppd until ~ 2009). Smoking 3 cigs per day now.  . Alcohol use No  . Drug use: No  . Sexual activity: Yes   Other Topics Concern  . Not on file   Social History Narrative   From [place], [place].  Moved to [place] about 8 years ago to be closer to her daughter. She moved back to [place] about a year and a half ago. She lives by herself. She does not routinely exercise or adhere to any particular diet.   Caffeine Use: 1-2 cups daily   Disabled; Previously in home care     Review of Systems: General: negative for chills, fever, night  sweats or weight changes.  Cardiovascular: negative for chest pain, dyspnea on exertion, edema, orthopnea, palpitations, paroxysmal nocturnal dyspnea or shortness of breath Dermatological: negative for rash Respiratory: negative for cough or wheezing Urologic: negative for hematuria Abdominal: negative for nausea, vomiting, diarrhea, bright red blood per rectum, melena, or hematemesis Neurologic: negative for visual changes, syncope, or dizziness All other systems reviewed and are otherwise negative except as noted above.   Physical Exam:  Height 5' (1.524 m), weight 217 lb (98.4 kg), last menstrual period 06/04/2000.  General appearance: alert, cooperative, no distress and morbidly obese Neck: no carotid bruit and no JVD Lungs: clear to auscultation bilaterally Heart: regular rate and rhythm, S1, S2 normal, no murmur, click, rub or gallop Extremities: extremities normal, atraumatic, no cyanosis or edema Pulses: 2+ and symmetric Skin: Skin color, texture, turgor normal. No rashes or lesions Neurologic: Grossly normal  EKG not performed -- personally reviewed   ASSESSMENT AND PLAN:   1. HTN: controlled on current regimen. Tolerating increased dose of amlodipine well. She is on 2 diuretics for chronic diastolic HF and HTN, Lasix and spironolactone. Plan to check repeat BMP today for monitoring of renal function and electrolytes.  2. Chronic Diastolic HF: LHC 3/82/50 with normal coronaries, EF 55-65%, normal LVEDP. Weight is stable. BP controlled. No increased dyspnea. Continue diuretics and daily weights. Low sodium.   3. COPD: on chronic home O2.   4. OSA:  Compliant with CPAP    Follow-Up w/ Dr. Radford Pax in 6 months.   Stephanie Schultz, MHS [facility] 04/30/2017 8:05 AM

## 2017-05-02 ENCOUNTER — Telehealth: Payer: Self-pay | Admitting: *Deleted

## 2017-05-02 DIAGNOSIS — Z79899 Other long term (current) drug therapy: Secondary | ICD-10-CM

## 2017-05-02 NOTE — Telephone Encounter (Signed)
-----   Message from [place], [place] sent at 05/01/2017  4:09 PM EDT ----- Slight bump in Scr from 1.19 to 1.35 (measure of renal function). Recommend repeat BMP in 1 week to insure no further rise in SCr. Potasium is normal.

## 2017-05-08 ENCOUNTER — Other Ambulatory Visit: Payer: Medicaid Other

## 2017-05-20 ENCOUNTER — Ambulatory Visit: Payer: Medicaid Other | Admitting: Podiatry

## 2017-05-27 ENCOUNTER — Telehealth: Payer: Self-pay | Admitting: Cardiology

## 2017-05-27 ENCOUNTER — Other Ambulatory Visit: Payer: Medicaid Other | Admitting: *Deleted

## 2017-05-27 DIAGNOSIS — Z79899 Other long term (current) drug therapy: Secondary | ICD-10-CM

## 2017-05-27 NOTE — Telephone Encounter (Signed)
Walk In pt Form-patient has questions about meds gave to [facility]

## 2017-05-28 LAB — BASIC METABOLIC PANEL
BUN/Creatinine Ratio: 14 (ref 9–23)
BUN: 18 mg/dL (ref 6–24)
CO2: 26 mmol/L (ref 20–29)
Calcium: 9.9 mg/dL (ref 8.7–10.2)
Chloride: 92 mmol/L — ABNORMAL LOW (ref 96–106)
Creatinine, Ser: 1.27 mg/dL — ABNORMAL HIGH (ref 0.57–1.00)
GFR, EST AFRICAN AMERICAN: 54 mL/min/{1.73_m2} — AB (ref >59)
GFR, EST NON AFRICAN AMERICAN: 47 mL/min/{1.73_m2} — AB (ref >59)
Glucose: 127 mg/dL — ABNORMAL HIGH (ref 65–99)
POTASSIUM: 4.1 mmol/L (ref 3.5–5.2)
SODIUM: 137 mmol/L (ref 134–144)

## 2017-05-30 ENCOUNTER — Telehealth: Payer: Self-pay | Admitting: Cardiology

## 2017-05-30 DIAGNOSIS — I10 Essential (primary) hypertension: Secondary | ICD-10-CM

## 2017-05-30 NOTE — Telephone Encounter (Signed)
Please get a 24 hour BP monitor 

## 2017-05-30 NOTE — Telephone Encounter (Signed)
Called to follow-up with patient. She states she has felt nauseous, dizzy, and had headaches ever since she increased her amlodipine at the beginning of June. She decreased her amlodipine back to 1/2 pill (2.5 mg) yesterday and today and feels much better. She has no blood pressures to report as her battery died in her monitor.  She states she went to PCP last week and was told her BP was "great." She will continue taking amlodipine 2.5 mg daily and will await further recommendations from Dr. Radford Pax.

## 2017-05-30 NOTE — Telephone Encounter (Signed)
Patient calling, states that her amlodipine medication has made her feel nauseous, dizzy and she has headaches. Patient states that she has been feeling like this for about a month now and cannot continue feeling this way. Please call to discuss medication.

## 2017-05-31 ENCOUNTER — Telehealth: Payer: Self-pay

## 2017-05-31 NOTE — Telephone Encounter (Signed)
A user error has taken place.

## 2017-05-31 NOTE — Telephone Encounter (Signed)
Left message to call back  

## 2017-06-03 NOTE — Telephone Encounter (Signed)
Change from amlodipine to Toprol XL 25mg  daily and check 24 hour BP monitor

## 2017-06-03 NOTE — Telephone Encounter (Signed)
Informed patient of Dr. Theodosia Blender recommendation to wear 24 hour BP monitor to see BP control on Norvasc 2.5 mg daily. Patient now reports that the symptoms didn't just start when the medication was increased to 5 mg daily last month, they started (but were far more tolerable) when she first started the medication. Since decreasing back to amlodipine 2.5 mg daily, her symptoms (dizziness, headaches, nausea) have decreased, but she does not want to take the medication any more because the headaches are so bothersome. To Dr. Radford Pax for medication recommendations prior to ordering BP monitor.

## 2017-06-03 NOTE — Telephone Encounter (Signed)
Left message to call back  

## 2017-06-04 MED ORDER — METOPROLOL SUCCINATE ER 25 MG PO TB24: 25.0000 mg | ORAL_TABLET | Freq: Every day | ORAL | 3 refills | Status: DC

## 2017-06-04 NOTE — Telephone Encounter (Signed)
Left message to call back  

## 2017-06-04 NOTE — Telephone Encounter (Signed)
Instructed patient to STOP AMLODIPINE and START TOPROL 25 mg daily. 24 hour BP monitor ordered for scheduling. Patient agrees with treatment plan.

## 2017-06-07 ENCOUNTER — Telehealth: Payer: Self-pay | Admitting: Cardiology

## 2017-06-07 NOTE — Telephone Encounter (Signed)
Follow Up:   Pt says she still have not received her blood pressure monitor and  was waiting to hear something.

## 2017-06-07 NOTE — Telephone Encounter (Signed)
Will send message to [facility] to schedule patient for BP monitor.

## 2017-06-14 ENCOUNTER — Ambulatory Visit (INDEPENDENT_AMBULATORY_CARE_PROVIDER_SITE_OTHER): Payer: Medicaid Other

## 2017-06-14 ENCOUNTER — Encounter: Payer: Self-pay | Admitting: *Deleted

## 2017-06-14 DIAGNOSIS — I10 Essential (primary) hypertension: Secondary | ICD-10-CM

## 2017-06-14 NOTE — Progress Notes (Signed)
Patient ID: Stephanie Schultz, female   DOB: [birth year], 61 y.o.   MRN: [id number] 24 hour ambulatory blood pressure monitor applied to patient using standard adult cuff.

## 2017-06-17 NOTE — Telephone Encounter (Signed)
BP monitor was placed 8/3.

## 2017-06-20 ENCOUNTER — Telehealth: Payer: Self-pay

## 2017-06-20 NOTE — Telephone Encounter (Signed)
ABPM showed average BP 114/60 and average HR 77. Per Dr. Radford Pax, "normal BP no change."  Left message to call back to discuss results.

## 2017-06-21 NOTE — Telephone Encounter (Signed)
Left message to call back  

## 2017-06-25 NOTE — Telephone Encounter (Signed)
Informed patient of blood pressure monitor results and verbal understanding expressed.

## 2017-06-27 ENCOUNTER — Telehealth: Payer: Self-pay | Admitting: Cardiology

## 2017-06-27 NOTE — Telephone Encounter (Signed)
Informed [facility] that according to current medication list, patient should be taking Spironolactone 12.5 mg daily. Triamterene is not in her current medication list.  Current medication list forwarded to PCP for review.

## 2017-06-27 NOTE — Telephone Encounter (Signed)
Follow Up:     He needs to know what medicine pt should be taking Spironolactone or Triamterene?

## 2017-07-03 ENCOUNTER — Telehealth: Payer: Self-pay | Admitting: Cardiology

## 2017-07-03 NOTE — Telephone Encounter (Signed)
Mrs. Haydu is calling because one of her medication was dropped from her list (Triamterene/HCL 37.5mg ) and was put on Metoprolol Succinate 25mg  . She is wanting to know why she was taken off and was not informed about it . Please call

## 2017-07-03 NOTE — Telephone Encounter (Signed)
Left message to call back  

## 2017-07-06 ENCOUNTER — Emergency Department (HOSPITAL_COMMUNITY)
Admission: EM | Admit: 2017-07-06 | Discharge: 2017-07-06 | Disposition: A | Discharge status: Home / Self Care | Payer: Medicaid Other | Attending: Emergency Medicine | Admitting: Emergency Medicine

## 2017-07-06 ENCOUNTER — Emergency Department (HOSPITAL_COMMUNITY): Payer: Medicaid Other

## 2017-07-06 ENCOUNTER — Other Ambulatory Visit: Payer: Self-pay

## 2017-07-06 DIAGNOSIS — R0600 Dyspnea, unspecified: Secondary | ICD-10-CM | POA: Insufficient documentation

## 2017-07-06 DIAGNOSIS — Z7982 Long term (current) use of aspirin: Secondary | ICD-10-CM | POA: Diagnosis not present

## 2017-07-06 DIAGNOSIS — I252 Old myocardial infarction: Secondary | ICD-10-CM | POA: Diagnosis not present

## 2017-07-06 DIAGNOSIS — D573 Sickle-cell trait: Secondary | ICD-10-CM | POA: Diagnosis not present

## 2017-07-06 DIAGNOSIS — J449 Chronic obstructive pulmonary disease, unspecified: Secondary | ICD-10-CM | POA: Diagnosis not present

## 2017-07-06 DIAGNOSIS — E119 Type 2 diabetes mellitus without complications: Secondary | ICD-10-CM | POA: Diagnosis not present

## 2017-07-06 DIAGNOSIS — R0602 Shortness of breath: Secondary | ICD-10-CM | POA: Diagnosis present

## 2017-07-06 DIAGNOSIS — I5032 Chronic diastolic (congestive) heart failure: Secondary | ICD-10-CM | POA: Insufficient documentation

## 2017-07-06 DIAGNOSIS — Z794 Long term (current) use of insulin: Secondary | ICD-10-CM | POA: Insufficient documentation

## 2017-07-06 DIAGNOSIS — I110 Hypertensive heart disease with heart failure: Secondary | ICD-10-CM | POA: Insufficient documentation

## 2017-07-06 DIAGNOSIS — Z79899 Other long term (current) drug therapy: Secondary | ICD-10-CM | POA: Insufficient documentation

## 2017-07-06 DIAGNOSIS — I509 Heart failure, unspecified: Secondary | ICD-10-CM

## 2017-07-06 LAB — CBC WITH DIFFERENTIAL/PLATELET
BASOS PCT: 0 %
Basophils Absolute: 0 10*3/uL (ref 0.0–0.1)
EOS ABS: 0.4 10*3/uL (ref 0.0–0.7)
Eosinophils Relative: 4 %
HCT: 32.8 % — ABNORMAL LOW (ref 36.0–46.0)
HEMOGLOBIN: 11 g/dL — AB (ref 12.0–15.0)
LYMPHS ABS: 2.7 10*3/uL (ref 0.7–4.0)
Lymphocytes Relative: 31 %
MCH: 27.4 pg (ref 26.0–34.0)
MCHC: 33.5 g/dL (ref 30.0–36.0)
MCV: 81.6 fL (ref 78.0–100.0)
MONO ABS: 0.3 10*3/uL (ref 0.1–1.0)
MONOS PCT: 3 %
Neutro Abs: 5.3 10*3/uL (ref 1.7–7.7)
Neutrophils Relative %: 62 %
Platelets: 215 10*3/uL (ref 150–400)
RBC: 4.02 MIL/uL (ref 3.87–5.11)
RDW: 15.6 % — AB (ref 11.5–15.5)
WBC: 8.6 10*3/uL (ref 4.0–10.5)

## 2017-07-06 LAB — BRAIN NATRIURETIC PEPTIDE: B Natriuretic Peptide: 50.1 pg/mL (ref 0.0–100.0)

## 2017-07-06 LAB — COMPREHENSIVE METABOLIC PANEL
ALBUMIN: 3.2 g/dL — AB (ref 3.5–5.0)
ALK PHOS: 28 U/L — AB (ref 38–126)
ALT: 90 U/L — ABNORMAL HIGH (ref 14–54)
ANION GAP: 7 (ref 5–15)
AST: 52 U/L — ABNORMAL HIGH (ref 15–41)
BUN: 17 mg/dL (ref 6–20)
CALCIUM: 9 mg/dL (ref 8.9–10.3)
CO2: 30 mmol/L (ref 22–32)
Chloride: 106 mmol/L (ref 101–111)
Creatinine, Ser: 1.02 mg/dL — ABNORMAL HIGH (ref 0.44–1.00)
GFR calc non Af Amer: 59 mL/min — ABNORMAL LOW (ref >60)
GLUCOSE: 81 mg/dL (ref 65–99)
POTASSIUM: 3.8 mmol/L (ref 3.5–5.1)
SODIUM: 143 mmol/L (ref 135–145)
TOTAL PROTEIN: 6.9 g/dL (ref 6.5–8.1)
Total Bilirubin: 0.6 mg/dL (ref 0.3–1.2)

## 2017-07-06 LAB — I-STAT TROPONIN, ED: Troponin i, poc: 0.01 ng/mL (ref 0.00–0.08)

## 2017-07-06 MED ORDER — IPRATROPIUM-ALBUTEROL 0.5-2.5 (3) MG/3ML IN SOLN
3.0000 mL | Freq: Once | RESPIRATORY_TRACT | Status: AC
  Administered 2017-07-06: 3 mL via RESPIRATORY_TRACT
  Filled 2017-07-06: qty 3

## 2017-07-06 MED ORDER — FUROSEMIDE 10 MG/ML IJ SOLN: 40.0000 mg | Freq: Once | INTRAMUSCULAR | Status: DC

## 2017-07-06 NOTE — ED Triage Notes (Addendum)
Pt arrived via [facility] after experiencing SOB. Pt has hx of CHF and as not ad her medication x1 week. EMS noted clear, but decreased bilateral lung sounds. Pt is alert and oriented x4. Pt ad recent exacerbation of COPD and was placed on prednisone. No c/o pain at this time.  EMS noted 94% on 2LPM [place]; 99% on 8LPM simple face mask after EMS gave breathing tx en route.

## 2017-07-06 NOTE — Discharge Instructions (Signed)
Continue your medications as currently prescribed.

## 2017-07-06 NOTE — ED Provider Notes (Signed)
Pt care assumed from Dr. Ellender Hose. Reassuring studies. Exam shows no distress. CXR without overt failure. 1-2+BLE edema. For discharge. Given IV Lasix 40 mg. Appropriate for home. She has a long and convoluted story that ultimately ends with her family having her current medications as prescribed at home for her. They were able to straighten things out with her pharmacy.   Tanna Furry, MD 07/06/17 Drema Halon

## 2017-07-06 NOTE — ED Provider Notes (Signed)
[facility] DEPT Provider Note   CSN: [id number] Arrival date & time: 07/06/17  1438     History   Chief Complaint Chief Complaint  Patient presents with  . Shortness of Breath    HPI Stephanie Schultz is a 61 y.o. female.  HPI   61 yo F with PMHx as below including dCHF, DM, HTN here witih SOB. Pt reports she has been out of her meds for the last week. She has had subsequent worsening cough and SOB. She has had orthopnea. She reports SOB is worse with exertion and lying flat, and she is having difficulty sleeping. She was also on steroids 2 weeks ago for her COPD and feels swollen due to this. She has gained weight but does not know how much. No fevers. She feels like her last CHF exacerbation. No fevers. No sputum production.  Past Medical History:  Diagnosis Date  . Anemia    chronis  . Anxiety   . Arthritis   . Cervical cancer ([place]) 1990   cervical   . Chronic diastolic CHF (congestive heart failure) ([place]) 08/10/2016  . COPD (chronic obstructive pulmonary disease) ([place])    O2 dependent. Pulmo: [name]  . Diabetes mellitus without complication ([place])    a. A1c 8.3 in 11/2015  . Emphysema   . Gallstones    s/p cholecystectomy  . GERD (gastroesophageal reflux disease)   . Gout   . History of home oxygen therapy    "2.5L; 24/7" (08/10/2016)  . Hx of cardiac catheterization    a. LHC at [facility] in [place], [place] 09/2008:  Normal coronary arteries EF 70%. b. LHC 11/2016: normal cors, normal LVEDP, EF 55-65%.  . Hyperlipidemia   . Hypertension   . Morbid obesity ([place])   . OSA on CPAP    CPAP at night   . Pneumonia   . Sickle cell trait ([place])   . Supplemental oxygen dependent    2L CONTINUOUSLEY  . Tobacco abuse    a. up to 3ppd from age 7 to 35, now 1/4 ppd (01/2013) >> Quit 10/2015    Patient Active Problem List   Diagnosis Date Noted  . Chronic diastolic CHF (congestive heart failure) ([place]) 08/10/2016  . Acute bronchitis 07/22/2016  . Acute respiratory  failure ([place]) 07/20/2016  . COPD exacerbation ([place]) 01/23/2016  . S/P laparoscopic cholecystectomy 12/13/2015  . Chronic respiratory failure ([place]) 03/31/2015  . Rib fracture 03/31/2015  . Acute respiratory failure with hypoxia ([place]) 02/17/2015  . Arthritis 12/13/2014  . Gout 03/03/2014  . Diabetic neuropathy ([place]) 03/03/2014  . Carpal tunnel syndrome 03/03/2014  . DM type 2 (diabetes mellitus, type 2) ([place]) 05/22/2013  . Anxiety 05/22/2013  . Transaminasemia 05/09/2013  . Leukocytosis 04/27/2013  . Tobacco abuse   . Chest pain   . Sickle cell trait ([place])   . Morbid obesity ([place])   . Acute on chronic respiratory failure ([place]) 01/16/2013  . Diastolic dysfunction 02/63/7858  . Hoarseness 12/22/2012  . Epistaxis, recurrent 07/08/2012  . GERD (gastroesophageal reflux disease) 09/20/2011  . Pulmonary nodule 09/20/2011  . COPD (chronic obstructive pulmonary disease) ([place]) 04/04/2011  . Diabetes mellitus type 2, controlled ([place]) 04/04/2011  . Hypertension 04/04/2011  . Obstructive sleep apnea 04/04/2011  . Chronic allergic rhinitis 04/04/2011    Past Surgical History:  Procedure Laterality Date  . CARDIAC CATHETERIZATION    . CARDIAC CATHETERIZATION N/A 12/03/2016   Procedure: Left Heart Cath and Coronary Angiography;  Surgeon: Peter M Martinique, MD;  Location:  [facility]IVE [facility];  Service: Cardiovascular;  Laterality: N/A;  . CHOLECYSTECTOMY N/A 12/13/2015   Procedure: LAPAROSCOPIC CHOLECYSTECTOMY;  Surgeon: Ralene Ok, MD;  Location: [facility] ORS;  Service: General;  Laterality: N/A;  . COLONOSCOPY  09/05/2012   Procedure: COLONOSCOPY;  Surgeon: Beryle Beams, MD;  Location: [facility] ENDOSCOPY;  Service: Endoscopy;  Laterality: N/A;  . COLONOSCOPY WITH PROPOFOL N/A 09/02/2015   Procedure: COLONOSCOPY WITH PROPOFOL;  Surgeon: Carol Ada, MD;  Location: [facility] ENDOSCOPY;  Service: Endoscopy;  Laterality: N/A;  . ESOPHAGOGASTRODUODENOSCOPY (EGD) WITH PROPOFOL N/A 03/29/2017   Procedure:  ESOPHAGOGASTRODUODENOSCOPY (EGD) WITH PROPOFOL;  Surgeon: Carol Ada, MD;  Location: [facility] ENDOSCOPY;  Service: Endoscopy;  Laterality: N/A;  . TUBAL LIGATION      OB History    No data available       Home Medications    Prior to Admission medications   Medication Sig Start Date End Date Taking? Authorizing Provider  albuterol (PROVENTIL) (2.5 MG/3ML) 0.083% nebulizer solution Take 2.5 mg by nebulization 4 (four) times daily as needed for wheezing or shortness of breath.    Yes [provider]  allopurinol (ZYLOPRIM) 100 MG tablet Take 100 mg by mouth daily.   Yes [provider]  ALPRAZolam Duanne Moron) 1 MG tablet Take 1 mg by mouth at bedtime as needed for sleep.  04/13/16  Yes [provider]  aspirin EC 81 MG tablet Take 81 mg by mouth daily.   Yes [provider]  budesonide-formoterol (SYMBICORT) 160-4.5 MCG/ACT inhaler Inhale 2 puffs into the lungs 2 (two) times daily. 05/03/15  Yes Collene Gobble, MD  Cholecalciferol (VITAMIN D PO) Take 1 tablet by mouth daily.   Yes [provider]  clobetasol cream (TEMOVATE) 4.74 % Apply 1 application topically daily as needed (irritation from pull ups).  03/25/17  Yes [provider]  cyclobenzaprine (FLEXERIL) 10 MG tablet Take 10 mg by mouth 2 (two) times daily as needed for muscle spasms.   Yes [provider]  esomeprazole (NEXIUM) 40 MG capsule Take 40 mg by mouth daily at 12 noon.   Yes [provider]  fluticasone (FLONASE) 50 MCG/ACT nasal spray Place 1 spray into both nostrils daily. Patient taking differently: Place 1 spray into both nostrils daily as needed (congestion).  03/24/16  Yes Regalado, Belkys A, MD  furosemide (LASIX) 40 MG tablet Take 1 tablet (40 mg total) by mouth daily. 12/05/16  Yes Arbutus Leas, NP  glipiZIDE (GLUCOTROL) 10 MG tablet Take 10 mg by mouth 2 (two) times daily before a meal.    Yes [provider]  HYDROcodone-acetaminophen (NORCO)  7.5-325 MG tablet Take 1 tablet by mouth every 6 (six) hours as needed for moderate pain.   Yes [provider]  insulin glargine (LANTUS) 100 unit/mL SOPN Inject 14 Units into the skin at bedtime.   Yes [provider]  Linaclotide (LINZESS) 145 MCG CAPS capsule Take 145 mcg by mouth daily.    Yes [provider]  loratadine (CLARITIN) 10 MG tablet Take 10 mg by mouth daily.   Yes [provider]  Naphazoline HCl (CLEAR EYES OP) Place 1 drop into both eyes daily.   Yes [provider]  ondansetron (ZOFRAN ODT) 4 MG disintegrating tablet Take 1 tablet (4 mg total) by mouth every 8 (eight) hours as needed for nausea or vomiting. 03/01/17  Yes Blanchie Dessert, MD  OXYGEN Inhale 1-2 L into the lungs continuous. 1 L at night and 2 L  during the day   Yes [provider]  pantoprazole (PROTONIX) 40 MG tablet Take 1 tablet (40 mg total) by mouth daily at 12 noon. Patient taking differently: Take 40 mg by mouth daily.  07/22/16  Yes Debbe Odea, MD  Potassium Chloride ER 20 MEQ TBCR Take 20 mEq by mouth daily. 12/04/16  Yes Arbutus Leas, NP  pravastatin (PRAVACHOL) 20 MG tablet Take 20 mg by mouth daily.    Yes [provider]  PROAIR HFA 108 (90 Base) MCG/ACT inhaler INHALE 2 PUFFS BY MOUTH EVERY 4 HOURS AS NEEDED FOR WHEEZING 06/14/16  Yes Byrum, Rose Fillers, MD  sitaGLIPtin (JANUVIA) 100 MG tablet Take 100 mg by mouth daily.   Yes [provider]  sodium chloride (OCEAN) 0.65 % SOLN nasal spray Place 1 spray into both nostrils daily as needed for congestion.    Yes [provider]  spironolactone (ALDACTONE) 25 MG tablet Take 0.5 tablets (12.5 mg total) by mouth daily. 12/05/16  Yes Arbutus Leas, NP  tiotropium (SPIRIVA) 18 MCG inhalation capsule Place 1 capsule (18 mcg total) into inhaler and inhale daily. 05/03/15  Yes Collene Gobble, MD  zolpidem (AMBIEN) 10 MG tablet Take 10 mg by mouth at bedtime.   Yes [provider]  metoprolol succinate (TOPROL-XL) 25 MG 24 hr tablet Take 1 tablet (25 mg total) by mouth daily. 06/04/17   Sueanne Margarita, MD    Family History Family History  Problem Relation Age of Onset  . Other Father        unaware of father's medical history  . Diabetes Mother        alive @ 77  . Myasthenia gravis Mother   . Lung cancer Paternal Aunt   . Lung cancer Paternal Grandfather   . Other Unknown        multiple siblings a&w.  . Heart attack Neg Hx   . Heart failure Neg Hx     Social History Social History  Substance Use Topics  . Smoking status: Former Smoker    Packs/day: 0.50    Years: 36.00    Types: Cigarettes    Quit date: 11/11/2015  . Smokeless tobacco: Never Used     Comment: Approx 90 pk-yrs (up to 3ppd until ~ 2009). Smoking 3 cigs per day now.  . Alcohol use No     Allergies   Patient has no known allergies.   Review of Systems Review of Systems  Constitutional: Positive for fatigue. Negative for chills and fever.  HENT: Negative for congestion and rhinorrhea.   Eyes: Negative for visual disturbance.  Respiratory: Positive for cough and shortness of breath. Negative for wheezing.   Cardiovascular: Positive for leg swelling. Negative for chest pain.  Gastrointestinal: Negative for abdominal pain, diarrhea, nausea and vomiting.  Genitourinary: Negative for dysuria and flank pain.  Musculoskeletal: Negative for neck pain and neck stiffness.  Skin: Negative for rash and wound.  Allergic/Immunologic: Negative for immunocompromised state.  Neurological: Negative for syncope, weakness and headaches.  All other systems reviewed and are negative.    Physical Exam Updated Vital Signs BP (!) 122/57 (BP Location: Right Arm)   Pulse 81   Temp 98.1 F (36.7 C) (Oral)   Resp (!) 25   Ht 5' (1.524 m)   Wt 97.5 kg (215 lb)   LMP 06/04/2000 (LMP Unknown)   SpO2 100%   BMI 41.99 kg/m   Physical Exam  Constitutional: She is oriented to  person, place, and time. She appears well-developed and well-nourished. No distress.  HENT:  Head: Normocephalic and atraumatic.  Eyes: Conjunctivae are normal.  Neck: Neck supple.  Cardiovascular: Normal rate, regular rhythm and normal heart sounds.  Exam reveals no friction rub.   No murmur heard. Pulmonary/Chest: Effort normal. No respiratory distress. She has no wheezes. She has rales (mild, bibasilar).  Abdominal: She exhibits no distension.  Musculoskeletal: She exhibits no edema.  Neurological: She is alert and oriented to person, place, and time. She exhibits normal muscle tone.  Skin: Skin is warm. Capillary refill takes less than 2 seconds.  Psychiatric: She has a normal mood and affect.  Nursing note and vitals reviewed.    ED Treatments / Results  Labs (all labs ordered are listed, but only abnormal results are displayed) Labs Reviewed  CBC WITH DIFFERENTIAL/PLATELET - Abnormal; Notable for the following:       Result Value   Hemoglobin 11.0 (*)    HCT 32.8 (*)    RDW 15.6 (*)    All other components within normal limits  COMPREHENSIVE METABOLIC PANEL - Abnormal; Notable for the following:    Creatinine, Ser 1.02 (*)    Albumin 3.2 (*)    AST 52 (*)    ALT 90 (*)    Alkaline Phosphatase 28 (*)    GFR calc non Af Amer 59 (*)    All other components within normal limits  BRAIN NATRIURETIC PEPTIDE  I-STAT TROPONIN, ED    EKG  EKG Interpretation  Date/Time:  Saturday July 06 2017 17:53:51 EDT Ventricular Rate:  73 PR Interval:    QRS Duration: 75 QT Interval:  405 QTC Calculation: 447 R Axis:   62 Text Interpretation:  Sinus rhythm Confirmed by Tanna Furry [phone]) on 07/06/2017 7:38:26 PM Also confirmed by Tanna Furry [phone]), editor Drema Pry [phone])  on 07/07/2017 8:48:03 AM       Radiology Dg Chest 2 View  Result Date: 07/06/2017 CLINICAL DATA:  Shortness of breath, dizziness, and lightheadedness for 3 days. EXAM: CHEST  2 VIEW  COMPARISON:  04/25/2017 FINDINGS: The heart size and mediastinal contours are within normal limits. Both lungs are clear. Several old left rib fracture deformities are again noted. IMPRESSION: Stable exam.  No active cardiopulmonary disease. Electronically Signed   By: Earle Gell M.D.   On: 07/06/2017 16:13    Procedures Procedures (including critical care time)  Medications Ordered in ED Medications  ipratropium-albuterol (DUONEB) 0.5-2.5 (3) MG/3ML nebulizer solution 3 mL (3 mLs Nebulization Given 07/06/17 1810)     Initial Impression / Assessment and Plan / ED Course  I have reviewed the triage vital signs and the nursing notes.  Pertinent labs & imaging results that were available during my care of the patient were reviewed by me and considered in my medical decision making (see chart for details).     61 yo F with h/o CHF, COPD, chronic resp failure here with acute on chronic SOB in setting of running out of meds, including diuretics, here with SOB. Concern for CHF exacerbation versus mild COPD - will check labs, imaging. No ischemia on EKG. Breathing tx given. Pt is o/w well appearing in NAD, suspect she may be able to be managed as outpt. No fever, no sputum production, doubt PNA or infection/sepsis.   Patient care transferred to Dr. Jeneen Rinks at the end of my shift. Patient presentation, ED course, and plan of care discussed with review of all pertinent labs and imaging. Please  see his/her note for further details regarding further ED course and disposition.   Final Clinical Impressions(s) / ED Diagnoses   Final diagnoses:  Dyspnea, unspecified type  Congestive heart failure, unspecified HF chronicity, unspecified heart failure type ([place])      Duffy Bruce, MD 07/07/17 [phone]

## 2017-07-09 NOTE — Telephone Encounter (Signed)
Patient reports no one ever told her to stop her triamterene she she is very upset. Informed her it appears it may have been stopped when she was in the hospital. She states she did not stop the medication and she is going to her PCP Tuesday to "figure out her BP medications." She hung up the phone.

## 2017-07-31 ENCOUNTER — Ambulatory Visit: Payer: Self-pay | Admitting: Podiatry

## 2017-08-07 ENCOUNTER — Ambulatory Visit (INDEPENDENT_AMBULATORY_CARE_PROVIDER_SITE_OTHER): Payer: Medicaid Other

## 2017-08-07 ENCOUNTER — Ambulatory Visit (HOSPITAL_COMMUNITY): Payer: Medicaid Other

## 2017-08-07 ENCOUNTER — Encounter (HOSPITAL_COMMUNITY): Payer: Self-pay | Admitting: Emergency Medicine

## 2017-08-07 ENCOUNTER — Ambulatory Visit (HOSPITAL_COMMUNITY)
Admission: EM | Admit: 2017-08-07 | Discharge: 2017-08-07 | Disposition: A | Discharge status: Home / Self Care | Payer: Medicaid Other | Attending: Family Medicine | Admitting: Family Medicine

## 2017-08-07 DIAGNOSIS — R05 Cough: Secondary | ICD-10-CM

## 2017-08-07 DIAGNOSIS — R059 Cough, unspecified: Secondary | ICD-10-CM

## 2017-08-07 DIAGNOSIS — R0602 Shortness of breath: Secondary | ICD-10-CM | POA: Diagnosis not present

## 2017-08-07 NOTE — Discharge Instructions (Signed)
CXR and EKG not changed from before. Symptoms could be due to recent change in medication, or viral illness. Please call your cardiologist today to discuss recent symptoms and further management needed. If experiencing worsening symptoms, chest pain, shortness of breath, wheezing, dizziness, weakness, fainting, go to the emergency department for further evaluation.

## 2017-08-07 NOTE — ED Triage Notes (Signed)
PT reports productive cough for 3 weeks with chest congestion. PT reports her ribs hurt and she has broken her ribs coughing before. PT reports urinary incontinence with coughing.  PT wears O2 24/7.

## 2017-08-07 NOTE — ED Provider Notes (Signed)
[place]    CSN: [id number] Arrival date & time: 08/07/17  3086     History   Chief Complaint Chief Complaint  Patient presents with  . Cough    HPI Stephanie Schultz is a 61 y.o. female.   61 year old female with history of anemia, cervical cancer, chronic diastolic CHF, COPD, diabetes, hyperlipidemia, hypertension comes in with 2 week history of cough, congestion. She states she is having pain at the left rib and feels like it is swollen. She states she has broken her ribs from coughing before. She was at the ED 1 month ago for dyspnea and CHF, and did feel better prior to onset of this current episode. Had left sided chest pain yesterday, stating it lasted 30 mins with diaphoresis, denies shortness of breath, weakness, dizziness. Other than episode of chest pain, has had shortness of breath, wheezing. States she has had some orthopnea as well. Denies increase leg swelling. Last weighted herself yesterday morning around 9:30am with weight of 218.2lb. She states she is supposed to weigh herself daily, but she does not. Has not needed to increase her O2. Denies fever, chills, night sweats. She has been using her albuterol nebulizer 4-5 times a day due to her current symptoms, last used prior to arrival.  Today weighs 222. I went through medications with patient, and she confirms that she has been using each medicine as directed with good compliance.       Past Medical History:  Diagnosis Date  . Anemia    chronis  . Anxiety   . Arthritis   . Cervical cancer ([place]) 1990   cervical   . Chronic diastolic CHF (congestive heart failure) ([place]) 08/10/2016  . COPD (chronic obstructive pulmonary disease) ([place])    O2 dependent. Pulmo: [name]  . Diabetes mellitus without complication ([place])    a. A1c 8.3 in 11/2015  . Emphysema   . Gallstones    s/p cholecystectomy  . GERD (gastroesophageal reflux disease)   . Gout   . History of home oxygen therapy    "2.5L;  24/7" (08/10/2016)  . Hx of cardiac catheterization    a. LHC at [facility] in [place], [place] 09/2008:  Normal coronary arteries EF 70%. b. LHC 11/2016: normal cors, normal LVEDP, EF 55-65%.  . Hyperlipidemia   . Hypertension   . Morbid obesity ([place])   . OSA on CPAP    CPAP at night   . Pneumonia   . Sickle cell trait ([place])   . Supplemental oxygen dependent    2L CONTINUOUSLEY  . Tobacco abuse    a. up to 3ppd from age 44 to 55, now 1/4 ppd (01/2013) >> Quit 10/2015    Patient Active Problem List   Diagnosis Date Noted  . Chronic diastolic CHF (congestive heart failure) ([place]) 08/10/2016  . Acute bronchitis 07/22/2016  . Acute respiratory failure ([place]) 07/20/2016  . COPD exacerbation ([place]) 01/23/2016  . S/P laparoscopic cholecystectomy 12/13/2015  . Chronic respiratory failure ([place]) 03/31/2015  . Rib fracture 03/31/2015  . Acute respiratory failure with hypoxia ([place]) 02/17/2015  . Arthritis 12/13/2014  . Gout 03/03/2014  . Diabetic neuropathy ([place]) 03/03/2014  . Carpal tunnel syndrome 03/03/2014  . DM type 2 (diabetes mellitus, type 2) ([place]) 05/22/2013  . Anxiety 05/22/2013  . Transaminasemia 05/09/2013  . Leukocytosis 04/27/2013  . Tobacco abuse   . Chest pain   . Sickle cell trait ([place])   . Morbid obesity ([place])   . Acute  on chronic respiratory failure ([place]) 01/16/2013  . Diastolic dysfunction 32/20/2542  . Hoarseness 12/22/2012  . Epistaxis, recurrent 07/08/2012  . GERD (gastroesophageal reflux disease) 09/20/2011  . Pulmonary nodule 09/20/2011  . COPD (chronic obstructive pulmonary disease) ([place]) 04/04/2011  . Diabetes mellitus type 2, controlled ([place]) 04/04/2011  . Hypertension 04/04/2011  . Obstructive sleep apnea 04/04/2011  . Chronic allergic rhinitis 04/04/2011    Past Surgical History:  Procedure Laterality Date  . CARDIAC CATHETERIZATION    . CARDIAC CATHETERIZATION N/A 12/03/2016   Procedure: Left Heart Cath and Coronary Angiography;  Surgeon: Peter M Martinique, MD;   Location: [facility];  Service: Cardiovascular;  Laterality: N/A;  . CHOLECYSTECTOMY N/A 12/13/2015   Procedure: LAPAROSCOPIC CHOLECYSTECTOMY;  Surgeon: Ralene Ok, MD;  Location: [facility];  Service: General;  Laterality: N/A;  . COLONOSCOPY  09/05/2012   Procedure: COLONOSCOPY;  Surgeon: Beryle Beams, MD;  Location: [facility];  Service: Endoscopy;  Laterality: N/A;  . COLONOSCOPY WITH PROPOFOL N/A 09/02/2015   Procedure: COLONOSCOPY WITH PROPOFOL;  Surgeon: Carol Ada, MD;  Location: [facility];  Service: Endoscopy;  Laterality: N/A;  . ESOPHAGOGASTRODUODENOSCOPY (EGD) WITH PROPOFOL N/A 03/29/2017   Procedure: ESOPHAGOGASTRODUODENOSCOPY (EGD) WITH PROPOFOL;  Surgeon: Carol Ada, MD;  Location: [facility];  Service: Endoscopy;  Laterality: N/A;  . TUBAL LIGATION      OB History    No data available       Home Medications    Prior to Admission medications   Medication Sig Start Date End Date Taking? Authorizing Provider  albuterol (PROVENTIL) (2.5 MG/3ML) 0.083% nebulizer solution Take 2.5 mg by nebulization 4 (four) times daily as needed for wheezing or shortness of breath.     [provider]  allopurinol (ZYLOPRIM) 100 MG tablet Take 100 mg by mouth daily.    [provider]  ALPRAZolam Duanne Moron) 1 MG tablet Take 1 mg by mouth at bedtime as needed for sleep.  04/13/16   [provider]  aspirin EC 81 MG tablet Take 81 mg by mouth daily.    [provider]  budesonide-formoterol (SYMBICORT) 160-4.5 MCG/ACT inhaler Inhale 2 puffs into the lungs 2 (two) times daily. 05/03/15   Collene Gobble, MD  Cholecalciferol (VITAMIN D PO) Take 1 tablet by mouth daily.    [provider]  clobetasol cream (TEMOVATE) 7.06 % Apply 1 application topically daily as needed (irritation from pull ups).  03/25/17   [provider]  cyclobenzaprine (FLEXERIL) 10 MG tablet Take 10 mg by mouth 2 (two) times daily as needed for muscle spasms.     [provider]  esomeprazole (NEXIUM) 40 MG capsule Take 40 mg by mouth daily at 12 noon.    [provider]  fluticasone (FLONASE) 50 MCG/ACT nasal spray Place 1 spray into both nostrils daily. Patient taking differently: Place 1 spray into both nostrils daily as needed (congestion).  03/24/16   Regalado, Belkys A, MD  furosemide (LASIX) 40 MG tablet Take 1 tablet (40 mg total) by mouth daily. 12/05/16   Arbutus Leas, NP  glipiZIDE (GLUCOTROL) 10 MG tablet Take 10 mg by mouth 2 (two) times daily before a meal.     [provider]  HYDROcodone-acetaminophen (NORCO) 7.5-325 MG tablet Take 1 tablet by mouth every 6 (six) hours as needed for moderate pain.    [provider]  insulin glargine (LANTUS) 100 unit/mL SOPN Inject 14 Units into the skin at bedtime.    [provider]  Linaclotide (LINZESS) 145 MCG CAPS capsule Take 145 mcg by mouth daily.     [provider]  loratadine (CLARITIN) 10 MG tablet Take 10 mg by mouth daily.    [provider]  metoprolol succinate (TOPROL-XL) 25 MG 24 hr tablet Take 1 tablet (25 mg total) by mouth daily. 06/04/17   Sueanne Margarita, MD  Naphazoline HCl (CLEAR EYES OP) Place 1 drop into both eyes daily.    [provider]  ondansetron (ZOFRAN ODT) 4 MG disintegrating tablet Take 1 tablet (4 mg total) by mouth every 8 (eight) hours as needed for nausea or vomiting. 03/01/17   Blanchie Dessert, MD  OXYGEN Inhale 1-2 L into the lungs continuous. 1 L at night and 2 L during the day    [provider]  pantoprazole (PROTONIX) 40 MG tablet Take 1 tablet (40 mg total) by mouth daily at 12 noon. Patient taking differently: Take 40 mg by mouth daily.  07/22/16   Debbe Odea, MD  Potassium Chloride ER 20 MEQ TBCR Take 20 mEq by mouth daily. 12/04/16   Arbutus Leas, NP  pravastatin (PRAVACHOL) 20 MG tablet Take 20 mg by mouth daily.     [provider]  PROAIR HFA 108 (90 Base)  MCG/ACT inhaler INHALE 2 PUFFS BY MOUTH EVERY 4 HOURS AS NEEDED FOR WHEEZING 06/14/16   Byrum, Rose Fillers, MD  sitaGLIPtin (JANUVIA) 100 MG tablet Take 100 mg by mouth daily.    [provider]  sodium chloride (OCEAN) 0.65 % SOLN nasal spray Place 1 spray into both nostrils daily as needed for congestion.     [provider]  spironolactone (ALDACTONE) 25 MG tablet Take 0.5 tablets (12.5 mg total) by mouth daily. 12/05/16   Arbutus Leas, NP  tiotropium (SPIRIVA) 18 MCG inhalation capsule Place 1 capsule (18 mcg total) into inhaler and inhale daily. 05/03/15   Collene Gobble, MD  zolpidem (AMBIEN) 10 MG tablet Take 10 mg by mouth at bedtime.    [provider]    Family History Family History  Problem Relation Age of Onset  . Other Father        unaware of father's medical history  . Diabetes Mother        alive @ 84  . Myasthenia gravis Mother   . Lung cancer Paternal Aunt   . Lung cancer Paternal Grandfather   . Other Unknown        multiple siblings a&w.  . Heart attack Neg Hx   . Heart failure Neg Hx     Social History Social History  Substance Use Topics  . Smoking status: Former Smoker    Packs/day: 0.50    Years: 36.00    Types: Cigarettes    Quit date: 11/11/2015  . Smokeless tobacco: Never Used     Comment: Approx 90 pk-yrs (up to 3ppd until ~ 2009). Smoking 3 cigs per day now.  . Alcohol use No     Allergies   Patient has no known allergies.   Review of Systems Review of Systems  Reason unable to perform ROS: See HPI as above.     Physical Exam Triage Vital Signs ED Triage Vitals  Enc Vitals Group     BP 08/07/17 1011 117/65     Pulse Rate 08/07/17 1011 60     Resp 08/07/17 1011 20     Temp 08/07/17 1011 98.5 F (36.9 C)     Temp Source 08/07/17 1011  Oral     SpO2 08/07/17 1011 98 %     Weight 08/07/17 1010 218 lb (98.9 kg)     Height --      Head Circumference --      Peak Flow --      Pain Score 08/07/17 1010 7      Pain Loc --      Pain Edu? --      Excl. in [place]? --    No data found.   Updated Vital Signs BP 117/65 (BP Location: Right Arm)   Pulse 60   Temp 98.5 F (36.9 C) (Oral)   Resp 20   Wt 218 lb (98.9 kg)   LMP 06/04/2000 (LMP Unknown)   SpO2 98%   BMI 42.58 kg/m   Physical Exam  Constitutional: She is oriented to person, place, and time. She appears well-developed and well-nourished. No distress.  HENT:  Head: Normocephalic and atraumatic.  Right Ear: Tympanic membrane, external ear and ear canal normal. Tympanic membrane is not erythematous and not bulging.  Left Ear: Tympanic membrane, external ear and ear canal normal. Tympanic membrane is not erythematous and not bulging.  Nose: Nose normal. Right sinus exhibits no maxillary sinus tenderness and no frontal sinus tenderness. Left sinus exhibits no maxillary sinus tenderness and no frontal sinus tenderness.  Mouth/Throat: Uvula is midline, oropharynx is clear and moist and mucous membranes are normal.  Eyes: Pupils are equal, round, and reactive to light. Conjunctivae are normal.  Neck: Normal range of motion. Neck supple.  Cardiovascular: Normal rate, regular rhythm and normal heart sounds.  Exam reveals no gallop and no friction rub.   No murmur heard. Pulmonary/Chest: Effort normal and breath sounds normal. She has no decreased breath sounds. She has no wheezes. She has no rhonchi. She has no rales. She exhibits tenderness (left chest wall pain, and left sided rib pain on palpation.).  Musculoskeletal:  No pitting edema  Lymphadenopathy:    She has no cervical adenopathy.  Neurological: She is alert and oriented to person, place, and time.  Skin: Skin is warm and dry.  Psychiatric: She has a normal mood and affect. Her behavior is normal. Judgment normal.     UC Treatments / Results  Labs (all labs ordered are listed, but only abnormal results are displayed) Labs Reviewed - No data to display  EKG  EKG  Interpretation None       Radiology Dg Chest 2 View  Result Date: 08/07/2017 CLINICAL DATA:  Two weeks of productive cough, left-sided chest discomfort, no fever. History of pneumonia and bronchitis and previous cough related rib fractures. The patient is on home oxygen. Discontinued smoking nearly 2 years ago. EXAM: CHEST  2 VIEW COMPARISON:  Chest x-ray of July 06, 2017. FINDINGS: The lungs are mildly hyperinflated. The interstitial markings are coarse. There is stable pleural thickening and rib deformity along the lower lateral left chest wall. The heart is top-normal in size. The pulmonary vascularity is normal. There is calcification in the wall of the aortic arch. There is multilevel degenerative disc disease of the thoracic spine with calcification of portions of the anterior longitudinal ligament. IMPRESSION: Chronic changes consistent with known COPD. No acute pneumonia nor CHF. Thoracic aortic atherosclerosis. Electronically Signed   By: David  Martinique M.D.   On: 08/07/2017 11:08    Procedures Procedures (including critical care time)  Medications Ordered in UC Medications - No data to display   Initial Impression / Assessment and Plan / UC  Course  I have reviewed the triage vital signs and the nursing notes.  Pertinent labs & imaging results that were available during my care of the patient were reviewed by me and considered in my medical decision making (see chart for details).    CXR without acute changes from previous. EKG NSR, 75bpm without acute changes. Discussed results with patient, who then states that her metoprolol was recently changed from 25mg  to 50mg , and has since had current symptoms. Patient without any coughing on exam, no obvious signs of nasal congestion, lungs clear to auscultation bilaterally without acute distress. Given no evidence of acute on chronic CHF, patient to contact cardiologist of recent development for possible medication side effects. Discussed  with patient symptoms could also be due to viral illness.  Patient agrees to contact cardiologist today for further management of symptoms. Return precautions given.   Case discussed with Dr Mannie Stabile, who agrees to plan.   Final Clinical Impressions(s) / UC Diagnoses   Final diagnoses:  Cough  Shortness of breath    New Prescriptions Discharge Medication List as of 08/07/2017 11:44 AM        Ok Edwards, PA-C 08/07/17 1803

## 2017-08-12 ENCOUNTER — Other Ambulatory Visit: Payer: Self-pay | Admitting: Internal Medicine

## 2017-08-12 DIAGNOSIS — Z1231 Encounter for screening mammogram for malignant neoplasm of breast: Secondary | ICD-10-CM

## 2017-08-22 ENCOUNTER — Emergency Department (HOSPITAL_COMMUNITY): Payer: Medicaid Other

## 2017-08-22 ENCOUNTER — Encounter (HOSPITAL_COMMUNITY): Payer: Self-pay | Admitting: Emergency Medicine

## 2017-08-22 DIAGNOSIS — G4733 Obstructive sleep apnea (adult) (pediatric): Secondary | ICD-10-CM | POA: Diagnosis present

## 2017-08-22 DIAGNOSIS — J441 Chronic obstructive pulmonary disease with (acute) exacerbation: Principal | ICD-10-CM | POA: Diagnosis present

## 2017-08-22 DIAGNOSIS — J189 Pneumonia, unspecified organism: Secondary | ICD-10-CM | POA: Diagnosis present

## 2017-08-22 DIAGNOSIS — Z833 Family history of diabetes mellitus: Secondary | ICD-10-CM

## 2017-08-22 DIAGNOSIS — E119 Type 2 diabetes mellitus without complications: Secondary | ICD-10-CM | POA: Diagnosis present

## 2017-08-22 DIAGNOSIS — Z87891 Personal history of nicotine dependence: Secondary | ICD-10-CM

## 2017-08-22 DIAGNOSIS — D573 Sickle-cell trait: Secondary | ICD-10-CM | POA: Diagnosis present

## 2017-08-22 DIAGNOSIS — Z79899 Other long term (current) drug therapy: Secondary | ICD-10-CM

## 2017-08-22 DIAGNOSIS — M109 Gout, unspecified: Secondary | ICD-10-CM | POA: Diagnosis present

## 2017-08-22 DIAGNOSIS — E785 Hyperlipidemia, unspecified: Secondary | ICD-10-CM | POA: Diagnosis present

## 2017-08-22 DIAGNOSIS — Z9981 Dependence on supplemental oxygen: Secondary | ICD-10-CM

## 2017-08-22 DIAGNOSIS — Z7951 Long term (current) use of inhaled steroids: Secondary | ICD-10-CM

## 2017-08-22 DIAGNOSIS — J440 Chronic obstructive pulmonary disease with acute lower respiratory infection: Secondary | ICD-10-CM | POA: Diagnosis present

## 2017-08-22 DIAGNOSIS — J9621 Acute and chronic respiratory failure with hypoxia: Secondary | ICD-10-CM | POA: Diagnosis present

## 2017-08-22 DIAGNOSIS — I5033 Acute on chronic diastolic (congestive) heart failure: Secondary | ICD-10-CM | POA: Diagnosis present

## 2017-08-22 DIAGNOSIS — Z7982 Long term (current) use of aspirin: Secondary | ICD-10-CM

## 2017-08-22 DIAGNOSIS — K219 Gastro-esophageal reflux disease without esophagitis: Secondary | ICD-10-CM | POA: Diagnosis present

## 2017-08-22 DIAGNOSIS — I110 Hypertensive heart disease with heart failure: Secondary | ICD-10-CM | POA: Diagnosis present

## 2017-08-22 DIAGNOSIS — Z794 Long term (current) use of insulin: Secondary | ICD-10-CM

## 2017-08-22 MED ORDER — ALBUTEROL SULFATE (2.5 MG/3ML) 0.083% IN NEBU
INHALATION_SOLUTION | RESPIRATORY_TRACT | Status: AC
  Administered 2017-08-23: 5 mg via RESPIRATORY_TRACT
  Filled 2017-08-22: qty 6

## 2017-08-22 MED ORDER — ALBUTEROL SULFATE (2.5 MG/3ML) 0.083% IN NEBU
5.0000 mg | INHALATION_SOLUTION | Freq: Once | RESPIRATORY_TRACT | Status: AC
  Administered 2017-08-22: 5 mg via RESPIRATORY_TRACT

## 2017-08-22 NOTE — ED Triage Notes (Signed)
Patient from home with increased shortness of breath.  Patient diminished initially, now with exp wheezing after 5mg  albuterol and 0.5mg  Atrovent with EMS and 125mg  Solumedrol.

## 2017-08-23 ENCOUNTER — Inpatient Hospital Stay (HOSPITAL_COMMUNITY): Payer: Medicaid Other

## 2017-08-23 ENCOUNTER — Encounter (HOSPITAL_COMMUNITY): Payer: Self-pay | Admitting: Emergency Medicine

## 2017-08-23 ENCOUNTER — Inpatient Hospital Stay (HOSPITAL_COMMUNITY)
Admission: EM | Admit: 2017-08-23 | Discharge: 2017-08-26 | DRG: 190 | Disposition: A | Discharge status: Home / Self Care | Payer: Medicaid Other | Attending: Internal Medicine | Admitting: Internal Medicine

## 2017-08-23 DIAGNOSIS — I5033 Acute on chronic diastolic (congestive) heart failure: Secondary | ICD-10-CM | POA: Diagnosis present

## 2017-08-23 DIAGNOSIS — J9621 Acute and chronic respiratory failure with hypoxia: Secondary | ICD-10-CM

## 2017-08-23 DIAGNOSIS — J9611 Chronic respiratory failure with hypoxia: Secondary | ICD-10-CM | POA: Diagnosis not present

## 2017-08-23 DIAGNOSIS — G4733 Obstructive sleep apnea (adult) (pediatric): Secondary | ICD-10-CM | POA: Diagnosis present

## 2017-08-23 DIAGNOSIS — J449 Chronic obstructive pulmonary disease, unspecified: Secondary | ICD-10-CM

## 2017-08-23 DIAGNOSIS — Z7982 Long term (current) use of aspirin: Secondary | ICD-10-CM | POA: Diagnosis not present

## 2017-08-23 DIAGNOSIS — D573 Sickle-cell trait: Secondary | ICD-10-CM | POA: Diagnosis present

## 2017-08-23 DIAGNOSIS — K219 Gastro-esophageal reflux disease without esophagitis: Secondary | ICD-10-CM | POA: Diagnosis present

## 2017-08-23 DIAGNOSIS — J189 Pneumonia, unspecified organism: Secondary | ICD-10-CM | POA: Diagnosis not present

## 2017-08-23 DIAGNOSIS — Z7951 Long term (current) use of inhaled steroids: Secondary | ICD-10-CM | POA: Diagnosis not present

## 2017-08-23 DIAGNOSIS — M109 Gout, unspecified: Secondary | ICD-10-CM | POA: Diagnosis present

## 2017-08-23 DIAGNOSIS — Z87891 Personal history of nicotine dependence: Secondary | ICD-10-CM | POA: Diagnosis not present

## 2017-08-23 DIAGNOSIS — J441 Chronic obstructive pulmonary disease with (acute) exacerbation: Secondary | ICD-10-CM | POA: Diagnosis present

## 2017-08-23 DIAGNOSIS — I110 Hypertensive heart disease with heart failure: Secondary | ICD-10-CM | POA: Diagnosis present

## 2017-08-23 DIAGNOSIS — Z794 Long term (current) use of insulin: Secondary | ICD-10-CM | POA: Diagnosis not present

## 2017-08-23 DIAGNOSIS — J9620 Acute and chronic respiratory failure, unspecified whether with hypoxia or hypercapnia: Secondary | ICD-10-CM | POA: Diagnosis present

## 2017-08-23 DIAGNOSIS — J440 Chronic obstructive pulmonary disease with acute lower respiratory infection: Secondary | ICD-10-CM | POA: Diagnosis present

## 2017-08-23 DIAGNOSIS — R0600 Dyspnea, unspecified: Secondary | ICD-10-CM | POA: Insufficient documentation

## 2017-08-23 DIAGNOSIS — Z9981 Dependence on supplemental oxygen: Secondary | ICD-10-CM | POA: Diagnosis not present

## 2017-08-23 DIAGNOSIS — Z79899 Other long term (current) drug therapy: Secondary | ICD-10-CM | POA: Diagnosis not present

## 2017-08-23 DIAGNOSIS — Z833 Family history of diabetes mellitus: Secondary | ICD-10-CM | POA: Diagnosis not present

## 2017-08-23 DIAGNOSIS — E119 Type 2 diabetes mellitus without complications: Secondary | ICD-10-CM

## 2017-08-23 DIAGNOSIS — E785 Hyperlipidemia, unspecified: Secondary | ICD-10-CM | POA: Diagnosis present

## 2017-08-23 LAB — TROPONIN I: Troponin I: <0.03 ng/mL (ref <0.03)

## 2017-08-23 LAB — BASIC METABOLIC PANEL
Anion gap: 11 (ref 5–15)
BUN: 8 mg/dL (ref 6–20)
CALCIUM: 9.2 mg/dL (ref 8.9–10.3)
CHLORIDE: 105 mmol/L (ref 101–111)
CO2: 23 mmol/L (ref 22–32)
CREATININE: 1.2 mg/dL — AB (ref 0.44–1.00)
GFR calc Af Amer: 57 mL/min — ABNORMAL LOW (ref >60)
GFR calc non Af Amer: 49 mL/min — ABNORMAL LOW (ref >60)
Glucose, Bld: 164 mg/dL — ABNORMAL HIGH (ref 65–99)
Potassium: 3.9 mmol/L (ref 3.5–5.1)
SODIUM: 139 mmol/L (ref 135–145)

## 2017-08-23 LAB — ECHOCARDIOGRAM COMPLETE: WEIGHTICAEL: 3454.4 [oz_av]

## 2017-08-23 LAB — GLUCOSE, CAPILLARY
GLUCOSE-CAPILLARY: 202 mg/dL — AB (ref 65–99)
Glucose-Capillary: 175 mg/dL — ABNORMAL HIGH (ref 65–99)
Glucose-Capillary: 169 mg/dL — ABNORMAL HIGH (ref 65–99)
Glucose-Capillary: 185 mg/dL — ABNORMAL HIGH (ref 65–99)

## 2017-08-23 LAB — CBC WITH DIFFERENTIAL/PLATELET
Basophils Absolute: 0 10*3/uL (ref 0.0–0.1)
Basophils Relative: 0 %
EOS ABS: 0.1 10*3/uL (ref 0.0–0.7)
EOS PCT: 1 %
HCT: 33.3 % — ABNORMAL LOW (ref 36.0–46.0)
HEMOGLOBIN: 11.1 g/dL — AB (ref 12.0–15.0)
Lymphocytes Relative: 10 %
Lymphs Abs: 0.8 10*3/uL (ref 0.7–4.0)
MCH: 27.4 pg (ref 26.0–34.0)
MCHC: 33.3 g/dL (ref 30.0–36.0)
MCV: 82.2 fL (ref 78.0–100.0)
MONO ABS: 0.1 10*3/uL (ref 0.1–1.0)
Monocytes Relative: 1 %
NEUTROS ABS: 7 10*3/uL (ref 1.7–7.7)
Neutrophils Relative %: 88 %
Platelets: 275 10*3/uL (ref 150–400)
RBC: 4.05 MIL/uL (ref 3.87–5.11)
RDW: 14.9 % (ref 11.5–15.5)
WBC: 8 10*3/uL (ref 4.0–10.5)

## 2017-08-23 LAB — MRSA PCR SCREENING: MRSA by PCR: NEGATIVE

## 2017-08-23 LAB — STREP PNEUMONIAE URINARY ANTIGEN: Strep Pneumo Urinary Antigen: NEGATIVE

## 2017-08-23 LAB — BRAIN NATRIURETIC PEPTIDE: B NATRIURETIC PEPTIDE 5: 10.1 pg/mL (ref 0.0–100.0)

## 2017-08-23 MED ORDER — GUAIFENESIN ER 600 MG PO TB12
600.0000 mg | ORAL_TABLET | Freq: Two times a day (BID) | ORAL | Status: DC
  Administered 2017-08-23 – 2017-08-26 (×7): 600 mg via ORAL
  Filled 2017-08-23 (×8): qty 1

## 2017-08-23 MED ORDER — DEXTROSE 5 % IV SOLN
500.0000 mg | INTRAVENOUS | Status: DC
  Administered 2017-08-23 – 2017-08-24 (×2): 500 mg via INTRAVENOUS
  Filled 2017-08-23 (×2): qty 500

## 2017-08-23 MED ORDER — ZOLPIDEM TARTRATE 5 MG PO TABS
5.0000 mg | ORAL_TABLET | Freq: Every day | ORAL | Status: DC
  Administered 2017-08-23 – 2017-08-25 (×3): 5 mg via ORAL
  Filled 2017-08-23 (×3): qty 1

## 2017-08-23 MED ORDER — CAPSAICIN 0.025 % EX CREA
TOPICAL_CREAM | Freq: Two times a day (BID) | CUTANEOUS | Status: DC | PRN
Start: 2017-08-23 — End: 2017-08-26
  Administered 2017-08-23: 1 via TOPICAL
  Filled 2017-08-23: qty 60

## 2017-08-23 MED ORDER — DEXTROSE 5 % IV SOLN
1.0000 g | INTRAVENOUS | Status: DC
  Administered 2017-08-23 – 2017-08-24 (×2): 1 g via INTRAVENOUS
  Filled 2017-08-23 (×2): qty 10

## 2017-08-23 MED ORDER — ENOXAPARIN SODIUM 40 MG/0.4ML ~~LOC~~ SOLN
40.0000 mg | SUBCUTANEOUS | Status: DC
  Administered 2017-08-23 – 2017-08-26 (×4): 40 mg via SUBCUTANEOUS
  Filled 2017-08-23 (×4): qty 0.4

## 2017-08-23 MED ORDER — SODIUM CHLORIDE 0.9 % IV SOLN
INTRAVENOUS | Status: DC
  Administered 2017-08-23: 02:00:00 via INTRAVENOUS

## 2017-08-23 MED ORDER — ALBUTEROL SULFATE (2.5 MG/3ML) 0.083% IN NEBU
5.0000 mg | INHALATION_SOLUTION | Freq: Once | RESPIRATORY_TRACT | Status: AC
  Administered 2017-08-23: 5 mg via RESPIRATORY_TRACT
  Filled 2017-08-23: qty 6

## 2017-08-23 MED ORDER — FUROSEMIDE 10 MG/ML IJ SOLN
40.0000 mg | INTRAMUSCULAR | Status: AC
  Administered 2017-08-23: 40 mg via INTRAVENOUS
  Filled 2017-08-23: qty 4

## 2017-08-23 MED ORDER — INSULIN GLARGINE 100 UNIT/ML ~~LOC~~ SOLN
18.0000 [IU] | Freq: Every day | SUBCUTANEOUS | Status: DC
  Administered 2017-08-23 – 2017-08-25 (×3): 18 [IU] via SUBCUTANEOUS
  Filled 2017-08-23 (×4): qty 0.18

## 2017-08-23 MED ORDER — SALINE SPRAY 0.65 % NA SOLN
1.0000 | Freq: Every day | NASAL | Status: DC | PRN
  Filled 2017-08-23: qty 44

## 2017-08-23 MED ORDER — LINACLOTIDE 145 MCG PO CAPS
145.0000 ug | ORAL_CAPSULE | Freq: Every day | ORAL | Status: DC
  Administered 2017-08-23 – 2017-08-26 (×4): 145 ug via ORAL
  Filled 2017-08-23 (×4): qty 1

## 2017-08-23 MED ORDER — IPRATROPIUM-ALBUTEROL 0.5-2.5 (3) MG/3ML IN SOLN: 3.0000 mL | RESPIRATORY_TRACT | Status: DC | PRN

## 2017-08-23 MED ORDER — PRAVASTATIN SODIUM 40 MG PO TABS
20.0000 mg | ORAL_TABLET | Freq: Every day | ORAL | Status: DC
Start: 2017-08-23 — End: 2017-08-26
  Administered 2017-08-23 – 2017-08-26 (×4): 20 mg via ORAL
  Filled 2017-08-23 (×4): qty 1

## 2017-08-23 MED ORDER — ALPRAZOLAM 0.5 MG PO TABS: 1.0000 mg | ORAL_TABLET | Freq: Every evening | ORAL | Status: DC | PRN

## 2017-08-23 MED ORDER — CLOBETASOL PROPIONATE 0.05 % EX CREA
1.0000 "application " | TOPICAL_CREAM | Freq: Every day | CUTANEOUS | Status: DC | PRN
  Filled 2017-08-23: qty 15

## 2017-08-23 MED ORDER — ARFORMOTEROL TARTRATE 15 MCG/2ML IN NEBU
15.0000 ug | INHALATION_SOLUTION | Freq: Two times a day (BID) | RESPIRATORY_TRACT | Status: DC
  Administered 2017-08-23 – 2017-08-26 (×7): 15 ug via RESPIRATORY_TRACT
  Filled 2017-08-23 (×7): qty 2

## 2017-08-23 MED ORDER — METHYLPREDNISOLONE SODIUM SUCC 40 MG IJ SOLR
40.0000 mg | Freq: Three times a day (TID) | INTRAMUSCULAR | Status: DC
  Administered 2017-08-23 – 2017-08-24 (×3): 40 mg via INTRAVENOUS
  Filled 2017-08-23 (×3): qty 1

## 2017-08-23 MED ORDER — PANTOPRAZOLE SODIUM 40 MG PO TBEC
40.0000 mg | DELAYED_RELEASE_TABLET | Freq: Every day | ORAL | Status: DC
Start: 2017-08-23 — End: 2017-08-26
  Administered 2017-08-23 – 2017-08-26 (×4): 40 mg via ORAL
  Filled 2017-08-23 (×4): qty 1

## 2017-08-23 MED ORDER — SODIUM CHLORIDE 0.9% FLUSH
3.0000 mL | Freq: Two times a day (BID) | INTRAVENOUS | Status: DC
  Administered 2017-08-23 – 2017-08-26 (×7): 3 mL via INTRAVENOUS

## 2017-08-23 MED ORDER — SPIRONOLACTONE 25 MG PO TABS
12.5000 mg | ORAL_TABLET | Freq: Every day | ORAL | Status: DC
  Administered 2017-08-23 – 2017-08-26 (×4): 12.5 mg via ORAL
  Filled 2017-08-23 (×4): qty 1

## 2017-08-23 MED ORDER — ALLOPURINOL 100 MG PO TABS
100.0000 mg | ORAL_TABLET | Freq: Every day | ORAL | Status: DC
  Administered 2017-08-23 – 2017-08-26 (×4): 100 mg via ORAL
  Filled 2017-08-23 (×4): qty 1

## 2017-08-23 MED ORDER — BUDESONIDE 0.5 MG/2ML IN SUSP
0.5000 mg | Freq: Two times a day (BID) | RESPIRATORY_TRACT | Status: DC
  Administered 2017-08-23 – 2017-08-26 (×7): 0.5 mg via RESPIRATORY_TRACT
  Filled 2017-08-23 (×7): qty 2

## 2017-08-23 MED ORDER — IPRATROPIUM-ALBUTEROL 0.5-2.5 (3) MG/3ML IN SOLN
3.0000 mL | Freq: Four times a day (QID) | RESPIRATORY_TRACT | Status: DC
  Administered 2017-08-23 (×4): 3 mL via RESPIRATORY_TRACT
  Filled 2017-08-23 (×4): qty 3

## 2017-08-23 MED ORDER — FUROSEMIDE 10 MG/ML IJ SOLN
40.0000 mg | Freq: Two times a day (BID) | INTRAMUSCULAR | Status: DC
  Administered 2017-08-23 – 2017-08-26 (×6): 40 mg via INTRAVENOUS
  Filled 2017-08-23 (×6): qty 4

## 2017-08-23 MED ORDER — POTASSIUM CHLORIDE CRYS ER 20 MEQ PO TBCR
20.0000 meq | EXTENDED_RELEASE_TABLET | Freq: Every day | ORAL | Status: DC
  Administered 2017-08-23 – 2017-08-26 (×4): 20 meq via ORAL
  Filled 2017-08-23 (×4): qty 1

## 2017-08-23 MED ORDER — CYCLOBENZAPRINE HCL 10 MG PO TABS
10.0000 mg | ORAL_TABLET | Freq: Two times a day (BID) | ORAL | Status: DC | PRN
  Administered 2017-08-23 – 2017-08-25 (×3): 10 mg via ORAL
  Filled 2017-08-23 (×3): qty 1

## 2017-08-23 MED ORDER — SODIUM CHLORIDE 0.9 % IV SOLN: 250.0000 mL | INTRAVENOUS | Status: DC | PRN

## 2017-08-23 MED ORDER — METOPROLOL SUCCINATE ER 50 MG PO TB24
50.0000 mg | ORAL_TABLET | Freq: Every day | ORAL | Status: DC
  Administered 2017-08-23 – 2017-08-26 (×4): 50 mg via ORAL
  Filled 2017-08-23 (×5): qty 1

## 2017-08-23 MED ORDER — ACETAMINOPHEN 325 MG PO TABS: 650.0000 mg | ORAL_TABLET | ORAL | Status: DC | PRN

## 2017-08-23 MED ORDER — ONDANSETRON HCL 4 MG/2ML IJ SOLN: 4.0000 mg | Freq: Four times a day (QID) | INTRAMUSCULAR | Status: DC | PRN

## 2017-08-23 MED ORDER — SODIUM CHLORIDE 0.9% FLUSH: 3.0000 mL | INTRAVENOUS | Status: DC | PRN

## 2017-08-23 MED ORDER — ASPIRIN EC 81 MG PO TBEC
81.0000 mg | DELAYED_RELEASE_TABLET | Freq: Every day | ORAL | Status: DC
  Administered 2017-08-23 – 2017-08-26 (×4): 81 mg via ORAL
  Filled 2017-08-23 (×4): qty 1

## 2017-08-23 MED ORDER — HYDROCODONE-ACETAMINOPHEN 7.5-325 MG PO TABS: 1.0000 | ORAL_TABLET | Freq: Four times a day (QID) | ORAL | Status: DC | PRN

## 2017-08-23 MED ORDER — LORATADINE 10 MG PO TABS
10.0000 mg | ORAL_TABLET | Freq: Every day | ORAL | Status: DC
Start: 2017-08-23 — End: 2017-08-26
  Administered 2017-08-23 – 2017-08-26 (×4): 10 mg via ORAL
  Filled 2017-08-23 (×4): qty 1
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
## 2017-08-23 NOTE — Progress Notes (Signed)
Pharmacy Antibiotic Note  Stephanie Schultz is a 61 y.o. female admitted on 08/23/2017 with pneumonia.  Pharmacy has been consulted for Ceftriaxone dosing. WBC WNL. CXR cannot rule out PNA.   Plan: -Ceftriaxone 1g IV q24h -Azithromycin per MD -Trend WBC, temp -F/U infectious work-up  Temp (24hrs), Avg:98 F (36.7 C), Min:98 F (36.7 C), Max:98 F (36.7 C)   Recent Labs Lab 08/23/17 0220  WBC 8.0  CREATININE 1.20*    CrCl cannot be calculated (Unknown ideal weight.).    No Known Allergies    Stephanie Schultz 08/23/2017 5:04 AM

## 2017-08-23 NOTE — Progress Notes (Signed)
  Echocardiogram 2D Echocardiogram has been performed.  Stephanie Schultz 08/23/2017, 2:15 PM

## 2017-08-23 NOTE — ED Notes (Signed)
Attempted to call report

## 2017-08-23 NOTE — ED Notes (Signed)
Dr. Harvest Forest ( admitting MD ) at bedside .

## 2017-08-23 NOTE — ED Provider Notes (Signed)
[facility] DEPT Provider Note   CSN: [id number] Arrival date & time: 08/22/17  2315     History   Chief Complaint Chief Complaint  Patient presents with  . Shortness of Breath    HPI Stephanie Schultz is a 61 y.o. female.  61 year old female with history of CHF as well as COPD presents with increasing dyspnea times several days and became worse tonight after she became hot and heated and the power went out. Cough is been nonproductive without fever or chills. Has had some increasing weight gain as well as increased lower extremity edema. Denies any anginal type chest pain. Called EMS and was given albuterol as well as a Medrol and does feel slightly better now. No recent vomiting or diarrhea.      Past Medical History:  Diagnosis Date  . Anemia    chronis  . Anxiety   . Arthritis   . Cervical cancer ([place]) 1990   cervical   . Chronic diastolic CHF (congestive heart failure) ([place]) 08/10/2016  . COPD (chronic obstructive pulmonary disease) ([place])    O2 dependent. Pulmo: [name]  . Diabetes mellitus without complication ([place])    a. A1c 8.3 in 11/2015  . Emphysema   . Gallstones    s/p cholecystectomy  . GERD (gastroesophageal reflux disease)   . Gout   . History of home oxygen therapy    "2.5L; 24/7" (08/10/2016)  . Hx of cardiac catheterization    a. LHC at [facility] in [place], [place] 09/2008:  Normal coronary arteries EF 70%. b. LHC 11/2016: normal cors, normal LVEDP, EF 55-65%.  . Hyperlipidemia   . Hypertension   . Morbid obesity ([place])   . OSA on CPAP    CPAP at night   . Pneumonia   . Sickle cell trait ([place])   . Supplemental oxygen dependent    2L CONTINUOUSLEY  . Tobacco abuse    a. up to 3ppd from age 39 to 75, now 1/4 ppd (01/2013) >> Quit 10/2015    Patient Active Problem List   Diagnosis Date Noted  . Chronic diastolic CHF (congestive heart failure) ([place]) 08/10/2016  . Acute bronchitis 07/22/2016  . Acute respiratory failure ([place]) 07/20/2016  . COPD  exacerbation ([place]) 01/23/2016  . S/P laparoscopic cholecystectomy 12/13/2015  . Chronic respiratory failure ([place]) 03/31/2015  . Rib fracture 03/31/2015  . Acute respiratory failure with hypoxia ([place]) 02/17/2015  . Arthritis 12/13/2014  . Gout 03/03/2014  . Diabetic neuropathy ([place]) 03/03/2014  . Carpal tunnel syndrome 03/03/2014  . DM type 2 (diabetes mellitus, type 2) ([place]) 05/22/2013  . Anxiety 05/22/2013  . Transaminasemia 05/09/2013  . Leukocytosis 04/27/2013  . Tobacco abuse   . Chest pain   . Sickle cell trait ([place])   . Morbid obesity ([place])   . Acute on chronic respiratory failure ([place]) 01/16/2013  . Diastolic dysfunction 46/96/2952  . Hoarseness 12/22/2012  . Epistaxis, recurrent 07/08/2012  . GERD (gastroesophageal reflux disease) 09/20/2011  . Pulmonary nodule 09/20/2011  . COPD (chronic obstructive pulmonary disease) ([place]) 04/04/2011  . Diabetes mellitus type 2, controlled ([place]) 04/04/2011  . Hypertension 04/04/2011  . Obstructive sleep apnea 04/04/2011  . Chronic allergic rhinitis 04/04/2011    Past Surgical History:  Procedure Laterality Date  . CARDIAC CATHETERIZATION    . CARDIAC CATHETERIZATION N/A 12/03/2016   Procedure: Left Heart Cath and Coronary Angiography;  Surgeon: Peter M Martinique, MD;  Location: [facility];  Service: Cardiovascular;  Laterality: N/A;  . CHOLECYSTECTOMY N/A  12/13/2015   Procedure: LAPAROSCOPIC CHOLECYSTECTOMY;  Surgeon: Ralene Ok, MD;  Location: [facility];  Service: General;  Laterality: N/A;  . COLONOSCOPY  09/05/2012   Procedure: COLONOSCOPY;  Surgeon: Beryle Beams, MD;  Location: [facility];  Service: Endoscopy;  Laterality: N/A;  . COLONOSCOPY WITH PROPOFOL N/A 09/02/2015   Procedure: COLONOSCOPY WITH PROPOFOL;  Surgeon: Carol Ada, MD;  Location: [facility];  Service: Endoscopy;  Laterality: N/A;  . ESOPHAGOGASTRODUODENOSCOPY (EGD) WITH PROPOFOL N/A 03/29/2017   Procedure: ESOPHAGOGASTRODUODENOSCOPY (EGD) WITH  PROPOFOL;  Surgeon: Carol Ada, MD;  Location: [facility];  Service: Endoscopy;  Laterality: N/A;  . TUBAL LIGATION      OB History    No data available       Home Medications    Prior to Admission medications   Medication Sig Start Date End Date Taking? Authorizing Provider  albuterol (PROVENTIL) (2.5 MG/3ML) 0.083% nebulizer solution Take 2.5 mg by nebulization 4 (four) times daily as needed for wheezing or shortness of breath.     [provider]  allopurinol (ZYLOPRIM) 100 MG tablet Take 100 mg by mouth daily.    [provider]  ALPRAZolam Duanne Moron) 1 MG tablet Take 1 mg by mouth at bedtime as needed for sleep.  04/13/16   [provider]  aspirin EC 81 MG tablet Take 81 mg by mouth daily.    [provider]  budesonide-formoterol (SYMBICORT) 160-4.5 MCG/ACT inhaler Inhale 2 puffs into the lungs 2 (two) times daily. 05/03/15   Collene Gobble, MD  Cholecalciferol (VITAMIN D PO) Take 1 tablet by mouth daily.    [provider]  clobetasol cream (TEMOVATE) 1.61 % Apply 1 application topically daily as needed (irritation from pull ups).  03/25/17   [provider]  cyclobenzaprine (FLEXERIL) 10 MG tablet Take 10 mg by mouth 2 (two) times daily as needed for muscle spasms.    [provider]  esomeprazole (NEXIUM) 40 MG capsule Take 40 mg by mouth daily at 12 noon.    [provider]  fluticasone (FLONASE) 50 MCG/ACT nasal spray Place 1 spray into both nostrils daily. Patient taking differently: Place 1 spray into both nostrils daily as needed (congestion).  03/24/16   Regalado, Belkys A, MD  furosemide (LASIX) 40 MG tablet Take 1 tablet (40 mg total) by mouth daily. 12/05/16   Arbutus Leas, NP  glipiZIDE (GLUCOTROL) 10 MG tablet Take 10 mg by mouth 2 (two) times daily before a meal.     [provider]  HYDROcodone-acetaminophen (NORCO) 7.5-325 MG tablet Take 1 tablet by mouth every 6 (six) hours as needed for  moderate pain.    [provider]  insulin glargine (LANTUS) 100 unit/mL SOPN Inject 14 Units into the skin at bedtime.    [provider]  Linaclotide Rolan Lipa) 145 MCG CAPS capsule Take 145 mcg by mouth daily.     [provider]  loratadine (CLARITIN) 10 MG tablet Take 10 mg by mouth daily.    [provider]  metoprolol succinate (TOPROL-XL) 25 MG 24 hr tablet Take 1 tablet (25 mg total) by mouth daily. 06/04/17   Sueanne Margarita, MD  Naphazoline HCl (CLEAR EYES OP) Place 1 drop into both eyes daily.    [provider]  ondansetron (ZOFRAN ODT) 4 MG disintegrating tablet Take 1 tablet (4 mg total) by mouth every 8 (eight) hours as needed for nausea or vomiting. 03/01/17   Blanchie Dessert, MD  OXYGEN Inhale 1-2 L  into the lungs continuous. 1 L at night and 2 L during the day    [provider]  pantoprazole (PROTONIX) 40 MG tablet Take 1 tablet (40 mg total) by mouth daily at 12 noon. Patient taking differently: Take 40 mg by mouth daily.  07/22/16   Debbe Odea, MD  Potassium Chloride ER 20 MEQ TBCR Take 20 mEq by mouth daily. 12/04/16   Arbutus Leas, NP  pravastatin (PRAVACHOL) 20 MG tablet Take 20 mg by mouth daily.     [provider]  PROAIR HFA 108 (90 Base) MCG/ACT inhaler INHALE 2 PUFFS BY MOUTH EVERY 4 HOURS AS NEEDED FOR WHEEZING 06/14/16   Byrum, Rose Fillers, MD  sitaGLIPtin (JANUVIA) 100 MG tablet Take 100 mg by mouth daily.    [provider]  sodium chloride (OCEAN) 0.65 % SOLN nasal spray Place 1 spray into both nostrils daily as needed for congestion.     [provider]  spironolactone (ALDACTONE) 25 MG tablet Take 0.5 tablets (12.5 mg total) by mouth daily. 12/05/16   Arbutus Leas, NP  tiotropium (SPIRIVA) 18 MCG inhalation capsule Place 1 capsule (18 mcg total) into inhaler and inhale daily. 05/03/15   Collene Gobble, MD  zolpidem (AMBIEN) 10 MG tablet Take 10 mg by mouth at bedtime.    [provider]    Family History Family History  Problem Relation Age of Onset  . Other Father        unaware of father's medical history  . Diabetes Mother        alive @ 30  . Myasthenia gravis Mother   . Lung cancer Paternal Aunt   . Lung cancer Paternal Grandfather   . Other Unknown        multiple siblings a&w.  . Heart attack Neg Hx   . Heart failure Neg Hx     Social History Social History  Substance Use Topics  . Smoking status: Former Smoker    Packs/day: 0.50    Years: 36.00    Types: Cigarettes    Quit date: 11/11/2015  . Smokeless tobacco: Never Used     Comment: Approx 90 pk-yrs (up to 3ppd until ~ 2009). Smoking 3 cigs per day now.  . Alcohol use No     Allergies   Patient has no known allergies.   Review of Systems Review of Systems  All other systems reviewed and are negative.    Physical Exam Updated Vital Signs BP 129/60 (BP Location: Left Arm)   Pulse 80   Temp 98 F (36.7 C) (Oral)   LMP 06/04/2000 (LMP Unknown)   SpO2 98%   Physical Exam  Constitutional: She is oriented to person, place, and time. She appears well-developed and well-nourished.  Non-toxic appearance. No distress.  HENT:  Head: Normocephalic and atraumatic.  Eyes: Pupils are equal, round, and reactive to light. Conjunctivae, EOM and lids are normal.  Neck: Normal range of motion. Neck supple. No tracheal deviation present. No thyroid mass present.  Cardiovascular: Normal rate, regular rhythm and normal heart sounds.  Exam reveals no gallop.   No murmur heard. Pulmonary/Chest: Effort normal. No stridor. No respiratory distress. She has decreased breath sounds in the right lower field and the left lower field. She has wheezes in the right lower field and the left lower field. She has no rhonchi. She has no rales.  Abdominal: Soft. Normal appearance and bowel sounds are normal. She exhibits no distension. There is no  tenderness. There is no rebound and no CVA tenderness.    Musculoskeletal: Normal range of motion. She exhibits no edema or tenderness.  Neurological: She is alert and oriented to person, place, and time. She has normal strength. No cranial nerve deficit or sensory deficit. GCS eye subscore is 4. GCS verbal subscore is 5. GCS motor subscore is 6.  Skin: Skin is warm and dry. No abrasion and no rash noted.  Psychiatric: She has a normal mood and affect. Her speech is normal and behavior is normal.  Nursing note and vitals reviewed.    ED Treatments / Results  Labs (all labs ordered are listed, but only abnormal results are displayed) Labs Reviewed  CBC WITH DIFFERENTIAL/PLATELET  BASIC METABOLIC PANEL  BRAIN NATRIURETIC PEPTIDE  TROPONIN I    EKG  EKG Interpretation  Date/Time:  Thursday August 22 2017 23:21:38 EDT Ventricular Rate:  79 PR Interval:  138 QRS Duration: 80 QT Interval:  406 QTC Calculation: 465 R Axis:   68 Text Interpretation:  Normal sinus rhythm Normal ECG No significant change since last tracing Confirmed by Lacretia Leigh ([id number]) on 08/23/2017 2:03:48 AM       Radiology Dg Chest 2 View  Result Date: 08/23/2017 CLINICAL DATA:  Acute onset of cough and fever.  Initial encounter. EXAM: CHEST  2 VIEW COMPARISON:  Chest radiograph performed 08/07/2017 FINDINGS: The lungs are well-aerated. Vascular congestion is noted. Increased interstitial markings raise concern for mild interstitial edema, though given the patient's symptoms, pneumonia might conceivably have a similar appearance. There is no evidence of pleural effusion or pneumothorax. The heart is borderline normal in size. No acute osseous abnormalities are seen. IMPRESSION: Vascular congestion. Increased interstitial markings raise concern for mild interstitial edema, though given the patient's symptoms, pneumonia might conceivably have a similar appearance. Electronically Signed   By: Garald Balding M.D.   On: 08/23/2017 00:10    Procedures Procedures  (including critical care time)  Medications Ordered in ED Medications  0.9 %  sodium chloride infusion (not administered)  albuterol (PROVENTIL) (2.5 MG/3ML) 0.083% nebulizer solution 5 mg (not administered)  albuterol (PROVENTIL) (2.5 MG/3ML) 0.083% nebulizer solution 5 mg (5 mg Nebulization Given 08/22/17 2325)     Initial Impression / Assessment and Plan / ED Course  I have reviewed the triage vital signs and the nursing notes.  Pertinent labs & imaging results that were available during my care of the patient were reviewed by me and considered in my medical decision making (see chart for details).     Patient given albuterol here for her wheezing. She already received Solu-Medrol prior to arrival. D-dimer negative and patient symptoms likely more consistent with pneumonia. Started on IV antibiotics and due to her being short of breath requiring oxygen will be admitted to the hospital  Final Clinical Impressions(s) / ED Diagnoses   Final diagnoses:  None    New Prescriptions New Prescriptions   No medications on file     Lacretia Leigh, MD 08/23/17 [phone]

## 2017-08-23 NOTE — Progress Notes (Signed)
Patient ID: Stephanie Schultz, female   DOB: 10/15/1956, 61 y.o.   MRN: [id number] Patient was admitted early this morning for worsening shortness of breath. Her medical records including this morning's H&P was reviewed by myself. I have seen and examined at bedside and explained the plan of care. Continue current antibiotics. Will continue Lasix as well. Start intravenous Solu-Medrol. Repeat AM labs.

## 2017-08-24 ENCOUNTER — Inpatient Hospital Stay (HOSPITAL_COMMUNITY): Payer: Medicaid Other

## 2017-08-24 DIAGNOSIS — J441 Chronic obstructive pulmonary disease with (acute) exacerbation: Principal | ICD-10-CM

## 2017-08-24 DIAGNOSIS — E119 Type 2 diabetes mellitus without complications: Secondary | ICD-10-CM

## 2017-08-24 DIAGNOSIS — J189 Pneumonia, unspecified organism: Secondary | ICD-10-CM

## 2017-08-24 DIAGNOSIS — I5033 Acute on chronic diastolic (congestive) heart failure: Secondary | ICD-10-CM

## 2017-08-24 LAB — CBC WITH DIFFERENTIAL/PLATELET
BASOS ABS: 0 10*3/uL (ref 0.0–0.1)
BASOS PCT: 0 %
EOS ABS: 0 10*3/uL (ref 0.0–0.7)
EOS PCT: 0 %
HCT: 34.7 % — ABNORMAL LOW (ref 36.0–46.0)
HEMOGLOBIN: 11.6 g/dL — AB (ref 12.0–15.0)
LYMPHS ABS: 1 10*3/uL (ref 0.7–4.0)
Lymphocytes Relative: 8 %
MCH: 27.4 pg (ref 26.0–34.0)
MCHC: 33.4 g/dL (ref 30.0–36.0)
MCV: 82 fL (ref 78.0–100.0)
Monocytes Absolute: 0.3 10*3/uL (ref 0.1–1.0)
Monocytes Relative: 3 %
NEUTROS PCT: 89 %
Neutro Abs: 10.3 10*3/uL — ABNORMAL HIGH (ref 1.7–7.7)
PLATELETS: 310 10*3/uL (ref 150–400)
RBC: 4.23 MIL/uL (ref 3.87–5.11)
RDW: 15 % (ref 11.5–15.5)
WBC: 11.5 10*3/uL — AB (ref 4.0–10.5)

## 2017-08-24 LAB — GLUCOSE, CAPILLARY
GLUCOSE-CAPILLARY: 231 mg/dL — AB (ref 65–99)
GLUCOSE-CAPILLARY: 293 mg/dL — AB (ref 65–99)
GLUCOSE-CAPILLARY: 276 mg/dL — AB (ref 65–99)
Glucose-Capillary: 261 mg/dL — ABNORMAL HIGH (ref 65–99)

## 2017-08-24 LAB — MAGNESIUM: MAGNESIUM: 2 mg/dL (ref 1.7–2.4)

## 2017-08-24 LAB — BASIC METABOLIC PANEL
ANION GAP: 12 (ref 5–15)
BUN: 24 mg/dL — ABNORMAL HIGH (ref 6–20)
CO2: 25 mmol/L (ref 22–32)
Calcium: 9.5 mg/dL (ref 8.9–10.3)
Chloride: 99 mmol/L — ABNORMAL LOW (ref 101–111)
Creatinine, Ser: 1.34 mg/dL — ABNORMAL HIGH (ref 0.44–1.00)
GFR, EST AFRICAN AMERICAN: 50 mL/min — AB (ref >60)
GFR, EST NON AFRICAN AMERICAN: 43 mL/min — AB (ref >60)
Glucose, Bld: 318 mg/dL — ABNORMAL HIGH (ref 65–99)
POTASSIUM: 4.4 mmol/L (ref 3.5–5.1)
SODIUM: 136 mmol/L (ref 135–145)

## 2017-08-24 LAB — LEGIONELLA PNEUMOPHILA SEROGP 1 UR AG: L. pneumophila Serogp 1 Ur Ag: NEGATIVE

## 2017-08-24 MED ORDER — INSULIN ASPART 100 UNIT/ML ~~LOC~~ SOLN
0.0000 [IU] | Freq: Every day | SUBCUTANEOUS | Status: DC
Start: 2017-08-24 — End: 2017-08-26
  Administered 2017-08-24: 3 [IU] via SUBCUTANEOUS

## 2017-08-24 MED ORDER — IPRATROPIUM-ALBUTEROL 0.5-2.5 (3) MG/3ML IN SOLN
3.0000 mL | Freq: Three times a day (TID) | RESPIRATORY_TRACT | Status: DC
  Administered 2017-08-24 – 2017-08-26 (×8): 3 mL via RESPIRATORY_TRACT
  Filled 2017-08-24 (×8): qty 3

## 2017-08-24 MED ORDER — METHYLPREDNISOLONE SODIUM SUCC 40 MG IJ SOLR
40.0000 mg | Freq: Two times a day (BID) | INTRAMUSCULAR | Status: DC
  Administered 2017-08-24 – 2017-08-25 (×2): 40 mg via INTRAVENOUS
  Filled 2017-08-24 (×2): qty 1

## 2017-08-24 MED ORDER — AZITHROMYCIN 500 MG PO TABS
500.0000 mg | ORAL_TABLET | ORAL | Status: DC
  Administered 2017-08-25: 500 mg via ORAL
  Filled 2017-08-24: qty 1

## 2017-08-24 MED ORDER — INSULIN ASPART 100 UNIT/ML ~~LOC~~ SOLN
0.0000 [IU] | Freq: Three times a day (TID) | SUBCUTANEOUS | Status: DC
  Administered 2017-08-24: 11 [IU] via SUBCUTANEOUS
  Administered 2017-08-25: 3 [IU] via SUBCUTANEOUS
  Administered 2017-08-25 (×2): 4 [IU] via SUBCUTANEOUS

## 2017-08-24 MED ORDER — DEXTROMETHORPHAN POLISTIREX ER 30 MG/5ML PO SUER
30.0000 mg | Freq: Two times a day (BID) | ORAL | Status: DC
  Administered 2017-08-24 – 2017-08-26 (×4): 30 mg via ORAL
  Filled 2017-08-24 (×4): qty 5

## 2017-08-24 MED ORDER — DEXTROSE 5 % IV SOLN
1.0000 g | INTRAVENOUS | Status: DC
  Administered 2017-08-25: 1 g via INTRAVENOUS
  Filled 2017-08-24: qty 10

## 2017-08-24 NOTE — Plan of Care (Signed)
Problem: Health Behavior/Discharge Planning: Goal: Ability to manage health-related needs will improve Outcome: Progressing Patient knowledgeable regarding home medications

## 2017-08-24 NOTE — Progress Notes (Signed)
Patient ID: Stephanie Schultz, female   DOB: [birth year], 61 y.o.   MRN: [id number]  PROGRESS NOTE    Stephanie Schultz  KGU:[id number] DOB: Jan 22, 1956 DOA: 08/23/2017 PCP: [name], MD   Brief Narrative: 61 -year-old female with history of COPD on home oxygen, diastolic CHF with EF of 62-83% with grade 1 diastolic dysfunction in 11/5174, diabetes mellitus type 2, OSA presented with worsening shortness of breath. She was admitted with hypoxia secondary to COPD exacerbation with concern for pneumonia versus CHF exacerbation and started on antibiotics and Lasix   Assessment & Plan:   Principal Problem:   Acute on chronic respiratory failure (HCC) Active Problems:   COPD (chronic obstructive pulmonary disease) (HCC)   Diabetes mellitus type 2, controlled ([place])   DM type 2 (diabetes mellitus, type 2) (HCC)  COPD exacerbation - Decrease Solu-Medrol to 40 mg IV every 12 hours. Continue nebs - Continue oxygen supplementation  Hypoxia with a concern for pneumonia - Repeat chest x-ray this morning is improving. Currently on Rocephin and Zithromax. Cultures negative so far. No temperature spikes  Probable acute on chronic diastolic heart failure - Continue IV Lasix and switch to oral Lasix tomorrow. Strict input and output, daily weights - Echo showed ejection fraction of 60-65%  Diabetes mellitus type 2 - Hold Januvia and glipizide. Continue Lantus along with sliding scale insulin  Hyperlipidemia - Continue statin  OSA - Outpatient follow-up with pulmonary   DVT prophylaxis: Lovenox Code Status:   Full Family Communication: None at bedside Disposition Plan: Home in 1-2 days  Consultants: None  Procedures:  Echo on 08/23/17 Study Conclusions  - Left ventricle: The cavity size was normal. Systolic function was   normal. The estimated ejection fraction was in the range of 60%   to 65%. Wall motion was normal; there were no regional wall   motion  abnormalities. Left ventricular diastolic function   parameters were normal.  Antimicrobials: Rocephin and Zithromax from 08/22/2017   Subjective: Patient seen and examined at bedside. She feels much better although she still short of breath with exertion. No overnight fever or vomiting or chest pain  Objective: Vitals:   08/24/17 0756 08/24/17 0800 08/24/17 0807 08/24/17 1056  BP:    122/63  Pulse:    91  Resp:      Temp:      TempSrc:      SpO2: 98% 98% 98% 91%  Weight:        Intake/Output Summary (Last 24 hours) at 08/24/17 1158 Last data filed at 08/24/17 0600  Gross per 24 hour  Intake             1020 ml  Output             1620 ml  Net             -600 ml   Filed Weights   08/23/17 0600 08/24/17 0523  Weight: 97.9 kg (215 lb 14.4 oz) 96.8 kg (213 lb 6.4 oz)    Examination:  General exam: Appears calm and comfortable  Respiratory system: Bilateral decreased breath sound at bases with scattered crackles Cardiovascular system: S1 & S2 heard, rate controlled Gastrointestinal system: Abdomen is nondistended, soft and nontender. Normal bowel sounds heard. Extremities: No cyanosis, clubbing, trace edema  Data Reviewed: I have personally reviewed following labs and imaging studies  CBC:  Recent Labs Lab 08/23/17 0220 08/24/17 0441  WBC 8.0 11.5*  NEUTROABS 7.0 10.3*  HGB 11.1* 11.6*  HCT 33.3* 34.7*  MCV 82.2 82.0  PLT 275 027   Basic Metabolic Panel:  Recent Labs Lab 08/23/17 0220 08/24/17 0441  NA 139 136  K 3.9 4.4  CL 105 99*  CO2 23 25  GLUCOSE 164* 318*  BUN 8 24*  CREATININE 1.20* 1.34*  CALCIUM 9.2 9.5  MG  --  2.0   GFR: Estimated Creatinine Clearance: 47.7 mL/min (A) (by C-G formula based on SCr of 1.34 mg/dL (H)). Liver Function Tests: No results for input(s): AST, ALT, ALKPHOS, BILITOT, PROT, ALBUMIN in the last 168 hours. No results for input(s): LIPASE, AMYLASE in the last 168 hours. No results for input(s): AMMONIA in the  last 168 hours. Coagulation Profile: No results for input(s): INR, PROTIME in the last 168 hours. Cardiac Enzymes:  Recent Labs Lab 08/23/17 0220  TROPONINI <0.03   BNP (last 3 results) No results for input(s): PROBNP in the last 8760 hours. HbA1C: No results for input(s): HGBA1C in the last 72 hours. CBG:  Recent Labs Lab 08/23/17 0813 08/23/17 1138 08/23/17 1656 08/23/17 2146 08/24/17 0735  GLUCAP 202* 175* 169* 185* 231*   Lipid Profile: No results for input(s): CHOL, HDL, LDLCALC, TRIG, CHOLHDL, LDLDIRECT in the last 72 hours. Thyroid Function Tests: No results for input(s): TSH, T4TOTAL, FREET4, T3FREE, THYROIDAB in the last 72 hours. Anemia Panel: No results for input(s): VITAMINB12, FOLATE, FERRITIN, TIBC, IRON, RETICCTPCT in the last 72 hours. Sepsis Labs: No results for input(s): PROCALCITON, LATICACIDVEN in the last 168 hours.  Recent Results (from the past 240 hour(s))  MRSA PCR Screening     Status: None   Collection Time: 08/23/17  6:59 AM  Result Value Ref Range Status   MRSA by PCR NEGATIVE NEGATIVE Final    Comment:        The GeneXpert MRSA Assay (FDA approved for NASAL specimens only), is one component of a comprehensive MRSA colonization surveillance program. It is not intended to diagnose MRSA infection nor to guide or monitor treatment for MRSA infections.          Radiology Studies: Dg Chest 2 View  Result Date: 08/24/2017 CLINICAL DATA:  Dyspnea. EXAM: CHEST  2 VIEW COMPARISON:  Radiographs of August 22, 2017. FINDINGS: The heart size and mediastinal contours are within normal limits. No pneumothorax or significant pleural effusion is noted. Minimal bibasilar subsegmental atelectasis is noted. The visualized skeletal structures are unremarkable. IMPRESSION: Minimal bibasilar subsegmental atelectasis. Electronically Signed   By: Marijo Conception, M.D.   On: 08/24/2017 08:11   Dg Chest 2 View  Result Date: 08/23/2017 CLINICAL DATA:   Acute onset of cough and fever.  Initial encounter. EXAM: CHEST  2 VIEW COMPARISON:  Chest radiograph performed 08/07/2017 FINDINGS: The lungs are well-aerated. Vascular congestion is noted. Increased interstitial markings raise concern for mild interstitial edema, though given the patient's symptoms, pneumonia might conceivably have a similar appearance. There is no evidence of pleural effusion or pneumothorax. The heart is borderline normal in size. No acute osseous abnormalities are seen. IMPRESSION: Vascular congestion. Increased interstitial markings raise concern for mild interstitial edema, though given the patient's symptoms, pneumonia might conceivably have a similar appearance. Electronically Signed   By: Garald Balding M.D.   On: 08/23/2017 00:10        Scheduled Meds: . allopurinol  100 mg Oral Daily  . arformoterol  15 mcg Nebulization BID  . aspirin EC  81 mg Oral Daily  . budesonide (PULMICORT) nebulizer solution  0.5 mg  Nebulization BID  . enoxaparin (LOVENOX) injection  40 mg Subcutaneous Q24H  . furosemide  40 mg Intravenous BID  . guaiFENesin  600 mg Oral BID  . insulin glargine  18 Units Subcutaneous QHS  . ipratropium-albuterol  3 mL Nebulization TID  . linaclotide  145 mcg Oral Daily  . loratadine  10 mg Oral Daily  . methylPREDNISolone (SOLU-MEDROL) injection  40 mg Intravenous Q8H  . metoprolol succinate  50 mg Oral Daily  . pantoprazole  40 mg Oral Daily  . potassium chloride SA  20 mEq Oral Daily  . pravastatin  20 mg Oral Daily  . sodium chloride flush  3 mL Intravenous Q12H  . spironolactone  12.5 mg Oral Daily  . zolpidem  5 mg Oral QHS   Continuous Infusions: . sodium chloride 10 mL/hr at 08/23/17 0210  . sodium chloride    . azithromycin Stopped (08/24/17 0413)  . cefTRIAXone (ROCEPHIN)  IV Stopped (08/24/17 0342)     LOS: 1 day        Aline August, MD [facility] Pager [phone]  If 7PM-7AM, please contact  night-coverage [email or web address] Password [email or web address] 08/24/2017, 11:58 AM

## 2017-08-24 NOTE — Plan of Care (Signed)
Problem: Tissue Perfusion: Goal: Risk factors for ineffective tissue perfusion will decrease Outcome: Progressing Patient O2 dependent at baseline   Problem: Activity: Goal: Risk for activity intolerance will decrease Outcome: Progressing Reports shortness of breath with exertion is improving   Problem: Fluid Volume: Goal: Ability to maintain a balanced intake and output will improve Outcome: Progressing Continues to diurese

## 2017-08-25 LAB — MAGNESIUM: MAGNESIUM: 2.4 mg/dL (ref 1.7–2.4)

## 2017-08-25 LAB — CBC WITH DIFFERENTIAL/PLATELET
Basophils Absolute: 0 10*3/uL (ref 0.0–0.1)
Basophils Relative: 0 %
EOS ABS: 0 10*3/uL (ref 0.0–0.7)
Eosinophils Relative: 0 %
HEMATOCRIT: 36.4 % (ref 36.0–46.0)
HEMOGLOBIN: 12.1 g/dL (ref 12.0–15.0)
LYMPHS ABS: 1.5 10*3/uL (ref 0.7–4.0)
Lymphocytes Relative: 10 %
MCH: 27.3 pg (ref 26.0–34.0)
MCHC: 33.2 g/dL (ref 30.0–36.0)
MCV: 82.2 fL (ref 78.0–100.0)
MONOS PCT: 4 %
Monocytes Absolute: 0.7 10*3/uL (ref 0.1–1.0)
NEUTROS PCT: 86 %
Neutro Abs: 13.9 10*3/uL — ABNORMAL HIGH (ref 1.7–7.7)
Platelets: 335 10*3/uL (ref 150–400)
RBC: 4.43 MIL/uL (ref 3.87–5.11)
RDW: 15.2 % (ref 11.5–15.5)
WBC: 16.1 10*3/uL — AB (ref 4.0–10.5)

## 2017-08-25 LAB — BASIC METABOLIC PANEL
Anion gap: 9 (ref 5–15)
BUN: 28 mg/dL — ABNORMAL HIGH (ref 6–20)
CO2: 28 mmol/L (ref 22–32)
Calcium: 9.2 mg/dL (ref 8.9–10.3)
Chloride: 101 mmol/L (ref 101–111)
Creatinine, Ser: 1.22 mg/dL — ABNORMAL HIGH (ref 0.44–1.00)
GFR calc Af Amer: 55 mL/min — ABNORMAL LOW (ref >60)
GFR calc non Af Amer: 48 mL/min — ABNORMAL LOW (ref >60)
Glucose, Bld: 245 mg/dL — ABNORMAL HIGH (ref 65–99)
Potassium: 4.4 mmol/L (ref 3.5–5.1)
Sodium: 138 mmol/L (ref 135–145)

## 2017-08-25 LAB — GLUCOSE, CAPILLARY
GLUCOSE-CAPILLARY: 148 mg/dL — AB (ref 65–99)
GLUCOSE-CAPILLARY: 177 mg/dL — AB (ref 65–99)
Glucose-Capillary: 162 mg/dL — ABNORMAL HIGH (ref 65–99)
Glucose-Capillary: 190 mg/dL — ABNORMAL HIGH (ref 65–99)

## 2017-08-25 LAB — HEMOGLOBIN A1C
Hgb A1c MFr Bld: 6.6 % — ABNORMAL HIGH (ref 4.8–5.6)
Mean Plasma Glucose: 142.72 mg/dL

## 2017-08-25 MED ORDER — VITAMIN D 1000 UNITS PO TABS
2000.0000 [IU] | ORAL_TABLET | Freq: Every day | ORAL | Status: DC
  Administered 2017-08-25 – 2017-08-26 (×2): 2000 [IU] via ORAL
  Filled 2017-08-25 (×2): qty 2

## 2017-08-25 MED ORDER — METHYLPREDNISOLONE SODIUM SUCC 40 MG IJ SOLR
40.0000 mg | Freq: Every day | INTRAMUSCULAR | Status: DC
  Administered 2017-08-26: 40 mg via INTRAVENOUS
  Filled 2017-08-25: qty 1

## 2017-08-25 MED ORDER — LEVOFLOXACIN 500 MG PO TABS
500.0000 mg | ORAL_TABLET | Freq: Every day | ORAL | Status: DC
  Administered 2017-08-26: 500 mg via ORAL
  Filled 2017-08-25: qty 1

## 2017-08-25 NOTE — Evaluation (Signed)
Physical Therapy Evaluation Patient Details Name: Kimani Bedoya MRN: [id number] DOB: 12/23/1955 Today's Date: 08/25/2017   History of Present Illness  Stephanie Schultz is a 61 y.o. female with medical history significant of COPD on home oxygen, diastolic CHF last EF 41-96% with grade 1 dFx in 12/2015, DM Type 2, OSA,; who presents with complaints of breathing difficulty over the last week.  Clinical Impression   Pt admitted with above diagnosis. Pt currently with functional limitations due to the deficits listed below (see PT Problem List). Overall moving well, however noted pt desatted to 86% with amb on 2 L supplemental O2; cues for focused breathing; Titrated up to 3 L with activity;  Pt will benefit from skilled PT to increase their independence and safety with mobility to allow discharge to the venue listed below.       Follow Up Recommendations No PT follow up;Other (comment) (Consider HHRN for chronic disease management)    Equipment Recommendations  None recommended by PT    Recommendations for Other Services       Precautions / Restrictions Precautions Precaution Comments: watch O2 sats      Mobility  Bed Mobility Overal bed mobility: Independent                Transfers Overall transfer level: Independent                  Ambulation/Gait Ambulation/Gait assistance: Supervision Ambulation Distance (Feet): 150 Feet Assistive device: None Gait Pattern/deviations: Step-through pattern;Decreased step length - right;Decreased step length - left;Decreased stride length   Gait velocity interpretation: Below normal speed for age/gender General Gait Details: Cues to self-monitor for activity tolerance; O2 sats dropped to 86% observed lowest; titrated up to 3 L, and pt made a quick recovery with seated rest, then dialed back down to 2 liters  Stairs            Wheelchair Mobility    Modified Rankin (Stroke Patients Only)        Balance Overall balance assessment: Modified Independent                                           Pertinent Vitals/Pain      Home Living Family/patient expects to be discharged to:: Private residence Living Arrangements: Alone Available Help at Discharge: Personal care attendant (7 days/week, a few hours) Type of Home: Apartment Home Access: Level entry     Home Layout: One level Home Equipment: Walker - 2 wheels;Cane - single point;Electric scooter;Grab bars - tub/shower;Hand held shower head;Shower seat;Bedside commode Additional Comments: Uses supplemental O2 at home    Prior Function Level of Independence: Needs assistance   Gait / Transfers Assistance Needed: independent  ADL's / Homemaking Assistance Needed: Personal [employer] attendant helps with IADLs        Hand Dominance   Dominant Hand: Right    Extremity/Trunk Assessment   Upper Extremity Assessment Upper Extremity Assessment: Overall WFL for tasks assessed    Lower Extremity Assessment Lower Extremity Assessment: Generalized weakness       Communication   Communication: No difficulties  Cognition Arousal/Alertness: Awake/alert Behavior During Therapy: WFL for tasks assessed/performed Overall Cognitive Status: Within Functional Limits for tasks assessed  General Comments      Exercises     Assessment/Plan    PT Assessment Patient needs continued PT services  PT Problem List Decreased activity tolerance;Decreased mobility;Decreased knowledge of use of DME;Decreased knowledge of precautions;Cardiopulmonary status limiting activity       PT Treatment Interventions DME instruction;Gait training;Stair training;Functional mobility training;Therapeutic activities;Therapeutic exercise;Patient/family education    PT Goals (Current goals can be found in the Care Plan section)  Acute Rehab PT Goals Patient Stated Goal: breathe  better PT Goal Formulation: With patient Time For Goal Achievement: 09/01/17 (Will likely meet goals in 1-2 sessions) Potential to Achieve Goals: Good    Frequency Min 3X/week   Barriers to discharge        Co-evaluation               AM-PAC PT "6 Clicks" Daily Activity  Outcome Measure Difficulty turning over in bed (including adjusting bedclothes, sheets and blankets)?: None Difficulty moving from lying on back to sitting on the side of the bed? : None Difficulty sitting down on and standing up from a chair with arms (e.g., wheelchair, bedside commode, etc,.)?: None Help needed moving to and from a bed to chair (including a wheelchair)?: None Help needed walking in hospital room?: None Help needed climbing 3-5 steps with a railing? : A Little 6 Click Score: 23    End of Session Equipment Utilized During Treatment: Oxygen Activity Tolerance: Patient tolerated treatment well Patient left: in chair;with call bell/phone within reach Nurse Communication: Mobility status PT Visit Diagnosis: Difficulty in walking, not elsewhere classified (R26.2);Other (comment) (Decreased functional capacity)    Time: 4709-6283 PT Time Calculation (min) (ACUTE ONLY): 16 min   Charges:   PT Evaluation $PT Eval Low Complexity: 1 Low     PT G Codes:        Roney Marion, PT  Acute Rehabilitation Services Pager [phone] Office [phone]   [place] 08/25/2017, 4:18 PM

## 2017-08-25 NOTE — Plan of Care (Signed)
Problem: Tissue Perfusion: Goal: Risk factors for ineffective tissue perfusion will decrease Outcome: Progressing Patient O2 dependent at baseline   Problem: Fluid Volume: Goal: Ability to maintain a balanced intake and output will improve Outcome: Progressing Continues to diurese

## 2017-08-25 NOTE — Progress Notes (Signed)
Patient ID: Stephanie Schultz, female   DOB: 1956-05-14, 61 y.o.   MRN: [id number]  PROGRESS NOTE    Stephanie Schultz  CWC:[id number] DOB: Oct 16, 1956 DOA: 08/23/2017 PCP: [name], MD   Brief Narrative: 61 -year-old female with history of COPD on home oxygen, diastolic CHF with EF of 76-16% with grade 1 diastolic dysfunction in 0/7371, diabetes mellitus type 2, OSA presented with worsening shortness of breath. She was admitted with hypoxia secondary to COPD exacerbation with concern for pneumonia versus CHF exacerbation and started on antibiotics and Lasix.    Assessment & Plan:   Principal Problem:   Acute on chronic respiratory failure (HCC) Active Problems:   COPD (chronic obstructive pulmonary disease) (HCC)   Diabetes mellitus type 2, controlled ([place])   DM type 2 (diabetes mellitus, type 2) (HCC)  COPD exacerbation - improving. Decrease Solu-Medrol to 40 mg IV every daily and switch to oral prednisone tomorrow. Continue nebs - Continue oxygen supplementation  Hypoxia with a concern for pneumonia - Currently on Rocephin and Zithromax. Switch to oral Levaquin. Cultures negative so far. No temperature spikes  Probable acute on chronic diastolic heart failure - Continue IV Lasix and switch to oral Lasix tomorrow. Strict input and output, daily weights. Negative fluid balance of 3051.7 mL since admission. - Echo showed ejection fraction of 60-65%  Diabetes mellitus type 2 - Hold Januvia and glipizide. Continue Lantus along with sliding scale insulin  Hyperlipidemia - Continue statin  OSA - Outpatient follow-up with pulmonary   DVT prophylaxis: Lovenox Code Status:   Full Family Communication: None at bedside Disposition Plan: Home in 1-2 days  Consultants: None  Procedures:  Echo on 08/23/17 Study Conclusions  - Left ventricle: The cavity size was normal. Systolic function was   normal. The estimated ejection fraction was in the range of 60%  to 65%. Wall motion was normal; there were no regional wall   motion abnormalities. Left ventricular diastolic function   parameters were normal.  Antimicrobials: Rocephin and Zithromax from 08/22/2017   Subjective: Patient seen and examined at bedside. She feels much better although she still short of breath with exertion. She does not feel that she is ready for discharge yet. No overnight fever or vomiting or chest pain  Objective: Vitals:   08/24/17 1924 08/24/17 2048 08/25/17 0500 08/25/17 0603  BP:  126/62  (!) 109/57  Pulse:  86  65  Resp:  20  16  Temp:  97.9 F (36.6 C)  (!) 97.3 F (36.3 C)  TempSrc:  Oral  Oral  SpO2: 94% 96%  97%  Weight:    96.9 kg (213 lb 9.6 oz)  Height:   5\' 2"  (1.575 m)     Intake/Output Summary (Last 24 hours) at 08/25/17 1035 Last data filed at 08/25/17 0603  Gross per 24 hour  Intake           628.34 ml  Output             1800 ml  Net         -1171.66 ml   Filed Weights   08/23/17 0600 08/24/17 0523 08/25/17 0603  Weight: 97.9 kg (215 lb 14.4 oz) 96.8 kg (213 lb 6.4 oz) 96.9 kg (213 lb 9.6 oz)    Examination:  General exam: Appears calm and comfortable  Respiratory system: Bilateral decreased breath sound at bases with scattered crackles and mild wheezing Cardiovascular system: S1 & S2 heard, rate controlled Gastrointestinal system: Abdomen is nondistended,  soft and nontender. Normal bowel sounds heard. Extremities: No cyanosis, clubbing, trace edema  Data Reviewed: I have personally reviewed following labs and imaging studies  CBC:  Recent Labs Lab 08/23/17 0220 08/24/17 0441 08/25/17 0445  WBC 8.0 11.5* 16.1*  NEUTROABS 7.0 10.3* 13.9*  HGB 11.1* 11.6* 12.1  HCT 33.3* 34.7* 36.4  MCV 82.2 82.0 82.2  PLT 275 310 664   Basic Metabolic Panel:  Recent Labs Lab 08/23/17 0220 08/24/17 0441 08/25/17 0445  NA 139 136 138  K 3.9 4.4 4.4  CL 105 99* 101  CO2 23 25 28   GLUCOSE 164* 318* 245*  BUN 8 24* 28*    CREATININE 1.20* 1.34* 1.22*  CALCIUM 9.2 9.5 9.2  MG  --  2.0 2.4   GFR: Estimated Creatinine Clearance: 53.9 mL/min (A) (by C-G formula based on SCr of 1.22 mg/dL (H)). Liver Function Tests: No results for input(s): AST, ALT, ALKPHOS, BILITOT, PROT, ALBUMIN in the last 168 hours. No results for input(s): LIPASE, AMYLASE in the last 168 hours. No results for input(s): AMMONIA in the last 168 hours. Coagulation Profile: No results for input(s): INR, PROTIME in the last 168 hours. Cardiac Enzymes:  Recent Labs Lab 08/23/17 0220  TROPONINI <0.03   BNP (last 3 results) No results for input(s): PROBNP in the last 8760 hours. HbA1C:  Recent Labs  08/25/17 0445  HGBA1C 6.6*   CBG:  Recent Labs Lab 08/24/17 0735 08/24/17 1148 08/24/17 1635 08/24/17 2125 08/25/17 0839  GLUCAP 231* 261* 293* 276* 162*   Lipid Profile: No results for input(s): CHOL, HDL, LDLCALC, TRIG, CHOLHDL, LDLDIRECT in the last 72 hours. Thyroid Function Tests: No results for input(s): TSH, T4TOTAL, FREET4, T3FREE, THYROIDAB in the last 72 hours. Anemia Panel: No results for input(s): VITAMINB12, FOLATE, FERRITIN, TIBC, IRON, RETICCTPCT in the last 72 hours. Sepsis Labs: No results for input(s): PROCALCITON, LATICACIDVEN in the last 168 hours.  Recent Results (from the past 240 hour(s))  MRSA PCR Screening     Status: None   Collection Time: 08/23/17  6:59 AM  Result Value Ref Range Status   MRSA by PCR NEGATIVE NEGATIVE Final    Comment:        The GeneXpert MRSA Assay (FDA approved for NASAL specimens only), is one component of a comprehensive MRSA colonization surveillance program. It is not intended to diagnose MRSA infection nor to guide or monitor treatment for MRSA infections.          Radiology Studies: Dg Chest 2 View  Result Date: 08/24/2017 CLINICAL DATA:  Dyspnea. EXAM: CHEST  2 VIEW COMPARISON:  Radiographs of August 22, 2017. FINDINGS: The heart size and  mediastinal contours are within normal limits. No pneumothorax or significant pleural effusion is noted. Minimal bibasilar subsegmental atelectasis is noted. The visualized skeletal structures are unremarkable. IMPRESSION: Minimal bibasilar subsegmental atelectasis. Electronically Signed   By: Marijo Conception, M.D.   On: 08/24/2017 08:11        Scheduled Meds: . allopurinol  100 mg Oral Daily  . arformoterol  15 mcg Nebulization BID  . aspirin EC  81 mg Oral Daily  . azithromycin  500 mg Oral Q24H  . budesonide (PULMICORT) nebulizer solution  0.5 mg Nebulization BID  . dextromethorphan  30 mg Oral BID  . enoxaparin (LOVENOX) injection  40 mg Subcutaneous Q24H  . furosemide  40 mg Intravenous BID  . guaiFENesin  600 mg Oral BID  . insulin aspart  0-20 Units Subcutaneous TID WC  .  insulin aspart  0-5 Units Subcutaneous QHS  . insulin glargine  18 Units Subcutaneous QHS  . ipratropium-albuterol  3 mL Nebulization TID  . linaclotide  145 mcg Oral Daily  . loratadine  10 mg Oral Daily  . methylPREDNISolone (SOLU-MEDROL) injection  40 mg Intravenous Q12H  . metoprolol succinate  50 mg Oral Daily  . pantoprazole  40 mg Oral Daily  . potassium chloride SA  20 mEq Oral Daily  . pravastatin  20 mg Oral Daily  . sodium chloride flush  3 mL Intravenous Q12H  . spironolactone  12.5 mg Oral Daily  . zolpidem  5 mg Oral QHS   Continuous Infusions: . sodium chloride Stopped (08/25/17 0500)  . sodium chloride    . cefTRIAXone (ROCEPHIN)  IV Stopped (08/25/17 0835)     LOS: 2 days        Aline August, MD [facility] Pager [phone]  If 7PM-7AM, please contact night-coverage [email or web address] Password [email or web address] 08/25/2017, 10:35 AM

## 2017-08-26 DIAGNOSIS — J9611 Chronic respiratory failure with hypoxia: Secondary | ICD-10-CM

## 2017-08-26 LAB — BASIC METABOLIC PANEL
ANION GAP: 11 (ref 5–15)
BUN: 31 mg/dL — ABNORMAL HIGH (ref 6–20)
CALCIUM: 9.3 mg/dL (ref 8.9–10.3)
CO2: 30 mmol/L (ref 22–32)
Chloride: 98 mmol/L — ABNORMAL LOW (ref 101–111)
Creatinine, Ser: 1.24 mg/dL — ABNORMAL HIGH (ref 0.44–1.00)
GFR calc non Af Amer: 47 mL/min — ABNORMAL LOW (ref >60)
GFR, EST AFRICAN AMERICAN: 54 mL/min — AB (ref >60)
GLUCOSE: 104 mg/dL — AB (ref 65–99)
POTASSIUM: 3.7 mmol/L (ref 3.5–5.1)
Sodium: 139 mmol/L (ref 135–145)

## 2017-08-26 LAB — CBC WITH DIFFERENTIAL/PLATELET
BASOS PCT: 0 %
Basophils Absolute: 0 10*3/uL (ref 0.0–0.1)
Eosinophils Absolute: 0.1 10*3/uL (ref 0.0–0.7)
Eosinophils Relative: 0 %
HCT: 38.6 % (ref 36.0–46.0)
Hemoglobin: 13 g/dL (ref 12.0–15.0)
LYMPHS PCT: 34 %
Lymphs Abs: 4.5 10*3/uL — ABNORMAL HIGH (ref 0.7–4.0)
MCH: 27.7 pg (ref 26.0–34.0)
MCHC: 33.7 g/dL (ref 30.0–36.0)
MCV: 82.1 fL (ref 78.0–100.0)
MONO ABS: 0.8 10*3/uL (ref 0.1–1.0)
MONOS PCT: 6 %
NEUTROS ABS: 8 10*3/uL — AB (ref 1.7–7.7)
NEUTROS PCT: 60 %
PLATELETS: 341 10*3/uL (ref 150–400)
RBC: 4.7 MIL/uL (ref 3.87–5.11)
RDW: 15 % (ref 11.5–15.5)
WBC: 13.4 10*3/uL — ABNORMAL HIGH (ref 4.0–10.5)

## 2017-08-26 LAB — GLUCOSE, CAPILLARY
Glucose-Capillary: 101 mg/dL — ABNORMAL HIGH (ref 65–99)
Glucose-Capillary: 139 mg/dL — ABNORMAL HIGH (ref 65–99)

## 2017-08-26 LAB — MAGNESIUM: Magnesium: 2.4 mg/dL (ref 1.7–2.4)

## 2017-08-26 MED ORDER — LEVOFLOXACIN 500 MG PO TABS: 500.0000 mg | ORAL_TABLET | Freq: Every day | ORAL | 0 refills | Status: DC

## 2017-08-26 MED ORDER — GUAIFENESIN ER 600 MG PO TB12: 600.0000 mg | ORAL_TABLET | Freq: Two times a day (BID) | ORAL | 0 refills | Status: DC

## 2017-08-26 MED ORDER — PANTOPRAZOLE SODIUM 40 MG PO TBEC: 40.0000 mg | DELAYED_RELEASE_TABLET | Freq: Every day | ORAL | Status: DC

## 2017-08-26 MED ORDER — INSULIN ASPART 100 UNIT/ML ~~LOC~~ SOLN
0.0000 [IU] | Freq: Three times a day (TID) | SUBCUTANEOUS | Status: DC
Start: 2017-08-26 — End: 2017-08-26
  Administered 2017-08-26: 3 [IU] via SUBCUTANEOUS

## 2017-08-26 MED ORDER — PREDNISONE 20 MG PO TABS: 40.0000 mg | ORAL_TABLET | Freq: Every day | ORAL | Status: DC

## 2017-08-26 MED ORDER — DEXTROMETHORPHAN POLISTIREX ER 30 MG/5ML PO SUER: 30.0000 mg | Freq: Two times a day (BID) | ORAL | 0 refills | Status: DC

## 2017-08-26 MED ORDER — METOPROLOL SUCCINATE ER 25 MG PO TB24: 50.0000 mg | ORAL_TABLET | Freq: Every day | ORAL | Status: DC

## 2017-08-26 MED ORDER — FLUTICASONE PROPIONATE 50 MCG/ACT NA SUSP: 1.0000 | Freq: Every day | NASAL | Status: DC | PRN

## 2017-08-26 MED ORDER — FUROSEMIDE 40 MG PO TABS: 40.0000 mg | ORAL_TABLET | Freq: Two times a day (BID) | ORAL | Status: DC

## 2017-08-26 MED ORDER — PREDNISONE 20 MG PO TABS: 40.0000 mg | ORAL_TABLET | Freq: Every day | ORAL | 0 refills | Status: AC

## 2017-08-26 NOTE — Progress Notes (Addendum)
CM talked to patient about DCP; patient lives at home but does not have any electricity at this time but she has notified [employer] and plans to have Electricity on at her home today; CM encouraged patient to have a back up plan in the future for what to do when her power goes out.  She has home oxygen through [place]; nebulizer machine. She is independent of her ADL's , no DME, does not qualify for  [facility] RN  because she is not home bound and she does not want a HHRN; PCP is Dr Irene Pap and has private insurance with Medicaid with prescription drug coverage. Mindi Slicker RN,MHA,BSN [phone]  1;30 pm - CM talked to patient again, she stated that her power/ Electricity is on, she is requesting a taxi for home and Butch Penny with [place] [employer] called for portable 02 tank for home. Mindi Slicker [facility] [phone]

## 2017-08-26 NOTE — Discharge Summary (Signed)
Physician Discharge Summary  Stephanie Schultz RUE:[id number] DOB: 1956/07/09 DOA: 08/23/2017  PCP: [name], MD  Admit date: 08/23/2017 Discharge date: 08/26/2017  Admitted From: Home Disposition:  Home  Recommendations for Outpatient Follow-up:  1. Follow up with PCP in 1 week with repeat BMP 2. Follow up with Cardiology/Dr. Radford Pax in 1-2 weeks   Home Health: No  Equipment/Devices: Nasal cannula oxygen  Discharge Condition: [place]  CODE STATUS: Full  Diet recommendation: Heart Healthy / Carb Modified / Fluid restriction to 1275ml per day  Brief/Interim Summary: 47 -year-old female with history of COPD on home oxygen, diastolic CHF with EF of 14-78% with grade 1 diastolic dysfunction in 12/9560, diabetes mellitus type 2, OSA presented with worsening shortness of breath. She was admitted with hypoxia secondary to COPD exacerbation with concern for pneumonia versus CHF exacerbation and started on antibiotics and Lasix. She was also put on iv solumedrol. Her condition has much improved. Iv antibiotics have been switched to oral levaquin. She has been switched to oral prednisone and oral lasix is ready for discharge.  Discharge Diagnoses:  Principal Problem:   Acute on chronic respiratory failure (HCC) Active Problems:   COPD (chronic obstructive pulmonary disease) (HCC)   Diabetes mellitus type 2, controlled ([place])   DM type 2 (diabetes mellitus, type 2) (HCC)  COPD exacerbation - treated with iv solumedrol. Symptoms have much improved. Switch to oral Prednisone 40mg  daily for 7 days.  - Continue Symbicort/nebs. Outpatient follow-up with Pulmonary  Chronic hypoxic respiratory failure - continue oxygen via nasal cannula.   Hypoxia with a concern for pneumonia - Initially started on Rocephin and Zithromax and switched to oral Levaquin on 08/25/17. Discharge home on Levaquin for 3 more days. Cultures negative so far. No temperature spikes  Probable acute on chronic  diastolic heart failure - treated with iv lasix. Negative fluid balance of 4061.7 mL since admission. - Echo showed ejection fraction of 60-65% - outpatient follow up with Cardiology.  - Continue Lasix 40 mg daily. Follow BMP in a week with PCP  Diabetes mellitus type 2 - Outpatient follow up. Continue Glipizide and Januvia  Hyperlipidemia - Continue statin  OSA - Outpatient follow-up with pulmonary  Discharge Instructions  Discharge Instructions    (HEART FAILURE PATIENTS) Call MD:  Anytime you have any of the following symptoms: 1) 3 pound weight gain in 24 hours or 5 pounds in 1 week 2) shortness of breath, with or without a dry hacking cough 3) swelling in the hands, feet or stomach 4) if you have to sleep on extra pillows at night in order to breathe.    Complete by:  As directed    Ambulatory referral to Cardiology    Complete by:  As directed    Follow up in 1-2 weeks   Call MD for:  difficulty breathing, headache or visual disturbances    Complete by:  As directed    Call MD for:  extreme fatigue    Complete by:  As directed    Call MD for:  hives    Complete by:  As directed    Call MD for:  persistant dizziness or light-headedness    Complete by:  As directed    Call MD for:  persistant nausea and vomiting    Complete by:  As directed    Call MD for:  severe uncontrolled pain    Complete by:  As directed    Call MD for:  temperature >100.4  Complete by:  As directed    Diet - low sodium heart healthy    Complete by:  As directed    Diet Carb Modified    Complete by:  As directed    Increase activity slowly    Complete by:  As directed      Allergies as of 08/26/2017   No Known Allergies     Medication List    STOP taking these medications   esomeprazole 40 MG capsule Commonly known as:  NEXIUM   sulfamethoxazole-trimethoprim 800-160 MG tablet Commonly known as:  BACTRIM DS,SEPTRA DS     TAKE these medications   albuterol (2.5 MG/3ML) 0.083%  nebulizer solution Commonly known as:  PROVENTIL Take 2.5 mg by nebulization 4 (four) times daily as needed for wheezing or shortness of breath.   PROAIR HFA 108 (90 Base) MCG/ACT inhaler Generic drug:  albuterol INHALE 2 PUFFS BY MOUTH EVERY 4 HOURS AS NEEDED FOR WHEEZING   allopurinol 100 MG tablet Commonly known as:  ZYLOPRIM Take 100 mg by mouth daily.   ALPRAZolam 1 MG tablet Commonly known as:  XANAX Take 1 mg by mouth at bedtime as needed for sleep.   aspirin EC 81 MG tablet Take 81 mg by mouth daily.   budesonide-formoterol 160-4.5 MCG/ACT inhaler Commonly known as:  SYMBICORT Inhale 2 puffs into the lungs 2 (two) times daily.   CLEAR EYES OP Place 1 drop into both eyes daily.   clobetasol cream 0.05 % Commonly known as:  TEMOVATE Apply 1 application topically daily as needed (irritation from pull ups).   cyclobenzaprine 10 MG tablet Commonly known as:  FLEXERIL Take 10 mg by mouth 2 (two) times daily as needed for muscle spasms.   dextromethorphan 30 MG/5ML liquid Commonly known as:  DELSYM Take 5 mLs (30 mg total) by mouth 2 (two) times daily.   fluticasone 50 MCG/ACT nasal spray Commonly known as:  FLONASE Place 1 spray into both nostrils daily as needed (congestion).   furosemide 40 MG tablet Commonly known as:  LASIX Take 1 tablet (40 mg total) by mouth daily.   glipiZIDE 10 MG tablet Commonly known as:  GLUCOTROL Take 10 mg by mouth 2 (two) times daily before a meal.   guaiFENesin 600 MG 12 hr tablet Commonly known as:  MUCINEX Take 1 tablet (600 mg total) by mouth 2 (two) times daily.   HYDROcodone-acetaminophen 7.5-325 MG tablet Commonly known as:  NORCO Take 1 tablet by mouth every 6 (six) hours as needed for moderate pain.   insulin glargine 100 unit/mL Sopn Commonly known as:  LANTUS Inject 18 Units into the skin at bedtime.   levofloxacin 500 MG tablet Commonly known as:  LEVAQUIN Take 1 tablet (500 mg total) by mouth daily.    LINZESS 145 MCG Caps capsule Generic drug:  linaclotide Take 145 mcg by mouth daily.   loratadine 10 MG tablet Commonly known as:  CLARITIN Take 10 mg by mouth daily.   metoprolol succinate 25 MG 24 hr tablet Commonly known as:  TOPROL-XL Take 2 tablets (50 mg total) by mouth daily.   ondansetron 4 MG disintegrating tablet Commonly known as:  ZOFRAN ODT Take 1 tablet (4 mg total) by mouth every 8 (eight) hours as needed for nausea or vomiting.   OXYGEN Inhale 1-2 L into the lungs continuous. 1 L at night and 2 L during the day   pantoprazole 40 MG tablet Commonly known as:  PROTONIX Take 1 tablet (40 mg total)  by mouth daily.   Potassium Chloride ER 20 MEQ Tbcr Take 20 mEq by mouth daily.   pravastatin 20 MG tablet Commonly known as:  PRAVACHOL Take 20 mg by mouth daily.   predniSONE 20 MG tablet Commonly known as:  DELTASONE Take 2 tablets (40 mg total) by mouth daily with breakfast.   sitaGLIPtin 100 MG tablet Commonly known as:  JANUVIA Take 100 mg by mouth daily.   sodium chloride 0.65 % Soln nasal spray Commonly known as:  OCEAN Place 1 spray into both nostrils daily as needed for congestion.   spironolactone 25 MG tablet Commonly known as:  ALDACTONE Take 0.5 tablets (12.5 mg total) by mouth daily.   tiotropium 18 MCG inhalation capsule Commonly known as:  SPIRIVA Place 1 capsule (18 mcg total) into inhaler and inhale daily.   VITAMIN D PO Take 1 tablet by mouth daily.   zolpidem 10 MG tablet Commonly known as:  AMBIEN Take 10 mg by mouth at bedtime.      Follow-up Information    [name], MD. Schedule an appointment as soon as possible for a visit in 1 week(s).   Specialty:  Internal Medicine Why:  with repeat CBC/BMP Contact information: [place] [address] [phone]        Sueanne Margarita, MD. Schedule an appointment as soon as possible for a visit on 09/09/2017.   Specialty:  Cardiology Contact  information: [address] [place] [address] [phone]          No Known Allergies  Consultations:  None   Procedures/Studies: Dg Chest 2 View  Result Date: 08/24/2017 CLINICAL DATA:  Dyspnea. EXAM: CHEST  2 VIEW COMPARISON:  Radiographs of August 22, 2017. FINDINGS: The heart size and mediastinal contours are within normal limits. No pneumothorax or significant pleural effusion is noted. Minimal bibasilar subsegmental atelectasis is noted. The visualized skeletal structures are unremarkable. IMPRESSION: Minimal bibasilar subsegmental atelectasis. Electronically Signed   By: Marijo Conception, M.D.   On: 08/24/2017 08:11   Dg Chest 2 View  Result Date: 08/23/2017 CLINICAL DATA:  Acute onset of cough and fever.  Initial encounter. EXAM: CHEST  2 VIEW COMPARISON:  Chest radiograph performed 08/07/2017 FINDINGS: The lungs are well-aerated. Vascular congestion is noted. Increased interstitial markings raise concern for mild interstitial edema, though given the patient's symptoms, pneumonia might conceivably have a similar appearance. There is no evidence of pleural effusion or pneumothorax. The heart is borderline normal in size. No acute osseous abnormalities are seen. IMPRESSION: Vascular congestion. Increased interstitial markings raise concern for mild interstitial edema, though given the patient's symptoms, pneumonia might conceivably have a similar appearance. Electronically Signed   By: Garald Balding M.D.   On: 08/23/2017 00:10   Dg Chest 2 View  Result Date: 08/07/2017 CLINICAL DATA:  Two weeks of productive cough, left-sided chest discomfort, no fever. History of pneumonia and bronchitis and previous cough related rib fractures. The patient is on home oxygen. Discontinued smoking nearly 2 years ago. EXAM: CHEST  2 VIEW COMPARISON:  Chest x-ray of July 06, 2017. FINDINGS: The lungs are mildly hyperinflated. The interstitial markings are coarse. There is stable  pleural thickening and rib deformity along the lower lateral left chest wall. The heart is top-normal in size. The pulmonary vascularity is normal. There is calcification in the wall of the aortic arch. There is multilevel degenerative disc disease of the thoracic spine with calcification of portions of the anterior longitudinal  ligament. IMPRESSION: Chronic changes consistent with known COPD. No acute pneumonia nor CHF. Thoracic aortic atherosclerosis. Electronically Signed   By: David  Martinique M.D.   On: 08/07/2017 11:08    Echo on 08/23/17 Study Conclusions  - Left ventricle: The cavity size was normal. Systolic function was normal. The estimated ejection fraction was in the range of 60% to 65%. Wall motion was normal; there were no regional wall motion abnormalities. Left ventricular diastolic function parameters were normal.   Subjective:  Patient seen and examined at bedside. She feels much better. She thinks that she is ready for discharge. No overnight fever or vomiting or chest pain. Shortness of breath is improving.   Discharge Exam: Vitals:   08/26/17 0818 08/26/17 1212  BP:  113/75  Pulse:  68  Resp:  16  Temp:  98 F (36.7 C)  SpO2: 95% 96%   Vitals:   08/25/17 2107 08/26/17 0603 08/26/17 0818 08/26/17 1212  BP: 106/71 132/67  113/75  Pulse: 73 69  68  Resp: 18 17  16   Temp: 98 F (36.7 C) 98 F (36.7 C)  98 F (36.7 C)  TempSrc: Oral Oral  Oral  SpO2: 94% 96% 95% 96%  Weight:  95.9 kg (211 lb 8 oz)    Height:        General: Pt is alert, awake, not in acute distress Cardiovascular: rate controlled, S1/S2 + Respiratory: Bilateral decreased breath sounds at bases Abdominal: Soft, NT, ND, bowel sounds + Extremities: trace edema, no cyanosis    The results of significant diagnostics from this hospitalization (including imaging, microbiology, ancillary and laboratory) are listed below for reference.     Microbiology: Recent Results (from the past  240 hour(s))  MRSA PCR Screening     Status: None   Collection Time: 08/23/17  6:59 AM  Result Value Ref Range Status   MRSA by PCR NEGATIVE NEGATIVE Final    Comment:        The GeneXpert MRSA Assay (FDA approved for NASAL specimens only), is one component of a comprehensive MRSA colonization surveillance program. It is not intended to diagnose MRSA infection nor to guide or monitor treatment for MRSA infections.      Labs: BNP (last 3 results)  Recent Labs  02/19/17 0912 07/06/17 1715 08/23/17 0220  BNP 21.6 50.1 48.1   Basic Metabolic Panel:  Recent Labs Lab 08/23/17 0220 08/24/17 0441 08/25/17 0445 08/26/17 0605  NA 139 136 138 139  K 3.9 4.4 4.4 3.7  CL 105 99* 101 98*  CO2 23 25 28 30   GLUCOSE 164* 318* 245* 104*  BUN 8 24* 28* 31*  CREATININE 1.20* 1.34* 1.22* 1.24*  CALCIUM 9.2 9.5 9.2 9.3  MG  --  2.0 2.4 2.4   Liver Function Tests: No results for input(s): AST, ALT, ALKPHOS, BILITOT, PROT, ALBUMIN in the last 168 hours. No results for input(s): LIPASE, AMYLASE in the last 168 hours. No results for input(s): AMMONIA in the last 168 hours. CBC:  Recent Labs Lab 08/23/17 0220 08/24/17 0441 08/25/17 0445 08/26/17 0605  WBC 8.0 11.5* 16.1* 13.4*  NEUTROABS 7.0 10.3* 13.9* 8.0*  HGB 11.1* 11.6* 12.1 13.0  HCT 33.3* 34.7* 36.4 38.6  MCV 82.2 82.0 82.2 82.1  PLT 275 310 335 341   Cardiac Enzymes:  Recent Labs Lab 08/23/17 0220  TROPONINI <0.03   BNP: Invalid input(s): POCBNP CBG:  Recent Labs Lab 08/25/17 1124 08/25/17 1619 08/25/17 2130 08/26/17 0750 08/26/17 [place]  148* 190* 177* 101* 139*   D-Dimer No results for input(s): DDIMER in the last 72 hours. Hgb A1c  Recent Labs  08/25/17 0445  HGBA1C 6.6*   Lipid Profile No results for input(s): CHOL, HDL, LDLCALC, TRIG, CHOLHDL, LDLDIRECT in the last 72 hours. Thyroid function studies No results for input(s): TSH, T4TOTAL, T3FREE, THYROIDAB in the last 72  hours.  Invalid input(s): FREET3 Anemia work up No results for input(s): VITAMINB12, FOLATE, FERRITIN, TIBC, IRON, RETICCTPCT in the last 72 hours. Urinalysis    Component Value Date/Time   COLORURINE YELLOW 03/01/2017 1152   APPEARANCEUR CLEAR 03/01/2017 1152   LABSPEC >1.046 (H) 03/01/2017 1152   PHURINE 5.0 03/01/2017 1152   GLUCOSEU NEGATIVE 03/01/2017 1152   HGBUR SMALL (A) 03/01/2017 1152   BILIRUBINUR NEGATIVE 03/01/2017 1152   KETONESUR NEGATIVE 03/01/2017 1152   PROTEINUR NEGATIVE 03/01/2017 1152   UROBILINOGEN 0.2 03/31/2015 1231   NITRITE NEGATIVE 03/01/2017 1152   LEUKOCYTESUR NEGATIVE 03/01/2017 1152   Sepsis Labs Invalid input(s): PROCALCITONIN,  WBC,  LACTICIDVEN Microbiology Recent Results (from the past 240 hour(s))  MRSA PCR Screening     Status: None   Collection Time: 08/23/17  6:59 AM  Result Value Ref Range Status   MRSA by PCR NEGATIVE NEGATIVE Final    Comment:        The GeneXpert MRSA Assay (FDA approved for NASAL specimens only), is one component of a comprehensive MRSA colonization surveillance program. It is not intended to diagnose MRSA infection nor to guide or monitor treatment for MRSA infections.      Time coordinating discharge: 35 minutes  SIGNED:   Aline August, MD  [facility] 08/26/2017, 2:00 PM Pager: [phone]  If 7PM-7AM, please contact night-coverage [email or web address] Password [email or web address]

## 2017-08-26 NOTE — Progress Notes (Signed)
Patient is alert and oriented, vital signs are stable, discharge instructions reviewed with patient, questions and concerns answered, patient to follow up with MD, portable O2 provided, Neta Mends RN 3:11 PM

## 2017-08-26 NOTE — Clinical Social Work Note (Signed)
Patient does not have anyone that can take her home today. She is unable to afford a cab home. Patient is on oxygen and has a large oxygen tank to take home. Discussed with Surveyor, quantity of social work. Address confirmed and cab voucher filled out, given to RN.  CSW signing off.  Dayton Scrape, [place]

## 2017-09-04 ENCOUNTER — Other Ambulatory Visit: Payer: Self-pay | Admitting: Internal Medicine

## 2017-09-04 DIAGNOSIS — N644 Mastodynia: Secondary | ICD-10-CM

## 2017-09-10 ENCOUNTER — Ambulatory Visit
Admission: RE | Admit: 2017-09-10 | Discharge: 2017-09-10 | Disposition: A | Discharge status: Home / Self Care | Payer: Medicaid Other | Source: Ambulatory Visit | Attending: Internal Medicine | Admitting: Internal Medicine

## 2017-09-10 DIAGNOSIS — N644 Mastodynia: Secondary | ICD-10-CM

## 2017-09-18 ENCOUNTER — Ambulatory Visit: Payer: Medicaid Other | Admitting: Cardiology

## 2017-09-18 ENCOUNTER — Encounter: Payer: Self-pay | Admitting: Cardiology

## 2017-09-18 ENCOUNTER — Other Ambulatory Visit: Payer: Self-pay

## 2017-09-18 VITALS — BP 116/60 | HR 79 | Ht 60.0 in | Wt 220.8 lb

## 2017-09-18 DIAGNOSIS — Z79899 Other long term (current) drug therapy: Secondary | ICD-10-CM

## 2017-09-18 MED ORDER — RANITIDINE HCL 75 MG PO TABS: 75.0000 mg | ORAL_TABLET | Freq: Two times a day (BID) | ORAL | 5 refills | Status: DC

## 2017-09-18 NOTE — Patient Instructions (Signed)
Medication Instructions: Your physician has recommended you make the following change in your medication:  -1) START Ranitidine (Zantac) 75 mg - Take 1 tablet by mouth twice daily  Labwork: Your physician recommends that you have lab work today: BMET  Procedures/Testing: None Ordered  Follow-Up: Your physician wants you to follow-up in: 6 MONTHS with Dr. Radford Pax. You will receive a reminder letter in the mail two months in advance. If you don't receive a letter, please call our office to schedule the follow-up appointment.   Any Additional Special Instructions Will Be Listed Below (If Applicable).  Please Monitor your weight gain at home. If you gain more then 3 lbs in day or 5 lbs in a week please call our office.    DASH Eating Plan DASH stands for "Dietary Approaches to Stop Hypertension." The DASH eating plan is a healthy eating plan that has been shown to reduce high blood pressure (hypertension). It may also reduce your risk for type 2 diabetes, heart disease, and stroke. The DASH eating plan may also help with weight loss. What are tips for following this plan? General guidelines  Avoid eating more than 2,000 mg (milligrams) of salt (sodium) a day. If you have hypertension, you may need to reduce your sodium intake to 1,500 mg a day.  Limit alcohol intake to no more than 1 drink a day for nonpregnant women and 2 drinks a day for men. One drink equals 12 oz of beer, 5 oz of wine, or 1 oz of hard liquor.  Work with your health care provider to maintain a healthy body weight or to lose weight. Ask what an ideal weight is for you.  Get at least 30 minutes of exercise that causes your heart to beat faster (aerobic exercise) most days of the week. Activities may include walking, swimming, or biking.  Work with your health care provider or diet and nutrition specialist (dietitian) to adjust your eating plan to your individual calorie needs. Reading food labels  Check food labels  for the amount of sodium per serving. Choose foods with less than 5 percent of the Daily Value of sodium. Generally, foods with less than 300 mg of sodium per serving fit into this eating plan.  To find whole grains, look for the word "whole" as the first word in the ingredient list. Shopping  Buy products labeled as "low-sodium" or "no salt added."  Buy fresh foods. Avoid canned foods and premade or frozen meals. Cooking  Avoid adding salt when cooking. Use salt-free seasonings or herbs instead of table salt or sea salt. Check with your health care provider or pharmacist before using salt substitutes.  Do not fry foods. Cook foods using healthy methods such as baking, boiling, grilling, and broiling instead.  Cook with heart-healthy oils, such as olive, canola, soybean, or sunflower oil. Meal planning   Eat a balanced diet that includes: ? 5 or more servings of fruits and vegetables each day. At each meal, try to fill half of your plate with fruits and vegetables. ? Up to 6-8 servings of whole grains each day. ? Less than 6 oz of lean meat, poultry, or fish each day. A 3-oz serving of meat is about the same size as a deck of cards. One egg equals 1 oz. ? 2 servings of low-fat dairy each day. ? A serving of nuts, seeds, or beans 5 times each week. ? Heart-healthy fats. Healthy fats called Omega-3 fatty acids are found in foods such as flaxseeds and  coldwater fish, like sardines, salmon, and mackerel.  Limit how much you eat of the following: ? Canned or prepackaged foods. ? Food that is high in trans fat, such as fried foods. ? Food that is high in saturated fat, such as fatty meat. ? Sweets, desserts, sugary drinks, and other foods with added sugar. ? Full-fat dairy products.  Do not salt foods before eating.  Try to eat at least 2 vegetarian meals each week.  Eat more home-cooked food and less restaurant, buffet, and fast food.  When eating at a restaurant, ask that your food  be prepared with less salt or no salt, if possible. What foods are recommended? The items listed may not be a complete list. Talk with your dietitian about what dietary choices are best for you. Grains Whole-grain or whole-wheat bread. Whole-grain or whole-wheat pasta. Brown rice. [place]. Bulgur. Whole-grain and low-sodium cereals. Pita bread. Low-fat, low-sodium crackers. Whole-wheat flour tortillas. Vegetables Fresh or frozen vegetables (raw, steamed, roasted, or grilled). Low-sodium or reduced-sodium tomato and vegetable juice. Low-sodium or reduced-sodium tomato sauce and tomato paste. Low-sodium or reduced-sodium canned vegetables. Fruits All fresh, dried, or frozen fruit. Canned fruit in natural juice (without added sugar). Meat and other protein foods Skinless chicken or [place]. Ground chicken or [place]. Pork with fat trimmed off. Fish and seafood. Egg whites. Dried beans, peas, or lentils. Unsalted nuts, nut butters, and seeds. Unsalted canned beans. Lean cuts of beef with fat trimmed off. Low-sodium, lean deli meat. Dairy Low-fat (1%) or fat-free (skim) milk. Fat-free, low-fat, or reduced-fat cheeses. Nonfat, low-sodium ricotta or cottage cheese. Low-fat or nonfat yogurt. Low-fat, low-sodium cheese. Fats and oils Soft margarine without trans fats. Vegetable oil. Low-fat, reduced-fat, or light mayonnaise and salad dressings (reduced-sodium). Canola, safflower, olive, soybean, and sunflower oils. Avocado. Seasoning and other foods Herbs. Spices. Seasoning mixes without salt. Unsalted popcorn and pretzels. Fat-free sweets. What foods are not recommended? The items listed may not be a complete list. Talk with your dietitian about what dietary choices are best for you. Grains Baked goods made with fat, such as croissants, muffins, or some breads. Dry pasta or rice meal packs. Vegetables Creamed or fried vegetables. Vegetables in a cheese sauce. Regular canned vegetables (not  low-sodium or reduced-sodium). Regular canned tomato sauce and paste (not low-sodium or reduced-sodium). Regular tomato and vegetable juice (not low-sodium or reduced-sodium). Angie Fava. Olives. Fruits Canned fruit in a light or heavy syrup. Fried fruit. Fruit in cream or butter sauce. Meat and other protein foods Fatty cuts of meat. Ribs. Fried meat. Berniece Salines. Sausage. Bologna and other processed lunch meats. Salami. Fatback. Hotdogs. Bratwurst. Salted nuts and seeds. Canned beans with added salt. Canned or smoked fish. Whole eggs or egg yolks. Chicken or [place] with skin. Dairy Whole or 2% milk, cream, and half-and-half. Whole or full-fat cream cheese. Whole-fat or sweetened yogurt. Full-fat cheese. Nondairy creamers. Whipped toppings. Processed cheese and cheese spreads. Fats and oils Butter. Stick margarine. Lard. Shortening. Ghee. Bacon fat. Tropical oils, such as coconut, palm kernel, or palm oil. Seasoning and other foods Salted popcorn and pretzels. Onion salt, garlic salt, seasoned salt, table salt, and sea salt. Worcestershire sauce. Tartar sauce. Barbecue sauce. Teriyaki sauce. Soy sauce, including reduced-sodium. Steak sauce. Canned and packaged gravies. Fish sauce. Oyster sauce. Cocktail sauce. Horseradish that you find on the shelf. Ketchup. Mustard. Meat flavorings and tenderizers. Bouillon cubes. Hot sauce and Tabasco sauce. Premade or packaged marinades. Premade or packaged taco seasonings. Relishes. Regular salad dressings. Where to find  more information:  National Heart, Lung, and Blood Institute: https://wilson-eaton.com/  American Heart Association: www.heart.org Summary  The DASH eating plan is a healthy eating plan that has been shown to reduce high blood pressure (hypertension). It may also reduce your risk for type 2 diabetes, heart disease, and stroke.  With the DASH eating plan, you should limit salt (sodium) intake to 2,000 mg a day. If you have hypertension, you may need to reduce  your sodium intake to 1,500 mg a day.  When on the DASH eating plan, aim to eat more fresh fruits and vegetables, whole grains, lean proteins, low-fat dairy, and heart-healthy fats.  Work with your health care provider or diet and nutrition specialist (dietitian) to adjust your eating plan to your individual calorie needs. This information is not intended to replace advice given to you by your health care provider. Make sure you discuss any questions you have with your health care provider. Document Released: 10/18/2011 Document Revised: 10/22/2016 Document Reviewed: 10/22/2016 Elsevier Interactive Patient Education  2017 [employer].    If you need a refill on your cardiac medications before your next appointment, please call your pharmacy.

## 2017-09-18 NOTE — Progress Notes (Signed)
09/18/2017 Stephanie Schultz   [birth year]  [id number]  Primary Physician [name], MD Primary Cardiologist: Dr. Radford Pax   Reason for Visit/CC: Post hospital f/u for CAP and acute on chronic diastolic CHF  HPI:  [name] is a 61 y/o AAF, followed by Dr. Radford Pax. Her PMH is significant for DM2, HTN, hyperlipidemia, COPD with chronic respiratory failure on chronic O2, OSA on CPAP, prior tobacco abuse, chronic diastolic CHF and morbid obesity. She had a cath in 2009 showing normal coronary arteries and normal LVF. She had recurrent CP and underwent repeat LHC 12/03/16 with normal coronaries, EF 55-65%, normal LVEDP. Based on symptoms, increased O2 requirements and increased sputum production, she was felt to have an acute COPD exacerbation and was treated with improvement in symptoms.   She is here today for post hospital f/u. She was admitted 08/2017 for acute hypoxic respiratory failure in the setting of CAP and acute on chronic diastolic HF. She was placed on antibiotics and treated with IV lasix. Per d/c summary, she diuresed 4L. She was transitioned back to PO Lasix, 40 mg daily and advised to f/u in our office for repeat assessment and f/u BMP.  She reports that she is better. Breathing back to baseline. She remains on chronic O2. No weight gain or LEE. She complains of symptoms of reflux. She has chest tightness/ indigestion after meals and frequent bleaching, which improves symptoms. She reports full compliance with Protonix. She tried Nexium in the past but no relief. She is not on a H2 blocker. BP and HR stable.   Current Meds  Medication Sig  . albuterol (PROVENTIL) (2.5 MG/3ML) 0.083% nebulizer solution Take 2.5 mg by nebulization 4 (four) times daily as needed for wheezing or shortness of breath.   . allopurinol (ZYLOPRIM) 100 MG tablet Take 100 mg by mouth daily.  Marland Kitchen ALPRAZolam (XANAX) 1 MG tablet Take 1 mg by mouth at bedtime as needed for sleep.   Marland Kitchen aspirin EC 81 MG  tablet Take 81 mg by mouth daily.  . budesonide-formoterol (SYMBICORT) 160-4.5 MCG/ACT inhaler Inhale 2 puffs into the lungs 2 (two) times daily.  . Cholecalciferol (VITAMIN D PO) Take 1 tablet by mouth daily.  . clobetasol cream (TEMOVATE) 1.85 % Apply 1 application topically daily as needed (irritation from pull ups).   Marland Kitchen dextromethorphan (DELSYM) 30 MG/5ML liquid Take 5 mLs (30 mg total) by mouth 2 (two) times daily.  . fluticasone (FLONASE) 50 MCG/ACT nasal spray Place 1 spray into both nostrils daily as needed (congestion).  . furosemide (LASIX) 40 MG tablet Take 1 tablet (40 mg total) by mouth daily.  Marland Kitchen glipiZIDE (GLUCOTROL) 10 MG tablet Take 10 mg by mouth 2 (two) times daily before a meal.   . guaiFENesin (MUCINEX) 600 MG 12 hr tablet Take 1 tablet (600 mg total) by mouth 2 (two) times daily.  Marland Kitchen HYDROcodone-acetaminophen (NORCO) 7.5-325 MG tablet Take 1 tablet by mouth every 6 (six) hours as needed for moderate pain.  Marland Kitchen insulin glargine (LANTUS) 100 unit/mL SOPN Inject 18 Units into the skin at bedtime.   . Linaclotide (LINZESS) 145 MCG CAPS capsule Take 145 mcg by mouth daily.   Marland Kitchen loratadine (CLARITIN) 10 MG tablet Take 10 mg by mouth daily.  . metoprolol succinate (TOPROL-XL) 25 MG 24 hr tablet Take 2 tablets (50 mg total) by mouth daily.  . Naphazoline HCl (CLEAR EYES OP) Place 1 drop into both eyes daily.  . ondansetron (ZOFRAN ODT) 4 MG disintegrating tablet Take  1 tablet (4 mg total) by mouth every 8 (eight) hours as needed for nausea or vomiting.  . OXYGEN Inhale 1-2 L into the lungs continuous. 1 L at night and 2 L during the day  . pantoprazole (PROTONIX) 40 MG tablet Take 1 tablet (40 mg total) by mouth daily.  . Potassium Chloride ER 20 MEQ TBCR Take 20 mEq by mouth daily.  . pravastatin (PRAVACHOL) 20 MG tablet Take 20 mg by mouth daily.   Marland Kitchen PROAIR HFA 108 (90 Base) MCG/ACT inhaler INHALE 2 PUFFS BY MOUTH EVERY 4 HOURS AS NEEDED FOR WHEEZING  . sitaGLIPtin (JANUVIA) 100 MG  tablet Take 100 mg by mouth daily.  . sodium chloride (OCEAN) 0.65 % SOLN nasal spray Place 1 spray into both nostrils daily as needed for congestion.   Marland Kitchen spironolactone (ALDACTONE) 25 MG tablet Take 0.5 tablets (12.5 mg total) by mouth daily.  Marland Kitchen tiotropium (SPIRIVA) 18 MCG inhalation capsule Place 1 capsule (18 mcg total) into inhaler and inhale daily.  Marland Kitchen zolpidem (AMBIEN) 10 MG tablet Take 10 mg by mouth at bedtime.   No Known Allergies Past Medical History:  Diagnosis Date  . Anemia    chronis  . Anxiety   . Arthritis   . Cervical cancer ([place]) 1990   cervical   . Chronic diastolic CHF (congestive heart failure) ([place]) 08/10/2016  . COPD (chronic obstructive pulmonary disease) ([place])    O2 dependent. Pulmo: [name]  . Diabetes mellitus without complication ([place])    a. A1c 8.3 in 11/2015  . Emphysema   . Gallstones    s/p cholecystectomy  . GERD (gastroesophageal reflux disease)   . Gout   . History of home oxygen therapy    "2.5L; 24/7" (08/10/2016)  . Hx of cardiac catheterization    a. LHC at [facility] in [place], [place] 09/2008:  Normal coronary arteries EF 70%. b. LHC 11/2016: normal cors, normal LVEDP, EF 55-65%.  . Hyperlipidemia   . Hypertension   . Morbid obesity ([place])   . OSA on CPAP    CPAP at night   . Pneumonia   . Sickle cell trait ([place])   . Supplemental oxygen dependent    2L CONTINUOUSLEY  . Tobacco abuse    a. up to 3ppd from age 36 to 7, now 1/4 ppd (01/2013) >> Quit 10/2015   Family History  Problem Relation Age of Onset  . Other Father        unaware of father's medical history  . Diabetes Mother        alive @ 90  . Myasthenia gravis Mother   . Lung cancer Paternal Aunt   . Lung cancer Paternal Grandfather   . Other Unknown        multiple siblings a&w.  . Heart attack Neg Hx   . Heart failure Neg Hx    Past Surgical History:  Procedure Laterality Date  . BREAST BIOPSY    . CARDIAC CATHETERIZATION    . TUBAL LIGATION     Social History    Socioeconomic History  . Marital status: Divorced    Spouse name: Not on file  . Number of children: 3  . Years of education: 11th  . Highest education level: Not on file  Social Needs  . Financial resource strain: Not on file  . Food insecurity - worry: Not on file  . Food insecurity - inability: Not on file  . Transportation needs - medical: Not on file  . Transportation  needs - non-medical: Not on file  Occupational History  . Occupation: Disabled    Comment: Emphysema  Tobacco Use  . Smoking status: Former Smoker    Packs/day: 0.50    Years: 36.00    Pack years: 18.00    Types: Cigarettes    Last attempt to quit: 11/11/2015    Years since quitting: 1.8  . Smokeless tobacco: Never Used  . Tobacco comment: Approx 90 pk-yrs (up to 3ppd until ~ 2009). Smoking 3 cigs per day now.  Substance and Sexual Activity  . Alcohol use: No    Alcohol/week: 0.0 oz  . Drug use: No  . Sexual activity: Yes  Other Topics Concern  . Not on file  Social History Narrative   From [place], [place].  Moved to [place] about 8 years ago to be closer to her daughter. She moved back to [place] about a year and a half ago. She lives by herself. She does not routinely exercise or adhere to any particular diet.   Caffeine Use: 1-2 cups daily   Disabled; Previously in home care     Review of Systems: General: negative for chills, fever, night sweats or weight changes.  Cardiovascular: negative for chest pain, dyspnea on exertion, edema, orthopnea, palpitations, paroxysmal nocturnal dyspnea or shortness of breath Dermatological: negative for rash Respiratory: negative for cough or wheezing Urologic: negative for hematuria Abdominal: negative for nausea, vomiting, diarrhea, bright red blood per rectum, melena, or hematemesis Neurologic: negative for visual changes, syncope, or dizziness All other systems reviewed and are otherwise negative except as noted above.   Physical Exam:  Blood  pressure 116/60, pulse 79, height 5' (1.524 m), weight 220 lb 12.8 oz (100.2 kg), last menstrual period 06/04/2000, SpO2 94 %.  General appearance: alert, cooperative and no distress Neck: no carotid bruit and no JVD Lungs: clear to auscultation bilaterally Heart: regular rate and rhythm, S1, S2 normal, no murmur, click, rub or gallop Extremities: extremities normal, atraumatic, no cyanosis or edema Pulses: 2+ and symmetric Skin: Skin color, texture, turgor normal. No rashes or lesions Neurologic: Grossly normal  EKG not performed -- personally reviewed   ASSESSMENT AND PLAN:   1. Chronic Diastolic HF: recent hospital admission w/ acute exacerbation. Diuresed 4L with IV Lasix. Transitioned back to PO at 40 mg daily. Stable in clinic today. Breathing back to baseline. She appears euvolemic. She has been monitoring weight closely at home, which has remained stable. Continue daily lasix + low sodium diet and daily weights.  We will recheck a BMP today to insure renal function and K are stable.   2. COPD: on chronic home O2. Stable. Followed by pulmonology.  3. HTN: controlled on current regimen.   4. GERD: pt with frequent symptoms of indigestion w/ meals and belching. She is on Protonix. Recommend addition of H2 blocker at night  Follow-Up w/ Dr. Radford Pax in 6 months.   Brittainy Ladoris Gene, MHS [facility] 09/18/2017 2:48 PM

## 2017-09-19 LAB — BASIC METABOLIC PANEL
BUN/Creatinine Ratio: 8 — ABNORMAL LOW (ref 9–23)
BUN: 9 mg/dL (ref 6–24)
CHLORIDE: 102 mmol/L (ref 96–106)
CO2: 29 mmol/L (ref 20–29)
CREATININE: 1.11 mg/dL — AB (ref 0.57–1.00)
Calcium: 9.7 mg/dL (ref 8.7–10.2)
GFR calc Af Amer: 63 mL/min/{1.73_m2} (ref >59)
GFR calc non Af Amer: 54 mL/min/{1.73_m2} — ABNORMAL LOW (ref >59)
GLUCOSE: 78 mg/dL (ref 65–99)
Potassium: 3.9 mmol/L (ref 3.5–5.2)
Sodium: 145 mmol/L — ABNORMAL HIGH (ref 134–144)

## 2017-09-24 ENCOUNTER — Encounter: Payer: Self-pay | Admitting: Emergency Medicine

## 2017-09-24 ENCOUNTER — Ambulatory Visit: Payer: Medicaid Other | Admitting: Emergency Medicine

## 2017-09-24 DIAGNOSIS — I5033 Acute on chronic diastolic (congestive) heart failure: Secondary | ICD-10-CM | POA: Diagnosis not present

## 2017-09-24 DIAGNOSIS — J441 Chronic obstructive pulmonary disease with (acute) exacerbation: Secondary | ICD-10-CM

## 2017-09-24 DIAGNOSIS — Z23 Encounter for immunization: Secondary | ICD-10-CM | POA: Diagnosis not present

## 2017-09-24 DIAGNOSIS — G4733 Obstructive sleep apnea (adult) (pediatric): Secondary | ICD-10-CM

## 2017-09-24 DIAGNOSIS — J9611 Chronic respiratory failure with hypoxia: Secondary | ICD-10-CM

## 2017-09-24 DIAGNOSIS — J309 Allergic rhinitis, unspecified: Secondary | ICD-10-CM | POA: Diagnosis not present

## 2017-09-24 NOTE — Patient Instructions (Addendum)
Take an extra lasix tablet tonight and tomorrow. Then go back to once a day.  Continue your Spiriva and Symbcort as you are taking them  Only use albuterol (either inhaler or nebulizer) up to every 4 hours if needed for shortness of breath, wheezing. Prevnar 13 today Flu shot up-to-date Continue oxygen at 2.5-3 L/min during the day, 1 L/min at night while sleeping. We will obtain copies of your sleep study from earlier this year Follow with Dr Lamonte Sakai in 4 months or sooner if you have any problems.

## 2017-09-24 NOTE — Progress Notes (Signed)
  Subjective:    Patient ID: Stephanie Schultz, female    DOB: 1956-05-11, 61 y.o.   MRN: [id number]  ROV 01/09/16 -- patient has a history of COPD with a positive bronchodilator response, upper airway irritation syndrome and chronic cough. She also has sleep apnea and is on CPAP. She was last seen in our office by TP for an acute exacerbation. She is currently managed on Symbicort and Spiriva. Supplemental oxygen at 2.5 L/m. She is doing well > . She is using saline washes, she is off nasal spray due to epistaxis, on loratadine. She has stayed off cigarettes since her GB surgery. Her breathing has clearly benefited. She is taking spiriva and symbicort reliably. She is wearing CPAP, but states that she has a HA daily.   ROV 09/24/17 --this is a follow-up visit for patient with a history of severe COPD with a positive bronchodilator response, chronic cough due to this and upper airway irritation syndrome.  She also has obstructive sleep apnea, hypertension with diastolic dysfunction.  I have not seen her for almost 2 years - she has followed with Dr Camillo Flaming at [facility].  She was hospitalized mid October with possible pneumonia versus cardiogenic pulmonary edema. She has improved since the hospitalization, completed a pred taper. Her dyspnea seems to correlate with her wt and fluid status. She ate Lays potato chips yesterday and has gained 3 lbs, feels more SOB. She is on 2.5L/min at rest, up to 3l/min w walking. She is using Symbicort and Spiriva. She uses albuterol more than 4 x a day on many days. She is on loratadine, flonase only prn. She had a PSG in April 2018 that showed she could come off CPAP - no longer using.   ROS  See history of present illness   Objective:   Physical Exam Vitals:   09/24/17 1408  BP: 120/64  Pulse: 81  SpO2: 92%  Weight: 223 lb (101.2 kg)  Height: 5' (1.524 m)    Gen: Obese, in no distress,  normal affect  ENT: No lesions,  mouth clear,  oropharynx clear, no postnasal  drip, hoarse voice  Neck: No JVD, no TMG, no carotid bruits, loud exp stridor and hoarse voice  Lungs: No use of accessory muscles, distant, no wheezes   Cardiovascular: RRR, heart sounds normal, no murmur or gallops, no peripheral edema  Musculoskeletal: No deformities, no cyanosis or clubbing  Neuro: alert, non focal  Skin: Warm, no lesions or rashes, old scars on B UE's   Assessment & Plan:   No problem-specific Assessment & Plan notes found for this encounter.

## 2017-09-24 NOTE — Assessment & Plan Note (Signed)
Significant COPD and multifactorial dyspnea.  No evidence of bronchospasm or exacerbation today.  Continue Spiriva, Symbicort, albuterol as needed.  I did instruct her on appropriate albuterol use as it appears she was using it more frequently than every 4 hours.

## 2017-09-24 NOTE — Assessment & Plan Note (Addendum)
3 pounds weight gain and more dyspnea after a salt load.  She does have trace lower extremity edema.  I will ask her to take Lasix extra dose for the next 2 days then go back to 40 mg daily.  Advised her about her salt intake

## 2017-09-24 NOTE — Assessment & Plan Note (Signed)
Continue oxygen at 2.5-3 L/min, 1 L/min at night while sleeping.

## 2017-09-24 NOTE — Assessment & Plan Note (Signed)
Continue current loratadine

## 2017-09-24 NOTE — Assessment & Plan Note (Signed)
Per review of notes from Dr Camillo Flaming she had a sleep study in 2018 that did not show clinically significant obstructive sleep apnea.  For this reason her CPAP has been stopped.  She uses 1 L/min at night.

## 2017-09-24 NOTE — Addendum Note (Signed)
Addended by: Desmond Dike C on: 09/24/2017 02:43 PM   Modules accepted: Orders

## 2017-10-13 ENCOUNTER — Emergency Department (HOSPITAL_COMMUNITY)
Admission: EM | Admit: 2017-10-13 | Discharge: 2017-10-13 | Disposition: A | Discharge status: Home / Self Care | Payer: Medicaid Other | Attending: Emergency Medicine | Admitting: Emergency Medicine

## 2017-10-13 ENCOUNTER — Emergency Department (HOSPITAL_COMMUNITY): Payer: Medicaid Other

## 2017-10-13 ENCOUNTER — Encounter (HOSPITAL_COMMUNITY): Payer: Self-pay | Admitting: Emergency Medicine

## 2017-10-13 DIAGNOSIS — E1140 Type 2 diabetes mellitus with diabetic neuropathy, unspecified: Secondary | ICD-10-CM | POA: Insufficient documentation

## 2017-10-13 DIAGNOSIS — I5033 Acute on chronic diastolic (congestive) heart failure: Secondary | ICD-10-CM | POA: Insufficient documentation

## 2017-10-13 DIAGNOSIS — I110 Hypertensive heart disease with heart failure: Secondary | ICD-10-CM | POA: Insufficient documentation

## 2017-10-13 DIAGNOSIS — Z7982 Long term (current) use of aspirin: Secondary | ICD-10-CM | POA: Diagnosis not present

## 2017-10-13 DIAGNOSIS — J449 Chronic obstructive pulmonary disease, unspecified: Secondary | ICD-10-CM | POA: Insufficient documentation

## 2017-10-13 DIAGNOSIS — Z87891 Personal history of nicotine dependence: Secondary | ICD-10-CM | POA: Diagnosis not present

## 2017-10-13 DIAGNOSIS — Z79899 Other long term (current) drug therapy: Secondary | ICD-10-CM | POA: Insufficient documentation

## 2017-10-13 DIAGNOSIS — Z794 Long term (current) use of insulin: Secondary | ICD-10-CM | POA: Insufficient documentation

## 2017-10-13 DIAGNOSIS — K2900 Acute gastritis without bleeding: Secondary | ICD-10-CM | POA: Diagnosis not present

## 2017-10-13 DIAGNOSIS — R112 Nausea with vomiting, unspecified: Secondary | ICD-10-CM | POA: Diagnosis present

## 2017-10-13 LAB — COMPREHENSIVE METABOLIC PANEL WITH GFR
ALT: 26 U/L (ref 14–54)
AST: 31 U/L (ref 15–41)
Albumin: 4 g/dL (ref 3.5–5.0)
Alkaline Phosphatase: 39 U/L (ref 38–126)
Anion gap: 8 (ref 5–15)
BUN: 16 mg/dL (ref 6–20)
CO2: 27 mmol/L (ref 22–32)
Calcium: 9.5 mg/dL (ref 8.9–10.3)
Chloride: 107 mmol/L (ref 101–111)
Creatinine, Ser: 1.28 mg/dL — ABNORMAL HIGH (ref 0.44–1.00)
GFR calc Af Amer: 52 mL/min — ABNORMAL LOW
GFR calc non Af Amer: 45 mL/min — ABNORMAL LOW
Glucose, Bld: 119 mg/dL — ABNORMAL HIGH (ref 65–99)
Potassium: 3.9 mmol/L (ref 3.5–5.1)
Sodium: 142 mmol/L (ref 135–145)
Total Bilirubin: 0.6 mg/dL (ref 0.3–1.2)
Total Protein: 7.9 g/dL (ref 6.5–8.1)

## 2017-10-13 LAB — CBC
HCT: 39.5 % (ref 36.0–46.0)
HEMOGLOBIN: 13.1 g/dL (ref 12.0–15.0)
MCH: 27.9 pg (ref 26.0–34.0)
MCHC: 33.2 g/dL (ref 30.0–36.0)
MCV: 84 fL (ref 78.0–100.0)
PLATELETS: 219 10*3/uL (ref 150–400)
RBC: 4.7 MIL/uL (ref 3.87–5.11)
RDW: 14.6 % (ref 11.5–15.5)
WBC: 13.5 10*3/uL — AB (ref 4.0–10.5)

## 2017-10-13 LAB — URINALYSIS, ROUTINE W REFLEX MICROSCOPIC
BILIRUBIN URINE: NEGATIVE
Glucose, UA: NEGATIVE mg/dL
HGB URINE DIPSTICK: NEGATIVE
Ketones, ur: NEGATIVE mg/dL
LEUKOCYTES UA: NEGATIVE
NITRITE: NEGATIVE
PH: 5 (ref 5.0–8.0)
Protein, ur: 30 mg/dL — AB
SPECIFIC GRAVITY, URINE: 1.029 (ref 1.005–1.030)

## 2017-10-13 LAB — LIPASE, BLOOD: LIPASE: 21 U/L (ref 11–51)

## 2017-10-13 MED ORDER — ONDANSETRON HCL 4 MG/2ML IJ SOLN
4.0000 mg | Freq: Once | INTRAMUSCULAR | Status: AC
  Administered 2017-10-13: 4 mg via INTRAVENOUS
  Filled 2017-10-13: qty 2

## 2017-10-13 MED ORDER — FENTANYL CITRATE (PF) 100 MCG/2ML IJ SOLN
50.0000 ug | Freq: Once | INTRAMUSCULAR | Status: AC
  Administered 2017-10-13: 50 ug via INTRAVENOUS
  Filled 2017-10-13: qty 2

## 2017-10-13 MED ORDER — ALBUTEROL SULFATE (2.5 MG/3ML) 0.083% IN NEBU
5.0000 mg | INHALATION_SOLUTION | Freq: Once | RESPIRATORY_TRACT | Status: AC
  Administered 2017-10-13: 5 mg via RESPIRATORY_TRACT
  Filled 2017-10-13: qty 6

## 2017-10-13 MED ORDER — GI COCKTAIL ~~LOC~~
30.0000 mL | Freq: Once | ORAL | Status: AC
  Administered 2017-10-13: 30 mL via ORAL
  Filled 2017-10-13: qty 30

## 2017-10-13 MED ORDER — PANTOPRAZOLE SODIUM 40 MG IV SOLR
40.0000 mg | Freq: Once | INTRAVENOUS | Status: AC
  Administered 2017-10-13: 40 mg via INTRAVENOUS
  Filled 2017-10-13: qty 40

## 2017-10-13 NOTE — ED Provider Notes (Signed)
Stephanie Schultz   CSN: [id number] Arrival date & time: 10/13/17  1601     History   Chief Complaint Chief Complaint  Patient presents with  . Abdominal Pain    HPI Stephanie Schultz is a 61 y.o. female.  Patient is a 61 year old female with a history of COPD, diabetes, hypertension, hyperlipidemia, morbid obesity and gallstones status post cholecystectomy who presents with abdominal pain.  She is on home oxygen at 2-3 L/min.  She reports a 1 day history of nausea and vomiting.  She has some chronic diarrhea which she says is unchanged from her baseline but she started having some nausea and vomiting this morning.  She states it smells like feces.  She has some pain to her upper abdomen which also started this morning.  She does have a history of gastritis in the past.  No history of pancreatitis.  No fevers although she is having some chills.  She denies any urinary symptoms.  She has a dry cough which is unchanged from her baseline.  She has not taken any medications today for the symptoms.      Past Medical History:  Diagnosis Date  . Anemia    chronis  . Anxiety   . Arthritis   . Cervical cancer ([place]) 1990   cervical   . Chronic diastolic CHF (congestive heart failure) ([place]) 08/10/2016  . COPD (chronic obstructive pulmonary disease) ([place])    O2 dependent. Pulmo: [name]  . Diabetes mellitus without complication ([place])    a. A1c 8.3 in 11/2015  . Emphysema   . Gallstones    s/p cholecystectomy  . GERD (gastroesophageal reflux disease)   . Gout   . History of home oxygen therapy    "2.5L; 24/7" (08/10/2016)  . Hx of cardiac catheterization    a. LHC at [facility] in [place], [place] 09/2008:  Normal coronary arteries EF 70%. b. LHC 11/2016: normal cors, normal LVEDP, EF 55-65%.  . Hyperlipidemia   . Hypertension   . Morbid obesity ([place])   . OSA on CPAP    CPAP at night   . Pneumonia   . Sickle cell trait ([place])   .  Supplemental oxygen dependent    2L CONTINUOUSLEY  . Tobacco abuse    a. up to 3ppd from age 19 to 34, now 1/4 ppd (01/2013) >> Quit 10/2015    Patient Active Problem List   Diagnosis Date Noted  . Acute respiratory distress 08/23/2017  . Acute on chronic diastolic CHF (congestive heart failure) ([place]) 08/10/2016  . Acute respiratory failure ([place]) 07/20/2016  . S/P laparoscopic cholecystectomy 12/13/2015  . Chronic respiratory failure ([place]) 03/31/2015  . Rib fracture 03/31/2015  . Acute respiratory failure with hypoxia ([place]) 02/17/2015  . Arthritis 12/13/2014  . Gout 03/03/2014  . Diabetic neuropathy ([place]) 03/03/2014  . Carpal tunnel syndrome 03/03/2014  . DM type 2 (diabetes mellitus, type 2) ([place]) 05/22/2013  . Anxiety 05/22/2013  . Transaminasemia 05/09/2013  . Leukocytosis 04/27/2013  . Tobacco abuse   . Chest pain   . Sickle cell trait ([place])   . Morbid obesity ([place])   . Acute on chronic respiratory failure ([place]) 01/16/2013  . Diastolic dysfunction 78/24/2353  . Hoarseness 12/22/2012  . Epistaxis, recurrent 07/08/2012  . GERD (gastroesophageal reflux disease) 09/20/2011  . Pulmonary nodule 09/20/2011  . COPD (chronic obstructive pulmonary disease) ([place]) 04/04/2011  . Diabetes mellitus type 2, controlled ([place]) 04/04/2011  . Hypertension 04/04/2011  .  Obstructive sleep apnea 04/04/2011  . Chronic allergic rhinitis 04/04/2011    Past Surgical History:  Procedure Laterality Date  . BREAST BIOPSY    . CARDIAC CATHETERIZATION    . CARDIAC CATHETERIZATION N/A 12/03/2016   Procedure: Left Heart Cath and Coronary Angiography;  Surgeon: Peter M Martinique, MD;  Location: [facility];  Service: Cardiovascular;  Laterality: N/A;  . CHOLECYSTECTOMY N/A 12/13/2015   Procedure: LAPAROSCOPIC CHOLECYSTECTOMY;  Surgeon: Ralene Ok, MD;  Location: [facility];  Service: General;  Laterality: N/A;  . COLONOSCOPY  09/05/2012   Procedure: COLONOSCOPY;  Surgeon: Beryle Beams, MD;   Location: [facility];  Service: Endoscopy;  Laterality: N/A;  . COLONOSCOPY WITH PROPOFOL N/A 09/02/2015   Procedure: COLONOSCOPY WITH PROPOFOL;  Surgeon: Carol Ada, MD;  Location: [facility];  Service: Endoscopy;  Laterality: N/A;  . ESOPHAGOGASTRODUODENOSCOPY (EGD) WITH PROPOFOL N/A 03/29/2017   Procedure: ESOPHAGOGASTRODUODENOSCOPY (EGD) WITH PROPOFOL;  Surgeon: Carol Ada, MD;  Location: [facility];  Service: Endoscopy;  Laterality: N/A;  . TUBAL LIGATION      OB History    No data available       Home Medications    Prior to Admission medications   Medication Sig Start Date End Date Taking? Authorizing Provider  albuterol (PROVENTIL) (2.5 MG/3ML) 0.083% nebulizer solution Take 2.5 mg by nebulization 4 (four) times daily as needed for wheezing or shortness of breath.     [provider]  allopurinol (ZYLOPRIM) 100 MG tablet Take 100 mg by mouth daily.    [provider]  ALPRAZolam Duanne Moron) 1 MG tablet Take 1 mg by mouth at bedtime as needed for sleep.  04/13/16   [provider]  aspirin EC 81 MG tablet Take 81 mg by mouth daily.    [provider]  budesonide-formoterol (SYMBICORT) 160-4.5 MCG/ACT inhaler Inhale 2 puffs into the lungs 2 (two) times daily. 05/03/15   Collene Gobble, MD  Cholecalciferol (VITAMIN D PO) Take 1 tablet by mouth daily.    [provider]  clobetasol cream (TEMOVATE) 1.75 % Apply 1 application topically daily as needed (irritation from pull ups).  03/25/17   [provider]  fluticasone (FLONASE) 50 MCG/ACT nasal spray Place 1 spray into both nostrils daily as needed (congestion). 08/26/17   Aline August, MD  furosemide (LASIX) 40 MG tablet Take 1 tablet (40 mg total) by mouth daily. 12/05/16   Arbutus Leas, NP  glipiZIDE (GLUCOTROL) 10 MG tablet Take 10 mg by mouth 2 (two) times daily before a meal.     [provider]  guaiFENesin (MUCINEX) 600 MG 12 hr tablet Take 1 tablet (600 mg  total) by mouth 2 (two) times daily. 08/26/17   Aline August, MD  HYDROcodone-acetaminophen (NORCO) 7.5-325 MG tablet Take 1 tablet by mouth every 6 (six) hours as needed for moderate pain.    [provider]  insulin glargine (LANTUS) 100 unit/mL SOPN Inject 18 Units into the skin at bedtime.     [provider]  Linaclotide Rolan Lipa) 145 MCG CAPS capsule Take 145 mcg by mouth daily.     [provider]  loratadine (CLARITIN) 10 MG tablet Take 10 mg by mouth daily.    [provider]  metoprolol succinate (TOPROL-XL) 25 MG 24 hr tablet Take 2 tablets (50 mg total) by mouth daily. 08/26/17   Aline August, MD  Naphazoline HCl (CLEAR EYES OP) Place 1 drop into both eyes daily.    [provider]  ondansetron (ZOFRAN ODT) 4 MG disintegrating tablet Take 1 tablet (4 mg total) by mouth every 8 (eight) hours as needed for nausea or vomiting. 03/01/17   Blanchie Dessert, MD  OXYGEN Inhale 1-2 L into the lungs continuous. 1 L at night and 2 L during the day    [provider]  pantoprazole (PROTONIX) 40 MG tablet Take 1 tablet (40 mg total) by mouth daily. 08/26/17   Aline August, MD  Potassium Chloride ER 20 MEQ TBCR Take 20 mEq by mouth daily. 12/04/16   Arbutus Leas, NP  pravastatin (PRAVACHOL) 20 MG tablet Take 20 mg by mouth daily.     [provider]  PROAIR HFA 108 (90 Base) MCG/ACT inhaler INHALE 2 PUFFS BY MOUTH EVERY 4 HOURS AS NEEDED FOR WHEEZING 06/14/16   Byrum, Rose Fillers, MD  ranitidine (ZANTAC 75) 75 MG tablet Take 1 tablet (75 mg total) 2 (two) times daily by mouth. 09/18/17   Lyda Jester M, PA-C  sitaGLIPtin (JANUVIA) 100 MG tablet Take 100 mg by mouth daily.    [provider]  sodium chloride (OCEAN) 0.65 % SOLN nasal spray Place 1 spray into both nostrils daily as needed for congestion.     [provider]  spironolactone (ALDACTONE) 25 MG tablet Take 0.5 tablets (12.5 mg total) by mouth daily.  12/05/16   Arbutus Leas, NP  tiotropium (SPIRIVA) 18 MCG inhalation capsule Place 1 capsule (18 mcg total) into inhaler and inhale daily. 05/03/15   Collene Gobble, MD  zolpidem (AMBIEN) 10 MG tablet Take 10 mg by mouth at bedtime.    [provider]    Family History Family History  Problem Relation Age of Onset  . Other Father        unaware of father's medical history  . Diabetes Mother        alive @ 7  . Myasthenia gravis Mother   . Lung cancer Paternal Aunt   . Lung cancer Paternal Grandfather   . Other Unknown        multiple siblings a&w.  . Heart attack Neg Hx   . Heart failure Neg Hx     Social History Social History   Tobacco Use  . Smoking status: Former Smoker    Packs/day: 0.50    Years: 36.00    Pack years: 18.00    Types: Cigarettes    Last attempt to quit: 11/11/2015    Years since quitting: 1.9  . Smokeless tobacco: Never Used  . Tobacco comment: Approx 90 pk-yrs (up to 3ppd until ~ 2009). Smoking 3 cigs per day now.  Substance Use Topics  . Alcohol use: No    Alcohol/week: 0.0 oz  . Drug use: No     Allergies   Patient has no known allergies.   Review of Systems Review of Systems  Constitutional: Positive for chills. Negative for diaphoresis, fatigue and fever.  HENT: Negative for congestion, rhinorrhea and sneezing.   Eyes: Negative.   Respiratory: Negative for cough, chest tightness and shortness of breath.   Cardiovascular: Negative for chest pain and leg swelling.  Gastrointestinal: Positive for abdominal pain, diarrhea, nausea and vomiting. Negative for blood in stool.  Genitourinary: Negative for difficulty urinating, flank pain, frequency and hematuria.  Musculoskeletal: Negative for arthralgias and back pain.  Skin: Negative for rash.  Neurological: Negative for dizziness, speech difficulty, weakness, numbness and headaches.     Physical Exam Updated Vital Signs BP 119/60   Pulse 95  Temp 97.9 F (36.6 C) (Oral)    Resp 18   Ht 5' (1.524 m)   Wt 99.8 kg (220 lb)   LMP 06/04/2000 (LMP Unknown)   SpO2 91%   BMI 42.97 kg/m   Physical Exam  Constitutional: She is oriented to person, place, and time. She appears well-developed and well-nourished.  HENT:  Head: Normocephalic and atraumatic.  Eyes: Pupils are equal, round, and reactive to light.  Neck: Normal range of motion. Neck supple.  Cardiovascular: Normal rate, regular rhythm and normal heart sounds.  Pulmonary/Chest: Effort normal and breath sounds normal. No respiratory distress. She has no wheezes. She has no rales. She exhibits no tenderness.  Abdominal: Soft. Bowel sounds are normal. There is tenderness in the epigastric area. There is no rebound and no guarding.  Musculoskeletal: Normal range of motion. She exhibits no edema.  Lymphadenopathy:    She has no cervical adenopathy.  Neurological: She is alert and oriented to person, place, and time.  Skin: Skin is warm and dry. No rash noted.  Psychiatric: She has a normal mood and affect.     ED Treatments / Results  Labs (all labs ordered are listed, but only abnormal results are displayed) Labs Reviewed  COMPREHENSIVE METABOLIC PANEL - Abnormal; Notable for the following components:      Result Value   Glucose, Bld 119 (*)    Creatinine, Ser 1.28 (*)    GFR calc non Af Amer 45 (*)    GFR calc Af Amer 52 (*)    All other components within normal limits  CBC - Abnormal; Notable for the following components:   WBC 13.5 (*)    All other components within normal limits  URINALYSIS, ROUTINE W REFLEX MICROSCOPIC - Abnormal; Notable for the following components:   Protein, ur 30 (*)    Bacteria, UA FEW (*)    Squamous Epithelial / LPF 0-5 (*)    All other components within normal limits  LIPASE, BLOOD    EKG  EKG Interpretation None       Radiology Dg Abd Acute W/chest  Result Date: 10/13/2017 CLINICAL DATA:  Abdominal pain for 1 week.  nausea and vomiting. EXAM: DG  ABDOMEN ACUTE W/ 1V CHEST COMPARISON:  Chest radiograph on 08/24/2017 FINDINGS: Gaseous distention of stomach noted. There is no evidence of dilated bowel loops or free intraperitoneal air. No radiopaque calculi or other significant radiographic abnormality is seen. Heart size and mediastinal contours are within normal limits. Both lungs are clear. Several old left rib fracture deformities again noted. IMPRESSION: Gaseous distention of stomach.  No evidence of dilated bowel loops. No active cardiopulmonary disease. Electronically Signed   By: Earle Gell M.D.   On: 10/13/2017 18:23    Procedures Procedures (including critical care time)  Medications Ordered in ED Medications  albuterol (PROVENTIL) (2.5 MG/3ML) 0.083% nebulizer solution 5 mg (5 mg Nebulization Given 10/13/17 1639)  ondansetron (ZOFRAN) injection 4 mg (4 mg Intravenous Given 10/13/17 1639)  pantoprazole (PROTONIX) injection 40 mg (40 mg Intravenous Given 10/13/17 1640)  fentaNYL (SUBLIMAZE) injection 50 mcg (50 mcg Intravenous Given 10/13/17 1640)  gi cocktail (Maalox,Lidocaine,Donnatal) (30 mLs Oral Given 10/13/17 1958)     Initial Impression / Assessment and Plan / ED Course  I have reviewed the triage vital signs and the nursing notes.  Pertinent labs & imaging results that were available during my care of the patient were reviewed by me and considered in my medical decision making (see chart for details).  Patient presents with vomiting.  She has chronic diarrhea which is unchanged.  Her emesis is nonbloody.  There is no evidence of bowel obstruction on imaging.  She was treated with antiemetics and acid reducing medications.  She is feeling much better.  Her abdominal pain has resolved.  There is no tenderness on palpation.  Her labs are non-concerning.  There is no evidence of pancreatitis.  She is status post cholecystectomy.  She is tolerating oral fluids.  She was discharged home in good condition.  She has omeprazole and  Zantac which she uses at home.  She also has antiemetics at home.  I encouraged her to have follow-up with her PCP.  Return precautions were given.  Final Clinical Impressions(s) / ED Diagnoses   Final diagnoses:  Acute gastritis without hemorrhage, unspecified gastritis type    ED Discharge Orders    None       Malvin Johns, MD 10/13/17 2112

## 2017-10-13 NOTE — ED Notes (Signed)
Pt given Ginger ale.

## 2017-10-13 NOTE — ED Notes (Signed)
Patient transported to X-ray 

## 2017-10-13 NOTE — ED Triage Notes (Signed)
Per EMS: Pt coming home. C/o of Abd pain x 1 week. NV x 1 day. Pt report vomit is brown and smells like feces. Pt having watery stool every day. A&Ox4/ 100 NS and 4 of Zofran from EMS.

## 2017-11-01 ENCOUNTER — Encounter: Payer: Self-pay | Admitting: Podiatry

## 2017-11-01 ENCOUNTER — Ambulatory Visit: Payer: Medicaid Other | Admitting: Podiatry

## 2017-11-01 DIAGNOSIS — B351 Tinea unguium: Secondary | ICD-10-CM | POA: Diagnosis not present

## 2017-11-01 DIAGNOSIS — E119 Type 2 diabetes mellitus without complications: Secondary | ICD-10-CM | POA: Diagnosis not present

## 2017-11-01 NOTE — Progress Notes (Signed)
Subjective:     Patient ID: Stephanie Schultz, female   DOB: [birth year], 61 y.o.   MRN: [id number]  HPI this patient presents to the office with chief complaint of a severely painful ingrowing toenail left foot.  She is also concerned about her discolored nail right big toe.  She presents for preventative foot care services.   Review of Systems     Objective:   Physical Exam GENERAL APPEARANCE: Alert, conversant. Appropriately groomed. No acute distress.  VASCULAR: Pedal pulses are  palpable at  [facility] and PT bilateral.  Capillary refill time is immediate to all digits,  Normal temperature gradient.  Digital hair growth is present bilateral  NEUROLOGIC: sensation is normal to 5.07 monofilament at 5/5 sites bilateral.  Light touch is intact bilateral, Muscle strength normal.  MUSCULOSKELETAL: acceptable muscle strength, tone and stability bilateral.  Intrinsic muscluature intact bilateral.  Rectus appearance of foot and digits noted bilateral.   DERMATOLOGIC: skin color, texture, and turgor are within normal limits.  No preulcerative lesions or ulcers  are seen, no interdigital maceration noted.  No open lesions present.   NAILS  Marked incurvation lateral border left great toenail..  Thick disfigured discolored nails  B/L   .     Assessment:     Onychomycosis  B/L    Plan:     Debridement of nails x 10.  RTC 3 months   Gardiner Barefoot DPM      Gardiner Barefoot DPM

## 2017-11-19 ENCOUNTER — Emergency Department (HOSPITAL_COMMUNITY)
Admission: EM | Admit: 2017-11-19 | Discharge: 2017-11-19 | Disposition: A | Discharge status: Home / Self Care | Payer: Medicaid Other | Attending: Emergency Medicine | Admitting: Emergency Medicine

## 2017-11-19 ENCOUNTER — Encounter (HOSPITAL_COMMUNITY): Payer: Self-pay | Admitting: Emergency Medicine

## 2017-11-19 ENCOUNTER — Emergency Department (HOSPITAL_COMMUNITY): Payer: Medicaid Other

## 2017-11-19 DIAGNOSIS — I110 Hypertensive heart disease with heart failure: Secondary | ICD-10-CM | POA: Insufficient documentation

## 2017-11-19 DIAGNOSIS — Z87891 Personal history of nicotine dependence: Secondary | ICD-10-CM | POA: Diagnosis not present

## 2017-11-19 DIAGNOSIS — Z7982 Long term (current) use of aspirin: Secondary | ICD-10-CM | POA: Insufficient documentation

## 2017-11-19 DIAGNOSIS — Z79899 Other long term (current) drug therapy: Secondary | ICD-10-CM | POA: Diagnosis not present

## 2017-11-19 DIAGNOSIS — E119 Type 2 diabetes mellitus without complications: Secondary | ICD-10-CM | POA: Insufficient documentation

## 2017-11-19 DIAGNOSIS — R0602 Shortness of breath: Secondary | ICD-10-CM | POA: Diagnosis present

## 2017-11-19 DIAGNOSIS — Z8541 Personal history of malignant neoplasm of cervix uteri: Secondary | ICD-10-CM | POA: Diagnosis not present

## 2017-11-19 DIAGNOSIS — J441 Chronic obstructive pulmonary disease with (acute) exacerbation: Secondary | ICD-10-CM

## 2017-11-19 DIAGNOSIS — I5032 Chronic diastolic (congestive) heart failure: Secondary | ICD-10-CM | POA: Insufficient documentation

## 2017-11-19 LAB — BASIC METABOLIC PANEL
ANION GAP: 8 (ref 5–15)
BUN: 12 mg/dL (ref 6–20)
CO2: 29 mmol/L (ref 22–32)
CREATININE: 1.09 mg/dL — AB (ref 0.44–1.00)
Calcium: 9.3 mg/dL (ref 8.9–10.3)
Chloride: 100 mmol/L — ABNORMAL LOW (ref 101–111)
GFR calc non Af Amer: 54 mL/min — ABNORMAL LOW (ref >60)
Glucose, Bld: 125 mg/dL — ABNORMAL HIGH (ref 65–99)
Potassium: 3.6 mmol/L (ref 3.5–5.1)
SODIUM: 137 mmol/L (ref 135–145)

## 2017-11-19 LAB — CBC
HCT: 35.5 % — ABNORMAL LOW (ref 36.0–46.0)
HEMOGLOBIN: 11.6 g/dL — AB (ref 12.0–15.0)
MCH: 26.7 pg (ref 26.0–34.0)
MCHC: 32.7 g/dL (ref 30.0–36.0)
MCV: 81.6 fL (ref 78.0–100.0)
PLATELETS: 252 10*3/uL (ref 150–400)
RBC: 4.35 MIL/uL (ref 3.87–5.11)
RDW: 14.9 % (ref 11.5–15.5)
WBC: 7.1 10*3/uL (ref 4.0–10.5)

## 2017-11-19 LAB — I-STAT TROPONIN, ED: Troponin i, poc: 0.01 ng/mL (ref 0.00–0.08)

## 2017-11-19 LAB — BRAIN NATRIURETIC PEPTIDE: B NATRIURETIC PEPTIDE 5: 9.4 pg/mL (ref 0.0–100.0)

## 2017-11-19 MED ORDER — ALBUTEROL (5 MG/ML) CONTINUOUS INHALATION SOLN
10.0000 mg/h | INHALATION_SOLUTION | Freq: Once | RESPIRATORY_TRACT | Status: AC
  Administered 2017-11-19: 10 mg/h via RESPIRATORY_TRACT
  Filled 2017-11-19: qty 20

## 2017-11-19 MED ORDER — PREDNISONE 20 MG PO TABS
40.0000 mg | ORAL_TABLET | Freq: Once | ORAL | Status: AC
  Administered 2017-11-19: 40 mg via ORAL
  Filled 2017-11-19: qty 2

## 2017-11-19 MED ORDER — PREDNISONE 20 MG PO TABS: ORAL_TABLET | ORAL | 0 refills | Status: DC

## 2017-11-19 MED ORDER — AZITHROMYCIN 250 MG PO TABS: 250.0000 mg | ORAL_TABLET | Freq: Every day | ORAL | 0 refills | Status: DC

## 2017-11-19 MED ORDER — AZITHROMYCIN 250 MG PO TABS
500.0000 mg | ORAL_TABLET | Freq: Once | ORAL | Status: AC
Start: 2017-11-19 — End: 2017-11-19
  Administered 2017-11-19: 500 mg via ORAL
  Filled 2017-11-19: qty 2

## 2017-11-19 NOTE — Discharge Instructions (Signed)
Use your albuterol nebulizer or inhaler every 4 hours as needed for cough or shortness of breath.  Return if needed more than every 4 hours or see Dr. Jonelle Sidle.  Or you can return if concern for any reason.  Take the medications prescribed starting tomorrow

## 2017-11-19 NOTE — ED Notes (Signed)
ED Provider at bedside. 

## 2017-11-19 NOTE — ED Provider Notes (Signed)
[facility] Provider Note   CSN: [id number] Arrival date & time: 11/19/17  1015     History   Chief Complaint Chief Complaint  Patient presents with  . Shortness of Breath    HPI Stephanie Schultz is a 62 y.o. female.  HPI Planes of shortness of breath worse with exertion and improved with rest onset 4 days ago accompanied by cough productive of yellowish sputum.  She is treated herself with albuterol, without relief.  Other associated symptoms include subjective fever.  No nausea.  No other associated symptoms.  She feels as if "fluid is in my lungs" Past Medical History:  Diagnosis Date  . Anemia    chronis  . Anxiety   . Arthritis   . Cervical cancer ([place]) 1990   cervical   . Chronic diastolic CHF (congestive heart failure) ([place]) 08/10/2016  . COPD (chronic obstructive pulmonary disease) ([place])    O2 dependent. Pulmo: [name]  . Diabetes mellitus without complication ([place])    a. A1c 8.3 in 11/2015  . Emphysema   . Gallstones    s/p cholecystectomy  . GERD (gastroesophageal reflux disease)   . Gout   . History of home oxygen therapy    "2.5L; 24/7" (08/10/2016)  . Hx of cardiac catheterization    a. LHC at [facility] in [place], [place] 09/2008:  Normal coronary arteries EF 70%. b. LHC 11/2016: normal cors, normal LVEDP, EF 55-65%.  . Hyperlipidemia   . Hypertension   . Morbid obesity ([place])   . OSA on CPAP    CPAP at night   . Pneumonia   . Sickle cell trait ([place])   . Supplemental oxygen dependent    2L CONTINUOUSLEY  . Tobacco abuse    a. up to 3ppd from age 44 to 66, now 1/4 ppd (01/2013) >> Quit 10/2015    Patient Active Problem List   Diagnosis Date Noted  . Acute respiratory distress 08/23/2017  . Acute on chronic diastolic CHF (congestive heart failure) ([place]) 08/10/2016  . Acute respiratory failure ([place]) 07/20/2016  . S/P laparoscopic cholecystectomy 12/13/2015  . Chronic respiratory failure ([place]) 03/31/2015  . Rib  fracture 03/31/2015  . Acute respiratory failure with hypoxia ([place]) 02/17/2015  . Arthritis 12/13/2014  . Gout 03/03/2014  . Diabetic neuropathy ([place]) 03/03/2014  . Carpal tunnel syndrome 03/03/2014  . DM type 2 (diabetes mellitus, type 2) ([place]) 05/22/2013  . Anxiety 05/22/2013  . Transaminasemia 05/09/2013  . Leukocytosis 04/27/2013  . Tobacco abuse   . Chest pain   . Sickle cell trait ([place])   . Morbid obesity ([place])   . Acute on chronic respiratory failure ([place]) 01/16/2013  . Diastolic dysfunction 71/04/2693  . Hoarseness 12/22/2012  . Epistaxis, recurrent 07/08/2012  . GERD (gastroesophageal reflux disease) 09/20/2011  . Pulmonary nodule 09/20/2011  . COPD (chronic obstructive pulmonary disease) ([place]) 04/04/2011  . Diabetes mellitus type 2, controlled ([place]) 04/04/2011  . Hypertension 04/04/2011  . Obstructive sleep apnea 04/04/2011  . Chronic allergic rhinitis 04/04/2011    Past Surgical History:  Procedure Laterality Date  . BREAST BIOPSY    . CARDIAC CATHETERIZATION    . CARDIAC CATHETERIZATION N/A 12/03/2016   Procedure: Left Heart Cath and Coronary Angiography;  Surgeon: Peter M Martinique, MD;  Location: [facility];  Service: Cardiovascular;  Laterality: N/A;  . CHOLECYSTECTOMY N/A 12/13/2015   Procedure: LAPAROSCOPIC CHOLECYSTECTOMY;  Surgeon: Ralene Ok, MD;  Location: [facility];  Service: General;  Laterality: N/A;  .  COLONOSCOPY  09/05/2012   Procedure: COLONOSCOPY;  Surgeon: Beryle Beams, MD;  Location: [facility];  Service: Endoscopy;  Laterality: N/A;  . COLONOSCOPY WITH PROPOFOL N/A 09/02/2015   Procedure: COLONOSCOPY WITH PROPOFOL;  Surgeon: Carol Ada, MD;  Location: [facility];  Service: Endoscopy;  Laterality: N/A;  . ESOPHAGOGASTRODUODENOSCOPY (EGD) WITH PROPOFOL N/A 03/29/2017   Procedure: ESOPHAGOGASTRODUODENOSCOPY (EGD) WITH PROPOFOL;  Surgeon: Carol Ada, MD;  Location: [facility];  Service: Endoscopy;  Laterality: N/A;  . TUBAL  LIGATION      OB History    No data available       Home Medications    Prior to Admission medications   Medication Sig Start Date End Date Taking? Authorizing Provider  albuterol (PROVENTIL) (2.5 MG/3ML) 0.083% nebulizer solution Take 2.5 mg by nebulization 4 (four) times daily as needed for wheezing or shortness of breath.     [provider]  allopurinol (ZYLOPRIM) 100 MG tablet Take 100 mg by mouth daily.    [provider]  ALPRAZolam Duanne Moron) 1 MG tablet Take 1 mg by mouth at bedtime as needed for sleep.  04/13/16   [provider]  aspirin EC 81 MG tablet Take 81 mg by mouth daily.    [provider]  budesonide-formoterol (SYMBICORT) 160-4.5 MCG/ACT inhaler Inhale 2 puffs into the lungs 2 (two) times daily. 05/03/15   Collene Gobble, MD  Cholecalciferol (VITAMIN D PO) Take 1 tablet by mouth daily.    [provider]  clobetasol cream (TEMOVATE) 2.02 % Apply 1 application topically daily as needed (irritation from pull ups).  03/25/17   [provider]  fluticasone (FLONASE) 50 MCG/ACT nasal spray Place 1 spray into both nostrils daily as needed (congestion). 08/26/17   Aline August, MD  furosemide (LASIX) 40 MG tablet Take 1 tablet (40 mg total) by mouth daily. 12/05/16   Arbutus Leas, NP  glipiZIDE (GLUCOTROL) 10 MG tablet Take 10 mg by mouth 2 (two) times daily before a meal.     [provider]  guaiFENesin (MUCINEX) 600 MG 12 hr tablet Take 1 tablet (600 mg total) by mouth 2 (two) times daily. 08/26/17   Aline August, MD  HYDROcodone-acetaminophen (NORCO) 7.5-325 MG tablet Take 1 tablet by mouth every 6 (six) hours as needed for moderate pain.    [provider]  insulin glargine (LANTUS) 100 unit/mL SOPN Inject 18 Units into the skin at bedtime.     [provider]  Linaclotide Rolan Lipa) 145 MCG CAPS capsule Take 145 mcg by mouth daily.     [provider]  loratadine (CLARITIN) 10 MG  tablet Take 10 mg by mouth daily.    [provider]  metoprolol succinate (TOPROL-XL) 25 MG 24 hr tablet Take 2 tablets (50 mg total) by mouth daily. 08/26/17   Aline August, MD  Naphazoline HCl (CLEAR EYES OP) Place 1 drop into both eyes daily.    [provider]  ondansetron (ZOFRAN ODT) 4 MG disintegrating tablet Take 1 tablet (4 mg total) by mouth every 8 (eight) hours as needed for nausea or vomiting. 03/01/17   Blanchie Dessert, MD  OXYGEN Inhale 1-2 L into the lungs continuous. 1 L at night and 2 L during the day    [provider]  pantoprazole (PROTONIX) 40 MG tablet Take 1 tablet (40 mg total) by mouth daily. 08/26/17   Aline August, MD  Potassium Chloride ER 20 MEQ TBCR Take 20 mEq by mouth daily. 12/04/16  Arbutus Leas, NP  pravastatin (PRAVACHOL) 20 MG tablet Take 20 mg by mouth daily.     [provider]  PROAIR HFA 108 (90 Base) MCG/ACT inhaler INHALE 2 PUFFS BY MOUTH EVERY 4 HOURS AS NEEDED FOR WHEEZING 06/14/16   Byrum, Rose Fillers, MD  ranitidine (ZANTAC 75) 75 MG tablet Take 1 tablet (75 mg total) 2 (two) times daily by mouth. 09/18/17   Lyda Jester M, PA-C  sitaGLIPtin (JANUVIA) 100 MG tablet Take 100 mg by mouth daily.    [provider]  sodium chloride (OCEAN) 0.65 % SOLN nasal spray Place 1 spray into both nostrils daily as needed for congestion.     [provider]  spironolactone (ALDACTONE) 25 MG tablet Take 0.5 tablets (12.5 mg total) by mouth daily. 12/05/16   Arbutus Leas, NP  tiotropium (SPIRIVA) 18 MCG inhalation capsule Place 1 capsule (18 mcg total) into inhaler and inhale daily. 05/03/15   Collene Gobble, MD  zolpidem (AMBIEN) 10 MG tablet Take 10 mg by mouth at bedtime.    [provider]    Family History Family History  Problem Relation Age of Onset  . Other Father        unaware of father's medical history  . Diabetes Mother        alive @ 72  . Myasthenia gravis Mother   . Lung  cancer Paternal Aunt   . Lung cancer Paternal Grandfather   . Other Unknown        multiple siblings a&w.  . Heart attack Neg Hx   . Heart failure Neg Hx     Social History Social History   Tobacco Use  . Smoking status: Former Smoker    Packs/day: 0.50    Years: 36.00    Pack years: 18.00    Types: Cigarettes    Last attempt to quit: 11/11/2015    Years since quitting: 2.0  . Smokeless tobacco: Never Used  . Tobacco comment: Approx 90 pk-yrs (up to 3ppd until ~ 2009). Smoking 3 cigs per day now.  Substance Use Topics  . Alcohol use: No    Alcohol/week: 0.0 oz  . Drug use: No     Allergies   Patient has no known allergies.   Review of Systems Review of Systems  Constitutional: Positive for fever.       Subbjective fever  HENT: Negative.   Respiratory: Positive for cough and shortness of breath.   Cardiovascular: Negative.   Gastrointestinal: Negative.   Musculoskeletal: Negative.   Skin: Negative.   Allergic/Immunologic: Positive for immunocompromised state.       Diabetic  Neurological: Negative.   Psychiatric/Behavioral: Negative.   All other systems reviewed and are negative.    Physical Exam Updated Vital Signs BP 93/67   Pulse 66   Temp 98.1 F (36.7 C) (Oral)   Resp 14   LMP 06/04/2000 (LMP Unknown)   SpO2 97%   Physical Exam  Constitutional: She appears well-developed and well-nourished. No distress.  HENT:  Head: Normocephalic and atraumatic.  Eyes: Conjunctivae are normal. Pupils are equal, round, and reactive to light.  Neck: Neck supple. No tracheal deviation present. No thyromegaly present.  Cardiovascular: Normal rate and regular rhythm.  No murmur heard. Pulmonary/Chest: Effort normal and breath sounds normal.  speaks in paragraphs.  No respiratory distress  Abdominal: Soft. Bowel sounds are normal. She exhibits no distension. There is no tenderness.  Musculoskeletal: Normal range of motion. She exhibits no edema  or tenderness.    Neurological: She is alert. Coordination normal.  Skin: Skin is warm and dry. No rash noted.  Psychiatric: She has a normal mood and affect.  Nursing note and vitals reviewed.    ED Treatments / Results  Labs (all labs ordered are listed, but only abnormal results are displayed) Labs Reviewed  BASIC METABOLIC PANEL - Abnormal; Notable for the following components:      Result Value   Chloride 100 (*)    Glucose, Bld 125 (*)    Creatinine, Ser 1.09 (*)    GFR calc non Af Amer 54 (*)    All other components within normal limits  CBC - Abnormal; Notable for the following components:   Hemoglobin 11.6 (*)    HCT 35.5 (*)    All other components within normal limits  BRAIN NATRIURETIC PEPTIDE  I-STAT TROPONIN, ED    EKG  EKG Interpretation  Date/Time:  Tuesday November 19 2017 10:27:14 EST Ventricular Rate:  65 PR Interval:  154 QRS Duration: 82 QT Interval:  442 QTC Calculation: 459 R Axis:   67 Text Interpretation:  Normal sinus rhythm Normal ECG No significant change since last tracing Confirmed by Orlie Dakin [phone]) on 11/19/2017 1:27:10 PM       Radiology Dg Chest 2 View  Result Date: 11/19/2017 CLINICAL DATA:  Shortness of breath x4 days EXAM: CHEST  2 VIEW COMPARISON:  10/13/2017 FINDINGS: Mild lingular atelectasis. No focal consolidation. No pleural effusion or pneumothorax. The heart is normal in size. Degenerative changes of the visualized thoracolumbar spine. IMPRESSION: Normal chest radiographs. Electronically Signed   By: Julian Hy M.D.   On: 11/19/2017 11:40   chestX-ray viewed by me  Procedures Procedures (including critical care time)  Medications Ordered in ED Medications  albuterol (PROVENTIL,VENTOLIN) solution continuous neb (not administered)  predniSONE (DELTASONE) tablet 40 mg (40 mg Oral Given 11/19/17 1310)   Results for orders placed or performed during the hospital encounter of 02/77/41  Basic metabolic panel  Result Value Ref  Range   Sodium 137 135 - 145 mmol/L   Potassium 3.6 3.5 - 5.1 mmol/L   Chloride 100 (L) 101 - 111 mmol/L   CO2 29 22 - 32 mmol/L   Glucose, Bld 125 (H) 65 - 99 mg/dL   BUN 12 6 - 20 mg/dL   Creatinine, Ser 1.09 (H) 0.44 - 1.00 mg/dL   Calcium 9.3 8.9 - 10.3 mg/dL   GFR calc non Af Amer 54 (L) >60 mL/min   GFR calc Af Amer >60 >60 mL/min   Anion gap 8 5 - 15  CBC  Result Value Ref Range   WBC 7.1 4.0 - 10.5 K/uL   RBC 4.35 3.87 - 5.11 MIL/uL   Hemoglobin 11.6 (L) 12.0 - 15.0 g/dL   HCT 35.5 (L) 36.0 - 46.0 %   MCV 81.6 78.0 - 100.0 fL   MCH 26.7 26.0 - 34.0 pg   MCHC 32.7 30.0 - 36.0 g/dL   RDW 14.9 11.5 - 15.5 %   Platelets 252 150 - 400 K/uL  Brain natriuretic peptide  Result Value Ref Range   B Natriuretic Peptide 9.4 0.0 - 100.0 pg/mL  I-stat troponin, ED  Result Value Ref Range   Troponin i, poc 0.01 0.00 - 0.08 ng/mL   Comment 3           Dg Chest 2 View  Result Date: 11/19/2017 CLINICAL DATA:  Shortness of breath x4 days EXAM: CHEST  2  VIEW COMPARISON:  10/13/2017 FINDINGS: Mild lingular atelectasis. No focal consolidation. No pleural effusion or pneumothorax. The heart is normal in size. Degenerative changes of the visualized thoracolumbar spine. IMPRESSION: Normal chest radiographs. Electronically Signed   By: Julian Hy M.D.   On: 11/19/2017 11:40    Initial Impression / Assessment and Plan / ED Course  I have reviewed the triage vital signs and the nursing notes.  Pertinent labs & imaging results that were available during my care of the patient were reviewed by me and considered in my medical decision making (see chart for details).    3:35 PM after 1 hour of continuous nebulization and prednisone patient states she is breathing at baseline.  She is able to walk around the emergency department without dyspnea.  She feels ready to go home.  Patient reports that steroids and "Z-Pak" have helped her in the past.  She will receive Zithromax 500 mg orally prior  to discharge as well as prescription for azithromycin and prednisone.  Instructed to use albuterol nebulizer or inhaler every 4 hours.  Return if needed more than every 4 hours or see Dr. Jonelle Sidle  Final Clinical Impressions(s) / ED Diagnoses  Diagnoses #1 acute bronchitis #2 COPD exacerbation Final diagnoses:  None    ED Discharge Orders    None       Orlie Dakin, MD 11/19/17 1550

## 2017-11-19 NOTE — ED Triage Notes (Signed)
Pt arrives from home with c/o of shortness of breath that has been getting worse. Pt is on home o2 with chronic COPD and CHF. Pt also states her chest feels tight. 95% on 2 L [place]. Pt able to speak in complete sentences.

## 2017-11-19 NOTE — ED Notes (Addendum)
Ambulated pt. With 3L O2 down the hallway, physically walked down hallway with no assist, spo2 started at 94% in room, then initially went down to 88%, then continued to rise and hit 98% on trip back to room for few seconds. Pt. Felt good while walking and maintained 92-94% in room for few minutes after exertion.

## 2017-12-01 ENCOUNTER — Emergency Department (HOSPITAL_COMMUNITY)
Admission: EM | Admit: 2017-12-01 | Discharge: 2017-12-01 | Disposition: A | Discharge status: Home / Self Care | Payer: Medicaid Other | Attending: Emergency Medicine | Admitting: Emergency Medicine

## 2017-12-01 ENCOUNTER — Other Ambulatory Visit: Payer: Self-pay

## 2017-12-01 ENCOUNTER — Emergency Department (HOSPITAL_COMMUNITY): Payer: Medicaid Other

## 2017-12-01 ENCOUNTER — Encounter (HOSPITAL_COMMUNITY): Payer: Self-pay

## 2017-12-01 DIAGNOSIS — I5032 Chronic diastolic (congestive) heart failure: Secondary | ICD-10-CM | POA: Insufficient documentation

## 2017-12-01 DIAGNOSIS — Z7984 Long term (current) use of oral hypoglycemic drugs: Secondary | ICD-10-CM | POA: Diagnosis not present

## 2017-12-01 DIAGNOSIS — Z87891 Personal history of nicotine dependence: Secondary | ICD-10-CM | POA: Diagnosis not present

## 2017-12-01 DIAGNOSIS — R0602 Shortness of breath: Secondary | ICD-10-CM | POA: Diagnosis present

## 2017-12-01 DIAGNOSIS — Z7982 Long term (current) use of aspirin: Secondary | ICD-10-CM | POA: Insufficient documentation

## 2017-12-01 DIAGNOSIS — J441 Chronic obstructive pulmonary disease with (acute) exacerbation: Secondary | ICD-10-CM | POA: Insufficient documentation

## 2017-12-01 DIAGNOSIS — J069 Acute upper respiratory infection, unspecified: Secondary | ICD-10-CM | POA: Insufficient documentation

## 2017-12-01 DIAGNOSIS — Z79899 Other long term (current) drug therapy: Secondary | ICD-10-CM | POA: Insufficient documentation

## 2017-12-01 DIAGNOSIS — E1140 Type 2 diabetes mellitus with diabetic neuropathy, unspecified: Secondary | ICD-10-CM | POA: Diagnosis not present

## 2017-12-01 DIAGNOSIS — I110 Hypertensive heart disease with heart failure: Secondary | ICD-10-CM | POA: Diagnosis not present

## 2017-12-01 DIAGNOSIS — Z9981 Dependence on supplemental oxygen: Secondary | ICD-10-CM | POA: Diagnosis not present

## 2017-12-01 LAB — CBC WITH DIFFERENTIAL/PLATELET
Basophils Absolute: 0 10*3/uL (ref 0.0–0.1)
Basophils Relative: 0 %
Eosinophils Absolute: 0.2 10*3/uL (ref 0.0–0.7)
Eosinophils Relative: 3 %
HEMATOCRIT: 34.5 % — AB (ref 36.0–46.0)
HEMOGLOBIN: 11.8 g/dL — AB (ref 12.0–15.0)
LYMPHS ABS: 2 10*3/uL (ref 0.7–4.0)
LYMPHS PCT: 29 %
MCH: 27.7 pg (ref 26.0–34.0)
MCHC: 34.2 g/dL (ref 30.0–36.0)
MCV: 81 fL (ref 78.0–100.0)
MONOS PCT: 6 %
Monocytes Absolute: 0.4 10*3/uL (ref 0.1–1.0)
NEUTROS PCT: 62 %
Neutro Abs: 4.2 10*3/uL (ref 1.7–7.7)
Platelets: 215 10*3/uL (ref 150–400)
RBC: 4.26 MIL/uL (ref 3.87–5.11)
RDW: 15.2 % (ref 11.5–15.5)
WBC: 6.8 10*3/uL (ref 4.0–10.5)

## 2017-12-01 LAB — BASIC METABOLIC PANEL
Anion gap: 7 (ref 5–15)
BUN: 7 mg/dL (ref 6–20)
CHLORIDE: 101 mmol/L (ref 101–111)
CO2: 31 mmol/L (ref 22–32)
CREATININE: 0.98 mg/dL (ref 0.44–1.00)
Calcium: 9.4 mg/dL (ref 8.9–10.3)
GFR calc Af Amer: >60 mL/min (ref >60)
GFR calc non Af Amer: >60 mL/min (ref >60)
GLUCOSE: 105 mg/dL — AB (ref 65–99)
Potassium: 4.6 mmol/L (ref 3.5–5.1)
Sodium: 139 mmol/L (ref 135–145)

## 2017-12-01 LAB — I-STAT TROPONIN, ED: Troponin i, poc: 0 ng/mL (ref 0.00–0.08)

## 2017-12-01 LAB — BRAIN NATRIURETIC PEPTIDE: B Natriuretic Peptide: 12.9 pg/mL (ref 0.0–100.0)

## 2017-12-01 MED ORDER — IBUPROFEN 200 MG PO TABS
400.0000 mg | ORAL_TABLET | Freq: Once | ORAL | Status: AC
  Administered 2017-12-01: 400 mg via ORAL
  Filled 2017-12-01: qty 2

## 2017-12-01 MED ORDER — PREDNISONE 20 MG PO TABS: 40.0000 mg | ORAL_TABLET | Freq: Every day | ORAL | 0 refills | Status: DC

## 2017-12-01 MED ORDER — ALBUTEROL SULFATE (2.5 MG/3ML) 0.083% IN NEBU
5.0000 mg | INHALATION_SOLUTION | Freq: Once | RESPIRATORY_TRACT | Status: AC
  Administered 2017-12-01: 5 mg via RESPIRATORY_TRACT
  Filled 2017-12-01: qty 6

## 2017-12-01 MED ORDER — PREDNISONE 20 MG PO TABS
60.0000 mg | ORAL_TABLET | Freq: Once | ORAL | Status: AC
  Administered 2017-12-01: 60 mg via ORAL
  Filled 2017-12-01: qty 3

## 2017-12-01 MED ORDER — IPRATROPIUM-ALBUTEROL 0.5-2.5 (3) MG/3ML IN SOLN
3.0000 mL | Freq: Once | RESPIRATORY_TRACT | Status: AC
  Administered 2017-12-01: 3 mL via RESPIRATORY_TRACT
  Filled 2017-12-01: qty 3

## 2017-12-01 NOTE — Discharge Instructions (Signed)
Please continue Prednisone for the next 4 days Follow up with your doctor Return if worsening

## 2017-12-01 NOTE — ED Provider Notes (Signed)
[facility] DEPT Provider Note   CSN: [id number] Arrival date & time: 12/01/17  1109     History   Chief Complaint Chief Complaint  Patient presents with  . Shortness of Breath    HPI Stephanie Schultz is a 62 y.o. female who presents with SOB. PMHx significant for COPD on 2.5L O2 24/7, CHF EF 60-65%, DM, GERD, HTN, HLD. She states that she has had URI symptoms for the past three days. She started having nasal congestion and a headache and that progressively worsened to having SOB, productive cough, wheezing. Then she started developing lower chest/upper abdominal pain and back pain which she attributes to coughing. She is unsure if she's had a fever but has felt hot, had chills and sweats. She has had to sleep upright in a chair for a couple nights due to SOB. She has not had increased swelling in the legs. She has been adherent to her medicines. She reports a decreased appetite. She has been using home breathing tx but thinks she has been using them too much and her doctor told her they wouldn't work if she used them too much. She was on steroids a couple weeks ago for a similar issue. She is also concerned because she feels her nose is dry and she's had some bleeding. This has happened since using Flonase. She has had a flu and pneumonia shot.   HPI  Past Medical History:  Diagnosis Date  . Anemia    chronis  . Anxiety   . Arthritis   . Cervical cancer ([place]) 1990   cervical   . Chronic diastolic CHF (congestive heart failure) ([place]) 08/10/2016  . COPD (chronic obstructive pulmonary disease) ([place])    O2 dependent. Pulmo: [name]  . Diabetes mellitus without complication ([place])    a. A1c 8.3 in 11/2015  . Emphysema   . Gallstones    s/p cholecystectomy  . GERD (gastroesophageal reflux disease)   . Gout   . History of home oxygen therapy    "2.5L; 24/7" (08/10/2016)  . Hx of cardiac catheterization    a. LHC at [facility] in [place], [place] 09/2008:   Normal coronary arteries EF 70%. b. LHC 11/2016: normal cors, normal LVEDP, EF 55-65%.  . Hyperlipidemia   . Hypertension   . Morbid obesity ([place])   . OSA on CPAP    CPAP at night   . Pneumonia   . Sickle cell trait ([place])   . Supplemental oxygen dependent    2L CONTINUOUSLEY  . Tobacco abuse    a. up to 3ppd from age 89 to 33, now 1/4 ppd (01/2013) >> Quit 10/2015    Patient Active Problem List   Diagnosis Date Noted  . Acute respiratory distress 08/23/2017  . Acute on chronic diastolic CHF (congestive heart failure) ([place]) 08/10/2016  . Acute respiratory failure ([place]) 07/20/2016  . S/P laparoscopic cholecystectomy 12/13/2015  . Chronic respiratory failure ([place]) 03/31/2015  . Rib fracture 03/31/2015  . Acute respiratory failure with hypoxia ([place]) 02/17/2015  . Arthritis 12/13/2014  . Gout 03/03/2014  . Diabetic neuropathy ([place]) 03/03/2014  . Carpal tunnel syndrome 03/03/2014  . DM type 2 (diabetes mellitus, type 2) ([place]) 05/22/2013  . Anxiety 05/22/2013  . Transaminasemia 05/09/2013  . Leukocytosis 04/27/2013  . Tobacco abuse   . Chest pain   . Sickle cell trait ([place])   . Morbid obesity ([place])   . Acute on chronic respiratory failure ([place]) 01/16/2013  . Diastolic dysfunction 62/56/3893  .  Hoarseness 12/22/2012  . Epistaxis, recurrent 07/08/2012  . GERD (gastroesophageal reflux disease) 09/20/2011  . Pulmonary nodule 09/20/2011  . COPD (chronic obstructive pulmonary disease) ([place]) 04/04/2011  . Diabetes mellitus type 2, controlled ([place]) 04/04/2011  . Hypertension 04/04/2011  . Obstructive sleep apnea 04/04/2011  . Chronic allergic rhinitis 04/04/2011    Past Surgical History:  Procedure Laterality Date  . BREAST BIOPSY    . CARDIAC CATHETERIZATION    . CARDIAC CATHETERIZATION N/A 12/03/2016   Procedure: Left Heart Cath and Coronary Angiography;  Surgeon: Peter M Martinique, MD;  Location: [facility];  Service: Cardiovascular;  Laterality: N/A;  . CHOLECYSTECTOMY  N/A 12/13/2015   Procedure: LAPAROSCOPIC CHOLECYSTECTOMY;  Surgeon: Ralene Ok, MD;  Location: [facility];  Service: General;  Laterality: N/A;  . COLONOSCOPY  09/05/2012   Procedure: COLONOSCOPY;  Surgeon: Beryle Beams, MD;  Location: [facility];  Service: Endoscopy;  Laterality: N/A;  . COLONOSCOPY WITH PROPOFOL N/A 09/02/2015   Procedure: COLONOSCOPY WITH PROPOFOL;  Surgeon: Carol Ada, MD;  Location: [facility];  Service: Endoscopy;  Laterality: N/A;  . ESOPHAGOGASTRODUODENOSCOPY (EGD) WITH PROPOFOL N/A 03/29/2017   Procedure: ESOPHAGOGASTRODUODENOSCOPY (EGD) WITH PROPOFOL;  Surgeon: Carol Ada, MD;  Location: [facility];  Service: Endoscopy;  Laterality: N/A;  . TUBAL LIGATION      OB History    No data available       Home Medications    Prior to Admission medications   Medication Sig Start Date End Date Taking? Authorizing Provider  albuterol (PROVENTIL) (2.5 MG/3ML) 0.083% nebulizer solution Take 2.5 mg by nebulization 4 (four) times daily as needed for wheezing or shortness of breath.     [provider]  allopurinol (ZYLOPRIM) 100 MG tablet Take 100 mg by mouth daily.    [provider]  ALPRAZolam Duanne Moron) 1 MG tablet Take 1 mg by mouth at bedtime as needed for sleep.  04/13/16   [provider]  aspirin EC 81 MG tablet Take 81 mg by mouth daily.    [provider]  azithromycin (ZITHROMAX) 250 MG tablet Take 1 tablet (250 mg total) by mouth daily. 1 tablet daily starting 11/20/2017 11/19/17   Orlie Dakin, MD  budesonide-formoterol [facility]) 160-4.5 MCG/ACT inhaler Inhale 2 puffs into the lungs 2 (two) times daily. 05/03/15   Collene Gobble, MD  Cholecalciferol (VITAMIN D PO) Take 1 tablet by mouth daily.    [provider]  clobetasol cream (TEMOVATE) 7.25 % Apply 1 application topically daily as needed (irritation from pull ups).  03/25/17   [provider]  fluticasone (FLONASE) 50 MCG/ACT nasal spray Place 1  spray into both nostrils daily as needed (congestion). 08/26/17   Aline August, MD  furosemide (LASIX) 40 MG tablet Take 1 tablet (40 mg total) by mouth daily. 12/05/16   Arbutus Leas, NP  glipiZIDE (GLUCOTROL) 10 MG tablet Take 10 mg by mouth 2 (two) times daily before a meal.     [provider]  guaiFENesin (MUCINEX) 600 MG 12 hr tablet Take 1 tablet (600 mg total) by mouth 2 (two) times daily. 08/26/17   Aline August, MD  HYDROcodone-acetaminophen (NORCO) 7.5-325 MG tablet Take 1 tablet by mouth every 6 (six) hours as needed for moderate pain.    [provider]  insulin glargine (LANTUS) 100 unit/mL SOPN Inject 18 Units into the skin at bedtime.     [provider]  Linaclotide Rolan Lipa) 145 MCG CAPS capsule Take 145 mcg by mouth daily.  [provider]  loratadine (CLARITIN) 10 MG tablet Take 10 mg by mouth daily.    [provider]  metoprolol succinate (TOPROL-XL) 25 MG 24 hr tablet Take 2 tablets (50 mg total) by mouth daily. 08/26/17   Aline August, MD  Naphazoline HCl (CLEAR EYES OP) Place 1 drop into both eyes daily.    [provider]  ondansetron (ZOFRAN ODT) 4 MG disintegrating tablet Take 1 tablet (4 mg total) by mouth every 8 (eight) hours as needed for nausea or vomiting. 03/01/17   Blanchie Dessert, MD  OXYGEN Inhale 1-2 L into the lungs continuous. 1 L at night and 2 L during the day    [provider]  pantoprazole (PROTONIX) 40 MG tablet Take 1 tablet (40 mg total) by mouth daily. 08/26/17   Aline August, MD  Potassium Chloride ER 20 MEQ TBCR Take 20 mEq by mouth daily. 12/04/16   Arbutus Leas, NP  pravastatin (PRAVACHOL) 20 MG tablet Take 20 mg by mouth daily.     [provider]  predniSONE (DELTASONE) 20 MG tablet 2 tabs po daily x 4 days 11/19/17   Orlie Dakin, MD  PROAIR HFA 108 [phone] Base) MCG/ACT inhaler INHALE 2 PUFFS BY MOUTH EVERY 4 HOURS AS NEEDED FOR WHEEZING 06/14/16   Byrum, Rose Fillers,  MD  ranitidine (ZANTAC 75) 75 MG tablet Take 1 tablet (75 mg total) 2 (two) times daily by mouth. 09/18/17   Lyda Jester M, PA-C  sitaGLIPtin (JANUVIA) 100 MG tablet Take 100 mg by mouth daily.    [provider]  sodium chloride (OCEAN) 0.65 % SOLN nasal spray Place 1 spray into both nostrils daily as needed for congestion.     [provider]  spironolactone (ALDACTONE) 25 MG tablet Take 0.5 tablets (12.5 mg total) by mouth daily. 12/05/16   Arbutus Leas, NP  tiotropium (SPIRIVA) 18 MCG inhalation capsule Place 1 capsule (18 mcg total) into inhaler and inhale daily. 05/03/15   Collene Gobble, MD  zolpidem (AMBIEN) 10 MG tablet Take 10 mg by mouth at bedtime.    [provider]    Family History Family History  Problem Relation Age of Onset  . Other Father        unaware of father's medical history  . Diabetes Mother        alive @ 8  . Myasthenia gravis Mother   . Lung cancer Paternal Aunt   . Lung cancer Paternal Grandfather   . Other Unknown        multiple siblings a&w.  . Heart attack Neg Hx   . Heart failure Neg Hx     Social History Social History   Tobacco Use  . Smoking status: Former Smoker    Packs/day: 0.50    Years: 36.00    Pack years: 18.00    Types: Cigarettes    Last attempt to quit: 11/11/2015    Years since quitting: 2.0  . Smokeless tobacco: Never Used  . Tobacco comment: Approx 90 pk-yrs (up to 3ppd until ~ 2009). Smoking 3 cigs per day now.  Substance Use Topics  . Alcohol use: No    Alcohol/week: 0.0 oz  . Drug use: No     Allergies   Patient has no known allergies.   Review of Systems Review of Systems  Constitutional: Positive for appetite change, chills, diaphoresis and fever.  HENT: Positive for congestion. Negative for ear pain, rhinorrhea and sore throat.   Respiratory:  Positive for cough, shortness of breath and wheezing.   Cardiovascular: Positive for chest pain.  Gastrointestinal: Positive for  abdominal pain. Negative for diarrhea, nausea and vomiting.  Genitourinary: Negative for dysuria.  Musculoskeletal: Positive for back pain.  Neurological: Positive for headaches.  All other systems reviewed and are negative.    Physical Exam Updated Vital Signs BP 135/63 (BP Location: Left Arm)   Pulse 74   Temp (!) 97.4 F (36.3 C) (Oral)   Resp 20   LMP 06/04/2000 (LMP Unknown)   SpO2 91%   Physical Exam  Constitutional: She is oriented to person, place, and time. She appears well-developed and well-nourished. No distress.  HENT:  Head: Normocephalic and atraumatic.  Right Ear: Hearing, tympanic membrane, external ear and ear canal normal.  Left Ear: Hearing, tympanic membrane, external ear and ear canal normal.  Nose: Nose normal.  Mouth/Throat: Uvula is midline and oropharynx is clear and moist.  Eyes: Conjunctivae are normal. Pupils are equal, round, and reactive to light. Right eye exhibits no discharge. Left eye exhibits no discharge. No scleral icterus.  Neck: Normal range of motion.  Cardiovascular: Normal rate and regular rhythm.  Pulmonary/Chest: Effort normal. No stridor. Tachypnea (mild) noted. No respiratory distress. She has wheezes (mild expiratory). She has no rales. She exhibits no tenderness.  Abdominal: Soft. Bowel sounds are normal. She exhibits no distension. There is no tenderness.  Musculoskeletal:  No peripheral edema  Neurological: She is alert and oriented to person, place, and time.  Skin: Skin is warm and dry.  Psychiatric: She has a normal mood and affect. Her behavior is normal.  Nursing note and vitals reviewed.    ED Treatments / Results  Labs (all labs ordered are listed, but only abnormal results are displayed) Labs Reviewed  BASIC METABOLIC PANEL - Abnormal; Notable for the following components:      Result Value   Glucose, Bld 105 (*)    All other components within normal limits  CBC WITH DIFFERENTIAL/PLATELET - Abnormal; Notable  for the following components:   Hemoglobin 11.8 (*)    HCT 34.5 (*)    All other components within normal limits  BRAIN NATRIURETIC PEPTIDE  I-STAT TROPONIN, ED    EKG  EKG Interpretation  Date/Time:  Sunday December 01 2017 11:42:35 EST Ventricular Rate:  74 PR Interval:    QRS Duration: 82 QT Interval:  421 QTC Calculation: 468 R Axis:   65 Text Interpretation:  Sinus rhythm Low voltage, precordial leads ST changes similar to prior tracing. No STEMI.  Confirmed by Nanda Quinton [phone]) on 12/01/2017 11:56:07 AM Also confirmed by Nanda Quinton [phone]), editor Laurena Spies [phone])  on 12/01/2017 12:09:55 PM       Radiology Dg Chest 2 View  Result Date: 12/01/2017 CLINICAL DATA:  Shortness of breath and cough EXAM: CHEST  2 VIEW COMPARISON:  November 19, 2017 FINDINGS: There is no edema or consolidation. Heart is upper normal in size with pulmonary vascularity within normal limits. There is aortic atherosclerosis. There is degenerative change in the thoracic spine. There is evidence of old rib trauma on the left. IMPRESSION: No edema or consolidation. There is aortic atherosclerosis. Stable cardiac silhouette. Aortic Atherosclerosis (ICD10-I70.0). Electronically Signed   By: Lowella Grip III M.D.   On: 12/01/2017 12:07    Procedures Procedures (including critical care time)  Medications Ordered in ED Medications  albuterol (PROVENTIL) (2.5 MG/3ML) 0.083% nebulizer solution 5 mg (5 mg Nebulization Given 12/01/17 1219)  predniSONE (DELTASONE) tablet 60  mg (60 mg Oral Given 12/01/17 1441)  ipratropium-albuterol (DUONEB) 0.5-2.5 (3) MG/3ML nebulizer solution 3 mL (3 mLs Nebulization Given 12/01/17 1441)  ibuprofen (ADVIL,MOTRIN) tablet 400 mg (400 mg Oral Given 12/01/17 1441)     Initial Impression / Assessment and Plan / ED Course  I have reviewed the triage vital signs and the nursing notes.  Pertinent labs & imaging results that were available during my care of the patient  were reviewed by me and considered in my medical decision making (see chart for details).  62 year old female presents with mild SOB likely due to mild COPD exacerbation from URI symptoms. She is mildly hypertensive otherwise vitals are normal. No hypoxia is noted. She has not had increased O2 requirements. She has multiple complaints including dry nose, headache, chest pain/back pain which are MSK in nature. Work up is reassuring. CBC is remarkable for mild anemia. BMP is normal. Trop is normal. BNP is normal. EKG is normal. CXR is normal. She had good relief of SOB after a breathing tx, prednisone, ibuprofen although she is still having a headache. She was given rx fo prednisone and encouraged to use nasal saline. She was also advised to f/u with her doctor. Return precautions were given.   Final Clinical Impressions(s) / ED Diagnoses   Final diagnoses:  COPD exacerbation ([place])  Upper respiratory tract infection, unspecified type    ED Discharge Orders    None       Recardo Evangelist, PA-C 12/01/17 Pauline Aus    Fredia Sorrow, MD 12/02/17 [phone]

## 2017-12-01 NOTE — ED Triage Notes (Signed)
Patient coming from home with c/o shortness of breath and productive cough yellow thick mucus. Pt on 2.5 L of oxygen at home. Pt have chronic hx COPD and CHF. Pt given albuterol mg and 0.5 mg of Atrovent per ems.

## 2017-12-01 NOTE — ED Notes (Signed)
Bed: WA14 Expected date:  Expected time:  Means of arrival:  Comments: ems 

## 2017-12-16 ENCOUNTER — Other Ambulatory Visit: Payer: Self-pay | Admitting: Emergency Medicine

## 2017-12-16 MED ORDER — ALBUTEROL SULFATE (2.5 MG/3ML) 0.083% IN NEBU: 2.5000 mg | INHALATION_SOLUTION | Freq: Four times a day (QID) | RESPIRATORY_TRACT | 6 refills | Status: DC | PRN

## 2017-12-16 NOTE — Telephone Encounter (Signed)
Called and spoke with patient, patient states that she is running out of her proventil nebulizer solution faster than normal. She uses this 3 times daily sometimes four if needed. She has not had to use it more than 3 times a day recently. Patient states that she is still getting the same amount of vials at each refill that she has in the past. Patient also states that if she does not take a breathing treatment then she has to call 911. Patient denies any active symptoms. Patient states that she is going out of town this coming Friday until the following Tuesday and is requesting a refill on this medication. Instructions from last OV 11.13.18 are below. CY please advise if this is ok to refill or if the patient needs to be seen.       Take an extra lasix tablet tonight and tomorrow. Then go back to once a day.  Continue your Spiriva and Symbcort as you are taking them  Only use albuterol (either inhaler or nebulizer) up to every 4 hours if needed for shortness of breath, wheezing. Prevnar 13 today Flu shot up-to-date Continue oxygen at 2.5-3 L/min during the day, 1 L/min at night while sleeping. We will obtain copies of your sleep study from earlier this year Follow with Dr Lamonte Sakai in 4 months or sooner if you have any problems.    Current Outpatient Medications on File Prior to Visit  Medication Sig Dispense Refill  . albuterol (PROVENTIL) (2.5 MG/3ML) 0.083% nebulizer solution Take 2.5 mg by nebulization 4 (four) times daily as needed for wheezing or shortness of breath.     . allopurinol (ZYLOPRIM) 100 MG tablet Take 100 mg by mouth daily.    Marland Kitchen ALPRAZolam (XANAX) 1 MG tablet Take 1 mg by mouth at bedtime as needed for sleep.   0  . aspirin EC 81 MG tablet Take 81 mg by mouth daily.    Marland Kitchen azithromycin (ZITHROMAX) 250 MG tablet Take 1 tablet (250 mg total) by mouth daily. 1 tablet daily starting 11/20/2017 (Patient not taking: Reported on 12/01/2017) 6 tablet 0  . budesonide-formoterol (SYMBICORT)  160-4.5 MCG/ACT inhaler Inhale 2 puffs into the lungs 2 (two) times daily. 1 Inhaler 6  . Cholecalciferol (VITAMIN D PO) Take 1 tablet by mouth daily.    . clobetasol cream (TEMOVATE) 5.63 % Apply 1 application topically daily as needed (irritation from pull ups).   0  . fluconazole (DIFLUCAN) 150 MG tablet Take 150 mg by mouth daily.  0  . fluticasone (FLONASE) 50 MCG/ACT nasal spray Place 1 spray into both nostrils daily as needed (congestion).    . furosemide (LASIX) 40 MG tablet Take 1 tablet (40 mg total) by mouth daily. 30 tablet 12  . glipiZIDE (GLUCOTROL) 10 MG tablet Take 10 mg by mouth 2 (two) times daily before a meal.     . guaiFENesin (MUCINEX) 600 MG 12 hr tablet Take 1 tablet (600 mg total) by mouth 2 (two) times daily. 14 tablet 0  . HYDROcodone-acetaminophen (NORCO) 7.5-325 MG tablet Take 1 tablet by mouth every 6 (six) hours as needed for moderate pain.    Marland Kitchen insulin glargine (LANTUS) 100 unit/mL SOPN Inject 18 Units into the skin at bedtime.     . Linaclotide (LINZESS) 145 MCG CAPS capsule Take 145 mcg by mouth daily.     Marland Kitchen loratadine (CLARITIN) 10 MG tablet Take 10 mg by mouth daily.    . metoprolol succinate (TOPROL-XL) 25 MG 24 hr tablet  Take 2 tablets (50 mg total) by mouth daily. (Patient not taking: Reported on 12/01/2017)    . metoprolol succinate (TOPROL-XL) 50 MG 24 hr tablet Take 50 mg by mouth daily. Take with or immediately following a meal.    . Naphazoline HCl (CLEAR EYES OP) Place 1 drop into both eyes daily.    . ondansetron (ZOFRAN ODT) 4 MG disintegrating tablet Take 1 tablet (4 mg total) by mouth every 8 (eight) hours as needed for nausea or vomiting. 20 tablet 0  . OXYGEN Inhale 1-2 L into the lungs continuous. 1 L at night and 2 L during the day    . pantoprazole (PROTONIX) 40 MG tablet Take 1 tablet (40 mg total) by mouth daily.    . Potassium Chloride ER 20 MEQ TBCR Take 20 mEq by mouth daily. 30 tablet 12  . pravastatin (PRAVACHOL) 20 MG tablet Take 20 mg  by mouth every evening.     . predniSONE (DELTASONE) 20 MG tablet Take 2 tablets (40 mg total) by mouth daily. 10 tablet 0  . PROAIR HFA 108 (90 Base) MCG/ACT inhaler INHALE 2 PUFFS BY MOUTH EVERY 4 HOURS AS NEEDED FOR WHEEZING 8.5 g 2  . ranitidine (ZANTAC 75) 75 MG tablet Take 1 tablet (75 mg total) 2 (two) times daily by mouth. 60 tablet 5  . sitaGLIPtin (JANUVIA) 100 MG tablet Take 100 mg by mouth daily.    Marland Kitchen spironolactone (ALDACTONE) 25 MG tablet Take 0.5 tablets (12.5 mg total) by mouth daily. 15 tablet 12  . tiotropium (SPIRIVA) 18 MCG inhalation capsule Place 1 capsule (18 mcg total) into inhaler and inhale daily. 30 capsule 6  . zolpidem (AMBIEN) 10 MG tablet Take 10 mg by mouth at bedtime.     No current facility-administered medications on file prior to visit.      No Known Allergies

## 2017-12-16 NOTE — Telephone Encounter (Signed)
Per Dr. Annamaria Boots it is ok to send in a refill of the proventil. Patient will have enough incase she were to do nebulizer treatment every 4 hours. Will send in 540 ml with 6 refills for COPD incase insurance will not approve.

## 2018-01-21 ENCOUNTER — Ambulatory Visit: Payer: Medicaid Other | Admitting: Adult Health

## 2018-01-21 ENCOUNTER — Encounter: Payer: Self-pay | Admitting: Adult Health

## 2018-01-21 DIAGNOSIS — J441 Chronic obstructive pulmonary disease with (acute) exacerbation: Secondary | ICD-10-CM | POA: Diagnosis not present

## 2018-01-21 DIAGNOSIS — J9611 Chronic respiratory failure with hypoxia: Secondary | ICD-10-CM

## 2018-01-21 MED ORDER — PREDNISONE 10 MG PO TABS: ORAL_TABLET | ORAL | 0 refills | Status: DC

## 2018-01-21 MED ORDER — AMOXICILLIN-POT CLAVULANATE 875-125 MG PO TABS: 1.0000 | ORAL_TABLET | Freq: Two times a day (BID) | ORAL | 0 refills | Status: AC

## 2018-01-21 NOTE — Assessment & Plan Note (Signed)
Cont on o2 .  

## 2018-01-21 NOTE — Progress Notes (Signed)
62 y/o F PMH: former smoker quit on 11/11/2015,COPD on 2.5L O2 24/7, CHF EF 60-65%, DM, GERD.  Presents today with complaints of worsened SOB with exertion for the last 7 days.  Pt endorses chronic cough that occurs throughout the day, has noticed an increased in sputum production over the last week.  Sputum is milky/white and thick.  Noticed some wheezing 5 days ago. Not taking anything for the cough. Continues to take Symbicort and Spiriva as prescribed.  Using albuterol nebulizer every 3 to 4 hours and in addition to that, she has been using the albuterol inhaler 2 to 3 times daily over the last week.  Chronic oxygen use: oxygen at 2.5 L/min during the day/home, 3 L/min while out and about to doctor appointments and shopping, and 2 L/min at night while sleeping.  Denies fever, chills, chest pain, or hemoptysis.  Endorses edema to left leg on occasion, which is her baseline.     Health Maintenance: Up to date on Influenza vaccine, Prevnar 13 vaccine and Pneumonia vaccine 23.  PE  Gen: Obese, in no distress,  normal affect  ENT: No lesions,  mouth clear,  oropharynx clear, no postnasal drip  Neck: No JVD, no TMG, no carotid bruits  Lungs: No use of accessory muscles, distant, diminished in the bases, no wheezes   Cardiovascular: RRR, heart sounds normal, no murmur or gallops, no peripheral edema  Musculoskeletal: No deformities, no cyanosis or clubbing  Neuro: alert, non focal  Skin: Warm, no lesions or rashes   I/P COPD exacerbation Augmentin 875 twice daily for 1 week, take with food Prednisone taper over the next week, take with food Mucinex DM twice daily as needed for cough and congestion Continue on Symbicort twice daily, rinse after use Continue on Spiriva daily, rinse after use Continue on oxygen.   Follow-up with [name] in 1 months and as needed Please contact office for sooner follow up if symptoms do not improve or worsen or seek emergency care

## 2018-01-21 NOTE — Progress Notes (Signed)
@Patient  ID: Stephanie Schultz, female    DOB: 1955-12-18, 62 y.o.   MRN: [id number]  Chief Complaint  Patient presents with  . Follow-up    copd     Referring provider: [name], MD  HPI: 62 year old female former smoker followed for severe COPD with asthmatic component, chronic cough and chronic hypoxic respiratory failure on nocturnal  oxygen  Past medical history significant for diastolic dysfunction  8/46/9629 Follow up ; COPD  Patient presents for a 43-month follow-up.  Patient has underlying severe COPD. Patient remains on Symbicort twice daily and Spiriva daily Says her breathing has not been doing as well for last week with increased cough, congestion with thick white and yellow mucus. Has intermittent wheezing .  She denies any hemoptysis orthopnea or edema  Patient had 2 emergency room visits in January for COPD exacerbations.  Says it was from family member smoking .   She has diastolic heart failure on Aldactone and Lasix.  Her lower extremely swelling has been doing well w/ no swelling .      No Known Allergies  Immunization History  Administered Date(s) Administered  . Influenza Split 09/20/2011, 09/12/2012, 08/12/2013  . Influenza,inj,Quad PF,6+ Mos 08/03/2014, 08/12/2015, 08/12/2017  . Pneumococcal Conjugate-13 09/24/2017  . Pneumococcal Polysaccharide-23 11/12/2009    Past Medical History:  Diagnosis Date  . Anemia    chronis  . Anxiety   . Arthritis   . Cervical cancer ([place]) 1990   cervical   . Chronic diastolic CHF (congestive heart failure) ([place]) 08/10/2016  . COPD (chronic obstructive pulmonary disease) ([place])    O2 dependent. Pulmo: [name]  . Diabetes mellitus without complication ([place])    a. A1c 8.3 in 11/2015  . Emphysema   . Gallstones    s/p cholecystectomy  . GERD (gastroesophageal reflux disease)   . Gout   . History of home oxygen therapy    "2.5L; 24/7" (08/10/2016)  . Hx of cardiac catheterization    a. LHC at [facility]  in [place], [place] 09/2008:  Normal coronary arteries EF 70%. b. LHC 11/2016: normal cors, normal LVEDP, EF 55-65%.  . Hyperlipidemia   . Hypertension   . Morbid obesity ([place])   . OSA on CPAP    CPAP at night   . Pneumonia   . Sickle cell trait ([place])   . Supplemental oxygen dependent    2L CONTINUOUSLEY  . Tobacco abuse    a. up to 3ppd from age 83 to 67, now 1/4 ppd (01/2013) >> Quit 10/2015    Tobacco History: Social History   Tobacco Use  Smoking Status Former Smoker  . Packs/day: 0.50  . Years: 36.00  . Pack years: 18.00  . Types: Cigarettes  . Last attempt to quit: 11/11/2015  . Years since quitting: 2.1  Smokeless Tobacco Never Used  Tobacco Comment   Approx 90 pk-yrs (up to 3ppd until ~ 2009). Smoking 3 cigs per day now.   Counseling given: Not Answered Comment: Approx 90 pk-yrs (up to 3ppd until ~ 2009). Smoking 3 cigs per day now.   Outpatient Encounter Medications as of 01/21/2018  Medication Sig  . albuterol (PROVENTIL) (2.5 MG/3ML) 0.083% nebulizer solution Take 3 mLs (2.5 mg total) by nebulization 4 (four) times daily as needed for wheezing or shortness of breath.  . allopurinol (ZYLOPRIM) 100 MG tablet Take 100 mg by mouth daily.  Marland Kitchen ALPRAZolam (XANAX) 1 MG tablet Take 1 mg by mouth at bedtime as needed for sleep.   Marland Kitchen  aspirin EC 81 MG tablet Take 81 mg by mouth daily.  . budesonide-formoterol (SYMBICORT) 160-4.5 MCG/ACT inhaler Inhale 2 puffs into the lungs 2 (two) times daily.  . Cholecalciferol (VITAMIN D PO) Take 1 tablet by mouth daily.  . clobetasol cream (TEMOVATE) 8.11 % Apply 1 application topically daily as needed (irritation from pull ups).   . fluconazole (DIFLUCAN) 150 MG tablet Take 150 mg by mouth daily.  . fluticasone (FLONASE) 50 MCG/ACT nasal spray Place 1 spray into both nostrils daily as needed (congestion).  . furosemide (LASIX) 40 MG tablet Take 1 tablet (40 mg total) by mouth daily.  Marland Kitchen glipiZIDE (GLUCOTROL) 10 MG tablet Take 10 mg by mouth 2  (two) times daily before a meal.   . guaiFENesin (MUCINEX) 600 MG 12 hr tablet Take 1 tablet (600 mg total) by mouth 2 (two) times daily.  Marland Kitchen HYDROcodone-acetaminophen (NORCO) 7.5-325 MG tablet Take 1 tablet by mouth every 6 (six) hours as needed for moderate pain.  Marland Kitchen insulin glargine (LANTUS) 100 unit/mL SOPN Inject 18 Units into the skin at bedtime.   . Linaclotide (LINZESS) 145 MCG CAPS capsule Take 145 mcg by mouth daily.   Marland Kitchen loratadine (CLARITIN) 10 MG tablet Take 10 mg by mouth daily.  . metoprolol succinate (TOPROL-XL) 25 MG 24 hr tablet Take 2 tablets (50 mg total) by mouth daily.  . metoprolol succinate (TOPROL-XL) 50 MG 24 hr tablet Take 50 mg by mouth daily. Take with or immediately following a meal.  . Naphazoline HCl (CLEAR EYES OP) Place 1 drop into both eyes daily.  . ondansetron (ZOFRAN ODT) 4 MG disintegrating tablet Take 1 tablet (4 mg total) by mouth every 8 (eight) hours as needed for nausea or vomiting.  . OXYGEN Inhale 1-2 L into the lungs continuous. 1 L at night and 2 L during the day  . pantoprazole (PROTONIX) 40 MG tablet Take 1 tablet (40 mg total) by mouth daily.  . Potassium Chloride ER 20 MEQ TBCR Take 20 mEq by mouth daily.  . pravastatin (PRAVACHOL) 20 MG tablet Take 20 mg by mouth every evening.   Marland Kitchen PROAIR HFA 108 (90 Base) MCG/ACT inhaler INHALE 2 PUFFS BY MOUTH EVERY 4 HOURS AS NEEDED FOR WHEEZING  . ranitidine (ZANTAC 75) 75 MG tablet Take 1 tablet (75 mg total) 2 (two) times daily by mouth.  . sitaGLIPtin (JANUVIA) 100 MG tablet Take 100 mg by mouth daily.  Marland Kitchen spironolactone (ALDACTONE) 25 MG tablet Take 0.5 tablets (12.5 mg total) by mouth daily.  Marland Kitchen tiotropium (SPIRIVA) 18 MCG inhalation capsule Place 1 capsule (18 mcg total) into inhaler and inhale daily.  Marland Kitchen zolpidem (AMBIEN) 10 MG tablet Take 10 mg by mouth at bedtime.  Marland Kitchen amoxicillin-clavulanate (AUGMENTIN) 875-125 MG tablet Take 1 tablet by mouth 2 (two) times daily for 7 days.  . predniSONE (DELTASONE) 10  MG tablet 4 tabs for 2 days, then 3 tabs for 2 days, 2 tabs for 2 days, then 1 tab for 2 days, then stop  . predniSONE (DELTASONE) 20 MG tablet Take 2 tablets (40 mg total) by mouth daily. (Patient not taking: Reported on 01/21/2018)  . [DISCONTINUED] azithromycin (ZITHROMAX) 250 MG tablet Take 1 tablet (250 mg total) by mouth daily. 1 tablet daily starting 11/20/2017 (Patient not taking: Reported on 01/21/2018)   No facility-administered encounter medications on file as of 01/21/2018.      Review of Systems  Constitutional:   No  weight loss, night sweats,  Fevers, chills,  +fatigue,  or  lassitude.  HEENT:   No headaches,  Difficulty swallowing,  Tooth/dental problems, or  Sore throat,                No sneezing, itching, ear ache,  +nasal congestion, post nasal drip,   CV:  No chest pain,  Orthopnea, PND, swelling in lower extremities, anasarca, dizziness, palpitations, syncope.   GI  No heartburn, indigestion, abdominal pain, nausea, vomiting, diarrhea, change in bowel habits, loss of appetite, bloody stools.   Resp: .  No wheezing.  No chest wall deformity  Skin: no rash or lesions.  GU: no dysuria, change in color of urine, no urgency or frequency.  No flank pain, no hematuria   MS:  No joint pain or swelling.  No decreased range of motion.  No back pain.    Physical Exam  BP 122/76 (BP Location: Left Arm, Cuff Size: Normal)   Pulse 74   Ht 5' (1.524 m)   Wt 222 lb 12.8 oz (101.1 kg)   LMP 06/04/2000 (LMP Unknown)   SpO2 95%   BMI 43.51 kg/m   GEN: A/Ox3; pleasant , NAD, obese on O2    HEENT:  [place]/AT,  EACs-clear, TMs-wnl, NOSE-clear drainage , THROAT-clear, no lesions, no postnasal drip or exudate noted.   NECK:  Supple w/ fair ROM; no JVD; normal carotid impulses w/o bruits; no thyromegaly or nodules palpated; no lymphadenopathy.    RESP  Decreased BS in bases , no accessory muscle use, no dullness to percussion  CARD:  RRR, no m/r/g, no peripheral edema, pulses  intact, no cyanosis or clubbing.  GI:   Soft & nt; nml bowel sounds; no organomegaly or masses detected.   Musco: Warm bil, no deformities or joint swelling noted.   Neuro: alert, no focal deficits noted.    Skin: Warm, no lesions or rashes    Lab Results:  CBC  BMET  Imaging: No results found.   Assessment & Plan:   COPD (chronic obstructive pulmonary disease) ([place]) Exacerbation   Plan  Patient Instructions  Augmentin 875 twice daily for 1 week, take with food Prednisone taper over the next week, take with food Mucinex DM twice daily as needed for cough and congestion Continue on Symbicort twice daily, rinse after use Continue on Spiriva daily, rinse after use Continue on oxygen.   Follow-up with [name] in 1 months and as needed Please contact office for sooner follow up if symptoms do not improve or worsen or seek emergency care        Chronic respiratory failure ([place]) Cont on o2      Peggye Poon, NP 01/21/2018

## 2018-01-21 NOTE — Assessment & Plan Note (Signed)
Exacerbation   Plan  Patient Instructions  Augmentin 875 twice daily for 1 week, take with food Prednisone taper over the next week, take with food Mucinex DM twice daily as needed for cough and congestion Continue on Symbicort twice daily, rinse after use Continue on Spiriva daily, rinse after use Continue on oxygen.   Follow-up with [name] in 1 months and as needed Please contact office for sooner follow up if symptoms do not improve or worsen or seek emergency care

## 2018-01-21 NOTE — Patient Instructions (Addendum)
Augmentin 875 twice daily for 1 week, take with food Prednisone taper over the next week, take with food Mucinex DM twice daily as needed for cough and congestion Continue on Symbicort twice daily, rinse after use Continue on Spiriva daily, rinse after use Continue on oxygen.   Follow-up with [name] in 1 months and as needed Please contact office for sooner follow up if symptoms do not improve or worsen or seek emergency care

## 2018-01-28 ENCOUNTER — Ambulatory Visit: Payer: Medicaid Other | Admitting: Emergency Medicine

## 2018-01-31 ENCOUNTER — Ambulatory Visit (INDEPENDENT_AMBULATORY_CARE_PROVIDER_SITE_OTHER): Payer: Medicaid Other

## 2018-01-31 ENCOUNTER — Ambulatory Visit: Payer: Medicaid Other

## 2018-01-31 ENCOUNTER — Ambulatory Visit: Payer: Medicaid Other | Admitting: Podiatry

## 2018-01-31 ENCOUNTER — Encounter: Payer: Self-pay | Admitting: Podiatry

## 2018-01-31 DIAGNOSIS — M722 Plantar fascial fibromatosis: Secondary | ICD-10-CM

## 2018-01-31 DIAGNOSIS — B351 Tinea unguium: Secondary | ICD-10-CM | POA: Diagnosis not present

## 2018-01-31 DIAGNOSIS — M79676 Pain in unspecified toe(s): Secondary | ICD-10-CM

## 2018-01-31 DIAGNOSIS — M1990 Unspecified osteoarthritis, unspecified site: Secondary | ICD-10-CM

## 2018-01-31 DIAGNOSIS — M79609 Pain in unspecified limb: Secondary | ICD-10-CM

## 2018-01-31 DIAGNOSIS — E119 Type 2 diabetes mellitus without complications: Secondary | ICD-10-CM

## 2018-01-31 NOTE — Progress Notes (Signed)
Complaint:  Visit Type: Patient returns to my office for continued preventative foot care services. Complaint: Patient states" my nails have grown long and thick and become painful to walk and wear shoes" Patient has been diagnosed with DM with no foot complications. The patient presents for preventative foot care services. No changes to ROS.  During this visit  she has been experiencing burning and numbness pain from the midline take left leg to the toes of her left foot. He says she is not experiencing any numbness, pain in her right foot.  She says this has been getting increasingly worse over time.  She desires to be evaluated and treated for this burning.  Podiatric Exam: Vascular: dorsalis pedis and posterior tibial pulses are palpable bilateral. Capillary return is immediate. Temperature gradient is WNL. Skin turgor WNL  Sensorium: Normal Semmes Weinstein monofilament test. Normal tactile sensation bilaterally. Burning  noted on the dorsum of the left foot and leg. Nail Exam: Pt has thick disfigured discolored nails with subungual debris noted bilateral entire nail hallux through fifth toenails.  Marked incurvation noted along the lateral border left great toenail. Ulcer Exam: There is no evidence of ulcer or pre-ulcerative changes or infection. Orthopedic Exam: Muscle tone and strength are WNL. No limitations in general ROM. No crepitus or effusions noted. Foot type and digits show no abnormalities. Palpable pain along the bottom of left foot and under her metatarsal heads left foot. Skin: No Porokeratosis. No infection or ulcers  Diagnosis:  Onychomycosis, , Pain in right toe, pain in left toes,  Diabetic neuropathy.  Treatment & Plan Procedures and Treatment: Consent by patient was obtained for treatment procedures.   Debridement of mycotic and hypertrophic toenails, 1 through 5 bilateral and clearing of subungual debris. No ulceration, no infection noted. X-rays were taken to reveal  calcification at the insertion of the plantar fascia and the Achilles tendon.  No other pathologic radiographic findings are noted.  I took the x-rays since she states she had unilateral burning.  I told this patient that it was probably due to her diabetic neuropathy but I prescribed a plantar fascial brace for her left foot.  RTC 3 months Return Visit-Office Procedure: Patient instructed to return to the office for a follow up visit 3 months for continued evaluation and treatment.    Gardiner Barefoot DPM

## 2018-02-08 ENCOUNTER — Emergency Department (HOSPITAL_COMMUNITY)
Admission: EM | Admit: 2018-02-08 | Discharge: 2018-02-08 | Disposition: A | Discharge status: Home / Self Care | Payer: Medicaid Other | Attending: Emergency Medicine | Admitting: Emergency Medicine

## 2018-02-08 ENCOUNTER — Emergency Department (HOSPITAL_COMMUNITY): Payer: Medicaid Other

## 2018-02-08 ENCOUNTER — Encounter (HOSPITAL_COMMUNITY): Payer: Self-pay

## 2018-02-08 DIAGNOSIS — M549 Dorsalgia, unspecified: Secondary | ICD-10-CM | POA: Diagnosis not present

## 2018-02-08 DIAGNOSIS — Z8541 Personal history of malignant neoplasm of cervix uteri: Secondary | ICD-10-CM | POA: Diagnosis not present

## 2018-02-08 DIAGNOSIS — J9801 Acute bronchospasm: Secondary | ICD-10-CM | POA: Diagnosis not present

## 2018-02-08 DIAGNOSIS — R062 Wheezing: Secondary | ICD-10-CM | POA: Insufficient documentation

## 2018-02-08 DIAGNOSIS — R05 Cough: Secondary | ICD-10-CM | POA: Diagnosis not present

## 2018-02-08 DIAGNOSIS — E1140 Type 2 diabetes mellitus with diabetic neuropathy, unspecified: Secondary | ICD-10-CM | POA: Diagnosis not present

## 2018-02-08 DIAGNOSIS — Z794 Long term (current) use of insulin: Secondary | ICD-10-CM | POA: Diagnosis not present

## 2018-02-08 DIAGNOSIS — I5032 Chronic diastolic (congestive) heart failure: Secondary | ICD-10-CM | POA: Diagnosis not present

## 2018-02-08 DIAGNOSIS — R0602 Shortness of breath: Secondary | ICD-10-CM | POA: Diagnosis present

## 2018-02-08 DIAGNOSIS — J449 Chronic obstructive pulmonary disease, unspecified: Secondary | ICD-10-CM | POA: Insufficient documentation

## 2018-02-08 DIAGNOSIS — Z87891 Personal history of nicotine dependence: Secondary | ICD-10-CM | POA: Diagnosis not present

## 2018-02-08 DIAGNOSIS — I110 Hypertensive heart disease with heart failure: Secondary | ICD-10-CM | POA: Insufficient documentation

## 2018-02-08 LAB — BASIC METABOLIC PANEL
Anion gap: 13 (ref 5–15)
BUN: 5 mg/dL — AB (ref 6–20)
CO2: 26 mmol/L (ref 22–32)
Calcium: 9.6 mg/dL (ref 8.9–10.3)
Chloride: 103 mmol/L (ref 101–111)
Creatinine, Ser: 0.97 mg/dL (ref 0.44–1.00)
GFR calc non Af Amer: >60 mL/min (ref >60)
Glucose, Bld: 96 mg/dL (ref 65–99)
POTASSIUM: 3.6 mmol/L (ref 3.5–5.1)
SODIUM: 142 mmol/L (ref 135–145)

## 2018-02-08 LAB — CBC WITH DIFFERENTIAL/PLATELET
BASOS ABS: 0 10*3/uL (ref 0.0–0.1)
Basophils Relative: 0 %
EOS ABS: 0.2 10*3/uL (ref 0.0–0.7)
EOS PCT: 3 %
HCT: 36.8 % (ref 36.0–46.0)
Hemoglobin: 12.1 g/dL (ref 12.0–15.0)
LYMPHS PCT: 35 %
Lymphs Abs: 2.5 10*3/uL (ref 0.7–4.0)
MCH: 27.4 pg (ref 26.0–34.0)
MCHC: 32.9 g/dL (ref 30.0–36.0)
MCV: 83.4 fL (ref 78.0–100.0)
Monocytes Absolute: 0.4 10*3/uL (ref 0.1–1.0)
Monocytes Relative: 5 %
Neutro Abs: 4 10*3/uL (ref 1.7–7.7)
Neutrophils Relative %: 57 %
PLATELETS: 231 10*3/uL (ref 150–400)
RBC: 4.41 MIL/uL (ref 3.87–5.11)
RDW: 14.9 % (ref 11.5–15.5)
WBC: 7.1 10*3/uL (ref 4.0–10.5)

## 2018-02-08 MED ORDER — PREDNISONE 10 MG PO TABS: 20.0000 mg | ORAL_TABLET | Freq: Every day | ORAL | 0 refills | Status: DC

## 2018-02-08 MED ORDER — PREDNISONE 20 MG PO TABS
60.0000 mg | ORAL_TABLET | Freq: Once | ORAL | Status: AC
Start: 2018-02-08 — End: 2018-02-08
  Administered 2018-02-08: 60 mg via ORAL
  Filled 2018-02-08: qty 3

## 2018-02-08 MED ORDER — IPRATROPIUM-ALBUTEROL 0.5-2.5 (3) MG/3ML IN SOLN
3.0000 mL | Freq: Once | RESPIRATORY_TRACT | Status: AC
  Administered 2018-02-08: 3 mL via RESPIRATORY_TRACT
  Filled 2018-02-08: qty 3

## 2018-02-08 NOTE — ED Provider Notes (Signed)
[facility]PART[facility] Provider Note   CSN: [id number] Arrival date & time: 02/08/18  1846     History   Chief Complaint Chief Complaint  Patient presents with  . Shortness of Breath  . Back Pain    HPI Stephanie Schultz is a 62 y.o. female.  62 year old female with prior history of COPD, CHF, diabetes, hypertension presents with complaint of shortness of breath.  Patient reports that over the last week she has had intermittent persistent wheezing.  Patient denies associated chest pain.  Patient denies associated fevers, nausea, vomiting, or abdominal pain.  Patient does report mild cough at times.  Patient reports that she uses breathing treatments at home without significant improvement.  She reports that she was on prednisone for similar symptoms about 2 weeks ago with improvement after the short course of steroids.  The history is provided by the patient.  Shortness of Breath  This is a recurrent problem. The average episode lasts 6 days. The problem occurs frequently.The current episode started more than 2 days ago. The problem has not changed since onset.Associated symptoms include wheezing. Pertinent negatives include no fever. The problem's precipitants include pollens and weather/humidity. She has tried oral steroids and beta-agonist inhalers for the symptoms. The treatment provided mild relief. She has had no prior hospitalizations. She has had no prior ED visits. She has had no prior ICU admissions.  Back Pain   Pertinent negatives include no fever.    Past Medical History:  Diagnosis Date  . Anemia    chronis  . Anxiety   . Arthritis   . Cervical cancer ([place]) 1990   cervical   . Chronic diastolic CHF (congestive heart failure) ([place]) 08/10/2016  . COPD (chronic obstructive pulmonary disease) ([place])    O2 dependent. Pulmo: [name]  . Diabetes mellitus without complication ([place])    a. A1c 8.3 in 11/2015  . Emphysema   . Gallstones    s/p cholecystectomy  . GERD (gastroesophageal reflux disease)   . Gout   . History of home oxygen therapy    "2.5L; 24/7" (08/10/2016)  . Hx of cardiac catheterization    a. LHC at [facility] in [place], [place] 09/2008:  Normal coronary arteries EF 70%. b. LHC 11/2016: normal cors, normal LVEDP, EF 55-65%.  . Hyperlipidemia   . Hypertension   . Morbid obesity ([place])   . OSA on CPAP    CPAP at night   . Pneumonia   . Sickle cell trait ([place])   . Supplemental oxygen dependent    2L CONTINUOUSLEY  . Tobacco abuse    a. up to 3ppd from age 55 to 59, now 1/4 ppd (01/2013) >> Quit 10/2015    Patient Active Problem List   Diagnosis Date Noted  . Acute respiratory distress 08/23/2017  . Acute on chronic diastolic CHF (congestive heart failure) ([place]) 08/10/2016  . Acute respiratory failure ([place]) 07/20/2016  . S/P laparoscopic cholecystectomy 12/13/2015  . Chronic respiratory failure ([place]) 03/31/2015  . Rib fracture 03/31/2015  . Acute respiratory failure with hypoxia ([place]) 02/17/2015  . Arthritis 12/13/2014  . Gout 03/03/2014  . Diabetic neuropathy ([place]) 03/03/2014  . Carpal tunnel syndrome 03/03/2014  . DM type 2 (diabetes mellitus, type 2) ([place]) 05/22/2013  . Anxiety 05/22/2013  . Transaminasemia 05/09/2013  . Leukocytosis 04/27/2013  . Tobacco abuse   . Chest pain   . Sickle cell trait ([place])   . Morbid obesity ([place])   . Acute on chronic respiratory  failure ([place]) 01/16/2013  . Diastolic dysfunction 32/99/2426  . Hoarseness 12/22/2012  . Epistaxis, recurrent 07/08/2012  . GERD (gastroesophageal reflux disease) 09/20/2011  . Pulmonary nodule 09/20/2011  . COPD (chronic obstructive pulmonary disease) ([place]) 04/04/2011  . Diabetes mellitus type 2, controlled ([employer]) 04/04/2011  . Hypertension 04/04/2011  . Obstructive sleep apnea 04/04/2011  . Chronic allergic rhinitis 04/04/2011    Past Surgical History:  Procedure Laterality Date  . BREAST BIOPSY    . CARDIAC CATHETERIZATION    .  CARDIAC CATHETERIZATION N/A 12/03/2016   Procedure: Left Heart Cath and Coronary Angiography;  Surgeon: Peter M Martinique, MD;  Location: [facility];  Service: Cardiovascular;  Laterality: N/A;  . CHOLECYSTECTOMY N/A 12/13/2015   Procedure: LAPAROSCOPIC CHOLECYSTECTOMY;  Surgeon: Ralene Ok, MD;  Location: [facility];  Service: General;  Laterality: N/A;  . COLONOSCOPY  09/05/2012   Procedure: COLONOSCOPY;  Surgeon: Beryle Beams, MD;  Location: [facility];  Service: Endoscopy;  Laterality: N/A;  . COLONOSCOPY WITH PROPOFOL N/A 09/02/2015   Procedure: COLONOSCOPY WITH PROPOFOL;  Surgeon: Carol Ada, MD;  Location: [facility];  Service: Endoscopy;  Laterality: N/A;  . ESOPHAGOGASTRODUODENOSCOPY (EGD) WITH PROPOFOL N/A 03/29/2017   Procedure: ESOPHAGOGASTRODUODENOSCOPY (EGD) WITH PROPOFOL;  Surgeon: Carol Ada, MD;  Location: [facility];  Service: Endoscopy;  Laterality: N/A;  . TUBAL LIGATION       OB History   None      Home Medications    Prior to Admission medications   Medication Sig Start Date End Date Taking? Authorizing Provider  albuterol (PROVENTIL) (2.5 MG/3ML) 0.083% nebulizer solution Take 3 mLs (2.5 mg total) by nebulization 4 (four) times daily as needed for wheezing or shortness of breath. 12/16/17   Baird Lyons D, MD  allopurinol (ZYLOPRIM) 100 MG tablet Take 100 mg by mouth daily.    [provider]  ALPRAZolam Duanne Moron) 1 MG tablet Take 1 mg by mouth at bedtime as needed for sleep.  04/13/16   [provider]  aspirin EC 81 MG tablet Take 81 mg by mouth daily.    [provider]  budesonide-formoterol (SYMBICORT) 160-4.5 MCG/ACT inhaler Inhale 2 puffs into the lungs 2 (two) times daily. 05/03/15   Collene Gobble, MD  Cholecalciferol (VITAMIN D PO) Take 1 tablet by mouth daily.    [provider]  clobetasol cream (TEMOVATE) 8.34 % Apply 1 application topically daily as needed (irritation from pull ups).  03/25/17   [provider]  fluconazole (DIFLUCAN) 150 MG tablet Take 150 mg by mouth daily. 11/26/17   [provider]  fluticasone (FLONASE) 50 MCG/ACT nasal spray Place 1 spray into both nostrils daily as needed (congestion). 08/26/17   Aline August, MD  furosemide (LASIX) 40 MG tablet Take 1 tablet (40 mg total) by mouth daily. 12/05/16   Arbutus Leas, NP  glipiZIDE (GLUCOTROL) 10 MG tablet Take 10 mg by mouth 2 (two) times daily before a meal.     [provider]  guaiFENesin (MUCINEX) 600 MG 12 hr tablet Take 1 tablet (600 mg total) by mouth 2 (two) times daily. 08/26/17   Aline August, MD  HYDROcodone-acetaminophen (NORCO) 7.5-325 MG tablet Take 1 tablet by mouth every 6 (six) hours as needed for moderate pain.    [provider]  insulin glargine (LANTUS) 100 unit/mL SOPN Inject 18 Units into the skin at bedtime.     [provider]  Linaclotide Rolan Lipa) 145 MCG CAPS capsule Take 145 mcg by  mouth daily.     [provider]  loratadine (CLARITIN) 10 MG tablet Take 10 mg by mouth daily.    [provider]  metoprolol succinate (TOPROL-XL) 25 MG 24 hr tablet Take 2 tablets (50 mg total) by mouth daily. 08/26/17   Aline August, MD  metoprolol succinate (TOPROL-XL) 50 MG 24 hr tablet Take 50 mg by mouth daily. Take with or immediately following a meal.    [provider]  Naphazoline HCl (CLEAR EYES OP) Place 1 drop into both eyes daily.    [provider]  ondansetron (ZOFRAN ODT) 4 MG disintegrating tablet Take 1 tablet (4 mg total) by mouth every 8 (eight) hours as needed for nausea or vomiting. 03/01/17   Blanchie Dessert, MD  OXYGEN Inhale 1-2 L into the lungs continuous. 1 L at night and 2 L during the day    [provider]  pantoprazole (PROTONIX) 40 MG tablet Take 1 tablet (40 mg total) by mouth daily. 08/26/17   Aline August, MD  Potassium Chloride ER 20 MEQ TBCR Take 20 mEq by mouth daily. 12/04/16   Arbutus Leas, NP  pravastatin (PRAVACHOL) 20 MG tablet Take 20 mg by mouth every evening.     [provider]  predniSONE (DELTASONE) 10 MG tablet 4 tabs for 2 days, then 3 tabs for 2 days, 2 tabs for 2 days, then 1 tab for 2 days, then stop 01/21/18   Parrett, Tammy S, NP  predniSONE (DELTASONE) 10 MG tablet Take 2 tablets (20 mg total) by mouth daily. 02/08/18   Valarie Merino, MD  predniSONE (DELTASONE) 20 MG tablet Take 2 tablets (40 mg total) by mouth daily. Patient not taking: Reported on 01/21/2018 12/01/17   Recardo Evangelist, PA-C  PROAIR HFA [phone] Base) MCG/ACT inhaler INHALE 2 PUFFS BY MOUTH EVERY 4 HOURS AS NEEDED FOR WHEEZING 06/14/16   Collene Gobble, MD  ranitidine (ZANTAC 75) 75 MG tablet Take 1 tablet (75 mg total) 2 (two) times daily by mouth. 09/18/17   Lyda Jester M, PA-C  sitaGLIPtin (JANUVIA) 100 MG tablet Take 100 mg by mouth daily.    [provider]  spironolactone (ALDACTONE) 25 MG tablet Take 0.5 tablets (12.5 mg total) by mouth daily. 12/05/16   Arbutus Leas, NP  tiotropium (SPIRIVA) 18 MCG inhalation capsule Place 1 capsule (18 mcg total) into inhaler and inhale daily. 05/03/15   Collene Gobble, MD  zolpidem (AMBIEN) 10 MG tablet Take 10 mg by mouth at bedtime.    [provider]    Family History Family History  Problem Relation Age of Onset  . Other Father        unaware of father's medical history  . Diabetes Mother        alive @ 59  . Myasthenia gravis Mother   . Lung cancer Paternal Aunt   . Lung cancer Paternal Grandfather   . Other Unknown        multiple siblings a&w.  . Heart attack Neg Hx   . Heart failure Neg Hx     Social History Social History   Tobacco Use  . Smoking status: Former Smoker    Packs/day: 0.50    Years: 36.00    Pack years: 18.00    Types: Cigarettes    Last attempt to quit: 11/11/2015    Years since quitting: 2.2  . Smokeless tobacco: Never Used  . Tobacco comment: Approx 90 pk-yrs (up to  3ppd  until ~ 2009). Smoking 3 cigs per day now.  Substance Use Topics  . Alcohol use: No    Alcohol/week: 0.0 oz  . Drug use: No     Allergies   Patient has no known allergies.   Review of Systems Review of Systems  Constitutional: Negative for fever.  Respiratory: Positive for shortness of breath and wheezing.   Musculoskeletal: Positive for back pain.  All other systems reviewed and are negative.    Physical Exam Updated Vital Signs BP 133/77 (BP Location: Left Arm)   Pulse 79   Temp 98.1 F (36.7 C) (Oral)   LMP 06/04/2000 (LMP Unknown)   SpO2 93%   Physical Exam  Constitutional: She is oriented to person, place, and time. She appears well-developed and well-nourished. No distress.  HENT:  Head: Normocephalic and atraumatic.  Mouth/Throat: Oropharynx is clear and moist.  Eyes: Pupils are equal, round, and reactive to light. Conjunctivae and EOM are normal.  Neck: Normal range of motion. Neck supple.  Cardiovascular: Normal rate, regular rhythm and normal heart sounds.  Pulmonary/Chest: Effort normal and breath sounds normal. No respiratory distress.  Mild wheezing to bilateral expiratory lung fields   Abdominal: Soft. She exhibits no distension. There is no tenderness.  Musculoskeletal: Normal range of motion. She exhibits no edema or deformity.  Neurological: She is alert and oriented to person, place, and time.  Skin: Skin is warm and dry.  Psychiatric: She has a normal mood and affect.  Nursing note and vitals reviewed.    ED Treatments / Results  Labs (all labs ordered are listed, but only abnormal results are displayed) Labs Reviewed  BASIC METABOLIC PANEL - Abnormal; Notable for the following components:      Result Value   BUN 5 (*)    All other components within normal limits  CBC WITH DIFFERENTIAL/PLATELET    EKG EKG Interpretation  Date/Time:  Saturday February 08 2018 20:29:50 EDT Ventricular Rate:  70 PR Interval:    QRS Duration: 81 QT  Interval:  421 QTC Calculation: 455 R Axis:   70 Text Interpretation:  Sinus rhythm Confirmed by Dene Gentry [phone]) on 02/08/2018 8:32:17 PM   Radiology Dg Chest 2 View  Result Date: 02/08/2018 CLINICAL DATA:  Dyspnea on exertion.  Back pain. EXAM: CHEST - 2 VIEW COMPARISON:  12/01/2017 FINDINGS: Cardiomediastinal silhouette is normal. Mediastinal contours appear intact. Calcific atherosclerotic disease of the aorta. There is no evidence of focal airspace consolidation, pleural effusion or pneumothorax. Possible mild pulmonary vascular congestion. 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 Soft tissues are grossly normal. IMPRESSION: Calcific atherosclerotic disease of the aorta. Possible mild pulmonary vascular congestion. Electronically Signed   By: Fidela Salisbury M.D.   On: 02/08/2018 20:05    Procedures Procedures (including critical care time)  Medications Ordered in ED Medications  ipratropium-albuterol (DUONEB) 0.5-2.5 (3) MG/3ML nebulizer solution 3 mL (3 mLs Nebulization Given 02/08/18 2118)  predniSONE (DELTASONE) tablet 60 mg (60 mg Oral Given 02/08/18 2118)     Initial Impression / Assessment and Plan / ED Course  I have reviewed the triage vital signs and the nursing notes.  Pertinent labs & imaging results that were available during my care of the patient were reviewed by me and considered in my medical decision making (see chart for details).     MDM  Screen complete  Patient with mild bronchospasm.  Screening chest x-ray, EKG, and baseline labs do not suggest other acute pathology.  Patient feels significantly improved following DuoNeb treatment in the ED.  No overt evidence of an infectious process is found.  Will provide patient with a short course of steroids for home treatment. Patient understands need for close follow-up with her regular pulmonary care.  Strict return precautions given and understood.   Final Clinical  Impressions(s) / ED Diagnoses   Final diagnoses:  Bronchospasm    ED Discharge Orders        Ordered    predniSONE (DELTASONE) 10 MG tablet  Daily     02/08/18 2146       Valarie Merino, MD 02/08/18 2153

## 2018-02-08 NOTE — ED Notes (Signed)
Patient transported to X-ray 

## 2018-02-08 NOTE — ED Notes (Signed)
Pt discharged from ED; instructions provided and scripts given; Pt encouraged to return to ED if symptoms worsen and to f/u with PCP; Pt verbalized understanding of all instructions 

## 2018-02-08 NOTE — ED Notes (Signed)
ED Provider at bedside. 

## 2018-02-08 NOTE — ED Triage Notes (Signed)
Pt arrives from home via [facility] reporting dyspnea with exertion and back pain radiating to lower bilat abd x 1 week.  Pt reports hx COPD, lives on 2.5 L.  Pt becomes SOB while talking.  Pt denies fevers, change to chronic cough. AOx4.

## 2018-02-11 ENCOUNTER — Telehealth: Payer: Self-pay | Admitting: Emergency Medicine

## 2018-02-11 NOTE — Telephone Encounter (Signed)
Called and spoke to patient. Patient is concerned about the [facility] bus leaving her.  Patient stated that she has already called to schedule [facility] bus so even if the MD wants to do the x-ray prior to her appointment she isn't going to be able to get here any quicker. Spoke with Ria Comment, Dr. Agustina Caroli CMA who advised that [name] doesn't typically do this. Patient stated that she is worried if he orders a chest x-ray to be done after the visit that it will take too long and she will miss the [facility] bus and be stuck here till the evening.  Routing to [place] and [name] as FYI for pt visit on 02/12/18.

## 2018-02-12 ENCOUNTER — Encounter: Payer: Self-pay | Admitting: Emergency Medicine

## 2018-02-12 ENCOUNTER — Ambulatory Visit: Payer: Medicaid Other | Admitting: Emergency Medicine

## 2018-02-12 DIAGNOSIS — R0602 Shortness of breath: Secondary | ICD-10-CM | POA: Diagnosis not present

## 2018-02-12 DIAGNOSIS — J441 Chronic obstructive pulmonary disease with (acute) exacerbation: Secondary | ICD-10-CM

## 2018-02-12 NOTE — Assessment & Plan Note (Signed)
No wheeze on exam today (on pred). Finish the pred taper, continue her maintenance BD's and O2. Follow soon. ? Whether she needs an expanded w/u for progressive dyspnea.

## 2018-02-12 NOTE — Assessment & Plan Note (Signed)
Acute to subacute dyspnea. She has been treated with prednisone at the ED, ? Whether there is a component of volume overload here based on her wt, her CXR from 3/30. I will temporarily increase her lasix to see if she benefits. Follow her soon to see if we need to expand her w/u

## 2018-02-12 NOTE — Patient Instructions (Addendum)
Please finish your prednisone as prescribed from the ED Please increase your lasix to twice a day for the next 3 days, then go back to once a day.  Please continue your Spiriva and Symbicort as you are taking them  Please continue loratadine once a day Continue your nasal saline spray We may need to restart your flonase nasal spray if you are able tolerate.  Continue your oxygen as you are using it.  Follow with Dr Lamonte Sakai or APP in 2 weeks to assess your improvement.

## 2018-02-12 NOTE — Progress Notes (Signed)
Subjective:    Patient ID: Stephanie Schultz, female    DOB: 04-24-1956, 62 y.o.   MRN: [id number]  ROV 01/09/16 -- patient has a history of COPD with a positive bronchodilator response, upper airway irritation syndrome and chronic cough. She also has sleep apnea and is on CPAP. She was last seen in our office by TP for an acute exacerbation. She is currently managed on Symbicort and Spiriva. Supplemental oxygen at 2.5 L/m. She is doing well > . She is using saline washes, she is off nasal spray due to epistaxis, on loratadine. She has stayed off cigarettes since her GB surgery. Her breathing has clearly benefited. She is taking spiriva and symbicort reliably. She is wearing CPAP, but states that she has a HA daily.   ROV 09/24/17 --this is a follow-up visit for patient with a history of severe COPD with a positive bronchodilator response, chronic cough due to this and upper airway irritation syndrome.  She also has obstructive sleep apnea, hypertension with diastolic dysfunction.  I have not seen her for almost 2 years - she has followed with Dr Camillo Flaming at [facility].  She was hospitalized mid October with possible pneumonia versus cardiogenic pulmonary edema. She has improved since the hospitalization, completed a pred taper. Her dyspnea seems to correlate with her wt and fluid status. She ate Lays potato chips yesterday and has gained 3 lbs, feels more SOB. She is on 2.5L/min at rest, up to 3l/min w walking. She is using Symbicort and Spiriva. She uses albuterol more than 4 x a day on many days. She is on loratadine, flonase only prn. She had a PSG in April 2018 that showed she could come off CPAP - no longer using.   Acute OV 02/12/18 --patient has a history of obesity, severe COPD with a positive bronchodilator response, chronic cough and upper airway irritation syndrome.  She has obstructive sleep apnea, hypertension with diastolic dysfunction and history of diastolic CHF.  She is no longer on CPAP based on a  reassuring sleep study from April 2018.  She was seen 1 month ago here with some increased dyspnea and was treated for an acute exacerbation with antibiotics and prednisone.  She reports that she benefited from this, was doing well until 12 days ago when she had more exertional SOB some rib and flank pain, R subscapular pain. Also some wheeze.  She was seen in the emergency department 3/30 for dyspnea and exertional hypoxemia. A chest x-ray done 02/08/18 showed some evidence for possible mild pulmonary vascular congestion. She was given pred for 5 days with some improvement but not back to baseline. BP has been well controlled. She may have some increased LE edema, has gained 3 lbs. She is using o2 at 2.5-3 L/min. Takes loratadine, has nasal congestion. Difficulty taking flonase due to nasal bleeding.   ROS  See history of present illness   Objective:   Physical Exam Vitals:   02/12/18 1535  BP: 124/80  Pulse: 74  SpO2: 93%  Weight: 225 lb (102.1 kg)  Height: 5' (1.524 m)    Gen: Obese, in no distress,  normal affect  ENT: No lesions,  mouth clear,  oropharynx clear, no postnasal drip, strong voice today  Neck: No JVD, no stridor  Lungs: No use of accessory muscles, distant, no wheezes   Cardiovascular: RRR, heart sounds normal, no murmur or gallops, no peripheral edema  Musculoskeletal: No deformities, no cyanosis or clubbing  Neuro: alert, non focal  Skin: Warm, no  lesions or rashes, old scars on B UE's   Assessment & Plan:   Dyspnea Acute to subacute dyspnea. She has been treated with prednisone at the ED, ? Whether there is a component of volume overload here based on her wt, her CXR from 3/30. I will temporarily increase her lasix to see if she benefits. Follow her soon to see if we need to expand her w/u  COPD (chronic obstructive pulmonary disease) (HCC) No wheeze on exam today (on pred). Finish the pred taper, continue her maintenance BD's and O2. Follow soon. ? Whether  she needs an expanded w/u for progressive dyspnea.   [name], MD, PhD 02/12/2018, 4:10 PM [facility] [phone] or if no answer [phone]

## 2018-02-21 ENCOUNTER — Encounter: Payer: Self-pay | Admitting: Emergency Medicine

## 2018-02-21 ENCOUNTER — Other Ambulatory Visit (INDEPENDENT_AMBULATORY_CARE_PROVIDER_SITE_OTHER): Payer: Medicaid Other

## 2018-02-21 ENCOUNTER — Ambulatory Visit: Payer: Medicaid Other | Admitting: Emergency Medicine

## 2018-02-21 DIAGNOSIS — G4733 Obstructive sleep apnea (adult) (pediatric): Secondary | ICD-10-CM

## 2018-02-21 DIAGNOSIS — J441 Chronic obstructive pulmonary disease with (acute) exacerbation: Secondary | ICD-10-CM | POA: Diagnosis not present

## 2018-02-21 DIAGNOSIS — I5033 Acute on chronic diastolic (congestive) heart failure: Secondary | ICD-10-CM

## 2018-02-21 LAB — BASIC METABOLIC PANEL
BUN: 14 mg/dL (ref 6–23)
CALCIUM: 9.8 mg/dL (ref 8.4–10.5)
CO2: 33 meq/L — AB (ref 19–32)
Chloride: 102 mEq/L (ref 96–112)
Creatinine, Ser: 1.17 mg/dL (ref 0.40–1.20)
GFR: 60.79 mL/min (ref >60.00)
GLUCOSE: 125 mg/dL — AB (ref 70–99)
Potassium: 4.2 mEq/L (ref 3.5–5.1)
SODIUM: 143 meq/L (ref 135–145)

## 2018-02-21 MED ORDER — FUROSEMIDE 40 MG PO TABS: ORAL_TABLET | ORAL | 0 refills | Status: DC

## 2018-02-21 NOTE — Patient Instructions (Addendum)
Please increase Lasix to 40 mg in the morning, 20 mg in the afternoon. Continue Spiriva and Symbicort as you have been taking them. Continue loratadine 10 mg once daily. Continue your nasal saline spray Continue omeprazole as you have been taking it We will perform a split night sleep study.  You need to have lab work here and follow with APP here to review and adjust your lasix if needed.  We need to get you a visit with Dr Radford Pax or B. Simmons in cardiology since we are adjusting your lasix.  Follow with Dr Lamonte Sakai after your sleep study to review the results together.

## 2018-02-21 NOTE — Assessment & Plan Note (Signed)
Continue Spiriva and Symbicort as you have been taking them.

## 2018-02-21 NOTE — Assessment & Plan Note (Signed)
Her CPAP was stopped last year after an outside polysomnogram.  Not clear to me that she does not still need treatment for OSA.  This may be one reason why her dCHF is harder to manage (although I know that she has significant dietary noncompliance).  I will arrange for a split-night sleep study to confirm that she does not have treatable OSA.

## 2018-02-21 NOTE — Progress Notes (Signed)
Subjective:    Patient ID: Stephanie Schultz, female    DOB: 20-Sep-1956, 62 y.o.   MRN: [id number]  ROV 01/09/16 -- patient has a history of COPD with a positive bronchodilator response, upper airway irritation syndrome and chronic cough. She also has sleep apnea and is on CPAP. She was last seen in our office by TP for an acute exacerbation. She is currently managed on Symbicort and Spiriva. Supplemental oxygen at 2.5 L/m. She is doing well > . She is using saline washes, she is off nasal spray due to epistaxis, on loratadine. She has stayed off cigarettes since her GB surgery. Her breathing has clearly benefited. She is taking spiriva and symbicort reliably. She is wearing CPAP, but states that she has a HA daily.   ROV 09/24/17 --this is a follow-up visit for patient with a history of severe COPD with a positive bronchodilator response, chronic cough due to this and upper airway irritation syndrome.  She also has obstructive sleep apnea, hypertension with diastolic dysfunction.  I have not seen her for almost 2 years - she has followed with Dr Camillo Flaming at [facility].  She was hospitalized mid October with possible pneumonia versus cardiogenic pulmonary edema. She has improved since the hospitalization, completed a pred taper. Her dyspnea seems to correlate with her wt and fluid status. She ate Lays potato chips yesterday and has gained 3 lbs, feels more SOB. She is on 2.5L/min at rest, up to 3l/min w walking. She is using Symbicort and Spiriva. She uses albuterol more than 4 x a day on many days. She is on loratadine, flonase only prn. She had a PSG in April 2018 that showed she could come off CPAP - no longer using.   Acute OV 02/12/18 --patient has a history of obesity, severe COPD with a positive bronchodilator response, chronic cough and upper airway irritation syndrome.  She has obstructive sleep apnea, hypertension with diastolic dysfunction and history of diastolic CHF.  She is no longer on CPAP based on a  reassuring sleep study from April 2018.  She was seen 1 month ago here with some increased dyspnea and was treated for an acute exacerbation with antibiotics and prednisone.  She reports that she benefited from this, was doing well until 12 days ago when she had more exertional SOB some rib and flank pain, R subscapular pain. Also some wheeze.  She was seen in the emergency department 3/30 for dyspnea and exertional hypoxemia. A chest x-ray done 02/08/18 showed some evidence for possible mild pulmonary vascular congestion. She was given pred for 5 days with some improvement but not back to baseline. BP has been well controlled. She may have some increased LE edema, has gained 3 lbs. She is using o2 at 2.5-3 L/min. Takes loratadine, has nasal congestion. Difficulty taking flonase due to nasal bleeding.   ROV 02/21/18 --follow-up visit for obese woman with a history of COPD and a positive bronchodilator response, upper airway irritation syndrome with chronic cough hypertension with diastolic dysfunction,.  She had obstructive sleep apnea and was on CPAP but this was discontinued after a reassuring sleep study a year ago (elsewhere).  She has been having difficulty for the last 1-2 months with increased cough, wheeze.  She has been treated with prednisone twice over that time.  I saw her 9 days ago and increased her Lasix for 3 days.  She believes that the lasix helped her breathing. She is managed with Spiriva, Symbicort, loratadine, nasal saline spray.  She has  not tolerated Flonase due to nose bleeding. She returns today reporting that she has improved some. She is experiencing chronic hoarseness. She experienced an acute episode dyspnea 4/11, took xanax and felt that it helped her. She is on omeprazole (med list says protonix) has breakthrough GERD sx. She is having frequent diarrhea.  She still describes sleep disturbance, snoring.    ROS  See history of present illness   Objective:   Physical Exam Vitals:    02/21/18 0955  BP: 128/82  Pulse: 68  SpO2: 93%  Weight: 221 lb (100.2 kg)  Height: 5' (1.524 m)    Gen: Obese, in no distress,  normal affect  ENT: No lesions,  mouth clear,  oropharynx clear, no postnasal drip, strong voice today  Neck: No JVD, no stridor  Lungs: No use of accessory muscles, distant, no wheezes   Cardiovascular: RRR, heart sounds normal, no murmur or gallops, no peripheral edema  Musculoskeletal: No deformities, no cyanosis or clubbing  Neuro: alert, non focal  Skin: Warm, no lesions or rashes, old scars on B UE's   Assessment & Plan:   COPD (chronic obstructive pulmonary disease) (HCC) Continue Spiriva and Symbicort as you have been taking them.   Acute on chronic diastolic CHF (congestive heart failure) (HCC) Her lower extremity edema is improved, her dyspnea improved when we gave her a brief increase in Lasix.  I think will be reasonable to try to increase slightly as below.  Check BMP today and have her follow-up here to review.  I also think she needs to see cardiology soon.  Possible that stopping therapy for her sleep apnea has made her volume status hard to manage.  I suspect also a significant contribution from dietary noncompliance.  Please increase Lasix to 40 mg in the morning, 20 mg in the afternoon. You need to have lab work here and follow with APP here to review and adjust your lasix if needed.  We need to get you a visit with Dr Radford Pax or B. Simmons in cardiology since we are adjusting your lasix.   Obstructive sleep apnea Her CPAP was stopped last year after an outside polysomnogram.  Not clear to me that she does not still need treatment for OSA.  This may be one reason why her dCHF is harder to manage (although I know that she has significant dietary noncompliance).  I will arrange for a split-night sleep study to confirm that she does not have treatable OSA.  [name], MD, PhD 02/21/2018, 10:27 AM [facility] [phone] or if no answer [phone]

## 2018-02-21 NOTE — Addendum Note (Signed)
Addended by: Desmond Dike C on: 02/21/2018 10:41 AM   Modules accepted: Orders

## 2018-02-21 NOTE — Addendum Note (Signed)
Addended by: Desmond Dike C on: 02/21/2018 10:38 AM   Modules accepted: Orders

## 2018-02-21 NOTE — Assessment & Plan Note (Addendum)
Her lower extremity edema is improved, her dyspnea improved when we gave her a brief increase in Lasix.  I think will be reasonable to try to increase slightly as below.  Check BMP today and have her follow-up here to review.  I also think she needs to see cardiology soon.  Possible that stopping therapy for her sleep apnea has made her volume status hard to manage.  I suspect also a significant contribution from dietary noncompliance.  Please increase Lasix to 40 mg in the morning, 20 mg in the afternoon. You need to have lab work here and follow with APP here to review and adjust your lasix if needed.  We need to get you a visit with Dr Radford Pax or B. Simmons in cardiology since we are adjusting your lasix.

## 2018-02-26 ENCOUNTER — Ambulatory Visit: Payer: Medicaid Other | Admitting: Adult Health

## 2018-02-27 ENCOUNTER — Ambulatory Visit: Payer: Medicaid Other | Admitting: Adult Health

## 2018-03-14 ENCOUNTER — Emergency Department (HOSPITAL_COMMUNITY): Payer: Medicaid Other

## 2018-03-14 ENCOUNTER — Encounter (HOSPITAL_COMMUNITY): Payer: Self-pay | Admitting: Emergency Medicine

## 2018-03-14 ENCOUNTER — Emergency Department (HOSPITAL_COMMUNITY)
Admission: EM | Admit: 2018-03-14 | Discharge: 2018-03-15 | Disposition: A | Discharge status: Home / Self Care | Payer: Medicaid Other | Attending: Emergency Medicine | Admitting: Emergency Medicine

## 2018-03-14 ENCOUNTER — Other Ambulatory Visit: Payer: Self-pay

## 2018-03-14 DIAGNOSIS — Z794 Long term (current) use of insulin: Secondary | ICD-10-CM | POA: Insufficient documentation

## 2018-03-14 DIAGNOSIS — I5032 Chronic diastolic (congestive) heart failure: Secondary | ICD-10-CM | POA: Diagnosis not present

## 2018-03-14 DIAGNOSIS — R51 Headache: Secondary | ICD-10-CM | POA: Diagnosis not present

## 2018-03-14 DIAGNOSIS — Z7982 Long term (current) use of aspirin: Secondary | ICD-10-CM | POA: Insufficient documentation

## 2018-03-14 DIAGNOSIS — Z79899 Other long term (current) drug therapy: Secondary | ICD-10-CM | POA: Insufficient documentation

## 2018-03-14 DIAGNOSIS — I110 Hypertensive heart disease with heart failure: Secondary | ICD-10-CM | POA: Insufficient documentation

## 2018-03-14 DIAGNOSIS — J441 Chronic obstructive pulmonary disease with (acute) exacerbation: Secondary | ICD-10-CM | POA: Diagnosis not present

## 2018-03-14 DIAGNOSIS — E1140 Type 2 diabetes mellitus with diabetic neuropathy, unspecified: Secondary | ICD-10-CM | POA: Diagnosis not present

## 2018-03-14 DIAGNOSIS — Z87891 Personal history of nicotine dependence: Secondary | ICD-10-CM | POA: Diagnosis not present

## 2018-03-14 DIAGNOSIS — R0602 Shortness of breath: Secondary | ICD-10-CM | POA: Diagnosis present

## 2018-03-14 LAB — COMPREHENSIVE METABOLIC PANEL
ALT: 31 U/L (ref 14–54)
AST: 25 U/L (ref 15–41)
Albumin: 4 g/dL (ref 3.5–5.0)
Alkaline Phosphatase: 37 U/L — ABNORMAL LOW (ref 38–126)
Anion gap: 10 (ref 5–15)
BILIRUBIN TOTAL: 0.3 mg/dL (ref 0.3–1.2)
BUN: 13 mg/dL (ref 6–20)
CHLORIDE: 101 mmol/L (ref 101–111)
CO2: 28 mmol/L (ref 22–32)
Calcium: 9.2 mg/dL (ref 8.9–10.3)
Creatinine, Ser: 1.07 mg/dL — ABNORMAL HIGH (ref 0.44–1.00)
GFR, EST NON AFRICAN AMERICAN: 56 mL/min — AB (ref >60)
Glucose, Bld: 124 mg/dL — ABNORMAL HIGH (ref 65–99)
Potassium: 3.8 mmol/L (ref 3.5–5.1)
Sodium: 139 mmol/L (ref 135–145)
TOTAL PROTEIN: 7.6 g/dL (ref 6.5–8.1)

## 2018-03-14 LAB — CBC
HCT: 36.8 % (ref 36.0–46.0)
Hemoglobin: 12.5 g/dL (ref 12.0–15.0)
MCH: 28.3 pg (ref 26.0–34.0)
MCHC: 34 g/dL (ref 30.0–36.0)
MCV: 83.4 fL (ref 78.0–100.0)
PLATELETS: 219 10*3/uL (ref 150–400)
RBC: 4.41 MIL/uL (ref 3.87–5.11)
RDW: 14.7 % (ref 11.5–15.5)
WBC: 6.8 10*3/uL (ref 4.0–10.5)

## 2018-03-14 LAB — I-STAT TROPONIN, ED: Troponin i, poc: 0 ng/mL (ref 0.00–0.08)

## 2018-03-14 LAB — I-STAT BETA HCG BLOOD, ED (MC, WL, AP ONLY): I-stat hCG, quantitative: <5 m[IU]/mL (ref <5)

## 2018-03-14 LAB — LIPASE, BLOOD: LIPASE: 25 U/L (ref 11–51)

## 2018-03-14 MED ORDER — ACETAMINOPHEN 500 MG PO TABS
1000.0000 mg | ORAL_TABLET | Freq: Once | ORAL | Status: AC
  Administered 2018-03-14: 1000 mg via ORAL
  Filled 2018-03-14: qty 2

## 2018-03-14 MED ORDER — ALBUTEROL SULFATE (2.5 MG/3ML) 0.083% IN NEBU
5.0000 mg | INHALATION_SOLUTION | Freq: Once | RESPIRATORY_TRACT | Status: AC
  Administered 2018-03-14: 5 mg via RESPIRATORY_TRACT
  Filled 2018-03-14: qty 6

## 2018-03-14 MED ORDER — METHYLPREDNISOLONE SODIUM SUCC 125 MG IJ SOLR
125.0000 mg | Freq: Once | INTRAMUSCULAR | Status: AC
  Administered 2018-03-14: 125 mg via INTRAVENOUS
  Filled 2018-03-14: qty 2

## 2018-03-14 NOTE — ED Notes (Signed)
Pt to radiology.

## 2018-03-14 NOTE — ED Triage Notes (Signed)
Pt to ED via [facility]> reports intermittent sob with exertion x 3 weeks.  Wears 2.5 liters of O2 constantly at home.  States she was told she had "fluid around her heart".  Also reports intermittent cramping to LUQ and diarrhea- known hernia. Reports severe headache since waking up this morning- moves from back of head to front- no history of headaches.  Also reports coughing up green sputum for 2 weeks.

## 2018-03-14 NOTE — ED Notes (Signed)
Pt reports needing to use the bathroom.  Still c/o 10/10 headache.  Purewick placed to suction.  Pt requested blanket for back of her neck.  States she had rather have blanket than pillow.  Daughter and granddaughter at bedside.

## 2018-03-14 NOTE — ED Notes (Signed)
Pt unable to void yet.

## 2018-03-14 NOTE — ED Provider Notes (Signed)
[facility] EMERGENCY DEPARTMENT Provider Note   CSN: [id number] Arrival date & time: 03/14/18  2136     History   Chief Complaint Chief Complaint  Patient presents with  . Shortness of Breath  . Abdominal Pain  . Headache    HPI Stephanie Schultz is a 62 y.o. female.  HPI Patient presents with concern of dyspnea, fatigue. Patient has multiple medical issues including CHF, COPD, is on oxygen, and has used it for 7 years. She has seen her pulmonologist recently, and recently started taking additional Lasix dosing, now twice daily dosing of Lasix with evening dosing of spironolactone. She denies recent weight changes, states that her weight is been stable. She notes that over the past 3 weeks, she has had worsening dyspnea, without chest pain, without syncope. No fever, no cough. She is here with family members assist with the HPI.  Past Medical History:  Diagnosis Date  . Anemia    chronis  . Anxiety   . Arthritis   . Cervical cancer ([place]) 1990   cervical   . Chronic diastolic CHF (congestive heart failure) ([place]) 08/10/2016  . COPD (chronic obstructive pulmonary disease) ([place])    O2 dependent. Pulmo: [name]  . Diabetes mellitus without complication ([place])    a. A1c 8.3 in 11/2015  . Emphysema   . Gallstones    s/p cholecystectomy  . GERD (gastroesophageal reflux disease)   . Gout   . History of home oxygen therapy    "2.5L; 24/7" (08/10/2016)  . Hx of cardiac catheterization    a. LHC at [facility] in [place], [place] 09/2008:  Normal coronary arteries EF 70%. b. LHC 11/2016: normal cors, normal LVEDP, EF 55-65%.  . Hyperlipidemia   . Hypertension   . Morbid obesity ([place])   . OSA on CPAP    CPAP at night   . Pneumonia   . Sickle cell trait ([place])   . Supplemental oxygen dependent    2L CONTINUOUSLEY  . Tobacco abuse    a. up to 3ppd from age 23 to 67, now 1/4 ppd (01/2013) >> Quit 10/2015    Patient Active Problem List   Diagnosis Date Noted  .  Dyspnea 08/23/2017  . Acute on chronic diastolic CHF (congestive heart failure) ([place]) 08/10/2016  . Acute respiratory failure ([place]) 07/20/2016  . S/P laparoscopic cholecystectomy 12/13/2015  . Chronic respiratory failure ([place]) 03/31/2015  . Rib fracture 03/31/2015  . Acute respiratory failure with hypoxia ([place]) 02/17/2015  . Arthritis 12/13/2014  . Gout 03/03/2014  . Diabetic neuropathy ([place]) 03/03/2014  . Carpal tunnel syndrome 03/03/2014  . DM type 2 (diabetes mellitus, type 2) ([place]) 05/22/2013  . Anxiety 05/22/2013  . Transaminasemia 05/09/2013  . Leukocytosis 04/27/2013  . Tobacco abuse   . Chest pain   . Sickle cell trait ([place])   . Morbid obesity ([place])   . Acute on chronic respiratory failure ([place]) 01/16/2013  . Diastolic dysfunction 26/94/8546  . Hoarseness 12/22/2012  . Epistaxis, recurrent 07/08/2012  . GERD (gastroesophageal reflux disease) 09/20/2011  . Pulmonary nodule 09/20/2011  . COPD (chronic obstructive pulmonary disease) ([place]) 04/04/2011  . Diabetes mellitus type 2, controlled ([place]) 04/04/2011  . Hypertension 04/04/2011  . Obstructive sleep apnea 04/04/2011  . Chronic allergic rhinitis 04/04/2011    Past Surgical History:  Procedure Laterality Date  . BREAST BIOPSY    . CARDIAC CATHETERIZATION    . CARDIAC CATHETERIZATION N/A 12/03/2016   Procedure: Left Heart Cath and Coronary Angiography;  Surgeon: Peter M Martinique, MD;  Location: [facility];  Service: Cardiovascular;  Laterality: N/A;  . CHOLECYSTECTOMY N/A 12/13/2015   Procedure: LAPAROSCOPIC CHOLECYSTECTOMY;  Surgeon: Ralene Ok, MD;  Location: [facility];  Service: General;  Laterality: N/A;  . COLONOSCOPY  09/05/2012   Procedure: COLONOSCOPY;  Surgeon: Beryle Beams, MD;  Location: [facility];  Service: Endoscopy;  Laterality: N/A;  . COLONOSCOPY WITH PROPOFOL N/A 09/02/2015   Procedure: COLONOSCOPY WITH PROPOFOL;  Surgeon: Carol Ada, MD;  Location: [facility];  Service: Endoscopy;   Laterality: N/A;  . ESOPHAGOGASTRODUODENOSCOPY (EGD) WITH PROPOFOL N/A 03/29/2017   Procedure: ESOPHAGOGASTRODUODENOSCOPY (EGD) WITH PROPOFOL;  Surgeon: Carol Ada, MD;  Location: [facility];  Service: Endoscopy;  Laterality: N/A;  . TUBAL LIGATION       OB History   None      Home Medications    Prior to Admission medications   Medication Sig Start Date End Date Taking? Authorizing Provider  albuterol (PROVENTIL) (2.5 MG/3ML) 0.083% nebulizer solution Take 3 mLs (2.5 mg total) by nebulization 4 (four) times daily as needed for wheezing or shortness of breath. 12/16/17   Baird Lyons D, MD  allopurinol (ZYLOPRIM) 100 MG tablet Take 100 mg by mouth daily.    [provider]  ALPRAZolam Duanne Moron) 1 MG tablet Take 1 mg by mouth at bedtime as needed for sleep.  04/13/16   [provider]  aspirin EC 81 MG tablet Take 81 mg by mouth daily.    [provider]  budesonide-formoterol (SYMBICORT) 160-4.5 MCG/ACT inhaler Inhale 2 puffs into the lungs 2 (two) times daily. 05/03/15   Collene Gobble, MD  Cholecalciferol (VITAMIN D PO) Take 1 tablet by mouth daily.    [provider]  clobetasol cream (TEMOVATE) 2.99 % Apply 1 application topically daily as needed (irritation from pull ups).  03/25/17   [provider]  fluconazole (DIFLUCAN) 150 MG tablet Take 150 mg by mouth daily. 11/26/17   [provider]  fluticasone (FLONASE) 50 MCG/ACT nasal spray Place 1 spray into both nostrils daily as needed (congestion). 08/26/17   Aline August, MD  furosemide (LASIX) 40 MG tablet Take 1 tablet in the morning and 0.5 tablet in the evening 02/21/18   Byrum, Rose Fillers, MD  glipiZIDE (GLUCOTROL) 10 MG tablet Take 10 mg by mouth 2 (two) times daily before a meal.     [provider]  guaiFENesin (MUCINEX) 600 MG 12 hr tablet Take 1 tablet (600 mg total) by mouth 2 (two) times daily. 08/26/17   Aline August, MD  HYDROcodone-acetaminophen (NORCO)  7.5-325 MG tablet Take 1 tablet by mouth every 6 (six) hours as needed for moderate pain.    [provider]  insulin glargine (LANTUS) 100 unit/mL SOPN Inject 18 Units into the skin at bedtime.     [provider]  Linaclotide Rolan Lipa) 145 MCG CAPS capsule Take 145 mcg by mouth daily.     [provider]  loratadine (CLARITIN) 10 MG tablet Take 10 mg by mouth daily.    [provider]  metoprolol succinate (TOPROL-XL) 50 MG 24 hr tablet Take 50 mg by mouth daily. Take with or immediately following a meal.    [provider]  Naphazoline HCl (CLEAR EYES OP) Place 1 drop into both eyes daily.    [provider]  ondansetron (ZOFRAN ODT) 4 MG disintegrating tablet Take 1 tablet (4 mg total) by mouth every 8 (eight) hours as needed for nausea  or vomiting. 03/01/17   Blanchie Dessert, MD  OXYGEN Inhale 1-2 L into the lungs continuous. 1 L at night and 2 L during the day    [provider]  pantoprazole (PROTONIX) 40 MG tablet Take 1 tablet (40 mg total) by mouth daily. 08/26/17   Aline August, MD  Potassium Chloride ER 20 MEQ TBCR Take 20 mEq by mouth daily. 12/04/16   Arbutus Leas, NP  pravastatin (PRAVACHOL) 20 MG tablet Take 20 mg by mouth every evening.     [provider]  predniSONE (DELTASONE) 10 MG tablet 4 tabs for 2 days, then 3 tabs for 2 days, 2 tabs for 2 days, then 1 tab for 2 days, then stop 01/21/18   Parrett, Tammy S, NP  predniSONE (DELTASONE) 10 MG tablet Take 2 tablets (20 mg total) by mouth daily. 02/08/18   Valarie Merino, MD  predniSONE (DELTASONE) 20 MG tablet Take 2 tablets (40 mg total) by mouth daily. 12/01/17   Recardo Evangelist, PA-C  PROAIR HFA 108 [phone] Base) MCG/ACT inhaler INHALE 2 PUFFS BY MOUTH EVERY 4 HOURS AS NEEDED FOR WHEEZING 06/14/16   Collene Gobble, MD  ranitidine (ZANTAC 75) 75 MG tablet Take 1 tablet (75 mg total) 2 (two) times daily by mouth. 09/18/17   Lyda Jester M, PA-C    sitaGLIPtin (JANUVIA) 100 MG tablet Take 100 mg by mouth daily.    [provider]  spironolactone (ALDACTONE) 25 MG tablet Take 0.5 tablets (12.5 mg total) by mouth daily. 12/05/16   Arbutus Leas, NP  tiotropium (SPIRIVA) 18 MCG inhalation capsule Place 1 capsule (18 mcg total) into inhaler and inhale daily. 05/03/15   Collene Gobble, MD  zolpidem (AMBIEN) 10 MG tablet Take 10 mg by mouth at bedtime.    [provider]    Family History Family History  Problem Relation Age of Onset  . Other Father        unaware of father's medical history  . Diabetes Mother        alive @ 11  . Myasthenia gravis Mother   . Lung cancer Paternal Aunt   . Lung cancer Paternal Grandfather   . Other Unknown        multiple siblings a&w.  . Heart attack Neg Hx   . Heart failure Neg Hx     Social History Social History   Tobacco Use  . Smoking status: Former Smoker    Packs/day: 0.50    Years: 36.00    Pack years: 18.00    Types: Cigarettes    Last attempt to quit: 11/11/2015    Years since quitting: 2.3  . Smokeless tobacco: Never Used  . Tobacco comment: Approx 90 pk-yrs (up to 3ppd until ~ 2009). Smoking 3 cigs per day now.  Substance Use Topics  . Alcohol use: No    Alcohol/week: 0.0 oz  . Drug use: No     Allergies   Patient has no known allergies.   Review of Systems Review of Systems  Constitutional:       Per HPI, otherwise negative  HENT:       Per HPI, otherwise negative  Respiratory:       Per HPI, otherwise negative  Cardiovascular:       Per HPI, otherwise negative  Gastrointestinal: Negative for vomiting.  Endocrine:       Negative aside from HPI  Genitourinary:       Neg aside from HPI  Musculoskeletal:       Per HPI, otherwise negative  Skin: Negative.   Neurological: Negative for syncope.     Physical Exam Updated Vital Signs BP 134/76   Pulse 75   Temp 97.7 F (36.5 C) (Oral)   Resp (!) 33   Ht 5' (1.524 m)   Wt 100.2 kg  (221 lb)   LMP 06/04/2000 (LMP Unknown)   SpO2 95%   BMI 43.16 kg/m   Physical Exam  Constitutional: She is oriented to person, place, and time. She has a sickly appearance. No distress.  Sickly yet obese female awake and alert, nasal cannula in place, speaking clearly  HENT:  Head: Normocephalic and atraumatic.  Eyes: Conjunctivae and EOM are normal.  Cardiovascular: Normal rate and regular rhythm.  Pulmonary/Chest: No stridor. She has decreased breath sounds. She has wheezes.  Abdominal: She exhibits no distension.  Musculoskeletal: She exhibits no edema.  Neurological: She is alert and oriented to person, place, and time. No cranial nerve deficit.  Skin: Skin is warm and dry.  Psychiatric: She has a normal mood and affect.  Nursing note and vitals reviewed.    ED Treatments / Results  Labs (all labs ordered are listed, but only abnormal results are displayed) Labs Reviewed  COMPREHENSIVE METABOLIC PANEL - Abnormal; Notable for the following components:      Result Value   Glucose, Bld 124 (*)    Creatinine, Ser 1.07 (*)    Alkaline Phosphatase 37 (*)    GFR calc non Af Amer 56 (*)    All other components within normal limits  LIPASE, BLOOD  CBC  URINALYSIS, ROUTINE W REFLEX MICROSCOPIC  I-STAT TROPONIN, ED  I-STAT BETA HCG BLOOD, ED ([facility], [facility], [facility] ONLY)    EKG EKG Interpretation  Date/Time:  Friday Mar 14 2018 21:46:43 EDT Ventricular Rate:  71 PR Interval:    QRS Duration: 77 QT Interval:  423 QTC Calculation: 460 R Axis:   59 Text Interpretation:  Sinus rhythm No significant change since last tracing Confirmed by Ripley Fraise [phone]) on 03/14/2018 11:09:46 PM   Radiology Dg Chest 2 View  Result Date: 03/14/2018 CLINICAL DATA:  62 y/o F; 3 weeks of shortness of breath with exertion. EXAM: CHEST - 2 VIEW COMPARISON:  02/08/2018 chest radiograph FINDINGS: Stable cardiac silhouette within normal limits given projection and technique. Aortic atherosclerosis with  calcification. Clear lungs. No pleural effusion or pneumothorax. No acute osseous abnormality is evident. IMPRESSION: No acute pulmonary process identified. Electronically Signed   By: Kristine Garbe M.D.   On: 03/14/2018 22:42   Ct Head Wo Contrast  Result Date: 03/14/2018 CLINICAL DATA:  62 year old female with worst headache of life upon waking this morning. Recent productive cough. EXAM: CT HEAD WITHOUT CONTRAST TECHNIQUE: Contiguous axial images were obtained from the base of the skull through the vertex without intravenous contrast. COMPARISON:  Cervical spine CT 04/25/2017. FINDINGS: Brain: Cerebral volume is within normal limits for age. No midline shift, ventriculomegaly, mass effect, evidence of mass lesion, intracranial hemorrhage or evidence of cortically based acute infarction. Gray-white matter differentiation is within normal limits throughout the brain. Vascular: Mild Calcified atherosclerosis at the skull base. No suspicious intracranial vascular hyperdensity. Skull: Negative. Sinuses/Orbits: Visualized paranasal sinuses and mastoids are well pneumatized. Other: Visualized orbits and scalp soft tissues are within normal limits. IMPRESSION: Normal noncontrast Head CT. Electronically Signed   By: Genevie Ann M.D.   On: 03/14/2018 22:47    Procedures Procedures (including critical care time)  Medications Ordered in ED Medications  albuterol (PROVENTIL) (2.5 MG/3ML) 0.083% nebulizer solution 5 mg (5 mg Nebulization Given 03/14/18 2307)  methylPREDNISolone sodium succinate (SOLU-MEDROL) 125 mg/2 mL injection 125 mg (125 mg Intravenous Given 03/14/18 2307)  acetaminophen (TYLENOL) tablet 1,000 mg (1,000 mg Oral Given 03/14/18 2308)     Initial Impression / Assessment and Plan / ED Course  I have reviewed the triage vital signs and the nursing notes.  Pertinent labs & imaging results that were available during my care of the patient were reviewed by me and considered in my medical  decision making (see chart for details).     11:20 PM On repeat exam the patient is awake and alert, remains afebrile, receiving supplemental oxygen with oxygen saturation 96%, via nasal cannula, abnormal No tachycardia Reviewed initial findings including normal troponin, nonischemic EKG, low suspicion for coronary ischemia. Absent weight gain, with unremarkable x-ray, no new cardiomegaly, low suspicion for heart failure exacerbation. High suspicion for COPD exacerbation given these otherwise reassuring findings and her history. Patient has received IV steroids, breathing treatments, and with improvement here, otherwise at reassuring findings, will follow up closely with her cardiologist or pulmonologist in the coming days.   Final Clinical Impressions(s) / ED Diagnoses  COPD exacerbation   Carmin Muskrat, MD 03/14/18 2321

## 2018-03-15 MED ORDER — HYDROCODONE-ACETAMINOPHEN 5-325 MG PO TABS
1.0000 | ORAL_TABLET | Freq: Once | ORAL | Status: AC
  Administered 2018-03-15: 1 via ORAL
  Filled 2018-03-15: qty 1

## 2018-03-15 MED ORDER — PREDNISONE 20 MG PO TABS: 40.0000 mg | ORAL_TABLET | Freq: Every day | ORAL | 0 refills | Status: AC

## 2018-03-15 NOTE — ED Notes (Signed)
Pt moved out of trauma room due to a trauma coming in.  Purewick removed.  Report given to [place], Therapist, sports.  Pt to be discharged.  Awaiting orders.

## 2018-03-15 NOTE — Discharge Instructions (Signed)
As discussed, it is important that you monitor your condition, use your breathing treatments regularly, and take the steroids as prescribed. Please be sure to follow-up with your physician on Monday, or return here for concerning changes in your condition.

## 2018-03-16 ENCOUNTER — Ambulatory Visit (HOSPITAL_BASED_OUTPATIENT_CLINIC_OR_DEPARTMENT_OTHER): Payer: Medicaid Other | Attending: Emergency Medicine | Admitting: Pulmonary Disease

## 2018-03-16 VITALS — Ht 60.0 in | Wt 210.0 lb

## 2018-03-16 DIAGNOSIS — Z7951 Long term (current) use of inhaled steroids: Secondary | ICD-10-CM | POA: Diagnosis not present

## 2018-03-16 DIAGNOSIS — G4733 Obstructive sleep apnea (adult) (pediatric): Secondary | ICD-10-CM

## 2018-03-16 DIAGNOSIS — I10 Essential (primary) hypertension: Secondary | ICD-10-CM | POA: Insufficient documentation

## 2018-03-16 DIAGNOSIS — G4736 Sleep related hypoventilation in conditions classified elsewhere: Secondary | ICD-10-CM | POA: Insufficient documentation

## 2018-03-16 DIAGNOSIS — R0683 Snoring: Secondary | ICD-10-CM | POA: Diagnosis not present

## 2018-03-16 DIAGNOSIS — Z79899 Other long term (current) drug therapy: Secondary | ICD-10-CM | POA: Insufficient documentation

## 2018-03-16 DIAGNOSIS — E669 Obesity, unspecified: Secondary | ICD-10-CM | POA: Diagnosis not present

## 2018-03-16 DIAGNOSIS — Z6841 Body Mass Index (BMI) 40.0 and over, adult: Secondary | ICD-10-CM | POA: Insufficient documentation

## 2018-03-16 DIAGNOSIS — Z794 Long term (current) use of insulin: Secondary | ICD-10-CM | POA: Diagnosis not present

## 2018-03-17 ENCOUNTER — Ambulatory Visit: Payer: Medicaid Other | Admitting: Cardiology

## 2018-03-17 ENCOUNTER — Encounter: Payer: Self-pay | Admitting: Cardiology

## 2018-03-17 VITALS — BP 132/80 | HR 64 | Ht 60.0 in | Wt 218.5 lb

## 2018-03-17 DIAGNOSIS — I5032 Chronic diastolic (congestive) heart failure: Secondary | ICD-10-CM | POA: Diagnosis not present

## 2018-03-17 DIAGNOSIS — G4733 Obstructive sleep apnea (adult) (pediatric): Secondary | ICD-10-CM | POA: Diagnosis not present

## 2018-03-17 NOTE — Progress Notes (Signed)
03/17/2018 Stephanie Schultz   [birth year]  [id number]  Primary Physician [name], MD Primary Cardiologist: Dr. Radford Pax   Reason for Visit/CC: 6 month f/u for chronic diastolic HF  HPI:  Stephanie Schultz is a 62 y/o AAF, followed by Dr. Radford Pax. Her PMH is significant forDM2, HTN, hyperlipidemia, COPD with chronic respiratory failure on chronic O2, OSA on CPAP, prior tobacco abuse, chronic diastolic CHF and morbid obesity. She had a cath in 2009 showing normal coronary arteries and normal LVF. She had recurrent CP and underwent repeat LHC 12/03/16 with normal coronaries, EF 55-65%, normal LVEDP.Based on symptoms, increased O2 requirements and increased sputum production, she was felt to have an acute COPD exacerbation and was treated with improvement in symptoms.   She presents to clinic today for routine evaluation for chronic diastolic heart failure. Her last office weight when seen 09/2017 was 220 lb. Her weight is stable today at 218 lb. BP is well controlled at 132/80. HR 64 bpm w/ metoprolol. She is currently on 60 mg of Lasix daily. She notes her weights have been stable at home. No LEE. No cardiac chest pain. She remains on 2L [place] and is followed by [name]. She recently had an acute COPDE but is better. She also just has a sleep study test, ordered by [name]. Results pending.     Current Meds  Medication Sig  . albuterol (PROVENTIL) (2.5 MG/3ML) 0.083% nebulizer solution Take 3 mLs (2.5 mg total) by nebulization 4 (four) times daily as needed for wheezing or shortness of breath.  . allopurinol (ZYLOPRIM) 100 MG tablet Take 100 mg by mouth daily.  Marland Kitchen ALPRAZolam (XANAX) 1 MG tablet Take 1 mg by mouth at bedtime as needed for sleep.   Marland Kitchen aspirin EC 81 MG tablet Take 81 mg by mouth daily.  . budesonide-formoterol (SYMBICORT) 160-4.5 MCG/ACT inhaler Inhale 2 puffs into the lungs 2 (two) times daily.  . Cholecalciferol (VITAMIN D PO) Take 1 tablet by mouth daily.  . clobetasol  cream (TEMOVATE) 9.32 % Apply 1 application topically daily as needed (irritation from pull ups).   . fluconazole (DIFLUCAN) 150 MG tablet Take 150 mg by mouth daily.  . fluticasone (FLONASE) 50 MCG/ACT nasal spray Place 1 spray into both nostrils daily as needed (congestion).  . furosemide (LASIX) 40 MG tablet Take 1 tablet in the morning and 0.5 tablet in the evening (Patient taking differently: Take 1 tablet in the morning and 0.5 tablet in the evening)  . glipiZIDE (GLUCOTROL) 10 MG tablet Take 10 mg by mouth 2 (two) times daily before a meal.   . guaiFENesin (MUCINEX) 600 MG 12 hr tablet Take 1 tablet (600 mg total) by mouth 2 (two) times daily.  Marland Kitchen HYDROcodone-acetaminophen (NORCO) 7.5-325 MG tablet Take 1 tablet by mouth every 6 (six) hours as needed for moderate pain.  Marland Kitchen insulin glargine (LANTUS) 100 unit/mL SOPN Inject 18 Units into the skin at bedtime.   . Linaclotide (LINZESS) 145 MCG CAPS capsule Take 145 mcg by mouth daily.   Marland Kitchen loratadine (CLARITIN) 10 MG tablet Take 10 mg by mouth daily.  . metoprolol succinate (TOPROL-XL) 50 MG 24 hr tablet Take 50 mg by mouth daily. Take with or immediately following a meal.  . Naphazoline HCl (CLEAR EYES OP) Place 1 drop into both eyes daily.  . ondansetron (ZOFRAN ODT) 4 MG disintegrating tablet Take 1 tablet (4 mg total) by mouth every 8 (eight) hours as needed for nausea or vomiting.  Marland Kitchen  OXYGEN Inhale 1-2 L into the lungs continuous. 1 L at night and 2 L during the day  . pantoprazole (PROTONIX) 40 MG tablet Take 1 tablet (40 mg total) by mouth daily.  . Potassium Chloride ER 20 MEQ TBCR Take 20 mEq by mouth daily.  . pravastatin (PRAVACHOL) 20 MG tablet Take 20 mg by mouth every evening.   . predniSONE (DELTASONE) 20 MG tablet Take 2 tablets (40 mg total) by mouth daily for 4 days.  Marland Kitchen PROAIR HFA 108 (90 Base) MCG/ACT inhaler INHALE 2 PUFFS BY MOUTH EVERY 4 HOURS AS NEEDED FOR WHEEZING  . ranitidine (ZANTAC 75) 75 MG tablet Take 1 tablet (75 mg  total) 2 (two) times daily by mouth.  . sitaGLIPtin (JANUVIA) 100 MG tablet Take 100 mg by mouth daily.  Marland Kitchen spironolactone (ALDACTONE) 25 MG tablet Take 0.5 tablets (12.5 mg total) by mouth daily.  Marland Kitchen tiotropium (SPIRIVA) 18 MCG inhalation capsule Place 1 capsule (18 mcg total) into inhaler and inhale daily.  Marland Kitchen zolpidem (AMBIEN) 10 MG tablet Take 10 mg by mouth at bedtime.   No Known Allergies Past Medical History:  Diagnosis Date  . Anemia    chronis  . Anxiety   . Arthritis   . Cervical cancer ([place]) 1990   cervical   . Chronic diastolic CHF (congestive heart failure) ([place]) 08/10/2016  . COPD (chronic obstructive pulmonary disease) ([place])    O2 dependent. Pulmo: [name]  . Diabetes mellitus without complication ([place])    a. A1c 8.3 in 11/2015  . Emphysema   . Gallstones    s/p cholecystectomy  . GERD (gastroesophageal reflux disease)   . Gout   . History of home oxygen therapy    "2.5L; 24/7" (08/10/2016)  . Hx of cardiac catheterization    a. LHC at [facility] in [place], [place] 09/2008:  Normal coronary arteries EF 70%. b. LHC 11/2016: normal cors, normal LVEDP, EF 55-65%.  . Hyperlipidemia   . Hypertension   . Morbid obesity ([place])   . OSA on CPAP    CPAP at night   . Pneumonia   . Sickle cell trait ([place])   . Supplemental oxygen dependent    2L CONTINUOUSLEY  . Tobacco abuse    a. up to 3ppd from age 17 to 17, now 1/4 ppd (01/2013) >> Quit 10/2015   Family History  Problem Relation Age of Onset  . Other Father        unaware of father's medical history  . Diabetes Mother        alive @ 5  . Myasthenia gravis Mother   . Lung cancer Paternal Aunt   . Lung cancer Paternal Grandfather   . Other Unknown        multiple siblings a&w.  . Heart attack Neg Hx   . Heart failure Neg Hx    Past Surgical History:  Procedure Laterality Date  . BREAST BIOPSY    . CARDIAC CATHETERIZATION    . CARDIAC CATHETERIZATION N/A 12/03/2016   Procedure: Left Heart Cath and Coronary Angiography;   Surgeon: Peter M Martinique, MD;  Location: [facility];  Service: Cardiovascular;  Laterality: N/A;  . CHOLECYSTECTOMY N/A 12/13/2015   Procedure: LAPAROSCOPIC CHOLECYSTECTOMY;  Surgeon: Ralene Ok, MD;  Location: [facility];  Service: General;  Laterality: N/A;  . COLONOSCOPY  09/05/2012   Procedure: COLONOSCOPY;  Surgeon: Beryle Beams, MD;  Location: [facility];  Service: Endoscopy;  Laterality: N/A;  . COLONOSCOPY WITH PROPOFOL N/A  09/02/2015   Procedure: COLONOSCOPY WITH PROPOFOL;  Surgeon: Carol Ada, MD;  Location: [facility];  Service: Endoscopy;  Laterality: N/A;  . ESOPHAGOGASTRODUODENOSCOPY (EGD) WITH PROPOFOL N/A 03/29/2017   Procedure: ESOPHAGOGASTRODUODENOSCOPY (EGD) WITH PROPOFOL;  Surgeon: Carol Ada, MD;  Location: [facility];  Service: Endoscopy;  Laterality: N/A;  . TUBAL LIGATION     Social History   Socioeconomic History  . Marital status: Divorced    Spouse name: Not on file  . Number of children: 3  . Years of education: 11th  . Highest education level: Not on file  Occupational History  . Occupation: Disabled    Comment: Emphysema  Social Needs  . Financial resource strain: Not on file  . Food insecurity:    Worry: Not on file    Inability: Not on file  . Transportation needs:    Medical: Not on file    Non-medical: Not on file  Tobacco Use  . Smoking status: Former Smoker    Packs/day: 0.50    Years: 36.00    Pack years: 18.00    Types: Cigarettes    Last attempt to quit: 11/11/2015    Years since quitting: 2.3  . Smokeless tobacco: Never Used  . Tobacco comment: Approx 90 pk-yrs (up to 3ppd until ~ 2009). Smoking 3 cigs per day now.  Substance and Sexual Activity  . Alcohol use: No    Alcohol/week: 0.0 oz  . Drug use: No  . Sexual activity: Yes  Lifestyle  . Physical activity:    Days per week: Not on file    Minutes per session: Not on file  . Stress: Not on file  Relationships  . Social connections:    Talks on phone: Not  on file    Gets together: Not on file    Attends religious service: Not on file    Active member of club or organization: Not on file    Attends meetings of clubs or organizations: Not on file    Relationship status: Not on file  . Intimate partner violence:    Fear of current or ex partner: Not on file    Emotionally abused: Not on file    Physically abused: Not on file    Forced sexual activity: Not on file  Other Topics Concern  . Not on file  Social History Narrative   From [place], [place].  Moved to [place] about 8 years ago to be closer to her daughter. She moved back to [place] about a year and a half ago. She lives by herself. She does not routinely exercise or adhere to any particular diet.   Caffeine Use: 1-2 cups daily   Disabled; Previously in home care     Review of Systems: General: negative for chills, fever, night sweats or weight changes.  Cardiovascular: negative for chest pain, dyspnea on exertion, edema, orthopnea, palpitations, paroxysmal nocturnal dyspnea or shortness of breath Dermatological: negative for rash Respiratory: negative for cough or wheezing Urologic: negative for hematuria Abdominal: negative for nausea, vomiting, diarrhea, bright red blood per rectum, melena, or hematemesis Neurologic: negative for visual changes, syncope, or dizziness All other systems reviewed and are otherwise negative except as noted above.   Physical Exam:  Blood pressure 132/80, pulse 64, height 5' (1.524 m), weight 218 lb 8 oz (99.1 kg), last menstrual period 06/04/2000.  General appearance: alert, cooperative, no distress and morbidly obese Neck: no carotid bruit and no JVD Lungs: decreased BS bilaterally, barel chest w/c COPD Heart:  regular rate and rhythm, S1, S2 normal, no murmur, click, rub or gallop Extremities: extremities normal, atraumatic, no cyanosis or edema Pulses: 2+ and symmetric Skin: Skin color, texture, turgor normal. No rashes or  lesions Neurologic: Grossly normal  EKG not performed -- personally reviewed   ASSESSMENT AND PLAN:   1. Chronic Diastolic HF: Volume stable. Weight stable. She is on chronic home O2 but no increased O2 requirements. Respiratory status stable. Her PCP checked BMP last month, showing stable SCr at 1.1 and normal K. Continue Lasix, 60 mg daily + low sodium diet and daily weights. She is on BB therapy w/ metoprolol w/ HR in the 60s. BP is well controlled at 130/80. Continue current regimen. No changes. F/u in 6 months.   2. Normal Cardiac Cath: 11/2016. Normal coronaries.   3. HTN: controlled on current regimen.  4. COPD: on chronic O2. Followed by pulmonology.  5. Obesity: pt acknowledges, that she needs to loose weight. We discussed diet and exercise.   6. DM: followed by PCP.    Follow-Up: 6 months w/ Dr. Karie Kirks, M[facility] 03/17/2018 9:09 AM

## 2018-03-17 NOTE — Patient Instructions (Signed)
Medication Instructions:   Your physician recommends that you continue on your current medications as directed. Please refer to the Current Medication list given to you today.   If you need a refill on your cardiac medications before your next appointment, please call your pharmacy.  Labwork: NONE ORDERED  TODAY    Testing/Procedures: NONE ORDERED  TODAY    Follow-Up:  Your physician wants you to follow-up in:  IN 6  MONTHS WITH DR TURNER   You will receive a reminder letter in the mail two months in advance. If you don't receive a letter, please call our office to schedule the follow-up appointment.      Any Other Special Instructions Will Be Listed Below (If Applicable).                                                                                                                                                   

## 2018-03-17 NOTE — Procedures (Signed)
    Patient Name: Stephanie Schultz, Stephanie Schultz Date: 03/16/2018   Gender: Female  D.O.B: 01/01/1956  Age (years): 8  Referring Provider: [name]  Height (inches): 84  Interpreting Physician: Chesley Mires MD, ABSM  Weight (lbs): 210  RPSGT: Baxter Flattery  BMI: 41  MRN: [id number]  Neck Size: 18.50   CLINICAL INFORMATION  Sleep Study Type: NPSG Indication for sleep study: Hypertension, Obesity, Snoring Epworth Sleepiness Score: 0 SLEEP STUDY TECHNIQUE  As per the AASM Manual for the Scoring of Sleep and Associated Events v2.3 (April 2016) with a hypopnea requiring 4% desaturations. The channels recorded and monitored were frontal, central and occipital EEG, electrooculogram (EOG), submentalis EMG (chin), nasal and oral airflow, thoracic and abdominal wall motion, anterior tibialis EMG, snore microphone, electrocardiogram, and pulse oximetry. MEDICATIONS  Medications self-administered by patient taken the night of the study : ZOLPIDEM TARTRATE, SYMBICORT, LANTUS, PRAVASTATIN, PREDNISONE SLEEP ARCHITECTURE  The study was initiated at 10:30:38 PM and ended at 5:16:19 AM. Sleep onset time was 162.2 minutes and the sleep efficiency was 11.8%%. The total sleep time was 48 minutes. Stage REM latency was N/A minutes. The patient spent 3.1%% of the night in stage N1 sleep, 96.9%% in stage N2 sleep, 0.0%% in stage N3 and 0.00% in REM. Alpha intrusion was absent. Supine sleep was 0.00%. RESPIRATORY PARAMETERS  The overall apnea/hypopnea index (AHI) was 0.0 per hour. There were 0 total apneas, including 0 obstructive, 0 central and 0 mixed apneas. There were 0 hypopneas and 0 RERAs. The AHI during Stage REM sleep was N/A per hour. AHI while supine was N/A per hour. The mean oxygen saturation was 97.7%. The minimum SpO2 during sleep was 96.0%. snoring was noted during this study. CARDIAC DATA  The 2 lead EKG demonstrated sinus rhythm. The mean heart rate was 65.0 beats per minute. Other EKG  findings include: None.  LEG MOVEMENT DATA  The total PLMS were 0 with a resulting PLMS index of 0.0. Associated arousal with leg movement index was 0.0 . IMPRESSIONS  - She had difficulty with sleep initiation and sleep maintenance. She had only 48 minutes of total sleep time. Therefore, it is difficult to determine whether she has significant sleep disordered breathing. - Her AHI was 0 for the time period she was asleep. - Her SpO2 low as 96%. The study was conducted with her using her baseline 2 liters oxygen. DIAGNOSIS  - Nocturnal Hypoxemia (327.26 [G47.36 ICD-10]) RECOMMENDATIONS  - If there is still concern for significant sleep disordered breathing, then options would be to arrange for repeat in lab sleep study with additional sleep aide medication or arrange for home sleep study. [Electronically signed] 03/17/2018 05:07 PM Chesley Mires MD, [place], American Board of Sleep Medicine  NPI: [id number]

## 2018-03-25 ENCOUNTER — Ambulatory Visit: Payer: Medicaid Other | Admitting: Emergency Medicine

## 2018-04-10 ENCOUNTER — Ambulatory Visit: Payer: Medicaid Other | Admitting: Emergency Medicine

## 2018-04-10 ENCOUNTER — Encounter: Payer: Self-pay | Admitting: Emergency Medicine

## 2018-04-10 DIAGNOSIS — G4733 Obstructive sleep apnea (adult) (pediatric): Secondary | ICD-10-CM | POA: Diagnosis not present

## 2018-04-10 DIAGNOSIS — I5032 Chronic diastolic (congestive) heart failure: Secondary | ICD-10-CM

## 2018-04-10 DIAGNOSIS — J9611 Chronic respiratory failure with hypoxia: Secondary | ICD-10-CM

## 2018-04-10 DIAGNOSIS — J449 Chronic obstructive pulmonary disease, unspecified: Secondary | ICD-10-CM | POA: Diagnosis not present

## 2018-04-10 MED ORDER — FUROSEMIDE 40 MG PO TABS: ORAL_TABLET | ORAL | 2 refills | Status: DC

## 2018-04-10 NOTE — Progress Notes (Signed)
Subjective:    Patient ID: Stephanie Schultz, female    DOB: 08/16/1956, 62 y.o.   MRN: [id number]  ROV 09/24/17 --this is a follow-up visit for patient with a history of severe COPD with a positive bronchodilator response, chronic cough due to this and upper airway irritation syndrome.  She also has obstructive sleep apnea, hypertension with diastolic dysfunction.  I have not seen her for almost 2 years - she has followed with Dr Camillo Flaming at [facility].  She was hospitalized mid October with possible pneumonia versus cardiogenic pulmonary edema. She has improved since the hospitalization, completed a pred taper. Her dyspnea seems to correlate with her wt and fluid status. She ate Lays potato chips yesterday and has gained 3 lbs, feels more SOB. She is on 2.5L/min at rest, up to 3l/min w walking. She is using Symbicort and Spiriva. She uses albuterol more than 4 x a day on many days. She is on loratadine, flonase only prn. She had a PSG in April 2018 that showed she could come off CPAP - no longer using.   Acute OV 02/12/18 --patient has a history of obesity, severe COPD with a positive bronchodilator response, chronic cough and upper airway irritation syndrome.  She has obstructive sleep apnea, hypertension with diastolic dysfunction and history of diastolic CHF.  She is no longer on CPAP based on a reassuring sleep study from April 2018.  She was seen 1 month ago here with some increased dyspnea and was treated for an acute exacerbation with antibiotics and prednisone.  She reports that she benefited from this, was doing well until 12 days ago when she had more exertional SOB some rib and flank pain, R subscapular pain. Also some wheeze.  She was seen in the emergency department 3/30 for dyspnea and exertional hypoxemia. A chest x-ray done 02/08/18 showed some evidence for possible mild pulmonary vascular congestion. She was given pred for 5 days with some improvement but not back to baseline. BP has been well  controlled. She may have some increased LE edema, has gained 3 lbs. She is using o2 at 2.5-3 L/min. Takes loratadine, has nasal congestion. Difficulty taking flonase due to nasal bleeding.   ROV 02/21/18 --follow-up visit for obese woman with a history of COPD and a positive bronchodilator response, upper airway irritation syndrome with chronic cough hypertension with diastolic dysfunction,.  She had obstructive sleep apnea and was on CPAP but this was discontinued after a reassuring sleep study a year ago (elsewhere).  She has been having difficulty for the last 1-2 months with increased cough, wheeze.  She has been treated with prednisone twice over that time.  I saw her 9 days ago and increased her Lasix for 3 days.  She believes that the lasix helped her breathing. She is managed with Spiriva, Symbicort, loratadine, nasal saline spray.  She has not tolerated Flonase due to nose bleeding. She returns today reporting that she has improved some. She is experiencing chronic hoarseness. She experienced an acute episode dyspnea 4/11, took xanax and felt that it helped her. She is on omeprazole (med list says protonix) has breakthrough GERD sx. She is having frequent diarrhea.  She still describes sleep disturbance, snoring.    ROV 04/10/18 -- 62 year old woman with obesity, COPD with an asthmatic component.  Obstructive sleep apnea.  We will arrange for a split-night sleep study which was done on 03/16/2018.  This shows significant difficulty with sleep maintenance with only 48 minutes of total sleep time.  There were  no apneas captured.  No significant limb movements. She says that she had trouble tolerating the wires, etc. She did take her Lorrin Mais before the study but didn't work. She has seen cardiology and they like the lasix dose of 60mg  total daily. She is on Spiriva and Symbicort.   ROS  See history of present illness   Objective:   Physical Exam Vitals:   04/10/18 1509  BP: 126/78  Pulse: 86  SpO2:  90%  Weight: 222 lb (100.7 kg)  Height: 5' (1.524 m)    Gen: Obese, in no distress,  normal affect  ENT: No lesions,  mouth clear,  oropharynx clear, no postnasal drip, strong voice today  Neck: No JVD, no stridor  Lungs: No use of accessory muscles, distant, no wheezes   Cardiovascular: RRR, heart sounds normal, no murmur or gallops, no peripheral edema  Musculoskeletal: No deformities, no cyanosis or clubbing  Neuro: alert, non focal  Skin: Warm, no lesions or rashes, old scars on B UE's   Assessment & Plan:   COPD (chronic obstructive pulmonary disease) (HCC) Please continue Spiriva and Symbicort as you have been taking them.  Chronic diastolic CHF (congestive heart failure) (HCC) Agree with continuing your current Lasix dosing, 60 mg total every day.  Continue to follow with cardiology  Obstructive sleep apnea We will arrange for a home sleep study.  Chronic respiratory failure ([place]) Continue your oxygen as you have been wearing it.  [name], MD, PhD 04/10/2018, 3:21 PM [facility] [phone] or if no answer [phone]

## 2018-04-10 NOTE — Assessment & Plan Note (Signed)
We will arrange for a home sleep study.

## 2018-04-10 NOTE — Assessment & Plan Note (Signed)
Continue your oxygen as you have been wearing it.

## 2018-04-10 NOTE — Assessment & Plan Note (Signed)
Agree with continuing your current Lasix dosing, 60 mg total every day.  Continue to follow with cardiology

## 2018-04-10 NOTE — Assessment & Plan Note (Signed)
Please continue Spiriva and Symbicort as you have been taking them.

## 2018-04-10 NOTE — Patient Instructions (Signed)
Please continue Spiriva and Symbicort as you have been taking them. Agree with continuing your current Lasix dosing, 60 mg total every day.  Continue to follow with cardiology Continue your oxygen as you have been wearing it. We will arrange for a home sleep study. Follow with Dr Lamonte Sakai in 3 months or sooner if you have any problems.

## 2018-04-21 ENCOUNTER — Telehealth: Payer: Self-pay | Admitting: Emergency Medicine

## 2018-04-21 ENCOUNTER — Telehealth: Payer: Self-pay | Admitting: Internal Medicine

## 2018-04-21 ENCOUNTER — Telehealth: Payer: Self-pay | Admitting: Cardiology

## 2018-04-21 NOTE — Telephone Encounter (Signed)
New message    Pt c/o medication issue:  1. Name of Medication: furosemide (LASIX) 40 MG tablet  2. How are you currently taking this medication (dosage and times per day)?Take 1 tablet in the morning and 0.5 tablet in the evening  3. Are you having a reaction (difficulty breathing--STAT)? no  4. What is your medication issue? Patient calling to confirm the dosage on medication. Patient her pill pack had 80mg  of fluid pill

## 2018-04-21 NOTE — Telephone Encounter (Signed)
Pt stated she was was confused about much lasix she should be taking. Per chart pt should be taking 40 mg in the morning and 20 mg (1/2 tablet) in the evening. She states [name] advised her to take 40 mg in the morning and 20 in the evening. I explained to pt per note he was in agreement with the dosage that was already prescribed by her cardiologist and that she should not be taking any additional doses of lasix.   She also stated that she has not been urinating as much. She c/o of itching and a unusual sensation after she stops urinating. I informed her to follow up with her primary MD. I also informed her to monitor weight daily and monitor sodium intake.  She stated understanding and was thankful for the call

## 2018-04-21 NOTE — Telephone Encounter (Signed)
Called and left message for Patient to call back. 

## 2018-04-21 NOTE — Telephone Encounter (Signed)
Error-no note

## 2018-04-22 NOTE — Telephone Encounter (Signed)
Spoke with pt   Patient Instructions by Collene Gobble, MD at 04/10/2018 2:45 PM  Author: Collene Gobble, MD Author Type: Physician Filed: 04/10/2018 3:20 PM  Note Status: Signed Cosign: Cosign Not Required Encounter Date: 04/10/2018  Editor: Collene Gobble, MD (Physician)    Please continue Spiriva and Symbicort as you have been taking them. Agree with continuing your current Lasix dosing, 60 mg total every day.  Continue to follow with cardiology Continue your oxygen as you have been wearing it. We will arrange for a home sleep study. Follow with Dr Lamonte Sakai in 3 months or sooner if you have any problems.

## 2018-04-22 NOTE — Telephone Encounter (Signed)
Spoke with pt, she is wanting to clarify lasix dose and was confused about how much mg daily she should take. According to the notes [name] instructed pt to take 60 mg daily. She mentioned the cardiologist wanted her to take lasix as well but I explained to her that the cardiologist was agreeing for her to stay on the 60mg  regimen RB recommended. I re-assured her that she needed to take the lasix everyday totaling 60 mg. Pt verbalized understanding and nothing further is needed.   Patient Instructions by Collene Gobble, MD at 04/10/2018 2:45 PM  Author: Collene Gobble, MD Author Type: Physician Filed: 04/10/2018 3:20 PM  Note Status: Signed Cosign: Cosign Not Required Encounter Date: 04/10/2018  Editor: Collene Gobble, MD (Physician)    Please continue Spiriva and Symbicort as you have been taking them. Agree with continuing your current Lasix dosing, 60 mg total every day.  Continue to follow with cardiology Continue your oxygen as you have been wearing it. We will arrange for a home sleep study. Follow with Dr Lamonte Sakai in 3 months or sooner if you have any problems.      Cardiology note.   ASSESSMENT AND PLAN:   1. Chronic Diastolic HF: Volume stable. Weight stable. She is on chronic home O2 but no increased O2 requirements. Respiratory status stable. Her PCP checked BMP last month, showing stable SCr at 1.1 and normal K. Continue Lasix, 60 mg daily + low sodium diet and daily weights. She is on BB therapy w/ metoprolol w/ HR in the 60s. BP is well controlled at 130/80. Continue current regimen. No changes. F/u in 6 months.   2. Normal Cardiac Cath: 11/2016. Normal coronaries.

## 2018-04-23 ENCOUNTER — Other Ambulatory Visit: Payer: Self-pay | Admitting: Cardiology

## 2018-04-23 NOTE — Telephone Encounter (Signed)
Pt is requesting refills on these medications. Would Dr. Radford Pax like to refill these medications? Please address

## 2018-05-05 ENCOUNTER — Telehealth: Payer: Self-pay | Admitting: Emergency Medicine

## 2018-05-05 DIAGNOSIS — G4733 Obstructive sleep apnea (adult) (pediatric): Secondary | ICD-10-CM

## 2018-05-05 NOTE — Telephone Encounter (Signed)
Pt had a inlab study on 03/16/18 not sure why she would need a HST looks like RB ordered at 02/21/18 visit

## 2018-05-05 NOTE — Telephone Encounter (Signed)
Closed in error- hold TCB

## 2018-05-05 NOTE — Telephone Encounter (Signed)
Per last OV note, pt only slept for 49 minutes of split night study, which was why a home sleep test was ordered. PCCs please advise when HST is ordered.  Thanks!

## 2018-05-05 NOTE — Telephone Encounter (Signed)
I have ordered to [place]

## 2018-05-05 NOTE — Telephone Encounter (Signed)
There is no order on this patient for a hst

## 2018-05-05 NOTE — Telephone Encounter (Signed)
[facility] - can you guys help with this?

## 2018-05-06 ENCOUNTER — Other Ambulatory Visit: Payer: Self-pay | Admitting: Emergency Medicine

## 2018-05-06 DIAGNOSIS — G4733 Obstructive sleep apnea (adult) (pediatric): Secondary | ICD-10-CM

## 2018-05-09 ENCOUNTER — Ambulatory Visit: Payer: Medicaid Other | Admitting: Podiatry

## 2018-05-09 ENCOUNTER — Encounter: Payer: Self-pay | Admitting: Podiatry

## 2018-05-09 DIAGNOSIS — E119 Type 2 diabetes mellitus without complications: Secondary | ICD-10-CM

## 2018-05-09 DIAGNOSIS — B351 Tinea unguium: Secondary | ICD-10-CM

## 2018-05-09 DIAGNOSIS — M79609 Pain in unspecified limb: Principal | ICD-10-CM

## 2018-05-09 DIAGNOSIS — M79676 Pain in unspecified toe(s): Secondary | ICD-10-CM | POA: Diagnosis not present

## 2018-05-09 NOTE — Progress Notes (Signed)
Complaint:  Visit Type: Patient returns to my office for continued preventative foot care services. Complaint: Patient states" my nails have grown long and thick and become painful to walk and wear shoes" Patient has been diagnosed with DM with no foot complications. The patient presents for preventative foot care services. No changes to ROS.  Patient was dispensed a plantar fascia brace which has help relieve pain in her left foot.  Podiatric Exam: Vascular: dorsalis pedis and posterior tibial pulses are palpable bilateral. Capillary return is immediate. Temperature gradient is WNL. Skin turgor WNL  Sensorium: Normal Semmes Weinstein monofilament test. Normal tactile sensation bilaterally. Nail Exam: Pt has thick disfigured discolored nails with subungual debris noted bilateral entire nail hallux through fifth toenails Ulcer Exam: There is no evidence of ulcer or pre-ulcerative changes or infection. Orthopedic Exam: Muscle tone and strength are WNL. No limitations in general ROM. No crepitus or effusions noted. Foot type and digits show no abnormalities. Bony prominences are unremarkable. Skin: No Porokeratosis. No infection or ulcers  Diagnosis:  Onychomycosis, , Pain in right toe, pain in left toes  Treatment & Plan Procedures and Treatment: Consent by patient was obtained for treatment procedures.   Debridement of mycotic and hypertrophic toenails, 1 through 5 bilateral and clearing of subungual debris. No ulceration, no infection noted. Told to continue wearing her brace.   Return Visit-Office Procedure: Patient instructed to return to the office for a follow up visit 3 months for continued evaluation and treatment.    Gardiner Barefoot DPM

## 2018-05-22 ENCOUNTER — Other Ambulatory Visit: Payer: Self-pay

## 2018-05-22 ENCOUNTER — Other Ambulatory Visit: Payer: Self-pay | Admitting: Cardiology

## 2018-05-22 MED ORDER — METOPROLOL TARTRATE 50 MG PO TABS: 50.0000 mg | ORAL_TABLET | Freq: Every day | ORAL | 3 refills | Status: DC

## 2018-05-22 NOTE — Telephone Encounter (Signed)
Okay to refill under Dr Radford Pax? Please advise. Thanks, MI

## 2018-05-23 ENCOUNTER — Other Ambulatory Visit: Payer: Self-pay

## 2018-05-30 ENCOUNTER — Telehealth: Payer: Self-pay | Admitting: Emergency Medicine

## 2018-05-30 MED ORDER — PREDNISONE 10 MG PO TABS: ORAL_TABLET | ORAL | 0 refills | Status: DC

## 2018-05-30 NOTE — Telephone Encounter (Signed)
Medication has been sent in. Patient is aware. Nothing further needed.

## 2018-05-30 NOTE — Telephone Encounter (Signed)
Prednisone 10 mg take  4 each am x 2 days,   2 each am x 2 days,  1 each am x 2 days and stop  

## 2018-05-30 NOTE — Telephone Encounter (Signed)
Called and spoke with Patient.  She states that the heat has been hard on her this week and she has been out in it.  She is complaining of chest tightness at times, with [facility].  She has been using her nebs every 4 hours and her emergency inhaler. She is requesting Prednisone, if possible.  She stated that she has limited transportation and is unable to come in for OV with NP's today. Patient sees RB.  Will route to Dr. Melvyn Novas  LOV 05/06/18  No Known Allergies   Current Outpatient Medications on File Prior to Visit  Medication Sig Dispense Refill  . albuterol (PROVENTIL) (2.5 MG/3ML) 0.083% nebulizer solution Take 3 mLs (2.5 mg total) by nebulization 4 (four) times daily as needed for wheezing or shortness of breath. 540 mL 6  . allopurinol (ZYLOPRIM) 100 MG tablet Take 100 mg by mouth daily.    Marland Kitchen ALPRAZolam (XANAX) 1 MG tablet Take 1 mg by mouth at bedtime as needed for sleep.   0  . aspirin EC 81 MG tablet Take 81 mg by mouth daily.    . budesonide-formoterol (SYMBICORT) 160-4.5 MCG/ACT inhaler Inhale 2 puffs into the lungs 2 (two) times daily. 1 Inhaler 6  . Cholecalciferol (VITAMIN D PO) Take 1 tablet by mouth daily.    . clobetasol cream (TEMOVATE) 3.76 % Apply 1 application topically daily as needed (irritation from pull ups).   0  . fluconazole (DIFLUCAN) 150 MG tablet Take 150 mg by mouth daily.  0  . fluticasone (FLONASE) 50 MCG/ACT nasal spray Place 1 spray into both nostrils daily as needed (congestion).    . furosemide (LASIX) 40 MG tablet Take 1 tablet in the morning and 0.5 tablet in the evening 135 tablet 3  . glipiZIDE (GLUCOTROL) 10 MG tablet Take 10 mg by mouth 2 (two) times daily before a meal.     . guaiFENesin (MUCINEX) 600 MG 12 hr tablet Take 1 tablet (600 mg total) by mouth 2 (two) times daily. 14 tablet 0  . HYDROcodone-acetaminophen (NORCO) 7.5-325 MG tablet Take 1 tablet by mouth every 6 (six) hours as needed for moderate pain.    Marland Kitchen insulin glargine (LANTUS) 100 unit/mL  SOPN Inject 18 Units into the skin at bedtime.     . Linaclotide (LINZESS) 145 MCG CAPS capsule Take 145 mcg by mouth daily.     Marland Kitchen loratadine (CLARITIN) 10 MG tablet Take 10 mg by mouth daily.    . metoprolol succinate (TOPROL-XL) 50 MG 24 hr tablet Take 50 mg by mouth daily. Take with or immediately following a meal.    . metoprolol tartrate (LOPRESSOR) 50 MG tablet Take 1 tablet (50 mg total) by mouth daily. 90 tablet 3  . Naphazoline HCl (CLEAR EYES OP) Place 1 drop into both eyes daily.    . ondansetron (ZOFRAN ODT) 4 MG disintegrating tablet Take 1 tablet (4 mg total) by mouth every 8 (eight) hours as needed for nausea or vomiting. 20 tablet 0  . OXYGEN Inhale 1-2 L into the lungs continuous. 1 L at night and 2 L during the day    . pantoprazole (PROTONIX) 40 MG tablet Take 1 tablet (40 mg total) by mouth daily.    . Potassium Chloride ER 20 MEQ TBCR Take 20 mEq by mouth daily. 30 tablet 12  . potassium chloride SA (K-DUR,KLOR-CON) 20 MEQ tablet TAKE 1 TABLET IN THE MORNING. 90 tablet 3  . pravastatin (PRAVACHOL) 20 MG tablet Take 20 mg by mouth  every evening.     Marland Kitchen PROAIR HFA 108 (90 Base) MCG/ACT inhaler INHALE 2 PUFFS BY MOUTH EVERY 4 HOURS AS NEEDED FOR WHEEZING 8.5 g 2  . ranitidine (ZANTAC 75) 75 MG tablet Take 1 tablet (75 mg total) 2 (two) times daily by mouth. 60 tablet 5  . sitaGLIPtin (JANUVIA) 100 MG tablet Take 100 mg by mouth daily.    Marland Kitchen spironolactone (ALDACTONE) 25 MG tablet Take 0.5 tablets (12.5 mg total) by mouth daily. 15 tablet 12  . tiotropium (SPIRIVA) 18 MCG inhalation capsule Place 1 capsule (18 mcg total) into inhaler and inhale daily. 30 capsule 6  . zolpidem (AMBIEN) 10 MG tablet Take 10 mg by mouth at bedtime.     No current facility-administered medications on file prior to visit.

## 2018-06-01 ENCOUNTER — Ambulatory Visit (HOSPITAL_BASED_OUTPATIENT_CLINIC_OR_DEPARTMENT_OTHER): Payer: Medicaid Other | Attending: Emergency Medicine | Admitting: Pulmonary Disease

## 2018-06-01 VITALS — Ht 60.0 in | Wt 213.0 lb

## 2018-06-01 DIAGNOSIS — R0902 Hypoxemia: Secondary | ICD-10-CM | POA: Insufficient documentation

## 2018-06-01 DIAGNOSIS — I509 Heart failure, unspecified: Secondary | ICD-10-CM | POA: Insufficient documentation

## 2018-06-01 DIAGNOSIS — G4734 Idiopathic sleep related nonobstructive alveolar hypoventilation: Secondary | ICD-10-CM

## 2018-06-01 DIAGNOSIS — Z794 Long term (current) use of insulin: Secondary | ICD-10-CM | POA: Diagnosis not present

## 2018-06-01 DIAGNOSIS — G4733 Obstructive sleep apnea (adult) (pediatric): Secondary | ICD-10-CM | POA: Diagnosis present

## 2018-06-01 DIAGNOSIS — Z79899 Other long term (current) drug therapy: Secondary | ICD-10-CM | POA: Diagnosis not present

## 2018-06-01 DIAGNOSIS — E119 Type 2 diabetes mellitus without complications: Secondary | ICD-10-CM | POA: Insufficient documentation

## 2018-06-01 DIAGNOSIS — E669 Obesity, unspecified: Secondary | ICD-10-CM | POA: Diagnosis not present

## 2018-06-01 DIAGNOSIS — I110 Hypertensive heart disease with heart failure: Secondary | ICD-10-CM | POA: Insufficient documentation

## 2018-06-03 DIAGNOSIS — G4733 Obstructive sleep apnea (adult) (pediatric): Secondary | ICD-10-CM | POA: Diagnosis not present

## 2018-06-03 NOTE — Procedures (Signed)
    Patient Name: Stephanie Schultz, [place] Date: 06/01/2018 Gender: Female D.O.B: Dec 01, 1955 Age (years): 61 Referring Provider: [name] Height (inches): 60 Interpreting Physician: Chesley Mires MD, ABSM Weight (lbs): 210 RPSGT: Jorge Ny BMI: 41 MRN: [id number] Neck Size: 18.50  CLINICAL INFORMATION  Sleep Study Type: NPSG  Indication for sleep study: Congestive Heart Failure, COPD, Diabetes, Hypertension, Morning Headaches, Obesity, OSA  Epworth Sleepiness Score: 3  Most recent polysomnogram dated 03/16/2018 revealed an AHI of 0.0/h and RDI of 0.0/h.  She had insufficient sleep time during this test, and could not be determined if she had sleep apnea still.  SLEEP STUDY TECHNIQUE  As per the AASM Manual for the Scoring of Sleep and Associated Events v2.3 (April 2016) with a hypopnea requiring 4% desaturations. The channels recorded and monitored were frontal, central and occipital EEG, electrooculogram (EOG), submentalis EMG (chin), nasal and oral airflow, thoracic and abdominal wall motion, anterior tibialis EMG, snore microphone, electrocardiogram, and pulse oximetry.  MEDICATIONS  Medications self-administered by patient taken the night of the study : ZOLPIDEM TARTRATE, SYMBICORT, LANTUS, PRAVASTATIN, PREDNISONE, AMBIEN, GLIPIZIDE, ALBUTEROL  SLEEP ARCHITECTURE  The study was initiated at 10:07:51 PM and ended at 4:40:08 AM. Sleep onset time was 40.4 minutes and the sleep efficiency was 83.6%%. The total sleep time was 327.9 minutes. Stage REM latency was 36.0 minutes. The patient spent 4.4%% of the night in stage N1 sleep, 82.2%% in stage N2 sleep, 0.0%% in stage N3 and 13.42% in REM. Alpha intrusion was absent. Supine sleep was 19.37%.  RESPIRATORY PARAMETERS  The overall apnea/hypopnea index (AHI) was 0.0 per hour. There were 0 total apneas, including 0 obstructive, 0 central and 0 mixed apneas. There were 0 hypopneas and 9 RERAs. The AHI during Stage REM sleep  was 0.0 per hour. AHI while supine was 0.0 per hour. The mean oxygen saturation was 97.5%. The minimum SpO2 during sleep was 95.0%. soft snoring was noted during this study.  CARDIAC DATA  The 2 lead EKG demonstrated sinus rhythm. The mean heart rate was 69.0 beats per minute. Other EKG findings include: None.  LEG MOVEMENT DATA  The total PLMS were 0 with a resulting PLMS index of 0.0. Associated arousal with leg movement index was 10.6 .  IMPRESSIONS  - She had a few respiratory events, but not frequent enough to qualify for diagnosis of obstructive sleep apnea. - She used 2.5 liters supplemental oxygen during the study.  SpO2 low was 95%. - Mild snoring noted.  DIAGNOSIS  - Nocturnal Hypoxemia (327.26 [G47.36 ICD-10])  RECOMMENDATIONS  - Continue 2.5 liters oxygen at night.  [Electronically signed] 06/03/2018 12:01 PM  Chesley Mires MD, [place], American Board of Sleep Medicine  NPI: [id number]

## 2018-06-26 ENCOUNTER — Ambulatory Visit: Payer: Medicaid Other | Admitting: Emergency Medicine

## 2018-06-26 ENCOUNTER — Encounter: Payer: Self-pay | Admitting: Emergency Medicine

## 2018-06-26 ENCOUNTER — Ambulatory Visit (INDEPENDENT_AMBULATORY_CARE_PROVIDER_SITE_OTHER)
Admission: RE | Admit: 2018-06-26 | Discharge: 2018-06-26 | Disposition: A | Discharge status: Home / Self Care | Payer: Medicaid Other | Source: Ambulatory Visit | Attending: Emergency Medicine | Admitting: Emergency Medicine

## 2018-06-26 VITALS — BP 110/72 | HR 76 | Ht 60.0 in | Wt 222.8 lb

## 2018-06-26 DIAGNOSIS — J309 Allergic rhinitis, unspecified: Secondary | ICD-10-CM | POA: Diagnosis not present

## 2018-06-26 DIAGNOSIS — J449 Chronic obstructive pulmonary disease, unspecified: Secondary | ICD-10-CM

## 2018-06-26 DIAGNOSIS — J9611 Chronic respiratory failure with hypoxia: Secondary | ICD-10-CM

## 2018-06-26 MED ORDER — FLUTICASONE-UMECLIDIN-VILANT 100-62.5-25 MCG/INH IN AEPB: 1.0000 | INHALATION_SPRAY | Freq: Every day | RESPIRATORY_TRACT | 0 refills | Status: AC

## 2018-06-26 NOTE — Progress Notes (Signed)
Stephanie Schultz    [id number]    [birth year]  Primary Care Physician:Garba, Henderson Newcomer, MD  Referring Physician: [name], MD [address] [place], [place] [address]  Chief complaint:  COPD, OSA, Respiratory failure  HPI: Stephanie Schultz is a 62 yo F w/ PMH of Diastolic CHF, HTN, HLD, GERD, Allergic rhinitis presenting to the clinic for continuing management of her COPD and respiratory failure. She was seen last time 3 months ago and was ordered for sleep study to r/o osa. Her sleep study results were negative. She is continuing to endorse dyspnea on exertion currently on symbicort. Unable to tolerate walking a room's length. Alleviated by rescue inhaler and increasing her oxygen to 3L from 2.5L. She is using her rescue inhaler 4-5 times a day and states difficulty with feeling rested after sleep.  She is also using her Flonase and Claritin for her allergic sinusitis but she wants to stop the Claritin because she is not feeling any benefits. Continuing to endorse significant sinus congestion but no rhinitis or productive cough.  She is also continuing to take her pantoprazole 40mg  daily. Denies any significant heartburn.  Allergies as of 06/26/2018  . (No Known Allergies)    Past Medical History:  Diagnosis Date  . Anemia    chronis  . Anxiety   . Arthritis   . Cervical cancer ([place]) 1990   cervical   . Chronic diastolic CHF (congestive heart failure) ([place]) 08/10/2016  . COPD (chronic obstructive pulmonary disease) ([place])    O2 dependent. Pulmo: [name]  . Diabetes mellitus without complication ([place])    a. A1c 8.3 in 11/2015  . Emphysema   . Gallstones    s/p cholecystectomy  . GERD (gastroesophageal reflux disease)   . Gout   . History of home oxygen therapy    "2.5L; 24/7" (08/10/2016)  . Hx of cardiac catheterization    a. LHC at [facility] in [place], [place] 09/2008:  Normal coronary arteries EF 70%. b. LHC 11/2016: normal cors, normal LVEDP, EF 55-65%.    . Hyperlipidemia   . Hypertension   . Morbid obesity ([place])   . OSA on CPAP    CPAP at night   . Pneumonia   . Sickle cell trait ([place])   . Supplemental oxygen dependent    2L CONTINUOUSLEY  . Tobacco abuse    a. up to 3ppd from age 11 to 25, now 1/4 ppd (01/2013) >> Quit 10/2015    Past Surgical History:  Procedure Laterality Date  . BREAST BIOPSY    . CARDIAC CATHETERIZATION    . CARDIAC CATHETERIZATION N/A 12/03/2016   Procedure: Left Heart Cath and Coronary Angiography;  Surgeon: Peter M Martinique, MD;  Location: [facility];  Service: Cardiovascular;  Laterality: N/A;  . CHOLECYSTECTOMY N/A 12/13/2015   Procedure: LAPAROSCOPIC CHOLECYSTECTOMY;  Surgeon: Ralene Ok, MD;  Location: [facility];  Service: General;  Laterality: N/A;  . COLONOSCOPY  09/05/2012   Procedure: COLONOSCOPY;  Surgeon: Beryle Beams, MD;  Location: [facility];  Service: Endoscopy;  Laterality: N/A;  . COLONOSCOPY WITH PROPOFOL N/A 09/02/2015   Procedure: COLONOSCOPY WITH PROPOFOL;  Surgeon: Carol Ada, MD;  Location: [facility];  Service: Endoscopy;  Laterality: N/A;  . ESOPHAGOGASTRODUODENOSCOPY (EGD) WITH PROPOFOL N/A 03/29/2017   Procedure: ESOPHAGOGASTRODUODENOSCOPY (EGD) WITH PROPOFOL;  Surgeon: Carol Ada, MD;  Location: [facility];  Service: Endoscopy;  Laterality: N/A;  . TUBAL LIGATION  Review of systems: Constitutional: Negative for fever and chills.  HENT: Negative.   Eyes: Negative for blurred vision.  Respiratory: as per HPI  Cardiovascular: Negative for chest pain and palpitations.  Gastrointestinal: Negative for vomiting, diarrhea, blood per rectum. Genitourinary: Negative for dysuria, urgency, frequency and hematuria. All other systems reviewed and are negative.  Physical Exam: Blood pressure 110/72, pulse 76, height 5' (1.524 m), weight 222 lb 12.8 oz (101.1 kg), last menstrual period 06/04/2000, SpO2 90 %. Gen:     Obese female in no acute distress HEENT:  EOMI,  sclera anicteric Neck:     No masses; no thyromegaly Lungs:    Distant breath sounds; No wheeze; no rales CV:         Regular rate and rhythm; no murmurs Abd:      + bowel sounds; soft, non-tender; no palpable masses, no distension Ext:    Trace pitting edema; adequate peripheral perfusion Skin:      Warm and dry; no rash Neuro: alert and oriented x 3 Psych: normal mood and affect  Sleep Study reviewed: AHI of 0.0  A/P: COPD (chronic obstructive pulmonary disease) (HCC) Stephanie Schultz presents for management of her COPD. Endorsing worsening dyspnea on exertion. May have cardiac component as she has history of diastolic CHF. Will progress on her COPD treatment  - Start trial of Trelegy 1 inhalation daily - Stop Symbicort - C/w albuterol neb as needed for rescue - Chest X-ray today - Referral for pulmonary rehab  Chronic respiratory failure ([place]) Stephanie Schultz presents with chronic respiratory failure on 2.5L home oxygen. Dyspnea on exertion improved when up-titrating oxygen to 3L. Recent sleep study showed nocturnal hypoxemia but no OSA (AHI of 0.0)  - C/w home oxygen at 2.5L  - Up-titrate as needed for dyspnea - Referral for pulm rehab  Chronic allergic rhinitis Endorsing sinus congestion w/o rhinitis on daily Flonase and Claritin 10mg .   - C/w loratidine and flonase - Also recommend normal saline nasal flushes for relief      Gilberto Better, PGY1 [facility] 06/26/2018, 12:00 PM  CC: [name], MD

## 2018-06-26 NOTE — Assessment & Plan Note (Signed)
Stephanie Schultz presents with chronic respiratory failure on 2.5L home oxygen. Dyspnea on exertion improved when up-titrating oxygen to 3L. Recent sleep study showed nocturnal hypoxemia but no OSA (AHI of 0.0)  - C/w home oxygen at 2.5L  - Up-titrate as needed for dyspnea - Referral for pulm rehab

## 2018-06-26 NOTE — Assessment & Plan Note (Signed)
Endorsing sinus congestion w/o rhinitis on daily Flonase and Claritin 10mg .   - C/w loratidine and flonase - Also recommend normal saline nasal flushes for relief

## 2018-06-26 NOTE — Patient Instructions (Addendum)
Stephanie Schultz  Thank you for coming in to the clinic. Here are the recommendations based on what we discussed today:  - We have reviewed your sleep study and determined that you do not need a CPAP for obstructive sleep apnea at this time - STOP symbicort - START Trelegy 1 puff daily. We are giving you a sample to try - We will get Chest X-ray today - We will make a referral for pulmonary rehab - We recommend that you speak to your cardiologist if your symptoms do not improve with these changes - Follow up in 1 month or next available appointment with Dr.Byrum

## 2018-06-26 NOTE — Assessment & Plan Note (Signed)
Mrs.Fenn presents for management of her COPD. Endorsing worsening dyspnea on exertion. May have cardiac component as she has history of diastolic CHF. Will progress on her COPD treatment  - Start trial of Trelegy 1 inhalation daily - Stop Symbicort - C/w albuterol neb as needed for rescue - Chest X-ray today - Referral for pulmonary rehab

## 2018-07-17 ENCOUNTER — Telehealth: Payer: Self-pay | Admitting: Emergency Medicine

## 2018-07-17 MED ORDER — FLUTICASONE-UMECLIDIN-VILANT 100-62.5-25 MCG/INH IN AEPB: 1.0000 | INHALATION_SPRAY | Freq: Every day | RESPIRATORY_TRACT | 3 refills | Status: DC

## 2018-07-17 NOTE — Telephone Encounter (Signed)
Called and spoke with Patient.  She stated that Trelegy is working good for her and she wanted to continue taking it.  Prescription sent to [facility] on [address]. Nothing further at this time.  Per Dr Lamonte Sakai 06/26/18 - START Trelegy 1 puff daily. We are giving you a sample to try

## 2018-07-18 ENCOUNTER — Emergency Department (HOSPITAL_COMMUNITY)
Admission: EM | Admit: 2018-07-18 | Discharge: 2018-07-18 | Disposition: A | Discharge status: Home / Self Care | Payer: Medicaid Other | Attending: Emergency Medicine | Admitting: Emergency Medicine

## 2018-07-18 ENCOUNTER — Encounter (HOSPITAL_COMMUNITY): Payer: Self-pay | Admitting: Emergency Medicine

## 2018-07-18 ENCOUNTER — Other Ambulatory Visit: Payer: Self-pay

## 2018-07-18 ENCOUNTER — Emergency Department (HOSPITAL_COMMUNITY): Payer: Medicaid Other

## 2018-07-18 DIAGNOSIS — Z7982 Long term (current) use of aspirin: Secondary | ICD-10-CM | POA: Insufficient documentation

## 2018-07-18 DIAGNOSIS — Z794 Long term (current) use of insulin: Secondary | ICD-10-CM | POA: Insufficient documentation

## 2018-07-18 DIAGNOSIS — Z79899 Other long term (current) drug therapy: Secondary | ICD-10-CM | POA: Diagnosis not present

## 2018-07-18 DIAGNOSIS — Z8541 Personal history of malignant neoplasm of cervix uteri: Secondary | ICD-10-CM | POA: Insufficient documentation

## 2018-07-18 DIAGNOSIS — R0602 Shortness of breath: Secondary | ICD-10-CM | POA: Diagnosis present

## 2018-07-18 DIAGNOSIS — Z9981 Dependence on supplemental oxygen: Secondary | ICD-10-CM | POA: Diagnosis not present

## 2018-07-18 DIAGNOSIS — E119 Type 2 diabetes mellitus without complications: Secondary | ICD-10-CM | POA: Insufficient documentation

## 2018-07-18 DIAGNOSIS — Z87891 Personal history of nicotine dependence: Secondary | ICD-10-CM | POA: Diagnosis not present

## 2018-07-18 DIAGNOSIS — J441 Chronic obstructive pulmonary disease with (acute) exacerbation: Secondary | ICD-10-CM | POA: Diagnosis not present

## 2018-07-18 DIAGNOSIS — I5032 Chronic diastolic (congestive) heart failure: Secondary | ICD-10-CM | POA: Diagnosis not present

## 2018-07-18 DIAGNOSIS — I110 Hypertensive heart disease with heart failure: Secondary | ICD-10-CM | POA: Diagnosis not present

## 2018-07-18 LAB — BASIC METABOLIC PANEL
ANION GAP: 10 (ref 5–15)
BUN: 7 mg/dL (ref 6–20)
CALCIUM: 9.6 mg/dL (ref 8.9–10.3)
CHLORIDE: 103 mmol/L (ref 98–111)
CO2: 31 mmol/L (ref 22–32)
Creatinine, Ser: 1.22 mg/dL — ABNORMAL HIGH (ref 0.44–1.00)
GFR calc non Af Amer: 48 mL/min — ABNORMAL LOW (ref >60)
GFR, EST AFRICAN AMERICAN: 55 mL/min — AB (ref >60)
Glucose, Bld: 80 mg/dL (ref 70–99)
Potassium: 3.9 mmol/L (ref 3.5–5.1)
SODIUM: 144 mmol/L (ref 135–145)

## 2018-07-18 LAB — CBC
HEMATOCRIT: 35.6 % — AB (ref 36.0–46.0)
HEMOGLOBIN: 11.8 g/dL — AB (ref 12.0–15.0)
MCH: 28.2 pg (ref 26.0–34.0)
MCHC: 33.1 g/dL (ref 30.0–36.0)
MCV: 85.2 fL (ref 78.0–100.0)
Platelets: 247 10*3/uL (ref 150–400)
RBC: 4.18 MIL/uL (ref 3.87–5.11)
RDW: 14.1 % (ref 11.5–15.5)
WBC: 7 10*3/uL (ref 4.0–10.5)

## 2018-07-18 LAB — I-STAT TROPONIN, ED: Troponin i, poc: 0 ng/mL (ref 0.00–0.08)

## 2018-07-18 LAB — I-STAT BETA HCG BLOOD, ED (MC, WL, AP ONLY)

## 2018-07-18 MED ORDER — DOXYCYCLINE HYCLATE 100 MG PO TABS
100.0000 mg | ORAL_TABLET | Freq: Once | ORAL | Status: AC
  Administered 2018-07-18: 100 mg via ORAL
  Filled 2018-07-18: qty 1

## 2018-07-18 MED ORDER — PREDNISONE 10 MG PO TABS: 20.0000 mg | ORAL_TABLET | Freq: Every day | ORAL | 0 refills | Status: DC

## 2018-07-18 MED ORDER — DOXYCYCLINE HYCLATE 100 MG PO CAPS: 100.0000 mg | ORAL_CAPSULE | Freq: Two times a day (BID) | ORAL | 0 refills | Status: DC

## 2018-07-18 MED ORDER — PREDNISONE 20 MG PO TABS
60.0000 mg | ORAL_TABLET | Freq: Once | ORAL | Status: AC
  Administered 2018-07-18: 60 mg via ORAL
  Filled 2018-07-18: qty 3

## 2018-07-18 MED ORDER — ALBUTEROL SULFATE (2.5 MG/3ML) 0.083% IN NEBU
5.0000 mg | INHALATION_SOLUTION | Freq: Once | RESPIRATORY_TRACT | Status: AC
  Administered 2018-07-18: 5 mg via RESPIRATORY_TRACT
  Filled 2018-07-18: qty 6

## 2018-07-18 NOTE — ED Notes (Signed)
Patient verbalizes understanding of discharge instructions. Opportunity for questioning and answers were provided. Armband removed by staff, pt discharged from ED.  

## 2018-07-18 NOTE — ED Triage Notes (Signed)
Pt presents to ED for SOB x 3 days with exertion, then today worsening to at rest.  Hx of CHF, patient presents on 3L [place].  Non-pitting edema noted in patient's bilateral ankles.  Patient denies weight gain.  States she was started on a new inhaler a few weeks ago and her breathing has been better, then she had the sudden onset of worsening.  Pt states down to 88% SpO2 at home with ambulation even on O2.  Patient c/o tightness in upper chest/throat.

## 2018-07-18 NOTE — ED Provider Notes (Signed)
[facility] Provider Note   CSN: [id number] Arrival date & time: 07/18/18  1239     History   Chief Complaint Chief Complaint  Patient presents with  . Shortness of Breath    HPI Stephanie Schultz is a 62 y.o. female.  HPI  62 yo female copd on oxygen presents complaining of cough with thick sputum, nasal congestion, sore throat.  Saw Dr. Jonelle Sidle on Wednesday for regular and did not mention cough.  Using regular inhalers and uses nebulizer every 3 hours but continues dyspneic.  State she was on abx for a abscess on left shoulder and was treated with sulfa in  August.   Past Medical History:  Diagnosis Date  . Anemia    chronis  . Anxiety   . Arthritis   . Cervical cancer ([place]) 1990   cervical   . Chronic diastolic CHF (congestive heart failure) ([place]) 08/10/2016  . COPD (chronic obstructive pulmonary disease) ([place])    O2 dependent. Pulmo: [name]  . Diabetes mellitus without complication ([place])    a. A1c 8.3 in 11/2015  . Emphysema   . Gallstones    s/p cholecystectomy  . GERD (gastroesophageal reflux disease)   . Gout   . History of home oxygen therapy    "2.5L; 24/7" (08/10/2016)  . Hx of cardiac catheterization    a. LHC at [facility] in [place], [place] 09/2008:  Normal coronary arteries EF 70%. b. LHC 11/2016: normal cors, normal LVEDP, EF 55-65%.  . Hyperlipidemia   . Hypertension   . Morbid obesity ([place])   . OSA on CPAP    CPAP at night   . Pneumonia   . Sickle cell trait ([place])   . Supplemental oxygen dependent    2L CONTINUOUSLEY  . Tobacco abuse    a. up to 3ppd from age 31 to 77, now 1/4 ppd (01/2013) >> Quit 10/2015    Patient Active Problem List   Diagnosis Date Noted  . Dyspnea 08/23/2017  . Chronic diastolic CHF (congestive heart failure) ([place]) 08/10/2016  . Acute respiratory failure ([place]) 07/20/2016  . S/P laparoscopic cholecystectomy 12/13/2015  . Chronic respiratory failure ([place]) 03/31/2015  . Rib fracture  03/31/2015  . Acute respiratory failure with hypoxia ([place]) 02/17/2015  . Arthritis 12/13/2014  . Gout 03/03/2014  . Diabetic neuropathy ([place]) 03/03/2014  . Carpal tunnel syndrome 03/03/2014  . DM type 2 (diabetes mellitus, type 2) ([place]) 05/22/2013  . Anxiety 05/22/2013  . Transaminasemia 05/09/2013  . Leukocytosis 04/27/2013  . Tobacco abuse   . Chest pain   . Sickle cell trait ([place])   . Morbid obesity ([place])   . Acute on chronic respiratory failure ([place]) 01/16/2013  . Diastolic dysfunction 51/76/1607  . Hoarseness 12/22/2012  . Epistaxis, recurrent 07/08/2012  . GERD (gastroesophageal reflux disease) 09/20/2011  . Pulmonary nodule 09/20/2011  . COPD (chronic obstructive pulmonary disease) ([place]) 04/04/2011  . Diabetes mellitus type 2, controlled ([place]) 04/04/2011  . Hypertension 04/04/2011  . Chronic allergic rhinitis 04/04/2011    Past Surgical History:  Procedure Laterality Date  . BREAST BIOPSY    . CARDIAC CATHETERIZATION    . CARDIAC CATHETERIZATION N/A 12/03/2016   Procedure: Left Heart Cath and Coronary Angiography;  Surgeon: Peter M Martinique, MD;  Location: [facility];  Service: Cardiovascular;  Laterality: N/A;  . CHOLECYSTECTOMY N/A 12/13/2015   Procedure: LAPAROSCOPIC CHOLECYSTECTOMY;  Surgeon: Ralene Ok, MD;  Location: [facility];  Service: General;  Laterality: N/A;  .  COLONOSCOPY  09/05/2012   Procedure: COLONOSCOPY;  Surgeon: Beryle Beams, MD;  Location: [facility];  Service: Endoscopy;  Laterality: N/A;  . COLONOSCOPY WITH PROPOFOL N/A 09/02/2015   Procedure: COLONOSCOPY WITH PROPOFOL;  Surgeon: Carol Ada, MD;  Location: [facility];  Service: Endoscopy;  Laterality: N/A;  . ESOPHAGOGASTRODUODENOSCOPY (EGD) WITH PROPOFOL N/A 03/29/2017   Procedure: ESOPHAGOGASTRODUODENOSCOPY (EGD) WITH PROPOFOL;  Surgeon: Carol Ada, MD;  Location: [facility];  Service: Endoscopy;  Laterality: N/A;  . TUBAL LIGATION       OB History   None      Home  Medications    Prior to Admission medications   Medication Sig Start Date End Date Taking? Authorizing Provider  albuterol (PROVENTIL) (2.5 MG/3ML) 0.083% nebulizer solution Take 3 mLs (2.5 mg total) by nebulization 4 (four) times daily as needed for wheezing or shortness of breath. 12/16/17   Baird Lyons D, MD  allopurinol (ZYLOPRIM) 100 MG tablet Take 100 mg by mouth daily.    [provider]  ALPRAZolam Duanne Moron) 1 MG tablet Take 1 mg by mouth at bedtime as needed for sleep.  04/13/16   [provider]  aspirin EC 81 MG tablet Take 81 mg by mouth daily.    [provider]  budesonide-formoterol (SYMBICORT) 160-4.5 MCG/ACT inhaler Inhale 2 puffs into the lungs 2 (two) times daily. 05/03/15   Collene Gobble, MD  Cholecalciferol (VITAMIN D PO) Take 1 tablet by mouth daily.    [provider]  clobetasol cream (TEMOVATE) 0.99 % Apply 1 application topically daily as needed (irritation from pull ups).  03/25/17   [provider]  fluticasone (FLONASE) 50 MCG/ACT nasal spray Place 1 spray into both nostrils daily as needed (congestion). 08/26/17   Aline August, MD  Fluticasone-Umeclidin-Vilant (TRELEGY ELLIPTA) 100-62.5-25 MCG/INH AEPB Inhale 1 puff into the lungs daily. 07/17/18   Collene Gobble, MD  furosemide (LASIX) 40 MG tablet Take 1 tablet in the morning and 0.5 tablet in the evening 04/23/18   Turner, Eber Hong, MD  glipiZIDE (GLUCOTROL) 10 MG tablet Take 10 mg by mouth 2 (two) times daily before a meal.     [provider]  guaiFENesin (MUCINEX) 600 MG 12 hr tablet Take 1 tablet (600 mg total) by mouth 2 (two) times daily. 08/26/17   Aline August, MD  HYDROcodone-acetaminophen (NORCO) 7.5-325 MG tablet Take 1 tablet by mouth every 6 (six) hours as needed for moderate pain.    [provider]  insulin glargine (LANTUS) 100 unit/mL SOPN Inject 18 Units into the skin at bedtime.     [provider]  Linaclotide Rolan Lipa) 145 MCG  CAPS capsule Take 145 mcg by mouth daily.     [provider]  loratadine (CLARITIN) 10 MG tablet Take 10 mg by mouth daily.    [provider]  metoprolol succinate (TOPROL-XL) 50 MG 24 hr tablet Take 50 mg by mouth daily. Take with or immediately following a meal.    [provider]  metoprolol tartrate (LOPRESSOR) 50 MG tablet Take 1 tablet (50 mg total) by mouth daily. 05/22/18   Sueanne Margarita, MD  Naphazoline HCl (CLEAR EYES OP) Place 1 drop into both eyes daily.    [provider]  ondansetron (ZOFRAN ODT) 4 MG disintegrating tablet Take 1 tablet (4 mg total) by mouth every 8 (eight) hours as needed for nausea or vomiting. 03/01/17   Blanchie Dessert, MD  OXYGEN Inhale 1-2 L into the lungs continuous. 1  L at night and 2 L during the day    [provider]  pantoprazole (PROTONIX) 40 MG tablet Take 1 tablet (40 mg total) by mouth daily. 08/26/17   Aline August, MD  Potassium Chloride ER 20 MEQ TBCR Take 20 mEq by mouth daily. 12/04/16   Arbutus Leas, NP  potassium chloride SA (K-DUR,KLOR-CON) 20 MEQ tablet TAKE 1 TABLET IN THE MORNING. 04/23/18   Sueanne Margarita, MD  pravastatin (PRAVACHOL) 20 MG tablet Take 20 mg by mouth every evening.     [provider]  PROAIR HFA 108 (90 Base) MCG/ACT inhaler INHALE 2 PUFFS BY MOUTH EVERY 4 HOURS AS NEEDED FOR WHEEZING 06/14/16   Byrum, Rose Fillers, MD  ranitidine (ZANTAC 75) 75 MG tablet Take 1 tablet (75 mg total) 2 (two) times daily by mouth. 09/18/17   Lyda Jester M, PA-C  sitaGLIPtin (JANUVIA) 100 MG tablet Take 100 mg by mouth daily.    [provider]  spironolactone (ALDACTONE) 25 MG tablet Take 0.5 tablets (12.5 mg total) by mouth daily. 12/05/16   Arbutus Leas, NP  zolpidem (AMBIEN) 10 MG tablet Take 10 mg by mouth at bedtime.    [provider]    Family History Family History  Problem Relation Age of Onset  . Other Father        unaware of father's medical history    . Diabetes Mother        alive @ 94  . Myasthenia gravis Mother   . Lung cancer Paternal Aunt   . Lung cancer Paternal Grandfather   . Other Unknown        multiple siblings a&w.  . Heart attack Neg Hx   . Heart failure Neg Hx     Social History Social History   Tobacco Use  . Smoking status: Former Smoker    Packs/day: 0.50    Years: 36.00    Pack years: 18.00    Types: Cigarettes    Last attempt to quit: 11/11/2015    Years since quitting: 2.6  . Smokeless tobacco: Never Used  . Tobacco comment: Approx 90 pk-yrs (up to 3ppd until ~ 2009). Smoking 3 cigs per day now.  Substance Use Topics  . Alcohol use: No    Alcohol/week: 0.0 standard drinks  . Drug use: No     Allergies   Patient has no known allergies.   Review of Systems Review of Systems   Physical Exam Updated Vital Signs BP (!) 116/58   Pulse 68   Temp 98.8 F (37.1 C) (Oral)   Resp 17   LMP 06/04/2000 (LMP Unknown) Comment: tubal ligation  SpO2 96%   Physical Exam  Constitutional: She appears well-developed and well-nourished.  Morbidly obese female on oxygen sitting on the bed who does not appear to be in distress  HENT:  Head: Normocephalic and atraumatic.  Mouth/Throat: Oropharynx is clear and moist.  Eyes: Pupils are equal, round, and reactive to light.  Neck: Normal range of motion. Neck supple.  Cardiovascular: Normal rate and regular rhythm.  Pulmonary/Chest: Effort normal. She has decreased breath sounds in the right lower field and the left lower field.  Abdominal: Soft. Bowel sounds are normal.  Musculoskeletal: Normal range of motion.       Right lower leg: Normal. She exhibits no edema.       Left lower leg: She exhibits no edema.  Neurological: She is alert.  Skin: Skin is warm and dry. Capillary refill  takes less than 2 seconds.  Psychiatric: She has a normal mood and affect.  Nursing note and vitals reviewed.    ED Treatments / Results  Labs (all labs ordered are  listed, but only abnormal results are displayed) Labs Reviewed  BASIC METABOLIC PANEL - Abnormal; Notable for the following components:      Result Value   Creatinine, Ser 1.22 (*)    GFR calc non Af Amer 48 (*)    GFR calc Af Amer 55 (*)    All other components within normal limits  CBC - Abnormal; Notable for the following components:   Hemoglobin 11.8 (*)    HCT 35.6 (*)    All other components within normal limits  I-STAT TROPONIN, ED  I-STAT BETA HCG BLOOD, ED ([facility], [facility], [facility] ONLY)    EKG EKG Interpretation  Date/Time:  Friday July 18 2018 12:44:58 EDT Ventricular Rate:  73 PR Interval:  150 QRS Duration: 72 QT Interval:  414 QTC Calculation: 456 R Axis:   76 Text Interpretation:  Normal sinus rhythm Normal ECG Confirmed by Pattricia Boss [phone]) on 07/18/2018 5:02:39 PM   Radiology Dg Chest 2 View  Result Date: 07/18/2018 CLINICAL DATA:  SOB with exertion/sore throat x 2 days, productive cough x 3 days, HTN, diabetic, past smoker - quit 4 years ago EXAM: CHEST - 2 VIEW COMPARISON:  06/26/2018 FINDINGS: Lungs are well inflated to slightly hyperinflated. Streaky opacities are identified in the LEFT lung base, most likely representing scarring/atelectasis. No focal consolidations or pleural effusions. No pulmonary edema. Remote LEFT rib fractures. Degenerative changes are seen in thoracic spine. IMPRESSION: Chronic changes at the bases, LEFT greater than RIGHT. No evidence for acute abnormality. Electronically Signed   By: Nolon Nations M.D.   On: 07/18/2018 13:23    Procedures Procedures (including critical care time)  Medications Ordered in ED Medications - No data to display   Initial Impression / Assessment and Plan / ED Course  I have reviewed the triage vital signs and the nursing notes.  Pertinent labs & imaging results that were available during my care of the patient were reviewed by me and considered in my medical decision making (see chart for details).      62 year old female with COPD who comes in today with some exacerbation.  Change in her sputum.  She is not febrile and has no pneumonia noted on her chest x-Kjuan Seipp.  She has chronic changes at the bases.  Chest x-Orlen Leedy was reviewed.  KG is normal. Labs appear normal stable from prior with minor anemia.  Patient treated with albuterol nebulizer.  Her sats have remained in the upper 90s on her usual home oxygen of 2.5 L.  Plan prednisone for 5 days.  She is given dose here in ED.  Will she will be started on doxycycline.  We have discussed return precautions and need for follow-up and she voices understanding. Final Clinical Impressions(s) / ED Diagnoses   Final diagnoses:  COPD exacerbation [facility])    ED Discharge Orders         Ordered    predniSONE (DELTASONE) 10 MG tablet  Daily     07/18/18 1705    doxycycline (VIBRAMYCIN) 100 MG capsule  2 times daily     07/18/18 1705           Pattricia Boss, MD 07/18/18 1706

## 2018-07-18 NOTE — Discharge Instructions (Addendum)
Please take prescriptions as prescribed Return if you are having worsening ability to breathe, high fever, or not able to tolerate your medications by mouth Recheck with your doctor in 2 to 3 days

## 2018-07-21 ENCOUNTER — Encounter: Payer: Self-pay | Admitting: Emergency Medicine

## 2018-07-21 ENCOUNTER — Telehealth: Payer: Self-pay | Admitting: Emergency Medicine

## 2018-07-21 NOTE — Telephone Encounter (Signed)
Called patient, unable to reach left message to give us a call back. 

## 2018-07-21 NOTE — Telephone Encounter (Signed)
Pt returned call [phone]

## 2018-07-21 NOTE — Telephone Encounter (Signed)
Spoke with pt, states that pharmacy is requiring a PA for Trelegy to be dispensed. Pt has medicaid- called [employer] to initiate PA, PA is approved through 07/16/2019 PA# [id number]  Pharmacy and pt aware.  Nothing further needed.

## 2018-07-29 ENCOUNTER — Ambulatory Visit: Payer: Medicaid Other | Admitting: Emergency Medicine

## 2018-07-29 ENCOUNTER — Encounter: Payer: Self-pay | Admitting: Emergency Medicine

## 2018-07-29 DIAGNOSIS — J9611 Chronic respiratory failure with hypoxia: Secondary | ICD-10-CM

## 2018-07-29 DIAGNOSIS — Z23 Encounter for immunization: Secondary | ICD-10-CM | POA: Diagnosis not present

## 2018-07-29 DIAGNOSIS — J449 Chronic obstructive pulmonary disease, unspecified: Secondary | ICD-10-CM

## 2018-07-29 DIAGNOSIS — J309 Allergic rhinitis, unspecified: Secondary | ICD-10-CM

## 2018-07-29 NOTE — Progress Notes (Signed)
Subjective:    Patient ID: Stephanie Schultz, female    DOB: July 18, 1956, 62 y.o.   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  She had obstructive sleep apnea and was on CPAP but this was discontinued after a reassuring sleep study a year ago (elsewhere).  She has been having difficulty for the last 1-2 months with increased cough, wheeze.  She has been treated with prednisone twice over that time.  I saw her 9 days ago and increased her Lasix for 3 days.  She believes that the lasix helped her breathing. She is managed with Spiriva, Symbicort, loratadine, nasal saline spray.  She has not tolerated Flonase due to nose bleeding. She returns today reporting that she has improved some. She is experiencing chronic hoarseness. She experienced an acute episode dyspnea 4/11, took xanax and felt that it helped her. She is on omeprazole (med list says protonix) has breakthrough GERD sx. She is having frequent diarrhea.  She still describes sleep disturbance, snoring.    ROV 04/10/18 -- 61 year old woman with obesity, COPD with an asthmatic component.  Obstructive sleep apnea.  We will arrange for a split-night sleep study which was done on 03/16/2018.  This shows significant difficulty with sleep maintenance with only 48 minutes of total sleep time.  There were no apneas captured.  No significant limb movements. She says that she had trouble tolerating the wires, etc. She did take her Lorrin Mais before the study but didn't work. She has seen cardiology and they like the lasix dose of 60mg  total daily. She is on Spiriva and Symbicort.   ROV 07/29/18 --Mrs. 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  She is been treated in the past  with for sleep apnea but her most recent PSG showed only nocturnal hypoxemia without overt obstructive apneas.  At her last visit I changed her Symbicort to Trelegy to see if she would get benefit.  She was unfortunately seen in the emergency department 9/6 with more nasal congestion, sore throat, thick sputum and increased albuterol use.  She was treated with 5 days of prednisone, doxycycline. She is feeling better now. She believes that the Trelegy has been beneficial, her breathing and exertional tolerance is better. She is using 2-3L/min o2. She uses albuterol about 4-5x a day, appears to be using on a schedule.   ROS  See history of present illness   Objective:   Physical Exam Vitals:   07/29/18 0934  BP: 134/60  Pulse: 85  SpO2: 95%  Weight: 224 lb (101.6 kg)  Height: 5' (1.524 m)    Gen: Obese, in no distress,  normal affect  ENT: No lesions,  mouth clear,  oropharynx clear, no postnasal drip, strong voice today  Neck: No JVD, no stridor  Lungs: No use of accessory muscles, distant, no wheezes   Cardiovascular: RRR, heart sounds normal, no murmur or gallops, no peripheral edema  Musculoskeletal: No deformities, no cyanosis or clubbing  Neuro: alert, non focal  Skin: Warm, no lesions or rashes, old scars on B UE's   Assessment & Plan:   Chronic respiratory failure (HCC) Continue oxygen at 2 to 3 L/min depending on level of exertion.  2L/min at night  COPD (chronic obstructive pulmonary disease) ([place]) Please continue Trelegy  once daily as you have been taking it.  Remember rinse and gargle after using. Keep your albuterol available to use 1 nebulizer treatment up to every 4 hours if needed for shortness of breath, wheezing, chest tightness.  You do not need to take this medication on a schedule.  Chronic allergic rhinitis Continue your loratadine daily and fluticasone nasal spray 2 sprays each nostril as needed for congestion Flu shot today Follow with Dr Lamonte Sakai in 6  months or sooner if you have any problems  [name], MD, PhD 07/29/2018, 9:52 AM [facility] [phone] or if no answer [phone]

## 2018-07-29 NOTE — Assessment & Plan Note (Addendum)
Continue oxygen at 2 to 3 L/min depending on level of exertion.  2L/min at night

## 2018-07-29 NOTE — Assessment & Plan Note (Signed)
Please continue Trelegy once daily as you have been taking it.  Remember rinse and gargle after using. Keep your albuterol available to use 1 nebulizer treatment up to every 4 hours if needed for shortness of breath, wheezing, chest tightness.  You do not need to take this medication on a schedule.

## 2018-07-29 NOTE — Assessment & Plan Note (Signed)
Continue your loratadine daily and fluticasone nasal spray 2 sprays each nostril as needed for congestion Flu shot today Follow with Dr Lamonte Sakai in 6 months or sooner if you have any problems

## 2018-07-29 NOTE — Patient Instructions (Addendum)
Please continue Trelegy once daily as you have been taking it.  Remember rinse and gargle after using. Keep your albuterol available to use 1 nebulizer treatment up to every 4 hours if needed for shortness of breath, wheezing, chest tightness.  You do not need to take this medication on a schedule. Continue your oxygen at 2 to 3 L/min. Continue your loratadine daily and fluticasone nasal spray 2 sprays each nostril as needed for congestion Flu shot today Follow with Dr Lamonte Sakai in 6 months or sooner if you have any problems

## 2018-08-07 ENCOUNTER — Other Ambulatory Visit: Payer: Self-pay | Admitting: Internal Medicine

## 2018-08-07 DIAGNOSIS — Z1231 Encounter for screening mammogram for malignant neoplasm of breast: Secondary | ICD-10-CM

## 2018-08-08 ENCOUNTER — Ambulatory Visit: Payer: Medicaid Other | Admitting: Podiatry

## 2018-09-10 ENCOUNTER — Ambulatory Visit (INDEPENDENT_AMBULATORY_CARE_PROVIDER_SITE_OTHER): Payer: Medicaid Other | Admitting: Podiatry

## 2018-09-10 ENCOUNTER — Encounter: Payer: Self-pay | Admitting: Podiatry

## 2018-09-10 DIAGNOSIS — M79609 Pain in unspecified limb: Principal | ICD-10-CM

## 2018-09-10 DIAGNOSIS — E119 Type 2 diabetes mellitus without complications: Secondary | ICD-10-CM

## 2018-09-10 DIAGNOSIS — B351 Tinea unguium: Secondary | ICD-10-CM

## 2018-09-10 DIAGNOSIS — Q828 Other specified congenital malformations of skin: Secondary | ICD-10-CM

## 2018-09-10 DIAGNOSIS — M79676 Pain in unspecified toe(s): Secondary | ICD-10-CM

## 2018-09-10 NOTE — Progress Notes (Signed)
Complaint:  Visit Type: Patient returns to my office for continued preventative foot care services. Complaint: Patient states" my nails have grown long and thick and become painful to walk and wear shoes" Patient has been diagnosed with DM with no foot complications. The patient presents for preventative foot care services. No changes to ROS.  Patient was dispensed a plantar fascia brace which has help relieve pain in her left foot.  Podiatric Exam: Vascular: dorsalis pedis and posterior tibial pulses are palpable bilateral. Capillary return is immediate. Temperature gradient is WNL. Skin turgor WNL  Sensorium: Normal Semmes Weinstein monofilament test. Normal tactile sensation bilaterally. Nail Exam: Pt has thick disfigured discolored nails with subungual debris noted bilateral entire nail hallux through fifth toenails Ulcer Exam: There is no evidence of ulcer or pre-ulcerative changes or infection. Orthopedic Exam: Muscle tone and strength are WNL. No limitations in general ROM. No crepitus or effusions noted. Foot type and digits show no abnormalities. Bony prominences are unremarkable. Skin: No Porokeratosis. No infection or ulcers.  Porokeratosis sub 5  left  Diagnosis:  Onychomycosis, , Pain in right toe, pain in left toes  Treatment & Plan Procedures and Treatment: Consent by patient was obtained for treatment procedures.   Debridement of mycotic and hypertrophic toenails, 1 through 5 bilateral and clearing of subungual debris. No ulceration, no infection noted. Debride porokeratosis left.   Return Visit-Office Procedure: Patient instructed to return to the office for a follow up visit 3 months for continued evaluation and treatment.    Gardiner Barefoot DPM

## 2018-09-12 ENCOUNTER — Ambulatory Visit
Admission: RE | Admit: 2018-09-12 | Discharge: 2018-09-12 | Disposition: A | Discharge status: Home / Self Care | Payer: Medicaid Other | Source: Ambulatory Visit | Attending: Internal Medicine | Admitting: Internal Medicine

## 2018-09-12 DIAGNOSIS — Z1231 Encounter for screening mammogram for malignant neoplasm of breast: Secondary | ICD-10-CM

## 2018-09-18 ENCOUNTER — Telehealth: Payer: Self-pay | Admitting: Emergency Medicine

## 2018-09-18 MED ORDER — FLUTICASONE-UMECLIDIN-VILANT 100-62.5-25 MCG/INH IN AEPB: 1.0000 | INHALATION_SPRAY | Freq: Every day | RESPIRATORY_TRACT | 3 refills | Status: DC

## 2018-09-18 NOTE — Telephone Encounter (Signed)
Pt was requesting her Rx for Trelegy be sent to [facility]. Advised her I would send it. Nothing further is needed.

## 2018-09-22 ENCOUNTER — Telehealth: Payer: Self-pay | Admitting: Cardiology

## 2018-09-22 ENCOUNTER — Encounter: Payer: Self-pay | Admitting: Cardiology

## 2018-09-22 ENCOUNTER — Ambulatory Visit: Payer: Medicaid Other | Admitting: Cardiology

## 2018-09-22 VITALS — BP 104/68 | HR 68 | Ht 60.0 in | Wt 222.0 lb

## 2018-09-22 DIAGNOSIS — I5022 Chronic systolic (congestive) heart failure: Secondary | ICD-10-CM | POA: Diagnosis not present

## 2018-09-22 MED ORDER — METOPROLOL SUCCINATE ER 50 MG PO TB24: 50.0000 mg | ORAL_TABLET | Freq: Every day | ORAL | 3 refills | Status: DC

## 2018-09-22 NOTE — Progress Notes (Signed)
09/22/2018 Stephanie Schultz   01/29/1956  [id number]  Primary Physician [name], MD Primary Cardiologist: Dr. Radford Pax Electrophysiologist: N/A  Reason for Visit/CC: 6 Month F/u for Chronic Diastolic HF  HPI:  Stephanie Schultz is a 62 y/o AAF, followed by Dr. Radford Pax.Her PMH is significant forDM2, HTN, hyperlipidemia, COPD with chronic respiratory failure on chronic O2, OSA on CPAP, prior tobacco abuse, chronic diastolic CHF and morbid obesity. She had a cath in 2009 showing normal coronary arteries and normal LVF. She had recurrent CP and underwent repeat LHC 12/03/16 with normal coronaries, EF 55-65%, normal LVEDP.Based on symptoms, increased O2 requirements and increased sputum production, she was felt to have an acute COPD exacerbation and was treated with improvement in symptoms.  She is here today for 6 month f/u. Since her last OV in May, she was treated in the ED in September for an acute COPD exacerbation and was treated with steroid taper and antibiotics. She has done fairly well from a cardiac standpoint. No chest pain. Her respiratory status is stable. She is on 2.5 L [place] at rest and uses 3L w/ activity. She denies LEE. She is compliant w/ daily weights at home. Her weight has remained stable.  She tries to adhere to a low sodium diet.   Current Meds  Medication Sig  . albuterol (PROVENTIL) (2.5 MG/3ML) 0.083% nebulizer solution Take 3 mLs (2.5 mg total) by nebulization 4 (four) times daily as needed for wheezing or shortness of breath.  . allopurinol (ZYLOPRIM) 100 MG tablet Take 100 mg by mouth daily.  Marland Kitchen ALPRAZolam (XANAX) 1 MG tablet Take 1 mg by mouth at bedtime as needed for sleep.   Marland Kitchen aspirin EC 81 MG tablet Take 81 mg by mouth daily.  . Cholecalciferol (VITAMIN D PO) Take 1 tablet by mouth daily.  . clobetasol cream (TEMOVATE) 4.01 % Apply 1 application topically daily as needed (irritation from pull ups).   . doxycycline (VIBRAMYCIN) 100 MG capsule Take 1  capsule (100 mg total) by mouth 2 (two) times daily.  . fluticasone (FLONASE) 50 MCG/ACT nasal spray Place 1 spray into both nostrils daily as needed (congestion).  . Fluticasone-Umeclidin-Vilant (TRELEGY ELLIPTA) 100-62.5-25 MCG/INH AEPB Inhale 1 puff into the lungs daily.  . furosemide (LASIX) 40 MG tablet Take 1 tablet in the morning and 0.5 tablet in the evening  . glipiZIDE (GLUCOTROL) 10 MG tablet Take 10 mg by mouth 2 (two) times daily before a meal.   . guaiFENesin (MUCINEX) 600 MG 12 hr tablet Take 1 tablet (600 mg total) by mouth 2 (two) times daily.  Marland Kitchen HYDROcodone-acetaminophen (NORCO) 7.5-325 MG tablet Take 1 tablet by mouth every 6 (six) hours as needed for moderate pain.  Marland Kitchen insulin glargine (LANTUS) 100 unit/mL SOPN Inject 18 Units into the skin at bedtime.   . Linaclotide (LINZESS) 145 MCG CAPS capsule Take 145 mcg by mouth daily.   Marland Kitchen loratadine (CLARITIN) 10 MG tablet Take 10 mg by mouth daily.  . metoprolol succinate (TOPROL-XL) 50 MG 24 hr tablet Take 50 mg by mouth daily. Take with or immediately following a meal.  . metoprolol tartrate (LOPRESSOR) 50 MG tablet Take 1 tablet (50 mg total) by mouth daily.  . Naphazoline HCl (CLEAR EYES OP) Place 1 drop into both eyes daily.  . ondansetron (ZOFRAN ODT) 4 MG disintegrating tablet Take 1 tablet (4 mg total) by mouth every 8 (eight) hours as needed for nausea or vomiting.  . OXYGEN Inhale 1-2 L into the  lungs continuous. 1 L at night and 2 L during the day  . pantoprazole (PROTONIX) 40 MG tablet Take 1 tablet (40 mg total) by mouth daily.  . Potassium Chloride ER 20 MEQ TBCR Take 20 mEq by mouth daily.  . potassium chloride SA (K-DUR,KLOR-CON) 20 MEQ tablet TAKE 1 TABLET IN THE MORNING.  . pravastatin (PRAVACHOL) 20 MG tablet Take 20 mg by mouth every evening.   Marland Kitchen PROAIR HFA 108 (90 Base) MCG/ACT inhaler INHALE 2 PUFFS BY MOUTH EVERY 4 HOURS AS NEEDED FOR WHEEZING  . ranitidine (ZANTAC 75) 75 MG tablet Take 1 tablet (75 mg total) 2  (two) times daily by mouth.  . sitaGLIPtin (JANUVIA) 100 MG tablet Take 100 mg by mouth daily.  Marland Kitchen spironolactone (ALDACTONE) 25 MG tablet Take 0.5 tablets (12.5 mg total) by mouth daily.  Marland Kitchen zolpidem (AMBIEN) 10 MG tablet Take 10 mg by mouth at bedtime.   Allergies  Allergen Reactions  . Acetaminophen Other (See Comments)    Pt was told that she could not take Tylenol, does not know why Pt was told that she could not take Tylenol, does not know why   Past Medical History:  Diagnosis Date  . Anemia    chronis  . Anxiety   . Arthritis   . Cervical cancer ([place]) 1990   cervical   . Chronic diastolic CHF (congestive heart failure) ([place]) 08/10/2016  . COPD (chronic obstructive pulmonary disease) ([place])    O2 dependent. Pulmo: [name]  . Diabetes mellitus without complication ([place])    a. A1c 8.3 in 11/2015  . Emphysema   . Gallstones    s/p cholecystectomy  . GERD (gastroesophageal reflux disease)   . Gout   . History of home oxygen therapy    "2.5L; 24/7" (08/10/2016)  . Hx of cardiac catheterization    a. LHC at [facility] [place] [place] 09/2008:  Normal coronary arteries EF 70%. b. LHC 11/2016: normal cors, normal LVEDP, EF 55-65%.  . Hyperlipidemia   . Hypertension   . Morbid obesity ([place])   . OSA on CPAP    CPAP at night   . Pneumonia   . Sickle cell trait ([place])   . Supplemental oxygen dependent    2L CONTINUOUSLEY  . Tobacco abuse    a. up to 3ppd from age 4 to 41, now 1/4 ppd (01/2013) >> Quit 10/2015   Family History  Problem Relation Age of Onset  . Other Father        unaware of father's medical history  . Diabetes Mother        alive @ 69  . Myasthenia gravis Mother   . Lung cancer Paternal Aunt   . Lung cancer Paternal Grandfather   . Other Unknown        multiple siblings a&w.  . Heart attack Neg Hx   . Heart failure Neg Hx   . Breast cancer Neg Hx    Past Surgical History:  Procedure Laterality Date  . BREAST BIOPSY    . CARDIAC CATHETERIZATION    .  CARDIAC CATHETERIZATION N/A 12/03/2016   Procedure: Left Heart Cath and Coronary Angiography;  Surgeon: Peter M Martinique, MD;  Location: [facility];  Service: Cardiovascular;  Laterality: N/A;  . CHOLECYSTECTOMY N/A 12/13/2015   Procedure: LAPAROSCOPIC CHOLECYSTECTOMY;  Surgeon: Ralene Ok, MD;  Location: [facility];  Service: General;  Laterality: N/A;  . COLONOSCOPY  09/05/2012   Procedure: COLONOSCOPY;  Surgeon: Beryle Beams, MD;  Location: [facility];  Service: Endoscopy;  Laterality: N/A;  . COLONOSCOPY WITH PROPOFOL N/A 09/02/2015   Procedure: COLONOSCOPY WITH PROPOFOL;  Surgeon: Carol Ada, MD;  Location: [facility];  Service: Endoscopy;  Laterality: N/A;  . ESOPHAGOGASTRODUODENOSCOPY (EGD) WITH PROPOFOL N/A 03/29/2017   Procedure: ESOPHAGOGASTRODUODENOSCOPY (EGD) WITH PROPOFOL;  Surgeon: Carol Ada, MD;  Location: [facility];  Service: Endoscopy;  Laterality: N/A;  . TUBAL LIGATION     Social History   Socioeconomic History  . Marital status: Divorced    Spouse name: Not on file  . Number of children: 3  . Years of education: 11th  . Highest education level: Not on file  Occupational History  . Occupation: Disabled    Comment: Emphysema  Social Needs  . Financial resource strain: Not on file  . Food insecurity:    Worry: Not on file    Inability: Not on file  . Transportation needs:    Medical: Not on file    Non-medical: Not on file  Tobacco Use  . Smoking status: Former Smoker    Packs/day: 0.50    Years: 36.00    Pack years: 18.00    Types: Cigarettes    Last attempt to quit: 11/11/2015    Years since quitting: 2.8  . Smokeless tobacco: Never Used  . Tobacco comment: Approx 90 pk-yrs (up to 3ppd until ~ 2009). Smoking 3 cigs per day now.  Substance and Sexual Activity  . Alcohol use: No    Alcohol/week: 0.0 standard drinks  . Drug use: No  . Sexual activity: Yes  Lifestyle  . Physical activity:    Days per week: Not on file    Minutes per  session: Not on file  . Stress: Not on file  Relationships  . Social connections:    Talks on phone: Not on file    Gets together: Not on file    Attends religious service: Not on file    Active member of club or organization: Not on file    Attends meetings of clubs or organizations: Not on file    Relationship status: Not on file  . Intimate partner violence:    Fear of current or ex partner: Not on file    Emotionally abused: Not on file    Physically abused: Not on file    Forced sexual activity: Not on file  Other Topics Concern  . Not on file  Social History Narrative   From [place], [place].  Moved to [place] about 8 years ago to be closer to her daughter. She moved back to [place] about a year and a half ago. She lives by herself. She does not routinely exercise or adhere to any particular diet.   Caffeine Use: 1-2 cups daily   Disabled; Previously in home care     Review of Systems: General: negative for chills, fever, night sweats or weight changes.  Cardiovascular: negative for chest pain, dyspnea on exertion, edema, orthopnea, palpitations, paroxysmal nocturnal dyspnea or shortness of breath Dermatological: negative for rash Respiratory: negative for cough or wheezing Urologic: negative for hematuria Abdominal: negative for nausea, vomiting, diarrhea, bright red blood per rectum, melena, or hematemesis Neurologic: negative for visual changes, syncope, or dizziness All other systems reviewed and are otherwise negative except as noted above.   Physical Exam:  Blood pressure 104/68, pulse 68, height 5' (1.524 m), weight 222 lb (100.7 kg), last menstrual period 06/04/2000, SpO2 93 %.  General appearance: alert, cooperative, no distress and morbidly  obese Neck: no carotid bruit and no JVD Lungs: clear to auscultation bilaterally Heart: regular rate and rhythm, S1, S2 normal, no murmur, click, rub or gallop Extremities: extremities normal, atraumatic, no cyanosis or  edema Pulses: 2+ and symmetric Skin: Skin color, texture, turgor normal. No rashes or lesions Neurologic: Grossly normal  EKG not performed -- personally reviewed   ASSESSMENT AND PLAN:   1. Chronic Diastolic HF: Euvolemic. Volume stable. Weight stable. She is on chronic home O2 but no increased O2 requirements. Respiratory status stable. BP and HR well controlled w/ BB. Continue current regimen w/ daily lasix. We discussed continuation of low sodium diet and daily weights. She knows to take extra lasix if > 3 lb weight gain in 24 hrs and to call our office if no improvement.   2. Normal Cardiac Cath: 11/2016. Normal coronaries.   3. HTN: controlled on current regimen. 104/68 today. No changes made to regimen today.  4. COPD: on chronic O2. Followed by pulmonology.  5. Obesity: pt acknowledges that she needs to loose weight. We discussed diet and exercise.   6. DM: followed by PCP.    Follow-Up w/ Dr. Radford Pax in 6 months.   Roque Schill Ladoris Gene, MHS [facility] 09/22/2018 8:08 AM

## 2018-09-22 NOTE — Patient Instructions (Signed)
Medication Instructions:  Your physician recommends that you continue on your current medications as directed. Please refer to the Current Medication list given to you today.  If you need a refill on your cardiac medications before your next appointment, please call your pharmacy.   Lab work: NONE If you have labs (blood work) drawn today and your tests are completely normal, you will receive your results only by: Marland Kitchen MyChart Message (if you have MyChart) OR . A paper copy in the mail If you have any lab test that is abnormal or we need to change your treatment, we will call you to review the results.  Testing/Procedures: NONE  Follow-Up: At [facility], you and your health needs are our priority.  As part of our continuing mission to provide you with exceptional heart care, we have created designated Provider Care Teams.  These Care Teams include your primary Cardiologist (physician) and Advanced Practice Providers (APPs -  Physician Assistants and Nurse Practitioners) who all work together to provide you with the care you need, when you need it. You will need a follow up appointment in 6 months.  Please call our office 2 months in advance to schedule this appointment.  You may see Fransico Him, MD or one of the following Advanced Practice Providers on your designated Care Team:   Hollister, PA-C Melina Copa, PA-C . Ermalinda Barrios, PA-C   Any Other Special Instructions Will Be Listed Below (If Applicable)  * You suddenly gain more than 2-3 lb (0.9-1.4 kg) in a day, or more than 5 lb (2.3 kg) in one week, take an extra lasix and call your health care provider.   Low-Sodium Eating Plan Sodium, which is an element that makes up salt, helps you maintain a healthy balance of fluids in your body. Too much sodium can increase your blood pressure and cause fluid and waste to be held in your body. Your health care provider or dietitian may recommend following this plan if you have high  blood pressure (hypertension), kidney disease, liver disease, or heart failure. Eating less sodium can help lower your blood pressure, reduce swelling, and protect your heart, liver, and kidneys. What are tips for following this plan? General guidelines  Most people on this plan should limit their sodium intake to 1,500-2,000 mg (milligrams) of sodium each day. Reading food labels  The Nutrition Facts label lists the amount of sodium in one serving of the food. If you eat more than one serving, you must multiply the listed amount of sodium by the number of servings.  Choose foods with less than 140 mg of sodium per serving.  Avoid foods with 300 mg of sodium or more per serving. Shopping  Look for lower-sodium products, often labeled as "low-sodium" or "no salt added."  Always check the sodium content even if foods are labeled as "unsalted" or "no salt added".  Buy fresh foods. ? Avoid canned foods and premade or frozen meals. ? Avoid canned, cured, or processed meats  Buy breads that have less than 80 mg of sodium per slice. Cooking  Eat more home-cooked food and less restaurant, buffet, and fast food.  Avoid adding salt when cooking. Use salt-free seasonings or herbs instead of table salt or sea salt. Check with your health care provider or pharmacist before using salt substitutes.  Cook with plant-based oils, such as canola, sunflower, or olive oil. Meal planning  When eating at a restaurant, ask that your food be prepared with less salt or  no salt, if possible.  Avoid foods that contain MSG (monosodium glutamate). MSG is sometimes added to [place] food, bouillon, and some canned foods. What foods are recommended? The items listed may not be a complete list. Talk with your dietitian about what dietary choices are best for you. Grains Low-sodium cereals, including oats, puffed wheat and rice, and shredded wheat. Low-sodium crackers. Unsalted rice. Unsalted pasta. Low-sodium  bread. Whole-grain breads and whole-grain pasta. Vegetables Fresh or frozen vegetables. "No salt added" canned vegetables. "No salt added" tomato sauce and paste. Low-sodium or reduced-sodium tomato and vegetable juice. Fruits Fresh, frozen, or canned fruit. Fruit juice. Meats and other protein foods Fresh or frozen (no salt added) meat, poultry, seafood, and fish. Low-sodium canned tuna and salmon. Unsalted nuts. Dried peas, beans, and lentils without added salt. Unsalted canned beans. Eggs. Unsalted nut butters. Dairy Milk. Soy milk. Cheese that is naturally low in sodium, such as ricotta cheese, fresh mozzarella, or Swiss cheese Low-sodium or reduced-sodium cheese. Cream cheese. Yogurt. Fats and oils Unsalted butter. Unsalted margarine with no trans fat. Vegetable oils such as canola or olive oils. Seasonings and other foods Fresh and dried herbs and spices. Salt-free seasonings. Low-sodium mustard and ketchup. Sodium-free salad dressing. Sodium-free light mayonnaise. Fresh or refrigerated horseradish. Lemon juice. Vinegar. Homemade, reduced-sodium, or low-sodium soups. Unsalted popcorn and pretzels. Low-salt or salt-free chips. What foods are not recommended? The items listed may not be a complete list. Talk with your dietitian about what dietary choices are best for you. Grains Instant hot cereals. Bread stuffing, pancake, and biscuit mixes. Croutons. Seasoned rice or pasta mixes. Noodle soup cups. Boxed or frozen macaroni and cheese. Regular salted crackers. Self-rising flour. Vegetables Sauerkraut, pickled vegetables, and relishes. Olives. [place] fries. Onion rings. Regular canned vegetables (not low-sodium or reduced-sodium). Regular canned tomato sauce and paste (not low-sodium or reduced-sodium). Regular tomato and vegetable juice (not low-sodium or reduced-sodium). Frozen vegetables in sauces. Meats and other protein foods Meat or fish that is salted, canned, smoked, spiced, or  pickled. Bacon, ham, sausage, hotdogs, corned beef, chipped beef, packaged lunch meats, salt pork, jerky, pickled herring, anchovies, regular canned tuna, sardines, salted nuts. Dairy Processed cheese and cheese spreads. Cheese curds. Blue cheese. Feta cheese. String cheese. Regular cottage cheese. Buttermilk. Canned milk. Fats and oils Salted butter. Regular margarine. Ghee. Bacon fat. Seasonings and other foods Onion salt, garlic salt, seasoned salt, table salt, and sea salt. Canned and packaged gravies. Worcestershire sauce. Tartar sauce. Barbecue sauce. Teriyaki sauce. Soy sauce, including reduced-sodium. Steak sauce. Fish sauce. Oyster sauce. Cocktail sauce. Horseradish that you find on the shelf. Regular ketchup and mustard. Meat flavorings and tenderizers. Bouillon cubes. Hot sauce and Tabasco sauce. Premade or packaged marinades. Premade or packaged taco seasonings. Relishes. Regular salad dressings. Salsa. Potato and tortilla chips. Corn chips and puffs. Salted popcorn and pretzels. Canned or dried soups. Pizza. Frozen entrees and pot pies. Summary  Eating less sodium can help lower your blood pressure, reduce swelling, and protect your heart, liver, and kidneys.  Most people on this plan should limit their sodium intake to 1,500-2,000 mg (milligrams) of sodium each day.  Canned, boxed, and frozen foods are high in sodium. Restaurant foods, fast foods, and pizza are also very high in sodium. You also get sodium by adding salt to food.  Try to cook at home, eat more fresh fruits and vegetables, and eat less fast food, canned, processed, or prepared foods. This information is not intended to replace advice given to you  by your health care provider. Make sure you discuss any questions you have with your health care provider. Document Released: 04/20/2002 Document Revised: 10/22/2016 Document Reviewed: 10/22/2016 Elsevier Interactive Patient Education  [employer].

## 2018-09-22 NOTE — Telephone Encounter (Signed)
Spoke with pt to verify which metoprolol she was taking. Pt states that she has been taking metoprolol tartrate 50 mg qd but per Ellen Henri PA-C wants her to be on metoprolol succinate 50 mg qd. New rx sent to [facility] for metoprolol succinate 50 qd.

## 2018-09-22 NOTE — Telephone Encounter (Signed)
New Message   Pt c/o medication issue:  1. Name of Medication: metoprolol succinate (TOPROL-XL) 50 MG 24 hr tablet and metoprolol tartrate (LOPRESSOR) 50 MG tablet  2. How are you currently taking this medication (dosage and times per day)? Take 50 mg by mouth daily. Take with or immediately following a meal and Take 1 tablet (50 mg total) by mouth daily.  3. Are you having a reaction (difficulty breathing--STAT)? no  4. What is your medication issue? Pt states she saw both these medications but states she is suppose to be taking the Toprol xl and not the lopressor. Please call

## 2018-09-23 ENCOUNTER — Other Ambulatory Visit: Payer: Self-pay | Admitting: Cardiology

## 2018-09-23 MED ORDER — METOPROLOL SUCCINATE ER 50 MG PO TB24: 50.0000 mg | ORAL_TABLET | Freq: Every day | ORAL | 3 refills | Status: DC

## 2018-10-14 ENCOUNTER — Encounter: Payer: Self-pay | Admitting: Emergency Medicine

## 2018-10-14 ENCOUNTER — Ambulatory Visit: Payer: Medicaid Other | Admitting: Emergency Medicine

## 2018-10-14 DIAGNOSIS — J449 Chronic obstructive pulmonary disease, unspecified: Secondary | ICD-10-CM | POA: Diagnosis not present

## 2018-10-14 DIAGNOSIS — J309 Allergic rhinitis, unspecified: Secondary | ICD-10-CM

## 2018-10-14 DIAGNOSIS — J9611 Chronic respiratory failure with hypoxia: Secondary | ICD-10-CM | POA: Diagnosis not present

## 2018-10-14 MED ORDER — PREDNISONE 10 MG PO TABS: ORAL_TABLET | ORAL | 0 refills | Status: DC

## 2018-10-14 MED ORDER — AZITHROMYCIN 250 MG PO TABS: ORAL_TABLET | ORAL | 0 refills | Status: DC

## 2018-10-14 NOTE — Assessment & Plan Note (Signed)
Continue current oxygen 2-3 L/min at all times

## 2018-10-14 NOTE — Assessment & Plan Note (Signed)
With a mild exacerbation.  Treat with azithromycin and prednisone taper, continue her maintenance medications as ordered.  She will call if failing to improve

## 2018-10-14 NOTE — Patient Instructions (Signed)
Please continue Trelegy once daily as you have been using it.  Remember to rinse and gargle after using. Keep albuterol available to use 2 puffs up to every 4 hours if needed for shortness of breath, chest tightness, wheezing.  Take prednisone as directed until completely gone. Take azithromycin as directed until completely gone. Wear your oxygen at 2 to 3 L/min at all times. Please continue loratadine and fluticasone nasal spray as you have been using them. Follow with Dr Lamonte Sakai in 4 months or sooner if you have any problems

## 2018-10-14 NOTE — Assessment & Plan Note (Signed)
Continue fluticasone nasal spray, loratadine 

## 2018-10-14 NOTE — Progress Notes (Signed)
  Subjective:    Patient ID: Stephanie Schultz, female    DOB: [birth year], 62 y.o.   MRN: [id number]    ROV 07/29/18 --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  She is been treated in the past with for sleep apnea but her most recent PSG showed only nocturnal hypoxemia without overt obstructive apneas.  At her last visit I changed her Symbicort to Trelegy to see if she would get benefit.  She was unfortunately seen in the emergency department 9/6 with more nasal congestion, sore throat, thick sputum and increased albuterol use.  She was treated with 5 days of prednisone, doxycycline. She is feeling better now. She believes that the Trelegy has been beneficial, her breathing and exertional tolerance is better. She is using 2-3L/min o2. She uses albuterol about 4-5x a day, appears to be using on a schedule.   Acute 10/14/18 --[name] is 8, obese with a history of COPD with an asthmatic component and significant upper airway irritation syndrome associated with chronic cough.  She has hypertension with diastolic dysfunction.  She was formally treated for sleep apnea but her most recent PSG shows only nocturnal hypoxemia without any overt obstruction.  She is now managed on Trelegy.  She presents today for an acute visit complaining of 3 weeks of initially URI sx, then evolved some slight cough with thick yellow mucous. Progressed to exertional SOB. She tried temporarily increasing her diuretics rto see if it would help - no better. She is having SOB when walking through the house. She is using albuterol more frequently.  She remains loratadine, flonase.   ROS  See history of present illness   Objective:   Physical Exam Vitals:   10/14/18 1346  BP: 122/66  Pulse: 70  SpO2: 97%  Weight: 223 lb (101.2 kg)  Height: 5' (1.524 m)    Gen: Obese, in no distress,  normal  affect  ENT: No lesions,  mouth clear,  oropharynx clear, no postnasal drip, strong voice  Neck: No JVD, no stridor  Lungs: No use of accessory muscles, distant, soft end-exp wheeze B   Cardiovascular: RRR, heart sounds normal, no murmur or gallops, no peripheral edema  Musculoskeletal: No deformities, no cyanosis or clubbing  Neuro: alert, non focal  Skin: Warm, no lesions or rashes, old scars on B UE's   Assessment & Plan:   COPD (chronic obstructive pulmonary disease) ([place]) With a mild exacerbation.  Treat with azithromycin and prednisone taper, continue her maintenance medications as ordered.  She will call if failing to improve  Chronic allergic rhinitis Continue fluticasone nasal spray, loratadine  Chronic respiratory failure ([facility]) Continue current oxygen 2-3 L/min at all times  [name], MD, PhD 10/14/2018, 2:05 PM [facility] [phone] or if no answer [phone]

## 2018-10-14 NOTE — Addendum Note (Signed)
Addended by: Len Blalock on: 10/14/2018 02:09 PM   Modules accepted: Orders

## 2018-11-17 ENCOUNTER — Emergency Department (HOSPITAL_COMMUNITY): Payer: Medicaid Other

## 2018-11-17 ENCOUNTER — Inpatient Hospital Stay (HOSPITAL_COMMUNITY)
Admission: EM | Admit: 2018-11-17 | Discharge: 2018-11-19 | DRG: 190 | Disposition: A | Discharge status: Home Health | Payer: Medicaid Other | Attending: Internal Medicine | Admitting: Internal Medicine

## 2018-11-17 ENCOUNTER — Encounter (HOSPITAL_COMMUNITY): Payer: Self-pay | Admitting: Emergency Medicine

## 2018-11-17 DIAGNOSIS — J9621 Acute and chronic respiratory failure with hypoxia: Secondary | ICD-10-CM | POA: Diagnosis not present

## 2018-11-17 DIAGNOSIS — K219 Gastro-esophageal reflux disease without esophagitis: Secondary | ICD-10-CM | POA: Diagnosis present

## 2018-11-17 DIAGNOSIS — Z833 Family history of diabetes mellitus: Secondary | ICD-10-CM

## 2018-11-17 DIAGNOSIS — Z7982 Long term (current) use of aspirin: Secondary | ICD-10-CM

## 2018-11-17 DIAGNOSIS — I5189 Other ill-defined heart diseases: Secondary | ICD-10-CM | POA: Diagnosis not present

## 2018-11-17 DIAGNOSIS — I110 Hypertensive heart disease with heart failure: Secondary | ICD-10-CM | POA: Diagnosis present

## 2018-11-17 DIAGNOSIS — R0602 Shortness of breath: Secondary | ICD-10-CM

## 2018-11-17 DIAGNOSIS — Z87891 Personal history of nicotine dependence: Secondary | ICD-10-CM

## 2018-11-17 DIAGNOSIS — J9611 Chronic respiratory failure with hypoxia: Secondary | ICD-10-CM | POA: Diagnosis present

## 2018-11-17 DIAGNOSIS — E785 Hyperlipidemia, unspecified: Secondary | ICD-10-CM | POA: Diagnosis present

## 2018-11-17 DIAGNOSIS — I5032 Chronic diastolic (congestive) heart failure: Secondary | ICD-10-CM | POA: Diagnosis present

## 2018-11-17 DIAGNOSIS — Z6841 Body Mass Index (BMI) 40.0 and over, adult: Secondary | ICD-10-CM

## 2018-11-17 DIAGNOSIS — Z801 Family history of malignant neoplasm of trachea, bronchus and lung: Secondary | ICD-10-CM

## 2018-11-17 DIAGNOSIS — Z9049 Acquired absence of other specified parts of digestive tract: Secondary | ICD-10-CM

## 2018-11-17 DIAGNOSIS — Z9851 Tubal ligation status: Secondary | ICD-10-CM

## 2018-11-17 DIAGNOSIS — G4733 Obstructive sleep apnea (adult) (pediatric): Secondary | ICD-10-CM | POA: Diagnosis present

## 2018-11-17 DIAGNOSIS — E1165 Type 2 diabetes mellitus with hyperglycemia: Secondary | ICD-10-CM | POA: Diagnosis present

## 2018-11-17 DIAGNOSIS — R0902 Hypoxemia: Secondary | ICD-10-CM

## 2018-11-17 DIAGNOSIS — Z7951 Long term (current) use of inhaled steroids: Secondary | ICD-10-CM

## 2018-11-17 DIAGNOSIS — M1990 Unspecified osteoarthritis, unspecified site: Secondary | ICD-10-CM | POA: Diagnosis present

## 2018-11-17 DIAGNOSIS — Z8541 Personal history of malignant neoplasm of cervix uteri: Secondary | ICD-10-CM

## 2018-11-17 DIAGNOSIS — Z66 Do not resuscitate: Secondary | ICD-10-CM | POA: Diagnosis present

## 2018-11-17 DIAGNOSIS — D573 Sickle-cell trait: Secondary | ICD-10-CM | POA: Diagnosis present

## 2018-11-17 DIAGNOSIS — Z8701 Personal history of pneumonia (recurrent): Secondary | ICD-10-CM

## 2018-11-17 DIAGNOSIS — E1140 Type 2 diabetes mellitus with diabetic neuropathy, unspecified: Secondary | ICD-10-CM | POA: Diagnosis present

## 2018-11-17 DIAGNOSIS — Z79899 Other long term (current) drug therapy: Secondary | ICD-10-CM

## 2018-11-17 DIAGNOSIS — J441 Chronic obstructive pulmonary disease with (acute) exacerbation: Secondary | ICD-10-CM | POA: Diagnosis present

## 2018-11-17 DIAGNOSIS — Z888 Allergy status to other drugs, medicaments and biological substances status: Secondary | ICD-10-CM

## 2018-11-17 DIAGNOSIS — M109 Gout, unspecified: Secondary | ICD-10-CM | POA: Diagnosis present

## 2018-11-17 DIAGNOSIS — Z9981 Dependence on supplemental oxygen: Secondary | ICD-10-CM

## 2018-11-17 DIAGNOSIS — Z794 Long term (current) use of insulin: Secondary | ICD-10-CM

## 2018-11-17 HISTORY — DX: Hypoxemia: R09.02

## 2018-11-17 LAB — CBC WITH DIFFERENTIAL/PLATELET
Abs Immature Granulocytes: 0.05 10*3/uL (ref 0.00–0.07)
BASOS ABS: 0 10*3/uL (ref 0.0–0.1)
Basophils Relative: 1 %
EOS PCT: 2 %
Eosinophils Absolute: 0.1 10*3/uL (ref 0.0–0.5)
HEMATOCRIT: 37.3 % (ref 36.0–46.0)
HEMOGLOBIN: 12.2 g/dL (ref 12.0–15.0)
Immature Granulocytes: 1 %
LYMPHS ABS: 1.6 10*3/uL (ref 0.7–4.0)
LYMPHS PCT: 21 %
MCH: 27.7 pg (ref 26.0–34.0)
MCHC: 32.7 g/dL (ref 30.0–36.0)
MCV: 84.6 fL (ref 80.0–100.0)
MONO ABS: 0.5 10*3/uL (ref 0.1–1.0)
Monocytes Relative: 6 %
Neutro Abs: 5.5 10*3/uL (ref 1.7–7.7)
Neutrophils Relative %: 69 %
Platelets: 241 10*3/uL (ref 150–400)
RBC: 4.41 MIL/uL (ref 3.87–5.11)
RDW: 14.5 % (ref 11.5–15.5)
WBC: 7.8 10*3/uL (ref 4.0–10.5)
nRBC: 0 % (ref 0.0–0.2)

## 2018-11-17 LAB — COMPREHENSIVE METABOLIC PANEL
ALBUMIN: 3.8 g/dL (ref 3.5–5.0)
ALK PHOS: 37 U/L — AB (ref 38–126)
ALT: 38 U/L (ref 0–44)
ANION GAP: 11 (ref 5–15)
AST: 26 U/L (ref 15–41)
BILIRUBIN TOTAL: 0.5 mg/dL (ref 0.3–1.2)
BUN: 10 mg/dL (ref 6–20)
CALCIUM: 9.6 mg/dL (ref 8.9–10.3)
CHLORIDE: 101 mmol/L (ref 98–111)
CO2: 27 mmol/L (ref 22–32)
Creatinine, Ser: 1.02 mg/dL — ABNORMAL HIGH (ref 0.44–1.00)
GFR calc Af Amer: >60 mL/min (ref >60)
GFR calc non Af Amer: 60 mL/min — ABNORMAL LOW (ref >60)
GLUCOSE: 216 mg/dL — AB (ref 70–99)
POTASSIUM: 4 mmol/L (ref 3.5–5.1)
Sodium: 139 mmol/L (ref 135–145)
Total Protein: 7.6 g/dL (ref 6.5–8.1)

## 2018-11-17 LAB — CBG MONITORING, ED: Glucose-Capillary: 309 mg/dL — ABNORMAL HIGH (ref 70–99)

## 2018-11-17 LAB — GLUCOSE, CAPILLARY: Glucose-Capillary: 278 mg/dL — ABNORMAL HIGH (ref 70–99)

## 2018-11-17 MED ORDER — INSULIN ASPART 100 UNIT/ML ~~LOC~~ SOLN
0.0000 [IU] | Freq: Every day | SUBCUTANEOUS | Status: DC
  Administered 2018-11-17: 3 [IU] via SUBCUTANEOUS
  Administered 2018-11-18: 4 [IU] via SUBCUTANEOUS

## 2018-11-17 MED ORDER — SPIRONOLACTONE 12.5 MG HALF TABLET
12.5000 mg | ORAL_TABLET | Freq: Every day | ORAL | Status: DC
  Administered 2018-11-18 – 2018-11-19 (×2): 12.5 mg via ORAL
  Filled 2018-11-17 (×2): qty 1

## 2018-11-17 MED ORDER — FUROSEMIDE 20 MG PO TABS: 20.0000 mg | ORAL_TABLET | ORAL | Status: DC

## 2018-11-17 MED ORDER — FLUTICASONE FUROATE-VILANTEROL 100-25 MCG/INH IN AEPB
1.0000 | INHALATION_SPRAY | Freq: Every day | RESPIRATORY_TRACT | Status: DC
  Filled 2018-11-17 (×2): qty 28

## 2018-11-17 MED ORDER — METOPROLOL SUCCINATE ER 50 MG PO TB24
50.0000 mg | ORAL_TABLET | Freq: Every day | ORAL | Status: DC
  Administered 2018-11-18 – 2018-11-19 (×2): 50 mg via ORAL
  Filled 2018-11-17 (×2): qty 1

## 2018-11-17 MED ORDER — INSULIN ASPART 100 UNIT/ML ~~LOC~~ SOLN
0.0000 [IU] | Freq: Three times a day (TID) | SUBCUTANEOUS | Status: DC
  Administered 2018-11-17 – 2018-11-18 (×2): 15 [IU] via SUBCUTANEOUS
  Administered 2018-11-18: 11 [IU] via SUBCUTANEOUS
  Administered 2018-11-18: 15 [IU] via SUBCUTANEOUS
  Administered 2018-11-19: 20 [IU] via SUBCUTANEOUS

## 2018-11-17 MED ORDER — ZOLPIDEM TARTRATE 5 MG PO TABS
5.0000 mg | ORAL_TABLET | Freq: Every day | ORAL | Status: DC
  Administered 2018-11-17 – 2018-11-18 (×2): 5 mg via ORAL
  Filled 2018-11-17 (×2): qty 1

## 2018-11-17 MED ORDER — INSULIN GLARGINE 100 UNIT/ML ~~LOC~~ SOLN
18.0000 [IU] | Freq: Every day | SUBCUTANEOUS | Status: DC
  Administered 2018-11-17: 18 [IU] via SUBCUTANEOUS
  Filled 2018-11-17 (×2): qty 0.18

## 2018-11-17 MED ORDER — HYDROCODONE-ACETAMINOPHEN 7.5-325 MG PO TABS: 1.0000 | ORAL_TABLET | Freq: Four times a day (QID) | ORAL | Status: DC | PRN

## 2018-11-17 MED ORDER — MAGNESIUM SULFATE 2 GM/50ML IV SOLN
2.0000 g | Freq: Once | INTRAVENOUS | Status: AC
  Administered 2018-11-17: 2 g via INTRAVENOUS
  Filled 2018-11-17: qty 50

## 2018-11-17 MED ORDER — ALBUTEROL SULFATE (2.5 MG/3ML) 0.083% IN NEBU
2.5000 mg | INHALATION_SOLUTION | Freq: Once | RESPIRATORY_TRACT | Status: AC
  Administered 2018-11-17: 2.5 mg via RESPIRATORY_TRACT
  Filled 2018-11-17: qty 3

## 2018-11-17 MED ORDER — PREDNISONE 20 MG PO TABS
40.0000 mg | ORAL_TABLET | Freq: Every day | ORAL | Status: DC
  Filled 2018-11-17: qty 2

## 2018-11-17 MED ORDER — LINACLOTIDE 145 MCG PO CAPS
145.0000 ug | ORAL_CAPSULE | Freq: Every day | ORAL | Status: DC
  Administered 2018-11-18 – 2018-11-19 (×2): 145 ug via ORAL
  Filled 2018-11-17 (×2): qty 1

## 2018-11-17 MED ORDER — ENOXAPARIN SODIUM 40 MG/0.4ML ~~LOC~~ SOLN
40.0000 mg | SUBCUTANEOUS | Status: DC
  Administered 2018-11-17 – 2018-11-18 (×2): 40 mg via SUBCUTANEOUS
  Filled 2018-11-17 (×3): qty 0.4

## 2018-11-17 MED ORDER — IPRATROPIUM-ALBUTEROL 0.5-2.5 (3) MG/3ML IN SOLN
3.0000 mL | Freq: Once | RESPIRATORY_TRACT | Status: AC
  Administered 2018-11-17: 3 mL via RESPIRATORY_TRACT
  Filled 2018-11-17: qty 3

## 2018-11-17 MED ORDER — INSULIN GLARGINE 100 UNITS/ML SOLOSTAR PEN
18.0000 [IU] | PEN_INJECTOR | Freq: Every day | SUBCUTANEOUS | Status: DC
  Filled 2018-11-17: qty 3

## 2018-11-17 MED ORDER — ALPRAZOLAM 0.5 MG PO TABS: 1.0000 mg | ORAL_TABLET | Freq: Every evening | ORAL | Status: DC | PRN

## 2018-11-17 MED ORDER — LORATADINE 10 MG PO TABS
10.0000 mg | ORAL_TABLET | Freq: Every day | ORAL | Status: DC
  Administered 2018-11-18 – 2018-11-19 (×2): 10 mg via ORAL
  Filled 2018-11-17 (×2): qty 1

## 2018-11-17 MED ORDER — FLUTICASONE PROPIONATE 50 MCG/ACT NA SUSP
1.0000 | Freq: Every day | NASAL | Status: DC | PRN
  Filled 2018-11-17: qty 16

## 2018-11-17 MED ORDER — FLUTICASONE-UMECLIDIN-VILANT 100-62.5-25 MCG/INH IN AEPB: 1.0000 | INHALATION_SPRAY | Freq: Every day | RESPIRATORY_TRACT | Status: DC

## 2018-11-17 MED ORDER — ALBUTEROL SULFATE (2.5 MG/3ML) 0.083% IN NEBU: 2.5000 mg | INHALATION_SOLUTION | RESPIRATORY_TRACT | Status: DC | PRN

## 2018-11-17 MED ORDER — FUROSEMIDE 40 MG PO TABS
40.0000 mg | ORAL_TABLET | Freq: Every morning | ORAL | Status: DC
  Administered 2018-11-18 – 2018-11-19 (×2): 40 mg via ORAL
  Filled 2018-11-17 (×2): qty 1

## 2018-11-17 MED ORDER — ALLOPURINOL 100 MG PO TABS
100.0000 mg | ORAL_TABLET | Freq: Every day | ORAL | Status: DC
  Administered 2018-11-18 – 2018-11-19 (×2): 100 mg via ORAL
  Filled 2018-11-17 (×2): qty 1

## 2018-11-17 MED ORDER — METHYLPREDNISOLONE SODIUM SUCC 125 MG IJ SOLR
60.0000 mg | Freq: Two times a day (BID) | INTRAMUSCULAR | Status: AC
  Administered 2018-11-17 – 2018-11-18 (×2): 60 mg via INTRAVENOUS
  Filled 2018-11-17 (×2): qty 2

## 2018-11-17 MED ORDER — FUROSEMIDE 20 MG PO TABS
20.0000 mg | ORAL_TABLET | Freq: Every evening | ORAL | Status: DC
  Administered 2018-11-17 – 2018-11-18 (×2): 20 mg via ORAL
  Filled 2018-11-17 (×2): qty 1

## 2018-11-17 MED ORDER — UMECLIDINIUM BROMIDE 62.5 MCG/INH IN AEPB
1.0000 | INHALATION_SPRAY | Freq: Every day | RESPIRATORY_TRACT | Status: DC
  Filled 2018-11-17 (×2): qty 7

## 2018-11-17 MED ORDER — IPRATROPIUM-ALBUTEROL 0.5-2.5 (3) MG/3ML IN SOLN
3.0000 mL | Freq: Four times a day (QID) | RESPIRATORY_TRACT | Status: DC
  Administered 2018-11-17: 3 mL via RESPIRATORY_TRACT
  Filled 2018-11-17: qty 3

## 2018-11-17 MED ORDER — FUROSEMIDE 10 MG/ML IJ SOLN
20.0000 mg | Freq: Once | INTRAMUSCULAR | Status: AC
  Administered 2018-11-17: 20 mg via INTRAVENOUS
  Filled 2018-11-17: qty 2

## 2018-11-17 MED ORDER — IPRATROPIUM-ALBUTEROL 0.5-2.5 (3) MG/3ML IN SOLN
3.0000 mL | Freq: Three times a day (TID) | RESPIRATORY_TRACT | Status: DC
  Administered 2018-11-18 – 2018-11-19 (×4): 3 mL via RESPIRATORY_TRACT
  Filled 2018-11-17 (×4): qty 3

## 2018-11-17 MED ORDER — ASPIRIN EC 81 MG PO TBEC
81.0000 mg | DELAYED_RELEASE_TABLET | Freq: Every day | ORAL | Status: DC
  Administered 2018-11-18 – 2018-11-19 (×2): 81 mg via ORAL
  Filled 2018-11-17 (×2): qty 1

## 2018-11-17 MED ORDER — PREDNISONE 10 MG PO TABS: 40.0000 mg | ORAL_TABLET | Freq: Every day | ORAL | 0 refills | Status: DC

## 2018-11-17 MED ORDER — FAMOTIDINE 20 MG PO TABS
20.0000 mg | ORAL_TABLET | Freq: Two times a day (BID) | ORAL | Status: DC
  Administered 2018-11-17 – 2018-11-19 (×4): 20 mg via ORAL
  Filled 2018-11-17 (×4): qty 1

## 2018-11-17 NOTE — H&P (Signed)
History and Physical    Stephanie Schultz SWF:[id number] DOB: [birth year] DOA: 11/17/2018  PCP: [name], MD Consultants:  Lamonte Sakai - pulmonary; Daylene Posey - cardiology Patient coming from:  Home - lives alone; NOK: Daughter, [phone]  Chief Complaint: SOB  HPI: Stephanie Schultz is a 63 y.o. female with medical history significant of OSA on CPAP; morbid obesity (BMI 26); HTN; HLD; COPD on 2L home O2; and chronic diastolic CHF presenting with COPD exacerbation with new O2 demand.  She went to school at [employer] today and was very SOB.  This was worse with exertion.  "When my bowels get loose with the breathing problem, I know it's time for me to go to the emergency room."  She had difficulty breathing for about 3 weeks and saw [name] on 12/3; she was started on prednisone then.  About 4 weeks later, she got prednisone by Dr. Jonelle Sidle.  She is compliant with therapy.  Slight cough.  She wears 2.5L at home, but 3L with walking.  No fever but she "got cold" about a week ago.   ED Course: SOB.  Given breathing treatments and steroids with EMS.  Increased O2 from 2.5-3 to 4L and mid-90s on exam.  Takes Lasix for CHF.  Labs normal.  CXR negative.  Given Mag and breathing treatment.  Sats dropped to 83% while walking on home O2.   Review of Systems: As per HPI; otherwise review of systems reviewed and negative.   Ambulatory Status:  Ambulates without assistance  Past Medical History:  Diagnosis Date  . Anemia    chronis  . Anxiety   . Arthritis   . Cervical cancer ([place]) 1990   cervical   . Chronic diastolic CHF (congestive heart failure) ([place]) 08/10/2016  . COPD (chronic obstructive pulmonary disease) ([place])    O2 dependent. Pulmo: [name]  . Diabetes mellitus without complication ([place])    a. A1c 8.3 in 11/2015  . Emphysema   . Gallstones    s/p cholecystectomy  . GERD (gastroesophageal reflux disease)   . Gout   . History of home oxygen therapy    "2.5L; 24/7"  (08/10/2016)  . Hx of cardiac catheterization    a. LHC at [facility] in [place], [place] 09/2008:  Normal coronary arteries EF 70%. b. LHC 11/2016: normal cors, normal LVEDP, EF 55-65%.  . Hyperlipidemia   . Hypertension   . Morbid obesity ([facility])   . OSA on CPAP    CPAP at night   . Pneumonia   . Sickle cell trait ([place])   . Tobacco abuse    a. up to 3ppd from age 4 to 42, now 1/4 ppd (01/2013) >> Quit 10/2015    Past Surgical History:  Procedure Laterality Date  . BREAST BIOPSY    . CARDIAC CATHETERIZATION    . CARDIAC CATHETERIZATION N/A 12/03/2016   Procedure: Left Heart Cath and Coronary Angiography;  Surgeon: Peter M Martinique, MD;  Location: [facility];  Service: Cardiovascular;  Laterality: N/A;  . CHOLECYSTECTOMY N/A 12/13/2015   Procedure: LAPAROSCOPIC CHOLECYSTECTOMY;  Surgeon: Ralene Ok, MD;  Location: [facility];  Service: General;  Laterality: N/A;  . COLONOSCOPY  09/05/2012   Procedure: COLONOSCOPY;  Surgeon: Beryle Beams, MD;  Location: [facility];  Service: Endoscopy;  Laterality: N/A;  . COLONOSCOPY WITH PROPOFOL N/A 09/02/2015   Procedure: COLONOSCOPY WITH PROPOFOL;  Surgeon: Carol Ada, MD;  Location: [facility];  Service: Endoscopy;  Laterality: N/A;  .  ESOPHAGOGASTRODUODENOSCOPY (EGD) WITH PROPOFOL N/A 03/29/2017   Procedure: ESOPHAGOGASTRODUODENOSCOPY (EGD) WITH PROPOFOL;  Surgeon: Carol Ada, MD;  Location: [facility];  Service: Endoscopy;  Laterality: N/A;  . TUBAL LIGATION      Social History   Socioeconomic History  . Marital status: Divorced    Spouse name: Not on file  . Number of children: 3  . Years of education: 11th  . Highest education level: Not on file  Occupational History  . Occupation: Disabled    Comment: Emphysema  Social Needs  . Financial resource strain: Not on file  . Food insecurity:    Worry: Not on file    Inability: Not on file  . Transportation needs:    Medical: Not on file    Non-medical: Not on file  Tobacco Use   . Smoking status: Former Smoker    Packs/day: 0.50    Years: 36.00    Pack years: 18.00    Types: Cigarettes    Last attempt to quit: 11/11/2015    Years since quitting: 3.0  . Smokeless tobacco: Never Used  . Tobacco comment: Approx 90 pk-yrs (up to 3ppd until ~ 2009). Smoking 3 cigs per day now.  Substance and Sexual Activity  . Alcohol use: No    Alcohol/week: 0.0 standard drinks  . Drug use: No  . Sexual activity: Yes  Lifestyle  . Physical activity:    Days per week: Not on file    Minutes per session: Not on file  . Stress: Not on file  Relationships  . Social connections:    Talks on phone: Not on file    Gets together: Not on file    Attends religious service: Not on file    Active member of club or organization: Not on file    Attends meetings of clubs or organizations: Not on file    Relationship status: Not on file  . Intimate partner violence:    Fear of current or ex partner: Not on file    Emotionally abused: Not on file    Physically abused: Not on file    Forced sexual activity: Not on file  Other Topics Concern  . Not on file  Social History Narrative   From [place], [place].  Moved to [place] about 8 years ago to be closer to her daughter. She moved back to [place] about a year and a half ago. She lives by herself. She does not routinely exercise or adhere to any particular diet.   Caffeine Use: 1-2 cups daily   Disabled; Previously in home care    Allergies  Allergen Reactions  . Acetaminophen Other (See Comments)    Pt was told that she could not take Tylenol, does not know why.    Family History  Problem Relation Age of Onset  . Other Father        unaware of father's medical history  . Diabetes Mother        alive @ 8  . Myasthenia gravis Mother   . Lung cancer Paternal Aunt   . Lung cancer Paternal Grandfather   . Other Other        multiple siblings a&w.  . Heart attack Neg Hx   . Heart failure Neg Hx   . Breast cancer Neg Hx       Prior to Admission medications   Medication Sig Start Date End Date Taking? Authorizing Provider  albuterol (PROVENTIL) (2.5 MG/3ML) 0.083% nebulizer solution Take 3 mLs (2.5  mg total) by nebulization 4 (four) times daily as needed for wheezing or shortness of breath. 12/16/17  Yes Young, Tarri Fuller D, MD  allopurinol (ZYLOPRIM) 100 MG tablet Take 100 mg by mouth daily.   Yes [provider]  ALPRAZolam Duanne Moron) 1 MG tablet Take 1 mg by mouth at bedtime as needed for sleep.  04/13/16  Yes [provider]  aspirin EC 81 MG tablet Take 81 mg by mouth daily.   Yes [provider]  Cholecalciferol (VITAMIN D PO) Take 1 tablet by mouth daily.   Yes [provider]  clobetasol cream (TEMOVATE) 6.96 % Apply 1 application topically daily as needed (irritation from pull ups).  03/25/17  Yes [provider]  fluticasone (FLONASE) 50 MCG/ACT nasal spray Place 1 spray into both nostrils daily as needed (congestion). 08/26/17  Yes Aline August, MD  Fluticasone-Umeclidin-Vilant (TRELEGY ELLIPTA) 100-62.5-25 MCG/INH AEPB Inhale 1 puff into the lungs daily. 09/18/18  Yes Collene Gobble, MD  furosemide (LASIX) 40 MG tablet Take 1 tablet in the morning and 0.5 tablet in the evening Patient taking differently: Take 20-40 mg by mouth See admin instructions. Take 1 tablet in the morning and 0.5 tablet in the evening 04/23/18  Yes Turner, Traci R, MD  glipiZIDE (GLUCOTROL) 10 MG tablet Take 10 mg by mouth 2 (two) times daily before a meal.    Yes [provider]  HYDROcodone-acetaminophen (NORCO) 7.5-325 MG tablet Take 1 tablet by mouth every 6 (six) hours as needed for moderate pain.   Yes [provider]  insulin glargine (LANTUS) 100 unit/mL SOPN Inject 18 Units into the skin at bedtime.    Yes [provider]  Linaclotide (LINZESS) 145 MCG CAPS capsule Take 145 mcg by mouth daily.    Yes [provider]  loratadine (CLARITIN) 10 MG tablet  Take 10 mg by mouth daily.   Yes [provider]  metoprolol succinate (TOPROL-XL) 50 MG 24 hr tablet Take 1 tablet (50 mg total) by mouth daily. Take with or immediately following a meal. 09/23/18  Yes Rosita Fire, Brittainy M, PA-C  ondansetron (ZOFRAN ODT) 4 MG disintegrating tablet Take 1 tablet (4 mg total) by mouth every 8 (eight) hours as needed for nausea or vomiting. 03/01/17  Yes Blanchie Dessert, MD  OXYGEN Inhale 2.5 L into the lungs continuous.    Yes [provider]  Polyethyl Glycol-Propyl Glycol (SYSTANE ULTRA OP) Place 2 drops into both eyes 2 (two) times daily.   Yes [provider]  Potassium Chloride ER 20 MEQ TBCR Take 20 mEq by mouth daily. 12/04/16  Yes Arbutus Leas, NP  PROAIR HFA 108 (90 Base) MCG/ACT inhaler INHALE 2 PUFFS BY MOUTH EVERY 4 HOURS AS NEEDED FOR WHEEZING Patient taking differently: Inhale 2 puffs into the lungs every 4 (four) hours as needed for wheezing or shortness of breath.  06/14/16  Yes Collene Gobble, MD  ranitidine (ZANTAC 75) 75 MG tablet Take 1 tablet (75 mg total) 2 (two) times daily by mouth. 09/18/17  Yes Simmons, Brittainy M, PA-C  sitaGLIPtin (JANUVIA) 100 MG tablet Take 100 mg by mouth daily.   Yes [provider]  spironolactone (ALDACTONE) 25 MG tablet Take 0.5 tablets (12.5 mg total) by mouth daily. 12/05/16  Yes Arbutus Leas, NP  zolpidem (AMBIEN) 10 MG tablet Take 10 mg by mouth at bedtime.   Yes [provider]  guaiFENesin (MUCINEX) 600 MG 12 hr tablet Take 1 tablet (600 mg total) by  mouth 2 (two) times daily. Patient not taking: Reported on 11/17/2018 08/26/17   Aline August, MD  pantoprazole (PROTONIX) 40 MG tablet Take 1 tablet (40 mg total) by mouth daily. Patient not taking: Reported on 11/17/2018 08/26/17   Aline August, MD    Physical Exam: Vitals:   11/17/18 1600 11/17/18 1609 11/17/18 1823 11/17/18 1854  BP:   (!) 150/67 (!) 110/52  Pulse: 99 98 (!) 106 (!) 102  Resp: (!) 21 17 20 20     Temp:   97.6 F (36.4 C) 98 F (36.7 C)  TempSrc:   Oral Oral  SpO2: 92% 91% 96% 94%  Weight:      Height:         General:  Appears calm and comfortable and is NAD Eyes:  PERRL, EOMI, normal lids, iris ENT:  grossly normal hearing, lips & tongue, mmm Neck:  no LAD, masses or thyromegaly; mild TTP of B neck Cardiovascular:  RRR, no m/r/g. No LE edema.  Respiratory:   Moderately good air movement, scattered wheezes.  Normal respiratory effort. Abdomen:  soft, NT, ND, NABS, obese Skin:  no rash or induration seen on limited exam Musculoskeletal:  grossly normal tone BUE/BLE, good ROM, no bony abnormality Psychiatric:  grossly normal mood and affect, speech fluent and appropriate, AOx3 Neurologic:  CN 2-12 grossly intact, moves all extremities in coordinated fashion, sensation intact    Radiological Exams on Admission: Dg Chest 2 View  Result Date: 11/17/2018 CLINICAL DATA:  Shortness of breath EXAM: CHEST - 2 VIEW COMPARISON:  07/18/2018 FINDINGS: Normal heart size and stable mediastinal contours. Chronic interstitial coarsening. There is no edema, consolidation, effusion, or pneumothorax. Remote and healed left rib fractures. Artifact from EKG leads. IMPRESSION: Stable from prior.  No evidence of acute disease. Electronically Signed   By: Monte Fantasia M.D.   On: 11/17/2018 11:29    EKG: Independently reviewed.  NSR with rate 88; nonspecific ST changes with no evidence of acute ischemia; NSCSLT   Labs on Admission: I have personally reviewed the available labs and imaging studies at the time of the admission.  Pertinent labs:   Glucose 216 BUN 10/Creatinine 1.02/GFR >60 Normal CBC   Assessment/Plan Active Problems:   COPD (chronic obstructive pulmonary disease) (HCC)   Diabetes mellitus type 2, controlled (HCC)   Diastolic dysfunction   Morbid obesity ([facility]CC)   Acute on chronic respiratory failure with hypoxia (HCC)   Acute on chronic respiratory failure  associated with a COPD exacerbation -Patient's shortness of breath is most likely caused by acute COPD exacerbation, possibly in conjunction with OHS.  -She has history of O2-dependent COPD but had to increase her O2 a bit due to current SOB -She does not have fever or leukocytosis.  -Chest x-ray is not consistent with pneumonia -She was given a continuous neb treatment in the ED with some improvement.   -will observe patient; she is significantly improved already and may be appropriate for d/c to home tomorrow -Nebulizers: scheduled Duoneb and prn albuterol -Continue Breo Ellipta and Incruse Ellipta -Solu-Medrol 60 mg IV BID -> prednisone -No antibiotics for now - no cough, low suspicion for bacterial infection  DM -Will check A1c -hold Glucotrol and Januvia -Continue Lantus -Cover with resistant-scale SSI  Diastolic dysfunction -Normal systolic and diastolic function in 03/55 -Low suspicion for CHF as the cause now  Morbid obesity -BMI is 43 -Suspect OHS is contributing to symptoms -Encourage weight loss, nutrition consult   DVT prophylaxis: Lovenox  Code Status:  DNR - confirmed with patient Family Communication: None present Disposition Plan:  Home once clinically improved Consults called: CM/PT/OT/Nutrition/RT  Admission status: It is my clinical opinion that referral for OBSERVATION is reasonable and necessary in this patient based on the above information provided. The aforementioned taken together are felt to place the patient at high risk for further clinical deterioration. However it is anticipated that the patient may be medically stable for discharge from the hospital within 24 to 48 hours.    Karmen Bongo MD [facility]  If note is complete, please contact covering daytime or nighttime physician. [email or web address] Password [email or web address]  11/17/2018, 7:02 PM

## 2018-11-17 NOTE — ED Notes (Signed)
Pt ambulated in hall with O2 on at 3L/m/[place] -- O2 sats dropped to 83%, came back up to 90% once in room back on 3L/m/[place]

## 2018-11-17 NOTE — ED Notes (Signed)
Patient transported to X-ray 

## 2018-11-17 NOTE — ED Provider Notes (Signed)
[facility]ERGENCY DEPARTMENT Provider Note   CSN: [id number] Arrival date & time: 11/17/18  0946     History   Chief Complaint Chief Complaint  Patient presents with  . Shortness of Breath    HPI Stephanie Schultz is a 63 y.o. female for evaluation of shortness of breath.  Patient states she is felt increased shortness of breath today.  She tried her albuterol inhaler at home, but had no relief.  She denies fevers, chills, sore throat, chest pain, nausea, vomiting, abdominal pain, urinary symptoms, normal bowel movements.  Patient states she feels like she is fluid overloaded, although although is taking her Lasix as prescribed.  She sees [name] with pulmonology, and Dr. Radford Pax with cardiology.  She denies recent change in her medicines.  She denies sick contacts.  She recently finished a course of prednisone. She is on O2 at baseline, 2.5-3 L.   Additional history obtained per chart review, patient with a history of CHF, COPD on oxygen, diabetes, hyperlipidemia, hypertension.   HPI  Past Medical History:  Diagnosis Date  . Anemia    chronis  . Anxiety   . Arthritis   . Cervical cancer ([place]) 1990   cervical   . Chronic diastolic CHF (congestive heart failure) ([place]) 08/10/2016  . COPD (chronic obstructive pulmonary disease) ([place])    O2 dependent. Pulmo: [name]  . Diabetes mellitus without complication ([place])    a. A1c 8.3 in 11/2015  . Emphysema   . Gallstones    s/p cholecystectomy  . GERD (gastroesophageal reflux disease)   . Gout   . History of home oxygen therapy    "2.5L; 24/7" (08/10/2016)  . Hx of cardiac catheterization    a. LHC at [facility] in [place], [place] 09/2008:  Normal coronary arteries EF 70%. b. LHC 11/2016: normal cors, normal LVEDP, EF 55-65%.  . Hyperlipidemia   . Hypertension   . Morbid obesity ([place])   . OSA on CPAP    CPAP at night   . Pneumonia   . Sickle cell trait ([place])   . Supplemental oxygen dependent    2L  CONTINUOUSLEY  . Tobacco abuse    a. up to 3ppd from age 78 to 54, now 1/4 ppd (01/2013) >> Quit 10/2015    Patient Active Problem List   Diagnosis Date Noted  . Dyspnea 08/23/2017  . Chronic diastolic CHF (congestive heart failure) ([place]) 08/10/2016  . S/P laparoscopic cholecystectomy 12/13/2015  . Chronic respiratory failure ([place]) 03/31/2015  . Rib fracture 03/31/2015  . Arthritis 12/13/2014  . Gout 03/03/2014  . Diabetic neuropathy ([place]) 03/03/2014  . Carpal tunnel syndrome 03/03/2014  . DM type 2 (diabetes mellitus, type 2) ([place]) 05/22/2013  . Anxiety 05/22/2013  . Transaminasemia 05/09/2013  . Leukocytosis 04/27/2013  . Tobacco abuse   . Chest pain   . Sickle cell trait ([place])   . Morbid obesity ([place])   . Diastolic dysfunction 40/98/1191  . Hoarseness 12/22/2012  . Epistaxis, recurrent 07/08/2012  . GERD (gastroesophageal reflux disease) 09/20/2011  . Pulmonary nodule 09/20/2011  . COPD (chronic obstructive pulmonary disease) ([place]) 04/04/2011  . Diabetes mellitus type 2, controlled ([place]) 04/04/2011  . Hypertension 04/04/2011  . Chronic allergic rhinitis 04/04/2011    Past Surgical History:  Procedure Laterality Date  . BREAST BIOPSY    . CARDIAC CATHETERIZATION    . CARDIAC CATHETERIZATION N/A 12/03/2016   Procedure: Left Heart Cath and Coronary Angiography;  Surgeon: Peter M Martinique, MD;  Location: [facility];  Service: Cardiovascular;  Laterality: N/A;  . CHOLECYSTECTOMY N/A 12/13/2015   Procedure: LAPAROSCOPIC CHOLECYSTECTOMY;  Surgeon: Ralene Ok, MD;  Location: [facility];  Service: General;  Laterality: N/A;  . COLONOSCOPY  09/05/2012   Procedure: COLONOSCOPY;  Surgeon: Beryle Beams, MD;  Location: [facility];  Service: Endoscopy;  Laterality: N/A;  . COLONOSCOPY WITH PROPOFOL N/A 09/02/2015   Procedure: COLONOSCOPY WITH PROPOFOL;  Surgeon: Carol Ada, MD;  Location: [facility];  Service: Endoscopy;  Laterality: N/A;  . ESOPHAGOGASTRODUODENOSCOPY  (EGD) WITH PROPOFOL N/A 03/29/2017   Procedure: ESOPHAGOGASTRODUODENOSCOPY (EGD) WITH PROPOFOL;  Surgeon: Carol Ada, MD;  Location: [facility];  Service: Endoscopy;  Laterality: N/A;  . TUBAL LIGATION       OB History   No obstetric history on file.      Home Medications    Prior to Admission medications   Medication Sig Start Date End Date Taking? Authorizing Provider  albuterol (PROVENTIL) (2.5 MG/3ML) 0.083% nebulizer solution Take 3 mLs (2.5 mg total) by nebulization 4 (four) times daily as needed for wheezing or shortness of breath. 12/16/17  Yes Young, Tarri Fuller D, MD  allopurinol (ZYLOPRIM) 100 MG tablet Take 100 mg by mouth daily.   Yes [provider]  ALPRAZolam Duanne Moron) 1 MG tablet Take 1 mg by mouth at bedtime as needed for sleep.  04/13/16  Yes [provider]  aspirin EC 81 MG tablet Take 81 mg by mouth daily.   Yes [provider]  Cholecalciferol (VITAMIN D PO) Take 1 tablet by mouth daily.   Yes [provider]  clobetasol cream (TEMOVATE) 9.48 % Apply 1 application topically daily as needed (irritation from pull ups).  03/25/17  Yes [provider]  fluticasone (FLONASE) 50 MCG/ACT nasal spray Place 1 spray into both nostrils daily as needed (congestion). 08/26/17  Yes Aline August, MD  Fluticasone-Umeclidin-Vilant (TRELEGY ELLIPTA) 100-62.5-25 MCG/INH AEPB Inhale 1 puff into the lungs daily. 09/18/18  Yes Collene Gobble, MD  furosemide (LASIX) 40 MG tablet Take 1 tablet in the morning and 0.5 tablet in the evening Patient taking differently: Take 20-40 mg by mouth See admin instructions. Take 1 tablet in the morning and 0.5 tablet in the evening 04/23/18  Yes Turner, Traci R, MD  glipiZIDE (GLUCOTROL) 10 MG tablet Take 10 mg by mouth 2 (two) times daily before a meal.    Yes [provider]  HYDROcodone-acetaminophen (NORCO) 7.5-325 MG tablet Take 1 tablet by mouth every 6 (six) hours as needed for moderate pain.   Yes  [provider]  insulin glargine (LANTUS) 100 unit/mL SOPN Inject 18 Units into the skin at bedtime.    Yes [provider]  Linaclotide (LINZESS) 145 MCG CAPS capsule Take 145 mcg by mouth daily.    Yes [provider]  loratadine (CLARITIN) 10 MG tablet Take 10 mg by mouth daily.   Yes [provider]  metoprolol succinate (TOPROL-XL) 50 MG 24 hr tablet Take 1 tablet (50 mg total) by mouth daily. Take with or immediately following a meal. 09/23/18  Yes Rosita Fire, Brittainy M, PA-C  ondansetron (ZOFRAN ODT) 4 MG disintegrating tablet Take 1 tablet (4 mg total) by mouth every 8 (eight) hours as needed for nausea or vomiting. 03/01/17  Yes Blanchie Dessert, MD  OXYGEN Inhale 2.5 L into the lungs continuous.    Yes [provider]  Polyethyl Glycol-Propyl Glycol (SYSTANE ULTRA OP) Place 2 drops into both eyes 2 (two) times  daily.   Yes [provider]  Potassium Chloride ER 20 MEQ TBCR Take 20 mEq by mouth daily. 12/04/16  Yes Arbutus Leas, NP  PROAIR HFA 108 (90 Base) MCG/ACT inhaler INHALE 2 PUFFS BY MOUTH EVERY 4 HOURS AS NEEDED FOR WHEEZING Patient taking differently: Inhale 2 puffs into the lungs every 4 (four) hours as needed for wheezing or shortness of breath.  06/14/16  Yes Collene Gobble, MD  ranitidine (ZANTAC 75) 75 MG tablet Take 1 tablet (75 mg total) 2 (two) times daily by mouth. 09/18/17  Yes Simmons, Brittainy M, PA-C  sitaGLIPtin (JANUVIA) 100 MG tablet Take 100 mg by mouth daily.   Yes [provider]  spironolactone (ALDACTONE) 25 MG tablet Take 0.5 tablets (12.5 mg total) by mouth daily. 12/05/16  Yes Arbutus Leas, NP  zolpidem (AMBIEN) 10 MG tablet Take 10 mg by mouth at bedtime.   Yes [provider]  guaiFENesin (MUCINEX) 600 MG 12 hr tablet Take 1 tablet (600 mg total) by mouth 2 (two) times daily. Patient not taking: Reported on 11/17/2018 08/26/17   Aline August, MD  pantoprazole (PROTONIX) 40 MG tablet  Take 1 tablet (40 mg total) by mouth daily. Patient not taking: Reported on 11/17/2018 08/26/17   Aline August, MD  predniSONE (DELTASONE) 10 MG tablet Take 4 tablets (40 mg total) by mouth daily for 5 days. 11/17/18 11/22/18  Brayen Bunn, PA-C    Family History Family History  Problem Relation Age of Onset  . Other Father        unaware of father's medical history  . Diabetes Mother        alive @ 27  . Myasthenia gravis Mother   . Lung cancer Paternal Aunt   . Lung cancer Paternal Grandfather   . Other Other        multiple siblings a&w.  . Heart attack Neg Hx   . Heart failure Neg Hx   . Breast cancer Neg Hx     Social History Social History   Tobacco Use  . Smoking status: Former Smoker    Packs/day: 0.50    Years: 36.00    Pack years: 18.00    Types: Cigarettes    Last attempt to quit: 11/11/2015    Years since quitting: 3.0  . Smokeless tobacco: Never Used  . Tobacco comment: Approx 90 pk-yrs (up to 3ppd until ~ 2009). Smoking 3 cigs per day now.  Substance Use Topics  . Alcohol use: No    Alcohol/week: 0.0 standard drinks  . Drug use: No     Allergies   Acetaminophen   Review of Systems Review of Systems  Respiratory: Positive for shortness of breath.   All other systems reviewed and are negative.    Physical Exam Updated Vital Signs BP 128/87 (BP Location: Left Arm)   Pulse 92   Temp 98.7 F (37.1 C) (Oral)   Resp 18   Ht 5' (1.524 m)   Wt 98.9 kg   LMP 06/04/2000 (LMP Unknown) Comment: tubal ligation  SpO2 94%   BMI 42.58 kg/m   Physical Exam Vitals signs and nursing note reviewed.  Constitutional:      General: She is not in acute distress.    Appearance: She is well-developed.     Comments: Sitting comfortably in the bed in no acute distress.  HENT:     Head: Normocephalic and atraumatic.  Eyes:     Conjunctiva/sclera: Conjunctivae normal.  Pupils: Pupils are equal, round, and reactive to light.  Neck:      Musculoskeletal: Normal range of motion and neck supple.  Cardiovascular:     Rate and Rhythm: Normal rate and regular rhythm.  Pulmonary:     Effort: Pulmonary effort is normal. No respiratory distress.     Breath sounds: Examination of the right-lower field reveals decreased breath sounds. Examination of the left-lower field reveals decreased breath sounds. Decreased breath sounds present. No wheezing.     Comments: On o2 via [place].  Decreased lung sounds of bilateral lobes Abdominal:     General: There is no distension.     Palpations: Abdomen is soft.     Tenderness: There is no abdominal tenderness.  Musculoskeletal: Normal range of motion.     Right lower leg: No edema.     Left lower leg: No edema.     Comments: No lower leg edema.  Pedal pulses intact bilaterally.  Skin:    General: Skin is warm and dry.     Capillary Refill: Capillary refill takes less than 2 seconds.  Neurological:     Mental Status: She is alert and oriented to person, place, and time.      ED Treatments / Results  Labs (all labs ordered are listed, but only abnormal results are displayed) Labs Reviewed  COMPREHENSIVE METABOLIC PANEL - Abnormal; Notable for the following components:      Result Value   Glucose, Bld 216 (*)    Creatinine, Ser 1.02 (*)    Alkaline Phosphatase 37 (*)    GFR calc non Af Amer 60 (*)    All other components within normal limits  CBC WITH DIFFERENTIAL/PLATELET    EKG EKG Interpretation  Date/Time:  Monday November 17 2018 09:54:58 EST Ventricular Rate:  88 PR Interval:    QRS Duration: 104 QT Interval:  411 QTC Calculation: 498 R Axis:   66 Text Interpretation:  Sinus rhythm Low voltage, precordial leads Borderline prolonged QT interval Baseline wander in lead(s) V3 V4 V5 V6 No significant change was found Confirmed by Jola Schmidt [phone]) on 11/17/2018 10:08:07 AM   Radiology Dg Chest 2 View  Result Date: 11/17/2018 CLINICAL DATA:  Shortness of breath EXAM: CHEST  - 2 VIEW COMPARISON:  07/18/2018 FINDINGS: Normal heart size and stable mediastinal contours. Chronic interstitial coarsening. There is no edema, consolidation, effusion, or pneumothorax. Remote and healed left rib fractures. Artifact from EKG leads. IMPRESSION: Stable from prior.  No evidence of acute disease. Electronically Signed   By: Monte Fantasia M.D.   On: 11/17/2018 11:29    Procedures Procedures (including critical care time)  Medications Ordered in ED Medications  furosemide (LASIX) injection 20 mg (has no administration in time range)  ipratropium-albuterol (DUONEB) 0.5-2.5 (3) MG/3ML nebulizer solution 3 mL (has no administration in time range)  albuterol (PROVENTIL) (2.5 MG/3ML) 0.083% nebulizer solution 2.5 mg (2.5 mg Nebulization Given 11/17/18 1032)  magnesium sulfate IVPB 2 g 50 mL (0 g Intravenous Stopped 11/17/18 1416)     Initial Impression / Assessment and Plan / ED Course  I have reviewed the triage vital signs and the nursing notes.  Pertinent labs & imaging results that were available during my care of the patient were reviewed by me and considered in my medical decision making (see chart for details).     Patient presenting for evaluation of increase shortness of breath.  Physical exam reassuring, patient is afebrile and appears nontoxic.  No tachycardia.  Sats  stable on oxygen.  Patient states she feels better after receiving Solu-Medrol and breathing treatment with EMS.  As such, likely COPD exacerbation.  No obvious peripheral edema, however, consider CHF exacerbation.  Will obtain labs, chest x-ray, and reassess.  Breathing treatment given and mag ordered.  Chest x-ray viewed interpreted by me, no pneumonia, pneumothorax, effusion.  Labs reassuring, at baseline creatinine.  No leukocytosis.  As such, likely COPD exacerbation.  Patient reports no significant improvement with breathing treatment given in the ED.  Will ambulate on pulse ox and assess.  On pulse ox,  patient's SPO2 dropped to 83 while on oxygen.  Additionally, she was very short of breath with ambulation.  Concerned about her ability to go home safely.  As such, will admit for COPD exacerbation.  Repeat wheezing treatment given.  Patient states she feels fluid overloaded.  Although I do not see any obvious signs of CHF exacerbation, will give small dose of Lasix to assess for improvement.  Discussed with Dr. Lorin Mercy from [facility], pt to be admitted.   Final Clinical Impressions(s) / ED Diagnoses   Final diagnoses:  SOB (shortness of breath)  COPD exacerbation [facility])    ED Discharge Orders         Ordered    predniSONE (DELTASONE) 10 MG tablet  Daily     11/17/18 1357           Franchot Heidelberg, PA-C 11/17/18 Liborio Negron Torres, MD 11/18/18 [phone]

## 2018-11-17 NOTE — Discharge Instructions (Addendum)
Follow up with your doctor for further evaluation of your symptoms.  It is important that you discuss your fluid pills and feeling like you continue to be fluid overloaded. Continue taking your home medications as prescribed. Return to the ER with any new, worsening, or concerning symptoms.     COPD and Physical Activity Chronic obstructive pulmonary disease (COPD) is a long-term (chronic) condition that affects the lungs. COPD is a general term that can be used to describe many different lung problems that cause lung swelling (inflammation) and limit airflow, including chronic bronchitis and emphysema. The main symptom of COPD is shortness of breath, which makes it harder to do even simple tasks. This can also make it harder to exercise and be active. Talk with your health care provider about treatments to help you breathe better and actions you can take to prevent breathing problems during physical activity. What are the benefits of exercising with COPD? Exercising regularly is an important part of a healthy lifestyle. You can still exercise and do physical activities even though you have COPD. Exercise and physical activity improve your shortness of breath by increasing blood flow (circulation). This causes your heart to pump more oxygen through your body. Moderate exercise can improve your:  Oxygen use.  Energy level.  Shortness of breath.  Strength in your breathing muscles.  Heart health.  Sleep.  Self-esteem and feelings of self-worth.  Depression, stress, and anxiety levels. Exercise can benefit everyone with COPD. The severity of your disease may affect how hard you can exercise, especially at first, but everyone can benefit. Talk with your health care provider about how much exercise is safe for you, and which activities and exercises are safe for you. What actions can I take to prevent breathing problems during physical activity?  Sign up for a pulmonary rehabilitation  program. This type of program may include: ? Education about lung diseases. ? Exercise classes that teach you how to exercise and be more active while improving your breathing. This usually involves:  Exercise using your lower extremities, such as a stationary bicycle.  About 30 minutes of exercise, 2 to 5 times per week, for 6 to 12 weeks  Strength training, such as push ups or leg lifts. ? Nutrition education. ? Group classes in which you can talk with others who also have COPD and learn ways to manage stress.  If you use an oxygen tank, you should use it while you exercise. Work with your health care provider to adjust your oxygen for your physical activity. Your resting flow rate is different from your flow rate during physical activity.  While you are exercising: ? Take slow breaths. ? Pace yourself and do not try to go too fast. ? Purse your lips while breathing out. Pursing your lips is similar to a kissing or whistling position. ? If doing exercise that uses a quick burst of effort, such as weight lifting:  Breathe in before starting the exercise.  Breathe out during the hardest part of the exercise (such as raising the weights). Where to find support You can find support for exercising with COPD from:  Your health care provider.  A pulmonary rehabilitation program.  Your local health department or community health programs.  Support groups, online or in-person. Your health care provider may be able to recommend support groups. Where to find more information You can find more information about exercising with COPD from:  American Lung Association: ClassInsider.se.  COPD Foundation: https://www.rivera.net/. Contact a health care  provider if:  Your symptoms get worse.  You have chest pain.  You have nausea.  You have a fever.  You have trouble talking or catching your breath.  You want to start a new exercise program or a new activity. Summary  COPD is a general term  that can be used to describe many different lung problems that cause lung swelling (inflammation) and limit airflow. This includes chronic bronchitis and emphysema.  Exercise and physical activity improve your shortness of breath by increasing blood flow (circulation). This causes your heart to provide more oxygen to your body.  Contact your health care provider before starting any exercise program or new activity. Ask your health care provider what exercises and activities are safe for you. This information is not intended to replace advice given to you by your health care provider. Make sure you discuss any questions you have with your health care provider. Document Released: 11/21/2017 Document Revised: 11/21/2017 Document Reviewed: 11/21/2017 Elsevier Interactive Patient Education  2019 [employer].

## 2018-11-17 NOTE — ED Triage Notes (Signed)
To ED via [facility] from school, pt short of breath, hx of asthma/chf --  Pt received solumedrol 125mg  IV, albuterol 5mg /atrovent 0.5mg  neb-- recently finished prednisone dose pak ,  Pt has periorbital edema , slight- per pt.

## 2018-11-18 ENCOUNTER — Other Ambulatory Visit: Payer: Self-pay

## 2018-11-18 ENCOUNTER — Encounter (HOSPITAL_COMMUNITY): Payer: Self-pay | Admitting: General Practice

## 2018-11-18 DIAGNOSIS — Z79899 Other long term (current) drug therapy: Secondary | ICD-10-CM | POA: Diagnosis not present

## 2018-11-18 DIAGNOSIS — Z9981 Dependence on supplemental oxygen: Secondary | ICD-10-CM | POA: Diagnosis not present

## 2018-11-18 DIAGNOSIS — J441 Chronic obstructive pulmonary disease with (acute) exacerbation: Secondary | ICD-10-CM | POA: Diagnosis not present

## 2018-11-18 DIAGNOSIS — Z8541 Personal history of malignant neoplasm of cervix uteri: Secondary | ICD-10-CM | POA: Diagnosis not present

## 2018-11-18 DIAGNOSIS — J9621 Acute and chronic respiratory failure with hypoxia: Secondary | ICD-10-CM | POA: Diagnosis not present

## 2018-11-18 DIAGNOSIS — Z66 Do not resuscitate: Secondary | ICD-10-CM | POA: Diagnosis present

## 2018-11-18 DIAGNOSIS — R0602 Shortness of breath: Secondary | ICD-10-CM | POA: Diagnosis present

## 2018-11-18 DIAGNOSIS — E119 Type 2 diabetes mellitus without complications: Secondary | ICD-10-CM | POA: Diagnosis not present

## 2018-11-18 DIAGNOSIS — I110 Hypertensive heart disease with heart failure: Secondary | ICD-10-CM | POA: Diagnosis present

## 2018-11-18 DIAGNOSIS — Z7951 Long term (current) use of inhaled steroids: Secondary | ICD-10-CM | POA: Diagnosis not present

## 2018-11-18 DIAGNOSIS — G4733 Obstructive sleep apnea (adult) (pediatric): Secondary | ICD-10-CM | POA: Diagnosis present

## 2018-11-18 DIAGNOSIS — Z8701 Personal history of pneumonia (recurrent): Secondary | ICD-10-CM | POA: Diagnosis not present

## 2018-11-18 DIAGNOSIS — E1165 Type 2 diabetes mellitus with hyperglycemia: Secondary | ICD-10-CM | POA: Diagnosis not present

## 2018-11-18 DIAGNOSIS — Z9851 Tubal ligation status: Secondary | ICD-10-CM | POA: Diagnosis not present

## 2018-11-18 DIAGNOSIS — Z6841 Body Mass Index (BMI) 40.0 and over, adult: Secondary | ICD-10-CM | POA: Diagnosis not present

## 2018-11-18 DIAGNOSIS — Z794 Long term (current) use of insulin: Secondary | ICD-10-CM

## 2018-11-18 DIAGNOSIS — E1140 Type 2 diabetes mellitus with diabetic neuropathy, unspecified: Secondary | ICD-10-CM | POA: Diagnosis present

## 2018-11-18 DIAGNOSIS — I5032 Chronic diastolic (congestive) heart failure: Secondary | ICD-10-CM | POA: Diagnosis not present

## 2018-11-18 DIAGNOSIS — D573 Sickle-cell trait: Secondary | ICD-10-CM | POA: Diagnosis present

## 2018-11-18 DIAGNOSIS — Z7982 Long term (current) use of aspirin: Secondary | ICD-10-CM | POA: Diagnosis not present

## 2018-11-18 DIAGNOSIS — K219 Gastro-esophageal reflux disease without esophagitis: Secondary | ICD-10-CM | POA: Diagnosis present

## 2018-11-18 DIAGNOSIS — Z87891 Personal history of nicotine dependence: Secondary | ICD-10-CM | POA: Diagnosis not present

## 2018-11-18 DIAGNOSIS — M109 Gout, unspecified: Secondary | ICD-10-CM | POA: Diagnosis present

## 2018-11-18 DIAGNOSIS — M1990 Unspecified osteoarthritis, unspecified site: Secondary | ICD-10-CM | POA: Diagnosis present

## 2018-11-18 DIAGNOSIS — E785 Hyperlipidemia, unspecified: Secondary | ICD-10-CM | POA: Diagnosis present

## 2018-11-18 LAB — GLUCOSE, CAPILLARY
Glucose-Capillary: 285 mg/dL — ABNORMAL HIGH (ref 70–99)
Glucose-Capillary: 324 mg/dL — ABNORMAL HIGH (ref 70–99)
Glucose-Capillary: 290 mg/dL — ABNORMAL HIGH (ref 70–99)
Glucose-Capillary: 304 mg/dL — ABNORMAL HIGH (ref 70–99)
Glucose-Capillary: 305 mg/dL — ABNORMAL HIGH (ref 70–99)

## 2018-11-18 LAB — HEMOGLOBIN A1C
Hgb A1c MFr Bld: 7.9 % — ABNORMAL HIGH (ref 4.8–5.6)
Mean Plasma Glucose: 180.03 mg/dL

## 2018-11-18 LAB — HIV ANTIBODY (ROUTINE TESTING W REFLEX): HIV SCREEN 4TH GENERATION: NONREACTIVE

## 2018-11-18 MED ORDER — INSULIN ASPART 100 UNIT/ML ~~LOC~~ SOLN
5.0000 [IU] | Freq: Three times a day (TID) | SUBCUTANEOUS | Status: DC
  Administered 2018-11-18 – 2018-11-19 (×2): 5 [IU] via SUBCUTANEOUS

## 2018-11-18 MED ORDER — FLUTICASONE PROPIONATE 50 MCG/ACT NA SUSP
2.0000 | Freq: Every day | NASAL | Status: DC
  Administered 2018-11-18: 2 via NASAL
  Filled 2018-11-18 (×2): qty 16

## 2018-11-18 MED ORDER — METHYLPREDNISOLONE SODIUM SUCC 40 MG IJ SOLR
40.0000 mg | Freq: Two times a day (BID) | INTRAMUSCULAR | Status: DC
  Administered 2018-11-18 – 2018-11-19 (×2): 40 mg via INTRAVENOUS
  Filled 2018-11-18 (×2): qty 1

## 2018-11-18 MED ORDER — INSULIN GLARGINE 100 UNIT/ML ~~LOC~~ SOLN
20.0000 [IU] | Freq: Every day | SUBCUTANEOUS | Status: DC
  Administered 2018-11-18: 20 [IU] via SUBCUTANEOUS
  Filled 2018-11-18: qty 0.2

## 2018-11-18 NOTE — Progress Notes (Signed)
Inpatient Diabetes Program Recommendations  AACE/ADA: New Consensus Statement on Inpatient Glycemic Control (2015)  Target Ranges:  Prepandial:   less than 140 mg/dL      Peak postprandial:   less than 180 mg/dL (1-2 hours)      Critically ill patients:  140 - 180 mg/dL   Lab Results  Component Value Date   GLUCAP 324 (H) 11/18/2018   HGBA1C 7.9 (H) 11/18/2018    Review of Glycemic Control Results for Stephanie Schultz, Stephanie Schultz [facility] (MRN [id number]) as of 11/18/2018 09:47  Ref. Range 11/17/2018 18:27 11/17/2018 22:12 11/18/2018 06:47 11/18/2018 07:49  Glucose-Capillary Latest Ref Range: 70 - 99 mg/dL 309 (H) 278 (H) 285 (H) 324 (H)   Diabetes history: DM2 Outpatient Diabetes medications: Lantus 18 units + Glucotrol 10 mg bid + Januvia 100 mg qd Current orders for Inpatient glycemic control: Lantus 18 units + Novolog resistant scale tid + hs 0-5 units  Inpatient Diabetes Program Recommendations:   Noted steroids decreased to Prednisone 40 mg daily. While on steroids and in the hospital consider: -Novolog 5 units tid meal coverage if eats 50%  Thank you, Nani Gasser. Emiko Osorto, RN, MSN, CDE  Diabetes Coordinator Inpatient Glycemic Control Team Team Pager [phone] (8am-5pm) 11/18/2018 9:49 AM

## 2018-11-18 NOTE — Evaluation (Signed)
Physical Therapy Evaluation and Discharge Patient Details Name: Stephanie Schultz MRN: [id number] DOB: [birth year] Today's Date: 11/18/2018   History of Present Illness  Pt is a 63 y/o female who presents with increased SOB. PMH significant for sickle cell trait, PNA, OSA on CPAP, HTN, 2.5L/min home O2, gout, emphysema, COPD, DM, cervical CA.   Clinical Impression  Patient evaluated by Physical Therapy with no further acute PT needs identified. All education has been completed and the patient has no further questions. At the time of PT eval pt was able to perform transfers and ambulation with gross modified independence to independence without an AD. During gait training, O2 sats decreased to 83% on 3L/min supplemental O2. Sats able to be improved to 95% with pursed-lip breathing and increase in supplemental O2 to 4L/min. At rest pt maintaining sats in the 90's on 3L/min supplemental O2 at end of session. Overall pt functioning well and do not feel she needs further skilled PT in the acute setting. If able, a pulmonary rehab program may be beneficial. See below for any follow-up Physical Therapy or equipment needs. PT is signing off. Thank you for this referral.     Follow Up Recommendations No PT follow up;Supervision - Intermittent(Pulmonary rehab program )    Equipment Recommendations  None recommended by PT    Recommendations for Other Services       Precautions / Restrictions Precautions Precautions: Fall Precaution Comments: watch O2 Restrictions Weight Bearing Restrictions: No      Mobility  Bed Mobility Overal bed mobility: Modified Independent             General bed mobility comments: Increased time  Transfers Overall transfer level: Independent Equipment used: None             General transfer comment: No assist required.   Ambulation/Gait Ambulation/Gait assistance: Modified independent (Device/Increase time) Gait Distance (Feet): 120 Feet Assistive  device: None Gait Pattern/deviations: Step-through pattern;Decreased stride length;Wide base of support Gait velocity: Decreased Gait velocity interpretation: 1.31 - 2.62 ft/sec, indicative of limited community ambulator General Gait Details: Wide BOS likely due to body habitus. Pt was able to ambulate well in room and out to hall. Total of ~120 feet. No unsteadiness noted and no assist required.   Stairs            Wheelchair Mobility    Modified Rankin (Stroke Patients Only)       Balance Overall balance assessment: No apparent balance deficits (not formally assessed)                                           Pertinent Vitals/Pain Pain Assessment: No/denies pain    Home Living Family/patient expects to be discharged to:: Private residence Living Arrangements: Alone Available Help at Discharge: Family;Personal care attendant;Available PRN/intermittently Type of Home: Apartment Home Access: Level entry     Home Layout: One level Home Equipment: Walker - 2 wheels;Walker - 4 wheels;Cane - single point;Electric scooter;Hand held shower head;Grab bars - tub/shower;Shower seat Additional Comments: Aide comes in 7 days a week. 5 hours at a time.     Prior Function Level of Independence: Needs assistance   Gait / Transfers Assistance Needed: Typically able to ambulate without an AD      Comments: Aide assists with housework, some bathing, cooking, laundry, some dressing      Hand Dominance   Dominant  Hand: Right    Extremity/Trunk Assessment   Upper Extremity Assessment Upper Extremity Assessment: Defer to OT evaluation    Lower Extremity Assessment Lower Extremity Assessment: Generalized weakness    Cervical / Trunk Assessment Cervical / Trunk Assessment: Other exceptions Cervical / Trunk Exceptions: Forward head posture with rounded shoulders.  Communication   Communication: No difficulties  Cognition Arousal/Alertness:  Awake/alert Behavior During Therapy: WFL for tasks assessed/performed Overall Cognitive Status: Within Functional Limits for tasks assessed                                        General Comments      Exercises     Assessment/Plan    PT Assessment Patent does not need any further PT services  PT Problem List         PT Treatment Interventions      PT Goals (Current goals can be found in the Care Plan section)  Acute Rehab PT Goals Patient Stated Goal: Be able to go to her classes at [employer] PT Goal Formulation: All assessment and education complete, DC therapy    Frequency     Barriers to discharge        Co-evaluation               AM-PAC PT "6 Clicks" Mobility  Outcome Measure Help needed turning from your back to your side while in a flat bed without using bedrails?: None Help needed moving from lying on your back to sitting on the side of a flat bed without using bedrails?: None Help needed moving to and from a bed to a chair (including a wheelchair)?: None Help needed standing up from a chair using your arms (e.g., wheelchair or bedside chair)?: None Help needed to walk in hospital room?: None Help needed climbing 3-5 steps with a railing? : A Little 6 Click Score: 23    End of Session Equipment Utilized During Treatment: Oxygen Activity Tolerance: Patient tolerated treatment well Patient left: in chair;with call bell/phone within reach Nurse Communication: Mobility status PT Visit Diagnosis: Difficulty in walking, not elsewhere classified (R26.2)    Time: 2119-4174 PT Time Calculation (min) (ACUTE ONLY): 28 min   Charges:   PT Evaluation $PT Eval Low Complexity: 1 Low PT Treatments $Gait Training: 8-22 mins        Rolinda Roan, PT, DPT Acute Rehabilitation Services Pager: [phone] Office: [phone]   Thelma Comp 11/18/2018, 10:37 AM

## 2018-11-18 NOTE — Evaluation (Signed)
Occupational Therapy Evaluation Patient Details Name: Stephanie Schultz MRN: [id number] DOB: 1956/06/02 Today's Date: 11/18/2018    History of Present Illness Pt is a 63 y/o female who presents with increased SOB. PMH significant for sickle cell trait, PNA, OSA on CPAP, HTN, 2.5L/min home O2, gout, emphysema, COPD, DM, cervical CA.    Clinical Impression   Pt is at baseline level of function with ADLs and mobility with no AD. Personal care attendant assists with ADLs. Pt reports that when she is having a good day, she only requires sup with showers and can dress herself. Pt provided with energy conservation education with handouts. All education completed and no further acute OT is indicated at this time, OT will sign off    Follow Up Recommendations  No OT follow up    Equipment Recommendations  None recommended by OT    Recommendations for Other Services Other (comment)(pulmonary rehab)     Precautions / Restrictions Precautions Precautions: Fall Precaution Comments: watch O2 Restrictions Weight Bearing Restrictions: No      Mobility Bed Mobility               General bed mobility comments: pt in recliner upon arrival  Transfers Overall transfer level: Independent Equipment used: None             General transfer comment: No assist required.     Balance Overall balance assessment: No apparent balance deficits (not formally assessed)                                         ADL either performed or assessed with clinical judgement   ADL Overall ADL's : At baseline                                       General ADL Comments: personal care attendant assists with ADLs. Pt reports that when she is having a good day, she only requires sup with showers and can dress herself     Vision Patient Visual Report: No change from baseline       Perception     Praxis      Pertinent Vitals/Pain Pain Assessment: No/denies  pain     Hand Dominance Right   Extremity/Trunk Assessment Upper Extremity Assessment Upper Extremity Assessment: Overall WFL for tasks assessed   Lower Extremity Assessment Lower Extremity Assessment: Defer to PT evaluation   Cervical / Trunk Assessment Cervical / Trunk Assessment: Other exceptions Cervical / Trunk Exceptions: Forward head posture with rounded shoulders.   Communication Communication Communication: No difficulties   Cognition Arousal/Alertness: Awake/alert Behavior During Therapy: WFL for tasks assessed/performed Overall Cognitive Status: Within Functional Limits for tasks assessed                                     General Comments       Exercises     Shoulder Instructions      Home Living Family/patient expects to be discharged to:: Private residence Living Arrangements: Alone Available Help at Discharge: Family;Personal care attendant;Available PRN/intermittently Type of Home: Apartment Home Access: Level entry     Home Layout: One level     Bathroom Shower/Tub: Occupational psychologist: Handicapped height  Home Equipment: [place] - 2 wheels;Walker - 4 wheels;Cane - single point;Electric scooter;Hand held shower head;Grab bars - tub/shower;Shower seat          Prior Functioning/Environment Level of Independence: Needs assistance    ADL's / Homemaking Assistance Needed: personal care attendant assists with ADLs. Pt reports that when she is having a good day, she only requires sup with showers and can dress herself   Comments: Aide assists with housework, some bathing, cooking, laundry, some dressing         OT Problem List: Decreased activity tolerance      OT Treatment/Interventions:      OT Goals(Current goals can be found in the care plan section) Acute Rehab OT Goals Patient Stated Goal: Be able to go to her classes at [employer]  OT Frequency:     Barriers to D/C:    no barriers        Co-evaluation              AM-PAC OT "6 Clicks" Daily Activity     Outcome Measure Help from another person eating meals?: None Help from another person taking care of personal grooming?: None Help from another person toileting, which includes using toliet, bedpan, or urinal?: None Help from another person bathing (including washing, rinsing, drying)?: None Help from another person to put on and taking off regular upper body clothing?: None Help from another person to put on and taking off regular lower body clothing?: A Little 6 Click Score: 23   End of Session Equipment Utilized During Treatment: Oxygen  Activity Tolerance: Patient tolerated treatment well Patient left: in chair;with call bell/phone within reach  OT Visit Diagnosis: Other (comment)(low endurance)                Time: 1962-2297 OT Time Calculation (min): 24 min Charges:  OT General Charges $OT Visit: 1 Visit OT Evaluation $OT Eval Low Complexity: 1 Low OT Treatments $Therapeutic Activity: 8-22 mins    Britt Bottom 11/18/2018, 1:50 PM

## 2018-11-18 NOTE — Progress Notes (Signed)
Progress Note    Stephanie Schultz  TMH:[id number] DOB: September 13, 1956  DOA: 11/17/2018 PCP: [name], MD    Brief Narrative:     Medical records reviewed and are as summarized below:  Stephanie Schultz is an 63 y.o. female with medical history significant ofOSA on CPAP; morbid obesity (BMI 40); HTN; HLD; COPD on 2L home O2; and chronic diastolic CHF presenting with COPD exacerbation with new O2 demand. She went to school at [employer] today and was very SOB.    Assessment/Plan:   Active Problems:   COPD (chronic obstructive pulmonary disease) (HCC)   Diabetes mellitus type 2, controlled (HCC)   Diastolic dysfunction   Morbid obesity (HCC)   Acute on chronic respiratory failure with hypoxia (HCC)  Acute on chronic respiratory failure associated with a COPD exacerbation -Patient's shortness of breath is most likely caused by acute COPD exacerbation, possibly in conjunction with OHS.  -She has history of O2-dependent COPD but is continuing to de-sat on prior regimen into the 80s -She does not have fever or leukocytosis.  -Chest x-ray is not consistent with pneumonia -She was given a continuous neb treatment in the ED with some improvement.   -Nebulizers: scheduled Duoneb and prn albuterol -Continue Breo Ellipta and Incruse Ellipta -Solu-Medrol 60 mg IV BID for another 24 hours -No antibiotics for now - no cough, low suspicion for bacterial infection -PT eval: pulmonary rehab program?  DM -A1c 7.9 -hold Glucotrol and Januvia -Continue Lantus -Cover with resistant-scale SSI and add 5 units (suspect will be difficult to control while on steroids)  Morbid obesity Estimated body mass index is 42.58 kg/m as calculated from the following:   Height as of this encounter: 5' (1.524 m).   Weight as of this encounter: 98.9 kg.  Family Communication/Anticipated D/C date and plan/Code Status   DVT prophylaxis: Lovenox ordered. Code Status: Full Code.  Family  Communication:  Disposition Plan:       Subjective:   Still feels very tight when trying to breath even at rest  Objective:    Vitals:   11/17/18 2151 11/17/18 2255 11/18/18 0500 11/18/18 0841  BP:  125/71 (!) 108/42   Pulse:  96 82   Resp:  19 19   Temp:  98.4 F (36.9 C) (!) 97.4 F (36.3 C)   TempSrc:  Oral Oral   SpO2: 96% 94% 95% 92%  Weight:      Height:        Intake/Output Summary (Last 24 hours) at 11/18/2018 1352 Last data filed at 11/18/2018 0500 Gross per 24 hour  Intake 290 ml  Output 100 ml  Net 190 ml   Filed Weights   11/17/18 0955  Weight: 98.9 kg    Exam: In chair, on [place] Lungs: not moving much air in all lung fields, appears to have increased work of breathing with just talking +BS, obese A+Ox3  Data Reviewed:   I have personally reviewed following labs and imaging studies:  Labs: Labs show the following:   Basic Metabolic Panel: Recent Labs  Lab 11/17/18 1016  NA 139  K 4.0  CL 101  CO2 27  GLUCOSE 216*  BUN 10  CREATININE 1.02*  CALCIUM 9.6   GFR Estimated Creatinine Clearance: 61.9 mL/min (A) (by C-G formula based on SCr of 1.02 mg/dL (H)). Liver Function Tests: Recent Labs  Lab 11/17/18 1016  AST 26  ALT 38  ALKPHOS 37*  BILITOT 0.5  PROT 7.6  ALBUMIN 3.8  No results for input(s): LIPASE, AMYLASE in the last 168 hours. No results for input(s): AMMONIA in the last 168 hours. Coagulation profile No results for input(s): INR, PROTIME in the last 168 hours.  CBC: Recent Labs  Lab 11/17/18 1016  WBC 7.8  NEUTROABS 5.5  HGB 12.2  HCT 37.3  MCV 84.6  PLT 241   Cardiac Enzymes: No results for input(s): CKTOTAL, CKMB, CKMBINDEX, TROPONINI in the last 168 hours. BNP (last 3 results) No results for input(s): PROBNP in the last 8760 hours. CBG: Recent Labs  Lab 11/17/18 1827 11/17/18 2212 11/18/18 0647 11/18/18 0749 11/18/18 1151  GLUCAP 309* 278* 285* 324* 290*   D-Dimer: No results for input(s):  DDIMER in the last 72 hours. Hgb A1c: Recent Labs    11/18/18 0517  HGBA1C 7.9*   Lipid Profile: No results for input(s): CHOL, HDL, LDLCALC, TRIG, CHOLHDL, LDLDIRECT in the last 72 hours. Thyroid function studies: No results for input(s): TSH, T4TOTAL, T3FREE, THYROIDAB in the last 72 hours.  Invalid input(s): FREET3 Anemia work up: No results for input(s): VITAMINB12, FOLATE, FERRITIN, TIBC, IRON, RETICCTPCT in the last 72 hours. Sepsis Labs: Recent Labs  Lab 11/17/18 1016  WBC 7.8    Microbiology No results found for this or any previous visit (from the past 240 hour(s)).  Procedures and diagnostic studies:  Dg Chest 2 View  Result Date: 11/17/2018 CLINICAL DATA:  Shortness of breath EXAM: CHEST - 2 VIEW COMPARISON:  07/18/2018 FINDINGS: Normal heart size and stable mediastinal contours. Chronic interstitial coarsening. There is no edema, consolidation, effusion, or pneumothorax. Remote and healed left rib fractures. Artifact from EKG leads. IMPRESSION: Stable from prior.  No evidence of acute disease. Electronically Signed   By: Monte Fantasia M.D.   On: 11/17/2018 11:29    Medications:   . allopurinol  100 mg Oral Daily  . aspirin EC  81 mg Oral Daily  . enoxaparin (LOVENOX) injection  40 mg Subcutaneous Q24H  . famotidine  20 mg Oral BID  . fluticasone  2 spray Each Nare Daily  . fluticasone furoate-vilanterol  1 puff Inhalation Daily   And  . umeclidinium bromide  1 puff Inhalation Daily  . furosemide  40 mg Oral q morning - 10a   And  . furosemide  20 mg Oral QPM  . insulin aspart  0-20 Units Subcutaneous TID WC  . insulin aspart  0-5 Units Subcutaneous QHS  . insulin glargine  18 Units Subcutaneous QHS  . ipratropium-albuterol  3 mL Nebulization TID  . linaclotide  145 mcg Oral Daily  . loratadine  10 mg Oral Daily  . methylPREDNISolone (SOLU-MEDROL) injection  40 mg Intravenous Q12H  . metoprolol succinate  50 mg Oral Q breakfast  . spironolactone  12.5  mg Oral Daily  . zolpidem  5 mg Oral QHS   Continuous Infusions:   LOS: 0 days   Geradine Girt  [facility]   *Please refer to [email or web address], password [email or web address] to get updated schedule on who will round on this patient, as hospitalists switch teams weekly. If 7PM-7AM, please contact night-coverage at [email or web address], password [email or web address] for any overnight needs.  11/18/2018, 1:52 PM

## 2018-11-18 NOTE — Progress Notes (Signed)
Pt ambulated in the hall way with O2 on 3L Stephanie Schultz. O2 sats dropped to 87%.

## 2018-11-19 DIAGNOSIS — I5032 Chronic diastolic (congestive) heart failure: Secondary | ICD-10-CM

## 2018-11-19 DIAGNOSIS — E1165 Type 2 diabetes mellitus with hyperglycemia: Secondary | ICD-10-CM

## 2018-11-19 DIAGNOSIS — Z794 Long term (current) use of insulin: Secondary | ICD-10-CM

## 2018-11-19 LAB — GLUCOSE, CAPILLARY
Glucose-Capillary: 382 mg/dL — ABNORMAL HIGH (ref 70–99)
Glucose-Capillary: 293 mg/dL — ABNORMAL HIGH (ref 70–99)

## 2018-11-19 MED ORDER — ORAL CARE MOUTH RINSE: 15.0000 mL | Freq: Two times a day (BID) | OROMUCOSAL | Status: DC

## 2018-11-19 MED ORDER — IPRATROPIUM-ALBUTEROL 0.5-2.5 (3) MG/3ML IN SOLN: 3.0000 mL | Freq: Four times a day (QID) | RESPIRATORY_TRACT | 0 refills | Status: DC | PRN

## 2018-11-19 MED ORDER — PREDNISONE 20 MG PO TABS: 20.0000 mg | ORAL_TABLET | Freq: Every day | ORAL | 0 refills | Status: DC

## 2018-11-19 NOTE — Progress Notes (Signed)
Inpatient Diabetes Program Recommendations  AACE/ADA: New Consensus Statement on Inpatient Glycemic Control (2015)  Target Ranges:  Prepandial:   less than 140 mg/dL      Peak postprandial:   less than 180 mg/dL (1-2 hours)      Critically ill patients:  140 - 180 mg/dL   Lab Results  Component Value Date   GLUCAP 293 (H) 11/19/2018   HGBA1C 7.9 (H) 11/18/2018    Review of Glycemic Control Results for Stephanie Schultz, Stephanie Schultz [facility] (MRN [id number]) as of 11/19/2018 12:46  Ref. Range 11/18/2018 16:56 11/18/2018 21:09 11/19/2018 08:23 11/19/2018 12:26  Glucose-Capillary Latest Ref Range: 70 - 99 mg/dL 304 (H) 305 (H) 382 (H) 293 (H)   Diabetes history: DM2 Outpatient Diabetes medications: Lantus 18 units + Glucotrol 10 mg bid + Januvia 100 mg qd Current orders for Inpatient glycemic control: Lantus 18 units + Novolog resistant scale tid + hs 0-5 units  Spoke with the patient by phone, at RN request. Patient is followed by Dr Jonelle Sidle, PCP for DM mangement and she verifies dosages as above.  Reviewed patient's current A1c of 7.9%. Explained what a A1c is and what it measures. Also reviewed goal A1c with patient, importance of good glucose control @ home, and blood sugar goals. Reviewed patho of DM, need for insulin, impact of steroids to blood glucose, differences between Prednisone and Solumedrol, vascular changes and comorbidites.  Patient checks CBGs 4 times per day. Voices concern with elevated CBGs especially since going home. Encouraged to continue with checking CBGs as usual and extensively reviewed when to call MD related to hyperglycemia. Stressed the importance of following up with PCP within the week. Patient has no further questions at this time and plans to make appointment.   Thanks, Bronson Curb, MSN, RNC-OB Diabetes Coordinator [phone] (8a-5p)

## 2018-11-19 NOTE — Care Management Note (Signed)
Case Management Note  Patient Details  Name: Stephanie Schultz MRN: [id number] Date of Birth: 1956-10-05  Subjective/Objective:                    Action/Plan:   Expected Discharge Date:  11/19/18               Expected Discharge Plan:  [place]  In-House Referral:     Discharge planning Services  CM Consult  Post Acute Care Choice:  Home Health Choice offered to:  Patient  DME Arranged:  N/A DME Agency:  NA  HH Arranged:  RN [place] Agency:  [place]  Status of Service:  Completed, signed off  If discussed at [place] of Stay Meetings, dates discussed:    Additional Comments:  Marilu Favre, RN 11/19/2018, 11:27 AM

## 2018-11-19 NOTE — Plan of Care (Signed)
  Problem: Education: Goal: Knowledge of General Education information will improve Description Including pain rating scale, medication(s)/side effects and non-pharmacologic comfort measures Outcome: Progressing   Problem: Health Behavior/Discharge Planning: Goal: Ability to manage health-related needs will improve Outcome: Progressing   Problem: Clinical Measurements: Goal: Will remain free from infection Outcome: Progressing   Problem: Activity: Goal: Risk for activity intolerance will decrease Outcome: Progressing   Problem: Nutrition: Goal: Adequate nutrition will be maintained Outcome: Progressing   Problem: Coping: Goal: Level of anxiety will decrease Outcome: Progressing   Problem: Pain Managment: Goal: General experience of comfort will improve Outcome: Progressing   Problem: Safety: Goal: Ability to remain free from injury will improve Outcome: Progressing   Problem: Skin Integrity: Goal: Risk for impaired skin integrity will decrease Outcome: Progressing   

## 2018-11-19 NOTE — Progress Notes (Signed)
Initial Nutrition Assessment  DOCUMENTATION CODES:   Morbid obesity  INTERVENTION:  Reinforced low Na/Carb mod diet, reviewed alternatives to soda/juices and encouraged snacking before taking medication and having 3 meals a day.    NUTRITION DIAGNOSIS:   Inadequate oral intake related to decreased appetite, chronic illness as evidenced by per patient/family report.   GOAL:   Patient will meet greater than or equal to 90% of their needs  MONITOR:   PO intake  REASON FOR ASSESSMENT:   Consult Assessment of nutrition requirement/status  ASSESSMENT:   PT with PMH of OSA on CPAP, HTN, HLD, morbid obesity with BMI 43, T2DM, COPD with increased O2 needs and chronic diastolic CHF, admitted 1/7 with SOB.   Spoke with patient before discharge. Pt reports that she has been lacking an appetite lately; did not eat all of her breakfast 1/7 (50%) or 1/8. Reports that she has only 1-2 meals a day; medications in the AM causes satiety so encouraged snacking beforehand. Drinks mostly Pepsi and orange juice at home, pt expressed that she does not like plain water; observed that all of the grape juice on her tray was gone and we spoke to her about incorporating Crystal Lite to drink more water w/o increasing BGL. Pt reports that she prepares most meals at home, will eat out occasionally. Foods that are prepared includes chicken salad, baked chicken and green salads. Pt expresses she is tired of chicken and is looking for other options. Fruits include grapes, apples, and bananas.   Pt reported that she is physically active for about 20 min, just not every day. In the process of completing her GED, reports that she will experience SOB walking on campus. Pt mentioned intentional weight loss from following a diet. Discussed with pt about the importance of having 3 full, healthy meals and eating snacks in between to prevent malnutrition.  Meds: SSI with meals, linaclotide, pepcid, Solu-medrol Labs: CBG  382, 305, 304, 290 Lab Results  Component Value Date   HGBA1C 7.9 (H) 11/18/2018    NUTRITION - FOCUSED PHYSICAL EXAM:    Most Recent Value  Orbital Region  No depletion  Upper Arm Region  No depletion  Thoracic and Lumbar Region  No depletion  Buccal Region  No depletion  Temple Region  No depletion  Clavicle Bone Region  No depletion  Clavicle and Acromion Bone Region  No depletion  Scapular Bone Region  No depletion  Dorsal Hand  No depletion  Patellar Region  No depletion  Anterior Thigh Region  No depletion  Posterior Calf Region  No depletion  Edema (RD Assessment)  Unable to assess  Hair  Reviewed  Eyes  Reviewed  Mouth  Reviewed  Skin  Reviewed  Nails  Reviewed       Diet Order:   Diet Order            Diet Carb Modified Fluid consistency: Thin; Room service appropriate? Yes  Diet effective now              EDUCATION NEEDS:   Education needs have been addressed  Skin:  Skin Assessment: Skin Integrity Issues: Skin Integrity Issues:: Stage II Stage II: Buttocks  Last BM:  Unknown  Height:   Ht Readings from Last 1 Encounters:  11/19/18 5' (1.524 m)    Weight:   Wt Readings from Last 1 Encounters:  11/19/18 98.9 kg    Ideal Body Weight:  45.5 kg  BMI:  Body mass index is 42.58 kg/m.  Estimated Nutritional Needs:   Kcal:  1500-1700kcal  Protein:  70-80g  Fluid:  1.5L-1.7L   [employer]

## 2018-11-19 NOTE — Discharge Summary (Addendum)
Physician Discharge Summary  Stephanie Schultz NFA:[id number] DOB: February 23, 1956 DOA: 11/17/2018  PCP: [name], MD  Admit date: 11/17/2018 Discharge date: 11/19/2018  Admitted From: home Disposition:  home  Recommendations for Outpatient Follow-up:  1. Follow up with PCP in 1-2 weeks Follow up with pulmonologist in 6-8 weeks or earlier if scheduled  Home Health: RN Equipment/Devices: O2 (3 L via [place] continuously)  Discharge Condition: Fair CODE STATUS: DNR  Diet: Heart healthy/diabetic     Discharge Diagnoses:  Active Problems:   Acute exacerbation of chronic obstructive pulmonary disease (COPD) ([place])  Active problems   Morbid obesity (HCC)   Acute on chronic respiratory failure with hypoxia (HCC)   Chronic diastolic CHF (congestive heart failure) (HCC)   Controlled type 2 diabetes mellitus with hyperglycemia, with long-term current use of insulin (HCC)  Brief narrative/HPI 63 year old morbidly obese female with history of OSA no longer CPAP (reports having been discontinued by her pulmonologist after sleep study done this year), chronic respiratory failure secondary to COPD on 2.5-3 L home O2, chronic diastolic CHF, controlled type 2 diabetes mellitus, hypertension, hyperlipidemia presented with acute on chronic respiratory failure secondary to COPD with acute exacerbation.  Hospital course Acute on chronic respiratory failure ([place]) COPD with acute exacerbation. Symptoms improved with IV Solu-Medrol, scheduled duo nebs.  Antibiotic was not required patient was having mild to moderate nonproductive cough and remained afebrile. Symptoms are now better and feels almost 80% of her baseline with above-mentioned treatment. Sats stable on 2 L via nasal cannula.  Instructed to continue 3 L for now. PT recommends outpatient pulmonary rehab program however patient reports that she was referred to this before but because of her insurance it would not cover. Patient will be  discharged on oral prednisone taper over the next 12 days.  I would also switch her albuterol neb to DuoNeb.  Patient is clinically stable to be discharged and should follow-up with her PCP in about 2 weeks and with her pulmonologist in about 6-8 weeks.  Uncontrolled type 2 diabetes mellitus with hyperglycemia A1c of 7.9.  Continue home dose Lantus, Glucotrol and Januvia.  CBG may get elevated with prednisone use.  Morbid obesity BMI of 42.5 kg/m Counseled on monitoring her diet and exercise to lose weight.  Chronic diastolic CHF Euvolemic.  Continue Lasix, Aldactone, beta-blocker.  Family communication: None at bedside Disposition: Home  Discharge Instructions   Allergies as of 11/19/2018      Reactions   Acetaminophen Other (See Comments)   Pt was told that she could not take Tylenol, does not know why.      Medication List    TAKE these medications   allopurinol 100 MG tablet Commonly known as:  ZYLOPRIM Take 100 mg by mouth daily.   ALPRAZolam 1 MG tablet Commonly known as:  XANAX Take 1 mg by mouth at bedtime as needed for sleep.   aspirin EC 81 MG tablet Take 81 mg by mouth daily.   clobetasol cream 0.05 % Commonly known as:  TEMOVATE Apply 1 application topically daily as needed (irritation from pull ups).   fluticasone 50 MCG/ACT nasal spray Commonly known as:  FLONASE Place 1 spray into both nostrils daily as needed (congestion).   Fluticasone-Umeclidin-Vilant 100-62.5-25 MCG/INH Aepb Commonly known as:  TRELEGY ELLIPTA Inhale 1 puff into the lungs daily.   furosemide 40 MG tablet Commonly known as:  LASIX Take 1 tablet in the morning and 0.5 tablet in the evening What changed:    how  much to take  how to take this  when to take this   glipiZIDE 10 MG tablet Commonly known as:  GLUCOTROL Take 10 mg by mouth 2 (two) times daily before a meal.   HYDROcodone-acetaminophen 7.5-325 MG tablet Commonly known as:  NORCO Take 1 tablet by mouth every  6 (six) hours as needed for moderate pain.   insulin glargine 100 unit/mL Sopn Commonly known as:  LANTUS Inject 18 Units into the skin at bedtime.   ipratropium-albuterol 0.5-2.5 (3) MG/3ML Soln Commonly known as:  DUONEB Take 3 mLs by nebulization every 6 (six) hours as needed (wheezing, shortness of breath).   LINZESS 145 MCG Caps capsule Generic drug:  linaclotide Take 145 mcg by mouth daily.   loratadine 10 MG tablet Commonly known as:  CLARITIN Take 10 mg by mouth daily.   metoprolol succinate 50 MG 24 hr tablet Commonly known as:  TOPROL-XL Take 1 tablet (50 mg total) by mouth daily. Take with or immediately following a meal.   ondansetron 4 MG disintegrating tablet Commonly known as:  ZOFRAN ODT Take 1 tablet (4 mg total) by mouth every 8 (eight) hours as needed for nausea or vomiting.   OXYGEN Inhale 3 L into the lungs continuous.   Potassium Chloride ER 20 MEQ Tbcr Take 20 mEq by mouth daily.   predniSONE 20 MG tablet Commonly known as:  DELTASONE Take 1 tablet (20 mg total) by mouth daily with breakfast.   PROAIR HFA 108 (90 Base) MCG/ACT inhaler Generic drug:  albuterol INHALE 2 PUFFS BY MOUTH EVERY 4 HOURS AS NEEDED FOR WHEEZING What changed:    See the new instructions.  Another medication with the same name was removed. Continue taking this medication, and follow the directions you see here.   ranitidine 75 MG tablet Commonly known as:  ZANTAC 75 Take 1 tablet (75 mg total) 2 (two) times daily by mouth.   sitaGLIPtin 100 MG tablet Commonly known as:  JANUVIA Take 100 mg by mouth daily.   spironolactone 25 MG tablet Commonly known as:  ALDACTONE Take 0.5 tablets (12.5 mg total) by mouth daily.   SYSTANE ULTRA OP Place 2 drops into both eyes 2 (two) times daily.   VITAMIN D PO Take 1 tablet by mouth daily.   zolpidem 10 MG tablet Commonly known as:  AMBIEN Take 10 mg by mouth at bedtime.      Follow-up Information    [name], MD. Schedule an appointment as soon as possible for a visit in 2 days.   Specialty:  Internal Medicine Why:  for further evaluation of your symptoms. Contact information: [place]. [place] [address] [phone]        Collene Gobble, MD Follow up in 8 week(s).   Specialty:  Pulmonary Disease Why:  or as scheduled Contact information: [place] [place] [address] [phone]          Allergies  Allergen Reactions  . Acetaminophen Other (See Comments)    Pt was told that she could not take Tylenol, does not know why.     Procedures/Studies: Dg Chest 2 View  Result Date: 11/17/2018 CLINICAL DATA:  Shortness of breath EXAM: CHEST - 2 VIEW COMPARISON:  07/18/2018 FINDINGS: Normal heart size and stable mediastinal contours. Chronic interstitial coarsening. There is no edema, consolidation, effusion, or pneumothorax. Remote and healed left rib fractures. Artifact from EKG leads. IMPRESSION: Stable from prior.  No evidence of acute disease.  Electronically Signed   By: Monte Fantasia M.D.   On: 11/17/2018 11:29     Subjective: Feels 80% of baseline with her breathing. maintaining sats on 3L. Has some non productive cough  Discharge Exam: Vitals:   11/19/18 0154 11/19/18 0603  BP: 131/65 (!) 122/57  Pulse: 88 71  Resp: (!) 21 19  Temp: (!) 97.3 F (36.3 C) 98.1 F (36.7 C)  SpO2: 95% 99%   Vitals:   11/18/18 1434 11/18/18 1955 11/19/18 0154 11/19/18 0603  BP: 115/69  131/65 (!) 122/57  Pulse: 87  88 71  Resp: 18  (!) 21 19  Temp: 97.6 F (36.4 C)  (!) 97.3 F (36.3 C) 98.1 F (36.7 C)  TempSrc: Oral  Oral Oral  SpO2: 92% 93% 95% 99%  Weight:   98.9 kg   Height:   5' (1.524 m)     General: NAD, morbidly obese Chest, few scattered rhnchi Cardiovascular: NS1&S2, no murmurs GI: Soft, NT, ND, bowel sounds + musculoskeletal: no edema    The results of significant diagnostics from this hospitalization (including imaging,  microbiology, ancillary and laboratory) are listed below for reference.     Microbiology: No results found for this or any previous visit (from the past 240 hour(s)).   Labs: BNP (last 3 results) Recent Labs    11/19/17 1258 12/01/17 1151  BNP 9.4 67.3   Basic Metabolic Panel: Recent Labs  Lab 11/17/18 1016  NA 139  K 4.0  CL 101  CO2 27  GLUCOSE 216*  BUN 10  CREATININE 1.02*  CALCIUM 9.6   Liver Function Tests: Recent Labs  Lab 11/17/18 1016  AST 26  ALT 38  ALKPHOS 37*  BILITOT 0.5  PROT 7.6  ALBUMIN 3.8   No results for input(s): LIPASE, AMYLASE in the last 168 hours. No results for input(s): AMMONIA in the last 168 hours. CBC: Recent Labs  Lab 11/17/18 1016  WBC 7.8  NEUTROABS 5.5  HGB 12.2  HCT 37.3  MCV 84.6  PLT 241   Cardiac Enzymes: No results for input(s): CKTOTAL, CKMB, CKMBINDEX, TROPONINI in the last 168 hours. BNP: Invalid input(s): POCBNP CBG: Recent Labs  Lab 11/18/18 0749 11/18/18 1151 11/18/18 1656 11/18/18 2109 11/19/18 0823  GLUCAP 324* 290* 304* 305* 382*   D-Dimer No results for input(s): DDIMER in the last 72 hours. Hgb A1c Recent Labs    11/18/18 0517  HGBA1C 7.9*   Lipid Profile No results for input(s): CHOL, HDL, LDLCALC, TRIG, CHOLHDL, LDLDIRECT in the last 72 hours. Thyroid function studies No results for input(s): TSH, T4TOTAL, T3FREE, THYROIDAB in the last 72 hours.  Invalid input(s): FREET3 Anemia work up No results for input(s): VITAMINB12, FOLATE, FERRITIN, TIBC, IRON, RETICCTPCT in the last 72 hours. Urinalysis    Component Value Date/Time   COLORURINE YELLOW 10/13/2017 1939   APPEARANCEUR CLEAR 10/13/2017 1939   LABSPEC 1.029 10/13/2017 1939   PHURINE 5.0 10/13/2017 1939   GLUCOSEU NEGATIVE 10/13/2017 1939   HGBUR NEGATIVE 10/13/2017 1939   BILIRUBINUR NEGATIVE 10/13/2017 1939   KETONESUR NEGATIVE 10/13/2017 1939   PROTEINUR 30 (A) 10/13/2017 1939   UROBILINOGEN 0.2 03/31/2015 1231    NITRITE NEGATIVE 10/13/2017 1939   LEUKOCYTESUR NEGATIVE 10/13/2017 1939   Sepsis Labs Invalid input(s): PROCALCITONIN,  WBC,  LACTICIDVEN Microbiology No results found for this or any previous visit (from the past 240 hour(s)).   Time coordinating discharge: 35 minutes  SIGNED:   Louellen Molder, MD  [facility] 11/19/2018, 9:48  AM Pager   If 7PM-7AM, please contact night-coverage [email or web address] Password [email or web address]

## 2018-11-19 NOTE — Progress Notes (Signed)
Contacted Dr. Clementeen Graham about pt request for follow up assessment by [facility] once DC'd.  Orders received.

## 2018-11-24 ENCOUNTER — Telehealth: Payer: Self-pay | Admitting: Cardiology

## 2018-11-24 NOTE — Telephone Encounter (Signed)
Spoke with the patient, she has had worsening SOB and left leg swelling. She stated that her left leg feels compressed due to the swelling. She stated that occasionally she feels pain during ambulation and slight numbness. She stated that her SOB was worse today, but not as bad as the day she went to the hospital on 1/6. Spoke with Lyda Jester PA-C, she stated the patient should increase Lasix to 40 mg, BID and Potassium to 20 meq, BID for 2 days. She also requested if Home health would be able to draw a BMET and ProBNP. Spoke with Amy from [facility] and she accepted to getting labs on Wednesday. The patient expressed understanding and had no further questions.

## 2018-11-24 NOTE — Telephone Encounter (Signed)
Pt c/o swelling: STAT is pt has developed SOB within 24 hours  1) How much weight have you gained and in what time span? 3.6 - 5 days  2) If swelling, where is the swelling located? Legs, ankles and belly  3) Are you currently taking a fluid pill?         furosemide (LASIX) 40 MG tablet        40mg  in morning           20 mg in afternoon        spironolactone (ALDACTONE) 25 MG  tablet 1/2 every morning  Potassium Chloride ER 20 MEQ TBCR  4) Are you currently SOB? yes  5) Do you have a log of your daily weights (if so, list)? no  6) Have you gained 3 pounds in a day or 5 pounds in a week? no  7) Have you traveled recently? no   Wt gain since last week. She feels tight in her ABD, swelling in her legs, and SOB. Amy wants to know if we can increase in her fluid pills.   Her weight was on 1/9 220lbs and on 1/13 223.6, her Girth 2.5 cm around her belly, and she was more SOB today.   Amy from [facility] can be reached at [phone].

## 2018-11-27 ENCOUNTER — Telehealth: Payer: Self-pay

## 2018-11-27 DIAGNOSIS — Z79899 Other long term (current) drug therapy: Secondary | ICD-10-CM

## 2018-11-27 DIAGNOSIS — I10 Essential (primary) hypertension: Secondary | ICD-10-CM

## 2018-11-27 NOTE — Telephone Encounter (Signed)
Amy with Home Health called to give an update about patient. Patient's lasix was increase for several days to help with her 3.6 lbs weight gain, leg swelling, and SOB. Patient's weight today was 222 lbs, her baseline is 220 lbs. Patient stated that she felt better on the increased dose of lasix during the 2 days. Patient stated after she went back to normal dose of lasix she feels fine, but not as good as during those two days. Will forward to Dr. Radford Pax and her nurse for advisement.

## 2018-11-27 NOTE — Telephone Encounter (Signed)
Please verify what dose diuretic patient is currently taking

## 2018-11-28 NOTE — Telephone Encounter (Signed)
The patient is taking 40 mg qam and 20 mg qpm.

## 2018-11-29 NOTE — Telephone Encounter (Signed)
Increase Lasix to 40mg  BID and check BMEt in 1 week

## 2018-12-01 MED ORDER — FUROSEMIDE 40 MG PO TABS: 40.0000 mg | ORAL_TABLET | Freq: Two times a day (BID) | ORAL | 3 refills | Status: DC

## 2018-12-01 MED ORDER — POTASSIUM CHLORIDE CRYS ER 20 MEQ PO TBCR: 20.0000 meq | EXTENDED_RELEASE_TABLET | Freq: Two times a day (BID) | ORAL | 3 refills | Status: DC

## 2018-12-01 NOTE — Telephone Encounter (Signed)
Spoke the patient, she wanted to know if we would increase her potassium as well due to her leg cramps she gets. Dr. Radford Pax increased her potassium to 20 meq, twice a day as well. She is scheduled for BMET on 1/27. She had no further questions.

## 2018-12-03 ENCOUNTER — Encounter: Payer: Self-pay | Admitting: Nurse Practitioner

## 2018-12-03 ENCOUNTER — Ambulatory Visit (INDEPENDENT_AMBULATORY_CARE_PROVIDER_SITE_OTHER): Payer: Medicaid Other | Admitting: Nurse Practitioner

## 2018-12-03 VITALS — BP 112/60 | HR 82 | Ht 60.0 in | Wt 223.0 lb

## 2018-12-03 DIAGNOSIS — J449 Chronic obstructive pulmonary disease, unspecified: Secondary | ICD-10-CM

## 2018-12-03 DIAGNOSIS — J9611 Chronic respiratory failure with hypoxia: Secondary | ICD-10-CM | POA: Diagnosis not present

## 2018-12-03 MED ORDER — FLUTTER DEVI: 1.0000 | Freq: Three times a day (TID) | 0 refills | Status: AC

## 2018-12-03 NOTE — Assessment & Plan Note (Addendum)
She has recovered well and is stable.  Patient Instructions  Glad that you are doing well Home health nursing is coming out this week and hopefully start home PT Use flutter valve 3 times daily Continue Trelegy Continue proair as needed Continue O2 at 2.5 L at rest and 3 L with exertion Follow up with [name] as scheduled or sooner if needed

## 2018-12-03 NOTE — Progress Notes (Signed)
@Patient  ID: Stephanie Schultz, female    DOB: 09/14/1956, 63 y.o.   MRN: [id number]  Chief Complaint  Patient presents with  . 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.  Tests:  CXR 11/17/18>> no evidence of acute disease.  OV 12/03/18 - hospital follow up 11/17/18 - 11/19/18 Patient presents for hospital follow-up.  Admitted to the hospital on 162 acute on chronic respiratory failure COPD exacerbation.  He was treated with IV Solu-Medrol and scheduled duo nebs.  She was discharged home on oral prednisone taper and switch from albuterol nebs to DuoNeb.  She states that since hospital discharge she has been doing well.  She has home health nursing coming out to check on her and states that she hopes they will have home health PT come out to work with her within the next couple of weeks.  States that she is back to her baseline.  On continuous O2 at 2.5 to 3 L.  She denies any recent fevers.  Denies any chest pain, shortness of breath, edema.  She is compliant with Trelegy and DuoNebs.  Her vital signs are stable in the office today.    Allergies  Allergen Reactions  . Acetaminophen Other (See Comments)    Pt was told that she could not take Tylenol, does not know why.    Immunization History  Administered Date(s) Administered  . Influenza Split 09/20/2011, 09/12/2012, 08/12/2013  . Influenza,inj,Quad PF,6+ Mos 08/03/2014, 08/12/2015, 08/12/2017, 07/29/2018  . Pneumococcal Conjugate-13 09/24/2017  . Pneumococcal Polysaccharide-23 11/12/2009    Past Medical History:  Diagnosis Date  . Anemia    chronis  . Anxiety   . Arthritis   . Cervical cancer ([place]) 1990   cervical   . Chronic diastolic CHF (congestive heart failure) ([place]) 08/10/2016  . COPD  (chronic obstructive pulmonary disease) ([place])    O2 dependent. Pulmo: [name]  . Diabetes mellitus without complication ([place])    a. A1c 8.3 in 11/2015  . Emphysema   . Gallstones    s/p cholecystectomy  . GERD (gastroesophageal reflux disease)   . Gout   . History of home oxygen therapy    "2.5L; 24/7" (08/10/2016)  . Hx of cardiac catheterization    a. LHC at [facility] in [place], [place] 09/2008:  Normal coronary arteries EF 70%. b. LHC 11/2016: normal cors, normal LVEDP, EF 55-65%.  . Hyperlipidemia   . Hypertension   . Hypoxia 11/17/2018  . Morbid obesity ([place])   . OSA on CPAP    CPAP at night   . Pneumonia   . Sickle cell trait ([place])   . Tobacco abuse    a. up to 3ppd from age 22 to 59, now 1/4 ppd (01/2013) >> Quit 10/2015    Tobacco History: Social History   Tobacco Use  Smoking Status Former Smoker  . Packs/day: 0.50  . Years: 36.00  . Pack years: 18.00  . Types: Cigarettes  . Last attempt to quit: 11/11/2015  . Years since quitting: 3.0  Smokeless Tobacco Never Used  Tobacco Comment   Approx 90 pk-yrs (up to 3ppd until ~ 2009). Smoking 3 cigs per day now.   Counseling given: Not Answered Comment: Approx 90 pk-yrs (up to 3ppd until ~ 2009). Smoking 3 cigs per day now.   Outpatient Encounter  Medications as of 12/03/2018  Medication Sig  . allopurinol (ZYLOPRIM) 100 MG tablet Take 100 mg by mouth daily.  Marland Kitchen ALPRAZolam (XANAX) 1 MG tablet Take 1 mg by mouth at bedtime as needed for sleep.   Marland Kitchen aspirin EC 81 MG tablet Take 81 mg by mouth daily.  . Cholecalciferol (VITAMIN D PO) Take 1 tablet by mouth daily.  . clobetasol cream (TEMOVATE) 1.61 % Apply 1 application topically daily as needed (irritation from pull ups).   . fluticasone (FLONASE) 50 MCG/ACT nasal spray Place 1 spray into both nostrils daily as needed (congestion).  . Fluticasone-Umeclidin-Vilant (TRELEGY ELLIPTA) 100-62.5-25 MCG/INH AEPB Inhale 1 puff into the lungs daily.  . furosemide (LASIX) 40 MG tablet  Take 1 tablet (40 mg total) by mouth 2 (two) times daily.  Marland Kitchen glipiZIDE (GLUCOTROL) 10 MG tablet Take 10 mg by mouth 2 (two) times daily before a meal.   . HYDROcodone-acetaminophen (NORCO) 7.5-325 MG tablet Take 1 tablet by mouth every 6 (six) hours as needed for moderate pain.  Marland Kitchen insulin glargine (LANTUS) 100 unit/mL SOPN Inject 18 Units into the skin at bedtime.   Marland Kitchen ipratropium-albuterol (DUONEB) 0.5-2.5 (3) MG/3ML SOLN Take 3 mLs by nebulization every 6 (six) hours as needed (wheezing, shortness of breath).  . Linaclotide (LINZESS) 145 MCG CAPS capsule Take 145 mcg by mouth daily.   Marland Kitchen loratadine (CLARITIN) 10 MG tablet Take 10 mg by mouth daily.  . metoprolol succinate (TOPROL-XL) 50 MG 24 hr tablet Take 1 tablet (50 mg total) by mouth daily. Take with or immediately following a meal.  . ondansetron (ZOFRAN ODT) 4 MG disintegrating tablet Take 1 tablet (4 mg total) by mouth every 8 (eight) hours as needed for nausea or vomiting.  . OXYGEN Inhale 2.5 L into the lungs continuous.   Vladimir Faster Glycol-Propyl Glycol (SYSTANE ULTRA OP) Place 2 drops into both eyes 2 (two) times daily.  . Potassium Chloride ER 20 MEQ TBCR Take 20 mEq by mouth daily.  . potassium chloride SA (K-DUR,KLOR-CON) 20 MEQ tablet Take 1 tablet (20 mEq total) by mouth 2 (two) times daily.  . predniSONE (DELTASONE) 20 MG tablet Take 1 tablet (20 mg total) by mouth daily with breakfast.  . PROAIR HFA 108 (90 Base) MCG/ACT inhaler INHALE 2 PUFFS BY MOUTH EVERY 4 HOURS AS NEEDED FOR WHEEZING (Patient taking differently: Inhale 2 puffs into the lungs every 4 (four) hours as needed for wheezing or shortness of breath. )  . ranitidine (ZANTAC 75) 75 MG tablet Take 1 tablet (75 mg total) 2 (two) times daily by mouth.  . sitaGLIPtin (JANUVIA) 100 MG tablet Take 100 mg by mouth daily.  Marland Kitchen spironolactone (ALDACTONE) 25 MG tablet Take 0.5 tablets (12.5 mg total) by mouth daily.  Marland Kitchen zolpidem (AMBIEN) 10 MG tablet Take 10 mg by mouth at  bedtime.  Marland Kitchen Respiratory Therapy Supplies (FLUTTER) DEVI 1 Device by Does not apply route 3 (three) times daily.   No facility-administered encounter medications on file as of 12/03/2018.      Review of Systems  Review of Systems  Constitutional: Negative.  Negative for chills and fever.  HENT: Negative.   Respiratory: Negative for cough, shortness of breath and wheezing.   Cardiovascular: Negative.  Negative for chest pain, palpitations and leg swelling.  Gastrointestinal: Negative.   Allergic/Immunologic: Negative.   Neurological: Negative.   Psychiatric/Behavioral: Negative.        Physical Exam  BP 112/60 (BP Location: Left Arm, Patient Position: Sitting, Cuff Size:  Normal)   Pulse 82   Ht 5' (1.524 m)   Wt 223 lb (101.2 kg)   LMP 06/04/2000 (LMP Unknown) Comment: tubal ligation  SpO2 96%   BMI 43.55 kg/m   Wt Readings from Last 5 Encounters:  12/03/18 223 lb (101.2 kg)  11/19/18 218 lb (98.9 kg)  10/14/18 223 lb (101.2 kg)  09/22/18 222 lb (100.7 kg)  07/29/18 224 lb (101.6 kg)     Physical Exam Vitals signs and nursing note reviewed.  Constitutional:      General: She is not in acute distress.    Appearance: She is well-developed.  Cardiovascular:     Rate and Rhythm: Normal rate and regular rhythm.  Pulmonary:     Effort: Pulmonary effort is normal.     Breath sounds: Normal breath sounds.  Musculoskeletal:        General: No swelling.  Neurological:     Mental Status: She is alert and oriented to person, place, and time.       Imaging: Dg Chest 2 View  Result Date: 11/17/2018 CLINICAL DATA:  Shortness of breath EXAM: CHEST - 2 VIEW COMPARISON:  07/18/2018 FINDINGS: Normal heart size and stable mediastinal contours. Chronic interstitial coarsening. There is no edema, consolidation, effusion, or pneumothorax. Remote and healed left rib fractures. Artifact from EKG leads. IMPRESSION: Stable from prior.  No evidence of acute disease. Electronically  Signed   By: Monte Fantasia M.D.   On: 11/17/2018 11:29     Assessment & Plan:   COPD (chronic obstructive pulmonary disease) ([place]) She has recovered well and is stable.  Patient Instructions  Glad that you are doing well Home health nursing is coming out this week and hopefully start home PT Use flutter valve 3 times daily Continue Trelegy Continue proair as needed Continue O2 at 2.5 L at rest and 3 L with exertion Follow up with [name] as scheduled or sooner if needed     Chronic respiratory failure with hypoxia (HCC) Continue O2 continuous at 2.5 L at rest and 3 L with exertion.     Fenton Foy, NP 12/03/2018

## 2018-12-03 NOTE — Patient Instructions (Addendum)
Glad that you are doing well Home health nursing is coming out this week and hopefully start home PT Use flutter valve 3 times daily Continue Trelegy Continue proair as needed Continue O2 at 2.5 L at rest and 3 L with exertion Follow up with [name] as scheduled or sooner if needed

## 2018-12-03 NOTE — Assessment & Plan Note (Addendum)
Continue O2 continuous at 2.5 L at rest and 3 L with exertion.

## 2018-12-08 ENCOUNTER — Other Ambulatory Visit: Payer: Medicaid Other

## 2018-12-10 ENCOUNTER — Ambulatory Visit: Payer: Medicaid Other | Admitting: Podiatry

## 2018-12-11 ENCOUNTER — Other Ambulatory Visit: Payer: Medicaid Other | Admitting: *Deleted

## 2018-12-11 DIAGNOSIS — Z79899 Other long term (current) drug therapy: Secondary | ICD-10-CM

## 2018-12-11 DIAGNOSIS — I10 Essential (primary) hypertension: Secondary | ICD-10-CM

## 2018-12-11 LAB — BASIC METABOLIC PANEL
BUN/Creatinine Ratio: 11 — ABNORMAL LOW (ref 12–28)
BUN: 13 mg/dL (ref 8–27)
CO2: 25 mmol/L (ref 20–29)
Calcium: 9.6 mg/dL (ref 8.7–10.3)
Chloride: 96 mmol/L (ref 96–106)
Creatinine, Ser: 1.2 mg/dL — ABNORMAL HIGH (ref 0.57–1.00)
GFR, EST AFRICAN AMERICAN: 57 mL/min/{1.73_m2} — AB (ref >59)
GFR, EST NON AFRICAN AMERICAN: 49 mL/min/{1.73_m2} — AB (ref >59)
Glucose: 161 mg/dL — ABNORMAL HIGH (ref 65–99)
Potassium: 3.9 mmol/L (ref 3.5–5.2)
Sodium: 141 mmol/L (ref 134–144)

## 2018-12-17 NOTE — Progress Notes (Signed)
@Patient  ID: Stephanie Schultz, female    DOB: Dec 26, 1955, 63 y.o.   MRN: [id number]  Chief Complaint  Patient presents with  . Acute Visit    sats dropping x2 weeks, dry cough with occasional white mucus, infrequent chest tightness    Referring provider: [name], MD  HPI:  63 year old female former smoker followed in our office for COPD with an asthmatic component, upper airway irritation syndrome, chronic cough, and chronic respiratory failure  PMH: Obesity, hypertension, diastolic dysfunction Smoker/ Smoking History: Former smoker.  Quit 2016.  90 pack year smoker.  Maintenance: Trelegy Ellipta Pt of: [name]  12/18/2018  - Visit   62 year old female former smoker presenting to our office today for an acute visit.  Patient reporting that she is having oxygen levels do drop at home.  Patient reports that this does not happen consistently but only happens occasionally.  Patient reporting that when her oxygen levels do drop between 70 to 83% but typically around 83%.  Patient reports that she has not been having any acute symptoms other than an occasional dry cough.  No new medication changes except for an additional fluid pill and potassium which is managed by cardiology.  Patient reports she is been using her rescue inhaler maybe about 1 time a week.  Patient's been adherent to her Trelegy Ellipta inhaler.  Patient is unsure why her oxygen levels are dropping some.  Unfortunately patient could not afford pulmonary rehab which she was previously referred to.  Patient has started physical therapy and she has hopes that her insurance will cover this.  She is completed 1 physical therapy appointment so far.  MMRC - Breathlessness Score 3 - I stop for breath after walking about 100 yards or after a few minutes on level ground (isle at grocery store is 130ft)   Tests:  11/17/2018-chest x-ray- no evidence of acute disease  08/23/2017-echocardiogram-LV ejection fraction 60 to  65%  11/16/2013-pulmonary function test- FVC 1.58 (67% predicted), postbronchodilator ratio 60, postbronchodilator FEV1 1.00 (53% predicted), no significant bronchodilator response, mid flow reversibility after administration of bronchodilator, DLCO 39  FENO:  No results found for: NITRICOXIDE  PFT: PFT Results Latest Ref Rng & Units 11/16/2013  FVC-Pre L 1.58  FVC-Predicted Pre % 67  FVC-Post L 1.66  FVC-Predicted Post % 70  Pre FEV1/FVC % % 59  Post FEV1/FCV % % 60  FEV1-Pre L 0.94  FEV1-Predicted Pre % 50  FEV1-Post L 1.00  DLCO UNC% % 39  DLCO COR %Predicted % 61  TLC L 3.60  TLC % Predicted % 80  RV % Predicted % 109    Imaging: No results found.    Specialty Problems      Pulmonary Problems   Chronic allergic rhinitis   COPD (chronic obstructive pulmonary disease) (HCC)    11/16/2013-pulmonary function test- FVC 1.58 (67% predicted), postbronchodilator ratio 60, postbronchodilator FEV1 1.00 (53% predicted), no significant bronchodilator response, mid flow reversibility after administration of bronchodilator, DLCO 39      Pulmonary nodule    Per pt' s report, CXR from [name] showed " a spot"      Epistaxis, recurrent   Chronic respiratory failure with hypoxia ([place])   Dyspnea   Acute exacerbation of chronic obstructive pulmonary disease (COPD) ([place])      Allergies  Allergen Reactions  . Acetaminophen Other (See Comments)    Pt was told that she could not take Tylenol, does not know why.    Immunization History  Administered Date(s) Administered  . Influenza Split 09/20/2011, 09/12/2012, 08/12/2013  . Influenza,inj,Quad PF,6+ Mos 08/03/2014, 08/12/2015, 08/12/2017, 07/29/2018  . Pneumococcal Conjugate-13 09/24/2017  . Pneumococcal Polysaccharide-23 11/12/2009    Past Medical History:  Diagnosis Date  . Anemia    chronis  . Anxiety   . Arthritis   . Cervical cancer ([place]) 1990   cervical   . Chronic diastolic CHF (congestive heart failure) ([place])  08/10/2016  . COPD (chronic obstructive pulmonary disease) ([place])    O2 dependent. Pulmo: [name]  . Diabetes mellitus without complication ([place])    a. A1c 8.3 in 11/2015  . Emphysema   . Gallstones    s/p cholecystectomy  . GERD (gastroesophageal reflux disease)   . Gout   . History of home oxygen therapy    "2.5L; 24/7" (08/10/2016)  . Hx of cardiac catheterization    a. LHC at [facility] in [place], [place] 09/2008:  Normal coronary arteries EF 70%. b. LHC 11/2016: normal cors, normal LVEDP, EF 55-65%.  . Hyperlipidemia   . Hypertension   . Hypoxia 11/17/2018  . Morbid obesity ([place])   . OSA on CPAP    CPAP at night   . Pneumonia   . Sickle cell trait ([place])   . Tobacco abuse    a. up to 3ppd from age 37 to 22, now 1/4 ppd (01/2013) >> Quit 10/2015    Tobacco History: Social History   Tobacco Use  Smoking Status Former Smoker  . Packs/day: 3.00  . Years: 36.00  . Pack years: 108.00  . Types: Cigarettes  . Last attempt to quit: 11/11/2015  . Years since quitting: 3.1  Smokeless Tobacco Never Used  Tobacco Comment   Approx 90 pk-yrs (up to 3ppd until ~ 2009). Smoking 3 cigs per day now.   Counseling given: Yes Comment: Approx 90 pk-yrs (up to 3ppd until ~ 2009). Smoking 3 cigs per day now.  Continue to not smoke  Outpatient Encounter Medications as of 12/18/2018  Medication Sig  . allopurinol (ZYLOPRIM) 100 MG tablet Take 100 mg by mouth daily.  Marland Kitchen ALPRAZolam (XANAX) 1 MG tablet Take 1 mg by mouth at bedtime as needed for sleep.   Marland Kitchen aspirin EC 81 MG tablet Take 81 mg by mouth daily.  . Cholecalciferol (VITAMIN D PO) Take 1 tablet by mouth daily.  . clobetasol cream (TEMOVATE) 9.89 % Apply 1 application topically daily as needed (irritation from pull ups).   . esomeprazole (NEXIUM) 40 MG capsule Take 40 mg by mouth daily at 12 noon.  . fluticasone (FLONASE) 50 MCG/ACT nasal spray Place 1 spray into both nostrils daily as needed (congestion).  . Fluticasone-Umeclidin-Vilant  (TRELEGY ELLIPTA) 100-62.5-25 MCG/INH AEPB Inhale 1 puff into the lungs daily.  . furosemide (LASIX) 40 MG tablet Take 1 tablet (40 mg total) by mouth 2 (two) times daily.  Marland Kitchen glipiZIDE (GLUCOTROL) 10 MG tablet Take 10 mg by mouth 2 (two) times daily before a meal.   . HYDROcodone-acetaminophen (NORCO) 7.5-325 MG tablet Take 1 tablet by mouth every 6 (six) hours as needed for moderate pain.  Marland Kitchen insulin glargine (LANTUS) 100 unit/mL SOPN Inject 18 Units into the skin at bedtime.   Marland Kitchen ipratropium-albuterol (DUONEB) 0.5-2.5 (3) MG/3ML SOLN Take 3 mLs by nebulization every 6 (six) hours as needed (wheezing, shortness of breath).  . Linaclotide (LINZESS) 145 MCG CAPS capsule Take 145 mcg by mouth daily.   Marland Kitchen loratadine (CLARITIN) 10 MG tablet Take 10 mg by mouth daily.  Marland Kitchen  metoprolol succinate (TOPROL-XL) 50 MG 24 hr tablet Take 1 tablet (50 mg total) by mouth daily. Take with or immediately following a meal.  . ondansetron (ZOFRAN ODT) 4 MG disintegrating tablet Take 1 tablet (4 mg total) by mouth every 8 (eight) hours as needed for nausea or vomiting.  . OXYGEN Inhale 2.5 L into the lungs continuous.   Vladimir Faster Glycol-Propyl Glycol (SYSTANE ULTRA OP) Place 2 drops into both eyes 2 (two) times daily.  . Potassium Chloride ER 20 MEQ TBCR Take 20 mEq by mouth daily.  . potassium chloride SA (K-DUR,KLOR-CON) 20 MEQ tablet Take 1 tablet (20 mEq total) by mouth 2 (two) times daily.  . predniSONE (DELTASONE) 20 MG tablet Take 1 tablet (20 mg total) by mouth daily with breakfast.  . PROAIR HFA 108 (90 Base) MCG/ACT inhaler INHALE 2 PUFFS BY MOUTH EVERY 4 HOURS AS NEEDED FOR WHEEZING (Patient taking differently: Inhale 2 puffs into the lungs every 4 (four) hours as needed for wheezing or shortness of breath. )  . Respiratory Therapy Supplies (FLUTTER) DEVI 1 Device by Does not apply route 3 (three) times daily.  . sitaGLIPtin (JANUVIA) 100 MG tablet Take 100 mg by mouth daily.  Marland Kitchen spironolactone (ALDACTONE) 25 MG  tablet Take 0.5 tablets (12.5 mg total) by mouth daily.  Marland Kitchen zolpidem (AMBIEN) 10 MG tablet Take 10 mg by mouth at bedtime.  . [DISCONTINUED] ranitidine (ZANTAC 75) 75 MG tablet Take 1 tablet (75 mg total) 2 (two) times daily by mouth.   No facility-administered encounter medications on file as of 12/18/2018.      Review of Systems  Review of Systems  Constitutional: Positive for fatigue. Negative for fever.  HENT: Positive for congestion. Negative for sinus pressure and sinus pain.   Respiratory: Positive for cough (dry cough ) and shortness of breath. Negative for wheezing.   Cardiovascular: Negative for chest pain and palpitations.  Gastrointestinal: Negative for diarrhea, nausea and vomiting.     Physical Exam  BP 100/60 (BP Location: Left Arm, Cuff Size: Normal)   Pulse 95   Ht 5' (1.524 m)   Wt 225 lb 12.8 oz (102.4 kg)   LMP 06/04/2000 (LMP Unknown) Comment: tubal ligation  SpO2 90%   BMI 44.10 kg/m   Wt Readings from Last 5 Encounters:  12/18/18 225 lb 12.8 oz (102.4 kg)  12/03/18 223 lb (101.2 kg)  11/19/18 218 lb (98.9 kg)  10/14/18 223 lb (101.2 kg)  09/22/18 222 lb (100.7 kg)   2.5L via nasal cannula  Physical Exam  Constitutional: She is oriented to person, place, and time and well-developed, well-nourished, and in no distress. No distress.  HENT:  Head: Normocephalic and atraumatic.  Right Ear: Hearing, tympanic membrane, external ear and ear canal normal.  Left Ear: Hearing, tympanic membrane, external ear and ear canal normal.  Nose: Mucosal edema present. Right sinus exhibits no maxillary sinus tenderness and no frontal sinus tenderness. Left sinus exhibits no maxillary sinus tenderness and no frontal sinus tenderness.  Mouth/Throat: Uvula is midline and oropharynx is clear and moist. No oropharyngeal exudate.  Eyes: Pupils are equal, round, and reactive to light.  Neck: Normal range of motion. Neck supple.  Cardiovascular: Normal rate, regular rhythm and  normal heart sounds.  Distant heart tones  Pulmonary/Chest: Effort normal and breath sounds normal. No accessory muscle usage. No respiratory distress. She has no decreased breath sounds. She has no wheezes. She has no rhonchi.  Distant breath sounds  Musculoskeletal: Normal range of  motion.        General: No edema.  Lymphadenopathy:    She has no cervical adenopathy.  Neurological: She is alert and oriented to person, place, and time. Gait normal.  Skin: Skin is warm and dry. She is not diaphoretic. No erythema.  Psychiatric: Mood, memory, affect and judgment normal.  Nursing note and vitals reviewed.   Walk today in office patient was able to complete 1 lap requiring 4 L of O2 with exertion  Lab Results:  CBC    Component Value Date/Time   WBC 7.8 11/17/2018 1016   RBC 4.41 11/17/2018 1016   HGB 12.2 11/17/2018 1016   HGB 11.8 11/30/2016 1301   HCT 37.3 11/17/2018 1016   HCT 34.5 11/30/2016 1301   PLT 241 11/17/2018 1016   PLT 254 11/30/2016 1301   MCV 84.6 11/17/2018 1016   MCV 82 11/30/2016 1301   MCH 27.7 11/17/2018 1016   MCHC 32.7 11/17/2018 1016   RDW 14.5 11/17/2018 1016   RDW 15.5 (H) 11/30/2016 1301   LYMPHSABS 1.6 11/17/2018 1016   MONOABS 0.5 11/17/2018 1016   EOSABS 0.1 11/17/2018 1016   BASOSABS 0.0 11/17/2018 1016    BMET    Component Value Date/Time   NA 141 12/11/2018 1005   K 3.9 12/11/2018 1005   CL 96 12/11/2018 1005   CO2 25 12/11/2018 1005   GLUCOSE 161 (H) 12/11/2018 1005   GLUCOSE 216 (H) 11/17/2018 1016   BUN 13 12/11/2018 1005   CREATININE 1.20 (H) 12/11/2018 1005   CALCIUM 9.6 12/11/2018 1005   GFRNONAA 49 (L) 12/11/2018 1005   GFRAA 57 (L) 12/11/2018 1005    BNP    Component Value Date/Time   BNP 12.9 12/01/2017 1151    ProBNP    Component Value Date/Time   PROBNP 16.1 07/05/2014 1230      Assessment & Plan:     COPD (chronic obstructive pulmonary disease) ([place]) Assessment: COPD Gold 2 - 2015 PFT DLCO 39 on  2015 PFT Managed well on Trelegy Ellipta BMI 44.1 Lungs clear to auscultation today, diminished breath sounds   Plan: We will order new PFT Continue Trelegy Ellipta Encourage patient to use rescue inhaler every 6 hours as needed for shortness of breath and wheezing Continue oxygen therapy as prescribed Continue flutter valve and incentive spirometer use Continue working with physical therapy Follow-up with [name]   Chronic respiratory failure with hypoxia [facility]) Assessment: Patient reporting occasional drops in oxygen at home Oxygen saturations 88 to 90% on 2-1/2 L today in office Walk today in office -patient required 4 L with exertion  Plan: Continue oxygen therapy as prescribed >>> 4 L with exertion and 2-1/2 L at rest  Morbid obesity ([place]) Assessment: BMI 44.1 Patient recently started physical therapy Unfortunately patient cannot afford pulmonary rehab  Plan: Continue physical therapy Could consider referral to medical weight management in the future   Chronic allergic rhinitis Assessment: Mild AR flare  Plan: Continue Claritin Continue fluticasone nasal spray Start doing nasal saline rinses     Lauraine Rinne, NP 12/18/2018   This appointment was 35 min long with over 50% of the time in direct face-to-face patient care, assessment, plan of care, and follow-up.

## 2018-12-18 ENCOUNTER — Encounter: Payer: Self-pay | Admitting: Pulmonary Disease

## 2018-12-18 ENCOUNTER — Ambulatory Visit: Payer: Medicaid Other | Admitting: Pulmonary Disease

## 2018-12-18 VITALS — BP 100/60 | HR 95 | Ht 60.0 in | Wt 225.8 lb

## 2018-12-18 DIAGNOSIS — J309 Allergic rhinitis, unspecified: Secondary | ICD-10-CM | POA: Diagnosis not present

## 2018-12-18 DIAGNOSIS — J9611 Chronic respiratory failure with hypoxia: Secondary | ICD-10-CM | POA: Diagnosis not present

## 2018-12-18 DIAGNOSIS — J449 Chronic obstructive pulmonary disease, unspecified: Secondary | ICD-10-CM

## 2018-12-18 NOTE — Assessment & Plan Note (Signed)
Assessment: BMI 44.1 Patient recently started physical therapy Unfortunately patient cannot afford pulmonary rehab  Plan: Continue physical therapy Could consider referral to medical weight management in the future

## 2018-12-18 NOTE — Patient Instructions (Addendum)
Trelegy Ellipta  >>> 1 puff daily in the morning >>>rinse mouth out after use  >>> This inhaler contains 3 medications that help manage her respiratory status, contact our office if you cannot afford this medication or unable to remain on this medication  Only use your albuterol as a rescue medication to be used if you can't catch your breath by resting or doing a relaxed purse lip breathing pattern.  - The less you use it, the better it will work when you need it. - Ok to use up to 2 puffs  every 4 hours if you must but call for immediate appointment if use goes up over your usual need - Don't leave home without it !!  (think of it like the spare tire for your car)   Continue oxygen therapy as prescribed  - 2L with rest and 4L with exertion  >>>maintain oxygen saturations greater than 88 percent  >>>if unable to maintain oxygen saturations please contact the office  >>>do not smoke with oxygen  >>>can use nasal saline gel or nasal saline rinses to moisturize nose if oxygen causes dryness  Continue doing Claritin daily Continue doing fluticasone nasal spray Can start doing nasal saline rinses  Pulmonary Function Test ordered  Keep scheduled follow up with [name]   It is flu season:   >>>Remember to be washing your hands regularly, using hand sanitizer, be careful to use around herself with has contact with people who are sick will increase her chances of getting sick yourself. >>> Best ways to protect herself from the flu: Receive the yearly flu vaccine, practice good hand hygiene washing with soap and also using hand sanitizer when available, eat a nutritious meals, get adequate rest, hydrate appropriately   Please contact the office if your symptoms worsen or you have concerns that you are not improving.   Thank you for choosing [facility] for your healthcare, and for allowing [place] to partner with you on your healthcare journey. I am thankful to be able to provide care  to you today.   Wyn Quaker FNP-C     Chronic Obstructive Pulmonary Disease Chronic obstructive pulmonary disease (COPD) is a long-term (chronic) lung problem. When you have COPD, it is hard for air to get in and out of your lungs. Usually the condition gets worse over time, and your lungs will never return to normal. There are things you can do to keep yourself as healthy as possible.  Your doctor may treat your condition with: ? Medicines. ? Oxygen. ? Lung surgery.  Your doctor may also recommend: ? Rehabilitation. This includes steps to make your body work better. It may involve a team of specialists. ? Quitting smoking, if you smoke. ? Exercise and changes to your diet. ? Comfort measures (palliative care). Follow these instructions at home: Medicines  Take over-the-counter and prescription medicines only as told by your doctor.  Talk to your doctor before taking any cough or allergy medicines. You may need to avoid medicines that cause your lungs to be dry. Lifestyle  If you smoke, stop. Smoking makes the problem worse. If you need help quitting, ask your doctor.  Avoid being around things that make your breathing worse. This may include smoke, chemicals, and fumes.  Stay active, but remember to rest as well.  Learn and use tips on how to relax.  Make sure you get enough sleep. Most adults need at least 7 hours of sleep every night.  Eat healthy foods. Eat  smaller meals more often. Rest before meals. Controlled breathing Learn and use tips on how to control your breathing as told by your doctor. Try:  Breathing in (inhaling) through your nose for 1 second. Then, pucker your lips and breath out (exhale) through your lips for 2 seconds.  Putting one hand on your belly (abdomen). Breathe in slowly through your nose for 1 second. Your hand on your belly should move out. Pucker your lips and breathe out slowly through your lips. Your hand on your belly should move in as you  breathe out.  Controlled coughing Learn and use controlled coughing to clear mucus from your lungs. Follow these steps: 1. Lean your head a little forward. 2. Breathe in deeply. 3. Try to hold your breath for 3 seconds. 4. Keep your mouth slightly open while coughing 2 times. 5. Spit any mucus out into a tissue. 6. Rest and do the steps again 1 or 2 times as needed. General instructions  Make sure you get all the shots (vaccines) that your doctor recommends. Ask your doctor about a flu shot and a pneumonia shot.  Use oxygen therapy and pulmonary rehabilitation if told by your doctor. If you need home oxygen therapy, ask your doctor if you should buy a tool to measure your oxygen level (oximeter).  Make a COPD action plan with your doctor. This helps you to know what to do if you feel worse than usual.  Manage any other conditions you have as told by your doctor.  Avoid going outside when it is very hot, cold, or humid.  Avoid people who have a sickness you can catch (contagious).  Keep all follow-up visits as told by your doctor. This is important. Contact a doctor if:  You cough up more mucus than usual.  There is a change in the color or thickness of the mucus.  It is harder to breathe than usual.  Your breathing is faster than usual.  You have trouble sleeping.  You need to use your medicines more often than usual.  You have trouble doing your normal activities such as getting dressed or walking around the house. Get help right away if:  You have shortness of breath while resting.  You have shortness of breath that stops you from: ? Being able to talk. ? Doing normal activities.  Your chest hurts for longer than 5 minutes.  Your skin color is more blue than usual.  Your pulse oximeter shows that you have low oxygen for longer than 5 minutes.  You have a fever.  You feel too tired to breathe normally. Summary  Chronic obstructive pulmonary disease (COPD)  is a long-term lung problem.  The way your lungs work will never return to normal. Usually the condition gets worse over time. There are things you can do to keep yourself as healthy as possible.  Take over-the-counter and prescription medicines only as told by your doctor.  If you smoke, stop. Smoking makes the problem worse. This information is not intended to replace advice given to you by your health care provider. Make sure you discuss any questions you have with your health care provider. Document Released: 04/16/2008 Document Revised: 12/03/2016 Document Reviewed: 12/03/2016 Elsevier Interactive Patient Education  2019 [employer] Oxygen Use, Adult When a medical condition keeps you from getting enough oxygen, your health care provider may instruct you to take extra oxygen at home. Your health care provider will let you know:  When to take oxygen.  For how long to take oxygen.  How quickly oxygen should be delivered (flow rate), in liters per minute (LPM or L/M). Home oxygen can be given through:  A mask.  A nasal cannula. This is a device or tube that goes in the nostrils.  A transtracheal catheter. This is a small, flexible tube placed in the trachea.  A tracheostomy. This is a surgically made opening in the trachea. These devices are connected with tubing to an oxygen source, such as:  A tank. Tanks hold oxygen in gas form. They must be replaced when the oxygen is used up.  A liquid oxygen device. This holds oxygen in liquid form. It must be replaced when the oxygen is used up.  An oxygen concentrator machine. This filters oxygen in the room. It uses electricity, so you must have a backup cylinder of oxygen in case the power goes out. Supplies needed: To use oxygen, you will need:  A mask, nasal cannula, transtracheal catheter, or tracheostomy.  An oxygen tank, a liquid oxygen device, or an oxygen concentrator.  The tape that your health care  provider recommends (optional). If you use a transtracheal catheter and your prescribed flow rate is 1 LPM or greater, you will also need a humidifier. Risks and complications  Fire. This can happen if the oxygen is exposed to a heat source, flame, or spark.  Injury to skin. This can happen if liquid oxygen touches your skin.  Organ damage. This can happen if you get too little oxygen. How to use oxygen Your health care provider or a representative from your [employer] will show you how to use your oxygen device. Follow her or his instructions. The instructions may look something like this: 1. Wash your hands. 2. If you use an oxygen concentrator, make sure it is plugged in. 3. Place one end of the tube into the port on the tank, device, or machine. 4. Place the mask over your nose and mouth. Or, place the nasal cannula and secure it with tape if instructed. If you use a tracheostomy or transtracheal catheter, connect it to the oxygen source as directed. 5. Make sure the liter-flow setting on the machine is at the level prescribed by your health care provider. 6. Turn on the machine or adjust the knob on the tank or device to the correct liter-flow setting. 7. When you are done, turn off and unplug the machine, or turn the knob to OFF. How to clean and care for the oxygen supplies Nasal cannula  Clean it with a warm, wet cloth daily or as needed.  Wash it with a liquid soap once a week.  Rinse it thoroughly once or twice a week.  Replace it every 2-4 weeks.  If you have an infection, such as a cold or pneumonia, change the cannula when you get better. Mask  Replace it every 2-4 weeks.  If you have an infection, such as a cold or pneumonia, change the mask when you get better. Humidifier bottle  Wash the bottle between each refill: ? Wash it with soap and warm water. ? Rinse it thoroughly. ? Disinfect it and its top. ? Air-dry it.  Make sure it is dry before you  refill it. Oxygen concentrator  Clean the air filter at least twice a week according to directions from your home medical equipment and service company.  Wipe down the cabinet every day. To do this: ? Unplug the unit. ? Wipe  down the cabinet with a damp cloth. ? Dry the cabinet. Other equipment  Change any extra tubing every 1-3 months.  Follow instructions from your health care provider about taking care of any other equipment. Safety tips Fire safety tips   Keep your oxygen and oxygen supplies at least 5 ft away from sources of heat, flames, and sparks at all times.  Do not allow smoking near your oxygen. Put up "no smoking" signs in your home. Avoid smoking areas when in public.  Do not use materials that can burn (are flammable) while you use oxygen.  When you go to a restaurant with portable oxygen, ask to be seated in the nonsmoking section.  Keep a Data processing manager close by. Let your fire department know that you have oxygen in your home.  Test your home smoke detectors regularly. Traveling  Secure your oxygen tank in the vehicle so that it does not move around. Follow instructions from your medical device company about how to safely secure your tank.  Make sure you have enough oxygen for the amount of time you will be away from home.  If you are planning air travel, contact the airline to find out if they allow the use of an approved portable oxygen concentrator. You may also need documents from your health care provider and medical device company before you travel. General safety tips  If you use an oxygen cylinder, make sure it is in a stand or secured to an object that will not move (fixed object).  If you use liquid oxygen, make sure its container is kept upright.  If you use an oxygen concentrator: ? Dance movement psychotherapist company. Make sure you are given priority service in the event that your power goes out. ? Avoid using extension cords, if possible. Follow  these instructions at home:  Use oxygen only as told by your health care provider.  Do not use alcohol or other drugs that make you relax (sedating drugs) unless instructed. They can slow down your breathing rate and make it hard to get in enough oxygen.  Know how and when to order a refill of oxygen.  Always keep a spare tank of oxygen. Plan ahead for holidays when you may not be able to get a prescription filled.  Use water-based lubricants on your lips or nostrils. Do not use oil-based products like petroleum jelly.  To prevent skin irritation on your cheeks or behind your ears, tuck some gauze under the tubing. Contact a health care provider if:  You get headaches often.  You have shortness of breath.  You have a lasting cough.  You have anxiety.  You are sleepy all the time.  You develop an illness that affects your breathing.  You cannot exercise at your regular level.  You are restless.  You have difficult or irregular breathing, and it is getting worse.  You have a fever.  You have persistent redness under your nose. Get help right away if:  You are confused.  You have blue lips or fingernails.  You are struggling to breathe. Summary  Your health care provider or a representative from your [employer] will show you how to use your oxygen device. Follow her or his instructions.  If you use an oxygen concentrator, make sure it is plugged in.  Make sure the liter-flow setting on the machine is at the level prescribed by your health care provider.  Keep your oxygen and oxygen supplies at least 5 ft away  from sources of heat, flames, and sparks at all times. This information is not intended to replace advice given to you by your health care provider. Make sure you discuss any questions you have with your health care provider. Document Released: 01/19/2004 Document Revised: 04/17/2018 Document Reviewed: 05/22/2016 Elsevier Interactive Patient  Education  2019 [employer].

## 2018-12-18 NOTE — Assessment & Plan Note (Addendum)
Assessment: Patient reporting occasional drops in oxygen at home Oxygen saturations 88 to 90% on 2-1/2 L today in office Walk today in office -patient required 4 L with exertion  Plan: Continue oxygen therapy as prescribed >>> 4 L with exertion and 2-1/2 L at rest

## 2018-12-18 NOTE — Assessment & Plan Note (Addendum)
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

## 2018-12-18 NOTE — Assessment & Plan Note (Signed)
Assessment: Mild AR flare  Plan: Continue Claritin Continue fluticasone nasal spray Start doing nasal saline rinses

## 2019-01-12 ENCOUNTER — Ambulatory Visit (INDEPENDENT_AMBULATORY_CARE_PROVIDER_SITE_OTHER): Payer: Medicaid Other | Admitting: Emergency Medicine

## 2019-01-12 ENCOUNTER — Encounter: Payer: Self-pay | Admitting: Emergency Medicine

## 2019-01-12 VITALS — BP 114/78 | HR 74 | Ht 60.0 in | Wt 227.0 lb

## 2019-01-12 DIAGNOSIS — I5032 Chronic diastolic (congestive) heart failure: Secondary | ICD-10-CM | POA: Diagnosis not present

## 2019-01-12 DIAGNOSIS — J9611 Chronic respiratory failure with hypoxia: Secondary | ICD-10-CM | POA: Diagnosis not present

## 2019-01-12 DIAGNOSIS — J449 Chronic obstructive pulmonary disease, unspecified: Secondary | ICD-10-CM

## 2019-01-12 DIAGNOSIS — Z5181 Encounter for therapeutic drug level monitoring: Secondary | ICD-10-CM | POA: Diagnosis not present

## 2019-01-12 LAB — BASIC METABOLIC PANEL
BUN: 10 mg/dL (ref 6–23)
CALCIUM: 9.5 mg/dL (ref 8.4–10.5)
CO2: 31 mEq/L (ref 19–32)
Chloride: 104 mEq/L (ref 96–112)
Creatinine, Ser: 0.95 mg/dL (ref 0.40–1.20)
GFR: 72.51 mL/min (ref >60.00)
Glucose, Bld: 112 mg/dL — ABNORMAL HIGH (ref 70–99)
Potassium: 3.9 mEq/L (ref 3.5–5.1)
Sodium: 141 mEq/L (ref 135–145)

## 2019-01-12 NOTE — Assessment & Plan Note (Signed)
Continue her oxygen at 2 to 3 L/min.  She is not desaturated today.  I suspect that some of her recent desaturations were related to volume overload, now improved.

## 2019-01-12 NOTE — Progress Notes (Signed)
Full PFT performed today. °

## 2019-01-12 NOTE — Assessment & Plan Note (Signed)
Her PFTs show a progression in her obstruction.  She is hyperinflated but there could be a component of restriction as well given her obesity.  She is on maximal bronchodilator therapy, continue her Trelegy, albuterol as needed.

## 2019-01-12 NOTE — Progress Notes (Signed)
Subjective:    Patient ID: Stephanie Schultz, female    DOB: 1955/12/16, 63 y.o.   MRN: [id number]    ROV 07/29/18 --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  She is been treated in the past with for sleep apnea but her most recent PSG showed only nocturnal hypoxemia without overt obstructive apneas.  At her last visit I changed her Symbicort to Trelegy to see if she would get benefit.  She was unfortunately seen in the emergency department 9/6 with more nasal congestion, sore throat, thick sputum and increased albuterol use.  She was treated with 5 days of prednisone, doxycycline. She is feeling better now. She believes that the Trelegy has been beneficial, her breathing and exertional tolerance is better. She is using 2-3L/min o2. She uses albuterol about 4-5x a day, appears to be using on a schedule.   Acute 10/14/18 --[name] is 89, obese with a history of COPD with an asthmatic component and significant upper airway irritation syndrome associated with chronic cough.  She has hypertension with diastolic dysfunction.  She was formally treated for sleep apnea but her most recent PSG shows only nocturnal hypoxemia without any overt obstruction.  She is now managed on Trelegy.  She presents today for an acute visit complaining of 3 weeks of initially URI sx, then evolved some slight cough with thick yellow mucous. Progressed to exertional SOB. She tried temporarily increasing her diuretics rto see if it would help - no better. She is having SOB when walking through the house. She is using albuterol more frequently.  She remains loratadine, flonase.   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  Also with nocturnal hypoxemia in the absence of OSA.  She is on 2 to 3 L/min by nasal cannula.  Hospitalized  beginning of January for an acute flare, ? Superimposed dCHF decompensation.  She is been seen twice in our office since including on 2/6 when she was noticing exertional desaturations. She just finished PT, feels that it helped her a lot. Her lasix has also been increased - has helped her breathing; then she decreased it because she is worried about her renal fxn.  She had PFT today that I reviewed, shows very severe obstruction, FEV1 33% predicted (down from 50% in 2015). She is Trelegy, tolerates well, gets good benefit. She uses albuterol about  She was treated with azithro for a possible sinusitis. She is on loratadine, fluticasone NS, nexium.    ROS  See history of present illness   Objective:   Physical Exam Vitals:   01/12/19 1207  BP: 114/78  Pulse: 74  SpO2: 90%  Weight: 227 lb (103 kg)  Height: 5' (1.524 m)    Gen: Obese, in no distress,  normal affect  ENT: No lesions,  mouth clear,  oropharynx clear, no postnasal drip, strong voice  Neck: No JVD, no stridor  Lungs: No use of accessory muscles, distant, prolonged exp, no wheeze  Cardiovascular: RRR, heart sounds normal, no murmur or gallops, 1+ LE peripheral edema  Musculoskeletal: No deformities, no cyanosis or clubbing  Neuro: alert, non focal  Skin: Warm, no lesions or rashes, old scars on B UE's   Assessment & Plan:   COPD (chronic obstructive pulmonary disease) (HCC) Her PFTs show a progression in her obstruction.  She is hyperinflated but there could be a  component of restriction as well given her obesity.  She is on maximal bronchodilator therapy, continue her Trelegy, albuterol as needed.  Chronic diastolic CHF (congestive heart failure) ([place]) She decreased her Lasix because she was having some back discomfort and was concerned about renal function.  Have asked her to go back to 40 mg twice daily which was what was recommended by Dr. Radford Pax with cardiology.  I will check a renal panel today to assess for  stability  Chronic respiratory failure with hypoxia (HCC) Continue her oxygen at 2 to 3 L/min.  She is not desaturated today.  I suspect that some of her recent desaturations were related to volume overload, now improved.  [name], MD, PhD 01/12/2019, 12:39 PM [facility] [phone] or if no answer [phone]

## 2019-01-12 NOTE — Addendum Note (Signed)
Addended by: Suzzanne Cloud E on: 01/12/2019 12:48 PM   Modules accepted: Orders

## 2019-01-12 NOTE — Progress Notes (Signed)
Discussed in office visit with [name] today.  Nothing further needed at this time.

## 2019-01-12 NOTE — Patient Instructions (Addendum)
Lab work today Please continue Trelegy once daily.  Remember to rinse and gargle after using. Keep your albuterol available use 2 puffs if needed for shortness of breath, chest tightness, wheezing. Continue your oxygen at 2 to 3 L/min depending on your level of exertion. Please go back to taking your 40 mg twice a day as you have been doing based on your discussions with Dr. Radford Pax. Follow with Dr Lamonte Sakai in 3 months or sooner if you have any problems.

## 2019-01-12 NOTE — Assessment & Plan Note (Signed)
She decreased her Lasix because she was having some back discomfort and was concerned about renal function.  Have asked her to go back to 40 mg twice daily which was what was recommended by Dr. Radford Pax with cardiology.  I will check a renal panel today to assess for stability

## 2019-01-19 ENCOUNTER — Other Ambulatory Visit: Payer: Self-pay | Admitting: Emergency Medicine

## 2019-02-04 ENCOUNTER — Other Ambulatory Visit: Payer: Self-pay | Admitting: Gynecology

## 2019-02-04 DIAGNOSIS — N6452 Nipple discharge: Secondary | ICD-10-CM

## 2019-02-06 ENCOUNTER — Other Ambulatory Visit: Payer: Self-pay

## 2019-02-06 ENCOUNTER — Other Ambulatory Visit: Payer: Self-pay | Admitting: Gynecology

## 2019-02-06 ENCOUNTER — Ambulatory Visit
Admission: RE | Admit: 2019-02-06 | Discharge: 2019-02-06 | Disposition: A | Discharge status: Home / Self Care | Payer: Medicaid Other | Source: Ambulatory Visit | Attending: Gynecology | Admitting: Gynecology

## 2019-02-06 DIAGNOSIS — N6452 Nipple discharge: Secondary | ICD-10-CM

## 2019-02-06 DIAGNOSIS — N61 Mastitis without abscess: Secondary | ICD-10-CM

## 2019-02-13 ENCOUNTER — Ambulatory Visit: Payer: Medicaid Other | Admitting: Emergency Medicine

## 2019-02-20 ENCOUNTER — Ambulatory Visit
Admission: RE | Admit: 2019-02-20 | Discharge: 2019-02-20 | Disposition: A | Discharge status: Home / Self Care | Payer: Medicaid Other | Source: Ambulatory Visit | Attending: Gynecology | Admitting: Gynecology

## 2019-02-20 ENCOUNTER — Other Ambulatory Visit: Payer: Self-pay | Admitting: Gynecology

## 2019-02-20 ENCOUNTER — Other Ambulatory Visit: Payer: Self-pay

## 2019-02-20 DIAGNOSIS — N61 Mastitis without abscess: Secondary | ICD-10-CM

## 2019-02-23 ENCOUNTER — Ambulatory Visit
Admission: RE | Admit: 2019-02-23 | Discharge: 2019-02-23 | Disposition: A | Discharge status: Home / Self Care | Payer: Medicaid Other | Source: Ambulatory Visit | Attending: Gynecology | Admitting: Gynecology

## 2019-02-23 ENCOUNTER — Other Ambulatory Visit: Payer: Self-pay | Admitting: Gynecology

## 2019-02-23 ENCOUNTER — Other Ambulatory Visit: Payer: Self-pay

## 2019-02-23 DIAGNOSIS — N61 Mastitis without abscess: Secondary | ICD-10-CM

## 2019-02-23 HISTORY — PX: BREAST BIOPSY: SHX20

## 2019-03-25 ENCOUNTER — Other Ambulatory Visit: Payer: Self-pay | Admitting: Cardiology

## 2019-03-26 ENCOUNTER — Telehealth: Payer: Self-pay | Admitting: Cardiology

## 2019-03-26 NOTE — Telephone Encounter (Signed)
Virtual Visit Pre-Appointment Phone Call  "(Name), I am calling you today to discuss your upcoming appointment. We are currently trying to limit exposure to the virus that causes COVID-19 by seeing patients at home rather than in the office."  1. "What is the BEST phone number to call the day of the visit?" - include this in appointment notes  2. Do you have or have access to (through a family member/friend) a smartphone with video capability that we can use for your visit?" a. If yes - list this number in appt notes as cell (if different from BEST phone #) and list the appointment type as a VIDEO visit in appointment notes b. If no - list the appointment type as a PHONE visit in appointment notes  3. Confirm consent - "In the setting of the current Covid19 crisis, you are scheduled for a (phone or video) visit with your provider on (date) at (time).  Just as we do with many in-office visits, in order for you to participate in this visit, we must obtain consent.  If you'd like, I can send this to your mychart (if signed up) or email for you to review.  Otherwise, I can obtain your verbal consent now.  All virtual visits are billed to your insurance company just like a normal visit would be.  By agreeing to a virtual visit, we'd like you to understand that the technology does not allow for your provider to perform an examination, and thus may limit your provider's ability to fully assess your condition. If your provider identifies any concerns that need to be evaluated in person, we will make arrangements to do so.  Finally, though the technology is pretty good, we cannot assure that it will always work on either your or our end, and in the setting of a video visit, we may have to convert it to a phone-only visit.  In either situation, we cannot ensure that we have a secure connection.  Are you willing to proceed?" STAFF: Did the patient verbally acknowledge consent to telehealth visit? Document  YES/NO here: Yes/Video/do/xy.me/smartphone [phone] Sharlyne Pacas consent 03/26/19/  4. Advise patient to be prepared - "Two hours prior to your appointment, go ahead and check your blood pressure, pulse, oxygen saturation, and your weight (if you have the equipment to check those) and write them all down. When your visit starts, your provider will ask you for this information. If you have an Apple Watch or Kardia device, please plan to have heart rate information ready on the day of your appointment. Please have a pen and paper handy nearby the day of the visit as well."  5. Give patient instructions for MyChart download to smartphone OR Doximity/Doxy.me as below if video visit (depending on what platform provider is using)  6. Inform patient they will receive a phone call 15 minutes prior to their appointment time (may be from unknown caller ID) so they should be prepared to answer    TELEPHONE CALL NOTE  Josetta Wigal has been deemed a candidate for a follow-up tele-health visit to limit community exposure during the Covid-19 pandemic. I spoke with the patient via phone to ensure availability of phone/video source, confirm preferred email & phone number, and discuss instructions and expectations.  I reminded Markel Mergenthaler to be prepared with any vital sign and/or heart rhythm information that could potentially be obtained via home monitoring, at the time of her visit. I reminded Kalia Vahey to expect  a phone call prior to her visit.  Armando Gang 03/26/2019 3:53 PM   INSTRUCTIONS FOR DOWNLOADING THE MYCHART APP TO SMARTPHONE  - The patient must first make sure to have activated MyChart and know their login information - If Apple, go to [employer] and type in MyChart in the search bar and download the app. If Android, ask patient to go to [employer] and type in [place] in the search bar and download the app. The app is free but as with any other app downloads,  their phone may require them to verify saved payment information or Apple/Android password.  - The patient will need to then log into the app with their MyChart username and password, and select [facility] as their healthcare provider to link the account. When it is time for your visit, go to the MyChart app, find appointments, and click Begin Video Visit. Be sure to Select Allow for your device to access the Microphone and Camera for your visit. You will then be connected, and your provider will be with you shortly.  **If they have any issues connecting, or need assistance please contact MyChart service desk [phone])**  **If using a computer, in order to ensure the best quality for their visit they will need to use either of the following Internet Browsers: [employer], or Google Chrome**  IF USING DOXIMITY or DOXY.ME - The patient will receive a link just prior to their visit by text.     FULL LENGTH CONSENT FOR TELE-HEALTH VISIT   I hereby voluntarily request, consent and authorize [facility] and its employed or contracted physicians, physician assistants, nurse practitioners or other licensed health care professionals (the Practitioner), to provide me with telemedicine health care services (the Services") as deemed necessary by the treating Practitioner. I acknowledge and consent to receive the Services by the Practitioner via telemedicine. I understand that the telemedicine visit will involve communicating with the Practitioner through live audiovisual communication technology and the disclosure of certain medical information by electronic transmission. I acknowledge that I have been given the opportunity to request an in-person assessment or other available alternative prior to the telemedicine visit and am voluntarily participating in the telemedicine visit.  I understand that I have the right to withhold or withdraw my consent to the use of telemedicine in the  course of my care at any time, without affecting my right to future care or treatment, and that the Practitioner or I may terminate the telemedicine visit at any time. I understand that I have the right to inspect all information obtained and/or recorded in the course of the telemedicine visit and may receive copies of available information for a reasonable fee.  I understand that some of the potential risks of receiving the Services via telemedicine include:   Delay or interruption in medical evaluation due to technological equipment failure or disruption;  Information transmitted may not be sufficient (e.g. poor resolution of images) to allow for appropriate medical decision making by the Practitioner; and/or   In rare instances, security protocols could fail, causing a breach of personal health information.  Furthermore, I acknowledge that it is my responsibility to provide information about my medical history, conditions and care that is complete and accurate to the best of my ability. I acknowledge that Practitioner's advice, recommendations, and/or decision may be based on factors not within their control, such as incomplete or inaccurate data provided by me or distortions of diagnostic images or specimens that may  result from electronic transmissions. I understand that the practice of medicine is not an exact science and that Practitioner makes no warranties or guarantees regarding treatment outcomes. I acknowledge that I will receive a copy of this consent concurrently upon execution via email to the email address I last provided but may also request a printed copy by calling the office of [facility].    I understand that my insurance will be billed for this visit.   I have read or had this consent read to me.  I understand the contents of this consent, which adequately explains the benefits and risks of the Services being provided via telemedicine.   I have been provided ample  opportunity to ask questions regarding this consent and the Services and have had my questions answered to my satisfaction.  I give my informed consent for the services to be provided through the use of telemedicine in my medical care  By participating in this telemedicine visit I agree to the above.

## 2019-03-29 NOTE — Progress Notes (Signed)
Virtual Visit via Video Note   This visit type was conducted due to national recommendations for restrictions regarding the COVID-19 Pandemic (e.g. social distancing) in an effort to limit this patient's exposure and mitigate transmission in our community.  Due to her co-morbid illnesses, this patient is at least at moderate risk for complications without adequate follow up.  This format is felt to be most appropriate for this patient at this time.  All issues noted in this document were discussed and addressed.  A limited physical exam was performed with this format.  Please refer to the patient's chart for her consent to telehealth for [facility].   Evaluation Performed:  Follow-up visit  This visit type was conducted due to national recommendations for restrictions regarding the COVID-19 Pandemic (e.g. social distancing).  This format is felt to be most appropriate for this patient at this time.  All issues noted in this document were discussed and addressed.  No physical exam was performed (except for noted visual exam findings with Video Visits).  Please refer to the patient's chart (MyChart message for video visits and phone note for telephone visits) for the patient's consent to telehealth for [facility].  Date:  03/30/2019   ID:  Stephanie Schultz, DOB 09-Apr-1956, MRN [id number]  Patient Location:  Home  Provider location:   [place]  PCP:  [name], MD  Cardiologist:  Fransico Him, MD Electrophysiologist:  None   Chief Complaint:  Chronic diastolic CHF  History of Present Illness:    Stephanie Schultz is a 63 y.o. female who presents via audio/video conferencing for a telehealth visit today.    Stephanie Schultz is a 43 y/o AAFwith a history of DM2, HTN, hyperlipidemia, COPD with chronic respiratory failure on chronic O2, OSA on CPAP, prior tobacco abuse, chronic diastolic CHF and morbid obesity. She had a cath in 2009 showing normal coronary arteries and  normal LVF. She had recurrent CP and underwent repeat LHC 12/03/16 with normal coronaries, EF 55-65%, normal LVEDP.Based on symptoms, increased O2 requirements and increased sputum production, she was felt to have an acute COPD exacerbation and was treated with improvement in symptoms.  She is here today for followup and is doing well.  She denies any chest pain or pressure, PND, orthopnea, , dizziness, palpitations or syncope.  She has chronic LE edema which she thinks is stable.  She is compliant with her meds and is tolerating meds with no SE. She has chronic shortness of breath and is on chronic O2 at 2.5 L nasal cannula and uses 3 L with activity due to her underlying COPD.  She says that her breathing has been doing much better.  She wears her O2 all the time.  The patient does not have symptoms concerning for COVID-19 infection (fever, chills, cough, or new shortness of breath).    Prior CV studies:   The following studies were reviewed today:  none  Past Medical History:  Diagnosis Date  . Anemia    chronis  . Anxiety   . Arthritis   . Cervical cancer ([place]) 1990   cervical   . Chronic diastolic CHF (congestive heart failure) ([place]) 08/10/2016  . COPD (chronic obstructive pulmonary disease) ([place])    O2 dependent. Pulmo: [name]  . Diabetes mellitus without complication ([place])    a. A1c 8.3 in 11/2015  . Emphysema   . Gallstones    s/p cholecystectomy  . GERD (gastroesophageal reflux disease)   . Gout   .  History of home oxygen therapy    "2.5L; 24/7" (08/10/2016)  . Hx of cardiac catheterization    a. LHC at [facility] in [place], [place] 09/2008:  Normal coronary arteries EF 70%. b. LHC 11/2016: normal cors, normal LVEDP, EF 55-65%.  . Hyperlipidemia   . Hypertension   . Hypoxia 11/17/2018  . Morbid obesity ([place])   . OSA on CPAP    CPAP at night   . Pneumonia   . Sickle cell trait ([place])   . Tobacco abuse    a. up to 3ppd from age 41 to 82, now 1/4 ppd (01/2013) >> Quit 10/2015    Past Surgical History:  Procedure Laterality Date  . BREAST BIOPSY    . CARDIAC CATHETERIZATION    . CARDIAC CATHETERIZATION N/A 12/03/2016   Procedure: Left Heart Cath and Coronary Angiography;  Surgeon: Peter M Martinique, MD;  Location: [facility];  Service: Cardiovascular;  Laterality: N/A;  . CHOLECYSTECTOMY N/A 12/13/2015   Procedure: LAPAROSCOPIC CHOLECYSTECTOMY;  Surgeon: Ralene Ok, MD;  Location: [facility];  Service: General;  Laterality: N/A;  . COLONOSCOPY  09/05/2012   Procedure: COLONOSCOPY;  Surgeon: Beryle Beams, MD;  Location: [facility];  Service: Endoscopy;  Laterality: N/A;  . COLONOSCOPY WITH PROPOFOL N/A 09/02/2015   Procedure: COLONOSCOPY WITH PROPOFOL;  Surgeon: Carol Ada, MD;  Location: [facility];  Service: Endoscopy;  Laterality: N/A;  . ESOPHAGOGASTRODUODENOSCOPY (EGD) WITH PROPOFOL N/A 03/29/2017   Procedure: ESOPHAGOGASTRODUODENOSCOPY (EGD) WITH PROPOFOL;  Surgeon: Carol Ada, MD;  Location: [facility];  Service: Endoscopy;  Laterality: N/A;  . TUBAL LIGATION       Current Meds  Medication Sig  . allopurinol (ZYLOPRIM) 100 MG tablet Take 100 mg by mouth daily.  Marland Kitchen ALPRAZolam (XANAX) 1 MG tablet Take 1 mg by mouth at bedtime as needed for sleep.   Marland Kitchen aspirin EC 81 MG tablet Take 81 mg by mouth daily.  . Cholecalciferol (VITAMIN D PO) Take 1 tablet by mouth daily.  . clobetasol cream (TEMOVATE) 7.34 % Apply 1 application topically daily as needed (irritation from pull ups).   . esomeprazole (NEXIUM) 40 MG capsule Take 40 mg by mouth daily at 12 noon.  . fluticasone (FLONASE) 50 MCG/ACT nasal spray Place 1 spray into both nostrils daily as needed (congestion).  . furosemide (LASIX) 40 MG tablet TAKE 1 TABLET IN THE AM AND 1/2 TABLET IN PM.  . glipiZIDE (GLUCOTROL) 10 MG tablet Take 10 mg by mouth 2 (two) times daily before a meal.   . HYDROcodone-acetaminophen (NORCO) 7.5-325 MG tablet Take 1 tablet by mouth every 6 (six) hours as needed for  moderate pain.  Marland Kitchen insulin glargine (LANTUS) 100 unit/mL SOPN Inject 18 Units into the skin at bedtime.   Marland Kitchen ipratropium-albuterol (DUONEB) 0.5-2.5 (3) MG/3ML SOLN Take 3 mLs by nebulization every 6 (six) hours as needed (wheezing, shortness of breath).  . Linaclotide (LINZESS) 145 MCG CAPS capsule Take 145 mcg by mouth daily.   Marland Kitchen loratadine (CLARITIN) 10 MG tablet Take 10 mg by mouth daily.  . metoprolol succinate (TOPROL-XL) 50 MG 24 hr tablet Take 1 tablet (50 mg total) by mouth daily. Take with or immediately following a meal.  . ondansetron (ZOFRAN ODT) 4 MG disintegrating tablet Take 1 tablet (4 mg total) by mouth every 8 (eight) hours as needed for nausea or vomiting.  . OXYGEN Inhale 2.5 L into the lungs continuous.   Vladimir Faster Glycol-Propyl Glycol (SYSTANE ULTRA OP) Place 2 drops into both  eyes 2 (two) times daily.  . Potassium Chloride ER 20 MEQ TBCR Take 20 mEq by mouth daily.  . potassium chloride SA (K-DUR) 20 MEQ tablet TAKE 1 TABLET IN THE MORNING. (Patient taking differently: Take 20 mEq by mouth 2 (two) times daily. )  . PROAIR HFA 108 (90 Base) MCG/ACT inhaler INHALE 2 PUFFS BY MOUTH EVERY 4 HOURS AS NEEDED FOR WHEEZING (Patient taking differently: Inhale 2 puffs into the lungs every 4 (four) hours as needed for wheezing or shortness of breath. )  . Respiratory Therapy Supplies (FLUTTER) DEVI 1 Device by Does not apply route 3 (three) times daily.  . sitaGLIPtin (JANUVIA) 100 MG tablet Take 100 mg by mouth daily.  Marland Kitchen spironolactone (ALDACTONE) 25 MG tablet Take 0.5 tablets (12.5 mg total) by mouth daily.  . TRELEGY ELLIPTA 100-62.5-25 MCG/INH AEPB INHALE 1 PUFF INTO THE LUNGS ONCE DAILY AS DIRECTED.  Marland Kitchen zolpidem (AMBIEN) 10 MG tablet Take 10 mg by mouth at bedtime.     Allergies:   Acetaminophen   Social History   Tobacco Use  . Smoking status: Former Smoker    Packs/day: 3.00    Years: 36.00    Pack years: 108.00    Types: Cigarettes    Last attempt to quit: 11/11/2015     Years since quitting: 3.3  . Smokeless tobacco: Never Used  . Tobacco comment: Approx 90 pk-yrs (up to 3ppd until ~ 2009). Smoking 3 cigs per day now.  Substance Use Topics  . Alcohol use: No    Alcohol/week: 0.0 standard drinks  . Drug use: No     Family Hx: The patient's family history includes Diabetes in her mother; Lung cancer in her paternal aunt and paternal grandfather; Myasthenia gravis in her mother; Other in her father and another family member. There is no history of Heart attack, Heart failure, or Breast cancer.  ROS:   Please see the history of present illness.     All other systems reviewed and are negative.   Labs/Other Tests and Data Reviewed:    Recent Labs: 11/17/2018: ALT 38; Hemoglobin 12.2; Platelets 241 01/12/2019: BUN 10; Creatinine, Ser 0.95; Potassium 3.9; Sodium 141   Recent Lipid Panel Lab Results  Component Value Date/Time   CHOL 119 12/01/2016 02:05 AM   TRIG 212 (H) 12/01/2016 02:05 AM   HDL 34 (L) 12/01/2016 02:05 AM   CHOLHDL 3.5 12/01/2016 02:05 AM   LDLCALC 43 12/01/2016 02:05 AM    Wt Readings from Last 3 Encounters:  03/30/19 218 lb 3.2 oz (99 kg)  01/12/19 227 lb (103 kg)  12/18/18 225 lb 12.8 oz (102.4 kg)     Objective:    Vital Signs:  Temp (!) 96.6 F (35.9 C)   Ht 5' (1.524 m)   Wt 218 lb 3.2 oz (99 kg)   LMP 06/04/2000 (LMP Unknown) Comment: tubal ligation  BMI 42.61 kg/m    CONSTITUTIONAL:  Well nourished, well developed female in no acute distress.  EYES: anicteric MOUTH: oral mucosa is pink RESPIRATORY: Normal respiratory effort, symmetric expansion CARDIOVASCULAR: No peripheral edema SKIN: No rash, lesions or ulcers MUSCULOSKELETAL: no digital cyanosis NEURO: Cranial Nerves II-XII grossly intact, moves all extremities PSYCH: Intact judgement and insight.  A&O x 3, Mood/affect appropriate   ASSESSMENT & PLAN:    1.   Chronic diastolic CHF -her weight is been stable.  She has not had any lower extremity edema.   She has chronic shortness of breath related to her COPD but  is not had any change in her O2 requirements recently.  She is trying to follow a low-sodium diet.  Again I have stressed the importance of daily weights and calling if weight goes over 3 pounds in a day or 5 pounds in a week.  She will continue on Lasix 40 mg BID.  Her last creatinine was stable at 0.95 on 01/12/2019.  K+ was 3.9.  2.  Hypertension - She will continue on Toprol-XL 50 mg daily and spironolactone 12.5 mg daily  3.  Obesity - She has lost a few pounds since the fall. She is doing an exercise program on the TV for senior exercise.  4.  Chronic shortness of breath related to COPD -this is followed by pulmonary medicine.  She is on chronic O2 at home.  SHe is stable with her breathing.    5.  Diabetes mellitus type 2 -this is followed by her PCP.  Her last hemoglobin A1c in January was 7.9.  She will continue on glipizide 10 mg daily as well as Lantus insulin 18 units at bedtime as well as Januvia 100 mg daily.  COVID-19 Education: The signs and symptoms of COVID-19 were discussed with the patient and how to seek care for testing (follow up with PCP or arrange E-visit).  The importance of social distancing was discussed today.  Patient Risk:   After full review of this patient's clinical status, I feel that they are at least moderate risk at this time.  Time:   Today, I have spent 15 minutes directly with the patient on video discussing medical problems including CHF, obesity, HTN.  We also reviewed the symptoms of COVID 19 and the ways to protect against contracting the virus with telehealth technology.  I spent an additional 5 minutes reviewing patient's chart including prior notes.  Medication Adjustments/Labs and Tests Ordered: Current medicines are reviewed at length with the patient today.  Concerns regarding medicines are outlined above.  Tests Ordered: No orders of the defined types were placed in this encounter.   Medication Changes: No orders of the defined types were placed in this encounter.   Disposition:  Follow up in 6 month(s)  Signed, Fransico Him, MD  03/30/2019 8:42 AM    [facility]

## 2019-03-30 ENCOUNTER — Encounter: Payer: Self-pay | Admitting: Cardiology

## 2019-03-30 ENCOUNTER — Other Ambulatory Visit: Payer: Self-pay

## 2019-03-30 ENCOUNTER — Telehealth (INDEPENDENT_AMBULATORY_CARE_PROVIDER_SITE_OTHER): Payer: Medicaid Other | Admitting: Cardiology

## 2019-03-30 VITALS — Temp 96.6°F | Ht 60.0 in | Wt 218.2 lb

## 2019-03-30 DIAGNOSIS — R0602 Shortness of breath: Secondary | ICD-10-CM

## 2019-03-30 DIAGNOSIS — Z7189 Other specified counseling: Secondary | ICD-10-CM

## 2019-03-30 DIAGNOSIS — N181 Chronic kidney disease, stage 1: Secondary | ICD-10-CM

## 2019-03-30 DIAGNOSIS — E1122 Type 2 diabetes mellitus with diabetic chronic kidney disease: Secondary | ICD-10-CM | POA: Diagnosis not present

## 2019-03-30 DIAGNOSIS — I5032 Chronic diastolic (congestive) heart failure: Secondary | ICD-10-CM

## 2019-03-30 DIAGNOSIS — I10 Essential (primary) hypertension: Secondary | ICD-10-CM | POA: Diagnosis not present

## 2019-03-30 DIAGNOSIS — Z794 Long term (current) use of insulin: Secondary | ICD-10-CM

## 2019-03-30 NOTE — Patient Instructions (Signed)
Medication Instructions:  Your physician recommends that you continue on your current medications as directed. Please refer to the Current Medication list given to you today.  If you need a refill on your cardiac medications before your next appointment, please call your pharmacy.   Lab work: None If you have labs (blood work) drawn today and your tests are completely normal, you will receive your results only by: . MyChart Message (if you have MyChart) OR . A paper copy in the mail If you have any lab test that is abnormal or we need to change your treatment, we will call you to review the results.  Testing/Procedures: None  Follow-Up: At [facility], you and your health needs are our priority.  As part of our continuing mission to provide you with exceptional heart care, we have created designated Provider Care Teams.  These Care Teams include your primary Cardiologist (physician) and Advanced Practice Providers (APPs -  Physician Assistants and Nurse Practitioners) who all work together to provide you with the care you need, when you need it. You will need a follow up appointment in 6 months.  Please call our office 2 months in advance to schedule this appointment.  You may see Dr. Turner or one of the following Advanced Practice Providers on your designated Care Team:   Brittainy Simmons, PA-C Dayna Dunn, PA-C . Michele Lenze, PA-C     

## 2019-04-02 ENCOUNTER — Telehealth: Payer: Self-pay | Admitting: Emergency Medicine

## 2019-04-02 NOTE — Telephone Encounter (Signed)
Schedule patient for televisit or video visit for tomm with APP.   Stephanie Schultz

## 2019-04-02 NOTE — Telephone Encounter (Signed)
Primary Pulmonologist: RB Last office visit and with whom: 01/12/19 with RB What do we see them for (pulmonary problems): COPD and resp failure Last OV assessment/plan: Lab work today Please continue Trelegy once daily.  Remember to rinse and gargle after using. Keep your albuterol available use 2 puffs if needed for shortness of breath, chest tightness, wheezing. Continue your oxygen at 2 to 3 L/min depending on your level of exertion. Please go back to taking your 40 mg twice a day as you have been doing based on your discussions with Dr. Radford Pax. Follow with Dr Lamonte Sakai in 3 months or sooner if you have any problems.  Was appointment offered to patient (explain)?  Patient wanted recommendations first.    Reason for call: Spoke with patient. She stated that her SOB stated last Saturday. It has been getting worse ever since. She is still using her O2 and recently started using her DuoNebs. DuoNebs have helped but only for a short period of time. Denies any fevers, body aches or chest pain. Denies being around with COVID19 and she has been in her house except for drs appts and to go the store.  She wants to know if she can have a prednisone taper.   Pharmacy is [facility] on [address].   Aaron Edelman, please advise. Thanks!

## 2019-04-02 NOTE — Telephone Encounter (Signed)
Spoke with pt and scheduled appt with TN 05/22 at 930am.  Nothing further is needed.

## 2019-04-03 ENCOUNTER — Encounter: Payer: Self-pay | Admitting: Nurse Practitioner

## 2019-04-03 ENCOUNTER — Other Ambulatory Visit: Payer: Self-pay

## 2019-04-03 ENCOUNTER — Ambulatory Visit (INDEPENDENT_AMBULATORY_CARE_PROVIDER_SITE_OTHER): Payer: Medicaid Other | Admitting: Nurse Practitioner

## 2019-04-03 DIAGNOSIS — J441 Chronic obstructive pulmonary disease with (acute) exacerbation: Secondary | ICD-10-CM | POA: Insufficient documentation

## 2019-04-03 MED ORDER — DOXYCYCLINE HYCLATE 100 MG PO TABS: 100.0000 mg | ORAL_TABLET | Freq: Two times a day (BID) | ORAL | 0 refills | Status: DC

## 2019-04-03 MED ORDER — PREDNISONE 10 MG PO TABS: ORAL_TABLET | ORAL | 0 refills | Status: DC

## 2019-04-03 NOTE — Patient Instructions (Addendum)
COPD exacerbation: Will order doxycycline Will order prednisone Continue trelegy Continue albuterol as needed  Chronic respiratory Failure: Continue O2 2-3L [place] continuously  Follow up: Follow up with [name] in 3 months or sooner if needed

## 2019-04-03 NOTE — Progress Notes (Signed)
Virtual Visit via Telephone Note  I connected with Stephanie Schultz on 04/03/19 at  9:30 AM EDT by telephone and verified that I am speaking with the correct person using two identifiers.  Location: Patient: home Provider: office   I discussed the limitations, risks, security and privacy concerns of performing an evaluation and management service by telephone and the availability of in person appointments. I also discussed with the patient that there may be a patient responsible charge related to this service. The patient expressed understanding and agreed to proceed.   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.   Patient has a tele-visit today for an acute visit.  She complains today of cough and shortness of breath with sinus congestion, pressure, and pain.  She states that symptoms started 1 week ago.  She denies any fever.  She states that her temperature this morning is 98.1 F.  Patient states that her cough is nonproductive.  She states that she has been using Flonase with minimal relief noted.  She is compliant with Trelegy and states that it has been working very well for her.  She uses albuterol as needed.  She is on Lasix which is dosed by cardiology.  Patient continues on 2 to 3 L of O2 continuously. Denies f/c/s, n/v/d, hemoptysis, PND, leg swelling.  Observations/Objective: CXR 11/17/18 - Stable from prior.  No evidence of acute disease.  PFT Results Latest Ref Rng & Units 01/12/2019 11/16/2013  FVC-Pre L - 1.58  FVC-Predicted Pre % 51 67  FVC-Post L 1.18 1.66  FVC-Predicted Post % 52 70  Pre FEV1/FVC % % 52 59  Post FEV1/FCV % % 56 60  FEV1-Pre L 0.59 0.94  FEV1-Predicted Pre % 33 50  FEV1-Post L 0.66 1.00  DLCO UNC% % 33 39  DLCO COR %Predicted % 60 61  TLC L 6.71  3.60  TLC % Predicted % 150 80  RV % Predicted % 254 109   Assessment and Plan: Patient has a tele-visit today for an acute visit.  She complains today of cough and shortness of breath with sinus congestion, pressure, and pain.  She states that symptoms started 1 week ago.  She denies any fever.  She states that her temperature this morning is 98.1 F.  Patient states that her cough is nonproductive.  She states that she has been using Flonase with minimal relief noted.  She is compliant with Trelegy and states that it has been working very well for her.  She uses albuterol as needed.  She is on Lasix which is dosed by cardiology.  Patient continues on 2 to 3 L of O2 continuously.   Patient Instructions  COPD exacerbation: Will order doxycycline Will order prednisone Continue trelegy Continue albuterol as needed  Chronic respiratory Failure: Continue O2 2-3L [place] continuously    Follow Up Instructions: Follow up with [name] in 3 months or sooner if needed    I discussed the assessment and treatment plan with the patient. The patient was provided an opportunity to ask questions and all were answered. The patient agreed with the plan and demonstrated an understanding of the instructions.   The patient was advised to call back or seek an in-person evaluation if the symptoms worsen or if the condition fails to improve as anticipated.  I provided 22 minutes of  non-face-to-face time during this encounter.   Fenton Foy, NP

## 2019-04-03 NOTE — Assessment & Plan Note (Signed)
Patient has a tele-visit today for an acute visit.  She complains today of cough and shortness of breath with sinus congestion, pressure, and pain.  She states that symptoms started 1 week ago.  She denies any fever.  She states that her temperature this morning is 98.1 F.  Patient states that her cough is nonproductive.  She states that she has been using Flonase with minimal relief noted.  She is compliant with Trelegy and states that it has been working very well for her.  She uses albuterol as needed.  She is on Lasix which is dosed by cardiology.  Patient continues on 2 to 3 L of O2 continuously.   Patient Instructions  COPD exacerbation: Will order doxycycline Will order prednisone Continue trelegy Continue albuterol as needed  Chronic respiratory Failure: Continue O2 2-3L [place] continuously  Follow up: Follow up with [name] in 3 months or sooner if needed

## 2019-05-27 ENCOUNTER — Other Ambulatory Visit: Payer: Self-pay | Admitting: Internal Medicine

## 2019-06-17 ENCOUNTER — Other Ambulatory Visit: Payer: Self-pay | Admitting: Internal Medicine

## 2019-06-18 ENCOUNTER — Encounter: Payer: Medicaid Other | Attending: Internal Medicine | Admitting: Dietician

## 2019-06-18 ENCOUNTER — Encounter: Payer: Self-pay | Admitting: Dietician

## 2019-06-18 ENCOUNTER — Other Ambulatory Visit: Payer: Self-pay

## 2019-06-18 DIAGNOSIS — E1165 Type 2 diabetes mellitus with hyperglycemia: Secondary | ICD-10-CM | POA: Diagnosis present

## 2019-06-18 DIAGNOSIS — Z794 Long term (current) use of insulin: Secondary | ICD-10-CM | POA: Insufficient documentation

## 2019-06-18 NOTE — Progress Notes (Signed)
Diabetes Self-Management Education  Visit Type: First/Initial  Appt. Start Time: 1310 Appt. End Time: 3664 Appointment cut short as patient's aide needed to pick her up .  Completion of education including further meal planning in 3 weeks.  06/21/2019  Stephanie Schultz, identified by name and date of birth, is a 63 y.o. female with a diagnosis of Diabetes: Type 2.   ASSESSMENT History includes type 2 diabetes for about 15 years, HTN, COPD on home oxygen.  She states that she was on C-pap and felt better on this but no longer meets testing criteria to maintain.  She states that she has trouble falling asleep and sleeping through the night.  She also has gout. Weight today 218 lbs and feels best at 215 lbs. A1C 7.9% 11/18/2018 increased to 8.4% 05/29/2019 and vitamin D 41 05/29/2019. GFR 72 01/2019 She states that she was on Prednisone 3 months ago. Medications include glipizide, Lantus 25 units q HS, Humalog if glucose is >200, Januvia  Patient lives alone.  She is on disability.  She is currently in school to get her GED.  [place] about 10 years ago.  Her daughter helps with bills and other things as able but does not live locally.  She has an aide who helps her shop.  She does her own cooking. She reports to problems having adequate food at this time although does buy salads at [employer] salad's often.  Discussed that these salads are high in sodium (1150 mg per 1/2 salad without dressing).  She states that she takes so much medication, she is seldom hungry in the am and waits to eat until about 3-5 pm except for snacks.  She will occasional have a low BG and she treats this with peppermint candy or regular soda.  She states that she struggles with sugary drinks, gatorade, and homemade cake.  She does not use any added salt. She relies on [employer] and her aide for transportation.  Height 5' (1.524 m), weight 218 lb (98.9 kg), last menstrual period 06/04/2000. Body mass index is  42.58 kg/m.  Diabetes Self-Management Education - 06/18/19 1339      Visit Information   Visit Type  First/Initial      Initial Visit   Diabetes Type  Type 2    Are you currently following a meal plan?  No    Are you taking your medications as prescribed?  Yes    Date Diagnosed  2012      Health Coping   How would you rate your overall health?  Good      Psychosocial Assessment   Patient Belief/Attitude about Diabetes  Motivated to manage diabetes    Self-care barriers  Debilitated state due to current medical condition    Self-management support  Doctor's office;Family   Aide   Other persons present  Patient    Patient Concerns  Nutrition/Meal planning;Glycemic Control;Weight Control;Problem Solving    Special Needs  None    Preferred Learning Style  No preference indicated    Learning Readiness  Ready    How often do you need to have someone help you when you read instructions, pamphlets, or other written materials from your doctor or pharmacy?  1 - Never    What is the last grade level you completed in school?  11th grade      Pre-Education Assessment   Patient understands the diabetes disease and treatment process.  Needs Review    Patient understands  incorporating nutritional management into lifestyle.  Needs Review    Patient undertands incorporating physical activity into lifestyle.  Needs Review    Patient understands using medications safely.  Needs Review    Patient understands monitoring blood glucose, interpreting and using results  Needs Review    Patient understands prevention, detection, and treatment of acute complications.  Needs Review    Patient understands prevention, detection, and treatment of chronic complications.  Needs Review    Patient understands how to develop strategies to address psychosocial issues.  Needs Review    Patient understands how to develop strategies to promote health/change behavior.  Needs Review      Complications   Last HgB A1C  per patient/outside source  8.4 %   05/29/2019 increased from 7.9% 11/18/18   How often do you check your blood sugar?  1-2 times/day    Fasting Blood glucose range (mg/dL)  180-200    Number of hypoglycemic episodes per month  1    Can you tell when your blood sugar is low?  Yes    What do you do if your blood sugar is low?  peppermint candy or regular soda    Have you had a dilated eye exam in the past 12 months?  Yes    Have you had a dental exam in the past 12 months?  No    Are you checking your feet?  Yes    How many days per week are you checking your feet?  1      Dietary Intake   Breakfast  skips- no appetite after medication OR bacon, grits or oatmeal, occasional toast    Snack (morning)  occasional grapes    Lunch  skips    Snack (afternoon)  grapes    Dinner  salad ([employer])   3-5   Snack (evening)  popcorn (lite butter), low salt chips    Beverage(s)  water, regular soda,zero gatorade, juice      Exercise   Exercise Type  Light (walking / raking leaves)   senior exercise video   How many days per week to you exercise?  5    How many minutes per day do you exercise?  60    Total minutes per week of exercise  300      Patient Education   Previous Diabetes Education  Yes (please comment)   with myself 2017   Disease state   Definition of diabetes, type 1 and 2, and the diagnosis of diabetes    Nutrition management   Role of diet in the treatment of diabetes and the relationship between the three main macronutrients and blood glucose level;Food label reading, portion sizes and measuring food.;Carbohydrate counting;Meal options for control of blood glucose level and chronic complications.;Meal timing in regards to the patients' current diabetes medication.    Physical activity and exercise   Role of exercise on diabetes management, blood pressure control and cardiac health.;Helped patient identify appropriate exercises in relation to his/her diabetes, diabetes complications and  other health issue.    Medications  Reviewed patients medication for diabetes, action, purpose, timing of dose and side effects.    Monitoring  Purpose and frequency of SMBG.;Identified appropriate SMBG and/or A1C goals.;Daily foot exams;Yearly dilated eye exam    Acute complications  Taught treatment of hypoglycemia - the 15 rule.;Discussed and identified patients' treatment of hyperglycemia.    Chronic complications  Relationship between chronic complications and blood glucose control;Dental care;Retinopathy and reason for yearly dilated eye  exams    Psychosocial adjustment  Worked with patient to identify barriers to care and solutions;Role of stress on diabetes;Identified and addressed patients feelings and concerns about diabetes      Individualized Goals (developed by patient)   Nutrition  General guidelines for healthy choices and portions discussed;Follow meal plan discussed    Physical Activity  Exercise 3-5 times per week;15 minutes per day    Medications  take my medication as prescribed    Monitoring   test my blood glucose as discussed    Reducing Risk  do foot checks daily;increase portions of healthy fats    Health Coping  discuss diabetes with (comment)   MD, RD, CDE     Post-Education Assessment   Patient understands the diabetes disease and treatment process.  Demonstrates understanding / competency    Patient understands incorporating nutritional management into lifestyle.  Needs Review    Patient undertands incorporating physical activity into lifestyle.  Demonstrates understanding / competency    Patient understands using medications safely.  Demonstrates understanding / competency    Patient understands monitoring blood glucose, interpreting and using results  Demonstrates understanding / competency    Patient understands prevention, detection, and treatment of acute complications.  Demonstrates understanding / competency    Patient understands prevention, detection, and  treatment of chronic complications.  Demonstrates understanding / competency    Patient understands how to develop strategies to address psychosocial issues.  Demonstrates understanding / competency    Patient understands how to develop strategies to promote health/change behavior.  Needs Review      Outcomes   Expected Outcomes  Demonstrated interest in learning. Expect positive outcomes    Future DMSE  4-6 wks    Program Status  Completed       Individualized Plan for Diabetes Self-Management Training:   Learning Objective:  Patient will have a greater understanding of diabetes self-management. Patient education plan is to attend individual and/or group sessions per assessed needs and concerns.   Plan:   Patient Instructions  Choose water or diet gingerale.  Avoid beverages without carbohydrates. Be sure to drink enough fluid Aim to have small meals and avoid skipping meals.  Simple is fine such as backed chicken, baked summer squash and onions, baked sweet potato. Try making your own salad rather than [employer] which is very high sodium. Consider LUVO frozen meals.  These are low sodium Have a small amount of protein with each meal (meat, egg, beans, milk) 1/2 your plate should be non starchy vegetables 1/4 of your plate should be carbohydrate  You can have 1 small serving of fruit with each meal Continue to avoid added salt. Choose low sodium soup and no added salt vegetables.   Expected Outcomes:  Demonstrated interest in learning. Expect positive outcomes  Education material provided: ADA - How to Thrive: A Guide for Your Journey with Diabetes, Meal plan card, My Plate and Snack sheet  If problems or questions, patient to contact team via:  Phone and Email  Future DSME appointment: 4-6 wks

## 2019-06-18 NOTE — Patient Instructions (Addendum)
Choose water or diet gingerale.  Avoid beverages without carbohydrates. Be sure to drink enough fluid Aim to have small meals and avoid skipping meals.  Simple is fine such as backed chicken, baked summer squash and onions, baked sweet potato. Try making your own salad rather than [employer] which is very high sodium. Consider LUVO frozen meals.  These are low sodium Have a small amount of protein with each meal (meat, egg, beans, milk) 1/2 your plate should be non starchy vegetables 1/4 of your plate should be carbohydrate  You can have 1 small serving of fruit with each meal Continue to avoid added salt. Choose low sodium soup and no added salt vegetables.

## 2019-07-03 ENCOUNTER — Ambulatory Visit: Payer: Medicaid Other | Admitting: Dietician

## 2019-07-07 ENCOUNTER — Other Ambulatory Visit: Payer: Self-pay | Admitting: Emergency Medicine

## 2019-07-07 ENCOUNTER — Telehealth: Payer: Self-pay | Admitting: Emergency Medicine

## 2019-07-07 NOTE — Telephone Encounter (Signed)
Medication name and strength: Trelegy Provider: [place]: [place] Patient insurance ID: [id number] M  Patient has Medicaid coverage so the PA was started with [employer] over the phone. PA was approved until August 20th 2021.   Pharmacy is aware of approval.

## 2019-08-10 ENCOUNTER — Other Ambulatory Visit: Payer: Self-pay | Admitting: Cardiology

## 2019-08-13 ENCOUNTER — Other Ambulatory Visit: Payer: Self-pay

## 2019-08-13 ENCOUNTER — Ambulatory Visit: Payer: Medicaid Other | Admitting: Pulmonary Disease

## 2019-08-13 ENCOUNTER — Encounter: Payer: Self-pay | Admitting: Pulmonary Disease

## 2019-08-13 ENCOUNTER — Telehealth: Payer: Self-pay | Admitting: Pulmonary Disease

## 2019-08-13 DIAGNOSIS — J9611 Chronic respiratory failure with hypoxia: Secondary | ICD-10-CM

## 2019-08-13 DIAGNOSIS — J441 Chronic obstructive pulmonary disease with (acute) exacerbation: Secondary | ICD-10-CM

## 2019-08-13 DIAGNOSIS — J449 Chronic obstructive pulmonary disease, unspecified: Secondary | ICD-10-CM

## 2019-08-13 MED ORDER — PREDNISONE 10 MG PO TABS: ORAL_TABLET | ORAL | 0 refills | Status: DC

## 2019-08-13 MED ORDER — AZITHROMYCIN 250 MG PO TABS: ORAL_TABLET | ORAL | 0 refills | Status: DC

## 2019-08-13 NOTE — Progress Notes (Signed)
Virtual Visit via Telephone Note  I attempted to connect with Stephanie Schultz on 08/13/19 at  9:30 AM EDT by telephone and verified that I am speaking with the correct person using two identifiers.  2 attempts were made to reach the patient.  2 voicemails were left.  No return calls were made.  Patient will need to be rescheduled.  Location: Patient: Home Provider: Office [facility] - [id number], [place], [place], [place] [address]   I discussed the limitations, risks, security and privacy concerns of performing an evaluation and management service by telephone and the availability of in person appointments. I also discussed with the patient that there may be a patient responsible charge related to this service. The patient expressed understanding and agreed to proceed.  Patient consented to consult via telephone: Yes People present and their role in pt care: Pt     History of Present Illness: 63 year old female former smoker followed in our office for COPD with an asthmatic component, upper airway irritation syndrome, chronic cough, and chronic respiratory failure  PMH: Obesity, hypertension, diastolic dysfunction Smoker/ Smoking History: Former smoker.  Quit 2016.  108 pack year smoker.  Maintenance: Trelegy Ellipta Pt of: [name]  Chief complaint:     Observations/Objective:  11/17/2018-chest x-ray- no evidence of acute disease  08/23/2017-echocardiogram-LV ejection fraction 60 to 65%  11/16/2013-pulmonary function test- FVC 1.58 (67% predicted), postbronchodilator ratio 60, postbronchodilator FEV1 1.00 (53% predicted), no significant bronchodilator response, mid flow reversibility after administration of bronchodilator, DLCO 39  Assessment and Plan:  No problem-specific Assessment & Plan notes found for this encounter.   Follow Up Instructions:  Return if symptoms worsen or fail to improve.   I discussed the assessment and treatment plan with the  patient. The patient was provided an opportunity to ask questions and all were answered. The patient agreed with the plan and demonstrated an understanding of the instructions.   The patient was advised to call back or seek an in-person evaluation if the symptoms worsen or if the condition fails to improve as anticipated.  I provided 0 minutes of non-face-to-face time during this encounter.  Patient was unable to be reached.  Voicemails were left for the patient.  Triage note started to get the patient rescheduled with our office.   Lauraine Rinne, NP

## 2019-08-13 NOTE — Assessment & Plan Note (Signed)
Plan: Doxycycline Prednisone taper Need close follow-up with our office

## 2019-08-13 NOTE — Telephone Encounter (Signed)
LMTCB- please reschedule her televisit with APP or [place]

## 2019-08-13 NOTE — Telephone Encounter (Signed)
Pt stated she did not get the tele visit call and wants to know if Aaron Edelman could call her back its about her O2 levels

## 2019-08-13 NOTE — Progress Notes (Signed)
Virtual Visit via Telephone Note  I connected with Stephanie Schultz on 08/13/19 at 11:30 AM EDT by telephone and verified that I am speaking with the correct person using two identifiers.  Location: Patient: Home Provider: Office [facility] - [id number], [place], [place], [place] [address]   I discussed the limitations, risks, security and privacy concerns of performing an evaluation and management service by telephone and the availability of in person appointments. I also discussed with the patient that there may be a patient responsible charge related to this service. The patient expressed understanding and agreed to proceed.  Patient consented to consult via telephone: Yes People present and their role in pt care: Pt   History of Present Illness:  63 year old female former smoker followed in our office for COPD with an asthmatic component, upper airway irritation syndrome, chronic cough, and chronic respiratory failure  PMH: Obesity, hypertension, diastolic dysfunction Smoker/ Smoking History: Former smoker.  Quit 2016.  108 pack year smoker.  Maintenance: Trelegy Ellipta Pt of: [name]  Chief complaint: COPD follow up, oxygen level dropping   63 year old female former smoker followed in our office for COPD, chronic respiratory failure.  Patient contacted our office letting us know that she has had worsened breathing over the past week.  She believes this may be related to the weather.  She is had increased shortness of breath, has had to use her DuoNeb 2-3 times daily.  She is had increased wheezing.  She reports that she feels that her oxygen level may be dropping but she cannot remember what it had dropped to.  She is being assessed telephonically today and is not in our office.  She reports that she feels that her breathing is short of breath and she did wanted to get worse.  So she contacted our office.  She is having a productive cough with thick clear to white  mucus.  She reports that her baseline that she does not cough.  She reports adherence to her Trelegy Ellipta.  Observations/Objective:  11/17/2018-chest x-ray- no evidence of acute disease  08/23/2017-echocardiogram-LV ejection fraction 60 to 65%  11/16/2013-pulmonary function test- FVC 1.58 (67% predicted), postbronchodilator ratio 60, postbronchodilator FEV1 1.00 (53% predicted), no significant bronchodilator response, mid flow reversibility after administration of bronchodilator, DLCO 39  Immunization History  Administered Date(s) Administered  . Influenza Split 09/20/2011, 09/12/2012, 08/12/2013  . Influenza,inj,Quad PF,6+ Mos 08/03/2014, 08/12/2015, 08/12/2017, 07/29/2018  . Pneumococcal Conjugate-13 09/24/2017  . Pneumococcal Polysaccharide-23 11/12/2009   Assessment and Plan:  Chronic respiratory failure with hypoxia (HCC) Plan: Continue oxygen therapy as prescribed We will get you back into our office over the next 4 to 6 weeks to see [name]  COPD (chronic obstructive pulmonary disease) ([place]) Plan: Doxycycline today Prednisone taper today Continue Trelegy Ellipta Continue DuoNeb nebulized medication every 6-8 hours as needed for shortness of breath or wheezing Received flu vaccine from primary care You can receive your Pneumovax 23 at next office visit with [place]  COPD with acute exacerbation ([place]) Plan: Doxycycline Prednisone taper Need close follow-up with our office  Follow Up Instructions:  Return in about 6 weeks (around 09/24/2019), or if symptoms worsen or fail to improve, for Follow up with [name].   I discussed the assessment and treatment plan with the patient. The patient was provided an opportunity to ask questions and all were answered. The patient agreed with the plan and demonstrated an understanding of the instructions.   The patient was advised  to call back or seek an in-person evaluation if the symptoms worsen or if the condition fails to improve  as anticipated.  I provided 23 minutes of non-face-to-face time during this encounter.   Lauraine Rinne, NP

## 2019-08-13 NOTE — Patient Instructions (Addendum)
You were seen today by Lauraine Rinne, NP  for:   1. Chronic obstructive pulmonary disease, unspecified COPD type ([place])  Trelegy Ellipta  >>> 1 puff daily in the morning >>>rinse mouth out after use  >>> This inhaler contains 3 medications that help manage her respiratory status, contact our office if you cannot afford this medication or unable to remain on this medication  Continue DuoNeb nebulized medication every 6-8 hours as needed for shortness of breath and wheezing  Received flu vaccine from primary care as previously planned  You are due for your Pneumovax 23 we can provide this for you at your next office visit or you can receive it from primary care.  If you do receive from primary care please have this faxed to our office.  2. COPD with acute exacerbation (HCC)  Doxycycline >>> 1 100 mg tablet every 12 hours for 7 days >>>take with food  >>>wear sunscreen   Prednisone 10mg  tablet  >>>4 tabs for 2 days, then 3 tabs for 2 days, 2 tabs for 2 days, then 1 tab for 2 days, then stop >>>take with food  >>>take in the morning    3. Chronic respiratory failure with hypoxia (HCC)  Continue oxygen therapy as prescribed  >>>maintain oxygen saturations greater than 88 percent  >>>if unable to maintain oxygen saturations please contact the office  >>>do not smoke with oxygen  >>>can use nasal saline gel or nasal saline rinses to moisturize nose if oxygen causes dryness    We recommend today:  No orders of the defined types were placed in this encounter.  No orders of the defined types were placed in this encounter.  Meds ordered this encounter  Medications  . azithromycin (ZITHROMAX) 250 MG tablet    Sig: 500mg  (two tablets) today, then 250mg  (1 tablet) for the next 4 days    Dispense:  6 tablet    Refill:  0  . predniSONE (DELTASONE) 10 MG tablet    Sig: 4 tabs for 2 days, then 3 tabs for 2 days, 2 tabs for 2 days, then 1 tab for 2 days, then stop    Dispense:  20  tablet    Refill:  0    Follow Up:    Return in about 6 weeks (around 09/24/2019), or if symptoms worsen or fail to improve, for Follow up with [name].   Please do your part to reduce the spread of COVID-19:      Reduce your risk of any infection  and COVID19 by using the similar precautions used for avoiding the common cold or flu:  Marland Kitchen Wash your hands often with soap and warm water for at least 20 seconds.  If soap and water are not readily available, use an alcohol-based hand sanitizer with at least 60% alcohol.  . If coughing or sneezing, cover your mouth and nose by coughing or sneezing into the elbow areas of your shirt or coat, into a tissue or into your sleeve (not your hands). Langley Gauss A MASK when in public  . Avoid shaking hands with others and consider head nods or verbal greetings only. . Avoid touching your eyes, nose, or mouth with unwashed hands.  . Avoid close contact with people who are sick. . Avoid places or events with large numbers of people in one location, like concerts or sporting events. . If you have some symptoms but not all symptoms, continue to monitor at home and seek medical attention if your symptoms  worsen. . If you are having a medical emergency, call 911.   [place] / e-Visit: [email or web address]         [facility]: [facility]: [id number]                   [facility]: [id number]     It is flu season:   >>> Best ways to protect herself from the flu: Receive the yearly flu vaccine, practice good hand hygiene washing with soap and also using hand sanitizer when available, eat a nutritious meals, get adequate rest, hydrate appropriately   Please contact the office if your symptoms worsen or you have concerns that you are not improving.   Thank you for choosing [facility]  for your healthcare, and for allowing [place] to partner with you on your healthcare journey. I am thankful to be able to provide care to you today.   Wyn Quaker FNP-C

## 2019-08-13 NOTE — Telephone Encounter (Signed)
Pt televisit completed. Nothing further needed.  Stephanie Schultz

## 2019-08-13 NOTE — Assessment & Plan Note (Signed)
Plan: Doxycycline today Prednisone taper today Continue Trelegy Ellipta Continue DuoNeb nebulized medication every 6-8 hours as needed for shortness of breath or wheezing Received flu vaccine from primary care You can receive your Pneumovax 23 at next office visit with [place]

## 2019-08-13 NOTE — Telephone Encounter (Signed)
08/13/2019 0954  Triage, Please attempt to contact the patient.  I have left 2 voicemails for her she no showed to her tele-visit today.  Patient is due for an office visit.  Please schedule her accordingly with either [name] or an APP.  If patient is having acute symptoms and please schedule a virtual visit with an APP.  Can also schedule patient for a 4 to 6-week follow-up after that virtual visit with [name] so that way she can get on the schedule to see him as she has not seen him recently.  She is due for a follow-up.  Wyn Quaker, FNP

## 2019-08-13 NOTE — Assessment & Plan Note (Signed)
Plan: Continue oxygen therapy as prescribed We will get you back into our office over the next 4 to 6 weeks to see [name]

## 2019-08-13 NOTE — Patient Instructions (Signed)
We are unable to reach the patient.  Patient will need to be contacted and rescheduled with our office.  Wyn Quaker, FNP

## 2019-08-14 ENCOUNTER — Ambulatory Visit: Payer: Medicaid Other | Admitting: Dietician

## 2019-08-31 ENCOUNTER — Emergency Department (HOSPITAL_BASED_OUTPATIENT_CLINIC_OR_DEPARTMENT_OTHER): Payer: Medicaid Other

## 2019-08-31 ENCOUNTER — Encounter (HOSPITAL_COMMUNITY): Payer: Self-pay

## 2019-08-31 ENCOUNTER — Ambulatory Visit (INDEPENDENT_AMBULATORY_CARE_PROVIDER_SITE_OTHER): Payer: Medicaid Other | Admitting: Pulmonary Disease

## 2019-08-31 ENCOUNTER — Emergency Department (HOSPITAL_COMMUNITY): Payer: Medicaid Other

## 2019-08-31 ENCOUNTER — Encounter: Payer: Self-pay | Admitting: Pulmonary Disease

## 2019-08-31 ENCOUNTER — Telehealth: Payer: Self-pay | Admitting: Emergency Medicine

## 2019-08-31 ENCOUNTER — Other Ambulatory Visit: Payer: Self-pay

## 2019-08-31 ENCOUNTER — Emergency Department (HOSPITAL_COMMUNITY)
Admission: EM | Admit: 2019-08-31 | Discharge: 2019-08-31 | Disposition: A | Discharge status: Home / Self Care | Payer: Medicaid Other | Attending: Emergency Medicine | Admitting: Emergency Medicine

## 2019-08-31 DIAGNOSIS — Z720 Tobacco use: Secondary | ICD-10-CM | POA: Diagnosis not present

## 2019-08-31 DIAGNOSIS — E1140 Type 2 diabetes mellitus with diabetic neuropathy, unspecified: Secondary | ICD-10-CM | POA: Insufficient documentation

## 2019-08-31 DIAGNOSIS — J449 Chronic obstructive pulmonary disease, unspecified: Secondary | ICD-10-CM | POA: Insufficient documentation

## 2019-08-31 DIAGNOSIS — Z794 Long term (current) use of insulin: Secondary | ICD-10-CM | POA: Diagnosis not present

## 2019-08-31 DIAGNOSIS — Z7982 Long term (current) use of aspirin: Secondary | ICD-10-CM | POA: Insufficient documentation

## 2019-08-31 DIAGNOSIS — Z87891 Personal history of nicotine dependence: Secondary | ICD-10-CM | POA: Insufficient documentation

## 2019-08-31 DIAGNOSIS — Z79899 Other long term (current) drug therapy: Secondary | ICD-10-CM | POA: Insufficient documentation

## 2019-08-31 DIAGNOSIS — M7989 Other specified soft tissue disorders: Secondary | ICD-10-CM

## 2019-08-31 DIAGNOSIS — R0602 Shortness of breath: Secondary | ICD-10-CM

## 2019-08-31 DIAGNOSIS — I110 Hypertensive heart disease with heart failure: Secondary | ICD-10-CM | POA: Diagnosis not present

## 2019-08-31 DIAGNOSIS — J9611 Chronic respiratory failure with hypoxia: Secondary | ICD-10-CM

## 2019-08-31 DIAGNOSIS — I5032 Chronic diastolic (congestive) heart failure: Secondary | ICD-10-CM | POA: Diagnosis not present

## 2019-08-31 LAB — CBC WITH DIFFERENTIAL/PLATELET
Abs Immature Granulocytes: 0.02 10*3/uL (ref 0.00–0.07)
Basophils Absolute: 0 10*3/uL (ref 0.0–0.1)
Basophils Relative: 1 %
Eosinophils Absolute: 0.3 10*3/uL (ref 0.0–0.5)
Eosinophils Relative: 4 %
HCT: 34.7 % — ABNORMAL LOW (ref 36.0–46.0)
Hemoglobin: 11.5 g/dL — ABNORMAL LOW (ref 12.0–15.0)
Immature Granulocytes: 0 %
Lymphocytes Relative: 29 %
Lymphs Abs: 1.8 10*3/uL (ref 0.7–4.0)
MCH: 28.3 pg (ref 26.0–34.0)
MCHC: 33.1 g/dL (ref 30.0–36.0)
MCV: 85.3 fL (ref 80.0–100.0)
Monocytes Absolute: 0.6 10*3/uL (ref 0.1–1.0)
Monocytes Relative: 9 %
Neutro Abs: 3.5 10*3/uL (ref 1.7–7.7)
Neutrophils Relative %: 57 %
Platelets: 231 10*3/uL (ref 150–400)
RBC: 4.07 MIL/uL (ref 3.87–5.11)
RDW: 13.5 % (ref 11.5–15.5)
WBC: 6.2 10*3/uL (ref 4.0–10.5)
nRBC: 0 % (ref 0.0–0.2)

## 2019-08-31 LAB — BASIC METABOLIC PANEL
Anion gap: 9 (ref 5–15)
BUN: 10 mg/dL (ref 8–23)
CO2: 31 mmol/L (ref 22–32)
Calcium: 9.1 mg/dL (ref 8.9–10.3)
Chloride: 101 mmol/L (ref 98–111)
Creatinine, Ser: 1.02 mg/dL — ABNORMAL HIGH (ref 0.44–1.00)
GFR calc Af Amer: >60 mL/min (ref >60)
GFR calc non Af Amer: 59 mL/min — ABNORMAL LOW (ref >60)
Glucose, Bld: 127 mg/dL — ABNORMAL HIGH (ref 70–99)
Potassium: 3.7 mmol/L (ref 3.5–5.1)
Sodium: 141 mmol/L (ref 135–145)

## 2019-08-31 LAB — D-DIMER, QUANTITATIVE: D-Dimer, Quant: 0.68 ug/mL-FEU — ABNORMAL HIGH (ref 0.00–0.50)

## 2019-08-31 LAB — BRAIN NATRIURETIC PEPTIDE: B Natriuretic Peptide: 11.3 pg/mL (ref 0.0–100.0)

## 2019-08-31 LAB — TROPONIN I (HIGH SENSITIVITY)
Troponin I (High Sensitivity): 3 ng/L (ref <18)
Troponin I (High Sensitivity): 3 ng/L (ref <18)

## 2019-08-31 MED ORDER — IOHEXOL 350 MG/ML SOLN
100.0000 mL | Freq: Once | INTRAVENOUS | Status: AC | PRN
  Administered 2019-08-31: 19:00:00 100 mL via INTRAVENOUS

## 2019-08-31 MED ORDER — PREDNISONE 10 MG (21) PO TBPK: ORAL_TABLET | Freq: Every day | ORAL | 0 refills | Status: DC

## 2019-08-31 MED ORDER — SODIUM CHLORIDE (PF) 0.9 % IJ SOLN
INTRAMUSCULAR | Status: AC
  Filled 2019-08-31: qty 50

## 2019-08-31 NOTE — Assessment & Plan Note (Signed)
Plan: Continue to not smoke 

## 2019-08-31 NOTE — Telephone Encounter (Signed)
Called and spoke to patient. Patient stated she previously was given antibiotic and prednisone by NP and that helped but that symptoms have now resumed. Patient stated that she has a productive cough with sputum that is thick white/yellow, headache, left upper back hurts with inhaling, and that her oxygen level at times drops to the mid 80's when ambulating.  Patient stated she is worried about going to the hospital and would like to avoid that. Scheduled patient for telephone visit with NP.  Nothing further needed at this time.

## 2019-08-31 NOTE — Progress Notes (Signed)
Virtual Visit via Telephone Note  I connected with Stephanie Schultz on 08/31/19 at  2:30 PM EDT by telephone and verified that I am speaking with the correct person using two identifiers.  Location: Patient: Home Provider: [facility] - [id number], [place], [place], [place] [address]   I discussed the limitations, risks, security and privacy concerns of performing an evaluation and management service by telephone and the availability of in person appointments. I also discussed with the patient that there may be a patient responsible charge related to this service. The patient expressed understanding and agreed to proceed.  Patient consented to consult via telephone: Yes People present and their role in pt care: Pt   History of Present Illness:  63 year old female former smoker followed in our office for COPD with an asthmatic component, upper airway irritation syndrome, chronic cough, and chronic respiratory failure  PMH: Obesity, hypertension, diastolic dysfunction Smoker/ Smoking History: Former smoker.  Quit 2016.  108 pack year smoker.  Maintenance: Trelegy Ellipta Pt of: [name]  Chief complaint: Shortness of breath, Hypoxia    63 year old female former smoker followed in our office for COPD and chronic respiratory failure.  Patient contacted our office on 08/31/2019 reporting that she is having recurrent bouts of hypoxia as well as chest tightness and back pain.  She reports that the back pain is stabbing and started yesterday.  She is also having bilateral lower extremity swelling with increased left leg pain and swelling.  She reports that left leg swelling is greater than right leg swelling.  She has never had a history of pulmonary embolism or DVT.  She is not on any blood thinners.  She reports that despite using 3-1/2 L of O2 with physical exertion oxygen levels are dropping to 84 to 88%.  She is unable to come into our office today for a physical  evaluation as she does not have transportation.  She feels that she may need to go to the hospital but is trying to avoid that due to SARS-CoV-2 as well as long wait times in the emergency room.  She reports the back pain comes and goes but does worsen with physical exertion.  Wells Criteria Modified Wells criteria: Clinical assessment for PE Clinical symptoms of DVT (leg swelling, pain with palpation) - 3 - left leg pain Other diagnoses less likely than pulmonary embolism - 3 Heart rate greater than 100 - 1.5 Immobilization (disease greater in 3 days) or surgery in the previous 4 weeks - 1.5 - sedentary Previous DVT/PE - 1.5 Hemoptysis - 1 Malignancy - 1  Probability Traditional clinical probability assessment (Wells criteria) High - greater than 6 Moderate - 2 to 6 Low less than 2  Simplify clinical probability assessment (modified Wells criteria) PE likely-greater than 4 PE unlikely little less than or equal to 4    Observations/Objective:  08/31/2019 - SPO2 - 84-88 (3.5L)   11/17/2018-chest x-ray- no evidence of acute disease  08/23/2017-echocardiogram-LV ejection fraction 60 to 65%  11/16/2013-pulmonary function test- FVC 1.58 (67% predicted), postbronchodilator ratio 60, postbronchodilator FEV1 1.00 (53% predicted), no significant bronchodilator response, mid flow reversibility after administration of bronchodilator, DLCO 39  Assessment and Plan:  Chronic respiratory failure with hypoxia ([place]) Plan: Call 911 and present to the emergency room for further evaluation Continue oxygen therapy as prescribed   COPD (chronic obstructive pulmonary disease) ([place]) Recent treatment with doxycycline as well as a prednisone taper on 08/13/2019 Recurrent hypoxia with physical exertion  Plan:  Contact 911 and go to an emergency room for further evaluation Continue Trelegy Ellipta Continue DuoNeb nebulized medication every 6-8 hours as needed for shortness of breath or wheezing Keep  close follow-up with our office  Dyspnea Wells PE score 4.5 today Increased risk of developing pulmonary embolism or DVT due to morbid obesity as well as sedentary lifestyle Not maintained on any sort of anticoagulants or blood thinners Patient reporting left leg pain as well as increased left leg swelling, difficult to fully assess as this is a televisit Episodes of hypoxia as well as with back pain that worsens with physical exertion  Discussion: This is difficult to fully grasp and assess over a televisit.  Worse case scenario patient may have a pulmonary embolism or a DVT.  Patient needs baseline blood work as well as potentially CT imaging or lower extremity Dopplers.  Due to complexity of patient's care, difficulty with patient's transportation, active back pain and chest tightness, active hypoxia despite oxygen therapy I believe it is best for the patient present to the emergency room for further evaluation  Plan: Patient will contact 911 and present to an emergency room for further evaluation We will contact [facility] room to notify them that patient will be arriving via EMS and we have concerns for potential pulmonary embolism or DVT  Morbid obesity ([place]) Significantly elevated BMI Very sedentary lifestyle   Tobacco abuse Plan: Continue to not smoke   Follow Up Instructions:  Return in about 1 week (around 09/07/2019), or if symptoms worsen or fail to improve, for Follow up with Wyn Quaker FNP-C.   I discussed the assessment and treatment plan with the patient. The patient was provided an opportunity to ask questions and all were answered. The patient agreed with the plan and demonstrated an understanding of the instructions.   The patient was advised to call back or seek an in-person evaluation if the symptoms worsen or if the condition fails to improve as anticipated.  I provided 23 minutes of non-face-to-face time during this encounter.   Lauraine Rinne,  NP

## 2019-08-31 NOTE — ED Notes (Signed)
Patient transported to CT 

## 2019-08-31 NOTE — ED Triage Notes (Signed)
Per EMS, Progressively increased SOB for the last 2x weeks while moving. Pt has hx of COPD and CHF. PCP mentioned to pt that there is possibility of PE and pt wants to be checked out for rule out. Pt normally on 4L, and typically 90% o2 saturation.

## 2019-08-31 NOTE — ED Provider Notes (Signed)
[facility] DEPT Provider Note   CSN: [id number] Arrival date & time: 08/31/19  1625     History   Chief Complaint Chief Complaint  Patient presents with   Shortness of Breath    HPI Stephanie Schultz is a 63 y.o. female.     63 year old female with extensive past medical history including COPD on home oxygen, type 2 diabetes mellitus, dCHF, OSA, morbid obesity, HTN, HLD who presents with shortness of breath.  Patient states that 3 to 4 weeks ago she started noticing some intermittent problems with hypoxia and dyspnea during exertion.  She was also having head/sinus pressure.  She called the pulmonology clinic and they prescribed her azithromycin and a prednisone course.  She felt like her symptoms improved until approximately 1 week ago, when symptoms started again.  She reports some increased productive cough, no fevers.  Patient was seen today in her pulmonology clinic for shortness of breath with hypoxia as well as intermittent back pain.  She reported to the clinic that she was having intermittent stabbing back pain yesterday as well as increased L leg pain which she tells me she has had for "a while."  She clarifies to me that she is not having chest pain, and her back pain is on her left upper posterior shoulder blade, worse with coughing and palpation.  No fevers.  No recent weight gain.  The history is provided by the patient.  Shortness of Breath   Past Medical History:  Diagnosis Date   Anemia    chronis   Anxiety    Arthritis    Cervical cancer (HCC) 1990   cervical    Chronic diastolic CHF (congestive heart failure) ([place]) 08/10/2016   COPD (chronic obstructive pulmonary disease) ([place])    O2 dependent. Pulmo: [name]   Diabetes mellitus without complication ([place])    a. A1c 8.3 in 11/2015   Emphysema    Gallstones    s/p cholecystectomy   GERD (gastroesophageal reflux disease)    Gout    History of home oxygen  therapy    "2.5L; 24/7" (08/10/2016)   Hx of cardiac catheterization    a. LHC at [facility] in [place], [place] 09/2008:  Normal coronary arteries EF 70%. b. LHC 11/2016: normal cors, normal LVEDP, EF 55-65%.   Hyperlipidemia    Hypertension    Hypoxia 11/17/2018   Morbid obesity (HCC)    OSA on CPAP    CPAP at night    Pneumonia    Sickle cell trait ([place])    Tobacco abuse    a. up to 3ppd from age 62 to 43, now 1/4 ppd (01/2013) >> Quit 10/2015    Patient Active Problem List   Diagnosis Date Noted   COPD with acute exacerbation ([place]) 04/03/2019   Controlled type 2 diabetes mellitus with hyperglycemia, with long-term current use of insulin ([place]) 11/19/2018   Dyspnea 08/23/2017   Chronic diastolic CHF (congestive heart failure) ([place]) 08/10/2016   S/P laparoscopic cholecystectomy 12/13/2015   Chronic respiratory failure with hypoxia ([place]) 03/31/2015   Rib fracture 03/31/2015   Arthritis 12/13/2014   Gout 03/03/2014   Diabetic neuropathy ([place]) 03/03/2014   Carpal tunnel syndrome 03/03/2014   DM type 2 (diabetes mellitus, type 2) ([place]) 05/22/2013   Anxiety 05/22/2013   Transaminasemia 05/09/2013   Leukocytosis 04/27/2013   Tobacco abuse    Chest pain    Sickle cell trait (HCC)    Morbid obesity (HCC)    Diastolic dysfunction  01/16/2013   Hoarseness 12/22/2012   Epistaxis, recurrent 07/08/2012   GERD (gastroesophageal reflux disease) 09/20/2011   Pulmonary nodule 09/20/2011   COPD (chronic obstructive pulmonary disease) ([place]) 04/04/2011   Diabetes mellitus type 2, controlled ([place]) 04/04/2011   Hypertension 04/04/2011   Chronic allergic rhinitis 04/04/2011    Past Surgical History:  Procedure Laterality Date   BREAST BIOPSY     CARDIAC CATHETERIZATION     CARDIAC CATHETERIZATION N/A 12/03/2016   Procedure: Left Heart Cath and Coronary Angiography;  Surgeon: Peter M Martinique, MD;  Location: [facility];  Service: Cardiovascular;   Laterality: N/A;   CHOLECYSTECTOMY N/A 12/13/2015   Procedure: LAPAROSCOPIC CHOLECYSTECTOMY;  Surgeon: Ralene Ok, MD;  Location: [facility];  Service: General;  Laterality: N/A;   COLONOSCOPY  09/05/2012   Procedure: COLONOSCOPY;  Surgeon: Beryle Beams, MD;  Location: [facility];  Service: Endoscopy;  Laterality: N/A;   COLONOSCOPY WITH PROPOFOL N/A 09/02/2015   Procedure: COLONOSCOPY WITH PROPOFOL;  Surgeon: Carol Ada, MD;  Location: [facility];  Service: Endoscopy;  Laterality: N/A;   ESOPHAGOGASTRODUODENOSCOPY (EGD) WITH PROPOFOL N/A 03/29/2017   Procedure: ESOPHAGOGASTRODUODENOSCOPY (EGD) WITH PROPOFOL;  Surgeon: Carol Ada, MD;  Location: [facility];  Service: Endoscopy;  Laterality: N/A;   TUBAL LIGATION       OB History   No obstetric history on file.      Home Medications    Prior to Admission medications   Medication Sig Start Date End Date Taking? Authorizing Provider  allopurinol (ZYLOPRIM) 100 MG tablet Take 100 mg by mouth daily.   Yes [provider]  ALPRAZolam Duanne Moron) 1 MG tablet Take 1 mg by mouth at bedtime as needed for sleep.  04/13/16  Yes [provider]  aspirin EC 81 MG tablet Take 81 mg by mouth daily.   Yes [provider]  cetirizine (ZYRTEC) 10 MG tablet Take 10 mg by mouth daily. 08/14/19  Yes [provider]  Cholecalciferol (VITAMIN D PO) Take 1 tablet by mouth daily.   Yes [provider]  clobetasol cream (TEMOVATE) [id number] % Apply 1 application topically daily as needed (irritation from pull ups).  03/25/17  Yes [provider]  esomeprazole (NEXIUM) 40 MG capsule Take 40 mg by mouth daily at 12 noon.   Yes [provider]  fluticasone (FLONASE) 50 MCG/ACT nasal spray Place 1 spray into both nostrils daily as needed (congestion). 08/26/17  Yes Aline August, MD  furosemide (LASIX) 40 MG tablet TAKE 1 TABLET IN THE AM AND 1/2 TABLET IN PM. Patient taking differently: Take 20-40  mg by mouth 2 (two) times daily. Take 40mg  in AM and 20mg  in PM 08/10/19  Yes Turner, Traci R, MD  glipiZIDE (GLUCOTROL) 10 MG tablet Take 10 mg by mouth 2 (two) times daily before a meal.    Yes [provider]  HYDROcodone-acetaminophen (NORCO) 7.5-325 MG tablet Take 1 tablet by mouth every 6 (six) hours as needed for moderate pain.   Yes [provider]  insulin glargine (LANTUS) 100 unit/mL SOPN Inject 18 Units into the skin at bedtime.    Yes [provider]  insulin lispro (HUMALOG) 100 UNIT/ML injection Inject 6-10 Units into the skin once. 6units for sugar over 200 10 units for sugar over 300   Yes [provider]  ipratropium-albuterol (DUONEB) 0.5-2.5 (3) MG/3ML SOLN Take 3 mLs by nebulization every 6 (six) hours as needed (wheezing, shortness of breath). 11/19/18  Yes Dhungel, Flonnie Overman, MD  Linaclotide (  LINZESS) 145 MCG CAPS capsule Take 145 mcg by mouth daily.    Yes [provider]  meclizine (ANTIVERT) 25 MG tablet Take 25 mg by mouth every 6 (six) hours as needed. 05/21/19  Yes [provider]  Melatonin 2.5 MG CHEW Chew 2.5 mg by mouth See admin instructions. Only takes when waking up in middle of the night   Yes [provider]  metoprolol succinate (TOPROL-XL) 50 MG 24 hr tablet TAKE 1 TABLET DAILY. TAKE WITH A MEAL OR IMMEDIATELY FOLLOWING A MEAL. Patient taking differently: Take 50 mg by mouth daily.  08/12/19  Yes Rosita Fire, Brittainy M, PA-C  ondansetron (ZOFRAN ODT) 4 MG disintegrating tablet Take 1 tablet (4 mg total) by mouth every 8 (eight) hours as needed for nausea or vomiting. 03/01/17  Yes Plunkett, Whitney, MD  PAZEO 0.7 % SOLN Place 1 drop into both eyes at bedtime. 08/14/19  Yes [provider]  Potassium Chloride ER 20 MEQ TBCR Take 20 mEq by mouth daily. 12/04/16  Yes Arbutus Leas, NP  PROAIR HFA 108 (90 Base) MCG/ACT inhaler INHALE 2 PUFFS BY MOUTH EVERY 4 HOURS AS NEEDED FOR WHEEZING Patient taking  differently: Inhale 2 puffs into the lungs every 4 (four) hours as needed for wheezing or shortness of breath.  06/14/16  Yes Collene Gobble, MD  sitaGLIPtin (JANUVIA) 100 MG tablet Take 100 mg by mouth daily.   Yes [provider]  spironolactone (ALDACTONE) 25 MG tablet Take 0.5 tablets (12.5 mg total) by mouth daily. 12/05/16  Yes Jettie Booze E, NP  TRELEGY ELLIPTA 100-62.5-25 MCG/INH AEPB INHALE 1 PUFF INTO THE LUNGS ONCE DAILY AS DIRECTED. Patient taking differently: Inhale 1 puff into the lungs daily.  07/07/19  Yes Collene Gobble, MD  zolpidem (AMBIEN) 10 MG tablet Take 10 mg by mouth at bedtime.   Yes [provider]  OXYGEN Inhale 2.5 L into the lungs continuous.     [provider]  potassium chloride SA (K-DUR) 20 MEQ tablet TAKE 1 TABLET IN THE MORNING. Patient taking differently: Take 20 mEq by mouth daily.  08/10/19   Sueanne Margarita, MD  predniSONE (STERAPRED UNI-PAK 21 TAB) 10 MG (21) TBPK tablet Take by mouth daily. Take 6 tabs by mouth daily  for 2 days, then 5 tabs for 2 days, then 4 tabs for 2 days, then 3 tabs for 2 days, 2 tabs for 2 days, then 1 tab by mouth daily for 2 days 08/31/19   Armoni Kludt, Wenda Overland, MD  Respiratory Therapy Supplies (FLUTTER) DEVI 1 Device by Does not apply route 3 (three) times daily. 12/03/18   Fenton Foy, NP    Family History Family History  Problem Relation Age of Onset   Other Father        unaware of father's medical history   Diabetes Mother        alive @ 45   Myasthenia gravis Mother    Lung cancer Paternal Aunt    Lung cancer Paternal Grandfather    Other Other        multiple siblings a&w.   Heart attack Neg Hx    Heart failure Neg Hx    Breast cancer Neg Hx     Social History Social History   Tobacco Use   Smoking status: Former Smoker    Packs/day: 3.00    Years: 36.00    Pack years: 108.00    Types: Cigarettes    Quit date: 11/11/2015  Years since quitting: 3.8   Smokeless  tobacco: Never Used   Tobacco comment: Approx 90 pk-yrs (up to 3ppd until ~ 2009). Smoking 3 cigs per day now.  Substance Use Topics   Alcohol use: No    Alcohol/week: 0.0 standard drinks   Drug use: No     Allergies   Acetaminophen   Review of Systems Review of Systems  Respiratory: Positive for shortness of breath.    All other systems reviewed and are negative except that which was mentioned in HPI   Physical Exam Updated Vital Signs BP 110/83    Pulse 86    Temp 98.6 F (37 C) (Oral)    Resp 19    Ht 5' (1.524 m)    Wt 97.5 kg    LMP 06/04/2000 (LMP Unknown) Comment: tubal ligation   SpO2 100%    BMI 41.99 kg/m   Physical Exam Vitals signs and nursing note reviewed.  Constitutional:      General: She is not in acute distress.    Appearance: She is well-developed. She is obese.  HENT:     Head: Normocephalic and atraumatic.  Eyes:     Conjunctiva/sclera: Conjunctivae normal.  Neck:     Musculoskeletal: Neck supple.  Cardiovascular:     Rate and Rhythm: Normal rate and regular rhythm.     Heart sounds: Normal heart sounds. No murmur.  Pulmonary:     Effort: Pulmonary effort is normal.     Comments: Diminished BS b/l, no wheezing Abdominal:     General: Bowel sounds are normal. There is no distension.     Palpations: Abdomen is soft.     Tenderness: There is no abdominal tenderness.  Musculoskeletal:     Right lower leg: No edema.     Left lower leg: No edema.     Comments: Tenderness L thoracic paraspinal muscles near scapula  Skin:    General: Skin is warm and dry.  Neurological:     Mental Status: She is alert and oriented to person, place, and time.     Comments: Fluent speech  Psychiatric:        Judgment: Judgment normal.     ED Treatments / Results  Labs (all labs ordered are listed, but only abnormal results are displayed) Labs Reviewed  BASIC METABOLIC PANEL - Abnormal; Notable for the following components:      Result Value   Glucose,  Bld 127 (*)    Creatinine, Ser 1.02 (*)    GFR calc non Af Amer 59 (*)    All other components within normal limits  CBC WITH DIFFERENTIAL/PLATELET - Abnormal; Notable for the following components:   Hemoglobin 11.5 (*)    HCT 34.7 (*)    All other components within normal limits  D-DIMER, QUANTITATIVE (NOT AT [facility]) - Abnormal; Notable for the following components:   D-Dimer, Quant 0.68 (*)    All other components within normal limits  BRAIN NATRIURETIC PEPTIDE  TROPONIN I (HIGH SENSITIVITY)  TROPONIN I (HIGH SENSITIVITY)    EKG EKG Interpretation  Date/Time:  Monday August 31 2019 16:55:35 EDT Ventricular Rate:  65 PR Interval:    QRS Duration: 87 QT Interval:  454 QTC Calculation: 473 R Axis:   54 Text Interpretation:  Sinus rhythm Low voltage, precordial leads No significant change since last tracing Confirmed by Theotis Burrow [phone]) on 08/31/2019 5:09:00 PM   Radiology Dg Chest 2 View  Result Date: 08/31/2019 CLINICAL DATA:  Worsening shortness of breath  for 2 weeks. Hypoxia. Chest discomfort. EXAM: CHEST - 2 VIEW COMPARISON:  11/17/2018 FINDINGS: The heart size and mediastinal contours are within normal limits. Aortic atherosclerosis. Mild scarring noted in left lung base. Lungs are otherwise clear. Several old left rib fracture deformities again noted. IMPRESSION: No active cardiopulmonary disease. Electronically Signed   By: Marlaine Hind M.D.   On: 08/31/2019 18:35   Ct Angio Chest Pe W/cm &/or Wo Cm  Result Date: 08/31/2019 CLINICAL DATA:  Shortness of breath EXAM: CT ANGIOGRAPHY CHEST WITH CONTRAST TECHNIQUE: Multidetector CT imaging of the chest was performed using the standard protocol during bolus administration of intravenous contrast. Multiplanar CT image reconstructions and MIPs were obtained to evaluate the vascular anatomy. CONTRAST:  164mL OMNIPAQUE IOHEXOL 350 MG/ML SOLN COMPARISON:  Chest x-ray 08/31/2019, CT chest 03/06/2015 FINDINGS: Cardiovascular:  Satisfactory opacification of the pulmonary arteries to the segmental level. Respiratory motion artifact limits evaluation for distal PE within the lower lobes. No acute central or segmental filling defect is seen. Mild aortic atherosclerosis without aneurysm. Heart size upper normal. No pericardial effusion Mediastinum/Nodes: No enlarged mediastinal, hilar, or axillary lymph nodes. Thyroid gland, trachea, and esophagus demonstrate no significant findings. Lungs/Pleura: Emphysema. No acute consolidation, pleural effusion, or pneumothorax. Upper Abdomen: Hepatic steatosis. Mild reflux of contrast into the IVC and hepatic veins suggesting elevated right heart pressure. Musculoskeletal: Degenerative changes without acute or suspicious osseous lesion Review of the MIP images confirms the above findings. IMPRESSION: 1. Negative for acute pulmonary embolus. 2. Emphysema without acute airspace disease. Aortic Atherosclerosis (ICD10-I70.0) and Emphysema (ICD10-J43.9). Electronically Signed   By: Donavan Foil M.D.   On: 08/31/2019 19:56   Vas [place] Lower Extremity Venous (dvt) (mc And Wl 7a-7p)  Result Date: 08/31/2019  Lower Venous Study Indications: Swelling.  Limitations: Body habitus. Comparison Study: No prior study. Performing Technologist: Maudry Mayhew MHA, RDMS, RVT, RDCS  Examination Guidelines: A complete evaluation includes B-mode imaging, spectral Doppler, color Doppler, and power Doppler as needed of all accessible portions of each vessel. Bilateral testing is considered an integral part of a complete examination. Limited examinations for reoccurring indications may be performed as noted.  +---------+---------------+---------+-----------+----------+--------------+  LEFT      Compressibility Phasicity Spontaneity Properties Thrombus Aging  +---------+---------------+---------+-----------+----------+--------------+  CFV                                                        Not visualized   +---------+---------------+---------+-----------+----------+--------------+  SFJ                                                        Not visualized  +---------+---------------+---------+-----------+----------+--------------+  FV Prox                                                    Not visualized  +---------+---------------+---------+-----------+----------+--------------+  FV Mid    Full                                                             +---------+---------------+---------+-----------+----------+--------------+  FV Distal Full                                                             +---------+---------------+---------+-----------+----------+--------------+  PFV                                                        Not visualized  +---------+---------------+---------+-----------+----------+--------------+  POP       Full            No        Yes                                    +---------+---------------+---------+-----------+----------+--------------+  PTV       Full                                                             +---------+---------------+---------+-----------+----------+--------------+  PERO      Full                                                             +---------+---------------+---------+-----------+----------+--------------+  Summary: Left: There is no evidence of deep vein thrombosis in the lower extremity. However, portions of this examination were limited- see technologist comments above. No cystic structure found in the popliteal fossa. Pulsatile lower extremity venous flow is suggestive of possible elevated right heart pressure.  *See table(s) above for measurements and observations.    Preliminary     Procedures Procedures (including critical care time)  Medications Ordered in ED Medications  sodium chloride (PF) 0.9 % injection (has no administration in time range)  iohexol (OMNIPAQUE) 350 MG/ML injection 100 mL (100 mLs Intravenous Contrast Given  08/31/19 1928)     Initial Impression / Assessment and Plan / ED Course  I have reviewed the triage vital signs and the nursing notes.  Pertinent labs & imaging results that were available during my care of the patient were reviewed by me and considered in my medical decision making (see chart for details).       Pleasant and well-appearing on exam, 100% on home oxygen level.  No obvious signs of volume overload on physical exam.  Ultrasound negative for DVT.  Lab work shows normal troponin, reassuring BMP and CBC, D-dimer mildly elevated at 0.6 today.  Obtained CTA which was negative for PE and showed no infiltrate or other acute findings.  Because patient states that her symptoms had initially improved after steroid course, I suspect she may be having rebound COPD exacerbation.  Provided with another steroid course and instructed to follow-up with pulmonology clinic.  Because of reassuring lab work and CT of chest, I do not feel  she needs antibiotics currently.  Patient to continue scheduled breathing treatments.  I have extensively reviewed return precautions with her and she voiced understanding.  Final Clinical Impressions(s) / ED Diagnoses   Final diagnoses:  SOB (shortness of breath)    ED Discharge Orders         Ordered    predniSONE (STERAPRED UNI-PAK 21 TAB) 10 MG (21) TBPK tablet  Daily     08/31/19 2115           Brooklee Michelin, Wenda Overland, MD 09/01/19 [phone]

## 2019-08-31 NOTE — ED Notes (Signed)
Still no return of oxygen tank by EMS. Judeth Porch NT, called dispatch again to inquire about status of oxygen tank. Dispatch stated they would send a BLS truck to deliver the oxygen tank to pt.

## 2019-08-31 NOTE — Assessment & Plan Note (Signed)
Wells PE score 4.5 today Increased risk of developing pulmonary embolism or DVT due to morbid obesity as well as sedentary lifestyle Not maintained on any sort of anticoagulants or blood thinners Patient reporting left leg pain as well as increased left leg swelling, difficult to fully assess as this is a televisit Episodes of hypoxia as well as with back pain that worsens with physical exertion  Discussion: This is difficult to fully grasp and assess over a televisit.  Worse case scenario patient may have a pulmonary embolism or a DVT.  Patient needs baseline blood work as well as potentially CT imaging or lower extremity Dopplers.  Due to complexity of patient's care, difficulty with patient's transportation, active back pain and chest tightness, active hypoxia despite oxygen therapy I believe it is best for the patient present to the emergency room for further evaluation  Plan: Patient will contact 911 and present to an emergency room for further evaluation We will contact [facility] room to notify them that patient will be arriving via EMS and we have concerns for potential pulmonary embolism or DVT

## 2019-08-31 NOTE — Assessment & Plan Note (Addendum)
Plan: Call 911 and present to the emergency room for further evaluation Continue oxygen therapy as prescribed

## 2019-08-31 NOTE — Patient Instructions (Addendum)
You were seen today by Lauraine Rinne, NP  for:   1. Shortness of breath   2. Chronic obstructive pulmonary disease, unspecified COPD type ([place])   3. Chronic respiratory failure with hypoxia (HCC)   4. Tobacco abuse   5. Morbid obesity ([place])   Please contact 911 and present to the emergency room for further evaluation   Follow Up:    Return in about 1 week (around 09/07/2019), or if symptoms worsen or fail to improve, for Follow up with Wyn Quaker FNP-C.   Please do your part to reduce the spread of COVID-19:      Reduce your risk of any infection  and COVID19 by using the similar precautions used for avoiding the common cold or flu:  Marland Kitchen Wash your hands often with soap and warm water for at least 20 seconds.  If soap and water are not readily available, use an alcohol-based hand sanitizer with at least 60% alcohol.  . If coughing or sneezing, cover your mouth and nose by coughing or sneezing into the elbow areas of your shirt or coat, into a tissue or into your sleeve (not your hands). Langley Gauss A MASK when in public  . Avoid shaking hands with others and consider head nods or verbal greetings only. . Avoid touching your eyes, nose, or mouth with unwashed hands.  . Avoid close contact with people who are sick. . Avoid places or events with large numbers of people in one location, like concerts or sporting events. . If you have some symptoms but not all symptoms, continue to monitor at home and seek medical attention if your symptoms worsen. . If you are having a medical emergency, call 911.   [place] / e-Visit: [email or web address]         [facility]: [facility]: [id number]                   [facility]: [id number]     It is flu season:   >>> Best ways to protect herself from the flu: Receive the yearly flu  vaccine, practice good hand hygiene washing with soap and also using hand sanitizer when available, eat a nutritious meals, get adequate rest, hydrate appropriately   Please contact the office if your symptoms worsen or you have concerns that you are not improving.   Thank you for choosing [facility] for your healthcare, and for allowing [place] to partner with you on your healthcare journey. I am thankful to be able to provide care to you today.   Wyn Quaker FNP-C

## 2019-08-31 NOTE — Assessment & Plan Note (Signed)
Significantly elevated BMI Very sedentary lifestyle

## 2019-08-31 NOTE — ED Notes (Signed)
Pt expressed that her oxygen tank was not brought in by EMS, and would not be able to return home in a care ride without it. Called EMS dispatch, dispatch said they would send a message to the trucks to return the oxygen.

## 2019-08-31 NOTE — Progress Notes (Signed)
Left lower extremity venous duplex completed. Refer to "CV Proc" under chart review to view preliminary results.  08/31/2019 5:46 PM Maudry Mayhew, MHA, RVT, RDCS, RDMS

## 2019-08-31 NOTE — Assessment & Plan Note (Signed)
Recent treatment with doxycycline as well as a prednisone taper on 08/13/2019 Recurrent hypoxia with physical exertion  Plan: Contact 911 and go to an emergency room for further evaluation Continue Trelegy Ellipta Continue DuoNeb nebulized medication every 6-8 hours as needed for shortness of breath or wheezing Keep close follow-up with our office

## 2019-09-03 ENCOUNTER — Telehealth: Payer: Self-pay | Admitting: Pulmonary Disease

## 2019-09-03 NOTE — Telephone Encounter (Signed)
09/03/2019 1031  Patient was recently discharged to the emergency room with a prednisone taper after evaluation for worsening shortness of breath.  Can we get the patient scheduled for an emergency room follow-up sometime over the next 2 weeks.  This can be in office preferably if the patient cannot coordinate transportation then can be made as a televisit.  Wyn Quaker, FNP

## 2019-09-04 NOTE — Telephone Encounter (Signed)
Left message for patient to call back  

## 2019-09-08 NOTE — Telephone Encounter (Signed)
Pt called back and scheduled her w/Brian 11/3.

## 2019-09-09 ENCOUNTER — Other Ambulatory Visit: Payer: Self-pay | Admitting: Emergency Medicine

## 2019-09-09 NOTE — Telephone Encounter (Signed)
Noted.  Will close encounter.  

## 2019-09-14 NOTE — Progress Notes (Deleted)
@Patient  ID: Stephanie Schultz, female    DOB: 10/08/1956, 63 y.o.   MRN: [id number]  No chief complaint on file.   Referring provider: [name], MD  HPI:  63 year old female former smoker followed in our office for COPD with an asthmatic component, upper airway irritation syndrome, chronic cough, and chronic respiratory failure  PMH: Obesity, hypertension, diastolic dysfunction Smoker/ Smoking History: Former smoker.  Quit 2016.  108 pack year smoker.  Maintenance: Trelegy Ellipta Pt of: [name]  09/14/2019  - Visit     Questionaires / Pulmonary Flowsheets:   MMRC: No flowsheet data found.  Tests:   11/17/2018-chest x-ray- no evidence of acute disease  08/23/2017-echocardiogram-LV ejection fraction 60 to 65%  11/16/2013-pulmonary function test- FVC 1.58 (67% predicted), postbronchodilator ratio 60, postbronchodilator FEV1 1.00 (53% predicted), no significant bronchodilator response, mid flow reversibility after administration of bronchodilator, DLCO 39   FENO:  No results found for: NITRICOXIDE  PFT: PFT Results Latest Ref Rng & Units 01/12/2019 11/16/2013  FVC-Pre L - 1.58  FVC-Predicted Pre % 51 67  FVC-Post L 1.18 1.66  FVC-Predicted Post % 52 70  Pre FEV1/FVC % % 52 59  Post FEV1/FCV % % 56 60  FEV1-Pre L 0.59 0.94  FEV1-Predicted Pre % 33 50  FEV1-Post L 0.66 1.00  DLCO UNC% % 33 39  DLCO COR %Predicted % 60 61  TLC L 6.71 3.60  TLC % Predicted % 150 80  RV % Predicted % 254 109    WALK:  SIX MIN WALK 12/18/2018 04/06/2016 01/06/2014  Supplimental Oxygen during Test? (L/min) Yes Yes No  O2 Flow Rate 2 2 -  Type Continuous - -  Tech Comments: sat dropped to 85% on 2L;86% on 3L;91% on 4L Pt's lowest ambulatory sat was 87 on 2L and 92 on 3L. Pt walked at a slower pace with several breaks. Pt did not complete SATS dropping 87%    Imaging: Dg Chest 2 View  Result Date: 08/31/2019 CLINICAL DATA:  Worsening shortness of breath for 2 weeks.  Hypoxia. Chest discomfort. EXAM: CHEST - 2 VIEW COMPARISON:  11/17/2018 FINDINGS: The heart size and mediastinal contours are within normal limits. Aortic atherosclerosis. Mild scarring noted in left lung base. Lungs are otherwise clear. Several old left rib fracture deformities again noted. IMPRESSION: No active cardiopulmonary disease. Electronically Signed   By: Marlaine Hind M.D.   On: 08/31/2019 18:35   Ct Angio Chest Pe W/cm &/or Wo Cm  Result Date: 08/31/2019 CLINICAL DATA:  Shortness of breath EXAM: CT ANGIOGRAPHY CHEST WITH CONTRAST TECHNIQUE: Multidetector CT imaging of the chest was performed using the standard protocol during bolus administration of intravenous contrast. Multiplanar CT image reconstructions and MIPs were obtained to evaluate the vascular anatomy. CONTRAST:  157mL OMNIPAQUE IOHEXOL 350 MG/ML SOLN COMPARISON:  Chest x-ray 08/31/2019, CT chest 03/06/2015 FINDINGS: Cardiovascular: Satisfactory opacification of the pulmonary arteries to the segmental level. Respiratory motion artifact limits evaluation for distal PE within the lower lobes. No acute central or segmental filling defect is seen. Mild aortic atherosclerosis without aneurysm. Heart size upper normal. No pericardial effusion Mediastinum/Nodes: No enlarged mediastinal, hilar, or axillary lymph nodes. Thyroid gland, trachea, and esophagus demonstrate no significant findings. Lungs/Pleura: Emphysema. No acute consolidation, pleural effusion, or pneumothorax. Upper Abdomen: Hepatic steatosis. Mild reflux of contrast into the IVC and hepatic veins suggesting elevated right heart pressure. Musculoskeletal: Degenerative changes without acute or suspicious osseous lesion Review of the MIP images confirms the above findings. IMPRESSION: 1.  Negative for acute pulmonary embolus. 2. Emphysema without acute airspace disease. Aortic Atherosclerosis (ICD10-I70.0) and Emphysema (ICD10-J43.9). Electronically Signed   By: Donavan Foil M.D.    On: 08/31/2019 19:56   Vas [place] Lower Extremity Venous (dvt) (mc And Wl 7a-7p)  Result Date: 09/01/2019  Lower Venous Study Indications: Swelling.  Limitations: Body habitus. Comparison Study: No prior study. Performing Technologist: Maudry Mayhew MHA, RDMS, RVT, RDCS  Examination Guidelines: A complete evaluation includes B-mode imaging, spectral Doppler, color Doppler, and power Doppler as needed of all accessible portions of each vessel. Bilateral testing is considered an integral part of a complete examination. Limited examinations for reoccurring indications may be performed as noted.  +---------+---------------+---------+-----------+----------+--------------+  LEFT      Compressibility Phasicity Spontaneity Properties Thrombus Aging  +---------+---------------+---------+-----------+----------+--------------+  CFV                                                        Not visualized  +---------+---------------+---------+-----------+----------+--------------+  SFJ                                                        Not visualized  +---------+---------------+---------+-----------+----------+--------------+  FV Prox                                                    Not visualized  +---------+---------------+---------+-----------+----------+--------------+  FV Mid    Full                                                             +---------+---------------+---------+-----------+----------+--------------+  FV Distal Full                                                             +---------+---------------+---------+-----------+----------+--------------+  PFV                                                        Not visualized  +---------+---------------+---------+-----------+----------+--------------+  POP       Full            No        Yes                                    +---------+---------------+---------+-----------+----------+--------------+  PTV       Full                                                              +---------+---------------+---------+-----------+----------+--------------+  PERO      Full                                                             +---------+---------------+---------+-----------+----------+--------------+  Summary: Left: There is no evidence of deep vein thrombosis in the lower extremity. However, portions of this examination were limited- see technologist comments above. No cystic structure found in the popliteal fossa. Pulsatile lower extremity venous flow is suggestive of possible elevated right heart pressure.  *See table(s) above for measurements and observations. Electronically signed by Ruta Hinds MD on 09/01/2019 at 11:15:36 AM.    Final     Lab Results:  CBC    Component Value Date/Time   WBC 6.2 08/31/2019 1800   RBC 4.07 08/31/2019 1800   HGB 11.5 (L) 08/31/2019 1800   HGB 11.8 11/30/2016 1301   HCT 34.7 (L) 08/31/2019 1800   HCT 34.5 11/30/2016 1301   PLT 231 08/31/2019 1800   PLT 254 11/30/2016 1301   MCV 85.3 08/31/2019 1800   MCV 82 11/30/2016 1301   MCH 28.3 08/31/2019 1800   MCHC 33.1 08/31/2019 1800   RDW 13.5 08/31/2019 1800   RDW 15.5 (H) 11/30/2016 1301   LYMPHSABS 1.8 08/31/2019 1800   MONOABS 0.6 08/31/2019 1800   EOSABS 0.3 08/31/2019 1800   BASOSABS 0.0 08/31/2019 1800    BMET    Component Value Date/Time   NA 141 08/31/2019 1800   NA 141 12/11/2018 1005   K 3.7 08/31/2019 1800   CL 101 08/31/2019 1800   CO2 31 08/31/2019 1800   GLUCOSE 127 (H) 08/31/2019 1800   BUN 10 08/31/2019 1800   BUN 13 12/11/2018 1005   CREATININE 1.02 (H) 08/31/2019 1800   CALCIUM 9.1 08/31/2019 1800   GFRNONAA 59 (L) 08/31/2019 1800   GFRAA >60 08/31/2019 1800    BNP    Component Value Date/Time   BNP 11.3 08/31/2019 1800    ProBNP    Component Value Date/Time   PROBNP 16.1 07/05/2014 1230    Specialty Problems      Pulmonary Problems   Chronic allergic rhinitis   COPD (chronic obstructive pulmonary  disease) ([place])    11/16/2013-pulmonary function test- FVC 1.58 (67% predicted), postbronchodilator ratio 60, postbronchodilator FEV1 1.00 (53% predicted), no significant bronchodilator response, mid flow reversibility after administration of bronchodilator, DLCO 39      Pulmonary nodule    Per pt' s report, CXR from [name] showed " a spot"      Epistaxis, recurrent   Chronic respiratory failure with hypoxia ([place])   Dyspnea   COPD with acute exacerbation (HCC)      Allergies  Allergen Reactions   Acetaminophen Other (See Comments)    Pt was told that she could not take Tylenol, does not know why.    Immunization History  Administered Date(s) Administered   Influenza Split 09/20/2011, 09/12/2012, 08/12/2013   Influenza,inj,Quad PF,6+ Mos 08/03/2014, 08/12/2015, 08/12/2017, 07/29/2018   Pneumococcal Conjugate-13 09/24/2017   Pneumococcal Polysaccharide-23 11/12/2009    Past Medical History:  Diagnosis Date   Anemia    chronis   Anxiety    Arthritis    Cervical cancer ([place]) 1990   cervical    Chronic diastolic CHF (congestive heart failure) ([place]) 08/10/2016   COPD (chronic obstructive pulmonary disease) (  [place])    O2 dependent. Pulmo: [name]   Diabetes mellitus without complication ([place])    a. A1c 8.3 in 11/2015   Emphysema    Gallstones    s/p cholecystectomy   GERD (gastroesophageal reflux disease)    Gout    History of home oxygen therapy    "2.5L; 24/7" (08/10/2016)   Hx of cardiac catheterization    a. LHC at [facility] in [place], [place] 09/2008:  Normal coronary arteries EF 70%. b. LHC 11/2016: normal cors, normal LVEDP, EF 55-65%.   Hyperlipidemia    Hypertension    Hypoxia 11/17/2018   Morbid obesity (HCC)    OSA on CPAP    CPAP at night    Pneumonia    Sickle cell trait ([place])    Tobacco abuse    a. up to 3ppd from age 43 to 41, now 1/4 ppd (01/2013) >> Quit 10/2015    Tobacco History: Social History   Tobacco Use  Smoking Status  Former Smoker   Packs/day: 3.00   Years: 36.00   Pack years: 108.00   Types: Cigarettes   Quit date: 11/11/2015   Years since quitting: 3.8  Smokeless Tobacco Never Used  Tobacco Comment   Approx 90 pk-yrs (up to 3ppd until ~ 2009). Smoking 3 cigs per day now.   Counseling given: Not Answered Comment: Approx 90 pk-yrs (up to 3ppd until ~ 2009). Smoking 3 cigs per day now.   Continue to not smoke  Outpatient Encounter Medications as of 09/15/2019  Medication Sig   allopurinol (ZYLOPRIM) 100 MG tablet Take 100 mg by mouth daily.   ALPRAZolam (XANAX) 1 MG tablet Take 1 mg by mouth at bedtime as needed for sleep.    aspirin EC 81 MG tablet Take 81 mg by mouth daily.   cetirizine (ZYRTEC) 10 MG tablet Take 10 mg by mouth daily.   Cholecalciferol (VITAMIN D PO) Take 1 tablet by mouth daily.   clobetasol cream (TEMOVATE) [id number] % Apply 1 application topically daily as needed (irritation from pull ups).    esomeprazole (NEXIUM) 40 MG capsule Take 40 mg by mouth daily at 12 noon.   fluticasone (FLONASE) 50 MCG/ACT nasal spray Place 1 spray into both nostrils daily as needed (congestion).   furosemide (LASIX) 40 MG tablet TAKE 1 TABLET IN THE AM AND 1/2 TABLET IN PM. (Patient taking differently: Take 20-40 mg by mouth 2 (two) times daily. Take 40mg  in AM and 20mg  in PM)   glipiZIDE (GLUCOTROL) 10 MG tablet Take 10 mg by mouth 2 (two) times daily before a meal.    HYDROcodone-acetaminophen (NORCO) 7.5-325 MG tablet Take 1 tablet by mouth every 6 (six) hours as needed for moderate pain.   insulin glargine (LANTUS) 100 unit/mL SOPN Inject 18 Units into the skin at bedtime.    insulin lispro (HUMALOG) 100 UNIT/ML injection Inject 6-10 Units into the skin once. 6units for sugar over 200 10 units for sugar over 300   ipratropium-albuterol (DUONEB) 0.5-2.5 (3) MG/3ML SOLN Take 3 mLs by nebulization every 6 (six) hours as needed (wheezing, shortness of breath).   Linaclotide  (LINZESS) 145 MCG CAPS capsule Take 145 mcg by mouth daily.    meclizine (ANTIVERT) 25 MG tablet Take 25 mg by mouth every 6 (six) hours as needed.   Melatonin 2.5 MG CHEW Chew 2.5 mg by mouth See admin instructions. Only takes when waking up in middle of the night   metoprolol succinate (TOPROL-XL) 50 MG 24 hr tablet  TAKE 1 TABLET DAILY. TAKE WITH A MEAL OR IMMEDIATELY FOLLOWING A MEAL. (Patient taking differently: Take 50 mg by mouth daily. )   ondansetron (ZOFRAN ODT) 4 MG disintegrating tablet Take 1 tablet (4 mg total) by mouth every 8 (eight) hours as needed for nausea or vomiting.   OXYGEN Inhale 2.5 L into the lungs continuous.    PAZEO 0.7 % SOLN Place 1 drop into both eyes at bedtime.   Potassium Chloride ER 20 MEQ TBCR Take 20 mEq by mouth daily.   potassium chloride SA (K-DUR) 20 MEQ tablet TAKE 1 TABLET IN THE MORNING. (Patient taking differently: Take 20 mEq by mouth daily. )   predniSONE (STERAPRED UNI-PAK 21 TAB) 10 MG (21) TBPK tablet Take by mouth daily. Take 6 tabs by mouth daily  for 2 days, then 5 tabs for 2 days, then 4 tabs for 2 days, then 3 tabs for 2 days, 2 tabs for 2 days, then 1 tab by mouth daily for 2 days   PROAIR HFA 108 (90 Base) MCG/ACT inhaler INHALE 2 PUFFS BY MOUTH EVERY 4 HOURS AS NEEDED FOR WHEEZING (Patient taking differently: Inhale 2 puffs into the lungs every 4 (four) hours as needed for wheezing or shortness of breath. )   Respiratory Therapy Supplies (FLUTTER) DEVI 1 Device by Does not apply route 3 (three) times daily.   sitaGLIPtin (JANUVIA) 100 MG tablet Take 100 mg by mouth daily.   spironolactone (ALDACTONE) 25 MG tablet Take 0.5 tablets (12.5 mg total) by mouth daily.   TRELEGY ELLIPTA 100-62.5-25 MCG/INH AEPB INHALE 1 PUFF INTO THE LUNGS ONCE DAILY AS DIRECTED.   zolpidem (AMBIEN) 10 MG tablet Take 10 mg by mouth at bedtime.   No facility-administered encounter medications on file as of 09/15/2019.      Review of  Systems  Review of Systems   Physical Exam  LMP 06/04/2000 (LMP Unknown) Comment: tubal ligation  Wt Readings from Last 5 Encounters:  08/31/19 215 lb (97.5 kg)  06/18/19 218 lb (98.9 kg)  03/30/19 218 lb 3.2 oz (99 kg)  01/12/19 227 lb (103 kg)  12/18/18 225 lb 12.8 oz (102.4 kg)    BMI Readings from Last 5 Encounters:  08/31/19 41.99 kg/m  06/18/19 42.58 kg/m  03/30/19 42.61 kg/m  01/12/19 44.33 kg/m  12/18/18 44.10 kg/m     Physical Exam    Assessment & Plan:   No problem-specific Assessment & Plan notes found for this encounter.    No follow-ups on file.   Lauraine Rinne, NP 09/14/2019   This appointment was *** minutes long with over 50% of the time in direct face-to-face patient care, assessment, plan of care, and follow-up.

## 2019-09-15 ENCOUNTER — Ambulatory Visit: Payer: Medicaid Other | Admitting: Pulmonary Disease

## 2019-10-13 ENCOUNTER — Ambulatory Visit: Payer: Medicaid Other | Admitting: Emergency Medicine

## 2019-10-13 ENCOUNTER — Other Ambulatory Visit: Payer: Self-pay

## 2019-10-13 NOTE — Progress Notes (Unsigned)
Virtual Visit via Telephone Note  Attempted to connect with Stephanie Schultz on 10/13/19 at 10:15 AM EST by telephone.  Location: Patient: home  Provider: office     History of Present Illness: 63 year old woman with asthmatic COPD, superimposed upper airway irritation syndrome and chronic cough.  She also has obesity with nocturnal hypoxemia without OSA.  She has frequent flares of dyspnea and cough, has been treated several times since I saw her in March, was in the ER most recently 08/31/2019   Observations/Objective:  I called Ms Soderman but was unable to reach her.   Assessment and Plan:   Follow Up Instructions: Will work on rescheduling an [place] with her     Collene Gobble, MD

## 2019-10-19 ENCOUNTER — Ambulatory Visit: Payer: Medicaid Other | Admitting: Dietician

## 2019-10-27 ENCOUNTER — Ambulatory Visit: Payer: Medicaid Other | Admitting: Cardiology

## 2019-10-27 NOTE — Progress Notes (Signed)
Virtual Visit via telephone Note   This visit type was conducted due to national recommendations for restrictions regarding the COVID-19 Pandemic (e.g. social distancing) in an effort to limit this patient's exposure and mitigate transmission in our community.  Due to her co-morbid illnesses, this patient is at least at moderate risk for complications without adequate follow up.  This format is felt to be most appropriate for this patient at this time.  All issues noted in this document were discussed and addressed.  A limited physical exam was performed with this format.  Please refer to the patient's chart for her consent to telehealth for [facility].   Evaluation Performed:  Follow-up visit  This visit type was conducted due to national recommendations for restrictions regarding the COVID-19 Pandemic (e.g. social distancing).  This format is felt to be most appropriate for this patient at this time.  All issues noted in this document were discussed and addressed.  No physical exam was performed (except for noted visual exam findings with Video Visits).  Please refer to the patient's chart (MyChart message for video visits and phone note for telephone visits) for the patient's consent to telehealth for [facility].  Date:  10/28/2019   ID:  Stephanie Schultz, Stephanie Schultz [birth year], MRN [id number]  Patient Location:  Home  Provider location:   [place]  PCP:  [name], MD  Cardiologist:  Fransico Him, MD  Electrophysiologist:  None   Chief Complaint:  CHF  History of Present Illness:    Stephanie Schultz is a 63 y.o. female who presents via audio/video conferencing for a telehealth visit today.    Stephanie Schultz is a62y/o AAFwith a history of DM2, HTN, hyperlipidemia, COPD with chronic respiratory failure on chronic O2, OSA on CPAP, prior tobacco abuse, chronic diastolic CHF and morbid obesity. She had a cath in 2009 showing normal coronary arteries and normal LVF.  She had recurrent CP and underwent repeat LHC 12/03/16 with normal coronaries, EF 55-65%, normal LVEDP.Based on symptoms, increased O2 requirements and increased sputum production, she was felt to have an acute COPD exacerbation and was treated with improvement in symptoms.  She is here today for followup and is doing well.  She denies any chest pain or pressure, PND, orthopnea,  palpitations or syncope. She has chronic DOE related to COPD that is stable.  She has chronic LE edema in her ankle related to arthritis. She tells me that her aide noticed a knot under her ear that is not painful. She has had some problems vertigo.  She is compliant with her meds and is tolerating meds with no SE.    The patient does not have symptoms concerning for COVID-19 infection (fever, chills, cough, or new shortness of breath).    Prior CV studies:   The following studies were reviewed today:  None  Past Medical History:  Diagnosis Date  . Anemia    chronis  . Anxiety   . Arthritis   . Cervical cancer ([place]) 1990   cervical   . Chronic diastolic CHF (congestive heart failure) ([place]) 08/10/2016  . COPD (chronic obstructive pulmonary disease) ([place])    O2 dependent. Pulmo: [name]  . Diabetes mellitus without complication ([place])    a. A1c 8.3 in 11/2015  . Emphysema   . Gallstones    s/p cholecystectomy  . GERD (gastroesophageal reflux disease)   . Gout   . History of home oxygen therapy    "2.5L; 24/7" (08/10/2016)  .  Hx of cardiac catheterization    a. LHC at [facility] in [place], [place] 09/2008:  Normal coronary arteries EF 70%. b. LHC 11/2016: normal cors, normal LVEDP, EF 55-65%.  . Hyperlipidemia   . Hypertension   . Hypoxia 11/17/2018  . Morbid obesity ([place])   . OSA on CPAP    CPAP at night   . Pneumonia   . Sickle cell trait ([place])   . Tobacco abuse    a. up to 3ppd from age 18 to 41, now 1/4 ppd (01/2013) >> Quit 10/2015   Past Surgical History:  Procedure Laterality Date  . BREAST BIOPSY      . CARDIAC CATHETERIZATION    . CARDIAC CATHETERIZATION N/A 12/03/2016   Procedure: Left Heart Cath and Coronary Angiography;  Surgeon: Peter M Martinique, MD;  Location: [facility];  Service: Cardiovascular;  Laterality: N/A;  . CHOLECYSTECTOMY N/A 12/13/2015   Procedure: LAPAROSCOPIC CHOLECYSTECTOMY;  Surgeon: Ralene Ok, MD;  Location: [facility];  Service: General;  Laterality: N/A;  . COLONOSCOPY  09/05/2012   Procedure: COLONOSCOPY;  Surgeon: Beryle Beams, MD;  Location: [facility];  Service: Endoscopy;  Laterality: N/A;  . COLONOSCOPY WITH PROPOFOL N/A 09/02/2015   Procedure: COLONOSCOPY WITH PROPOFOL;  Surgeon: Carol Ada, MD;  Location: [facility];  Service: Endoscopy;  Laterality: N/A;  . ESOPHAGOGASTRODUODENOSCOPY (EGD) WITH PROPOFOL N/A 03/29/2017   Procedure: ESOPHAGOGASTRODUODENOSCOPY (EGD) WITH PROPOFOL;  Surgeon: Carol Ada, MD;  Location: [facility];  Service: Endoscopy;  Laterality: N/A;  . TUBAL LIGATION       Current Meds  Medication Sig  . albuterol (VENTOLIN HFA) 108 (90 Base) MCG/ACT inhaler Inhale 2 puffs into the lungs every 4 (four) hours as needed for wheezing or shortness of breath.  . allopurinol (ZYLOPRIM) 100 MG tablet Take 100 mg by mouth daily.  Marland Kitchen ALPRAZolam (XANAX) 1 MG tablet Take 1 mg by mouth at bedtime as needed for sleep.   Marland Kitchen aspirin EC 81 MG tablet Take 81 mg by mouth daily.  . cetirizine (ZYRTEC) 10 MG tablet Take 10 mg by mouth daily.  . Cholecalciferol (VITAMIN D PO) Take 1 tablet by mouth daily.  . clobetasol cream (TEMOVATE) [id number] % Apply 1 application topically daily as needed (irritation from pull ups).   . esomeprazole (NEXIUM) 40 MG capsule Take 40 mg by mouth daily at 12 noon.  . fluticasone (FLONASE) 50 MCG/ACT nasal spray Place 1 spray into both nostrils daily as needed (congestion).  . furosemide (LASIX) 40 MG tablet Take 40 mg by mouth in the AM and 20 mg by mouth in the PM  . glipiZIDE (GLUCOTROL) 10 MG tablet Take 10 mg  by mouth 2 (two) times daily before a meal.   . HYDROcodone-acetaminophen (NORCO) 7.5-325 MG tablet Take 1 tablet by mouth every 6 (six) hours as needed for moderate pain.  Marland Kitchen insulin glargine (LANTUS) 100 unit/mL SOPN Inject 18 Units into the skin at bedtime.   . insulin lispro (HUMALOG) 100 UNIT/ML injection Inject 6-10 Units into the skin once. 6units for sugar over 200 10 units for sugar over 300  . ipratropium-albuterol (DUONEB) 0.5-2.5 (3) MG/3ML SOLN Take 3 mLs by nebulization every 6 (six) hours as needed (wheezing, shortness of breath).  . Linaclotide (LINZESS) 145 MCG CAPS capsule Take 145 mcg by mouth daily.   . meclizine (ANTIVERT) 25 MG tablet Take 25 mg by mouth every 6 (six) hours as needed.  . Melatonin 2.5 MG CHEW Chew 2.5 mg by mouth See  admin instructions. Only takes when waking up in middle of the night  . metoprolol succinate (TOPROL-XL) 50 MG 24 hr tablet Take 50 mg by mouth daily. Take with or immediately following a meal.  . mupirocin ointment (BACTROBAN) 2 % mupirocin 2 % topical ointment  apply to NOSE twice a day  . ondansetron (ZOFRAN ODT) 4 MG disintegrating tablet Take 1 tablet (4 mg total) by mouth every 8 (eight) hours as needed for nausea or vomiting.  . OXYGEN Inhale 2.5 L into the lungs continuous.   Marland Kitchen PAZEO 0.7 % SOLN Place 1 drop into both eyes at bedtime.  . Potassium Chloride ER 20 MEQ TBCR Take 20 mEq by mouth daily.  Marland Kitchen Respiratory Therapy Supplies (FLUTTER) DEVI 1 Device by Does not apply route 3 (three) times daily.  . sitaGLIPtin (JANUVIA) 100 MG tablet Take 100 mg by mouth daily.  Marland Kitchen spironolactone (ALDACTONE) 25 MG tablet Take 0.5 tablets (12.5 mg total) by mouth daily.  . TRELEGY ELLIPTA 100-62.5-25 MCG/INH AEPB INHALE 1 PUFF INTO THE LUNGS ONCE DAILY AS DIRECTED.  Marland Kitchen zolpidem (AMBIEN) 10 MG tablet Take 10 mg by mouth at bedtime.     Allergies:   Acetaminophen   Social History   Tobacco Use  . Smoking status: Former Smoker    Packs/day: 3.00     Years: 36.00    Pack years: 108.00    Types: Cigarettes    Quit date: 11/11/2015    Years since quitting: 3.9  . Smokeless tobacco: Never Used  . Tobacco comment: Approx 90 pk-yrs (up to 3ppd until ~ 2009). Smoking 3 cigs per day now.  Substance Use Topics  . Alcohol use: No    Alcohol/week: 0.0 standard drinks  . Drug use: No     Family Hx: The patient's family history includes Diabetes in her mother; Lung cancer in her paternal aunt and paternal grandfather; Myasthenia gravis in her mother; Other in her father and another family member. There is no history of Heart attack, Heart failure, or Breast cancer.  ROS:   Please see the history of present illness.     All other systems reviewed and are negative.   Labs/Other Tests and Data Reviewed:    Recent Labs: 11/17/2018: ALT 38 08/31/2019: B Natriuretic Peptide 11.3; BUN 10; Creatinine, Ser 1.02; Hemoglobin 11.5; Platelets 231; Potassium 3.7; Sodium 141   Recent Lipid Panel Lab Results  Component Value Date/Time   CHOL 119 12/01/2016 02:05 AM   TRIG 212 (H) 12/01/2016 02:05 AM   HDL 34 (L) 12/01/2016 02:05 AM   CHOLHDL 3.5 12/01/2016 02:05 AM   LDLCALC 43 12/01/2016 02:05 AM    Wt Readings from Last 3 Encounters:  10/28/19 212 lb (96.2 kg)  08/31/19 215 lb (97.5 kg)  06/18/19 218 lb (98.9 kg)     Objective:    Vital Signs:  Ht 5' (1.524 m)   Wt 212 lb (96.2 kg)   LMP 06/04/2000 (LMP Unknown) Comment: tubal ligation  BMI 41.40 kg/m     ASSESSMENT & PLAN:    1.   Chronic diastolic CHF -weight is stable and she has actually lost 6lbs. -She has chronic shortness of breath related to her COPD but this has not changed any since I saw her last and she has not  had any change in her O2 requirements recently.   -she has mild ankle edema that is stable and due to arthritis -continue <2gm Na diet -I again stressed the importance of daily weights  and calling if weight goes over 3 pounds in a day or 5 pounds in a week.     -continue Lasix 40mg   qam and 20mg  qpm -check BMET  2.  Hypertension -BP controlled at 106/17mmHg -continue Toprol XL 50mg  daily and spiro 12.5mg  daily   3.  Obesity  -she has lost 6lbs since I saw her last -she is exercising and trying to lose weight  4.  Chronic shortness of breath -related to COPD and breathing is stable -this is followed by pulmonary medicine.   -She is on chronic O2 at home.   5.  Diabetes mellitus type 2 -this is followed by her PCP.   -Her last hemoglobin A1c in January was 7.9.   -She will continue on glipizide 10 mg BID as well as Lantus insulin 18 units at bedtime as well as Januvia 100 mg daily.  6.  Neck mass -? Enlarged lymph node -encouraged her to see her PCP for evaluation  COVID-19 Education: The signs and symptoms of COVID-19 were discussed with the patient and how to seek care for testing (follow up with PCP or arrange E-visit).  The importance of social distancing was discussed today.  Patient Risk:   After full review of this patient's clinical status, I feel that they are at least moderate risk at this time.  Time:   Today, I have spent 20 minutes on telemedicine discussing medical problems including CHF, HTN, SOB, obesity.  We also reviewed the symptoms of COVID 19 and the ways to protect against contracting the virus with telehealth technology.  I spent an additional 5 minutes reviewing patient's chart including labs.  Medication Adjustments/Labs and Tests Ordered: Current medicines are reviewed at length with the patient today.  Concerns regarding medicines are outlined above.  Tests Ordered: No orders of the defined types were placed in this encounter.  Medication Changes: No orders of the defined types were placed in this encounter.   Disposition:  Follow up in 6 month(s)  Signed, Fransico Him, MD  10/28/2019 9:31 AM    [facility]

## 2019-10-28 ENCOUNTER — Encounter: Payer: Self-pay | Admitting: Cardiology

## 2019-10-28 ENCOUNTER — Telehealth (INDEPENDENT_AMBULATORY_CARE_PROVIDER_SITE_OTHER): Payer: Medicaid Other | Admitting: Cardiology

## 2019-10-28 ENCOUNTER — Other Ambulatory Visit: Payer: Self-pay

## 2019-10-28 VITALS — Ht 60.0 in | Wt 212.0 lb

## 2019-10-28 DIAGNOSIS — N181 Chronic kidney disease, stage 1: Secondary | ICD-10-CM

## 2019-10-28 DIAGNOSIS — E1122 Type 2 diabetes mellitus with diabetic chronic kidney disease: Secondary | ICD-10-CM

## 2019-10-28 DIAGNOSIS — R0602 Shortness of breath: Secondary | ICD-10-CM

## 2019-10-28 DIAGNOSIS — I10 Essential (primary) hypertension: Secondary | ICD-10-CM

## 2019-10-28 DIAGNOSIS — I5032 Chronic diastolic (congestive) heart failure: Secondary | ICD-10-CM | POA: Diagnosis not present

## 2019-10-28 DIAGNOSIS — Z794 Long term (current) use of insulin: Secondary | ICD-10-CM

## 2019-10-28 NOTE — Addendum Note (Signed)
Addended by: Antonieta Iba on: 10/28/2019 09:47 AM   Modules accepted: Orders

## 2019-10-28 NOTE — Patient Instructions (Signed)
Medication Instructions:  Your physician recommends that you continue on your current medications as directed. Please refer to the Current Medication list given to you today.  *If you need a refill on your cardiac medications before your next appointment, please call your pharmacy*  Lab Work: BMET  If you have labs (blood work) drawn today and your tests are completely normal, you will receive your results only by: Marland Kitchen MyChart Message (if you have MyChart) OR . A paper copy in the mail If you have any lab test that is abnormal or we need to change your treatment, we will call you to review the results.  Follow-Up: At [facility], you and your health needs are our priority.  As part of our continuing mission to provide you with exceptional heart care, we have created designated Provider Care Teams.  These Care Teams include your primary Cardiologist (physician) and Advanced Practice Providers (APPs -  Physician Assistants and Nurse Practitioners) who all work together to provide you with the care you need, when you need it.  Your next appointment:   6 month(s)  The format for your next appointment:   Either In Person or Virtual  Provider:   Fransico Him, MD

## 2019-10-29 ENCOUNTER — Encounter: Payer: Medicaid Other | Attending: Internal Medicine | Admitting: Dietician

## 2019-10-29 ENCOUNTER — Other Ambulatory Visit: Payer: Self-pay

## 2019-10-29 DIAGNOSIS — N181 Chronic kidney disease, stage 1: Secondary | ICD-10-CM | POA: Insufficient documentation

## 2019-10-29 DIAGNOSIS — E1122 Type 2 diabetes mellitus with diabetic chronic kidney disease: Secondary | ICD-10-CM | POA: Insufficient documentation

## 2019-10-29 DIAGNOSIS — Z794 Long term (current) use of insulin: Secondary | ICD-10-CM | POA: Insufficient documentation

## 2019-10-29 NOTE — Patient Instructions (Addendum)
Cook before getting hungry so that you have something nourishing instead of grabbing something easy when you wait to prepare a meal.  Choose water or diet gingerale.  Avoid beverages with carbohydrates. Be sure to drink enough fluid Aim to have small meals and avoid skipping meals.  Simple is fine such as backed chicken, baked summer squash and onions, baked sweet potato. Try making your own salad rather than [employer] which is very high sodium. Consider LUVO frozen meals.  These are low sodium Have a small amount of protein with each meal (meat, egg, beans, milk) 1/2 your plate should be non starchy vegetables 1/4 of your plate should be carbohydrate  You can have 1 small serving of fruit with each meal Continue to avoid added salt. Choose low sodium soup and no added salt vegetables

## 2019-11-01 ENCOUNTER — Encounter: Payer: Self-pay | Admitting: Dietician

## 2019-11-01 NOTE — Progress Notes (Addendum)
Diabetes Self-Management Education  Visit Type: Follow-up  Appt. Start Time: 1015 Appt. End Time: 1050  11/01/2019  Stephanie Schultz, identified by name and date of birth, is a 63 y.o. female with a diagnosis of Diabetes:  .   ASSESSMENT Patient was last seem in our office today for follow up.  She was last seen by myself 06/18/2019.  She reports Prednisone again 08/2019. She states that she often skips breakfast due to poor appetite and nausea and at times is unable to eat until 3:00 pm due to these symptoms.  Discussed that this can be a side effect of the Januvia although patient reports that she has been on this medication for some time without side effects.  Discussed for her to speak to her doctor about her symptoms and reviewed the importance of balanced nutrition. She states that she is continuing to wean off regular soda.  History includes type 2 diabetes for about 15 years, HTN, COPD on home oxygen.  She states that she was on C-pap and felt better on this but no longer meets testing criteria to maintain.  She states that she has trouble falling asleep and sleeping through the night.  She also has gout. Weight 10/2019 217 lbs and was 212 lbs at home this am, 06/2019 218 lbs and feels best at 215 lbs. A1C 7.9% 11/18/2018 increased to 8.4% 05/29/2019 and vitamin D 41 05/29/2019. GFR 72 01/2019 She states that she was on Prednisone 3 months ago. Medications include glipizide, Lantus 25 units q HS, Humalog if glucose is >200, Januvia  Patient lives alone.  She is on disability.  She is currently in school to get her GED.  She moved from [facility]. about 10 years ago.  Her daughter helps with bills and other things as able but does not live locally.  She has an aide who helps her shop.  She does her own cooking. 06/2019:  She reports to problems having adequate food at this time although does buy salads at [employer] salad's often.  Discussed that these salads are high in sodium (1150 mg per  1/2 salad without dressing).  She states that she takes so much medication, she is seldom hungry in the am and waits to eat until about 3-5 pm except for snacks.  She will occasional have a low BG and she treats this with peppermint candy or regular soda.  She states that she struggles with sugary drinks, gatorade, and homemade cake.  She does not use any added salt. She relies on [employer] and her aide for transportation.  She spoke of trying to get her drivers license again.  Weight 217 lb (98.4 kg), last menstrual period 06/04/2000. Body mass index is 42.38 kg/m.  Diabetes Self-Management Education - 11/01/19 0901      Visit Information   Visit Type  Follow-up      Psychosocial Assessment   Patient Belief/Attitude about Diabetes  Motivated to manage diabetes    Self-care barriers  Debilitated state due to current medical condition    Self-management support  Doctor's office;Family    Other persons present  Patient    Patient Concerns  Nutrition/Meal planning;Glycemic Control;Weight Control    Special Needs  None    Preferred Learning Style  No preference indicated    How often do you need to have someone help you when you read instructions, pamphlets, or other written materials from your doctor or pharmacy?  2 - Rarely      Pre-Education Assessment  Patient understands the diabetes disease and treatment process.  Demonstrates understanding / competency    Patient understands incorporating nutritional management into lifestyle.  Needs Review    Patient undertands incorporating physical activity into lifestyle.  Demonstrates understanding / competency    Patient understands using medications safely.  Demonstrates understanding / competency    Patient understands monitoring blood glucose, interpreting and using results  Demonstrates understanding / competency    Patient understands prevention, detection, and treatment of acute complications.  Demonstrates understanding / competency     Patient understands prevention, detection, and treatment of chronic complications.  Demonstrates understanding / competency    Patient understands how to develop strategies to address psychosocial issues.  Needs Review    Patient understands how to develop strategies to promote health/change behavior.  Needs Review      Complications   How often do you check your blood sugar?  1-2 times/day    Fasting Blood glucose range (mg/dL)  70-129      Dietary Intake   Breakfast  skips OR sausage, boiled egg, dry toast OR banana    Dinner  hamburger helper, salad    Beverage(s)  water, water with vinegar, lemon, and honey (once per day started after symptoms of nausea and states this helps her feel better), weaning off regular soda      Exercise   Exercise Type  ADL's      Patient Education   Previous Diabetes Education  Yes (please comment)   06/2019   Disease state   Other (comment)    Nutrition management   Meal options for control of blood glucose level and chronic complications.;Meal timing in regards to the patients' current diabetes medication.    Physical activity and exercise   Helped patient identify appropriate exercises in relation to his/her diabetes, diabetes complications and other health issue.    Medications  Reviewed patients medication for diabetes, action, purpose, timing of dose and side effects.      Individualized Goals (developed by patient)   Nutrition  General guidelines for healthy choices and portions discussed    Medications  take my medication as prescribed    Monitoring   test my blood glucose as discussed      Patient Self-Evaluation of Goals - Patient rates self as meeting previously set goals (% of time)   Nutrition  50 - 75 %    Physical Activity  Not Applicable    Medications  >75%    Monitoring  >75%    Problem Solving  >75%    Health Coping  >75%      Post-Education Assessment   Patient understands the diabetes disease and treatment process.   Demonstrates understanding / competency    Patient understands incorporating nutritional management into lifestyle.  Needs Review    Patient undertands incorporating physical activity into lifestyle.  Demonstrates understanding / competency    Patient understands using medications safely.  Demonstrates understanding / competency    Patient understands monitoring blood glucose, interpreting and using results  Demonstrates understanding / competency    Patient understands prevention, detection, and treatment of acute complications.  Demonstrates understanding / competency    Patient understands prevention, detection, and treatment of chronic complications.  Demonstrates understanding / competency    Patient understands how to develop strategies to address psychosocial issues.  Demonstrates understanding / competency    Patient understands how to develop strategies to promote health/change behavior.  Needs Review      Outcomes   Expected  Outcomes  Demonstrated interest in learning. Expect positive outcomes    Future DMSE  2 months    Program Status  Completed      Subsequent Visit   Since your last visit have you continued or begun to take your medications as prescribed?  Yes    Since your last visit have you experienced any weight changes?  Loss    Weight Loss (lbs)  1    Since your last visit, are you checking your blood glucose at least once a day?  Yes       Individualized Plan for Diabetes Self-Management Training:   Learning Objective:  Patient will have a greater understanding of diabetes self-management. Patient education plan is to attend individual and/or group sessions per assessed needs and concerns.   Plan:   Patient Instructions  Lacinda Axon before getting hungry so that you have something nourishing instead of grabbing something easy when you wait to prepare a meal.  Choose water or diet gingerale.  Avoid beverages with carbohydrates. Be sure to drink enough fluid Aim to have  small meals and avoid skipping meals.  Simple is fine such as backed chicken, baked summer squash and onions, baked sweet potato. Try making your own salad rather than [employer] which is very high sodium. Consider LUVO frozen meals.  These are low sodium Have a small amount of protein with each meal (meat, egg, beans, milk) 1/2 your plate should be non starchy vegetables 1/4 of your plate should be carbohydrate  You can have 1 small serving of fruit with each meal Continue to avoid added salt. Choose low sodium soup and no added salt vegetables   Expected Outcomes:  Demonstrated interest in learning. Expect positive outcomes  Education material provided:   If problems or questions, patient to contact team via:  Phone and Email  Future DSME appointment: 2 months

## 2019-11-03 ENCOUNTER — Other Ambulatory Visit: Payer: Medicaid Other

## 2019-11-09 ENCOUNTER — Other Ambulatory Visit: Payer: Self-pay

## 2019-11-09 ENCOUNTER — Ambulatory Visit: Payer: Medicaid Other | Admitting: Emergency Medicine

## 2019-11-09 ENCOUNTER — Encounter: Payer: Self-pay | Admitting: Emergency Medicine

## 2019-11-09 DIAGNOSIS — J9611 Chronic respiratory failure with hypoxia: Secondary | ICD-10-CM

## 2019-11-09 DIAGNOSIS — I5032 Chronic diastolic (congestive) heart failure: Secondary | ICD-10-CM | POA: Diagnosis not present

## 2019-11-09 DIAGNOSIS — J309 Allergic rhinitis, unspecified: Secondary | ICD-10-CM

## 2019-11-09 DIAGNOSIS — J449 Chronic obstructive pulmonary disease, unspecified: Secondary | ICD-10-CM | POA: Diagnosis not present

## 2019-11-09 NOTE — Progress Notes (Signed)
Subjective:    Patient ID: Stephanie Schultz, female    DOB: 30-May-1956, 63 y.o.   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  Also with exertional and nocturnal hypoxemia in the absence of OSA.  She is on 2 to 3 L/min by nasal cannula.  Hospitalized beginning of January for an acute flare, ? Superimposed dCHF decompensation.  She is been seen twice in our office since including on 2/6 when she was noticing exertional desaturations. She just finished PT, feels that it helped her a lot. Her lasix has also been increased - has helped her breathing; then she decreased it because she is worried about her renal fxn.  She had PFT today that I reviewed, shows very severe obstruction, FEV1 33% predicted (down from 50% in 2015). She is Trelegy, tolerates well, gets good benefit. She uses albuterol about  She was treated with azithro for a possible sinusitis. She is on loratadine, fluticasone NS, nexium.   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  She has hypertension with diastolic CHF, has benefited from increased diuresis in the past.  She had to go to the emergency department 10/19 for acute worsening of dyspnea even after being treated for a possible acute exacerbation of COPD 08/13/2019 where she received another prednisone taper.  She reports today that she is working on exercise and wt loss. Her breathing has benefited from this. She is on 2.5-3l/min. She is on trelegy, uses albuterol a few times a month. Has Duoneb available but rarely uses it (a few times in the last several months). ? Whether she needs re-qualify for her O2. She is using zyrtec, flonase prn.    ROS  See history of present illness   Objective:   Physical Exam Vitals:   11/09/19 1030  BP: 124/64  Pulse: 74   Temp: (!) 97.1 F (36.2 C)  TempSrc: Temporal  SpO2: 94%  Weight: 215 lb 6.4 oz (97.7 kg)  Height: 5' (1.524 m)    Gen: Obese, in no distress,  normal affect  ENT: No lesions,  mouth clear,  oropharynx clear, no postnasal drip, strong voice, no UA noise Neck: No JVD, no stridor  Lungs: No use of accessory muscles, distant, prolonged exp, no wheezing  Cardiovascular: RRR, heart sounds normal, no murmur or gallops, no peripheral edema  Musculoskeletal: No deformities, no cyanosis or clubbing  Neuro: alert, non focal  Skin: Warm, no lesions or rashes, old scars on B UE's   Assessment & Plan:   COPD (chronic obstructive pulmonary disease) (HCC) Please continue Trelegy once daily.  Rinse and gargle after using. Keep your albuterol available use 2 puffs if needed for shortness of breath, chest tightness, wheezing. Keep your DuoNeb available to use if you have increased shortness of breath. Follow with Dr Lamonte Sakai in 6 months or sooner if you have any problems  Chronic diastolic CHF (congestive heart failure) ([place]) Continue to follow with Dr. Radford Pax, take your diuretic medications as directed.   Chronic allergic rhinitis More active lately - she is only using flonase prn.   Continue Zyrtec once daily. Please start taking your fluticasone nasal spray, 2 sprays each nostril once daily every day for the next several weeks since you are having increased congestion.  At that time if you are doing well you can go back to using  it only as needed.   Chronic respiratory failure with hypoxia [facility]) She has been told by her DME that she need to requalify  Continue your oxygen at 2.5-3 L/min at all times.  We will repeat your oxygen qualification today.   [name], MD, PhD 11/09/2019, 11:08 AM [facility] [phone] or if no answer [phone]

## 2019-11-09 NOTE — Patient Instructions (Signed)
Please continue Trelegy once daily.  Rinse and gargle after using. Keep your albuterol available use 2 puffs if needed for shortness of breath, chest tightness, wheezing. Keep your DuoNeb available to use if you have increased shortness of breath. Continue your oxygen at 2.5-3 L/min at all times.  We will repeat your oxygen qualification today. Continue Zyrtec once daily. Please start taking your fluticasone nasal spray, 2 sprays each nostril once daily every day for the next several weeks since you are having increased congestion.  At that time if you are doing well you can go back to using it only as needed. Continue your exercise routine.  Congratulations on your weight loss. Continue to follow with Dr. Radford Pax, take your diuretic medications as directed. Follow with Dr Lamonte Sakai in 6 months or sooner if you have any problems

## 2019-11-09 NOTE — Assessment & Plan Note (Signed)
More active lately - she is only using flonase prn.   Continue Zyrtec once daily. Please start taking your fluticasone nasal spray, 2 sprays each nostril once daily every day for the next several weeks since you are having increased congestion.  At that time if you are doing well you can go back to using it only as needed.

## 2019-11-09 NOTE — Assessment & Plan Note (Signed)
Continue to follow with Dr. Radford Pax, take your diuretic medications as directed.

## 2019-11-09 NOTE — Assessment & Plan Note (Signed)
Please continue Trelegy once daily.  Rinse and gargle after using. Keep your albuterol available use 2 puffs if needed for shortness of breath, chest tightness, wheezing. Keep your DuoNeb available to use if you have increased shortness of breath. Follow with Dr Lamonte Sakai in 6 months or sooner if you have any problems

## 2019-11-09 NOTE — Assessment & Plan Note (Signed)
She has been told by her DME that she need to requalify  Continue your oxygen at 2.5-3 L/min at all times.  We will repeat your oxygen qualification today.

## 2019-12-01 ENCOUNTER — Ambulatory Visit (INDEPENDENT_AMBULATORY_CARE_PROVIDER_SITE_OTHER): Payer: Medicaid Other | Admitting: Pulmonary Disease

## 2019-12-01 ENCOUNTER — Encounter: Payer: Self-pay | Admitting: Pulmonary Disease

## 2019-12-01 ENCOUNTER — Telehealth: Payer: Self-pay | Admitting: Pulmonary Disease

## 2019-12-01 ENCOUNTER — Other Ambulatory Visit: Payer: Self-pay

## 2019-12-01 DIAGNOSIS — J9611 Chronic respiratory failure with hypoxia: Secondary | ICD-10-CM | POA: Diagnosis not present

## 2019-12-01 DIAGNOSIS — J449 Chronic obstructive pulmonary disease, unspecified: Secondary | ICD-10-CM | POA: Diagnosis not present

## 2019-12-01 DIAGNOSIS — J441 Chronic obstructive pulmonary disease with (acute) exacerbation: Secondary | ICD-10-CM | POA: Diagnosis not present

## 2019-12-01 MED ORDER — DOXYCYCLINE HYCLATE 100 MG PO TABS: 100.0000 mg | ORAL_TABLET | Freq: Two times a day (BID) | ORAL | 0 refills | Status: DC

## 2019-12-01 MED ORDER — PREDNISONE 10 MG PO TABS: ORAL_TABLET | ORAL | 0 refills | Status: DC

## 2019-12-01 NOTE — Telephone Encounter (Signed)
Patient had a televisit today with Aaron Edelman. Patient will need to be scheduled for a 2wk f/u (around 12/15/19). Called patient but she did not answer. Left message for her to call back to get scheduled. She can be scheduled with either [name] or Aaron Edelman.

## 2019-12-01 NOTE — Progress Notes (Signed)
Virtual Visit via Telephone Note  I connected with Stephanie Schultz on 12/01/19 at  3:30 PM EST by telephone and verified that I am speaking with the correct person using two identifiers.  Location: Patient: Home Provider: Office [facility] - [id number], [place], [place], [place] [address]   I discussed the limitations, risks, security and privacy concerns of performing an evaluation and management service by telephone and the availability of in person appointments. I also discussed with the patient that there may be a patient responsible charge related to this service. The patient expressed understanding and agreed to proceed.  Patient consented to consult via telephone: Yes People present and their role in pt care: Pt     History of Present Illness:  64 year old female former smoker followed in our office for COPD with an asthmatic component, upper airway irritation syndrome, chronic cough, and chronic respiratory failure  PMH: Obesity, hypertension, diastolic dysfunction Smoker/ Smoking History: Former smoker.  Quit 2016.  108 pack year smoker.  Maintenance: Trelegy Ellipta Pt of: [name]  Chief complaint: Increased shortness of breath, congestion    64 year old female former smoker followed in our office for COPD.  Patient reporting that for the last 3 to 5 days she had increased shortness of breath, cough, congestion.  She denies fevers, body aches, chills, loss of sense of smell or taste.  Cough is productive with thick yellow to white mucus.  She is to continue to use her Trelegy Ellipta as well as her rescue inhaler 1-2 times a day.  She feels that she is having increased nasal congestion.  She denies any recent sick contacts.  She is practicing home isolation and physical distancing, wearing a mask as well as appropriate hand hygiene.  Observations/Objective:  11/17/2018-chest x-ray- no evidence of acute disease  08/23/2017-echocardiogram-LV ejection  fraction 60 to 65%  11/16/2013-pulmonary function test- FVC 1.58 (67% predicted), postbronchodilator ratio 60, postbronchodilator FEV1 1.00 (53% predicted), no significant bronchodilator response, mid flow reversibility after administration of bronchodilator, DLCO 39  Social History   Tobacco Use  Smoking Status Former Smoker  . Packs/day: 3.00  . Years: 36.00  . Pack years: 108.00  . Types: Cigarettes  . Quit date: 11/11/2015  . Years since quitting: 4.0  Smokeless Tobacco Never Used  Tobacco Comment   Approx 90 pk-yrs (up to 3ppd until ~ 2009). Smoking 3 cigs per day now.   Immunization History  Administered Date(s) Administered  . Influenza Split 09/20/2011, 09/12/2012, 08/12/2013  . Influenza,inj,Quad PF,6+ Mos 08/03/2014, 08/12/2015, 08/12/2017, 07/29/2018  . Pneumococcal Conjugate-13 09/24/2017  . Pneumococcal Polysaccharide-23 11/12/2009      Assessment and Plan:  Chronic respiratory failure with hypoxia (HCC) Plan: Continue oxygen therapy as prescribed  COPD (chronic obstructive pulmonary disease) (HCC) Plan: Continue Trelegy Ellipta We will treat today for COPD exacerbation   COPD with acute exacerbation ([place]) Plan: Doxycycline today Prednisone taper 1 to 2-week follow-up with our office Notify our office if symptoms are not improving   Follow Up Instructions:  Return in about 2 weeks (around 12/15/2019), or if symptoms worsen or fail to improve, for Follow up with [name], Follow up with Wyn Quaker FNP-C.   I discussed the assessment and treatment plan with the patient. The patient was provided an opportunity to ask questions and all were answered. The patient agreed with the plan and demonstrated an understanding of the instructions.   The patient was advised to call back or seek an in-person evaluation if  the symptoms worsen or if the condition fails to improve as anticipated.  I provided 15 minutes of non-face-to-face time during this  encounter.   Lauraine Rinne, NP

## 2019-12-01 NOTE — Telephone Encounter (Signed)
Patient called back. She was scheduled for 12/15/19 at 1030am with [place]. Will close this encounter.

## 2019-12-01 NOTE — Assessment & Plan Note (Addendum)
Plan: Doxycycline today Prednisone taper 1 to 2-week follow-up with our office Notify our office if symptoms are not improving

## 2019-12-01 NOTE — Assessment & Plan Note (Signed)
Plan: Continue oxygen therapy as prescribed 

## 2019-12-01 NOTE — Patient Instructions (Addendum)
You were seen today by Lauraine Rinne, NP  for:   1. Chronic obstructive pulmonary disease, unspecified COPD type ([place])  Trelegy Ellipta  >>> 1 puff daily in the morning >>>rinse mouth out after use  >>> This inhaler contains 3 medications that help manage her respiratory status, contact our office if you cannot afford this medication or unable to remain on this medication  Only use your albuterol as a rescue medication to be used if you can't catch your breath by resting or doing a relaxed purse lip breathing pattern.  - The less you use it, the better it will work when you need it. - Ok to use up to 2 puffs  every 4 hours if you must but call for immediate appointment if use goes up over your usual need - Don't leave home without it !!  (think of it like the spare tire for your car)   Note your daily symptoms > remember "red flags" for COPD:   >>>Increase in cough >>>increase in sputum production >>>increase in shortness of breath or activity  intolerance.   If you notice these symptoms, please call the office to be seen.    2. Chronic respiratory failure with hypoxia (HCC)  Continue oxygen therapy as prescribed  >>>maintain oxygen saturations greater than 88 percent  >>>if unable to maintain oxygen saturations please contact the office  >>>do not smoke with oxygen  >>>can use nasal saline gel or nasal saline rinses to moisturize nose if oxygen causes dryness  3. COPD with acute exacerbation (HCC)  - doxycycline (VIBRA-TABS) 100 MG tablet; Take 1 tablet (100 mg total) by mouth 2 (two) times daily.  Dispense: 14 tablet; Refill: 0 - predniSONE (DELTASONE) 10 MG tablet; 4 tabs for 2 days, then 3 tabs for 2 days, 2 tabs for 2 days, then 1 tab for 2 days, then stop  Dispense: 20 tablet; Refill: 0  Explained to patient that if symptoms worsen she needs to seek emergent evaluation and also contact our office  We recommend today:   Meds ordered this encounter  Medications  .  doxycycline (VIBRA-TABS) 100 MG tablet    Sig: Take 1 tablet (100 mg total) by mouth 2 (two) times daily.    Dispense:  14 tablet    Refill:  0  . predniSONE (DELTASONE) 10 MG tablet    Sig: 4 tabs for 2 days, then 3 tabs for 2 days, 2 tabs for 2 days, then 1 tab for 2 days, then stop    Dispense:  20 tablet    Refill:  0    Follow Up:    Return in about 2 weeks (around 12/15/2019), or if symptoms worsen or fail to improve, for Follow up with [name], Follow up with Wyn Quaker FNP-C.   Please do your part to reduce the spread of COVID-19:      Reduce your risk of any infection  and COVID19 by using the similar precautions used for avoiding the common cold or flu:  Marland Kitchen Wash your hands often with soap and warm water for at least 20 seconds.  If soap and water are not readily available, use an alcohol-based hand sanitizer with at least 60% alcohol.  . If coughing or sneezing, cover your mouth and nose by coughing or sneezing into the elbow areas of your shirt or coat, into a tissue or into your sleeve (not your hands). Langley Gauss A MASK when in public  . Avoid shaking hands with others  and consider head nods or verbal greetings only. . Avoid touching your eyes, nose, or mouth with unwashed hands.  . Avoid close contact with people who are sick. . Avoid places or events with large numbers of people in one location, like concerts or sporting events. . If you have some symptoms but not all symptoms, continue to monitor at home and seek medical attention if your symptoms worsen. . If you are having a medical emergency, call 911.   [place] / e-Visit: [email or web address]         [facility]: [facility]: [id number]                   [facility]: [id number]     It is flu season:   >>> Best ways to protect herself from the flu:  Receive the yearly flu vaccine, practice good hand hygiene washing with soap and also using hand sanitizer when available, eat a nutritious meals, get adequate rest, hydrate appropriately   Please contact the office if your symptoms worsen or you have concerns that you are not improving.   Thank you for choosing [facility] for your healthcare, and for allowing [place] to partner with you on your healthcare journey. I am thankful to be able to provide care to you today.   Wyn Quaker FNP-C

## 2019-12-01 NOTE — Assessment & Plan Note (Signed)
Plan: Continue Trelegy Ellipta We will treat today for COPD exacerbation

## 2019-12-07 ENCOUNTER — Other Ambulatory Visit: Payer: Self-pay | Admitting: Cardiology

## 2019-12-09 MED ORDER — FUROSEMIDE 40 MG PO TABS: ORAL_TABLET | ORAL | 11 refills | Status: DC

## 2019-12-09 MED ORDER — POTASSIUM CHLORIDE ER 20 MEQ PO TBCR: 20.0000 meq | EXTENDED_RELEASE_TABLET | Freq: Every day | ORAL | 3 refills | Status: DC

## 2019-12-09 NOTE — Telephone Encounter (Signed)
Ok to refill 

## 2019-12-09 NOTE — Telephone Encounter (Signed)
Pt's pharmacy is requesting a refill on potassium 20 meq. This medication has not been refilled sent 2018. Would Dr. Radford Pax like to refill this medication? Please address

## 2019-12-09 NOTE — Telephone Encounter (Signed)
*  STAT* If patient is at the pharmacy, call can be transferred to refill team.   1. Which medications need to be refilled? (please list name of each medication and dose if known)  Potassium and Furosemide  2. Which pharmacy/location (including street and city if local pharmacy) is medication to be sent to? [place], [place]  3. Do they need a 30 day or 90 day supply? 90 days and refills- pt had her last appt in December

## 2019-12-11 ENCOUNTER — Other Ambulatory Visit: Payer: Self-pay

## 2019-12-11 ENCOUNTER — Other Ambulatory Visit: Payer: Self-pay | Admitting: Cardiology

## 2019-12-11 ENCOUNTER — Other Ambulatory Visit: Payer: Medicaid Other | Admitting: *Deleted

## 2019-12-12 LAB — BASIC METABOLIC PANEL
BUN/Creatinine Ratio: 16 (ref 12–28)
BUN: 13 mg/dL (ref 8–27)
CO2: 30 mmol/L — ABNORMAL HIGH (ref 20–29)
Calcium: 9.6 mg/dL (ref 8.7–10.3)
Chloride: 99 mmol/L (ref 96–106)
Creatinine, Ser: 0.82 mg/dL (ref 0.57–1.00)
GFR calc Af Amer: 89 mL/min/{1.73_m2} (ref >59)
GFR calc non Af Amer: 77 mL/min/{1.73_m2} (ref >59)
Glucose: 157 mg/dL — ABNORMAL HIGH (ref 65–99)
Potassium: 4 mmol/L (ref 3.5–5.2)
Sodium: 144 mmol/L (ref 134–144)

## 2019-12-15 ENCOUNTER — Encounter: Payer: Self-pay | Admitting: Adult Health

## 2019-12-15 ENCOUNTER — Other Ambulatory Visit: Payer: Self-pay

## 2019-12-15 ENCOUNTER — Ambulatory Visit: Payer: Medicaid Other | Admitting: Adult Health

## 2019-12-15 ENCOUNTER — Ambulatory Visit: Payer: Medicaid Other | Admitting: Emergency Medicine

## 2019-12-15 DIAGNOSIS — R040 Epistaxis: Secondary | ICD-10-CM

## 2019-12-15 DIAGNOSIS — J9611 Chronic respiratory failure with hypoxia: Secondary | ICD-10-CM | POA: Diagnosis not present

## 2019-12-15 DIAGNOSIS — J449 Chronic obstructive pulmonary disease, unspecified: Secondary | ICD-10-CM

## 2019-12-15 DIAGNOSIS — I5032 Chronic diastolic (congestive) heart failure: Secondary | ICD-10-CM

## 2019-12-15 NOTE — Progress Notes (Signed)
@Patient  ID: Stephanie Schultz, female    DOB: 1956-04-15, 64 y.o.   MRN: [id number]  Chief Complaint  Patient presents with  . Follow-up    COPD     Referring provider: [name], MD  HPI: 64 year old female former smoker followed for COPD with an asthmatic component, chronic cough and chronic respiratory failure Medical history significant for diastolic dysfunction  TEST/EVENTS :  08/23/2017-echocardiogram-LV ejection fraction 60 to 65%  11/16/2013-pulmonary function test-FVC 1.58 (67% predicted), postbronchodilator ratio 60, postbronchodilator FEV1 1.00 (53% predicted), no significant bronchodilator response, mid flow reversibility after administration of bronchodilator, DLCO 39  CT chest October 2020 - Neg for PE, +emphysema with no acute process.  12/15/2019 Follow up : COPD , O2 RF  Patient presents for a 2-week follow-up.  Patient was recently seen for a COPD exacerbation.  Treated with doxycycline and prednisone.  Patient says she is feeling better with decreased cough and congestion.  Feels that she is getting close to her baseline.  Patient does get short of breath with heavy activity.  Does try to do light exercises at home.  She remains on oxygen at 3 L at rest and 4 L with activity.  She continues on Trelegy daily.  She says she has been having a lot of nasal dryness stuffy nose and some intermittent nosebleeds.  She has not been able to use her Flonase due to the nosebleeds.  She denies any chest pain orthopnea PND or increased leg swelling.  Length says that her ankle edema has been improved recently she is trying to work on weight loss   Allergies  Allergen Reactions  . Acetaminophen Other (See Comments)    Pt was told that she could not take Tylenol, does not know why.    Immunization History  Administered Date(s) Administered  . Influenza Split 09/20/2011, 09/12/2012, 08/12/2013  . Influenza,inj,Quad PF,6+ Mos 08/03/2014, 08/12/2015, 08/12/2017,  07/29/2018  . Influenza,inj,Quad PF,6-35 Mos 09/11/2019  . Pneumococcal Conjugate-13 09/24/2017  . Pneumococcal Polysaccharide-23 11/12/2009    Past Medical History:  Diagnosis Date  . Anemia    chronis  . Anxiety   . Arthritis   . Cervical cancer ([place]) 1990   cervical   . Chronic diastolic CHF (congestive heart failure) ([place]) 08/10/2016  . COPD (chronic obstructive pulmonary disease) ([place])    O2 dependent. Pulmo: [name]  . Diabetes mellitus without complication ([place])    a. A1c 8.3 in 11/2015  . Emphysema   . Gallstones    s/p cholecystectomy  . GERD (gastroesophageal reflux disease)   . Gout   . History of home oxygen therapy    "2.5L; 24/7" (08/10/2016)  . Hx of cardiac catheterization    a. LHC at [facility] in [place] 09/2008:  Normal coronary arteries EF 70%. b. LHC 11/2016: normal cors, normal LVEDP, EF 55-65%.  . Hyperlipidemia   . Hypertension   . Hypoxia 11/17/2018  . Morbid obesity ([place])   . OSA on CPAP    CPAP at night   . Pneumonia   . Sickle cell trait ([place])   . Tobacco abuse    a. up to 3ppd from age 80 to 32, now 1/4 ppd (01/2013) >> Quit 10/2015    Tobacco History: Social History   Tobacco Use  Smoking Status Former Smoker  . Packs/day: 3.00  . Years: 36.00  . Pack years: 108.00  . Types: Cigarettes  . Quit date: 11/11/2015  . Years since quitting: 4.0  Smokeless Tobacco Never  Used  Tobacco Comment   Approx 90 pk-yrs (up to 3ppd until ~ 2009). Smoking 3 cigs per day now.   Counseling given: Not Answered Comment: Approx 90 pk-yrs (up to 3ppd until ~ 2009). Smoking 3 cigs per day now.   Outpatient Medications Prior to Visit  Medication Sig Dispense Refill  . albuterol (VENTOLIN HFA) 108 (90 Base) MCG/ACT inhaler Inhale 2 puffs into the lungs every 4 (four) hours as needed for wheezing or shortness of breath.    . allopurinol (ZYLOPRIM) 100 MG tablet Take 100 mg by mouth daily.    Marland Kitchen ALPRAZolam (XANAX) 1 MG tablet Take 1 mg by mouth at bedtime  as needed for sleep.   0  . aspirin EC 81 MG tablet Take 81 mg by mouth daily.    . cetirizine (ZYRTEC) 10 MG tablet Take 10 mg by mouth daily.    . Cholecalciferol (VITAMIN D PO) Take 1 tablet by mouth daily.    . clobetasol cream (TEMOVATE) [id number] % Apply 1 application topically daily as needed (irritation from pull ups).   0  . esomeprazole (NEXIUM) 40 MG capsule Take 40 mg by mouth daily at 12 noon.    . fluticasone (FLONASE) 50 MCG/ACT nasal spray Place 1 spray into both nostrils daily as needed (congestion).    . furosemide (LASIX) 40 MG tablet Take 40 mg by mouth in the AM and 20 mg by mouth in the PM 45 tablet 11  . glipiZIDE (GLUCOTROL) 10 MG tablet Take 10 mg by mouth 2 (two) times daily before a meal.     . HYDROcodone-acetaminophen (NORCO) 7.5-325 MG tablet Take 1 tablet by mouth every 6 (six) hours as needed for moderate pain.    Marland Kitchen insulin glargine (LANTUS) 100 unit/mL SOPN Inject 18 Units into the skin at bedtime.     . insulin lispro (HUMALOG) 100 UNIT/ML injection Inject 6-10 Units into the skin once. 6units for sugar over 200 10 units for sugar over 300    . ipratropium-albuterol (DUONEB) 0.5-2.5 (3) MG/3ML SOLN Take 3 mLs by nebulization every 6 (six) hours as needed (wheezing, shortness of breath). 360 mL 0  . Linaclotide (LINZESS) 145 MCG CAPS capsule Take 145 mcg by mouth daily.     . meclizine (ANTIVERT) 25 MG tablet Take 25 mg by mouth every 6 (six) hours as needed.    . Melatonin 2.5 MG CHEW Chew 2.5 mg by mouth See admin instructions. Only takes when waking up in middle of the night    . metoprolol succinate (TOPROL-XL) 50 MG 24 hr tablet Take 50 mg by mouth daily. Take with or immediately following a meal.    . mupirocin ointment (BACTROBAN) 2 % mupirocin 2 % topical ointment  apply to NOSE twice a day    . ondansetron (ZOFRAN ODT) 4 MG disintegrating tablet Take 1 tablet (4 mg total) by mouth every 8 (eight) hours as needed for nausea or vomiting. 20 tablet 0  . OXYGEN  Inhale 2.5 L into the lungs continuous.     Marland Kitchen PAZEO 0.7 % SOLN Place 1 drop into both eyes at bedtime.    . Potassium Chloride ER 20 MEQ TBCR Take 20 mEq by mouth daily. 90 tablet 3  . Respiratory Therapy Supplies (FLUTTER) DEVI 1 Device by Does not apply route 3 (three) times daily. 1 each 0  . sitaGLIPtin (JANUVIA) 100 MG tablet Take 100 mg by mouth daily.    Marland Kitchen spironolactone (ALDACTONE) 25 MG tablet Take 0.5  tablets (12.5 mg total) by mouth daily. 15 tablet 12  . TRELEGY ELLIPTA 100-62.5-25 MCG/INH AEPB INHALE 1 PUFF INTO THE LUNGS ONCE DAILY AS DIRECTED. 60 each 5  . zolpidem (AMBIEN) 10 MG tablet Take 10 mg by mouth at bedtime.    Marland Kitchen doxycycline (VIBRA-TABS) 100 MG tablet Take 1 tablet (100 mg total) by mouth 2 (two) times daily. 14 tablet 0  . predniSONE (DELTASONE) 10 MG tablet 4 tabs for 2 days, then 3 tabs for 2 days, 2 tabs for 2 days, then 1 tab for 2 days, then stop 20 tablet 0   No facility-administered medications prior to visit.     Review of Systems:   Constitutional:   No  weight loss, night sweats,  Fevers, chills,  +fatigue, or  lassitude.  HEENT:   No headaches,  Difficulty swallowing,  Tooth/dental problems, or  Sore throat,                No sneezing, itching, ear ache, nasal congestion, post nasal drip,   CV:  No chest pain,  Orthopnea, PND,  , anasarca, dizziness, palpitations, syncope.   GI  No heartburn, indigestion, abdominal pain, nausea, vomiting, diarrhea, change in bowel habits, loss of appetite, bloody stools.   Resp:   No chest wall deformity  Skin: no rash or lesions.  GU: no dysuria, change in color of urine, no urgency or frequency.  No flank pain, no hematuria   MS:  No joint pain or swelling.  No decreased range of motion.  No back pain.    Physical Exam  BP (!) 110/58 (BP Location: Left Arm, Cuff Size: Normal)   Pulse 69   Temp (!) 97 F (36.1 C) (Temporal)   Ht 5' (1.524 m)   Wt 213 lb 9.6 oz (96.9 kg)   LMP 06/04/2000 (LMP Unknown)  Comment: tubal ligation  SpO2 97% Comment: 4 liters  BMI 41.72 kg/m   GEN: A/Ox3; pleasant , NAD, BMI 41, on oxygen   HEENT:  [place]/AT,   NOSE-clear, THROAT-clear, no lesions, no postnasal drip or exudate noted.   NECK:  Supple w/ fair ROM; no JVD; normal carotid impulses w/o bruits; no thyromegaly or nodules palpated; no lymphadenopathy.    RESP  Clear  P & A; w/o, wheezes/ rales/ or rhonchi. no accessory muscle use, no dullness to percussion  CARD:  RRR, no m/r/g, tr peripheral edema, pulses intact, no cyanosis or clubbing.  GI:   Soft & nt; nml bowel sounds; no organomegaly or masses detected.   Musco: Warm bil, no deformities or joint swelling noted.   Neuro: alert, no focal deficits noted.    Skin: Warm, no lesions or rashes    Lab Results:  BMET   BNP ProBNP  Imaging: No results found.    PFT Results Latest Ref Rng & Units 01/12/2019 11/16/2013  FVC-Pre L - 1.58  FVC-Predicted Pre % 51 67  FVC-Post L 1.18 1.66  FVC-Predicted Post % 52 70  Pre FEV1/FVC % % 52 59  Post FEV1/FCV % % 56 60  FEV1-Pre L 0.59 0.94  FEV1-Predicted Pre % 33 50  FEV1-Post L 0.66 1.00  DLCO UNC% % 33 39  DLCO COR %Predicted % 60 61  TLC L 6.71 3.60  TLC % Predicted % 150 80  RV % Predicted % 254 109    No results found for: NITRICOXIDE      Assessment & Plan:   COPD (chronic obstructive pulmonary disease) (HCC) Recent COPD  exacerbation now resolved after antibiotics and steroids.  Patient appears to be getting close to baseline.  Have encouraged her on advancing activity as tolerated.  She does have a component of deconditioning  Plan  Patient Instructions  Continue on TRELEGY 1 puff daily , rinse after use.  Wear Oxygen 3lm rest and 4/m with activity .  Work on healthy weight loss.  Saline nasal rinses Twice daily  And saline nasal gel.  Follow up [name]  In 3 month and As needed   Please contact office for sooner follow up if symptoms do not improve or worsen or seek  emergency care       Chronic diastolic CHF (congestive heart failure) ([place]) Appears to be under control.  No change in current regimen  Chronic respiratory failure with hypoxia (HCC) Compensated on oxygen continue on current level  Epistaxis, recurrent Hold off on Flonase for now.  Use saline nasal spray and saline nasal gel     Rexene Edison, NP 12/15/2019

## 2019-12-15 NOTE — Assessment & Plan Note (Signed)
Recent COPD exacerbation now resolved after antibiotics and steroids.  Patient appears to be getting close to baseline.  Have encouraged her on advancing activity as tolerated.  She does have a component of deconditioning  Plan  Patient Instructions  Continue on TRELEGY 1 puff daily , rinse after use.  Wear Oxygen 3lm rest and 4/m with activity .  Work on healthy weight loss.  Saline nasal rinses Twice daily  And saline nasal gel.  Follow up [name]  In 3 month and As needed   Please contact office for sooner follow up if symptoms do not improve or worsen or seek emergency care

## 2019-12-15 NOTE — Assessment & Plan Note (Signed)
Compensated on oxygen continue on current level

## 2019-12-15 NOTE — Assessment & Plan Note (Signed)
Appears to be under control.  No change in current regimen

## 2019-12-15 NOTE — Assessment & Plan Note (Signed)
Hold off on Flonase for now.  Use saline nasal spray and saline nasal gel

## 2019-12-15 NOTE — Patient Instructions (Addendum)
Continue on TRELEGY 1 puff daily , rinse after use.  Wear Oxygen 3lm rest and 4/m with activity .  Work on healthy weight loss.  Saline nasal rinses Twice daily  And saline nasal gel.  Follow up [name]  In 3 month and As needed   Please contact office for sooner follow up if symptoms do not improve or worsen or seek emergency care

## 2019-12-24 ENCOUNTER — Encounter: Payer: Medicaid Other | Attending: Internal Medicine | Admitting: Dietician

## 2019-12-24 DIAGNOSIS — E1122 Type 2 diabetes mellitus with diabetic chronic kidney disease: Secondary | ICD-10-CM | POA: Insufficient documentation

## 2019-12-24 DIAGNOSIS — N181 Chronic kidney disease, stage 1: Secondary | ICD-10-CM | POA: Insufficient documentation

## 2019-12-24 DIAGNOSIS — Z794 Long term (current) use of insulin: Secondary | ICD-10-CM | POA: Insufficient documentation

## 2020-01-27 ENCOUNTER — Telehealth: Payer: Self-pay | Admitting: Pulmonary Disease

## 2020-01-27 NOTE — Telephone Encounter (Signed)
Called and spoke to pt. 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 Advised pt we have not heard of this information. Advised pt that I would contact our [employer] liaison, Melissa, and see if this is true and if there is another [place] site opening. LMTCB for Melissa. Will call pt back after speaking with Melissa.

## 2020-01-29 ENCOUNTER — Other Ambulatory Visit: Payer: Self-pay | Admitting: Cardiology

## 2020-02-02 NOTE — Telephone Encounter (Signed)
Spoke with the pt  I gave her the below information regarding [employer]  She verbalized understanding  Advised that it is okay to take her meds the day she goes to take her covid vaccine

## 2020-02-02 NOTE — Telephone Encounter (Signed)
Spoke with Lenna Sciara and was advised that the [employer] retail store is closing but it is because [employer] has bought [employer] and they are merging the [place] office out of the former [place] office. [employer] will still be in [place], store is on [address].   LMTCB for pt.

## 2020-02-02 NOTE — Telephone Encounter (Signed)
Pt called back regarding this please return call.  Pt added she is scheduled a vaccine appt and wants to make sure she can take all her meds before that appt.

## 2020-02-11 ENCOUNTER — Other Ambulatory Visit: Payer: Self-pay | Admitting: Internal Medicine

## 2020-02-11 DIAGNOSIS — Z1231 Encounter for screening mammogram for malignant neoplasm of breast: Secondary | ICD-10-CM

## 2020-02-26 ENCOUNTER — Other Ambulatory Visit: Payer: Self-pay | Admitting: Internal Medicine

## 2020-02-26 ENCOUNTER — Other Ambulatory Visit: Payer: Self-pay | Admitting: Emergency Medicine

## 2020-02-26 MED ORDER — TRELEGY ELLIPTA 100-62.5-25 MCG/INH IN AEPB: 1.0000 | INHALATION_SPRAY | Freq: Every day | RESPIRATORY_TRACT | 5 refills | Status: DC

## 2020-03-14 ENCOUNTER — Ambulatory Visit: Payer: Medicaid Other | Admitting: Adult Health

## 2020-03-15 ENCOUNTER — Other Ambulatory Visit: Payer: Self-pay

## 2020-03-15 ENCOUNTER — Encounter: Payer: Self-pay | Admitting: Adult Health

## 2020-03-15 ENCOUNTER — Ambulatory Visit (INDEPENDENT_AMBULATORY_CARE_PROVIDER_SITE_OTHER): Payer: Medicaid Other | Admitting: Adult Health

## 2020-03-15 DIAGNOSIS — J9611 Chronic respiratory failure with hypoxia: Secondary | ICD-10-CM | POA: Diagnosis not present

## 2020-03-15 DIAGNOSIS — J441 Chronic obstructive pulmonary disease with (acute) exacerbation: Secondary | ICD-10-CM | POA: Diagnosis not present

## 2020-03-15 MED ORDER — AZITHROMYCIN 250 MG PO TABS: ORAL_TABLET | ORAL | 0 refills | Status: AC

## 2020-03-15 NOTE — Progress Notes (Signed)
@Patient  ID: Stephanie Schultz, female    DOB: [birth year], 64 y.o.   MRN: [id number]  Chief Complaint  Patient presents with  . Follow-up    COPD     Referring provider: [name], MD  HPI: 64 year old female former smoker followed for COPD with an asthmatic component, chronic cough and chronic respiratory failure on oxygen Medical history significant for diastolic dysfunction  TEST/EVENTS :  08/23/2017-echocardiogram-LV ejection fraction 60 to 65%  11/16/2013-pulmonary function test-FVC 1.58 (67% predicted), postbronchodilator ratio 60, postbronchodilator FEV1 1.00 (53% predicted), no significant bronchodilator response, mid flow reversibility after administration of bronchodilator, DLCO 39  CT chest October 2020 - Neg for PE, +emphysema with no acute process.  03/15/2020 Follow up : COPD, O2 RF  Patient presents for a 4-month follow-up.  Patient has underlying COPD with an asthmatic component.  She says she has been doing well up until the last 2 weeks.  She is had increased cough and congestion with thick mucus.  Over the last few days she has been seeing darker mucus.  She denies any hemoptysis chest pain orthopnea PND or fever.  She has received her Covid vaccine.  She denies any loss of taste or smell.  Patient says she is trying to be more active.  She has been eating better and has lost about 10 pounds.  She also recently got her driver's license which she has not driven in greater than 30 years.  She is getting a car so she can be more active and mobile.  She is also going back to school to get her GED this fall.  We have discussed pulmonary rehab with her in the past.  She would like to do this but has been able to do this because of lack of insurance coverage for pulmonary rehab and also transportation.  She says this fall she will have Medicare and so will consider this.     Allergies  Allergen Reactions  . Acetaminophen Other (See Comments)    Pt was told  that she could not take Tylenol, does not know why.    Immunization History  Administered Date(s) Administered  . Influenza Split 09/20/2011, 09/12/2012, 08/12/2013  . Influenza,inj,Quad PF,6+ Mos 08/03/2014, 08/12/2015, 08/12/2017, 07/29/2018  . Influenza,inj,Quad PF,6-35 Mos 09/11/2019  . PFIZER SARS-COV-2 Vaccination 02/05/2020, 02/25/2020  . Pneumococcal Conjugate-13 09/24/2017  . Pneumococcal Polysaccharide-23 11/12/2009    Past Medical History:  Diagnosis Date  . Anemia    chronis  . Anxiety   . Arthritis   . Cervical cancer ([place]) 1990   cervical   . Chronic diastolic CHF (congestive heart failure) ([place]) 08/10/2016  . COPD (chronic obstructive pulmonary disease) ([place])    O2 dependent. Pulmo: [name]  . Diabetes mellitus without complication ([place])    a. A1c 8.3 in 11/2015  . Emphysema   . Gallstones    s/p cholecystectomy  . GERD (gastroesophageal reflux disease)   . Gout   . History of home oxygen therapy    "2.5L; 24/7" (08/10/2016)  . Hx of cardiac catheterization    a. LHC at [facility] in [place] 09/2008:  Normal coronary arteries EF 70%. b. LHC 11/2016: normal cors, normal LVEDP, EF 55-65%.  . Hyperlipidemia   . Hypertension   . Hypoxia 11/17/2018  . Morbid obesity ([place])   . OSA on CPAP    CPAP at night   . Pneumonia   . Sickle cell trait ([place])   . Tobacco abuse    a. up  to 3ppd from age 87 to 70, now 1/4 ppd (01/2013) >> Quit 10/2015    Tobacco History: Social History   Tobacco Use  Smoking Status Former Smoker  . Packs/day: 3.00  . Years: 36.00  . Pack years: 108.00  . Types: Cigarettes  . Quit date: 11/11/2015  . Years since quitting: 4.3  Smokeless Tobacco Never Used  Tobacco Comment   Approx 90 pk-yrs (up to 3ppd until ~ 2009). Smoking 3 cigs per day now.   Counseling given: Not Answered Comment: Approx 90 pk-yrs (up to 3ppd until ~ 2009). Smoking 3 cigs per day now.   Outpatient Medications Prior to Visit  Medication Sig Dispense Refill    . albuterol (PROVENTIL) (2.5 MG/3ML) 0.083% nebulizer solution USE 1 VIAL VIA NEBULIZER 4 TIMES DAILY AS NEEDED. 300 mL 5  . albuterol (VENTOLIN HFA) 108 (90 Base) MCG/ACT inhaler Inhale 2 puffs into the lungs every 4 (four) hours as needed for wheezing or shortness of breath.    . allopurinol (ZYLOPRIM) 100 MG tablet Take 100 mg by mouth daily.    Marland Kitchen ALPRAZolam (XANAX) 1 MG tablet Take 1 mg by mouth at bedtime as needed for sleep.   0  . aspirin EC 81 MG tablet Take 81 mg by mouth daily.    . cetirizine (ZYRTEC) 10 MG tablet Take 10 mg by mouth daily.    . Cholecalciferol (VITAMIN D PO) Take 1 tablet by mouth daily.    . clobetasol cream (TEMOVATE) [id number] % Apply 1 application topically daily as needed (irritation from pull ups).   0  . esomeprazole (NEXIUM) 40 MG capsule Take 40 mg by mouth daily at 12 noon.    . fluticasone (FLONASE) 50 MCG/ACT nasal spray Place 1 spray into both nostrils daily as needed (congestion).    . Fluticasone-Umeclidin-Vilant (TRELEGY ELLIPTA) 100-62.5-25 MCG/INH AEPB Take 1 puff by mouth daily. 60 each 5  . furosemide (LASIX) 40 MG tablet TAKE 1 TABLET IN THE AM AND 1/2 TABLET IN PM. 135 tablet 2  . glipiZIDE (GLUCOTROL) 10 MG tablet Take 10 mg by mouth 2 (two) times daily before a meal.     . HYDROcodone-acetaminophen (NORCO) 7.5-325 MG tablet Take 1 tablet by mouth every 6 (six) hours as needed for moderate pain.    Marland Kitchen insulin glargine (LANTUS) 100 unit/mL SOPN Inject 18 Units into the skin at bedtime.     . insulin lispro (HUMALOG) 100 UNIT/ML injection Inject 6-10 Units into the skin once. 6units for sugar over 200 10 units for sugar over 300    . Linaclotide (LINZESS) 145 MCG CAPS capsule Take 145 mcg by mouth daily.     . meclizine (ANTIVERT) 25 MG tablet Take 25 mg by mouth every 6 (six) hours as needed.    . Melatonin 2.5 MG CHEW Chew 2.5 mg by mouth See admin instructions. Only takes when waking up in middle of the night    . metoprolol succinate (TOPROL-XL)  50 MG 24 hr tablet Take 50 mg by mouth daily. Take with or immediately following a meal.    . mupirocin ointment (BACTROBAN) 2 % mupirocin 2 % topical ointment  apply to NOSE twice a day    . ondansetron (ZOFRAN ODT) 4 MG disintegrating tablet Take 1 tablet (4 mg total) by mouth every 8 (eight) hours as needed for nausea or vomiting. 20 tablet 0  . OXYGEN Inhale 2.5 L into the lungs continuous.     Marland Kitchen PAZEO 0.7 % SOLN Place  1 drop into both eyes at bedtime.    . Potassium Chloride ER 20 MEQ TBCR Take 20 mEq by mouth daily. 90 tablet 3  . Respiratory Therapy Supplies (FLUTTER) DEVI 1 Device by Does not apply route 3 (three) times daily. 1 each 0  . sitaGLIPtin (JANUVIA) 100 MG tablet Take 100 mg by mouth daily.    Marland Kitchen spironolactone (ALDACTONE) 25 MG tablet Take 0.5 tablets (12.5 mg total) by mouth daily. 15 tablet 12  . zolpidem (AMBIEN) 10 MG tablet Take 10 mg by mouth at bedtime.    Marland Kitchen ipratropium-albuterol (DUONEB) 0.5-2.5 (3) MG/3ML SOLN Take 3 mLs by nebulization every 6 (six) hours as needed (wheezing, shortness of breath). 360 mL 0  . potassium chloride SA (KLOR-CON) 20 MEQ tablet TAKE 1 TABLET IN THE MORNING. 90 tablet 2   No facility-administered medications prior to visit.     Review of Systems:   Constitutional:   No  weight loss, night sweats,  Fevers, chills,  +fatigue, or  lassitude.  HEENT:   No headaches,  Difficulty swallowing,  Tooth/dental problems, or  Sore throat,                No sneezing, itching, ear ache, nasal congestion, post nasal drip,   CV:  No chest pain,  Orthopnea, PND, swelling in lower extremities, anasarca, dizziness, palpitations, syncope.   GI  No heartburn, indigestion, abdominal pain, nausea, vomiting, diarrhea, change in bowel habits, loss of appetite, bloody stools.   Resp:    No chest wall deformity  Skin: no rash or lesions.  GU: no dysuria, change in color of urine, no urgency or frequency.  No flank pain, no hematuria   MS:  No joint pain  or swelling.  No decreased range of motion.  No back pain.    Physical Exam  BP (!) 110/58 (BP Location: Left Arm, Patient Position: Sitting, Cuff Size: Normal)   Pulse 69   Temp 98.2 F (36.8 C) (Temporal)   Ht 5' (1.524 m)   Wt 212 lb 12.8 oz (96.5 kg)   LMP 06/04/2000 (LMP Unknown) Comment: tubal ligation  SpO2 99% Comment: o2 3l/min  BMI 41.56 kg/m   GEN: A/Ox3; pleasant , NAD    HEENT:  [place]/AT,  THROAT-clear, no lesions, no postnasal drip or exudate noted.   NECK:  Supple w/ fair ROM; no JVD; normal carotid impulses w/o bruits; no thyromegaly or nodules palpated; no lymphadenopathy.    RESP  Clear  P & A; w/o, wheezes/ rales/ or rhonchi. no accessory muscle use, no dullness to percussion  CARD:  RRR, no m/r/g, no peripheral edema, pulses intact, no cyanosis or clubbing.  GI:   Soft & nt; nml bowel sounds; no organomegaly or masses detected.   Musco: Warm bil, no deformities or joint swelling noted.   Neuro: alert, no focal deficits noted.    Skin: Warm, no lesions or rashes    Lab Results:  CBC  BMET   BNP  Imaging: No results found.    PFT Results Latest Ref Rng & Units 01/12/2019 11/16/2013  FVC-Pre L - 1.58  FVC-Predicted Pre % 51 67  FVC-Post L 1.18 1.66  FVC-Predicted Post % 52 70  Pre FEV1/FVC % % 52 59  Post FEV1/FCV % % 56 60  FEV1-Pre L 0.59 0.94  FEV1-Predicted Pre % 33 50  FEV1-Post L 0.66 1.00  DLCO UNC% % 33 39  DLCO COR %Predicted % 60 61  TLC L 6.71 3.60  TLC % Predicted % 150 80  RV % Predicted % 254 109    No results found for: NITRICOXIDE      Assessment & Plan:   COPD (chronic obstructive pulmonary disease) (HCC) Mild flare with bronchitis  Plan  Patient Instructions  Zpack take as directed. -take with food.  Mucinex DM Twice daily  As needed  Cough/congestion  Continue on TRELEGY 1 puff daily , rinse after use.  Wear Oxygen 3lm rest and 4/m with activity .  Work on healthy weight loss.  Saline nasal rinses Twice  daily  And saline nasal gel.  Advance activity as tolerated.  Follow up [name]  In 3 month and As needed   Please contact office for sooner follow up if symptoms do not improve or worsen or seek emergency care       Chronic respiratory failure with hypoxia ([place]) Compensated On present regimen of oxygen     Rexene Edison, NP 03/15/2020

## 2020-03-15 NOTE — Assessment & Plan Note (Addendum)
Compensated On present regimen of oxygen

## 2020-03-15 NOTE — Assessment & Plan Note (Signed)
Mild flare with bronchitis  Plan  Patient Instructions  Zpack take as directed. -take with food.  Mucinex DM Twice daily  As needed  Cough/congestion  Continue on TRELEGY 1 puff daily , rinse after use.  Wear Oxygen 3lm rest and 4/m with activity .  Work on healthy weight loss.  Saline nasal rinses Twice daily  And saline nasal gel.  Advance activity as tolerated.  Follow up [name]  In 3 month and As needed   Please contact office for sooner follow up if symptoms do not improve or worsen or seek emergency care

## 2020-03-15 NOTE — Patient Instructions (Addendum)
Zpack take as directed. -take with food.  Mucinex DM Twice daily  As needed  Cough/congestion  Continue on TRELEGY 1 puff daily , rinse after use.  Wear Oxygen 3lm rest and 4/m with activity .  Work on healthy weight loss.  Saline nasal rinses Twice daily  And saline nasal gel.  Advance activity as tolerated.  Follow up [name]  In 3 month and As needed   Please contact office for sooner follow up if symptoms do not improve or worsen or seek emergency care

## 2020-03-21 ENCOUNTER — Other Ambulatory Visit: Payer: Self-pay

## 2020-03-21 ENCOUNTER — Ambulatory Visit (INDEPENDENT_AMBULATORY_CARE_PROVIDER_SITE_OTHER): Payer: Medicaid Other

## 2020-03-21 ENCOUNTER — Ambulatory Visit: Payer: Medicaid Other | Admitting: Podiatry

## 2020-03-21 DIAGNOSIS — G5762 Lesion of plantar nerve, left lower limb: Secondary | ICD-10-CM | POA: Diagnosis not present

## 2020-03-21 DIAGNOSIS — Q6689 Other  specified congenital deformities of feet: Secondary | ICD-10-CM | POA: Diagnosis not present

## 2020-03-21 DIAGNOSIS — Q828 Other specified congenital malformations of skin: Secondary | ICD-10-CM

## 2020-03-21 DIAGNOSIS — L989 Disorder of the skin and subcutaneous tissue, unspecified: Secondary | ICD-10-CM

## 2020-03-24 NOTE — Progress Notes (Signed)
   HPI: 64 y.o. female presenting today for follow up evaluation of left foot pain secondary to callus lesions that have been present for the past year. Walking increases the pain. He has not had any recent treatment for the symptoms. Patient is here for further evaluation and treatment.   Past Medical History:  Diagnosis Date  . Anemia    chronis  . Anxiety   . Arthritis   . Cervical cancer ([place]) 1990   cervical   . Chronic diastolic CHF (congestive heart failure) ([place]) 08/10/2016  . COPD (chronic obstructive pulmonary disease) ([place])    O2 dependent. Pulmo: [name]  . Diabetes mellitus without complication ([place])    a. A1c 8.3 in 11/2015  . Emphysema   . Gallstones    s/p cholecystectomy  . GERD (gastroesophageal reflux disease)   . Gout   . History of home oxygen therapy    "2.5L; 24/7" (08/10/2016)  . Hx of cardiac catheterization    a. LHC at [facility] in [place] 09/2008:  Normal coronary arteries EF 70%. b. LHC 11/2016: normal cors, normal LVEDP, EF 55-65%.  . Hyperlipidemia   . Hypertension   . Hypoxia 11/17/2018  . Morbid obesity ([place])   . OSA on CPAP    CPAP at night   . Pneumonia   . Sickle cell trait ([place])   . Tobacco abuse    a. up to 3ppd from age 25 to 19, now 1/4 ppd (01/2013) >> Quit 10/2015      Physical Exam: General: The patient is alert and oriented x3 in no acute distress.  Dermatology: Hyperkeratotic lesion(s) present on the left sub-fifth MPJ. Pain on palpation with a central nucleated core noted. Skin is warm, dry and supple bilateral lower extremities. Negative for open lesions or macerations.  Vascular: Palpable pedal pulses bilaterally. No edema or erythema noted. Capillary refill within normal limits.  Neurological: Epicritic and protective threshold grossly intact bilaterally.   Musculoskeletal Exam: Sharp pain with palpation of the 2nd interspace and lateral compression of the metatarsal heads consistent with neuroma.  Positive Con[place] Canal sign  with loadbearing of the forefoot.  Assessment: 1. 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 Porokeratosis left sub-fifth MPJ   Plan of Care:  1. Patient was evaluated.   2. Excisional debridement of keratotic lesion(s) using a chisel blade was performed without incident. Light dressing applied.  3. Injection of 0.5 mLs Celestone Soluspan injected into the neuroma of the 2nd interspace left foot.  4. Recommended good shoe gear.  5. Return to clinic as needed.    Edrick Kins, DPM [facility]  Dr. Edrick Kins, [address]                                        [place], [place] [address]                Office [phone]  Fax [phone]

## 2020-04-07 ENCOUNTER — Ambulatory Visit
Admission: RE | Admit: 2020-04-07 | Discharge: 2020-04-07 | Disposition: A | Discharge status: Home / Self Care | Payer: Medicaid Other | Source: Ambulatory Visit | Attending: Internal Medicine | Admitting: Internal Medicine

## 2020-04-07 ENCOUNTER — Other Ambulatory Visit: Payer: Self-pay

## 2020-04-07 ENCOUNTER — Other Ambulatory Visit: Payer: Self-pay | Admitting: Internal Medicine

## 2020-04-07 DIAGNOSIS — N644 Mastodynia: Secondary | ICD-10-CM

## 2020-04-07 DIAGNOSIS — Z1231 Encounter for screening mammogram for malignant neoplasm of breast: Secondary | ICD-10-CM

## 2020-04-13 ENCOUNTER — Other Ambulatory Visit: Payer: Self-pay | Admitting: Internal Medicine

## 2020-04-18 ENCOUNTER — Telehealth: Payer: Self-pay | Admitting: Emergency Medicine

## 2020-04-18 ENCOUNTER — Other Ambulatory Visit: Payer: Self-pay | Admitting: Cardiology

## 2020-04-18 MED ORDER — AZITHROMYCIN 250 MG PO TABS: ORAL_TABLET | ORAL | 0 refills | Status: DC

## 2020-04-18 MED ORDER — PREDNISONE 10 MG PO TABS: ORAL_TABLET | ORAL | 0 refills | Status: DC

## 2020-04-18 NOTE — Telephone Encounter (Signed)
Zpak and pred Rx have been sent to preferred pharmacy for pt. Called and spoke with pt letting her know this had been done and she verbalized understanding. Stated to pt that we needed to get her scheduled for OV with either RB or APP and she verbalized understanding. OV scheduled for pt with TP 6/17. Nothing further needed.

## 2020-04-18 NOTE — Telephone Encounter (Signed)
Yes please order, and set her up with OV with RB or APP - televisit ok   Pred >> Take 40mg  daily for 3 days, then 30mg  daily for 3 days, then 20mg  daily for 3 days, then 10mg  daily for 3 days, then stop

## 2020-04-18 NOTE — Telephone Encounter (Signed)
Patient states her breathing got worse Tuesday of last week. Shortness of breath with exertion and would have to stop and take a break to get her breath back. Doing breathing treatments every 4 hours, using rescue inhaler and Trelegy and still taking 40mg  lasix. Productive cough with white sputum. Patient wears 3 liters oxygen  [name] please advise patient would like to get a zpzck and steroids called in

## 2020-04-19 ENCOUNTER — Other Ambulatory Visit: Payer: Self-pay | Admitting: Internal Medicine

## 2020-04-20 ENCOUNTER — Other Ambulatory Visit: Payer: Medicaid Other

## 2020-04-28 ENCOUNTER — Encounter: Payer: Self-pay | Admitting: Adult Health

## 2020-04-28 ENCOUNTER — Ambulatory Visit (INDEPENDENT_AMBULATORY_CARE_PROVIDER_SITE_OTHER): Payer: Medicaid Other | Admitting: Adult Health

## 2020-04-28 ENCOUNTER — Other Ambulatory Visit: Payer: Self-pay

## 2020-04-28 DIAGNOSIS — J441 Chronic obstructive pulmonary disease with (acute) exacerbation: Secondary | ICD-10-CM

## 2020-04-28 NOTE — Progress Notes (Signed)
Virtual Visit via Telephone Note  I connected with Mable Paris on 04/28/20 at  9:30 AM EDT by telephone and verified that I am speaking with the correct person using two identifiers.  Location: Patient: Home  Provider: Home Office    I discussed the limitations, risks, security and privacy concerns of performing an evaluation and management service by telephone and the availability of in person appointments. I also discussed with the patient that there may be a patient responsible charge related to this service. The patient expressed understanding and agreed to proceed.   History of Present Illness: 64 year old female former smoker followed for COPD with an asthmatic component, chronic cough and chronic respiratory failure on oxygen Medical history significant for diastolic dysfunction  Today's televisit is a follow-up from recent COPD exacerbation.  Patient called in to the office on June 7 with increased cough congestion and shortness of breath.  She was called in a Z-Pak and prednisone taper.  Patient says she is starting to feel better.  She has decreased cough and congestion.  She has 1 day left of her prednisone.  She says her wheezing has decreased quite a bit.  She denies any chest pain orthopnea PND or increased leg swelling.  Patient has had 2 flareups of her COPD over the last 6 weeks.  She relates this to the hot temperatures and poor air quality.  She denies any fever, body aches, loss of taste or smell.  Last chest x-ray was in October 2020 with no acute process She remains on oxygen 3 L at rest and 4 L with activity.  Denies any increased oxygen demands.  O2 saturations at home have been doing well.   Observations/Objective: Speaks in full sentences with no audible distress or wheezing.  08/23/2017-echocardiogram-LV ejection fraction 60 to 65%  11/16/2013-pulmonary function test-FVC 1.58 (67% predicted), postbronchodilator ratio 60, postbronchodilator FEV1 1.00 (53%  predicted), no significant bronchodilator response, mid flow reversibility after administration of bronchodilator, DLCO 39  CT chest October 2020 -Negfor PE,+emphysema with no acute process.  Assessment and Plan: Recurrent COPD exacerbation now improving.  Patient is to finish up her prednisone.  Continue on her maintenance therapy with Trelegy. Check chest x-ray.  Chronic respiratory failure on oxygen.  No increased oxygen mans.  Continue current settings  Plan  Patient Instructions  Chest Xray today .  Continue on TRELEGY 1 puff daily , rinse after use.  Wear Oxygen 3lm rest and 4/m with activity .  Work on healthy weight loss.  Saline nasal rinses Twice daily  And saline nasal gel.  Advance activity as tolerated.  Follow up [name]  In 2-3  month and As needed   Please contact office for sooner follow up if symptoms do not improve or worsen or seek emergency care       Follow Up Instructions: Follow-up in 2 to 3 months and as needed  Follow-up in 2 to 3 months and as needed   I discussed the assessment and treatment plan with the patient. The patient was provided an opportunity to ask questions and all were answered. The patient agreed with the plan and demonstrated an understanding of the instructions.   The patient was advised to call back or seek an in-person evaluation if the symptoms worsen or if the condition fails to improve as anticipated.  I provided 22  minutes of non-face-to-face time during this encounter.   Rexene Edison, NP

## 2020-04-28 NOTE — Patient Instructions (Addendum)
Chest Xray today .  Continue on TRELEGY 1 puff daily , rinse after use.  Wear Oxygen 3lm rest and 4/m with activity .  Work on healthy weight loss.  Saline nasal rinses Twice daily  And saline nasal gel.  Advance activity as tolerated.  Follow up [name]  In 2-3  month and As needed   Please contact office for sooner follow up if symptoms do not improve or worsen or seek emergency care

## 2020-04-29 ENCOUNTER — Ambulatory Visit (INDEPENDENT_AMBULATORY_CARE_PROVIDER_SITE_OTHER)
Admission: RE | Admit: 2020-04-29 | Discharge: 2020-04-29 | Disposition: A | Discharge status: Home / Self Care | Payer: Medicaid Other | Source: Ambulatory Visit | Attending: Adult Health | Admitting: Adult Health

## 2020-04-29 ENCOUNTER — Other Ambulatory Visit: Payer: Self-pay

## 2020-04-29 DIAGNOSIS — J441 Chronic obstructive pulmonary disease with (acute) exacerbation: Secondary | ICD-10-CM | POA: Diagnosis not present

## 2020-04-29 NOTE — Progress Notes (Signed)
I agree with assessment and plans.   [name], MD, PhD 04/29/2020, 3:30 PM [facility] [phone] or if no answer [phone]

## 2020-05-04 ENCOUNTER — Other Ambulatory Visit: Payer: Medicaid Other

## 2020-05-08 NOTE — Progress Notes (Signed)
Virtual Visit via telephone Note   This visit type was conducted due to national recommendations for restrictions regarding the COVID-19 Pandemic (e.g. social distancing) in an effort to limit this patient's exposure and mitigate transmission in our community.  Due to her co-morbid illnesses, this patient is at least at moderate risk for complications without adequate follow up.  This format is felt to be most appropriate for this patient at this time.  All issues noted in this document were discussed and addressed.  A limited physical exam was performed with this format.  Please refer to the patient's chart for her consent to telehealth for [facility].   Evaluation Performed:  Follow-up visit  This visit type was conducted due to national recommendations for restrictions regarding the COVID-19 Pandemic (e.g. social distancing).  This format is felt to be most appropriate for this patient at this time.  All issues noted in this document were discussed and addressed.  No physical exam was performed (except for noted visual exam findings with Video Visits).  Please refer to the patient's chart (MyChart message for video visits and phone note for telephone visits) for the patient's consent to telehealth for [facility].  Date:  05/09/2020   ID:  Stephanie Schultz, Stephanie Schultz 1955-12-27, MRN [id number]  Patient Location:  Home  Provider location:   [place]  PCP:  [name], MD  Cardiologist:  Fransico Him, MD  Electrophysiologist:  None   Chief Complaint:  CHF  History of Present Illness:    Stephanie Schultz is a 64 y.o. female who presents via audio/video conferencing for a telehealth visit today.    [name] is a63y/o AAFwith a history of DM2, HTN, hyperlipidemia, COPD with chronic respiratory failure on chronic O2, OSA on CPAP, prior tobacco abuse, chronic diastolic CHF and morbid obesity. She had a cath in 2009 showing normal coronary arteries and normal LVF.  She had recurrent CP and underwent repeat LHC 12/03/16 with normal coronaries, EF 55-65%, normal LVEDP.Based on symptoms, increased O2 requirements and increased sputum production, she was felt to have an acute COPD exacerbation and was treated with improvement in symptoms.  She is here today for followup and is doing well. She continues to have chronic atpyical chest pain that only occurs when she takes prednisone and not always.  She seems to get the pain in her chest with her COPD exacerbations when she needs Prednisone.  She has had worsening congestion her lungs recently with cough and was treated with antibx and steroids a few weeks ago by [name].   She denies any PND, orthopnea, dizziness (except for vertigo), palpitations or syncope. Her left leg and ankle chronically swell but seems to be worse over the past [redacted] weeks along with pain that wakes her up at night.  She is compliant with her meds and is tolerating meds with no SE.    Prior CV studies:   The following studies were reviewed today:  None  Past Medical History:  Diagnosis Date  . Anemia    chronis  . Anxiety   . Arthritis   . Cervical cancer ([place]) 1990   cervical   . Chronic diastolic CHF (congestive heart failure) ([place]) 08/10/2016  . COPD (chronic obstructive pulmonary disease) ([place])    O2 dependent. Pulmo: [name]  . Diabetes mellitus without complication ([place])    a. A1c 8.3 in 11/2015  . Emphysema   . Gallstones    s/p cholecystectomy  . GERD (gastroesophageal  reflux disease)   . Gout   . History of home oxygen therapy    "2.5L; 24/7" (08/10/2016)  . Hx of cardiac catheterization    a. LHC at [facility] in [place] 09/2008:  Normal coronary arteries EF 70%. b. LHC 11/2016: normal cors, normal LVEDP, EF 55-65%.  . Hyperlipidemia   . Hypertension   . Hypoxia 11/17/2018  . Morbid obesity ([place])   . OSA on CPAP    CPAP at night   . Pneumonia   . Sickle cell trait ([place])   . Tobacco abuse    a. up to 3ppd from age  3 to 41, now 1/4 ppd (01/2013) >> Quit 10/2015   Past Surgical History:  Procedure Laterality Date  . BREAST BIOPSY    . CARDIAC CATHETERIZATION    . CARDIAC CATHETERIZATION N/A 12/03/2016   Procedure: Left Heart Cath and Coronary Angiography;  Surgeon: Peter M Martinique, MD;  Location: [facility];  Service: Cardiovascular;  Laterality: N/A;  . CHOLECYSTECTOMY N/A 12/13/2015   Procedure: LAPAROSCOPIC CHOLECYSTECTOMY;  Surgeon: Ralene Ok, MD;  Location: [facility];  Service: General;  Laterality: N/A;  . COLONOSCOPY  09/05/2012   Procedure: COLONOSCOPY;  Surgeon: Beryle Beams, MD;  Location: [facility] ENDOS[facility];  Service: Endoscopy;  Laterality: N/A;  . COLONOSCOPY WITH PROPOFOL N/A 09/02/2015   Procedure: COLONOSCOPY WITH PROPOFOL;  Surgeon: Carol Ada, MD;  Location: [facility] ENDOS[facility];  Service: Endoscopy;  Laterality: N/A;  . ESOPHAGOGASTRODUODENOSCOPY (EGD) WITH PROPOFOL N/A 03/29/2017   Procedure: ESOPHAGOGASTRODUODENOSCOPY (EGD) WITH PROPOFOL;  Surgeon: Carol Ada, MD;  Location: [facility] END[facility];  Service: Endoscopy;  Laterality: N/A;  . TUBAL LIGATION       Current Meds  Medication Sig  . albuterol (PROVENTIL) (2.5 MG/3ML) 0.083% nebulizer solution USE 1 VIAL VIA NEBULIZER 4 TIMES DAILY AS NEEDED.  Marland Kitchen albuterol (VENTOLIN HFA) 108 (90 Base) MCG/ACT inhaler Inhale 2 puffs into the lungs every 4 (four) hours as needed for wheezing or shortness of breath.  . allopurinol (ZYLOPRIM) 100 MG tablet Take 100 mg by mouth daily.  Marland Kitchen ALPRAZolam (XANAX) 1 MG tablet Take 1 mg by mouth at bedtime as needed for sleep.   Marland Kitchen aspirin EC 81 MG tablet Take 81 mg by mouth daily.  Marland Kitchen azithromycin (ZITHROMAX) 250 MG tablet Take 2 today and then one daily until finished  . cetirizine (ZYRTEC) 10 MG tablet Take 10 mg by mouth daily.  . Cholecalciferol (VITAMIN D PO) Take 1 tablet by mouth daily.  . clobetasol cream (TEMOVATE) 9.32 % Apply 1 application topically daily as needed (irritation from pull ups).   .  esomeprazole (NEXIUM) 40 MG capsule Take 40 mg by mouth daily at 12 noon.  . fluticasone (FLONASE) 50 MCG/ACT nasal spray Place 1 spray into both nostrils daily as needed (congestion).  . Fluticasone-Umeclidin-Vilant (TRELEGY ELLIPTA) 100-62.5-25 MCG/INH AEPB Take 1 puff by mouth daily.  . furosemide (LASIX) 40 MG tablet TAKE 1 TABLET IN THE AM AND 1/2 TABLET IN PM.  . glipiZIDE (GLUCOTROL) 10 MG tablet Take 10 mg by mouth 2 (two) times daily before a meal.   . HYDROcodone-acetaminophen (NORCO) 7.5-325 MG tablet Take 1 tablet by mouth every 6 (six) hours as needed for moderate pain.  Marland Kitchen insulin glargine (LANTUS) 100 unit/mL SOPN Inject 18 Units into the skin at bedtime.   . insulin lispro (HUMALOG) 100 UNIT/ML injection Inject 6-10 Units into the skin once. 6units for sugar over 200 10 units for sugar over 300  . Linaclotide (  LINZESS) 145 MCG CAPS capsule Take 145 mcg by mouth daily.   . meclizine (ANTIVERT) 25 MG tablet Take 25 mg by mouth every 6 (six) hours as needed.  . Melatonin 2.5 MG CHEW Chew 2.5 mg by mouth See admin instructions. Only takes when waking up in middle of the night  . metoprolol succinate (TOPROL-XL) 50 MG 24 hr tablet TAKE 1 TABLET DAILY. TAKE WITH A MEAL OR IMMEDIATELY FOLLOWING A MEAL.  . mupirocin ointment (BACTROBAN) 2 % mupirocin 2 % topical ointment  apply to NOSE twice a day  . ondansetron (ZOFRAN ODT) 4 MG disintegrating tablet Take 1 tablet (4 mg total) by mouth every 8 (eight) hours as needed for nausea or vomiting.  . OXYGEN Inhale 2.5 L into the lungs continuous.   Marland Kitchen PAZEO 0.7 % SOLN Place 1 drop into both eyes at bedtime.  . Potassium Chloride ER 20 MEQ TBCR Take 20 mEq by mouth daily.  . predniSONE (DELTASONE) 10 MG tablet Take 40mg  for 3 days, then 30mg  for 3 days, 20mg  for 3 days, 10mg  for 3 days, then stop  . Respiratory Therapy Supplies (FLUTTER) DEVI 1 Device by Does not apply route 3 (three) times daily.  . sitaGLIPtin (JANUVIA) 100 MG tablet Take 100  mg by mouth daily.  Marland Kitchen spironolactone (ALDACTONE) 25 MG tablet Take 0.5 tablets (12.5 mg total) by mouth daily.  Marland Kitchen zolpidem (AMBIEN) 10 MG tablet Take 10 mg by mouth at bedtime.     Allergies:   Acetaminophen   Social History   Tobacco Use  . Smoking status: Former Smoker    Packs/day: 3.00    Years: 36.00    Pack years: 108.00    Types: Cigarettes    Quit date: 11/11/2015    Years since quitting: 4.4  . Smokeless tobacco: Never Used  . Tobacco comment: Approx 90 pk-yrs (up to 3ppd until ~ 2009). Smoking 3 cigs per day now.  Vaping Use  . Vaping Use: Never used  Substance Use Topics  . Alcohol use: No    Alcohol/week: 0.0 standard drinks  . Drug use: No     Family Hx: The patient's family history includes Diabetes in her mother; Lung cancer in her paternal aunt and paternal grandfather; Myasthenia gravis in her mother; Other in her father and another family member. There is no history of Heart attack, Heart failure, or Breast cancer.  ROS:   Please see the history of present illness.     All other systems reviewed and are negative.   Labs/Other Tests and Data Reviewed:    Recent Labs: 08/31/2019: B Natriuretic Peptide 11.3; Hemoglobin 11.5; Platelets 231 12/11/2019: BUN 13; Creatinine, Ser 0.82; Potassium 4.0; Sodium 144   Recent Lipid Panel Lab Results  Component Value Date/Time   CHOL 119 12/01/2016 02:05 AM   TRIG 212 (H) 12/01/2016 02:05 AM   HDL 34 (L) 12/01/2016 02:05 AM   CHOLHDL 3.5 12/01/2016 02:05 AM   LDLCALC 43 12/01/2016 02:05 AM    Wt Readings from Last 3 Encounters:  05/09/20 212 lb (96.2 kg)  03/15/20 212 lb 12.8 oz (96.5 kg)  12/15/19 213 lb 9.6 oz (96.9 kg)     Objective:    Vital Signs:  Ht 5' (1.524 m)   Wt 212 lb (96.2 kg)   LMP 06/04/2000 (LMP Unknown) Comment: tubal ligation  BMI 41.40 kg/m     ASSESSMENT & PLAN:    1.   Chronic diastolic CHF -she has chronic DOE due to  underlying COPD and SOB seems worse -she has had  increased LE edema in only her one leg and ankle with pain that keeps her up at night -her weight is stable -continue Lasix 40mg  qam and 20mg  qpm -.creatinine stable at 0.82 in Jan 2021  2.  Hypertension -continue Toprol XL 50mg  daily and spiro 12.5mg  daily  3.  Obesity  -I have encouraged her to get into a routine exercise program and cut back on carbs and portions.   4.  Chronic shortness of breath -her breathing has worsened recently and was treated with antbx and steroids but her cough and congestion are still bad -she is worried that she may have fluid in her lungs>>Cxray showed no edema -I will check a BNP to rule out mild volume overload, BMET, CBC and TSH to look for other causes of SOB -I will get her back in with [name] today -check 2D echo to make sure that LVF is still normal and rule out pericardial effusion   -she remains on home O2  5.  Diabetes mellitus type 2 -this is followed by her PCP.   -She will continue on glipizide 10 mg BID as well as Lantus insulin 18 units at bedtime as well as Januvia 100 mg daily.  6.  LE edema -this is mainly unilateral and she has pain in the leg as well -I am concerned that she may have a DVT so I will get LE venous dopplers to rule out DVT -I have recommended that she sees her PCP this week  COVID-19 Education: The signs and symptoms of COVID-19 were discussed with the patient and how to seek care for testing (follow up with PCP or arrange E-visit).  The importance of social distancing was discussed today.  Patient Risk:   After full review of this patient's clinical status, I feel that they are at least moderate risk at this time.  Time:   Today, I have spent 20 minutes on telemedicine discussing medical problems including CHF, HTN, SOB, obesity as well as reviewing labs from Jan 2021  Medication Adjustments/Labs and Tests Ordered: Current medicines are reviewed at length with the patient today.  Concerns regarding medicines  are outlined above.  Tests Ordered: No orders of the defined types were placed in this encounter.  Medication Changes: No orders of the defined types were placed in this encounter.   Disposition:  Follow up in 6 month(s)  Signed, Fransico Him, MD  05/09/2020 8:48 AM    [facility]

## 2020-05-09 ENCOUNTER — Telehealth (INDEPENDENT_AMBULATORY_CARE_PROVIDER_SITE_OTHER): Payer: Medicaid Other | Admitting: Cardiology

## 2020-05-09 ENCOUNTER — Other Ambulatory Visit: Payer: Self-pay

## 2020-05-09 ENCOUNTER — Telehealth: Payer: Self-pay

## 2020-05-09 ENCOUNTER — Encounter: Payer: Self-pay | Admitting: Cardiology

## 2020-05-09 VITALS — Ht 60.0 in | Wt 212.0 lb

## 2020-05-09 DIAGNOSIS — I5032 Chronic diastolic (congestive) heart failure: Secondary | ICD-10-CM

## 2020-05-09 DIAGNOSIS — R0602 Shortness of breath: Secondary | ICD-10-CM | POA: Diagnosis not present

## 2020-05-09 DIAGNOSIS — R600 Localized edema: Secondary | ICD-10-CM

## 2020-05-09 DIAGNOSIS — Z794 Long term (current) use of insulin: Secondary | ICD-10-CM

## 2020-05-09 DIAGNOSIS — I10 Essential (primary) hypertension: Secondary | ICD-10-CM | POA: Diagnosis not present

## 2020-05-09 DIAGNOSIS — N181 Chronic kidney disease, stage 1: Secondary | ICD-10-CM

## 2020-05-09 DIAGNOSIS — E1122 Type 2 diabetes mellitus with diabetic chronic kidney disease: Secondary | ICD-10-CM

## 2020-05-09 NOTE — Addendum Note (Signed)
Addended by: Antonieta Iba on: 05/09/2020 09:18 AM   Modules accepted: Orders

## 2020-05-09 NOTE — Patient Instructions (Signed)
Medication Instructions:  Your physician recommends that you continue on your current medications as directed. Please refer to the Current Medication list given to you today.  *If you need a refill on your cardiac medications before your next appointment, please call your pharmacy*   Lab Work: TSH, CBC, D-dimer, BMET, BNP If you have labs (blood work) drawn today and your tests are completely normal, you will receive your results only by: Marland Kitchen MyChart Message (if you have MyChart) OR . A paper copy in the mail If you have any lab test that is abnormal or we need to change your treatment, we will call you to review the results.   Testing/Procedures: Your physician has requested that you have an echocardiogram. Echocardiography is a painless test that uses sound waves to create images of your heart. It provides your doctor with information about the size and shape of your heart and how well your heart's chambers and valves are working. This procedure takes approximately one hour. There are no restrictions for this procedure.  Your physician has requested that you have a lower extremity venous duplex. This test is an ultrasound of the veins in the legs. It looks at venous blood flow that carries blood from the heart to the legs. Allow one hour for a Lower Venous exam. There are no restrictions or special instructions.   Follow-Up: At [facility], you and your health needs are our priority.  As part of our continuing mission to provide you with exceptional heart care, we have created designated Provider Care Teams.  These Care Teams include your primary Cardiologist (physician) and Advanced Practice Providers (APPs -  Physician Assistants and Nurse Practitioners) who all work together to provide you with the care you need, when you need it.  We recommend signing up for the patient portal called "MyChart".  Sign up information is provided on this After Visit Summary.  MyChart is used to connect  with patients for Virtual Visits (Telemedicine).  Patients are able to view lab/test results, encounter notes, upcoming appointments, etc.  Non-urgent messages can be sent to your provider as well.   To learn more about what you can do with MyChart, go to [email or web address].    Your next appointment:   6 month(s)  The format for your next appointment:   In Person  Provider:   You may see Fransico Him, MD or one of the following Advanced Practice Providers on your designated Care Team:    Melina Copa, PA-C  Ermalinda Barrios, PA-C

## 2020-05-09 NOTE — Telephone Encounter (Signed)
  Patient Consent for Virtual Visit         Stephanie Schultz has provided verbal consent on 05/09/2020 for a virtual visit (video or telephone).   CONSENT FOR VIRTUAL VISIT FOR:  Stephanie Schultz  By participating in this virtual visit I agree to the following:  I hereby voluntarily request, consent and authorize [facility] and its employed or contracted physicians, physician assistants, nurse practitioners or other licensed health care professionals (the Practitioner), to provide me with telemedicine health care services (the "Services") as deemed necessary by the treating Practitioner. I acknowledge and consent to receive the Services by the Practitioner via telemedicine. I understand that the telemedicine visit will involve communicating with the Practitioner through live audiovisual communication technology and the disclosure of certain medical information by electronic transmission. I acknowledge that I have been given the opportunity to request an in-person assessment or other available alternative prior to the telemedicine visit and am voluntarily participating in the telemedicine visit.  I understand that I have the right to withhold or withdraw my consent to the use of telemedicine in the course of my care at any time, without affecting my right to future care or treatment, and that the Practitioner or I may terminate the telemedicine visit at any time. I understand that I have the right to inspect all information obtained and/or recorded in the course of the telemedicine visit and may receive copies of available information for a reasonable fee.  I understand that some of the potential risks of receiving the Services via telemedicine include:  Marland Kitchen Delay or interruption in medical evaluation due to technological equipment failure or disruption; . Information transmitted may not be sufficient (e.g. poor resolution of images) to allow for appropriate medical decision making by the  Practitioner; and/or  . In rare instances, security protocols could fail, causing a breach of personal health information.  Furthermore, I acknowledge that it is my responsibility to provide information about my medical history, conditions and care that is complete and accurate to the best of my ability. I acknowledge that Practitioner's advice, recommendations, and/or decision may be based on factors not within their control, such as incomplete or inaccurate data provided by me or distortions of diagnostic images or specimens that may result from electronic transmissions. I understand that the practice of medicine is not an exact science and that Practitioner makes no warranties or guarantees regarding treatment outcomes. I acknowledge that a copy of this consent can be made available to me via my patient portal ([facility]), or I can request a printed copy by calling the office of [facility].    I understand that my insurance will be billed for this visit.   I have read or had this consent read to me. . I understand the contents of this consent, which adequately explains the benefits and risks of the Services being provided via telemedicine.  . I have been provided ample opportunity to ask questions regarding this consent and the Services and have had my questions answered to my satisfaction. . I give my informed consent for the services to be provided through the use of telemedicine in my medical care

## 2020-05-10 ENCOUNTER — Telehealth: Payer: Self-pay

## 2020-05-10 ENCOUNTER — Other Ambulatory Visit: Payer: Self-pay

## 2020-05-10 ENCOUNTER — Other Ambulatory Visit: Payer: Medicaid Other

## 2020-05-10 ENCOUNTER — Telehealth: Payer: Self-pay | Admitting: Cardiology

## 2020-05-10 DIAGNOSIS — R0602 Shortness of breath: Secondary | ICD-10-CM

## 2020-05-10 LAB — BASIC METABOLIC PANEL
BUN/Creatinine Ratio: 14 (ref 12–28)
BUN: 14 mg/dL (ref 8–27)
CO2: 34 mmol/L — ABNORMAL HIGH (ref 20–29)
Calcium: 9.9 mg/dL (ref 8.7–10.3)
Chloride: 100 mmol/L (ref 96–106)
Creatinine, Ser: 1.02 mg/dL — ABNORMAL HIGH (ref 0.57–1.00)
GFR calc Af Amer: 69 mL/min/{1.73_m2} (ref >59)
GFR calc non Af Amer: 60 mL/min/{1.73_m2} (ref >59)
Glucose: 162 mg/dL — ABNORMAL HIGH (ref 65–99)
Potassium: 4.2 mmol/L (ref 3.5–5.2)
Sodium: 143 mmol/L (ref 134–144)

## 2020-05-10 LAB — D-DIMER, QUANTITATIVE: D-DIMER: 0.74 mg/L FEU — ABNORMAL HIGH (ref 0.00–0.49)

## 2020-05-10 LAB — CBC
Hematocrit: 36.9 % (ref 34.0–46.6)
Hemoglobin: 12.3 g/dL (ref 11.1–15.9)
MCH: 28.6 pg (ref 26.6–33.0)
MCHC: 33.3 g/dL (ref 31.5–35.7)
MCV: 86 fL (ref 79–97)
Platelets: 193 10*3/uL (ref 150–450)
RBC: 4.3 x10E6/uL (ref 3.77–5.28)
RDW: 15.6 % — ABNORMAL HIGH (ref 11.7–15.4)
WBC: 5.7 10*3/uL (ref 3.4–10.8)

## 2020-05-10 NOTE — Telephone Encounter (Signed)
-----   Message from Sueanne Margarita, MD sent at 05/10/2020 12:54 PM EDT ----- Labs look ok except for elevated ddimer.  Please get chest CTA today to rule out PE

## 2020-05-10 NOTE — Telephone Encounter (Signed)
The patient has been notified of the result and verbalized understanding.  All questions (if any) were answered. Antonieta Iba, RN 05/10/2020 1:36 PM

## 2020-05-10 NOTE — Telephone Encounter (Signed)
Stephanie Schultz is calling to inform Dr. Radford Pax the nurse did not schedule an appointment with her PCP for today but a later date. She states she was not aware of this until she went to her PCP office today thinking she was coming to an appointment. Stephanie Schultz canceled the upcoming appointment with her PCP that was scheduled due to the date being to far out.

## 2020-05-10 NOTE — Telephone Encounter (Signed)
Spoke with the patient who states that she went to her pulmonologist appointment this morning and they said it was not until 07/27.  I had called over to Dr. Golden Pop office yesterday and they told me that they had scheduled her for today at [place] to patient for the miscommunication . I will call back over to see if the patient can be seen sooner as she is having issues that need to be addressed by pulmonary as soon as possible.

## 2020-05-11 ENCOUNTER — Ambulatory Visit
Admission: RE | Admit: 2020-05-11 | Discharge: 2020-05-11 | Disposition: A | Discharge status: Home / Self Care | Payer: Medicaid Other | Source: Ambulatory Visit | Attending: Cardiology | Admitting: Cardiology

## 2020-05-11 DIAGNOSIS — R0602 Shortness of breath: Secondary | ICD-10-CM

## 2020-05-11 LAB — PRO B NATRIURETIC PEPTIDE: NT-Pro BNP: 10 pg/mL (ref 0–287)

## 2020-05-11 LAB — TSH: TSH: 1.33 u[IU]/mL (ref 0.450–4.500)

## 2020-05-11 MED ORDER — IOPAMIDOL (ISOVUE-370) INJECTION 76%
75.0000 mL | Freq: Once | INTRAVENOUS | Status: AC | PRN
  Administered 2020-05-11: 75 mL via INTRAVENOUS

## 2020-05-12 ENCOUNTER — Telehealth: Payer: Self-pay | Admitting: Cardiology

## 2020-05-12 NOTE — Telephone Encounter (Signed)
New Message  Patient is calling in to go over CT results. Please give patient a call back.

## 2020-05-12 NOTE — Telephone Encounter (Signed)
The patient has been notified of the result and verbalized understanding.  All questions (if any) were answered. Antonieta Iba, RN 05/12/2020 12:11 PM

## 2020-05-17 ENCOUNTER — Telehealth: Payer: Self-pay | Admitting: Primary Care

## 2020-05-17 ENCOUNTER — Other Ambulatory Visit: Payer: Self-pay

## 2020-05-17 ENCOUNTER — Ambulatory Visit (INDEPENDENT_AMBULATORY_CARE_PROVIDER_SITE_OTHER): Payer: Medicaid Other | Admitting: Primary Care

## 2020-05-17 ENCOUNTER — Encounter: Payer: Self-pay | Admitting: Primary Care

## 2020-05-17 DIAGNOSIS — J441 Chronic obstructive pulmonary disease with (acute) exacerbation: Secondary | ICD-10-CM

## 2020-05-17 MED ORDER — DOXYCYCLINE HYCLATE 100 MG PO TABS: 100.0000 mg | ORAL_TABLET | Freq: Two times a day (BID) | ORAL | 0 refills | Status: DC

## 2020-05-17 MED ORDER — PREDNISONE 10 MG PO TABS: ORAL_TABLET | ORAL | 0 refills | Status: DC

## 2020-05-17 NOTE — Progress Notes (Signed)
Virtual Visit via Telephone Note  I connected with Stephanie Schultz on 05/17/20 at  3:30 PM EDT by telephone and verified that I am speaking with the correct person using two identifiers.  Location: Patient: Home Provider: Office   I discussed the limitations, risks, security and privacy concerns of performing an evaluation and management service by telephone and the availability of in person appointments. I also discussed with the patient that there may be a patient responsible charge related to this service. The patient expressed understanding and agreed to proceed.   History of Present Illness: 64 year old female, former smoker quit in 2016 (108-pack-year history).  Significant for COPD component, chronic cough, chronic respiratory failure O2 dependent, pulmonary nodule, allergic rhinitis, chronic diastolic heart failure, hypertension, GERD, type 2 diabetes, obesity.  Patient of [name], pulmonary nurse practitioner on 04/28/2020 for virtual telephone visit.  During this visit she was feeling better after being treated for an exacerbation on June 7 with Z-Pak and prednisone.  Maintained on Trelegy, 3-4L/min continuous oxygen.  Recent COPD exacerbations: April 18, 2020- Z-Pak, prednisone taper Mar 15, 2020- Z-Pak January 19th, 2021-doxycycline, prednisone taper   05/17/2020-interim history Patient contacted today for acute telephone visit.  Patient has had several COPD flares recently. She reports increased chest tightness. She has been coughing up thick mucus with occasional yellow/green coloring.  She was previously taking Mucinex but states that this dries out the back of her throat.  She had a CTA with Dr. Radford Pax on June 30 that was negative for PE or acute process. It did show emphysema.  She is compliant with Trelegy Ellipta inhaler.  She has had no increased oxygen demand, continues to wear 3L/min at rest and 4L/min on exertoin.    Observations/Objective:  -Patient has mild  dyspnea when talking.  No overt wheezing or cough   TEST/EVENTS :  08/23/2017-echocardiogram-LV ejection fraction 60 to 65%  11/16/2013-pulmonary function test-FVC 1.58 (67% predicted), postbronchodilator ratio 60, postbronchodilator FEV1 1.00 (53% predicted), no significant bronchodilator response, mid flow reversibility after administration of bronchodilator, DLCO 39  CT chest October 2020 -Negfor PE,+emphysema with no acute process.   Assessment and Plan:  COPD exacerbation: - Increased chest tightness with productive cough; she has had more exacerbations over the last several months. We will go ahead and treat for acute COPD exacerbation with doxycycline and prednisone taper. - Advised patient to take Robitussin 5 mL every 4-6 hours for cough/chest congestion - RX doxycycline 1 tab twice daily x7 days; prednisone taper 40 mg x 3 days, 30 mg x 3 days, 20 mg x 3 days, 10 mg x 3 days - Recommend follow-up in 1 to 2 weeks, consider starting Daliresp at that time to prevent recurrent exacerbations  Follow Up Instructions:   Follow-up in 1 to 2 weeks  I discussed the assessment and treatment plan with the patient. The patient was provided an opportunity to ask questions and all were answered. The patient agreed with the plan and demonstrated an understanding of the instructions.   The patient was advised to call back or seek an in-person evaluation if the symptoms worsen or if the condition fails to improve as anticipated.  I provided 18 minutes of non-face-to-face time during this encounter.   Martyn Ehrich, NP

## 2020-05-17 NOTE — Patient Instructions (Addendum)
CT chest was negative for pulmonary embolism or acute process, it did however show emphysema which is from previous smoking history We will go ahead and treat for acute COPD exacerbation with doxycycline and prednisone taper Recommend follow-up in 1 to 2 weeks to discuss options to prevent recurrent exacerbations  Recommendation: Continue saline nasal spray twice daily Take regular Robitussin 5 mL every 4-6 hours for cough/ chest congestion  Rx: Prednisone taper as prescribed Doxycycline 1 tab twice daily for 1 week  Follow-up: 2 weeks with Stephanie Maize, NP      Chronic Obstructive Pulmonary Disease Chronic obstructive pulmonary disease (COPD) is a long-term (chronic) lung problem. When you have COPD, it is hard for air to get in and out of your lungs. Usually the condition gets worse over time, and your lungs will never return to normal. There are things you can do to keep yourself as healthy as possible.  Your doctor may treat your condition with: ? Medicines. ? Oxygen. ? Lung surgery.  Your doctor may also recommend: ? Rehabilitation. This includes steps to make your body work better. It may involve a team of specialists. ? Quitting smoking, if you smoke. ? Exercise and changes to your diet. ? Comfort measures (palliative care). Follow these instructions at home: Medicines  Take over-the-counter and prescription medicines only as told by your doctor.  Talk to your doctor before taking any cough or allergy medicines. You may need to avoid medicines that cause your lungs to be dry. Lifestyle  If you smoke, stop. Smoking makes the problem worse. If you need help quitting, ask your doctor.  Avoid being around things that make your breathing worse. This may include smoke, chemicals, and fumes.  Stay active, but remember to rest as well.  Learn and use tips on how to relax.  Make sure you get enough sleep. Most adults need at least 7 hours of sleep every night.  Eat healthy  foods. Eat smaller meals more often. Rest before meals. Controlled breathing Learn and use tips on how to control your breathing as told by your doctor. Try:  Breathing in (inhaling) through your nose for 1 second. Then, pucker your lips and breath out (exhale) through your lips for 2 seconds.  Putting one hand on your belly (abdomen). Breathe in slowly through your nose for 1 second. Your hand on your belly should move out. Pucker your lips and breathe out slowly through your lips. Your hand on your belly should move in as you breathe out.  Controlled coughing Learn and use controlled coughing to clear mucus from your lungs. Follow these steps: 1. Lean your head a little forward. 2. Breathe in deeply. 3. Try to hold your breath for 3 seconds. 4. Keep your mouth slightly open while coughing 2 times. 5. Spit any mucus out into a tissue. 6. Rest and do the steps again 1 or 2 times as needed. General instructions  Make sure you get all the shots (vaccines) that your doctor recommends. Ask your doctor about a flu shot and a pneumonia shot.  Use oxygen therapy and pulmonary rehabilitation if told by your doctor. If you need home oxygen therapy, ask your doctor if you should buy a tool to measure your oxygen level (oximeter).  Make a COPD action plan with your doctor. This helps you to know what to do if you feel worse than usual.  Manage any other conditions you have as told by your doctor.  Avoid going outside when it is very  hot, cold, or humid.  Avoid people who have a sickness you can catch (contagious).  Keep all follow-up visits as told by your doctor. This is important. Contact a doctor if:  You cough up more mucus than usual.  There is a change in the color or thickness of the mucus.  It is harder to breathe than usual.  Your breathing is faster than usual.  You have trouble sleeping.  You need to use your medicines more often than usual.  You have trouble doing your  normal activities such as getting dressed or walking around the house. Get help right away if:  You have shortness of breath while resting.  You have shortness of breath that stops you from: ? Being able to talk. ? Doing normal activities.  Your chest hurts for longer than 5 minutes.  Your skin color is more blue than usual.  Your pulse oximeter shows that you have low oxygen for longer than 5 minutes.  You have a fever.  You feel too tired to breathe normally. Summary  Chronic obstructive pulmonary disease (COPD) is a long-term lung problem.  The way your lungs work will never return to normal. Usually the condition gets worse over time. There are things you can do to keep yourself as healthy as possible.  Take over-the-counter and prescription medicines only as told by your doctor.  If you smoke, stop. Smoking makes the problem worse. This information is not intended to replace advice given to you by your health care provider. Make sure you discuss any questions you have with your health care provider. Document Revised: 10/11/2017 Document Reviewed: 12/03/2016 Elsevier Patient Education  2020 [employer].

## 2020-05-17 NOTE — Telephone Encounter (Signed)
Patient needs a follow-up in 1 to 2 weeks with myself in office if she has had covid vaccine, please call patient to set up appointment

## 2020-05-19 NOTE — Telephone Encounter (Signed)
ATC patient unable to reach. She has been vaccinated. Will call back.

## 2020-05-19 NOTE — Telephone Encounter (Signed)
Pt returned missed call. Scheduled appt w/ BW for 06/06/20. Nothing further needed.

## 2020-05-26 ENCOUNTER — Ambulatory Visit
Admission: RE | Admit: 2020-05-26 | Discharge: 2020-05-26 | Disposition: A | Discharge status: Home / Self Care | Payer: Medicaid Other | Source: Ambulatory Visit | Attending: Internal Medicine | Admitting: Internal Medicine

## 2020-05-26 ENCOUNTER — Other Ambulatory Visit: Payer: Self-pay | Admitting: Internal Medicine

## 2020-05-26 ENCOUNTER — Other Ambulatory Visit: Payer: Self-pay

## 2020-05-26 DIAGNOSIS — N644 Mastodynia: Secondary | ICD-10-CM

## 2020-06-01 ENCOUNTER — Other Ambulatory Visit: Payer: Self-pay

## 2020-06-01 ENCOUNTER — Ambulatory Visit (HOSPITAL_BASED_OUTPATIENT_CLINIC_OR_DEPARTMENT_OTHER): Payer: Medicaid Other

## 2020-06-01 DIAGNOSIS — R600 Localized edema: Secondary | ICD-10-CM | POA: Diagnosis present

## 2020-06-01 DIAGNOSIS — R0602 Shortness of breath: Secondary | ICD-10-CM

## 2020-06-01 LAB — ECHOCARDIOGRAM COMPLETE
Area-P 1/2: 3.03 cm2
S' Lateral: 2.7 cm

## 2020-06-01 MED ORDER — PERFLUTREN LIPID MICROSPHERE
1.0000 mL | INTRAVENOUS | Status: AC | PRN
  Administered 2020-06-01: 3 mL via INTRAVENOUS

## 2020-06-02 ENCOUNTER — Telehealth: Payer: Self-pay | Admitting: Cardiology

## 2020-06-02 ENCOUNTER — Ambulatory Visit (HOSPITAL_COMMUNITY)
Admission: RE | Admit: 2020-06-02 | Discharge: 2020-06-02 | Disposition: A | Discharge status: Home / Self Care | Payer: Medicaid Other | Source: Ambulatory Visit | Attending: Cardiology | Admitting: Cardiology

## 2020-06-02 DIAGNOSIS — R0602 Shortness of breath: Secondary | ICD-10-CM | POA: Diagnosis not present

## 2020-06-02 DIAGNOSIS — R600 Localized edema: Secondary | ICD-10-CM | POA: Insufficient documentation

## 2020-06-02 NOTE — Telephone Encounter (Signed)
The patient has been notified of the result and verbalized understanding.  All questions (if any) were answered. Antonieta Iba, RN 06/02/2020 1:13 PM

## 2020-06-02 NOTE — Telephone Encounter (Signed)
Patient returning call for echo results. 

## 2020-06-06 ENCOUNTER — Ambulatory Visit: Payer: Medicaid Other | Admitting: Primary Care

## 2020-06-06 ENCOUNTER — Encounter: Payer: Self-pay | Admitting: Primary Care

## 2020-06-06 ENCOUNTER — Other Ambulatory Visit: Payer: Self-pay

## 2020-06-06 ENCOUNTER — Telehealth: Payer: Self-pay | Admitting: Primary Care

## 2020-06-06 VITALS — BP 126/74 | HR 77 | Temp 97.8°F | Ht 60.0 in | Wt 217.2 lb

## 2020-06-06 DIAGNOSIS — J9611 Chronic respiratory failure with hypoxia: Secondary | ICD-10-CM | POA: Diagnosis not present

## 2020-06-06 DIAGNOSIS — J449 Chronic obstructive pulmonary disease, unspecified: Secondary | ICD-10-CM | POA: Diagnosis not present

## 2020-06-06 MED ORDER — DALIRESP 250 MCG PO TABS: 1.0000 | ORAL_TABLET | Freq: Every day | ORAL | 0 refills | Status: DC

## 2020-06-06 NOTE — Assessment & Plan Note (Addendum)
-   Increased frequency of COPD exacerbations, total of 4 this year alone. Last treated on 05/17/20 with Doxycycline and prednisone taper - Plan to start Daliresp 250 mcg daily x4 weeks and if tolerates increase to 500 mcg daily  - She will continue Trelegy Ellipta 1 puff daily in the morning.   - Recheck PFTs at next visit in 3 months - Follow-up in 4 weeks telephone visit with APP/ 3 months with DR. BYRUM

## 2020-06-06 NOTE — Assessment & Plan Note (Addendum)
-   Stable; continues to benefit from supplemental oxygen 2-4L/min

## 2020-06-06 NOTE — Progress Notes (Signed)
@Patient  ID: Stephanie Schultz, female    DOB: 1956/09/17, 64 y.o.   MRN: [id number]  Chief Complaint  Patient presents with  . Follow-up    Referring provider: [name], MD  HPI: 63 year old female, former smoker quit in 2016 (108-pack-year history).  Significant for COPD component, chronic cough, chronic respiratory failure O2 dependent, pulmonary nodule, allergic rhinitis, chronic diastolic heart failure, hypertension, GERD, type 2 diabetes, obesity.  Patient of [name], pulmonary nurse practitioner on 04/28/2020 for virtual telephone visit.  During this visit she was feeling better after being treated for an exacerbation on June 7th 2021 with Z-Pak and prednisone.  Maintained on Trelegy, 3-4L/min continuous oxygen.  Previous [facility] pulmonary encounters: 05/17/2020 Patient contacted today for acute telephone visit.  Patient has had several COPD flares recently. She reports increased chest tightness. She has been coughing up thick mucus with occasional yellow/green coloring.  She was previously taking Mucinex but states that this dries out the back of her throat.  She had a CTA with Dr. Radford Pax on June 30 that was negative for PE or acute process. It did show emphysema.  She is compliant with Trelegy Ellipta inhaler.  She has had no increased oxygen demand, continues to wear 3L/min at rest and 4L/min on exertoin.    06/06/2020- INTERIM HX Patient presents today for 2 to 3-week follow-up for COPD exacerbation. She is feeling a good deal better but still has some residual shortness of breath. Continues to use Trelegy Ellipta as prescribed once daily. Using 2-3L oxygen, states that she needs more oxygen tanks. She has been taking mucinex which has been helping her get up mucus. CTA in June was negative for PE but did show chronic emphysematous changes in upper lobes. She has gained 5 lbs. BNP was normal in June. Echocardiogram on 06/01/20 showed normal heart function. She has some mild leg  swelling. She had an ultrasound of her lower extremities which was negative for  DVT.    Recent COPD exacerbations: January 19th, 2021-doxycycline, prednisone taper Mar 15, 2020- Z-Pak April 18, 2020- Z-Pak, prednisone taper May 17, 2020- Doxycycline, prednisone taper    TEST/EVENTS :  Cardiac: > 08/23/2017-echocardiogram-LV ejection fraction 60 to 65% > 06/01/20- echocardiogram- LV ejection fraction 60-65%, mild concentric left ventricular hypertrophy, normal diastolic parameters   Pulmonary function testing: > 11/16/2013 PFTs-FVC 1.58 (67% predicted), postbronchodilator ratio 60, postbronchodilator FEV1 1.00 (53% predicted), no significant bronchodilator response, mid flow reversibility after administration of bronchodilator, DLCO 39 > 01/12/19 PFTs-FVC 1.18 (52%), FEV1 0.66 (37%), ratio 56, borderline bronchodilator response, DLCO correlated 6.07 (34%)  Imaging: > October 2020 CT chest -Negfor PE,+emphysema with no acute process. > 05/11/20 CTA- No PE, Chronic emphysematous changes in the upper lobes. Chronic linear scarring at the lung bases, left more than right. No effusions or acute infiltrates. Allergies  Allergen Reactions  . Acetaminophen Other (See Comments)    Pt was told that she could not take Tylenol, does not know why.    Immunization History  Administered Date(s) Administered  . Influenza Split 09/20/2011, 09/12/2012, 08/12/2013  . Influenza,inj,Quad PF,6+ Mos 08/03/2014, 08/12/2015, 08/12/2017, 07/29/2018  . Influenza,inj,Quad PF,6-35 Mos 09/11/2019  . PFIZER SARS-COV-2 Vaccination 02/05/2020, 02/25/2020  . Pneumococcal Conjugate-13 09/24/2017  . Pneumococcal Polysaccharide-23 11/12/2009    Past Medical History:  Diagnosis Date  . Anemia    chronis  . Anxiety   . Arthritis   . Cervical cancer (HCC) 1990   cervical   . Chronic diastolic CHF (congestive heart failure) (  [place]) 08/10/2016  . COPD (chronic obstructive pulmonary disease) ([place])    O2 dependent.  Pulmo: [name]  . Diabetes mellitus without complication ([place])    a. A1c 8.3 in 11/2015  . Emphysema   . Gallstones    s/p cholecystectomy  . GERD (gastroesophageal reflux disease)   . Gout   . History of home oxygen therapy    "2.5L; 24/7" (08/10/2016)  . Hx of cardiac catheterization    a. LHC at [facility] in [place], [place] 09/2008:  Normal coronary arteries EF 70%. b. LHC 11/2016: normal cors, normal LVEDP, EF 55-65%.  . Hyperlipidemia   . Hypertension   . Hypoxia 11/17/2018  . Morbid obesity ([place])   . OSA on CPAP    CPAP at night   . Pneumonia   . Sickle cell trait ([place])   . Tobacco abuse    a. up to 3ppd from age 51 to 51, now 1/4 ppd (01/2013) >> Quit 10/2015    Tobacco History: Social History   Tobacco Use  Smoking Status Former Smoker  . Packs/day: 3.00  . Years: 36.00  . Pack years: 108.00  . Types: Cigarettes  . Quit date: 11/11/2015  . Years since quitting: 4.5  Smokeless Tobacco Never Used  Tobacco Comment   Approx 90 pk-yrs (up to 3ppd until ~ 2009). Smoking 3 cigs per day now.   Counseling given: Not Answered Comment: Approx 90 pk-yrs (up to 3ppd until ~ 2009). Smoking 3 cigs per day now.   Outpatient Medications Prior to Visit  Medication Sig Dispense Refill  . albuterol (PROVENTIL) (2.5 MG/3ML) 0.083% nebulizer solution USE 1 VIAL VIA NEBULIZER 4 TIMES DAILY AS NEEDED. 300 mL 5  . albuterol (VENTOLIN HFA) 108 (90 Base) MCG/ACT inhaler Inhale 2 puffs into the lungs every 4 (four) hours as needed for wheezing or shortness of breath.    . allopurinol (ZYLOPRIM) 100 MG tablet Take 100 mg by mouth daily.    Marland Kitchen ALPRAZolam (XANAX) 1 MG tablet Take 1 mg by mouth at bedtime as needed for sleep.   0  . aspirin EC 81 MG tablet Take 81 mg by mouth daily.    Marland Kitchen azithromycin (ZITHROMAX) 250 MG tablet Take 2 today and then one daily until finished 6 tablet 0  . cetirizine (ZYRTEC) 10 MG tablet Take 10 mg by mouth daily.    . Cholecalciferol (VITAMIN D PO) Take 1 tablet by  mouth daily.    . clobetasol cream (TEMOVATE) 6.43 % Apply 1 application topically daily as needed (irritation from pull ups).   0  . doxycycline (VIBRA-TABS) 100 MG tablet Take 1 tablet (100 mg total) by mouth 2 (two) times daily. 14 tablet 0  . esomeprazole (NEXIUM) 40 MG capsule Take 40 mg by mouth daily at 12 noon.    . fluticasone (FLONASE) 50 MCG/ACT nasal spray Place 1 spray into both nostrils daily as needed (congestion).    . Fluticasone-Umeclidin-Vilant (TRELEGY ELLIPTA) 100-62.5-25 MCG/INH AEPB Take 1 puff by mouth daily. 60 each 5  . furosemide (LASIX) 40 MG tablet TAKE 1 TABLET IN THE AM AND 1/2 TABLET IN PM. 135 tablet 2  . glipiZIDE (GLUCOTROL) 10 MG tablet Take 10 mg by mouth 2 (two) times daily before a meal.     . HYDROcodone-acetaminophen (NORCO) 7.5-325 MG tablet Take 1 tablet by mouth every 6 (six) hours as needed for moderate pain.    Marland Kitchen insulin glargine (LANTUS) 100 unit/mL SOPN Inject 18 Units into the skin at  bedtime.     . insulin lispro (HUMALOG) 100 UNIT/ML injection Inject 6-10 Units into the skin once. 6units for sugar over 200 10 units for sugar over 300    . Linaclotide (LINZESS) 145 MCG CAPS capsule Take 145 mcg by mouth daily.     . meclizine (ANTIVERT) 25 MG tablet Take 25 mg by mouth every 6 (six) hours as needed.    . Melatonin 2.5 MG CHEW Chew 2.5 mg by mouth See admin instructions. Only takes when waking up in middle of the night    . metoprolol succinate (TOPROL-XL) 50 MG 24 hr tablet TAKE 1 TABLET DAILY. TAKE WITH A MEAL OR IMMEDIATELY FOLLOWING A MEAL. 30 tablet 5  . mupirocin ointment (BACTROBAN) 2 % mupirocin 2 % topical ointment  apply to NOSE twice a day    . ondansetron (ZOFRAN ODT) 4 MG disintegrating tablet Take 1 tablet (4 mg total) by mouth every 8 (eight) hours as needed for nausea or vomiting. 20 tablet 0  . OXYGEN Inhale 2.5 L into the lungs continuous.     Marland Kitchen PAZEO 0.7 % SOLN Place 1 drop into both eyes at bedtime.    . Potassium Chloride ER  20 MEQ TBCR Take 20 mEq by mouth daily. 90 tablet 3  . predniSONE (DELTASONE) 10 MG tablet Take 40mg  for 3 days, then 30mg  for 3 days, 20mg  for 3 days, 10mg  for 3 days, then stop 30 tablet 0  . predniSONE (DELTASONE) 10 MG tablet Take 4 tabs po daily x 3 days; then 3 tabs daily x3 days; then 2 tabs daily x3 days; then 1 tab daily x 3 days; then stop 30 tablet 0  . Respiratory Therapy Supplies (FLUTTER) DEVI 1 Device by Does not apply route 3 (three) times daily. 1 each 0  . sitaGLIPtin (JANUVIA) 100 MG tablet Take 100 mg by mouth daily.    Marland Kitchen spironolactone (ALDACTONE) 25 MG tablet Take 0.5 tablets (12.5 mg total) by mouth daily. 15 tablet 12  . zolpidem (AMBIEN) 10 MG tablet Take 10 mg by mouth at bedtime.     No facility-administered medications prior to visit.   Review of Systems  Review of Systems  Constitutional: Negative for chills, diaphoresis, fatigue and fever.  Respiratory: Positive for cough and shortness of breath. Negative for chest tightness.   Cardiovascular: Positive for leg swelling.    Physical Exam  BP 126/74 (BP Location: Left Arm, Cuff Size: Normal)   Pulse 77   Temp 97.8 F (36.6 C) (Oral)   Ht 5' (1.524 m)   Wt (!) 217 lb 3.2 oz (98.5 kg)   LMP 06/04/2000 (LMP Unknown) Comment: tubal ligation  SpO2 95%   BMI 42.42 kg/m  Physical Exam Constitutional:      Appearance: Normal appearance.  Pulmonary:     Effort: Pulmonary effort is normal.     Breath sounds: Normal breath sounds. No rhonchi.     Comments: Lungs clear, upper airway wheeze; O2 95% on 3L portable oxygen tank Neurological:     General: No focal deficit present.     Mental Status: She is alert and oriented to person, place, and time. Mental status is at baseline.  Psychiatric:        Mood and Affect: Mood normal.        Behavior: Behavior normal.        Thought Content: Thought content normal.        Judgment: Judgment normal.      Lab Results:  CBC    Component Value Date/Time   WBC  5.7 05/10/2020 1004   WBC 6.2 08/31/2019 1800   RBC 4.30 05/10/2020 1004   RBC 4.07 08/31/2019 1800   HGB 12.3 05/10/2020 1004   HCT 36.9 05/10/2020 1004   PLT 193 05/10/2020 1004   MCV 86 05/10/2020 1004   MCH 28.6 05/10/2020 1004   MCH 28.3 08/31/2019 1800   MCHC 33.3 05/10/2020 1004   MCHC 33.1 08/31/2019 1800   RDW 15.6 (H) 05/10/2020 1004   LYMPHSABS 1.8 08/31/2019 1800   MONOABS 0.6 08/31/2019 1800   EOSABS 0.3 08/31/2019 1800   BASOSABS 0.0 08/31/2019 1800    BMET    Component Value Date/Time   NA 143 05/10/2020 1004   K 4.2 05/10/2020 1004   CL 100 05/10/2020 1004   CO2 34 (H) 05/10/2020 1004   GLUCOSE 162 (H) 05/10/2020 1004   GLUCOSE 127 (H) 08/31/2019 1800   BUN 14 05/10/2020 1004   CREATININE 1.02 (H) 05/10/2020 1004   CALCIUM 9.9 05/10/2020 1004   GFRNONAA 60 05/10/2020 1004   GFRAA 69 05/10/2020 1004    BNP    Component Value Date/Time   BNP 11.3 08/31/2019 1800    ProBNP    Component Value Date/Time   PROBNP 10 05/10/2020 1004   PROBNP 16.1 07/05/2014 1230    Imaging: CT ANGIO CHEST PE W OR WO CONTRAST  Result Date: 05/11/2020 CLINICAL DATA:  Shortness of breath.  COPD.  Elevated D-dimer. EXAM: CT ANGIOGRAPHY CHEST WITH CONTRAST TECHNIQUE: Multidetector CT imaging of the chest was performed using the standard protocol during bolus administration of intravenous contrast. Multiplanar CT image reconstructions and MIPs were obtained to evaluate the vascular anatomy. CONTRAST:  34mL ISOVUE-370 IOPAMIDOL (ISOVUE-370) INJECTION 76% COMPARISON:  CT scan dated 08/31/2019 FINDINGS: Cardiovascular: Satisfactory opacification of the pulmonary arteries to the segmental level. No evidence of pulmonary embolism. Normal heart size. No pericardial effusion. Aortic atherosclerosis. Mediastinum/Nodes: No enlarged mediastinal, hilar, or axillary lymph nodes. Thyroid gland, trachea, and esophagus demonstrate no significant findings. Lungs/Pleura: Chronic emphysematous  changes in the upper lobes. Chronic linear scarring at the lung bases, left more than right. No effusions or acute infiltrates. Upper Abdomen: Chronic hepatomegaly and hepatic steatosis. Musculoskeletal: No acute abnormality. Osteophytes fuse the entire visualized portion of the cervical and thoracic spine. Old healed left rib fractures. Review of the MIP images confirms the above findings. IMPRESSION: 1. No pulmonary emboli or other acute abnormalities. 2. Chronic hepatomegaly and hepatic steatosis. 3. Emphysema and aortic atherosclerosis. Aortic Atherosclerosis (ICD10-I70.0) and Emphysema (ICD10-J43.9). Electronically Signed   By: Lorriane Shire M.D.   On: 05/11/2020 15:37   MM DIAG BREAST TOMO BILATERAL  Result Date: 05/26/2020 CLINICAL DATA:  64 year old female presenting for evaluation of bilateral diffuse breast pain for 4-5 months and bilateral axillary lumps. EXAM: DIGITAL DIAGNOSTIC BILATERAL MAMMOGRAM WITH CAD AND TOMO ULTRASOUND BILATERAL AXILLA COMPARISON:  Previous exam(s). ACR Breast Density Category b: There are scattered areas of fibroglandular density. FINDINGS: No suspicious calcifications, masses or areas of distortion are seen in the bilateral breasts. Spot compression tomosynthesis images over the palpable areas in the bilateral axillae demonstrate normal subcutaneous tissue. No suspicious masses, or abnormal lymph nodes are identified. Mammographic images were processed with CAD. On physical exam, there are bilateral fat pads of the low axilla, right greater than left. No underlying palpable masses. Ultrasound targeted to the right and left axillae demonstrate normal subcutaneous tissue. No suspicious masses or abnormal lymph nodes are identified. IMPRESSION:  1. No mammographic abnormalities in either breast to explain the patient's nonfocal bilateral breast pain. 2. No mammographic or targeted sonographic abnormalities are seen in the bilateral axilla to correspond with the palpable areas  of concern. 3.  No mammographic evidence of malignancy in the bilateral breasts. RECOMMENDATION: 1. Clinical follow-up recommended for the bilateral breast pain and bilateral axillary fullness. Any further workup should be based on clinical grounds. 2.  Screening mammogram in one year.(Code:SM-B-01Y) I have discussed the findings and recommendations with the patient. If applicable, a reminder letter will be sent to the patient regarding the next appointment. BI-RADS CATEGORY  1: Negative. Electronically Signed   By: Ammie Ferrier M.D.   On: 05/26/2020 15:36   ECHOCARDIOGRAM COMPLETE  Result Date: 06/01/2020    ECHOCARDIOGRAM REPORT   Patient Name:   [facility] Date of Exam: 06/01/2020 Medical Rec #:  [id number]             Height:       60.0 in Accession #:    [id number]            Weight:       212.0 lb Date of Birth:  [birth year]            BSA:          1.913 m Patient Age:    5 years              BP:           110/58 mmHg Patient Gender: F                     HR:           72 bpm. Exam Location:  [place] Procedure: 2D Echo, Cardiac Doppler, Color Doppler and Intracardiac            Opacification Agent Indications:    R06.02  History:        Patient has prior history of Echocardiogram examinations, most                 recent 08/23/2017. CHF, COPD, Signs/Symptoms:Shortness of                 Breath; Risk Factors:Hypertension, Diabetes, Dyslipidemia,                 Morbid obesity and Former Smoker.  Sonographer:    Coralyn Helling RDCS Referring Phys: [id number] TRACI R TURNER  Sonographer Comments: Patient is morbidly obese. Image acquisition challenging due to patient body habitus. IMPRESSIONS  1. Left ventricular ejection fraction, by estimation, is 60 to 65%. The left ventricle has normal function. The left ventricle has no regional wall motion abnormalities. There is mild concentric left ventricular hypertrophy. Left ventricular diastolic parameters were normal.  2. Right ventricular  systolic function is normal. The right ventricular size is normal. Tricuspid regurgitation signal is inadequate for assessing PA pressure.  3. The mitral valve is grossly normal. Trivial mitral valve regurgitation. No evidence of mitral stenosis.  4. The aortic valve is tricuspid. Aortic valve regurgitation is not visualized. No aortic stenosis is present. Comparison(s): No significant change from prior study. FINDINGS  Left Ventricle: Left ventricular ejection fraction, by estimation, is 60 to 65%. The left ventricle has normal function. The left ventricle has no regional wall motion abnormalities. Definity contrast agent was given IV to delineate the left ventricular  endocardial borders. The left ventricular internal cavity size was normal in size. There  is mild concentric left ventricular hypertrophy. Left ventricular diastolic parameters were normal. Normal left ventricular filling pressure. Right Ventricle: The right ventricular size is normal. No increase in right ventricular wall thickness. Right ventricular systolic function is normal. Tricuspid regurgitation signal is inadequate for assessing PA pressure. Left Atrium: Left atrial size was normal in size. Right Atrium: Right atrial size was normal in size. Pericardium: Trivial pericardial effusion is present. Presence of pericardial fat pad. Mitral Valve: The mitral valve is grossly normal. Trivial mitral valve regurgitation. No evidence of mitral valve stenosis. Tricuspid Valve: The tricuspid valve is grossly normal. Tricuspid valve regurgitation is not demonstrated. No evidence of tricuspid stenosis. Aortic Valve: The aortic valve is tricuspid. Aortic valve regurgitation is not visualized. No aortic stenosis is present. Pulmonic Valve: The pulmonic valve was grossly normal. Pulmonic valve regurgitation is trivial. No evidence of pulmonic stenosis. Aorta: The aortic root and ascending aorta are structurally normal, with no evidence of dilitation. Venous:  The inferior vena cava was not well visualized. IAS/Shunts: The atrial septum is grossly normal.  LEFT VENTRICLE PLAX 2D LVIDd:         3.90 cm  Diastology LVIDs:         2.70 cm  LV e' lateral:   7.83 cm/s LV PW:         1.20 cm  LV E/e' lateral: 11.8 LV IVS:        1.20 cm  LV e' medial:    7.40 cm/s LVOT diam:     1.90 cm  LV E/e' medial:  12.5 LV SV:         77 LV SV Index:   40 LVOT Area:     2.84 cm  RIGHT VENTRICLE RV S prime:     11.90 cm/s TAPSE (M-mode): 1.8 cm LEFT ATRIUM           Index       RIGHT ATRIUM           Index LA diam:      3.80 cm 1.99 cm/m  RA Area:     13.60 cm LA Vol (A4C): 31.7 ml 16.57 ml/m RA Volume:   35.80 ml  18.71 ml/m  AORTIC VALVE LVOT Vmax:   132.00 cm/s LVOT Vmean:  86.400 cm/s LVOT VTI:    0.273 m  AORTA Ao Root diam: 3.20 cm Ao Asc diam:  3.00 cm MV E velocity: 92.70 cm/s MV A velocity: 85.20 cm/s  SHUNTS MV E/A ratio:  1.09        Systemic VTI:  0.27 m                            Systemic Diam: 1.90 cm Eleonore Chiquito MD Electronically signed by Eleonore Chiquito MD Signature Date/Time: 06/01/2020/10:34:45 AM    Final    [place] AXILLA LEFT  Result Date: 05/26/2020 CLINICAL DATA:  64 year old female presenting for evaluation of bilateral diffuse breast pain for 4-5 months and bilateral axillary lumps. EXAM: DIGITAL DIAGNOSTIC BILATERAL MAMMOGRAM WITH CAD AND TOMO ULTRASOUND BILATERAL AXILLA COMPARISON:  Previous exam(s). ACR Breast Density Category b: There are scattered areas of fibroglandular density. FINDINGS: No suspicious calcifications, masses or areas of distortion are seen in the bilateral breasts. Spot compression tomosynthesis images over the palpable areas in the bilateral axillae demonstrate normal subcutaneous tissue. No suspicious masses, or abnormal lymph nodes are identified. Mammographic images were processed with CAD. On physical exam, there are bilateral fat  pads of the low axilla, right greater than left. No underlying palpable masses. Ultrasound targeted to  the right and left axillae demonstrate normal subcutaneous tissue. No suspicious masses or abnormal lymph nodes are identified. IMPRESSION: 1. No mammographic abnormalities in either breast to explain the patient's nonfocal bilateral breast pain. 2. No mammographic or targeted sonographic abnormalities are seen in the bilateral axilla to correspond with the palpable areas of concern. 3.  No mammographic evidence of malignancy in the bilateral breasts. RECOMMENDATION: 1. Clinical follow-up recommended for the bilateral breast pain and bilateral axillary fullness. Any further workup should be based on clinical grounds. 2.  Screening mammogram in one year.(Code:[id number]-B-01Y) I have discussed the findings and recommendations with the patient. If applicable, a reminder letter will be sent to the patient regarding the next appointment. BI-RADS CATEGORY  1: Negative. Electronically Signed   By: Ammie Ferrier M.D.   On: 05/26/2020 15:36   [place] AXILLA RIGHT  Result Date: 05/26/2020 CLINICAL DATA:  64 year old female presenting for evaluation of bilateral diffuse breast pain for 4-5 months and bilateral axillary lumps. EXAM: DIGITAL DIAGNOSTIC BILATERAL MAMMOGRAM WITH CAD AND TOMO ULTRASOUND BILATERAL AXILLA COMPARISON:  Previous exam(s). ACR Breast Density Category b: There are scattered areas of fibroglandular density. FINDINGS: No suspicious calcifications, masses or areas of distortion are seen in the bilateral breasts. Spot compression tomosynthesis images over the palpable areas in the bilateral axillae demonstrate normal subcutaneous tissue. No suspicious masses, or abnormal lymph nodes are identified. Mammographic images were processed with CAD. On physical exam, there are bilateral fat pads of the low axilla, right greater than left. No underlying palpable masses. Ultrasound targeted to the right and left axillae demonstrate normal subcutaneous tissue. No suspicious masses or abnormal lymph nodes are identified.  IMPRESSION: 1. No mammographic abnormalities in either breast to explain the patient's nonfocal bilateral breast pain. 2. No mammographic or targeted sonographic abnormalities are seen in the bilateral axilla to correspond with the palpable areas of concern. 3.  No mammographic evidence of malignancy in the bilateral breasts. RECOMMENDATION: 1. Clinical follow-up recommended for the bilateral breast pain and bilateral axillary fullness. Any further workup should be based on clinical grounds. 2.  Screening mammogram in one year.(Code:[id number]-B-01Y) I have discussed the findings and recommendations with the patient. If applicable, a reminder letter will be sent to the patient regarding the next appointment. BI-RADS CATEGORY  1: Negative. Electronically Signed   By: Ammie Ferrier M.D.   On: 05/26/2020 15:36   VAS [place] LOWER EXTREMITY VENOUS (DVT)  Result Date: 06/03/2020  Lower Venous DVTStudy Indications: Edema. Other Indications: Patient complains of left leg swelling for over one year. Risk Factors: None identified. Comparison Study: 09/01/19-No left lower extremity DVT. Performing Technologist: Wilkie Aye RVT  Examination Guidelines: A complete evaluation includes B-mode imaging, spectral Doppler, color Doppler, and power Doppler as needed of all accessible portions of each vessel. Bilateral testing is considered an integral part of a complete examination. Limited examinations for reoccurring indications may be performed as noted. The reflux portion of the exam is performed with the patient in reverse Trendelenburg.  +-----+---------------+---------+-----------+----------+--------------+ RIGHTCompressibilityPhasicitySpontaneityPropertiesThrombus Aging +-----+---------------+---------+-----------+----------+--------------+ CFV  Full           Yes      Yes                                 +-----+---------------+---------+-----------+----------+--------------+    +---------+---------------+---------+-----------+----------+--------------+ LEFT     CompressibilityPhasicitySpontaneityPropertiesThrombus Aging +---------+---------------+---------+-----------+----------+--------------+  CFV      Full           Yes      Yes                                 +---------+---------------+---------+-----------+----------+--------------+ SFJ      Full           Yes      Yes                                 +---------+---------------+---------+-----------+----------+--------------+ FV Prox  Full           Yes      Yes                                 +---------+---------------+---------+-----------+----------+--------------+ FV Mid   Full           Yes      Yes                                 +---------+---------------+---------+-----------+----------+--------------+ FV DistalFull           Yes      Yes                                 +---------+---------------+---------+-----------+----------+--------------+ PFV      Full                                                        +---------+---------------+---------+-----------+----------+--------------+ POP      Full           Yes      Yes                                 +---------+---------------+---------+-----------+----------+--------------+ PTV      Full           Yes      Yes                                 +---------+---------------+---------+-----------+----------+--------------+ PERO     Full           Yes      Yes                                 +---------+---------------+---------+-----------+----------+--------------+ Soleal                           Yes                                 +---------+---------------+---------+-----------+----------+--------------+ Gastroc  Full                                                        +---------+---------------+---------+-----------+----------+--------------+  GSV      Full           Yes      Yes                                  +---------+---------------+---------+-----------+----------+--------------+ SSV                              Yes                                 +---------+---------------+---------+-----------+----------+--------------+   Summary: RIGHT: - No evidence of common femoral vein obstruction.  LEFT: - No evidence of deep vein thrombosis in the lower extremity. No indirect evidence of obstruction proximal to the inguinal ligament. - No cystic structure found in the popliteal fossa.  *See table(s) above for measurements and observations. Electronically signed by Ida Rogue MD on 06/03/2020 at 5:57:55 PM.    Final      Assessment & Plan:   COPD (chronic obstructive pulmonary disease) ([place]) - Increased frequency of COPD exacerbations, total of 4 this year alone. Last treated on 05/17/20 with Doxycycline and prednisone taper - Plan to start Daliresp 250 mcg daily x4 weeks and if tolerates increase to 500 mcg daily  - She will continue Trelegy Ellipta 1 puff daily in the morning.   - Recheck PFTs at next visit in 3 months - Follow-up in 4 weeks telephone visit with APP/ 3 months with DR. BYRUM   Chronic respiratory failure with hypoxia ([place]) - Stable; continues to benefit from supplemental oxygen 2-4L/min    Martyn Ehrich, NP 06/06/2020

## 2020-06-06 NOTE — Patient Instructions (Addendum)
Recommendation - Start medication called Daliresp (Roflumilast)- start 289mcg daily x 4 weeks (then if tolerating will go up to 543mcg daily) - Continue Trelegy one puff daily in the morning (rinse mouth after use)  Orders: - PFTs in 3 months   Follow-up: - 1 Month televisit with Beth NP to see how you are doing on new medication  - Needs follow-up in 3 months with [name] with PFTs     Roflumilast oral tablets What is this medicine? ROFLUMILAST (roe FLUE mi last) decreases inflammation in the lungs. This medicine is used to prevent COPD flare-ups. Do not use this medicine to treat sudden breathing problems. This medicine may be used for other purposes; ask your health care provider or pharmacist if you have questions. COMMON BRAND NAME(S): Daliresp What should I tell my health care provider before I take this medicine? They need to know if you have any of these conditions:  liver disease  mental illness  an unusual or allergic reaction to roflumilast, other medicines, foods, dyes, or preservatives  pregnant or trying to get pregnant  breast-feeding How should I use this medicine? Take this medicine by mouth with a glass of water. Follow the directions on the prescription label. You can take it with or without food. If it upsets your stomach, take it with food. Take your medicine at regular intervals. Do not take it more often than directed. Do not stop taking except on your doctor's advice. A special MedGuide will be given to you by the pharmacist with each prescription and refill. Be sure to read this information carefully each time. Talk to your pediatrician regarding the use of this medicine in children. Special care may be needed. Overdosage: If you think you have taken too much of this medicine contact a poison control center or emergency room at once. NOTE: This medicine is only for you. Do not share this medicine with others. What if I miss a dose? If you miss a dose,  take it as soon as you can. If it is almost time for your next dose, take only that dose. Do not take double or extra doses. What may interact with this medicine?  carbamazepine  cimetidine  enoxacin  erythromycin  fluvoxamine  ketoconazole  phenobarbital  phenytoin  rifampicin  St. John's wort This list may not describe all possible interactions. Give your health care provider a list of all the medicines, herbs, non-prescription drugs, or dietary supplements you use. Also tell them if you smoke, drink alcohol, or use illegal drugs. Some items may interact with your medicine. What should I watch for while using this medicine? Visit your doctor for regular check ups. Tell your doctor or healthcare professional if your symptoms do not start to get better or if they get worse. What side effects may I notice from receiving this medicine? Side effects that you should report to your doctor or health care professional as soon as possible:  allergic reactions like skin rash, itching or hives, swelling of the face, lips, or tongue  anxious  breathing problems  suicidal thoughts or other mood changes  trouble sleeping  weight loss Side effects that usually do not require medical attention (report to your doctor or health care professional if they continue or are bothersome):  back pain  diarrhea  dizziness  general ill feeling or flu-like symptoms  headache  loss of appetite  nausea, vomiting This list may not describe all possible side effects. Call your doctor for medical advice  about side effects. You may report side effects to FDA at 1-800-FDA-1088. Where should I keep my medicine? Keep out of the reach of children. Store at room temperature between 15 and 30 degrees C (59 and 86 degrees F). Throw away any unused medicine after the expiration date. NOTE: This sheet is a summary. It may not cover all possible information. If you have questions about this medicine,  talk to your doctor, pharmacist, or health care provider.  2020 Elsevier/Gold Standard (2015-12-01 09:35:23)

## 2020-06-07 ENCOUNTER — Ambulatory Visit: Payer: Medicaid Other | Admitting: Adult Health

## 2020-06-07 NOTE — Telephone Encounter (Signed)
Pt returning missed call. [phone]

## 2020-06-07 NOTE — Telephone Encounter (Signed)
Attempted to call pt but unable to reach. Left message for her to return call. 

## 2020-06-07 NOTE — Telephone Encounter (Signed)
Called and spoke with pt. Pt stated the Daliresp is requiring a PA. Stated to pt that we would look into this and once we had more info we would call her back with an update.   Called [facility] at [phone] to initiate PA for pt's daliresp. Patient has been on numerous inhalers as well as nebulizer solutions and has also been on multiple rounds of antibiotics and prednisone to help with her frequent COPD exacerbations.  PA for pt's [place] has been approved from 06/07/20-06/02/21.  PA #[id number]  Called pt's pharmacy letting them know the PA was approved and they said that they would process it for pt. Called and spoke with pt letting her know the PA was approved and that she should be able to get her med and she verbalized understanding. Nothing further needed.

## 2020-06-27 ENCOUNTER — Telehealth: Payer: Self-pay | Admitting: Emergency Medicine

## 2020-06-29 NOTE — Telephone Encounter (Signed)
Called spoke with patient  She only has two more puffs of the inhaler Calling [place] [facility] to start PA  Trelegy approved through 06/24/21  Called let patient know she said she would call pharmacy and be able to get medication picked up today.   Nothing further needed at this time.

## 2020-06-30 ENCOUNTER — Telehealth: Payer: Self-pay | Admitting: Emergency Medicine

## 2020-06-30 NOTE — Telephone Encounter (Signed)
Called and spoke with [facility].  Trelegy prescription rejected. Called [facility] [facility].  PA quantity was listed wrong.  Trelegy PA corrected and approved. [place].  Trelegy prescription approved and being filled. Called and spoke with Patient. Patient aware of Trelegy approval and [facility] filling prescription at this time. Nothing further needed.

## 2020-07-08 ENCOUNTER — Telehealth: Payer: Self-pay | Admitting: Primary Care

## 2020-07-08 MED ORDER — ROFLUMILAST 500 MCG PO TABS
500.0000 ug | ORAL_TABLET | Freq: Every day | ORAL | 11 refills | Status: DC
Start: 2020-07-08 — End: 2021-05-22

## 2020-07-08 NOTE — Telephone Encounter (Signed)
Spoke with patient, she has been on the daliresp 250 mcg for 28 days and needs a prescription sent to her pharmacy for 500 mcg.  Advised I would send the script as requested to pharmacy she requested.  Nothing further needed.

## 2020-07-11 ENCOUNTER — Telehealth: Payer: Self-pay | Admitting: Emergency Medicine

## 2020-07-11 ENCOUNTER — Other Ambulatory Visit: Payer: Self-pay | Admitting: Adult Health

## 2020-07-12 NOTE — Telephone Encounter (Deleted)
PA confirmation # [id number] Call reference # [id number]

## 2020-07-12 NOTE — Telephone Encounter (Addendum)
Medication name and strength: Daliresp 554mcg Provider: Dr Lamonte Sakai Pharmacy: [facility] Patient insurance ID: [id number] M Phone: [phone] Fax: [phone]  Was the PA started on CMM?  No/ [place] [facility] If yes, please enter the Key: n/a Timeframe for approval/denial: 24 hours  PA confirmation # [id number]

## 2020-07-13 NOTE — Telephone Encounter (Signed)
Called [facility] [facility] to check on status of PA. Spoke with Darlene who stated that the PA was approved. PA# [id number]. PA is approved from 07/13/20-07/08/21     Called and spoke with pt letting her know that the PA has been approved and she verbalized understanding.  Nothing further needed.

## 2020-07-13 NOTE — Telephone Encounter (Signed)
Pt is following up regarding PA on Daliresp.  Pt is completely out, ran out on Friday.  Please advise.  [phone]

## 2020-08-05 ENCOUNTER — Ambulatory Visit: Payer: Medicaid Other | Admitting: Primary Care

## 2020-08-05 NOTE — Progress Notes (Deleted)
@Patient  ID: Stephanie Schultz, female    DOB: 1955-12-30, 64 y.o.   MRN: [id number]  No chief complaint on file.   Referring provider: [name], MD  HPI: 64 year old female, former smoker quit in 2016 (108-pack-year history).  Significant for COPD component, chronic cough, chronic respiratory failure O2 dependent, pulmonary nodule, allergic rhinitis, chronic diastolic heart failure, hypertension, GERD, type 2 diabetes, obesity.  Patient of [name]. Maintained on Trelegy, 3-4L/min continuous oxygen.  Previous [facility] pulmonary encounters: 05/17/2020 Patient contacted today for acute telephone visit.  Patient has had several COPD flares recently. She reports increased chest tightness. She has been coughing up thick mucus with occasional yellow/green coloring.  She was previously taking Mucinex but states that this dries out the back of her throat.  She had a CTA with Dr. Radford Pax on June 30 that was negative for PE or acute process. It did show emphysema.  She is compliant with Trelegy Ellipta inhaler.  She has had no increased oxygen demand, continues to wear 3L/min at rest and 4L/min on exertoin.   06/06/2020 Patient presents today for 2 to 3-week follow-up for COPD exacerbation. She is feeling a good deal better but still has some residual shortness of breath. Continues to use Trelegy Ellipta as prescribed once daily. Using 2-3L oxygen, states that she needs more oxygen tanks. She has been taking mucinex which has been helping her get up mucus. CTA in June was negative for PE but did show chronic emphysematous changes in upper lobes. She has gained 5 lbs. BNP was normal in June. Echocardiogram on 06/01/20 showed normal heart function. She has some mild leg swelling. She had an ultrasound of her lower extremities which was negative for  DVT.   08/05/2020- Interim hx Patient presents today for 2 month follow-up.      Recent COPD exacerbations: January 19th, 2021-doxycycline,  prednisone taper Mar 15, 2020- Z-Pak April 18, 2020- Z-Pak, prednisone taper May 17, 2020- Doxycycline, prednisone taper   TEST/EVENTS :  Cardiac: > 08/23/2017-echocardiogram-LV ejection fraction 60 to 65% > 06/01/20- echocardiogram- LV ejection fraction 60-65%, mild concentric left ventricular hypertrophy, normal diastolic parameters   Pulmonary function testing: > 11/16/2013 PFTs-FVC 1.58 (67% predicted), postbronchodilator ratio 60, postbronchodilator FEV1 1.00 (53% predicted), no significant bronchodilator response, mid flow reversibility after administration of bronchodilator, DLCO 39 > 01/12/19 PFTs-FVC 1.18 (52%), FEV1 0.66 (37%), ratio 56, borderline bronchodilator response, DLCO correlated 6.07 (34%)  Imaging: > October 2020 CT chest -Negfor PE,+emphysema with no acute process. > 05/11/20 CTA- No PE, Chronic emphysematous changes in the upper lobes. Chronic linear scarring at the lung bases, left more than right. No effusions or acute infiltrates.  Allergies  Allergen Reactions  . Acetaminophen Other (See Comments)    Pt was told that she could not take Tylenol, does not know why.    Immunization History  Administered Date(s) Administered  . Influenza Split 09/20/2011, 09/12/2012, 08/12/2013  . Influenza,inj,Quad PF,6+ Mos 08/03/2014, 08/12/2015, 08/12/2017, 07/29/2018  . Influenza,inj,Quad PF,6-35 Mos 09/11/2019  . PFIZER SARS-COV-2 Vaccination 02/05/2020, 02/25/2020  . Pneumococcal Conjugate-13 09/24/2017  . Pneumococcal Polysaccharide-23 11/12/2009    Past Medical History:  Diagnosis Date  . Anemia    chronis  . Anxiety   . Arthritis   . Cervical cancer ([place]) 1990   cervical   . Chronic diastolic CHF (congestive heart failure) ([place]) 08/10/2016  . COPD (chronic obstructive pulmonary disease) ([place])    O2 dependent. Pulmo: [name]  . Diabetes mellitus without complication ([place])  a. A1c 8.3 in 11/2015  . Emphysema   . Gallstones    s/p cholecystectomy  .  GERD (gastroesophageal reflux disease)   . Gout   . History of home oxygen therapy    "2.5L; 24/7" (08/10/2016)  . Hx of cardiac catheterization    a. LHC at [facility] in [place] [place] 09/2008:  Normal coronary arteries EF 70%. b. LHC 11/2016: normal cors, normal LVEDP, EF 55-65%.  . Hyperlipidemia   . Hypertension   . Hypoxia 11/17/2018  . Morbid obesity ([place])   . OSA on CPAP    CPAP at night   . Pneumonia   . Sickle cell trait ([place])   . Tobacco abuse    a. up to 3ppd from age 76 to 24, now 1/4 ppd (01/2013) >> Quit 10/2015    Tobacco History: Social History   Tobacco Use  Smoking Status Former Smoker  . Packs/day: 3.00  . Years: 36.00  . Pack years: 108.00  . Types: Cigarettes  . Quit date: 11/11/2015  . Years since quitting: 4.7  Smokeless Tobacco Never Used  Tobacco Comment   Approx 90 pk-yrs (up to 3ppd until ~ 2009). Smoking 3 cigs per day now.   Counseling given: Not Answered Comment: Approx 90 pk-yrs (up to 3ppd until ~ 2009). Smoking 3 cigs per day now.   Outpatient Medications Prior to Visit  Medication Sig Dispense Refill  . albuterol (PROVENTIL) (2.5 MG/3ML) 0.083% nebulizer solution USE 1 VIAL VIA NEBULIZER 4 TIMES DAILY AS NEEDED. 300 mL 0  . albuterol (VENTOLIN HFA) 108 (90 Base) MCG/ACT inhaler Inhale 2 puffs into the lungs every 4 (four) hours as needed for wheezing or shortness of breath.    . allopurinol (ZYLOPRIM) 100 MG tablet Take 100 mg by mouth daily.    Marland Kitchen ALPRAZolam (XANAX) 1 MG tablet Take 1 mg by mouth at bedtime as needed for sleep.   0  . aspirin EC 81 MG tablet Take 81 mg by mouth daily.    Marland Kitchen azithromycin (ZITHROMAX) 250 MG tablet Take 2 today and then one daily until finished 6 tablet 0  . cetirizine (ZYRTEC) 10 MG tablet Take 10 mg by mouth daily.    . Cholecalciferol (VITAMIN D PO) Take 1 tablet by mouth daily.    . clobetasol cream (TEMOVATE) 5.36 % Apply 1 application topically daily as needed (irritation from pull ups).   0  . doxycycline  (VIBRA-TABS) 100 MG tablet Take 1 tablet (100 mg total) by mouth 2 (two) times daily. 14 tablet 0  . esomeprazole (NEXIUM) 40 MG capsule Take 40 mg by mouth daily at 12 noon.    . fluticasone (FLONASE) 50 MCG/ACT nasal spray Place 1 spray into both nostrils daily as needed (congestion).    . Fluticasone-Umeclidin-Vilant (TRELEGY ELLIPTA) 100-62.5-25 MCG/INH AEPB Take 1 puff by mouth daily. 60 each 5  . furosemide (LASIX) 40 MG tablet TAKE 1 TABLET IN THE AM AND 1/2 TABLET IN PM. 135 tablet 2  . glipiZIDE (GLUCOTROL) 10 MG tablet Take 10 mg by mouth 2 (two) times daily before a meal.     . HYDROcodone-acetaminophen (NORCO) 7.5-325 MG tablet Take 1 tablet by mouth every 6 (six) hours as needed for moderate pain.    Marland Kitchen insulin glargine (LANTUS) 100 unit/mL SOPN Inject 18 Units into the skin at bedtime.     . insulin lispro (HUMALOG) 100 UNIT/ML injection Inject 6-10 Units into the skin once. 6units for sugar over 200 10 units for sugar  over 300    . Linaclotide (LINZESS) 145 MCG CAPS capsule Take 145 mcg by mouth daily.     . meclizine (ANTIVERT) 25 MG tablet Take 25 mg by mouth every 6 (six) hours as needed.    . Melatonin 2.5 MG CHEW Chew 2.5 mg by mouth See admin instructions. Only takes when waking up in middle of the night    . metoprolol succinate (TOPROL-XL) 50 MG 24 hr tablet TAKE 1 TABLET DAILY. TAKE WITH A MEAL OR IMMEDIATELY FOLLOWING A MEAL. 30 tablet 5  . mupirocin ointment (BACTROBAN) 2 % mupirocin 2 % topical ointment  apply to NOSE twice a day    . ondansetron (ZOFRAN ODT) 4 MG disintegrating tablet Take 1 tablet (4 mg total) by mouth every 8 (eight) hours as needed for nausea or vomiting. 20 tablet 0  . OXYGEN Inhale 2.5 L into the lungs continuous.     Marland Kitchen PAZEO 0.7 % SOLN Place 1 drop into both eyes at bedtime.    . Potassium Chloride ER 20 MEQ TBCR Take 20 mEq by mouth daily. 90 tablet 3  . predniSONE (DELTASONE) 10 MG tablet Take 40mg  for 3 days, then 30mg  for 3 days, 20mg  for 3  days, 10mg  for 3 days, then stop 30 tablet 0  . predniSONE (DELTASONE) 10 MG tablet Take 4 tabs po daily x 3 days; then 3 tabs daily x3 days; then 2 tabs daily x3 days; then 1 tab daily x 3 days; then stop 30 tablet 0  . Respiratory Therapy Supplies (FLUTTER) DEVI 1 Device by Does not apply route 3 (three) times daily. 1 each 0  . roflumilast (DALIRESP) 500 MCG TABS tablet Take 1 tablet (500 mcg total) by mouth daily. 30 tablet 11  . sitaGLIPtin (JANUVIA) 100 MG tablet Take 100 mg by mouth daily.    Marland Kitchen spironolactone (ALDACTONE) 25 MG tablet Take 0.5 tablets (12.5 mg total) by mouth daily. 15 tablet 12  . zolpidem (AMBIEN) 10 MG tablet Take 10 mg by mouth at bedtime.     No facility-administered medications prior to visit.      Review of Systems  Review of Systems   Physical Exam  LMP 06/04/2000 (LMP Unknown) Comment: tubal ligation Physical Exam   Lab Results:  CBC    Component Value Date/Time   WBC 5.7 05/10/2020 1004   WBC 6.2 08/31/2019 1800   RBC 4.30 05/10/2020 1004   RBC 4.07 08/31/2019 1800   HGB 12.3 05/10/2020 1004   HCT 36.9 05/10/2020 1004   PLT 193 05/10/2020 1004   MCV 86 05/10/2020 1004   MCH 28.6 05/10/2020 1004   MCH 28.3 08/31/2019 1800   MCHC 33.3 05/10/2020 1004   MCHC 33.1 08/31/2019 1800   RDW 15.6 (H) 05/10/2020 1004   LYMPHSABS 1.8 08/31/2019 1800   MONOABS 0.6 08/31/2019 1800   EOSABS 0.3 08/31/2019 1800   BASOSABS 0.0 08/31/2019 1800    BMET    Component Value Date/Time   NA 143 05/10/2020 1004   K 4.2 05/10/2020 1004   CL 100 05/10/2020 1004   CO2 34 (H) 05/10/2020 1004   GLUCOSE 162 (H) 05/10/2020 1004   GLUCOSE 127 (H) 08/31/2019 1800   BUN 14 05/10/2020 1004   CREATININE 1.02 (H) 05/10/2020 1004   CALCIUM 9.9 05/10/2020 1004   GFRNONAA 60 05/10/2020 1004   GFRAA 69 05/10/2020 1004    BNP    Component Value Date/Time   BNP 11.3 08/31/2019 1800    ProBNP  Component Value Date/Time   PROBNP 10 05/10/2020 1004    PROBNP 16.1 07/05/2014 1230    Imaging: No results found.   Assessment & Plan:   No problem-specific Assessment & Plan notes found for this encounter.     Martyn Ehrich, NP 08/05/2020

## 2020-08-15 ENCOUNTER — Other Ambulatory Visit: Payer: Self-pay | Admitting: Adult Health

## 2020-08-24 ENCOUNTER — Ambulatory Visit: Payer: Medicaid Other | Admitting: Emergency Medicine

## 2020-08-24 ENCOUNTER — Ambulatory Visit: Payer: Medicaid Other | Admitting: *Deleted

## 2020-08-24 ENCOUNTER — Encounter: Payer: Self-pay | Admitting: Emergency Medicine

## 2020-08-24 ENCOUNTER — Other Ambulatory Visit: Payer: Self-pay

## 2020-08-24 DIAGNOSIS — J309 Allergic rhinitis, unspecified: Secondary | ICD-10-CM

## 2020-08-24 DIAGNOSIS — J441 Chronic obstructive pulmonary disease with (acute) exacerbation: Secondary | ICD-10-CM

## 2020-08-24 DIAGNOSIS — K219 Gastro-esophageal reflux disease without esophagitis: Secondary | ICD-10-CM

## 2020-08-24 DIAGNOSIS — J9611 Chronic respiratory failure with hypoxia: Secondary | ICD-10-CM

## 2020-08-24 DIAGNOSIS — J449 Chronic obstructive pulmonary disease, unspecified: Secondary | ICD-10-CM

## 2020-08-24 LAB — PULMONARY FUNCTION TEST
DL/VA % pred: 62 %
DL/VA: 2.71 ml/min/mmHg/L
DLCO cor % pred: 31 %
DLCO cor: 5.54 ml/min/mmHg
DLCO unc % pred: 31 %
DLCO unc: 5.54 ml/min/mmHg
FEF 25-75 Post: 0.31 L/sec
FEF 25-75 Pre: 0.27 L/sec
FEF2575-%Change-Post: 14 %
FEF2575-%Pred-Post: 17 %
FEF2575-%Pred-Pre: 15 %
FEV1-%Change-Post: 9 %
FEV1-%Pred-Post: 40 %
FEV1-%Pred-Pre: 37 %
FEV1-Post: 0.7 L
FEV1-Pre: 0.64 L
FEV1FVC-%Change-Post: 4 %
FEV1FVC-%Pred-Pre: 63 %
FEV6-%Change-Post: 4 %
FEV6-%Pred-Post: 61 %
FEV6-%Pred-Pre: 58 %
FEV6-Post: 1.29 L
FEV6-Pre: 1.23 L
FEV6FVC-%Change-Post: 0 %
FEV6FVC-%Pred-Post: 101 %
FEV6FVC-%Pred-Pre: 101 %
FVC-%Change-Post: 4 %
FVC-%Pred-Post: 60 %
FVC-%Pred-Pre: 57 %
FVC-Post: 1.32 L
FVC-Pre: 1.27 L
Post FEV1/FVC ratio: 53 %
Post FEV6/FVC ratio: 98 %
Pre FEV1/FVC ratio: 50 %
Pre FEV6/FVC Ratio: 97 %
RV % pred: 117 %
RV: 2.13 L
TLC % pred: 77 %
TLC: 3.44 L

## 2020-08-24 NOTE — Assessment & Plan Note (Signed)
Continue Flonase, Zyrtec as you have been taking them

## 2020-08-24 NOTE — Patient Instructions (Addendum)
Continue Trelegy once daily as you have been taking it.  Rinse and gargle after using. Keep your albuterol available to use either 2 puffs or 1 nebulizer treatment up to every 4 hours if needed for shortness of breath, chest tightness, wheezing. Continue Daliresp as you have been taking it Continue your oxygen at 2 to 3 L/min at all times.  We will work on getting you 6 more E-tanks from your medical company Continue Flonase, Zyrtec and esomeprazole as you have been taking them COVID-19 vaccine and flu vaccine are both up-to-date You need the pneumonia shot after age 73 Follow with Stephanie Schultz in 6 months or sooner if you have any problems

## 2020-08-24 NOTE — Progress Notes (Signed)
Subjective:    Patient ID: Arantza Darrington, female    DOB: 29-Nov-1955, 64 y.o.   MRN: [id number]    ROV 07/29/18 --Mrs. Mcdevitt is 34 with a history of obesity, COPD with an asthmatic component, upper airway irritation syndrome with chronic cough, hypertension with diastolic dysfunction.  She is been treated in the past with for sleep apnea but her most recent PSG showed only nocturnal hypoxemia without overt obstructive apneas.  At her last visit I changed her Symbicort to Trelegy to see if she would get benefit.  She was unfortunately seen in the emergency department 9/6 with more nasal congestion, sore throat, thick sputum and increased albuterol use.  She was treated with 5 days of prednisone, doxycycline. She is feeling better now. She believes that the Trelegy has been beneficial, her breathing and exertional tolerance is better. She is using 2-3L/min o2. She uses albuterol about 4-5x a day, appears to be using on a schedule.   Acute 10/14/18 --Ms. Luhrs is 13, obese with a history of COPD with an asthmatic component and significant upper airway irritation syndrome associated with chronic cough.  She has hypertension with diastolic dysfunction.  She was formally treated for sleep apnea but her most recent PSG shows only nocturnal hypoxemia without any overt obstruction.  She is now managed on Trelegy.  She presents today for an acute visit complaining of 3 weeks of initially URI sx, then evolved some slight cough with thick yellow mucous. Progressed to exertional SOB. She tried temporarily increasing her diuretics rto see if it would help - no better. She is having SOB when walking through the house. She is using albuterol more frequently.  She remains loratadine, flonase.   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  Also with nocturnal hypoxemia in the absence of OSA.  She is on 2 to 3 L/min by nasal cannula.  Hospitalized  beginning of January for an acute flare, ? Superimposed dCHF decompensation.  She is been seen twice in our office since including on 2/6 when she was noticing exertional desaturations. She just finished PT, feels that it helped her a lot. Her lasix has also been increased - has helped her breathing; then she decreased it because she is worried about her renal fxn.  She had PFT today that I reviewed, shows very severe obstruction, FEV1 33% predicted (down from 50% in 2015). She is Trelegy, tolerates well, gets good benefit. She uses albuterol about  She was treated with azithro for a possible sinusitis. She is on loratadine, fluticasone NS, nexium.   ROV 08/24/20 --follow-up visit for 64 year old woman with very severe COPD with asthmatic features.  Also with superimposed upper airway irritation syndrome and chronic cough.  Followed for hypertension and diastolic dysfunction, obesity.  She has chronic hypoxemia on 2 to 3 L/min with documented nocturnal hypoxemia, no OSA. She reports today that she is doing pretty well. She is on daliresp for the last 2 months, on Trelegy. Has albuterol that she uses . She is on flonase, zyrtec. On esomeprazole. Her last flare was over 6 months ago. COVID vaccine up to date, Flu up to date. Needs PNA shot after age 15 PFT done today reviewed by me show very severe obstruction. Restricted volumes, decreased diffusion capacity.   She needs 6 more green e-tanks for her O2 from [employer].    ROS  See history of present illness   Objective:   Physical Exam Vitals:  08/24/20 1023  BP: 138/72  Pulse: 74  Temp: 97.9 F (36.6 C)  TempSrc: Temporal  SpO2: 99%  Weight: 198 lb 6.4 oz (90 kg)  Height: 5' (1.524 m)    Gen: Obese, in no distress,  normal affect  ENT: No lesions,  mouth clear,  oropharynx clear, no postnasal drip, strong voice  Neck: No JVD, no stridor  Lungs: No use of accessory muscles, distant, prolonged exp, no wheeze  Cardiovascular: RRR, heart  sounds normal, no murmur or gallops, trace LE peripheral edema  Musculoskeletal: No deformities, no cyanosis or clubbing  Neuro: alert, non focal  Skin: Warm, no lesions or rashes, old scars on B UE's   Assessment & Plan:   COPD (chronic obstructive pulmonary disease) (HCC) Continue Trelegy once daily as you have been taking it.  Rinse and gargle after using. Keep your albuterol available to use either 2 puffs or 1 nebulizer treatment up to every 4 hours if needed for shortness of breath, chest tightness, wheezing. Continue Daliresp as you have been taking it COVID-19 vaccine and flu vaccine are both up-to-date You need the pneumonia shot after age 4 Follow with Dr Lamonte Sakai in 6 months or sooner if you have any problems  Chronic respiratory failure with hypoxia ([place]) Continue your oxygen at 2 to 3 L/min at all times.  We will work on getting you 6 more E-tanks from your medical company  Chronic allergic rhinitis Continue Flonase, Zyrtec as you have been taking them  GERD (gastroesophageal reflux disease) Continue esomeprazole  [name], MD, PhD 08/24/2020, 11:01 AM [facility] [phone] or if no answer [phone]

## 2020-08-24 NOTE — Assessment & Plan Note (Signed)
Continue your oxygen at 2 to 3 L/min at all times.  We will work on getting you 6 more E-tanks from your medical company

## 2020-08-24 NOTE — Assessment & Plan Note (Signed)
Continue esomeprazole. 

## 2020-08-24 NOTE — Addendum Note (Signed)
Addended by: Gavin Potters R on: 08/24/2020 11:07 AM   Modules accepted: Orders

## 2020-08-24 NOTE — Assessment & Plan Note (Signed)
Continue Trelegy once daily as you have been taking it.  Rinse and gargle after using. Keep your albuterol available to use either 2 puffs or 1 nebulizer treatment up to every 4 hours if needed for shortness of breath, chest tightness, wheezing. Continue Daliresp as you have been taking it COVID-19 vaccine and flu vaccine are both up-to-date You need the pneumonia shot after age 64 Follow with Dr Lamonte Sakai in 6 months or sooner if you have any problems

## 2020-08-31 LAB — PULMONARY FUNCTION TEST
DL/VA % pred: 62 %
DL/VA: 2.71 ml/min/mmHg/L
DLCO cor % pred: 31 %
DLCO cor: 5.54 ml/min/mmHg
DLCO unc % pred: 31 %
DLCO unc: 5.54 ml/min/mmHg
FEF 25-75 Post: 0.31 L/sec
FEF 25-75 Pre: 0.27 L/sec
FEF2575-%Change-Post: 14 %
FEF2575-%Pred-Post: 17 %
FEF2575-%Pred-Pre: 15 %
FEV1-%Change-Post: 9 %
FEV1-%Pred-Post: 40 %
FEV1-%Pred-Pre: 37 %
FEV1-Post: 0.7 L
FEV1-Pre: 0.64 L
FEV1FVC-%Change-Post: 4 %
FEV1FVC-%Pred-Pre: 63 %
FEV6-%Change-Post: 4 %
FEV6-%Pred-Post: 61 %
FEV6-%Pred-Pre: 58 %
FEV6-Post: 1.29 L
FEV6-Pre: 1.23 L
FEV6FVC-%Change-Post: 0 %
FEV6FVC-%Pred-Post: 101 %
FEV6FVC-%Pred-Pre: 101 %
FVC-%Change-Post: 4 %
FVC-%Pred-Post: 60 %
FVC-%Pred-Pre: 57 %
FVC-Post: 1.32 L
FVC-Pre: 1.27 L
Post FEV1/FVC ratio: 53 %
Post FEV6/FVC ratio: 98 %
Pre FEV1/FVC ratio: 50 %
Pre FEV6/FVC Ratio: 97 %
RV % pred: 117 %
RV: 2.13 L
TLC % pred: 77 %
TLC: 3.44 L

## 2020-10-05 ENCOUNTER — Other Ambulatory Visit: Payer: Self-pay | Admitting: Cardiology

## 2020-10-11 ENCOUNTER — Telehealth: Payer: Self-pay | Admitting: Emergency Medicine

## 2020-10-11 MED ORDER — DOXYCYCLINE HYCLATE 100 MG PO TABS
100.0000 mg | ORAL_TABLET | Freq: Two times a day (BID) | ORAL | 0 refills | Status: DC
Start: 2020-10-11 — End: 2021-03-14

## 2020-10-11 NOTE — Telephone Encounter (Signed)
Spoke with the pt and notified of response per Dr Lamonte Sakai  She verbalized understanding  Rx for Doxy has been sent to [facility]

## 2020-10-11 NOTE — Telephone Encounter (Signed)
Ok to use doxycycline 100mg  bid x 7 days

## 2020-10-11 NOTE — Telephone Encounter (Signed)
Call and spoke to pt. Pt c/o frontal headache with sinus pressure (frontal and right maxillary), sneezing, itchiness on nose, sinus congestion, slight increase in SOB, intermittent chest tightness x 6-7 days. Pt denies f/c/s. Pt has not been around anyone with covid that she is aware of. Pt has been fully vaccinated. Not taking OTC meds. Pt last seen on 10.13.21 and advised to follow up in 6 months.   [name] please advise. Thanks.

## 2020-10-14 ENCOUNTER — Telehealth: Payer: Self-pay | Admitting: Emergency Medicine

## 2020-10-18 NOTE — Telephone Encounter (Signed)
Nothing noted in message. Will close encounter as error.  

## 2020-10-31 ENCOUNTER — Other Ambulatory Visit: Payer: Self-pay | Admitting: Cardiology

## 2020-11-09 ENCOUNTER — Telehealth: Payer: Self-pay | Admitting: Emergency Medicine

## 2020-11-09 NOTE — Telephone Encounter (Signed)
Please reach out to [place] . Call back # [phone]

## 2020-11-09 NOTE — Telephone Encounter (Signed)
Attempted to call [facility] with [employer]. Was on hold for 10 mins. Will attempt tomorrow

## 2020-11-14 NOTE — Telephone Encounter (Signed)
Called callback number listed for Barbara Cower, received another customer service rep and was advised that she wasn't allowed to transfer calls, so she could help me or I could try back if I didn't have an extension.. Rep states that they are needing our office to fax the last 12 months of office visit notes to [phone] for patient to receive a reauthorization of their supplemental O2.    This did not make sense to me, so I called Melissa.  Melissa states that we do not need to fax so many office notes, and that she only saw that a CMN was needed.  Melissa will look further into this and call us back to verify what is needed.  Will await call back.

## 2020-11-28 ENCOUNTER — Other Ambulatory Visit: Payer: Self-pay | Admitting: Emergency Medicine

## 2020-11-28 ENCOUNTER — Other Ambulatory Visit: Payer: Self-pay | Admitting: Cardiology

## 2020-11-28 ENCOUNTER — Other Ambulatory Visit: Payer: Self-pay | Admitting: Adult Health

## 2020-12-05 ENCOUNTER — Other Ambulatory Visit: Payer: Self-pay

## 2020-12-05 MED ORDER — POTASSIUM CHLORIDE ER 20 MEQ PO TBCR: 20.0000 meq | EXTENDED_RELEASE_TABLET | Freq: Every day | ORAL | 1 refills | Status: DC

## 2020-12-19 ENCOUNTER — Ambulatory Visit: Payer: Medicaid Other | Admitting: Podiatry

## 2020-12-23 ENCOUNTER — Ambulatory Visit (INDEPENDENT_AMBULATORY_CARE_PROVIDER_SITE_OTHER): Payer: Medicaid Other

## 2020-12-23 ENCOUNTER — Encounter: Payer: Self-pay | Admitting: Podiatry

## 2020-12-23 ENCOUNTER — Ambulatory Visit (INDEPENDENT_AMBULATORY_CARE_PROVIDER_SITE_OTHER): Payer: Medicaid Other | Admitting: Podiatry

## 2020-12-23 ENCOUNTER — Other Ambulatory Visit: Payer: Self-pay

## 2020-12-23 DIAGNOSIS — M79672 Pain in left foot: Secondary | ICD-10-CM

## 2020-12-23 DIAGNOSIS — M216X2 Other acquired deformities of left foot: Secondary | ICD-10-CM | POA: Diagnosis not present

## 2020-12-23 DIAGNOSIS — R600 Localized edema: Secondary | ICD-10-CM | POA: Diagnosis not present

## 2020-12-23 DIAGNOSIS — M7752 Other enthesopathy of left foot: Secondary | ICD-10-CM

## 2020-12-23 DIAGNOSIS — M779 Enthesopathy, unspecified: Secondary | ICD-10-CM

## 2020-12-23 DIAGNOSIS — M216X1 Other acquired deformities of right foot: Secondary | ICD-10-CM | POA: Diagnosis not present

## 2020-12-23 DIAGNOSIS — M79671 Pain in right foot: Secondary | ICD-10-CM

## 2020-12-23 MED ORDER — TRIAMCINOLONE ACETONIDE 10 MG/ML IJ SUSP
10.0000 mg | Freq: Once | INTRAMUSCULAR | Status: AC
  Administered 2020-12-23: 10 mg

## 2020-12-26 ENCOUNTER — Other Ambulatory Visit: Payer: Self-pay | Admitting: Cardiology

## 2021-01-16 NOTE — Progress Notes (Signed)
Subjective:   Patient ID: Stephanie Schultz, female   DOB: 65 y.o.   MRN: [id number]   HPI Patient presents stating she is had a lot of pain on top of her left foot and also get some swelling on her foot and wanted to have that checked.  States that she has 2 separate problems and states the swelling has been more consistent and the pain has been also consistent   ROS      Objective:  Physical Exam  Neurovascular status was found to be intact with negative Bevelyn Buckles' sign noted.  Patient has discomfort in the dorsum of the left foot with inflammation and tendon irritation and is noted to have swelling that occurs in her ankle and lateral foot.  Patient has good digital perfusion well oriented      Assessment:  Inflammatory tendinitis bilateral with left being worse along with an edematous process occurring in the left ankle with negative Homans' sign H&P     Plan:  H&P reviewed conditions and at this point did sterile prep and injected the tendon complex left 3 mg Dexasone Kenalog 5 mg Xylocaine.  Discussed swelling of her ankle and recommended compression elevation immobilization and will be seen back as symptoms indicate

## 2021-01-18 ENCOUNTER — Other Ambulatory Visit: Payer: Self-pay | Admitting: Cardiology

## 2021-02-03 ENCOUNTER — Telehealth: Payer: Self-pay | Admitting: Podiatry

## 2021-02-03 NOTE — Telephone Encounter (Signed)
Pt let voice message on nurses line. She didn't specify what she needed. I tried to call her back but she didn't answer.

## 2021-03-07 NOTE — Progress Notes (Signed)
This encounter was created in error - please disregard.

## 2021-03-08 ENCOUNTER — Encounter: Payer: Medicaid Other | Admitting: Cardiology

## 2021-03-08 DIAGNOSIS — R600 Localized edema: Secondary | ICD-10-CM

## 2021-03-08 DIAGNOSIS — E1122 Type 2 diabetes mellitus with diabetic chronic kidney disease: Secondary | ICD-10-CM

## 2021-03-08 DIAGNOSIS — I10 Essential (primary) hypertension: Secondary | ICD-10-CM

## 2021-03-08 DIAGNOSIS — I5032 Chronic diastolic (congestive) heart failure: Secondary | ICD-10-CM

## 2021-03-08 DIAGNOSIS — R0602 Shortness of breath: Secondary | ICD-10-CM

## 2021-03-14 ENCOUNTER — Other Ambulatory Visit: Payer: Self-pay

## 2021-03-14 ENCOUNTER — Encounter: Payer: Self-pay | Admitting: Primary Care

## 2021-03-14 ENCOUNTER — Ambulatory Visit (INDEPENDENT_AMBULATORY_CARE_PROVIDER_SITE_OTHER): Payer: Medicaid Other | Admitting: Primary Care

## 2021-03-14 DIAGNOSIS — J441 Chronic obstructive pulmonary disease with (acute) exacerbation: Secondary | ICD-10-CM

## 2021-03-14 DIAGNOSIS — J9611 Chronic respiratory failure with hypoxia: Secondary | ICD-10-CM | POA: Diagnosis not present

## 2021-03-14 MED ORDER — TRELEGY ELLIPTA 200-62.5-25 MCG/INH IN AEPB: 1.0000 | INHALATION_SPRAY | Freq: Every day | RESPIRATORY_TRACT | 0 refills | Status: DC

## 2021-03-14 MED ORDER — BENZONATATE 100 MG PO CAPS: 200.0000 mg | ORAL_CAPSULE | Freq: Three times a day (TID) | ORAL | 1 refills | Status: DC | PRN

## 2021-03-14 NOTE — Assessment & Plan Note (Deleted)
-   She is doing quite well overall. Increased sob and cough over the last 2 weeks likely exacerbated by seasonal allergies. She is compliant with Trelegy and Daliresp. She previously was experiencing COPD exacerbations 4-5 times a year prior to starting Daliresp. She has lost 30 lbs by exercising and watching her diet. No GI symptoms. She would like to avoid oral prednisone use if possible. Recommend Trelegy 237mcg sample x 2 weeks for flare-up of her COPD symptoms then resume Trelegy 174mcg. Sending in Tesslon perles 200mg  TID for cough and recommend she continue to use Flonase nasal spray.

## 2021-03-14 NOTE — Progress Notes (Signed)
@Patient  ID: Stephanie Schultz, female    DOB: [birth year], 65 y.o.   MRN: [id number]  Chief Complaint  Patient presents with  . Follow-up    Pt states that her breathing has been worse for the last 2 weeks and also states she has had problems with a bad cough.    Referring provider: [name], MD  HPI:  65 year old female, former smoker quit in 2016 (108-pack-year history).  Significant for COPD component, chronic cough, chronic respiratory failure O2 dependent, pulmonary nodule, allergic rhinitis, chronic diastolic heart failure, hypertension, GERD, type 2 diabetes, obesity.  Patient of [name], pulmonary nurse practitioner on 04/28/2020 for virtual telephone visit.  During this visit she was feeling better after being treated for an exacerbation on June 7th 2021 with Z-Pak and prednisone.  Maintained on Trelegy, 3-4L/min continuous oxygen.  Previous [facility] pulmonary encounters: 05/17/2020 Patient contacted today for acute telephone visit.  Patient has had several COPD flares recently. She reports increased chest tightness. She has been coughing up thick mucus with occasional yellow/green coloring.  She was previously taking Mucinex but states that this dries out the back of her throat.  She had a CTA with Dr. Radford Pax on June 30 that was negative for PE or acute process. It did show emphysema.  She is compliant with Trelegy Ellipta inhaler.  She has had no increased oxygen demand, continues to wear 3L/min at rest and 4L/min on exertoin.   06/06/2020 Patient presents today for 2 to 3-week follow-up for COPD exacerbation. She is feeling a good deal better but still has some residual shortness of breath. Continues to use Trelegy Ellipta as prescribed once daily. Using 2-3L oxygen, states that she needs more oxygen tanks. She has been taking mucinex which has been helping her get up mucus. CTA in June was negative for PE but did show chronic emphysematous changes in upper lobes. She has  gained 5 lbs. BNP was normal in June. Echocardiogram on 06/01/20 showed normal heart function. She has some mild leg swelling. She had an ultrasound of her lower extremities which was negative for  DVT.   08/24/20- ROV, [name]  Follow-up visit for 65 year old woman with very severe COPD with asthmatic features.  Also with superimposed upper airway irritation syndrome and chronic cough.  Followed for hypertension and diastolic dysfunction, obesity.  She has chronic hypoxemia on 2 to 3 L/min with documented nocturnal hypoxemia, no OSA. She reports today that she is doing pretty well. She is on daliresp for the last 2 months, on Trelegy. Has albuterol that she uses . She is on flonase, zyrtec. On esomeprazole. Her last flare was over 6 months ago. COVID vaccine up to date, Flu up to date. Needs PNA shot after age 51 PFT done today reviewed by me show very severe obstruction. Restricted volumes, decreased diffusion capacity.   She needs 6 more green e-tanks for her O2 from [employer].    03/14/2021 INTERIM HX Patient presents today for 6 month follow-up COPD.  Her breathing has more some worse over the last two week with np cough. Feels it is related to seasonal allergies. She is using flonase nasal spray daily. Asking to try tesslon perles. She has not had a recent exacerbation, prior to being on Daliresp she was experiencing frequent flare ups. Maintained on Trelegy 100, prn albuterol and Daliresp. She is on oxygen using 2-4L. She has lost 30 lbs, she has been eating salads and is exercising with bicycle / weights. She states  that she can do more with her daughter/grandchildren and can walker longer distances. She is off all her diabetes medications.   Recent COPD exacerbations: January 19th, 2021-doxycycline, prednisone taper Mar 15, 2020- Z-Pak April 18, 2020- Z-Pak, prednisone taper May 17, 2020- Doxycycline, prednisone taper  03/14/2021 - Increased Trelegy 272mcg x 2 weeks/ holding off on oral steriods and  no indication for abx at this time    TEST/EVENTS :  Cardiac: > 08/23/2017-echocardiogram-LV ejection fraction 60 to 65% > 06/01/20- echocardiogram- LV ejection fraction 60-65%, mild concentric left ventricular hypertrophy, normal diastolic parameters   Pulmonary function testing: > 11/16/2013 PFTs-FVC 1.58 (67% predicted), postbronchodilator ratio 60, postbronchodilator FEV1 1.00 (53% predicted), no significant bronchodilator response, mid flow reversibility after administration of bronchodilator, DLCO 39 > 01/12/19 PFTs-FVC 1.18 (52%), FEV1 0.66 (37%), ratio 56, borderline bronchodilator response, DLCO correlated 6.07 (34%)  Imaging: > October 2020 CT chest -Negfor PE,+emphysema with no acute process. > 05/11/20 CTA- No PE, Chronic emphysematous changes in the upper lobes. Chronic linear scarring at the lung bases, left more than right. No effusions or acute infiltrates.  Allergies  Allergen Reactions  . Acetaminophen Other (See Comments)    Pt was told that she could not take Tylenol, does not know why.    Immunization History  Administered Date(s) Administered  . Influenza Split 09/20/2011, 09/12/2012, 08/12/2013  . Influenza,inj,Quad PF,6+ Mos 08/03/2014, 08/12/2015, 08/12/2017, 07/29/2018  . Influenza,inj,Quad PF,6-35 Mos 09/11/2019  . PFIZER(Purple Top)SARS-COV-2 Vaccination 02/05/2020, 02/25/2020, 11/08/2020  . Pneumococcal Conjugate-13 09/24/2017  . Pneumococcal Polysaccharide-23 11/12/2009    Past Medical History:  Diagnosis Date  . Anemia    chronis  . Anxiety   . Arthritis   . Cervical cancer ([place]) 1990   cervical   . Chronic diastolic CHF (congestive heart failure) ([place]) 08/10/2016  . COPD (chronic obstructive pulmonary disease) ([place])    O2 dependent. Pulmo: [name]  . Diabetes mellitus without complication ([place])    a. A1c 8.3 in 11/2015  . Emphysema   . Gallstones    s/p cholecystectomy  . GERD (gastroesophageal reflux disease)   . Gout   . History  of home oxygen therapy    "2.5L; 24/7" (08/10/2016)  . Hx of cardiac catheterization    a. LHC at [facility] in [place], [place] 09/2008:  Normal coronary arteries EF 70%. b. LHC 11/2016: normal cors, normal LVEDP, EF 55-65%.  . Hyperlipidemia   . Hypertension   . Hypoxia 11/17/2018  . Morbid obesity ([place])   . OSA on CPAP    CPAP at night   . Pneumonia   . Sickle cell trait ([place])   . Tobacco abuse    a. up to 3ppd from age 80 to 36, now 1/4 ppd (01/2013) >> Quit 10/2015    Tobacco History: Social History   Tobacco Use  Smoking Status Former Smoker  . Packs/day: 3.00  . Years: 36.00  . Pack years: 108.00  . Types: Cigarettes  . Quit date: 11/11/2015  . Years since quitting: 5.3  Smokeless Tobacco Never Used  Tobacco Comment   Approx 90 pk-yrs (up to 3ppd until ~ 2009). Smoking 3 cigs per day now.   Counseling given: Not Answered Comment: Approx 90 pk-yrs (up to 3ppd until ~ 2009). Smoking 3 cigs per day now.   Outpatient Medications Prior to Visit  Medication Sig Dispense Refill  . albuterol (PROVENTIL) (2.5 MG/3ML) 0.083% nebulizer solution USE 1 VIAL VIA NEBULIZER 4 TIMES DAILY AS NEEDED. 300 mL 11  . albuterol (  VENTOLIN HFA) 108 (90 Base) MCG/ACT inhaler Inhale 2 puffs into the lungs every 4 (four) hours as needed for wheezing or shortness of breath.    . allopurinol (ZYLOPRIM) 100 MG tablet Take 100 mg by mouth daily.    Marland Kitchen ALPRAZolam (XANAX) 1 MG tablet Take 1 mg by mouth at bedtime as needed for sleep.   0  . aspirin EC 81 MG tablet Take 81 mg by mouth daily.    . cetirizine (ZYRTEC) 10 MG tablet Take 10 mg by mouth daily.    . Cholecalciferol (VITAMIN D PO) Take 1 tablet by mouth daily.    . clobetasol cream (TEMOVATE) 3.97 % Apply 1 application topically daily as needed (irritation from pull ups).   0  . fluticasone (FLONASE) 50 MCG/ACT nasal spray Place 1 spray into both nostrils daily as needed (congestion).    . furosemide (LASIX) 40 MG tablet TAKE 1 TABLET IN THE AM AND 1/2  TABLET IN PM. 135 tablet 1  . HYDROcodone-acetaminophen (NORCO) 7.5-325 MG tablet Take 1 tablet by mouth every 6 (six) hours as needed for moderate pain.    Marland Kitchen linaclotide (LINZESS) 145 MCG CAPS capsule Take 145 mcg by mouth daily.    . meclizine (ANTIVERT) 25 MG tablet Take 25 mg by mouth every 6 (six) hours as needed.    . metoprolol succinate (TOPROL-XL) 50 MG 24 hr tablet Take 1 tablet (50 mg total) by mouth daily. Please make overdue follow up appt for further refills 90 tablet 0  . OXYGEN Inhale 2.5 L into the lungs continuous.     Marland Kitchen PAZEO 0.7 % SOLN Place 1 drop into both eyes at bedtime.    . Potassium Chloride ER 20 MEQ TBCR Take 1 tablet by mouth daily. Please make yearly appt with Dr. Radford Pax for June 2022 for future refills. Thank you 1st attempt 90 tablet 0  . Respiratory Therapy Supplies (FLUTTER) DEVI 1 Device by Does not apply route 3 (three) times daily. 1 each 0  . roflumilast (DALIRESP) 500 MCG TABS tablet Take 1 tablet (500 mcg total) by mouth daily. 30 tablet 11  . spironolactone (ALDACTONE) 25 MG tablet Take 0.5 tablets (12.5 mg total) by mouth daily. 15 tablet 12  . TRELEGY ELLIPTA 100-62.5-25 MCG/INH AEPB INHALE 1 PUFF INTO THE LUNGS ONCE DAILY AS DIRECTED. 60 each 5  . zolpidem (AMBIEN) 10 MG tablet Take 10 mg by mouth at bedtime.    Marland Kitchen esomeprazole (NEXIUM) 40 MG capsule Take 40 mg by mouth daily at 12 noon. (Patient not taking: No sig reported)    . doxycycline (VIBRA-TABS) 100 MG tablet Take 1 tablet (100 mg total) by mouth 2 (two) times daily. 14 tablet 0  . glipiZIDE (GLUCOTROL) 10 MG tablet Take 10 mg by mouth 2 (two) times daily before a meal.     . insulin glargine (LANTUS) 100 unit/mL SOPN Inject 18 Units into the skin at bedtime.     . insulin lispro (HUMALOG) 100 UNIT/ML injection Inject 6-10 Units into the skin once. 6units for sugar over 200 10 units for sugar over 300    . Melatonin 2.5 MG CHEW Chew 2.5 mg by mouth See admin instructions. Only takes when waking  up in middle of the night (Patient not taking: Reported on 08/24/2020)    . mupirocin ointment (BACTROBAN) 2 % mupirocin 2 % topical ointment  apply to NOSE twice a day    . ondansetron (ZOFRAN ODT) 4 MG disintegrating tablet Take 1 tablet (  4 mg total) by mouth every 8 (eight) hours as needed for nausea or vomiting. 20 tablet 0  . sitaGLIPtin (JANUVIA) 100 MG tablet Take 100 mg by mouth daily.     No facility-administered medications prior to visit.   Review of Systems  Review of Systems  Constitutional: Negative.   HENT: Positive for congestion.   Respiratory: Positive for cough and shortness of breath. Negative for chest tightness and wheezing.   Cardiovascular: Positive for leg swelling.   Physical Exam  BP 110/60 (BP Location: Left Arm, Cuff Size: Normal)   Pulse 71   Temp (!) 97.5 F (36.4 C) (Temporal)   Ht 5' (1.524 m)   Wt 187 lb (84.8 kg)   LMP 06/04/2000 (LMP Unknown) Comment: tubal ligation  SpO2 96% Comment: 3L  BMI 36.52 kg/m  Physical Exam Constitutional:      Appearance: Normal appearance.  Cardiovascular:     Rate and Rhythm: Normal rate and regular rhythm.  Neurological:     General: No focal deficit present.     Mental Status: She is alert and oriented to person, place, and time. Mental status is at baseline.  Psychiatric:        Mood and Affect: Mood normal.        Behavior: Behavior normal.        Thought Content: Thought content normal.        Judgment: Judgment normal.      Lab Results:  CBC    Component Value Date/Time   WBC 5.7 05/10/2020 1004   WBC 6.2 08/31/2019 1800   RBC 4.30 05/10/2020 1004   RBC 4.07 08/31/2019 1800   HGB 12.3 05/10/2020 1004   HCT 36.9 05/10/2020 1004   PLT 193 05/10/2020 1004   MCV 86 05/10/2020 1004   MCH 28.6 05/10/2020 1004   MCH 28.3 08/31/2019 1800   MCHC 33.3 05/10/2020 1004   MCHC 33.1 08/31/2019 1800   RDW 15.6 (H) 05/10/2020 1004   LYMPHSABS 1.8 08/31/2019 1800   MONOABS 0.6 08/31/2019 1800    EOSABS 0.3 08/31/2019 1800   BASOSABS 0.0 08/31/2019 1800    BMET    Component Value Date/Time   NA 143 05/10/2020 1004   K 4.2 05/10/2020 1004   CL 100 05/10/2020 1004   CO2 34 (H) 05/10/2020 1004   GLUCOSE 162 (H) 05/10/2020 1004   GLUCOSE 127 (H) 08/31/2019 1800   BUN 14 05/10/2020 1004   CREATININE 1.02 (H) 05/10/2020 1004   CALCIUM 9.9 05/10/2020 1004   GFRNONAA 60 05/10/2020 1004   GFRAA 69 05/10/2020 1004    BNP    Component Value Date/Time   BNP 11.3 08/31/2019 1800    ProBNP    Component Value Date/Time   PROBNP 10 05/10/2020 1004   PROBNP 16.1 07/05/2014 1230    Imaging: No results found.   Assessment & Plan:   COPD with acute exacerbation ([place]) - She is doing quite well overall. Increased sob and cough over the last 2 weeks likely exacerbated by seasonal allergies. She is compliant with Trelegy 176mcg and Daliresp 538mcg. She previously was experiencing COPD exacerbations 4-5 times a year prior to starting Daliresp. She has lost 30 lbs by exercising and watching her diet. No GI symptoms. She would like to avoid oral prednisone use if possible. Recommend Trelegy 278mcg sample x 2 weeks for flare-up of her COPD symptoms then resume Trelegy 119mcg. Sending in Tesslon perles 200mg  TID for cough and recommend she continue to use Flonase  nasal spray. FU in 6 months with [name] or APP.  Chronic respiratory failure with hypoxia (HCC) - Continue to wear 2-4L oxygen (titrate to keep O2 between 88-92%)   Martyn Ehrich, NP 03/14/2021

## 2021-03-14 NOTE — Assessment & Plan Note (Addendum)
-   She is doing quite well overall. Increased sob and cough over the last 2 weeks likely exacerbated by seasonal allergies. She is compliant with Trelegy 149mcg and Daliresp 537mcg. She previously was experiencing COPD exacerbations 4-5 times a year prior to starting Daliresp. She has lost 30 lbs by exercising and watching her diet. No GI symptoms. She would like to avoid oral prednisone use if possible. Recommend Trelegy 269mcg sample x 2 weeks for flare-up of her COPD symptoms then resume Trelegy 120mcg. Sending in Tesslon perles 200mg  TID for cough and recommend she continue to use Flonase nasal spray. FU in 6 months with [name] or APP.

## 2021-03-14 NOTE — Assessment & Plan Note (Signed)
-   Continue to wear 2-4L oxygen (titrate to keep O2 between 88-92%)

## 2021-03-14 NOTE — Patient Instructions (Addendum)
Recommendations: - Use Trelegy 262mcg sample for 2 weeks; then resume Trelegy 151mcg  - Continue Daliresp 530mcg daily  - Continue Flonase nasal spray daily  - Continue to wear 2-4L oxygen (titrate to keep O2 between 88-92%) - Sending in RX for Tesslon perles take three times a day as needed for cough  - If your cough worsens please notify office   Follow-up - 6 months with [name] or Eustaquio Maize NP

## 2021-03-15 ENCOUNTER — Other Ambulatory Visit: Payer: Self-pay | Admitting: Cardiology

## 2021-03-15 ENCOUNTER — Telehealth: Payer: Self-pay | Admitting: Emergency Medicine

## 2021-03-15 NOTE — Telephone Encounter (Signed)
Called [facility] to make sure Tessalon prescription sent by Eustaquio Maize, NP was received yesterday.  Pharmacist stated prescription was received, but Patient does not have insurance so she needs to let them know to fill prescription.  I asked cost of Tessalon and was told cost for #60 was $22.19, but Patient can get prescription 1/2 filled if needed. I checked prices on [facility] and Patient can receive Tessalon cheaper at [facility] for $8.88, if needed. ATC Patient. LM to call back.

## 2021-03-20 NOTE — Telephone Encounter (Signed)
Lmtcb for pt.  

## 2021-03-22 NOTE — Telephone Encounter (Signed)
Message will be closed per triage protocol as we have tried to get into contact with no success.

## 2021-04-12 ENCOUNTER — Ambulatory Visit: Payer: Medicaid Other | Admitting: Cardiology

## 2021-04-21 ENCOUNTER — Other Ambulatory Visit: Payer: Self-pay | Admitting: Cardiology

## 2021-05-01 ENCOUNTER — Telehealth: Payer: Self-pay | Admitting: Primary Care

## 2021-05-01 ENCOUNTER — Other Ambulatory Visit: Payer: Self-pay | Admitting: Emergency Medicine

## 2021-05-01 MED ORDER — AZITHROMYCIN 250 MG PO TABS
ORAL_TABLET | ORAL | 0 refills | Status: DC
Start: 2021-05-01 — End: 2021-09-20

## 2021-05-01 MED ORDER — PREDNISONE 10 MG PO TABS
ORAL_TABLET | ORAL | 0 refills | Status: DC
Start: 2021-05-01 — End: 2021-09-20

## 2021-05-01 NOTE — Telephone Encounter (Signed)
Called and spoke with pt who states she has had complaints of cough, increased SOB, wheezing which all began about 2-3 weeks ago but have just gradually become worse. Pt was prescribed tessalon for cough at last OV with BW 5/3 which she said has not helped. States that she has been doing at least 3 neb treatments a day to see if that would help.  Pt said she has had her cough for at least 2 months and it is not getting any better.  Pt also said that her breathing has been worse the last 2-3 days due to the lawn recently getting mowed and states that overnight this past night 6/19 she had to use her rescue inhaler 3 times overnight but states that she had to use it a total of 8 times all day.  Pt denies any complaints of fever. Pt has done home covid test last one being done 6/17 which was negative.  Pt is still using 3L O2 at rest and uses up to 4L with ambulation.  Pt stated that she has not had a flare-up of her COPD since beginning daliresp until now. Pt is requesting abx as well as prednisone to see if it would help with her symptoms.  [name], please advise.

## 2021-05-01 NOTE — Telephone Encounter (Signed)
OK to use prednisone taper >> Take 40mg  daily for 3 days, then 30mg  daily for 3 days, then 20mg  daily for 3 days, then 10mg  daily for 3 days, then stop  Azithro >> Z pack  She probably need to be seen by RB or APP in 1-2 weeks to insure improved

## 2021-05-01 NOTE — Telephone Encounter (Signed)
Pt is having difficulty breathing. States that albuterol did not helping her get through the night, and this has been going on for a few weeks now. Wonders if sher can be prescribed prednisone and a zpac to help her. Still coughing having SOB. Please advise.

## 2021-05-01 NOTE — Telephone Encounter (Signed)
Called and spoke with Patient.  [name] recommendations given. Understanding stated.  Prescriptions sent to requested [facility]. Patient offered OV with Beth, NP, but stated she will call back to schedule OV, if no better by the end of the week. Nothing further at this time.

## 2021-05-10 ENCOUNTER — Other Ambulatory Visit: Payer: Self-pay | Admitting: Internal Medicine

## 2021-05-10 DIAGNOSIS — Z1231 Encounter for screening mammogram for malignant neoplasm of breast: Secondary | ICD-10-CM

## 2021-05-22 ENCOUNTER — Other Ambulatory Visit: Payer: Self-pay | Admitting: Emergency Medicine

## 2021-05-23 ENCOUNTER — Other Ambulatory Visit: Payer: Self-pay | Admitting: Cardiology

## 2021-05-24 ENCOUNTER — Telehealth: Payer: Self-pay | Admitting: Cardiology

## 2021-05-24 NOTE — Telephone Encounter (Signed)
Spoke with the patient and advised her that she needs to follow up with her PCP ASAP. Offered to call and get her an appointment. Patient states that she will give them a call.

## 2021-05-24 NOTE — Telephone Encounter (Signed)
Pt c/o swelling: STAT is pt has developed SOB within 24 hours  If swelling, where is the swelling located? Top of left foot to the middle of her leg   How much weight have you gained and in what time span? She is not sure   Have you gained 3 pounds in a day or 5 pounds in a week? She is not sure   Do you have a log of your daily weights (if so, list)? No   Are you currently taking a fluid pill? Yes  1 tab in the morning and and 1/2 at night   Are you currently SOB? Pt has COPD   Have you traveled recently? No   Pt stated this stated over a month ago.  Pt did not have transportation to get to her 6/1 appt Best number [phone]

## 2021-05-24 NOTE — Telephone Encounter (Signed)
Spoke with the patient who states that she is having swelling in her left foot and leg. Patient has had chronic left foot swelling, pain and tingling. Had LE dopplers last year that were negative.  She states that swelling has been on/off over the last several months. She states that her foot and leg feel very tight and bottom of her foot has some numbness. She has seen podiatry in the past which I have advised her to reach back out to.  She is taking furosemide 40 mg in the morning and 20 mg in the evening. Advised patient to elevate her legs and watch intake of sodium.  Patient missed her yearly FU in June. I have rescheduled her for an appointment in September.

## 2021-06-07 ENCOUNTER — Other Ambulatory Visit: Payer: Self-pay | Admitting: Cardiology

## 2021-06-12 ENCOUNTER — Telehealth: Payer: Self-pay | Admitting: Emergency Medicine

## 2021-06-12 NOTE — Telephone Encounter (Signed)
Called patient to confirm her insurance. She still has traditional Medicaid. I advised her that I would go ahead and start the PA for Trelegy. She verbalized understanding.   PA has been completed. Will fax once RB has signed it.

## 2021-06-16 ENCOUNTER — Telehealth: Payer: Self-pay | Admitting: Emergency Medicine

## 2021-06-16 NOTE — Telephone Encounter (Signed)
LMTCB  We do not have any samples of trelegy at this time.

## 2021-06-16 NOTE — Telephone Encounter (Signed)
ATC patient unable to reach LM to call back office (x1)  

## 2021-06-16 NOTE — Telephone Encounter (Signed)
Pt returning a phone call. Pt can be reached at [phone]

## 2021-06-20 NOTE — Telephone Encounter (Signed)
Patient is returning phone call. Patient phone number is [phone].

## 2021-06-20 NOTE — Telephone Encounter (Signed)
[name], do you know if this was signed by RB yet? Thanks.

## 2021-06-20 NOTE — Telephone Encounter (Signed)
Yes, form was completed and signed by RB last week. Form was sent to scan last Wednesday.

## 2021-06-20 NOTE — Telephone Encounter (Signed)
Call made to medicaid to inquire about PA submitted for Trelegy. They are unable to find patient in system at all using name, dob, medicaid ID number, and last four of social.   [facility] with patient.

## 2021-06-21 ENCOUNTER — Telehealth: Payer: Self-pay | Admitting: Emergency Medicine

## 2021-06-21 NOTE — Telephone Encounter (Signed)
Spoke with the pt  She is calling back about status of PA for trelegy  Looks like PA submitted but having issues with her medicaid status Pt says she does have medicaid and does not know what the issue is  I called [place] at [phone]  Was placed on a long hold, then someone picked up and they could not hear me  Spend a total of 20 min working on this   Before we attempt another lengthy phone call, will forward to [place] to see if she has received any approval/denial from ins  Estill Bamberg, please check RB lookat and might want to look at the PA's up front for this, thank you!

## 2021-06-21 NOTE — Telephone Encounter (Signed)
LMTCB and will close per protocol  

## 2021-06-22 MED ORDER — TRELEGY ELLIPTA 100-62.5-25 MCG/INH IN AEPB
1.0000 | INHALATION_SPRAY | Freq: Every day | RESPIRATORY_TRACT | 0 refills | Status: DC
Start: 2021-06-22 — End: 2021-12-28

## 2021-06-22 NOTE — Telephone Encounter (Signed)
Patient called wanted to talk to a supervisor She is out of her Trelegy for 5 days and starting to feel worse in her breathing. I told her we have not had anything show up for the PA on her medication but we could give her 2 samples of Trelegy 100 until we figure out the paperwork.   Patient states she will be here around 11am to pick up samples.

## 2021-06-23 ENCOUNTER — Telehealth: Payer: Self-pay | Admitting: *Deleted

## 2021-06-23 MED ORDER — TRELEGY ELLIPTA 100-62.5-25 MCG/INH IN AEPB: INHALATION_SPRAY | RESPIRATORY_TRACT | 5 refills | Status: DC

## 2021-06-23 NOTE — Telephone Encounter (Signed)
I have called and done the PA's on Trelegy and Daliresp.    Trelegy approval #  [id number] From 06/23/2021 thru 06/18/2022   Daliresp approval # [id number] From 06/23/2021-06/18/2022  I have called the pharmacy and they are aware.  I have contacted the pt and she is aware.

## 2021-07-01 ENCOUNTER — Other Ambulatory Visit: Payer: Self-pay | Admitting: Cardiology

## 2021-07-04 ENCOUNTER — Ambulatory Visit: Payer: Medicaid Other

## 2021-07-13 ENCOUNTER — Ambulatory Visit (INDEPENDENT_AMBULATORY_CARE_PROVIDER_SITE_OTHER): Payer: Medicaid Other | Admitting: Cardiology

## 2021-07-13 ENCOUNTER — Encounter: Payer: Self-pay | Admitting: Cardiology

## 2021-07-13 ENCOUNTER — Other Ambulatory Visit: Payer: Self-pay

## 2021-07-13 VITALS — BP 132/58 | HR 70 | Ht 60.0 in | Wt 184.8 lb

## 2021-07-13 DIAGNOSIS — I5032 Chronic diastolic (congestive) heart failure: Secondary | ICD-10-CM | POA: Diagnosis not present

## 2021-07-13 DIAGNOSIS — I10 Essential (primary) hypertension: Secondary | ICD-10-CM

## 2021-07-13 MED ORDER — POTASSIUM CHLORIDE CRYS ER 20 MEQ PO TBCR: 20.0000 meq | EXTENDED_RELEASE_TABLET | Freq: Every morning | ORAL | 3 refills | Status: DC

## 2021-07-13 MED ORDER — SPIRONOLACTONE 25 MG PO TABS: 12.5000 mg | ORAL_TABLET | Freq: Every day | ORAL | 12 refills | Status: AC

## 2021-07-13 MED ORDER — METOPROLOL SUCCINATE ER 50 MG PO TB24: 50.0000 mg | ORAL_TABLET | Freq: Every morning | ORAL | 3 refills | Status: DC

## 2021-07-13 MED ORDER — FUROSEMIDE 40 MG PO TABS
ORAL_TABLET | ORAL | 3 refills | Status: DC
Start: 2021-07-13 — End: 2021-08-28

## 2021-07-13 NOTE — Addendum Note (Signed)
Addended by: Antonieta Iba on: 07/13/2021 11:27 AM   Modules accepted: Orders

## 2021-07-13 NOTE — Patient Instructions (Signed)

## 2021-07-13 NOTE — Progress Notes (Signed)
Date:  07/13/2021   ID:  Stephanie Schultz, DOB Nov 02, 1956, MRN [id number]  PCP:  [name], MD  Cardiologist:  Fransico Him, MD  Electrophysiologist:  None   Chief Complaint:  CHF  History of Present Illness:    Stephanie Schultz is a 65 y.o. female with a history of  DM2, HTN, hyperlipidemia, COPD with chronic respiratory failure on chronic O2, OSA on CPAP, prior tobacco abuse, chronic diastolic CHF and  morbid obesity.  She had a cath in 2009 showing normal coronary arteries and normal LVF.  She had recurrent CP and underwent repeat LHC 12/03/16 with normal coronaries, EF 55-65%, normal LVEDP. Based on symptoms, increased O2 requirements and increased sputum production, she was felt to have an acute COPD exacerbation and was treated with improvement in symptoms.   SHe is here today for followup and is doing well.  She denies any chest pain or pressure, PND, orthopnea, LE edema, dizziness, palpitations or syncope. She has chronic DOE related to COPD but is very stable on 3L [place] 24/7.  She is compliant with her meds and is tolerating meds with no SE.      Prior CV studies:   The following studies were reviewed today:  None  Past Medical History:  Diagnosis Date   Anemia    chronis   Anxiety    Arthritis    Cervical cancer (HCC) 1990   cervical    Chronic diastolic CHF (congestive heart failure) ([place]) 08/10/2016   COPD (chronic obstructive pulmonary disease) (HCC)    O2 dependent. Pulmo: [name]   Diabetes mellitus without complication ([place])    a. A1c 8.3 in 11/2015   Emphysema    Gallstones    s/p cholecystectomy   GERD (gastroesophageal reflux disease)    Gout    History of home oxygen therapy    "2.5L; 24/7" (08/10/2016)   Hx of cardiac catheterization    a. LHC at [facility] in [place], [place] 09/2008:  Normal coronary arteries EF 70%. b. LHC 11/2016: normal cors, normal LVEDP, EF 55-65%.   Hyperlipidemia    Hypertension    Hypoxia 11/17/2018   Morbid obesity  (HCC)    OSA on CPAP    CPAP at night    Pneumonia    Sickle cell trait ([place])    Tobacco abuse    a. up to 3ppd from age 66 to 34, now 1/4 ppd (01/2013) >> Quit 10/2015   Past Surgical History:  Procedure Laterality Date   BREAST BIOPSY Right 02/23/2019   x2   BREAST BIOPSY Left 2014   CARDIAC CATHETERIZATION     CARDIAC CATHETERIZATION N/A 12/03/2016   Procedure: Left Heart Cath and Coronary Angiography;  Surgeon: Peter M Martinique, MD;  Location: [facility];  Service: Cardiovascular;  Laterality: N/A;   CHOLECYSTECTOMY N/A 12/13/2015   Procedure: LAPAROSCOPIC CHOLECYSTECTOMY;  Surgeon: Ralene Ok, MD;  Location: [facility];  Service: General;  Laterality: N/A;   COLONOSCOPY  09/05/2012   Procedure: COLONOSCOPY;  Surgeon: Beryle Beams, MD;  Location: [facility];  Service: Endoscopy;  Laterality: N/A;   COLONOSCOPY WITH PROPOFOL N/A 09/02/2015   Procedure: COLONOSCOPY WITH PROPOFOL;  Surgeon: Carol Ada, MD;  Location: [facility];  Service: Endoscopy;  Laterality: N/A;   ESOPHAGOGASTRODUODENOSCOPY (EGD) WITH PROPOFOL N/A 03/29/2017   Procedure: ESOPHAGOGASTRODUODENOSCOPY (EGD) WITH PROPOFOL;  Surgeon: Carol Ada, MD;  Location: [facility];  Service: Endoscopy;  Laterality: N/A;   TUBAL LIGATION  Current Meds  Medication Sig   albuterol (PROVENTIL) (2.5 MG/3ML) 0.083% nebulizer solution USE 1 VIAL VIA NEBULIZER 4 TIMES DAILY AS NEEDED.   albuterol (VENTOLIN HFA) 108 (90 Base) MCG/ACT inhaler Inhale 2 puffs into the lungs every 4 (four) hours as needed for wheezing or shortness of breath.   allopurinol (ZYLOPRIM) 100 MG tablet Take 100 mg by mouth daily.   ALPRAZolam (XANAX) 1 MG tablet Take 1 mg by mouth at bedtime as needed for sleep.    aspirin EC 81 MG tablet Take 81 mg by mouth daily.   cetirizine (ZYRTEC) 10 MG tablet Take 10 mg by mouth daily.   Cholecalciferol (VITAMIN D PO) Take 1 tablet by mouth daily.   clobetasol cream (TEMOVATE) [id number] % Apply 1  application topically daily as needed (irritation from pull ups).    DALIRESP 500 MCG TABS tablet TAKE 1 TABLET(500 MCG) BY MOUTH DAILY   esomeprazole (NEXIUM) 40 MG capsule Take 40 mg by mouth daily at 12 noon.   fluticasone (FLONASE) 50 MCG/ACT nasal spray Place 1 spray into both nostrils daily as needed (congestion).   Fluticasone-Umeclidin-Vilant (TRELEGY ELLIPTA) 100-62.5-25 MCG/INH AEPB Inhale 1 puff into the lungs daily.   furosemide (LASIX) 40 MG tablet TAKE ONE TABLET EVERY MORNING and TAKE 1/2 TABLET EVERY EVENING - pt needs to keep upcoming appt in Sept for further refills   HYDROcodone-acetaminophen (NORCO) 7.5-325 MG tablet Take 1 tablet by mouth every 6 (six) hours as needed for moderate pain.   linaclotide (LINZESS) 145 MCG CAPS capsule Take 145 mcg by mouth daily.   meclizine (ANTIVERT) 25 MG tablet Take 25 mg by mouth every 6 (six) hours as needed.   metoprolol succinate (TOPROL-XL) 50 MG 24 hr tablet TAKE ONE TABLET EVERY MORNING   OXYGEN Inhale 2.5 L into the lungs continuous.    PAZEO 0.7 % SOLN Place 1 drop into both eyes at bedtime.   potassium chloride SA (KLOR-CON) 20 MEQ tablet TAKE ONE TABLET EVERY MORNING   Respiratory Therapy Supplies (FLUTTER) DEVI 1 Device by Does not apply route 3 (three) times daily.   spironolactone (ALDACTONE) 25 MG tablet Take 0.5 tablets (12.5 mg total) by mouth daily.   zolpidem (AMBIEN) 10 MG tablet Take 10 mg by mouth at bedtime.     Allergies:   Acetaminophen   Social History   Tobacco Use   Smoking status: Former    Packs/day: 3.00    Years: 36.00    Pack years: 108.00    Types: Cigarettes    Quit date: 11/11/2015    Years since quitting: 5.6   Smokeless tobacco: Never   Tobacco comments:    Approx 90 pk-yrs (up to 3ppd until ~ 2009). Smoking 3 cigs per day now.  Vaping Use   Vaping Use: Never used  Substance Use Topics   Alcohol use: No    Alcohol/week: 0.0 standard drinks   Drug use: No     Family Hx: The patient's  family history includes Diabetes in her mother; Lung cancer in her paternal aunt and paternal grandfather; Myasthenia gravis in her mother; Other in her father and another family member. There is no history of Heart attack, Heart failure, or Breast cancer.  ROS:   Please see the history of present illness.     All other systems reviewed and are negative.   Labs/Other Tests and Data Reviewed:    Recent Labs: No results found for requested labs within last 8760 hours.   Recent Lipid  Panel Lab Results  Component Value Date/Time   CHOL 119 12/01/2016 02:05 AM   TRIG 212 (H) 12/01/2016 02:05 AM   HDL 34 (L) 12/01/2016 02:05 AM   CHOLHDL 3.5 12/01/2016 02:05 AM   LDLCALC 43 12/01/2016 02:05 AM    Wt Readings from Last 3 Encounters:  07/13/21 184 lb 12.8 oz (83.8 kg)  03/14/21 187 lb (84.8 kg)  08/24/20 198 lb 6.4 oz (90 kg)     Objective:    Vital Signs:  BP (!) 132/58   Pulse 70   Ht 5' (1.524 m)   Wt 184 lb 12.8 oz (83.8 kg)   LMP 06/04/2000 (LMP Unknown) Comment: tubal ligation  SpO2 97% Comment: on 3L of oxygen  BMI 36.09 kg/m    GEN: Well nourished, well developed in no acute distress HEENT: Normal NECK: No JVD; No carotid bruits LYMPHATICS: No lymphadenopathy CARDIAC:RRR, no murmurs, rubs, gallops RESPIRATORY: decreased BS throughout ABDOMEN: Soft, non-tender, non-distended MUSCULOSKELETAL:  No edema; No deformity  SKIN: Warm and dry NEUROLOGIC:  Alert and oriented x 3 PSYCHIATRIC:  Normal affect    EKG was performed in the office today and demonstrates NSR with no ST changes ASSESSMENT & PLAN:    1.   Chronic diastolic CHF -she has chronic DOE from COPD and is on chronic 3L O2 [place]>>breathing is stable -she appears euvolemic on exam today -her weight is stable and she has lost 14lbs in the past year -Continue prescription drug management with lasix '40mg'$  qam and '20mg'$  qpm -check BMET that PCP did this am  2.  Hypertension -BP controlled on exam  today -Continue prescription drug management with Toprol XL '50mg'$  daily and spiro 12.'5mg'$  daily>refilled   3.  Obesity  -I have encouraged her to get into a routine exercise program and cut back on carbs and portions.    Medication Adjustments/Labs and Tests Ordered: Current medicines are reviewed at length with the patient today.  Concerns regarding medicines are outlined above.  Tests Ordered: Orders Placed This Encounter  Procedures   EKG 12-Lead    Medication Changes: No orders of the defined types were placed in this encounter.   Disposition:  Follow up in 6 month(s)  Signed, Fransico Him, MD  07/13/2021 11:21 AM    [facility]

## 2021-08-17 ENCOUNTER — Other Ambulatory Visit: Payer: Self-pay

## 2021-08-17 ENCOUNTER — Ambulatory Visit
Admission: RE | Admit: 2021-08-17 | Discharge: 2021-08-17 | Disposition: A | Discharge status: Home / Self Care | Payer: Medicaid Other | Source: Ambulatory Visit | Attending: Internal Medicine | Admitting: Internal Medicine

## 2021-08-17 DIAGNOSIS — Z1231 Encounter for screening mammogram for malignant neoplasm of breast: Secondary | ICD-10-CM

## 2021-08-23 ENCOUNTER — Other Ambulatory Visit: Payer: Self-pay | Admitting: Internal Medicine

## 2021-08-23 DIAGNOSIS — R928 Other abnormal and inconclusive findings on diagnostic imaging of breast: Secondary | ICD-10-CM

## 2021-08-26 ENCOUNTER — Other Ambulatory Visit: Payer: Self-pay | Admitting: Cardiology

## 2021-09-08 ENCOUNTER — Other Ambulatory Visit: Payer: Self-pay

## 2021-09-08 ENCOUNTER — Ambulatory Visit: Payer: Medicaid Other

## 2021-09-08 ENCOUNTER — Ambulatory Visit
Admission: RE | Admit: 2021-09-08 | Discharge: 2021-09-08 | Disposition: A | Discharge status: Home / Self Care | Payer: Medicaid Other | Source: Ambulatory Visit | Attending: Internal Medicine | Admitting: Internal Medicine

## 2021-09-08 DIAGNOSIS — R928 Other abnormal and inconclusive findings on diagnostic imaging of breast: Secondary | ICD-10-CM

## 2021-09-14 ENCOUNTER — Ambulatory Visit: Payer: Medicaid Other | Admitting: Primary Care

## 2021-09-20 ENCOUNTER — Ambulatory Visit (INDEPENDENT_AMBULATORY_CARE_PROVIDER_SITE_OTHER): Payer: Medicaid Other | Admitting: Primary Care

## 2021-09-20 ENCOUNTER — Other Ambulatory Visit: Payer: Self-pay

## 2021-09-20 ENCOUNTER — Encounter: Payer: Self-pay | Admitting: Primary Care

## 2021-09-20 VITALS — BP 128/64 | HR 82 | Temp 98.2°F | Ht 60.0 in | Wt 183.0 lb

## 2021-09-20 DIAGNOSIS — Z87891 Personal history of nicotine dependence: Secondary | ICD-10-CM

## 2021-09-20 DIAGNOSIS — J9611 Chronic respiratory failure with hypoxia: Secondary | ICD-10-CM | POA: Diagnosis not present

## 2021-09-20 DIAGNOSIS — J439 Emphysema, unspecified: Secondary | ICD-10-CM | POA: Diagnosis not present

## 2021-09-20 DIAGNOSIS — J309 Allergic rhinitis, unspecified: Secondary | ICD-10-CM | POA: Diagnosis not present

## 2021-09-20 MED ORDER — BENZONATATE 100 MG PO CAPS: 200.0000 mg | ORAL_CAPSULE | Freq: Three times a day (TID) | ORAL | 1 refills | Status: DC | PRN

## 2021-09-20 NOTE — Patient Instructions (Addendum)
  Wonderful seeing you today Ms Kilmer, I am so glad you are doing well  Recommendations: Continue Trelegy 1 puff daily Continue Daliresp 521mcg daily Use Albuterol nebulizer every 6 hours as needed for breakthrough shortness of breath/wheezing Continue supplemental oxygen 2-4L to maintain >90%   Orders: DME order to adapt- add humidification to oxygen (ordered)  Referral: Lung cancer screening program re: former smoker >30 pack year hx  (ordered)  Follow-up: 6 months with [name] or Eustaquio Maize NP

## 2021-09-20 NOTE — Assessment & Plan Note (Addendum)
-   Stable; She is doing very well. Experiencing less exacerbations since starting Daliresp. Using SABA twice a day.  - Continue Trelegy 153mcg one puff daily, Daliresp 588mcg daily, prn albuterol hfa/neb every 6 hours for breakthough sob/wheezing and tessalon perles for cough  - FU in 6 months or sooner if needed

## 2021-09-20 NOTE — Progress Notes (Signed)
@Patient  ID: Stephanie Schultz, female    DOB: 10/23/1956, 65 y.o.   MRN: [id number]  Chief Complaint  Patient presents with   Follow-up    Follow up on COPD and oxygen    Referring provider: [name], MD  HPI: 65 year old female, former smoker quit in 2016 (108-pack-year history).  Significant for COPD component, chronic cough, chronic respiratory failure O2 dependent, pulmonary nodule, allergic rhinitis, chronic diastolic heart failure, hypertension, GERD, type 2 diabetes, obesity.  Patient of [name], last seen in May 2022 by pulmonary NP. Maintained on Trelegy, Daliresp, prn albuterol and continuous oxygen at 3-4L/min.  Previous [facility] pulmonary encounters: 08/24/20- ROV, [name]  Follow-up visit for 65 year old woman with very severe COPD with asthmatic features.  Also with superimposed upper airway irritation syndrome and chronic cough.  Followed for hypertension and diastolic dysfunction, obesity.  She has chronic hypoxemia on 2 to 3 L/min with documented nocturnal hypoxemia, no OSA. She reports today that she is doing pretty well. She is on daliresp for the last 2 months, on Trelegy. Has albuterol that she uses . She is on flonase, zyrtec. On esomeprazole. Her last flare was over 6 months ago. COVID vaccine up to date, Flu up to date. Needs PNA shot after age 33 PFT done today reviewed by me show very severe obstruction. Restricted volumes, decreased diffusion capacity.   She needs 6 more green e-tanks for her O2 from [employer].    03/14/2021  Patient presents today for 6 month follow-up COPD.  Her breathing has more some worse over the last two week with np cough. Feels it is related to seasonal allergies. She is using flonase nasal spray daily. Asking to try tesslon perles. She has not had a recent exacerbation, prior to being on Daliresp she was experiencing frequent flare ups. Maintained on Trelegy 100, prn albuterol and Daliresp. She is on oxygen using 2-4L. She has  lost 30 lbs, she has been eating salads and is exercising with bicycle / weights. She states that she can do more with her daughter/grandchildren and can walker longer distances. She is off all her diabetes medications.    09/20/2021- Interim hx  Patient presents today for 6 month follow-up. She is doing very well. She has had significantly less amount of exacerbations his past year since starting Daliresp. She was hospitalized overnight at [facility] in September for COPD. She is doing well today. She has no significant shortness of breath. She keeps a chronic cough, tessalon perles help. She is compliant with Trelegy and daliresp. She uses albuterol nebulizer morning and evening. She is on 2.5-3L oxygen at rest and 4L with exertion. No increased demand.   Dyspnea: None  Cough: Chronic cough  SABA: Use twice a day scheduled  Oxygen: 2.5-4L Nocturnal: 1-2 pillows  Recent COPD exacerbations: January 19th, 2021-doxycycline, prednisone taper Mar 15, 2020- Z-Pak April 18, 2020- Z-Pak, prednisone taper May 17, 2020- Doxycycline, prednisone taper  03/14/2021 - Increased Trelegy 218mcg x 2 weeks/ holding off on oral steriods and no indication for abx at this time  05/01/21- Called in with sob/cough and given zpack and prednisone  07/2021- hospitalizated overnight for COPD exacerbation  TEST/EVENTS :  Cardiac: > 08/23/2017-echocardiogram-LV ejection fraction 60 to 65% > 06/01/20- echocardiogram- LV ejection fraction 60-65%, mild concentric left ventricular hypertrophy, normal diastolic parameters    Pulmonary function testing: > 11/16/2013 PFTs- FVC 1.58 (67% predicted), postbronchodilator ratio 60, postbronchodilator FEV1 1.00 (53% predicted), no significant bronchodilator response, mid flow reversibility  after administration of bronchodilator, DLCO 39 > 01/12/19 PFTs-FVC 1.18 (52%), FEV1 0.66 (37%), ratio 56, borderline bronchodilator response, DLCO correlated 6.07 (34%)   Imaging: > October 2020  CT chest - Neg for PE, +emphysema with no acute process. > 05/11/20 CTA- No PE, Chronic emphysematous changes in the upper lobes. Chronic linear scarring at the lung bases, left more than right. No effusions or acute infiltrates.    Allergies  Allergen Reactions   Acetaminophen Other (See Comments)    Pt was told that she could not take Tylenol, does not know why.    Immunization History  Administered Date(s) Administered   Influenza Split 09/20/2011, 09/12/2012, 08/12/2013   Influenza,inj,Quad PF,6+ Mos 08/03/2014, 08/12/2015, 08/12/2017, 07/29/2018   Influenza,inj,Quad PF,6-35 Mos 09/11/2019   PFIZER(Purple Top)SARS-COV-2 Vaccination 02/05/2020, 02/25/2020, 11/08/2020   Pneumococcal Conjugate-13 09/24/2017   Pneumococcal Polysaccharide-23 11/12/2009    Past Medical History:  Diagnosis Date   Anemia    chronis   Anxiety    Arthritis    Cervical cancer ([place]) 1990   cervical    Chronic diastolic CHF (congestive heart failure) ([place]) 08/10/2016   COPD (chronic obstructive pulmonary disease) ([place] [facility] [place])    O2 dependent. Pulmo: [name]   Diabetes mellitus without complication ([place])    a. A1c 8.3 in 11/2015   Emphysema    Gallstones    s/p cholecystectomy   GERD (gastroesophageal reflux disease)    Gout    History of home oxygen therapy    "2.5L; 24/7" (08/10/2016)   Hx of cardiac catheterization    a. LHC at [facility] in [place] 09/2008:  Normal coronary arteries EF 70%. b. LHC 11/2016: normal cors, normal LVEDP, EF 55-65%.   Hyperlipidemia    Hypertension    Hypoxia 11/17/2018   Morbid obesity (HCC)    OSA on CPAP    CPAP at night    Pneumonia    Sickle cell trait ([place])    Tobacco abuse    a. up to 3ppd from age 44 to 24, now 1/4 ppd (01/2013) >> Quit 10/2015    Tobacco History: Social History   Tobacco Use  Smoking Status Former   Packs/day: 3.00   Years: 36.00   Pack years: 108.00   Types: Cigarettes   Quit date: 11/11/2015   Years since quitting: 5.8  Smokeless  Tobacco Never  Tobacco Comments   Approx 90 pk-yrs (up to 3ppd until ~ 2009). Smoking 3 cigs per day now.   Counseling given: Not Answered Tobacco comments: Approx 90 pk-yrs (up to 3ppd until ~ 2009). Smoking 3 cigs per day now.   Outpatient Medications Prior to Visit  Medication Sig Dispense Refill   albuterol (PROVENTIL) (2.5 MG/3ML) 0.083% nebulizer solution USE 1 VIAL VIA NEBULIZER 4 TIMES DAILY AS NEEDED. 300 mL 11   albuterol (VENTOLIN HFA) 108 (90 Base) MCG/ACT inhaler Inhale 2 puffs into the lungs every 4 (four) hours as needed for wheezing or shortness of breath.     allopurinol (ZYLOPRIM) 100 MG tablet Take 100 mg by mouth daily.     ALPRAZolam (XANAX) 1 MG tablet Take 1 mg by mouth at bedtime as needed for sleep.   0   aspirin EC 81 MG tablet Take 81 mg by mouth daily.     cetirizine (ZYRTEC) 10 MG tablet Take 10 mg by mouth daily.     Cholecalciferol (VITAMIN D PO) Take 1 tablet by mouth daily.     clobetasol cream (TEMOVATE) 7.02 % Apply 1 application topically  daily as needed (irritation from pull ups).   0   DALIRESP 500 MCG TABS tablet TAKE 1 TABLET(500 MCG) BY MOUTH DAILY 30 tablet 11   esomeprazole (NEXIUM) 40 MG capsule Take 40 mg by mouth daily at 12 noon.     fluticasone (FLONASE) 50 MCG/ACT nasal spray Place 1 spray into both nostrils daily as needed (congestion).     Fluticasone-Umeclidin-Vilant (TRELEGY ELLIPTA) 100-62.5-25 MCG/INH AEPB Inhale 1 puff into the lungs daily. 1 each 0   Fluticasone-Umeclidin-Vilant (TRELEGY ELLIPTA) 100-62.5-25 MCG/INH AEPB INHALE 1 PUFF INTO THE LUNGS ONCE DAILY AS DIRECTED. 60 each 5   furosemide (LASIX) 40 MG tablet TAKE ONE TABLET EVERY MORNING and TAKE 1/2 TABLET EVERY EVENING 45 tablet 2   gabapentin (NEURONTIN) 300 MG capsule Take 300 mg by mouth 3 (three) times daily.     HYDROcodone-acetaminophen (NORCO) 7.5-325 MG tablet Take 1 tablet by mouth every 6 (six) hours as needed for moderate pain.     linaclotide (LINZESS) 145 MCG  CAPS capsule Take 145 mcg by mouth daily.     meclizine (ANTIVERT) 25 MG tablet Take 25 mg by mouth every 6 (six) hours as needed.     metoprolol succinate (TOPROL-XL) 50 MG 24 hr tablet TAKE ONE TABLET EVERY MORNING 30 tablet 9   OXYGEN Inhale 2.5 L into the lungs continuous.      PAZEO 0.7 % SOLN Place 1 drop into both eyes at bedtime.     potassium chloride SA (KLOR-CON) 20 MEQ tablet TAKE ONE TABLET EVERY MORNING 30 tablet 9   Respiratory Therapy Supplies (FLUTTER) DEVI 1 Device by Does not apply route 3 (three) times daily. 1 each 0   spironolactone (ALDACTONE) 25 MG tablet Take 0.5 tablets (12.5 mg total) by mouth daily. 15 tablet 12   zolpidem (AMBIEN) 10 MG tablet Take 10 mg by mouth at bedtime.     benzonatate (TESSALON) 100 MG capsule Take 2 capsules (200 mg total) by mouth 3 (three) times daily as needed for cough. 60 capsule 1   predniSONE (DELTASONE) 10 MG tablet 40mg x's3days,30mg x's3days,20mg x's3days,10mg x's3days,stop 30 tablet 0   azithromycin (ZITHROMAX) 250 MG tablet Take as directed (Patient not taking: No sig reported) 6 tablet 0   No facility-administered medications prior to visit.      Review of Systems  Review of Systems  Constitutional: Negative.   HENT: Negative.    Respiratory:  Negative for cough, chest tightness, shortness of breath and wheezing.   Cardiovascular: Negative.     Physical Exam  BP 128/64 (BP Location: Right Arm, Patient Position: Sitting, Cuff Size: Normal)   Pulse 82   Temp 98.2 F (36.8 C) (Oral)   Ht 5' (1.524 m)   Wt 183 lb (83 kg)   LMP 06/04/2000 (LMP Unknown) Comment: tubal ligation  SpO2 98%   BMI 35.74 kg/m  Physical Exam Constitutional:      Appearance: Normal appearance.  HENT:     Head: Normocephalic and atraumatic.  Cardiovascular:     Rate and Rhythm: Normal rate and regular rhythm.  Pulmonary:     Effort: Pulmonary effort is normal.     Breath sounds: Normal breath sounds. No wheezing, rhonchi or rales.   Skin:    General: Skin is warm and dry.  Neurological:     General: No focal deficit present.     Mental Status: She is alert and oriented to person, place, and time. Mental status is at baseline.  Psychiatric:  Mood and Affect: Mood normal.        Behavior: Behavior normal.        Thought Content: Thought content normal.        Judgment: Judgment normal.     Lab Results:  CBC    Component Value Date/Time   WBC 5.7 05/10/2020 1004   WBC 6.2 08/31/2019 1800   RBC 4.30 05/10/2020 1004   RBC 4.07 08/31/2019 1800   HGB 12.3 05/10/2020 1004   HCT 36.9 05/10/2020 1004   PLT 193 05/10/2020 1004   MCV 86 05/10/2020 1004   MCH 28.6 05/10/2020 1004   MCH 28.3 08/31/2019 1800   MCHC 33.3 05/10/2020 1004   MCHC 33.1 08/31/2019 1800   RDW 15.6 (H) 05/10/2020 1004   LYMPHSABS 1.8 08/31/2019 1800   MONOABS 0.6 08/31/2019 1800   EOSABS 0.3 08/31/2019 1800   BASOSABS 0.0 08/31/2019 1800    BMET    Component Value Date/Time   NA 143 05/10/2020 1004   K 4.2 05/10/2020 1004   CL 100 05/10/2020 1004   CO2 34 (H) 05/10/2020 1004   GLUCOSE 162 (H) 05/10/2020 1004   GLUCOSE 127 (H) 08/31/2019 1800   BUN 14 05/10/2020 1004   CREATININE 1.02 (H) 05/10/2020 1004   CALCIUM 9.9 05/10/2020 1004   GFRNONAA 60 05/10/2020 1004   GFRAA 69 05/10/2020 1004    BNP    Component Value Date/Time   BNP 11.3 08/31/2019 1800    ProBNP    Component Value Date/Time   PROBNP 10 05/10/2020 1004   PROBNP 16.1 07/05/2014 1230    Imaging: MM DIAG BREAST TOMO UNI RIGHT  Result Date: 09/08/2021 CLINICAL DATA:  The patient was called back for a right breast asymmetry. EXAM: DIGITAL DIAGNOSTIC UNILATERAL RIGHT MAMMOGRAM WITH TOMOSYNTHESIS AND CAD TECHNIQUE: Right digital diagnostic mammography and breast tomosynthesis was performed. The images were evaluated with computer-aided detection. COMPARISON:  Previous exam(s). ACR Breast Density Category b: There are scattered areas of fibroglandular  density. FINDINGS: The right breast asymmetry resolves on additional imaging. IMPRESSION: No mammographic evidence of malignancy. RECOMMENDATION: Annual screening mammography. I have discussed the findings and recommendations with the patient. If applicable, a reminder letter will be sent to the patient regarding the next appointment. BI-RADS CATEGORY  1: Negative. Electronically Signed   By: Dorise Bullion III M.D.   On: 09/08/2021 11:11    Assessment & Plan:   COPD (chronic obstructive pulmonary disease) ([place]) - Stable; She is doing very well. Experiencing less exacerbations since starting Daliresp. Using SABA twice a day.  - Continue Trelegy 162mcg one puff daily, Daliresp 574mcg daily, prn albuterol hfa/neb every 6 hours for breakthough sob/wheezing and tessalon perles for cough  - FU in 6 months or sooner if needed   Chronic respiratory failure with hypoxia (HCC) - Continue supplemental oxygen 2-4L to maintain >90%  - DME order placed to add humidification to oxygen   Chronic allergic rhinitis - Continue Zyrtec and Flonase as directed   Former smoker - Referring to Lung cancer screening program    Martyn Ehrich, NP 09/20/2021

## 2021-09-20 NOTE — Assessment & Plan Note (Addendum)
-   Continue supplemental oxygen 2-4L to maintain >90%  - DME order placed to add humidification to oxygen

## 2021-09-20 NOTE — Assessment & Plan Note (Signed)
-   Continue Zyrtec and Flonase as directed

## 2021-09-20 NOTE — Assessment & Plan Note (Signed)
-   Referring to Lung cancer screening program

## 2021-10-18 ENCOUNTER — Telehealth: Payer: Self-pay | Admitting: Emergency Medicine

## 2021-10-19 ENCOUNTER — Other Ambulatory Visit: Payer: Self-pay | Admitting: *Deleted

## 2021-10-19 DIAGNOSIS — Z87891 Personal history of nicotine dependence: Secondary | ICD-10-CM

## 2021-10-31 NOTE — Telephone Encounter (Signed)
Looks like encounter was open in error so closing encounter. 

## 2021-11-18 ENCOUNTER — Other Ambulatory Visit: Payer: Self-pay | Admitting: Cardiology

## 2021-11-22 ENCOUNTER — Other Ambulatory Visit: Payer: Self-pay

## 2021-11-22 ENCOUNTER — Telehealth (INDEPENDENT_AMBULATORY_CARE_PROVIDER_SITE_OTHER): Payer: Medicaid Other | Admitting: Acute Care

## 2021-11-22 ENCOUNTER — Encounter: Payer: Self-pay | Admitting: Acute Care

## 2021-11-22 DIAGNOSIS — Z87891 Personal history of nicotine dependence: Secondary | ICD-10-CM

## 2021-11-22 DIAGNOSIS — Z122 Encounter for screening for malignant neoplasm of respiratory organs: Secondary | ICD-10-CM

## 2021-11-22 NOTE — Patient Instructions (Signed)
Thank you for participating in the [facility] Screening Program. °It was our pleasure to meet you today. °We will call you with the results of your scan within the next few days. °Your scan will be assigned a Lung RADS category score by the physicians reading the scans.  °This Lung RADS score determines follow up scanning.  °See below for description of categories, and follow up screening recommendations. °We will be in touch to schedule your follow up screening annually or based on recommendations of our providers. °We will fax a copy of your scan results to your Primary Care Physician, or the physician who referred you to the program, to ensure they have the results. °Please call the office if you have any questions or concerns regarding your scanning experience or results.  °Our office number is [phone]. °Please speak with Denise Phelps, RN. She is our Lung Cancer Screening RN. °If she is unavailable when you call, please have the office staff send her a message. She will return your call at her earliest convenience. °Remember, if your scan is normal, we will scan you annually as long as you continue to meet the criteria for the program. (Age 55-77, Current smoker or smoker who has quit within the last 15 years). °If you are a smoker, remember, quitting is the single most powerful action that you can take to decrease your risk of lung cancer and other pulmonary, breathing related problems. °We know quitting is hard, and we are here to help.  °Please let us know if there is anything we can do to help you meet your goal of quitting. °If you are a former smoker, congratulations. We are proud of you! Remain smoke free! °Remember you can refer friends or family members through the number above.  °We will screen them to make sure they meet criteria for the program. °Thank you for helping us take better care of you by participating in Lung Screening. ° °You can receive free nicotine replacement therapy  ( patches, gum or mints) by calling 1-800-QUIT NOW. Please call so we can get you on the path to becoming  a non-smoker. I know it is hard, but you can do this! ° °Lung RADS Categories: ° °Lung RADS 1: no nodules or definitely non-concerning nodules.  °Recommendation is for a repeat annual scan in 12 months. ° °Lung RADS 2:  nodules that are non-concerning in appearance and behavior with a very low likelihood of becoming an active cancer. °Recommendation is for a repeat annual scan in 12 months. ° °Lung RADS 3: nodules that are probably non-concerning , includes nodules with a low likelihood of becoming an active cancer.  Recommendation is for a 6-month repeat screening scan. Often noted after an upper respiratory illness. We will be in touch to make sure you have no questions, and to schedule your 6-month scan. ° °Lung RADS 4 A: nodules with concerning findings, recommendation is most often for a follow up scan in 3 months or additional testing based on our provider's assessment of the scan. We will be in touch to make sure you have no questions and to schedule the recommended 3 month follow up scan. ° °Lung RADS 4 B:  indicates findings that are concerning. We will be in touch with you to schedule additional diagnostic testing based on our provider's  assessment of the scan. ° °Hypnosis for smoking cessation  °[employer] °[phone] ° °Acupuncture for smoking cessation  °[facility] °[phone]  °

## 2021-11-22 NOTE — Progress Notes (Signed)
Virtual Visit via Video Note  I connected with Stephanie Schultz on 11/22/21 at 10:30 AM EST by a video enabled telemedicine application and verified that I am speaking with the correct person using two identifiers.  Location: Patient: At home Provider:  [place], [place], [place], [address]    I discussed the limitations of evaluation and management by telemedicine and the availability of in person appointments. The patient expressed understanding and agreed to proceed.  This was scheduled as a video visit, we connected and I could see the patient , but She could not hear me, ( My microphone was enabled) so we were on the phone with the video connection so I could see the patient and she could see me.   Shared Decision Making Visit Lung Cancer Screening Program [phone])   Eligibility: Age 66 y.o. Pack Years Smoking History Calculation 76 pack year smoking history (# packs/per year x # years smoked) Recent History of coughing up blood  no Unexplained weight loss? no ( >Than 15 pounds within the last 6 months ) Prior History Lung / other cancer no (Diagnosis within the last 5 years already requiring surveillance chest CT Scans). Smoking Status Former Smoker Former Smokers: Years since quit: 7 years  Quit Date: 11/11/2015  Visit Components: Discussion included one or more decision making aids. yes Discussion included risk/benefits of screening. yes Discussion included potential follow up diagnostic testing for abnormal scans. yes Discussion included meaning and risk of over diagnosis. yes Discussion included meaning and risk of False Positives. yes Discussion included meaning of total radiation exposure. yes  Counseling Included: Importance of adherence to annual lung cancer LDCT screening. yes Impact of comorbidities on ability to participate in the program. yes Ability and willingness to under diagnostic treatment. yes  Smoking Cessation Counseling: Current  Smokers:  Discussed importance of smoking cessation. yes Information about tobacco cessation classes and interventions provided to patient. yes Patient provided with "ticket" for LDCT Scan. yes Symptomatic Patient. no  Counseling NA Diagnosis Code: Tobacco Use Z72.0 Asymptomatic Patient yes  Counseling (Intermediate counseling: > three minutes counseling) [id number] Former Smokers:  Discussed the importance of maintaining cigarette abstinence. yes Diagnosis Code: Personal History of Nicotine Dependence. S06.301 Information about tobacco cessation classes and interventions provided to patient. Yes Patient provided with "ticket" for LDCT Scan. yes Written Order for Lung Cancer Screening with LDCT placed in Epic. Yes (CT Chest Lung Cancer Screening Low Dose W/O CM) [id number] Z12.2-Screening of respiratory organs Z87.891-Personal history of nicotine dependence  I spent 25 minutes of face to face time/virtual visit time  with  Stephanie Schultz discussing the risks and benefits of lung cancer screening. We took the time to pause the power point at intervals to allow for questions to be asked and answered to ensure understanding. We discussed that she had taken the single most powerful action possible to decrease her risk of developing lung cancer when she quit smoking. I counseled her to remain smoke free, and to contact me if she ever had the desire to smoke again so that I can provide resources and tools to help support the effort to remain smoke free. We discussed the time and location of the scan, and that either  Stephanie Glassman RN, Joella Prince, RN or I  or I will call / send a letter with the results within  24-72 hours of receiving them. She has the office contact information in the event she needs to speak with me,  she verbalized understanding  of all of the above and had no further questions upon leaving the office.     I explained to the patient that there has been a high incidence of coronary artery  disease noted on these exams. I explained that this is a non-gated exam therefore degree or severity cannot be determined. This patient is not on statin therapy. I have asked the patient to follow-up with their PCP regarding any incidental finding of coronary artery disease and management with diet or medication as they feel is clinically indicated. The patient verbalized understanding of the above and had no further questions.     Magdalen Spatz, NP 11/22/2021

## 2021-11-23 ENCOUNTER — Ambulatory Visit
Admission: RE | Admit: 2021-11-23 | Discharge: 2021-11-23 | Disposition: A | Discharge status: Home / Self Care | Payer: Medicaid Other | Source: Ambulatory Visit | Attending: Acute Care | Admitting: Acute Care

## 2021-11-23 DIAGNOSIS — Z87891 Personal history of nicotine dependence: Secondary | ICD-10-CM

## 2021-11-24 ENCOUNTER — Other Ambulatory Visit: Payer: Self-pay | Admitting: Acute Care

## 2021-11-24 DIAGNOSIS — Z87891 Personal history of nicotine dependence: Secondary | ICD-10-CM

## 2021-12-13 ENCOUNTER — Other Ambulatory Visit: Payer: Self-pay | Admitting: Emergency Medicine

## 2021-12-18 ENCOUNTER — Telehealth: Payer: Self-pay | Admitting: Emergency Medicine

## 2021-12-18 ENCOUNTER — Other Ambulatory Visit: Payer: Self-pay | Admitting: Emergency Medicine

## 2021-12-18 NOTE — Telephone Encounter (Signed)
Spoke with the pt  She is c/o increased cough, wheezing, congestion for approx 6 wks  She moved and is unable to come in for appt in person  Video visit with [facility] scheduled for tomorrow at 9 am

## 2021-12-19 ENCOUNTER — Telehealth (INDEPENDENT_AMBULATORY_CARE_PROVIDER_SITE_OTHER): Payer: Medicaid Other | Admitting: Primary Care

## 2021-12-19 ENCOUNTER — Other Ambulatory Visit: Payer: Self-pay

## 2021-12-19 DIAGNOSIS — J441 Chronic obstructive pulmonary disease with (acute) exacerbation: Secondary | ICD-10-CM | POA: Diagnosis not present

## 2021-12-19 MED ORDER — DOXYCYCLINE HYCLATE 100 MG PO TABS: 100.0000 mg | ORAL_TABLET | Freq: Two times a day (BID) | ORAL | 0 refills | Status: DC

## 2021-12-19 MED ORDER — PREDNISONE 10 MG PO TABS: ORAL_TABLET | ORAL | 0 refills | Status: DC

## 2021-12-19 NOTE — Patient Instructions (Signed)
Start mucinex 600mg  twice daily Continue Trelegy 152mcg one puff daily Use albuterol every 6 hours for breakthrough symptoms Continue Daliresp 527mcg daily Use flutter valve three times daily Start doxycycline and prednisone taper as directed Follow-up in 1 week or sooner if symptom worsen

## 2021-12-19 NOTE — Progress Notes (Signed)
Virtual Visit via Video Note  I connected with Stephanie Schultz on 12/19/21 at  9:30 AM EST by a video enabled telemedicine application and verified that I am speaking with the correct person using two identifiers.  Location: Patient: Home Provider: Office    I discussed the limitations of evaluation and management by telemedicine and the availability of in person appointments. The patient expressed understanding and agreed to proceed.  History of Present Illness: 66 year old female, former smoker quit in 2016 (108-pack-year history).  Significant for COPD component, chronic cough, chronic respiratory failure O2 dependent, pulmonary nodule, allergic rhinitis, chronic diastolic heart failure, hypertension, GERD, type 2 diabetes, obesity.  Patient of [name]. Maintained on Trelegy, Daliresp, prn albuterol and continuous oxygen at 3-4L/min.  Previous [facility] pulmonary encounters: 08/24/20- ROV, [name]  Follow-up visit for 66 year old woman with very severe COPD with asthmatic features.  Also with superimposed upper airway irritation syndrome and chronic cough.  Followed for hypertension and diastolic dysfunction, obesity.  She has chronic hypoxemia on 2 to 3 L/min with documented nocturnal hypoxemia, no OSA. She reports today that she is doing pretty well. She is on daliresp for the last 2 months, on Trelegy. Has albuterol that she uses . She is on flonase, zyrtec. On esomeprazole. Her last flare was over 6 months ago. COVID vaccine up to date, Flu up to date. Needs PNA shot after age 53 PFT done today reviewed by me show very severe obstruction. Restricted volumes, decreased diffusion capacity.   She needs 6 more green e-tanks for her O2 from [employer].   03/14/2021  Patient presents today for 6 month follow-up COPD.  Her breathing has more some worse over the last two week with np cough. Feels it is related to seasonal allergies. She is using flonase nasal spray daily. Asking to try tesslon  perles. She has not had a recent exacerbation, prior to being on Daliresp she was experiencing frequent flare ups. Maintained on Trelegy 100, prn albuterol and Daliresp. She is on oxygen using 2-4L. She has lost 30 lbs, she has been eating salads and is exercising with bicycle / weights. She states that she can do more with her daughter/grandchildren and can walker longer distances. She is off all her diabetes medications.   09/20/2021 Patient presents today for 6 month follow-up. She is doing very well. She has had significantly less amount of exacerbations his past year since starting Daliresp. She was hospitalized overnight at [facility] hospital in September for COPD. She is doing well today. She has no significant shortness of breath. She keeps a chronic cough, tessalon perles help. She is compliant with Trelegy and daliresp. She uses albuterol nebulizer morning and evening. She is on 2.5-3L oxygen at rest and 4L with exertion. No increased O2 demand.  Dyspnea: None  Cough: Chronic cough  SABA: Use twice a day scheduled  Oxygen: 2.5-4L Nocturnal: 1-2 pillows   12/19/2021- Interim hx  Patient contacted today for acute virtual/video visit. Following with our office for hx COPD and chronic respiratory failure. Patient reports symptoms of cough, shortness of breath and chest tightness over x 6 weeks. Breathing is worse at night. Cough is productive with green-yellow mucus. She is maintained on Trelegy 155mcg, Daliresp, prn albuterol and continuous oxygen at 3-4L/min. She was last hospitalized in September for COPD exacerbation.    Recent COPD exacerbations: January 19th, 2021-doxycycline, prednisone taper Mar 15, 2020- Z-Pak April 18, 2020- Z-Pak, prednisone taper May 17, 2020- Doxycycline, prednisone taper  03/14/2021 - Increased  Trelegy 284mcg x 2 weeks/ holding off on oral steriods and no indication for abx at this time  05/01/21- Called in with sob/cough and given zpack and prednisone  07/2021-  Hospitalized overnight for COPD exacerbation 12/19/2021- Doxy/prednisone taper    Observations/Objective:  - Appears well; No visible respiratory distress. Dyspnea when hurrying. Wearing 3L oxygen.  TEST/EVENTS :  Cardiac: > 08/23/2017-echocardiogram-LV ejection fraction 60 to 65% > 06/01/20- echocardiogram- LV ejection fraction 60-65%, mild concentric left ventricular hypertrophy, normal diastolic parameters    Pulmonary function testing: > 11/16/2013 PFTs- FVC 1.58 (67% predicted), postbronchodilator ratio 60, postbronchodilator FEV1 1.00 (53% predicted), no significant bronchodilator response, mid flow reversibility after administration of bronchodilator, DLCO 39 > 01/12/19 PFTs-FVC 1.18 (52%), FEV1 0.66 (37%), ratio 56, borderline bronchodilator response, DLCO correlated 6.07 (34%)   Imaging: > October 2020 CT chest - Neg for PE, +emphysema with no acute process. > 05/11/20 CTA- No PE, Chronic emphysematous changes in the upper lobes. Chronic linear scarring at the lung bases, left more than right. No effusions or acute infiltrates.    Assessment and Plan:  COPD exacerbation:  - Patient has had worsening productive cough, sob and chest tightness for last several weeks/ She is compliant with Trelegy 128mcg and Dailresp 517mcg daily. Last exacerbation was in September 2022. Sending in RX Doxycycline 100mg  twice daily x 7 days and prednisone taper 40mg  x 3 days. 30mg  x 3 days, 20mg  x 3 days, 10mg  x 3 days. Advised she take mucinex 600mg  twice daily for congestion and use flutter valve   Chronic respiratory failure: - Maintained on 3-4L supplemental oxygen, no new O2 requirements   GERD: - Continue Nexium 40mg  daily   Former smoker: - Quit in 2016, 108 pack year hx - Following with lung cancer screening program, LDCT on 11/23/21 showed lung RADS 2  Follow Up Instructions:  - 1 week follow-up in office with Beth NP    I discussed the assessment and treatment plan with the patient.  The patient was provided an opportunity to ask questions and all were answered. The patient agreed with the plan and demonstrated an understanding of the instructions.   The patient was advised to call back or seek an in-person evaluation if the symptoms worsen or if the condition fails to improve as anticipated.  I provided 22 minutes of non-face-to-face time during this encounter.   Martyn Ehrich, NP

## 2021-12-21 ENCOUNTER — Other Ambulatory Visit: Payer: Self-pay | Admitting: Emergency Medicine

## 2021-12-22 ENCOUNTER — Other Ambulatory Visit: Payer: Self-pay | Admitting: Emergency Medicine

## 2021-12-28 ENCOUNTER — Ambulatory Visit (INDEPENDENT_AMBULATORY_CARE_PROVIDER_SITE_OTHER): Payer: Medicaid Other

## 2021-12-28 ENCOUNTER — Encounter: Payer: Self-pay | Admitting: Primary Care

## 2021-12-28 ENCOUNTER — Ambulatory Visit (INDEPENDENT_AMBULATORY_CARE_PROVIDER_SITE_OTHER): Payer: Medicaid Other | Admitting: Primary Care

## 2021-12-28 ENCOUNTER — Other Ambulatory Visit: Payer: Self-pay

## 2021-12-28 VITALS — BP 120/60 | HR 82 | Temp 98.0°F | Ht 60.0 in | Wt 191.2 lb

## 2021-12-28 DIAGNOSIS — J309 Allergic rhinitis, unspecified: Secondary | ICD-10-CM | POA: Diagnosis not present

## 2021-12-28 DIAGNOSIS — K219 Gastro-esophageal reflux disease without esophagitis: Secondary | ICD-10-CM | POA: Diagnosis not present

## 2021-12-28 DIAGNOSIS — J441 Chronic obstructive pulmonary disease with (acute) exacerbation: Secondary | ICD-10-CM

## 2021-12-28 DIAGNOSIS — J9611 Chronic respiratory failure with hypoxia: Secondary | ICD-10-CM

## 2021-12-28 MED ORDER — ALBUTEROL SULFATE (2.5 MG/3ML) 0.083% IN NEBU: INHALATION_SOLUTION | RESPIRATORY_TRACT | 11 refills | Status: DC

## 2021-12-28 MED ORDER — GUAIFENESIN ER 600 MG PO TB12: 600.0000 mg | ORAL_TABLET | Freq: Two times a day (BID) | ORAL | 1 refills | Status: DC | PRN

## 2021-12-28 MED ORDER — TRELEGY ELLIPTA 200-62.5-25 MCG/ACT IN AEPB: 1.0000 | INHALATION_SPRAY | Freq: Every day | RESPIRATORY_TRACT | 1 refills | Status: DC

## 2021-12-28 MED ORDER — ALBUTEROL SULFATE HFA 108 (90 BASE) MCG/ACT IN AERS: 2.0000 | INHALATION_SPRAY | RESPIRATORY_TRACT | 3 refills | Status: DC | PRN

## 2021-12-28 NOTE — Progress Notes (Signed)
@Patient  ID: Stephanie Schultz, female    DOB: 01/27/1956, 66 y.o.   MRN: [id number]  Chief Complaint  Patient presents with   Follow-up    Cough for about 6 weeks, mucous has a yellow tint to it.  SOB with exertion.    Referring provider: [name], MD  HPI: 66 year old female, former smoker quit in 2016 (108-pack-year history).  Significant for COPD component, chronic cough, chronic respiratory failure O2 dependent, pulmonary nodule, allergic rhinitis, chronic diastolic heart failure, hypertension, GERD, type 2 diabetes, obesity.  Patient of [name]. Maintained on Trelegy, Daliresp, prn albuterol and continuous oxygen at 3-4L/min.  Previous [facility] pulmonary encounters: 08/24/20- ROV, [name]  Follow-up visit for 66 year old woman with very severe COPD with asthmatic features.  Also with superimposed upper airway irritation syndrome and chronic cough.  Followed for hypertension and diastolic dysfunction, obesity.  She has chronic hypoxemia on 2 to 3 L/min with documented nocturnal hypoxemia, no OSA. She reports today that she is doing pretty well. She is on daliresp for the last 2 months, on Trelegy. Has albuterol that she uses . She is on flonase, zyrtec. On esomeprazole. Her last flare was over 6 months ago. COVID vaccine up to date, Flu up to date. Needs PNA shot after age 73 PFT done today reviewed by me show very severe obstruction. Restricted volumes, decreased diffusion capacity.   She needs 6 more green e-tanks for her O2 from [employer].   03/14/2021  Patient presents today for 6 month follow-up COPD.  Her breathing has more some worse over the last two week with np cough. Feels it is related to seasonal allergies. She is using flonase nasal spray daily. Asking to try tesslon perles. She has not had a recent exacerbation, prior to being on Daliresp she was experiencing frequent flare ups. Maintained on Trelegy 100, prn albuterol and Daliresp. She is on oxygen using 2-4L. She  has lost 30 lbs, she has been eating salads and is exercising with bicycle / weights. She states that she can do more with her daughter/grandchildren and can walker longer distances. She is off all her diabetes medications.   09/20/2021 Patient presents today for 6 month follow-up. She is doing very well. She has had significantly less amount of exacerbations his past year since starting Daliresp. She was hospitalized overnight at [facility] hospital in September for COPD. She is doing well today. She has no significant shortness of breath. She keeps a chronic cough, tessalon perles help. She is compliant with Trelegy and daliresp. She uses albuterol nebulizer morning and evening. She is on 2.5-3L oxygen at rest and 4L with exertion. No increased O2 demand.  Dyspnea: None  Cough: Chronic cough  SABA: Use twice a day scheduled  Oxygen: 2.5-4L Nocturnal: 1-2 pillows   12/19/2021 Patient contacted today for acute virtual/video visit. Following with our office for hx COPD and chronic respiratory failure. Patient reports symptoms of cough, shortness of breath and chest tightness over x 6 weeks. Breathing is worse at night. Cough is productive with green-yellow mucus. She is maintained on Trelegy 13mcg, Daliresp, prn albuterol and continuous oxygen at 3-4L/min. She was last hospitalized in September for COPD exacerbation.    12/28/2021 - Interim hx  Patient presents today for 6 week follow-up. She was treated for acute exacerbation last week with Doxycycline and prednisone taper without significant improvement. She still has cough with yellow mucus along with chest tightness, nasal congestion and ear fullness. She has not tried taking mucinex  and is not currently using Flonase. She is taking Trelegy 186mcg and Daliresp as prescribed. She is using her albuterol 4-5 times a day. On 3L oxygen 24/7.   Recent COPD exacerbations: January 19th, 2021-doxycycline, prednisone taper Mar 15, 2020- Z-Pak April 18, 2020-  Z-Pak, prednisone taper May 17, 2020- Doxycycline, prednisone taper  03/14/2021 - Increased Trelegy 252mcg x 2 weeks/ holding off on oral steriods and no indication for abx at this time  05/01/21- Called in with sob/cough and given zpack and prednisone  07/2021- Hospitalized overnight for COPD exacerbation 12/19/2021- Doxy/prednisone taper   TEST/EVENTS :  Cardiac: > 08/23/2017-echocardiogram-LV ejection fraction 60 to 65% > 06/01/20- echocardiogram- LV ejection fraction 60-65%, mild concentric left ventricular hypertrophy, normal diastolic parameters    Pulmonary function testing: > 11/16/2013 PFTs- FVC 1.58 (67% predicted), postbronchodilator ratio 60, postbronchodilator FEV1 1.00 (53% predicted), no significant bronchodilator response, mid flow reversibility after administration of bronchodilator, DLCO 39 > 01/12/19 PFTs-FVC 1.18 (52%), FEV1 0.66 (37%), ratio 56, borderline bronchodilator response, DLCO correlated 6.07 (34%)   Imaging: > October 2020 CT chest - Neg for PE, +emphysema with no acute process. > 05/11/20 CTA- No PE, Chronic emphysematous changes in the upper lobes. Chronic linear scarring at the lung bases, left more than right. No effusions or acute infiltrates.     Allergies  Allergen Reactions   Acetaminophen Other (See Comments)    Pt was told that she could not take Tylenol, does not know why.    Immunization History  Administered Date(s) Administered   Influenza Split 09/20/2011, 09/12/2012, 08/12/2013   Influenza,inj,Quad PF,6+ Mos 08/03/2014, 08/12/2015, 08/12/2017, 07/29/2018, 09/11/2021   Influenza,inj,Quad PF,6-35 Mos 09/11/2019   PFIZER(Purple Top)SARS-COV-2 Vaccination 02/05/2020, 02/25/2020, 11/08/2020   Pneumococcal Conjugate-13 09/24/2017   Pneumococcal Polysaccharide-23 11/12/2009    Past Medical History:  Diagnosis Date   Anemia    chronis   Anxiety    Arthritis    Cervical cancer ([place]) 1990   cervical    Chronic diastolic CHF (congestive heart  failure) ([place]) 08/10/2016   COPD (chronic obstructive pulmonary disease) ([place])    O2 dependent. Pulmo: [name]   Diabetes mellitus without complication ([place])    a. A1c 8.3 in 11/2015   Emphysema    Gallstones    s/p cholecystectomy   GERD (gastroesophageal reflux disease)    Gout    History of home oxygen therapy    "2.5L; 24/7" (08/10/2016)   Hx of cardiac catheterization    a. LHC at [facility] in [place], [place] 09/2008:  Normal coronary arteries EF 70%. b. LHC 11/2016: normal cors, normal LVEDP, EF 55-65%.   Hyperlipidemia    Hypertension    Hypoxia 11/17/2018   Morbid obesity (HCC)    OSA on CPAP    CPAP at night    Pneumonia    Sickle cell trait ([place])    Tobacco abuse    a. up to 3ppd from age 82 to 89, now 1/4 ppd (01/2013) >> Quit 10/2015    Tobacco History: Social History   Tobacco Use  Smoking Status Former   Packs/day: 3.00   Years: 36.00   Pack years: 108.00   Types: Cigarettes   Quit date: 11/11/2015   Years since quitting: 6.1  Smokeless Tobacco Never  Tobacco Comments   Approx 90 pk-yrs (up to 3ppd until ~ 2009).   Counseling given: Not Answered Tobacco comments: Approx 90 pk-yrs (up to 3ppd until ~ 2009).   Outpatient Medications Prior to Visit  Medication Sig Dispense Refill  allopurinol (ZYLOPRIM) 100 MG tablet Take 100 mg by mouth daily.     ALPRAZolam (XANAX) 1 MG tablet Take 1 mg by mouth at bedtime as needed for sleep.   0   aspirin EC 81 MG tablet Take 81 mg by mouth daily.     benzonatate (TESSALON) 100 MG capsule Take 2 capsules (200 mg total) by mouth 3 (three) times daily as needed for cough. 60 capsule 1   cetirizine (ZYRTEC) 10 MG tablet Take 10 mg by mouth daily.     Cholecalciferol (VITAMIN D PO) Take 1 tablet by mouth daily.     clobetasol cream (TEMOVATE) 6.25 % Apply 1 application topically daily as needed (irritation from pull ups).   0   DALIRESP 500 MCG TABS tablet TAKE 1 TABLET(500 MCG) BY MOUTH DAILY 30 tablet 11   doxycycline  (VIBRA-TABS) 100 MG tablet Take 1 tablet (100 mg total) by mouth 2 (two) times daily. 14 tablet 0   esomeprazole (NEXIUM) 40 MG capsule Take 40 mg by mouth daily at 12 noon.     fluticasone (FLONASE) 50 MCG/ACT nasal spray Place 1 spray into both nostrils daily as needed (congestion).     furosemide (LASIX) 40 MG tablet TAKE ONE TABLET EVERY MORNING and TAKE 1/2 TABLET EVERY EVENING 45 tablet 8   gabapentin (NEURONTIN) 300 MG capsule Take 300 mg by mouth 3 (three) times daily.     HYDROcodone-acetaminophen (NORCO) 7.5-325 MG tablet Take 1 tablet by mouth every 6 (six) hours as needed for moderate pain.     linaclotide (LINZESS) 145 MCG CAPS capsule Take 145 mcg by mouth daily.     meclizine (ANTIVERT) 25 MG tablet Take 25 mg by mouth every 6 (six) hours as needed.     metoprolol succinate (TOPROL-XL) 50 MG 24 hr tablet TAKE ONE TABLET EVERY MORNING 30 tablet 9   OXYGEN Inhale 2.5 L into the lungs continuous.      PAZEO 0.7 % SOLN Place 1 drop into both eyes at bedtime.     potassium chloride SA (KLOR-CON) 20 MEQ tablet TAKE ONE TABLET EVERY MORNING 30 tablet 9   predniSONE (DELTASONE) 10 MG tablet 4 tabs for 3 days, then 3 tabs for 3 days, 2 tabs for 3 days, then 1 tab for 3 days, then stop 30 tablet 0   Respiratory Therapy Supplies (FLUTTER) DEVI 1 Device by Does not apply route 3 (three) times daily. 1 each 0   spironolactone (ALDACTONE) 25 MG tablet Take 0.5 tablets (12.5 mg total) by mouth daily. 15 tablet 12   zolpidem (AMBIEN) 10 MG tablet Take 10 mg by mouth at bedtime.     albuterol (PROVENTIL) (2.5 MG/3ML) 0.083% nebulizer solution USE 1 VIAL VIA NEBULIZER 4 TIMES DAILY AS NEEDED. 300 mL 11   albuterol (VENTOLIN HFA) 108 (90 Base) MCG/ACT inhaler Inhale 2 puffs into the lungs every 4 (four) hours as needed for wheezing or shortness of breath.     Fluticasone-Umeclidin-Vilant (TRELEGY ELLIPTA) 100-62.5-25 MCG/INH AEPB INHALE 1 PUFF INTO THE LUNGS ONCE DAILY AS DIRECTED. 60 each 5   TRELEGY  ELLIPTA 100-62.5-25 MCG/ACT AEPB INHALE 1 PUFF BY MOUTH EVERY DAY AS DIRECTED 60 each 5   Fluticasone-Umeclidin-Vilant (TRELEGY ELLIPTA) 100-62.5-25 MCG/INH AEPB Inhale 1 puff into the lungs daily. (Patient not taking: Reported on 12/28/2021) 1 each 0   No facility-administered medications prior to visit.      Review of Systems  Review of Systems  Constitutional: Negative.   HENT:  Positive  for congestion.   Respiratory:  Positive for cough and wheezing.     Physical Exam  BP 120/60 (BP Location: Left Arm, Patient Position: Sitting, Cuff Size: Large)    Pulse 82    Temp 98 F (36.7 C) (Oral)    Ht 5' (1.524 m)    Wt 191 lb 3.2 oz (86.7 kg)    LMP 06/04/2000 (LMP Unknown) Comment: tubal ligation   SpO2 98%    BMI 37.34 kg/m  Physical Exam Constitutional:      General: She is not in acute distress.    Appearance: Normal appearance. She is obese. She is not ill-appearing.  HENT:     Head: Normocephalic and atraumatic.  Cardiovascular:     Rate and Rhythm: Normal rate.  Pulmonary:     Effort: Pulmonary effort is normal.     Breath sounds: Rhonchi present.  Neurological:     General: No focal deficit present.     Mental Status: She is alert and oriented to person, place, and time. Mental status is at baseline.  Psychiatric:        Mood and Affect: Mood normal.        Behavior: Behavior normal.        Thought Content: Thought content normal.        Judgment: Judgment normal.     Lab Results:  CBC    Component Value Date/Time   WBC 5.7 05/10/2020 1004   WBC 6.2 08/31/2019 1800   RBC 4.30 05/10/2020 1004   RBC 4.07 08/31/2019 1800   HGB 12.3 05/10/2020 1004   HCT 36.9 05/10/2020 1004   PLT 193 05/10/2020 1004   MCV 86 05/10/2020 1004   MCH 28.6 05/10/2020 1004   MCH 28.3 08/31/2019 1800   MCHC 33.3 05/10/2020 1004   MCHC 33.1 08/31/2019 1800   RDW 15.6 (H) 05/10/2020 1004   LYMPHSABS 1.8 08/31/2019 1800   MONOABS 0.6 08/31/2019 1800   EOSABS 0.3 08/31/2019 1800    BASOSABS 0.0 08/31/2019 1800    BMET    Component Value Date/Time   NA 143 05/10/2020 1004   K 4.2 05/10/2020 1004   CL 100 05/10/2020 1004   CO2 34 (H) 05/10/2020 1004   GLUCOSE 162 (H) 05/10/2020 1004   GLUCOSE 127 (H) 08/31/2019 1800   BUN 14 05/10/2020 1004   CREATININE 1.02 (H) 05/10/2020 1004   CALCIUM 9.9 05/10/2020 1004   GFRNONAA 60 05/10/2020 1004   GFRAA 69 05/10/2020 1004    BNP    Component Value Date/Time   BNP 11.3 08/31/2019 1800    ProBNP    Component Value Date/Time   PROBNP 10 05/10/2020 1004   PROBNP 16.1 07/05/2014 1230    Imaging: DG Chest 2 View  Result Date: 12/28/2021 CLINICAL DATA:  COPD exacerbation EXAM: CHEST - 2 VIEW COMPARISON:  None. FINDINGS: The heart size and mediastinal contours are within normal limits. Minimal diffuse bilateral interstitial pulmonary opacity. Disc degenerative disease of the thoracic spine. IMPRESSION: Minimal diffuse bilateral interstitial pulmonary opacity, suggestive of minimal edema or atypical/viral infection. No focal airspace opacity. Electronically Signed   By: Delanna Ahmadi M.D.   On: 12/28/2021 13:01     Assessment & Plan:   COPD with acute exacerbation (HCC) - Prolonged exacerbation. No improvement with recent course Doxycycline and prednisone taper. Using SABA 4-5 times a day. Plan increased Trelegy 242mcg one puff daily. Checking CXR and sputum culture. Continue Daliresp 598mcg. Start mucinex 600mg  twice daily as needed for  congestion and use flutter valve three times a day. FU in 2 weeks or sooner if needed.  Chronic allergic rhinitis - Start flonase nasal spray 1 puff per nostril daily  Chronic respiratory failure with hypoxia (HCC) - Stable; Continue 3-4L supplemental oxygen to maintained O2 >88-90%   GERD (gastroesophageal reflux disease) - Continue Nexium 40mg  daily    Martyn Ehrich, NP 01/10/2022

## 2021-12-28 NOTE — Patient Instructions (Addendum)
COPD: - Increase Trelegy 230mcg one puff daily (no samples available, sending in RX) - Continue Daliresp as directed  - Start mucinex 600mg  twice daily as needed for congestion  - Start flonase nasal spray  - Use flutter valve three times a day   Chronic respiratory failure: - Continue 3-4L supplemental oxygen to maintained O2 >88-90%   GERD: - Continue Nexium 40mg  daily   Orders: - CXR and respiratory sputum culture   Follow-up: - 2 week follow-up with Eustaquio Maize NP

## 2021-12-29 NOTE — Progress Notes (Signed)
Please let patient know CXR showed mild bilateral pulmonary opacity, suggestive of atypical or viral infection. Continue with all my recommendations including mucinex and flutter valve. Recommend we wait to get sputum sample back to see if grows any bacteria. Call if she develop fever or worsening mucus production

## 2022-01-01 ENCOUNTER — Other Ambulatory Visit (HOSPITAL_COMMUNITY): Payer: Self-pay

## 2022-01-01 ENCOUNTER — Telehealth: Payer: Self-pay

## 2022-01-01 NOTE — Telephone Encounter (Signed)
Patient Advocate Encounter   Received notification from [place] that prior authorization for Trelegy Cleaster Corin is required by his/her insurance [place] [employer].   PA submitted on 01/01/22  [facility] Tracks #: [id number]  Status is pending    [facility] will continue to follow:  Patient Advocate Fax: [phone]

## 2022-01-01 NOTE — Telephone Encounter (Signed)
Patient Advocate Encounter  Prior Authorization for Trelegy Stephanie Schultz has been approved.    [facility] Tracks #: [id number]  Effective dates: 01/01/22 through 06/30/22  Spoke with Pharmacy to Process.  Patient Advocate Fax: [phone]

## 2022-01-10 NOTE — Assessment & Plan Note (Signed)
-   Start flonase nasal spray 1 puff per nostril daily ?

## 2022-01-10 NOTE — Assessment & Plan Note (Signed)
-   Stable; Continue 3-4L supplemental oxygen to maintained O2 >88-90%  ?

## 2022-01-10 NOTE — Progress Notes (Signed)
@Patient  ID: Stephanie Schultz, female    DOB: 1956-08-15, 66 y.o.   MRN: [id number]  Chief Complaint  Patient presents with   Follow-up    Feeling the same, sob-same, cough-green,yellow, occass. wheezing    Referring provider: [name], MD  HPI: 66 year old female, former smoker quit in 2016 (108-pack-year history).  Significant for COPD component, chronic cough, chronic respiratory failure O2 dependent, pulmonary nodule, allergic rhinitis, chronic diastolic heart failure, hypertension, GERD, type 2 diabetes, obesity.  Patient of [name]. Maintained on Trelegy, Daliresp, prn albuterol and continuous oxygen at 3-4L/min.  Previous [facility] pulmonary encounters: 08/24/20- ROV, [name]  Follow-up visit for 66 year old woman with very severe COPD with asthmatic features.  Also with superimposed upper airway irritation syndrome and chronic cough.  Followed for hypertension and diastolic dysfunction, obesity.  She has chronic hypoxemia on 2 to 3 L/min with documented nocturnal hypoxemia, no OSA. She reports today that she is doing pretty well. She is on daliresp for the last 2 months, on Trelegy. Has albuterol that she uses . She is on flonase, zyrtec. On esomeprazole. Her last flare was over 6 months ago. COVID vaccine up to date, Flu up to date. Needs PNA shot after age 66 PFT done today reviewed by me show very severe obstruction. Restricted volumes, decreased diffusion capacity.   She needs 6 more green e-tanks for her O2 from [employer].   03/14/2021  Patient presents today for 6 month follow-up COPD.  Her breathing has more some worse over the last two week with np cough. Feels it is related to seasonal allergies. She is using flonase nasal spray daily. Asking to try tesslon perles. She has not had a recent exacerbation, prior to being on Daliresp she was experiencing frequent flare ups. Maintained on Trelegy 100, prn albuterol and Daliresp. She is on oxygen using 2-4L. She has lost 30  lbs, she has been eating salads and is exercising with bicycle / weights. She states that she can do more with her daughter/grandchildren and can walker longer distances. She is off all her diabetes medications.   09/20/2021 Patient presents today for 6 month follow-up. She is doing very well. She has had significantly less amount of exacerbations his past year since starting Daliresp. She was hospitalized overnight at [facility] hospital in September for COPD. She is doing well today. She has no significant shortness of breath. She keeps a chronic cough, tessalon perles help. She is compliant with Trelegy and daliresp. She uses albuterol nebulizer morning and evening. She is on 2.5-3L oxygen at rest and 4L with exertion. No increased O2 demand.  Dyspnea: None  Cough: Chronic cough  SABA: Use twice a day scheduled  Oxygen: 2.5-4L Nocturnal: 1-2 pillows   12/19/2021 Patient contacted today for acute virtual/video visit. Following with our office for hx COPD and chronic respiratory failure. Patient reports symptoms of cough, shortness of breath and chest tightness over x 6 weeks. Breathing is worse at night. Cough is productive with green-yellow mucus. She is maintained on Trelegy 16mcg, Daliresp, prn albuterol and continuous oxygen at 3-4L/min. She was last hospitalized in September for COPD exacerbation.   12/28/2021  Patient presents today for 6 week follow-up. She was treated for acute exacerbation last week with Doxycycline and prednisone taper without significant improvement. She still has cough with yellow mucus along with chest tightness, nasal congestion and ear fullness. She has not tried taking mucinex and is not currently using Flonase. She is taking Trelegy 181mcg and Daliresp  as prescribed. She is using her albuterol 4-5 times a day. On 3L oxygen 24/7.    01/11/2022- interim hx  Patient presents today for 2 week follow-up prolonged COPD exacerbation. During last visit we increased her Trelegy  to 244mcg. Ordered CXR and sputum culture. Advised she start mucinex and flutter valve.   She is doing some better. She completed course of doxycyline and prednisone in early march. She still has a cough and some chest tightness but states that it is not as bad as last visit. She has been taking mucinex and flutter valve. Cough is productive with yellow-green mucus, she has not yet been able to provide sputum sample. She was not able to get higher dose Trelegy d/t insurance. We will give her a sample today for her to try. She is on 3L oxygen at rest. Patient reports being inactive and basically home bound before starting on Daliresp. She is able to do much more and is pursing her GED.    Recent COPD exacerbations: January 19th, 2021-doxycycline, prednisone taper Mar 15, 2020- Z-Pak April 18, 2020- Z-Pak, prednisone taper May 17, 2020- Doxycycline, prednisone taper  03/14/2021 - Increased Trelegy 277mcg x 2 weeks/ holding off on oral steriods and no indication for abx at this time  05/01/21- Called in with sob/cough and given zpack and prednisone  07/2021- Hospitalized overnight for COPD exacerbation 12/19/2021- Doxy/prednisone taper  01/11/2022- Pred taper, sample Trelegy 231mcg (COPD exacerbation from Feb never completely resolved)   TEST/EVENTS :  Cardiac: > 08/23/2017-echocardiogram-LV ejection fraction 60 to 65% > 06/01/20- echocardiogram- LV ejection fraction 60-65%, mild concentric left ventricular hypertrophy, normal diastolic parameters    Pulmonary function testing: > 11/16/2013 PFTs- FVC 1.58 (67% predicted), postbronchodilator ratio 60, postbronchodilator FEV1 1.00 (53% predicted), no significant bronchodilator response, mid flow reversibility after administration of bronchodilator, DLCO 39 > 01/12/19 PFTs-FVC 1.18 (52%), FEV1 0.66 (37%), ratio 56, borderline bronchodilator response, DLCO correlated 6.07 (34%)   Imaging: > October 2020 CT chest - Neg for PE, +emphysema with no acute  process. > 05/11/20 CTA- No PE, Chronic emphysematous changes in the upper lobes. Chronic linear scarring at the lung bases, left more than right. No effusions or acute infiltrates.     Allergies  Allergen Reactions   Acetaminophen Other (See Comments)    Pt was told that she could not take Tylenol, does not know why.    Immunization History  Administered Date(s) Administered   Influenza Split 09/20/2011, 09/12/2012, 08/12/2013   Influenza,inj,Quad PF,6+ Mos 08/03/2014, 08/12/2015, 08/12/2017, 07/29/2018, 09/11/2021   Influenza,inj,Quad PF,6-35 Mos 09/11/2019   PFIZER(Purple Top)SARS-COV-2 Vaccination 02/05/2020, 02/25/2020, 11/08/2020   Pneumococcal Conjugate-13 09/24/2017   Pneumococcal Polysaccharide-23 11/12/2009    Past Medical History:  Diagnosis Date   Anemia    chronis   Anxiety    Arthritis    Cervical cancer ([place]) 1990   cervical    Chronic diastolic CHF (congestive heart failure) ([place]) 08/10/2016   COPD (chronic obstructive pulmonary disease) ([place])    O2 dependent. Pulmo: [name]   Diabetes mellitus without complication ([place])    a. A1c 8.3 in 11/2015   Emphysema    Gallstones    s/p cholecystectomy   GERD (gastroesophageal reflux disease)    Gout    History of home oxygen therapy    "2.5L; 24/7" (08/10/2016)   Hx of cardiac catheterization    a. LHC at [facility] in [place], [place] 09/2008:  Normal coronary arteries EF 70%. b. LHC 11/2016: normal cors, normal LVEDP, EF 55-65%.  Hyperlipidemia    Hypertension    Hypoxia 11/17/2018   Morbid obesity (HCC)    OSA on CPAP    CPAP at night    Pneumonia    Sickle cell trait ([place])    Tobacco abuse    a. up to 3ppd from age 75 to 49, now 1/4 ppd (01/2013) >> Quit 10/2015    Tobacco History: Social History   Tobacco Use  Smoking Status Former   Packs/day: 3.00   Years: 36.00   Pack years: 108.00   Types: Cigarettes   Quit date: 11/11/2015   Years since quitting: 6.1  Smokeless Tobacco Never  Tobacco Comments    Approx 90 pk-yrs (up to 3ppd until ~ 2009).   Counseling given: Not Answered Tobacco comments: Approx 90 pk-yrs (up to 3ppd until ~ 2009).   Outpatient Medications Prior to Visit  Medication Sig Dispense Refill   albuterol (PROVENTIL) (2.5 MG/3ML) 0.083% nebulizer solution USE 1 VIAL VIA NEBULIZER 4 TIMES DAILY AS NEEDED. 300 mL 11   albuterol (VENTOLIN HFA) 108 (90 Base) MCG/ACT inhaler Inhale 2 puffs into the lungs every 4 (four) hours as needed for wheezing or shortness of breath. 18 g 3   allopurinol (ZYLOPRIM) 100 MG tablet Take 100 mg by mouth daily.     ALPRAZolam (XANAX) 1 MG tablet Take 1 mg by mouth at bedtime as needed for sleep.   0   aspirin EC 81 MG tablet Take 81 mg by mouth daily.     benzonatate (TESSALON) 100 MG capsule Take 2 capsules (200 mg total) by mouth 3 (three) times daily as needed for cough. 60 capsule 1   cetirizine (ZYRTEC) 10 MG tablet Take 10 mg by mouth daily.     Cholecalciferol (VITAMIN D PO) Take 1 tablet by mouth daily.     clobetasol cream (TEMOVATE) 3.50 % Apply 1 application topically daily as needed (irritation from pull ups).   0   DALIRESP 500 MCG TABS tablet TAKE 1 TABLET(500 MCG) BY MOUTH DAILY 30 tablet 11   esomeprazole (NEXIUM) 40 MG capsule Take 40 mg by mouth daily at 12 noon.     fluticasone (FLONASE) 50 MCG/ACT nasal spray Place 1 spray into both nostrils daily as needed (congestion).     Fluticasone-Umeclidin-Vilant (TRELEGY ELLIPTA) 200-62.5-25 MCG/ACT AEPB Inhale 1 puff into the lungs daily. 28 each 1   furosemide (LASIX) 40 MG tablet TAKE ONE TABLET EVERY MORNING and TAKE 1/2 TABLET EVERY EVENING 45 tablet 8   gabapentin (NEURONTIN) 300 MG capsule Take 300 mg by mouth 3 (three) times daily.     guaiFENesin (MUCINEX) 600 MG 12 hr tablet Take 1 tablet (600 mg total) by mouth 2 (two) times daily as needed for to loosen phlegm. 30 tablet 1   HYDROcodone-acetaminophen (NORCO) 7.5-325 MG tablet Take 1 tablet by mouth every 6 (six) hours as  needed for moderate pain.     linaclotide (LINZESS) 145 MCG CAPS capsule Take 145 mcg by mouth daily.     meclizine (ANTIVERT) 25 MG tablet Take 25 mg by mouth every 6 (six) hours as needed.     metoprolol succinate (TOPROL-XL) 50 MG 24 hr tablet TAKE ONE TABLET EVERY MORNING 30 tablet 9   OXYGEN Inhale 2.5 L into the lungs continuous.      PAZEO 0.7 % SOLN Place 1 drop into both eyes at bedtime.     potassium chloride SA (KLOR-CON) 20 MEQ tablet TAKE ONE TABLET EVERY MORNING 30 tablet 9  Respiratory Therapy Supplies (FLUTTER) DEVI 1 Device by Does not apply route 3 (three) times daily. 1 each 0   spironolactone (ALDACTONE) 25 MG tablet Take 0.5 tablets (12.5 mg total) by mouth daily. 15 tablet 12   zolpidem (AMBIEN) 10 MG tablet Take 10 mg by mouth at bedtime.     doxycycline (VIBRA-TABS) 100 MG tablet Take 1 tablet (100 mg total) by mouth 2 (two) times daily. (Patient not taking: Reported on 01/11/2022) 14 tablet 0   predniSONE (DELTASONE) 10 MG tablet 4 tabs for 3 days, then 3 tabs for 3 days, 2 tabs for 3 days, then 1 tab for 3 days, then stop (Patient not taking: Reported on 01/11/2022) 30 tablet 0   No facility-administered medications prior to visit.   Review of Systems  Review of Systems  Constitutional: Negative.   HENT:  Positive for congestion.   Respiratory:  Positive for cough.   Cardiovascular: Negative.     Physical Exam  BP 132/62 (BP Location: Right Arm, Cuff Size: Large)    Pulse 76    Temp 97.9 F (36.6 C) (Temporal)    Ht 5' (1.524 m)    Wt 191 lb 3.2 oz (86.7 kg)    LMP 06/04/2000 (LMP Unknown) Comment: tubal ligation   SpO2 98%    BMI 37.34 kg/m  Physical Exam Constitutional:      Appearance: Normal appearance.  HENT:     Head: Normocephalic and atraumatic.     Mouth/Throat:     Mouth: Mucous membranes are moist.     Pharynx: Oropharynx is clear.  Cardiovascular:     Rate and Rhythm: Normal rate and regular rhythm.     Comments: No edema Pulmonary:      Effort: Pulmonary effort is normal.     Breath sounds: Normal breath sounds. No wheezing, rhonchi or rales.  Musculoskeletal:        General: Normal range of motion.     Cervical back: Normal range of motion and neck supple.  Skin:    General: Skin is warm and dry.  Neurological:     General: No focal deficit present.     Mental Status: She is alert and oriented to person, place, and time. Mental status is at baseline.  Psychiatric:        Mood and Affect: Mood normal.        Behavior: Behavior normal.        Thought Content: Thought content normal.        Judgment: Judgment normal.     Lab Results:  CBC    Component Value Date/Time   WBC 5.7 05/10/2020 1004   WBC 6.2 08/31/2019 1800   RBC 4.30 05/10/2020 1004   RBC 4.07 08/31/2019 1800   HGB 12.3 05/10/2020 1004   HCT 36.9 05/10/2020 1004   PLT 193 05/10/2020 1004   MCV 86 05/10/2020 1004   MCH 28.6 05/10/2020 1004   MCH 28.3 08/31/2019 1800   MCHC 33.3 05/10/2020 1004   MCHC 33.1 08/31/2019 1800   RDW 15.6 (H) 05/10/2020 1004   LYMPHSABS 1.8 08/31/2019 1800   MONOABS 0.6 08/31/2019 1800   EOSABS 0.3 08/31/2019 1800   BASOSABS 0.0 08/31/2019 1800    BMET    Component Value Date/Time   NA 143 05/10/2020 1004   K 4.2 05/10/2020 1004   CL 100 05/10/2020 1004   CO2 34 (H) 05/10/2020 1004   GLUCOSE 162 (H) 05/10/2020 1004   GLUCOSE 127 (H) 08/31/2019 1800  BUN 14 05/10/2020 1004   CREATININE 1.02 (H) 05/10/2020 1004   CALCIUM 9.9 05/10/2020 1004   GFRNONAA 60 05/10/2020 1004   GFRAA 69 05/10/2020 1004    BNP    Component Value Date/Time   BNP 11.3 08/31/2019 1800    ProBNP    Component Value Date/Time   PROBNP 10 05/10/2020 1004   PROBNP 16.1 07/05/2014 1230    Imaging: DG Chest 2 View  Result Date: 12/28/2021 CLINICAL DATA:  COPD exacerbation EXAM: CHEST - 2 VIEW COMPARISON:  None. FINDINGS: The heart size and mediastinal contours are within normal limits. Minimal diffuse bilateral interstitial  pulmonary opacity. Disc degenerative disease of the thoracic spine. IMPRESSION: Minimal diffuse bilateral interstitial pulmonary opacity, suggestive of minimal edema or atypical/viral infection. No focal airspace opacity. Electronically Signed   By: Delanna Ahmadi M.D.   On: 12/28/2021 13:01     Assessment & Plan:   COPD with acute exacerbation ([place]) - Patient noticed significant improvement in her breathing and decreased in exacerbations since starting Daliresp. She is currently in the middle of a flare but is some better compared to last visit. She still has a productive cough with purulent mucus. She never received higher dose Trelegy, we will give her a sample 240mcg today along with short prednisone taper. We will repeat CXR today to follow-up on mild bilateral opacities. Sputum sample if able. Continue mucinex twice daily and flutter valve.   Chronic respiratory failure with hypoxia (HCC) - Stable; Continue 3-4L supplemental oxygen to maintain O2 88-90%. She was unable to afford pulmonary rehab but will consider again in the future   Martyn Ehrich, NP 01/11/2022

## 2022-01-10 NOTE — Assessment & Plan Note (Addendum)
-   Prolonged exacerbation. No improvement with recent course Doxycycline and prednisone taper. Using SABA 4-5 times a day. Plan increased Trelegy 292mcg one puff daily. Checking CXR and sputum culture. Continue Daliresp 555mcg. Start mucinex 600mg  twice daily as needed for congestion and use flutter valve three times a day. FU in 2 weeks or sooner if needed. ?

## 2022-01-10 NOTE — Assessment & Plan Note (Signed)
-   Continue Nexium 40mg daily 

## 2022-01-11 ENCOUNTER — Other Ambulatory Visit: Payer: Self-pay

## 2022-01-11 ENCOUNTER — Ambulatory Visit (INDEPENDENT_AMBULATORY_CARE_PROVIDER_SITE_OTHER): Payer: Medicaid Other

## 2022-01-11 ENCOUNTER — Ambulatory Visit (INDEPENDENT_AMBULATORY_CARE_PROVIDER_SITE_OTHER): Payer: Medicaid Other | Admitting: Primary Care

## 2022-01-11 ENCOUNTER — Encounter: Payer: Self-pay | Admitting: Primary Care

## 2022-01-11 VITALS — BP 132/62 | HR 76 | Temp 97.9°F | Ht 60.0 in | Wt 191.2 lb

## 2022-01-11 DIAGNOSIS — J441 Chronic obstructive pulmonary disease with (acute) exacerbation: Secondary | ICD-10-CM

## 2022-01-11 DIAGNOSIS — J449 Chronic obstructive pulmonary disease, unspecified: Secondary | ICD-10-CM

## 2022-01-11 DIAGNOSIS — J9611 Chronic respiratory failure with hypoxia: Secondary | ICD-10-CM | POA: Diagnosis not present

## 2022-01-11 MED ORDER — TRELEGY ELLIPTA 200-62.5-25 MCG/ACT IN AEPB: 1.0000 | INHALATION_SPRAY | Freq: Every day | RESPIRATORY_TRACT | 1 refills | Status: DC

## 2022-01-11 MED ORDER — PREDNISONE 10 MG PO TABS
ORAL_TABLET | ORAL | 0 refills | Status: DC
Start: 2022-01-11 — End: 2022-02-07

## 2022-01-11 NOTE — Patient Instructions (Signed)
Recommendations: ?Start Trelegy 200- take one puff daily in the morning (2 week sample given) ?Continue Daliresp 563mcg daily  ?Continue Flonase nasal spray  ?Continue mucinex and flutter valve for another 1-2 weeks until congestion improved ?If able to give sputum sample please bring back to office  ? ?Rx: ?Prednisone taper as directed  ? ?Orders: ?CXR today (ordered) ? ?Follow-up: ?2-3 weeks with Eustaquio Maize NP  ?

## 2022-01-11 NOTE — Addendum Note (Signed)
Addended by: Mathis Bud on: 01/11/2022 10:51 AM ? ? Modules accepted: Orders ? ?

## 2022-01-11 NOTE — Assessment & Plan Note (Addendum)
-   Patient noticed significant improvement in her breathing and decreased in exacerbations since starting Daliresp. She is currently in the middle of a flare but is some better compared to last visit. She still has a productive cough with purulent mucus. She never received higher dose Trelegy, we will give her a sample 273mcg today along with short prednisone taper. We will repeat CXR today to follow-up on mild bilateral opacities. Sputum sample if able. Continue mucinex twice daily and flutter valve.  ?

## 2022-01-11 NOTE — Assessment & Plan Note (Signed)
-   Stable; Continue 3-4L supplemental oxygen to maintain O2 88-90%. She was unable to afford pulmonary rehab but will consider again in the future ?

## 2022-01-16 NOTE — Progress Notes (Signed)
Please let patient know CXR showed mild interstitial thickening similar to previous, likely reflects chronic scarring. No focal pneumonia

## 2022-01-19 ENCOUNTER — Telehealth: Payer: Self-pay | Admitting: Primary Care

## 2022-01-19 MED ORDER — DOXYCYCLINE HYCLATE 100 MG PO TABS: 100.0000 mg | ORAL_TABLET | Freq: Two times a day (BID) | ORAL | 0 refills | Status: DC

## 2022-01-19 NOTE — Telephone Encounter (Signed)
Please have her take doxycycline '100mg'$  bid x 7 days ?Needs OV with APP or RB in next 1-2 weeks to assess improvement ?

## 2022-01-19 NOTE — Telephone Encounter (Signed)
Spoke with the pt and notified of response per RB  ?She verbalized understanding  ?She has appt with BW already on 01/31/22 and I advised be sure and keep appt  ?Doxy sent to preferred pharm ?

## 2022-01-19 NOTE — Telephone Encounter (Signed)
Spoke with the pt  ?She states her breathing has not improved since recent ov 01/11/22  ?She is wheezing some, feels SOB when she wakes up in the morning  ?She is coughing up yellow sputun  ?No fever  ?Using flonase, mucinex, trelegy, tessalon, albuterol nebs  ?I offered ov since RB had opening today and she refused  ?She states would like something called in  ?Please advise, thanks! ? ?Allergies  ?Allergen Reactions  ? Acetaminophen Other (See Comments)  ?  Pt was told that she could not take Tylenol, does not know why.  ? ? ?

## 2022-01-31 ENCOUNTER — Ambulatory Visit: Payer: Medicaid Other | Admitting: Primary Care

## 2022-02-07 ENCOUNTER — Ambulatory Visit (INDEPENDENT_AMBULATORY_CARE_PROVIDER_SITE_OTHER): Payer: Medicaid Other | Admitting: Primary Care

## 2022-02-07 ENCOUNTER — Other Ambulatory Visit: Payer: Self-pay

## 2022-02-07 ENCOUNTER — Encounter: Payer: Self-pay | Admitting: Primary Care

## 2022-02-07 DIAGNOSIS — J441 Chronic obstructive pulmonary disease with (acute) exacerbation: Secondary | ICD-10-CM | POA: Diagnosis not present

## 2022-02-07 DIAGNOSIS — K219 Gastro-esophageal reflux disease without esophagitis: Secondary | ICD-10-CM | POA: Diagnosis not present

## 2022-02-07 DIAGNOSIS — J9611 Chronic respiratory failure with hypoxia: Secondary | ICD-10-CM

## 2022-02-07 MED ORDER — FAMOTIDINE 20 MG PO TABS: 20.0000 mg | ORAL_TABLET | Freq: Every day | ORAL | 0 refills | Status: DC

## 2022-02-07 MED ORDER — BREZTRI AEROSPHERE 160-9-4.8 MCG/ACT IN AERO: 2.0000 | INHALATION_SPRAY | Freq: Two times a day (BID) | RESPIRATORY_TRACT | 0 refills | Status: DC

## 2022-02-07 NOTE — Patient Instructions (Addendum)
Recommendations: ?Start trial Judithann Sauger - take two puffs morning and evening (do not take trelegy while trying sample) ?Take Mucinex as needed for congestion ?Continue Daliresp (roflumilast) ?Continue Nexium '40mg'$  daily ?Start famotidine '20mg'$  at bedtime  ? ?Orders: ?Sputum culture (shouldve been already ordered) ? ?Follow-up:  ?2 weeks with Eustaquio Maize NP  ?

## 2022-02-07 NOTE — Assessment & Plan Note (Signed)
-   Increased reflux symptoms. Currently taking nexium '40mg'$  daily. Adding Famotidine '20mg'$  at bedtime. Encourage GERD diet and weight loss.  ?

## 2022-02-07 NOTE — Progress Notes (Signed)
? ?'@Patient'$  ID: Stephanie Schultz, female    DOB: [birth year], 66 y.o.   MRN: [id number] ? ?Chief Complaint  ?Patient presents with  ? Follow-up  ?  Still has sob same, cough-yellow, white  ? ? ?Referring provider: ?Stephanie Reach, Schultz ? ?HPI: ?66 year old female, former smoker quit in 2016 (108-pack-year history).  Significant for COPD component, chronic cough, chronic respiratory failure O2 dependent, pulmonary nodule, allergic rhinitis, chronic diastolic heart failure, hypertension, GERD, type 2 diabetes, obesity.  Patient of Stephanie Schultz. Maintained on Trelegy, Daliresp, prn albuterol and continuous oxygen at 3-4L/min. ? ?Previous [facility] pulmonary encounters: ?08/24/20- ROV, Stephanie Schultz  ?Follow-up visit for 55 year old woman with very severe COPD with asthmatic features.  Also with superimposed upper airway irritation syndrome and chronic cough.  Followed for hypertension and diastolic dysfunction, obesity.  She has chronic hypoxemia on 2 to 3 L/min with documented nocturnal hypoxemia, no OSA. ?She reports today that she is doing pretty well. She is on daliresp for the last 2 months, on Trelegy. Has albuterol that she uses . She is on flonase, zyrtec. On esomeprazole. Her last flare was over 6 months ago. COVID vaccine up to date, Flu up to date. Needs PNA shot after age 51 ?PFT done today reviewed by me show very severe obstruction. Restricted volumes, decreased diffusion capacity.  ? ?She needs 6 more green e-tanks for her O2 from [employer].  ? ?03/14/2021  ?Patient presents today for 6 month follow-up COPD.  Her breathing has more some worse over the last two week with np cough. Feels it is related to seasonal allergies. She is using flonase nasal spray daily. Asking to try tesslon perles. She has not had a recent exacerbation, prior to being on Daliresp she was experiencing frequent flare ups. Maintained on Trelegy 100, prn albuterol and Daliresp. She is on oxygen using 2-4L. She has lost 30 lbs, she has been eating  salads and is exercising with bicycle / weights. She states that she can do more with her daughter/grandchildren and can walker longer distances. She is off all her diabetes medications.  ? ?09/20/2021 ?Patient presents today for 6 month follow-up. She is doing very well. She has had significantly less amount of exacerbations his past year since starting Daliresp. She was hospitalized overnight at [facility] hospital in September for COPD. She is doing well today. She has no significant shortness of breath. She keeps a chronic cough, tessalon perles help. She is compliant with Trelegy and daliresp. She uses albuterol nebulizer morning and evening. She is on 2.5-3L oxygen at rest and 4L with exertion. No increased O2 demand. ? ?Dyspnea: None  ?Cough: Chronic cough  ?SABA: Use twice a day scheduled  ?Oxygen: 2.5-4L ?Nocturnal: 1-2 pillows ? ?12/19/2021 ?Patient contacted today for acute virtual/video visit. Following with our office for hx COPD and chronic respiratory failure. Patient reports symptoms of cough, shortness of breath and chest tightness over x 6 weeks. Breathing is worse at night. Cough is productive with green-yellow mucus. She is maintained on Trelegy 173mg, Daliresp, prn albuterol and continuous oxygen at 3-4L/min. She was last hospitalized in September for COPD exacerbation.  ? ?12/28/2021  ?Patient presents today for 6 week follow-up. She was treated for acute exacerbation last week with Doxycycline and prednisone taper without significant improvement. She still has cough with yellow mucus along with chest tightness, nasal congestion and ear fullness. She has not tried taking mucinex and is not currently using Flonase. She is taking Trelegy 1078m and Daliresp as prescribed.  She is using her albuterol 4-5 times a day. On 3L oxygen 24/7.  ? ?01/11/2022 ?Patient presents today for 2 week follow-up prolonged COPD exacerbation. During last visit we increased her Trelegy to 231mg. Ordered CXR and sputum  culture. Advised she start mucinex and flutter valve.  ? ?She is doing some better. She completed course of doxycyline and prednisone in early march. She still has a cough and some chest tightness but states that it is not as bad as last visit. She has been taking mucinex and flutter valve. Cough is productive with yellow-green mucus, she has not yet been able to provide sputum sample. She was not able to get higher dose Trelegy d/t insurance. We will give her a sample today for her to try. She is on 3L oxygen at rest. Patient reports being inactive and basically home bound before starting on Daliresp. She is able to do much more and is pursing her GED.  ? ?02/07/2022- Interim hx  ?Patient called our office on 3/10 with reports of morning shortness of breath, wheezing and productive cough with yellow mucus. She was sent in course of Doxycycline. She has not been able to provide u[place]ore[place] a sputum culture. She is doing better. Her only complaint is chest tightness, thinks it is related to reflux. She is taking nexium '40mg'$  daily in the morning. Using 2L supplemental oxygen at rest and 3L on exertion. She has very severe airflow limitation on pulmonary function testing. No significant BD response.  ? ?Recent COPD exacerbations: ?January 19th, 2021-doxycycline, prednisone taper ?Mar 15, 2020- Z-Pak ?April 18, 2020- Z-Pak, prednisone taper ?May 17, 2020- Doxycycline, prednisone taper  ?03/14/2021 - Increased Trelegy 2019m x 2 weeks/ holding off on oral steriods and no indication for abx at this time  ?05/01/21- Called in with sob/cough and given zpack and prednisone  ?07/2021- Hospitalized overnight for COPD exacerbation ?12/19/2021- Doxy/prednisone taper  ?01/11/2022- Pred taper, sample Trelegy 20026m(COPD exacerbation from Feb never completely resolved) ?01/19/2022- No improvement from 3/10, sent in doxycycline  ? ? ?TEST/EVENTS :  ?Cardiac: ?> 08/23/2017-echocardiogram-LV ejection fraction 60 to 65% ?> 06/01/20- echocardiogram-  LV ejection fraction 60-65%, mild concentric left ventricular hypertrophy, normal diastolic parameters  ?  ?Pulmonary function testing: ?> 11/16/2013 PFTs- FVC 1.58 (67% predicted), postbronchodilator ratio 60, postbronchodilator FEV1 1.00 (53% predicted), no significant bronchodilator response, mid flow reversibility after administration of bronchodilator, DLCO 39 ?> 01/12/19 PFTs-FVC 1.18 (52%), FEV1 0.66 (37%), ratio 56, borderline bronchodilator response, DLCO correlated 6.07 (34%) ?  ?Imaging: ?> October 2020 CT chest - Neg for PE, +emphysema with no acute process. ?> 05/11/20 CTA- No PE, Chronic emphysematous changes in the upper lobes. ?Chronic linear scarring at the lung bases, left more than right. No ?effusions or acute infiltrates.  ? ? ? ? ? ? ? ? ? ?Allergies  ?Allergen Reactions  ? Acetaminophen Other (See Comments)  ?  Pt was told that she could not take Tylenol, does not know why.  ? ? ?Immunization History  ?Administered Date(s) Administered  ? Influenza Split 09/20/2011, 09/12/2012, 08/12/2013  ? Influenza,inj,Quad PF,6+ Mos 08/03/2014, 08/12/2015, 08/12/2017, 07/29/2018, 09/11/2021  ? Influenza,inj,Quad PF,6-35 Mos 09/11/2019  ? PFIZER(Purple Top)SARS-COV-2 Vaccination 02/05/2020, 02/25/2020, 11/08/2020  ? Pneumococcal Conjugate-13 09/24/2017  ? Pneumococcal Polysaccharide-23 11/12/2009  ? ? ?Past Medical History:  ?Diagnosis Date  ? Anemia   ? chronis  ? Anxiety   ? Arthritis   ? [place]  ? cervical   ? Chronic diastolic CHF (congestive heart  failure) ([place]) 08/10/2016  ? COPD (chronic obstructive pulmonary disease) ([place])   ? O2 dependent. Pulmo: Stephanie Schultz  ? Diabetes mellitus without complication ([place])   ? a. A1c 8.3 in 11/2015  ? Emphysema   ? Gallstones   ? s/p cholecystectomy  ? GERD (gastroesophageal reflux disease)   ? Gout   ? History of home oxygen therapy   ? "2.5L; 24/7" (08/10/2016)  ? Hx of cardiac catheterization   ? a. LHC at [facility] in [place], [place] 09/2008:  Normal coronary  arteries EF 70%. b. LHC 11/2016: normal cors, normal LVEDP, EF 55-65%.  ? Hyperlipidemia   ? Hypertension   ? Hypoxia 11/17/2018  ? Morbid obesity ([place])   ? OSA on CPAP   ? CPAP at night   ? Pneumonia   ? Sick

## 2022-02-07 NOTE — Assessment & Plan Note (Signed)
-   Patient overall is doing well. She had prolonged exacerbation between February and March 2023. She has a chronic cough with clear-yellow mucus production. Associated chest tightness. Overall her enery level is better, she is doing more. Plan trial Breztri Aerosphere two puffs twice daily. Continue to encourage she provide [place] with a sputum sample if able. Continue Daliresp 575mg daily. She would benefit from pulmonary rehab but in the past was not able to afford, we will replace order. [facility]  ?

## 2022-02-07 NOTE — Assessment & Plan Note (Signed)
-   Stable; No increased demand. Using 2L oxygen with rest and 3L on exertion.  ?

## 2022-02-21 ENCOUNTER — Ambulatory Visit: Payer: Medicaid Other | Admitting: Primary Care

## 2022-03-15 ENCOUNTER — Ambulatory Visit (INDEPENDENT_AMBULATORY_CARE_PROVIDER_SITE_OTHER): Payer: Medicaid Other | Admitting: Primary Care

## 2022-03-15 ENCOUNTER — Telehealth: Payer: Self-pay | Admitting: Primary Care

## 2022-03-15 ENCOUNTER — Encounter: Payer: Self-pay | Admitting: Primary Care

## 2022-03-15 VITALS — BP 122/68 | HR 69 | Temp 98.4°F | Ht 60.0 in | Wt 192.6 lb

## 2022-03-15 DIAGNOSIS — K219 Gastro-esophageal reflux disease without esophagitis: Secondary | ICD-10-CM

## 2022-03-15 DIAGNOSIS — I5189 Other ill-defined heart diseases: Secondary | ICD-10-CM

## 2022-03-15 DIAGNOSIS — J9611 Chronic respiratory failure with hypoxia: Secondary | ICD-10-CM

## 2022-03-15 DIAGNOSIS — J309 Allergic rhinitis, unspecified: Secondary | ICD-10-CM | POA: Diagnosis not present

## 2022-03-15 DIAGNOSIS — I5032 Chronic diastolic (congestive) heart failure: Secondary | ICD-10-CM

## 2022-03-15 DIAGNOSIS — J441 Chronic obstructive pulmonary disease with (acute) exacerbation: Secondary | ICD-10-CM | POA: Diagnosis not present

## 2022-03-15 LAB — BASIC METABOLIC PANEL
BUN: 14 mg/dL (ref 6–23)
CO2: 37 mEq/L — ABNORMAL HIGH (ref 19–32)
Calcium: 9.9 mg/dL (ref 8.4–10.5)
Chloride: 98 mEq/L (ref 96–112)
Creatinine, Ser: 1 mg/dL (ref 0.40–1.20)
GFR: 59.94 mL/min — ABNORMAL LOW (ref >60.00)
Glucose, Bld: 112 mg/dL — ABNORMAL HIGH (ref 70–99)
Potassium: 3.9 mEq/L (ref 3.5–5.1)
Sodium: 141 mEq/L (ref 135–145)

## 2022-03-15 LAB — BRAIN NATRIURETIC PEPTIDE: Pro B Natriuretic peptide (BNP): 11 pg/mL (ref 0.0–100.0)

## 2022-03-15 NOTE — Assessment & Plan Note (Signed)
-   Continue lasix '60mg'$  daily  ?- Checking BMET and BNP  ?

## 2022-03-15 NOTE — Assessment & Plan Note (Signed)
-   Continue Zyrtec '10mg'$  daily and fluticasone nasal spray as needed  ?

## 2022-03-15 NOTE — Assessment & Plan Note (Signed)
-   Stable; No acute complaints. Last exacerbation was in Feb/March. Cough and chest tightness have improved. CAT score 14. Prefers Trelegy over [employer]. Using SABA 1-2 times a day. Continue Trelegy 258mg daily, prn albuterol hfa/neb and daliresp 5064m daily. FU in 3 months or sooner if needed.  ?

## 2022-03-15 NOTE — Telephone Encounter (Signed)
Can you check on pulmonary rehab referral placed during last OV 2 weeks ago ?

## 2022-03-15 NOTE — Assessment & Plan Note (Addendum)
-   Stable; No new requirements. ?- Continue supplemental oxygen at 2-3L to maintain O2 >88-90%  ?- Referred to pulmonary rehab at last OV ?

## 2022-03-15 NOTE — Progress Notes (Signed)
? ?'@Patient'$  ID: Stephanie Schultz, female    DOB: 20-Sep-1956, 66 y.o.   MRN: [id number] ? ?Chief Complaint  ?Patient presents with  ? Follow-up  ?  Pt states since last visit she is doing better. Pt stated that she did not do well with the [facility] and stopped it and went back to her Trelegy inhaler.   ? ? ?Referring provider: ?[name], MD ? ?HPI: ?66 year old female, former smoker quit in 2016 (108-pack-year history).  Significant for COPD component, chronic cough, chronic respiratory failure O2 dependent, pulmonary nodule, allergic rhinitis, chronic diastolic heart failure, hypertension, GERD, type 2 diabetes, obesity.  Patient of [name]. Maintained on Trelegy, Daliresp, prn albuterol and continuous oxygen at 3-4L/min. ? ?Previous [facility] pulmonary encounters: ?08/24/20- ROV, [name]  ?Follow-up visit for 66 year old woman with very severe COPD with asthmatic features.  Also with superimposed upper airway irritation syndrome and chronic cough.  Followed for hypertension and diastolic dysfunction, obesity.  She has chronic hypoxemia on 2 to 3 L/min with documented nocturnal hypoxemia, no OSA. ?She reports today that she is doing pretty well. She is on daliresp for the last 2 months, on Trelegy. Has albuterol that she uses . She is on flonase, zyrtec. On esomeprazole. Her last flare was over 6 months ago. COVID vaccine up to date, Flu up to date. Needs PNA shot after age 59 ?PFT done today reviewed by me show very severe obstruction. Restricted volumes, decreased diffusion capacity.  ? ?She needs 6 more green e-tanks for her O2 from [employer].  ? ?03/14/2021  ?Patient presents today for 6 month follow-up COPD.  Her breathing has more some worse over the last two week with np cough. Feels it is related to seasonal allergies. She is using flonase nasal spray daily. Asking to try tesslon perles. She has not had a recent exacerbation, prior to being on Daliresp she was experiencing frequent flare ups. Maintained  on Trelegy 100, prn albuterol and Daliresp. She is on oxygen using 2-4L. She has lost 30 lbs, she has been eating salads and is exercising with bicycle / weights. She states that she can do more with her daughter/grandchildren and can walker longer distances. She is off all her diabetes medications.  ? ?09/20/2021 ?Patient presents today for 6 month follow-up. She is doing very well. She has had significantly less amount of exacerbations his past year since starting Daliresp. She was hospitalized overnight at [facility] hospital in September for COPD. She is doing well today. She has no significant shortness of breath. She keeps a chronic cough, tessalon perles help. She is compliant with Trelegy and daliresp. She uses albuterol nebulizer morning and evening. She is on 2.5-3L oxygen at rest and 4L with exertion. No increased O2 demand. ? ?Dyspnea: None  ?Cough: Chronic cough  ?SABA: Use twice a day scheduled  ?Oxygen: 2.5-4L ?Nocturnal: 1-2 pillows ? ?12/19/2021 ?Patient contacted today for acute virtual/video visit. Following with our office for hx COPD and chronic respiratory failure. Patient reports symptoms of cough, shortness of breath and chest tightness over x 6 weeks. Breathing is worse at night. Cough is productive with green-yellow mucus. She is maintained on Trelegy 134mg, Daliresp, prn albuterol and continuous oxygen at 3-4L/min. She was last hospitalized in September for COPD exacerbation.  ? ?12/28/2021  ?Patient presents today for 6 week follow-up. She was treated for acute exacerbation last week with Doxycycline and prednisone taper without significant improvement. She still has cough with yellow mucus along with chest tightness, nasal  congestion and ear fullness. She has not tried taking mucinex and is not currently using Flonase. She is taking Trelegy 177mg and Daliresp as prescribed. She is using her albuterol 4-5 times a day. On 3L oxygen 24/7.  ? ?01/11/2022 ?Patient presents today for 2 week follow-up  prolonged COPD exacerbation. During last visit we increased her Trelegy to 2068m. Ordered CXR and sputum culture. Advised she start mucinex and flutter valve.  ? ?She is doing some better. She completed course of doxycyline and prednisone in early march. She still has a cough and some chest tightness but states that it is not as bad as last visit. She has been taking mucinex and flutter valve. Cough is productive with yellow-green mucus, she has not yet been able to provide sputum sample. She was not able to get higher dose Trelegy d/t insurance. We will give her a sample today for her to try. She is on 3L oxygen at rest. Patient reports being inactive and basically home bound before starting on Daliresp. She is able to do much more and is pursing her GED.  ? ?02/07/2022 ?Patient called our office on 3/10 with reports of morning shortness of breath, wheezing and productive cough with yellow mucus. She was sent in course of Doxycycline. She has not been able to provide us[place]oreaith a sputum culture. She is doing better. Her only complaint is chest tightness, thinks it is related to reflux. She is taking nexium '40mg'$  daily in the morning. Using 2L supplemental oxygen at rest and 3L on exertion. She has very severe airflow limitation on pulmonary function testing. No significant BD response.  ? ?03/15/2022 - Interim hx  ?Patient presents today for 2 week follow-up. She is doing well. No recent exacerbations, last one was in Feb/March. During last visit we gave her a sample of Breztri to try. She noticed increased fatigue and chest tightness while taking Breztri. Some days she has lots of energy and other days she does not. She has since resumed Trelegy 20011mone puff daily. She still has an occasional cough, relates it to the pollen.   No significant mucus production. She takes zyrtec daily and uses flonase as needed. She uses albuterol nebulizer morning and evening. Chest tightness has resolved. She has been taking reflux  medication as directed. She is worried she is not voiding as much as she should be. She takes lasix '60mg'$  daily in the morning, no significant leg swelling. Some eye puffiness. CAT score 14.  ? ? ?Recent COPD exacerbations: ?January 19th, 2021-doxycycline, prednisone taper ?Mar 15, 2020- Z-Pak ?April 18, 2020- Z-Pak, prednisone taper ?May 17, 2020- Doxycycline, prednisone taper  ?03/14/2021 - Increased Trelegy 200m37m 2 weeks/ holding off on oral steriods and no indication for abx at this time  ?05/01/21- Called in with sob/cough and given zpack and prednisone  ?07/2021- Hospitalized overnight for COPD exacerbation ?12/19/2021- Doxy/prednisone taper  ?01/11/2022- Pred taper, sample Trelegy 200mc22mOPD exacerbation from Feb never completely resolved) ?01/19/2022- No improvement from 3/10, sent in doxycycline  ? ? ?TEST/EVENTS :  ?Cardiac: ?> 08/23/2017-echocardiogram-LV ejection fraction 60 to 65% ?> 06/01/20- echocardiogram- LV ejection fraction 60-65%, mild concentric left ventricular hypertrophy, normal diastolic parameters  ?  ?Pulmonary function testing: ?> 11/16/2013 PFTs- FVC 1.58 (67% predicted), postbronchodilator ratio 60, postbronchodilator FEV1 1.00 (53% predicted), no significant bronchodilator response, mid flow reversibility after administration of bronchodilator, DLCO 39 ?> 01/12/19 PFTs-FVC 1.18 (52%), FEV1 0.66 (37%), ratio 56, borderline bronchodilator response, DLCO correlated 6.07 (34%) ?  ?Imaging: ?>  October 2020 CT chest - Neg for PE, +emphysema with no acute process. ?> 05/11/20 CTA- No PE, Chronic emphysematous changes in the upper lobes. ?Chronic linear scarring at the lung bases, left more than right. No ?effusions or acute infiltrates.  ? ? ? ? ? ? ?Allergies  ?Allergen Reactions  ? Acetaminophen Other (See Comments)  ?  Pt was told that she could not take Tylenol, does not know why.  ? ? ?Immunization History  ?Administered Date(s) Administered  ? Influenza Split 09/20/2011, 09/12/2012, 08/12/2013  ?  Influenza,inj,Quad PF,6+ Mos 08/03/2014, 08/12/2015, 08/12/2017, 07/29/2018, 09/11/2021  ? Influenza,inj,Quad PF,6-35 Mos 09/11/2019  ? PFIZER(Purple Top)SARS-COV-2 Vaccination 02/05/2020, 02/25/2020, 1

## 2022-03-15 NOTE — Addendum Note (Signed)
Addended by: Martyn Ehrich on: 03/15/2022 11:25 AM ? ? Modules accepted: Orders ? ?

## 2022-03-15 NOTE — Telephone Encounter (Signed)
Ok I will place order with today's visit

## 2022-03-15 NOTE — Assessment & Plan Note (Signed)
-   Chest tightness improved with addition of H2 blocker  ?- Continue Esomeprazole '40mg'$  daily and Famotidine '20mg'$  at bedtime  ?

## 2022-03-15 NOTE — Patient Instructions (Signed)
Recommendations: ?- Continue Trelegy 200- take one puff daily in the morning ?- Use albuterol inhaler or neb every 6 hours as needed for shortness of breath or wheezing  ?- Continue Nexium one daily ?- Continue Pepcid '20mg'$  bedtime ?- Continue Daliresp daily ?- Continue Zyrtec daily and flonase as needed  ?- Continue oxygen at 2 L as directed  ? ?Follow-up: ?- 3 months with [name]  ?

## 2022-03-15 NOTE — Telephone Encounter (Signed)
Beth patient was seen 02/07/22 per your note see below ?She would benefit from pulmonary rehab but in the past was not able to afford, we will replace order. FU in 2 weeks with [facility] NP.  ?  ?I don't see that a new Pulmonary Rehab order was placed ?

## 2022-03-23 NOTE — Progress Notes (Signed)
BNP and kidney function were normal. Continue lasix '60mg'$  daily.

## 2022-03-29 ENCOUNTER — Telehealth (HOSPITAL_COMMUNITY): Payer: Self-pay

## 2022-03-29 NOTE — Telephone Encounter (Signed)
Called and spoke with pt in regards to PR, pt stated she would like to attend PR at [place] pt I will fax her PR referral to [facility].   Closed referral

## 2022-04-12 NOTE — Telephone Encounter (Signed)
Pulmonary Rehab order placed on 03/15/22  03/29/2022 11:18 AM [place], [facility] -  Note:  Cheron Schaumann Called and spoke with pt in regards to PR, pt stated she would like to attend PR at [place] pt I will fax her PR referral to [facility].

## 2022-05-26 ENCOUNTER — Other Ambulatory Visit: Payer: Self-pay | Admitting: Cardiology

## 2022-05-28 ENCOUNTER — Other Ambulatory Visit: Payer: Self-pay | Admitting: Emergency Medicine

## 2022-05-28 MED ORDER — TRELEGY ELLIPTA 200-62.5-25 MCG/ACT IN AEPB: 1.0000 | INHALATION_SPRAY | Freq: Every day | RESPIRATORY_TRACT | 5 refills | Status: DC

## 2022-05-30 ENCOUNTER — Other Ambulatory Visit: Payer: Self-pay | Admitting: Emergency Medicine

## 2022-06-05 NOTE — Progress Notes (Signed)
Office Visit    Patient Name: Stephanie Schultz Date of Encounter: 06/05/2022  PCP:  Rometta Emery, MD   [facility]  Cardiologist:  Armanda Magic, MD  Advanced Practice Provider:  No care team member to display Electrophysiologist:  None   HPI    Stephanie Schultz is a 66 y.o. female with a past medical history significant for anxiety, COPD, diabetes mellitus, gout, tobacco use, diastolic CHF, OSA on CPAP and hypertension presents today for hospital follow-up.  Stephanie Schultz was recently seen in the ED for shortness of breath and chest pain x5 days.  She is on home oxygen.  Labs were drawn.  Work-up was reassuring.  She felt better after DuoNeb's.  Augmentin was prescribed.  Thought to be an exacerbation of her COPD.  EKG, troponin, proBNP, CBC and CMP were unremarkable.  She returns the office for hospital follow-up.  She was last seen in the office by Armanda Magic, MD 07/2021.  At that time she had been doing well.  She was stable on 3 L nasal cannula.  She was compliant with all her meds.  Today, she ***  Past Medical History    Past Medical History:  Diagnosis Date   Anemia    chronis   Anxiety    Arthritis    Cervical cancer (HCC) 1990   cervical    Chronic diastolic CHF (congestive heart failure) (HCC) 08/10/2016   COPD (chronic obstructive pulmonary disease) (HCC)    O2 dependent. Pulmo: Dr. Delton Coombes   Diabetes mellitus without complication (HCC)    a. A1c 8.3 in 11/2015   Emphysema    Gallstones    s/p cholecystectomy   GERD (gastroesophageal reflux disease)    Gout    History of home oxygen therapy    "2.5L; 24/7" (08/10/2016)   Hx of cardiac catheterization    a. LHC at [facility] in [place], [place] 09/2008:  Normal coronary arteries EF 70%. b. LHC 11/2016: normal cors, normal LVEDP, EF 55-65%.   Hyperlipidemia    Hypertension    Hypoxia 11/17/2018   Morbid obesity (HCC)    OSA on CPAP    CPAP at night    Pneumonia    Sickle cell trait  (HCC)    Tobacco abuse    a. up to 3ppd from age 27 to 28, now 1/4 ppd (01/2013) >> Quit 10/2015   Past Surgical History:  Procedure Laterality Date   BREAST BIOPSY Right 02/23/2019   x2   BREAST BIOPSY Left 2014   CARDIAC CATHETERIZATION     CARDIAC CATHETERIZATION N/A 12/03/2016   Procedure: Left Heart Cath and Coronary Angiography;  Surgeon: Peter M Swaziland, MD;  Location: [facility] INVASIVE CV LAB;  Service: Cardiovascular;  Laterality: N/A;   CHOLECYSTECTOMY N/A 12/13/2015   Procedure: LAPAROSCOPIC CHOLECYSTECTOMY;  Surgeon: Axel Filler, MD;  Location: [facility] ORS;  Service: General;  Laterality: N/A;   COLONOSCOPY  09/05/2012   Procedure: COLONOSCOPY;  Surgeon: Theda Belfast, MD;  Location: [facility] ENDOSCOPY;  Service: Endoscopy;  Laterality: N/A;   COLONOSCOPY WITH PROPOFOL N/A 09/02/2015   Procedure: COLONOSCOPY WITH PROPOFOL;  Surgeon: Jeani Hawking, MD;  Location: [facility] ENDOSCOPY;  Service: Endoscopy;  Laterality: N/A;   ESOPHAGOGASTRODUODENOSCOPY (EGD) WITH PROPOFOL N/A 03/29/2017   Procedure: ESOPHAGOGASTRODUODENOSCOPY (EGD) WITH PROPOFOL;  Surgeon: Jeani Hawking, MD;  Location: [facility] ENDOSCOPY;  Service: Endoscopy;  Laterality: N/A;   TUBAL LIGATION      Allergies  Allergies  Allergen Reactions  Acetaminophen Other (See Comments)    Pt was told that she could not take Tylenol, does not know why.      EKGs/Labs/Other Studies Reviewed:   The following studies were reviewed today:  Echocardiogram 06/01/2020  IMPRESSIONS     1. Left ventricular ejection fraction, by estimation, is 60 to 65%. The  left ventricle has normal function. The left ventricle has no regional  wall motion abnormalities. There is mild concentric left ventricular  hypertrophy. Left ventricular diastolic  parameters were normal.   2. Right ventricular systolic function is normal. The right ventricular  size is normal. Tricuspid regurgitation signal is inadequate for assessing  PA pressure.   3. The mitral valve  is grossly normal. Trivial mitral valve  regurgitation. No evidence of mitral stenosis.   4. The aortic valve is tricuspid. Aortic valve regurgitation is not  visualized. No aortic stenosis is present.   Comparison(s): No significant change from prior study.   FINDINGS   Left Ventricle: Left ventricular ejection fraction, by estimation, is 60  to 65%. The left ventricle has normal function. The left ventricle has no  regional wall motion abnormalities. Definity contrast agent was given IV  to delineate the left ventricular   endocardial borders. The left ventricular internal cavity size was normal  in size. There is mild concentric left ventricular hypertrophy. Left  ventricular diastolic parameters were normal. Normal left ventricular  filling pressure.   Right Ventricle: The right ventricular size is normal. No increase in  right ventricular wall thickness. Right ventricular systolic function is  normal. Tricuspid regurgitation signal is inadequate for assessing PA  pressure.   Left Atrium: Left atrial size was normal in size.   Right Atrium: Right atrial size was normal in size.   Pericardium: Trivial pericardial effusion is present. Presence of  pericardial fat pad.   Mitral Valve: The mitral valve is grossly normal. Trivial mitral valve  regurgitation. No evidence of mitral valve stenosis.   Tricuspid Valve: The tricuspid valve is grossly normal. Tricuspid valve  regurgitation is not demonstrated. No evidence of tricuspid stenosis.   Aortic Valve: The aortic valve is tricuspid. Aortic valve regurgitation is  not visualized. No aortic stenosis is present.   Pulmonic Valve: The pulmonic valve was grossly normal. Pulmonic valve  regurgitation is trivial. No evidence of pulmonic stenosis.   Aorta: The aortic root and ascending aorta are structurally normal, with  no evidence of dilitation.   Venous: The inferior vena cava was not well visualized.   IAS/Shunts: The  atrial septum is grossly normal.   EKG:  EKG is *** ordered today.  The ekg ordered today demonstrates ***  Recent Labs: 03/15/2022: BUN 14; Creatinine, Ser 1.00; Potassium 3.9; Pro B Natriuretic peptide (BNP) 11.0; Sodium 141  Recent Lipid Panel    Component Value Date/Time   CHOL 119 12/01/2016 0205   TRIG 212 (H) 12/01/2016 0205   HDL 34 (L) 12/01/2016 0205   CHOLHDL 3.5 12/01/2016 0205   VLDL 42 (H) 12/01/2016 0205   LDLCALC 43 12/01/2016 0205    Risk Assessment/Calculations:  {Does this patient have ATRIAL FIBRILLATION?:[id number]}  Home Medications   No outpatient medications have been marked as taking for the 06/06/22 encounter (Appointment) with Sharlene Dory, PA-C.     Review of Systems   ***   All other systems reviewed and are otherwise negative except as noted above.  Physical Exam    VS:  LMP 06/04/2000 (LMP Unknown) Comment: tubal ligation , BMI There  is no height or weight on file to calculate BMI.  Wt Readings from Last 3 Encounters:  03/15/22 192 lb 9.6 oz (87.4 kg)  02/07/22 193 lb (87.5 kg)  01/11/22 191 lb 3.2 oz (86.7 kg)     GEN: Well nourished, well developed, in no acute distress. HEENT: normal. Neck: Supple, no JVD, carotid bruits, or masses. Cardiac: ***RRR, no murmurs, rubs, or gallops. No clubbing, cyanosis, edema.  ***Radials/PT 2+ and equal bilaterally.  Respiratory:  ***Respirations regular and unlabored, clear to auscultation bilaterally. GI: Soft, nontender, nondistended. MS: No deformity or atrophy. Skin: Warm and dry, no rash. Neuro:  Strength and sensation are intact. Psych: Normal affect.  Assessment & Plan    Chronic diastolic CHF Hypertension Obesity  No BP recorded.  {Refresh Note OR Click here to enter BP  :1}***      Disposition: Follow up {follow up:15908} with Armanda Magic, MD or APP.  Signed, Sharlene Dory, PA-C 06/05/2022, 12:55 PM [facility]

## 2022-06-06 ENCOUNTER — Ambulatory Visit (INDEPENDENT_AMBULATORY_CARE_PROVIDER_SITE_OTHER): Payer: Medicaid Other | Admitting: Physician Assistant

## 2022-06-06 DIAGNOSIS — E6609 Other obesity due to excess calories: Secondary | ICD-10-CM

## 2022-06-06 DIAGNOSIS — I5032 Chronic diastolic (congestive) heart failure: Secondary | ICD-10-CM

## 2022-06-06 DIAGNOSIS — I10 Essential (primary) hypertension: Secondary | ICD-10-CM

## 2022-06-07 ENCOUNTER — Other Ambulatory Visit (INDEPENDENT_AMBULATORY_CARE_PROVIDER_SITE_OTHER): Payer: Medicaid Other | Admitting: Physician Assistant

## 2022-06-07 NOTE — Progress Notes (Signed)
Erroneous encounter

## 2022-06-11 ENCOUNTER — Other Ambulatory Visit: Payer: Self-pay | Admitting: Gastroenterology

## 2022-06-11 ENCOUNTER — Other Ambulatory Visit (HOSPITAL_BASED_OUTPATIENT_CLINIC_OR_DEPARTMENT_OTHER): Payer: Self-pay | Admitting: Gastroenterology

## 2022-06-11 DIAGNOSIS — R1012 Left upper quadrant pain: Secondary | ICD-10-CM

## 2022-06-15 ENCOUNTER — Other Ambulatory Visit: Payer: Self-pay | Admitting: *Deleted

## 2022-06-15 MED ORDER — ALBUTEROL SULFATE HFA 108 (90 BASE) MCG/ACT IN AERS
2.0000 | INHALATION_SPRAY | RESPIRATORY_TRACT | 3 refills | Status: AC | PRN
Start: 2022-06-15 — End: ?

## 2022-06-18 NOTE — Progress Notes (Unsigned)
Office Visit    Patient Name: Stephanie Schultz Date of Encounter: 06/19/2022  PCP:  [name], [place]  Cardiologist:  Fransico Him, MD  Advanced Practice Provider:  No care team member to display Electrophysiologist:  None   Chief Complaint    Stephanie Schultz is a 66 y.o. female with a past medical history significant for anxiety, COPD, diabetes mellitus, gout, and hypertension presents today for hospital follow-up.  She presented to the emergency room at [facility] with chest pain and shortness of breath for 5 days.  Her vital signs were stable on 3 L of oxygen (home oxygen).  Her workup was reassuring.  She did feel better after DuoNebs were administered.  Augmentin was prescribed and she continued her home oxygen.  They did not feel that further prednisone was indicated.  The patient had plans to follow-up with her pulmonologist.  This was thought to be a COPD exacerbation.  The last time she saw Dr. Radford Pax was 07/2021.  She had a cardiac cath in 2009 showing normal coronaries and normal LVEF.  She did have recurrent chest pain and underwent cardiac catheterization 11/2016 with normal coronaries, EF 55 to 65%, normal LVEDP.  She had an acute exacerbation of her COPD at that time and her oxygen requirements at home increased.  She was doing well at that appointment and denied any chest pain or pressure.  Today, she feels okay today. She was recently in the hospital for a COPD exacerbation. She was given some antibiotics and told to continue her spirolactone and lasix for diuresis. She states she does not want to take her lasix because it does not work for her. She is only urinating twice a day sometimes and still feels like she is gaining weight without eating much. We discussed checking her renal function today before trying another diuretic. She did state she had an episode of chest pain but it doesn't happen often. She explains it as  more of a soreness in her chest and she can reproduce it by pushing on her chest wall. She did have one episode where it radiated to her arm and jaw but mostly a soreness. I've encouraged her to keep track of her weight on a daily bases. She remains SOB but this is chronic for her. She states it may be a little worse.   No edema, orthopnea, PND. Reports no palpitations.    Past Medical History    Past Medical History:  Diagnosis Date   Anemia    chronis   Anxiety    Arthritis    Cervical cancer (HCC) 1990   cervical    Chronic diastolic CHF (congestive heart failure) ([place]) 08/10/2016   COPD (chronic obstructive pulmonary disease) (HCC)    O2 dependent. Pulmo: [name]   Diabetes mellitus without complication ([place])    a. A1c 8.3 in 11/2015   Emphysema    Gallstones    s/p cholecystectomy   GERD (gastroesophageal reflux disease)    Gout    History of home oxygen therapy    "2.5L; 24/7" (08/10/2016)   Hx of cardiac catheterization    a. LHC at [facility] in [place], [place] 09/2008:  Normal coronary arteries EF 70%. b. LHC 11/2016: normal cors, normal LVEDP, EF 55-65%.   Hyperlipidemia    Hypertension    Hypoxia 11/17/2018   Morbid obesity (HCC)    OSA on CPAP    CPAP at night  Pneumonia    Sickle cell trait ([place])    Tobacco abuse    a. up to 3ppd from age 69 to 85, now 1/4 ppd (01/2013) >> Quit 10/2015   Past Surgical History:  Procedure Laterality Date   BREAST BIOPSY Right 02/23/2019   x2   BREAST BIOPSY Left 2014   CARDIAC CATHETERIZATION     CARDIAC CATHETERIZATION N/A 12/03/2016   Procedure: Left Heart Cath and Coronary Angiography;  Surgeon: Peter M Martinique, MD;  Location: [facility] CV LAB;  Service: Cardiovascular;  Laterality: N/A;   CHOLECYSTECTOMY N/A 12/13/2015   Procedure: LAPAROSCOPIC CHOLECYSTECTOMY;  Surgeon: Ralene Ok, MD;  [facility];  Service: General;  Laterality: N/A;   COLONOSCOPY  09/05/2012   Procedure: COLONOSCOPY;  Surgeon: Beryle Beams, MD;   [facility] ENDOSCOPY;  Service: Endoscopy;  Laterality: N/A;   COLONOSCOPY WITH PROPOFOL N/A 09/02/2015   Procedure: COLONOSCOPY WITH PROPOFOL;  Surgeon: Carol Ada, MD;  [facility]OS[facility];  Service: Endoscopy;  Laterality: N/A;   ESOPHAGOGASTRODUODENOSCOPY (EGD) WITH PROPOFOL N/A 03/29/2017   Procedure: ESOPHAGOGASTRODUODENOSCOPY (EGD) WITH PROPOFOL;  Surgeon: Carol Ada, MD;  [facility];  Service: Endoscopy;  Laterality: N/A;   TUBAL LIGATION      Allergies  Allergies  Allergen Reactions   Acetaminophen Other (See Comments)    Pt was told that she could not take Tylenol, does not know why.     EKGs/Labs/Other Studies Reviewed:   The following studies were reviewed today:  Echocardiogram 06/01/2020  IMPRESSIONS     1. Left ventricular ejection fraction, by estimation, is 60 to 65%. The  left ventricle has normal function. The left ventricle has no regional  wall motion abnormalities. There is mild concentric left ventricular  hypertrophy. Left ventricular diastolic  parameters were normal.   2. Right ventricular systolic function is normal. The right ventricular  size is normal. Tricuspid regurgitation signal is inadequate for assessing  PA pressure.   3. The mitral valve is grossly normal. Trivial mitral valve  regurgitation. No evidence of mitral stenosis.   4. The aortic valve is tricuspid. Aortic valve regurgitation is not  visualized. No aortic stenosis is present.   Comparison(s): No significant change from prior study.  EKG:  EKG is ordered today. NSR, no ST changes.   Recent Labs: 03/15/2022: BUN 14; Creatinine, Ser 1.00; Potassium 3.9; Pro B Natriuretic peptide (BNP) 11.0; Sodium 141  Recent Lipid Panel    Component Value Date/Time   CHOL 119 12/01/2016 0205   TRIG 212 (H) 12/01/2016 0205   HDL 34 (L) 12/01/2016 0205   CHOLHDL 3.5 12/01/2016 0205   VLDL 42 (H) 12/01/2016 0205   LDLCALC 43 12/01/2016 0205   Home Medications    Current Meds  Medication Sig   albuterol (PROVENTIL) (2.5 MG/3ML) 0.083% nebulizer solution USE 1 VIAL VIA NEBULIZER 4 TIMES DAILY AS NEEDED.   albuterol (VENTOLIN HFA) 108 (90 Base) MCG/ACT inhaler Inhale 2 puffs into the lungs every 4 (four) hours as needed for wheezing or shortness of breath.   allopurinol (ZYLOPRIM) 100 MG tablet Take 100 mg by mouth daily.   ALPRAZolam (XANAX) 1 MG tablet Take 1 mg by mouth at bedtime as needed for sleep.    aspirin EC 81 MG tablet Take 81 mg by mouth daily.   benzonatate (TESSALON) 100 MG capsule Take 2 capsules (200 mg total) by mouth 3 (three) times daily as needed for cough.   cetirizine (ZYRTEC) 10 MG tablet Take 10 mg by  mouth daily.   Cholecalciferol (VITAMIN D PO) Take 1 tablet by mouth daily.   clobetasol cream (TEMOVATE) 2.42 % Apply 1 application topically daily as needed (irritation from pull ups).    fluticasone (FLONASE) 50 MCG/ACT nasal spray Place 1 spray into both nostrils daily as needed (congestion).   Fluticasone-Umeclidin-Vilant (TRELEGY ELLIPTA) 200-62.5-25 MCG/ACT AEPB Inhale 1 puff into the lungs daily.   furosemide (LASIX) 40 MG tablet TAKE ONE TABLET EVERY MORNING and TAKE 1/2 TABLET EVERY EVENING   gabapentin (NEURONTIN) 300 MG capsule Take 300 mg by mouth 3 (three) times daily.   guaiFENesin (MUCINEX) 600 MG 12 hr tablet Take 1 tablet (600 mg total) by mouth 2 (two) times daily as needed for to loosen phlegm.   HYDROcodone-acetaminophen (NORCO) 7.5-325 MG tablet Take 1 tablet by mouth every 6 (six) hours as needed for moderate pain.   linaclotide (LINZESS) 145 MCG CAPS capsule Take 145 mcg by mouth daily.   meclizine (ANTIVERT) 25 MG tablet Take 25 mg by mouth every 6 (six) hours as needed.   metoprolol succinate (TOPROL-XL) 50 MG 24 hr tablet TAKE ONE TABLET BY MOUTH EVERY MORNING   OXYGEN Inhale 2.5 L into the lungs continuous.    pantoprazole (PROTONIX) 40 MG tablet Take 1 tablet (40 mg total) by mouth 2 (two) times daily.    potassium chloride SA (KLOR-CON M) 20 MEQ tablet TAKE ONE TABLET BY MOUTH EVERY MORNING   Respiratory Therapy Supplies (FLUTTER) DEVI 1 Device by Does not apply route 3 (three) times daily.   roflumilast (DALIRESP) 500 MCG TABS tablet TAKE 1 TABLET(500 MCG) BY MOUTH DAILY   spironolactone (ALDACTONE) 25 MG tablet Take 0.5 tablets (12.5 mg total) by mouth daily.   zolpidem (AMBIEN) 10 MG tablet Take 10 mg by mouth at bedtime.   [DISCONTINUED] esomeprazole (NEXIUM) 40 MG capsule Take 40 mg by mouth daily at 12 noon.     Review of Systems      All other systems reviewed and are otherwise negative except as noted above.  Physical Exam    VS:  BP (!) 108/58   Pulse 72   Ht 5' (1.524 m)   Wt 195 lb 6 oz (88.6 kg)   LMP 06/04/2000 (LMP Unknown) Comment: tubal ligation  SpO2 94%   BMI 38.16 kg/m  , BMI Body mass index is 38.16 kg/m.  Wt Readings from Last 3 Encounters:  06/19/22 195 lb 6 oz (88.6 kg)  03/15/22 192 lb 9.6 oz (87.4 kg)  02/07/22 193 lb (87.5 kg)     GEN: Well nourished, well developed, in no acute distress. HEENT: normal. Neck: Supple, no JVD, carotid bruits, or masses. Cardiac: RRR, no murmurs, rubs, or gallops. No clubbing, cyanosis, edema.  Radials/PT 2+ and equal bilaterally.  Respiratory:  Respirations regular and unlabored, clear to auscultation bilaterally. GI: Soft, nontender, nondistended. MS: No deformity or atrophy. Skin: Warm and dry, no rash. Neuro:  Strength and sensation are intact. Psych: Normal affect.  Assessment & Plan    COPD acute exacerbation -continue current inhaled medications -She follows with [name]  Chronic diastolic CHF -She is taking lasix '40mg'$  in the morning and '20mg'$  in the evening but she does not think its working for her so she wishes to discontinue this medication -She is on Spirolactone 12.'5mg'$  daily (consider increasing based on labs) -Will get a BMP and BNP today, patient tells me her weight continues to increase but  she isn't urinating much.  -Update Echo -daily weights  Hypertension -well controlled today -We don't have room to add Entresto  Obesity -continue exercise and diet control         Disposition: Follow up 3 months with Fransico Him, MD or APP.  Signed, Elgie Collard, PA-C 06/19/2022, 10:10 AM [place]

## 2022-06-19 ENCOUNTER — Encounter: Payer: Self-pay | Admitting: Physician Assistant

## 2022-06-19 ENCOUNTER — Ambulatory Visit (INDEPENDENT_AMBULATORY_CARE_PROVIDER_SITE_OTHER): Payer: Medicaid Other | Admitting: Physician Assistant

## 2022-06-19 VITALS — BP 108/58 | HR 72 | Ht 60.0 in | Wt 195.4 lb

## 2022-06-19 DIAGNOSIS — R0789 Other chest pain: Secondary | ICD-10-CM | POA: Diagnosis not present

## 2022-06-19 DIAGNOSIS — I10 Essential (primary) hypertension: Secondary | ICD-10-CM | POA: Diagnosis not present

## 2022-06-19 DIAGNOSIS — J449 Chronic obstructive pulmonary disease, unspecified: Secondary | ICD-10-CM

## 2022-06-19 DIAGNOSIS — I5032 Chronic diastolic (congestive) heart failure: Secondary | ICD-10-CM

## 2022-06-19 MED ORDER — PANTOPRAZOLE SODIUM 40 MG PO TBEC: 40.0000 mg | DELAYED_RELEASE_TABLET | Freq: Two times a day (BID) | ORAL | 3 refills | Status: AC

## 2022-06-19 NOTE — Patient Instructions (Signed)
Medication Instructions:  Your physician recommends that you continue on your current medications as directed. Please refer to the Current Medication list given to you today.  *If you need a refill on your cardiac medications before your next appointment, please call your pharmacy*   Lab Work: BMP and BNP today If you have labs (blood work) drawn today and your tests are completely normal, you will receive your results only by: [place] (if you have MyChart) OR A paper copy in the mail If you have any lab test that is abnormal or we need to change your treatment, we will call you to review the results.   Testing/Procedures: Your physician has requested that you have an echocardiogram. Echocardiography is a painless test that uses sound waves to create images of your heart. It provides your doctor with information about the size and shape of your heart and how well your heart's chambers and valves are working. This procedure takes approximately one hour. There are no restrictions for this procedure.    Follow-Up: At [facility], you and your health needs are our priority.  As part of our continuing mission to provide you with exceptional heart care, we have created designated Provider Care Teams.  These Care Teams include your primary Cardiologist (physician) and Advanced Practice Providers (APPs -  Physician Assistants and Nurse Practitioners) who all work together to provide you with the care you need, when you need it.  Your next appointment:   3 month(s)  The format for your next appointment:   In Person  Provider:   Fransico Him, MD  or Nicholes Rough, PA-C     {  Other Instructions Weigh yourself daily, first thing in the morning after using the bathroom and before breakfast and keep a log    Important Information About Sugar

## 2022-06-20 ENCOUNTER — Ambulatory Visit (INDEPENDENT_AMBULATORY_CARE_PROVIDER_SITE_OTHER): Payer: Medicaid Other | Admitting: Emergency Medicine

## 2022-06-20 ENCOUNTER — Encounter: Payer: Self-pay | Admitting: Emergency Medicine

## 2022-06-20 ENCOUNTER — Other Ambulatory Visit: Payer: Self-pay | Admitting: *Deleted

## 2022-06-20 ENCOUNTER — Telehealth: Payer: Self-pay

## 2022-06-20 DIAGNOSIS — K219 Gastro-esophageal reflux disease without esophagitis: Secondary | ICD-10-CM

## 2022-06-20 DIAGNOSIS — J9611 Chronic respiratory failure with hypoxia: Secondary | ICD-10-CM | POA: Diagnosis not present

## 2022-06-20 DIAGNOSIS — J441 Chronic obstructive pulmonary disease with (acute) exacerbation: Secondary | ICD-10-CM

## 2022-06-20 DIAGNOSIS — I5032 Chronic diastolic (congestive) heart failure: Secondary | ICD-10-CM

## 2022-06-20 DIAGNOSIS — J309 Allergic rhinitis, unspecified: Secondary | ICD-10-CM | POA: Diagnosis not present

## 2022-06-20 LAB — BASIC METABOLIC PANEL
BUN/Creatinine Ratio: 11 — ABNORMAL LOW (ref 12–28)
BUN: 10 mg/dL (ref 8–27)
CO2: 30 mmol/L — ABNORMAL HIGH (ref 20–29)
Calcium: 9.6 mg/dL (ref 8.7–10.3)
Chloride: 99 mmol/L (ref 96–106)
Creatinine, Ser: 0.95 mg/dL (ref 0.57–1.00)
Glucose: 127 mg/dL — ABNORMAL HIGH (ref 70–99)
Potassium: 4 mmol/L (ref 3.5–5.2)
Sodium: 143 mmol/L (ref 134–144)
eGFR: 67 mL/min/{1.73_m2} (ref >59)

## 2022-06-20 LAB — PRO B NATRIURETIC PEPTIDE: NT-Pro BNP: <36 pg/mL (ref 0–287)

## 2022-06-20 MED ORDER — TORSEMIDE 20 MG PO TABS: 20.0000 mg | ORAL_TABLET | Freq: Every day | ORAL | 3 refills | Status: DC

## 2022-06-20 NOTE — Assessment & Plan Note (Signed)
Continue your Zyrtec and fluticasone nasal spray as you have been using them. Keep Mucinex available to use if you needed to help clear mucus

## 2022-06-20 NOTE — Patient Instructions (Signed)
Please continue Trelegy 1 inhalation once daily.  Rinse and gargle after using. Continue use your albuterol nebulizer treatments twice a day. Keep your albuterol available to use either 1 nebulizer or 2 puffs if needed for shortness of breath, chest tightness, wheezing. Continue Daliresp as you have been taking it Continue oxygen at all times as you have been using it Congratulations for working with pulmonary rehab.  Keep up the good work Continue pantoprazole 40 mg twice a day.  Take this medication 1 hour around food. Continue your Pepcid each evening Continue your Zyrtec and fluticasone nasal spray as you have been using them. Keep Mucinex available to use if you needed to help clear mucus Continue your Lasix as ordered, get your echocardiogram and follow-up with cardiology as planned Follow with Dr Lamonte Sakai in 6 months or sooner if you have any problems

## 2022-06-20 NOTE — Assessment & Plan Note (Signed)
She describes chest discomfort and dyspnea.  Question whether cough or bronchospasm cause musculoskeletal chest pain.  She is being evaluated by cardiology.  Plan to continue same stable regimen  Please continue Trelegy 1 inhalation once daily.  Rinse and gargle after using. Continue use your albuterol nebulizer treatments twice a day. Keep your albuterol available to use either 1 nebulizer or 2 puffs if needed for shortness of breath, chest tightness, wheezing. Continue Daliresp as you have been taking it

## 2022-06-20 NOTE — Assessment & Plan Note (Signed)
Continue pantoprazole 40 mg twice a day.  Take this medication 1 hour around food. Continue your Pepcid each evening

## 2022-06-20 NOTE — Telephone Encounter (Signed)
Pt states she needs PA for Trelegy. Please advise.

## 2022-06-20 NOTE — Assessment & Plan Note (Signed)
Continue oxygen at all times as you have been using it

## 2022-06-20 NOTE — Progress Notes (Signed)
Subjective:    Patient ID: Stephanie Schultz, female    DOB: Sep 20, 1956, 66 y.o.   MRN: [id number]  ROV 08/24/20 --follow-up visit for 66 year old woman with very severe COPD with asthmatic features.  Also with superimposed upper airway irritation syndrome and chronic cough.  Followed for hypertension and diastolic dysfunction, obesity.  She has chronic hypoxemia on 2 to 3 L/min with documented nocturnal hypoxemia, no OSA. She reports today that she is doing pretty well. She is on daliresp for the last 2 months, on Trelegy. Has albuterol that she uses . She is on flonase, zyrtec. On esomeprazole. Her last flare was over 6 months ago. COVID vaccine up to date, Flu up to date. Needs PNA shot after age 42 PFT done today reviewed by me show very severe obstruction. Restricted volumes, decreased diffusion capacity.   She needs 6 more green e-tanks for her O2 from [employer].   ROV 06/20/22 --Stephanie Schultz is 66 former heavy smoker with a history of severe COPD with asthmatic features.  Also with some restrictive disease due to obesity, upper airway irritation syndrome with associated chronic cough.  She has hypertension with diastolic dysfunction.  She is on oxygen at 2-3 L/min at all times, has not had documented OSA. She had a flare in May 2023 that responded more so to doxycycline and prednisone.  She was in the ED with chest discomfort, cough and dyspnea in July, again treated for a suspected acute exacerbation of her COPD.  Maintenance has been with Trelegy.  Also on Daliresp, Zyrtec, Flonase as needed, Pepcid at bedtime and pantoprazole twice daily, Mucinex as needed.  Stable Lasix dosing but she has been concerned that she is gaining fluid, is not responding well to Lasix. A TTE is pending. She may get a L heart cath depending on the echo.  Today she reports that she is feeling better - chest pain is improved. She is using albuterol nebs bid. Her cough is improved. She is trying to increase her exercise. She is doing  Web designer rehab at [facility] - feels that she is benefiting.   Lung cancer screening CT chest 11/23/2021 reviewed by me showed moderate centrilobular emphysema with some left basilar scar, 5.3 mm isolated apical right upper lobe pulmonary nodule.  RADS 2 study   ROS  See history of present illness   Objective:   Physical Exam Vitals:   06/20/22 0856  BP: 126/70  Pulse: 77  Temp: 98 F (36.7 C)  TempSrc: Oral  SpO2: 96%  Weight: 195 lb 3.2 oz (88.5 kg)  Height: 5' (1.524 m)    Gen: Obese, in no distress,  normal affect  ENT: No lesions,  mouth clear,  oropharynx clear, no postnasal drip, strong voice  Neck: No JVD, no stridor  Lungs: No use of accessory muscles, distant, prolonged exp, no wheeze  Cardiovascular: RRR, heart sounds normal, no murmur or gallops, trace LE peripheral edema  Musculoskeletal: No deformities, no cyanosis or clubbing  Neuro: alert, non focal  Skin: Warm, no lesions or rashes, old scars on B UE's   Assessment & Plan:   COPD (chronic obstructive pulmonary disease) (HCC) She describes chest discomfort and dyspnea.  Question whether cough or bronchospasm cause musculoskeletal chest pain.  She is being evaluated by cardiology.  Plan to continue same stable regimen  Please continue Trelegy 1 inhalation once daily.  Rinse and gargle after using. Continue use your albuterol nebulizer treatments twice a day. Keep your albuterol available to use either 1  nebulizer or 2 puffs if needed for shortness of breath, chest tightness, wheezing. Continue Daliresp as you have been taking it   Chronic respiratory failure with hypoxia (HCC) Continue oxygen at all times as you have been using it  Chronic allergic rhinitis Continue your Zyrtec and fluticasone nasal spray as you have been using them. Keep Mucinex available to use if you needed to help clear mucus  GERD (gastroesophageal reflux disease) Continue pantoprazole 40 mg twice a day.  Take this medication 1  hour around food. Continue your Pepcid each evening  Chronic diastolic CHF (congestive heart failure) [facility]) Following with cardiology.  Echocardiogram pending, question whether they will adjust her diuretics.  Her chest pain sounded principally atypical but she may ultimately undergo left heart catheterization  [name], MD, PhD 06/20/2022, 9:23 AM [facility] [phone] or if no answer [phone]

## 2022-06-20 NOTE — Assessment & Plan Note (Signed)
Following with cardiology.  Echocardiogram pending, question whether they will adjust her diuretics.  Her chest pain sounded principally atypical but she may ultimately undergo left heart catheterization

## 2022-06-21 ENCOUNTER — Other Ambulatory Visit (HOSPITAL_COMMUNITY): Payer: Self-pay

## 2022-06-21 ENCOUNTER — Telehealth: Payer: Self-pay

## 2022-06-21 ENCOUNTER — Ambulatory Visit (HOSPITAL_COMMUNITY)
Admission: RE | Admit: 2022-06-21 | Discharge: 2022-06-21 | Disposition: A | Discharge status: Home / Self Care | Payer: Medicaid Other | Source: Ambulatory Visit | Attending: Gastroenterology | Admitting: Gastroenterology

## 2022-06-21 DIAGNOSIS — R1012 Left upper quadrant pain: Secondary | ICD-10-CM | POA: Insufficient documentation

## 2022-06-21 MED ORDER — SODIUM CHLORIDE (PF) 0.9 % IJ SOLN
INTRAMUSCULAR | Status: AC
  Filled 2022-06-21: qty 50

## 2022-06-21 MED ORDER — IOHEXOL 300 MG/ML  SOLN
100.0000 mL | Freq: Once | INTRAMUSCULAR | Status: AC | PRN
  Administered 2022-06-21: 100 mL via INTRAVENOUS

## 2022-06-21 NOTE — Telephone Encounter (Signed)
Thank you :)

## 2022-06-21 NOTE — Telephone Encounter (Signed)
Patient Advocate Encounter   Received notification that prior authorization for Trelegy 200 mcg is required.   PA submitted on 06/21/2022 via [employer] Confirmation #[id number] W Status is pending

## 2022-06-21 NOTE — Telephone Encounter (Signed)
Good morning! Prior auth for Trelegy has been submitted.

## 2022-06-23 ENCOUNTER — Other Ambulatory Visit (HOSPITAL_BASED_OUTPATIENT_CLINIC_OR_DEPARTMENT_OTHER): Payer: Medicaid Other

## 2022-06-29 ENCOUNTER — Telehealth: Payer: Self-pay | Admitting: Emergency Medicine

## 2022-06-29 NOTE — Telephone Encounter (Signed)
Called patient and left voicemail for her to call office back. Need to know if she needs refills sent in or a prior auth done for medication.

## 2022-07-02 ENCOUNTER — Other Ambulatory Visit (HOSPITAL_COMMUNITY): Payer: Self-pay

## 2022-07-02 ENCOUNTER — Telehealth: Payer: Self-pay

## 2022-07-02 NOTE — Telephone Encounter (Signed)
Stephanie Schultz, CPhT  You 30 minutes ago (11:26 AM)    Prior auth submitted via [employer] on 07/02/22. Will update encounter one response is received.     Called and spoke with pt letting her know that the prior auth had been started and told her that we would let her know once we have an update and she verbalized understanding.

## 2022-07-02 NOTE — Telephone Encounter (Signed)
Patient Advocate Encounter   Received notification that prior authorization is required for Roflumilast [place])  Submitted to [employer] on 07/02/2022 Confirmation: [id number] Lauralee Evener, CPhT Rx Patient Advocate Specialist Phone: [phone]

## 2022-07-02 NOTE — Telephone Encounter (Signed)
Patient states needs refill and prior authorization for Daliresp. Patient is out of medication. Pharmacy is [place]. Patient phone number is [phone].

## 2022-07-02 NOTE — Telephone Encounter (Signed)
Called and spoke with pt letting her know that the PA was approved and she should be able to get her Daliresp from the pharmacy. Pt verbalized understanding. Nothing further needed.

## 2022-07-02 NOTE — Telephone Encounter (Signed)
Patient Advocate Encounter  Prior Authorization for Roflumilast has been approved.   Effective: 07/02/2022 to 07/02/2023  Clista Bernhardt, CPhT Rx Patient Advocate Specialist Phone: [phone]

## 2022-07-02 NOTE — Telephone Encounter (Signed)
Refill of medication was sent 05/30/22 with 11 additional refills.  Routing to prior auth team to check to see if a prior Josem Kaufmann is needed.

## 2022-07-03 ENCOUNTER — Ambulatory Visit (HOSPITAL_COMMUNITY): Payer: Medicaid Other

## 2022-07-17 ENCOUNTER — Ambulatory Visit (HOSPITAL_COMMUNITY): Payer: Medicaid Other | Attending: Physician Assistant

## 2022-07-17 DIAGNOSIS — I5032 Chronic diastolic (congestive) heart failure: Secondary | ICD-10-CM | POA: Diagnosis not present

## 2022-07-17 LAB — ECHOCARDIOGRAM COMPLETE
Area-P 1/2: 3.91 cm2
S' Lateral: 2.5 cm

## 2022-07-17 MED ORDER — PERFLUTREN LIPID MICROSPHERE
1.0000 mL | INTRAVENOUS | Status: AC | PRN
  Administered 2022-07-17: 1 mL via INTRAVENOUS

## 2022-07-19 ENCOUNTER — Other Ambulatory Visit: Payer: Self-pay | Admitting: Cardiology

## 2022-07-20 NOTE — Telephone Encounter (Signed)
Patient Advocate Encounter  Prior Authorization for Trelegy 200 mcg  has been approved.    PA# [id number] Effective dates: 08/10/02023 through 06/21/2023

## 2022-08-17 ENCOUNTER — Other Ambulatory Visit: Payer: Self-pay | Admitting: Internal Medicine

## 2022-08-17 DIAGNOSIS — Z1231 Encounter for screening mammogram for malignant neoplasm of breast: Secondary | ICD-10-CM

## 2022-09-17 NOTE — Progress Notes (Unsigned)
Office Visit    Patient Name: Stephanie Schultz Date of Encounter: 09/17/2022  PCP:  [name], [place]  Cardiologist:  Fransico Him, MD  Advanced Practice Provider:  No care team member to display Electrophysiologist:  None   Chief Complaint    Stephanie Schultz is a 66 y.o. female with a past medical history significant for anxiety, COPD, diabetes mellitus, gout, and hypertension presents today for hospital follow-up.  She presented to the emergency room at [place] [facility] with chest pain and shortness of breath for 5 days.  Her vital signs were stable on 3 L of oxygen (home oxygen).  Her workup was reassuring.  She did feel better after DuoNebs were administered.  Augmentin was prescribed and she continued her home oxygen.  They did not feel that further prednisone was indicated.  The patient had plans to follow-up with her pulmonologist.  This was thought to be a COPD exacerbation.  The last time she saw Dr. Radford Pax was 07/2021.  She had a cardiac cath in 2009 showing normal coronaries and normal LVEF.  She did have recurrent chest pain and underwent cardiac catheterization 11/2016 with normal coronaries, EF 55 to 65%, normal LVEDP.  She had an acute exacerbation of her COPD at that time and her oxygen requirements at home increased.  She was doing well at that appointment and denied any chest pain or pressure.  Last seen by me 8/8/202 and she was feeling okay. She was recently in the hospital for a COPD exacerbation. She was given some antibiotics and told to continue her spirolactone and lasix for diuresis. She states she does not want to take her lasix because it does not work for her. She is only urinating twice a day sometimes and still feels like she is gaining weight without eating much. We discussed checking her renal function today before trying another diuretic. She did state she had an episode of chest pain but it doesn't happen  often. She explains it as more of a soreness in her chest and she can reproduce it by pushing on her chest wall. She did have one episode where it radiated to her arm and jaw but mostly a soreness. I've encouraged her to keep track of her weight on a daily bases. She remains SOB but this is chronic for her. She states it may be a little worse.   Today, she ***  Past Medical History    Past Medical History:  Diagnosis Date   Anemia    chronis   Anxiety    Arthritis    Cervical cancer (HCC) 1990   cervical    Chronic diastolic CHF (congestive heart failure) ([place]) 08/10/2016   COPD (chronic obstructive pulmonary disease) (HCC)    O2 dependent. Pulmo: [name]   Diabetes mellitus without complication ([place])    a. A1c 8.3 in 11/2015   Emphysema    Gallstones    s/p cholecystectomy   GERD (gastroesophageal reflux disease)    Gout    History of home oxygen therapy    "2.5L; 24/7" (08/10/2016)   Hx of cardiac catheterization    a. LHC at [facility] [place] [place] 09/2008:  Normal coronary arteries EF 70%. b. LHC 11/2016: normal cors, normal LVEDP, EF 55-65%.   Hyperlipidemia    Hypertension    Hypoxia 11/17/2018   Morbid obesity (HCC)    OSA on CPAP    CPAP at night  Pneumonia    Sickle cell trait ([place])    Tobacco abuse    a. up to 3ppd from age 45 to 69, now 1/4 ppd (01/2013) >> Quit 10/2015   Past Surgical History:  Procedure Laterality Date   BREAST BIOPSY Right 02/23/2019   x2   BREAST BIOPSY Left 2014   CARDIAC CATHETERIZATION     CARDIAC CATHETERIZATION N/A 12/03/2016   Procedure: Left Heart Cath and Coronary Angiography;  Surgeon: Peter M Martinique, MD;  Location: [place];  Service: Cardiovascular;  Laterality: N/A;   CHOLECYSTECTOMY N/A 12/13/2015   Procedure: LAPAROSCOPIC CHOLECYSTECTOMY;  Surgeon: Ralene Ok, MD;  Location: [facility];  Service: General;  Laterality: N/A;   COLONOSCOPY  09/05/2012   Procedure: COLONOSCOPY;  Surgeon: Beryle Beams, MD;  Location: [facility]  ENDOSCOPY;  Service: Endoscopy;  Laterality: N/A;   COLONOSCOPY WITH PROPOFOL N/A 09/02/2015   Procedure: COLONOSCOPY WITH PROPOFOL;  Surgeon: Carol Ada, MD;  Location: [facility] ENDOSCOPY;  Service: Endoscopy;  Laterality: N/A;   ESOPHAGOGASTRODUODENOSCOPY (EGD) WITH PROPOFOL N/A 03/29/2017   Procedure: ESOPHAGOGASTRODUODENOSCOPY (EGD) WITH PROPOFOL;  Surgeon: Carol Ada, MD;  Location: [facility] ENDOSCOPY;  Service: Endoscopy;  Laterality: N/A;   TUBAL LIGATION      Allergies  Allergies  Allergen Reactions   Acetaminophen Other (See Comments)    Pt was told that she could not take Tylenol, does not know why.     EKGs/Labs/Other Studies Reviewed:   The following studies were reviewed today:  Echocardiogram 07/17/2022  IMPRESSIONS     1. Left ventricular ejection fraction, by estimation, is 60 to 65%. The  left ventricle has normal function. The left ventricle has no regional  wall motion abnormalities. Left ventricular diastolic parameters were  normal.   2. Right ventricular systolic function is normal. The right ventricular  size is normal. Tricuspid regurgitation signal is inadequate for assessing  PA pressure.   3. The mitral valve is grossly normal. Trivial mitral valve  regurgitation. No evidence of mitral stenosis.   4. The aortic valve is tricuspid. There is mild calcification of the  aortic valve. Aortic valve regurgitation is not visualized. No aortic  stenosis is present.   Comparison(s): No significant change from prior study. 06/01/20 EF 60-65%.   FINDINGS   Left Ventricle: Left ventricular ejection fraction, by estimation, is 60  to 65%. The left ventricle has normal function. The left ventricle has no  regional wall motion abnormalities. Definity contrast agent was given IV  to delineate the left ventricular   endocardial borders. The left ventricular internal cavity size was normal  in size. There is no left ventricular hypertrophy. Left ventricular  diastolic  parameters were normal.   Right Ventricle: The right ventricular size is normal. No increase in  right ventricular wall thickness. Right ventricular systolic function is  normal. Tricuspid regurgitation signal is inadequate for assessing PA  pressure.   Left Atrium: Left atrial size was normal in size.   Right Atrium: Right atrial size was normal in size.   Pericardium: Trivial pericardial effusion is present.   Mitral Valve: The mitral valve is grossly normal. Trivial mitral valve  regurgitation. No evidence of mitral valve stenosis.   Tricuspid Valve: The tricuspid valve is grossly normal. Tricuspid valve  regurgitation is trivial. No evidence of tricuspid stenosis.   Aortic Valve: The aortic valve is tricuspid. There is mild calcification  of the aortic valve. Aortic valve regurgitation is not visualized. No  aortic stenosis is present.  Pulmonic Valve: The pulmonic valve was grossly normal. Pulmonic valve  regurgitation is trivial. No evidence of pulmonic stenosis.   Aorta: The aortic root and ascending aorta are structurally normal, with  no evidence of dilitation.   Venous: The inferior vena cava was not well visualized.   IAS/Shunts: The atrial septum is grossly normal.   Echocardiogram 06/01/2020  IMPRESSIONS     1. Left ventricular ejection fraction, by estimation, is 60 to 65%. The  left ventricle has normal function. The left ventricle has no regional  wall motion abnormalities. There is mild concentric left ventricular  hypertrophy. Left ventricular diastolic  parameters were normal.   2. Right ventricular systolic function is normal. The right ventricular  size is normal. Tricuspid regurgitation signal is inadequate for assessing  PA pressure.   3. The mitral valve is grossly normal. Trivial mitral valve  regurgitation. No evidence of mitral stenosis.   4. The aortic valve is tricuspid. Aortic valve regurgitation is not  visualized. No aortic stenosis is  present.   Comparison(s): No significant change from prior study.  EKG:  EKG is ordered today. NSR, no ST changes.   Recent Labs: 06/19/2022: BUN 10; Creatinine, Ser 0.95; NT-Pro BNP <36; Potassium 4.0; Sodium 143  Recent Lipid Panel    Component Value Date/Time   CHOL 119 12/01/2016 0205   TRIG 212 (H) 12/01/2016 0205   HDL 34 (L) 12/01/2016 0205   CHOLHDL 3.5 12/01/2016 0205   VLDL 42 (H) 12/01/2016 0205   LDLCALC 43 12/01/2016 0205   Home Medications   No outpatient medications have been marked as taking for the 09/19/22 encounter (Appointment) with Elgie Collard, PA-C.     Review of Systems      All other systems reviewed and are otherwise negative except as noted above.  Physical Exam    VS:  LMP 06/04/2000 (LMP Unknown) Comment: tubal ligation , BMI There is no height or weight on file to calculate BMI.  Wt Readings from Last 3 Encounters:  06/20/22 195 lb 3.2 oz (88.5 kg)  06/19/22 195 lb 6 oz (88.6 kg)  03/15/22 192 lb 9.6 oz (87.4 kg)     GEN: Well nourished, well developed, in no acute distress. HEENT: normal. Neck: Supple, no JVD, carotid bruits, or masses. Cardiac: RRR, no murmurs, rubs, or gallops. No clubbing, cyanosis, edema.  Radials/PT 2+ and equal bilaterally.  Respiratory:  Respirations regular and unlabored, clear to auscultation bilaterally. GI: Soft, nontender, nondistended. MS: No deformity or atrophy. Skin: Warm and dry, no rash. Neuro:  Strength and sensation are intact. Psych: Normal affect.  Assessment & Plan    COPD acute exacerbation -continue current inhaled medications -She follows with [name]  Chronic diastolic CHF -She is taking lasix '40mg'$  in the morning and '20mg'$  in the evening but she does not think its working for her so she wishes to discontinue this medication -She is on Spirolactone 12.'5mg'$  daily  -normal kidney function on labs today, will switch diuretics to Demedex 20 mg daily  -echo updated 9/5, reassuring study,  normal EF -daily weights  Hypertension -well controlled today -We don't have room to add Entresto  Obesity -continue exercise and diet control  No BP recorded.  {Refresh Note OR Click here to enter BP  :1}***      Disposition: Follow up 3 months with Fransico Him, MD or APP.  Signed, Elgie Collard, PA-C 09/17/2022, 9:47 AM [place]

## 2022-09-19 ENCOUNTER — Ambulatory Visit: Payer: Medicaid Other | Admitting: Physician Assistant

## 2022-09-19 DIAGNOSIS — E6609 Other obesity due to excess calories: Secondary | ICD-10-CM

## 2022-09-19 DIAGNOSIS — I5032 Chronic diastolic (congestive) heart failure: Secondary | ICD-10-CM

## 2022-09-19 DIAGNOSIS — I10 Essential (primary) hypertension: Secondary | ICD-10-CM

## 2022-09-19 DIAGNOSIS — J449 Chronic obstructive pulmonary disease, unspecified: Secondary | ICD-10-CM

## 2022-09-26 ENCOUNTER — Telehealth: Payer: Self-pay | Admitting: Emergency Medicine

## 2022-09-26 DIAGNOSIS — J441 Chronic obstructive pulmonary disease with (acute) exacerbation: Secondary | ICD-10-CM

## 2022-09-26 NOTE — Telephone Encounter (Signed)
Patient called to request a new nebulizer which is not working.  Patient stated it has been about 2 days since she had a treatment.  She would like to get this sent in asap.  CB# [phone]

## 2022-09-27 NOTE — Telephone Encounter (Signed)
Yes, just send to me and I will sign

## 2022-09-27 NOTE — Telephone Encounter (Signed)
Thanks

## 2022-09-27 NOTE — Telephone Encounter (Signed)
Spoke with pt who states nebulizer machine stopped working yesterday. Pt talked to [employer] who states pt would need a new order to replace machine. Beth would you sign urgent nebulizer order? Order is already placed.

## 2022-09-27 NOTE — Telephone Encounter (Signed)
Order is already placed under you. It's a AMB DME order

## 2022-10-11 ENCOUNTER — Ambulatory Visit: Payer: Medicaid Other | Admitting: Physician Assistant

## 2022-10-12 ENCOUNTER — Ambulatory Visit: Payer: Medicaid Other

## 2022-10-15 ENCOUNTER — Telehealth: Payer: Self-pay | Admitting: Pulmonary Disease

## 2022-10-15 MED ORDER — PREDNISONE 20 MG PO TABS: 40.0000 mg | ORAL_TABLET | Freq: Every day | ORAL | 0 refills | Status: DC

## 2022-10-15 MED ORDER — AZITHROMYCIN 250 MG PO TABS: ORAL_TABLET | ORAL | 0 refills | Status: DC

## 2022-10-15 NOTE — Telephone Encounter (Signed)
Pt called the office stating that she had a episode at rehab. States that symptoms have been going on for about 2 weeks including increased SOB, coughing getting up yellow phlegm that might have some green tint to it. Pt also states she has had some wheezing.  Pt said today when she was leaving rehab, it hit her hard to where they gave her a neb treatment due to the episode that happened.  Pt is requesting to have meds called to the pharmacy to help with her symptoms.  Dr. Vaughan Browner, please advise.

## 2022-10-15 NOTE — Telephone Encounter (Signed)
Please call in a Z-Pak and prednisone 40 mg a day for 5 days

## 2022-10-15 NOTE — Telephone Encounter (Signed)
Called and spoke with pt letting her know recs per Dr. Vaughan Browner and she verbalized understanding. Meds have been sent to preferred pharmacy. Nothing further needed.

## 2022-10-16 ENCOUNTER — Ambulatory Visit: Payer: Medicaid Other | Admitting: Physician Assistant

## 2022-10-30 ENCOUNTER — Other Ambulatory Visit: Payer: Self-pay | Admitting: *Deleted

## 2022-10-30 ENCOUNTER — Ambulatory Visit (INDEPENDENT_AMBULATORY_CARE_PROVIDER_SITE_OTHER): Payer: Medicaid Other | Admitting: Emergency Medicine

## 2022-10-30 ENCOUNTER — Telehealth: Payer: Self-pay | Admitting: Physician Assistant

## 2022-10-30 ENCOUNTER — Encounter: Payer: Self-pay | Admitting: Emergency Medicine

## 2022-10-30 ENCOUNTER — Other Ambulatory Visit: Payer: Self-pay

## 2022-10-30 VITALS — BP 128/66 | HR 75 | Temp 98.1°F | Ht 60.0 in | Wt 191.0 lb

## 2022-10-30 DIAGNOSIS — I5032 Chronic diastolic (congestive) heart failure: Secondary | ICD-10-CM

## 2022-10-30 DIAGNOSIS — K219 Gastro-esophageal reflux disease without esophagitis: Secondary | ICD-10-CM

## 2022-10-30 DIAGNOSIS — J9611 Chronic respiratory failure with hypoxia: Secondary | ICD-10-CM

## 2022-10-30 DIAGNOSIS — J441 Chronic obstructive pulmonary disease with (acute) exacerbation: Secondary | ICD-10-CM | POA: Diagnosis not present

## 2022-10-30 DIAGNOSIS — R0602 Shortness of breath: Secondary | ICD-10-CM | POA: Diagnosis not present

## 2022-10-30 DIAGNOSIS — J309 Allergic rhinitis, unspecified: Secondary | ICD-10-CM

## 2022-10-30 MED ORDER — BENZONATATE 100 MG PO CAPS: 200.0000 mg | ORAL_CAPSULE | Freq: Three times a day (TID) | ORAL | 1 refills | Status: DC | PRN

## 2022-10-30 MED ORDER — FUROSEMIDE 40 MG PO TABS: ORAL_TABLET | ORAL | 3 refills | Status: AC

## 2022-10-30 NOTE — Telephone Encounter (Signed)
Patient showed up a couple days early and wanted to reschedule due to living so far away. She said February would be better so she doesn't get any appointments confused again.  She wants to change fluid pill from new med's back to her old one. Patient said this is a good number to contact her at, [phone].

## 2022-10-30 NOTE — Assessment & Plan Note (Signed)
Continue oxygen 2-3 L/min at all times

## 2022-10-30 NOTE — Assessment & Plan Note (Signed)
Continue same regimen 

## 2022-10-30 NOTE — Progress Notes (Signed)
  Subjective:    Patient ID: Stephanie Schultz, female    DOB: 1955-11-26, 66 y.o.   MRN: [id number]   [place] 10/30/22 --follow-up visit 66 year old woman with severe COPD with asthmatic features, obesity with associated restrictive lung disease.  She also has upper airway irritation syndrome and chronic cough, hypertension with diastolic dysfunction.  She is on oxygen 2-3 L/min. Managed on Zyrtec, Flonase as needed, Pepcid, Trelegy, Daliresp, pantoprazole twice daily.  She was treated w demadex for several months, but was just changed back to Lasix 40 mg in the morning, 20 mg in the evening. She has been doing pulm rehab. She has developed some increased exertional SOB for 2 weeks. She was treated with pred and azithro 2 weeks ago. She feels an acute blockage in her UA. She is having daily cough, he's benefited from tessalon before.    ROS  See history of present illness   Objective:   Physical Exam Vitals:   10/30/22 1005  BP: 128/66  Pulse: 75  Temp: 98.1 F (36.7 C)  TempSrc: Oral  SpO2: 99%  Weight: 191 lb (86.6 kg)  Height: 5' (1.524 m)    Gen: Obese, in no distress,  normal affect  ENT: No lesions,  mouth clear,  oropharynx clear, no postnasal drip, strong voice  Neck: No JVD, no stridor  Lungs: No use of accessory muscles, distant, prolonged exp, no wheeze  Cardiovascular: RRR, heart sounds normal, no murmur or gallops, no peripheral edema  Musculoskeletal: No deformities, no cyanosis or clubbing  Neuro: alert, non focal  Skin: Warm, no lesions or rashes, old scars on B UE's   Assessment & Plan:   Dyspnea Multifactorial dyspnea.  I do not hear any evidence of wheezing, stridor to support an acute flare.  Suspect her restrictive disease, intermittent upper airway obstruction, deconditioning.  She did benefit from prednisone and azithromycin about 2 weeks ago.  COPD (chronic obstructive pulmonary disease) (HCC) No wheeze or evidence of acute flare on exam today.  Plan  to continue her Trelegy, Daliresp, albuterol as needed.  Defer corticosteroids at this time.  Chronic diastolic CHF (congestive heart failure) ([place]) She has been on Demadex for last several months, was just changed back to Lasix due to questionable side effects.  Will need to watch her volume status closely as this has impacted her breathing.  Chronic respiratory failure with hypoxia (HCC) Continue oxygen 2-3 L/min at all times  GERD (gastroesophageal reflux disease) Continue same regimen  Chronic allergic rhinitis Continue same regimen   [name], MD, PhD 10/30/2022, 10:56 AM [facility] [phone] or if no answer [phone]

## 2022-10-30 NOTE — Assessment & Plan Note (Signed)
She has been on Demadex for last several months, was just changed back to Lasix due to questionable side effects.  Will need to watch her volume status closely as this has impacted her breathing.

## 2022-10-30 NOTE — Telephone Encounter (Signed)
Spoke with patient and made her aware of med change below. Sent in new rx to patients preferred pharmacy. She will continue to weigh herself daily and call us with any further questions or concerns.

## 2022-10-30 NOTE — Patient Instructions (Addendum)
Continue Trelegy 1 elation once daily.  Rinse and gargle after using. Keep your albuterol available to use 2 puffs or 1 nebulizer treatment up to every 4 hours if needed for shortness of breath, chest tightness, wheezing. Continue Daliresp once daily Continue oxygen at all times as you have been using it. Continue Zyrtec, Flonase as needed Continue Pepcid and pantoprazole as you have been taking them Keep track of how your breathing does with the change from Demadex back to Lasix.  Your your overall fluid status can impact your breathing. We will refill your Tessalon [place] Follow with Dr Lamonte Sakai in 6 months or sooner if you have any problems

## 2022-10-30 NOTE — Assessment & Plan Note (Addendum)
No wheeze or evidence of acute flare on exam today.  Plan to continue her Trelegy, Daliresp, albuterol as needed.  Defer corticosteroids at this time.

## 2022-10-30 NOTE — Assessment & Plan Note (Signed)
Multifactorial dyspnea.  I do not hear any evidence of wheezing, stridor to support an acute flare.  Suspect her restrictive disease, intermittent upper airway obstruction, deconditioning.  She did benefit from prednisone and azithromycin about 2 weeks ago.

## 2022-11-02 ENCOUNTER — Ambulatory Visit: Payer: Medicaid Other | Admitting: Physician Assistant

## 2022-11-20 ENCOUNTER — Other Ambulatory Visit: Payer: Self-pay | Admitting: Gastroenterology

## 2022-11-23 ENCOUNTER — Other Ambulatory Visit: Payer: Self-pay | Admitting: Acute Care

## 2022-11-23 ENCOUNTER — Inpatient Hospital Stay: Admission: RE | Admit: 2022-11-23 | Payer: Medicaid Other | Source: Ambulatory Visit

## 2022-11-23 ENCOUNTER — Encounter: Payer: Self-pay | Admitting: Acute Care

## 2022-11-23 DIAGNOSIS — Z87891 Personal history of nicotine dependence: Secondary | ICD-10-CM

## 2022-12-03 ENCOUNTER — Other Ambulatory Visit: Payer: Self-pay | Admitting: Emergency Medicine

## 2022-12-10 ENCOUNTER — Ambulatory Visit
Admission: RE | Admit: 2022-12-10 | Discharge: 2022-12-10 | Disposition: A | Discharge status: Home / Self Care | Payer: Medicaid Other | Source: Ambulatory Visit | Attending: Internal Medicine | Admitting: Internal Medicine

## 2022-12-10 DIAGNOSIS — Z1231 Encounter for screening mammogram for malignant neoplasm of breast: Secondary | ICD-10-CM

## 2022-12-18 NOTE — Progress Notes (Unsigned)
Office Visit    Patient Name: Stephanie Schultz Date of Encounter: 12/19/2022  PCP:  [name], MD   [place]  Cardiologist:  Fransico Him, MD  Advanced Practice Provider:  No care team member to display Electrophysiologist:  None   Chief Complaint    Stephanie Schultz is a 66 y.o. female with a past medical history significant for anxiety, COPD, diabetes mellitus, gout, and hypertension presents today for hospital follow-up.  She presented to the emergency room at [facility] with chest pain and shortness of breath for 5 days.  Her vital signs were stable on 3 L of oxygen (home oxygen).  Her workup was reassuring.  She did feel better after DuoNebs were administered.  Augmentin was prescribed and she continued her home oxygen.  They did not feel that further prednisone was indicated.  The patient had plans to follow-up with her pulmonologist.  This was thought to be a COPD exacerbation.  The last time she saw Dr. Radford Pax was 07/2021.  She had a cardiac cath in 2009 showing normal coronaries and normal LVEF.  She did have recurrent chest pain and underwent cardiac catheterization 11/2016 with normal coronaries, EF 55 to 65%, normal LVEDP.  She had an acute exacerbation of her COPD at that time and her oxygen requirements at home increased.  She was doing well at that appointment and denied any chest pain or pressure.  Last seen by me 8/8/202 and she was feeling okay. She was recently in the hospital for a COPD exacerbation. She was given some antibiotics and told to continue her spirolactone and lasix for diuresis. She states she does not want to take her lasix because it does not work for her. She is only urinating twice a day sometimes and still feels like she is gaining weight without eating much. We discussed checking her renal function today before trying another diuretic. She did state she had an episode of chest pain but it doesn't happen  often. She explains it as more of a soreness in her chest and she can reproduce it by pushing on her chest wall. She did have one episode where it radiated to her arm and jaw but mostly a soreness. I've encouraged her to keep track of her weight on a daily bases. She remains SOB but this is chronic for her. She states it may be a little worse.   Today, she states that her weight has been up and down. She does admit to eating too much fatty/greasy chicken.  She has been trying to drink water but also has been drinking more Pepsi.  She tells me that she is only using the bathroom 1-2 times a day despite being on lasix. She states that her urine has a bad smell but does not have any burning or discomfort with urination. She sees her PCP in a few days and Ive suggested bringing it up with them and having them order a UA. She states she just had one done at her GYN office but we do not have those records since they are not a [facility] facility. She is worried about her kidneys so we will get some labs today.  Reports no chest pain, pressure, or tightness. No edema, orthopnea, PND. Reports no palpitations.    Past Medical History    Past Medical History:  Diagnosis Date   Anemia    chronis   Anxiety    Arthritis    Cervical  cancer ([place]) 1990   cervical    Chronic diastolic CHF (congestive heart failure) ([place]) 08/10/2016   COPD (chronic obstructive pulmonary disease) (HCC)    O2 dependent. Pulmo: [name]   Diabetes mellitus without complication ([place])    a. A1c 8.3 in 11/2015   Emphysema    Gallstones    s/p cholecystectomy   GERD (gastroesophageal reflux disease)    Gout    History of home oxygen therapy    "2.5L; 24/7" (08/10/2016)   Hx of cardiac catheterization    a. LHC at [facility] in [place], [place] 09/2008:  Normal coronary arteries EF 70%. b. LHC 11/2016: normal cors, normal LVEDP, EF 55-65%.   Hyperlipidemia    Hypertension    Hypoxia 11/17/2018   Morbid obesity (HCC)    OSA on CPAP    CPAP at  night    Pneumonia    Sickle cell trait ([place])    Tobacco abuse    a. up to 3ppd from age 73 to 76, now 1/4 ppd (01/2013) >> Quit 10/2015   Past Surgical History:  Procedure Laterality Date   BREAST BIOPSY Right 02/23/2019   x2   BREAST BIOPSY Left 2014   CARDIAC CATHETERIZATION     CARDIAC CATHETERIZATION N/A 12/03/2016   Procedure: Left Heart Cath and Coronary Angiography;  Surgeon: Peter M Martinique, MD;  Location: [facility];  Service: Cardiovascular;  Laterality: N/A;   CHOLECYSTECTOMY N/A 12/13/2015   Procedure: LAPAROSCOPIC CHOLECYSTECTOMY;  Surgeon: Ralene Ok, MD;  Location: [facility];  Service: General;  Laterality: N/A;   COLONOSCOPY  09/05/2012   Procedure: COLONOSCOPY;  Surgeon: Beryle Beams, MD;  Location: [facility];  Service: Endoscopy;  Laterality: N/A;   COLONOSCOPY WITH PROPOFOL N/A 09/02/2015   Procedure: COLONOSCOPY WITH PROPOFOL;  Surgeon: Carol Ada, MD;  Location: [facility];  Service: Endoscopy;  Laterality: N/A;   ESOPHAGOGASTRODUODENOSCOPY (EGD) WITH PROPOFOL N/A 03/29/2017   Procedure: ESOPHAGOGASTRODUODENOSCOPY (EGD) WITH PROPOFOL;  Surgeon: Carol Ada, MD;  Location: [facility];  Service: Endoscopy;  Laterality: N/A;   TUBAL LIGATION      Allergies  Allergies  Allergen Reactions   Acetaminophen Other (See Comments)    Pt was told that she could not take Tylenol, does not know why.     EKGs/Labs/Other Studies Reviewed:   The following studies were reviewed today:  Echocardiogram 07/17/2022  IMPRESSIONS     1. Left ventricular ejection fraction, by estimation, is 60 to 65%. The  left ventricle has normal function. The left ventricle has no regional  wall motion abnormalities. Left ventricular diastolic parameters were  normal.   2. Right ventricular systolic function is normal. The right ventricular  size is normal. Tricuspid regurgitation signal is inadequate for assessing  PA pressure.   3. The mitral valve is grossly  normal. Trivial mitral valve  regurgitation. No evidence of mitral stenosis.   4. The aortic valve is tricuspid. There is mild calcification of the  aortic valve. Aortic valve regurgitation is not visualized. No aortic  stenosis is present.   Comparison(s): No significant change from prior study. 06/01/20 EF 60-65%.   FINDINGS   Left Ventricle: Left ventricular ejection fraction, by estimation, is 60  to 65%. The left ventricle has normal function. The left ventricle has no  regional wall motion abnormalities. Definity contrast agent was given IV  to delineate the left ventricular   endocardial borders. The left ventricular internal cavity size was normal  in size. There is no left  ventricular hypertrophy. Left ventricular  diastolic parameters were normal.   Right Ventricle: The right ventricular size is normal. No increase in  right ventricular wall thickness. Right ventricular systolic function is  normal. Tricuspid regurgitation signal is inadequate for assessing PA  pressure.   Left Atrium: Left atrial size was normal in size.   Right Atrium: Right atrial size was normal in size.   Pericardium: Trivial pericardial effusion is present.   Mitral Valve: The mitral valve is grossly normal. Trivial mitral valve  regurgitation. No evidence of mitral valve stenosis.   Tricuspid Valve: The tricuspid valve is grossly normal. Tricuspid valve  regurgitation is trivial. No evidence of tricuspid stenosis.   Aortic Valve: The aortic valve is tricuspid. There is mild calcification  of the aortic valve. Aortic valve regurgitation is not visualized. No  aortic stenosis is present.   Pulmonic Valve: The pulmonic valve was grossly normal. Pulmonic valve  regurgitation is trivial. No evidence of pulmonic stenosis.   Aorta: The aortic root and ascending aorta are structurally normal, with  no evidence of dilitation.   Venous: The inferior vena cava was not well visualized.   IAS/Shunts:  The atrial septum is grossly normal.   Echocardiogram 06/01/2020  IMPRESSIONS     1. Left ventricular ejection fraction, by estimation, is 60 to 65%. The  left ventricle has normal function. The left ventricle has no regional  wall motion abnormalities. There is mild concentric left ventricular  hypertrophy. Left ventricular diastolic  parameters were normal.   2. Right ventricular systolic function is normal. The right ventricular  size is normal. Tricuspid regurgitation signal is inadequate for assessing  PA pressure.   3. The mitral valve is grossly normal. Trivial mitral valve  regurgitation. No evidence of mitral stenosis.   4. The aortic valve is tricuspid. Aortic valve regurgitation is not  visualized. No aortic stenosis is present.   Comparison(s): No significant change from prior study.  EKG:  EKG is ordered today. NSR, no ST changes.   Recent Labs: 06/19/2022: BUN 10; Creatinine, Ser 0.95; NT-Pro BNP <36; Potassium 4.0; Sodium 143  Recent Lipid Panel    Component Value Date/Time   CHOL 119 12/01/2016 0205   TRIG 212 (H) 12/01/2016 0205   HDL 34 (L) 12/01/2016 0205   CHOLHDL 3.5 12/01/2016 0205   VLDL 42 (H) 12/01/2016 0205   LDLCALC 43 12/01/2016 0205   Home Medications   Current Meds  Medication Sig   albuterol (PROVENTIL) (2.5 MG/3ML) 0.083% nebulizer solution USE 1 VIAL VIA NEBULIZER 4 TIMES DAILY AS NEEDED.   albuterol (VENTOLIN HFA) 108 (90 Base) MCG/ACT inhaler Inhale 2 puffs into the lungs every 4 (four) hours as needed for wheezing or shortness of breath.   allopurinol (ZYLOPRIM) 100 MG tablet Take 100 mg by mouth daily.   ALPRAZolam (XANAX) 1 MG tablet Take 1 mg by mouth at bedtime as needed for sleep.    aspirin EC 81 MG tablet Take 81 mg by mouth daily.   benzonatate (TESSALON) 100 MG capsule Take 2 capsules (200 mg total) by mouth 3 (three) times daily as needed for cough.   cetirizine (ZYRTEC) 10 MG tablet Take 10 mg by mouth daily.   Cholecalciferol  (VITAMIN D PO) Take 1 tablet by mouth daily.   clobetasol cream (TEMOVATE) 3.71 % Apply 1 application topically daily as needed (irritation from pull ups).    fluticasone (FLONASE) 50 MCG/ACT nasal spray Place 1 spray into both nostrils daily as needed (congestion).  furosemide (LASIX) 40 MG tablet Take 40 mg (1 tablet) by mouth in the morning and 20 mg (1/2 tablet) by mouth in the evening   gabapentin (NEURONTIN) 300 MG capsule Take 300 mg by mouth 3 (three) times daily.   guaiFENesin (MUCINEX) 600 MG 12 hr tablet Take 1 tablet (600 mg total) by mouth 2 (two) times daily as needed for to loosen phlegm.   HYDROcodone-acetaminophen (NORCO) 7.5-325 MG tablet Take 1 tablet by mouth every 6 (six) hours as needed for moderate pain.   linaclotide (LINZESS) 145 MCG CAPS capsule Take 145 mcg by mouth daily.   metoprolol succinate (TOPROL-XL) 50 MG 24 hr tablet TAKE ONE TABLET BY MOUTH EVERY MORNING   OXYGEN Inhale 2.5 L into the lungs continuous.    pantoprazole (PROTONIX) 40 MG tablet Take 1 tablet (40 mg total) by mouth 2 (two) times daily.   potassium chloride SA (KLOR-CON M) 20 MEQ tablet TAKE ONE TABLET BY MOUTH EVERY MORNING   Respiratory Therapy Supplies (FLUTTER) DEVI 1 Device by Does not apply route 3 (three) times daily.   roflumilast (DALIRESP) 500 MCG TABS tablet TAKE 1 TABLET(500 MCG) BY MOUTH DAILY   spironolactone (ALDACTONE) 25 MG tablet Take 0.5 tablets (12.5 mg total) by mouth daily.   TRELEGY ELLIPTA 200-62.5-25 MCG/ACT AEPB INHALE 1 PUFF BY MOUTH EVERY DAY   zolpidem (AMBIEN) 10 MG tablet Take 10 mg by mouth at bedtime.     Review of Systems      All other systems reviewed and are otherwise negative except as noted above.  Physical Exam    VS:  BP 122/78   Pulse 65   Ht 5' (1.524 m)   Wt 190 lb 12.8 oz (86.5 kg)   LMP 06/04/2000 (LMP Unknown) Comment: tubal ligation  SpO2 95% Comment: 3 liters  BMI 37.26 kg/m  , BMI Body mass index is 37.26 kg/m.  Wt Readings from Last  3 Encounters:  12/19/22 190 lb 12.8 oz (86.5 kg)  10/30/22 191 lb (86.6 kg)  06/20/22 195 lb 3.2 oz (88.5 kg)     GEN: Well nourished, well developed, in no acute distress. HEENT: normal. Neck: Supple, no JVD, carotid bruits, or masses. Cardiac: RRR, no murmurs, rubs, or gallops. No clubbing, cyanosis, edema.  Radials/PT 2+ and equal bilaterally.  Respiratory:  Respirations regular and unlabored, clear to auscultation bilaterally. GI: Soft, nontender, nondistended. MS: No deformity or atrophy. Skin: Warm and dry, no rash. Neuro:  Strength and sensation are intact. Psych: Normal affect.  Assessment & Plan    COPD acute exacerbation -continue current inhaled medications -She follows with [name]  Chronic diastolic CHF -She is on Spirolactone 12.'5mg'$  daily  -follow-up BMP -continue lasix '40mg'$  in the morning and '20mg'$  in the evening -she can take an extra '20mg'$  of lasix for weight gain or LE edema -echo updated 9/5, reassuring study, normal EF -daily weights -elastic therapy handout -appears euvolemic on exam today  Hypertension -well controlled today -We don't have room to add Entresto  Obesity -continue exercise and diet control -offered healthy weight and wellness consult but she is now living in [facility] and declined at this time         Disposition: Follow up 6 months with Fransico Him, MD or APP.  Signed, Elgie Collard, PA-C 12/19/2022, 11:15 AM [place]

## 2022-12-19 ENCOUNTER — Encounter: Payer: Self-pay | Admitting: Physician Assistant

## 2022-12-19 ENCOUNTER — Ambulatory Visit: Payer: Medicaid Other | Attending: Physician Assistant | Admitting: Physician Assistant

## 2022-12-19 DIAGNOSIS — J449 Chronic obstructive pulmonary disease, unspecified: Secondary | ICD-10-CM | POA: Diagnosis present

## 2022-12-19 DIAGNOSIS — I10 Essential (primary) hypertension: Secondary | ICD-10-CM

## 2022-12-19 DIAGNOSIS — I5032 Chronic diastolic (congestive) heart failure: Secondary | ICD-10-CM

## 2022-12-19 LAB — BASIC METABOLIC PANEL
BUN/Creatinine Ratio: 11 — ABNORMAL LOW (ref 12–28)
BUN: 10 mg/dL (ref 8–27)
CO2: 32 mmol/L — ABNORMAL HIGH (ref 20–29)
Calcium: 10 mg/dL (ref 8.7–10.3)
Chloride: 101 mmol/L (ref 96–106)
Creatinine, Ser: 0.88 mg/dL (ref 0.57–1.00)
Glucose: 120 mg/dL — ABNORMAL HIGH (ref 70–99)
Potassium: 3.8 mmol/L (ref 3.5–5.2)
Sodium: 145 mmol/L — ABNORMAL HIGH (ref 134–144)
eGFR: 73 mL/min/{1.73_m2} (ref >59)

## 2022-12-19 NOTE — Patient Instructions (Signed)
Medication Instructions:  1.You may take an extra 20 mg of lasix for a weight gain of 2 or more pounds overnight or 5 or more pounds in one week *If you need a refill on your cardiac medications before your next appointment, please call your pharmacy*   Lab Work: BMP today If you have labs (blood work) drawn today and your tests are completely normal, you will receive your results only by: [facility] (if you have MyChart) OR A paper copy in the mail If you have any lab test that is abnormal or we need to change your treatment, we will call you to review the results.   Follow-Up: At [facility], you and your health needs are our priority.  As part of our continuing mission to provide you with exceptional heart care, we have created designated Provider Care Teams.  These Care Teams include your primary Cardiologist (physician) and Advanced Practice Providers (APPs -  Physician Assistants and Nurse Practitioners) who all work together to provide you with the care you need, when you need it.   Your next appointment:   6 month(s)  Provider:   Fransico Him, MD   Other Instructions Weigh every morning after using the restroom but before breakfast and keep a log.

## 2022-12-24 ENCOUNTER — Inpatient Hospital Stay: Admission: RE | Admit: 2022-12-24 | Payer: Medicaid Other | Source: Ambulatory Visit

## 2022-12-29 ENCOUNTER — Other Ambulatory Visit: Payer: Self-pay | Admitting: Emergency Medicine

## 2023-01-09 ENCOUNTER — Telehealth: Payer: Self-pay | Admitting: Primary Care

## 2023-01-09 MED ORDER — ALBUTEROL SULFATE (2.5 MG/3ML) 0.083% IN NEBU: INHALATION_SOLUTION | RESPIRATORY_TRACT | 11 refills | Status: DC

## 2023-01-09 NOTE — Telephone Encounter (Signed)
Patient is call to get a refill on her albuterol solution for her nebulizer.  She stated that the pharmacy had sent a request last week and they have not gotten a response.  Please advise and send the script over to the pharmacy.  CB# [phone]

## 2023-01-09 NOTE — Telephone Encounter (Signed)
Patient called requesting refills on Albuterol solution. Sent refills to pharmacy. Patient is aware. Nothing further needed.

## 2023-01-17 ENCOUNTER — Telehealth: Payer: Self-pay | Admitting: Emergency Medicine

## 2023-01-17 ENCOUNTER — Ambulatory Visit: Payer: Medicaid Other | Admitting: Internal Medicine

## 2023-01-17 MED ORDER — PREDNISONE 10 MG PO TABS: ORAL_TABLET | ORAL | 0 refills | Status: DC

## 2023-01-17 MED ORDER — DOXYCYCLINE HYCLATE 100 MG PO TABS: 100.0000 mg | ORAL_TABLET | Freq: Two times a day (BID) | ORAL | 0 refills | Status: DC

## 2023-01-17 NOTE — Telephone Encounter (Signed)
Doxycycline 100 mg twice daily for 7 days Prednisone taper > Take '40mg'$  daily for 3 days, then '30mg'$  daily for 3 days, then '20mg'$  daily for 3 days, then '10mg'$  daily for 3 days, then stop Needs to be seen next week if she is not improving

## 2023-01-17 NOTE — Telephone Encounter (Signed)
Patient not able to make appointment today. Would like something called into pharmacy. Pharmacy is [facility]. Patient phone number is [phone].

## 2023-01-17 NOTE — Telephone Encounter (Signed)
Patient states having symptom of lung pain and cough. Pharmacy is [facility]. Patient phone number is [phone].

## 2023-01-17 NOTE — Telephone Encounter (Signed)
Cough-prod with dark green sputum and increased DOE x 2 wks Gets winded walking room to room  No fevers  No covid- tested neg x 2  She states she feels a "nagging" pain between her shoulder blades past 2 days- worse after exertion No CP, increased swelling Sats have been 88% on 3lpm at rest  She had acute visit this am, but was not able to make it and has cancelled this  Dr Lamonte Sakai, please advise, thanks!  I updated her meds and they are listed below   Current Outpatient Medications on File Prior to Visit  Medication Sig Dispense Refill   albuterol (PROVENTIL) (2.5 MG/3ML) 0.083% nebulizer solution USE 1 VIAL VIA NEBULIZER 4 TIMES DAILY AS NEEDED. 300 mL 11   albuterol (VENTOLIN HFA) 108 (90 Base) MCG/ACT inhaler Inhale 2 puffs into the lungs every 4 (four) hours as needed for wheezing or shortness of breath. 18 g 3   allopurinol (ZYLOPRIM) 100 MG tablet Take 100 mg by mouth daily.     ALPRAZolam (XANAX) 1 MG tablet Take 1 mg by mouth at bedtime as needed for sleep.   0   aspirin EC 81 MG tablet Take 81 mg by mouth daily.     benzonatate (TESSALON) 100 MG capsule Take 2 capsules (200 mg total) by mouth 3 (three) times daily as needed for cough. 60 capsule 1   cetirizine (ZYRTEC) 10 MG tablet Take 10 mg by mouth daily.     Cholecalciferol (VITAMIN D PO) Take 1 tablet by mouth daily.     clobetasol cream (TEMOVATE) [id number] % Apply 1 application topically daily as needed (irritation from pull ups).   0   famotidine (PEPCID) 20 MG tablet Take 1 tablet (20 mg total) by mouth at bedtime. (Patient not taking: Reported on 12/19/2022) 30 tablet 0   fluticasone (FLONASE) 50 MCG/ACT nasal spray Place 1 spray into both nostrils daily as needed (congestion).     furosemide (LASIX) 40 MG tablet Take 40 mg (1 tablet) by mouth in the morning and 20 mg (1/2 tablet) by mouth in the evening 135 tablet 3   gabapentin (NEURONTIN) 300 MG capsule Take 300 mg by mouth 3 (three) times daily.     guaiFENesin (MUCINEX) 600  MG 12 hr tablet Take 1 tablet (600 mg total) by mouth 2 (two) times daily as needed for to loosen phlegm. 30 tablet 1   HYDROcodone-acetaminophen (NORCO) 7.5-325 MG tablet Take 1 tablet by mouth every 6 (six) hours as needed for moderate pain.     linaclotide (LINZESS) 145 MCG CAPS capsule Take 145 mcg by mouth daily.     meclizine (ANTIVERT) 25 MG tablet Take 25 mg by mouth every 6 (six) hours as needed. (Patient not taking: Reported on 12/19/2022)     metoprolol succinate (TOPROL-XL) 50 MG 24 hr tablet TAKE ONE TABLET BY MOUTH EVERY MORNING 90 tablet 3   OXYGEN Inhale 2.5 L into the lungs continuous.      pantoprazole (PROTONIX) 40 MG tablet Take 1 tablet (40 mg total) by mouth 2 (two) times daily. 180 tablet 3   potassium chloride SA (KLOR-CON M) 20 MEQ tablet TAKE ONE TABLET BY MOUTH EVERY MORNING 90 tablet 3   Respiratory Therapy Supplies (FLUTTER) DEVI 1 Device by Does not apply route 3 (three) times daily. 1 each 0   roflumilast (DALIRESP) 500 MCG TABS tablet TAKE ONE TABLET BY MOUTH EVERY MORNING 30 tablet 6   spironolactone (ALDACTONE) 25 MG tablet Take 0.5 tablets (  12.5 mg total) by mouth daily. 15 tablet 12   TRELEGY ELLIPTA 200-62.5-25 MCG/ACT AEPB INHALE 1 PUFF BY MOUTH EVERY DAY 60 each 5   zolpidem (AMBIEN) 10 MG tablet Take 10 mg by mouth at bedtime.     No current facility-administered medications on file prior to visit.   Allergies  Allergen Reactions   Acetaminophen Other (See Comments)    Pt was told that she could not take Tylenol, does not know why.

## 2023-01-17 NOTE — Telephone Encounter (Signed)
Spoke with the pt  She is c/o increased SOB over the past wk  She has been coughing more and today and last night has been noticing "lung pain" Acute visit with MW at 11:30 am today Advised seek emergent care sooner if needed

## 2023-01-23 ENCOUNTER — Ambulatory Visit
Admission: RE | Admit: 2023-01-23 | Discharge: 2023-01-23 | Disposition: A | Discharge status: Home / Self Care | Payer: Medicaid Other | Source: Ambulatory Visit | Attending: Acute Care | Admitting: Acute Care

## 2023-01-23 DIAGNOSIS — Z87891 Personal history of nicotine dependence: Secondary | ICD-10-CM

## 2023-01-27 ENCOUNTER — Other Ambulatory Visit: Payer: Self-pay | Admitting: Acute Care

## 2023-01-27 DIAGNOSIS — Z87891 Personal history of nicotine dependence: Secondary | ICD-10-CM

## 2023-01-27 DIAGNOSIS — Z122 Encounter for screening for malignant neoplasm of respiratory organs: Secondary | ICD-10-CM

## 2023-02-11 ENCOUNTER — Encounter (HOSPITAL_COMMUNITY): Payer: Self-pay | Admitting: Gastroenterology

## 2023-02-14 NOTE — Anesthesia Preprocedure Evaluation (Addendum)
Anesthesia Evaluation  Patient identified by MRN, date of birth, ID band Patient awake    Reviewed: Allergy & Precautions, H&P , NPO status , Patient's Chart, lab work & pertinent test results  Airway Mallampati: III  TM Distance: >3 FB Neck ROM: Full    Dental no notable dental hx. (+) Upper Dentures, Lower Dentures, Dental Advisory Given   Pulmonary sleep apnea , COPD,  COPD inhaler and oxygen dependent, former smoker   Pulmonary exam normal breath sounds clear to auscultation       Cardiovascular Exercise Tolerance: Good hypertension, Pt. on medications +CHF   Rhythm:Regular Rate:Normal     Neuro/Psych   Anxiety     negative neurological ROS     GI/Hepatic Neg liver ROS,GERD  Medicated,,  Endo/Other  diabetes  Morbid obesity  Renal/GU negative Renal ROS  negative genitourinary   Musculoskeletal  (+) Arthritis , Osteoarthritis,    Abdominal   Peds  Hematology  (+) Blood dyscrasia, anemia   Anesthesia Other Findings   Reproductive/Obstetrics negative OB ROS                             Anesthesia Physical Anesthesia Plan  ASA: 3  Anesthesia Plan: MAC   Post-op Pain Management: Minimal or no pain anticipated   Induction: Intravenous  PONV Risk Score and Plan: 2 and Propofol infusion  Airway Management Planned: Natural Airway and Simple Face Mask  Additional Equipment:   Intra-op Plan:   Post-operative Plan:   Informed Consent: I have reviewed the patients History and Physical, chart, labs and discussed the procedure including the risks, benefits and alternatives for the proposed anesthesia with the patient or authorized representative who has indicated his/her understanding and acceptance.     Dental advisory given  Plan Discussed with: CRNA  Anesthesia Plan Comments:        Anesthesia Quick Evaluation

## 2023-02-15 ENCOUNTER — Ambulatory Visit (HOSPITAL_COMMUNITY): Payer: Medicaid Other | Admitting: Anesthesiology

## 2023-02-15 ENCOUNTER — Encounter (HOSPITAL_COMMUNITY): Payer: Self-pay | Admitting: Gastroenterology

## 2023-02-15 ENCOUNTER — Encounter (HOSPITAL_COMMUNITY)
Admission: RE | Disposition: A | Discharge status: Home / Self Care | Payer: Self-pay | Source: Home / Self Care | Attending: Gastroenterology

## 2023-02-15 ENCOUNTER — Other Ambulatory Visit: Payer: Self-pay

## 2023-02-15 ENCOUNTER — Ambulatory Visit (HOSPITAL_COMMUNITY)
Admission: RE | Admit: 2023-02-15 | Discharge: 2023-02-15 | Disposition: A | Discharge status: Home / Self Care | Payer: Medicaid Other | Attending: Gastroenterology | Admitting: Gastroenterology

## 2023-02-15 DIAGNOSIS — J439 Emphysema, unspecified: Secondary | ICD-10-CM | POA: Diagnosis not present

## 2023-02-15 DIAGNOSIS — Z86010 Personal history of colonic polyps: Secondary | ICD-10-CM | POA: Diagnosis not present

## 2023-02-15 DIAGNOSIS — I110 Hypertensive heart disease with heart failure: Secondary | ICD-10-CM | POA: Insufficient documentation

## 2023-02-15 DIAGNOSIS — Z1211 Encounter for screening for malignant neoplasm of colon: Secondary | ICD-10-CM | POA: Diagnosis present

## 2023-02-15 DIAGNOSIS — F419 Anxiety disorder, unspecified: Secondary | ICD-10-CM | POA: Insufficient documentation

## 2023-02-15 DIAGNOSIS — D573 Sickle-cell trait: Secondary | ICD-10-CM | POA: Insufficient documentation

## 2023-02-15 DIAGNOSIS — Z6836 Body mass index (BMI) 36.0-36.9, adult: Secondary | ICD-10-CM | POA: Insufficient documentation

## 2023-02-15 DIAGNOSIS — Z8541 Personal history of malignant neoplasm of cervix uteri: Secondary | ICD-10-CM | POA: Diagnosis not present

## 2023-02-15 DIAGNOSIS — Z87891 Personal history of nicotine dependence: Secondary | ICD-10-CM | POA: Diagnosis not present

## 2023-02-15 DIAGNOSIS — D630 Anemia in neoplastic disease: Secondary | ICD-10-CM

## 2023-02-15 DIAGNOSIS — K219 Gastro-esophageal reflux disease without esophagitis: Secondary | ICD-10-CM | POA: Insufficient documentation

## 2023-02-15 DIAGNOSIS — D123 Benign neoplasm of transverse colon: Secondary | ICD-10-CM | POA: Diagnosis not present

## 2023-02-15 DIAGNOSIS — E785 Hyperlipidemia, unspecified: Secondary | ICD-10-CM | POA: Insufficient documentation

## 2023-02-15 DIAGNOSIS — Z9981 Dependence on supplemental oxygen: Secondary | ICD-10-CM | POA: Diagnosis not present

## 2023-02-15 DIAGNOSIS — E119 Type 2 diabetes mellitus without complications: Secondary | ICD-10-CM

## 2023-02-15 DIAGNOSIS — M1990 Unspecified osteoarthritis, unspecified site: Secondary | ICD-10-CM | POA: Diagnosis not present

## 2023-02-15 DIAGNOSIS — K635 Polyp of colon: Secondary | ICD-10-CM | POA: Insufficient documentation

## 2023-02-15 DIAGNOSIS — I5032 Chronic diastolic (congestive) heart failure: Secondary | ICD-10-CM | POA: Diagnosis not present

## 2023-02-15 DIAGNOSIS — D125 Benign neoplasm of sigmoid colon: Secondary | ICD-10-CM

## 2023-02-15 DIAGNOSIS — G4733 Obstructive sleep apnea (adult) (pediatric): Secondary | ICD-10-CM | POA: Insufficient documentation

## 2023-02-15 DIAGNOSIS — I509 Heart failure, unspecified: Secondary | ICD-10-CM

## 2023-02-15 HISTORY — PX: POLYPECTOMY: SHX5525

## 2023-02-15 HISTORY — PX: COLONOSCOPY WITH PROPOFOL: SHX5780

## 2023-02-15 LAB — GLUCOSE, CAPILLARY: Glucose-Capillary: 144 mg/dL — ABNORMAL HIGH (ref 70–99)

## 2023-02-15 SURGERY — COLONOSCOPY WITH PROPOFOL
Anesthesia: Monitor Anesthesia Care

## 2023-02-15 MED ORDER — LACTATED RINGERS IV SOLN: INTRAVENOUS | Status: DC

## 2023-02-15 MED ORDER — LIDOCAINE HCL 1 % IJ SOLN
INTRAMUSCULAR | Status: DC | PRN
  Administered 2023-02-15: 50 mg via INTRADERMAL

## 2023-02-15 MED ORDER — PROPOFOL 500 MG/50ML IV EMUL
INTRAVENOUS | Status: DC | PRN
  Administered 2023-02-15: 120 ug/kg/min via INTRAVENOUS

## 2023-02-15 MED ORDER — SODIUM CHLORIDE 0.9 % IV SOLN: INTRAVENOUS | Status: DC

## 2023-02-15 MED ORDER — PROPOFOL 1000 MG/100ML IV EMUL
INTRAVENOUS | Status: AC
  Filled 2023-02-15: qty 100

## 2023-02-15 MED ORDER — PROPOFOL 10 MG/ML IV BOLUS
INTRAVENOUS | Status: DC | PRN
  Administered 2023-02-15 (×3): 10 mg via INTRAVENOUS

## 2023-02-15 SURGICAL SUPPLY — 22 items

## 2023-02-15 NOTE — Discharge Instructions (Signed)

## 2023-02-15 NOTE — Anesthesia Postprocedure Evaluation (Signed)
Anesthesia Post Note  Patient: Stephanie Schultz  Procedure(s) Performed: COLONOSCOPY WITH PROPOFOL POLYPECTOMY     Patient location during evaluation: Endoscopy Anesthesia Type: MAC Level of consciousness: awake and alert Pain management: pain level controlled Vital Signs Assessment: post-procedure vital signs reviewed and stable Respiratory status: spontaneous breathing, nonlabored ventilation, respiratory function stable and patient connected to nasal cannula oxygen Cardiovascular status: stable and blood pressure returned to baseline Postop Assessment: no apparent nausea or vomiting Anesthetic complications: no  No notable events documented.  Last Vitals:  Vitals:   02/15/23 1045 02/15/23 1055  BP: (!) 158/71 (!) 149/70  Pulse: 79 77  Resp: (!) 22 20  Temp:    SpO2: 100% 100%    Last Pain:  Vitals:   02/15/23 1055  TempSrc:   PainSc: 0-No pain                 Arti Trang,W. EDMOND

## 2023-02-15 NOTE — Transfer of Care (Signed)
Immediate Anesthesia Transfer of Care Note  Patient: Stephanie Schultz  Procedure(s) Performed: COLONOSCOPY WITH PROPOFOL POLYPECTOMY  Patient Location: PACU and Endoscopy Unit  Anesthesia Type:MAC  Level of Consciousness: awake, alert , oriented, and patient cooperative  Airway & Oxygen Therapy: Patient Spontanous Breathing and Patient connected to face mask oxygen  Post-op Assessment: Report given to RN and Post -op Vital signs reviewed and stable  Post vital signs: Reviewed and stable  Last Vitals:  Vitals Value Taken Time  BP 153/70 02/15/23 1050  Temp 36.8 C 02/15/23 1035  Pulse 76 02/15/23 1056  Resp 35 02/15/23 1056  SpO2 100 % 02/15/23 1056  Vitals shown include unvalidated device data.  Last Pain:  Vitals:   02/15/23 1035  TempSrc: Temporal  PainSc: 0-No pain         Complications: No notable events documented.

## 2023-02-15 NOTE — H&P (Signed)
Teodoro KilPamela Shoffner Carcamo HPI: Her colonoscopy in 09/06/2015 was positive for two small adenomas.  In the past she was on Linzess for constipation, but now she reports having "the runs all the time".  Her SOB is better with starting a new diuretic.   Past Medical History:  Diagnosis Date   Anemia    chronis   Anxiety    Arthritis    Cervical cancer 1990   cervical    Chronic diastolic CHF (congestive heart failure) 08/10/2016   COPD (chronic obstructive pulmonary disease)    O2 dependent. Pulmo: Dr. Delton CoombesByrum   Diabetes mellitus without complication    a. A1c 8.3 in 11/2015   Emphysema    Gallstones    s/p cholecystectomy   GERD (gastroesophageal reflux disease)    Gout    History of home oxygen therapy    "2.5L; 24/7" (08/10/2016)   Hx of cardiac catheterization    a. [facility] [place] 09/2008:  Normal coronary arteries EF 70%. b. LHC 11/2016: normal cors, normal LVEDP, EF 55-65%.   Hyperlipidemia    Hypertension    Hypoxia 11/17/2018   Morbid obesity    OSA on CPAP    CPAP at night    Pneumonia    Sickle cell trait    Tobacco abuse    a. up to 3ppd from age 67 to [age], now 1/4 ppd (01/2013) >> Quit 10/2015    Past Surgical History:  Procedure Laterality Date   BREAST BIOPSY Right 02/23/2019   x2   BREAST BIOPSY Left 2014   CARDIAC CATHETERIZATION     CARDIAC CATHETERIZATION N/A 12/03/2016   Procedure: Left Heart Cath and Coronary Angiography;  Surgeon: Peter M SwazilandJordan, MD;  [facility];  Service: Cardiovascular;  Laterality: N/A;   CHOLECYSTECTOMY N/A 12/13/2015   Procedure: LAPAROSCOPIC CHOLECYSTECTOMY;  Surgeon: Axel FillerArmando Ramirez, MD;  Location: [facility];  Service: General;  Laterality: N/A;   COLONOSCOPY  09/05/2012   Procedure: COLONOSCOPY;  Surgeon: Theda BelfastPatrick D Montravious Weigelt, MD;  Location: [facility];  Service: Endoscopy;  Laterality: N/A;   COLONOSCOPY WITH PROPOFOL N/A 09/02/2015   Procedure: COLONOSCOPY WITH PROPOFOL;  Surgeon: Jeani HawkingPatrick Nahome Bublitz, MD;  Location: [facility]  ENDOSCOPY;  Service: Endoscopy;  Laterality: N/A;   ESOPHAGOGASTRODUODENOSCOPY (EGD) WITH PROPOFOL N/A 03/29/2017   Procedure: ESOPHAGOGASTRODUODENOSCOPY (EGD) WITH PROPOFOL;  Surgeon: Jeani HawkingHung, Liliane Mallis, MD;  Location: [facility];  Service: Endoscopy;  Laterality: N/A;   TUBAL LIGATION      Family History  Problem Relation Age of Onset   Other Father        unaware of father's medical history   Diabetes Mother        alive @ 4770   Myasthenia gravis Mother    Lung cancer Paternal Aunt    Lung cancer Paternal Grandfather    Other Other        multiple siblings a&w.   Heart attack Neg Hx    Heart failure Neg Hx    Breast cancer Neg Hx     Social History:  reports that she quit smoking about 7 years ago. Her smoking use included cigarettes. She has a 108.00 pack-year smoking history. She has never used smokeless tobacco. She reports that she does not drink alcohol and does not use drugs.  Allergies:  Allergies  Allergen Reactions   Acetaminophen Other (See Comments)    Pt was told that she could not take Tylenol, does not know why.    Medications: Scheduled: Continuous:  sodium chloride     lactated ringers     lactated ringers 10 mL/hr at 02/15/23 5366    Results for orders placed or performed during the hospital encounter of 02/15/23 (from the past 24 hour(s))  Glucose, capillary     Status: Abnormal   Collection Time: 02/15/23  9:34 AM  Result Value Ref Range   Glucose-Capillary 144 (H) 70 - 99 mg/dL     No results found.  ROS:  As stated above in the HPI otherwise negative.  Blood pressure (!) 159/55, pulse 85, temperature 97.8 F (36.6 C), temperature source Temporal, resp. rate (!) 24, height 5' (1.524 m), weight 84.8 kg, last menstrual period 06/04/2000, SpO2 96 %.    PE: Gen: NAD, Alert and Oriented HEENT:  [place]/AT, EOMI Neck: Supple, no LAD Lungs: CTA Bilaterally CV: RRR without M/G/R ABD: Soft, NTND, +BS Ext: No C/C/E  Assessment/Plan: 1) Personal history  of polyps - colonoscopy.  Nuala Chiles D 02/15/2023, 9:41 AM

## 2023-02-15 NOTE — Op Note (Signed)
[facility] Patient Name: Stephanie Schultz Procedure Date: 02/15/2023 MRN: [id number] Attending MD: Jeani Hawking , MD, [id number] Date of Birth: 21-May-1956 CSN: [id number] Age: 67 Admit Type: Outpatient Procedure:                Colonoscopy Indications:              High risk colon cancer surveillance: Personal                            history of colonic polyps Providers:                Jeani Hawking, MD, Marge Duncans, RN, Beryle Beams, Technician, Marlena Clipper, CRNA Referring MD:              Medicines:                Propofol per Anesthesia Complications:            No immediate complications. Estimated Blood Loss:     Estimated blood loss: none. Procedure:                Pre-Anesthesia Assessment:                           - Prior to the procedure, a History and Physical                            was performed, and patient medications and                            allergies were reviewed. The patient's tolerance of                            previous anesthesia was also reviewed. The risks                            and benefits of the procedure and the sedation                            options and risks were discussed with the patient.                            All questions were answered, and informed consent                            was obtained. Prior Anticoagulants: The patient has                            taken no anticoagulant or antiplatelet agents. ASA                            Grade Assessment: III - A patient with severe  systemic disease. After reviewing the risks and                            benefits, the patient was deemed in satisfactory                            condition to undergo the procedure.                           - Sedation was administered by an anesthesia                            professional. Deep sedation was attained.                           After obtaining  informed consent, the colonoscope                            was passed under direct vision. Throughout the                            procedure, the patient's blood pressure, pulse, and                            oxygen saturations were monitored continuously. The                            CF-HQ190L ([id number]) Olympus colonoscope was                            introduced through the anus and advanced to the the                            cecum, identified by appendiceal orifice and                            ileocecal valve. The colonoscopy was somewhat                            difficult due to a tortuous colon. Successful                            completion of the procedure was aided by using                            manual pressure and straightening and shortening                            the scope to obtain bowel loop reduction. The                            patient tolerated the procedure well. The quality  of the bowel preparation was evaluated using the                            BBPS [facility]([place] Bowel Preparation Scale) with scores                            of: Right Colon = 3, Transverse Colon = 3 and Left                            Colon = 3 (entire mucosa seen well with no residual                            staining, small fragments of stool or opaque                            liquid). The total BBPS score equals 9. The                            ileocecal valve, appendiceal orifice, and rectum                            were photographed. Scope In: 10:05:46 AM Scope Out: 10:28:03 AM Scope Withdrawal Time: 0 hours 13 minutes 8 seconds  Total Procedure Duration: 0 hours 22 minutes 17 seconds  Findings:      Eight sessile polyps were found in the sigmoid colon and transverse       colon. The polyps were 2 to 3 mm in size. These polyps were removed with       a cold snare. Resection and retrieval were complete. Impression:               - Eight 2  to 3 mm polyps in the sigmoid colon and                            in the transverse colon, removed with a cold snare.                            Resected and retrieved. Moderate Sedation:      Not Applicable - Patient had care per Anesthesia. Recommendation:           - Patient has a contact number available for                            emergencies. The signs and symptoms of potential                            delayed complications were discussed with the                            patient. Return to normal activities tomorrow.                            Written discharge instructions were provided to the  patient.                           - Resume previous diet.                           - Continue present medications.                           - Await pathology results.                           - Repeat colonoscopy in 3 years for surveillance. Procedure Code(s):        --- Professional ---                           [phone], Colonoscopy, flexible; with removal of                            tumor(s), polyp(s), or other lesion(s) by snare                            technique Diagnosis Code(s):        --- Professional ---                           D12.5, Benign neoplasm of sigmoid colon                           Z86.010, Personal history of colonic polyps                           D12.3, Benign neoplasm of transverse colon (hepatic                            flexure or splenic flexure) CPT copyright 2022 American Medical Association. All rights reserved. The codes documented in this report are preliminary and upon coder review may  be revised to meet current compliance requirements. Jeani HawkingPatrick Blu Mcglaun, MD Jeani HawkingPatrick Rocklyn Mayberry, MD 02/15/2023 10:35:21 AM This report has been signed electronically. Number of Addenda: 0

## 2023-02-18 LAB — SURGICAL PATHOLOGY

## 2023-02-19 ENCOUNTER — Encounter (HOSPITAL_COMMUNITY): Payer: Self-pay | Admitting: Gastroenterology

## 2023-03-27 ENCOUNTER — Telehealth: Payer: Self-pay | Admitting: Primary Care

## 2023-03-27 ENCOUNTER — Other Ambulatory Visit: Payer: Self-pay

## 2023-03-27 MED ORDER — FLUTICASONE PROPIONATE 50 MCG/ACT NA SUSP: 1.0000 | Freq: Every day | NASAL | 6 refills | Status: DC | PRN

## 2023-03-27 NOTE — Telephone Encounter (Signed)
Dr. Delton Coombes okay to refill flonase? Never be filled by a provider here

## 2023-03-27 NOTE — Telephone Encounter (Signed)
Pt needs a refill on Flonase   [facility]

## 2023-03-27 NOTE — Telephone Encounter (Signed)
Spoke with patient. Advised flonase has been sent to pharmacy. NFN

## 2023-03-27 NOTE — Telephone Encounter (Signed)
Yes please

## 2023-03-28 MED ORDER — FLUTICASONE PROPIONATE 50 MCG/ACT NA SUSP: 1.0000 | Freq: Every day | NASAL | 6 refills | Status: DC | PRN

## 2023-03-28 NOTE — Telephone Encounter (Signed)
Pt called the office about the Flonase stating that she was still waiting to hear if the Rx was going to be sent to the pharmacy for her or not. Stated to her that we were waiting to hear from RB if he was okay with [place] sending the Rx. Stated to pt that RB was fine with [place] sending Rx to the pharmacy and let her know that I did refill the medication for her and she verbalized understanding. Nothing further needed.

## 2023-05-01 ENCOUNTER — Ambulatory Visit: Payer: Medicaid Other | Admitting: Primary Care

## 2023-05-07 ENCOUNTER — Ambulatory Visit (INDEPENDENT_AMBULATORY_CARE_PROVIDER_SITE_OTHER): Payer: Medicaid Other | Admitting: Primary Care

## 2023-05-07 ENCOUNTER — Encounter: Payer: Self-pay | Admitting: Primary Care

## 2023-05-07 VITALS — BP 116/60 | HR 90 | Temp 98.4°F | Ht 60.0 in | Wt 188.2 lb

## 2023-05-07 DIAGNOSIS — J9611 Chronic respiratory failure with hypoxia: Secondary | ICD-10-CM

## 2023-05-07 DIAGNOSIS — G479 Sleep disorder, unspecified: Secondary | ICD-10-CM | POA: Diagnosis not present

## 2023-05-07 DIAGNOSIS — Z8669 Personal history of other diseases of the nervous system and sense organs: Secondary | ICD-10-CM | POA: Diagnosis not present

## 2023-05-07 DIAGNOSIS — I5032 Chronic diastolic (congestive) heart failure: Secondary | ICD-10-CM

## 2023-05-07 MED ORDER — AZELASTINE HCL 0.1 % NA SOLN: 2.0000 | Freq: Two times a day (BID) | NASAL | 12 refills | Status: DC

## 2023-05-07 MED ORDER — PREDNISONE 10 MG PO TABS: ORAL_TABLET | ORAL | 0 refills | Status: DC

## 2023-05-07 MED ORDER — AMOXICILLIN-POT CLAVULANATE 875-125 MG PO TABS
1.0000 | ORAL_TABLET | Freq: Two times a day (BID) | ORAL | 0 refills | Status: DC
Start: 2023-05-07 — End: 2023-06-28

## 2023-05-07 NOTE — Patient Instructions (Signed)
Recommendations Continue Trelegy 1 puff daily Continue Daliresp 250 mcg daily Start Astelin nasal spray twice daily for nasal congestion  Take antibiotic and prednisone as prescribed for starts of bronchitis and COPD flareup We will get split-night sleep study to reassess for OSA and possible CPAP needed Continue to wear 2 to 3 L of oxygen 24/7 to maintain O2 greater than 90%  Follow-up 3 months with Beth NP or sooner if needed

## 2023-05-07 NOTE — Progress Notes (Signed)
@Patient  ID: Stephanie Schultz, female    DOB: 1956-05-22, 67 y.o.   MRN: [id number]  Chief Complaint  Patient presents with   Follow-up    Overall, doing well.  Some SOB at times with exertion.    Referring provider: Rometta Emery, MD  HPI: 67 year old female, former smoker quit in 2016 (108-pack-year history).  Significant for COPD component, chronic cough, chronic respiratory failure O2 dependent, pulmonary nodule, allergic rhinitis, chronic diastolic heart failure, hypertension, GERD, type 2 diabetes, obesity.  Patient of Dr. Delton Coombes. Maintained on Trelegy, Daliresp, prn albuterol and continuous oxygen at 3-4L/min.  Previous [facility] pulmonary encounters: 08/24/20- ROV, Dr. Delton Coombes  Follow-up visit for 67 year old woman with very severe COPD with asthmatic features.  Also with superimposed upper airway irritation syndrome and chronic cough.  Followed for hypertension and diastolic dysfunction, obesity.  She has chronic hypoxemia on 2 to 3 L/min with documented nocturnal hypoxemia, no OSA. She reports today that she is doing pretty well. She is on daliresp for the last 2 months, on Trelegy. Has albuterol that she uses . She is on flonase, zyrtec. On esomeprazole. Her last flare was over 6 months ago. COVID vaccine up to date, Flu up to date. Needs PNA shot after age 26 PFT done today reviewed by me show very severe obstruction. Restricted volumes, decreased diffusion capacity.   She needs 6 more green e-tanks for her O2 from [employer].   03/14/2021  Patient presents today for 6 month follow-up COPD.  Her breathing has more some worse over the last two week with np cough. Feels it is related to seasonal allergies. She is using flonase nasal spray daily. Asking to try tesslon perles. She has not had a recent exacerbation, prior to being on Daliresp she was experiencing frequent flare ups. Maintained on Trelegy 100, prn albuterol and Daliresp. She is on oxygen using 2-4L. She has lost 30 lbs, she  has been eating salads and is exercising with bicycle / weights. She states that she can do more with her daughter/grandchildren and can walker longer distances. She is off all her diabetes medications.   09/20/2021 Patient presents today for 6 month follow-up. She is doing very well. She has had significantly less amount of exacerbations his past year since starting Daliresp. She was hospitalized overnight at [facility] in September for COPD. She is doing well today. She has no significant shortness of breath. She keeps a chronic cough, tessalon perles help. She is compliant with Trelegy and daliresp. She uses albuterol nebulizer morning and evening. She is on 2.5-3L oxygen at rest and 4L with exertion. No increased O2 demand.  Dyspnea: None  Cough: Chronic cough  SABA: Use twice a day scheduled  Oxygen: 2.5-4L Nocturnal: 1-2 pillows  12/19/2021 Patient contacted today for acute virtual/video visit. Following with our office for hx COPD and chronic respiratory failure. Patient reports symptoms of cough, shortness of breath and chest tightness over x 6 weeks. Breathing is worse at night. Cough is productive with green-yellow mucus. She is maintained on Trelegy , Daliresp, prn albuterol and continuous oxygen at 3-4L/min. She was last hospitalized in September for COPD exacerbation.   12/28/2021  Patient presents today for 6 week follow-up. She was treated for acute exacerbation last week with Doxycycline and prednisone taper without significant improvement. She still has cough with yellow mucus along with chest tightness, nasal congestion and ear fullness. She has not tried taking mucinex and is not currently using Flonase. She is taking Trelegy  and Daliresp as prescribed. She is using her albuterol 4-5 times a day. On 3L oxygen 24/7.   01/11/2022 Patient presents today for 2 week follow-up prolonged COPD exacerbation. During last visit we increased her Trelegy to . Ordered CXR  and sputum culture. Advised she start mucinex and flutter valve.   She is doing some better. She completed course of doxycyline and prednisone in early march. She still has a cough and some chest tightness but states that it is not as bad as last visit. She has been taking mucinex and flutter valve. Cough is productive with yellow-green mucus, she has not yet been able to provide sputum sample. She was not able to get higher dose Trelegy d/t insurance. We will give her a sample today for her to try. She is on 3L oxygen at rest. Patient reports being inactive and basically home bound before starting on Daliresp. She is able to do much more and is pursing her GED.   02/07/2022 Patient called our office on 3/10 with reports of morning shortness of breath, wheezing and productive cough with yellow mucus. She was sent in course of Doxycycline. She has not been able to provide [place] with a sputum culture. She is doing better. Her only complaint is chest tightness, thinks it is related to reflux. She is taking nexium 40mg  daily in the morning. Using 2L supplemental oxygen at rest and 3L on exertion. She has very severe airflow limitation on pulmonary function testing. No significant BD response.   03/15/2022  Patient presents today for 2 week follow-up. She is doing well. No recent exacerbations, last one was in Feb/March. During last visit we gave her a sample of Breztri to try. She noticed increased fatigue and chest tightness while taking Breztri. Some days she has lots of energy and other days she does not. She has since resumed Trelegy one puff daily. She still has an occasional cough, relates it to the pollen.   No significant mucus production. She takes zyrtec daily and uses flonase as needed. She uses albuterol nebulizer morning and evening. Chest tightness has resolved. She has been taking reflux medication as directed. She is worried she is not voiding as much as she should be. She takes lasix 60mg  daily  in the morning, no significant leg swelling. Some eye puffiness. CAT score 14.    05/07/2023- Interim hx  Patient presents today for an overdue follow-up.  She was last seen by Dr. Delton Coombes on 10/30/2022 shortness of breath.  Dyspnea felt to be multifactorial.  Maintained on Trelegy, as needed albuterol and Daliresp. She does benefit from prednisone and azithromycin  She feels she at the beginnings of getting an upper respiratory infection. She feels little more congestion. She has a productive cough with green mucus. She is getting up a lot of phlegm. Her breathing is stable. Using trelegy inhaler daily. She does not have to use it much, she did use it this morning. Sometimes she just includes it with her morning medication due to morning cough. She continues Daliresp. Flonase causes nose bleed.   She takes Ambien at bedtime for insomnia. Bedtime is 9pm. It takes her 60 mins to fall asleep on average. She starts her day at 7:30am. She has been waking up with headaches. Her sleep can be restless. No significant daytime sleepiness. She does not take naps.   She is taking lasix. When she does not take she feels bloated. She follows 2L fluid restriction and low sodium diet. Weighs herself  daily, weight is up 1.5lbs. She has some slight swelling to top of her feet, nothing significant.   Recent COPD exacerbations: January 19th, 2021-doxycycline, prednisone taper Mar 15, 2020- Z-Pak April 18, 2020- Z-Pak, prednisone taper May 17, 2020- Doxycycline, prednisone taper  03/14/2021 - Increased Trelegy x 2 weeks/ holding off on oral steriods and no indication for abx at this time  05/01/21- Called in with sob/cough and given zpack and prednisone  07/2021- Hospitalized overnight for COPD exacerbation 12/19/2021- Doxy/prednisone taper  01/11/2022- Pred taper, sample Trelegy (COPD exacerbation from Feb never completely resolved) 01/19/2022- No improvement from 3/10, sent in doxycycline  10/15/22-  azithromycin and prednisone  March 2024- prednisone and doxycycline     TEST/EVENTS :  Cardiac: > 08/23/2017-echocardiogram-LV ejection fraction 60 to 65% > 06/01/20- echocardiogram- LV ejection fraction 60-65%, mild concentric left ventricular hypertrophy, normal diastolic parameters    Pulmonary function testing: > 11/16/2013 PFTs- FVC 1.58 (67% predicted), postbronchodilator ratio 60, postbronchodilator FEV1 1.00 (53% predicted), no significant bronchodilator response, mid flow reversibility after administration of bronchodilator, DLCO 39 > 01/12/19 PFTs-FVC 1.18 (52%), FEV1 0.66 (37%), ratio 56, borderline bronchodilator response, DLCO correlated 6.07 (34%)   Imaging: > October 2020 CT chest - Neg for PE, +emphysema with no acute process. > 05/11/20 CTA- No PE, Chronic emphysematous changes in the upper lobes. Chronic linear scarring at the lung bases, left more than right. No effusions or acute infiltrates.   Allergies  Allergen Reactions   Acetaminophen Other (See Comments)    Pt was told that she could not take Tylenol, does not know why.    Immunization History  Administered Date(s) Administered   Influenza Split 09/20/2011, 09/12/2012, 08/12/2013   Influenza,inj,Quad PF,6+ Mos 08/03/2014, 08/12/2015, 08/12/2017, 07/29/2018, 09/11/2021   Influenza,inj,Quad PF,6-35 Mos 09/11/2019   PFIZER(Purple Top)SARS-COV-2 Vaccination 02/05/2020, 02/25/2020, 11/08/2020   Pneumococcal Conjugate-13 09/24/2017   Pneumococcal Polysaccharide-23 11/12/2009    Past Medical History:  Diagnosis Date   Anemia    chronis   Anxiety    Arthritis    Cervical cancer (HCC) 1990   cervical    Chronic diastolic CHF (congestive heart failure) (HCC) 08/10/2016   COPD (chronic obstructive pulmonary disease) (HCC)    O2 dependent. Pulmo: Dr. Delton Coombes   Diabetes mellitus without complication (HCC)    a. A1c 8.3 in 11/2015   Emphysema    Gallstones    s/p cholecystectomy   GERD (gastroesophageal reflux  disease)    Gout    History of home oxygen therapy    "2.5L; 24/7" (08/10/2016)   Hx of cardiac catheterization    a. LHC at [facility] in [place], [place] 09/2008:  Normal coronary arteries EF 70%. b. LHC 11/2016: normal cors, normal LVEDP, EF 55-65%.   Hyperlipidemia    Hypertension    Hypoxia 11/17/2018   Morbid obesity (HCC)    OSA on CPAP    CPAP at night    Pneumonia    Sickle cell trait (HCC)    Tobacco abuse    a. up to 3ppd from age 31 to 71, now 1/4 ppd (01/2013) >> Quit 10/2015    Tobacco History: Social History   Tobacco Use  Smoking Status Former   Packs/day: 3.00   Years: 36.00   Additional pack years: 0.00   Total pack years: 108.00   Types: Cigarettes   Quit date: 11/11/2015   Years since quitting: 7.5  Smokeless Tobacco Never  Tobacco Comments   Approx 90 pk-yrs (up to 3ppd until ~ 2009).  Counseling given: Not Answered Tobacco comments: Approx 90 pk-yrs (up to 3ppd until ~ 2009).   Outpatient Medications Prior to Visit  Medication Sig Dispense Refill   albuterol (PROVENTIL) (2.5 MG/3ML) 0.083% nebulizer solution USE 1 VIAL VIA NEBULIZER 4 TIMES DAILY AS NEEDED. 300 mL 11   albuterol (VENTOLIN HFA) 108 (90 Base) MCG/ACT inhaler Inhale 2 puffs into the lungs every 4 (four) hours as needed for wheezing or shortness of breath. 18 g 3   allopurinol (ZYLOPRIM) 100 MG tablet Take 100 mg by mouth daily.     ALPRAZolam (XANAX) 1 MG tablet Take 1 mg by mouth at bedtime as needed for sleep.   0   aspirin EC 81 MG tablet Take 81 mg by mouth daily.     benzonatate (TESSALON) 100 MG capsule Take 2 capsules (200 mg total) by mouth 3 (three) times daily as needed for cough. 60 capsule 1   cetirizine (ZYRTEC) 10 MG tablet Take 10 mg by mouth daily.     Cholecalciferol (VITAMIN D PO) Take 1 tablet by mouth daily.     clobetasol cream (TEMOVATE) 0.05 % Apply 1 application topically daily as needed (irritation from pull ups).   0   fluticasone (FLONASE) 50 MCG/ACT nasal spray Place  1 spray into both nostrils daily as needed (congestion). 16 g 6   furosemide (LASIX) 40 MG tablet Take 40 mg (1 tablet) by mouth in the morning and 20 mg (1/2 tablet) by mouth in the evening 135 tablet 3   gabapentin (NEURONTIN) 300 MG capsule Take 300 mg by mouth 3 (three) times daily.     HYDROcodone-acetaminophen (NORCO) 7.5-325 MG tablet Take 1 tablet by mouth every 6 (six) hours as needed for moderate pain.     linaclotide (LINZESS) 145 MCG CAPS capsule Take 145 mcg by mouth daily.     meclizine (ANTIVERT) 25 MG tablet Take 25 mg by mouth every 6 (six) hours as needed.     metoprolol succinate (TOPROL-XL) 50 MG 24 hr tablet TAKE ONE TABLET BY MOUTH EVERY MORNING 90 tablet 3   OXYGEN Inhale 3 L into the lungs continuous.     pantoprazole (PROTONIX) 40 MG tablet Take 1 tablet (40 mg total) by mouth 2 (two) times daily. 180 tablet 3   potassium chloride SA (KLOR-CON M) 20 MEQ tablet TAKE ONE TABLET BY MOUTH EVERY MORNING 90 tablet 3   Respiratory Therapy Supplies (FLUTTER) DEVI 1 Device by Does not apply route 3 (three) times daily. 1 each 0   roflumilast (DALIRESP) 500 MCG TABS tablet TAKE ONE TABLET BY MOUTH EVERY MORNING 30 tablet 6   spironolactone (ALDACTONE) 25 MG tablet Take 0.5 tablets (12.5 mg total) by mouth daily. 15 tablet 12   TRELEGY ELLIPTA 200-62.5-25 MCG/ACT AEPB INHALE 1 PUFF BY MOUTH EVERY DAY 60 each 5   zolpidem (AMBIEN) 10 MG tablet Take 10 mg by mouth at bedtime.     famotidine (PEPCID) 20 MG tablet Take 1 tablet (20 mg total) by mouth at bedtime. (Patient not taking: Reported on 05/07/2023) 30 tablet 0   guaiFENesin (MUCINEX) 600 MG 12 hr tablet Take 1 tablet (600 mg total) by mouth 2 (two) times daily as needed for to loosen phlegm. (Patient not taking: Reported on 05/07/2023) 30 tablet 1   doxycycline (VIBRA-TABS) 100 MG tablet Take 1 tablet (100 mg total) by mouth 2 (two) times daily. (Patient not taking: Reported on 05/07/2023) 14 tablet 0   predniSONE (DELTASONE) 10 MG  tablet 4  x 3 days, 3 x 3 days, 2 x 3 days, 1 x 3 days and then stop (Patient not taking: Reported on 05/07/2023) 30 tablet 0   No facility-administered medications prior to visit.      Review of Systems  Review of Systems  Constitutional: Negative.   HENT:  Positive for postnasal drip.   Respiratory:  Positive for cough and shortness of breath.   Cardiovascular: Negative.   Neurological:  Positive for headaches.  Psychiatric/Behavioral:  Positive for sleep disturbance.      Physical Exam  BP 116/60 (BP Location: Left Arm, Patient Position: Sitting, Cuff Size: Large)   Pulse 90   Temp 98.4 F (36.9 C) (Oral)   Ht 5' (1.524 m)   Wt 188 lb 3.2 oz (85.4 kg)   LMP 06/04/2000 (LMP Unknown) Comment: tubal ligation  SpO2 97% Comment: 3L continuous oxygen (tank)  BMI 36.76 kg/m  Physical Exam Constitutional:      Appearance: Normal appearance.  HENT:     Head: Normocephalic and atraumatic.  Cardiovascular:     Rate and Rhythm: Normal rate.  Pulmonary:     Effort: Pulmonary effort is normal.     Breath sounds: No wheezing, rhonchi or rales.  Musculoskeletal:        General: Normal range of motion.  Skin:    General: Skin is warm and dry.  Neurological:     General: No focal deficit present.     Mental Status: She is alert and oriented to person, place, and time. Mental status is at baseline.  Psychiatric:        Mood and Affect: Mood normal.        Behavior: Behavior normal.        Thought Content: Thought content normal.        Judgment: Judgment normal.      Lab Results:  CBC    Component Value Date/Time   WBC 5.7 05/10/2020 1004   WBC 6.2 08/31/2019 1800   RBC 4.30 05/10/2020 1004   RBC 4.07 08/31/2019 1800   HGB 12.3 05/10/2020 1004   HCT 36.9 05/10/2020 1004   PLT 193 05/10/2020 1004   MCV 86 05/10/2020 1004   MCH 28.6 05/10/2020 1004   MCH 28.3 08/31/2019 1800   MCHC 33.3 05/10/2020 1004   MCHC 33.1 08/31/2019 1800   RDW 15.6 (H) 05/10/2020 1004    LYMPHSABS 1.8 08/31/2019 1800   MONOABS 0.6 08/31/2019 1800   EOSABS 0.3 08/31/2019 1800   BASOSABS 0.0 08/31/2019 1800    BMET    Component Value Date/Time   NA 145 (H) 12/19/2022 1126   K 3.8 12/19/2022 1126   CL 101 12/19/2022 1126   CO2 32 (H) 12/19/2022 1126   GLUCOSE 120 (H) 12/19/2022 1126   GLUCOSE 112 (H) 03/15/2022 0941   BUN 10 12/19/2022 1126   CREATININE 0.88 12/19/2022 1126   CALCIUM 10.0 12/19/2022 1126   GFRNONAA 60 05/10/2020 1004   GFRAA 69 05/10/2020 1004    BNP    Component Value Date/Time   BNP 11.3 08/31/2019 1800    ProBNP    Component Value Date/Time   PROBNP <36 06/19/2022 1002   PROBNP 11.0 03/15/2022 0941    Imaging: No results found.   Assessment & Plan:   COPD (chronic obstructive pulmonary disease) (HCC) - Mild exacerbation. She typically responds well to abx and steroids. Continue Trelegy, Daliresp and prn Albuterol. RX Augmentin 1 tab twice daily x 7 days and prednisone taper.  Recommendations Continue Trelegy 1 puff daily Continue Daliresp 250 mcg daily Start Astelin nasal spray twice daily for nasal congestion  Take antibiotic and prednisone as prescribed for starts of bronchitis and COPD flareup   Chronic diastolic CHF (congestive heart failure) (HCC) Previously on Demadex, changed back to furosemide due to side effects. She has some mild pedal edema but nothing significant. Continue to monitor because this impacts her breathing significantly   Dyspnea - Due to restrictive lung disease, intermittent upper airway obstruction, weight and deconditioning   Chronic respiratory failure with hypoxia (HCC) - Stable; Continue 2-3L oxygen at all times to maintain o2> 90%  Hx of sleep apnea - Hx sleep apnea, not currently on CPAP. Having morning headaches and restless sleep. Needs split night sleep study to re-evaluate OSA and pressure settings      Glenford Bayley, NP 05/13/2023

## 2023-05-13 DIAGNOSIS — Z8669 Personal history of other diseases of the nervous system and sense organs: Secondary | ICD-10-CM | POA: Insufficient documentation

## 2023-05-13 NOTE — Assessment & Plan Note (Signed)
-   Hx sleep apnea, not currently on CPAP. Having morning headaches and restless sleep. Needs split night sleep study to re-evaluate OSA and pressure settings

## 2023-05-13 NOTE — Assessment & Plan Note (Addendum)
-   Mild exacerbation. She typically responds well to abx and steroids. Continue Trelegy, Daliresp and prn Albuterol. RX Augmentin 1 tab twice daily x 7 days and prednisone taper.   Recommendations Continue Trelegy 1 puff daily Continue Daliresp 250 mcg daily Start Astelin nasal spray twice daily for nasal congestion  Take antibiotic and prednisone as prescribed for starts of bronchitis and COPD flareup

## 2023-05-13 NOTE — Assessment & Plan Note (Signed)
Previously on Demadex, changed back to furosemide due to side effects. She has some mild pedal edema but nothing significant. Continue to monitor because this impacts her breathing significantly

## 2023-05-13 NOTE — Assessment & Plan Note (Signed)
-   Due to restrictive lung disease, intermittent upper airway obstruction, weight and deconditioning

## 2023-05-13 NOTE — Assessment & Plan Note (Signed)
-   Stable; Continue 2-3L oxygen at all times to maintain o2> 90%

## 2023-05-20 ENCOUNTER — Other Ambulatory Visit: Payer: Self-pay | Admitting: Emergency Medicine

## 2023-05-28 ENCOUNTER — Telehealth: Payer: Self-pay | Admitting: Primary Care

## 2023-05-28 NOTE — Telephone Encounter (Signed)
Patient states Trelegy needs prior authorization. Pharmacy is [facility]. Patient phone number is [phone].

## 2023-05-30 ENCOUNTER — Telehealth: Payer: Self-pay

## 2023-05-30 ENCOUNTER — Other Ambulatory Visit (HOSPITAL_COMMUNITY): Payer: Self-pay

## 2023-05-30 MED ORDER — TRELEGY ELLIPTA 200-62.5-25 MCG/ACT IN AEPB: 1.0000 | INHALATION_SPRAY | Freq: Every day | RESPIRATORY_TRACT | Status: DC

## 2023-05-30 NOTE — Telephone Encounter (Signed)
*  Pulm  PA request received for Trelegy   PA submitted to [employer] and is pending determination  Confirmation: [id number]

## 2023-05-30 NOTE — Telephone Encounter (Signed)
Spoke with the pt  Left sample up front of trelegy 200  Routing to PA team   Please advise on PA for trelegy

## 2023-05-30 NOTE — Telephone Encounter (Signed)
Patient checking on message for Trelegy prior authorization. Patient phone number is [phone].

## 2023-05-30 NOTE — Telephone Encounter (Signed)
PA has been submitted and is pending determination, will be updated in additional encounter created.  

## 2023-06-04 ENCOUNTER — Ambulatory Visit (HOSPITAL_BASED_OUTPATIENT_CLINIC_OR_DEPARTMENT_OTHER): Payer: Medicaid Other | Attending: Primary Care | Admitting: Pulmonary Disease

## 2023-06-04 VITALS — Ht 60.0 in | Wt 186.0 lb

## 2023-06-04 DIAGNOSIS — I110 Hypertensive heart disease with heart failure: Secondary | ICD-10-CM | POA: Insufficient documentation

## 2023-06-04 DIAGNOSIS — I509 Heart failure, unspecified: Secondary | ICD-10-CM | POA: Diagnosis not present

## 2023-06-04 DIAGNOSIS — G479 Sleep disorder, unspecified: Secondary | ICD-10-CM | POA: Insufficient documentation

## 2023-06-04 DIAGNOSIS — J9611 Chronic respiratory failure with hypoxia: Secondary | ICD-10-CM | POA: Diagnosis not present

## 2023-06-04 DIAGNOSIS — E669 Obesity, unspecified: Secondary | ICD-10-CM | POA: Diagnosis not present

## 2023-06-04 DIAGNOSIS — J432 Centrilobular emphysema: Secondary | ICD-10-CM

## 2023-06-04 DIAGNOSIS — J439 Emphysema, unspecified: Secondary | ICD-10-CM | POA: Insufficient documentation

## 2023-06-04 DIAGNOSIS — E119 Type 2 diabetes mellitus without complications: Secondary | ICD-10-CM | POA: Insufficient documentation

## 2023-06-05 DIAGNOSIS — J9611 Chronic respiratory failure with hypoxia: Secondary | ICD-10-CM

## 2023-06-05 NOTE — Procedures (Signed)
     Patient Name: Stephanie Schultz, Stephanie Schultz Date: 06/04/2023 Gender: Female D.O.B: 1956/06/28 Age (years): 7 Referring Provider: Ames Dura NP Height (inches): 60 Interpreting Physician: Coralyn Helling MD, ABSM Weight (lbs): 186 RPSGT: Ulyess Mort BMI: 36 MRN: [id number] Neck Size: 17.00  CLINICAL INFORMATION Sleep Study Type: NPSG  Indication for sleep study: Congestive Heart Failure, COPD, Diabetes, Hypertension, Morning Headaches, Obesity  Epworth Sleepiness Score: 6  Most recent polysomnogram dated 06/01/2018 revealed an AHI of 0.0/h and RDI of 1.6/h.  SLEEP STUDY TECHNIQUE As per the AASM Manual for the Scoring of Sleep and Associated Events v2.3 (April 2016) with a hypopnea requiring 4% desaturations.  The channels recorded and monitored were frontal, central and occipital EEG, electrooculogram (EOG), submentalis EMG (chin), nasal and oral airflow, thoracic and abdominal wall motion, anterior tibialis EMG, snore microphone, electrocardiogram, and pulse oximetry.  MEDICATIONS Medications self-administered by patient taken the night of the study : ALBUTEROL, AMBIEN, GLIPIZIDE, LANTUS, PRAVASTATIN, PREDNISONE, SYMBICORT, ZOLPIDEM TARTRATE  SLEEP ARCHITECTURE The study was initiated at 10:55:22 PM and ended at 5:12:09 AM.  Sleep onset time was 50.4 minutes and the sleep efficiency was 75.1%. The total sleep time was 282.9 minutes.  Stage REM latency was 145.0 minutes.  The patient spent 14.3% of the night in stage N1 sleep, 75.6% in stage N2 sleep, 0.0% in stage N3 and 10.1% in REM.  Alpha intrusion was absent.  Supine sleep was 0.00%.  RESPIRATORY PARAMETERS The overall apnea/hypopnea index (AHI) was 0.0 per hour. There were 0 total apneas, including 0 obstructive, 0 central and 0 mixed apneas. There were 0 hypopneas and 28 RERAs.  The AHI during Stage REM sleep was 0.0 per hour.  AHI while supine was N/A per hour.  The mean oxygen saturation was 98.0%. The  minimum SpO2 during sleep was 95.0%.  The study was conducted with her using 3 liters supplemental oxygen.  Snoring was noted during this study.  CARDIAC DATA The 2 lead EKG demonstrated sinus rhythm. The mean heart rate was 70.8 beats per minute. Other EKG findings include: None.  LEG MOVEMENT DATA The total PLMS were 0 with a resulting PLMS index of 0.0. Associated arousal with leg movement index was 1.1 .  IMPRESSIONS - No significant obstructive sleep apnea occurred during this study (AHI = 0.0/h). - The patient had minimal or no oxygen desaturation during the study (Min O2 = 95.0%).  She wore 3 liters supplemental oxygen during this study. - No snoring was audible during this study. - No cardiac abnormalities were noted during this study. - Clinically significant periodic limb movements did not occur during sleep. No significant associated arousals.  DIAGNOSIS - COPD with Emphysema. - Chronic Respiratory Failure with Hypoxia  RECOMMENDATIONS - Continue 3 liters supplemental oxygen at night. - Avoid alcohol, sedatives and other CNS depressants that may worsen sleep apnea and disrupt normal sleep architecture. - Sleep hygiene should be reviewed to assess factors that may improve sleep quality. - Weight management and regular exercise should be initiated or continued if appropriate.  [Electronically signed] 06/05/2023 10:58 AM  Coralyn Helling MD, ABSM Diplomate, American Board of Sleep Medicine NPI: [id number]  [facility] PH: [phone]   FX: [phone] ACCREDITED BY THE AMERICAN ACADEMY OF SLEEP MEDICINE

## 2023-06-10 ENCOUNTER — Other Ambulatory Visit: Payer: Self-pay | Admitting: Cardiology

## 2023-06-20 NOTE — Telephone Encounter (Signed)
Pharmacy Patient Advocate Encounter  Received notification from [facility] MEDICAID that Prior Authorization for Trelegy has been APPROVED from 05/30/2023 to 05/29/2024

## 2023-06-27 ENCOUNTER — Telehealth: Payer: Self-pay | Admitting: Emergency Medicine

## 2023-06-27 NOTE — Telephone Encounter (Signed)
Called the pt and there was no answer- LMTCB    

## 2023-06-27 NOTE — Telephone Encounter (Signed)
ATC x2.  LVM to return call tomorrow after 8:30 am.

## 2023-06-28 ENCOUNTER — Other Ambulatory Visit: Payer: Self-pay

## 2023-06-28 MED ORDER — PREDNISONE 10 MG PO TABS: ORAL_TABLET | ORAL | 0 refills | Status: DC

## 2023-06-28 MED ORDER — DOXYCYCLINE HYCLATE 100 MG PO TABS: 100.0000 mg | ORAL_TABLET | Freq: Two times a day (BID) | ORAL | 0 refills | Status: DC

## 2023-06-28 NOTE — Telephone Encounter (Signed)
Patient is calling to see if she can have medications prescribed for her COPD flare up. Please call back and advise. [phone].

## 2023-06-28 NOTE — Telephone Encounter (Signed)
Spoke with patient. Went over recommendations from [facility]. Patient advised medications were sent to the wrong pharmacy. I verified where she wanted them to sent. I called [facility] and canceled the other scripts. Closing encounter nfn

## 2023-06-28 NOTE — Telephone Encounter (Signed)
Primary Pulmonologist: Byrum Last office visit and with whom: 05/07/2023 Ames Dura NP What do we see them for (pulmonary problems): chronic respiratory failure with hypoxia, sleep apnea Last OV assessment/plan:   Assessment & Plan Note by Glenford Bayley, NP at 05/13/2023 7:47 AM  Author: Glenford Bayley, NP Author Type: Nurse Practitioner Filed: 05/13/2023  7:47 AM  Note Status: Written Cosign: Cosign Not Required Encounter Date: 05/07/2023  Problem: Chronic respiratory failure with hypoxia [facility])  Editor: Glenford Bayley, NP (Nurse Practitioner)             - Stable; Continue 2-3L oxygen at all times to maintain o2> 90%        Assessment & Plan Note by Glenford Bayley, NP at 05/13/2023 7:46 AM  Author: Glenford Bayley, NP Author Type: Nurse Practitioner Filed: 05/13/2023  7:46 AM  Note Status: Written Cosign: Cosign Not Required Encounter Date: 05/07/2023  Problem: Dyspnea  Editor: Glenford Bayley, NP (Nurse Practitioner)             - Due to restrictive lung disease, intermittent upper airway obstruction, weight and deconditioning         Assessment & Plan Note by Glenford Bayley, NP at 05/13/2023 7:46 AM  Author: Glenford Bayley, NP Author Type: Nurse Practitioner Filed: 05/13/2023  7:46 AM  Note Status: Written Cosign: Cosign Not Required Encounter Date: 05/07/2023  Problem: Chronic diastolic CHF (congestive heart failure) (HCC)  Editor: Glenford Bayley, NP (Nurse Practitioner)             Previously on Demadex, changed back to furosemide due to side effects. She has some mild pedal edema but nothing significant. Continue to monitor because this impacts her breathing significantly         Patient Instructions by Glenford Bayley, NP at 05/07/2023 10:00 AM  Author: Glenford Bayley, NP Author Type: Nurse Practitioner Filed: 05/07/2023 10:30 AM  Note Status: Signed Cosign: Cosign Not Required Encounter Date: 05/07/2023  Editor: Glenford Bayley, NP  (Nurse Practitioner)             Recommendations Continue Trelegy 1 puff daily Continue Daliresp 250 mcg daily Start Astelin nasal spray twice daily for nasal congestion  Take antibiotic and prednisone as prescribed for starts of bronchitis and COPD flareup We will get split-night sleep study to reassess for OSA and possible CPAP needed Continue to wear 2 to 3 L of oxygen 24/7 to maintain O2 greater than 90%   Follow-up 3 months with [facility] NP or sooner if needed       Orthostatic Vitals Recorded in This Encounter   05/07/2023 1006     Patient Position: Sitting  BP Location: Left Arm  Cuff Size: Large   Instructions  Recommendations Continue Trelegy 1 puff daily Continue Daliresp 250 mcg daily Start Astelin nasal spray twice daily for nasal congestion  Take antibiotic and prednisone as prescribed for starts of bronchitis and COPD flareup We will get split-night sleep study to reassess for OSA and possible CPAP needed Continue to wear 2 to 3 L of oxygen 24/7 to maintain O2 greater than 90%   Follow-up 3 months with Beth NP or sooner if needed       Was appointment offered to patient (explain)?  No, recently seen in office.   Reason for call:  Having symptoms for 2 weeks or longer.  Taking albuterol nebs every 4 hours.  She is using her Trelegy inhaler daily.  She  is also taking her Daliresp as prescribed.  Having sob just walking short distances.  Sats as low as 77%, she sits and takes deep breaths and it comes up to 83%.  Uses 3.5 L for a few minutes and it comes back up.  Her normal sats run 93% on 3L.  She is coughing up mucous with some green and yellow in it.  It is very thick and hard to get up.  She did 3-4 covid tests all negative.  No fever, chills or body aches.  She is requesting medication be sent to her pharmacy.  Her preferred pharmacy is [facility].    Beth, please advise.  Thank you.  (examples of things to ask: : When did symptoms start? Fever?  Cough? Productive? Color to sputum? More sputum than usual? Wheezing? Have you needed increased oxygen? Are you taking your respiratory medications? What over the counter measures have you tried?)  Allergies  Allergen Reactions   Acetaminophen Other (See Comments)    Pt was told that she could not take Tylenol, does not know why.    Immunization History  Administered Date(s) Administered   Influenza Split 09/20/2011, 09/12/2012, 08/12/2013   Influenza,inj,Quad PF,6+ Mos 08/03/2014, 08/12/2015, 08/12/2017, 07/29/2018, 09/11/2021   Influenza,inj,Quad PF,6-35 Mos 09/11/2019   PFIZER(Purple Top)SARS-COV-2 Vaccination 02/05/2020, 02/25/2020, 11/08/2020   Pneumococcal Conjugate-13 09/24/2017   Pneumococcal Polysaccharide-23 11/12/2009        Having symptoms for 2 weeks.  Taking breathing treatments.  Having sob just walking short distances.  Sats as low as 77%, she sits and takes deep breaths and it comes up to 83%.  Uses 3.5 L for a few minutes and it comes back up.  Her normal sats run 93% on 3L.  She is coughing up mucous with some green and yellow in it.  It is very thick and hard to get up.  She did 3-4 covid tests all negative.  No fever, chills or body aches.  Her preferred pharmacy is [facility].

## 2023-06-28 NOTE — Telephone Encounter (Signed)
She should turn her oxygen up to 4-5L. If unable to maintain O2 >88-90% needs to go to ED  I will send in abx and prednisone for COPD exacerbation. Continue Trelegy, daliresp and Albuterol as directed

## 2023-07-02 ENCOUNTER — Ambulatory Visit: Payer: Medicaid Other | Admitting: Physician Assistant

## 2023-07-02 ENCOUNTER — Ambulatory Visit: Payer: Medicaid Other | Admitting: Cardiology

## 2023-07-05 ENCOUNTER — Telehealth: Payer: Self-pay | Admitting: Emergency Medicine

## 2023-07-05 NOTE — Telephone Encounter (Signed)
Patient was just released from hospital this past Tuesday. She is currently still experiencing problems such as dizziness. There are no earlier appointments with Dr.Byrum that are in the next week. She is taking medication for her COPD flare up. Please call and advise [phone].

## 2023-07-06 ENCOUNTER — Other Ambulatory Visit: Payer: Self-pay | Admitting: Emergency Medicine

## 2023-07-12 NOTE — Telephone Encounter (Signed)
Left message for patient to follow up with PCP until she can be seen in the office.

## 2023-07-25 ENCOUNTER — Other Ambulatory Visit: Payer: Self-pay

## 2023-07-25 ENCOUNTER — Encounter (HOSPITAL_COMMUNITY): Payer: Self-pay

## 2023-07-25 ENCOUNTER — Emergency Department (HOSPITAL_COMMUNITY): Payer: Medicaid Other

## 2023-07-25 ENCOUNTER — Emergency Department (HOSPITAL_COMMUNITY)
Admission: EM | Admit: 2023-07-25 | Discharge: 2023-07-25 | Disposition: A | Discharge status: Home / Self Care | Payer: Medicaid Other | Attending: Emergency Medicine | Admitting: Emergency Medicine

## 2023-07-25 DIAGNOSIS — Z7982 Long term (current) use of aspirin: Secondary | ICD-10-CM | POA: Insufficient documentation

## 2023-07-25 DIAGNOSIS — I110 Hypertensive heart disease with heart failure: Secondary | ICD-10-CM | POA: Insufficient documentation

## 2023-07-25 DIAGNOSIS — I5032 Chronic diastolic (congestive) heart failure: Secondary | ICD-10-CM | POA: Diagnosis not present

## 2023-07-25 DIAGNOSIS — R0602 Shortness of breath: Secondary | ICD-10-CM | POA: Diagnosis present

## 2023-07-25 DIAGNOSIS — Z79899 Other long term (current) drug therapy: Secondary | ICD-10-CM | POA: Insufficient documentation

## 2023-07-25 DIAGNOSIS — E119 Type 2 diabetes mellitus without complications: Secondary | ICD-10-CM | POA: Diagnosis not present

## 2023-07-25 DIAGNOSIS — Z8541 Personal history of malignant neoplasm of cervix uteri: Secondary | ICD-10-CM | POA: Diagnosis not present

## 2023-07-25 DIAGNOSIS — Z7951 Long term (current) use of inhaled steroids: Secondary | ICD-10-CM | POA: Insufficient documentation

## 2023-07-25 DIAGNOSIS — J441 Chronic obstructive pulmonary disease with (acute) exacerbation: Secondary | ICD-10-CM | POA: Insufficient documentation

## 2023-07-25 LAB — D-DIMER, QUANTITATIVE: D-Dimer, Quant: 0.47 ug{FEU}/mL (ref 0.00–0.50)

## 2023-07-25 LAB — BRAIN NATRIURETIC PEPTIDE: B Natriuretic Peptide: 10.8 pg/mL (ref 0.0–100.0)

## 2023-07-25 LAB — CBC WITH DIFFERENTIAL/PLATELET
Abs Immature Granulocytes: 0.01 10*3/uL (ref 0.00–0.07)
Basophils Absolute: 0.1 10*3/uL (ref 0.0–0.1)
Basophils Relative: 1 %
Eosinophils Absolute: 0 10*3/uL (ref 0.0–0.5)
Eosinophils Relative: 0 %
HCT: 35.4 % — ABNORMAL LOW (ref 36.0–46.0)
Hemoglobin: 11.6 g/dL — ABNORMAL LOW (ref 12.0–15.0)
Immature Granulocytes: 0 %
Lymphocytes Relative: 28 %
Lymphs Abs: 2.3 10*3/uL (ref 0.7–4.0)
MCH: 27.8 pg (ref 26.0–34.0)
MCHC: 32.8 g/dL (ref 30.0–36.0)
MCV: 84.9 fL (ref 80.0–100.0)
Monocytes Absolute: 0.6 10*3/uL (ref 0.1–1.0)
Monocytes Relative: 7 %
Neutro Abs: 5.1 10*3/uL (ref 1.7–7.7)
Neutrophils Relative %: 64 %
Platelets: 281 10*3/uL (ref 150–400)
RBC: 4.17 MIL/uL (ref 3.87–5.11)
RDW: 13.4 % (ref 11.5–15.5)
WBC: 8 10*3/uL (ref 4.0–10.5)
nRBC: 0 % (ref 0.0–0.2)

## 2023-07-25 LAB — BASIC METABOLIC PANEL
Anion gap: 8 (ref 5–15)
BUN: 13 mg/dL (ref 8–23)
CO2: 36 mmol/L — ABNORMAL HIGH (ref 22–32)
Calcium: 9.5 mg/dL (ref 8.9–10.3)
Chloride: 98 mmol/L (ref 98–111)
Creatinine, Ser: 1.03 mg/dL — ABNORMAL HIGH (ref 0.44–1.00)
GFR, Estimated: >60 mL/min (ref >60)
Glucose, Bld: 97 mg/dL (ref 70–99)
Potassium: 3.5 mmol/L (ref 3.5–5.1)
Sodium: 142 mmol/L (ref 135–145)

## 2023-07-25 LAB — BLOOD GAS, VENOUS
Acid-Base Excess: 11 mmol/L — ABNORMAL HIGH (ref 0.0–2.0)
Bicarbonate: 39.5 mmol/L — ABNORMAL HIGH (ref 20.0–28.0)
O2 Saturation: 49.1 %
Patient temperature: 37
pCO2, Ven: 70 mmHg — ABNORMAL HIGH (ref 44–60)
pH, Ven: 7.36 (ref 7.25–7.43)
pO2, Ven: 31 mmHg — CL (ref 32–45)

## 2023-07-25 MED ORDER — PREDNISONE 20 MG PO TABS: 40.0000 mg | ORAL_TABLET | Freq: Every day | ORAL | 0 refills | Status: AC

## 2023-07-25 MED ORDER — AZITHROMYCIN 250 MG PO TABS: 250.0000 mg | ORAL_TABLET | Freq: Every day | ORAL | 0 refills | Status: DC

## 2023-07-25 MED ORDER — IPRATROPIUM-ALBUTEROL 0.5-2.5 (3) MG/3ML IN SOLN
3.0000 mL | RESPIRATORY_TRACT | Status: AC
  Administered 2023-07-25 (×3): 3 mL via RESPIRATORY_TRACT
  Filled 2023-07-25: qty 9

## 2023-07-25 MED ORDER — METHYLPREDNISOLONE SODIUM SUCC 125 MG IJ SOLR
125.0000 mg | Freq: Once | INTRAMUSCULAR | Status: AC
  Administered 2023-07-25: 125 mg via INTRAVENOUS
  Filled 2023-07-25: qty 2

## 2023-07-25 MED ORDER — PREDNISONE 20 MG PO TABS: 40.0000 mg | ORAL_TABLET | Freq: Every day | ORAL | 0 refills | Status: DC

## 2023-07-25 NOTE — ED Provider Notes (Signed)
[place] EMERGENCY DEPARTMENT AT [facility] Provider Note   CSN: [id number] Arrival date & time: 07/25/23  1757     History  Chief Complaint  Patient presents with   Shortness of Breath    Stephanie Schultz is a 67 y.o. female with PMH as listed below who presents with shortness of breath for 3 months. Wears 3L [place] at baseline. Shortness of breath worsens when ambulating. Has COPD and has been admitted in the hospital as well as seen by her pulmonologist several times for these issues and she does not feel like her symptoms have improved.  She was treated with a course of antibiotics as well as a short course of steroids that did not make a difference.  She is not currently taking antibiotics or steroids.  She states she does not lie flat at baseline and cannot comment on orthopnea.  She endorses tightness in her chest but no chest pain.  Denies any new or worsening lower extremity edema, fever/chills.  Endorses a cough productive of yellow mucus but states that the mucus is at its baseline.  Endorses very mild small streaks of hemoptysis.  Denies nausea vomiting diarrhea constipation, abdominal pain.  She states that she presented here today because her symptoms have does not gone away.   Past Medical History:  Diagnosis Date   Anemia    chronis   Anxiety    Arthritis    Cervical cancer (HCC) 1990   cervical    Chronic diastolic CHF (congestive heart failure) (HCC) 08/10/2016   COPD (chronic obstructive pulmonary disease) (HCC)    O2 dependent. Pulmo: Dr. Delton Coombes   Diabetes mellitus without complication (HCC)    a. A1c 8.3 in 11/2015   Emphysema    Gallstones    s/p cholecystectomy   GERD (gastroesophageal reflux disease)    Gout    History of home oxygen therapy    "2.5L; 24/7" (08/10/2016)   Hx of cardiac catheterization    a. LHC at [facility] in [place], [place] 09/2008:  Normal coronary arteries EF 70%. b. LHC 11/2016: normal cors, normal LVEDP, EF 55-65%.    Hyperlipidemia    Hypertension    Hypoxia 11/17/2018   Morbid obesity (HCC)    OSA on CPAP    CPAP at night    Pneumonia    Sickle cell trait (HCC)    Tobacco abuse    a. up to 3ppd from age 34 to 59, now 1/4 ppd (01/2013) >> Quit 10/2015       Home Medications Prior to Admission medications   Medication Sig Start Date End Date Taking? Authorizing Provider  albuterol (PROVENTIL) (2.5 MG/3ML) 0.083% nebulizer solution USE 1 VIAL VIA NEBULIZER 4 TIMES DAILY AS NEEDED. 01/09/23   Leslye Peer, MD  albuterol (VENTOLIN HFA) 108 (90 Base) MCG/ACT inhaler Inhale 2 puffs into the lungs every 4 (four) hours as needed for wheezing or shortness of breath. 06/15/22   Glenford Bayley, NP  allopurinol (ZYLOPRIM) 100 MG tablet Take 100 mg by mouth daily.    [provider]  ALPRAZolam Prudy Feeler) 1 MG tablet Take 1 mg by mouth at bedtime as needed for sleep.  04/13/16   [provider]  aspirin EC 81 MG tablet Take 81 mg by mouth daily.    [provider]  azelastine (ASTELIN) 0.1 % nasal spray Place 2 sprays into both nostrils 2 (two) times daily. Use in each nostril as directed 05/07/23   Glenford Bayley,  NP  azithromycin (ZITHROMAX) 250 MG tablet Take 1 tablet (250 mg total) by mouth daily. Take first 2 tablets together, then 1 every day until finished. 07/25/23   Loetta Rough, MD  benzonatate (TESSALON) 100 MG capsule Take 2 capsules (200 mg total) by mouth 3 (three) times daily as needed for cough. 10/30/22   Leslye Peer, MD  cetirizine (ZYRTEC) 10 MG tablet Take 10 mg by mouth daily. 08/14/19   [provider]  Cholecalciferol (VITAMIN D PO) Take 1 tablet by mouth daily.    [provider]  clobetasol cream (TEMOVATE) 0.05 % Apply 1 application topically daily as needed (irritation from pull ups).  03/25/17   [provider]  doxycycline (VIBRA-TABS) 100 MG tablet Take 1 tablet (100 mg total) by mouth 2 (two) times daily. 06/28/23   Glenford Bayley, NP  famotidine (PEPCID) 20 MG tablet Take 1 tablet (20 mg total) by mouth at bedtime. Patient not taking: Reported on 05/07/2023 02/07/22   Glenford Bayley, NP  fluticasone [facility]) 50 MCG/ACT nasal spray Place 1 spray into both nostrils daily as needed (congestion). 03/28/23   Leslye Peer, MD  Fluticasone-Umeclidin-Vilant (TRELEGY ELLIPTA) 200-62.5-25 MCG/ACT AEPB Inhale 1 puff into the lungs daily. 05/30/23   Leslye Peer, MD  furosemide (LASIX) 40 MG tablet Take 40 mg (1 tablet) by mouth in the morning and 20 mg (1/2 tablet) by mouth in the evening 10/30/22   Sharlene Dory, PA-C  gabapentin (NEURONTIN) 300 MG capsule Take 300 mg by mouth 3 (three) times daily. 05/23/21   [provider]  guaiFENesin (MUCINEX) 600 MG 12 hr tablet Take 1 tablet (600 mg total) by mouth 2 (two) times daily as needed for to loosen phlegm. Patient not taking: Reported on 05/07/2023 12/28/21   Glenford Bayley, NP  HYDROcodone-acetaminophen (NORCO) 7.5-325 MG tablet Take 1 tablet by mouth every 6 (six) hours as needed for moderate pain.    [provider]  linaclotide (LINZESS) 145 MCG CAPS capsule Take 145 mcg by mouth daily.    [provider]  meclizine (ANTIVERT) 25 MG tablet Take 25 mg by mouth every 6 (six) hours as needed. 05/21/19   [provider]  metoprolol succinate (TOPROL-XL) 50 MG 24 hr tablet TAKE ONE TABLET BY MOUTH EVERY MORNING 06/12/23   Quintella Reichert, MD  OXYGEN Inhale 3 L into the lungs continuous.    [provider]  pantoprazole (PROTONIX) 40 MG tablet Take 1 tablet (40 mg total) by mouth 2 (two) times daily. 06/19/22   Sharlene Dory, PA-C  potassium chloride SA (KLOR-CON M) 20 MEQ tablet TAKE ONE TABLET BY MOUTH EVERY MORNING 06/12/23   Quintella Reichert, MD  predniSONE (DELTASONE) 20 MG tablet Take 2 tablets (40 mg total) by mouth daily for 4 days. 07/26/23 07/30/23  Loetta Rough, MD  Respiratory Therapy Supplies (FLUTTER) DEVI 1  Device by Does not apply route 3 (three) times daily. 12/03/18   Ivonne Andrew, NP  roflumilast (DALIRESP) 500 MCG TABS tablet TAKE ONE TABLET BY MOUTH EVERY MORNING 07/09/23   Leslye Peer, MD  spironolactone (ALDACTONE) 25 MG tablet Take 0.5 tablets (12.5 mg total) by mouth daily. 07/13/21   Quintella Reichert, MD  TRELEGY ELLIPTA 200-62.5-25 MCG/ACT AEPB INHALE 1 PUFF BY MOUTH EVERY DAY 05/21/23   Leslye Peer, MD  zolpidem (AMBIEN) 10 MG tablet Take 10 mg by mouth at bedtime.    [provider]      Allergies    Acetaminophen    Review of Systems   Review of Systems A 10 point review of systems was performed and is negative unless otherwise reported in HPI.  Physical Exam Updated Vital Signs BP 132/60 (BP Location: Left Arm)   Pulse 88   Temp 98.3 F (36.8 C) (Oral)   Resp 16   Ht 5' (1.524 m)   Wt 86.2 kg   LMP 06/04/2000 (LMP Unknown) Comment: tubal ligation  SpO2 100%   BMI 37.11 kg/m  Physical Exam General: Normal appearing obese female, sitting in bed.  HEENT: Sclera anicteric, MMM, trachea midline.  Cardiology: RRR, no murmurs/rubs/gallops. BL radial and DP pulses equal bilaterally.  Resp: Normal respiratory rate and effort.  Tight lung sounds bilaterally with minimal bibasilar wheezing .  No significant increased work of breathing or tachypnea.  On 3 L nasal cannula. Abd: Soft, non-tender, non-distended. No rebound tenderness or guarding.  GU: Deferred. MSK: No peripheral edema or signs of trauma Skin: warm, dry.  Neuro: A&Ox4, CNs II-XII grossly intact. MAEs. Sensation grossly intact.  Psych: Normal mood and affect.   ED Results / Procedures / Treatments   Labs (all labs ordered are listed, but only abnormal results are displayed) Labs Reviewed  BASIC METABOLIC PANEL - Abnormal; Notable for the following components:      Result Value   CO2 36 (*)    Creatinine, Ser 1.03 (*)    All other components within normal limits  CBC WITH  DIFFERENTIAL/PLATELET - Abnormal; Notable for the following components:   Hemoglobin 11.6 (*)    HCT 35.4 (*)    All other components within normal limits  BLOOD GAS, VENOUS - Abnormal; Notable for the following components:   pCO2, Ven 70 (*)    pO2, Ven 31 (*)    Bicarbonate 39.5 (*)    Acid-Base Excess 11.0 (*)    All other components within normal limits  BRAIN NATRIURETIC PEPTIDE  D-DIMER, QUANTITATIVE    EKG EKG Interpretation Date/Time:  Thursday July 25 2023 18:06:13 EDT Ventricular Rate:  79 PR Interval:  146 QRS Duration:  89 QT Interval:  405 QTC Calculation: 465 R Axis:   61  Text Interpretation: Sinus rhythm Confirmed by Vivi Barrack [phone]) on 07/25/2023 7:57:05 PM  Radiology DG Chest 2 View  Result Date: 07/25/2023 CLINICAL DATA:  Shortness of breath EXAM: CHEST - 2 VIEW COMPARISON:  Chest x-ray 01/11/2022.  CT of the chest 01/23/2023 FINDINGS: The heart size and mediastinal contours are within normal limits. Both lungs are clear. There are healed left-sided rib fractures. No acute fractures are seen. IMPRESSION: 1. No active cardiopulmonary disease. 2. Healed left-sided rib fractures. Electronically Signed   By: Darliss Cheney M.D.   On: 07/25/2023 20:14    Procedures Procedures    Medications Ordered in ED Medications  ipratropium-albuterol (DUONEB) 0.5-2.5 (3) MG/3ML nebulizer solution 3 mL (3 mLs Nebulization Given 07/25/23 2007)  methylPREDNISolone sodium succinate (SOLU-MEDROL) 125 mg/2 mL injection 125 mg (125 mg Intravenous Given 07/25/23 2000)    ED Course/ Medical Decision Making/ A&P                          Medical Decision Making Amount and/or Complexity of Data Reviewed Labs: ordered. Decision-making details documented in ED Course. Radiology: ordered. Decision-making details documented in ED Course.  Risk Prescription drug management.    This patient presents to the ED for concern  of dyspnea x 3 months; this involves an extensive  number of treatment options, and is a complaint that carries with it a high risk of complications and morbidity.  I considered the following differential and admission for this acute, potentially life threatening condition.   MDM:    DDX for dyspnea includes but is not limited to:  Consider likely a COPD exacerbation as patient notes that her symptoms are similar to prior COPD exacerbations in the past and consistent over the last 3 months.  She did not note any improvement with steroids and antibiotics the last time so must consider other etiologies including PE, and patient does have a risk factor of a recent admission to the hospital, however dimer is negative and patient has no signs or symptoms of DVT on exam so overall less likely.  She states she cannot lie flat at baseline and will assess for pulm and edema on chest x-ray, however she does not appear peripherally volume overloaded.  EKG does not demonstrate any signs of arrhythmia and she has no chest pain to indicate ACS.  She is coughing up yellow sputum, consider pneumonia, chronic bronchitis, COVID, other viral upper respiratory syndrome.  Patient has been stable on her home oxygen, she does not have any respiratory distress or tachypnea here.  Will start treatment with 3 DuoNebs and Solu-Medrol and reassess.   Clinical Course as of 07/25/23 2223  Thu Jul 25, 2023  1955 pCO2, Peter Minium(!): 70 Pt has chronic COPD on 3L [place]. Unclear where she normally sits for pCO2. She is not sleepy or altered in anyway now. With a value of 70 she is likely baseline hypercarbic, and elevated bicarb indicates compensated respiratory acidosis which would not be an acute process. Will CTM closely. [HN]  1955 D-Dimer, Quant: 0.47 Neg dimer [HN]  1956 CBC with Differential(!) Unremarkable in the context of this patient's presentation  [HN]  1956 Basic metabolic panel(!) Mildly elevated bicarb, otherwise unremarkable [HN]  2033 B Natriuretic Peptide: 10.8 neg [HN]   2034 DG Chest 2 View 1. No active cardiopulmonary disease. 2. Healed left-sided rib fractures.   [HN]    Clinical Course User Index [HN] Loetta Rough, MD    Lab workup is reassuring, BNP is negative and chest x-ray is negative for any pneumonia.  Dimer is negative, no concern for PE. On reevaluation after DuoNebs and Solu-Medrol patient reports improvement of her symptoms.  I had a long discussion with the patient about her COPD and that if her symptoms have worsened over the last 3 months this could indicate a chronic worsening of her COPD versus an acute exacerbation.  I encouraged her to follow-up with her pulmonologist as an outpatient as they may make adjustments to her inhalers or medications.  She will be discharged with prednisone and a short course of azithromycin as her most recent antibiotic treated in the hospital was doxycycline.  Patient reports understanding.    Labs: I Ordered, and personally interpreted labs.  The pertinent results include: Those listed above  Imaging Studies ordered: I ordered imaging studies including chest x-ray I independently visualized and interpreted imaging. I agree with the radiologist interpretation  Additional history obtained from chart review.  External records from outside source obtained and reviewed including [facility] from her most recent admission  Cardiac Monitoring: The patient was maintained on a cardiac monitor.  I personally viewed and interpreted the cardiac monitored which showed an underlying rhythm of: Normal sinus rhythm  Reevaluation: After the interventions noted  above, I reevaluated the patient and found that they have : Improved  Social Determinants of Health:  lives independently  Disposition: DC with discharge instructions, return precautions, instructions to follow-up with her pulmonologist within 1 to 2 weeks  Co morbidities that complicate the patient evaluation  Past Medical History:  Diagnosis Date    Anemia    chronis   Anxiety    Arthritis    Cervical cancer (HCC) 1990   cervical    Chronic diastolic CHF (congestive heart failure) (HCC) 08/10/2016   COPD (chronic obstructive pulmonary disease) (HCC)    O2 dependent. Pulmo: Dr. Delton Coombes   Diabetes mellitus without complication (HCC)    a. A1c 8.3 in 11/2015   Emphysema    Gallstones    s/p cholecystectomy   GERD (gastroesophageal reflux disease)    Gout    History of home oxygen therapy    "2.5L; 24/7" (08/10/2016)   Hx of cardiac catheterization    a. LHC at [facility] in [place], [place] 09/2008:  Normal coronary arteries EF 70%. b. LHC 11/2016: normal cors, normal LVEDP, EF 55-65%.   Hyperlipidemia    Hypertension    Hypoxia 11/17/2018   Morbid obesity (HCC)    OSA on CPAP    CPAP at night    Pneumonia    Sickle cell trait (HCC)    Tobacco abuse    a. up to 3ppd from age 82 to 75, now 1/4 ppd (01/2013) >> Quit 10/2015     Medicines Meds ordered this encounter  Medications   ipratropium-albuterol (DUONEB) 0.5-2.5 (3) MG/3ML nebulizer solution 3 mL   methylPREDNISolone sodium succinate (SOLU-MEDROL) 125 mg/2 mL injection 125 mg    IV methylprednisolone will be converted to either a q12h or q24h frequency with the same total daily dose (TDD).  Ordered Dose: 1 to 125 mg TDD; convert to: TDD q24h.  Ordered Dose: 126 to 250 mg TDD; convert to: TDD div q12h.  Ordered Dose: >250 mg TDD; DAW.   DISCONTD: predniSONE (DELTASONE) 20 MG tablet    Sig: Take 2 tablets (40 mg total) by mouth daily for 4 days.    Dispense:  8 tablet    Refill:  0   DISCONTD: azithromycin (ZITHROMAX) 250 MG tablet    Sig: Take 1 tablet (250 mg total) by mouth daily. Take first 2 tablets together, then 1 every day until finished.    Dispense:  6 tablet    Refill:  0   azithromycin (ZITHROMAX) 250 MG tablet    Sig: Take 1 tablet (250 mg total) by mouth daily. Take first 2 tablets together, then 1 every day until finished.    Dispense:  6 tablet    Refill:  0    predniSONE (DELTASONE) 20 MG tablet    Sig: Take 2 tablets (40 mg total) by mouth daily for 4 days.    Dispense:  8 tablet    Refill:  0    I have reviewed the patients home medicines and have made adjustments as needed  Problem List / ED Course: Problem List Items Addressed This Visit   None Visit Diagnoses     COPD exacerbation (HCC)    -  Primary   Relevant Medications   ipratropium-albuterol (DUONEB) 0.5-2.5 (3) MG/3ML nebulizer solution 3 mL (Completed)   methylPREDNISolone sodium succinate (SOLU-MEDROL) 125 mg/2 mL injection 125 mg (Completed)   azithromycin (ZITHROMAX) 250 MG tablet   predniSONE (DELTASONE) 20 MG tablet (Start on 07/26/2023)  This note was created using dictation software, which may contain spelling or grammatical errors.    Loetta Rough, MD 07/25/23 2225

## 2023-07-25 NOTE — ED Triage Notes (Signed)
Patient has been short of breath for 3 months. Wears 3L [place] at baseline. Shortness of breath worsens when ambulating. Has COPD.

## 2023-07-25 NOTE — Discharge Instructions (Signed)
Thank you for coming to [facility] Emergency Department. You were seen for shortness of breath. We did an exam, labs, and imaging, and these showed likely a COPD exacerbation. We have prescribed a few days of prednisone and azithromycin.  Please follow up with your pulmonologist on 9/25.   Do not hesitate to return to the ED or call 911 if you experience: -Worsening symptoms -Lightheadedness, passing out -Fevers/chills -Anything else that concerns you

## 2023-07-26 ENCOUNTER — Telehealth: Payer: Self-pay | Admitting: Primary Care

## 2023-07-26 NOTE — Telephone Encounter (Signed)
Fax received from Dr. Mindi Slicker with [facility] ([phone]) to perform a Hysteroscopy D&C possible myosure lite on patient.  Patient needs surgery clearance. Surgery is pending. Patient was seen on 05/07/23. Office protocol is a risk assessment can be sent to surgeon if patient has been seen in 60 days or less.   Pt has upcoming appt with Buelah Manis, NP for 08/07/23   Sending to [facility] for risk assessment or recommendations if patient needs to be seen in office prior to surgical procedure.

## 2023-08-07 ENCOUNTER — Ambulatory Visit (INDEPENDENT_AMBULATORY_CARE_PROVIDER_SITE_OTHER): Payer: Medicaid Other | Admitting: Primary Care

## 2023-08-07 ENCOUNTER — Encounter: Payer: Self-pay | Admitting: Primary Care

## 2023-08-07 VITALS — BP 130/56 | HR 96 | Temp 98.7°F | Ht 60.0 in | Wt 186.0 lb

## 2023-08-07 DIAGNOSIS — Z23 Encounter for immunization: Secondary | ICD-10-CM | POA: Diagnosis not present

## 2023-08-07 DIAGNOSIS — J9611 Chronic respiratory failure with hypoxia: Secondary | ICD-10-CM

## 2023-08-07 DIAGNOSIS — J441 Chronic obstructive pulmonary disease with (acute) exacerbation: Secondary | ICD-10-CM

## 2023-08-07 DIAGNOSIS — Z8669 Personal history of other diseases of the nervous system and sense organs: Secondary | ICD-10-CM

## 2023-08-07 MED ORDER — BREZTRI AEROSPHERE 160-9-4.8 MCG/ACT IN AERO: 2.0000 | INHALATION_SPRAY | Freq: Two times a day (BID) | RESPIRATORY_TRACT | Status: DC

## 2023-08-07 MED ORDER — AZITHROMYCIN 250 MG PO TABS: 250.0000 mg | ORAL_TABLET | ORAL | 5 refills | Status: DC

## 2023-08-07 NOTE — Patient Instructions (Addendum)
  Symptoms appear stable currently but due to recurrent exacerbations of your COPD/chronic bronchitis recommend trial different inhaler and add daily azithromycin   Your sleep study in July 2024 was negative for obstructive sleep apnea, no events were noted. Oxygen levels were stable on 3 L . You do not need CPAP.  You did have snoring symptoms.  Work on weight loss efforts and focus on side sleeping position or elevate head of bed 30 degrees.   Ok to proceed with hysteroscopy D&C with Dr. Mindi Slicker with [facility]  Recommendations  - Hold Trelegy while using sample new inhaler  - Trial Breztri two puffs morning and evening (rinse mouth after use) - Start azithromycin Monday, Wednesday, Friday (to prevent exacerbations)  - Please take over the counter Zyrtec-D for 5-7 days (monitor BP, if greater than 140/90 consistently please stop medication)   - Continue Daliresp 500 mcg daily - Continue Astelin nasal spray - Continue to use flutter valve/start using incentive spirometer 2-3 times a day to encourage deep breathing - Ok to receive flu shot today    Follow-up: - 4-6 weeks with Dr. Delton Coombes or Waynetta Sandy NP (needs EKG) - - ok to double book if not opens with Beth on a day where I am not already

## 2023-08-07 NOTE — Progress Notes (Signed)
Patient seen in the office today and instructed on use of flutter valve and Breztri inhaler.  Patient expressed understanding and demonstrated technique.

## 2023-08-07 NOTE — Assessment & Plan Note (Addendum)
-   In lab sleep study 06/04/23 was negative for obstructive sleep apnea, AHI 0.0/hour

## 2023-08-07 NOTE — Assessment & Plan Note (Addendum)
-   Stable; Continue 3L supplemental oxygen

## 2023-08-07 NOTE — Assessment & Plan Note (Addendum)
-   Recurrent exacerbations, last seen in ED on 07/25/23. She is feeling better. Symptoms are not acutely exacerbated but due to recurrent flare ups of chronic bronchitis recommend trial Breztri Aerosphere two puffs twice daily and add daily azithromycin. Continue Daliresp daily. Ok to proceed with hysteroscopy D&C with Dr. Mindi Slicker with [facility]

## 2023-08-07 NOTE — Progress Notes (Signed)
@Patient  ID: Stephanie Schultz, female    DOB: 10-Mar-1956, 67 y.o.   MRN: [id number]  Chief Complaint  Patient presents with   Follow-up    Referring provider: Rometta Emery, MD  HPI: 67 year old female, former smoker quit in 2016 (108-pack-year history).  Significant for COPD component, chronic cough, chronic respiratory failure O2 dependent, pulmonary nodule, allergic rhinitis, chronic diastolic heart failure, hypertension, GERD, type 2 diabetes, obesity.  Patient of Dr. Delton Coombes. Maintained on Trelegy, Daliresp, prn albuterol and continuous oxygen at 3-4L/min.  Previous [facility] pulmonary encounters: 08/24/20- ROV, Dr. Delton Coombes  Follow-up visit for 67 year old woman with very severe COPD with asthmatic features.  Also with superimposed upper airway irritation syndrome and chronic cough.  Followed for hypertension and diastolic dysfunction, obesity.  She has chronic hypoxemia on 2 to 3 L/min with documented nocturnal hypoxemia, no OSA. She reports today that she is doing pretty well. She is on daliresp for the last 2 months, on Trelegy. Has albuterol that she uses . She is on flonase, zyrtec. On esomeprazole. Her last flare was over 6 months ago. COVID vaccine up to date, Flu up to date. Needs PNA shot after age 10 PFT done today reviewed by me show very severe obstruction. Restricted volumes, decreased diffusion capacity.   She needs 6 more green e-tanks for her O2 from [employer].   03/14/2021  Patient presents today for 6 month follow-up COPD.  Her breathing has more some worse over the last two week with np cough. Feels it is related to seasonal allergies. She is using flonase nasal spray daily. Asking to try tesslon perles. She has not had a recent exacerbation, prior to being on Daliresp she was experiencing frequent flare ups. Maintained on Trelegy 100, prn albuterol and Daliresp. She is on oxygen using 2-4L. She has lost 30 lbs, she has been eating salads and is exercising with bicycle /  weights. She states that she can do more with her daughter/grandchildren and can walker longer distances. She is off all her diabetes medications.   09/20/2021 Patient presents today for 6 month follow-up. She is doing very well. She has had significantly less amount of exacerbations his past year since starting Daliresp. She was hospitalized overnight at [facility] hospital in September for COPD. She is doing well today. She has no significant shortness of breath. She keeps a chronic cough, tessalon perles help. She is compliant with Trelegy and daliresp. She uses albuterol nebulizer morning and evening. She is on 2.5-3L oxygen at rest and 4L with exertion. No increased O2 demand.  Dyspnea: None  Cough: Chronic cough  SABA: Use twice a day scheduled  Oxygen: 2.5-4L Nocturnal: 1-2 pillows  12/19/2021 Patient contacted today for acute virtual/video visit. Following with our office for hx COPD and chronic respiratory failure. Patient reports symptoms of cough, shortness of breath and chest tightness over x 6 weeks. Breathing is worse at night. Cough is productive with green-yellow mucus. She is maintained on Trelegy , Daliresp, prn albuterol and continuous oxygen at 3-4L/min. She was last hospitalized in September for COPD exacerbation.   12/28/2021  Patient presents today for 6 week follow-up. She was treated for acute exacerbation last week with Doxycycline and prednisone taper without significant improvement. She still has cough with yellow mucus along with chest tightness, nasal congestion and ear fullness. She has not tried taking mucinex and is not currently using Flonase. She is taking Trelegy and Daliresp as prescribed. She is using her albuterol 4-5 times a day.  On 3L oxygen 24/7.   01/11/2022 Patient presents today for 2 week follow-up prolonged COPD exacerbation. During last visit we increased her Trelegy to . Ordered CXR and sputum culture. Advised she start mucinex and flutter  valve.   She is doing some better. She completed course of doxycyline and prednisone in early march. She still has a cough and some chest tightness but states that it is not as bad as last visit. She has been taking mucinex and flutter valve. Cough is productive with yellow-green mucus, she has not yet been able to provide sputum sample. She was not able to get higher dose Trelegy d/t insurance. We will give her a sample today for her to try. She is on 3L oxygen at rest. Patient reports being inactive and basically home bound before starting on Daliresp. She is able to do much more and is pursing her GED.   02/07/2022 Patient called our office on 3/10 with reports of morning shortness of breath, wheezing and productive cough with yellow mucus. She was sent in course of Doxycycline. She has not been able to provide [place] with a sputum culture. She is doing better. Her only complaint is chest tightness, thinks it is related to reflux. She is taking nexium 40mg  daily in the morning. Using 2L supplemental oxygen at rest and 3L on exertion. She has very severe airflow limitation on pulmonary function testing. No significant BD response.   03/15/2022  Patient presents today for 2 week follow-up. She is doing well. No recent exacerbations, last one was in Feb/March. During last visit we gave her a sample of Breztri to try. She noticed increased fatigue and chest tightness while taking Breztri. Some days she has lots of energy and other days she does not. She has since resumed Trelegy one puff daily. She still has an occasional cough, relates it to the pollen.   No significant mucus production. She takes zyrtec daily and uses flonase as needed. She uses albuterol nebulizer morning and evening. Chest tightness has resolved. She has been taking reflux medication as directed. She is worried she is not voiding as much as she should be. She takes lasix 60mg  daily in the morning, no significant leg swelling. Some eye  puffiness. CAT score 14.    05/07/2023 Patient presents today for an overdue follow-up.  She was last seen by Dr. Delton Coombes on 10/30/2022 shortness of breath.  Dyspnea felt to be multifactorial.  Maintained on Trelegy, as needed albuterol and Daliresp. She does benefit from prednisone and azithromycin  She feels she at the beginnings of getting an upper respiratory infection. She feels little more congestion. She has a productive cough with green mucus. She is getting up a lot of phlegm. Her breathing is stable. Using trelegy inhaler daily. She does not have to use it much, she did use it this morning. Sometimes she just includes it with her morning medication due to morning cough. She continues Daliresp. Flonase causes nose bleed.   She takes Ambien at bedtime for insomnia. Bedtime is 9pm. It takes her 60 mins to fall asleep on average. She starts her day at 7:30am. She has been waking up with headaches. Her sleep can be restless. No significant daytime sleepiness. She does not take naps.   She is taking lasix. When she does not take she feels bloated. She follows 2L fluid restriction and low sodium diet. Weighs herself daily, weight is up 1.5lbs. She has some slight swelling to top of her feet, nothing  significant.     08/07/2023- Interim hx  Patient presents today for surgical risk assessment. She is planning for upcoming hysteroscopy D&C with Dr. Mindi Slicker with [facility].   Patient had split night sleep study on 06/04/23 showed no evidence of OSA, AHI 0.0/hour. Mean oxygen saturation was 98% with minimum SPO2 95%. She used 3L supplemental oxygen. Snoring was noted during sleep study.   She was admitted to [facility] Aug 17-20th for AECOPD. She was seen a couple weeks later on 07/25/23 at [facility] ED for COPD exacerbation. CXR showed clear lungs. Completed course of doxycycline and prednisone. This past week she has been feeling better much better. Maintained on Trelegy and Daliresp  .  Recent COPD exacerbations: January 19th, 2021-doxycycline, prednisone taper Mar 15, 2020- Z-Pak April 18, 2020- Z-Pak, prednisone taper May 17, 2020- Doxycycline, prednisone taper  03/14/2021 - Increased Trelegy x 2 weeks/ holding off on oral steriods and no indication for abx at this time  05/01/21- Called in with sob/cough and given zpack and prednisone  07/2021- Hospitalized overnight for COPD exacerbation 12/19/2021- Doxy/prednisone taper  01/11/2022- Pred taper, sample Trelegy (COPD exacerbation from Feb never completely resolved) 01/19/2022- No improvement from 3/10, sent in doxycycline  10/15/22- azithromycin and prednisone  March 2024- prednisone and doxycycline  June - COPD exacerbation Augmentin and prednisone  August [facility] hospital 8/17-20th  September 12th 2024- Ed visit    TEST/EVENTS :  Cardiac: > 08/23/2017-echocardiogram-LV ejection fraction 60 to 65% > 06/01/20- echocardiogram- LV ejection fraction 60-65%, mild concentric left ventricular hypertrophy, normal diastolic parameters    Pulmonary function testing: > 11/16/2013 PFTs- FVC 1.58 (67% predicted), postbronchodilator ratio 60, postbronchodilator FEV1 1.00 (53% predicted), no significant bronchodilator response, mid flow reversibility after administration of bronchodilator, DLCO 39 > 01/12/19 PFTs-FVC 1.18 (52%), FEV1 0.66 (37%), ratio 56, borderline bronchodilator response, DLCO correlated 6.07 (34%)   Imaging: > October 2020 CT chest - Neg for PE, +emphysema with no acute process. > 05/11/20 CTA- No PE, Chronic emphysematous changes in the upper lobes. Chronic linear scarring at the lung bases, left more than right. No effusions or acute infiltrates.        Allergies  Allergen Reactions   Acetaminophen Other (See Comments)    Pt was told that she could not take Tylenol, does not know why.    Immunization History  Administered Date(s) Administered   Influenza Split 09/20/2011,  09/12/2012, 08/12/2013   Influenza, Seasonal, Injecte, Preservative Fre 08/07/2023   Influenza,inj,Quad PF,6+ Mos 08/03/2014, 08/12/2015, 08/12/2017, 07/29/2018, 09/11/2021   Influenza,inj,Quad PF,6-35 Mos 09/11/2019   PFIZER(Purple Top)SARS-COV-2 Vaccination 02/05/2020, 02/25/2020, 11/08/2020   Pneumococcal Conjugate-13 09/24/2017   Pneumococcal Polysaccharide-23 11/12/2009    Past Medical History:  Diagnosis Date   Anemia    chronis   Anxiety    Arthritis    Cervical cancer (HCC) 1990   cervical    Chronic diastolic CHF (congestive heart failure) (HCC) 08/10/2016   COPD (chronic obstructive pulmonary disease) (HCC)    O2 dependent. Pulmo: Dr. Delton Coombes   Diabetes mellitus without complication (HCC)    a. A1c 8.3 in 11/2015   Emphysema    Gallstones    s/p cholecystectomy   GERD (gastroesophageal reflux disease)    Gout    History of home oxygen therapy    "2.5L; 24/7" (08/10/2016)   Hx of cardiac catheterization    a. LHC at [facility] in [place], [place] 09/2008:  Normal coronary arteries EF 70%. b. LHC 11/2016: normal cors, normal  LVEDP, EF 55-65%.   Hyperlipidemia    Hypertension    Hypoxia 11/17/2018   Morbid obesity (HCC)    OSA on CPAP    CPAP at night    Pneumonia    Sickle cell trait (HCC)    Tobacco abuse    a. up to 3ppd from age 32 to 51, now 1/4 ppd (01/2013) >> Quit 10/2015    Tobacco History: Social History   Tobacco Use  Smoking Status Former   Current packs/day: 0.00   Average packs/day: 3.0 packs/day for 36.0 years (108.0 ttl pk-yrs)   Types: Cigarettes   Start date: 11/11/1979   Quit date: 11/11/2015   Years since quitting: 7.7  Smokeless Tobacco Never  Tobacco Comments   Approx 90 pk-yrs (up to 3ppd until ~ 2009).   Counseling given: Not Answered Tobacco comments: Approx 90 pk-yrs (up to 3ppd until ~ 2009).   Outpatient Medications Prior to Visit  Medication Sig Dispense Refill   albuterol (PROVENTIL) (2.5 MG/3ML) 0.083% nebulizer solution USE 1 VIAL  VIA NEBULIZER 4 TIMES DAILY AS NEEDED. 300 mL 11   albuterol (VENTOLIN HFA) 108 (90 Base) MCG/ACT inhaler Inhale 2 puffs into the lungs every 4 (four) hours as needed for wheezing or shortness of breath. 18 g 3   allopurinol (ZYLOPRIM) 100 MG tablet Take 100 mg by mouth daily.     ALPRAZolam (XANAX) 1 MG tablet Take 1 mg by mouth at bedtime as needed for sleep.   0   aspirin EC 81 MG tablet Take 81 mg by mouth daily.     azelastine (ASTELIN) 0.1 % nasal spray Place 2 sprays into both nostrils 2 (two) times daily. Use in each nostril as directed 30 mL 12   benzonatate (TESSALON) 100 MG capsule Take 2 capsules (200 mg total) by mouth 3 (three) times daily as needed for cough. 60 capsule 1   cetirizine (ZYRTEC) 10 MG tablet Take 10 mg by mouth daily.     Cholecalciferol (VITAMIN D PO) Take 1 tablet by mouth daily.     clobetasol cream (TEMOVATE) 0.05 % Apply 1 application topically daily as needed (irritation from pull ups).   0   famotidine (PEPCID) 20 MG tablet Take 1 tablet (20 mg total) by mouth at bedtime. 30 tablet 0   fluticasone (FLONASE) 50 MCG/ACT nasal spray Place 1 spray into both nostrils daily as needed (congestion). 16 g 6   Fluticasone-Umeclidin-Vilant (TRELEGY ELLIPTA) 200-62.5-25 MCG/ACT AEPB Inhale 1 puff into the lungs daily.     furosemide (LASIX) 40 MG tablet Take 40 mg (1 tablet) by mouth in the morning and 20 mg (1/2 tablet) by mouth in the evening 135 tablet 3   gabapentin (NEURONTIN) 300 MG capsule Take 300 mg by mouth 3 (three) times daily.     guaiFENesin (MUCINEX) 600 MG 12 hr tablet Take 1 tablet (600 mg total) by mouth 2 (two) times daily as needed for to loosen phlegm. 30 tablet 1   HYDROcodone-acetaminophen (NORCO) 7.5-325 MG tablet Take 1 tablet by mouth every 6 (six) hours as needed for moderate pain.     linaclotide (LINZESS) 145 MCG CAPS capsule Take 145 mcg by mouth daily.     meclizine (ANTIVERT) 25 MG tablet Take 25 mg by mouth every 6 (six) hours as needed.      metoprolol succinate (TOPROL-XL) 50 MG 24 hr tablet TAKE ONE TABLET BY MOUTH EVERY MORNING 90 tablet 1   OXYGEN Inhale 3 L into the lungs continuous.  pantoprazole (PROTONIX) 40 MG tablet Take 1 tablet (40 mg total) by mouth 2 (two) times daily. 180 tablet 3   potassium chloride SA (KLOR-CON M) 20 MEQ tablet TAKE ONE TABLET BY MOUTH EVERY MORNING 90 tablet 1   Respiratory Therapy Supplies (FLUTTER) DEVI 1 Device by Does not apply route 3 (three) times daily. 1 each 0   roflumilast (DALIRESP) 500 MCG TABS tablet TAKE ONE TABLET BY MOUTH EVERY MORNING 30 tablet 6   spironolactone (ALDACTONE) 25 MG tablet Take 0.5 tablets (12.5 mg total) by mouth daily. 15 tablet 12   TRELEGY ELLIPTA 200-62.5-25 MCG/ACT AEPB INHALE 1 PUFF BY MOUTH EVERY DAY 60 each 5   zolpidem (AMBIEN) 10 MG tablet Take 10 mg by mouth at bedtime.     azithromycin (ZITHROMAX) 250 MG tablet Take 1 tablet (250 mg total) by mouth daily. Take first 2 tablets together, then 1 every day until finished. (Patient not taking: Reported on 08/07/2023) 6 tablet 0   doxycycline (VIBRA-TABS) 100 MG tablet Take 1 tablet (100 mg total) by mouth 2 (two) times daily. (Patient not taking: Reported on 08/07/2023) 14 tablet 0   No facility-administered medications prior to visit.    Review of Systems  Review of Systems  Constitutional: Negative.   Respiratory: Negative.      Physical Exam  BP (!) 130/56 (BP Location: Left Arm, Patient Position: Sitting, Cuff Size: Normal)   Pulse 96   Temp 98.7 F (37.1 C) (Oral)   Ht 5' (1.524 m)   Wt 186 lb (84.4 kg)   LMP 06/04/2000 (LMP Unknown) Comment: tubal ligation  SpO2 96%   BMI 36.33 kg/m  Physical Exam Constitutional:      Appearance: Normal appearance. She is obese.  HENT:     Head: Normocephalic and atraumatic.  Cardiovascular:     Rate and Rhythm: Normal rate and regular rhythm.  Pulmonary:     Effort: Pulmonary effort is normal.     Breath sounds: Normal breath sounds. No  wheezing, rhonchi or rales.     Comments: CTA, somewhat diminished ; 3L supplemental oxygen  Skin:    General: Skin is warm and dry.  Neurological:     General: No focal deficit present.     Mental Status: She is alert and oriented to person, place, and time. Mental status is at baseline.  Psychiatric:        Mood and Affect: Mood normal.        Behavior: Behavior normal.        Thought Content: Thought content normal.        Judgment: Judgment normal.      Lab Results:  CBC    Component Value Date/Time   WBC 8.0 07/25/2023 1830   RBC 4.17 07/25/2023 1830   HGB 11.6 (L) 07/25/2023 1830   HGB 12.3 05/10/2020 1004   HCT 35.4 (L) 07/25/2023 1830   HCT 36.9 05/10/2020 1004   PLT 281 07/25/2023 1830   PLT 193 05/10/2020 1004   MCV 84.9 07/25/2023 1830   MCV 86 05/10/2020 1004   MCH 27.8 07/25/2023 1830   MCHC 32.8 07/25/2023 1830   RDW 13.4 07/25/2023 1830   RDW 15.6 (H) 05/10/2020 1004   LYMPHSABS 2.3 07/25/2023 1830   MONOABS 0.6 07/25/2023 1830   EOSABS 0.0 07/25/2023 1830   BASOSABS 0.1 07/25/2023 1830    BMET    Component Value Date/Time   NA 142 07/25/2023 1830   NA 145 (H) 12/19/2022 1126  K 3.5 07/25/2023 1830   CL 98 07/25/2023 1830   CO2 36 (H) 07/25/2023 1830   GLUCOSE 97 07/25/2023 1830   BUN 13 07/25/2023 1830   BUN 10 12/19/2022 1126   CREATININE 1.03 (H) 07/25/2023 1830   CALCIUM 9.5 07/25/2023 1830   GFRNONAA >60 07/25/2023 1830   GFRAA 69 05/10/2020 1004    BNP    Component Value Date/Time   BNP 10.8 07/25/2023 1830    ProBNP    Component Value Date/Time   PROBNP <36 06/19/2022 1002   PROBNP 11.0 03/15/2022 0941    Imaging: DG Chest 2 View  Result Date: 07/25/2023 CLINICAL DATA:  Shortness of breath EXAM: CHEST - 2 VIEW COMPARISON:  Chest x-ray 01/11/2022.  CT of the chest 01/23/2023 FINDINGS: The heart size and mediastinal contours are within normal limits. Both lungs are clear. There are healed left-sided rib fractures. No acute  fractures are seen. IMPRESSION: 1. No active cardiopulmonary disease. 2. Healed left-sided rib fractures. Electronically Signed   By: Darliss Cheney M.D.   On: 07/25/2023 20:14     Assessment & Plan:   Chronic respiratory failure with hypoxia (HCC) - Stable; Continue 3L supplemental oxygen  COPD (chronic obstructive pulmonary disease) (HCC) - Recurrent exacerbations, last seen in ED on 07/25/23. She is feeling better. Symptoms are not acutely exacerbated but due to recurrent flare ups of chronic bronchitis recommend trial Breztri Aerosphere two puffs twice daily and add daily azithromycin. Continue Daliresp daily. Ok to proceed with hysteroscopy D&C with Dr. Mindi Slicker with [facility]  Hx of sleep apnea - In lab sleep study 06/04/23 was negative for obstructive sleep apnea, AHI 0.0/hour    Recommendations  - Hold Trelegy while using sample new inhaler  - Trial Breztri two puffs morning and evening (rinse mouth after use) - Start azithromycin Monday, Wednesday, Friday (to prevent exacerbations)  - Please take over the counter Zyrtec-D for 5-7 days (monitor BP, if greater than 140/90 consistently please stop medication)   - Continue Daliresp 500 mcg daily - Continue Astelin nasal spray - Continue to use flutter valve/start using incentive spirometer 2-3 times a day to encourage deep breathing - Ok to receive flu shot today    Follow-up: - 4-6 weeks with Dr. Delton Coombes or Waynetta Sandy NP (needs EKG)   Glenford Bayley, NP 08/11/2023

## 2023-08-13 NOTE — Telephone Encounter (Signed)
Note from 08/07/23 was faxed to Dr Shella Spearing office [phone]

## 2023-08-22 NOTE — Progress Notes (Signed)
Cardiology Office Note    Patient Name: Stephanie Schultz Date of Encounter: 08/22/2023  Primary Care Provider:  Rometta Emery, MD Primary Cardiologist:  Armanda Magic, MD Primary Electrophysiologist: None   Past Medical History    Past Medical History:  Diagnosis Date   Anemia    chronis   Anxiety    Arthritis    Cervical cancer (HCC) 1990   cervical    Chronic diastolic CHF (congestive heart failure) (HCC) 08/10/2016   COPD (chronic obstructive pulmonary disease) (HCC)    O2 dependent. Pulmo: Dr. Delton Coombes   Diabetes mellitus without complication (HCC)    a. A1c 8.3 in 11/2015   Emphysema    Gallstones    s/p cholecystectomy   GERD (gastroesophageal reflux disease)    Gout    History of home oxygen therapy    "2.5L; 24/7" (08/10/2016)   Hx of cardiac catheterization    a. LHC at [facility] in [place], [place] 09/2008:  Normal coronary arteries EF 70%. b. LHC 11/2016: normal cors, normal LVEDP, EF 55-65%.   Hyperlipidemia    Hypertension    Hypoxia 11/17/2018   Morbid obesity (HCC)    OSA on CPAP    CPAP at night    Pneumonia    Sickle cell trait (HCC)    Tobacco abuse    a. up to 3ppd from age 3 to 20, now 1/4 ppd (01/2013) >> Quit 10/2015    History of Present Illness  Stephanie Schultz is a 67 y.o. female with a PMH of HTN, HLD, COPD, DM type II, HFpEF, OSA (on CPAP), chronic respiratory failure who presents today for 13-month follow-up.  Stephanie Schultz was seen initially in 2017 for follow-up of chest pain and shortness of breath.  She had previously been admitted 03/2016 with exacerbation of COPD.  She was seen in follow-up with complaint of chest pain patient was started on low-dose nitrates.  An LHC on 12/03/2016 that showed normal coronaries.  She was last seen by Dr. Mayford Knife on 07/2021 and was most recently seen by Jari Favre, PA on 12/19/2022.  During visit patient reported a bad smell to her urine with burning and discomfort.  She was otherwise euvolemic on exam and blood  pressures were well-controlled.  She has suffered multiple exacerbations of COPD with most recent being in 06/2023 at [facility].  She required a combination of DuoNebs and prednisone as well as doxycycline.  2D echo was completed 07/2022 showing EF of 60 to 65% with no RWMA and trivial MVR.  During today's visit the patient reports that she has been doing well and denies any new cardiac complaints such as chest pain or palpitations.  She is currently on 3 L nasal cannula and was seen recently at [facility] for exacerbation of COPD and was treated with nebs and discharged in stable condition.  She recently completed a sleep study and requires CPAP.  She sleeps on 2 pillows with a neck pillow at night.  She is scheduled to undergo a D&C procedure on 09/19/2023.  Patient denies chest pain, palpitations, dyspnea, PND, orthopnea, nausea, vomiting, dizziness, syncope, edema, weight gain, or early satiety.  Review of Systems  Please see the history of present illness.    All other systems reviewed and are otherwise negative except as noted above.  Physical Exam    Wt Readings from Last 3 Encounters:  08/07/23 186 lb (84.4 kg)  07/25/23 190 lb (86.2 kg)  06/04/23 186 lb (84.4 kg)  XB:JYNWG were no vitals filed for this visit.,There is no height or weight on file to calculate BMI. GEN: Well nourished, well developed in no acute distress Neck: No JVD; No carotid bruits Pulmonary: Clear to auscultation and diminished in the bases Cardiovascular: Normal rate. Regular rhythm. Normal S1. Normal S2.   Murmurs: There is no murmur.  ABDOMEN: Soft, non-tender, non-distended EXTREMITIES:  No edema; No deformity   EKG/LABS/ Recent Cardiac Studies   ECG personally reviewed by me today -none completed today  Risk Assessment/Calculations:          Lab Results  Component Value Date   WBC 8.0 07/25/2023   HGB 11.6 (L) 07/25/2023   HCT 35.4 (L) 07/25/2023   MCV 84.9 07/25/2023   PLT 281 07/25/2023    Lab Results  Component Value Date   CREATININE 1.03 (H) 07/25/2023   BUN 13 07/25/2023   NA 142 07/25/2023   K 3.5 07/25/2023   CL 98 07/25/2023   CO2 36 (H) 07/25/2023   Lab Results  Component Value Date   CHOL 119 12/01/2016   HDL 34 (L) 12/01/2016   LDLCALC 43 12/01/2016   TRIG 212 (H) 12/01/2016   CHOLHDL 3.5 12/01/2016    Lab Results  Component Value Date   HGBA1C 7.9 (H) 11/18/2018   Assessment & Plan    1.  Preoperative clearance: -Patient's RCRI score is 6.6% -The patient affirms she has been doing well without any new cardiac symptoms. They are able to achieve 6 METS without cardiac limitations. Therefore, based on ACC/AHA guidelines, the patient would be at acceptable risk for the planned procedure without further cardiovascular testing. The patient was advised that if she develops new symptoms prior to surgery to contact our office to arrange for a follow-up visit, and she verbalized understanding.   2.  HFpEF: -2D echo last completed 07/2022 with EF of 60 to 65% -Today patient appears euvolemic however body habitus makes it difficult to ascertain. -She has shortness of breath at baseline due to her history of COPD. -She recently had BNP completed in September that was normal -Continue spironolactone 12.5 mg daily and Toprol-XL 50 mg daily -Low sodium diet, fluid restriction <2L, and daily weights encouraged. Educated to contact our office for weight gain of 2 lbs overnight or 5 lbs in one week.   3.  COPD: -Patient last admitted 06/2023 with exacerbation of COPD -Today patient is reporting no increased episodes of shortness of breath and is tolerating her current medications without any adverse reactions. -She is being followed by pulmonology.  4.  Essential hypertension: -Patient's blood pressure today was controlled at 130/70 -Continue spironolactone 12.5 mg daily and Toprol-XL 50 mg daily  5.  Obesity: -Patient's BMI today was 37.46 -Patient encouraged  to reduce calorie intake and increase physical activity as tolerated.  Disposition: Follow-up with Armanda Magic, MD or APP in 6 months    Signed, Napoleon Form, Leodis Rains, NP 08/22/2023, 6:21 PM [facility]

## 2023-08-23 ENCOUNTER — Ambulatory Visit: Payer: 59 | Attending: Nurse Practitioner | Admitting: Nurse Practitioner

## 2023-08-23 ENCOUNTER — Encounter: Payer: Self-pay | Admitting: Nurse Practitioner

## 2023-08-23 VITALS — BP 130/70 | Resp 16 | Ht 60.0 in | Wt 191.8 lb

## 2023-08-23 DIAGNOSIS — Z01810 Encounter for preprocedural cardiovascular examination: Secondary | ICD-10-CM

## 2023-08-23 DIAGNOSIS — I5032 Chronic diastolic (congestive) heart failure: Secondary | ICD-10-CM

## 2023-08-23 DIAGNOSIS — J449 Chronic obstructive pulmonary disease, unspecified: Secondary | ICD-10-CM

## 2023-08-23 DIAGNOSIS — I10 Essential (primary) hypertension: Secondary | ICD-10-CM

## 2023-08-23 NOTE — Patient Instructions (Signed)
Medication Instructions:  Your physician recommends that you continue on your current medications as directed. Please refer to the Current Medication list given to you today.  *If you need a refill on your cardiac medications before your next appointment, please call your pharmacy*   Lab Work: None ordered  If you have labs (blood work) drawn today and your tests are completely normal, you will receive your results only by: MyChart Message (if you have MyChart) OR A paper copy in the mail If you have any lab test that is abnormal or we need to change your treatment, we will call you to review the results.   Testing/Procedures: None ordered   Follow-Up: At [facility], you and your health needs are our priority.  As part of our continuing mission to provide you with exceptional heart care, we have created designated Provider Care Teams.  These Care Teams include your primary Cardiologist (physician) and Advanced Practice Providers (APPs -  Physician Assistants and Nurse Practitioners) who all work together to provide you with the care you need, when you need it.  We recommend signing up for the patient portal called "MyChart".  Sign up information is provided on this After Visit Summary.  MyChart is used to connect with patients for Virtual Visits (Telemedicine).  Patients are able to view lab/test results, encounter notes, upcoming appointments, etc.  Non-urgent messages can be sent to your provider as well.   To learn more about what you can do with MyChart, go to [email or web address].    Your next appointment:   6 month(s)  Provider:   Armanda Magic, MD     Other Instructions Low-Sodium Eating Plan Salt (sodium) helps you keep a healthy balance of fluids in your body. Too much sodium can raise your blood pressure. It can also cause fluid and waste to be held in your body. Your health care provider or dietitian may recommend a low-sodium eating plan if you have high  blood pressure (hypertension), kidney disease, liver disease, or heart failure. Eating less sodium can help lower your blood pressure and reduce swelling. It can also protect your heart, liver, and kidneys. What are tips for following this plan? Reading food labels  Check food labels for the amount of sodium per serving. If you eat more than one serving, you must multiply the listed amount by the number of servings. Choose foods with less than 140 milligrams (mg) of sodium per serving. Avoid foods with 300 mg of sodium or more per serving. Always check how much sodium is in a product, even if the label says "unsalted" or "no salt added." Shopping  Buy products labeled as "low-sodium" or "no salt added." Buy fresh foods. Avoid canned foods and pre-made or frozen meals. Avoid canned, cured, or processed meats. Buy breads that have less than 80 mg of sodium per slice. Cooking  Eat more home-cooked food. Try to eat less restaurant, buffet, and fast food. Try not to add salt when you cook. Use salt-free seasonings or herbs instead of table salt or sea salt. Check with your provider or pharmacist before using salt substitutes. Cook with plant-based oils, such as canola, sunflower, or olive oil. Meal planning When eating at a restaurant, ask if your food can be made with less salt or no salt. Avoid dishes labeled as brined, pickled, cured, or smoked. Avoid dishes made with soy sauce, miso, or teriyaki sauce. Avoid foods that have monosodium glutamate (MSG) in them. MSG may be added to some restaurant  food, sauces, soups, bouillon, and canned foods. Make meals that can be grilled, baked, poached, roasted, or steamed. These are often made with less sodium. General information Try to limit your sodium intake to 1,500-2,300 mg each day, or the amount told by your provider. What foods should I eat? Fruits Fresh, frozen, or canned fruit. Fruit juice. Vegetables Fresh or frozen vegetables. "No salt  added" canned vegetables. "No salt added" tomato sauce and paste. Low-sodium or reduced-sodium tomato and vegetable juice. Grains Low-sodium cereals, such as oats, puffed wheat and rice, and shredded wheat. Low-sodium crackers. Unsalted rice. Unsalted pasta. Low-sodium bread. Whole grain breads and whole grain pasta. Meats and other proteins Fresh or frozen meat, poultry, seafood, and fish. These should have no added salt. Low-sodium canned tuna and salmon. Unsalted nuts. Dried peas, beans, and lentils without added salt. Unsalted canned beans. Eggs. Unsalted nut butters. Dairy Milk. Soy milk. Cheese that is naturally low in sodium, such as ricotta cheese, fresh mozzarella, or Swiss cheese. Low-sodium or reduced-sodium cheese. Cream cheese. Yogurt. Seasonings and condiments Fresh and dried herbs and spices. Salt-free seasonings. Low-sodium mustard and ketchup. Sodium-free salad dressing. Sodium-free light mayonnaise. Fresh or refrigerated horseradish. Lemon juice. Vinegar. Other foods Homemade, reduced-sodium, or low-sodium soups. Unsalted popcorn and pretzels. Low-salt or salt-free chips. The items listed above may not be all the foods and drinks you can have. Talk to a dietitian to learn more. What foods should I avoid? Vegetables Sauerkraut, pickled vegetables, and relishes. Olives. [place] fries. Onion rings. Regular canned vegetables, except low-sodium or reduced-sodium items. Regular canned tomato sauce and paste. Regular tomato and vegetable juice. Frozen vegetables in sauces. Grains Instant hot cereals. Bread stuffing, pancake, and biscuit mixes. Croutons. Seasoned rice or pasta mixes. Noodle soup cups. Boxed or frozen macaroni and cheese. Regular salted crackers. Self-rising flour. Meats and other proteins Meat or fish that is salted, canned, smoked, spiced, or pickled. Precooked or cured meat, such as sausages or meat loaves. Tomasa Blase. Ham. Pepperoni. Hot dogs. Corned beef. Chipped beef. Salt  pork. Jerky. Pickled herring, anchovies, and sardines. Regular canned tuna. Salted nuts. Dairy Processed cheese and cheese spreads. Hard cheeses. Cheese curds. Blue cheese. Feta cheese. String cheese. Regular cottage cheese. Buttermilk. Canned milk. Fats and oils Salted butter. Regular margarine. Ghee. Bacon fat. Seasonings and condiments Onion salt, garlic salt, seasoned salt, table salt, and sea salt. Canned and packaged gravies. Worcestershire sauce. Tartar sauce. Barbecue sauce. Teriyaki sauce. Soy sauce, including reduced-sodium soy sauce. Steak sauce. Fish sauce. Oyster sauce. Cocktail sauce. Horseradish that you find on the shelf. Regular ketchup and mustard. Meat flavorings and tenderizers. Bouillon cubes. Hot sauce. Pre-made or packaged marinades. Pre-made or packaged taco seasonings. Relishes. Regular salad dressings. Salsa. Other foods Salted popcorn and pretzels. Corn chips and puffs. Potato and tortilla chips. Canned or dried soups. Pizza. Frozen entrees and pot pies. The items listed above may not be all the foods and drinks you should avoid. Talk to a dietitian to learn more. This information is not intended to replace advice given to you by your health care provider. Make sure you discuss any questions you have with your health care provider. Document Revised: 11/15/2022 Document Reviewed: 11/15/2022 Elsevier Patient Education  2024 Else[employer] Inc.    Heart-Healthy Eating Plan Eating a healthy diet is important for the health of your heart. A heart-healthy eating plan includes: Eating less unhealthy fats. Eating more healthy fats. Eating less salt in your food. Salt is also called sodium. Making other changes in your  diet. Talk with your doctor or a diet specialist (dietitian) to create an eating plan that is right for you. What is my plan? Your doctor may recommend an eating plan that includes: Total fat: ______% or less of total calories a day. Saturated fat: ______% or less  of total calories a day. Cholesterol: less than _________mg a day. Sodium: less than _________mg a day. What are tips for following this plan? Cooking Avoid frying your food. Try to bake, boil, grill, or broil it instead. You can also reduce fat by: Removing the skin from poultry. Removing all visible fats from meats. Steaming vegetables in water or broth. Meal planning  At meals, divide your plate into four equal parts: Fill one-half of your plate with vegetables and green salads. Fill one-fourth of your plate with whole grains. Fill one-fourth of your plate with lean protein foods. Eat 2-4 cups of vegetables per day. One cup of vegetables is: 1 cup (91 g) broccoli or cauliflower florets. 2 medium carrots. 1 large bell pepper. 1 large sweet potato. 1 large tomato. 1 medium white potato. 2 cups (150 g) raw leafy greens. Eat 1-2 cups of fruit per day. One cup of fruit is: 1 small apple 1 large banana 1 cup (237 g) mixed fruit, 1 large orange,  cup (82 g) dried fruit, 1 cup (240 mL) 100% fruit juice. Eat more foods that have soluble fiber. These are apples, broccoli, carrots, beans, peas, and barley. Try to get 20-30 g of fiber per day. Eat 4-5 servings of nuts, legumes, and seeds per week: 1 serving of dried beans or legumes equals  cup (90 g) cooked. 1 serving of nuts is  oz (12 almonds, 24 pistachios, or 7 walnut halves). 1 serving of seeds equals  oz (8 g). General information Eat more home-cooked food. Eat less restaurant, buffet, and fast food. Limit or avoid alcohol. Limit foods that are high in starch and sugar. Avoid fried foods. Lose weight if you are overweight. Keep track of how much salt (sodium) you eat. This is important if you have high blood pressure. Ask your doctor to tell you more about this. Try to add vegetarian meals each week. Fats Choose healthy fats. These include olive oil and canola oil, flaxseeds, walnuts, almonds, and seeds. Eat more  omega-3 fats. These include salmon, mackerel, sardines, tuna, flaxseed oil, and ground flaxseeds. Try to eat fish at least 2 times each week. Check food labels. Avoid foods with trans fats or high amounts of saturated fat. Limit saturated fats. These are often found in animal products, such as meats, butter, and cream. These are also found in plant foods, such as palm oil, palm kernel oil, and coconut oil. Avoid foods with partially hydrogenated oils in them. These have trans fats. Examples are stick margarine, some tub margarines, cookies, crackers, and other baked goods. What foods should I eat? Fruits All fresh, canned (in natural juice), or frozen fruits. Vegetables Fresh or frozen vegetables (raw, steamed, roasted, or grilled). Green salads. Grains Most grains. Choose whole wheat and whole grains most of the time. Rice and pasta, including brown rice and pastas made with whole wheat. Meats and other proteins Lean, well-trimmed beef, veal, pork, and lamb. Chicken and [place] without skin. All fish and shellfish. Wild duck, rabbit, pheasant, and venison. Egg whites or low-cholesterol egg substitutes. Dried beans, peas, lentils, and tofu. Seeds and most nuts. Dairy Low-fat or nonfat cheeses, including ricotta and mozzarella. Skim or 1% milk that is liquid, powdered,  or evaporated. Buttermilk that is made with low-fat milk. Nonfat or low-fat yogurt. Fats and oils Non-hydrogenated (trans-free) margarines. Vegetable oils, including soybean, sesame, sunflower, olive, peanut, safflower, corn, canola, and cottonseed. Salad dressings or mayonnaise made with a vegetable oil. Beverages Mineral water. Coffee and tea. Diet carbonated beverages. Sweets and desserts Sherbet, gelatin, and fruit ice. Small amounts of dark chocolate. Limit all sweets and desserts. Seasonings and condiments All seasonings and condiments. The items listed above may not be a complete list of foods and drinks you can eat.  Contact a dietitian for more options. What foods should I avoid? Fruits Canned fruit in heavy syrup. Fruit in cream or butter sauce. Fried fruit. Limit coconut. Vegetables Vegetables cooked in cheese, cream, or butter sauce. Fried vegetables. Grains Breads that are made with saturated or trans fats, oils, or whole milk. Croissants. Sweet rolls. Donuts. High-fat crackers, such as cheese crackers. Meats and other proteins Fatty meats, such as hot dogs, ribs, sausage, bacon, rib-eye roast or steak. High-fat deli meats, such as salami and bologna. Caviar. Domestic duck and goose. Organ meats, such as liver. Dairy Cream, sour cream, cream cheese, and creamed cottage cheese. Whole-milk cheeses. Whole or 2% milk that is liquid, evaporated, or condensed. Whole buttermilk. Cream sauce or high-fat cheese sauce. Yogurt that is made from whole milk. Fats and oils Meat fat, or shortening. Cocoa butter, hydrogenated oils, palm oil, coconut oil, palm kernel oil. Solid fats and shortenings, including bacon fat, salt pork, lard, and butter. Nondairy cream substitutes. Salad dressings with cheese or sour cream. Beverages Regular sodas and juice drinks with added sugar. Sweets and desserts Frosting. Pudding. Cookies. Cakes. Pies. Milk chocolate or white chocolate. Buttered syrups. Full-fat ice cream or ice cream drinks. The items listed above may not be a complete list of foods and drinks to avoid. Contact a dietitian for more information. Summary Heart-healthy meal planning includes eating less unhealthy fats, eating more healthy fats, and making other changes in your diet. Eat a balanced diet. This includes fruits and vegetables, low-fat or nonfat dairy, lean protein, nuts and legumes, whole grains, and heart-healthy oils and fats. This information is not intended to replace advice given to you by your health care provider. Make sure you discuss any questions you have with your health care provider. Document  Revised: 12/04/2021 Document Reviewed: 12/04/2021 Elsevier Patient Education  2024 [employer].

## 2023-09-04 ENCOUNTER — Encounter (HOSPITAL_BASED_OUTPATIENT_CLINIC_OR_DEPARTMENT_OTHER): Payer: Self-pay | Admitting: Obstetrics and Gynecology

## 2023-09-04 NOTE — Progress Notes (Signed)
Review pt for pre-op interview.  Noted pt is dependent on supplemental oxygen.  Pt not candidate for [facility] per anesthesia guidelines for ambulatory surgery center's.  Called and spoke w/ Dahlia Client, [place] scheduler Dr Mindi Slicker, informed of this , stated when their computer comes back up she will take care moving pt.

## 2023-09-13 ENCOUNTER — Encounter (HOSPITAL_COMMUNITY): Payer: Self-pay | Admitting: Obstetrics and Gynecology

## 2023-09-13 ENCOUNTER — Other Ambulatory Visit: Payer: Self-pay

## 2023-09-13 NOTE — Progress Notes (Signed)
PCP - Earlie Lou, MD Cardiologist - Armanda Magic, MD  EKG - 07/24/2023 Chest x-ray - 07/25/2023 ECHO - 07/27/2022 Cardiac Cath - 12/03/2016  Sleep Study 06/05/2023 CPAP - No  DM- Type II Fasting Blood Sugar:  80's Checks Blood Sugar:  3/week  Blood Thinner Instructions: Denies Aspirin Instructions: Ask MD for further instructions.  ERAS Protcol - N/A COVID TEST- N/A  Anesthesia review: Yes, extensive cardiac hx, COPD Oxygen dependent and requires nebulizing treatment 3 times a day.   -------------  SDW INSTRUCTIONS:  Your procedure is scheduled on Monday Nov 4th. Please report to [facility] Main Entrance "A" at 0530 A.M., and check in at the Admitting office. Call this number if you have problems the morning of surgery: [phone]   Remember: Do not eat or drink after midnight the night before your surgery    Medications to take morning of surgery with a sip of water include: allopurinol (ZYLOPRIM)  azelastine (ASTELIN) 0.1 % nasal spray  azithromycin (ZITHROMAX)  cetirizine (ZYRTEC)  metoprolol succinate (TOPROL-XL)  pantoprazole (PROTONIX)  roflumilast (DALIRESP)  TRELEGY ELLIPTA 200-62.5-25 MCG/ACT AEPB   AS NEEDED acetaminophen (TYLENOL)  albuterol (PROVENTIL)  gabapentin (NEURONTIN)  ALPRAZolam (XANAX)  HYDROcodone-acetaminophen (NORCO)   HOLD & DAYS Semaglutide,0.25 or 0.5MG /DOS, (OZEMPIC)  As of today, STOP taking any Aspirin (unless otherwise instructed by your surgeon), Aleve, Naproxen, Ibuprofen, Motrin, Advil, Goody's, BC's, all herbal medications, fish oil, and all vitamins.  WHAT DO I DO ABOUT MY DIABETES MEDICATION?   Do not take oral diabetes medicines (pills) Glucotrol the morning of surgery.   The day of surgery, do not take other diabetes injectables, including Byetta (exenatide), Bydureon (exenatide ER), Victoza (liraglutide), or Trulicity (dulaglutide).  If your CBG is greater than 220 mg/dL, you may take  of your sliding scale  (correction) dose of insulin.   HOW TO MANAGE YOUR DIABETES BEFORE AND AFTER SURGERY  Why is it important to control my blood sugar before and after surgery? Improving blood sugar levels before and after surgery helps healing and can limit problems. A way of improving blood sugar control is eating a healthy diet by:  Eating less sugar and carbohydrates  Increasing activity/exercise  Talking with your doctor about reaching your blood sugar goals High blood sugars (greater than 180 mg/dL) can raise your risk of infections and slow your recovery, so you will need to focus on controlling your diabetes during the weeks before surgery. Make sure that the doctor who takes care of your diabetes knows about your planned surgery including the date and location.  How do I manage my blood sugar before surgery? Check your blood sugar at least 4 times a day, starting 2 days before surgery, to make sure that the level is not too high or low.  Check your blood sugar the morning of your surgery when you wake up and every 2 hours until you get to the Short Stay unit.  If your blood sugar is less than 70 mg/dL, you will need to treat for low blood sugar: Do not take insulin. Treat a low blood sugar (less than 70 mg/dL) with  cup of clear juice (cranberry or apple), 4 glucose tablets, OR glucose gel. Recheck blood sugar in 15 minutes after treatment (to make sure it is greater than 70 mg/dL). If your blood sugar is not greater than 70 mg/dL on recheck, call [phone] for further instructions. Report your blood sugar to the short stay nurse when you get to Short Stay.  If  you are admitted to the hospital after surgery: Your blood sugar will be checked by the staff and you will probably be given insulin after surgery (instead of oral diabetes medicines) to make sure you have good blood sugar levels. The goal for blood sugar control after surgery is 80-180 mg/dL.    The Morning of Surgery Do not wear  jewelry, make-up or nail polish. Do not wear lotions, powders, or perfumes/colognes, or deodorant Do not bring valuables to the hospital. [facility] is not responsible for any belongings or valuables.  If you are a smoker, DO NOT Smoke 24 hours prior to surgery  If you wear a CPAP at night please bring your mask the morning of surgery   Remember that you must have someone to transport you home after your surgery, and remain with you for 24 hours if you are discharged the same day.  Please bring cases for contacts, glasses, hearing aids, dentures or bridgework because it cannot be worn into surgery.   Patients discharged the day of surgery will not be allowed to drive home.   Please shower the NIGHT BEFORE/MORNING OF SURGERY (use antibacterial soap like DIAL soap if possible). Wear comfortable clothes the morning of surgery. Oral Hygiene is also important to reduce your risk of infection.  Remember - BRUSH YOUR TEETH THE MORNING OF SURGERY WITH YOUR REGULAR TOOTHPASTE  Patient denies shortness of breath, fever, cough and chest pain.

## 2023-09-13 NOTE — Anesthesia Preprocedure Evaluation (Addendum)
Anesthesia Evaluation  Patient identified by MRN, date of birth, ID band Patient awake    Reviewed: Allergy & Precautions, H&P , NPO status , Patient's Chart, lab work & pertinent test results  Airway Mallampati: IV  TM Distance: >3 FB Neck ROM: Full    Dental  (+) Edentulous Upper, Edentulous Lower   Pulmonary sleep apnea (does not use CPAP, had repeat sleep study in April) , COPD (3LPM [place] 24/7),  COPD inhaler and oxygen dependent, former smoker Used trellegy, neb and albuterol inhaler this morning   Pulmonary exam normal breath sounds clear to auscultation       Cardiovascular hypertension (131/67 preop), Pt. on medications Normal cardiovascular exam Rhythm:Regular Rate:Normal  Echo 2023 1. Left ventricular ejection fraction, by estimation, is 60 to 65%. The  left ventricle has normal function. The left ventricle has no regional  wall motion abnormalities. Left ventricular diastolic parameters were  normal.   2. Right ventricular systolic function is normal. The right ventricular  size is normal. Tricuspid regurgitation signal is inadequate for assessing  PA pressure.   3. The mitral valve is grossly normal. Trivial mitral valve  regurgitation. No evidence of mitral stenosis.   4. The aortic valve is tricuspid. There is mild calcification of the  aortic valve. Aortic valve regurgitation is not visualized. No aortic  stenosis is present.     Neuro/Psych  PSYCHIATRIC DISORDERS Anxiety     negative neurological ROS     GI/Hepatic Neg liver ROS,GERD  Medicated and Controlled,,  Endo/Other  diabetes, Well Controlled, Type 2, Oral Hypoglycemic Agents  Obesity BMI 36  Renal/GU negative Renal ROS  negative genitourinary   Musculoskeletal  (+) Arthritis , Osteoarthritis,    Abdominal  (+) + obese  Peds negative pediatric ROS (+)  Hematology negative hematology ROS (+)   Anesthesia Other Findings Ozempic LD 8d ago   Reproductive/Obstetrics negative OB ROS                             Anesthesia Physical Anesthesia Plan  ASA: 4  Anesthesia Plan: General   Post-op Pain Management: Tylenol PO (pre-op)*   Induction: Intravenous  PONV Risk Score and Plan: 3 and Ondansetron, Dexamethasone, Midazolam and Treatment may vary due to age or medical condition  Airway Management Planned: LMA  Additional Equipment: None  Intra-op Plan:   Post-operative Plan: Extubation in OR  Informed Consent: I have reviewed the patients History and Physical, chart, labs and discussed the procedure including the risks, benefits and alternatives for the proposed anesthesia with the patient or authorized representative who has indicated his/her understanding and acceptance.     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  Sleep study 05/2023 was negative for OSA.  She is maintained on Trelegy, Daliresp, as needed albuterol, and continues oxygen at 3 to 4 L/min.  Last seen by Ames Dura, NP on 08/07/2023.  At that time she was started on prophylactic azithromycin Monday Wednesday Friday to prevent exacerbations.  She was also advised to hold Trelegy and trial Breztri.  Upcoming surgery discussed.  Per note, "okay to proceed with hysteroscopy D&C with Dr. Mindi Slicker."  Follows with cardiology for history of HTN, HLD, HFpEF.  Cath 11/2016  showed normal coronary anatomy.  Echo 07/2022 showed EF 65%.  Seen by Robin Searing, NP on 08/23/2023 for preop evaluation.  Per note, "-Patient's RCRI score is 6.6% -The patient affirms she has been doing well without any new cardiac symptoms. They are able to achieve 6 METS  without cardiac limitations. Therefore, based on ACC/AHA guidelines, the patient would be at acceptable risk for the planned procedure without further cardiovascular testing. The patient was advised that if she develops new symptoms prior to surgery to contact our office to arrange for a follow-up visit, and she verbalized understanding."  Non-insulin-dependent DM2.  A1c 6.8 on 06/29/2023.  Patient reports last dose of Ozempic 09/08/2023.  She will need to have surgery labs and evaluation.  EKG 07/25/2023: Sinus rhythm.  Rate 79.  TTE 07/17/2022: 1. Left ventricular ejection fraction, by estimation, is 60 to 65%. The  left ventricle has normal function. The left ventricle has no regional  wall motion abnormalities. Left ventricular diastolic parameters were  normal.  2. Right ventricular systolic function is normal. The right ventricular  size is normal. Tricuspid regurgitation signal is inadequate for assessing  PA pressure.  3. The mitral valve is grossly normal. Trivial mitral valve  regurgitation. No evidence of mitral stenosis.  4. The aortic valve is tricuspid. There is mild calcification of the  aortic valve. Aortic valve regurgitation is not visualized. No aortic  stenosis is present.   Comparison(s): No significant change from prior study. 06/01/20 EF 60-65%.   Cath 12/03/2016:  The left ventricular systolic function is normal.  LV end diastolic pressure is normal.  The left ventricular ejection fraction is 55-65% by visual estimate.   1. Normal coronary anatomy 2. Normal LV function 3. Normal LVEDP  Plan: medical management.   )        Anesthesia Quick Evaluation

## 2023-09-13 NOTE — Progress Notes (Addendum)
Anesthesia Chart Review: Same day workup  67 year old female follows with pulmonology for history of previous smoker (108-pack-year history, quit in 2016), severe COPD with asthmatic features with frequent exacerbations, chronic cough, chronic respiratory failure O2 dependent.  Sleep study 05/2023 was negative for OSA.  She is maintained on Trelegy, Daliresp, as needed albuterol, and continues oxygen at 3 to 4 L/min.  Last seen by Ames Dura, NP on 08/07/2023.  At that time she was started on prophylactic azithromycin Monday Wednesday Friday to prevent exacerbations.  She was also advised to hold Trelegy and trial Breztri.  Upcoming surgery discussed.  Per note, "okay to proceed with hysteroscopy D&C with Dr. Mindi Slicker."  Follows with cardiology for history of HTN, HLD, HFpEF.  Cath 11/2016 showed normal coronary anatomy.  Echo 07/2022 showed EF 65%.  Seen by Robin Searing, NP on 08/23/2023 for preop evaluation.  Per note, "-Patient's RCRI score is 6.6% -The patient affirms she has been doing well without any new cardiac symptoms. They are able to achieve 6 METS without cardiac limitations. Therefore, based on ACC/AHA guidelines, the patient would be at acceptable risk for the planned procedure without further cardiovascular testing. The patient was advised that if she develops new symptoms prior to surgery to contact our office to arrange for a follow-up visit, and she verbalized understanding."  Non-insulin-dependent DM2.  A1c 6.8 on 06/29/2023.  Patient reports last dose of Ozempic 09/08/2023.  She will need to have surgery labs and evaluation.  EKG 07/25/2023: Sinus rhythm.  Rate 79.  TTE 07/17/2022:  1. Left ventricular ejection fraction, by estimation, is 60 to 65%. The  left ventricle has normal function. The left ventricle has no regional  wall motion abnormalities. Left ventricular diastolic parameters were  normal.   2. Right ventricular systolic function is normal. The right ventricular  size  is normal. Tricuspid regurgitation signal is inadequate for assessing  PA pressure.   3. The mitral valve is grossly normal. Trivial mitral valve  regurgitation. No evidence of mitral stenosis.   4. The aortic valve is tricuspid. There is mild calcification of the  aortic valve. Aortic valve regurgitation is not visualized. No aortic  stenosis is present.   Comparison(s): No significant change from prior study. 06/01/20 EF 60-65%.   Cath 12/03/2016: The left ventricular systolic function is normal. LV end diastolic pressure is normal. The left ventricular ejection fraction is 55-65% by visual estimate.   1. Normal coronary anatomy 2. Normal LV function 3. Normal LVEDP   Plan: medical management.    Zannie Cove [facility] Short Stay Center/Anesthesiology Phone [phone] 09/13/2023 12:10 PM

## 2023-09-16 ENCOUNTER — Ambulatory Visit (HOSPITAL_COMMUNITY): Payer: 59 | Admitting: Physician Assistant

## 2023-09-16 ENCOUNTER — Encounter (HOSPITAL_COMMUNITY)
Admission: RE | Disposition: A | Discharge status: Home / Self Care | Payer: Self-pay | Source: Home / Self Care | Attending: Obstetrics and Gynecology

## 2023-09-16 ENCOUNTER — Ambulatory Visit (HOSPITAL_COMMUNITY)
Admission: RE | Admit: 2023-09-16 | Discharge: 2023-09-16 | Disposition: A | Discharge status: Home / Self Care | Payer: 59 | Attending: Obstetrics and Gynecology | Admitting: Obstetrics and Gynecology

## 2023-09-16 ENCOUNTER — Telehealth: Payer: Self-pay | Admitting: Emergency Medicine

## 2023-09-16 ENCOUNTER — Other Ambulatory Visit (HOSPITAL_COMMUNITY): Payer: Self-pay

## 2023-09-16 ENCOUNTER — Other Ambulatory Visit: Payer: Self-pay

## 2023-09-16 ENCOUNTER — Ambulatory Visit (HOSPITAL_BASED_OUTPATIENT_CLINIC_OR_DEPARTMENT_OTHER): Payer: 59 | Admitting: Physician Assistant

## 2023-09-16 DIAGNOSIS — G4730 Sleep apnea, unspecified: Secondary | ICD-10-CM | POA: Diagnosis not present

## 2023-09-16 DIAGNOSIS — Z7984 Long term (current) use of oral hypoglycemic drugs: Secondary | ICD-10-CM | POA: Diagnosis not present

## 2023-09-16 DIAGNOSIS — R9389 Abnormal findings on diagnostic imaging of other specified body structures: Secondary | ICD-10-CM | POA: Diagnosis not present

## 2023-09-16 DIAGNOSIS — J449 Chronic obstructive pulmonary disease, unspecified: Secondary | ICD-10-CM | POA: Insufficient documentation

## 2023-09-16 DIAGNOSIS — E669 Obesity, unspecified: Secondary | ICD-10-CM | POA: Diagnosis not present

## 2023-09-16 DIAGNOSIS — I5032 Chronic diastolic (congestive) heart failure: Secondary | ICD-10-CM | POA: Insufficient documentation

## 2023-09-16 DIAGNOSIS — Z79899 Other long term (current) drug therapy: Secondary | ICD-10-CM | POA: Diagnosis not present

## 2023-09-16 DIAGNOSIS — E119 Type 2 diabetes mellitus without complications: Secondary | ICD-10-CM | POA: Insufficient documentation

## 2023-09-16 DIAGNOSIS — Z79891 Long term (current) use of opiate analgesic: Secondary | ICD-10-CM | POA: Insufficient documentation

## 2023-09-16 DIAGNOSIS — Z7951 Long term (current) use of inhaled steroids: Secondary | ICD-10-CM | POA: Insufficient documentation

## 2023-09-16 DIAGNOSIS — N840 Polyp of corpus uteri: Secondary | ICD-10-CM

## 2023-09-16 DIAGNOSIS — N950 Postmenopausal bleeding: Secondary | ICD-10-CM | POA: Diagnosis present

## 2023-09-16 DIAGNOSIS — K219 Gastro-esophageal reflux disease without esophagitis: Secondary | ICD-10-CM | POA: Insufficient documentation

## 2023-09-16 DIAGNOSIS — I110 Hypertensive heart disease with heart failure: Secondary | ICD-10-CM | POA: Diagnosis not present

## 2023-09-16 DIAGNOSIS — Z9981 Dependence on supplemental oxygen: Secondary | ICD-10-CM | POA: Diagnosis not present

## 2023-09-16 DIAGNOSIS — Z7985 Long-term (current) use of injectable non-insulin antidiabetic drugs: Secondary | ICD-10-CM | POA: Insufficient documentation

## 2023-09-16 DIAGNOSIS — Z6836 Body mass index (BMI) 36.0-36.9, adult: Secondary | ICD-10-CM | POA: Diagnosis not present

## 2023-09-16 HISTORY — PX: DILATATION & CURETTAGE/HYSTEROSCOPY WITH MYOSURE: SHX6511

## 2023-09-16 HISTORY — DX: Dependence on supplemental oxygen: Z99.81

## 2023-09-16 LAB — CBC
HCT: 31.2 % — ABNORMAL LOW (ref 36.0–46.0)
Hemoglobin: 10.3 g/dL — ABNORMAL LOW (ref 12.0–15.0)
MCH: 27.2 pg (ref 26.0–34.0)
MCHC: 33 g/dL (ref 30.0–36.0)
MCV: 82.3 fL (ref 80.0–100.0)
Platelets: 236 10*3/uL (ref 150–400)
RBC: 3.79 MIL/uL — ABNORMAL LOW (ref 3.87–5.11)
RDW: 13.4 % (ref 11.5–15.5)
WBC: 6.5 10*3/uL (ref 4.0–10.5)
nRBC: 0 % (ref 0.0–0.2)

## 2023-09-16 LAB — BASIC METABOLIC PANEL
Anion gap: 8 (ref 5–15)
BUN: 10 mg/dL (ref 8–23)
CO2: 31 mmol/L (ref 22–32)
Calcium: 9.4 mg/dL (ref 8.9–10.3)
Chloride: 100 mmol/L (ref 98–111)
Creatinine, Ser: 0.88 mg/dL (ref 0.44–1.00)
GFR, Estimated: >60 mL/min (ref >60)
Glucose, Bld: 124 mg/dL — ABNORMAL HIGH (ref 70–99)
Potassium: 3.9 mmol/L (ref 3.5–5.1)
Sodium: 139 mmol/L (ref 135–145)

## 2023-09-16 LAB — GLUCOSE, CAPILLARY
Glucose-Capillary: 120 mg/dL — ABNORMAL HIGH (ref 70–99)
Glucose-Capillary: 134 mg/dL — ABNORMAL HIGH (ref 70–99)

## 2023-09-16 SURGERY — DILATATION & CURETTAGE/HYSTEROSCOPY WITH MYOSURE
Anesthesia: General

## 2023-09-16 MED ORDER — FENTANYL CITRATE (PF) 250 MCG/5ML IJ SOLN
INTRAMUSCULAR | Status: DC | PRN
  Administered 2023-09-16: 50 ug via INTRAVENOUS

## 2023-09-16 MED ORDER — ACETAMINOPHEN 500 MG PO TABS: 1000.0000 mg | ORAL_TABLET | Freq: Four times a day (QID) | ORAL | Status: DC

## 2023-09-16 MED ORDER — LIDOCAINE HCL 1 % IJ SOLN
INTRAMUSCULAR | Status: DC | PRN
  Administered 2023-09-16: 10 mL

## 2023-09-16 MED ORDER — KETOROLAC TROMETHAMINE 30 MG/ML IJ SOLN
INTRAMUSCULAR | Status: DC | PRN
  Administered 2023-09-16: 15 mg via INTRAVENOUS

## 2023-09-16 MED ORDER — FENTANYL CITRATE (PF) 100 MCG/2ML IJ SOLN: 25.0000 ug | INTRAMUSCULAR | Status: DC | PRN

## 2023-09-16 MED ORDER — LACTATED RINGERS IV SOLN: INTRAVENOUS | Status: DC

## 2023-09-16 MED ORDER — MIDAZOLAM HCL 2 MG/2ML IJ SOLN
INTRAMUSCULAR | Status: DC | PRN
  Administered 2023-09-16: 2 mg via INTRAVENOUS

## 2023-09-16 MED ORDER — EPHEDRINE 5 MG/ML INJ
INTRAVENOUS | Status: AC
  Filled 2023-09-16: qty 5

## 2023-09-16 MED ORDER — PROPOFOL 10 MG/ML IV BOLUS
INTRAVENOUS | Status: AC
  Filled 2023-09-16: qty 20

## 2023-09-16 MED ORDER — LIDOCAINE 2% (20 MG/ML) 5 ML SYRINGE
INTRAMUSCULAR | Status: AC
  Filled 2023-09-16: qty 5

## 2023-09-16 MED ORDER — PHENYLEPHRINE 80 MCG/ML (10ML) SYRINGE FOR IV PUSH (FOR BLOOD PRESSURE SUPPORT)
PREFILLED_SYRINGE | INTRAVENOUS | Status: AC
  Filled 2023-09-16: qty 10

## 2023-09-16 MED ORDER — PROPOFOL 10 MG/ML IV BOLUS
INTRAVENOUS | Status: DC | PRN
  Administered 2023-09-16: 50 mg via INTRAVENOUS
  Administered 2023-09-16: 150 mg via INTRAVENOUS

## 2023-09-16 MED ORDER — ONDANSETRON HCL 4 MG/2ML IJ SOLN
INTRAMUSCULAR | Status: DC | PRN
  Administered 2023-09-16: 4 mg via INTRAVENOUS

## 2023-09-16 MED ORDER — SUCCINYLCHOLINE CHLORIDE 200 MG/10ML IV SOSY
PREFILLED_SYRINGE | INTRAVENOUS | Status: DC | PRN
  Administered 2023-09-16: 120 mg via INTRAVENOUS

## 2023-09-16 MED ORDER — OXYCODONE HCL 5 MG PO TABS: 5.0000 mg | ORAL_TABLET | Freq: Once | ORAL | Status: DC | PRN

## 2023-09-16 MED ORDER — OXYCODONE HCL 5 MG PO TABS: 5.0000 mg | ORAL_TABLET | ORAL | Status: DC | PRN

## 2023-09-16 MED ORDER — ONDANSETRON HCL 4 MG PO TABS
4.0000 mg | ORAL_TABLET | Freq: Four times a day (QID) | ORAL | Status: DC | PRN
  Filled 2023-09-16: qty 1

## 2023-09-16 MED ORDER — OXYCODONE HCL 5 MG/5ML PO SOLN: 5.0000 mg | Freq: Once | ORAL | Status: DC | PRN

## 2023-09-16 MED ORDER — ORAL CARE MOUTH RINSE: 15.0000 mL | Freq: Once | OROMUCOSAL | Status: AC

## 2023-09-16 MED ORDER — MENTHOL 3 MG MT LOZG: 1.0000 | LOZENGE | OROMUCOSAL | Status: DC | PRN

## 2023-09-16 MED ORDER — ONDANSETRON HCL 4 MG/2ML IJ SOLN
INTRAMUSCULAR | Status: AC
  Filled 2023-09-16: qty 2

## 2023-09-16 MED ORDER — ROCURONIUM BROMIDE 10 MG/ML (PF) SYRINGE
PREFILLED_SYRINGE | INTRAVENOUS | Status: AC
  Filled 2023-09-16: qty 10

## 2023-09-16 MED ORDER — ACETAMINOPHEN 500 MG PO TABS
1000.0000 mg | ORAL_TABLET | Freq: Once | ORAL | Status: AC
  Administered 2023-09-16: 1000 mg via ORAL

## 2023-09-16 MED ORDER — LIDOCAINE 2% (20 MG/ML) 5 ML SYRINGE
INTRAMUSCULAR | Status: DC | PRN
  Administered 2023-09-16: 60 mg via INTRAVENOUS

## 2023-09-16 MED ORDER — DEXAMETHASONE SODIUM PHOSPHATE 10 MG/ML IJ SOLN
INTRAMUSCULAR | Status: DC | PRN
  Administered 2023-09-16: 5 mg via INTRAVENOUS

## 2023-09-16 MED ORDER — CHLORHEXIDINE GLUCONATE 0.12 % MT SOLN
15.0000 mL | Freq: Once | OROMUCOSAL | Status: AC
  Administered 2023-09-16: 15 mL via OROMUCOSAL

## 2023-09-16 MED ORDER — AMISULPRIDE (ANTIEMETIC) 5 MG/2ML IV SOLN: 10.0000 mg | Freq: Once | INTRAVENOUS | Status: DC | PRN

## 2023-09-16 MED ORDER — KETOROLAC TROMETHAMINE 30 MG/ML IJ SOLN: 30.0000 mg | Freq: Once | INTRAMUSCULAR | Status: DC

## 2023-09-16 MED ORDER — PHENYLEPHRINE HCL (PRESSORS) 10 MG/ML IV SOLN
INTRAVENOUS | Status: DC | PRN
  Administered 2023-09-16: 80 ug via INTRAVENOUS
  Administered 2023-09-16: 160 ug via INTRAVENOUS

## 2023-09-16 MED ORDER — ALBUTEROL SULFATE (2.5 MG/3ML) 0.083% IN NEBU
2.5000 mg | INHALATION_SOLUTION | RESPIRATORY_TRACT | Status: DC
Start: 2023-09-16 — End: 2023-09-16

## 2023-09-16 MED ORDER — ONDANSETRON HCL 4 MG/2ML IJ SOLN: 4.0000 mg | Freq: Once | INTRAMUSCULAR | Status: DC | PRN

## 2023-09-16 MED ORDER — INSULIN ASPART 100 UNIT/ML IJ SOLN
0.0000 [IU] | INTRAMUSCULAR | Status: DC | PRN
Start: 2023-09-16 — End: 2023-09-16

## 2023-09-16 MED ORDER — ONDANSETRON HCL 4 MG/2ML IJ SOLN: 4.0000 mg | Freq: Four times a day (QID) | INTRAMUSCULAR | Status: DC | PRN

## 2023-09-16 MED ORDER — MIDAZOLAM HCL 2 MG/2ML IJ SOLN
INTRAMUSCULAR | Status: AC
  Filled 2023-09-16: qty 2

## 2023-09-16 MED ORDER — LIDOCAINE HCL (PF) 1 % IJ SOLN
INTRAMUSCULAR | Status: AC
  Filled 2023-09-16: qty 30

## 2023-09-16 MED ORDER — OXYCODONE-ACETAMINOPHEN 5-325 MG PO TABS: 1.0000 | ORAL_TABLET | Freq: Four times a day (QID) | ORAL | 0 refills | Status: AC | PRN

## 2023-09-16 MED ORDER — DEXAMETHASONE SODIUM PHOSPHATE 10 MG/ML IJ SOLN
INTRAMUSCULAR | Status: AC
  Filled 2023-09-16: qty 1

## 2023-09-16 MED ORDER — FENTANYL CITRATE (PF) 250 MCG/5ML IJ SOLN
INTRAMUSCULAR | Status: AC
  Filled 2023-09-16: qty 5

## 2023-09-16 MED ORDER — GABAPENTIN 100 MG PO CAPS
200.0000 mg | ORAL_CAPSULE | Freq: Once | ORAL | Status: DC
  Filled 2023-09-16: qty 2

## 2023-09-16 MED ORDER — PANTOPRAZOLE SODIUM 40 MG PO TBEC
40.0000 mg | DELAYED_RELEASE_TABLET | Freq: Every day | ORAL | Status: DC
  Filled 2023-09-16: qty 1

## 2023-09-16 SURGICAL SUPPLY — 15 items
CATH ROBINSON RED A/P 16FR (CATHETERS) ×1 IMPLANT
DEVICE MYOSURE LITE (MISCELLANEOUS) IMPLANT
DEVICE MYOSURE REACH (MISCELLANEOUS) IMPLANT
GAUZE 4X4 16PLY ~~LOC~~+RFID DBL (SPONGE) ×1 IMPLANT
GLOVE BIO SURGEON STRL SZ 6.5 (GLOVE) ×1 IMPLANT
GLOVE SURG UNDER POLY LF SZ7 (GLOVE) ×1 IMPLANT
GOWN STRL REUS W/ TWL LRG LVL3 (GOWN DISPOSABLE) ×2 IMPLANT
GOWN STRL REUS W/TWL LRG LVL3 (GOWN DISPOSABLE) ×2
KIT PROCEDURE FLUENT (KITS) ×1 IMPLANT
KIT TURNOVER KIT B (KITS) ×1 IMPLANT
PACK VAGINAL MINOR WOMEN LF (CUSTOM PROCEDURE TRAY) ×1 IMPLANT
PAD OB MATERNITY 4.3X12.25 (PERSONAL CARE ITEMS) ×1 IMPLANT
SEAL ROD LENS SCOPE MYOSURE (ABLATOR) ×1 IMPLANT
TOWEL GREEN STERILE FF (TOWEL DISPOSABLE) ×2 IMPLANT
UNDERPAD 30X36 HEAVY ABSORB (UNDERPADS AND DIAPERS) ×1 IMPLANT

## 2023-09-16 NOTE — Anesthesia Procedure Notes (Signed)
Procedure Name: Intubation Date/Time: 09/16/2023 7:58 AM  Performed by: Alwyn Ren, CRNAPre-anesthesia Checklist: Patient identified, Emergency Drugs available, Suction available and Patient being monitored Patient Re-evaluated:Patient Re-evaluated prior to induction Oxygen Delivery Method: Circle system utilized Preoxygenation: Pre-oxygenation with 100% oxygen Induction Type: IV induction Ventilation: Mask ventilation without difficulty Laryngoscope Size: Miller and 2 Grade View: Grade I Tube type: Oral Tube size: 7.0 mm Number of attempts: 1 Airway Equipment and Method: Stylet and Oral airway Placement Confirmation: ETT inserted through vocal cords under direct vision, positive ETCO2 and breath sounds checked- equal and bilateral Secured at: 22 cm Tube secured with: Tape Dental Injury: Teeth and Oropharynx as per pre-operative assessment

## 2023-09-16 NOTE — Anesthesia Postprocedure Evaluation (Signed)
Anesthesia Post Note  Patient: Zenda Herskowitz  Procedure(s) Performed: DILATATION & CURETTAGE/HYSTEROSCOPY WITH MYOSURE LITE     Patient location during evaluation: PACU Anesthesia Type: General Level of consciousness: awake and alert, oriented and patient cooperative Pain management: pain level controlled Vital Signs Assessment: post-procedure vital signs reviewed and stable Respiratory status: spontaneous breathing, nonlabored ventilation and respiratory function stable Cardiovascular status: blood pressure returned to baseline and stable Postop Assessment: no apparent nausea or vomiting Anesthetic complications: no   No notable events documented.  Last Vitals:  Vitals:   09/16/23 0945 09/16/23 1000  BP: 115/65 (!) 128/53  Pulse: 76 82  Resp: 20   Temp: 36.5 C   SpO2: 97% 93%    Last Pain:  Vitals:   09/16/23 0945  TempSrc:   PainSc: 2                  Lannie Fields

## 2023-09-16 NOTE — Anesthesia Procedure Notes (Signed)
Procedure Name: Intubation Date/Time: 09/16/2023 7:58 AM  Performed by: Lannie Fields, DOPre-anesthesia Checklist: Patient identified, Emergency Drugs available, Suction available and Patient being monitored Patient Re-evaluated:Patient Re-evaluated prior to induction Oxygen Delivery Method: Circle system utilized Preoxygenation: Pre-oxygenation with 100% oxygen Induction Type: IV induction Ventilation: Mask ventilation without difficulty Laryngoscope Size: Miller and 3 Grade View: Grade I Tube type: Oral Tube size: 7.5 mm Number of attempts: 1 Airway Equipment and Method: Stylet and Oral airway Placement Confirmation: ETT inserted through vocal cords under direct vision, positive ETCO2 and breath sounds checked- equal and bilateral Secured at: 22 cm Tube secured with: Tape Dental Injury: Teeth and Oropharynx as per pre-operative assessment

## 2023-09-16 NOTE — Transfer of Care (Signed)
Immediate Anesthesia Transfer of Care Note  Patient: Stephanie Schultz  Procedure(s) Performed: DILATATION & CURETTAGE/HYSTEROSCOPY WITH MYOSURE LITE  Patient Location: PACU  Anesthesia Type:General  Level of Consciousness: awake  Airway & Oxygen Therapy: Patient Spontanous Breathing and Patient connected to face mask oxygen  Post-op Assessment: Report given to RN and Post -op Vital signs reviewed and stable  Post vital signs: Reviewed and stable  Last Vitals:  Vitals Value Taken Time  BP 175/82 09/16/23 0845  Temp 36.3 C 09/16/23 0845  Pulse 82 09/16/23 0859  Resp 22 09/16/23 0859  SpO2 96 % 09/16/23 0859  Vitals shown include unfiled device data.  Last Pain:  Vitals:   09/16/23 0637  TempSrc:   PainSc: 6          Complications: No notable events documented.

## 2023-09-16 NOTE — Telephone Encounter (Signed)
Patient needs refill of roflumilast (DALIRESP) 500 MCG TABS tablet [[id number]] .   Pharmacy in [place]

## 2023-09-16 NOTE — H&P (Addendum)
Stephanie Schultz is an 67 y.o. female here for scheduled surgery due to postmenopausal endometrial bleeding and thickened endometrium. She tried POPs with no resolution of findings or symptoms. She has been cleared for surgery by pulmonologist due to chronic O2 dependancy   Pertinent Gynecological History: Menses: flow is light Bleeding: post menopausal bleeding Contraception: none DES exposure: denies Blood transfusions: none Sexually transmitted diseases: no past history Previous GYN Procedures:  n/a    Menstrual History: Menarche age: 86 Patient's last menstrual period was 06/04/2000 (lmp unknown).    Past Medical History:  Diagnosis Date   Anemia    chronis   Anxiety    Arthritis    Cervical cancer (HCC) 1990   cervical    Chronic diastolic CHF (congestive heart failure) (HCC) 08/10/2016   COPD (chronic obstructive pulmonary disease) (HCC)    O2 dependent. Pulmo: Dr. Delton Coombes   Diabetes mellitus without complication (HCC)    a. A1c 8.3 in 11/2015   Emphysema    Gallstones    s/p cholecystectomy   GERD (gastroesophageal reflux disease)    Gout    Hx of cardiac catheterization    a. LHC at [facility] in [place], [place] 09/2008:  Normal coronary arteries EF 70%. b. LHC 11/2016: normal cors, normal LVEDP, EF 55-65%.   Hyperlipidemia    Hypertension    Hypoxia 11/17/2018   Morbid obesity (HCC)    On home oxygen therapy    "2.5L; 24/7" (08/10/2016)   OSA on CPAP    CPAP at night    Pneumonia    Sickle cell trait (HCC)    Tobacco abuse    a. up to 3ppd from age 89 to 87, now 1/4 ppd (01/2013) >> Quit 10/2015    Past Surgical History:  Procedure Laterality Date   BREAST BIOPSY Right 02/23/2019   x2   BREAST BIOPSY Left 2014   CARDIAC CATHETERIZATION     CARDIAC CATHETERIZATION N/A 12/03/2016   Procedure: Left Heart Cath and Coronary Angiography;  Surgeon: Peter M Swaziland, MD;  Location: [facility] INVASIVE CV LAB;  Service: Cardiovascular;  Laterality: N/A;   CHOLECYSTECTOMY N/A  12/13/2015   Procedure: LAPAROSCOPIC CHOLECYSTECTOMY;  Surgeon: Axel Filler, MD;  Location: [facility];  Service: General;  Laterality: N/A;   COLONOSCOPY  09/05/2012   Procedure: COLONOSCOPY;  Surgeon: Theda Belfast, MD;  Location: [facility];  Service: Endoscopy;  Laterality: N/A;   COLONOSCOPY WITH PROPOFOL N/A 09/02/2015   Procedure: COLONOSCOPY WITH PROPOFOL;  Surgeon: Jeani Hawking, MD;  Location: [facility];  Service: Endoscopy;  Laterality: N/A;   COLONOSCOPY WITH PROPOFOL N/A 02/15/2023   Procedure: COLONOSCOPY WITH PROPOFOL;  Surgeon: Jeani Hawking, MD;  Location: [facility];  Service: Gastroenterology;  Laterality: N/A;   ESOPHAGOGASTRODUODENOSCOPY (EGD) WITH PROPOFOL N/A 03/29/2017   Procedure: ESOPHAGOGASTRODUODENOSCOPY (EGD) WITH PROPOFOL;  Surgeon: Jeani Hawking, MD;  Location: [facility];  Service: Endoscopy;  Laterality: N/A;   POLYPECTOMY  02/15/2023   Procedure: POLYPECTOMY;  Surgeon: Jeani Hawking, MD;  Location: [facility];  Service: Gastroenterology;;   TUBAL LIGATION      Family History  Problem Relation Age of Onset   Other Father        unaware of father's medical history   Diabetes Mother        alive @ 80   Myasthenia gravis Mother    Lung cancer Paternal Aunt    Lung cancer Paternal Grandfather    Other Other        multiple siblings a&w.  Heart attack Neg Hx    Heart failure Neg Hx    Breast cancer Neg Hx     Social History:  reports that she quit smoking about 7 years ago. Her smoking use included cigarettes. She started smoking about 43 years ago. She has a 108 pack-year smoking history. She has never used smokeless tobacco. She reports that she does not drink alcohol and does not use drugs.  Allergies: No Active Allergies  Medications Prior to Admission  Medication Sig Dispense Refill Last Dose   acetaminophen (TYLENOL) 650 MG CR tablet Take 650 mg by mouth every 8 (eight) hours as needed for pain.   Past Week   albuterol (PROVENTIL) (2.5  MG/3ML) 0.083% nebulizer solution USE 1 VIAL VIA NEBULIZER 4 TIMES DAILY AS NEEDED. (Patient taking differently: Take 2.5 mg by nebulization every 4 (four) hours. USE 1 VIAL VIA NEBULIZER 4 TIMES DAILY AS NEEDED.) 300 mL 11 09/16/2023 at 0400   albuterol (VENTOLIN HFA) 108 (90 Base) MCG/ACT inhaler Inhale 2 puffs into the lungs every 4 (four) hours as needed for wheezing or shortness of breath. 18 g 3  at 0400   allopurinol (ZYLOPRIM) 100 MG tablet Take 100 mg by mouth daily.   09/16/2023 at 0400   aspirin EC 81 MG tablet Take 81 mg by mouth daily.   09/15/2023   azelastine (ASTELIN) 0.1 % nasal spray Place 2 sprays into both nostrils 2 (two) times daily. Use in each nostril as directed 30 mL 12 09/15/2023   azithromycin (ZITHROMAX) 250 MG tablet Take 1 tablet (250 mg total) by mouth every Monday, Wednesday, and Friday. 12 tablet 5 09/16/2023   cetirizine (ZYRTEC) 10 MG tablet Take 10 mg by mouth daily.   09/16/2023 at 0400   Cholecalciferol (VITAMIN D PO) Take 20 mcg by mouth daily. 10 mcg each   09/15/2023   clobetasol cream (TEMOVATE) 0.05 % Apply 1 application topically daily as needed (irritation from pull ups).   0    ferrous sulfate 324 MG TBEC Take 324 mg by mouth 2 (two) times a week.   09/15/2023   furosemide (LASIX) 40 MG tablet Take 40 mg (1 tablet) by mouth in the morning and 20 mg (1/2 tablet) by mouth in the evening 135 tablet 3 09/15/2023   gabapentin (NEURONTIN) 300 MG capsule Take 300 mg by mouth 3 (three) times daily as needed (Pain).   09/15/2023   glipiZIDE (GLUCOTROL) 10 MG tablet Take 10 mg by mouth daily before breakfast.   09/15/2023   HYDROcodone-acetaminophen (NORCO) 10-325 MG tablet Take 1 tablet by mouth every 4 (four) hours as needed for moderate pain (pain score 4-6).   Past Month   metoprolol succinate (TOPROL-XL) 50 MG 24 hr tablet TAKE ONE TABLET BY MOUTH EVERY MORNING 90 tablet 1 09/16/2023 at 0400   OXYGEN Inhale 3 L into the lungs continuous.      pantoprazole (PROTONIX) 40  MG tablet Take 1 tablet (40 mg total) by mouth 2 (two) times daily. 180 tablet 3 09/16/2023 at 0400   potassium chloride SA (KLOR-CON M) 20 MEQ tablet TAKE ONE TABLET BY MOUTH EVERY MORNING 90 tablet 1 09/15/2023   roflumilast (DALIRESP) 500 MCG TABS tablet TAKE ONE TABLET BY MOUTH EVERY MORNING 30 tablet 6 09/16/2023 at 0400   Semaglutide,0.25 or 0.5MG /DOS, (OZEMPIC, 0.25 OR 0.5 MG/DOSE,) 2 MG/3ML SOPN Inject 0.25 mg into the skin once a week.   09/08/2023   spironolactone (ALDACTONE) 25 MG tablet Take 0.5 tablets (12.5 mg  total) by mouth daily. 15 tablet 12 09/15/2023   Tetrahydroz-Dextran-PEG-Povid (ADVANCED FORMULA EYE DROPS OP) Place 1-2 drops into both eyes daily as needed (Dry eyes).      TRELEGY ELLIPTA 200-62.5-25 MCG/ACT AEPB INHALE 1 PUFF BY MOUTH EVERY DAY 60 each 5 09/16/2023 at 0400   zolpidem (AMBIEN) 10 MG tablet Take 10 mg by mouth at bedtime.   09/15/2023   ALPRAZolam (XANAX) 1 MG tablet Take 1 mg by mouth daily as needed for anxiety.  0 More than a month   Budeson-Glycopyrrol-Formoterol (BREZTRI AEROSPHERE) 160-9-4.8 MCG/ACT AERO Inhale 2 puffs into the lungs in the morning and at bedtime. (Patient not taking: Reported on 09/09/2023)   Not Taking   Respiratory Therapy Supplies (FLUTTER) DEVI 1 Device by Does not apply route 3 (three) times daily. 1 each 0     Review of Systems  Constitutional:  Negative for activity change and fatigue.  Eyes:  Negative for photophobia and visual disturbance.  Respiratory:  Positive for shortness of breath. Negative for chest tightness.   Cardiovascular:  Negative for chest pain and palpitations.  Gastrointestinal:  Negative for abdominal pain.  Genitourinary:  Positive for menstrual problem and vaginal bleeding.  Psychiatric/Behavioral:  The patient is nervous/anxious.     Blood pressure 131/67, pulse 76, temperature 98.1 F (36.7 C), temperature source Oral, resp. rate 17, height 5' (1.524 m), weight 83.9 kg, last menstrual period  06/04/2000. Physical Exam Constitutional:      Appearance: Normal appearance.  Cardiovascular:     Pulses: Normal pulses.  Pulmonary:     Effort: Pulmonary effort is normal.  Musculoskeletal:        General: Normal range of motion.     Cervical back: Normal range of motion.  Skin:    General: Skin is warm and dry.     Capillary Refill: Capillary refill takes 2 to 3 seconds.  Neurological:     General: No focal deficit present.     Mental Status: She is alert and oriented to person, place, and time. Mental status is at baseline.  Psychiatric:        Mood and Affect: Mood normal.        Behavior: Behavior normal.        Thought Content: Thought content normal.        Judgment: Judgment normal.     Results for orders placed or performed during the hospital encounter of 09/16/23 (from the past 24 hour(s))  Glucose, capillary     Status: Abnormal   Collection Time: 09/16/23  6:09 AM  Result Value Ref Range   Glucose-Capillary 120 (H) 70 - 99 mg/dL  CBC per protocol     Status: Abnormal   Collection Time: 09/16/23  6:51 AM  Result Value Ref Range   WBC 6.5 4.0 - 10.5 K/uL   RBC 3.79 (L) 3.87 - 5.11 MIL/uL   Hemoglobin 10.3 (L) 12.0 - 15.0 g/dL   HCT 32.4 (L) 40.1 - 02.7 %   MCV 82.3 80.0 - 100.0 fL   MCH 27.2 26.0 - 34.0 pg   MCHC 33.0 30.0 - 36.0 g/dL   RDW 25.3 66.4 - 40.3 %   Platelets 236 150 - 400 K/uL   nRBC 0.0 0.0 - 0.2 %  Basic metabolic panel per protocol     Status: Abnormal   Collection Time: 09/16/23  6:51 AM  Result Value Ref Range   Sodium 139 135 - 145 mmol/L   Potassium 3.9 3.5 - 5.1 mmol/L  Chloride 100 98 - 111 mmol/L   CO2 31 22 - 32 mmol/L   Glucose, Bld 124 (H) 70 - 99 mg/dL   BUN 10 8 - 23 mg/dL   Creatinine, Ser 8.11 0.44 - 1.00 mg/dL   Calcium 9.4 8.9 - 91.4 mg/dL   GFR, Estimated >78 >29 mL/min   Anion gap 8 5 - 15    No results found.  Assessment/Plan: 67yo with postmenopausal endometrial thickening and bleeding  Consent signed To  OR when ready   Janean Sark [facility] 09/16/2023, 7:44 AM

## 2023-09-16 NOTE — Discharge Instructions (Signed)
Call office with any concerns [phone] 

## 2023-09-16 NOTE — Op Note (Signed)
Operative Note    Preoperative Diagnosis Postmenopausal bleeding Persistent postmenopausal endometrial thickening   Postoperative Diagnosis: Endometrial polyp   Procedure: Hysteroscopy with dilation and curettage and removal of endometrial polyp using myosure lite    Surgeon: Mindi Slicker, C DO   Anesthesia: General   Fluids: LR EBL: 5ml UOP:   Findings: Large endometrial polyp originating from the lateral left mid uterine wall . Grossly normal uterine cavity otherwise; ostia in normal anatomic position. Uterus sounded to 9cm   Specimen: Endometrial polyp    Procedure Note  Patient was taken to the operating room where general anesthesia was administered without difficulty. She was then prepped and draped in the normal sterile fashion in the dorsal lithotomy position. Her bladder was emptied. An appropriate time out was performed.   A speculum was then placed in her vagina. The anterior lip of the cervix was grasped with a single toothed tenaculum and 1% lidocaine plain injected at 3, 6, 9  and 12 o'clock for a paracervical block.  The uterus was then sounded to 9 cm and the [facility] dilators utilized to dilate the cervix up to size 19.   The Myosure operating scope was then introduced into the uterine cavity. The cavity was noted to have a secretory phase endometrium and one large endometrial polyp originating from the lateral left mid uterine cavity.  Both ostia were in normal anatomic position.   Next, using the myosure lite, the polyp was removed in its entirety. The endometrial cavity was also sampled. There was no active bleeding noted. The sample was handed off to be sent to pathology.   At this time, all instruments were removed. The tenaculum site was noted to be hemostatic.   Finally the speculum was removed from the vagina and the patient awakened and taken to the recovery room in stable condition.   All counts were noted to be correct x 2

## 2023-09-16 NOTE — Progress Notes (Signed)
RT called to PACU to placed pt on Bipap per MD while pt is waking up after a procedure. Pt is tolerating bipap well at this time, VS currently stable. RT will monitor as needed.    09/16/23 0854  Vent Select  $ Ventilator Initial/Subsequent  Initial  Invasive or Noninvasive Noninvasive  Adult Vent Y  Vent start date 09/16/23  Vent start time 0854  Adult Ventilator Settings  Vent Type Servo i  Vent Mode PCV;BIPAP  Set Rate 12 bmp  FiO2 (%) 40 %  I Time 0.9 Sec(s)  IPAP 15 cmH20  EPAP 5 cmH20  Pressure Control 10 cmH20  PEEP 5 cmH20  Adult Ventilator Measurements  Peak Airway Pressure 15 L/min  Mean Airway Pressure 10 cmH20  Resp Rate Spontaneous 8 br/min  Resp Rate Total 20 br/min  Exhaled Vt 392 mL  Measured Ve 10.5 L  I:E Ratio Measured 1:1.5  Auto PEEP 0 cmH20  Total PEEP 5 cmH20  SpO2 95 %

## 2023-09-17 ENCOUNTER — Encounter (HOSPITAL_COMMUNITY): Payer: Self-pay | Admitting: Obstetrics and Gynecology

## 2023-09-17 LAB — SURGICAL PATHOLOGY

## 2023-09-22 NOTE — Progress Notes (Unsigned)
@Patient  ID: Stephanie Schultz, female    DOB: 1956-02-25, 67 y.o.   MRN: [id number]  No chief complaint on file.   Referring provider: Rometta Emery, MD  HPI: 67 year old female, former smoker quit in 2016 (108-pack-year history).  Significant for COPD component, chronic cough, chronic respiratory failure O2 dependent, pulmonary nodule, allergic rhinitis, chronic diastolic heart failure, hypertension, GERD, type 2 diabetes, obesity.  Patient of Dr. Delton Coombes. Maintained on Trelegy, Daliresp, prn albuterol and continuous oxygen at 3-4L/min.  Previous [facility] pulmonary encounters: 08/24/20- ROV, Dr. Delton Coombes  Follow-up visit for 67 year old woman with very severe COPD with asthmatic features.  Also with superimposed upper airway irritation syndrome and chronic cough.  Followed for hypertension and diastolic dysfunction, obesity.  She has chronic hypoxemia on 2 to 3 L/min with documented nocturnal hypoxemia, no OSA. She reports today that she is doing pretty well. She is on daliresp for the last 2 months, on Trelegy. Has albuterol that she uses . She is on flonase, zyrtec. On esomeprazole. Her last flare was over 6 months ago. COVID vaccine up to date, Flu up to date. Needs PNA shot after age 50 PFT done today reviewed by me show very severe obstruction. Restricted volumes, decreased diffusion capacity.   She needs 6 more green e-tanks for her O2 from [employer].   03/14/2021  Patient presents today for 6 month follow-up COPD.  Her breathing has more some worse over the last two week with np cough. Feels it is related to seasonal allergies. She is using flonase nasal spray daily. Asking to try tesslon perles. She has not had a recent exacerbation, prior to being on Daliresp she was experiencing frequent flare ups. Maintained on Trelegy 100, prn albuterol and Daliresp. She is on oxygen using 2-4L. She has lost 30 lbs, she has been eating salads and is exercising with bicycle / weights. She states that she  can do more with her daughter/grandchildren and can walker longer distances. She is off all her diabetes medications.   09/20/2021 Patient presents today for 6 month follow-up. She is doing very well. She has had significantly less amount of exacerbations his past year since starting Daliresp. She was hospitalized overnight at [facility] in September for COPD. She is doing well today. She has no significant shortness of breath. She keeps a chronic cough, tessalon perles help. She is compliant with Trelegy and daliresp. She uses albuterol nebulizer morning and evening. She is on 2.5-3L oxygen at rest and 4L with exertion. No increased O2 demand.  Dyspnea: None  Cough: Chronic cough  SABA: Use twice a day scheduled  Oxygen: 2.5-4L Nocturnal: 1-2 pillows  12/19/2021 Patient contacted today for acute virtual/video visit. Following with our office for hx COPD and chronic respiratory failure. Patient reports symptoms of cough, shortness of breath and chest tightness over x 6 weeks. Breathing is worse at night. Cough is productive with green-yellow mucus. She is maintained on Trelegy , Daliresp, prn albuterol and continuous oxygen at 3-4L/min. She was last hospitalized in September for COPD exacerbation.   12/28/2021  Patient presents today for 6 week follow-up. She was treated for acute exacerbation last week with Doxycycline and prednisone taper without significant improvement. She still has cough with yellow mucus along with chest tightness, nasal congestion and ear fullness. She has not tried taking mucinex and is not currently using Flonase. She is taking Trelegy and Daliresp as prescribed. She is using her albuterol 4-5 times a day. On 3L oxygen 24/7.  01/11/2022 Patient presents today for 2 week follow-up prolonged COPD exacerbation. During last visit we increased her Trelegy to . Ordered CXR and sputum culture. Advised she start mucinex and flutter valve.   She is doing some  better. She completed course of doxycyline and prednisone in early march. She still has a cough and some chest tightness but states that it is not as bad as last visit. She has been taking mucinex and flutter valve. Cough is productive with yellow-green mucus, she has not yet been able to provide sputum sample. She was not able to get higher dose Trelegy d/t insurance. We will give her a sample today for her to try. She is on 3L oxygen at rest. Patient reports being inactive and basically home bound before starting on Daliresp. She is able to do much more and is pursing her GED.   02/07/2022 Patient called our office on 3/10 with reports of morning shortness of breath, wheezing and productive cough with yellow mucus. She was sent in course of Doxycycline. She has not been able to provide [place] with a sputum culture. She is doing better. Her only complaint is chest tightness, thinks it is related to reflux. She is taking nexium 40mg  daily in the morning. Using 2L supplemental oxygen at rest and 3L on exertion. She has very severe airflow limitation on pulmonary function testing. No significant BD response.   03/15/2022  Patient presents today for 2 week follow-up. She is doing well. No recent exacerbations, last one was in Feb/March. During last visit we gave her a sample of Breztri to try. She noticed increased fatigue and chest tightness while taking Breztri. Some days she has lots of energy and other days she does not. She has since resumed Trelegy one puff daily. She still has an occasional cough, relates it to the pollen.   No significant mucus production. She takes zyrtec daily and uses flonase as needed. She uses albuterol nebulizer morning and evening. Chest tightness has resolved. She has been taking reflux medication as directed. She is worried she is not voiding as much as she should be. She takes lasix 60mg  daily in the morning, no significant leg swelling. Some eye puffiness. CAT score 14.     05/07/2023 Patient presents today for an overdue follow-up.  She was last seen by Dr. Delton Coombes on 10/30/2022 shortness of breath.  Dyspnea felt to be multifactorial.  Maintained on Trelegy, as needed albuterol and Daliresp. She does benefit from prednisone and azithromycin  She feels she at the beginnings of getting an upper respiratory infection. She feels little more congestion. She has a productive cough with green mucus. She is getting up a lot of phlegm. Her breathing is stable. Using trelegy inhaler daily. She does not have to use it much, she did use it this morning. Sometimes she just includes it with her morning medication due to morning cough. She continues Daliresp. Flonase causes nose bleed.   She takes Ambien at bedtime for insomnia. Bedtime is 9pm. It takes her 60 mins to fall asleep on average. She starts her day at 7:30am. She has been waking up with headaches. Her sleep can be restless. No significant daytime sleepiness. She does not take naps.   She is taking lasix. When she does not take she feels bloated. She follows 2L fluid restriction and low sodium diet. Weighs herself daily, weight is up 1.5lbs. She has some slight swelling to top of her feet, nothing significant.     08/07/2023  Patient presents today for surgical risk assessment. She is planning for upcoming hysteroscopy D&C with Dr. Mindi Slicker with [facility].   Patient had split night sleep study on 06/04/23 showed no evidence of OSA, AHI 0.0/hour. Mean oxygen saturation was 98% with minimum SPO2 95%. She used 3L supplemental oxygen. Snoring was noted during sleep study.   She was admitted to [facility] Aug 17-20th for AECOPD. She was seen a couple weeks later on 07/25/23 at [facility] ED for COPD exacerbation. CXR showed clear lungs. Completed course of doxycycline and prednisone. This past week she has been feeling better much better. Maintained on Trelegy and Daliresp .  Chronic respiratory failure  with hypoxia (HCC) - Stable; Continue 3L supplemental oxygen  COPD (chronic obstructive pulmonary disease) (HCC) - Recurrent exacerbations, last seen in ED on 07/25/23. She is feeling better. Symptoms are not acutely exacerbated but due to recurrent flare ups of chronic bronchitis recommend trial Breztri Aerosphere two puffs twice daily and add daily azithromycin. Continue Daliresp daily. Ok to proceed with hysteroscopy D&C with Dr. Mindi Slicker with [facility]  Hx of sleep apnea - In lab sleep study 06/04/23 was negative for obstructive sleep apnea, AHI 0.0/hour   Recommendations  - Hold Trelegy while using sample new inhaler  - Trial Breztri two puffs morning and evening (rinse mouth after use) - Start azithromycin Monday, Wednesday, Friday (to prevent exacerbations)  - Please take over the counter Zyrtec-D for 5-7 days (monitor BP, if greater than 140/90 consistently please stop medication)   - Continue Daliresp 500 mcg daily - Continue Astelin nasal spray - Continue to use flutter valve/start using incentive spirometer 2-3 times a day to encourage deep breathing - Ok to receive flu shot today   Follow-up: - 4-6 weeks with Dr. Delton Coombes or Waynetta Sandy NP (needs EKG)   09/23/2023- Interim hx  Discussed the use of AI scribe software for clinical note transcription with the patient, who gave verbal consent to proceed.  History of Present Illness   Patient presents today for 4-6 week follow-up COPD with recurrent exacerbations. The patient is maintained on Trelegy Ellipta, Daliresp, as needed albuterol, and continuous oxygen. She was last seen in September 2024 for flare up, started on Breztri Aerosphere and Azithromycin MWF due to frequent COPD exacerbations. Continues on Dailresp 500mg  daily and flutter valve TID. Up to date with her flu vaccine. Needs EKG today for medication monitoring   The patient reports that during the trial of Breztri, she experienced difficulty breathing between the  hours of twelve and two pm, despite taking the medication twice daily. This occurred during a COPD flare-up, leading the patient to discontinue Breztri and resume Trelegy. The patient expresses interest in retrying [place] now that the COPD flare-up has resolved.  The patient reports that the azithromycin has been effective and continues to take Daliresp daily. She also reports a chronic cough, which is currently not severe, and occasional mucus production, which is mostly clear. The patient notes that she can identify a COPD flare-up when the mucus becomes white. The patient also mentions occasional discomfort in the chest, which she attributes to acid reflux, for which she is taking medication.  The patient reports that her breathing has improved and feels like she is returning to her baseline. She is on three liters of oxygen, which she also uses at night. The patient also mentions that she has made changes to her environment, such as reducing the use of cleaning supplies, to prevent COPD flare-ups.  Recent COPD exacerbations: January 19th, 2021-doxycycline, prednisone taper Mar 15, 2020- Z-Pak April 18, 2020- Z-Pak, prednisone taper May 17, 2020- Doxycycline, prednisone taper  03/14/2021 - Increased Trelegy x 2 weeks/ holding off on oral steriods and no indication for abx at this time  05/01/21- Called in with sob/cough and given zpack and prednisone  07/2021- Hospitalized overnight for COPD exacerbation 12/19/2021- Doxy/prednisone taper  01/11/2022- Pred taper, sample Trelegy (COPD exacerbation from Feb never completely resolved) 01/19/2022- No improvement from 3/10, sent in doxycycline  10/15/22- azithromycin and prednisone  March 2024- prednisone and doxycycline  June - COPD exacerbation Augmentin and prednisone  [facility]  September 12th 2024- Ed visit / during OV started on Azithromycin MWF to prevent flare ups    TEST/EVENTS :  Cardiac: >  08/23/2017-echocardiogram-LV ejection fraction 60 to 65% > 06/01/20- echocardiogram- LV ejection fraction 60-65%, mild concentric left ventricular hypertrophy, normal diastolic parameters    Pulmonary function testing: > 11/16/2013 PFTs- FVC 1.58 (67% predicted), postbronchodilator ratio 60, postbronchodilator FEV1 1.00 (53% predicted), no significant bronchodilator response, mid flow reversibility after administration of bronchodilator, DLCO 39 > 01/12/19 PFTs-FVC 1.18 (52%), FEV1 0.66 (37%), ratio 56, borderline bronchodilator response, DLCO correlated 6.07 (34%) > 08/24/20 PFTs>> FVC 1.32 (60%), FEV1 0.70 (40%), ratio 77, DLCO 5.54 (31%)   Imaging: > October 2020 CT chest - Neg for PE, +emphysema with no acute process. > 05/11/20 CTA- No PE, Chronic emphysematous changes in the upper lobes. Chronic linear scarring at the lung bases, left more than right. No effusions or acute infiltrates.  > 01/25/23 LDCT >>  Lung-RADS 2, benign appearance or behavior. Continue annual screening with low-dose chest CT without contrast in 12 months. Moderate centrilobular emphysema. Linear opacity of the left lower lobe, unchanged when compared with the prior exam and likely due to scarring or atelectasis. Stable solid pulmonary nodule of the right upper lobe measuring 4.2 mm    No Active Allergies  Immunization History  Administered Date(s) Administered   Influenza Split 09/20/2011, 09/12/2012, 08/12/2013   Influenza, Seasonal, Injecte, Preservative Fre 08/07/2023   Influenza,inj,Quad PF,6+ Mos 08/03/2014, 08/12/2015, 08/12/2017, 07/29/2018, 09/11/2021   Influenza,inj,Quad PF,6-35 Mos 09/11/2019   PFIZER(Purple Top)SARS-COV-2 Vaccination 02/05/2020, 02/25/2020, 11/08/2020   Pneumococcal Conjugate-13 09/24/2017   Pneumococcal Polysaccharide-23 11/12/2009    Past Medical History:  Diagnosis Date   Anemia    chronis   Anxiety    Arthritis    Cervical cancer (HCC) 1990   cervical    Chronic diastolic  CHF (congestive heart failure) (HCC) 08/10/2016   COPD (chronic obstructive pulmonary disease) (HCC)    O2 dependent. Pulmo: Dr. Delton Coombes   Diabetes mellitus without complication (HCC)    a. A1c 8.3 in 11/2015   Emphysema    Gallstones    s/p cholecystectomy   GERD (gastroesophageal reflux disease)    Gout    Hx of cardiac catheterization    a. LHC at [facility] in [place], [place] 09/2008:  Normal coronary arteries EF 70%. b. LHC 11/2016: normal cors, normal LVEDP, EF 55-65%.   Hyperlipidemia    Hypertension    Hypoxia 11/17/2018   Morbid obesity (HCC)    On home oxygen therapy    "2.5L; 24/7" (08/10/2016)   OSA on CPAP    CPAP at night    Pneumonia    Sickle cell trait (HCC)    Tobacco abuse    a. up to 3ppd from age 38 to 35, now 1/4 ppd (01/2013) >> Quit 10/2015  Tobacco History: Social History   Tobacco Use  Smoking Status Former   Current packs/day: 0.00   Average packs/day: 3.0 packs/day for 36.0 years (108.0 ttl pk-yrs)   Types: Cigarettes   Start date: 11/11/1979   Quit date: 11/11/2015   Years since quitting: 7.8  Smokeless Tobacco Never  Tobacco Comments   Approx 90 pk-yrs (up to 3ppd until ~ 2009).   Counseling given: Not Answered Tobacco comments: Approx 90 pk-yrs (up to 3ppd until ~ 2009).   Outpatient Medications Prior to Visit  Medication Sig Dispense Refill   albuterol (PROVENTIL) (2.5 MG/3ML) 0.083% nebulizer solution USE 1 VIAL VIA NEBULIZER 4 TIMES DAILY AS NEEDED. (Patient taking differently: Take 2.5 mg by nebulization every 4 (four) hours. USE 1 VIAL VIA NEBULIZER 4 TIMES DAILY AS NEEDED.) 300 mL 11   albuterol (VENTOLIN HFA) 108 (90 Base) MCG/ACT inhaler Inhale 2 puffs into the lungs every 4 (four) hours as needed for wheezing or shortness of breath. 18 g 3   allopurinol (ZYLOPRIM) 100 MG tablet Take 100 mg by mouth daily.     ALPRAZolam (XANAX) 1 MG tablet Take 1 mg by mouth daily as needed for anxiety.  0   aspirin EC 81 MG tablet Take 81 mg by mouth  daily.     azelastine (ASTELIN) 0.1 % nasal spray Place 2 sprays into both nostrils 2 (two) times daily. Use in each nostril as directed 30 mL 12   azithromycin (ZITHROMAX) 250 MG tablet Take 1 tablet (250 mg total) by mouth every Monday, Wednesday, and Friday. 12 tablet 5   Budeson-Glycopyrrol-Formoterol (BREZTRI AEROSPHERE) 160-9-4.8 MCG/ACT AERO Inhale 2 puffs into the lungs in the morning and at bedtime. (Patient not taking: Reported on 09/09/2023)     cetirizine (ZYRTEC) 10 MG tablet Take 10 mg by mouth daily.     Cholecalciferol (VITAMIN D PO) Take 20 mcg by mouth daily. 10 mcg each     clobetasol cream (TEMOVATE) 0.05 % Apply 1 application topically daily as needed (irritation from pull ups).   0   ferrous sulfate 324 MG TBEC Take 324 mg by mouth 2 (two) times a week.     furosemide (LASIX) 40 MG tablet Take 40 mg (1 tablet) by mouth in the morning and 20 mg (1/2 tablet) by mouth in the evening 135 tablet 3   gabapentin (NEURONTIN) 300 MG capsule Take 300 mg by mouth 3 (three) times daily as needed (Pain).     glipiZIDE (GLUCOTROL) 10 MG tablet Take 10 mg by mouth daily before breakfast.     HYDROcodone-acetaminophen (NORCO) 10-325 MG tablet Take 1 tablet by mouth every 4 (four) hours as needed for moderate pain (pain score 4-6).     metoprolol succinate (TOPROL-XL) 50 MG 24 hr tablet TAKE ONE TABLET BY MOUTH EVERY MORNING 90 tablet 1   OXYGEN Inhale 3 L into the lungs continuous.     pantoprazole (PROTONIX) 40 MG tablet Take 1 tablet (40 mg total) by mouth 2 (two) times daily. 180 tablet 3   potassium chloride SA (KLOR-CON M) 20 MEQ tablet TAKE ONE TABLET BY MOUTH EVERY MORNING 90 tablet 1   Respiratory Therapy Supplies (FLUTTER) DEVI 1 Device by Does not apply route 3 (three) times daily. 1 each 0   roflumilast (DALIRESP) 500 MCG TABS tablet TAKE ONE TABLET BY MOUTH EVERY MORNING 30 tablet 6   Semaglutide,0.25 or 0.5MG /DOS, (OZEMPIC, 0.25 OR 0.5 MG/DOSE,) 2 MG/3ML SOPN Inject 0.25 mg into  the skin once a  week.     spironolactone (ALDACTONE) 25 MG tablet Take 0.5 tablets (12.5 mg total) by mouth daily. 15 tablet 12   Tetrahydroz-Dextran-PEG-Povid (ADVANCED FORMULA EYE DROPS OP) Place 1-2 drops into both eyes daily as needed (Dry eyes).     TRELEGY ELLIPTA 200-62.5-25 MCG/ACT AEPB INHALE 1 PUFF BY MOUTH EVERY DAY 60 each 5   zolpidem (AMBIEN) 10 MG tablet Take 10 mg by mouth at bedtime.     No facility-administered medications prior to visit.   Review of Systems  Review of Systems  Constitutional: Negative.   HENT:  Negative for congestion.   Respiratory:  Positive for cough and shortness of breath. Negative for wheezing.    Physical Exam  LMP 06/04/2000 (LMP Unknown) Comment: tubal ligation Physical Exam Constitutional:      General: She is not in acute distress.    Appearance: Normal appearance. She is obese. She is not ill-appearing.  HENT:     Head: Normocephalic and atraumatic.  Cardiovascular:     Rate and Rhythm: Normal rate and regular rhythm.  Pulmonary:     Effort: Pulmonary effort is normal.     Breath sounds: Normal breath sounds. No wheezing or rhonchi.     Comments: CTA, diminished; 3L continuous O2  Musculoskeletal:        General: Normal range of motion.  Skin:    General: Skin is warm and dry.  Neurological:     General: No focal deficit present.     Mental Status: She is alert and oriented to person, place, and time. Mental status is at baseline.  Psychiatric:        Mood and Affect: Mood normal.        Behavior: Behavior normal.        Thought Content: Thought content normal.        Judgment: Judgment normal.      Lab Results:  CBC    Component Value Date/Time   WBC 6.5 09/16/2023 0651   RBC 3.79 (L) 09/16/2023 0651   HGB 10.3 (L) 09/16/2023 0651   HGB 12.3 05/10/2020 1004   HCT 31.2 (L) 09/16/2023 0651   HCT 36.9 05/10/2020 1004   PLT 236 09/16/2023 0651   PLT 193 05/10/2020 1004   MCV 82.3 09/16/2023 0651   MCV 86  05/10/2020 1004   MCH 27.2 09/16/2023 0651   MCHC 33.0 09/16/2023 0651   RDW 13.4 09/16/2023 0651   RDW 15.6 (H) 05/10/2020 1004   LYMPHSABS 2.3 07/25/2023 1830   MONOABS 0.6 07/25/2023 1830   EOSABS 0.0 07/25/2023 1830   BASOSABS 0.1 07/25/2023 1830    BMET    Component Value Date/Time   NA 139 09/16/2023 0651   NA 145 (H) 12/19/2022 1126   K 3.9 09/16/2023 0651   CL 100 09/16/2023 0651   CO2 31 09/16/2023 0651   GLUCOSE 124 (H) 09/16/2023 0651   BUN 10 09/16/2023 0651   BUN 10 12/19/2022 1126   CREATININE 0.88 09/16/2023 0651   CALCIUM 9.4 09/16/2023 0651   GFRNONAA >60 09/16/2023 0651   GFRAA 69 05/10/2020 1004    BNP    Component Value Date/Time   BNP 10.8 07/25/2023 1830    ProBNP    Component Value Date/Time   PROBNP <36 06/19/2022 1002   PROBNP 11.0 03/15/2022 0941    Imaging: No results found.   Assessment & Plan:     1. Chronic respiratory failure with hypoxia (HCC) - EKG 12-Lead  2. Chronic obstructive pulmonary  disease, unspecified COPD type (HCC)   Chronic Obstructive Pulmonary Disease (COPD) Patient reports improved symptoms since last flare-up in September. Currently on Trelegy Ellipta, Daliresp, and as-needed albuterol. Expressed interest in retrying Breztri now that she is at her baseline. -Resume trial of Breztri inhaler two puffs q 12 hours  -Continue Daliresp daily.  Frequent COPD exacerbations Patient is on azithromycin (Monday, Wednesday, Friday) to decrease exacerbations and mucus burden. Reports that azithromycin is working well. -Continue azithromycin regimen. -Monitor EKG every 6-12 months due to potential QTc interval prolongation from azithromycin.  Chronic Respiratory Failure Patient is oxygen-dependent, currently on 3 liters of oxygen, including at night. - Continue current oxygen regimen 3-4L to maintain O2 >88-90% . - Patient did not qualify for POC today, she was unable to maintain O2 >88% on 4-5L pulsed oxygen   - DME company is [employer]   Immunizations Patient received RSV vaccine and flu shot this year. Last pneumonia vaccine was in 2018. - Last received pneumonia vaccine in 2018, you are due for PCV20 or PVC21- PLEASE CALL PHARMACY TO SCHEDULE ONE TIME VACCINE   Gastroesophageal Reflux Disease (GERD) Patient reports occasional discomfort, possibly due to acid reflux. Currently on medication for reflux. -Continue current reflux medication.      Glenford Bayley, NP 09/22/2023

## 2023-09-23 ENCOUNTER — Encounter: Payer: Self-pay | Admitting: Primary Care

## 2023-09-23 ENCOUNTER — Ambulatory Visit (INDEPENDENT_AMBULATORY_CARE_PROVIDER_SITE_OTHER): Payer: 59 | Admitting: Primary Care

## 2023-09-23 VITALS — BP 122/76 | HR 72 | Temp 98.8°F | Wt 187.0 lb

## 2023-09-23 DIAGNOSIS — J9611 Chronic respiratory failure with hypoxia: Secondary | ICD-10-CM

## 2023-09-23 DIAGNOSIS — J449 Chronic obstructive pulmonary disease, unspecified: Secondary | ICD-10-CM

## 2023-09-23 MED ORDER — BREZTRI AEROSPHERE 160-9-4.8 MCG/ACT IN AERO: 2.0000 | INHALATION_SPRAY | Freq: Two times a day (BID) | RESPIRATORY_TRACT | Status: DC

## 2023-09-23 NOTE — Patient Instructions (Addendum)
VISIT SUMMARY:  Miss Munson, you visited [place] today for a follow-up on your COPD, chronic cough, chronic respiratory failure, pulmonary nodule, and allergic rhinitis. We discussed your current medications and symptoms, including your recent experience with Breztri and azithromycin. You reported improvements in your breathing and overall condition since your last visit.  YOUR PLAN:  -CHRONIC OBSTRUCTIVE PULMONARY DISEASE (COPD): COPD is a chronic lung disease that makes it hard to breathe. Since your symptoms have improved, we will resume the trial of Breztri inhaler and continue your current medications, including Daliresp daily.  -FREQUENT COPD EXACERBATIONS: COPD exacerbations are episodes where symptoms become worse than usual. You will continue taking azithromycin on Monday, Wednesday, and Friday to help reduce these episodes. We will monitor your heart with an EKG every 6-12 months due to the potential side effects of azithromycin.  -CHRONIC RESPIRATORY FAILURE: Chronic respiratory failure occurs when your lungs can't get enough oxygen into your blood. You will continue using 3 liters of oxygen, including at night.  -IMMUNIZATIONS: You are up to date with your RSV and flu vaccines. You last received pneumonia vaccine in 2018, you are due for PCV20 or PVC21- PLEASE CALL PHARMACY TO SCHEDULE ONE TIME VACCINE   -GASTROESOPHAGEAL REFLUX DISEASE (GERD): GERD is a condition where stomach acid frequently flows back into the tube connecting your mouth and stomach, causing discomfort. You will continue your current medication for reflux.  INSTRUCTIONS: Please follow up with [place] to monitor your heart with an EKG every 6-12 months due to the potential side effects of azithromycin. We will also check if you need a pneumonia vaccine and schedule it if necessary.  Follow-up: 6 months with Dr. Delton Coombes or sooner with Waynetta Sandy NP if needed

## 2023-09-24 NOTE — Telephone Encounter (Signed)
Resolved per patient.  

## 2023-10-23 ENCOUNTER — Telehealth: Payer: Self-pay | Admitting: Emergency Medicine

## 2023-10-23 ENCOUNTER — Telehealth: Payer: Self-pay

## 2023-10-23 ENCOUNTER — Other Ambulatory Visit (HOSPITAL_COMMUNITY): Payer: Self-pay

## 2023-10-23 NOTE — Telephone Encounter (Signed)
30-Day transition fill available for name brand Daliresp for $0.00 co-pay. Insurance now prefers generic and a new encounter will be created for this prior authorization.

## 2023-10-23 NOTE — Telephone Encounter (Signed)
Pharmacy Patient Advocate Encounter   Received notification from Pt Calls Messages that prior authorization for Roflumilast tablets is required/requested.   Insurance verification completed.   The patient is insured through [facility] Medicare Part D.   Per test claim: PA required; PA submitted to above mentioned insurance via CoverMyMeds Key/confirmation #/EOC [id number] Status is pending

## 2023-10-23 NOTE — Telephone Encounter (Signed)
Pt calling in bc she need a prior auth sent to her ins for  roflumilast (DALIRESP) 500 MCG TABS tablet , she switched to medicare on Oct. 1st and they are not paying for her medication

## 2023-10-28 NOTE — Telephone Encounter (Signed)
Pharmacy Patient Advocate Encounter  Received notification from [facility] Medicare Part D that Prior Authorization for Roflumilast tablets has been APPROVED from 10-23-2023 to 11-11-2024   PA #/Case ID/Reference #: [id number]

## 2023-11-20 ENCOUNTER — Other Ambulatory Visit: Payer: Self-pay | Admitting: Emergency Medicine

## 2023-11-23 ENCOUNTER — Other Ambulatory Visit: Payer: Self-pay | Admitting: Cardiology

## 2023-12-15 ENCOUNTER — Other Ambulatory Visit: Payer: Self-pay | Admitting: Emergency Medicine

## 2024-01-18 ENCOUNTER — Other Ambulatory Visit: Payer: Self-pay | Admitting: Emergency Medicine

## 2024-01-27 ENCOUNTER — Other Ambulatory Visit: Payer: Self-pay | Admitting: Emergency Medicine

## 2024-01-27 ENCOUNTER — Other Ambulatory Visit: Payer: 59

## 2024-01-27 DIAGNOSIS — Z87891 Personal history of nicotine dependence: Secondary | ICD-10-CM

## 2024-01-27 DIAGNOSIS — Z122 Encounter for screening for malignant neoplasm of respiratory organs: Secondary | ICD-10-CM

## 2024-02-05 NOTE — Progress Notes (Unsigned)
 Date:  02/06/2024   ID:  Schultz, Stephanie Jul 01, 1956, MRN [id number]  PCP:  Rometta Emery, MD  Cardiologist:  Armanda Magic, MD  Electrophysiologist:  None   Chief Complaint:  CHF  History of Present Illness:    Stephanie Schultz is a 68 y.o. female with a history of  DM2, HTN, hyperlipidemia, COPD with chronic respiratory failure on chronic O2 at 3L, OSA on CPAP, prior tobacco abuse, chronic diastolic CHF and  morbid obesity.  She had a cath in 2009 showing normal coronary arteries and normal LVF.    She had recurrent CP and underwent repeat LHC 12/03/16 with normal coronaries, EF 55-65%, normal LVEDP. Based on symptoms, increased O2 requirements and increased sputum production, she was felt to have an acute COPD exacerbation and was treated with improvement in symptoms.   SHe is here today for followup.  She tells me that she still gets intermittent CP and thinks it may be similar to what she had when she had her cath in 2018.  She says she has had it 2 times in the past 8 months.  It usually lasts 30 minutes and goes away.  It is not really a pain but is a nagging sensation. It feels like a tooth ache. She denies any  PND, orthopnea, dizziness, palpitations or syncope. She has chronic DOE related to COPD but is very stable on 3L [place] 24/7.  She has chronic DOE related to her COPD that is unchanged. She has chronic LLE edema from gout and arthritis. She is compliant with her meds and is tolerating meds with no SE.     Prior CV studies:   The following studies were reviewed today:  None  Past Medical History:  Diagnosis Date   Anemia    chronis   Anxiety    Arthritis    Cervical cancer (HCC) 1990   cervical    Chronic diastolic CHF (congestive heart failure) (HCC) 08/10/2016   COPD (chronic obstructive pulmonary disease) (HCC)    O2 dependent. Pulmo: Dr. Delton Coombes   Diabetes mellitus without complication (HCC)    a. A1c 8.3 in 11/2015   Emphysema    Gallstones     s/p cholecystectomy   GERD (gastroesophageal reflux disease)    Gout    Hx of cardiac catheterization    a. LHC at [facility] in [place], [place] 09/2008:  Normal coronary arteries EF 70%. b. LHC 11/2016: normal cors, normal LVEDP, EF 55-65%.   Hyperlipidemia    Hypertension    Hypoxia 11/17/2018   Morbid obesity (HCC)    On home oxygen therapy    "2.5L; 24/7" (08/10/2016)   OSA on CPAP    CPAP at night    Pneumonia    Sickle cell trait (HCC)    Tobacco abuse    a. up to 3ppd from age 85 to 78, now 1/4 ppd (01/2013) >> Quit 10/2015   Past Surgical History:  Procedure Laterality Date   BREAST BIOPSY Right 02/23/2019   x2   BREAST BIOPSY Left 2014   CARDIAC CATHETERIZATION     CARDIAC CATHETERIZATION N/A 12/03/2016   Procedure: Left Heart Cath and Coronary Angiography;  Surgeon: Peter M Swaziland, MD;  Location: [facility] INVASIVE CV LAB;  Service: Cardiovascular;  Laterality: N/A;   CHOLECYSTECTOMY N/A 12/13/2015   Procedure: LAPAROSCOPIC CHOLECYSTECTOMY;  Surgeon: Axel Filler, MD;  Location: [facility] ORS;  Service: General;  Laterality: N/A;   COLONOSCOPY  09/05/2012   Procedure: COLONOSCOPY;  Surgeon:  Theda Belfast, MD;  Location: [facility];  Service: Endoscopy;  Laterality: N/A;   COLONOSCOPY WITH PROPOFOL N/A 09/02/2015   Procedure: COLONOSCOPY WITH PROPOFOL;  Surgeon: Jeani Hawking, MD;  Location: [facility] ENDOSCOPY;  Service: Endoscopy;  Laterality: N/A;   COLONOSCOPY WITH PROPOFOL N/A 02/15/2023   Procedure: COLONOSCOPY WITH PROPOFOL;  Surgeon: Jeani Hawking, MD;  Location: [facility];  Service: Gastroenterology;  Laterality: N/A;   DILATATION & CURETTAGE/HYSTEROSCOPY WITH MYOSURE N/A 09/16/2023   Procedure: DILATATION & CURETTAGE/HYSTEROSCOPY WITH MYOSURE LITE;  Surgeon: Edwinna Areola, DO;  Location: [facility] OR;  Service: Gynecology;  Laterality: N/A;   ESOPHAGOGASTRODUODENOSCOPY (EGD) WITH PROPOFOL N/A 03/29/2017   Procedure: ESOPHAGOGASTRODUODENOSCOPY (EGD) WITH PROPOFOL;  Surgeon: Jeani Hawking,  MD;  Location: [facility] ENDOS[facility];  Service: Endoscopy;  Laterality: N/A;   POLYPECTOMY  02/15/2023   Procedure: POLYPECTOMY;  Surgeon: Jeani Hawking, MD;  Location: [facility];  Service: Gastroenterology;;   TUBAL LIGATION       Current Meds  Medication Sig   albuterol (PROVENTIL) (2.5 MG/3ML) 0.083% nebulizer solution USE 1 VIAL VIA NEBULIZER 4 TIMES DAILY AS NEEDED.   albuterol (VENTOLIN HFA) 108 (90 Base) MCG/ACT inhaler Inhale 2 puffs into the lungs every 4 (four) hours as needed for wheezing or shortness of breath.   allopurinol (ZYLOPRIM) 100 MG tablet Take 100 mg by mouth daily.   ALPRAZolam (XANAX) 1 MG tablet Take 1 mg by mouth daily as needed for anxiety.   aspirin EC 81 MG tablet Take 81 mg by mouth daily.   azelastine (ASTELIN) 0.1 % nasal spray Place 2 sprays into both nostrils 2 (two) times daily. Use in each nostril as directed   azithromycin (ZITHROMAX) 250 MG tablet Take 1 tablet (250 mg total) by mouth every Monday, Wednesday, and Friday.   cetirizine (ZYRTEC) 10 MG tablet Take 10 mg by mouth daily.   Cholecalciferol (VITAMIN D PO) Take 20 mcg by mouth daily. 10 mcg each   clobetasol cream (TEMOVATE) 0.05 % Apply 1 application topically daily as needed (irritation from pull ups).    ferrous sulfate 324 MG TBEC Take 324 mg by mouth 2 (two) times a week.   furosemide (LASIX) 40 MG tablet Take 40 mg (1 tablet) by mouth in the morning and 20 mg (1/2 tablet) by mouth in the evening   gabapentin (NEURONTIN) 300 MG capsule Take 300 mg by mouth 3 (three) times daily as needed (Pain).   HYDROcodone-acetaminophen (NORCO) 10-325 MG tablet Take 1 tablet by mouth every 4 (four) hours as needed for moderate pain (pain score 4-6).   lubiprostone (AMITIZA) 24 MCG capsule Take 24 mcg by mouth 2 (two) times daily.   metoprolol succinate (TOPROL-XL) 50 MG 24 hr tablet TAKE ONE TABLET BY MOUTH EVERY MORNING FOR BLOOD PRESSURE   OXYGEN Inhale 3 L into the lungs continuous.   OZEMPIC, 2 MG/DOSE, 8  MG/3ML SOPN Inject 2 mg into the skin once a week.   pantoprazole (PROTONIX) 40 MG tablet Take 1 tablet (40 mg total) by mouth 2 (two) times daily.   potassium chloride SA (KLOR-CON M) 20 MEQ tablet TAKE ONE TABLET BY MOUTH EVERY MORNING   Respiratory Therapy Supplies (FLUTTER) DEVI 1 Device by Does not apply route 3 (three) times daily.   roflumilast (DALIRESP) 500 MCG TABS tablet TAKE ONE TABLET BY MOUTH EVERY MORNING   spironolactone (ALDACTONE) 25 MG tablet Take 0.5 tablets (12.5 mg total) by mouth daily.   Tetrahydroz-Dextran-PEG-Povid (ADVANCED FORMULA EYE DROPS OP) Place 1-2 drops into  both eyes daily as needed (Dry eyes).   TRELEGY ELLIPTA 200-62.5-25 MCG/ACT AEPB INHALE 1 PUFF BY MOUTH EVERY DAY   zolpidem (AMBIEN) 10 MG tablet Take 10 mg by mouth at bedtime.     Allergies:   Patient has no active allergies.   Social History   Tobacco Use   Smoking status: Former    Current packs/day: 0.00    Average packs/day: 3.0 packs/day for 36.0 years (108.0 ttl pk-yrs)    Types: Cigarettes    Start date: 11/11/1979    Quit date: 11/11/2015    Years since quitting: 8.2   Smokeless tobacco: Never   Tobacco comments:    Approx 90 pk-yrs (up to 3ppd until ~ 2009).  Vaping Use   Vaping status: Never Used  Substance Use Topics   Alcohol use: No    Alcohol/week: 0.0 standard drinks of alcohol   Drug use: No     Family Hx: The patient's family history includes Diabetes in her mother; Lung cancer in her paternal aunt and paternal grandfather; Myasthenia gravis in her mother; Other in her father and another family member. There is no history of Heart attack, Heart failure, or Breast cancer.  ROS:   Please see the history of present illness.     All other systems reviewed and are negative.   Labs/Other Tests and Data Reviewed:    Recent Labs: 07/25/2023: B Natriuretic Peptide 10.8 09/16/2023: BUN 10; Creatinine, Ser 0.88; Hemoglobin 10.3; Platelets 236; Potassium 3.9; Sodium 139    Recent Lipid Panel Lab Results  Component Value Date/Time   CHOL 119 12/01/2016 02:05 AM   TRIG 212 (H) 12/01/2016 02:05 AM   HDL 34 (L) 12/01/2016 02:05 AM   CHOLHDL 3.5 12/01/2016 02:05 AM   LDLCALC 43 12/01/2016 02:05 AM    Wt Readings from Last 3 Encounters:  02/06/24 167 lb 3.2 oz (75.8 kg)  09/23/23 187 lb (84.8 kg)  09/16/23 185 lb (83.9 kg)     Objective:    Vital Signs:  BP 114/70   Pulse 76   Ht 5' (1.524 m)   Wt 167 lb 3.2 oz (75.8 kg)   LMP 06/04/2000 (LMP Unknown) Comment: tubal ligation  SpO2 99%   BMI 32.65 kg/m    GEN: Well nourished, well developed in no acute distress HEENT: Normal NECK: No JVD; No carotid bruits LYMPHATICS: No lymphadenopathy CARDIAC:RRR, no murmurs, rubs, gallops RESPIRATORY: decreased BS throughout ABDOMEN: Soft, non-tender, non-distended MUSCULOSKELETAL:  No edema; No deformity  SKIN: Warm and dry NEUROLOGIC:  Alert and oriented x 3 PSYCHIATRIC:  Normal affect    EKG was performed in the office today and demonstrates NSR with no ST changes ASSESSMENT & PLAN:    1.   Chronic diastolic CHF -she has chronic DOE from COPD and is on chronic 3L O2 [place]>> breathing is stable -She does not appear volume overloaded on exam today -Continue prescription drug management with Lasix 40 mg every morning and 20 mg every afternoon, Toprol XL 50 mg daily and spironolactone 12.5 mg daily with as needed refills -I have personally reviewed and interpreted outside labs performed by patient's PCP which showed serum creatinine 0.88 and potassium 3.9 on  09/16/2023 -she complains of some puffiness under her eyes in the am>>I will check a BNP and TSH  2.  Hypertension - BP controlled on exam today -Continue prescription drug management with spironolactone 12.5 mg daily, Toprol XL 50 mg daily with as needed refills  3.  Atypical Chest pain -  she has had this for year and had a normal cath in 2009 and 2018 -she has had 2 episodes of CP in the past 8  months -it can last up to 30 minutes with no associated sx of nausea or diaphoresis and no radiation and feels like a tooth ache.  -I will get a coronary CTA to define coronary anatomy  Medication Adjustments/Labs and Tests Ordered: Current medicines are reviewed at length with the patient today.  Concerns regarding medicines are outlined above.  Tests Ordered: No orders of the defined types were placed in this encounter.   Medication Changes: No orders of the defined types were placed in this encounter.    Disposition:  Follow up in 1 year  Signed, Armanda Magic, MD  02/06/2024 10:23 AM    [facility]

## 2024-02-06 ENCOUNTER — Other Ambulatory Visit: Payer: Self-pay

## 2024-02-06 ENCOUNTER — Encounter: Payer: Self-pay | Admitting: Cardiology

## 2024-02-06 ENCOUNTER — Ambulatory Visit: Payer: 59 | Attending: Cardiology | Admitting: Cardiology

## 2024-02-06 VITALS — BP 114/70 | HR 76 | Ht 60.0 in | Wt 167.2 lb

## 2024-02-06 DIAGNOSIS — I5032 Chronic diastolic (congestive) heart failure: Secondary | ICD-10-CM

## 2024-02-06 DIAGNOSIS — I10 Essential (primary) hypertension: Secondary | ICD-10-CM

## 2024-02-06 DIAGNOSIS — R072 Precordial pain: Secondary | ICD-10-CM

## 2024-02-06 NOTE — Patient Instructions (Signed)
 Medication Instructions:  Your physician recommends that you continue on your current medications as directed. Please refer to the Current Medication list given to you today.  *If you need a refill on your cardiac medications before your next appointment, please call your pharmacy*  Lab Work: TODAY: TSH and BNP If you have labs (blood work) drawn today and your tests are completely normal, you will receive your results only by: MyChart Message (if you have MyChart) OR A paper copy in the mail If you have any lab test that is abnormal or we need to change your treatment, we will call you to review the results.  Testing/Procedures: Coronary CTA Your physician has requested that you have cardiac CT. Cardiac computed tomography (CT) is a painless test that uses an x-ray machine to take clear, detailed pictures of your heart. For further information please visit [email or web address] Please follow instruction sheet as given.   Follow-Up: At [facility], you and your health needs are our priority.  As part of our continuing mission to provide you with exceptional heart care, we have created designated Provider Care Teams.  These Care Teams include your primary Cardiologist (physician) and Advanced Practice Providers (APPs -  Physician Assistants and Nurse Practitioners) who all work together to provide you with the care you need, when you need it.  Your next appointment:   1 year  Provider:   Armanda Magic, MD

## 2024-02-06 NOTE — Addendum Note (Signed)
 Addended by: Frutoso Schatz on: 02/06/2024 10:33 AM   Modules accepted: Orders

## 2024-02-07 LAB — PRO B NATRIURETIC PEPTIDE: NT-Pro BNP: <36 pg/mL (ref 0–301)

## 2024-02-07 LAB — TSH: TSH: 0.5 u[IU]/mL (ref 0.450–4.500)

## 2024-02-11 DIAGNOSIS — I2510 Atherosclerotic heart disease of native coronary artery without angina pectoris: Secondary | ICD-10-CM

## 2024-02-11 HISTORY — DX: Atherosclerotic heart disease of native coronary artery without angina pectoris: I25.10

## 2024-02-12 ENCOUNTER — Telehealth: Payer: Self-pay | Admitting: Cardiology

## 2024-02-12 ENCOUNTER — Ambulatory Visit
Admission: RE | Admit: 2024-02-12 | Discharge: 2024-02-12 | Disposition: A | Discharge status: Home / Self Care | Source: Ambulatory Visit | Attending: Acute Care | Admitting: Acute Care

## 2024-02-12 DIAGNOSIS — Z122 Encounter for screening for malignant neoplasm of respiratory organs: Secondary | ICD-10-CM

## 2024-02-12 DIAGNOSIS — Z87891 Personal history of nicotine dependence: Secondary | ICD-10-CM

## 2024-02-12 NOTE — Telephone Encounter (Signed)
See lab result for documentation. 

## 2024-02-12 NOTE — Telephone Encounter (Signed)
Patient returning call for results. Please advise  

## 2024-02-17 ENCOUNTER — Telehealth (HOSPITAL_COMMUNITY): Payer: Self-pay | Admitting: *Deleted

## 2024-02-17 MED ORDER — METOPROLOL TARTRATE 100 MG PO TABS: ORAL_TABLET | ORAL | 0 refills | Status: AC

## 2024-02-17 NOTE — Telephone Encounter (Signed)
 Reaching out to patient to offer assistance regarding upcoming cardiac imaging study; pt verbalizes understanding of appt date/time, parking situation and where to check in, pre-test NPO status and medications ordered, and verified current allergies; name and call back number provided for further questions should they arise Johney Frame RN Navigator Cardiac Imaging [facility] [phone] office [phone] cell  Patient to take 100mg  metoprolol tartrate two hours prior to test, hold daily metoprolol succinate (per Dr. Mayford Knife).

## 2024-02-18 ENCOUNTER — Ambulatory Visit (HOSPITAL_COMMUNITY)
Admission: RE | Admit: 2024-02-18 | Discharge: 2024-02-18 | Disposition: A | Discharge status: Home / Self Care | Source: Ambulatory Visit | Attending: Cardiology | Admitting: Cardiology

## 2024-02-18 DIAGNOSIS — R072 Precordial pain: Secondary | ICD-10-CM | POA: Diagnosis not present

## 2024-02-18 MED ORDER — SODIUM CHLORIDE 0.9 % IV BOLUS
500.0000 mL | Freq: Once | INTRAVENOUS | Status: AC
  Administered 2024-02-18: 500 mL via INTRAVENOUS

## 2024-02-18 MED ORDER — IOHEXOL 350 MG/ML SOLN
100.0000 mL | Freq: Once | INTRAVENOUS | Status: AC | PRN
  Administered 2024-02-18: 100 mL via INTRAVENOUS

## 2024-02-18 MED ORDER — NITROGLYCERIN 0.4 MG SL SUBL
0.8000 mg | SUBLINGUAL_TABLET | Freq: Once | SUBLINGUAL | Status: AC
  Administered 2024-02-18: 0.8 mg via SUBLINGUAL

## 2024-02-18 MED ORDER — NITROGLYCERIN 0.4 MG SL SUBL
SUBLINGUAL_TABLET | SUBLINGUAL | Status: AC
  Filled 2024-02-18: qty 2

## 2024-02-18 MED ORDER — DILTIAZEM HCL 25 MG/5ML IV SOLN
10.0000 mg | INTRAVENOUS | Status: DC | PRN
Start: 2024-02-18 — End: 2024-02-19

## 2024-02-19 ENCOUNTER — Encounter: Payer: Self-pay | Admitting: Cardiology

## 2024-02-19 LAB — POCT I-STAT CREATININE: Creatinine, Ser: 1.1 mg/dL — ABNORMAL HIGH (ref 0.44–1.00)

## 2024-02-26 ENCOUNTER — Telehealth: Payer: Self-pay

## 2024-02-26 DIAGNOSIS — Z79899 Other long term (current) drug therapy: Secondary | ICD-10-CM

## 2024-02-26 NOTE — Telephone Encounter (Signed)
 Patient notified of results. Labs ordered and released. Scan forwarded to PCP. Pt aware. Pt verbalized understanding. All questions if any were answered.

## 2024-02-26 NOTE — Telephone Encounter (Signed)
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

## 2024-02-28 ENCOUNTER — Other Ambulatory Visit: Payer: Self-pay | Admitting: Primary Care

## 2024-02-28 NOTE — Telephone Encounter (Signed)
 Last Fill: 08/07/23  Last OV: 09/23/23 Next OV: 03/24/24  Routing to provider for review/authorization.

## 2024-02-28 NOTE — Telephone Encounter (Signed)
 Copied from CRM [id number]. Topic: Clinical - Medication Refill >> Feb 28, 2024 12:01 PM Celestine FALCON wrote: Most Recent Primary Care Visit:   Medication: azithromycin  (ZITHROMAX ) 250 MG tablet   Has the patient contacted their pharmacy? Yes; pharmacy said they've sent multiple fax orders to provider. No refills.  (Agent: If no, request that the patient contact the pharmacy for the refill. If patient does not wish to contact the pharmacy document the reason why and proceed with request.) (Agent: If yes, when and what did the pharmacy advise?)  Is this the correct pharmacy for this prescription? Yes If no, delete pharmacy and type the correct one.  This is the patient's preferred pharmacy:  [facility] - [place] [address] [place] [address] Phone: [phone] Fax: [phone]   Has the prescription been filled recently? No  Is the patient out of the medication? Yes  Has the patient been seen for an appointment in the last year OR does the patient have an upcoming appointment? Yes  Can we respond through MyChart? Yes  Agent: Please be advised that Rx refills may take up to 3 business days. We ask that you follow-up with your pharmacy.

## 2024-03-02 MED ORDER — AZITHROMYCIN 250 MG PO TABS: 250.0000 mg | ORAL_TABLET | ORAL | 5 refills | Status: DC

## 2024-03-02 NOTE — Telephone Encounter (Signed)
 Refill request I dont know how she does her abx refill since its been a while since pt has been seen

## 2024-03-04 ENCOUNTER — Ambulatory Visit: Payer: Self-pay

## 2024-03-04 ENCOUNTER — Ambulatory Visit: Admitting: Pulmonary Disease

## 2024-03-04 ENCOUNTER — Encounter: Payer: Self-pay | Admitting: Pulmonary Disease

## 2024-03-04 VITALS — BP 122/68 | HR 90 | Temp 98.6°F | Ht 60.0 in | Wt 164.6 lb

## 2024-03-04 DIAGNOSIS — J9610 Chronic respiratory failure, unspecified whether with hypoxia or hypercapnia: Secondary | ICD-10-CM | POA: Diagnosis not present

## 2024-03-04 DIAGNOSIS — Z87891 Personal history of nicotine dependence: Secondary | ICD-10-CM

## 2024-03-04 DIAGNOSIS — R0602 Shortness of breath: Secondary | ICD-10-CM

## 2024-03-04 DIAGNOSIS — J019 Acute sinusitis, unspecified: Secondary | ICD-10-CM | POA: Diagnosis not present

## 2024-03-04 DIAGNOSIS — Z9981 Dependence on supplemental oxygen: Secondary | ICD-10-CM

## 2024-03-04 DIAGNOSIS — J309 Allergic rhinitis, unspecified: Secondary | ICD-10-CM

## 2024-03-04 DIAGNOSIS — J441 Chronic obstructive pulmonary disease with (acute) exacerbation: Secondary | ICD-10-CM

## 2024-03-04 MED ORDER — PREDNISONE 20 MG PO TABS: 20.0000 mg | ORAL_TABLET | Freq: Every day | ORAL | 0 refills | Status: DC

## 2024-03-04 MED ORDER — AMOXICILLIN-POT CLAVULANATE 875-125 MG PO TABS: 1.0000 | ORAL_TABLET | Freq: Two times a day (BID) | ORAL | 0 refills | Status: AC

## 2024-03-04 MED ORDER — AZITHROMYCIN 250 MG PO TABS: 250.0000 mg | ORAL_TABLET | ORAL | 6 refills | Status: DC

## 2024-03-04 NOTE — Telephone Encounter (Signed)
 Copied from CRM [id number]. Topic: Clinical - Red Word Triage >> Mar 04, 2024 12:08 PM Ilene Malling wrote: Red Word that prompted transfer to Nurse Triage: Patient [phone] wants to be seen today due to breathing issues, shortness of breath, dizziness, coughing, upper left side back pain, no fever. Please advise.   E2C2 Pulmonary Triage - Initial Assessment Questions "Chief Complaint (e.g., cough, sob, wheezing, fever, chills, sweat or additional symptoms) *Go to specific symptom protocol after initial questions. Cough  "How long have symptoms been present?" 2 weeks   Have you tested for COVID or Flu? Note: If not, ask patient if a home test can be taken. If so, instruct patient to call back for positive results. Yes, negative   MEDICINES:   "Have you used any OTC meds to help with symptoms?" Yes If yes, ask "What medications?" Robitussin   "Have you used your inhalers/maintenance medication?" Yes If yes, "What medications?" Breathing treatments   If inhaler, ask "How many puffs and how often?" Note: Review instructions on medication in the chart. N/A  OXYGEN : "Do you wear supplemental oxygen ?" Yes If yes, "How many liters are you supposed to use?" 3L  "Do you monitor your oxygen  levels?" Yes If yes, "What is your reading (oxygen  level) today?" 93%  "What is your usual oxygen  saturation reading?"  (Note: Pulmonary O2 sats should be 90% or greater) 93-95%  Reason for Disposition  [1] MILD difficulty breathing (e.g., minimal/no SOB at rest, SOB with walking, pulse <100) AND [2] still present when not coughing  Answer Assessment - Initial Assessment Questions 1. ONSET: "When did the cough begin?"      2 weeks 2. SEVERITY: "How bad is the cough today?"      Moderate  3. SPUTUM: "Describe the color of your sputum" (none, dry cough; clear, white, yellow, green)     White 4. HEMOPTYSIS: "Are you coughing up any blood?" If so ask: "How much?" (flecks, streaks, tablespoons,  etc.)     No 5. DIFFICULTY BREATHING: "Are you having difficulty breathing?" If Yes, ask: "How bad is it?" (e.g., mild, moderate, severe)    - MILD: No SOB at rest, mild SOB with walking, speaks normally in sentences, can lie down, no retractions, pulse < 100.    - MODERATE: SOB at rest, SOB with minimal exertion and prefers to sit, cannot lie down flat, speaks in phrases, mild retractions, audible wheezing, pulse 100-120.    - SEVERE: Very SOB at rest, speaks in single words, struggling to breathe, sitting hunched forward, retractions, pulse > 120      Mild to Moderate 6. FEVER: "Do you have a fever?" If Yes, ask: "What is your temperature, how was it measured, and when did it start?"     No 7. CARDIAC HISTORY: "Do you have any history of heart disease?" (e.g., heart attack, congestive heart failure)      CHF 8. LUNG HISTORY: "Do you have any history of lung disease?"  (e.g., pulmonary embolus, asthma, emphysema)     Yes  9. PE RISK FACTORS: "Do you have a history of blood clots?" (or: recent major surgery, recent prolonged travel, bedridden)     No 10. OTHER SYMPTOMS: "Do you have any other symptoms?" (e.g., runny nose, wheezing, chest pain)       Nasal congestion  Protocols used: Cough - Acute Productive-A-AH

## 2024-03-04 NOTE — Patient Instructions (Signed)
 Prescription for azithromycin  to be used 3 days a week  Prescription for Augmentin  sent to pharmacy  Prescription for prednisone  to be used for about 7 days if needed  You may keep your appointment in May but if you are feeling better at that time it can be rescheduled for 3 months from here Just make sure you call to cancel

## 2024-03-04 NOTE — Progress Notes (Signed)
 Stephanie Schultz    [id number]    1955/11/30  Primary Care Physician:Garba, Betsy Brown, MD  Referring Physician: Davida Espy, MD [address] [place],  [place] [address]  Chief complaint:   Patient being seen for an acute visit  HPI:  Patient with a history of COPD Increased cough, congestion Nasal stuffiness and congestion which she feels may be related to allergies but bringing up more mucus, nasal stuffiness Has not had any fevers or chills No night sweats  She feels symptoms are worsening  She is on oxygen  around-the-clock as also noted some desaturations with activity  She is on Trelegy which she is compliant with  She recently saw Irby Mannan in November and has an appointment coming up in about a month  Outpatient Encounter Medications as of 03/04/2024  Medication Sig   albuterol  (PROVENTIL ) (2.5 MG/3ML) 0.083% nebulizer solution USE 1 VIAL VIA NEBULIZER 4 TIMES DAILY AS NEEDED.   albuterol  (VENTOLIN  HFA) 108 (90 Base) MCG/ACT inhaler Inhale 2 puffs into the lungs every 4 (four) hours as needed for wheezing or shortness of breath.   allopurinol  (ZYLOPRIM ) 100 MG tablet Take 100 mg by mouth daily.   ALPRAZolam  (XANAX ) 1 MG tablet Take 1 mg by mouth daily as needed for anxiety.   aspirin  EC 81 MG tablet Take 81 mg by mouth daily.   azelastine  (ASTELIN ) 0.1 % nasal spray Place 2 sprays into both nostrils 2 (two) times daily. Use in each nostril as directed   azithromycin  (ZITHROMAX ) 250 MG tablet Take 1 tablet (250 mg total) by mouth every Monday, Wednesday, and Friday.   cetirizine (ZYRTEC) 10 MG tablet Take 10 mg by mouth daily.   Cholecalciferol  (VITAMIN D  PO) Take 20 mcg by mouth daily. 10 mcg each   clobetasol  cream (TEMOVATE ) 0.05 % Apply 1 application topically daily as needed (irritation from pull ups).    ferrous sulfate 324 MG TBEC Take 324 mg by mouth 2 (two) times a week.   furosemide  (LASIX ) 40 MG tablet Take 40 mg (1 tablet) by mouth  in the morning and 20 mg (1/2 tablet) by mouth in the evening   gabapentin  (NEURONTIN ) 300 MG capsule Take 300 mg by mouth 3 (three) times daily as needed (Pain).   HYDROcodone -acetaminophen  (NORCO) 10-325 MG tablet Take 1 tablet by mouth every 4 (four) hours as needed for moderate pain (pain score 4-6).   lubiprostone  (AMITIZA ) 24 MCG capsule Take 24 mcg by mouth 2 (two) times daily.   metoprolol  succinate (TOPROL -XL) 50 MG 24 hr tablet TAKE ONE TABLET BY MOUTH EVERY MORNING FOR BLOOD PRESSURE   metoprolol  tartrate (LOPRESSOR ) 100 MG tablet Take 1 tablet (100mg ) TWO hours prior to CT scan   OXYGEN  Inhale 3 L into the lungs continuous.   OZEMPIC, 2 MG/DOSE, 8 MG/3ML SOPN Inject 2 mg into the skin once a week.   pantoprazole  (PROTONIX ) 40 MG tablet Take 1 tablet (40 mg total) by mouth 2 (two) times daily.   potassium chloride  SA (KLOR-CON  M) 20 MEQ tablet TAKE ONE TABLET BY MOUTH EVERY MORNING   Respiratory Therapy Supplies (FLUTTER) DEVI 1 Device by Does not apply route 3 (three) times daily.   roflumilast  (DALIRESP ) 500 MCG TABS tablet TAKE ONE TABLET BY MOUTH EVERY MORNING   spironolactone  (ALDACTONE ) 25 MG tablet Take 0.5 tablets (12.5 mg total) by mouth daily.   Tetrahydroz-Dextran-PEG-Povid (ADVANCED FORMULA EYE DROPS OP) Place 1-2 drops into both eyes daily as  needed (Dry eyes).   TRELEGY ELLIPTA  200-62.5-25 MCG/ACT AEPB INHALE 1 PUFF BY MOUTH EVERY DAY   zolpidem  (AMBIEN ) 10 MG tablet Take 10 mg by mouth at bedtime.   No facility-administered encounter medications on file as of 03/04/2024.    Allergies as of 03/04/2024   (No Known Allergies)    Past Medical History:  Diagnosis Date   Anemia    chronis   Anxiety    Arthritis    CAD (coronary artery disease), native coronary artery 02/2024   Coronary CTA showed coronary Ca score of 57 and <25% stenosis of prox-mid RCA and LAD and prox LCx   Cervical cancer (HCC) 1990   cervical    Chronic diastolic CHF (congestive heart failure)  (HCC) 08/10/2016   COPD (chronic obstructive pulmonary disease) (HCC)    O2 dependent. Pulmo: Dr. Baldwin Levee   Diabetes mellitus without complication (HCC)    a. A1c 8.3 in 11/2015   Emphysema    Gallstones    s/p cholecystectomy   GERD (gastroesophageal reflux disease)    Gout    Hx of cardiac catheterization    a. LHC at [facility] in [place] , [place] 09/2008:  Normal coronary arteries EF 70%. b. LHC 11/2016: normal cors, normal LVEDP, EF 55-65%.   Hyperlipidemia    Hypertension    Hypoxia 11/17/2018   Morbid obesity (HCC)    On home oxygen  therapy    "2.5L; 24/7" (08/10/2016)   OSA on CPAP    CPAP at night    Pneumonia    Sickle cell trait (HCC)    Tobacco abuse    a. up to 3ppd from age 46 to 60, now 1/4 ppd (01/2013) >> Quit 10/2015    Past Surgical History:  Procedure Laterality Date   BREAST BIOPSY Right 02/23/2019   x2   BREAST BIOPSY Left 2014   CARDIAC CATHETERIZATION     CARDIAC CATHETERIZATION N/A 12/03/2016   Procedure: Left Heart Cath and Coronary Angiography;  Surgeon: Peter M Swaziland, MD;  Location: [facility] INVASIVE CV LAB;  Service: Cardiovascular;  Laterality: N/A;   CHOLECYSTECTOMY N/A 12/13/2015   Procedure: LAPAROSCOPIC CHOLECYSTECTOMY;  Surgeon: Shela Derby, MD;  Location: [facility] ORS;  Service: General;  Laterality: N/A;   COLONOSCOPY  09/05/2012   Procedure: COLONOSCOPY;  Surgeon: Almeda Aris, MD;  Location: [facility] ENDOSCOPY;  Service: Endoscopy;  Laterality: N/A;   COLONOSCOPY WITH PROPOFOL  N/A 09/02/2015   Procedure: COLONOSCOPY WITH PROPOFOL ;  Surgeon: Alvis Jourdain, MD;  Location: [facility] ENDOSCOPY;  Service: Endoscopy;  Laterality: N/A;   COLONOSCOPY WITH PROPOFOL  N/A 02/15/2023   Procedure: COLONOSCOPY WITH PROPOFOL ;  Surgeon: Alvis Jourdain, MD;  Location: [facility] ENDOSCOPY;  Service: Gastroenterology;  Laterality: N/A;   DILATATION & CURETTAGE/HYSTEROSCOPY WITH MYOSURE N/A 09/16/2023   Procedure: DILATATION & CURETTAGE/HYSTEROSCOPY WITH MYOSURE LITE;  Surgeon: Loa Riling,  DO;  Location: [facility] OR;  Service: Gynecology;  Laterality: N/A;   ESOPHAGOGASTRODUODENOSCOPY (EGD) WITH PROPOFOL  N/A 03/29/2017   Procedure: ESOPHAGOGASTRODUODENOSCOPY (EGD) WITH PROPOFOL ;  Surgeon: Alvis Jourdain, MD;  Location: [facility] ENDOSCOPY;  Service: Endoscopy;  Laterality: N/A;   POLYPECTOMY  02/15/2023   Procedure: POLYPECTOMY;  Surgeon: Alvis Jourdain, MD;  Location: [facility];  Service: Gastroenterology;;   TUBAL LIGATION      Family History  Problem Relation Age of Onset   Other Father        unaware of father's medical history   Diabetes Mother        alive @ 20   Myasthenia gravis Mother  Lung cancer Paternal Aunt    Lung cancer Paternal Grandfather    Other Other        multiple siblings a&w.   Heart attack Neg Hx    Heart failure Neg Hx    Breast cancer Neg Hx     Social History   Socioeconomic History   Marital status: Divorced    Spouse name: Not on file   Number of children: 3   Years of education: 11th   Highest education level: Not on file  Occupational History   Occupation: Disabled    Comment: Emphysema  Tobacco Use   Smoking status: Former    Current packs/day: 0.00    Average packs/day: 3.0 packs/day for 36.0 years (108.0 ttl pk-yrs)    Types: Cigarettes    Start date: 11/11/1979    Quit date: 11/11/2015    Years since quitting: 8.3   Smokeless tobacco: Never   Tobacco comments:    Approx 90 pk-yrs (up to 3ppd until ~ 2009).  Vaping Use   Vaping status: Never Used  Substance and Sexual Activity   Alcohol use: No    Alcohol/week: 0.0 standard drinks of alcohol   Drug use: No   Sexual activity: Yes  Other Topics Concern   Not on file  Social History Narrative   From [place], [place].  Moved to [place] about 8 years ago to be closer to her daughter. She moved back to [place] about a year and a half ago. She lives by herself. She does not routinely exercise or adhere to any particular diet.   Caffeine Use: 1-2 cups daily   Disabled;  Previously in home care   Social Drivers of Health   Financial Resource Strain: Low Risk  (06/29/2023)   Received from [facility]   Overall Financial Resource Strain (CARDIA)    Difficulty of Paying Living Expenses: Not very hard  Food Insecurity: No Food Insecurity (06/29/2023)   Received from [facility]   Hunger Vital Sign    Worried About Running Out of Food in the Last Year: Never true    Ran Out of Food in the Last Year: Never true  Transportation Needs: No Transportation Needs (06/29/2023)   Received from [facility] - Transportation    Lack of Transportation (Medical): No    Lack of Transportation (Non-Medical): No  Physical Activity: Not on file  Stress: No Stress Concern Present (06/29/2023)   Received from [facility] - Occupational Stress Questionnaire    Feeling of Stress : Only a little  Social Connections: Moderately Isolated (06/29/2023)   Received from [facility]   Social Connection and Isolation Panel [NHANES]    Frequency of Communication with Friends and Family: More than three times a week    Frequency of Social Gatherings with Friends and Family: More than three times a week    Attends Religious Services: More than 4 times per year    Active Member of [employer] or Organizations: No    Attends Banker Meetings: Never    Marital Status: Divorced  Catering manager Violence: Not At Risk (06/29/2023)   Received from [facility]   Humiliation, Afraid, Rape, and Kick questionnaire    Fear of Current or Ex-Partner: No    Emotionally Abused: No    Physically Abused: No    Sexually Abused: No    Review of Systems  Constitutional:  Negative  for fever.  Respiratory:  Positive for cough and shortness of breath.     Vitals:   03/04/24 1407  BP: 122/68  Pulse: 90  Temp: 98.6 F (37 C)  SpO2: 98%     Physical Exam Constitutional:      Appearance: Normal appearance.  HENT:      Head: Normocephalic.     Mouth/Throat:     Mouth: Mucous membranes are moist.  Eyes:     General: No scleral icterus. Cardiovascular:     Rate and Rhythm: Normal rate and regular rhythm.     Heart sounds: No murmur heard.    No friction rub.  Pulmonary:     Effort: No respiratory distress.     Breath sounds: No stridor. No wheezing or rhonchi.  Musculoskeletal:     Cervical back: No rigidity or tenderness.  Neurological:     Mental Status: She is alert.  Psychiatric:        Mood and Affect: Mood normal.    Data Reviewed: Low-dose CT from 02/12/2024 reviewed showing no significant nodularity, some scarring at the base worse at the left base  PFT from 3 years ago reviewed showing FEV1 of 37%, stable from 5 years ago, diffusing capacity of 31%   Assessment:  COPD with exacerbation  Exacerbation of allergies  Chronic respiratory failure  Acute sinusitis/bronchitis  Chronic respiratory failure on oxygen  supplementation - Continue oxygen  supplementation  Plan/Recommendations: Will call in prescription for azithromycin  to continue using it 3 days a week  Prescription for Augmentin  to treat sinusitis and bronchitis  Short course of steroids  Encouraged to keep appointment in May if not feeling better otherwise to reschedule for about 3 months  Avoid known triggers as possible  Encouraged to give us  a call with any significant concerns   Myer Artis MD [facility] 03/04/2024, 2:18 PM  CC: Davida Espy, MD

## 2024-03-13 ENCOUNTER — Other Ambulatory Visit: Payer: Self-pay | Admitting: Acute Care

## 2024-03-13 DIAGNOSIS — Z87891 Personal history of nicotine dependence: Secondary | ICD-10-CM

## 2024-03-13 DIAGNOSIS — Z122 Encounter for screening for malignant neoplasm of respiratory organs: Secondary | ICD-10-CM

## 2024-03-23 NOTE — Progress Notes (Unsigned)
 @Patient  ID: Stephanie Schultz, female    DOB: Jul 07, 1956, 68 y.o.   MRN: [id number]  No chief complaint on file.   Referring provider: Davida Espy, MD  HPI:   Patient with a history of COPD Increased cough, congestion Nasal stuffiness and congestion which she feels may be related to allergies but bringing up more mucus, nasal stuffiness Has not had any fevers or chills No night sweats  She feels symptoms are worsening  She is on oxygen  around-the-clock as also noted some desaturations with activity  She is on Trelegy which she is compliant with  She recently saw Irby Mannan in November and has an appointment coming up in about a month   Assessment:  COPD with exacerbation  Exacerbation of allergies  Chronic respiratory failure  Acute sinusitis/bronchitis  Chronic respiratory failure on oxygen  supplementation - Continue oxygen  supplementation  Plan/Recommendations: Will call in prescription for azithromycin  to continue using it 3 days a week  Prescription for Augmentin  to treat sinusitis and bronchitis  Short course of steroids  Encouraged to keep appointment in May if not feeling better otherwise to reschedule for about 3 months  Avoid known triggers as possible  Encouraged to give us  a call with any significant concerns        No Known Allergies  Immunization History  Administered Date(s) Administered   Influenza Split 09/20/2011, 09/12/2012, 08/12/2013   Influenza, Seasonal, Injecte, Preservative Fre 08/07/2023   Influenza,inj,Quad PF,6+ Mos 08/03/2014, 08/12/2015, 08/12/2017, 07/29/2018, 09/11/2021   Influenza,inj,Quad PF,6-35 Mos 09/11/2019   PFIZER(Purple Top)SARS-COV-2 Vaccination 02/05/2020, 02/25/2020, 11/08/2020   Pneumococcal Conjugate-13 09/24/2017   Pneumococcal Polysaccharide-23 11/12/2009    Past Medical History:  Diagnosis Date   Anemia    chronis   Anxiety    Arthritis    CAD (coronary artery disease), native  coronary artery 02/2024   Coronary CTA showed coronary Ca score of 57 and <25% stenosis of prox-mid RCA and LAD and prox LCx   Cervical cancer (HCC) 1990   cervical    Chronic diastolic CHF (congestive heart failure) (HCC) 08/10/2016   COPD (chronic obstructive pulmonary disease) (HCC)    O2 dependent. Pulmo: Dr. Baldwin Levee   Diabetes mellitus without complication (HCC)    a. A1c 8.3 in 11/2015   Emphysema    Gallstones    s/p cholecystectomy   GERD (gastroesophageal reflux disease)    Gout    Hx of cardiac catheterization    a. LHC [facility] in [place] , [place] 09/2008:  Normal coronary arteries EF 70%. b. LHC 11/2016: normal cors, normal LVEDP, EF 55-65%.   Hyperlipidemia    Hypertension    Hypoxia 11/17/2018   Morbid obesity (HCC)    On home oxygen  therapy    "2.5L; 24/7" (08/10/2016)   OSA on CPAP    CPAP at night    Pneumonia    Sickle cell trait (HCC)    Tobacco abuse    a. up to 3ppd from age 63 to 53, now 1/4 ppd (01/2013) >> Quit 10/2015    Tobacco History: Social History   Tobacco Use  Smoking Status Former   Current packs/day: 0.00   Average packs/day: 3.0 packs/day for 36.0 years (108.0 ttl pk-yrs)   Types: Cigarettes   Start date: 11/11/1979   Quit date: 11/11/2015   Years since quitting: 8.3  Smokeless Tobacco Never  Tobacco Comments   Approx 90 pk-yrs (up to 3ppd until ~ 2009).   Counseling given: Not Answered Tobacco comments: Approx 90 pk-yrs (  up to 3ppd until ~ 2009).   Outpatient Medications Prior to Visit  Medication Sig Dispense Refill   albuterol  (PROVENTIL ) (2.5 MG/3ML) 0.083% nebulizer solution USE 1 VIAL VIA NEBULIZER 4 TIMES DAILY AS NEEDED. 300 mL 4   albuterol  (VENTOLIN  HFA) 108 (90 Base) MCG/ACT inhaler Inhale 2 puffs into the lungs every 4 (four) hours as needed for wheezing or shortness of breath. 18 g 3   allopurinol  (ZYLOPRIM ) 100 MG tablet Take 100 mg by mouth daily.     ALPRAZolam  (XANAX ) 1 MG tablet Take 1 mg by mouth daily as needed for  anxiety.  0   aspirin  EC 81 MG tablet Take 81 mg by mouth daily.     azelastine  (ASTELIN ) 0.1 % nasal spray Place 2 sprays into both nostrils 2 (two) times daily. Use in each nostril as directed 30 mL 12   azithromycin  (ZITHROMAX ) 250 MG tablet Take 1 tablet (250 mg total) by mouth every Monday, Wednesday, and Friday. 12 tablet 6   cetirizine (ZYRTEC) 10 MG tablet Take 10 mg by mouth daily.     Cholecalciferol  (VITAMIN D  PO) Take 20 mcg by mouth daily. 10 mcg each     clobetasol  cream (TEMOVATE ) 0.05 % Apply 1 application topically daily as needed (irritation from pull ups).   0   ferrous sulfate 324 MG TBEC Take 324 mg by mouth 2 (two) times a week.     furosemide  (LASIX ) 40 MG tablet Take 40 mg (1 tablet) by mouth in the morning and 20 mg (1/2 tablet) by mouth in the evening 135 tablet 3   gabapentin  (NEURONTIN ) 300 MG capsule Take 300 mg by mouth 3 (three) times daily as needed (Pain).     HYDROcodone -acetaminophen  (NORCO) 10-325 MG tablet Take 1 tablet by mouth every 4 (four) hours as needed for moderate pain (pain score 4-6).     lubiprostone  (AMITIZA ) 24 MCG capsule Take 24 mcg by mouth 2 (two) times daily.     metoprolol  succinate (TOPROL -XL) 50 MG 24 hr tablet TAKE ONE TABLET BY MOUTH EVERY MORNING FOR BLOOD PRESSURE 90 tablet 2   metoprolol  tartrate (LOPRESSOR ) 100 MG tablet Take 1 tablet (100mg ) TWO hours prior to CT scan 1 tablet 0   OXYGEN  Inhale 3 L into the lungs continuous.     OZEMPIC, 2 MG/DOSE, 8 MG/3ML SOPN Inject 2 mg into the skin once a week.     pantoprazole  (PROTONIX ) 40 MG tablet Take 1 tablet (40 mg total) by mouth 2 (two) times daily. 180 tablet 3   potassium chloride  SA (KLOR-CON  M) 20 MEQ tablet TAKE ONE TABLET BY MOUTH EVERY MORNING 90 tablet 2   predniSONE  (DELTASONE ) 20 MG tablet Take 1 tablet (20 mg total) by mouth daily with breakfast. 7 tablet 0   Respiratory Therapy Supplies (FLUTTER) DEVI 1 Device by Does not apply route 3 (three) times daily. 1 each 0    roflumilast  (DALIRESP ) 500 MCG TABS tablet TAKE ONE TABLET BY MOUTH EVERY MORNING 30 tablet 6   spironolactone  (ALDACTONE ) 25 MG tablet Take 0.5 tablets (12.5 mg total) by mouth daily. 15 tablet 12   Tetrahydroz-Dextran-PEG-Povid (ADVANCED FORMULA EYE DROPS OP) Place 1-2 drops into both eyes daily as needed (Dry eyes).     TRELEGY ELLIPTA  200-62.5-25 MCG/ACT AEPB INHALE 1 PUFF BY MOUTH EVERY DAY 60 each 5   zolpidem  (AMBIEN ) 10 MG tablet Take 10 mg by mouth at bedtime.     No facility-administered medications prior to visit.  Review of Systems  Review of Systems   Physical Exam  LMP 06/04/2000 (LMP Unknown) Comment: tubal ligation Physical Exam   Lab Results:  CBC    Component Value Date/Time   WBC 6.5 09/16/2023 0651   RBC 3.79 (L) 09/16/2023 0651   HGB 10.3 (L) 09/16/2023 0651   HGB 12.3 05/10/2020 1004   HCT 31.2 (L) 09/16/2023 0651   HCT 36.9 05/10/2020 1004   PLT 236 09/16/2023 0651   PLT 193 05/10/2020 1004   MCV 82.3 09/16/2023 0651   MCV 86 05/10/2020 1004   MCH 27.2 09/16/2023 0651   MCHC 33.0 09/16/2023 0651   RDW 13.4 09/16/2023 0651   RDW 15.6 (H) 05/10/2020 1004   LYMPHSABS 2.3 07/25/2023 1830   MONOABS 0.6 07/25/2023 1830   EOSABS 0.0 07/25/2023 1830   BASOSABS 0.1 07/25/2023 1830    BMET    Component Value Date/Time   NA 139 09/16/2023 0651   NA 145 (H) 12/19/2022 1126   K 3.9 09/16/2023 0651   CL 100 09/16/2023 0651   CO2 31 09/16/2023 0651   GLUCOSE 124 (H) 09/16/2023 0651   BUN 10 09/16/2023 0651   BUN 10 12/19/2022 1126   CREATININE 1.10 (H) 02/18/2024 0813   CALCIUM  9.4 09/16/2023 0651   GFRNONAA >60 09/16/2023 0651   GFRAA 69 05/10/2020 1004    BNP    Component Value Date/Time   BNP 10.8 07/25/2023 1830    ProBNP    Component Value Date/Time   PROBNP <36 02/06/2024 1116   PROBNP 11.0 03/15/2022 0941    Imaging: No results found.   Assessment & Plan:   No problem-specific Assessment & Plan notes found for this  encounter.     Antonio Baumgarten, NP 03/23/2024

## 2024-03-24 ENCOUNTER — Encounter: Payer: Self-pay | Admitting: Primary Care

## 2024-03-24 ENCOUNTER — Ambulatory Visit (INDEPENDENT_AMBULATORY_CARE_PROVIDER_SITE_OTHER): Payer: 59 | Admitting: Primary Care

## 2024-03-24 VITALS — BP 105/73 | HR 72 | Temp 97.6°F | Ht 60.0 in | Wt 162.6 lb

## 2024-03-24 DIAGNOSIS — Z87891 Personal history of nicotine dependence: Secondary | ICD-10-CM

## 2024-03-24 DIAGNOSIS — J9611 Chronic respiratory failure with hypoxia: Secondary | ICD-10-CM

## 2024-03-24 DIAGNOSIS — R1319 Other dysphagia: Secondary | ICD-10-CM | POA: Diagnosis not present

## 2024-03-24 DIAGNOSIS — J449 Chronic obstructive pulmonary disease, unspecified: Secondary | ICD-10-CM

## 2024-03-24 NOTE — Patient Instructions (Addendum)
 Orders Speech and swallow eval   Recommendations: Continue Trelegy one puff daily  Continue Daliresp  500mcg daily Restart nasal spray  Use nebulizer 2-3 a day followed by your flutter valve   Follow-up 3 months with Beth NP or sooner if needed    GERD in Adults: What to Know  Gastroesophageal reflux (GER) is when acid from your stomach flows up into your esophagus. Your esophagus is the part of your body that moves food from your mouth to your stomach. Normally, food goes down and stays in your stomach to be digested. But with GER, food and stomach acid may go back up. You may have a disease called gastroesophageal reflux disease (GERD) if the reflux: Happens often. Causes very bad symptoms. Makes your esophagus sore and swollen. Over time, GERD can make small holes called ulcers in the lining of your esophagus. What are the causes? GERD is caused by a problem with the muscle between your esophagus and stomach. This muscle is called the lower esophageal sphincter (LES). When it's weak or not normal, it doesn't close like it should. This means food and stomach acid can go back up into your esophagus. The muscle can be weak if: You smoke or use products with tobacco in them. You're pregnant. You have a type of hernia called a hiatal hernia. You eat certain foods and drinks. These include: Alcohol. Coffee. Chocolate. Onions. Peppermint. What increases the risk? Being overweight. Having a disease that affects your connective tissue. Taking NSAIDs, such as ibuprofen . What are the signs or symptoms? Heartburn. Trouble swallowing. Pain when you swallow. The feeling of having a lump in your throat. A bitter taste in your mouth. Bad breath. Having an upset or bloated stomach. Burping. Chest pain. Other conditions can also cause chest pain. Make sure you see your health care provider if you have chest pain. Wheezing. This is when you make high-pitched whistling sounds when you  breathe, most often when you breathe out. A long-term cough or a cough at night. How is this diagnosed? GERD may be diagnosed based on your medical history and a physical exam. You may also have tests. These may include: An endoscopy. This test looks at your stomach and esophagus with a small camera. A barium swallow test. This shows the shape and size of your esophagus and how well it's working. Tests of your esophagus to check for: Acid levels. Pressure. How is this treated? Treatment may depend on how bad your symptoms are. It may include: Changes to your diet and daily life. Medicines. Surgery. Follow these instructions at home: Eating and drinking Follow an eating plan as told by your provider. You may need to avoid certain foods and drinks. These may include: Coffee and tea, with or without caffeine. Alcohol. Energy drinks and sports drinks. Fizzy drinks or sodas. Chocolate and cocoa. Peppermint and mint flavorings. Garlic and onions. Horseradish. Spicy and acidic foods. These include: Peppers. Chili powder and curry powder. Vinegar. Hot sauces and BBQ sauce. Citrus fruits and juices. These include: Oranges. Lemons. Limes. Tomato-based foods. These include: Red sauce and pizza with red sauce. Chili. Salsa. Fried and fatty foods. These include: Donuts. [place] fries. Potato chips. High-fat dressings. High-fat meats. These include: Hot dogs and sausage. Rib eye steak. Ham and bacon. High-fat dairy items. These include: Whole milk. Butter. Cream cheese. Eat small meals often. Avoid eating big meals. Avoid drinking lots of liquid with your meals. Try not to eat meals during the 2-3 hours before bedtime. Try not  to lie down right after you eat. Do not exercise right after you eat. Lifestyle  If you're overweight, lose an amount of weight that's healthy for you. Ask your provider about a safe weight loss goal. Do not smoke, vape, or use nicotine  or  tobacco. Wear loose clothes. Do not wear things that are tight around your waist. When you sleep, try: Raising the head of your bed about 6 inches (15 cm). You can use a wedge to do this. Lying down on your left side. Try to lower your stress. If you need help doing this, ask your provider. General instructions Take your medicines only as told. Do not take aspirin  or ibuprofen  unless you're told to. Watch for any changes in your symptoms. Do not bend over if it makes your symptoms worse. Contact a health care provider if: You have new symptoms. You have trouble: Drinking. Swallowing. Eating. It hurts to swallow. You have wheezing. You have a cough that won't go away. Your voice is hoarse. Your symptoms don't get better with treatment. Get help right away if: You have pain all of a sudden in your: Arm. Neck. Jaw. Teeth. Back. You feel sweaty, dizzy, or light-headed all of a sudden. You faint. You have chest pain or shortness of breath. You vomit and the vomit is: Green, yellow, or black. Looks like blood or coffee grounds. Your poop is red, bloody, or black. These symptoms may be an emergency. Call 911 right away. Do not wait to see if the symptoms will go away. Do not drive yourself to the hospital. This information is not intended to replace advice given to you by your health care provider. Make sure you discuss any questions you have with your health care provider. Document Revised: 09/10/2023 Document Reviewed: 03/27/2023 Elsevier Patient Education  2024 [employer]     Dysphagia  Dysphagia is trouble swallowing. This condition occurs when solids and liquids stick in a person's throat on the way down to the stomach, or when food takes longer to get to the stomach than usual. You may have problems swallowing food, liquids, or both. You may also have pain while trying to swallow. It may take you more time and effort to swallow something. What are the  causes? This condition may be caused by: Muscle problems. These may make it difficult for you to move food and liquids through the esophagus, which is the tube that connects your mouth to your stomach. Blockages. You may have ulcers, scar tissue, or inflammation that blocks the normal passage of food and liquids. Causes of these problems include: Acid reflux from your stomach into your esophagus (gastroesophageal reflux). Infections. Radiation treatment for cancer. Medicines taken without enough fluids to wash them down into your stomach. Stroke. This can affect the nerves and make it difficult to swallow. Nerve problems. These prevent signals from being sent to the muscles of your esophagus to squeeze (contract) and move what you swallow down to your stomach. Globus pharyngeus. This is a common problem that involves a feeling like something is stuck in your throat or a sense of trouble with swallowing, even though nothing is wrong with the swallowing passages. Certain conditions, such as cerebral palsy or Parkinson's disease. What are the signs or symptoms? Common symptoms of this condition include: A feeling that solids or liquids are stuck in your throat on the way down to the stomach. Pain while swallowing. Coughing or gagging while trying to swallow. Other symptoms include: Food moving back from your  stomach to your mouth (regurgitation). Noises coming from your throat. Chest discomfort when swallowing. A feeling of fullness when swallowing. Drooling, especially when the throat is blocked. Heartburn. How is this diagnosed? This condition may be diagnosed by: Barium swallow X-ray. In this test, you will swallow a white liquid that sticks to the inside of your esophagus. X-ray images are then taken. Endoscopy. In this test, a flexible telescope is inserted down your throat to look at your esophagus and your stomach. CT scans or an MRI. How is this treated? Treatment for dysphagia  depends on the cause of this condition: If the dysphagia is caused by acid reflux or infection, medicines may be used. These may include antibiotics or heartburn medicines. If the dysphagia is caused by problems with the muscles, swallowing therapy may be used to help you strengthen your swallowing muscles. You may have to do specific exercises to strengthen the muscles or stretch them. If the dysphagia is caused by a blockage or mass, procedures to remove the blockage may be done. You may need surgery and a feeding tube. You may need to make diet changes. Ask your health care provider for specific instructions. Follow these instructions at home: Medicines Take over-the-counter and prescription medicines only as told by your health care provider. If you were prescribed an antibiotic medicine, take it as told by your health care provider. Do not stop taking the antibiotic even if you start to feel better. Eating and drinking  Make any diet changes as told by your health care provider. Work with a diet and nutrition specialist (dietitian) to create an eating plan that will help you get the nutrients you need in order to stay healthy. Eat soft foods that are easier to swallow. Cut your food into small pieces and eat slowly. Take small bites. Eat and drink only when you are sitting upright. Do not drink alcohol or caffeine. If you need help quitting, ask your health care provider. General instructions Check your weight every day to make sure you are not losing weight. Do not use any products that contain nicotine  or tobacco. These products include cigarettes, chewing tobacco, and vaping devices, such as e-cigarettes. If you need help quitting, ask your health care provider. Keep all follow-up visits. This is important. Contact a health care provider if: You lose weight because you cannot swallow. You cough when you drink liquids. You cough up partially digested food. Get help right away if: You  cannot swallow your saliva. You have shortness of breath, a fever, or both. Your voice is hoarse and you have trouble swallowing. These symptoms may represent a serious problem that is an emergency. Do not wait to see if the symptoms will go away. Get medical help right away. Call your local emergency services (911 in the [place] Do not drive yourself to the hospital. Summary Dysphagia is trouble swallowing. This condition occurs when solids and liquids stick in a person's throat on the way down to the stomach. You may cough or gag while trying to swallow. Dysphagia has many possible causes. Treatment for dysphagia depends on the cause of the condition. Keep all follow-up visits. This is important. This information is not intended to replace advice given to you by your health care provider. Make sure you discuss any questions you have with your health care provider. Document Revised: 06/17/2020 Document Reviewed: 06/18/2020 Elsevier Patient Education  2024 [employer].

## 2024-04-03 ENCOUNTER — Telehealth: Payer: Self-pay

## 2024-04-03 NOTE — Telephone Encounter (Signed)
 Copied from CRM [id number]. Topic: Appointments - Scheduling Inquiry for Clinic >> Apr 03, 2024  9:27 AM Hilton Lucky wrote: Reason for CRM: Patient states she is waiting to be scheduled for tests ordered by NP Sueanne Emerald. Please reach out to patient to schedule for CT when able.    Spoke w/ PT not a CT scan but its a order for Speech and swallow eval  someone will reach out to her to set the appt.

## 2024-05-08 ENCOUNTER — Other Ambulatory Visit: Payer: Self-pay | Admitting: Primary Care

## 2024-06-03 ENCOUNTER — Telehealth: Payer: Self-pay

## 2024-06-03 DIAGNOSIS — R1319 Other dysphagia: Secondary | ICD-10-CM

## 2024-06-03 NOTE — Telephone Encounter (Signed)
 Copied from CRM [id number]. Topic: Clinical - Request for Lab/Test Order >> Jun 03, 2024 10:47 AM Isabell A wrote: Reason for CRM: Patient states she still hasn't heard back since May in regards to scheduling her Speech and swallow evaluation.   Callback number: [phone]   It does not looked like this was ordered. Beth, can a referral be placed for this, possibly urgent?

## 2024-06-03 NOTE — Progress Notes (Signed)
 error

## 2024-06-04 NOTE — Telephone Encounter (Signed)
 Beth per [employer] the order wasn't placed correctly. It needs to be placed as an imaging order last chest xray. Can you get it placed correctly and I will get it scheduled for the patient Stephanie Schultz wants [facility] location on at Tuesday at 10:00am

## 2024-06-04 NOTE — Telephone Encounter (Signed)
 OK, can we process it

## 2024-06-04 NOTE — Telephone Encounter (Signed)
 I did put the order in  CC: [facility] team

## 2024-06-04 NOTE — Telephone Encounter (Signed)
 The order was never in our work que. It is only in other orders and we never saw the order until today

## 2024-06-09 NOTE — Telephone Encounter (Signed)
 Beth according to [employer] they can't see your order to schedule

## 2024-06-09 NOTE — Telephone Encounter (Signed)
 I wanted SLP eval and treat, I'd like speech pathologist to assess swallowing difficulties. It is not an image study or barium swallow

## 2024-06-10 NOTE — Telephone Encounter (Signed)
 I placed ambulatory referral to speech

## 2024-06-17 NOTE — Telephone Encounter (Signed)
 Beth please don't shoot the messenger I just now saw where you placed the speech referral for this patient. I tried to schedule but they thought I should speak with Jeoffrey Lesches with [facility] phone # [phone]. Amber stated since the order would be for dysphagia the order should be SLP 1002 for outpatient modified barium swallow test. But if for GERD esophagram  would need to be ordered as well

## 2024-06-17 NOTE — Telephone Encounter (Signed)
 The referral has been tagged to [facility]

## 2024-06-17 NOTE — Telephone Encounter (Signed)
 I'm just going to refer to GI, they can decide what they want at this point

## 2024-06-19 ENCOUNTER — Encounter: Payer: Self-pay | Admitting: Gastroenterology

## 2024-06-28 ENCOUNTER — Other Ambulatory Visit: Payer: Self-pay | Admitting: Emergency Medicine

## 2024-06-30 ENCOUNTER — Encounter: Payer: Self-pay | Admitting: Primary Care

## 2024-06-30 ENCOUNTER — Ambulatory Visit (INDEPENDENT_AMBULATORY_CARE_PROVIDER_SITE_OTHER): Admitting: Primary Care

## 2024-06-30 VITALS — BP 126/74 | HR 73 | Temp 97.2°F | Ht 60.0 in | Wt 154.2 lb

## 2024-06-30 DIAGNOSIS — J449 Chronic obstructive pulmonary disease, unspecified: Secondary | ICD-10-CM | POA: Diagnosis not present

## 2024-06-30 DIAGNOSIS — J9611 Chronic respiratory failure with hypoxia: Secondary | ICD-10-CM

## 2024-06-30 DIAGNOSIS — R911 Solitary pulmonary nodule: Secondary | ICD-10-CM | POA: Diagnosis not present

## 2024-06-30 DIAGNOSIS — Z79899 Other long term (current) drug therapy: Secondary | ICD-10-CM

## 2024-06-30 DIAGNOSIS — Z23 Encounter for immunization: Secondary | ICD-10-CM | POA: Diagnosis not present

## 2024-06-30 DIAGNOSIS — J441 Chronic obstructive pulmonary disease with (acute) exacerbation: Secondary | ICD-10-CM

## 2024-06-30 NOTE — Progress Notes (Signed)
 @Patient  ID: Stephanie Schultz, female    DOB: 27-May-1956, 68 y.o.   MRN: [id number]  No chief complaint on file.   Referring provider: Sim Emery CROME, MD  HPI: 68 year old female, former smoker quit in 2016 (108-pack-year history).  Significant for COPD component, chronic cough, chronic respiratory failure O2 dependent, pulmonary nodule, allergic rhinitis, chronic diastolic heart failure, hypertension, GERD, type 2 diabetes, obesity.  Patient of Dr. Shelah. Maintained on Trelegy, Daliresp , prn albuterol  and continuous oxygen  at 3-4L/min.  Previous [facility] pulmonary encounters: 08/07/2023 Patient presents today for surgical risk assessment. She is planning for upcoming hysteroscopy D&C with Dr. Delana with [facility].   Patient had split night sleep study on 06/04/23 showed no evidence of OSA, AHI 0.0/hour. Mean oxygen  saturation was 98% with minimum SPO2 95%. She used 3L supplemental oxygen . Snoring was noted during sleep study.   She was admitted to [facility] Aug 17-20th for AECOPD. She was seen a couple weeks later on 07/25/23 at [facility] ED for COPD exacerbation. CXR showed clear lungs. Completed course of doxycycline  and prednisone . This past week she has been feeling better much better. Maintained on Trelegy 200mcg and Daliresp  500mcg.  Chronic respiratory failure with hypoxia (HCC) - Stable; Continue 3L supplemental oxygen   COPD (chronic obstructive pulmonary disease) (HCC) - Recurrent exacerbations, last seen in ED on 07/25/23. She is feeling better. Symptoms are not acutely exacerbated but due to recurrent flare ups of chronic bronchitis recommend trial Breztri  Aerosphere two puffs twice daily and add daily azithromycin . Continue Daliresp  500mcg daily. Ok to proceed with hysteroscopy D&C with Dr. Delana with [facility]  Hx of sleep apnea - In lab sleep study 06/04/23 was negative for obstructive sleep apnea, AHI 0.0/hour   Recommendations  - Hold Trelegy while  using sample new inhaler  - Trial Breztri  two puffs morning and evening (rinse mouth after use) - Start azithromycin  Monday, Wednesday, Friday (to prevent exacerbations)  - Please take over the counter Zyrtec-D for 5-7 days (monitor BP, if greater than 140/90 consistently please stop medication)   - Continue Daliresp  500 mcg daily - Continue Astelin  nasal spray - Continue to use flutter valve/start using incentive spirometer 2-3 times a day to encourage deep breathing - Ok to receive flu shot today   09/23/2023 Discussed the use of AI scribe software for clinical note transcription with the patient, who gave verbal consent to proceed.  History of Present Illness   Patient presents today for 4-6 week follow-up COPD with recurrent exacerbations. The patient is maintained on Trelegy Ellipta , Daliresp , as needed albuterol , and continuous oxygen . She was last seen in September 2024 for flare up, started on Breztri  Aerosphere and Azithromycin  MWF due to frequent COPD exacerbations. Continues on Dailresp 500mg  daily and flutter valve TID. Up to date with her flu vaccine. Needs EKG today for medication monitoring   The patient reports that during the trial of Breztri , she experienced difficulty breathing between the hours of twelve and two pm, despite taking the medication twice daily. This occurred during a COPD flare-up, leading the patient to discontinue Breztri  and resume Trelegy. The patient expresses interest in retrying Breztri  now that the COPD flare-up has resolved.  The patient reports that the azithromycin  has been effective and continues to take Daliresp  daily. She also reports a chronic cough, which is currently not severe, and occasional mucus production, which is mostly clear. The patient notes that she can identify a COPD flare-up when the mucus becomes white. The patient also  mentions occasional discomfort in the chest, which she attributes to acid reflux, for which she is taking  medication.  The patient reports that her breathing has improved and feels like she is returning to her baseline. She is on three liters of oxygen , which she also uses at night. The patient also mentions that she has made changes to her environment, such as reducing the use of cleaning supplies, to prevent COPD flare-ups.     03/04/24- Acute OV, Dr. Neda Patient with a history of COPD Increased cough, congestion Nasal stuffiness and congestion which she feels may be related to allergies but bringing up more mucus, nasal stuffiness Has not had any fevers or chills No night sweats  She feels symptoms are worsening  She is on oxygen  around-the-clock as also noted some desaturations with activity  She is on Trelegy which she is compliant with  She recently saw Landry Ferrari in November and has an appointment coming up in about a month   Assessment:  COPD with exacerbation  Exacerbation of allergies  Chronic respiratory failure  Acute sinusitis/bronchitis  Chronic respiratory failure on oxygen  supplementation - Continue oxygen  supplementation  Plan/Recommendations: Will call in prescription for azithromycin  to continue using it 3 days a week  Prescription for Augmentin  to treat sinusitis and bronchitis  Short course of steroids  Encouraged to keep appointment in May if not feeling better otherwise to reschedule for about 3 months  Avoid known triggers as possible  Encouraged to give us  a call with any significant concerns    03/24/2024  Discussed the use of AI scribe software for clinical note transcription with the patient, who gave verbal consent to proceed.  History of Present Illness   Stephanie Schultz is a 68 year old female with COPD who presents for follow-up after a recent flare-up and ongoing respiratory symptoms.  In April, she experienced a COPD flare-up and was treated with Augmentin  and steroids, which improved her condition. However, she  continues to have a persistent cough with sputum production, sometimes light green or brown. She has a history of sinus infections. Her current medications include Trelegy inhaler, prn Albuterol  and Daliresp .  She uses oxygen  therapy at two liters when needed, especially during travel. She discontinued Mucinex  due to adverse effects and Robitussin as it worsened her breathing issues.   In April, lung cancer screening showed emphysema and small pulmonary nodules without suspicious findings. Debris in airway. She occasionally experiences aspiration, particularly with foods like hamburgers, and has difficulty swallowing medications, which she attributes to acid reflux.  She has a history of acid reflux and takes pantoprazole  twice daily. Certain foods, especially fried and fatty foods, exacerbate her symptoms. She is adjusting her diet to manage these symptoms, reducing intake of dairy and fried foods.  She reports significant weight loss from 186 pounds in September 2024 to 162 pounds currently, attributing this to dietary changes and Ozempic. Breathing has improved with weight loss.      06/30/2024- Interim hx Discussed the use of AI scribe software for clinical note transcription with the patient, who gave verbal consent to proceed.  History of Present Illness Stephanie Schultz is a 68 year old female with COPD and respiratory failure who presents for follow-up regarding her oxygen  therapy and respiratory status.  She has reduced her oxygen  use from three liters to two liters due to discomfort such as dryness and burning. She has experienced 30-40 pound weight loss over the past year since starting Ozempic.  She can walk short distances without oxygen , such as from her living room to her bedroom and during a brief shopping trip, which she describes as a significant improvement in her quality of life.  She is currently on Trelegy , Daliresp  500 micrograms daily, and azithromycin  250  milligrams on Monday, Wednesday, and Friday. No flare-ups have occurred in the past three to six months, although she has experienced some sinus congestion, which she attributes to seasonal changes and grass mowing. She uses a nasal spray and Astelin  for her sinus symptoms, which she finds effective.  She has a history of reflux and is on Ozempic, which can slow gastric emptying. She takes pantoprazole  (Protonix ) twice daily and reports no issues with reflux or swallowing, although she avoids hamburgers and red meat due to past difficulties with digestion. A recent incident with meatloaf caused discomfort, but no choking or swallowing issues have occurred recently.  She mentions a past referral for a speech and swallow evaluation due to previous swallowing difficulties and debris found in her airway on a CT scan. However, she currently reports no swallowing issues.  Aortic atherosclerosis and calcifications were noted on a lung cancer screening CT scan. She is not currently on cholesterol medication and is unsure of her recent cholesterol levels. Occasional chest discomfort is attributed to gas, and she avoids foods that exacerbate this sensation.   Recent COPD exacerbations: January 19th, 2021-doxycycline , prednisone  taper Mar 15, 2020- Z-Pak April 18, 2020- Z-Pak, prednisone  taper May 17, 2020- Doxycycline , prednisone  taper  03/14/2021 - Increased Trelegy x 2 weeks/ holding off on oral steriods and no indication for abx at this time  05/01/21- Called in with sob/cough and given zpack and prednisone   07/2021- Hospitalized overnight for COPD exacerbation 12/19/2021- Doxy/prednisone  taper  01/11/2022- Pred taper, sample Trelegy (COPD exacerbation from Feb never completely resolved) 01/19/2022- No improvement from 3/10, sent in doxycycline   10/15/22- azithromycin  and prednisone   March 2024- prednisone  and doxycycline   June - COPD exacerbation Augmentin  and prednisone   [facility]  hospital 8/17-20th  September 12th 2024- Ed visit / during OV started on Azithromycin  MWF to prevent flare ups  March 04, 2024- Bronchitis/Sinuitis treated with Augmentin  and prednisone   August 2025- None   No Known Allergies  Immunization History  Administered Date(s) Administered   Influenza Split 09/20/2011, 09/12/2012, 08/12/2013   Influenza, Seasonal, Injecte, Preservative Fre 08/07/2023   Influenza,inj,Quad PF,6+ Mos 08/03/2014, 08/12/2015, 08/12/2017, 07/29/2018, 09/11/2021   Influenza,inj,Quad PF,6-35 Mos 09/11/2019   PFIZER(Purple Top)SARS-COV-2 Vaccination 02/05/2020, 02/25/2020, 11/08/2020   Pneumococcal Conjugate-13 09/24/2017   Pneumococcal Polysaccharide-23 11/12/2009    Past Medical History:  Diagnosis Date   Anemia    chronis   Anxiety    Arthritis    CAD (coronary artery disease), native coronary artery 02/2024   Coronary CTA showed coronary Ca score of 57 and <25% stenosis of prox-mid RCA and LAD and prox LCx   Cervical cancer (HCC) 1990   cervical    Chronic diastolic CHF (congestive heart failure) (HCC) 08/10/2016   COPD (chronic obstructive pulmonary disease) (HCC)    O2 dependent. Pulmo: Dr. Shelah   Diabetes mellitus without complication (HCC)    a. A1c 8.3 in 11/2015   Emphysema    Gallstones    s/p cholecystectomy   GERD (gastroesophageal reflux disease)    Gout    Hx of cardiac catheterization    a. LHC at [facility] in [place] , [place] 09/2008:  Normal coronary arteries EF 70%. b. LHC 11/2016: normal cors,  normal LVEDP, EF 55-65%.   Hyperlipidemia    Hypertension    Hypoxia 11/17/2018   Morbid obesity (HCC)    On home oxygen  therapy    2.5L; 24/7 (08/10/2016)   OSA on CPAP    CPAP at night    Pneumonia    Sickle cell trait (HCC)    Tobacco abuse    a. up to 3ppd from age 32 to 40, now 1/4 ppd (01/2013) >> Quit 10/2015    Tobacco History: Social History   Tobacco Use  Smoking Status Former   Current packs/day: 0.00   Average packs/day: 3.0  packs/day for 36.0 years (108.0 ttl pk-yrs)   Types: Cigarettes   Start date: 11/11/1979   Quit date: 11/11/2015   Years since quitting: 8.6  Smokeless Tobacco Never  Tobacco Comments   Approx 90 pk-yrs (up to 3ppd until ~ 2009).   Counseling given: Not Answered Tobacco comments: Approx 90 pk-yrs (up to 3ppd until ~ 2009).   Outpatient Medications Prior to Visit  Medication Sig Dispense Refill   albuterol  (PROVENTIL ) (2.5 MG/3ML) 0.083% nebulizer solution USE 1 VIAL VIA NEBULIZER 4 TIMES DAILY AS NEEDED. 300 mL 4   albuterol  (VENTOLIN  HFA) 108 (90 Base) MCG/ACT inhaler Inhale 2 puffs into the lungs every 4 (four) hours as needed for wheezing or shortness of breath. 18 g 3   allopurinol  (ZYLOPRIM ) 100 MG tablet Take 100 mg by mouth daily.     ALPRAZolam  (XANAX ) 1 MG tablet Take 1 mg by mouth daily as needed for anxiety.  0   aspirin  EC 81 MG tablet Take 81 mg by mouth daily.     azelastine  (ASTELIN ) 0.1 % nasal spray USE 2 SPRAYS IN EACH NOSTRIL TWICE DAILY 30 mL 12   azithromycin  (ZITHROMAX ) 250 MG tablet Take 1 tablet (250 mg total) by mouth every Monday, Wednesday, and Friday. 12 tablet 6   cetirizine (ZYRTEC) 10 MG tablet Take 10 mg by mouth daily.     Cholecalciferol  (VITAMIN D  PO) Take 20 mcg by mouth daily. 10 mcg each     clobetasol  cream (TEMOVATE ) 0.05 % Apply 1 application topically daily as needed (irritation from pull ups).   0   ferrous sulfate 324 MG TBEC Take 324 mg by mouth 2 (two) times a week.     furosemide  (LASIX ) 40 MG tablet Take 40 mg (1 tablet) by mouth in the morning and 20 mg (1/2 tablet) by mouth in the evening 135 tablet 3   gabapentin  (NEURONTIN ) 300 MG capsule Take 300 mg by mouth 3 (three) times daily as needed (Pain).     HYDROcodone -acetaminophen  (NORCO) 10-325 MG tablet Take 1 tablet by mouth every 4 (four) hours as needed for moderate pain (pain score 4-6).     lubiprostone  (AMITIZA ) 24 MCG capsule Take 24 mcg by mouth 2 (two) times daily.      metoprolol  succinate (TOPROL -XL) 50 MG 24 hr tablet TAKE ONE TABLET BY MOUTH EVERY MORNING FOR BLOOD PRESSURE 90 tablet 2   metoprolol  tartrate (LOPRESSOR ) 100 MG tablet Take 1 tablet (100mg ) TWO hours prior to CT scan 1 tablet 0   OXYGEN  Inhale 3 L into the lungs continuous.     OZEMPIC, 2 MG/DOSE, 8 MG/3ML SOPN Inject 2 mg into the skin once a week.     pantoprazole  (PROTONIX ) 40 MG tablet Take 1 tablet (40 mg total) by mouth 2 (two) times daily. 180 tablet 3   potassium chloride  SA (KLOR-CON  M) 20 MEQ tablet TAKE  ONE TABLET BY MOUTH EVERY MORNING 90 tablet 2   Respiratory Therapy Supplies (FLUTTER) DEVI 1 Device by Does not apply route 3 (three) times daily. 1 each 0   roflumilast  (DALIRESP ) 500 MCG TABS tablet TAKE ONE TABLET BY MOUTH EVERY MORNING 30 tablet 6   spironolactone  (ALDACTONE ) 25 MG tablet Take 0.5 tablets (12.5 mg total) by mouth daily. 15 tablet 12   Tetrahydroz-Dextran-PEG-Povid (ADVANCED FORMULA EYE DROPS OP) Place 1-2 drops into both eyes daily as needed (Dry eyes).     TRELEGY ELLIPTA  200-62.5-25 MCG/ACT AEPB INHALE 1 PUFF BY MOUTH EVERY DAY 60 each 5   zolpidem  (AMBIEN ) 10 MG tablet Take 10 mg by mouth at bedtime.     No facility-administered medications prior to visit.    Review of Systems  Review of Systems  Constitutional: Negative.   HENT: Negative.    Respiratory: Negative.    Cardiovascular: Negative.    Physical Exam  LMP 06/04/2000 (LMP Unknown) Comment: tubal ligation Physical Exam Constitutional:      General: She is not in acute distress.    Appearance: Normal appearance. She is not ill-appearing.  HENT:     Head: Normocephalic and atraumatic.     Mouth/Throat:     Mouth: Mucous membranes are moist.     Pharynx: Oropharynx is clear.  Cardiovascular:     Rate and Rhythm: Normal rate and regular rhythm.  Pulmonary:     Effort: Pulmonary effort is normal.     Breath sounds: Normal breath sounds. No wheezing or rhonchi.     Comments: 2L  Skin:     General: Skin is warm and dry.  Neurological:     General: No focal deficit present.     Mental Status: She is alert and oriented to person, place, and time. Mental status is at baseline.  Psychiatric:        Mood and Affect: Mood normal.        Behavior: Behavior normal.        Thought Content: Thought content normal.        Judgment: Judgment normal.      Lab Results:  CBC    Component Value Date/Time   WBC 6.5 09/16/2023 0651   RBC 3.79 (L) 09/16/2023 0651   HGB 10.3 (L) 09/16/2023 0651   HGB 12.3 05/10/2020 1004   HCT 31.2 (L) 09/16/2023 0651   HCT 36.9 05/10/2020 1004   PLT 236 09/16/2023 0651   PLT 193 05/10/2020 1004   MCV 82.3 09/16/2023 0651   MCV 86 05/10/2020 1004   MCH 27.2 09/16/2023 0651   MCHC 33.0 09/16/2023 0651   RDW 13.4 09/16/2023 0651   RDW 15.6 (H) 05/10/2020 1004   LYMPHSABS 2.3 07/25/2023 1830   MONOABS 0.6 07/25/2023 1830   EOSABS 0.0 07/25/2023 1830   BASOSABS 0.1 07/25/2023 1830    BMET    Component Value Date/Time   NA 139 09/16/2023 0651   NA 145 (H) 12/19/2022 1126   K 3.9 09/16/2023 0651   CL 100 09/16/2023 0651   CO2 31 09/16/2023 0651   GLUCOSE 124 (H) 09/16/2023 0651   BUN 10 09/16/2023 0651   BUN 10 12/19/2022 1126   CREATININE 1.10 (H) 02/18/2024 0813   CALCIUM  9.4 09/16/2023 0651   GFRNONAA >60 09/16/2023 0651   GFRAA 69 05/10/2020 1004    BNP    Component Value Date/Time   BNP 10.8 07/25/2023 1830    ProBNP    Component Value Date/Time  PROBNP <36 02/06/2024 1116   PROBNP 11.0 03/15/2022 0941    Imaging: No results found.   Assessment & Plan:   1. Chronic obstructive pulmonary disease with acute exacerbation (HCC) (Primary)  2. Pulmonary nodule  3. Chronic respiratory failure with hypoxia (HCC)  Assessment and Plan Assessment & Plan Chronic obstructive pulmonary disease (COPD) with emphysema and chronic respiratory failure requiring supplemental oxygen  COPD with emphysema and chronic respiratory  failure. She reports improvement with reduced oxygen  requirement from 3 L to 2 L. No exacerbations in the last 3-6 months. Weight loss may contribute to reduced oxygen  need. Lungs clear on auscultation. - Check oxygen  levels at rest and during ambulation - Perform EKG to monitor QTc interval due to azithromycin  use - Administer pneumonia vaccine - Provide refills for Trelegy, Daliresp , and azithromycin  - Refer to pulmonary rehab at [facility]  Dysphagia with possible aspiration  Dysphagia with possible aspiration. CT scan showed airway debris, indicating possible aspiration. No current swallowing difficulties except while eating meat - Keep gastroenterology appointment to evaluate for aspiration and dysphagia  Coronary artery disease with aortic atherosclerosis Coronary artery disease with aortic atherosclerosis noted on lung cancer screening. No recent cholesterol levels available. No current chest pain, but history of chest discomfort possibly related to gas. - Follow up with primary care to check cholesterol levels and assess need for cardiology referral  Gastroesophageal reflux disease (GERD) GERD managed with pantoprazole  (Protonix ) twice daily. No current reflux symptoms or swallowing issues. Previous difficulty with red meat causing gas and discomfort. - Cancel GI and speech evaluation appointments unless symptoms return  Chronic rhinitis with postnasal drip Chronic rhinitis with postnasal drip. Reports nasal congestion, possibly related to environmental factors. Astelin  nasal spray used effectively. - Continue Astelin  nasal spray twice daily  Obesity, improved with weight loss Significant weight loss of 30-40 pounds over the last year. Improved overall health and reduced oxygen  requirement noted. - Continue Ozempic   Former smoker  - Following with lung cancer screening program LDCT 02/12/24>> Lung RADS 2,  benign appearance or behavior. Continue annual screening with  low-dose chest CT without contrast in 12 months  Hx of sleep apnea - In lab sleep study 06/04/23 was negative for obstructive sleep apnea, AHI 0.0/hour    Almarie LELON Ferrari, NP 06/30/2024

## 2024-06-30 NOTE — Patient Instructions (Addendum)
  VISIT SUMMARY: Today, you had a follow-up appointment to discuss your oxygen  therapy and respiratory status. You have reduced your oxygen  use and experienced significant weight loss, which has improved your quality of life. We reviewed your current medications and discussed your recent health changes, including your sinus congestion and past swallowing difficulties.  YOUR PLAN: -CHRONIC OBSTRUCTIVE PULMONARY DISEASE (COPD) WITH EMPHYSEMA AND CHRONIC RESPIRATORY FAILURE: COPD is a chronic lung condition that makes it hard to breathe. You have shown improvement with reduced oxygen  needs. We will check your oxygen  levels at rest and while walking, perform an EKG to monitor your heart due to your medication, and give you a pneumonia vaccine. Your prescriptions for Trelegy, Daliresp , and azithromycin  have been refilled. You are also referred to pulmonary rehab at [facility].  -DYSPHAGIA WITH POSSIBLE ASPIRATION AND BENIGN PULMONARY NODULES: Dysphagia means difficulty swallowing, and aspiration is when food or liquid enters the airway. Your CT scan showed benign nodules and airway debris, suggesting possible aspiration. Although you have no current swallowing issues except while eating meat, you should keep your gastroenterology appointment to evaluate this further.  -CORONARY ARTERY DISEASE WITH AORTIC ATHEROSCLEROSIS: Coronary artery disease is a condition where the heart's blood vessels are narrowed or blocked. Aortic atherosclerosis is the buildup of plaque in the aorta. We noted these conditions on your lung cancer screening. You should follow up with your primary care doctor to check your cholesterol levels and see if you need a referral to a heart specialist.  -GASTROESOPHAGEAL REFLUX DISEASE (GERD): GERD is a condition where stomach acid frequently flows back into the tube connecting your mouth and stomach. Your GERD is managed with pantoprazole , and you have no current symptoms.   -CHRONIC  RHINITIS WITH POSTNASAL DRIP: Chronic rhinitis is long-term inflammation of the nasal passages, often causing a runny nose and congestion. You have been using Astelin  nasal spray effectively. Continue using it twice daily.  -OBESITY, IMPROVED WITH WEIGHT LOSS: Obesity is a condition of having excess body fat. You have lost 30-40 pounds over the last year, which has improved your overall health and reduced your need for oxygen .  INSTRUCTIONS: Please follow up with your primary care doctor to check your cholesterol levels and assess the need for a cardiology referral. Keep your gastroenterology appointment to evaluate for aspiration and dysphagia. Continue using your current medications as prescribed and attend pulmonary rehab at [facility]. We will check your oxygen  levels and perform an EKG at your next visit.  Follow-up 6 months with Dr. Shelah Mow text generated by [employer].

## 2024-06-30 NOTE — Addendum Note (Signed)
 Addended by: HOPE ALMARIE ORN on: 06/30/2024 05:07 PM   Modules accepted: Orders

## 2024-07-08 ENCOUNTER — Ambulatory Visit: Attending: Primary Care | Admitting: Speech Pathology

## 2024-08-01 ENCOUNTER — Other Ambulatory Visit: Payer: Self-pay | Admitting: Emergency Medicine

## 2024-08-01 ENCOUNTER — Other Ambulatory Visit: Payer: Self-pay | Admitting: Cardiology

## 2024-08-06 ENCOUNTER — Ambulatory Visit: Admitting: Gastroenterology

## 2024-08-06 NOTE — Progress Notes (Deleted)
 SABRA

## 2024-09-22 ENCOUNTER — Ambulatory Visit: Admitting: Primary Care

## 2024-09-22 DIAGNOSIS — J9611 Chronic respiratory failure with hypoxia: Secondary | ICD-10-CM

## 2024-09-22 DIAGNOSIS — J449 Chronic obstructive pulmonary disease, unspecified: Secondary | ICD-10-CM

## 2024-09-23 ENCOUNTER — Other Ambulatory Visit: Payer: Self-pay | Admitting: Emergency Medicine

## 2024-09-28 ENCOUNTER — Other Ambulatory Visit: Payer: Self-pay

## 2024-09-28 ENCOUNTER — Ambulatory Visit: Admitting: Primary Care

## 2024-09-28 ENCOUNTER — Encounter (HOSPITAL_COMMUNITY): Payer: Self-pay

## 2024-09-28 ENCOUNTER — Emergency Department (HOSPITAL_COMMUNITY)
Admission: EM | Admit: 2024-09-28 | Discharge: 2024-09-28 | Disposition: A | Discharge status: Home / Self Care | Attending: Emergency Medicine | Admitting: Emergency Medicine

## 2024-09-28 ENCOUNTER — Emergency Department (HOSPITAL_COMMUNITY)

## 2024-09-28 DIAGNOSIS — I509 Heart failure, unspecified: Secondary | ICD-10-CM | POA: Insufficient documentation

## 2024-09-28 DIAGNOSIS — J449 Chronic obstructive pulmonary disease, unspecified: Secondary | ICD-10-CM | POA: Insufficient documentation

## 2024-09-28 DIAGNOSIS — J9611 Chronic respiratory failure with hypoxia: Secondary | ICD-10-CM

## 2024-09-28 DIAGNOSIS — R0602 Shortness of breath: Secondary | ICD-10-CM | POA: Diagnosis not present

## 2024-09-28 DIAGNOSIS — Z7982 Long term (current) use of aspirin: Secondary | ICD-10-CM | POA: Diagnosis not present

## 2024-09-28 DIAGNOSIS — R0789 Other chest pain: Secondary | ICD-10-CM | POA: Insufficient documentation

## 2024-09-28 DIAGNOSIS — R079 Chest pain, unspecified: Secondary | ICD-10-CM

## 2024-09-28 DIAGNOSIS — R309 Painful micturition, unspecified: Secondary | ICD-10-CM | POA: Diagnosis not present

## 2024-09-28 DIAGNOSIS — M549 Dorsalgia, unspecified: Secondary | ICD-10-CM | POA: Insufficient documentation

## 2024-09-28 DIAGNOSIS — E119 Type 2 diabetes mellitus without complications: Secondary | ICD-10-CM | POA: Diagnosis not present

## 2024-09-28 DIAGNOSIS — I110 Hypertensive heart disease with heart failure: Secondary | ICD-10-CM | POA: Diagnosis not present

## 2024-09-28 DIAGNOSIS — Z9981 Dependence on supplemental oxygen: Secondary | ICD-10-CM | POA: Insufficient documentation

## 2024-09-28 LAB — BASIC METABOLIC PANEL WITH GFR
Anion gap: 10 (ref 5–15)
BUN: 16 mg/dL (ref 8–23)
CO2: 30 mmol/L (ref 22–32)
Calcium: 10 mg/dL (ref 8.9–10.3)
Chloride: 96 mmol/L — ABNORMAL LOW (ref 98–111)
Creatinine, Ser: 0.85 mg/dL (ref 0.44–1.00)
GFR, Estimated: >60 mL/min (ref >60)
Glucose, Bld: 99 mg/dL (ref 70–99)
Potassium: 3.8 mmol/L (ref 3.5–5.1)
Sodium: 137 mmol/L (ref 135–145)

## 2024-09-28 LAB — TROPONIN T, HIGH SENSITIVITY
Troponin T High Sensitivity: <15 ng/L (ref 0–19)
Troponin T High Sensitivity: <15 ng/L (ref 0–19)

## 2024-09-28 LAB — URINALYSIS, W/ REFLEX TO CULTURE (INFECTION SUSPECTED)
Bacteria, UA: NONE SEEN
Glucose, UA: NEGATIVE mg/dL
Hgb urine dipstick: NEGATIVE
Ketones, ur: NEGATIVE mg/dL
Leukocytes,Ua: NEGATIVE
Nitrite: NEGATIVE
Protein, ur: NEGATIVE mg/dL
RBC / HPF: NONE SEEN RBC/hpf (ref 0–5)
Specific Gravity, Urine: 1.015 (ref 1.005–1.030)
WBC, UA: NONE SEEN WBC/hpf (ref 0–5)
pH: 5.5 (ref 5.0–8.0)

## 2024-09-28 LAB — CBC
HCT: 31.2 % — ABNORMAL LOW (ref 36.0–46.0)
Hemoglobin: 10.6 g/dL — ABNORMAL LOW (ref 12.0–15.0)
MCH: 28.2 pg (ref 26.0–34.0)
MCHC: 34 g/dL (ref 30.0–36.0)
MCV: 83 fL (ref 80.0–100.0)
Platelets: 317 K/uL (ref 150–400)
RBC: 3.76 MIL/uL — ABNORMAL LOW (ref 3.87–5.11)
RDW: 12.7 % (ref 11.5–15.5)
WBC: 6.6 K/uL (ref 4.0–10.5)
nRBC: 0 % (ref 0.0–0.2)

## 2024-09-28 NOTE — ED Provider Notes (Signed)
 [place] EMERGENCY DEPARTMENT AT [facility] Provider Note   CSN: [id number] Arrival date & time: 09/28/24  9144     Patient presents with: Chest Pain   Stephanie Schultz is a 68 y.o. female.    Chest Pain    Patient has a history of hypertension, anxiety, acid reflux, sickle cell trait, diabetes, emphysema, COPD, CHF.  Patient is on chronic home oxygen .  Patient reports having symptoms ongoing for 3 weeks now.  Patient states she has been having some discomfort in her chest.  Patient states start described.  Just does not feel right.  Patient states she is also been feeling short of breath for the last 3 weeks.  She has not noticed any increased leg swelling.  No increased cough.  No fevers or chills.  Patient also reports having some urinary discomfort and discomfort in her back.  She has an appointment with her doctor in a week or so but decided to come to the ED today because the symptoms were persisting and she did not think she should wait that long.  Prior to Admission medications   Medication Sig Start Date End Date Taking? Authorizing Provider  albuterol  (PROVENTIL ) (2.5 MG/3ML) 0.083% nebulizer solution USE 1 VIAL in NEBULIZER 4 TIMES DAILY AS NEEDED. 09/23/24   Shelah Lamar RAMAN, MD  albuterol  (VENTOLIN  HFA) 108 (90 Base) MCG/ACT inhaler Inhale 2 puffs into the lungs every 4 (four) hours as needed for wheezing or shortness of breath. 06/15/22   Hope Almarie ORN, NP  allopurinol  (ZYLOPRIM ) 100 MG tablet Take 100 mg by mouth daily.    [provider]  ALPRAZolam  (XANAX ) 1 MG tablet Take 1 mg by mouth daily as needed for anxiety. 04/13/16   [provider]  aspirin  EC 81 MG tablet Take 81 mg by mouth daily.    [provider]  azelastine  (ASTELIN ) 0.1 % nasal spray USE 2 SPRAYS IN EACH NOSTRIL TWICE DAILY 05/11/24   Hope Almarie ORN, NP  azithromycin  (ZITHROMAX ) 250 MG tablet Take 1 tablet (250 mg total) by mouth every Monday, Wednesday, and  Friday. 03/04/24   Neda Jennet LABOR, MD  cetirizine (ZYRTEC) 10 MG tablet Take 10 mg by mouth daily. 08/14/19   [provider]  Cholecalciferol  (VITAMIN D  PO) Take 20 mcg by mouth daily. 10 mcg each    [provider]  clobetasol  cream (TEMOVATE ) 0.05 % Apply 1 application topically daily as needed (irritation from pull ups).  03/25/17   [provider]  ferrous sulfate 324 MG TBEC Take 324 mg by mouth 2 (two) times a week.    [provider]  furosemide  (LASIX ) 40 MG tablet Take 40 mg (1 tablet) by mouth in the morning and 20 mg (1/2 tablet) by mouth in the evening 10/30/22   Lucien Orren SAILOR, PA-C  gabapentin  (NEURONTIN ) 300 MG capsule Take 300 mg by mouth 3 (three) times daily as needed (Pain). 05/23/21   [provider]  HYDROcodone -acetaminophen  (NORCO) 10-325 MG tablet Take 1 tablet by mouth every 4 (four) hours as needed for moderate pain (pain score 4-6).    [provider]  lubiprostone  (AMITIZA ) 24 MCG capsule Take 24 mcg by mouth 2 (two) times daily. 01/28/24   [provider]  metoprolol  succinate (TOPROL -XL) 50 MG 24 hr tablet TAKE ONE TABLET BY MOUTH EVERY MORNING FOR BLOOD PRESSURE 08/03/24   Shlomo Wilbert SAUNDERS, MD  metoprolol  tartrate (LOPRESSOR ) 100 MG tablet Take 1 tablet (100mg ) TWO hours prior  to CT scan 02/17/24   Shlomo Wilbert SAUNDERS, MD  OXYGEN  Inhale 3 L into the lungs continuous.    [provider]  OZEMPIC, 2 MG/DOSE, 8 MG/3ML SOPN Inject 2 mg into the skin once a week. 01/21/24   [provider]  pantoprazole  (PROTONIX ) 40 MG tablet Take 1 tablet (40 mg total) by mouth 2 (two) times daily. 06/19/22   Lucien Orren SAILOR, PA-C  potassium chloride  SA (KLOR-CON  M) 20 MEQ tablet TAKE ONE TABLET BY MOUTH EVERY MORNING 08/03/24   Shlomo Wilbert SAUNDERS, MD  Respiratory Therapy Supplies (FLUTTER) DEVI 1 Device by Does not apply route 3 (three) times daily. 12/03/18   Oley Bascom RAMAN, NP  roflumilast  (DALIRESP ) 500 MCG TABS tablet  TAKE ONE TABLET BY MOUTH EVERY MORNING 08/01/24   Shelah Lamar RAMAN, MD  spironolactone  (ALDACTONE ) 25 MG tablet Take 0.5 tablets (12.5 mg total) by mouth daily. 07/13/21   Shlomo Wilbert SAUNDERS, MD  Tetrahydroz-Dextran-PEG-Povid (ADVANCED FORMULA EYE DROPS OP) Place 1-2 drops into both eyes daily as needed (Dry eyes).    [provider]  TRELEGY ELLIPTA  200-62.5-25 MCG/ACT AEPB INHALE 1 PUFF BY MOUTH EVERY DAY 06/28/24   Shelah Lamar RAMAN, MD  zolpidem  (AMBIEN ) 10 MG tablet Take 10 mg by mouth at bedtime.    [provider]    Allergies: Patient has no known allergies.    Review of Systems  Cardiovascular:  Positive for chest pain.    Updated Vital Signs BP 130/67 (BP Location: Left Arm)   Pulse 84   Temp 98.5 F (36.9 C)   Resp 18   LMP 06/04/2000 (LMP Unknown) Comment: tubal ligation  SpO2 93%   Physical Exam Vitals and nursing note reviewed.  Constitutional:      General: She is not in acute distress.    Appearance: She is well-developed. She is not diaphoretic.  HENT:     Head: Normocephalic and atraumatic.     Right Ear: External ear normal.     Left Ear: External ear normal.  Eyes:     General: No scleral icterus.       Right eye: No discharge.        Left eye: No discharge.     Conjunctiva/sclera: Conjunctivae normal.  Neck:     Trachea: No tracheal deviation.  Cardiovascular:     Rate and Rhythm: Normal rate and regular rhythm.  Pulmonary:     Effort: Pulmonary effort is normal. No respiratory distress.     Breath sounds: Normal breath sounds. No stridor. No decreased breath sounds, wheezing or rales.  Chest:     Chest wall: No edema. There is no dullness to percussion.  Abdominal:     General: Bowel sounds are normal. There is no distension.     Palpations: Abdomen is soft.     Tenderness: There is no abdominal tenderness. There is no guarding or rebound.  Musculoskeletal:        General: No tenderness or deformity.     Cervical back: Neck supple.   Skin:    General: Skin is warm and dry.     Findings: No rash.  Neurological:     General: No focal deficit present.     Mental Status: She is alert.     Cranial Nerves: No cranial nerve deficit, dysarthria or facial asymmetry.     Sensory: No sensory deficit.     Motor: No abnormal muscle tone or seizure activity.     Coordination: Coordination normal.  Psychiatric:        Mood and Affect: Mood normal.     (all labs ordered are listed, but only abnormal results are displayed) Labs Reviewed  BASIC METABOLIC PANEL WITH GFR - Abnormal; Notable for the following components:      Result Value   Chloride 96 (*)    All other components within normal limits  CBC - Abnormal; Notable for the following components:   RBC 3.76 (*)    Hemoglobin 10.6 (*)    HCT 31.2 (*)    All other components within normal limits  URINALYSIS, W/ REFLEX TO CULTURE (INFECTION SUSPECTED) - Abnormal; Notable for the following components:   Bilirubin Urine SMALL (*)    All other components within normal limits  TROPONIN T, HIGH SENSITIVITY  TROPONIN T, HIGH SENSITIVITY    EKG: EKG Interpretation Date/Time:  Monday September 28 2024 10:18:31 EST Ventricular Rate:  78 PR Interval:  142 QRS Duration:  91 QT Interval:  385 QTC Calculation: 439 R Axis:   69  Text Interpretation: Sinus rhythm Nonspecific T abnormalities, lateral leads Baseline wander in lead(s) II III aVF No significant change since last tracing Confirmed by Randol Simmonds [id number]) on 09/28/2024 3:08:06 PM  Radiology: [facility] Chest 2 View Result Date: 09/28/2024 CLINICAL DATA:  Chest pain and shortness of breath. EXAM: CHEST - 2 VIEW COMPARISON:  07/25/2023.  CT chest 02/12/2024. FINDINGS: Trachea is midline. Heart size stable. Thoracic aorta is calcified. No airspace consolidation or pleural fluid. Minimal scarring in the lung bases. Degenerative changes in the spine. Old rib fractures. IMPRESSION: No acute findings. Electronically Signed   By:  Newell Eke M.D.   On: 09/28/2024 10:22     Procedures   Medications Ordered in the ED - No data to display  Clinical Course as of 09/28/24 1523  Mon Sep 28, 2024  1501 Urinalysis, w/ Reflex to Culture (Infection Suspected) -Urine, Clean Catch(!) Normal [JK]  1501 CBC(!) Hemoglobin stable [JK]  1501 Basic metabolic panel(!) Normal [JK]  1501 Troponin T, High Sensitivity Normal [JK]  1502 Chest x-ray without acute findings [JK]    Clinical Course User Index [JK] Randol Simmonds, MD                                 Medical Decision Making Amount and/or Complexity of Data Reviewed Labs: ordered. Decision-making details documented in ED Course. Radiology: ordered.   Patient presented with persistent symptoms ongoing for the last few weeks.  ED workup reassuring.  X-ray does not show evidence of pneumonia.  No signs of pulmonary edema.  Patient's serial cardiac enzymes are unremarkable.  EKG not suggestive ischemic changes.  Patient is not having any respiratory difficulty in the ED.  No hypoxia noted on her supplemental oxygen .  Patient is not tachycardic or tachypneic.    Patient will complained of urinary symptoms although she does not have any evidence of UTI based on her urinalysis.  Etiology of symptoms unclear.  May be related to her chronic COPD.  Evaluation and diagnostic testing in the emergency department does not suggest an emergent condition requiring admission or immediate intervention beyond what has been performed at this time.  The patient is safe for discharge and has been instructed to return immediately for worsening symptoms, change in symptoms or any other concerns.      Final diagnoses:  Chest pain, unspecified type    ED Discharge Orders  None          Randol Simmonds, MD 09/28/24 [phone]

## 2024-09-28 NOTE — ED Triage Notes (Signed)
 Pt c/o chest pain for the past 3 weeks. Also been having sob with it as well. Pt also c/o dark urine, pain in her back, dysuria and a smell to her urine for the past two weeks.

## 2024-09-28 NOTE — Discharge Instructions (Signed)
 The test in the ED  did not show any signs of heart injury.  No signs of pneumonia.  Your Urinalysis did not show a urinary tract infection.  Follow-up with your doctor for further evaluation as we discussed.

## 2024-10-05 ENCOUNTER — Telehealth: Payer: Self-pay

## 2024-10-05 MED ORDER — AZITHROMYCIN 250 MG PO TABS: 250.0000 mg | ORAL_TABLET | ORAL | 2 refills | Status: AC

## 2024-10-05 NOTE — Telephone Encounter (Signed)
 Received fax to refill zithromax ,refill sent

## 2024-10-06 ENCOUNTER — Ambulatory Visit (INDEPENDENT_AMBULATORY_CARE_PROVIDER_SITE_OTHER): Admitting: Primary Care

## 2024-10-06 ENCOUNTER — Encounter: Payer: Self-pay | Admitting: Primary Care

## 2024-10-06 VITALS — BP 124/60 | HR 79 | Temp 97.3°F | Ht 60.0 in | Wt 140.4 lb

## 2024-10-06 DIAGNOSIS — R911 Solitary pulmonary nodule: Secondary | ICD-10-CM

## 2024-10-06 DIAGNOSIS — J449 Chronic obstructive pulmonary disease, unspecified: Secondary | ICD-10-CM

## 2024-10-06 DIAGNOSIS — J9611 Chronic respiratory failure with hypoxia: Secondary | ICD-10-CM

## 2024-10-06 NOTE — Patient Instructions (Signed)
  VISIT SUMMARY: During your visit, we discussed your persistent chest pain and urinary symptoms. We reviewed your history of COPD, your current medications, and your recent weight loss. We also addressed your chest pain, which appears to be musculoskeletal, and your urinary symptoms, which are improving with treatment. Additionally, we reviewed your management plan for COPD, GERD, and your recent weight loss.  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 You should continue taking Trelegy, Daliresp , and azithromycin  as prescribed. Keep participating in pulmonary rehab and maintain your oxygen  therapy at 2 liters, increasing to 3 liters during rehab if needed. Always have your rescue inhaler available and avoid strong perfumes and colognes; wear a mask if necessary.  -URINARY TRACT INFECTION: A UTI is an infection in any part of your urinary system. Your symptoms are improving with Keflex 500 mg, so continue taking it as prescribed.  -CHEST PAIN, LIKELY MUSCULOSKELETAL: Your chest pain is likely due to muscle strain, possibly from physical activity. Since no heart issues were found during recent ED visit. Make sure to schedule mammogram.   -GASTROESOPHAGEAL REFLUX DISEASE: GERD is a condition where stomach acid frequently flows back into the tube connecting your mouth and stomach. Continue taking pantoprazole  as it is managing your symptoms well.  -OBESITY, IMPROVED WITH WEIGHT LOSS: Obesity is a condition of having excess body fat. Your weight loss of 30-40 pounds is excellent and has helped improve your breathing. Continue with your current management and lifestyle changes.  INSTRUCTIONS: Please continue with your current medications and therapies as discussed. We will schedule a mammogram for further evaluation of the pulmonary nodule. If your chest pain worsens, consider taking  prednisone . Follow up with us  if you have any new or worsening symptoms.  Follow-up 6 months with Dr. Shelah or Landry NP

## 2024-10-06 NOTE — Progress Notes (Signed)
 @Patient  ID: Stephanie Schultz, female    DOB: Dec 30, 1955, 68 y.o.   MRN: [id number]  Chief Complaint  Patient presents with   COPD    Uses 2l o2 cont and hs     Referring provider: Sim Emery CROME, MD  HPI: 68 year old female, former smoker quit in 2016 (108-pack-year history).  Significant for COPD component, chronic cough, chronic respiratory failure O2 dependent, pulmonary nodule, allergic rhinitis, chronic diastolic heart failure, hypertension, GERD, type 2 diabetes, obesity.  Patient of Dr. Shelah. Maintained on Trelegy, Daliresp , prn albuterol  and continuous oxygen  at 3-4L/min.  10/06/2024 Discussed the use of AI scribe software for clinical note transcription with the patient, who gave verbal consent to proceed.  History of Present Illness Stephanie Schultz Stephanie Schultz is a 68 year old female with COPD who presents with persistent chest pain and urinary symptoms.  She has been experiencing persistent chest pain for the past three weeks, described as a nagging sensation located in the center of her chest, slightly to the left near the sternum. Despite undergoing an EKG and chest x-ray, and having cardiac enzymes checked, the pain persists, though it has eased somewhat.  She reports urinary symptoms characterized by frequent urination with minimal output, described as 'three little dots', and a sensation of needing to return to the bathroom shortly after. Although no evidence of a UTI was found (urine culture was contaminated), she is currently on Keflex 500 mg, and the symptoms are improving.  She has a history of COPD and is on Trelegy, Daliresp , and azithromycin . She participates in pulmonary rehab and uses oxygen  at two liters per minute. She experienced difficulty breathing at church due to strong perfumes and uses a rescue inhaler as needed. She reports occasional green mucus production, attributed to environmental factors like cleaning supplies, and has adjusted her  cleaning habits to avoid triggers.  She is on pantoprazole  for reflux and reports no recent episodes of choking or gagging. She is also on Ozempic and has lost 30-40 pounds, which she feels has helped with her breathing.  Hx OSA, last sleep study in July 2024 was negative    Recent COPD exacerbations: January 19th, 2021-doxycycline , prednisone  taper Mar 15, 2020- Z-Pak April 18, 2020- Z-Pak, prednisone  taper May 17, 2020- Doxycycline , prednisone  taper  03/14/2021 - Increased Trelegy x 2 weeks/ holding off on oral steriods and no indication for abx at this time  05/01/21- Called in with sob/cough and given zpack and prednisone   07/2021- Hospitalized overnight for COPD exacerbation 12/19/2021- Doxy/prednisone  taper  01/11/2022- Pred taper, sample Trelegy (COPD exacerbation from Feb never completely resolved) 01/19/2022- No improvement from 3/10, sent in doxycycline   10/15/22- azithromycin  and prednisone   March 2024- prednisone  and doxycycline   June - COPD exacerbation Augmentin  and prednisone   August [facility] hospital 8/17-20th  September 12th 2024- Ed visit / during OV started on Azithromycin  MWF to prevent flare ups  March 04, 2024- Bronchitis/Sinuitis treated with Augmentin  and prednisone   August 2025- None November 20025- No recent abx for AECOPD or prednisone ; on daily azithromyvin MWF.   No Known Allergies  Immunization History  Administered Date(s) Administered   Influenza Split 09/20/2011, 09/12/2012, 08/12/2013   Influenza, Seasonal, Injecte, Preservative Fre 08/07/2023   Influenza,inj,Quad PF,6+ Mos 08/03/2014, 08/12/2015, 08/12/2017, 07/29/2018, 09/11/2021   Influenza,inj,Quad PF,6-35 Mos 09/11/2019   PFIZER(Purple Top)SARS-COV-2 Vaccination 02/05/2020, 02/25/2020, 11/08/2020   PNEUMOCOCCAL CONJUGATE-20 06/30/2024   Pneumococcal Conjugate-13 09/24/2017   Pneumococcal Polysaccharide-23 11/12/2009    Past Medical History:  Diagnosis  Date   Anemia    chronis    Anxiety    Arthritis    CAD (coronary artery disease), native coronary artery 02/2024   Coronary CTA showed coronary Ca score of 57 and <25% stenosis of prox-mid RCA and LAD and prox LCx   Cervical cancer (HCC) 1990   cervical    Chronic diastolic CHF (congestive heart failure) (HCC) 08/10/2016   COPD (chronic obstructive pulmonary disease) (HCC)    O2 dependent. Pulmo: Dr. Shelah   Diabetes mellitus without complication (HCC)    a. A1c 8.3 in 11/2015   Emphysema    Gallstones    s/p cholecystectomy   GERD (gastroesophageal reflux disease)    Gout    Hx of cardiac catheterization    a. LHC at [facility] in [place] , [place] 09/2008:  Normal coronary arteries EF 70%. b. LHC 11/2016: normal cors, normal LVEDP, EF 55-65%.   Hyperlipidemia    Hypertension    Hypoxia 11/17/2018   Morbid obesity (HCC)    On home oxygen  therapy    2.5L; 24/7 (08/10/2016)   OSA on CPAP    CPAP at night    Pneumonia    Sickle cell trait    Tobacco abuse    a. up to 3ppd from age 34 to 65, now 1/4 ppd (01/2013) >> Quit 10/2015    Tobacco History: Social History   Tobacco Use  Smoking Status Former   Current packs/day: 0.00   Average packs/day: 3.0 packs/day for 36.0 years (108.0 ttl pk-yrs)   Types: Cigarettes   Start date: 11/11/1979   Quit date: 11/11/2015   Years since quitting: 8.9  Smokeless Tobacco Never  Tobacco Comments   Approx 90 pk-yrs (up to 3ppd until ~ 2009).   Counseling given: Not Answered Tobacco comments: Approx 90 pk-yrs (up to 3ppd until ~ 2009).   Outpatient Medications Prior to Visit  Medication Sig Dispense Refill   albuterol  (PROVENTIL ) (2.5 MG/3ML) 0.083% nebulizer solution USE 1 VIAL in NEBULIZER 4 TIMES DAILY AS NEEDED. 300 mL 4   albuterol  (VENTOLIN  HFA) 108 (90 Base) MCG/ACT inhaler Inhale 2 puffs into the lungs every 4 (four) hours as needed for wheezing or shortness of breath. 18 g 3   allopurinol  (ZYLOPRIM ) 100 MG tablet Take 100 mg by mouth daily.     ALPRAZolam   (XANAX ) 1 MG tablet Take 1 mg by mouth daily as needed for anxiety.  0   aspirin  EC 81 MG tablet Take 81 mg by mouth daily.     azelastine  (ASTELIN ) 0.1 % nasal spray USE 2 SPRAYS IN EACH NOSTRIL TWICE DAILY 30 mL 12   cephALEXin (KEFLEX) 500 MG capsule Take 500 mg by mouth 2 (two) times daily.     cetirizine (ZYRTEC) 10 MG tablet Take 10 mg by mouth daily.     Cholecalciferol  (VITAMIN D  PO) Take 20 mcg by mouth daily. 10 mcg each     clobetasol  cream (TEMOVATE ) 0.05 % Apply 1 application topically daily as needed (irritation from pull ups).   0   ferrous sulfate 324 MG TBEC Take 324 mg by mouth 2 (two) times a week.     furosemide  (LASIX ) 40 MG tablet Take 40 mg (1 tablet) by mouth in the morning and 20 mg (1/2 tablet) by mouth in the evening 135 tablet 3   gabapentin  (NEURONTIN ) 300 MG capsule Take 300 mg by mouth 3 (three) times daily as needed (Pain).     HYDROcodone -acetaminophen  (NORCO) 10-325 MG tablet Take 1  tablet by mouth every 4 (four) hours as needed for moderate pain (pain score 4-6).     lubiprostone  (AMITIZA ) 24 MCG capsule Take 24 mcg by mouth 2 (two) times daily.     metoprolol  succinate (TOPROL -XL) 50 MG 24 hr tablet TAKE ONE TABLET BY MOUTH EVERY MORNING FOR BLOOD PRESSURE 90 tablet 2   metoprolol  tartrate (LOPRESSOR ) 100 MG tablet Take 1 tablet (100mg ) TWO hours prior to CT scan 1 tablet 0   OXYGEN  Inhale 3 L into the lungs continuous.     OZEMPIC, 2 MG/DOSE, 8 MG/3ML SOPN Inject 2 mg into the skin once a week.     pantoprazole  (PROTONIX ) 40 MG tablet Take 1 tablet (40 mg total) by mouth 2 (two) times daily. 180 tablet 3   potassium chloride  SA (KLOR-CON  M) 20 MEQ tablet TAKE ONE TABLET BY MOUTH EVERY MORNING 90 tablet 2   Respiratory Therapy Supplies (FLUTTER) DEVI 1 Device by Does not apply route 3 (three) times daily. 1 each 0   roflumilast  (DALIRESP ) 500 MCG TABS tablet TAKE ONE TABLET BY MOUTH EVERY MORNING 30 tablet 6   spironolactone  (ALDACTONE ) 25 MG tablet Take 0.5  tablets (12.5 mg total) by mouth daily. 15 tablet 12   Tetrahydroz-Dextran-PEG-Povid (ADVANCED FORMULA EYE DROPS OP) Place 1-2 drops into both eyes daily as needed (Dry eyes).     TRELEGY ELLIPTA  200-62.5-25 MCG/ACT AEPB INHALE 1 PUFF BY MOUTH EVERY DAY 60 each 5   zolpidem  (AMBIEN ) 10 MG tablet Take 10 mg by mouth at bedtime.     azithromycin  (ZITHROMAX ) 250 MG tablet Take 1 tablet (250 mg total) by mouth every Monday, Wednesday, and Friday. (Patient not taking: Reported on 10/06/2024) 90 tablet 2   No facility-administered medications prior to visit.   Review of Systems  Review of Systems  Respiratory:  Negative for shortness of breath.        Occasional cough   Physical Exam  BP 124/60   Pulse 79   Temp (!) 97.3 F (36.3 C)   Ht 5' (1.524 m) Comment: pt stated  Wt 140 lb 6.4 oz (63.7 kg)   LMP 06/04/2000 Comment: tubal ligation  SpO2 98% Comment: 2l o2 cont  BMI 27.42 kg/m  Physical Exam Constitutional:      Appearance: Normal appearance.  HENT:     Head: Normocephalic and atraumatic.  Cardiovascular:     Rate and Rhythm: Normal rate and regular rhythm.  Pulmonary:     Effort: Pulmonary effort is normal.     Breath sounds: Normal breath sounds.  Skin:    General: Skin is warm and dry.  Neurological:     General: No focal deficit present.     Mental Status: She is alert and oriented to person, place, and time. Mental status is at baseline.  Psychiatric:        Mood and Affect: Mood normal.        Behavior: Behavior normal.        Thought Content: Thought content normal.        Judgment: Judgment normal.      Lab Results:  CBC    Component Value Date/Time   WBC 6.6 09/28/2024 1007   RBC 3.76 (L) 09/28/2024 1007   HGB 10.6 (L) 09/28/2024 1007   HGB 12.3 05/10/2020 1004   HCT 31.2 (L) 09/28/2024 1007   HCT 36.9 05/10/2020 1004   PLT 317 09/28/2024 1007   PLT 193 05/10/2020 1004   MCV 83.0 09/28/2024 1007  MCV 86 05/10/2020 1004   MCH 28.2 09/28/2024  1007   MCHC 34.0 09/28/2024 1007   RDW 12.7 09/28/2024 1007   RDW 15.6 (H) 05/10/2020 1004   LYMPHSABS 2.3 07/25/2023 1830   MONOABS 0.6 07/25/2023 1830   EOSABS 0.0 07/25/2023 1830   BASOSABS 0.1 07/25/2023 1830    BMET    Component Value Date/Time   NA 137 09/28/2024 1007   NA 145 (H) 12/19/2022 1126   K 3.8 09/28/2024 1007   CL 96 (L) 09/28/2024 1007   CO2 30 09/28/2024 1007   GLUCOSE 99 09/28/2024 1007   BUN 16 09/28/2024 1007   BUN 10 12/19/2022 1126   CREATININE 0.85 09/28/2024 1007   CALCIUM  10.0 09/28/2024 1007   GFRNONAA >60 09/28/2024 1007   GFRAA 69 05/10/2020 1004    BNP    Component Value Date/Time   BNP 10.8 07/25/2023 1830    ProBNP    Component Value Date/Time   PROBNP <36 02/06/2024 1116   PROBNP 11.0 03/15/2022 0941    Imaging: DG Chest 2 View Result Date: 09/28/2024 CLINICAL DATA:  Chest pain and shortness of breath. EXAM: CHEST - 2 VIEW COMPARISON:  07/25/2023.  CT chest 02/12/2024. FINDINGS: Trachea is midline. Heart size stable. Thoracic aorta is calcified. No airspace consolidation or pleural fluid. Minimal scarring in the lung bases. Degenerative changes in the spine. Old rib fractures. IMPRESSION: No acute findings. Electronically Signed   By: Newell Eke M.D.   On: 09/28/2024 10:22     Assessment & Plan:   No problem-specific Assessment & Plan notes found for this encounter.   1. Chronic obstructive pulmonary disease, unspecified COPD type (HCC) (Primary)  2. Chronic respiratory failure with hypoxia (HCC)  3. Pulmonary nodule   Assessment and Plan Assessment & Plan Chronic obstructive pulmonary disease with chronic respiratory failure and hypoxia COPD with chronic respiratory failure and hypoxia, managed with Trelegy, Daliresp , and azithromycin . EKG normal, safe to continue azithromycin . Participating in pulmonary rehab, beneficial. No recent exacerbations or need for prednisone . Oxygen  therapy at 2 liters, increased to 3  liters during pulmonary rehab as needed. No wheezing, lungs clear. - Continue Trelegy, Daliresp , and azithromycin . - Continue pulmonary rehab. - Maintain oxygen  therapy at 2 liters, increase to 3 liters as needed. - Ensure rescue inhaler is available and use as needed. - Avoid strong perfumes and colognes; wear a mask if necessary.  Urinary tract infection UTI with urinary symptoms, treated with Keflex 500 mg. Symptoms improving. - Continue Keflex 500 mg as prescribed.  Chest pain, likely musculoskeletal Intermittent chest pain, likely musculoskeletal, possibly related to physical activity at pulmonary rehab. Pain has eased off. No cardiac cause identified in recent ED workup. - Consider prednisone  if symptoms worsen.  Solitary pulmonary nodule LDCT in April>> LUNG RADS 2. CT scan showed benign nodules and airway debris, possibly related to aspiration. No recent episodes of choking or gagging. - Continue to follow up with lung cancer screening program   Gastroesophageal reflux disease GERD managed with pantoprazole . No recent exacerbations. - Continue pantoprazole .  Obesity, improved with weight loss Obesity improved with weight loss of 30-40 pounds. Weight loss attributed to medication and lifestyle changes. - Continue current management and lifestyle modifications.    Almarie LELON Ferrari, NP 10/26/2024

## 2025-01-29 ENCOUNTER — Ambulatory Visit: Admitting: Primary Care

## 2025-03-31 ENCOUNTER — Ambulatory Visit: Admitting: Cardiology
